# Patient Record
Sex: Female | Born: 1940 | Race: Black or African American | Hispanic: No | Smoking: Former smoker
Health system: Southern US, Community
[De-identification: ages and names within clinical notes are randomized; demographics above are authoritative.]

## PROBLEM LIST (undated history)

## (undated) DIAGNOSIS — N183 Chronic kidney disease, stage 3 unspecified: Secondary | ICD-10-CM

## (undated) DIAGNOSIS — E785 Hyperlipidemia, unspecified: Secondary | ICD-10-CM

## (undated) DIAGNOSIS — R011 Cardiac murmur, unspecified: Secondary | ICD-10-CM

## (undated) DIAGNOSIS — I4891 Unspecified atrial fibrillation: Secondary | ICD-10-CM

## (undated) DIAGNOSIS — Z992 Dependence on renal dialysis: Secondary | ICD-10-CM

## (undated) DIAGNOSIS — J449 Chronic obstructive pulmonary disease, unspecified: Secondary | ICD-10-CM

## (undated) DIAGNOSIS — M1990 Unspecified osteoarthritis, unspecified site: Secondary | ICD-10-CM

## (undated) DIAGNOSIS — N289 Disorder of kidney and ureter, unspecified: Secondary | ICD-10-CM

## (undated) DIAGNOSIS — I10 Essential (primary) hypertension: Secondary | ICD-10-CM

## (undated) DIAGNOSIS — Z87442 Personal history of urinary calculi: Secondary | ICD-10-CM

## (undated) DIAGNOSIS — L409 Psoriasis, unspecified: Secondary | ICD-10-CM

## (undated) DIAGNOSIS — I509 Heart failure, unspecified: Secondary | ICD-10-CM

## (undated) DIAGNOSIS — Z905 Acquired absence of kidney: Secondary | ICD-10-CM

## (undated) DIAGNOSIS — G4730 Sleep apnea, unspecified: Secondary | ICD-10-CM

## (undated) HISTORY — PX: CHOLECYSTECTOMY: SHX55

## (undated) HISTORY — PX: ABDOMINAL HYSTERECTOMY: SHX81

---

## 2000-02-20 ENCOUNTER — Inpatient Hospital Stay (HOSPITAL_COMMUNITY): Admission: EM | Admit: 2000-02-20 | Discharge: 2000-02-21 | Payer: Self-pay | Admitting: Emergency Medicine

## 2000-02-20 ENCOUNTER — Encounter: Payer: Self-pay | Admitting: Emergency Medicine

## 2007-03-27 ENCOUNTER — Encounter (INDEPENDENT_AMBULATORY_CARE_PROVIDER_SITE_OTHER): Payer: Self-pay | Admitting: Internal Medicine

## 2007-03-27 ENCOUNTER — Ambulatory Visit: Payer: Self-pay | Admitting: Family Medicine

## 2007-03-27 LAB — CONVERTED CEMR LAB
ALT: 17 units/L (ref 0–35)
AST: 15 units/L (ref 0–37)
Albumin: 3.7 g/dL (ref 3.5–5.2)
Alkaline Phosphatase: 127 units/L — ABNORMAL HIGH (ref 39–117)
BUN: 16 mg/dL (ref 6–23)
Basophils Absolute: 0 10*3/uL (ref 0.0–0.1)
Basophils Relative: 0 % (ref 0–1)
CO2: 24 meq/L (ref 19–32)
Calcium: 9.2 mg/dL (ref 8.4–10.5)
Chloride: 108 meq/L (ref 96–112)
Cholesterol: 205 mg/dL — ABNORMAL HIGH (ref 0–200)
Creatinine, Ser: 0.85 mg/dL (ref 0.40–1.20)
Eosinophils Absolute: 0.1 10*3/uL (ref 0.0–0.7)
Eosinophils Relative: 1 % (ref 0–5)
Glucose, Bld: 169 mg/dL — ABNORMAL HIGH (ref 70–99)
HCT: 46.9 % — ABNORMAL HIGH (ref 36.0–46.0)
HDL: 79 mg/dL (ref >39)
Hemoglobin: 15 g/dL (ref 12.0–15.0)
LDL Cholesterol: 97 mg/dL (ref 0–99)
Lymphocytes Relative: 21 % (ref 12–46)
Lymphs Abs: 2.1 10*3/uL (ref 0.7–4.0)
MCHC: 32 g/dL (ref 30.0–36.0)
MCV: 92 fL (ref 78.0–100.0)
Monocytes Absolute: 0.5 10*3/uL (ref 0.1–1.0)
Monocytes Relative: 5 % (ref 3–12)
Neutro Abs: 7.1 10*3/uL (ref 1.7–7.7)
Neutrophils Relative %: 72 % (ref 43–77)
Platelets: 302 10*3/uL (ref 150–400)
Potassium: 4.9 meq/L (ref 3.5–5.3)
RBC: 5.1 M/uL (ref 3.87–5.11)
RDW: 15 % (ref 11.5–15.5)
Sodium: 144 meq/L (ref 135–145)
TSH: 0.987 microintl units/mL (ref 0.350–5.50)
Total Bilirubin: 0.6 mg/dL (ref 0.3–1.2)
Total CHOL/HDL Ratio: 2.6
Total Protein: 6.8 g/dL (ref 6.0–8.3)
Triglycerides: 147 mg/dL (ref <150)
VLDL: 29 mg/dL (ref 0–40)
WBC: 9.9 10*3/uL (ref 4.0–10.5)

## 2007-03-28 ENCOUNTER — Ambulatory Visit: Payer: Self-pay | Admitting: *Deleted

## 2007-05-17 ENCOUNTER — Ambulatory Visit: Payer: Self-pay | Admitting: Internal Medicine

## 2007-05-17 LAB — CONVERTED CEMR LAB
ALT: 15 units/L (ref 0–35)
AST: 14 units/L (ref 0–37)
Albumin: 3.6 g/dL (ref 3.5–5.2)
Alkaline Phosphatase: 116 units/L (ref 39–117)
BUN: 12 mg/dL (ref 6–23)
CO2: 25 meq/L (ref 19–32)
Calcium: 9.5 mg/dL (ref 8.4–10.5)
Chloride: 103 meq/L (ref 96–112)
Creatinine, Ser: 0.81 mg/dL (ref 0.40–1.20)
Glucose, Bld: 134 mg/dL — ABNORMAL HIGH (ref 70–99)
Potassium: 4.4 meq/L (ref 3.5–5.3)
Sodium: 143 meq/L (ref 135–145)
Total Bilirubin: 0.6 mg/dL (ref 0.3–1.2)
Total Protein: 7.1 g/dL (ref 6.0–8.3)

## 2007-07-12 ENCOUNTER — Ambulatory Visit: Payer: Self-pay | Admitting: Internal Medicine

## 2007-08-04 ENCOUNTER — Ambulatory Visit: Payer: Self-pay | Admitting: Internal Medicine

## 2007-10-16 ENCOUNTER — Encounter: Payer: Self-pay | Admitting: Family Medicine

## 2007-10-16 ENCOUNTER — Ambulatory Visit: Payer: Self-pay | Admitting: Internal Medicine

## 2007-10-16 LAB — CONVERTED CEMR LAB
ALT: 10 units/L (ref 0–35)
AST: 12 units/L (ref 0–37)
Albumin: 3.3 g/dL — ABNORMAL LOW (ref 3.5–5.2)
Alkaline Phosphatase: 74 units/L (ref 39–117)
BUN: 17 mg/dL (ref 6–23)
CO2: 25 meq/L (ref 19–32)
Calcium: 9.1 mg/dL (ref 8.4–10.5)
Chloride: 106 meq/L (ref 96–112)
Creatinine, Ser: 0.99 mg/dL (ref 0.40–1.20)
Glucose, Bld: 96 mg/dL (ref 70–99)
Potassium: 4.7 meq/L (ref 3.5–5.3)
Sodium: 142 meq/L (ref 135–145)
Total Bilirubin: 0.3 mg/dL (ref 0.3–1.2)
Total Protein: 6.4 g/dL (ref 6.0–8.3)
Uric Acid, Serum: 4.8 mg/dL (ref 2.4–7.0)

## 2007-12-25 ENCOUNTER — Ambulatory Visit: Payer: Self-pay | Admitting: Family Medicine

## 2008-04-09 ENCOUNTER — Ambulatory Visit (HOSPITAL_COMMUNITY): Admission: RE | Admit: 2008-04-09 | Discharge: 2008-04-09 | Payer: Self-pay | Admitting: Cardiology

## 2008-04-09 ENCOUNTER — Encounter (INDEPENDENT_AMBULATORY_CARE_PROVIDER_SITE_OTHER): Payer: Self-pay | Admitting: Cardiology

## 2008-05-10 ENCOUNTER — Encounter: Admission: RE | Admit: 2008-05-10 | Discharge: 2008-05-10 | Payer: Self-pay | Admitting: Cardiology

## 2008-05-16 ENCOUNTER — Inpatient Hospital Stay (HOSPITAL_BASED_OUTPATIENT_CLINIC_OR_DEPARTMENT_OTHER): Admission: RE | Admit: 2008-05-16 | Discharge: 2008-05-16 | Payer: Self-pay | Admitting: Cardiology

## 2009-06-23 ENCOUNTER — Encounter: Admission: RE | Admit: 2009-06-23 | Discharge: 2009-06-23 | Payer: Self-pay | Admitting: Family Medicine

## 2009-09-15 ENCOUNTER — Encounter: Payer: Self-pay | Admitting: Pulmonary Disease

## 2009-09-15 ENCOUNTER — Encounter: Admission: RE | Admit: 2009-09-15 | Discharge: 2009-09-15 | Payer: Self-pay | Admitting: Cardiology

## 2009-10-03 ENCOUNTER — Encounter: Payer: Self-pay | Admitting: Pulmonary Disease

## 2009-10-03 ENCOUNTER — Ambulatory Visit: Payer: Self-pay | Admitting: Internal Medicine

## 2009-10-06 ENCOUNTER — Encounter: Payer: Self-pay | Admitting: Internal Medicine

## 2009-10-23 ENCOUNTER — Telehealth (INDEPENDENT_AMBULATORY_CARE_PROVIDER_SITE_OTHER): Payer: Self-pay | Admitting: *Deleted

## 2009-11-11 DIAGNOSIS — I119 Hypertensive heart disease without heart failure: Secondary | ICD-10-CM | POA: Insufficient documentation

## 2009-11-11 DIAGNOSIS — M109 Gout, unspecified: Secondary | ICD-10-CM | POA: Insufficient documentation

## 2009-11-11 DIAGNOSIS — E669 Obesity, unspecified: Secondary | ICD-10-CM | POA: Insufficient documentation

## 2009-11-11 DIAGNOSIS — Z8719 Personal history of other diseases of the digestive system: Secondary | ICD-10-CM | POA: Insufficient documentation

## 2009-11-12 ENCOUNTER — Ambulatory Visit: Payer: Self-pay | Admitting: Pulmonary Disease

## 2009-11-12 DIAGNOSIS — R0609 Other forms of dyspnea: Secondary | ICD-10-CM | POA: Insufficient documentation

## 2009-11-12 DIAGNOSIS — R0989 Other specified symptoms and signs involving the circulatory and respiratory systems: Secondary | ICD-10-CM | POA: Insufficient documentation

## 2009-11-12 DIAGNOSIS — J449 Chronic obstructive pulmonary disease, unspecified: Secondary | ICD-10-CM | POA: Insufficient documentation

## 2009-12-02 ENCOUNTER — Encounter: Admission: RE | Admit: 2009-12-02 | Discharge: 2009-12-02 | Payer: Self-pay | Admitting: Cardiology

## 2009-12-26 ENCOUNTER — Encounter: Admission: RE | Admit: 2009-12-26 | Discharge: 2009-12-26 | Payer: Self-pay | Admitting: Family Medicine

## 2010-03-15 ENCOUNTER — Encounter: Payer: Self-pay | Admitting: Family Medicine

## 2010-03-24 NOTE — Progress Notes (Signed)
Summary: fax pft's  Phone Note From Other Clinic Call back at [phone]   Caller: Dr. Dineen Kid Call For: young Summary of Call: Fax # [phone] please fax copy of PFT's to [facility] at Dr. Thurman Coyer Office. Initial call taken by: Zigmund Gottron,  October 23, 2009 1:49 PM  Follow-up for Phone Call        Faxed pfts.//Juanita Follow-up by: Netta Neat,  October 24, 2009 8:13 AM

## 2010-03-24 NOTE — Miscellaneous (Signed)
Summary: Orders Update pft charges  Clinical Lists Changes  Orders: Added new Service order of Carbon Monoxide diffusing w/capacity (94720) - Signed Added new Service order of Lung Volumes (94240) - Signed Added new Service order of Spirometry (Pre & Post) (94060) - Signed 

## 2010-03-24 NOTE — Assessment & Plan Note (Signed)
Summary: consult for doe   Visit Type:  Initial Consult Copy to:  Tollie Eth MD Primary Provider/Referring Provider:  Esperanza Richters  CC:  pulmonary consult.Marland Kitchen  History of Present Illness: The pt is a 70y/o female who I have been asked to see for doe.  She has been having increased sob over the past 36mos, and has gotten to the point that she will get winded just walking to her mailbox at home.  She will also get winded bringing her groceries in from the car.  She denies gasping for breath, but rather feels she has a "tiredness in her chest" and "feels heavy".  She denies any significant cough or mucus production, and tells me these symptoms resolved once she stopped smoking.  She has had recent cxr that only shows a linear scar in the lingula.  She has had recent pfts last month that I have reviewed, and reveal a normal FEV1%, but mild obstruction on the basis of airtrapping noted on the lung volumes.  She had mild restriction, and a moderate decrease in DLCO that totally corrects with Av.  The pt has been on advair for years, but has never had pfts in the past.  She has never felt it did much for her.  She has known htn with diastolic dysfunction, and an echo last year showed no pulmonary htn.  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  She denies any LE edema of significance.  She does have significant knee and back issues which limit her mobility quite a bit.  Current Medications (verified): 1)  Advair Diskus 250-50 Mcg/dose Aepb (Fluticasone-Salmeterol) .... 2 Inhalation Two Times A Day 2)  Pravastatin Sodium 80 Mg Tabs (Pravastatin Sodium) .... Once Daily 3)  Lasix 40 Mg Tabs (Furosemide) .Marland Kitchen.. 1 Tab By Mouth Once Daily 4)  Micardis 80 Mg Tabs (Telmisartan) .Marland Kitchen.. 1 Tab By Mouth Once Daily 5)  Spironolactone 25 Mg Tabs (Spironolactone) .Marland Kitchen.. 1 Tab By Mouth Once Daily 6)  Metoprolol Succinate 25 Mg Xr24h-Tab (Metoprolol Succinate) .Marland Kitchen.. 1 Tab By Mouth Once Daily 7)  Amlodipine  Besylate 10 Mg Tabs (Amlodipine Besylate) .... Once Daily 8)  Metformin Hcl 500 Mg Tabs (Metformin Hcl) .... 2 Tab By Mouth Two Times A Day 9)  Meloxicam 15 Mg Tabs (Meloxicam) .... As Needed 10)  Metaxalone 800 Mg Tabs (Metaxalone) .... As Needed 11)  Proair Hfa 108 (90 Base) Mcg/act Aers (Albuterol Sulfate) .... As Needed 12)  Glipizide 2.5 Mg Xr24h-Tab (Glipizide) .... Once Daily  Allergies (verified): 1)  ! 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 DM (ICD-250.00) BEN HTN HEART DISEASE WITHOUT HEART FAIL (ICD-402.10)     Past Surgical History: removal of gallstones  Family History: Reviewed history and no changes required. Theadora Rama cancer--mother  Social History: Reviewed history from 11/11/2009 and no changes required. Patient states former smoker. Quit Jan 2011. 1/2-1 ppd.  Occupation--retired--nurse aide Single lives alone children--2 Currently drinks 1-2 beers a week. use to drink 2-3 beers a day unitl feb 2011.  Review of Systems       The patient complains of shortness of breath with activity, indigestion, loss of appetite, abdominal pain, depression, and hand/feet swelling.  The patient denies shortness of breath at rest, productive cough, non-productive cough, coughing up blood, chest pain, irregular heartbeats, acid heartburn, weight  change, difficulty swallowing, sore throat, tooth/dental problems, headaches, nasal congestion/difficulty breathing through nose, sneezing, itching, ear ache, anxiety, joint stiffness or pain, rash, change in color of mucus, and fever.    Vital Signs:  Patient profile:   70 year old female Height:      62 inches Weight:      189.38 pounds BMI:     34.76 O2 Sat:      99 % on Room air Temp:     98.2 degrees F oral Pulse  rate:   64 / minute BP sitting:   146 / 74  (left arm)  Vitals Entered By: Charma Igo (November 12, 2009 10:02 AM)  O2 Flow:  Room air CC: pulmonary consult. Comments meds and allergies updated Phone number updated Charma Igo  November 12, 2009 10:02 AM    Physical Exam  General:  obese female in nad Eyes:  PERRLA and EOMI.   Nose:  mild septal deviation to left, no purulence or discharge Mouth:  moderate elongation of soft palate and uvula, no exudates or thrush Neck:  no jvd, tmg, LN Lungs:  clear to auscultation, no wheezing or rhonchi Heart:  rrr, no mrg Abdomen:  soft, nontender, bs+ centripetal obesity noted with encroachment on thoracic cage. Extremities:  mild edema on right, no cyanosis  pulses intact distally, but diminished. Neurologic:  alert and oriented, moves all 4.   Impression & Recommendations:  Problem # 1:  COPD, MILD (ICD-496) the pt has very mild copd primarily manifested as airtrapping.  She has been on advair for years, and really hasn't appreciated that it helps.  Since her primary issue on pfts appeared to be airtrapping, would like to use spiriva which targets this pathophysiology well.   If she sees no difference with this, would probably just use as needed albuterol rather than maintenance medications, given the mild nature of her obstruction.  Problem # 2:  DYSPNEA ON EXERTION (ICD-786.09)  the pt has multifactorial dyspnea that is related to her very mild copd, diastolic dysfunction, obesity and conditioning, and finally debility.  She has mild restriction on lung volumes that I think is related to her centripetal obesity given her cxr without evidence for ISLD.  She will need to work on weight loss and conditioning.  It is unclear how much improvement she may have with spiriva, but will try.  Medications Added to Medication List This Visit: 1)  Pravastatin Sodium 80 Mg Tabs (Pravastatin sodium) .... Once daily 2)  Glipizide 2.5 Mg Xr24h-tab  (Glipizide) .... Once daily  Other Orders: Consultation Level IV [id number])  Patient Instructions: 1)  stop advair 2)  start spiriva one inhalation each am 3)  work on weight loss and conditioning. 4)  followup with me in 4 weeks.

## 2010-06-04 LAB — POCT I-STAT GLUCOSE
Glucose, Bld: 105 mg/dL — ABNORMAL HIGH (ref 70–99)
Operator id: 221371

## 2010-07-07 NOTE — Cardiovascular Report (Signed)
NAMEYOSSELYN, STUTE              ACCOUNT NO.:  [id number]   MEDICAL RECORD NO.:  [id number]          PATIENT TYPE:  OIB   LOCATION:  1966                         FACILITY:  [facility]   PHYSICIAN:  Ezzard Standing, M.D.DATE OF BIRTH:  [birth year]   DATE OF PROCEDURE:  DATE OF DISCHARGE:  05/16/2008                            CARDIAC CATHETERIZATION   HISTORY:  A 70 year old female who has a history of diabetes,  hypertensive heart disease, and presented with dyspnea.  Her blood  pressure was poorly controlled.  She had an EKG suggestive of a previous  anterior infarction and in addition had an abnormal Cardiolite with  suggestion of a previous anterior infarction with superior infarction  and ischemia.   PROCEDURE:  Left heart catheterization with coronary angiograms and left  ventriculogram.   DESCRIPTION OF PROCEDURE:  The right femoral artery was entered and the  procedure was done through a 4-French sheath.  Coronary arteries were  visualized with 4-French catheters and a 30 mL ventriculogram was  performed.  She was given labetalol 20 mg IV for control of blood  pressure.  Following the procedure, her sheath was removed in the  holding area.   HEMODYNAMIC DATA:  Aorta postcontrast is 200/85.  LV postcontrast 200/22-  30.   ANGIOGRAPHIC DATA:  Left ventriculogram:  Performed in the 30-degrees  RAO projection.  The aortic valve appears normal.  The mitral valve  appears normal.  The left ventricle has increased trabeculations  consistent with ventricular hypertrophy.  Estimated ejection fraction is  55%.  Coronary arteries arise and distribute normally.  The system is  left dominant with the LAD being small and the right coronary artery  being small and the circumflex being the dominant vessel.  The left main  coronary artery appears short, but appears normal.  The left anterior  descending is a very small vessel and contains 2 small branches and  terminates at the distal  anterior wall and a septal perforator arises  off of it.  There is a small intermediate branch noted.  The circumflex  coronary artery is a dominant vessel.  There is a very large marginal  system which appears to supply the apex.  The vessel contains scattered  irregularities.  The right coronary artery is a small nondominant vessel  and supplies mainly right ventricular branches.   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   RECOMMENDATIONS:  May proceed with surgery from the cardiovascular  viewpoint.  She needs to have very good control of blood pressure.      Ezzard Standing, M.D.  Electronically Signed    WST/MEDQ  D:  05/16/2008  T:  05/17/2008  Job:  [id number]   cc:   Tamika J. Lavonia Drafts, M.D.

## 2010-07-10 NOTE — Discharge Summary (Signed)
[place]  Patient:    Stephanie Woodward, Stephanie Woodward                       MRN: [id number] Adm. Date:  [id number] Disc. Date: [id number] Attending:  Thomes Lolling                           Discharge Summary  DISCHARGE DIAGNOSES: 1. Pancreatitis. 2. Ruled out myocardial infarction. 3. Hypertension. 4. Hypercholesterolemia. 5. Hyperglycemia.  DISCHARGE MEDICATIONS: 1. Prinzide one p.o. q.d. 2. Potassium 20 mEq p.o. q.d.  CONSULTATIONS:  None.  PROCEDURE:  None.  BRIEF HISTORY AND PHYSICAL:  Stephanie Woodward is a 70 year old African-American female with a history of hypertension and diet controlled elevated cholesterol and hyperglycemia who presents with a six hour history of left-sided chest pain and left flank pain.  The patient was at rest while she developed an acute onset of dull left-sided pain under her breasts which radiated to the epigastrium and left flank.  The pain is constant and has lasted for six hours.  It is not associated with nausea, vomiting, or diaphoresis.  She did initially have some shortness of breath but this resolved after several minutes.  However, the pain has persisted.  She has no prior episode of this sort of pain.  She has no known history of coronary disease.  She does report a history of eating fried foods and a lot of rich foods around the holidays, and especially in the last couple of days since she has been visiting her daughter.  There is no family history of coronary disease but she is a smoker of about a half pack a day.  PAST MEDICAL HISTORY: 1. Hypertension x 10 years. 2. Psoriasis. 3. Hypercholesterolemia, diet controlled. 4. Hyperglycemia also diet controlled.  MEDICATIONS:  Prinzide, potassium, ibuprofen p.r.n.  ALLERGIES:  PENICILLIN causes nausea.  FAMILY HISTORY:  Positive for diabetes, negative for hypertension or coronary disease.  Her mother is alive at 1 with colon cancer.  Father died in  32s secondary to complications of diabetes.  SOCIAL HISTORY:  The patient lives in [place], [place] with her husband.  She smokes half a pack a day x 20 years.  Occasional alcohol which would consist of beer or liquor drink on a special occasion.  There is no illicit drug use. She works for the [employer].  REVIEW OF SYSTEMS:  Positive for chills for the last week.  These chills are described as cold spells alternating with sweats that can last for hours. They have been going on for about a week.  There are no weight changes.  She also reports a runny nose for about one day and occasional headache. Otherwise review of systems is noncontributory.  PHYSICAL EXAMINATION:  VITAL SIGNS:  Temperature 98.8, heart rate 66, blood pressure 177/95-on recheck 125/60, respirations 20, 98% on room air.  GENERAL:  She is an overweight African-American female, pleasant, interactive, in no acute distress.  HEENT:  Oropharynx is clear without exudate.  NECK:  No lymphadenopathy.  No thyromegaly.  No carotid bruits.  CARDIOVASCULAR:  Regular rate and rhythm.  No murmurs, rubs, or gallops. There is no JVD.  She has 2+ palpable pulses x 4.  RESPIRATORY:  Clear to auscultation bilaterally except for a slight wheeze at the bilateral bases which is cleared by cough.  GASTROINTESTINAL:  Positive bowel sounds, soft, mildly tender epigastrium without rebound or guarding.  There  is no hepatosplenomegaly, no mass.  EXTREMITIES:  No cyanosis, clubbing, or edema.  She does have psoriatic plaques on her upper and lower extremities.  NEUROLOGICAL:  She is awake and oriented x 3 and nonfocal.  LABORATORIES:  Significant for cardiac enzymes which are negative x 3 except for a mildly elevated CK, the highest of 346 on admission and was 231 on discharge.  However, MB was all within normal range and troponin Is were nondetectable.  Lipase was 62 which was slightly elevated.  Her EKG revealed  normal sinus rhythm with an occasional PAC and an inverted T-wave in V1 as well as left atrial enlargement.  There is no ST elevation or depression.  Chest x-ray reveals cardiac enlargement with some mild vascular congestion.  HOSPITAL COURSE:  Patient was admitted to telemetry, ruled out for MI by enzymes.  There were no EKG changes.  Telemetry revealed normal sinus rhythm. Pain was relieved immediately with Mylanta 30 cc p.o. and we started her on a clear liquid diet.  She tolerated this fine and we advanced her to regular diet.  On day #2 of her admission patient requested to go home to [place], feeling that her episode had resolved.  Since she was tolerating a regular diet without any pain, we felt that her mild case of pancreatitis had resolved and warned her of eating fried or fatty foods and staying away from alcohol. The patient should also avoid any strenuous activity for the next few days. She does have a follow-up appointment already arranged with her primary M.D. which is Dr. Dwain Sarna in [place], [place].  I plan to fax a copy of this discharge summary as well as any pertinent laboratory work which includes her fasting lipid profile which is still pending at the time of this dictation. Patient will follow up with her primary M.D. tomorrow on Monday. DD:  02/21/00 TD:  02/21/00 Job: [id number]

## 2011-01-18 DIAGNOSIS — E119 Type 2 diabetes mellitus without complications: Secondary | ICD-10-CM | POA: Insufficient documentation

## 2011-01-18 DIAGNOSIS — L0201 Cutaneous abscess of face: Secondary | ICD-10-CM | POA: Insufficient documentation

## 2011-01-18 DIAGNOSIS — I10 Essential (primary) hypertension: Secondary | ICD-10-CM | POA: Insufficient documentation

## 2011-01-18 DIAGNOSIS — L03211 Cellulitis of face: Secondary | ICD-10-CM | POA: Insufficient documentation

## 2011-01-19 ENCOUNTER — Emergency Department (HOSPITAL_COMMUNITY)
Admission: EM | Admit: 2011-01-19 | Discharge: 2011-01-19 | Payer: Medicare Other | Attending: Emergency Medicine | Admitting: Emergency Medicine

## 2011-01-19 DIAGNOSIS — L0201 Cutaneous abscess of face: Secondary | ICD-10-CM

## 2011-01-19 HISTORY — DX: Essential (primary) hypertension: I10

## 2011-01-19 HISTORY — DX: Chronic obstructive pulmonary disease, unspecified: J44.9

## 2011-01-19 HISTORY — DX: Disorder of kidney and ureter, unspecified: N28.9

## 2011-01-19 HISTORY — DX: Hyperlipidemia, unspecified: E78.5

## 2011-01-19 MED ORDER — LIDOCAINE HCL 2 % IJ SOLN
INTRAMUSCULAR | Status: AC
  Filled 2011-01-19: qty 1

## 2011-01-19 MED ORDER — CLINDAMYCIN HCL 150 MG PO CAPS: 300.0000 mg | ORAL_CAPSULE | Freq: Four times a day (QID) | ORAL | Status: AC

## 2011-01-19 MED ORDER — LIDOCAINE-EPINEPHRINE 2 %-1:100000 IJ SOLN
20.0000 mL | Freq: Once | INTRAMUSCULAR | Status: AC
  Administered 2011-01-19: 20 mL

## 2011-01-19 MED ORDER — CLINDAMYCIN HCL 300 MG PO CAPS
300.0000 mg | ORAL_CAPSULE | Freq: Once | ORAL | Status: AC
  Administered 2011-01-19: 300 mg via ORAL
  Filled 2011-01-19: qty 1

## 2011-01-19 NOTE — ED Provider Notes (Signed)
History     CSN: [id number] Arrival date & time: 01/19/2011  2:08 AM   First MD Initiated Contact with Patient 01/19/11 [phone]      Chief Complaint  Patient presents with  . Facial Swelling    HPI  History provided by the patient and daughter. Patient presents with acute swelling to left face and lateral eyebrow area over the past 4 days.  Pt states having a chronic small skin "bump" to the area but has never has pain or swelling like she has now.  Area is very TTP.  Pt tried using warm cloth to area but has not had any improvement.  Pt did try to squeeze area without any drainage.  Pt denies vision problems.  She does have hx of cataract surgery in left eye.  Pt denies fever, chills, sweats.     Past Medical History  Diagnosis Date  . Diabetes mellitus   . Hypertension   . Asthma   . COPD (chronic obstructive pulmonary disease)   . Renal disorder   . Gout   . Hyperlipemia     History reviewed. No pertinent past surgical history.  History reviewed. No pertinent family history.  History  Substance Use Topics  . Smoking status: Not on file  . Smokeless tobacco: Not on file  . Alcohol Use: No    OB History    Grav Para Term Preterm Abortions TAB SAB Ect Mult Living                  Review of Systems  Constitutional: Negative for fever and chills.  Neurological: Negative for headaches.  All other systems reviewed and are negative.    Allergies  Penicillins  Home Medications   Current Outpatient Rx  Name Route Sig Dispense Refill  . ALBUTEROL SULFATE HFA 108 (90 BASE) MCG/ACT IN AERS Inhalation Inhale 2 puffs into the lungs every 6 (six) hours as needed.      Marland Kitchen AMLODIPINE BESY-BENAZEPRIL HCL 10-20 MG PO CAPS Oral Take 1 capsule by mouth daily.      . FUROSEMIDE 40 MG PO TABS Oral Take 40 mg by mouth 2 (two) times daily.      Marland Kitchen GLIPIZIDE 5 MG PO TABS Oral Take 5 mg by mouth 2 (two) times daily before a meal.      . METOPROLOL SUCCINATE 25 MG PO TB24 Oral Take  25 mg by mouth daily.      Marland Kitchen PANTOPRAZOLE SODIUM 40 MG PO TBEC Oral Take 40 mg by mouth daily.      Marland Kitchen PRAVASTATIN SODIUM 40 MG PO TABS Oral Take 40 mg by mouth daily.      Marland Kitchen SPIRONOLACTONE 25 MG PO TABS Oral Take 25 mg by mouth daily.      . TELMISARTAN 80 MG PO TABS Oral Take 80 mg by mouth daily.      Marland Kitchen TIOTROPIUM BROMIDE MONOHYDRATE 18 MCG IN CAPS Inhalation Place 18 mcg into inhaler and inhale daily.      Marland Kitchen ZOLPIDEM TARTRATE 10 MG PO TABS Oral Take 10 mg by mouth at bedtime as needed.        BP 138/58  Pulse 56  Temp 98.3 F (36.8 C)  Resp 20  SpO2 98%  Physical Exam  Nursing note and vitals reviewed. Constitutional: She is oriented to person, place, and time. She appears well-developed and well-nourished. No distress.  HENT:  Head: Normocephalic.  Eyes: Conjunctivae and EOM are normal. Pupils are equal, round, and  reactive to light.  Neck: Normal range of motion. Neck supple.  Cardiovascular: Regular rhythm and normal heart sounds.   Pulmonary/Chest: Effort normal and breath sounds normal.  Lymphadenopathy:    She has no cervical adenopathy.  Neurological: She is alert and oriented to person, place, and time. No cranial nerve deficit.  Skin: Skin is warm and dry.       2 cm fluctuant nodule over left face just lateral to eyebrow and temple area.  Nodule is TTP.  There is no drainage or bleeding.  Psychiatric: She has a normal mood and affect. Her behavior is normal.    ED Course  Procedures (including critical care time)  INCISION AND DRAINAGE Performed by: Martie Lee Consent: Verbal consent obtained. Risks and benefits: risks, benefits and alternatives were discussed Type: abscess  Body area: Left face  Anesthesia: local infiltration  Local anesthetic: lidocaine 2 % with epinephrine  Anesthetic total: 1.5 ml  Drainage: purulent  Drainage amount: Moderate   Patient tolerance: Patient tolerated the procedure well with no immediate complications.    1.  Abscess of face      MDM  3:45 AM patient seen and evaluated. Patient no acute distress.  Seen and discussed with attending physician. Will attempt I&D.  Pt given strict return instructions and recheck of infection in 24-48 hours.        Martie Lee, PA 01/19/11 1911

## 2011-01-19 NOTE — ED Notes (Signed)
Pt states that she's had a bump over her eye for several years, this weekend it began to get larger, sore, and has little drainage

## 2011-01-20 NOTE — ED Provider Notes (Signed)
Medical screening examination/treatment/procedure(s) were conducted as a shared visit with non-physician practitioner(s) and myself.  I personally evaluated the patient during the encounter  The patient has developed a small abscess in her left lateral eyebrow.  I was able to express frank pus from this at the bedside.  Incision and drainage by my physician assistant.  Home in good condition.  Hoy Morn, MD 01/20/11 0500

## 2012-11-16 ENCOUNTER — Ambulatory Visit
Admission: RE | Admit: 2012-11-16 | Discharge: 2012-11-16 | Disposition: A | Discharge status: Home / Self Care | Payer: Medicare Other | Source: Ambulatory Visit | Attending: Family Medicine | Admitting: Family Medicine

## 2012-11-16 ENCOUNTER — Other Ambulatory Visit: Payer: Self-pay | Admitting: Family Medicine

## 2012-11-16 DIAGNOSIS — R609 Edema, unspecified: Secondary | ICD-10-CM

## 2013-01-31 ENCOUNTER — Ambulatory Visit
Admission: RE | Admit: 2013-01-31 | Discharge: 2013-01-31 | Disposition: A | Discharge status: Home / Self Care | Payer: Medicare Other | Source: Ambulatory Visit | Attending: Cardiology | Admitting: Cardiology

## 2013-01-31 ENCOUNTER — Other Ambulatory Visit: Payer: Self-pay | Admitting: Cardiology

## 2013-01-31 DIAGNOSIS — R0602 Shortness of breath: Secondary | ICD-10-CM

## 2013-04-02 ENCOUNTER — Other Ambulatory Visit: Payer: Self-pay | Admitting: Family Medicine

## 2013-04-02 DIAGNOSIS — R609 Edema, unspecified: Secondary | ICD-10-CM

## 2013-04-09 ENCOUNTER — Ambulatory Visit
Admission: RE | Admit: 2013-04-09 | Discharge: 2013-04-09 | Disposition: A | Discharge status: Home / Self Care | Payer: Medicare Other | Source: Ambulatory Visit | Attending: Family Medicine | Admitting: Family Medicine

## 2013-04-09 DIAGNOSIS — R609 Edema, unspecified: Secondary | ICD-10-CM

## 2013-07-05 ENCOUNTER — Other Ambulatory Visit (HOSPITAL_COMMUNITY): Payer: Self-pay

## 2013-07-05 DIAGNOSIS — J441 Chronic obstructive pulmonary disease with (acute) exacerbation: Secondary | ICD-10-CM

## 2013-07-12 ENCOUNTER — Ambulatory Visit (HOSPITAL_COMMUNITY)
Admission: RE | Admit: 2013-07-12 | Discharge: 2013-07-12 | Disposition: A | Discharge status: Home / Self Care | Payer: Medicare Other | Source: Ambulatory Visit | Attending: Family Medicine | Admitting: Family Medicine

## 2013-07-12 DIAGNOSIS — J441 Chronic obstructive pulmonary disease with (acute) exacerbation: Secondary | ICD-10-CM | POA: Insufficient documentation

## 2013-07-12 MED ORDER — ALBUTEROL SULFATE (2.5 MG/3ML) 0.083% IN NEBU
2.5000 mg | INHALATION_SOLUTION | Freq: Once | RESPIRATORY_TRACT | Status: AC
  Administered 2013-07-12: 2.5 mg via RESPIRATORY_TRACT

## 2013-07-30 ENCOUNTER — Other Ambulatory Visit: Payer: Self-pay | Admitting: Cardiology

## 2013-07-30 ENCOUNTER — Ambulatory Visit
Admission: RE | Admit: 2013-07-30 | Discharge: 2013-07-30 | Disposition: A | Discharge status: Home / Self Care | Payer: Medicare Other | Source: Ambulatory Visit | Attending: Cardiology | Admitting: Cardiology

## 2013-07-30 DIAGNOSIS — R0602 Shortness of breath: Secondary | ICD-10-CM

## 2013-08-06 LAB — PULMONARY FUNCTION TEST
DL/VA % pred: 88 %
DL/VA: 4.01 ml/min/mmHg/L
DLCO unc % pred: 28 %
DLCO unc: 6.17 ml/min/mmHg
FEF 25-75 Post: 0.89 L/sec
FEF 25-75 Pre: 0.51 L/sec
FEF2575-%Change-Post: 74 %
FEF2575-%Pred-Post: 57 %
FEF2575-%Pred-Pre: 33 %
FEV1-%Change-Post: 12 %
FEV1-%Pred-Post: 57 %
FEV1-%Pred-Pre: 51 %
FEV1-Post: 0.95 L
FEV1-Pre: 0.85 L
FEV1FVC-%Change-Post: 6 %
FEV1FVC-%Pred-Pre: 93 %
FEV6-%Change-Post: 6 %
FEV6-%Pred-Post: 61 %
FEV6-%Pred-Pre: 57 %
FEV6-Post: 1.25 L
FEV6-Pre: 1.18 L
FEV6FVC-%Change-Post: 0 %
FEV6FVC-%Pred-Post: 104 %
FEV6FVC-%Pred-Pre: 103 %
FVC-%Change-Post: 6 %
FVC-%Pred-Post: 58 %
FVC-%Pred-Pre: 55 %
FVC-Post: 1.25 L
FVC-Pre: 1.18 L
Post FEV1/FVC ratio: 76 %
Post FEV6/FVC ratio: 100 %
Pre FEV1/FVC ratio: 72 %
Pre FEV6/FVC Ratio: 100 %
RV % pred: 100 %
RV: 2.12 L
TLC % pred: 72 %
TLC: 3.44 L

## 2013-10-15 ENCOUNTER — Encounter (HOSPITAL_COMMUNITY): Payer: Self-pay | Admitting: Pharmacy Technician

## 2013-10-16 ENCOUNTER — Ambulatory Visit
Admission: RE | Admit: 2013-10-16 | Discharge: 2013-10-16 | Disposition: A | Discharge status: Home / Self Care | Payer: Medicare Other | Source: Ambulatory Visit | Attending: Cardiology | Admitting: Cardiology

## 2013-10-16 ENCOUNTER — Other Ambulatory Visit: Payer: Self-pay | Admitting: Cardiology

## 2013-10-16 DIAGNOSIS — R0602 Shortness of breath: Secondary | ICD-10-CM

## 2013-10-16 NOTE — H&P (Signed)
Stephanie Woodward    Date of visit:  10/16/2013 DOB:  1941-01-31    Age:  73 yrs. Medical record number:  [id number]     Account number:  [id number] Primary Care Provider: Donnie Coffin ____________________________ CURRENT DIAGNOSES  1. Dyspnea  2. Chest pain, other  3. Pre-Op Cardiovascular Exam  4. Congestive Heart Failure Chronic Diastolic  5. Diabetes Mellitus-NIDD  6. Hypertensive Heart Disease-Benign without CHF  7. Obesity(BMI30-40)  8. COPD  9. Chronic Kidney Disease (Stage 3)  10. Hyperlipidemia, unspecified  11. Gout Unspecified ____________________________ ALLERGIES  Penicillins, Intolerance-unknown ____________________________ MEDICATIONS  1. Micardis 80 mg Tablet, 1 p.o. daily  2. spironolactone 25 mg Tablet, 1 p.o. daily  3. metoprolol succinate 25 mg Tablet Sustained Release 24 hr, 1 p.o. daily  4. ProAir HFA 90 mcg/Actuation HFA Aerosol Inhaler, PRN  5. furosemide 40 mg Tablet, 1 p.o. daily  6. amlodipine 10 mg Tablet, 1 p.o. daily  7. zolpidem 10 mg tablet, hs prn  8. pantoprazole 40 mg tablet,delayed release (DR/EC), 1 p.o. daily  9. promethazine 25 mg tablet, PRN  10. glipizide 10 mg tablet extended release 24hr, 1 p.o. daily  11. gabapentin 100 mg capsule, tid  12. Onglyza 5 mg tablet, 1 p.o. daily  13. latanoprost 0.005 % drops, 1 gtt ou qhs  14. ketorolac 0.4 % drops, 1 gtt ou bid  15. atorvastatin 80 mg tablet, 1 p.o. daily  16. Advair Diskus 250 mcg-50 mcg/dose powder for inhalation, BID  17. metformin 500 mg tablet, 1 p.o. daily  18. tramadol 50 mg tablet, PRN  19. montelukast 10 mg tablet, PRN  20. ondansetron HCl 8 mg tablet, PRN  21. Bactrim DS 800 mg-160 mg tablet, BID  22. Levemir FlexTouch 100 unit/mL (3 mL) subcutaneous insulin pen, 66u qd ____________________________ CHIEF COMPLAINTS  Followup of Chest pain, other  Pre cath visit ____________________________ HISTORY OF PRESENT ILLNESS Patient seen for precath evaluation. The patient had a  catheterization 5 years ago that showed a diminutive LAD and right coronary artery and no significant circumflex disease. She is diabetic and hypertensive and more recently has developed progressive dyspnea with exertion as well as midsternal chest discomfort that will stop her activities. She has gained additional weight also. She saw a previous pulmonologist in 2011 and did have recent pulmonary function testings this year. The previous myocardial perfusion scan was abnormal and an echocardiogram done in December showed ejection fraction of around 50%. A recent BNP was low. It was not felt that noninvasive evaluation would be helpful in light of the previous testing and because of her progressive symptoms she is brought in this time for right and left heart catheterization in order to help determine causes of dyspnea and progressive onset of recent chest pain. She does have stage III chronic kidney disease. ____________________________ PAST HISTORY  Past Medical Illnesses:  hypertension, DM-non-insulin dependent, obesity, history of pancreatitis, COPD, gout, history of H. Pylori infection, hyperlipidemia, chronic kidney disease Stage 3;  Cardiovascular Illnesses:  no previous history of cardiac disease;  Surgical Procedures:  cholecystectomy, hysterectomy;  Cardiology Procedures-Invasive:  cardiac cath (left) March 2010;  Cardiology Procedures-Noninvasive:  echocardiogram February 2010, adenosine cardiolite March 2010, lexiscan cardiolite July 2011, echocardiogram August 2011, echocardiogram December 2014;  Cardiac Cath Results:  no significant disease;  LVEF of 50% documented via echocardiogram on 02/07/2013,   ____________________________ CARDIO-PULMONARY TEST DATES EKG Date:  10/12/2013;   Cardiac Cath Date:  05/16/2008;  Nuclear Study Date:  09/18/2009;  Echocardiography Date: 02/07/2013;  Chest Xray Date: 10/16/2013;   ____________________________ FAMILY HISTORY Brother -- Brother dead,  Cancer Father -- Father dead, CVA Mother -- Mother dead, Cancer Sister -- Sister alive and well Sister -- Sister alive and well Sister -- Sister alive and well Sister -- Sister alive and well Sister -- Sister alive and well ____________________________ SOCIAL HISTORY Alcohol Use:  does not use alcohol;  Smoking:  used to smoke but quit, 20 pack year history;  Diet:  regular diet;  Lifestyle:  widowed;  Exercise:  no regular exercise;  Occupation:  retired Surveyor, minerals, later Oncologist;  Residence:  lives alone;   ____________________________ REVIEW OF SYSTEMS General:  malaise and fatigue  Integumentary:no rashes or new skin lesions. Eyes: denies diplopia, history of glaucoma or visual problems. Ears, Nose, Throat, Mouth:  denies any hearing loss, epistaxis, hoarseness or difficulty speaking. Respiratory: severe dyspnea with exertion Cardiovascular:  please review HPI Abdominal: denies dyspepsia, GI bleeding, constipation, or diarrhea Musculoskeletal:  arthritis of the legs, arthritis of her shoulder, chronic back pain Neurological:  denies headaches, stroke, or TIA Psychiatric:  depression  ____________________________ PHYSICAL EXAMINATION VITAL SIGNS  Blood Pressure:  164/70 Sitting, Left arm, regular cuff  , 170/70 Standing, Right arm and regular cuff   Pulse:  80/min. Weight:  201.00 lbs. Height:  63"BMI: 35  Constitutional:  pleasant African American female in no acute distress walks with walker, moderately obese Skin:  warm and dry to touch, no apparent skin lesions, or masses noted. Head:  normocephalic, normal hair pattern, no masses or tenderness Eyes:  EOMS Intact, PERRLA, C and S clear, Funduscopic exam not done. ENT:  ears, nose and throat reveal no gross abnormalities.  Dentition good. Neck:  supple, without massess. No JVD, thyromegaly or carotid bruits. Carotid upstroke normal. Chest:  normal symmetry, clear to auscultation Cardiac:  regular rhythm, normal S1 and S2, No  S3 or S4, no murmurs, gallops or rubs detected. Abdomen:  abdomen soft,non-tender, no masses, no hepatospenomegaly, or aneurysm noted Peripheral Pulses:  the femoral,dorsalis pedis, and posterior tibial pulses are full and equal bilaterally with no bruits auscultated. Extremities & Back:  no deformities, clubbing, cyanosis, erythema or edema observed. Normal muscle strength and tone. Neurological:  no gross motor or sensory deficits noted, affect appropriate, oriented x3. ____________________________ MOST RECENT LIPID PANEL 10/05/13  CHOL TOTL 169 mg/dl, LDL 91 NM, HDL 56 mg/dl, TRIGLYCER 113 mg/dl and CHOL/HDL 3.0 (Calc) ____________________________ IMPRESSIONS/PLAN  1. Progressive dyspnea and chest discomfort in a patient with multiple cardiovascular risk factors 2. Hypertensive heart disease 3. Obesity with weight gain 4. COPD 5. Stage III chronic kidney disease 6. LBBB  Recommendations:  Cardiac catheterization was discussed with the patient including risks of myocardial infarction, death, stroke, bleeding, arrhythmia, dye allergy, or renal insufficiency. She understands and is willing to proceed. We will hold her diuretics and ARB the day of the procedure. Try to minimize contrast to avoid contrast nephropathy. Plan right and left heart catheterization. ____________________________ TODAYS ORDERS  1. Comprehensive Metabolic Panel: Today  2. Complete Blood Count: Today  3. Draw PT/INR: Today  4. PTT: Today  5. CHEST XRAY: Today  6. Chest X-ray PA/Lat: today                       ____________________________ Cardiology Physician:  Kerry Hough MD [facility]

## 2013-10-17 ENCOUNTER — Telehealth: Payer: Self-pay | Admitting: Physician Assistant

## 2013-10-17 NOTE — Telephone Encounter (Signed)
Patient called after-hours with pharmacy question. States she was told she has high potassium and was supposed to pick up a medicine at the pharmacy, but the pharmacy said they did not receive any med requests. Dr. Wynonia Lawman is on a different medical record system so I called him to clarify. He reports it was sent in earlier so it's not clear why the pharmacy did not receive the rx. I clarified with Dr. Wynonia Lawman that the prescription is for kayexalate 30gm po x 1 (zero refills). I spoke with the patient to inform her of the dose instructions and also called this into her pharmacy on behalf of Dr. Wynonia Lawman. Her usual [facility] does not carry it but the pharmacist said they are actively looking to locate the medicine locally for the patient and will help her find it tonight. Stephanie Woodward was informed of this plan and she will be calling the pharmacy back shortly to find out where she can pick it up. Dayna Dunn PA-C

## 2013-10-18 ENCOUNTER — Encounter (HOSPITAL_COMMUNITY): Admission: RE | Payer: Self-pay | Source: Ambulatory Visit

## 2013-10-18 ENCOUNTER — Ambulatory Visit (HOSPITAL_COMMUNITY): Admission: RE | Admit: 2013-10-18 | Payer: Medicare Other | Source: Ambulatory Visit | Admitting: Cardiology

## 2013-10-18 SURGERY — LEFT AND RIGHT HEART CATHETERIZATION WITH CORONARY ANGIOGRAM
Anesthesia: LOCAL

## 2013-11-27 NOTE — H&P (Signed)
Stephanie Woodward    Date of visit:  11/27/2013 DOB:  May 27, 1940    Age:  73 yrs. Medical record number:  [id number]     Account number:  [id number] Primary Care Provider: Donnie Coffin ____________________________ CURRENT DIAGNOSES  1. Shortness of breath  2. Encounter for preprocedural cardiovascular examination  3. Chronic diastolic (congestive) heart failure  4. Type 2 diabetes mellitus without complications  5. Hypertensive heart disease without heart failure  6. Other obesity  7. Chronic obstructive pulmonary disease, unspecified  8. Chronic kidney disease, stage 3 (moderate)  9. Gout, unspecified  10. Hyperlipidemia, unspecified ____________________________ ALLERGIES  Penicillins, Intolerance-unknown ____________________________ MEDICATIONS  1. metoprolol succinate 25 mg Tablet Sustained Release 24 hr, 1 p.o. daily  2. ProAir HFA 90 mcg/Actuation HFA Aerosol Inhaler, PRN  3. furosemide 40 mg Tablet, 1 p.o. daily  4. amlodipine 10 mg Tablet, 1 p.o. daily  5. zolpidem 10 mg tablet, hs prn  6. pantoprazole 40 mg tablet,delayed release (DR/EC), 1 p.o. daily  7. promethazine 25 mg tablet, PRN  8. glipizide 10 mg tablet extended release 24hr, 1 p.o. daily  9. gabapentin 100 mg capsule, tid  10. Onglyza 5 mg tablet, 1 p.o. daily  11. latanoprost 0.005 % drops, 1 gtt ou qhs  12. ketorolac 0.4 % drops, 1 gtt ou bid  13. atorvastatin 80 mg tablet, 1 p.o. daily  14. Advair Diskus 250 mcg-50 mcg/dose powder for inhalation, BID  15. metformin 500 mg tablet, 1 p.o. daily  16. tramadol 50 mg tablet, PRN  17. montelukast 10 mg tablet, PRN  18. ondansetron HCl 8 mg tablet, PRN  19. Levemir FlexTouch 100 unit/mL (3 mL) subcutaneous insulin pen, 66u qd  20. Kayexalate oral powder, 30 GM take now ____________________________ CHIEF COMPLAINTS  pre cath eval ____________________________ HISTORY OF PRESENT ILLNESS Patient seen for precath evaluation. The patient had a catheterization 5  years ago that showed a diminutive LAD and right coronary artery and no significant circumflex disease. She is diabetic and hypertensive and more recently has developed progressive dyspnea with exertion as well as midsternal chest discomfort that will stop her activities. She has gained additional weight also. She saw a previous pulmonologist in 2011 and did have recent pulmonary function testings this year. The previous myocardial perfusion scan was abnormal and an echocardiogram done in December showed ejection fraction of around 50%. A recent BNP was low. It was not felt that noninvasive evaluation would be helpful in light of the previous testing and because of her progressive symptoms she is brought in this time for right and left heart catheterization in order to help determine causes of dyspnea and progressive onset of recent chest pain. She does have stage III chronic kidney disease.  She was originally scheduled to have a catheterization the end of August but was found to have significant elevation of creatinine prior to surgery. Since that time she was taken off of her 80s or ARB as well as spironolactone and has had improvement in her renal function. She was seen by nephrology but has continued to have significant limitations because of dyspnea on exertion. She also had significant chest discomfort. She is brought in at this time for right and left heart catheterization her to assess her severe limiting dyspnea. ____________________________ PAST HISTORY  Past Medical Illnesses:  hypertension, DM-non-insulin dependent, obesity, history of pancreatitis, COPD, gout, history of H. Pylori infection, hyperlipidemia, chronic kidney disease Stage 3;  Cardiovascular Illnesses:  no previous history of cardiac disease;  Surgical  Procedures:  cholecystectomy, hysterectomy;  Cardiology Procedures-Invasive:  cardiac cath (left) March 2010;  Cardiology Procedures-Noninvasive:  echocardiogram February 2010,  adenosine cardiolite March 2010, lexiscan cardiolite July 2011, echocardiogram August 2011, echocardiogram December 2014;  Cardiac Cath Results:  no significant disease;  LVEF of 50% documented via echocardiogram on 02/07/2013,   ____________________________ CARDIO-PULMONARY TEST DATES EKG Date:  10/12/2013;   Cardiac Cath Date:  05/16/2008;  Nuclear Study Date:  09/18/2009;  Echocardiography Date: 02/07/2013;  Chest Xray Date: 10/16/2013;   ____________________________ FAMILY HISTORY Brother -- Brother dead, Cancer Father -- Father dead, CVA Mother -- Mother dead, Cancer Sister -- Sister alive and well Sister -- Sister alive and well Sister -- Sister alive and well Sister -- Sister alive and well Sister -- Sister alive and well ____________________________ SOCIAL HISTORY Alcohol Use:  does not use alcohol;  Smoking:  used to smoke but quit, 20 pack year history;  Diet:  regular diet;  Lifestyle:  widowed;  Exercise:  no regular exercise;  Occupation:  retired Surveyor, minerals, later Oncologist;  Residence:  lives alone;   ____________________________ REVIEW OF SYSTEMS General:  malaise and fatigue  Integumentary:no rashes or new skin lesions. Eyes: denies diplopia, history of glaucoma or visual problems. Ears, Nose, Throat, Mouth:  denies any hearing loss, epistaxis, hoarseness or difficulty speaking. Respiratory: severe dyspnea with exertion Cardiovascular:  please review HPI Abdominal: denies dyspepsia, GI bleeding, constipation, or diarrhea Musculoskeletal:  arthritis of the legs, arthritis of her shoulder, chronic back pain Neurological:  denies headaches, stroke, or TIA Psychiatric:  depression  ____________________________ PHYSICAL EXAMINATION VITAL SIGNS  Blood Pressure:  140/70 Standing, Left arm, regular cuff  , 150/79 Sitting, Left arm and regular cuff   Pulse:  67/min. Weight:  203.00 lbs. Height:  63"BMI: 36  Constitutional:  pleasant African American female in no acute  distress, moderately obese walks with cane Skin:  warm and dry to touch, no apparent skin lesions, or masses noted. Head:  normocephalic, normal hair pattern, no masses or tenderness Eyes:  EOMS Intact, PERRLA, C and S clear, Funduscopic exam not done. ENT:  ears, nose and throat reveal no gross abnormalities.  Dentition good. Neck:  supple, without massess. No JVD, thyromegaly or carotid bruits. Carotid upstroke normal. Chest:  normal symmetry, clear to auscultation Cardiac:  regular rhythm, normal S1 and S2, No S3 or S4, no murmurs, gallops or rubs detected. Abdomen:  abdomen soft,non-tender, no masses, no hepatospenomegaly, or aneurysm noted Peripheral Pulses:  the femoral,dorsalis pedis, and posterior tibial pulses are full and equal bilaterally with no bruits auscultated. Extremities & Back:  no deformities, clubbing, cyanosis, erythema or edema observed. Normal muscle strength and tone. Neurological:  no gross motor or sensory deficits noted, affect appropriate, oriented x3. ____________________________ MOST RECENT LIPID PANEL 10/05/13  CHOL TOTL 169 mg/dl, LDL 91 NM, HDL 56 mg/dl, TRIGLYCER 113 mg/dl and CHOL/HDL 3.0 (Calc) ____________________________ IMPRESSIONS/PLAN  1. Severe exertional dyspnea and progressive chest discomfort 2. Stage III chronic kidney disease 3. Chronic diastolic heart failure 4. Hypertensive heart disease 5. Diabetes mellitus with renal complications 6. Obesity with weight gain  Recommendations:  Cardiac catheterization was discussed with the patient including risks of myocardial infarction, death, stroke, bleeding, arrhythmia, dye allergy, or renal insufficiency. He understands and is willing to proceed. We explained to her because of her renal insufficiency but likely would start with a catheterization and then decide where to go from there in terms of her dyspnea. Obtain lab as noted below. Catheterization on  Thursday.  ____________________________ TODAYS ORDERS  1. Comprehensive Metabolic Panel: Today  2. Complete Blood Count: Today  3. Draw PT/INR: Today  4. PTT: Today  5. Cardiac Cath Right and Left on Thursday                       ____________________________ Cardiology Physician:  Kerry Hough MD [facility]

## 2013-11-29 ENCOUNTER — Encounter (HOSPITAL_COMMUNITY)
Admission: RE | Disposition: A | Discharge status: Home / Self Care | Payer: Self-pay | Source: Ambulatory Visit | Attending: Cardiology

## 2013-11-29 ENCOUNTER — Ambulatory Visit (HOSPITAL_COMMUNITY)
Admission: RE | Admit: 2013-11-29 | Discharge: 2013-11-29 | Disposition: A | Discharge status: Home / Self Care | Payer: Medicare Other | Source: Ambulatory Visit | Attending: Cardiology | Admitting: Cardiology

## 2013-11-29 DIAGNOSIS — Z7951 Long term (current) use of inhaled steroids: Secondary | ICD-10-CM | POA: Diagnosis not present

## 2013-11-29 DIAGNOSIS — I5032 Chronic diastolic (congestive) heart failure: Secondary | ICD-10-CM | POA: Insufficient documentation

## 2013-11-29 DIAGNOSIS — Z794 Long term (current) use of insulin: Secondary | ICD-10-CM | POA: Insufficient documentation

## 2013-11-29 DIAGNOSIS — Z79891 Long term (current) use of opiate analgesic: Secondary | ICD-10-CM | POA: Diagnosis not present

## 2013-11-29 DIAGNOSIS — N183 Chronic kidney disease, stage 3 (moderate): Secondary | ICD-10-CM | POA: Insufficient documentation

## 2013-11-29 DIAGNOSIS — I272 Other secondary pulmonary hypertension: Secondary | ICD-10-CM | POA: Diagnosis not present

## 2013-11-29 DIAGNOSIS — E119 Type 2 diabetes mellitus without complications: Secondary | ICD-10-CM | POA: Diagnosis not present

## 2013-11-29 DIAGNOSIS — E785 Hyperlipidemia, unspecified: Secondary | ICD-10-CM | POA: Insufficient documentation

## 2013-11-29 DIAGNOSIS — E669 Obesity, unspecified: Secondary | ICD-10-CM | POA: Insufficient documentation

## 2013-11-29 DIAGNOSIS — M109 Gout, unspecified: Secondary | ICD-10-CM | POA: Insufficient documentation

## 2013-11-29 DIAGNOSIS — I447 Left bundle-branch block, unspecified: Secondary | ICD-10-CM | POA: Diagnosis not present

## 2013-11-29 DIAGNOSIS — Z6836 Body mass index (BMI) 36.0-36.9, adult: Secondary | ICD-10-CM | POA: Diagnosis not present

## 2013-11-29 DIAGNOSIS — J449 Chronic obstructive pulmonary disease, unspecified: Secondary | ICD-10-CM | POA: Diagnosis not present

## 2013-11-29 DIAGNOSIS — I130 Hypertensive heart and chronic kidney disease with heart failure and stage 1 through stage 4 chronic kidney disease, or unspecified chronic kidney disease: Secondary | ICD-10-CM | POA: Insufficient documentation

## 2013-11-29 DIAGNOSIS — R0600 Dyspnea, unspecified: Secondary | ICD-10-CM | POA: Diagnosis present

## 2013-11-29 HISTORY — PX: LEFT AND RIGHT HEART CATHETERIZATION WITH CORONARY ANGIOGRAM: SHX5449

## 2013-11-29 LAB — GLUCOSE, CAPILLARY
Glucose-Capillary: 84 mg/dL (ref 70–99)
Glucose-Capillary: 74 mg/dL (ref 70–99)

## 2013-11-29 LAB — POCT I-STAT 3, VENOUS BLOOD GAS (G3P V)
Bicarbonate: 25.9 mEq/L — ABNORMAL HIGH (ref 20.0–24.0)
O2 Saturation: 56 %
TCO2: 27 mmol/L (ref 0–100)
pCO2, Ven: 44.5 mmHg — ABNORMAL LOW (ref 45.0–50.0)
pH, Ven: 7.372 — ABNORMAL HIGH (ref 7.250–7.300)
pO2, Ven: 30 mmHg (ref 30.0–45.0)

## 2013-11-29 LAB — POCT I-STAT 3, ART BLOOD GAS (G3+)
Bicarbonate: 24.8 mEq/L — ABNORMAL HIGH (ref 20.0–24.0)
O2 Saturation: 90 %
TCO2: 26 mmol/L (ref 0–100)
pCO2 arterial: 39.1 mmHg (ref 35.0–45.0)
pH, Arterial: 7.41 (ref 7.350–7.450)
pO2, Arterial: 58 mmHg — ABNORMAL LOW (ref 80.0–100.0)

## 2013-11-29 SURGERY — LEFT AND RIGHT HEART CATHETERIZATION WITH CORONARY ANGIOGRAM
Anesthesia: LOCAL

## 2013-11-29 MED ORDER — TRAMADOL HCL 50 MG PO TABS: 50.0000 mg | ORAL_TABLET | Freq: Two times a day (BID) | ORAL | Status: DC

## 2013-11-29 MED ORDER — HEPARIN (PORCINE) IN NACL 2-0.9 UNIT/ML-% IJ SOLN
INTRAMUSCULAR | Status: AC
  Filled 2013-11-29: qty 500

## 2013-11-29 MED ORDER — LINAGLIPTIN 5 MG PO TABS: 5.0000 mg | ORAL_TABLET | Freq: Every day | ORAL | Status: DC

## 2013-11-29 MED ORDER — METOPROLOL SUCCINATE ER 25 MG PO TB24: 25.0000 mg | ORAL_TABLET | Freq: Every day | ORAL | Status: DC

## 2013-11-29 MED ORDER — INSULIN DETEMIR 100 UNIT/ML ~~LOC~~ SOLN: 66.0000 [IU] | Freq: Every day | SUBCUTANEOUS | Status: DC

## 2013-11-29 MED ORDER — LATANOPROST 0.005 % OP SOLN: 1.0000 [drp] | Freq: Every day | OPHTHALMIC | Status: DC

## 2013-11-29 MED ORDER — SODIUM CHLORIDE 0.9 % IV SOLN: 1.0000 mL/kg/h | INTRAVENOUS | Status: DC

## 2013-11-29 MED ORDER — LIDOCAINE HCL (PF) 1 % IJ SOLN
INTRAMUSCULAR | Status: AC
  Filled 2013-11-29: qty 30

## 2013-11-29 MED ORDER — TIOTROPIUM BROMIDE MONOHYDRATE 18 MCG IN CAPS: 18.0000 ug | ORAL_CAPSULE | Freq: Every day | RESPIRATORY_TRACT | Status: DC

## 2013-11-29 MED ORDER — ALBUTEROL SULFATE HFA 108 (90 BASE) MCG/ACT IN AERS: 2.0000 | INHALATION_SPRAY | RESPIRATORY_TRACT | Status: DC | PRN

## 2013-11-29 MED ORDER — SODIUM CHLORIDE 0.9 % IV SOLN: INTRAVENOUS | Status: DC

## 2013-11-29 MED ORDER — SODIUM CHLORIDE 0.9 % IJ SOLN: 3.0000 mL | INTRAMUSCULAR | Status: DC | PRN

## 2013-11-29 MED ORDER — ATORVASTATIN CALCIUM 80 MG PO TABS: 80.0000 mg | ORAL_TABLET | Freq: Every day | ORAL | Status: DC

## 2013-11-29 MED ORDER — SODIUM CHLORIDE 0.9 % IV SOLN: 250.0000 mL | INTRAVENOUS | Status: DC | PRN

## 2013-11-29 MED ORDER — SODIUM CHLORIDE 0.9 % IJ SOLN: 3.0000 mL | Freq: Two times a day (BID) | INTRAMUSCULAR | Status: DC

## 2013-11-29 MED ORDER — PANTOPRAZOLE SODIUM 40 MG PO TBEC: 40.0000 mg | DELAYED_RELEASE_TABLET | Freq: Every day | ORAL | Status: DC

## 2013-11-29 MED ORDER — ACETAMINOPHEN 325 MG PO TABS
650.0000 mg | ORAL_TABLET | ORAL | Status: DC | PRN
  Administered 2013-11-29: 650 mg via ORAL

## 2013-11-29 MED ORDER — ONDANSETRON HCL 4 MG/2ML IJ SOLN: 4.0000 mg | Freq: Four times a day (QID) | INTRAMUSCULAR | Status: DC | PRN

## 2013-11-29 MED ORDER — AMLODIPINE BESYLATE 5 MG PO TABS: 5.0000 mg | ORAL_TABLET | Freq: Every day | ORAL | Status: DC

## 2013-11-29 MED ORDER — ACETAMINOPHEN 325 MG PO TABS
ORAL_TABLET | ORAL | Status: AC
  Filled 2013-11-29: qty 2

## 2013-11-29 MED ORDER — ASPIRIN 81 MG PO CHEW
81.0000 mg | CHEWABLE_TABLET | ORAL | Status: AC
  Administered 2013-11-29: 81 mg via ORAL

## 2013-11-29 MED ORDER — ASPIRIN 81 MG PO CHEW
CHEWABLE_TABLET | ORAL | Status: AC
  Filled 2013-11-29: qty 1

## 2013-11-29 MED ORDER — NITROGLYCERIN 1 MG/10 ML FOR IR/CATH LAB
INTRA_ARTERIAL | Status: AC
  Filled 2013-11-29: qty 10

## 2013-11-29 NOTE — CV Procedure (Signed)
CARDIAC CATHETERIZATION REPORT   Stephanie Woodward  DOB:  Jul 06, 1940  MRN:  [id number] 11/29/2013  PROCEDURE:  Right and left heart catheterization with selective coronary angiography, left ventriculogram.  INDICATIONS: Severe ongoing dyspnea in a patient with left bundle branch block  The risks, benefits, and details of the procedure were explained to the patient.  The patient verbalized understanding and wanted to proceed.  Informed written consent was obtained.  PROCEDURE TECHNIQUE:  Blood vessel access was obtained with difficulty. Dr. Shelva Majestic assisted the procedure was able to obtain access to the right femoral vein and artery. I then performed the remainder of the case. After Xylocaine anesthesia a 61F sheath was placed in the right femoral vein. Right heart pressures were measured with a Swan-Ganz catheter, pulmonary artery saturation was measured, and thermodilution cardiac outputs were done.  A 67F sheath was then  placed in the right femoral artery with a single anterior needle wall stick.   Left coronary angiography was done using a Judkins left 3.5 guide catheter.  Right coronary angiography was done using a Judkins R4 guide catheter.  Left ventriculography was done using a pigtail catheter. The sheath was removed in the holding area.  The patient tolerated the procedure well.   CONTRAST:  Total of 55 cc cc.  COMPLICATIONS:  None.    HEMODYNAMICS:   Right atrium:           A= 15    V= 15  Right Ventricle:       50-60/20 Pulmonary Artery:   50-60/20 mean = 34               Sat= 56% PCWP:                   Mean = 31-significant respiratory variation Aorta                      127/67               Sat= 90%       LV                           127/15-25    There was no gradient between the left ventricle and aorta.    ANGIOGRAPHIC DATA:    CORONARY ARTERIES:   Arise and distribute normally.  Left dominant. No coronary calcification is  noted.  Left main coronary artery: Normal  Left anterior descending: Diminutive LAD with a very LAD the ends on the distal anterior wall. The vessel appears to be diffusely narrowed and either has spasm or is diminutive in origin. A septal perforator arises  Circumflex coronary artery: Very large vessel. The marginal branch is wraparound and supplied the apex. The vessel is dominant.  Right coronary artery: Nondominant vessel supplies mainly right ventricular branches  LEFT VENTRICULOGRAM:  Performed in the 30 RAO projection.  The aortic and mitral valves are normal. The left ventricle appears to have left ventricular hypertrophy and appears to have diffuse hypokinesis with an estimated ejection fraction of around 45%.   IMPRESSIONS:  1. Left dominant system with coronary artery flow supplied predominantly through the circumflex. There is no significant obstructive stenoses noted in the circumflex. Diminutive LAD and nondominant right noted 2. 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 Moderate pulmonary hypertension  RECOMMENDATION:  Evaluate for other causes of dyspnea. Continue to treat with diuresis and good control of blood  pressure.   Kerry Hough MD [facility]

## 2013-11-29 NOTE — Progress Notes (Signed)
Resting quietly in bed , no acute distress. No chest pain no sob alert oriented. C/o ha 6/10 tylenol 650 po given. Venous/aterial sheaths intact. Removed at [id number] without complication.

## 2013-11-29 NOTE — Discharge Instructions (Signed)

## 2013-11-29 NOTE — Interval H&P Note (Signed)
History and Physical Interval Note:  11/29/2013 8:15 AM    Patient seen and examined.  No interval change in history and exam since last note.  Stable for procedure.  Kerry Hough. MD [facility]  11/29/2013

## 2013-11-29 NOTE — Progress Notes (Signed)
Pressure removed from cath sites, no obvious hematoma, no bleeding, pedal pulse present. Hob at 20, advised pt to maintain position and to keep rle immobilized until instructed otherwise. Gauze bandage placed covered with opsite. Report called to short stay. Transported from ha in no acute distress.

## 2013-12-05 ENCOUNTER — Inpatient Hospital Stay (HOSPITAL_COMMUNITY)
Admission: EM | Admit: 2013-12-05 | Discharge: 2013-12-07 | DRG: 293 | Disposition: A | Discharge status: Home / Self Care | Payer: Medicare Other | Attending: Cardiology | Admitting: Cardiology

## 2013-12-05 ENCOUNTER — Encounter (HOSPITAL_COMMUNITY): Payer: Self-pay | Admitting: Emergency Medicine

## 2013-12-05 ENCOUNTER — Emergency Department (HOSPITAL_COMMUNITY): Payer: Medicare Other

## 2013-12-05 DIAGNOSIS — R0602 Shortness of breath: Secondary | ICD-10-CM | POA: Diagnosis present

## 2013-12-05 DIAGNOSIS — I5022 Chronic systolic (congestive) heart failure: Secondary | ICD-10-CM | POA: Diagnosis present

## 2013-12-05 DIAGNOSIS — I129 Hypertensive chronic kidney disease with stage 1 through stage 4 chronic kidney disease, or unspecified chronic kidney disease: Secondary | ICD-10-CM | POA: Diagnosis present

## 2013-12-05 DIAGNOSIS — E1122 Type 2 diabetes mellitus with diabetic chronic kidney disease: Secondary | ICD-10-CM | POA: Diagnosis present

## 2013-12-05 DIAGNOSIS — I5042 Chronic combined systolic (congestive) and diastolic (congestive) heart failure: Secondary | ICD-10-CM | POA: Diagnosis present

## 2013-12-05 DIAGNOSIS — I272 Other secondary pulmonary hypertension: Secondary | ICD-10-CM | POA: Diagnosis present

## 2013-12-05 DIAGNOSIS — E785 Hyperlipidemia, unspecified: Secondary | ICD-10-CM | POA: Diagnosis present

## 2013-12-05 DIAGNOSIS — I2510 Atherosclerotic heart disease of native coronary artery without angina pectoris: Secondary | ICD-10-CM | POA: Diagnosis present

## 2013-12-05 DIAGNOSIS — M1990 Unspecified osteoarthritis, unspecified site: Secondary | ICD-10-CM | POA: Diagnosis present

## 2013-12-05 DIAGNOSIS — M109 Gout, unspecified: Secondary | ICD-10-CM | POA: Diagnosis present

## 2013-12-05 DIAGNOSIS — Z794 Long term (current) use of insulin: Secondary | ICD-10-CM

## 2013-12-05 DIAGNOSIS — Z87891 Personal history of nicotine dependence: Secondary | ICD-10-CM

## 2013-12-05 DIAGNOSIS — I5043 Acute on chronic combined systolic (congestive) and diastolic (congestive) heart failure: Principal | ICD-10-CM | POA: Diagnosis present

## 2013-12-05 DIAGNOSIS — E669 Obesity, unspecified: Secondary | ICD-10-CM

## 2013-12-05 DIAGNOSIS — D72829 Elevated white blood cell count, unspecified: Secondary | ICD-10-CM | POA: Diagnosis present

## 2013-12-05 DIAGNOSIS — E11649 Type 2 diabetes mellitus with hypoglycemia without coma: Secondary | ICD-10-CM | POA: Diagnosis present

## 2013-12-05 DIAGNOSIS — G8929 Other chronic pain: Secondary | ICD-10-CM | POA: Diagnosis present

## 2013-12-05 DIAGNOSIS — N183 Chronic kidney disease, stage 3 (moderate): Secondary | ICD-10-CM | POA: Diagnosis present

## 2013-12-05 DIAGNOSIS — E1129 Type 2 diabetes mellitus with other diabetic kidney complication: Secondary | ICD-10-CM | POA: Diagnosis present

## 2013-12-05 DIAGNOSIS — I5020 Unspecified systolic (congestive) heart failure: Secondary | ICD-10-CM

## 2013-12-05 DIAGNOSIS — Z6837 Body mass index (BMI) 37.0-37.9, adult: Secondary | ICD-10-CM

## 2013-12-05 DIAGNOSIS — R635 Abnormal weight gain: Secondary | ICD-10-CM | POA: Diagnosis present

## 2013-12-05 DIAGNOSIS — J984 Other disorders of lung: Secondary | ICD-10-CM | POA: Diagnosis present

## 2013-12-05 DIAGNOSIS — J45909 Unspecified asthma, uncomplicated: Secondary | ICD-10-CM | POA: Diagnosis present

## 2013-12-05 DIAGNOSIS — J449 Chronic obstructive pulmonary disease, unspecified: Secondary | ICD-10-CM | POA: Diagnosis present

## 2013-12-05 DIAGNOSIS — R0902 Hypoxemia: Secondary | ICD-10-CM | POA: Diagnosis present

## 2013-12-05 DIAGNOSIS — Z7951 Long term (current) use of inhaled steroids: Secondary | ICD-10-CM

## 2013-12-05 DIAGNOSIS — I509 Heart failure, unspecified: Secondary | ICD-10-CM

## 2013-12-05 DIAGNOSIS — I5033 Acute on chronic diastolic (congestive) heart failure: Secondary | ICD-10-CM | POA: Diagnosis present

## 2013-12-05 HISTORY — DX: Acquired absence of kidney: Z90.5

## 2013-12-05 HISTORY — DX: Psoriasis, unspecified: L40.9

## 2013-12-05 LAB — URINALYSIS, ROUTINE W REFLEX MICROSCOPIC
Bilirubin Urine: NEGATIVE
Glucose, UA: NEGATIVE mg/dL
Ketones, ur: NEGATIVE mg/dL
Nitrite: NEGATIVE
Protein, ur: 100 mg/dL — AB
Specific Gravity, Urine: 1.007 (ref 1.005–1.030)
Urobilinogen, UA: 0.2 mg/dL (ref 0.0–1.0)
pH: 7 (ref 5.0–8.0)

## 2013-12-05 LAB — GLUCOSE, CAPILLARY: Glucose-Capillary: 237 mg/dL — ABNORMAL HIGH (ref 70–99)

## 2013-12-05 LAB — BASIC METABOLIC PANEL
Anion gap: 12 (ref 5–15)
BUN: 23 mg/dL (ref 6–23)
CO2: 29 mEq/L (ref 19–32)
Calcium: 9.6 mg/dL (ref 8.4–10.5)
Chloride: 105 mEq/L (ref 96–112)
Creatinine, Ser: 1.63 mg/dL — ABNORMAL HIGH (ref 0.50–1.10)
GFR calc Af Amer: 36 mL/min — ABNORMAL LOW (ref >90)
GFR calc non Af Amer: 31 mL/min — ABNORMAL LOW (ref >90)
Glucose, Bld: 69 mg/dL — ABNORMAL LOW (ref 70–99)
Potassium: 4.4 mEq/L (ref 3.7–5.3)
Sodium: 146 mEq/L (ref 137–147)

## 2013-12-05 LAB — CBC
HCT: 38.2 % (ref 36.0–46.0)
HCT: 36.3 % (ref 36.0–46.0)
Hemoglobin: 11.9 g/dL — ABNORMAL LOW (ref 12.0–15.0)
Hemoglobin: 11.6 g/dL — ABNORMAL LOW (ref 12.0–15.0)
MCH: 25.4 pg — ABNORMAL LOW (ref 26.0–34.0)
MCH: 26 pg (ref 26.0–34.0)
MCHC: 31.2 g/dL (ref 30.0–36.0)
MCHC: 32 g/dL (ref 30.0–36.0)
MCV: 81.6 fL (ref 78.0–100.0)
MCV: 81.2 fL (ref 78.0–100.0)
Platelets: 394 10*3/uL (ref 150–400)
Platelets: 372 10*3/uL (ref 150–400)
RBC: 4.68 MIL/uL (ref 3.87–5.11)
RBC: 4.47 MIL/uL (ref 3.87–5.11)
RDW: 15.3 % (ref 11.5–15.5)
RDW: 15.5 % (ref 11.5–15.5)
WBC: 13.2 10*3/uL — ABNORMAL HIGH (ref 4.0–10.5)
WBC: 13.1 10*3/uL — ABNORMAL HIGH (ref 4.0–10.5)

## 2013-12-05 LAB — URINE MICROSCOPIC-ADD ON

## 2013-12-05 LAB — CREATININE, SERUM
Creatinine, Ser: 1.7 mg/dL — ABNORMAL HIGH (ref 0.50–1.10)
GFR calc Af Amer: 34 mL/min — ABNORMAL LOW (ref >90)
GFR calc non Af Amer: 29 mL/min — ABNORMAL LOW (ref >90)

## 2013-12-05 LAB — TSH: TSH: 2.37 u[IU]/mL (ref 0.350–4.500)

## 2013-12-05 LAB — I-STAT TROPONIN, ED: Troponin i, poc: 0.03 ng/mL (ref 0.00–0.08)

## 2013-12-05 LAB — PRO B NATRIURETIC PEPTIDE: Pro B Natriuretic peptide (BNP): 868.9 pg/mL — ABNORMAL HIGH (ref 0–125)

## 2013-12-05 LAB — CBG MONITORING, ED
Glucose-Capillary: 47 mg/dL — ABNORMAL LOW (ref 70–99)
Glucose-Capillary: 180 mg/dL — ABNORMAL HIGH (ref 70–99)

## 2013-12-05 LAB — TROPONIN I: Troponin I: <0.3 ng/mL (ref <0.30)

## 2013-12-05 MED ORDER — METFORMIN HCL 500 MG PO TABS: 500.0000 mg | ORAL_TABLET | Freq: Every day | ORAL | Status: DC

## 2013-12-05 MED ORDER — FUROSEMIDE 10 MG/ML IJ SOLN
40.0000 mg | Freq: Once | INTRAMUSCULAR | Status: AC
Start: 2013-12-05 — End: 2013-12-05
  Administered 2013-12-05: 40 mg via INTRAVENOUS
  Filled 2013-12-05: qty 4

## 2013-12-05 MED ORDER — ATORVASTATIN CALCIUM 80 MG PO TABS
80.0000 mg | ORAL_TABLET | Freq: Every day | ORAL | Status: DC
  Administered 2013-12-06 – 2013-12-07 (×2): 80 mg via ORAL
  Filled 2013-12-05 (×2): qty 1

## 2013-12-05 MED ORDER — SODIUM CHLORIDE 0.9 % IJ SOLN
3.0000 mL | Freq: Two times a day (BID) | INTRAMUSCULAR | Status: DC
  Administered 2013-12-05 – 2013-12-07 (×4): 3 mL via INTRAVENOUS

## 2013-12-05 MED ORDER — TIOTROPIUM BROMIDE MONOHYDRATE 18 MCG IN CAPS
18.0000 ug | ORAL_CAPSULE | Freq: Every day | RESPIRATORY_TRACT | Status: DC
  Administered 2013-12-06: 18 ug via RESPIRATORY_TRACT
  Filled 2013-12-05: qty 5

## 2013-12-05 MED ORDER — MOMETASONE FURO-FORMOTEROL FUM 100-5 MCG/ACT IN AERO
2.0000 | INHALATION_SPRAY | Freq: Two times a day (BID) | RESPIRATORY_TRACT | Status: DC
  Administered 2013-12-06 – 2013-12-07 (×3): 2 via RESPIRATORY_TRACT
  Filled 2013-12-05: qty 8.8

## 2013-12-05 MED ORDER — TRAMADOL HCL 50 MG PO TABS
50.0000 mg | ORAL_TABLET | Freq: Two times a day (BID) | ORAL | Status: DC
  Administered 2013-12-05 – 2013-12-07 (×4): 50 mg via ORAL
  Filled 2013-12-05 (×4): qty 1

## 2013-12-05 MED ORDER — ACETAMINOPHEN 325 MG PO TABS: 650.0000 mg | ORAL_TABLET | ORAL | Status: DC | PRN

## 2013-12-05 MED ORDER — HEPARIN SODIUM (PORCINE) 5000 UNIT/ML IJ SOLN
5000.0000 [IU] | Freq: Three times a day (TID) | INTRAMUSCULAR | Status: DC
  Administered 2013-12-05 – 2013-12-07 (×6): 5000 [IU] via SUBCUTANEOUS
  Filled 2013-12-05 (×7): qty 1

## 2013-12-05 MED ORDER — SODIUM CHLORIDE 0.9 % IV SOLN: 250.0000 mL | INTRAVENOUS | Status: DC | PRN

## 2013-12-05 MED ORDER — ALBUTEROL SULFATE HFA 108 (90 BASE) MCG/ACT IN AERS: 2.0000 | INHALATION_SPRAY | RESPIRATORY_TRACT | Status: DC | PRN

## 2013-12-05 MED ORDER — PANTOPRAZOLE SODIUM 40 MG PO TBEC
40.0000 mg | DELAYED_RELEASE_TABLET | Freq: Every day | ORAL | Status: DC
  Administered 2013-12-06 – 2013-12-07 (×2): 40 mg via ORAL
  Filled 2013-12-05 (×2): qty 1

## 2013-12-05 MED ORDER — SODIUM CHLORIDE 0.9 % IJ SOLN: 3.0000 mL | INTRAMUSCULAR | Status: DC | PRN

## 2013-12-05 MED ORDER — FUROSEMIDE 10 MG/ML IJ SOLN
40.0000 mg | Freq: Two times a day (BID) | INTRAMUSCULAR | Status: DC
  Administered 2013-12-06 – 2013-12-07 (×3): 40 mg via INTRAVENOUS
  Filled 2013-12-05 (×6): qty 4

## 2013-12-05 MED ORDER — ZOLPIDEM TARTRATE 5 MG PO TABS: 5.0000 mg | ORAL_TABLET | Freq: Every evening | ORAL | Status: DC | PRN

## 2013-12-05 MED ORDER — METOPROLOL SUCCINATE ER 25 MG PO TB24
25.0000 mg | ORAL_TABLET | Freq: Every day | ORAL | Status: DC
  Administered 2013-12-06 – 2013-12-07 (×2): 25 mg via ORAL
  Filled 2013-12-05 (×2): qty 1

## 2013-12-05 MED ORDER — ALBUTEROL SULFATE (2.5 MG/3ML) 0.083% IN NEBU: 2.5000 mg | INHALATION_SOLUTION | RESPIRATORY_TRACT | Status: DC | PRN

## 2013-12-05 MED ORDER — ZOLPIDEM TARTRATE 5 MG PO TABS: 10.0000 mg | ORAL_TABLET | Freq: Every evening | ORAL | Status: DC | PRN

## 2013-12-05 MED ORDER — ONDANSETRON HCL 4 MG/2ML IJ SOLN
4.0000 mg | Freq: Four times a day (QID) | INTRAMUSCULAR | Status: DC | PRN
Start: 2013-12-05 — End: 2013-12-07

## 2013-12-05 MED ORDER — LATANOPROST 0.005 % OP SOLN
1.0000 [drp] | Freq: Every day | OPHTHALMIC | Status: DC
  Administered 2013-12-05 – 2013-12-06 (×2): 1 [drp] via OPHTHALMIC
  Filled 2013-12-05: qty 2.5

## 2013-12-05 MED ORDER — INSULIN DETEMIR 100 UNIT/ML ~~LOC~~ SOLN
66.0000 [IU] | Freq: Every day | SUBCUTANEOUS | Status: DC
Start: 2013-12-06 — End: 2013-12-07
  Administered 2013-12-06 – 2013-12-07 (×2): 66 [IU] via SUBCUTANEOUS
  Filled 2013-12-05 (×2): qty 0.66

## 2013-12-05 MED ORDER — AMLODIPINE BESYLATE 5 MG PO TABS
5.0000 mg | ORAL_TABLET | Freq: Every day | ORAL | Status: DC
Start: 2013-12-06 — End: 2013-12-07
  Administered 2013-12-06 – 2013-12-07 (×2): 5 mg via ORAL
  Filled 2013-12-05 (×2): qty 1

## 2013-12-05 MED ORDER — LINAGLIPTIN 5 MG PO TABS
5.0000 mg | ORAL_TABLET | Freq: Every day | ORAL | Status: DC
  Administered 2013-12-06 – 2013-12-07 (×2): 5 mg via ORAL
  Filled 2013-12-05 (×2): qty 1

## 2013-12-05 NOTE — Care Management Note (Addendum)
    Page 1 of 2   12/07/2013     5:02:02 PM CARE MANAGEMENT NOTE 12/07/2013  Patient:  Stephanie Woodward, Stephanie Woodward   Account Number:  [id number]  Date Initiated:  12/05/2013  Documentation initiated by:  Mariann Laster  Subjective/Objective Assessment:   CHF     Action/Plan:   CM to follow for disposition needs   Anticipated DC Date:  12/08/2013   Anticipated DC Plan:  HOME/SELF CARE         Choice offered to / List presented to:  C-1 Patient   DME arranged  Vassie Moselle      DME agency  [place] arranged  HH-1 RN  [place] [facility]      [facility] agency  [facility]   Status of service:  Completed, signed off Medicare Important Message given?   (If response is "NO", the following Medicare IM given date fields will be blank) Date Medicare IM given:   Medicare IM given by:   Date Additional Medicare IM given:   Additional Medicare IM given by:    Discharge Disposition:  [place]  Per UR Regulation:  Reviewed for med. necessity/level of care/duration of stay  If discussed at [place] of Stay Meetings, dates discussed:    Comments:  Ikran Patman J. Clydene Laming, RN, BSN, [employer] [phone] Spoke with pt at bedside regarding discharge planning for [facility]. Offered pt list of home health agencies to choose from.  Pt chose [facility] to render services. Christa See, RN of [facility] notified.  DME needs identified at this time include rollator.  Pt chose [facility] to provide rollator.  Pilar Plate of [facility] notified to deliver to pt room prior to d/c home.  Crystal Hutchinson RN, BSN, MSHL, CCM  Nurse - Case Manager,  (Unit [place])  [phone]  12/05/2013

## 2013-12-05 NOTE — ED Notes (Signed)
Pt CBG 47. MD, RN notified.

## 2013-12-05 NOTE — Progress Notes (Signed)
UR completed Jaylise Peek K. Cuyler Vandyken, RN, BSN, [place], CCM  12/05/2013 8:50 PM

## 2013-12-05 NOTE — H&P (Signed)
Patient ID: Stephanie Woodward MRN: [id number], DOB/AGE: 08-01-1940   Admit date: 12/05/2013   Primary Physician: Donnie Coffin, MD Primary Cardiologist: Dr. Wynonia Lawman   Pt. Profile:  Stephanie Woodward is a 73 y.o. female with a history of DM, HTN, COPD, HLD, gout, and chronic dyspnea who presents today with worsening SOB.   She was recently admitted for a L/RHC on 11/27/13 for evaluation of progressive dyspnea with exertion and midsternal chest discomfort. This revealed:   HEMODYNAMICS:  Right atrium: A= 15 V= 15  Right Ventricle: 50-60/20  Pulmonary Artery: 50-60/20 mean = 34 Sat= 56%  PCWP: Mean = 31-significant respiratory variation  Aorta 127/67 Sat= 90%  LV 127/15-25   LEFT VENTRICULOGRAM: Performed in the 30 RAO projection. The aortic and mitral valves are normal. The left ventricle appears to have left ventricular hypertrophy and appears to have diffuse hypokinesis with an estimated ejection fraction of around 45%.  IMPRESSIONS:  1. Left dominant system with coronary artery flow supplied predominantly through the circumflex. There is no significant obstructive stenoses noted in the circumflex. Diminutive LAD and nondominant right noted 2. 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 Moderate pulmonary hypertension  RECOMMENDATION: Evaluate for other causes of dyspnea. Continue to treat with diuresis and good control of blood pressure.  She now presents with worsening SOB and cough despite compliance with home Lasix, which have gotten progressively worse since her cardiac catheterization last week. She also notes PND and orthopnea. No LE edema. She denies salt indescretion; although she has not been watching her fluid intake. Her weight appears to be stable from her last admission at 203;bs. Her cough is slightly productive with white sputum. She gets some mild CP when she has a coughing spell. No lightheadedness or dizziness or syncope. No palpations. She has also  noted some fevers this AM up to 100 deg F. Now resolved. She chronically feels fatigued.   Problem List  Past Medical History  Diagnosis Date  . Diabetes mellitus   . Hypertension   . Asthma   . COPD (chronic obstructive pulmonary disease)   . Renal disorder   . Gout   . Hyperlipemia     History reviewed. No pertinent past surgical history.   Allergies  Allergies  Allergen Reactions  . Eggs Or Egg-Derived Products Nausea And Vomiting  . Lisinopril   . Penicillins Nausea And Vomiting     Home Medications  Prior to Admission medications   Medication Sig Start Date End Date Taking? Authorizing Provider  albuterol (PROVENTIL HFA;VENTOLIN HFA) 108 (90 BASE) MCG/ACT inhaler Inhale 2 puffs into the lungs every 4 (four) hours as needed for shortness of breath.    Yes Historical Provider, MD  amLODipine (NORVASC) 10 MG tablet Take 5 mg by mouth daily.   Yes Historical Provider, MD  atorvastatin (LIPITOR) 80 MG tablet Take 80 mg by mouth daily.   Yes Historical Provider, MD  clobetasol cream (TEMOVATE) AB-123456789 % Apply 1 application topically 2 (two) times daily.   Yes Historical Provider, MD  fluticasone (CUTIVATE) 0.05 % cream Apply 1 application topically 2 (two) times daily.    Yes Historical Provider, MD  Fluticasone-Salmeterol (ADVAIR) 250-50 MCG/DOSE AEPB Inhale 1 puff into the lungs 2 (two) times daily.   Yes Historical Provider, MD  furosemide (LASIX) 40 MG tablet Take 40 mg by mouth daily.    Yes Historical Provider, MD  insulin detemir (LEVEMIR) 100 UNIT/ML injection Inject 66 Units into the skin daily.  Yes Historical Provider, MD  latanoprost (XALATAN) 0.005 % ophthalmic solution Place 1 drop into both eyes at bedtime.   Yes Historical Provider, MD  metFORMIN (GLUCOPHAGE) 500 MG tablet Take 500 mg by mouth daily.   Yes Historical Provider, MD  metoprolol succinate (TOPROL-XL) 25 MG 24 hr tablet Take 25 mg by mouth daily.    Yes Historical Provider, MD  pantoprazole (PROTONIX)  40 MG tablet Take 40 mg by mouth daily.    Yes Historical Provider, MD  saxagliptin HCl (ONGLYZA) 5 MG TABS tablet Take 5 mg by mouth daily.   Yes Historical Provider, MD  tiotropium (SPIRIVA) 18 MCG inhalation capsule Place 18 mcg into inhaler and inhale daily.    Yes Historical Provider, MD  traMADol (ULTRAM) 50 MG tablet Take 50 mg by mouth 2 (two) times daily.   Yes Historical Provider, MD  zolpidem (AMBIEN) 10 MG tablet Take 10 mg by mouth daily as needed.    Yes Historical Provider, MD    Family History  History reviewed. No pertinent family history. No family status information on file.     Social History  History   Social History  . Marital Status: Widowed    Spouse Name: N/A    Number of Children: N/A  . Years of Education: N/A   Occupational History  . Not on file.   Social History Main Topics  . Smoking status: Not on file  . Smokeless tobacco: Not on file  . Alcohol Use: No  . Drug Use:   . Sexual Activity:    Other Topics Concern  . Not on file   Social History Narrative  . No narrative on file     All other systems reviewed and are otherwise negative except as noted above.  Physical Exam  Blood pressure 146/61, pulse 84, temperature 98.6 F (37 C), temperature source Oral, resp. rate 21, height 5' 2.5" (1.588 m), weight 203 lb (92.08 kg), SpO2 98.00%.  General: Pleasant, NAD Psych: Normal affect. Neuro: Alert and oriented X 3. Moves all extremities spontaneously. HEENT: Normal  Neck: Supple without bruits or + JVD. Lungs:  Resp regular and unlabored, CTA. Heart: RRR no s3, s4, or murmurs. Abdomen: Soft, non-tender, non-distended, BS + x 4. Abdominal distention Extremities: No clubbing, cyanosis or edema. DP/PT/Radials 2+ and equal bilaterally. Trace edema  Labs  No results found for this basename: CKTOTAL, CKMB, TROPONINI,  in the last 72 hours Lab Results  Component Value Date   WBC 13.2* 12/05/2013   HGB 11.9* 12/05/2013   HCT 38.2  12/05/2013   MCV 81.6 12/05/2013   PLT 394 12/05/2013    Recent Labs Lab 12/05/13 1527  NA 146  K 4.4  CL 105  CO2 29  BUN 23  CREATININE 1.63*  CALCIUM 9.6  GLUCOSE 69*   Lab Results  Component Value Date   CHOL 205* 03/27/2007   HDL 79 03/27/2007   LDLCALC 97 03/27/2007   TRIG 147 03/27/2007      Radiology/Studies  Dg Chest 2 View  12/05/2013   CLINICAL DATA:  Short of breath. Productive cough for 6 months. COPD.  EXAM: CHEST  2 VIEW  COMPARISON:  10/16/2013.  FINDINGS: Cardiomegaly is present. Basilar atelectasis and airspace disease is present with small bilateral pleural effusions. Cholecystectomy clips are present in the right upper quadrant. Thickening of the fissures is present.  IMPRESSION: Mild to moderate CHF.   Electronically Signed   By: Dereck Ligas M.D.   On: 12/05/2013  16:54    ECG  Sinus rhythm with Premature supraventricular complexes Left axis deviation Low voltage QRS Inferior infarct , age undetermined  ASSESSMENT AND PLAN  Arnika Byerley is a 73 y.o. female with a history of DM, HTN, COPD, HLD, gout, and chronic dyspnea who presents today with worsening SOB and cough.   Acute on chronic systolic CHF- EF [id number] by LV gram on cath with high LVEDP -- BNP 868 and CXR with CHF -- Will give IV Lasix 40 mg BID  -- Strict I/Os and daily weights.  -- Will do 2D ECHO to confirm cardiomyopathy and further assess LV and RV -- Continue Toprol XL. No ACE/ARB due to CKD  Cough - mild leukocytosis and reported fevers at home. Afebrile here. -- No PNA on CXR -- Continue to monitor  HTN- continue amlodipine 5mg  and toprol xl 25mg    HLD- cont statin  CKD- creat 1.63 -- Monitor in the setting of diuresis -- Followed by Dr. Veronda Prude. No ACE or ARB  DM- Continue home meds  COPD- continue home meds    Signed, Eileen Stanford, PA-C 12/05/2013, 7:13 PM  Pager [phone]

## 2013-12-05 NOTE — ED Notes (Addendum)
Pt reports having sob for extended amount of time. Has been to cardiologist for same and had negative cardiac cath last week. Reports increase in sob. No acute distress noted at triage, spo2 93% at triage. Having non productive cough, hx of copd.

## 2013-12-05 NOTE — ED Provider Notes (Signed)
I have personally seen and examined the patient.  I have discussed the plan of care with the resident.  I have reviewed the documentation on PMH/FH/Soc. History.  I have reviewed the documentation of the resident and agree.   Sharyon Cable, MD 12/05/13 [phone]

## 2013-12-05 NOTE — ED Provider Notes (Signed)
CSN: [id number]     Arrival date & time 12/05/13  1508 History   First MD Initiated Contact with Patient 12/05/13 1608     Chief Complaint  Patient presents with  . Shortness of Breath     (Consider location/radiation/quality/duration/timing/severity/associated sxs/prior Treatment) Patient is a 73 y.o. female presenting with shortness of breath.  Shortness of Breath Severity:  Moderate Onset quality:  Gradual Duration:  2 months Timing:  Intermittent Progression:  Worsening Chronicity:  New Context: activity   Relieved by:  Nothing Worsened by:  Activity Ineffective treatments:  Inhaler Associated symptoms: cough   Associated symptoms: no abdominal pain, no chest pain, no diaphoresis, no fever, no headaches, no hemoptysis, no rash, no sore throat, no sputum production, no syncope, no vomiting and no wheezing   Risk factors: no hx of PE/DVT     Past Medical History  Diagnosis Date  . Diabetes mellitus   . Hypertension   . Asthma   . COPD (chronic obstructive pulmonary disease)   . Renal disorder   . Gout   . Hyperlipemia    History reviewed. No pertinent past surgical history. History reviewed. No pertinent family history. History  Substance Use Topics  . Smoking status: Not on file  . Smokeless tobacco: Not on file  . Alcohol Use: No   OB History   Grav Para Term Preterm Abortions TAB SAB Ect Mult Living                 Review of Systems  Constitutional: Negative for fever, chills and diaphoresis.  HENT: Negative for congestion and sore throat.   Eyes: Negative for visual disturbance.  Respiratory: Positive for cough and shortness of breath. Negative for hemoptysis, sputum production and wheezing.   Cardiovascular: Positive for leg swelling. Negative for chest pain and syncope.  Gastrointestinal: Negative for nausea, vomiting, abdominal pain, diarrhea and constipation.  Genitourinary: Negative for dysuria, difficulty urinating and vaginal pain.   Musculoskeletal: Negative for arthralgias and myalgias.  Skin: Negative for rash.  Neurological: Negative for syncope and headaches.  Psychiatric/Behavioral: Negative for behavioral problems.  All other systems reviewed and are negative.     Allergies  Eggs or egg-derived products; Lisinopril; and Penicillins  Home Medications   Prior to Admission medications   Medication Sig Start Date End Date Taking? Authorizing Provider  albuterol (PROVENTIL HFA;VENTOLIN HFA) 108 (90 BASE) MCG/ACT inhaler Inhale 2 puffs into the lungs every 4 (four) hours as needed for shortness of breath.    Yes Historical Provider, MD  amLODipine (NORVASC) 10 MG tablet Take 5 mg by mouth daily.   Yes Historical Provider, MD  atorvastatin (LIPITOR) 80 MG tablet Take 80 mg by mouth daily.   Yes Historical Provider, MD  clobetasol cream (TEMOVATE) AB-123456789 % Apply 1 application topically 2 (two) times daily.   Yes Historical Provider, MD  fluticasone (CUTIVATE) 0.05 % cream Apply 1 application topically 2 (two) times daily.    Yes Historical Provider, MD  Fluticasone-Salmeterol (ADVAIR) 250-50 MCG/DOSE AEPB Inhale 1 puff into the lungs 2 (two) times daily.   Yes Historical Provider, MD  furosemide (LASIX) 40 MG tablet Take 40 mg by mouth daily.    Yes Historical Provider, MD  insulin detemir (LEVEMIR) 100 UNIT/ML injection Inject 66 Units into the skin daily.   Yes Historical Provider, MD  latanoprost (XALATAN) 0.005 % ophthalmic solution Place 1 drop into both eyes at bedtime.   Yes Historical Provider, MD  metFORMIN (GLUCOPHAGE) 500 MG tablet Take 500  mg by mouth daily.   Yes Historical Provider, MD  metoprolol succinate (TOPROL-XL) 25 MG 24 hr tablet Take 25 mg by mouth daily.    Yes Historical Provider, MD  pantoprazole (PROTONIX) 40 MG tablet Take 40 mg by mouth daily.    Yes Historical Provider, MD  saxagliptin HCl (ONGLYZA) 5 MG TABS tablet Take 5 mg by mouth daily.   Yes Historical Provider, MD  tiotropium  (SPIRIVA) 18 MCG inhalation capsule Place 18 mcg into inhaler and inhale daily.    Yes Historical Provider, MD  traMADol (ULTRAM) 50 MG tablet Take 50 mg by mouth 2 (two) times daily.   Yes Historical Provider, MD  zolpidem (AMBIEN) 10 MG tablet Take 10 mg by mouth daily as needed.    Yes Historical Provider, MD   BP 127/63  Pulse 87  Temp(Src) 98.6 F (37 C) (Oral)  Resp 26  Ht 5' 2.5" (1.588 m)  Wt 203 lb (92.08 kg)  BMI 36.51 kg/m2  SpO2 91% Physical Exam  Constitutional: She is oriented to person, place, and time. She appears well-developed and well-nourished. No distress.  HENT:  Head: Normocephalic and atraumatic.  Eyes: EOM are normal.  Neck: Normal range of motion.  Cardiovascular: Normal rate, regular rhythm and normal heart sounds.  Exam reveals no gallop.   No murmur heard. Pulmonary/Chest: Effort normal and breath sounds normal. No respiratory distress. She has no wheezes. She has no rales. She exhibits no tenderness.  Abdominal: Soft. Bowel sounds are normal. She exhibits no distension. There is no tenderness.  Musculoskeletal: She exhibits edema.  Neurological: She is alert and oriented to person, place, and time.  Skin: She is not diaphoretic.  Psychiatric: She has a normal mood and affect. Her behavior is normal.    ED Course  Procedures (including critical care time) Labs Review Labs Reviewed  CBC - Abnormal; Notable for the following:    WBC 13.2 (*)    Hemoglobin 11.9 (*)    MCH 25.4 (*)    All other components within normal limits  BASIC METABOLIC PANEL - Abnormal; Notable for the following:    Glucose, Bld 69 (*)    Creatinine, Ser 1.63 (*)    GFR calc non Af Amer 31 (*)    GFR calc Af Amer 36 (*)    All other components within normal limits  PRO B NATRIURETIC PEPTIDE - Abnormal; Notable for the following:    Pro B Natriuretic peptide (BNP) 868.9 (*)    All other components within normal limits  URINALYSIS, ROUTINE W REFLEX MICROSCOPIC - Abnormal;  Notable for the following:    Hgb urine dipstick TRACE (*)    Protein, ur 100 (*)    Leukocytes, UA TRACE (*)    All other components within normal limits  URINE MICROSCOPIC-ADD ON - Abnormal; Notable for the following:    Squamous Epithelial / LPF FEW (*)    All other components within normal limits  CBG MONITORING, ED - Abnormal; Notable for the following:    Glucose-Capillary 47 (*)    All other components within normal limits  CBG MONITORING, ED - Abnormal; Notable for the following:    Glucose-Capillary 180 (*)    All other components within normal limits  I-STAT TROPOININ, ED    Imaging Review Dg Chest 2 View  12/05/2013   CLINICAL DATA:  Short of breath. Productive cough for 6 months. COPD.  EXAM: CHEST  2 VIEW  COMPARISON:  10/16/2013.  FINDINGS: Cardiomegaly is present.  Basilar atelectasis and airspace disease is present with small bilateral pleural effusions. Cholecystectomy clips are present in the right upper quadrant. Thickening of the fissures is present.  IMPRESSION: Mild to moderate CHF.   Electronically Signed   By: Dereck Ligas M.D.   On: 12/05/2013 16:54     EKG Interpretation   Date/Time:  Wednesday December 05 2013 15:20:02 EDT Ventricular Rate:  93 PR Interval:  170 QRS Duration: 118 QT Interval:  400 QTC Calculation: 497 R Axis:   -77 Text Interpretation:  Sinus rhythm with Premature supraventricular  complexes Left axis deviation Low voltage QRS Inferior infarct , age  undetermined Possible Anterolateral infarct , age undetermined Abnormal  ECG Confirmed by Christy Gentles  MD, DONALD ([id number]) on 12/05/2013 4:03:31 PM      MDM   Final diagnoses:  Systolic congestive heart failure, unspecified congestive heart failure chronicity     Patient is a 73 year old female with a history of  Congestive heart failure, single kidney uterine for birth, cath 1 week ago that showed no obstructive disease, chronic kidney disease that presents with with 4 weeks of  shortness of breath.  On exam patient has 3+ edema, normal lung sounds, no signs of JVD. Patient states that she was taken off of her spironolactone by her nephrologist one month ago. Patient noticed symptoms of shortness of breath shortly after that. Given that she has volume overload on exam we will check labs specifically looking for congestive heart failure.  Chest x-ray is significant for bilateral pulmonary effusion and pulmonary edema, BNP is elevated. We will treat the patient with 40 mg of IV Lasix. Patient will be a bit cardiology for further evaluation congestive heart failure exacerbation.    Renne Musca, MD 12/05/13 2000

## 2013-12-05 NOTE — ED Provider Notes (Signed)
Patient seen/examined in the Emergency Department in conjunction with Resident Physician Provider carr Patient reports shortness of breath Exam : awake/alert, no distress Plan: imaging/labs suggestive of CHF Will consult cardiology   Sharyon Cable, MD 12/05/13 1725

## 2013-12-05 NOTE — H&P (Signed)
Pt. Seen and examined. Agree with the NP/PA-C note as written. 73 yo female patient of Dr. Wynonia Lawman with a past medical history significant for diabetes, hypertension, COPD, dyslipidemia, gout and chronic kidney disease (followed by Dr. Moshe Cipro), who presents with progressive shortness of breath, abdominal swelling and orthopnea.  She underwent heart catheterization last week which included a right and left heart catheterization. This demonstrated nonobstructive coronary artery disease, however she has significantly elevated right and left heart pressures. Her RA pressure was 15, RV was 20, PA mean was 34 and wedge pressure was 31. LVEDP was 25, with a transpulmonary gradient of 3, suggesting predominantly heart failure and pulmonary venous hypertension. Prior to this catheterization she was on Lasix 40 mg daily and subsequent to these findings her dose of Lasix was not changed. She has subsequently become increasingly short of breath and is noted to have elevated BNP today with exam findings consistent with heart failure (basilar rales, abdominal fullness, 1+ LE edema, elevated JVP to jaw).  I agree with admission for IV diuresis and medication optimization. I would recommend a repeat echocardiogram. Her last echo apparently was in Dr. Thurman Coyer office in December, however it would be helpful to evaluate her right heart function as well as to confirm her depressed EF of 45% which was reported on heart catheterization. It was also reported that she had a temperature of 100 Fahrenheit at home, however there is no evidence for fever here. There is mild leukocytosis however her chest x-ray does not show evidence of pneumonia, but rather is more consistent with heart failure. We will need to monitor this closely as well.  Pixie Casino, MD, [facility] Attending Cardiologist [place]

## 2013-12-05 NOTE — ED Notes (Signed)
Pt. Given orange juice and graham crackers with peanut butter for low blood sugar.

## 2013-12-05 NOTE — ED Notes (Signed)
Nasal Cannula applied at 2lpm. Pt O2sats 94%ra and stated that she feels SOB. O2sats now 98% 2lpm.

## 2013-12-06 DIAGNOSIS — J45909 Unspecified asthma, uncomplicated: Secondary | ICD-10-CM | POA: Diagnosis present

## 2013-12-06 DIAGNOSIS — I272 Other secondary pulmonary hypertension: Secondary | ICD-10-CM | POA: Diagnosis present

## 2013-12-06 DIAGNOSIS — Z87891 Personal history of nicotine dependence: Secondary | ICD-10-CM | POA: Diagnosis not present

## 2013-12-06 DIAGNOSIS — E11649 Type 2 diabetes mellitus with hypoglycemia without coma: Secondary | ICD-10-CM | POA: Diagnosis present

## 2013-12-06 DIAGNOSIS — E785 Hyperlipidemia, unspecified: Secondary | ICD-10-CM | POA: Diagnosis present

## 2013-12-06 DIAGNOSIS — Z6837 Body mass index (BMI) 37.0-37.9, adult: Secondary | ICD-10-CM | POA: Diagnosis not present

## 2013-12-06 DIAGNOSIS — Z7951 Long term (current) use of inhaled steroids: Secondary | ICD-10-CM | POA: Diagnosis not present

## 2013-12-06 DIAGNOSIS — J984 Other disorders of lung: Secondary | ICD-10-CM | POA: Diagnosis present

## 2013-12-06 DIAGNOSIS — R635 Abnormal weight gain: Secondary | ICD-10-CM | POA: Diagnosis present

## 2013-12-06 DIAGNOSIS — R0602 Shortness of breath: Secondary | ICD-10-CM | POA: Diagnosis present

## 2013-12-06 DIAGNOSIS — I5043 Acute on chronic combined systolic (congestive) and diastolic (congestive) heart failure: Secondary | ICD-10-CM | POA: Diagnosis present

## 2013-12-06 DIAGNOSIS — N183 Chronic kidney disease, stage 3 (moderate): Secondary | ICD-10-CM | POA: Diagnosis present

## 2013-12-06 DIAGNOSIS — D72829 Elevated white blood cell count, unspecified: Secondary | ICD-10-CM | POA: Diagnosis present

## 2013-12-06 DIAGNOSIS — Z794 Long term (current) use of insulin: Secondary | ICD-10-CM | POA: Diagnosis not present

## 2013-12-06 DIAGNOSIS — G8929 Other chronic pain: Secondary | ICD-10-CM | POA: Diagnosis present

## 2013-12-06 DIAGNOSIS — M109 Gout, unspecified: Secondary | ICD-10-CM | POA: Diagnosis present

## 2013-12-06 DIAGNOSIS — J449 Chronic obstructive pulmonary disease, unspecified: Secondary | ICD-10-CM | POA: Diagnosis present

## 2013-12-06 DIAGNOSIS — I2510 Atherosclerotic heart disease of native coronary artery without angina pectoris: Secondary | ICD-10-CM | POA: Diagnosis present

## 2013-12-06 DIAGNOSIS — I129 Hypertensive chronic kidney disease with stage 1 through stage 4 chronic kidney disease, or unspecified chronic kidney disease: Secondary | ICD-10-CM | POA: Diagnosis present

## 2013-12-06 DIAGNOSIS — M1990 Unspecified osteoarthritis, unspecified site: Secondary | ICD-10-CM | POA: Diagnosis present

## 2013-12-06 DIAGNOSIS — I5033 Acute on chronic diastolic (congestive) heart failure: Secondary | ICD-10-CM | POA: Diagnosis present

## 2013-12-06 DIAGNOSIS — E1122 Type 2 diabetes mellitus with diabetic chronic kidney disease: Secondary | ICD-10-CM | POA: Diagnosis present

## 2013-12-06 LAB — BASIC METABOLIC PANEL
Anion gap: 12 (ref 5–15)
BUN: 23 mg/dL (ref 6–23)
CO2: 30 mEq/L (ref 19–32)
Calcium: 9.2 mg/dL (ref 8.4–10.5)
Chloride: 104 mEq/L (ref 96–112)
Creatinine, Ser: 1.64 mg/dL — ABNORMAL HIGH (ref 0.50–1.10)
GFR calc Af Amer: 36 mL/min — ABNORMAL LOW (ref >90)
GFR calc non Af Amer: 31 mL/min — ABNORMAL LOW (ref >90)
Glucose, Bld: 59 mg/dL — ABNORMAL LOW (ref 70–99)
Potassium: 3.8 mEq/L (ref 3.7–5.3)
Sodium: 146 mEq/L (ref 137–147)

## 2013-12-06 LAB — GLUCOSE, CAPILLARY
Glucose-Capillary: 95 mg/dL (ref 70–99)
Glucose-Capillary: 213 mg/dL — ABNORMAL HIGH (ref 70–99)
Glucose-Capillary: 162 mg/dL — ABNORMAL HIGH (ref 70–99)
Glucose-Capillary: 184 mg/dL — ABNORMAL HIGH (ref 70–99)

## 2013-12-06 LAB — TROPONIN I
Troponin I: <0.3 ng/mL (ref <0.30)
Troponin I: <0.3 ng/mL (ref <0.30)

## 2013-12-06 MED ORDER — INSULIN ASPART 100 UNIT/ML ~~LOC~~ SOLN
0.0000 [IU] | Freq: Three times a day (TID) | SUBCUTANEOUS | Status: DC
  Administered 2013-12-07: 8 [IU] via SUBCUTANEOUS
  Administered 2013-12-07: 2 [IU] via SUBCUTANEOUS

## 2013-12-06 NOTE — Progress Notes (Signed)
Inpatient Diabetes Program Recommendations  AACE/ADA: New Consensus Statement on Inpatient Glycemic Control (2013)  Target Ranges:  Prepandial:   less than 140 mg/dL      Peak postprandial:   less than 180 mg/dL (1-2 hours)      Critically ill patients:  140 - 180 mg/dL  Results for SERIYAH, TONGA (MRN [id number]) as of 12/06/2013 11:56  Ref. Range 12/05/2013 17:00 12/05/2013 19:38 12/05/2013 22:35 12/06/2013 06:13 12/06/2013 10:56  Glucose-Capillary Latest Range: 70-99 mg/dL 47 (L) 180 (H) 237 (H) 95 213 (H)    Inpatient Diabetes Program Recommendations Correction (SSI): add Novolog moderate scale TID per Glycemic Control Order-set Diet: Add Carb modified to current diet Thank you  Raoul Pitch BSN, RN,CDE Inpatient Diabetes Coordinator [phone] (team pager)

## 2013-12-06 NOTE — Progress Notes (Signed)
  Echocardiogram 2D Echocardiogram has been performed.  Diamond Nickel 12/06/2013, 9:37 AM

## 2013-12-06 NOTE — Progress Notes (Signed)
Subjective:  Chart reviewed and events of past few days reviewed. Had pul BP at cath but not much CAD.  Wedge was up but BNP mot that high.    Objective:  Vital Signs in the last 24 hours: BP 116/56  Pulse 71  Temp(Src) 97.8 F (36.6 C) (Oral)  Resp 18  Ht 5\' 2"  (1.575 m)  Wt 91 kg (200 lb 9.9 oz)  BMI 36.68 kg/m2  SpO2 97%  Physical Exam: Pleasant obese BF in NAD Lungs:  Clear  Cardiac:  Regular rhythm, normal S1 and S2, no S3 Abdomen:  Soft, nontender, no masses Extremities:  No edema present  Intake/Output from previous day: 10/14 0701 - 10/15 0700 In: 360 [P.O.:360] Out: 1050 [Urine:1050] Weight Filed Weights   12/05/13 1529 12/05/13 2031 12/06/13 0606  Weight: 92.08 kg (203 lb) 91.5 kg (201 lb 11.5 oz) 91 kg (200 lb 9.9 oz)    Lab Results: Basic Metabolic Panel:  Recent Labs  12/05/13 1527 12/05/13 2306 12/06/13 0244  NA 146  --  146  K 4.4  --  3.8  CL 105  --  104  CO2 29  --  30  GLUCOSE 69*  --  59*  BUN 23  --  23  CREATININE 1.63* 1.70* 1.64*    CBC:  Recent Labs  12/05/13 1527 12/05/13 2306  WBC 13.2* 13.1*  HGB 11.9* 11.6*  HCT 38.2 36.3  MCV 81.6 81.2  PLT 394 372    BNP    Component Value Date/Time   PROBNP 868.9* 12/05/2013 1527   Telemetry: sinus  Assessment/Plan: 1. Acute on chronic diastolic CHF 2. Pulmonary hypertension 3. Obesity with weight gain 4. Stage 3-4 CKD stable  Plan:  Will ask pulmonary to also see.  Her LV function is around 45% EF.  Her Wieght may be contributing.  COntinue diuresis tonight.       Kerry Hough  MD [facility]  12/06/2013, 5:33 PM

## 2013-12-06 NOTE — Progress Notes (Signed)
PHARMACIST - PHYSICIAN COMMUNICATION  CONCERNING:  METFORMIN SAFE ADMINISTRATION POLICY  RECOMMENDATION: Metformin has been placed on DISCONTINUE (rejected order) STATUS and should be reordered only after any of the conditions below are ruled out.  Current safety recommendations include avoiding metformin for a minimum of 48 hours after the patient's exposure to intravenous contrast media.  DESCRIPTION:  The Pharmacy Committee has adopted a policy that restricts the use of metformin in hospitalized patients until all the contraindications to administration have been ruled out. Specific contraindications are: []  Serum creatinine ? 1.5 for males [x]  Serum creatinine ? 1.4 for females []  Shock, acute MI, sepsis, hypoxemia, dehydration []  Planned administration of intravenous iodinated contrast media []  Heart Failure patients with low EF []  Acute or chronic metabolic acidosis (including DKA)     Thank you, Hildred Laser, Pharm D 12/06/2013 9:03 AM

## 2013-12-06 NOTE — Progress Notes (Signed)
Admitted pt to rm 3E08 from ED, oriented to room, call bell placed within reach, denied pain at this time, pt SOB with exertion, cardiac monitor placed. Admission assessment done, orders carried out. Daughter at bedside. Will continue to monitor.  12/05/13 2031  Vitals  Temp 97.6 F (36.4 C)  Temp Source Oral  BP ! 157/61 mmHg  BP Location Right arm  BP Method Automatic  Patient Position (if appropriate) Sitting  Pulse Rate 86  Pulse Rate Source Dinamap  Oxygen Therapy  SpO2 92 %  O2 Device None (Room air)  Height and Weight  Height 5\' 2"  (1.575 m)  Weight 91.5 kg (201 lb 11.5 oz)  Type of Scale Used Standing (Scale A)  Type of Weight Actual  BSA (Calculated - sq m) 2 sq meters  BMI (Calculated) 37  Weight in (lb) to have BMI = 25 136.4    Clinton Wahlberg, Blanch Media, RN

## 2013-12-07 ENCOUNTER — Encounter (HOSPITAL_COMMUNITY): Payer: Self-pay | Admitting: Pulmonary Disease

## 2013-12-07 DIAGNOSIS — I272 Pulmonary hypertension, unspecified: Secondary | ICD-10-CM | POA: Diagnosis present

## 2013-12-07 DIAGNOSIS — R0902 Hypoxemia: Secondary | ICD-10-CM

## 2013-12-07 DIAGNOSIS — E1129 Type 2 diabetes mellitus with other diabetic kidney complication: Secondary | ICD-10-CM | POA: Diagnosis present

## 2013-12-07 DIAGNOSIS — N183 Chronic kidney disease, stage 3 unspecified: Secondary | ICD-10-CM | POA: Insufficient documentation

## 2013-12-07 DIAGNOSIS — I5042 Chronic combined systolic (congestive) and diastolic (congestive) heart failure: Secondary | ICD-10-CM | POA: Diagnosis present

## 2013-12-07 DIAGNOSIS — I5020 Unspecified systolic (congestive) heart failure: Secondary | ICD-10-CM

## 2013-12-07 DIAGNOSIS — R0602 Shortness of breath: Secondary | ICD-10-CM

## 2013-12-07 DIAGNOSIS — I5022 Chronic systolic (congestive) heart failure: Secondary | ICD-10-CM | POA: Diagnosis present

## 2013-12-07 LAB — BASIC METABOLIC PANEL
Anion gap: 10 (ref 5–15)
BUN: 26 mg/dL — ABNORMAL HIGH (ref 6–23)
CO2: 31 mEq/L (ref 19–32)
Calcium: 8.8 mg/dL (ref 8.4–10.5)
Chloride: 104 mEq/L (ref 96–112)
Creatinine, Ser: 1.75 mg/dL — ABNORMAL HIGH (ref 0.50–1.10)
GFR calc Af Amer: 33 mL/min — ABNORMAL LOW (ref >90)
GFR calc non Af Amer: 28 mL/min — ABNORMAL LOW (ref >90)
Glucose, Bld: 66 mg/dL — ABNORMAL LOW (ref 70–99)
Potassium: 4.2 mEq/L (ref 3.7–5.3)
Sodium: 145 mEq/L (ref 137–147)

## 2013-12-07 LAB — GLUCOSE, CAPILLARY
Glucose-Capillary: 53 mg/dL — ABNORMAL LOW (ref 70–99)
Glucose-Capillary: 142 mg/dL — ABNORMAL HIGH (ref 70–99)
Glucose-Capillary: 257 mg/dL — ABNORMAL HIGH (ref 70–99)
Glucose-Capillary: 220 mg/dL — ABNORMAL HIGH (ref 70–99)
Glucose-Capillary: 123 mg/dL — ABNORMAL HIGH (ref 70–99)

## 2013-12-07 MED ORDER — FUROSEMIDE 40 MG PO TABS: 40.0000 mg | ORAL_TABLET | Freq: Two times a day (BID) | ORAL | Status: DC

## 2013-12-07 MED ORDER — INSULIN DETEMIR 100 UNIT/ML ~~LOC~~ SOLN: 50.0000 [IU] | Freq: Every day | SUBCUTANEOUS | Status: DC

## 2013-12-07 NOTE — Discharge Summary (Signed)
Physician Discharge Summary  Patient ID: Ralph Briskey MRN: [id number] DOB/AGE: 1940-11-12 73 y.o.  Admit date: 12/05/2013 Discharge date: 12/07/2013  Primary Physician:  Dr. Donnie Coffin  Primary Discharge Diagnosis:  1. Acute combine systolic and diastolic congestive heart failure  Secondary Discharge Diagnosis: 2. Hypoxemia 3. Pulmonary hypertension 4. COPD and restrictive lung disease 5. Diabetes mellitus with renal complications 6. Hypoglycemia 7. Chronic combined systolic and diastolic congestive heart failure 8. Obesity with need to lose weight 9. Chronic kidney disease stage III  Procedures:  Echocardiogram  Consults:  Pulmonary medicine  Hospital Course: This 73 year old female has a history of chronic dyspnea and has known diabetes hypertension and chronic kidney disease she has been thought to have an element of diastolic dysfunction. Last week she had a cardiac catheterization showing moderate pulmonary hypertension and mildly reduced LV function. She was to be seen this  week but developed increasing shortness of breath and presented to the emergency room with some hypoxemia and increasingly short of breath with a mildly elevated BNP of 890. She was admitted for intravenous diuresis.  She received intravenous furosemide 40 mg twice daily with a slight rise in her creatinine. Her creatinine had remained stable following the catheterization and was 1.75 on discharge. Because her BNP was not that elevated I asked pulmonary medicine to see her. It was felt that she had restrictive lung disease as well as COPD and moderate pulmonary hypertension and obesity. He was felt to be volume status is difficult to manage because of chronic kidney disease and deconditioning. Pulmonary medicine recommended home Symbicort Spiriva and when necessary albuterol and an outpatient sleep study. Cardiac and pulmonary rehabilitation was recommended. Nutrition was asked reviewing dietary tips  for weight loss for a diabetic diet. An ambulatory desaturation study was also requested. Diuresis his renal function and blood pressure permitted.  She was significantly improved after receiving intravenous furosemide. Physical therapy ambulated her but she did not qualify for home oxygen with ambulation. I made arrangements for her to restrict her fluids to 2 quarts per day and she'll be discharged on Lasix 40 mg twice daily. She will followup closely in one week. I also asked for a home health nurse to go out and see her. Because of the hypoglycemia demonstrated while she was in the hospital Levemir was reduced to 50 units daily. She is discharged at this time in improved condition. Etiology to her dyspnea is thought to be multifactorial. The importance of weight loss was discussed with the patient. Echocardiogram showed LVH with a systolic ejection fraction of around 45-50%.  Discharge Exam: Blood pressure 121/54, pulse 75, temperature 98 F (36.7 C), temperature source Oral, resp. rate 20, height 5\' 2"  (1.575 m), weight 91.8 kg (202 lb 6.1 oz), SpO2 94.00%. Weight: 91.8 kg (202 lb 6.1 oz) Lungs clear, no peripheral edema. Discharge weight was 202 pounds  Labs: CBC:   Lab Results  Component Value Date   WBC 13.1* 12/05/2013   HGB 11.6* 12/05/2013   HCT 36.3 12/05/2013   MCV 81.2 12/05/2013   PLT 372 12/05/2013    CMP:  Recent Labs Lab 12/07/13 0330  NA 145  K 4.2  CL 104  CO2 31  BUN 26*  CREATININE 1.75*  CALCIUM 8.8  GLUCOSE 66*    Lipid Panel     Component Value Date/Time   CHOL 205* 03/27/2007 2136   TRIG 147 03/27/2007 2136   HDL 79 03/27/2007 2136   CHOLHDL 2.6 Ratio 03/27/2007 2136   VLDL  29 03/27/2007 2136   [place] 97 03/27/2007 2136    Cardiac Enzymes:  Recent Labs  12/05/13 2306 12/06/13 0244 12/06/13 0815  TROPONINI <0.30 <0.30 <0.30    BNP (last 3 results)  Recent Labs  12/05/13 1527  PROBNP 868.9*   Thyroid: Lab Results  Component Value Date    TSH 2.370 12/05/2013    Radiology: Cardiomegaly, small pleural effusion, mild congestive heart failure  EKG: Sinus with left bundle branch block  Discharge Medications:   Medication List    STOP taking these medications       metFORMIN 500 MG tablet  Commonly known as:  GLUCOPHAGE     zolpidem 10 MG tablet  Commonly known as:  AMBIEN      TAKE these medications       albuterol 108 (90 BASE) MCG/ACT inhaler  Commonly known as:  PROVENTIL HFA;VENTOLIN HFA  Inhale 2 puffs into the lungs every 4 (four) hours as needed for shortness of breath.     amLODipine 10 MG tablet  Commonly known as:  NORVASC  Take 5 mg by mouth daily.     atorvastatin 80 MG tablet  Commonly known as:  LIPITOR  Take 80 mg by mouth daily.     clobetasol cream 0.05 %  Commonly known as:  TEMOVATE  Apply 1 application topically 2 (two) times daily.     fluticasone 0.05 % cream  Commonly known as:  CUTIVATE  Apply 1 application topically 2 (two) times daily.     Fluticasone-Salmeterol 250-50 MCG/DOSE Aepb  Commonly known as:  ADVAIR  Inhale 1 puff into the lungs 2 (two) times daily.     furosemide 40 MG tablet  Commonly known as:  LASIX  Take 1 tablet (40 mg total) by mouth 2 (two) times daily.     insulin detemir 100 UNIT/ML injection  Commonly known as:  LEVEMIR  Inject 0.5 mLs (50 Units total) into the skin daily.     latanoprost 0.005 % ophthalmic solution  Commonly known as:  XALATAN  Place 1 drop into both eyes at bedtime.     metoprolol succinate 25 MG 24 hr tablet  Commonly known as:  TOPROL-XL  Take 25 mg by mouth daily.     pantoprazole 40 MG tablet  Commonly known as:  PROTONIX  Take 40 mg by mouth daily.     saxagliptin HCl 5 MG Tabs tablet  Commonly known as:  ONGLYZA  Take 5 mg by mouth daily.     tiotropium 18 MCG inhalation capsule  Commonly known as:  SPIRIVA  Place 18 mcg into inhaler and inhale daily.     traMADol 50 MG tablet  Commonly known as:  ULTRAM   Take 50 mg by mouth 2 (two) times daily.       Followup plans and appointments:  Followup with Dr. Wynonia Lawman in one week. There will be an outpatient sleep evaluation noted. Cardiac and pulmonary rehabilitation also will be encouraged. She will need an outpatient dietary consultation.  Time spent with patient to include physician time:  45 minutes   Signed: W. Doristine Church. MD [facility] 12/07/2013, 4:17 PM

## 2013-12-07 NOTE — Evaluation (Signed)
Physical Therapy Evaluation Patient Details Name: Stephanie Woodward MRN: [id number] DOB: Aug 07, 1940 Today's Date: 12/07/2013   History of Present Illness  Pt is a 73 y/o female admitted with acute combine systolic and diastolic congestive heart failure.  Clinical Impression  Pt admitted with the above. Pt currently with functional limitations due to the deficits listed below (see PT Problem List). At the time of PT eval pt was able to perform transfers and ambulation with no physical assist. Tolerance for functional activity is low, and pt was able to ambulate a very short distance before becoming SOB. During this time, O2 sats at 89% on RA. At rest sats at 91% on RA.  Pt will benefit from skilled PT to increase their independence and safety with mobility to allow discharge to the venue listed below. Recommending HHPT to improve independence and safety at home while improving tolerance for functional activity.       Follow Up Recommendations Home health PT;Supervision - Intermittent    Equipment Recommendations  Other (comment) (Rollator)    Recommendations for Other Services       Precautions / Restrictions Precautions Precautions: Fall Restrictions Weight Bearing Restrictions: No      Mobility  Bed Mobility               General bed mobility comments: Pt sitting EOB when PT arrived.   Transfers Overall transfer level: Needs assistance Equipment used: Rolling walker (2 wheeled);Quad cane Transfers: Sit to/from Stand Sit to Stand: Supervision         General transfer comment: No physical assist  Ambulation/Gait Ambulation/Gait assistance: Supervision Ambulation Distance (Feet): 100 Feet Assistive device: Rolling walker (2 wheeled);Quad cane Gait Pattern/deviations: Step-through pattern;Decreased stride length;Trunk flexed;Trendelenburg Gait velocity: Decreased Gait velocity interpretation: Below normal speed for age/gender General Gait Details: Pt able to  ambulate with the quad cane, with cues for technique and putting all 4 legs of the cane on the ground at the same time. Pt did exceptionally better with the RW for energy conservation.   Stairs            Wheelchair Mobility    Modified Rankin (Stroke Patients Only)       Balance Overall balance assessment: No apparent balance deficits (not formally assessed)                                           Pertinent Vitals/Pain Pain Assessment: No/denies pain    Home Living Family/patient expects to be discharged to:: Private residence Living Arrangements: Children Available Help at Discharge: Family;Available 24 hours/day (For first couple days ) Type of Home: House Home Access: Level entry     Home Layout: One level Home Equipment: Ewa Gentry - 2 wheels;Shower seat      Prior Function Level of Independence: Independent with assistive device(s)         Comments: Still driving, doing own grocery shopping (up until about a couple weeks ago) independently.      Hand Dominance   Dominant Hand: Right    Extremity/Trunk Assessment   Upper Extremity Assessment: Defer to OT evaluation           Lower Extremity Assessment: Generalized weakness      Cervical / Trunk Assessment: Normal  Communication   Communication: No difficulties  Cognition Arousal/Alertness: Awake/alert Behavior During Therapy: WFL for tasks assessed/performed Overall Cognitive Status: Within Functional Limits  for tasks assessed                      General Comments      Exercises        Assessment/Plan    PT Assessment Patient needs continued PT services  PT Diagnosis Difficulty walking;Generalized weakness   PT Problem List Decreased strength;Decreased range of motion;Decreased activity tolerance;Decreased balance;Decreased mobility;Decreased knowledge of use of DME;Decreased safety awareness;Decreased knowledge of precautions;Cardiopulmonary  status limiting activity  PT Treatment Interventions DME instruction;Gait training;Stair training;Functional mobility training;Therapeutic activities;Therapeutic exercise;Neuromuscular re-education;Patient/family education   PT Goals (Current goals can be found in the Care Plan section) Acute Rehab PT Goals Patient Stated Goal: To return to PLOF - independent with grocery shopping, etc PT Goal Formulation: With patient/family Time For Goal Achievement: 12/14/13 Potential to Achieve Goals: Good    Frequency Min 3X/week   Barriers to discharge        Co-evaluation               End of Session Equipment Utilized During Treatment: Gait belt Activity Tolerance: Patient limited by fatigue Patient left: in chair;with call bell/phone within reach;with family/visitor present;Other (comment) (MD present in room) Nurse Communication: Mobility status         Time: 1545-1630 PT Time Calculation (min): 45 min   Charges:   PT Evaluation $Initial PT Evaluation Tier I: 1 Procedure PT Treatments $Gait Training: 23-37 mins $Therapeutic Activity: 8-22 mins   PT G Codes:          Rolinda Roan 12/07/2013, 4:41 PM  Rolinda Roan, PT, DPT Acute Rehabilitation Services Pager: [phone]

## 2013-12-07 NOTE — Progress Notes (Signed)
Nutrition Brief Note   RD consulted for nutrition education regarding diabetes & renal disease needs for weight loss.   Body mass index is 37.01 kg/(m^2). Pt meets criteria for Obesity based on current BMI. Pt reports following a low fat and carb balanced diet PTA. She also reports recently trying to follow a lower sodium/heart healthy diet.   RD reviewed patient's dietary recall; pt eats a fairly healthy and balanced diet. Provided tips for reducing sodium and calories. Provided ideas for healthier snack options.  Encouraged pt to eat a general healthful diet; encouraged My Plate method. RD provided "Weight Loss Tips" handout from the Academy [employer] (AND). Emphasized the importance of serving sizes and provided examples of correct portions of common foods. Provided examples of ways to balance meals/snacks. Pt reports only drinking calorie-free and sugar-free beverages.  Pt had several questions regarding sodium. Reviewed high sodium foods for patient to avoid- provided "Low Sodium Nutrition Therapy" handout from the AND as well as "5-Day 1500-Calorie Sample Menus". Encouraged pt to discuss physical activity options with physician. Teach back method used.  Expect good compliance.  Current diet order is 2 Gram Sodium, patient is consuming approximately 100% of meals at this time. Labs and medications reviewed. No further nutrition interventions warranted at this time. RD contact information provided. If additional nutrition issues arise, please re-consult RD.  Pryor Ochoa RD, LDN Inpatient Clinical Dietitian Pager: [phone] After Hours Pager: [phone]

## 2013-12-07 NOTE — Progress Notes (Signed)
Patient is discharged to home with daughter. DC IV, DC tele. DC instructions explained, patient verbalizes understanding. Rollator delivered by [facility]. Prescriptions explained.

## 2013-12-07 NOTE — Consult Note (Addendum)
Name: Stephanie Woodward MRN: [id number] DOB: November 16, 1940    ADMISSION DATE:  12/05/2013 CONSULTATION DATE:  12/07/13  REFERRING MD :  Dr. Wynonia Lawman   CHIEF COMPLAINT:  Shortness of breath  BRIEF PATIENT DESCRIPTION: 73 y/o morbidly obese female with PMH of HTN, CAD, DM, COPD who recently underwent L/RHC for dyspnea evaluation on 10/8 readmitted on 10/13 for worsening shortness of breath & cough.  PCCM consulted 10/16 for further pulmonary evaluation.    SIGNIFICANT EVENTS  06/2013 PFT's >> restrictive lung disease with reduction in DLCO indicating parenchymal disease or pulm edema, similar picture can be seen with [facility] 11/29/2013 R/L HC >> no significant obstructive stenosis, decreased LV fxn, moderate PAH (50-60/20) 10/13  Admit with worsening SOB, cough.  Stable wt at 203 since last admit.   STUDIES:  10/15 ECHO >> nml LV, mod LVH, EF 45-50%, mild MR, LA moderately dilated   HISTORY OF PRESENT ILLNESS:  73 y/o morbidly obese female, former smoker (1ppd x 42 years), with PMH of HTN, CAD, DM, ? COPD, Arthritis with chronic back pain, solitary kidney & psoriasis admitted 10/13 for worsening shortness of breath and predominantly non-productive cough (occasional white sputum).  She recently underwent L/RHC by Dr. Wynonia Lawman for dyspnea evaluation on 10/8 which was notable for no significant coronary disease but positive for moderate PAH (pressures 50-60/20).  Patient endorses 3-4 month history of worsening shortness of breath.  Prior to that, she was participating in water aerobics and walking daily.  She is currently still able to perform ADL's but has to take breaks with limited activity.  She also has noted a need for an increase in number of pillows as she can not lie flat at night (is up to 4+ pillows).  The patient endorses lower extremity swelling that is now improved since admission.  Denies post nasal drip, sore throat, fevers, chills, n/v/d.  She reports she does not sleep well at night - frequent  waking with long periods of insomnia, not sure if she snores.  She has never had a sleep study.  Of note, her daughter carries a hx of lupus.    PCCM consulted 10/16 for further pulmonary evaluation.    PAST MEDICAL HISTORY :   has a past medical history of Diabetes mellitus; Hypertension; Asthma; COPD (chronic obstructive pulmonary disease); Renal disorder; Gout; and Hyperlipemia.  has no past surgical history on file. Prior to Admission medications   Medication Sig Start Date End Date Taking? Authorizing Provider  albuterol (PROVENTIL HFA;VENTOLIN HFA) 108 (90 BASE) MCG/ACT inhaler Inhale 2 puffs into the lungs every 4 (four) hours as needed for shortness of breath.    Yes Historical Provider, MD  amLODipine (NORVASC) 10 MG tablet Take 5 mg by mouth daily.   Yes Historical Provider, MD  atorvastatin (LIPITOR) 80 MG tablet Take 80 mg by mouth daily.   Yes Historical Provider, MD  clobetasol cream (TEMOVATE) [id number] % Apply 1 application topically 2 (two) times daily.   Yes Historical Provider, MD  fluticasone (CUTIVATE) 0.05 % cream Apply 1 application topically 2 (two) times daily.    Yes Historical Provider, MD  Fluticasone-Salmeterol (ADVAIR) 250-50 MCG/DOSE AEPB Inhale 1 puff into the lungs 2 (two) times daily.   Yes Historical Provider, MD  furosemide (LASIX) 40 MG tablet Take 40 mg by mouth daily.    Yes Historical Provider, MD  insulin detemir (LEVEMIR) 100 UNIT/ML injection Inject 66 Units into the skin daily.   Yes Historical Provider, MD  latanoprost (XALATAN) 0.005 %  ophthalmic solution Place 1 drop into both eyes at bedtime.   Yes Historical Provider, MD  metFORMIN (GLUCOPHAGE) 500 MG tablet Take 500 mg by mouth daily.   Yes Historical Provider, MD  metoprolol succinate (TOPROL-XL) 25 MG 24 hr tablet Take 25 mg by mouth daily.    Yes Historical Provider, MD  pantoprazole (PROTONIX) 40 MG tablet Take 40 mg by mouth daily.    Yes Historical Provider, MD  saxagliptin HCl (ONGLYZA) 5 MG  TABS tablet Take 5 mg by mouth daily.   Yes Historical Provider, MD  tiotropium (SPIRIVA) 18 MCG inhalation capsule Place 18 mcg into inhaler and inhale daily.    Yes Historical Provider, MD  traMADol (ULTRAM) 50 MG tablet Take 50 mg by mouth 2 (two) times daily.   Yes Historical Provider, MD  zolpidem (AMBIEN) 10 MG tablet Take 10 mg by mouth daily as needed.    Yes Historical Provider, MD   Allergies  Allergen Reactions  . Eggs Or Egg-Derived Products Nausea And Vomiting  . Lisinopril   . Penicillins Nausea And Vomiting    FAMILY HISTORY:  family history is not on file. SOCIAL HISTORY:  reports that she has never smoked. She does not have any smokeless tobacco history on file. She reports that she does not drink alcohol or use illicit drugs.  REVIEW OF SYSTEMS:   Constitutional: Negative for fever, chills, weight loss, malaise/fatigue and diaphoresis.  HENT: Negative for hearing loss, ear pain, nosebleeds, congestion, sore throat, neck pain, tinnitus and ear discharge.   Eyes: Negative for blurred vision, double vision, photophobia, pain, discharge and redness.  Respiratory: Negative for hemoptysis, wheezing and stridor.  SEE HPI.  Cardiovascular: Negative for chest pain, palpitations, orthopnea, claudication, leg swelling and PND.  Gastrointestinal: Negative for nausea, vomiting, abdominal pain, diarrhea, constipation, blood in stool and melena. Occasional heartburn Genitourinary: Negative for dysuria, urgency, frequency, hematuria and flank pain.  Musculoskeletal: Negative for myalgias, back pain, joint pain and falls.  Skin: Negative for itching and rash.  Neurological: Negative for dizziness, tingling, tremors, sensory change, speech change, focal weakness, seizures, loss of consciousness, weakness and headaches.  Endo/Heme/Allergies: Negative for environmental allergies and polydipsia. Does not bruise/bleed easily.  SUBJECTIVE: Pt reports improvement in symptoms since admission.  Negative ~ 1L since admit.    VITAL SIGNS: Temp:  [97.8 F (36.6 C)-98.6 F (37 C)] 98 F (36.7 C) (10/16 0700) Pulse Rate:  [71-99] 81 (10/16 1014) Resp:  [18] 18 (10/16 0700) BP: (116-144)/(56-77) 141/58 mmHg (10/16 1014) SpO2:  [95 %-100 %] 98 % (10/16 1155) Weight:  [202 lb 6.1 oz (91.8 kg)] 202 lb 6.1 oz (91.8 kg) (10/16 0606)  PHYSICAL EXAMINATION: General:  Morbidly obese adult female in NAD Neuro:  AAOx4, speech clear, MAE HEENT:  Mm pink/moist, short neck Cardiovascular:  s1s2 rrr, no m/r/g Lungs:  resp's even/non-labored, lungs bilaterally distant but clear  Abdomen:  Round/soft, bsx4 active, central obesity Musculoskeletal:  No acute deformities  Skin:  Warm/dry, trace LE edema   Recent Labs Lab 12/05/13 1527 12/05/13 2306 12/06/13 0244 12/07/13 0330  NA 146  --  146 145  K 4.4  --  3.8 4.2  CL 105  --  104 104  CO2 29  --  30 31  BUN 23  --  23 26*  CREATININE 1.63* 1.70* 1.64* 1.75*  GLUCOSE 69*  --  59* 66*    Recent Labs Lab 12/05/13 1527 12/05/13 2306  HGB 11.9* 11.6*  HCT 38.2 36.3  WBC 13.2* 13.1*  PLT 394 372   Dg Chest 2 View  12/05/2013   CLINICAL DATA:  Short of breath. Productive cough for 6 months. COPD.  EXAM: CHEST  2 VIEW  COMPARISON:  10/16/2013.  FINDINGS: Cardiomegaly is present. Basilar atelectasis and airspace disease is present with small bilateral pleural effusions. Cholecystectomy clips are present in the right upper quadrant. Thickening of the fissures is present.  IMPRESSION: Mild to moderate CHF.   Electronically Signed   By: Dereck Ligas M.D.   On: 12/05/2013 16:54    ASSESSMENT / PLAN:  Dyspnea - likely multifactorial in the setting of former tobacco abuse, restrictive disease (on PFT's), Moderate PAH (as seen on RHC), Morbid Obesity, Component of CHF, difficult to manage volume status with CKD and deconditioning.    Plan: -Continue home symbicort, spiriva & PRN albuterol -Arranged for outpatient sleep evaluation  with Dr. Gwenette Greet -Recommend referral to cardiac / pulmonary rehab -Have asked nutrition to review diabetic diet with patient & tips for weight loss -Diuresis as renal function / BP permit  -Ambulatory desaturation study, if <=90% (given pulmonary HTN) then will need home O2.  Noe Gens, NP-C [facility] Pgr: [phone] or [phone]  Patient seen and examined, agree with above note.  I dictated the care and orders written for this patient under my direction.  Rush Farmer, MD [phone]  12/07/2013, 1:35 PM

## 2013-12-07 NOTE — Progress Notes (Signed)
Pt. Informed RN that she felt dizzy and that her blood glucose level may be low. Pts. Blood glucose level 53 upon assessment.  Orange juice given to pt. Pt. Also requesting a small snack. Blood glucose level 142 upon reassessment. Pt. Alert and stable. RN will continue to monitor pt. Alessia Gonsalez, Katherine Roan

## 2013-12-07 NOTE — Progress Notes (Signed)
Subjective:  She says that she feels considerably better with less dyspnea although objectively weight has not changed and not much output really. No chest pain. One episode of hypoglycemia earlier relieved with orange juice.  Objective:  Vital Signs in the last 24 hours: BP 134/56  Pulse 82  Temp(Src) 98 F (36.7 C) (Oral)  Resp 18  Ht 5\' 2"  (1.575 m)  Wt 91.8 kg (202 lb 6.1 oz)  BMI 37.01 kg/m2  SpO2 100%  Physical Exam: Pleasant obese BF in NAD Lungs: Scattered wheezes appreciated today  Cardiac:  Regular rhythm, normal S1 and S2, no S3 Abdomen:  Soft, nontender, no masses Extremities:  Trace edema present  Intake/Output from previous day: 10/15 0701 - 10/16 0700 In: 960 [P.O.:960] Out: 1500 [Urine:1500] Weight Filed Weights   12/05/13 2031 12/06/13 0606 12/07/13 0606  Weight: 91.5 kg (201 lb 11.5 oz) 91 kg (200 lb 9.9 oz) 91.8 kg (202 lb 6.1 oz)    Lab Results: Basic Metabolic Panel:  Recent Labs  12/06/13 0244 12/07/13 0330  NA 146 145  K 3.8 4.2  CL 104 104  CO2 30 31  GLUCOSE 59* 66*  BUN 23 26*  CREATININE 1.64* 1.75*    CBC:  Recent Labs  12/05/13 1527 12/05/13 2306  WBC 13.2* 13.1*  HGB 11.9* 11.6*  HCT 38.2 36.3  MCV 81.6 81.2  PLT 394 372    BNP    Component Value Date/Time   PROBNP 868.9* 12/05/2013 1527   Telemetry: sinus  Assessment/Plan: 1. Acute on chronic diastolic CHF 2. Pulmonary hypertension 3. Obesity with weight gain 4. Stage 3-4 CKD stable 5 for chronic dyspnea likely multifactorial.  Plan:  Have asked for a pulmonary consultation he will see today. May be able to discharge home later after they have seen her. Her BNP level although elevated is not spectacularly high. She did have pulmonary function tests previously and will await pulmonary recommendations.      Kerry Hough  MD [facility]  12/07/2013, 8:46 AM

## 2013-12-07 NOTE — Progress Notes (Signed)
Inpatient Diabetes Program Recommendations  AACE/ADA: New Consensus Statement on Inpatient Glycemic Control (2013)  Target Ranges:  Prepandial:   less than 140 mg/dL      Peak postprandial:   less than 180 mg/dL (1-2 hours)      Critically ill patients:  140 - 180 mg/dL  Results for CALESHA, CIRAOLO (MRN [id number]) as of 12/07/2013 14:33  Ref. Range 12/07/2013 04:54  Glucose-Capillary Latest Range: 70-99 mg/dL 53 (L)   Recommend decreasing Levemir to 50 units.   Thank you  Raoul Pitch BSN, RN,CDE Inpatient Diabetes Coordinator [phone] (team pager)

## 2014-01-11 ENCOUNTER — Other Ambulatory Visit: Payer: Self-pay | Admitting: Physician Assistant

## 2014-01-11 ENCOUNTER — Ambulatory Visit
Admission: RE | Admit: 2014-01-11 | Discharge: 2014-01-11 | Disposition: A | Discharge status: Home / Self Care | Payer: Medicare Other | Source: Ambulatory Visit | Attending: Physician Assistant | Admitting: Physician Assistant

## 2014-01-11 DIAGNOSIS — R0602 Shortness of breath: Secondary | ICD-10-CM

## 2014-01-16 ENCOUNTER — Institutional Professional Consult (permissible substitution): Payer: Medicare Other | Admitting: Pulmonary Disease

## 2014-01-22 ENCOUNTER — Ambulatory Visit (INDEPENDENT_AMBULATORY_CARE_PROVIDER_SITE_OTHER): Payer: Medicare Other | Admitting: Pulmonary Disease

## 2014-01-22 ENCOUNTER — Encounter: Payer: Self-pay | Admitting: Pulmonary Disease

## 2014-01-22 VITALS — BP 132/78 | HR 80 | Temp 98.0°F | Ht 62.0 in | Wt 203.0 lb

## 2014-01-22 DIAGNOSIS — G4733 Obstructive sleep apnea (adult) (pediatric): Secondary | ICD-10-CM

## 2014-01-22 DIAGNOSIS — R0602 Shortness of breath: Secondary | ICD-10-CM

## 2014-01-22 NOTE — Assessment & Plan Note (Signed)
The patient has significant dyspnea on exertion that is certainly secondary to her underlying chronic congestive heart failure, morbid obesity, and deconditioning. She had pulmonary function studies in 2011 that showed only minimal airflow limitation, and therefore it is very unlikely this is a significant contributor to her breathing issue. She is on basically maximal bronchodilators, and we'll therefore check pulmonary function studies for reevaluation. I will call her when these studies are available.

## 2014-01-22 NOTE — Patient Instructions (Signed)
Will schedule for sleep study as soon as possible.  Will call you with results. Will schedule for breathing studies

## 2014-01-22 NOTE — Progress Notes (Signed)
Subjective:    Patient ID: Stephanie Woodward, female    DOB: January 25, 1941, 73 y.o.   MRN: [id number]  HPI The patient is a 73 year old female who I have been asked to see for possible obstructive sleep apnea. I have seen her in the distant past for dyspnea on exertion, where PFTs showed minimal airflow limitation. She really did not have a big response to inhaled bronchodilators, and the majority of her dyspnea was felt related to her obesity, deconditioning, and diastolic dysfunction. I have not seen her since 2011, and she was recently admitted with decompensated congestive heart failure and respiratory failure. Her chest x-ray showed pulmonary edema and pleural effusions, and she responded quite well to aggressive diuresis. The question was raised whether she may have sleep apnea that is contributing to her underlying heart failure. The patient does have loud snoring, and awakens during the night with apneic events. She has frequent awakenings at night, and does not feel rested in the mornings upon arising. She has definite sleepiness during the day with periods of inactivity, and also in the evening. She denies any sleepiness with driving. The patient states that her weight is up 20 pounds over the last 2 years, and her Epworth score today is 8    Sleep Questionnaire What time do you typically go to bed?( Between what hours) 10p-12a 10p-12a at 1532 on 01/22/14 by Inge Rise, [place] How long does it take you to fall asleep? 2 hrs or more 2 hrs or more at 1532 on 01/22/14 by Inge Rise, CMA How many times during the night do you wake up? 3 3 at 1532 on 01/22/14 by Inge Rise, [place] What time do you get out of bed to start your day? 0700 0700 at 1532 on 01/22/14 by Inge Rise, CMA Do you drive or operate heavy machinery in your occupation? No No at 1532 on 01/22/14 by Inge Rise, CMA How much has your weight changed (up or down) over the past two years? (In pounds) 20 lb (9.072  kg) 20 lb (9.072 kg) at 1532 on 01/22/14 by Inge Rise, CMA Have you ever had a sleep study before? No No at 1532 on 01/22/14 by Inge Rise, CMA Do you currently use CPAP? No No at 1532 on 01/22/14 by Inge Rise, CMA Do you wear oxygen at any time? No No at 1532 on 01/22/14 by Inge Rise, CMA   Review of Systems  Constitutional: Negative for fever and unexpected weight change.  HENT: Negative for congestion, dental problem, ear pain, nosebleeds, postnasal drip, rhinorrhea, sinus pressure, sneezing, sore throat and trouble swallowing.   Eyes: Negative for redness and itching.  Respiratory: Positive for cough and shortness of breath. Negative for chest tightness and wheezing.   Cardiovascular: Positive for palpitations and leg swelling.  Gastrointestinal: Negative for nausea and vomiting.  Genitourinary: Negative for dysuria.  Musculoskeletal: Negative for joint swelling.  Skin: Negative for rash.  Neurological: Negative for headaches.  Hematological: Does not bruise/bleed easily.  Psychiatric/Behavioral: Negative for dysphoric mood. The patient is not nervous/anxious.        Objective:   Physical Exam Constitutional:  Obese female, no acute distress  HENT:  Nares patent without discharge  Oropharynx without exudate, palate and uvula are thick and elongated.  Eyes:  Perrla, eomi, no scleral icterus  Neck:  No JVD, no TMG  Cardiovascular:  Normal rate, ? irregular rhythm, no rubs or gallops.  No murmurs  Intact distal pulses  Pulmonary :  Normal breath sounds, no stridor or respiratory distress   No rales, rhonchi, or wheezing  Abdominal:  Soft, nondistended, bowel sounds present.  No tenderness noted.   Musculoskeletal:  1+ lower extremity edema noted.  Lymph Nodes:  No cervical lymphadenopathy noted  Skin:  No cyanosis noted  Neurologic:  Alert, appropriate, moves all 4 extremities without obvious deficit.         Assessment & Plan:

## 2014-01-22 NOTE — Assessment & Plan Note (Signed)
The patient's history is extremely suspicious for clinically significant sleep disordered breathing. I have had a long discussion with her about sleep apnea, including its impact to her quality of life and cardiovascular health. I have explained that her sleep apnea may be contributing significantly to her chronic congestive heart failure. At this point, I think we need to order a sleep study for evaluation. I have also encouraged her to work aggressively on weight loss.

## 2014-01-24 ENCOUNTER — Telehealth: Payer: Self-pay | Admitting: Pulmonary Disease

## 2014-01-24 NOTE — Telephone Encounter (Signed)
Called pt and LMTCB x1 

## 2014-01-25 NOTE — Telephone Encounter (Signed)
Let pt know that I am not convinced she has any significant lung disease.  I am concerned that her sob is due to other things, such as heart failure??  If she is not getting any better, and is having chest tightness, she needs to go to the ER for evaluation to make sure there is not a cardiac issue here.

## 2014-01-25 NOTE — Telephone Encounter (Signed)
Called pt on all numbers listed, left message to relay recs.

## 2014-01-25 NOTE — Telephone Encounter (Signed)
Last OV 01-22-14. I spoke with the pt and she states she finished pred taper given by her PCP yesterday. She states she is not feeling any better. She is still having increased SOB with minimal activity and chest tightness. She states this is not worse then it was, just no better. She denies any cough, chest congestion, wheezing, fever at this time.  She has PFT and OV set for 02-08-14. Please advise. [place] Bing, CMA  Allergies  Allergen Reactions  . Eggs Or Egg-Derived Products Nausea And Vomiting  . Lisinopril   . Penicillins Nausea And Vomiting

## 2014-01-28 NOTE — Telephone Encounter (Signed)
I spoke with the pt and she states she is feeling a little better today and has an appt with her cardiologists later today. [place] Bing, CMA

## 2014-01-31 ENCOUNTER — Encounter (HOSPITAL_COMMUNITY): Payer: Self-pay | Admitting: Cardiology

## 2014-02-05 ENCOUNTER — Inpatient Hospital Stay (HOSPITAL_COMMUNITY)
Admission: EM | Admit: 2014-02-05 | Discharge: 2014-02-08 | DRG: 602 | Disposition: A | Discharge status: Home Health | Payer: Medicare Other | Attending: Internal Medicine | Admitting: Internal Medicine

## 2014-02-05 ENCOUNTER — Encounter (HOSPITAL_COMMUNITY): Payer: Self-pay | Admitting: Physical Medicine and Rehabilitation

## 2014-02-05 ENCOUNTER — Emergency Department (HOSPITAL_COMMUNITY): Payer: Medicare Other

## 2014-02-05 DIAGNOSIS — R0602 Shortness of breath: Secondary | ICD-10-CM

## 2014-02-05 DIAGNOSIS — I5033 Acute on chronic diastolic (congestive) heart failure: Secondary | ICD-10-CM

## 2014-02-05 DIAGNOSIS — L03115 Cellulitis of right lower limb: Secondary | ICD-10-CM | POA: Diagnosis present

## 2014-02-05 DIAGNOSIS — I509 Heart failure, unspecified: Secondary | ICD-10-CM

## 2014-02-05 DIAGNOSIS — E1165 Type 2 diabetes mellitus with hyperglycemia: Secondary | ICD-10-CM | POA: Diagnosis present

## 2014-02-05 DIAGNOSIS — I10 Essential (primary) hypertension: Secondary | ICD-10-CM | POA: Diagnosis present

## 2014-02-05 DIAGNOSIS — N183 Chronic kidney disease, stage 3 unspecified: Secondary | ICD-10-CM

## 2014-02-05 DIAGNOSIS — G4733 Obstructive sleep apnea (adult) (pediatric): Secondary | ICD-10-CM

## 2014-02-05 DIAGNOSIS — Z8679 Personal history of other diseases of the circulatory system: Secondary | ICD-10-CM

## 2014-02-05 DIAGNOSIS — E669 Obesity, unspecified: Secondary | ICD-10-CM

## 2014-02-05 DIAGNOSIS — Z888 Allergy status to other drugs, medicaments and biological substances status: Secondary | ICD-10-CM

## 2014-02-05 DIAGNOSIS — J449 Chronic obstructive pulmonary disease, unspecified: Secondary | ICD-10-CM | POA: Diagnosis present

## 2014-02-05 DIAGNOSIS — B373 Candidiasis of vulva and vagina: Secondary | ICD-10-CM | POA: Diagnosis not present

## 2014-02-05 DIAGNOSIS — Z880 Allergy status to penicillin: Secondary | ICD-10-CM

## 2014-02-05 DIAGNOSIS — Z7951 Long term (current) use of inhaled steroids: Secondary | ICD-10-CM

## 2014-02-05 DIAGNOSIS — Z91012 Allergy to eggs: Secondary | ICD-10-CM

## 2014-02-05 DIAGNOSIS — I272 Other secondary pulmonary hypertension: Secondary | ICD-10-CM | POA: Diagnosis present

## 2014-02-05 DIAGNOSIS — R0902 Hypoxemia: Secondary | ICD-10-CM

## 2014-02-05 DIAGNOSIS — I5042 Chronic combined systolic (congestive) and diastolic (congestive) heart failure: Secondary | ICD-10-CM

## 2014-02-05 DIAGNOSIS — E785 Hyperlipidemia, unspecified: Secondary | ICD-10-CM | POA: Diagnosis present

## 2014-02-05 DIAGNOSIS — E1129 Type 2 diabetes mellitus with other diabetic kidney complication: Secondary | ICD-10-CM

## 2014-02-05 DIAGNOSIS — E11649 Type 2 diabetes mellitus with hypoglycemia without coma: Secondary | ICD-10-CM

## 2014-02-05 DIAGNOSIS — M7989 Other specified soft tissue disorders: Secondary | ICD-10-CM | POA: Diagnosis not present

## 2014-02-05 DIAGNOSIS — Z794 Long term (current) use of insulin: Secondary | ICD-10-CM

## 2014-02-05 DIAGNOSIS — E119 Type 2 diabetes mellitus without complications: Secondary | ICD-10-CM

## 2014-02-05 DIAGNOSIS — I5023 Acute on chronic systolic (congestive) heart failure: Secondary | ICD-10-CM | POA: Diagnosis present

## 2014-02-05 DIAGNOSIS — I5022 Chronic systolic (congestive) heart failure: Secondary | ICD-10-CM

## 2014-02-05 DIAGNOSIS — Z87891 Personal history of nicotine dependence: Secondary | ICD-10-CM

## 2014-02-05 HISTORY — DX: Unspecified osteoarthritis, unspecified site: M19.90

## 2014-02-05 HISTORY — DX: Heart failure, unspecified: I50.9

## 2014-02-05 LAB — CBC WITH DIFFERENTIAL/PLATELET
Basophils Absolute: 0 10*3/uL (ref 0.0–0.1)
Basophils Relative: 0 % (ref 0–1)
Eosinophils Absolute: 0.3 10*3/uL (ref 0.0–0.7)
Eosinophils Relative: 3 % (ref 0–5)
HCT: 38.3 % (ref 36.0–46.0)
Hemoglobin: 12 g/dL (ref 12.0–15.0)
Lymphocytes Relative: 19 % (ref 12–46)
Lymphs Abs: 1.9 10*3/uL (ref 0.7–4.0)
MCH: 24.7 pg — ABNORMAL LOW (ref 26.0–34.0)
MCHC: 31.3 g/dL (ref 30.0–36.0)
MCV: 79 fL (ref 78.0–100.0)
Monocytes Absolute: 0.5 10*3/uL (ref 0.1–1.0)
Monocytes Relative: 5 % (ref 3–12)
Neutro Abs: 7.7 10*3/uL (ref 1.7–7.7)
Neutrophils Relative %: 73 % (ref 43–77)
Platelets: 266 10*3/uL (ref 150–400)
RBC: 4.85 MIL/uL (ref 3.87–5.11)
RDW: 16.5 % — ABNORMAL HIGH (ref 11.5–15.5)
WBC: 10.5 10*3/uL (ref 4.0–10.5)

## 2014-02-05 LAB — COMPREHENSIVE METABOLIC PANEL
ALT: 23 U/L (ref 0–35)
AST: 21 U/L (ref 0–37)
Albumin: 2.8 g/dL — ABNORMAL LOW (ref 3.5–5.2)
Alkaline Phosphatase: 118 U/L — ABNORMAL HIGH (ref 39–117)
Anion gap: 12 (ref 5–15)
BUN: 21 mg/dL (ref 6–23)
CO2: 29 mEq/L (ref 19–32)
Calcium: 9.7 mg/dL (ref 8.4–10.5)
Chloride: 102 mEq/L (ref 96–112)
Creatinine, Ser: 1.65 mg/dL — ABNORMAL HIGH (ref 0.50–1.10)
GFR calc Af Amer: 35 mL/min — ABNORMAL LOW (ref >90)
GFR calc non Af Amer: 30 mL/min — ABNORMAL LOW (ref >90)
Glucose, Bld: 78 mg/dL (ref 70–99)
Potassium: 4.2 mEq/L (ref 3.7–5.3)
Sodium: 143 mEq/L (ref 137–147)
Total Bilirubin: 0.4 mg/dL (ref 0.3–1.2)
Total Protein: 7.2 g/dL (ref 6.0–8.3)

## 2014-02-05 LAB — PRO B NATRIURETIC PEPTIDE: Pro B Natriuretic peptide (BNP): 1261 pg/mL — ABNORMAL HIGH (ref 0–125)

## 2014-02-05 LAB — TROPONIN I: Troponin I: <0.3 ng/mL (ref <0.30)

## 2014-02-05 MED ORDER — FUROSEMIDE 10 MG/ML IJ SOLN
40.0000 mg | Freq: Once | INTRAMUSCULAR | Status: AC
  Administered 2014-02-05: 40 mg via INTRAVENOUS
  Filled 2014-02-05: qty 4

## 2014-02-05 MED ORDER — HYDROCODONE-ACETAMINOPHEN 5-325 MG PO TABS
2.0000 | ORAL_TABLET | Freq: Once | ORAL | Status: AC
  Administered 2014-02-05: 2 via ORAL
  Filled 2014-02-05: qty 2

## 2014-02-05 NOTE — ED Notes (Signed)
Tori, PA-C, is at the bedside.

## 2014-02-05 NOTE — ED Notes (Signed)
Daughter has note from home health nurse: "increase in cough, blood sugar running high, new order to increase insulin, pain increased in both legs, redness in calf on right leg, started on magnesium 250mg  daily on Sunday.

## 2014-02-05 NOTE — ED Notes (Signed)
PA at BS.  

## 2014-02-05 NOTE — ED Notes (Signed)
MD at bedside. 

## 2014-02-05 NOTE — ED Provider Notes (Addendum)
Patient presented to the ER with swelling of both legs. She also has noticed redness of the right leg which began earlier today.  Face to face Exam: HEENT - PERRLA Lungs - CTAB Heart - RRR, no M/R/G Abd - S/NT/ND Neuro - alert, oriented x3 Musculoskeletal - bilateral lower extremity edema, no significant calf tenderness Skin - hyperpigmentation, lichenification right lower leg, some mild erythema and warmth  Plan: Patient with what appears to be chronic lower extremity edema presents with redness of the right leg. Might be stasis dermatitis, cannot rule out infection. Will be treated with antibiotics. Feel that DVT is less likely, but will need to rule out. Also has signs of CHF on workup, will diurese, admit to medicine.  Orpah Greek, MD 02/05/14 [id number]  Orpah Greek, MD 02/06/14 [phone]

## 2014-02-05 NOTE — ED Notes (Signed)
Pt states bilateral leg pain and swelling. Ongoing x2 days. History of CHF. Respirations unlabored. Denies pain. Pt is alert and oriented x4.

## 2014-02-05 NOTE — ED Provider Notes (Signed)
CSN: [id number]     Arrival date & time 02/05/14  1727 History   First MD Initiated Contact with Patient 02/05/14 2054     Chief Complaint  Patient presents with  . Leg Swelling     (Consider location/radiation/quality/duration/timing/severity/associated sxs/prior Treatment) HPI  Stephanie Woodward is a 73 y.o. female with PMH of DM, hypertension, COPD, hyperlipidemia, CHF presenting with bilateral leg swelling and tenderness for 5 days with redness circumferentially around right lower leg only. Patient states it is painful to walk on right leg. She has taken tramadol for it and it does help relieve the pain. Patient denies any chest pain or shortness of breath at rest. Patient denies fevers, chills, nausea, vomiting. Patient has had increase in cough the patient states shortness of breath is at baseline. Home health nurse noted that her blood sugars have been running high and she has had an increase in her insulin. Patient is seen by her cardiologist Dr. Wynonia Lawman who she saw 2 weeks ago who increased her Lasix dose to 80 mg in the morning and 40 at night. A shunt denies history of DVT, PE, recent surgery, trauma, history of malignancy, immobility recently.   Past Medical History  Diagnosis Date  . Diabetes mellitus   . Hypertension   . Asthma   . COPD (chronic obstructive pulmonary disease)   . Renal disorder   . Gout   . Hyperlipemia   . Single kidney   . Psoriasis   . CHF (congestive heart failure)    Past Surgical History  Procedure Laterality Date  . Gallbladder  1978  . Fibroid tumor    . Left and right heart catheterization with coronary angiogram N/A 11/29/2013    Procedure: LEFT AND RIGHT HEART CATHETERIZATION WITH CORONARY ANGIOGRAM;  Surgeon: Jacolyn Reedy, MD;  Location: [facility] CATH LAB;  Service: Cardiovascular;  Laterality: N/A;   Family History  Problem Relation Age of Onset  . Lupus Daughter   . Cancer Mother    History  Substance Use Topics  . Smoking status: Former  Smoker -- 1.00 packs/day for 20 years    Types: Cigarettes    Quit date: 02/22/2009  . Smokeless tobacco: Not on file  . Alcohol Use: No     Comment: quit 2011   OB History    No data available     Review of Systems  Constitutional: Negative for fever and chills.  HENT: Negative for congestion and rhinorrhea.   Eyes: Negative for visual disturbance.  Respiratory: Negative for cough and shortness of breath.   Cardiovascular: Negative for chest pain and palpitations.  Gastrointestinal: Negative for nausea, vomiting and diarrhea.  Genitourinary: Negative for dysuria and hematuria.  Musculoskeletal: Negative for back pain and gait problem.  Skin: Negative for rash.  Neurological: Negative for weakness and headaches.      Allergies  Eggs or egg-derived products; Lisinopril; and Penicillins  Home Medications   Prior to Admission medications   Medication Sig Start Date End Date Taking? Authorizing Provider  albuterol (PROVENTIL HFA;VENTOLIN HFA) 108 (90 BASE) MCG/ACT inhaler Inhale 2 puffs into the lungs every 4 (four) hours as needed for shortness of breath.    Yes Historical Provider, MD  amLODipine (NORVASC) 10 MG tablet Take 10 mg by mouth daily.    Yes Historical Provider, MD  atorvastatin (LIPITOR) 80 MG tablet Take 80 mg by mouth daily.   Yes Historical Provider, MD  Fluticasone-Salmeterol (ADVAIR) 250-50 MCG/DOSE AEPB Inhale 1 puff into the lungs 2 (two)  times daily.   Yes Historical Provider, MD  furosemide (LASIX) 40 MG tablet Take 1 tablet (40 mg total) by mouth 2 (two) times daily. Patient taking differently: 2 tablets in the morning and 1 in the PM 12/07/13  Yes Jacolyn Reedy, MD  gabapentin (NEURONTIN) 100 MG capsule Take 100 mg by mouth 3 (three) times daily.   Yes Historical Provider, MD  insulin detemir (LEVEMIR) 100 UNIT/ML injection Inject 0.5 mLs (50 Units total) into the skin daily. 12/07/13  Yes Jacolyn Reedy, MD  latanoprost (XALATAN) 0.005 % ophthalmic  solution Place 1 drop into both eyes at bedtime.   Yes Historical Provider, MD  metoprolol succinate (TOPROL-XL) 25 MG 24 hr tablet Take 25 mg by mouth 2 (two) times daily.    Yes Historical Provider, MD  pantoprazole (PROTONIX) 40 MG tablet Take 40 mg by mouth daily.    Yes Historical Provider, MD  promethazine (PHENERGAN) 25 MG tablet Take 25 mg by mouth every 6 (six) hours as needed for nausea or vomiting.   Yes Historical Provider, MD  saxagliptin HCl (ONGLYZA) 5 MG TABS tablet Take 5 mg by mouth daily.   Yes Historical Provider, MD  tiotropium (SPIRIVA) 18 MCG inhalation capsule Place 18 mcg into inhaler and inhale daily.    Yes Historical Provider, MD  traMADol (ULTRAM) 50 MG tablet Take 50 mg by mouth 2 (two) times daily.   Yes Historical Provider, MD  zolpidem (AMBIEN) 10 MG tablet Take 10 mg by mouth at bedtime as needed for sleep.   Yes Historical Provider, MD   BP 124/50 mmHg  Pulse 66  Temp(Src) 99 F (37.2 C) (Oral)  Resp 18  Ht 5\' 5"  (1.651 m)  Wt 200 lb (90.719 kg)  BMI 33.28 kg/m2  SpO2 95% Physical Exam  Constitutional: She appears well-developed and well-nourished. No distress.  HENT:  Head: Normocephalic and atraumatic.  Eyes: Conjunctivae and EOM are normal. Right eye exhibits no discharge. Left eye exhibits no discharge.  Neck: No JVD present.  Cardiovascular: Normal rate, regular rhythm and normal heart sounds.   Bilateral 2-3+ pitting edema to knees. No knee dated increased swelling on right. Patient with circumferential erythema and warmth to right lower extremity that is tender to touch. 1+ pedal pulses equal bilaterally.  Pulmonary/Chest: Effort normal and breath sounds normal. No respiratory distress. She has no wheezes.  Abdominal: Soft. Bowel sounds are normal. She exhibits no distension. There is no tenderness.  Neurological: She is alert. She exhibits normal muscle tone. Coordination normal.  Patient ambulatory with walker  Skin: Skin is warm and dry. She  is not diaphoretic.  Nursing note and vitals reviewed.   ED Course  Procedures (including critical care time) Labs Review Labs Reviewed  CBC WITH DIFFERENTIAL - Abnormal; Notable for the following:    MCH 24.7 (*)    RDW 16.5 (*)    All other components within normal limits  COMPREHENSIVE METABOLIC PANEL - Abnormal; Notable for the following:    Creatinine, Ser 1.65 (*)    Albumin 2.8 (*)    Alkaline Phosphatase 118 (*)    GFR calc non Af Amer 30 (*)    GFR calc Af Amer 35 (*)    All other components within normal limits  PRO B NATRIURETIC PEPTIDE - Abnormal; Notable for the following:    Pro B Natriuretic peptide (BNP) 1261.0 (*)    All other components within normal limits  TROPONIN I    Imaging Review Dg Chest  2 View  02/05/2014   CLINICAL DATA:  Initial valuation 4 shortness of breath.  EXAM: CHEST  2 VIEW  COMPARISON:  Prior radiograph from 01/11/2014  FINDINGS: Cardiomegaly is stable from prior study. Mediastinal silhouette within normal limits.  There is mild diffuse prominence of the interstitial markings, greatest within the lung bases. Findings suggestive of diffuse pulmonary vascular congestion/mild interstitial edema. Linear opacity within the mid left lung is stable from prior, consistent with atelectasis/scar. A small left pleural effusion noted. No pneumothorax.  No acute osseus abnormality.  IMPRESSION: 1. Stable cardiomegaly with diffuse prominence of the interstitial markings, suggesting mild diffuse interstitial edema. 2. Small left pleural effusion.   Electronically Signed   By: Jeannine Boga M.D.   On: 02/05/2014 23:42     EKG Interpretation   Date/Time:  Tuesday February 05 2014 21:58:13 EST Ventricular Rate:  92 PR Interval:  173 QRS Duration: 120 QT Interval:  402 QTC Calculation: 497 R Axis:   -69 Text Interpretation:  Sinus rhythm Atrial premature complexes Nonspecific  IVCD with LAD Left ventricular hypertrophy Inferior infarct, old Anterior   infarct, old No significant change since last tracing Confirmed by [facility]   MD, [place] [id number]) on 02/05/2014 10:06:20 PM      MDM   Final diagnoses:  History of congestive heart failure  Cellulitis of right lower extremity  CHF exacerbation   Patient with history of CHF presenting with increased bilateral pitting edema, increased cough the patient states shortness of breath is at baseline. Patient denies fevers. Patient also with chronic lower extremity edema with redness, warmth, tenderness and appears to be infected. Patient with possible cellulitis. VSS. Lungs clear to auscultation bilaterally, no JVD, 2-3+ pitting edema to bilateral knees. Patient appears mildly fluid overloaded. Chest x-ray with small left pleural effusion and mild interstitial edema. EKG unchanged BNP 1200 which is increased from 850 for last CHF exacerbation. Troponin negative. Other exams noncontributory. Concern for CHF exacerbation as well as possible cellulitis of chronic lower extremity edema on right. Consideration for DVT study in the morning. Patient given 40 IV Lasix as well as IV clindamycin and pain control. Consult to hospitalist. Spoke with Dr.Kakrakandy who agrees to evaluate the patient with the plan for admission.  Discussed all results and patient verbalizes understanding and agrees with plan.  This is a shared patient. This patient was discussed with the physician, Dr. Betsey Holiday who saw and evaluated the patient and agrees with the plan.     Pura Spice, PA-C 02/06/14 0030  Orpah Greek, MD 02/06/14 1534

## 2014-02-06 ENCOUNTER — Encounter (HOSPITAL_COMMUNITY): Payer: Self-pay | Admitting: Internal Medicine

## 2014-02-06 DIAGNOSIS — Z880 Allergy status to penicillin: Secondary | ICD-10-CM | POA: Diagnosis not present

## 2014-02-06 DIAGNOSIS — I10 Essential (primary) hypertension: Secondary | ICD-10-CM

## 2014-02-06 DIAGNOSIS — Z91012 Allergy to eggs: Secondary | ICD-10-CM | POA: Diagnosis not present

## 2014-02-06 DIAGNOSIS — E1165 Type 2 diabetes mellitus with hyperglycemia: Secondary | ICD-10-CM | POA: Diagnosis present

## 2014-02-06 DIAGNOSIS — E785 Hyperlipidemia, unspecified: Secondary | ICD-10-CM | POA: Diagnosis present

## 2014-02-06 DIAGNOSIS — J449 Chronic obstructive pulmonary disease, unspecified: Secondary | ICD-10-CM | POA: Diagnosis present

## 2014-02-06 DIAGNOSIS — E11649 Type 2 diabetes mellitus with hypoglycemia without coma: Secondary | ICD-10-CM

## 2014-02-06 DIAGNOSIS — L03115 Cellulitis of right lower limb: Secondary | ICD-10-CM | POA: Diagnosis present

## 2014-02-06 DIAGNOSIS — Z888 Allergy status to other drugs, medicaments and biological substances status: Secondary | ICD-10-CM | POA: Diagnosis not present

## 2014-02-06 DIAGNOSIS — Z794 Long term (current) use of insulin: Secondary | ICD-10-CM | POA: Diagnosis not present

## 2014-02-06 DIAGNOSIS — I5023 Acute on chronic systolic (congestive) heart failure: Secondary | ICD-10-CM | POA: Diagnosis present

## 2014-02-06 DIAGNOSIS — I509 Heart failure, unspecified: Secondary | ICD-10-CM

## 2014-02-06 DIAGNOSIS — M7989 Other specified soft tissue disorders: Secondary | ICD-10-CM | POA: Diagnosis present

## 2014-02-06 DIAGNOSIS — Z87891 Personal history of nicotine dependence: Secondary | ICD-10-CM | POA: Diagnosis not present

## 2014-02-06 DIAGNOSIS — E119 Type 2 diabetes mellitus without complications: Secondary | ICD-10-CM

## 2014-02-06 DIAGNOSIS — I272 Other secondary pulmonary hypertension: Secondary | ICD-10-CM | POA: Diagnosis present

## 2014-02-06 DIAGNOSIS — Z7951 Long term (current) use of inhaled steroids: Secondary | ICD-10-CM | POA: Diagnosis not present

## 2014-02-06 DIAGNOSIS — L0390 Cellulitis, unspecified: Secondary | ICD-10-CM | POA: Insufficient documentation

## 2014-02-06 DIAGNOSIS — B373 Candidiasis of vulva and vagina: Secondary | ICD-10-CM | POA: Diagnosis not present

## 2014-02-06 DIAGNOSIS — I5022 Chronic systolic (congestive) heart failure: Secondary | ICD-10-CM

## 2014-02-06 LAB — GLUCOSE, CAPILLARY
Glucose-Capillary: 216 mg/dL — ABNORMAL HIGH (ref 70–99)
Glucose-Capillary: 115 mg/dL — ABNORMAL HIGH (ref 70–99)
Glucose-Capillary: 263 mg/dL — ABNORMAL HIGH (ref 70–99)
Glucose-Capillary: 143 mg/dL — ABNORMAL HIGH (ref 70–99)
Glucose-Capillary: 79 mg/dL (ref 70–99)

## 2014-02-06 MED ORDER — LINAGLIPTIN 5 MG PO TABS
5.0000 mg | ORAL_TABLET | Freq: Every day | ORAL | Status: DC
  Administered 2014-02-06 – 2014-02-08 (×3): 5 mg via ORAL
  Filled 2014-02-06 (×3): qty 1

## 2014-02-06 MED ORDER — VANCOMYCIN HCL IN DEXTROSE 1-5 GM/200ML-% IV SOLN
1000.0000 mg | INTRAVENOUS | Status: DC
  Administered 2014-02-07: 1000 mg via INTRAVENOUS
  Filled 2014-02-06: qty 200

## 2014-02-06 MED ORDER — ACETAMINOPHEN 650 MG RE SUPP: 650.0000 mg | Freq: Four times a day (QID) | RECTAL | Status: DC | PRN

## 2014-02-06 MED ORDER — FUROSEMIDE 10 MG/ML IJ SOLN
40.0000 mg | Freq: Two times a day (BID) | INTRAMUSCULAR | Status: DC
  Administered 2014-02-06: 40 mg via INTRAVENOUS
  Filled 2014-02-06 (×2): qty 4

## 2014-02-06 MED ORDER — TIOTROPIUM BROMIDE MONOHYDRATE 18 MCG IN CAPS
18.0000 ug | ORAL_CAPSULE | Freq: Every day | RESPIRATORY_TRACT | Status: DC
  Administered 2014-02-06 – 2014-02-08 (×2): 18 ug via RESPIRATORY_TRACT
  Filled 2014-02-06: qty 5

## 2014-02-06 MED ORDER — METOPROLOL SUCCINATE ER 25 MG PO TB24
25.0000 mg | ORAL_TABLET | Freq: Two times a day (BID) | ORAL | Status: DC
  Administered 2014-02-06 – 2014-02-08 (×6): 25 mg via ORAL
  Filled 2014-02-06 (×7): qty 1

## 2014-02-06 MED ORDER — ONDANSETRON HCL 4 MG/2ML IJ SOLN: 4.0000 mg | Freq: Four times a day (QID) | INTRAMUSCULAR | Status: DC | PRN

## 2014-02-06 MED ORDER — MORPHINE SULFATE 2 MG/ML IJ SOLN: 1.0000 mg | INTRAMUSCULAR | Status: DC | PRN

## 2014-02-06 MED ORDER — GUAIFENESIN-DM 100-10 MG/5ML PO SYRP
5.0000 mL | ORAL_SOLUTION | ORAL | Status: DC | PRN
  Administered 2014-02-06: 5 mL via ORAL
  Filled 2014-02-06: qty 5

## 2014-02-06 MED ORDER — ZOLPIDEM TARTRATE 5 MG PO TABS: 5.0000 mg | ORAL_TABLET | Freq: Every evening | ORAL | Status: DC | PRN

## 2014-02-06 MED ORDER — ACETAMINOPHEN 325 MG PO TABS
650.0000 mg | ORAL_TABLET | Freq: Four times a day (QID) | ORAL | Status: DC | PRN
  Administered 2014-02-06 – 2014-02-07 (×2): 650 mg via ORAL
  Filled 2014-02-06 (×3): qty 2

## 2014-02-06 MED ORDER — PANTOPRAZOLE SODIUM 40 MG PO TBEC
40.0000 mg | DELAYED_RELEASE_TABLET | Freq: Every day | ORAL | Status: DC
  Administered 2014-02-06 – 2014-02-08 (×3): 40 mg via ORAL
  Filled 2014-02-06 (×4): qty 1

## 2014-02-06 MED ORDER — ALBUTEROL SULFATE (2.5 MG/3ML) 0.083% IN NEBU: 3.0000 mL | INHALATION_SOLUTION | RESPIRATORY_TRACT | Status: DC | PRN

## 2014-02-06 MED ORDER — MOMETASONE FURO-FORMOTEROL FUM 100-5 MCG/ACT IN AERO
2.0000 | INHALATION_SPRAY | Freq: Two times a day (BID) | RESPIRATORY_TRACT | Status: DC
  Administered 2014-02-06 – 2014-02-08 (×4): 2 via RESPIRATORY_TRACT
  Filled 2014-02-06: qty 8.8

## 2014-02-06 MED ORDER — PROMETHAZINE HCL 25 MG PO TABS: 25.0000 mg | ORAL_TABLET | Freq: Four times a day (QID) | ORAL | Status: DC | PRN

## 2014-02-06 MED ORDER — GABAPENTIN 100 MG PO CAPS
100.0000 mg | ORAL_CAPSULE | Freq: Three times a day (TID) | ORAL | Status: DC
  Administered 2014-02-06 – 2014-02-08 (×7): 100 mg via ORAL
  Filled 2014-02-06 (×9): qty 1

## 2014-02-06 MED ORDER — AMLODIPINE BESYLATE 10 MG PO TABS
10.0000 mg | ORAL_TABLET | Freq: Every day | ORAL | Status: DC
  Administered 2014-02-06: 10 mg via ORAL
  Filled 2014-02-06 (×2): qty 1

## 2014-02-06 MED ORDER — LATANOPROST 0.005 % OP SOLN
1.0000 [drp] | Freq: Every day | OPHTHALMIC | Status: DC
  Administered 2014-02-06 – 2014-02-07 (×3): 1 [drp] via OPHTHALMIC
  Filled 2014-02-06 (×2): qty 2.5

## 2014-02-06 MED ORDER — VANCOMYCIN HCL 10 G IV SOLR
1500.0000 mg | Freq: Once | INTRAVENOUS | Status: AC
  Administered 2014-02-06: 1500 mg via INTRAVENOUS
  Filled 2014-02-06: qty 1500

## 2014-02-06 MED ORDER — INSULIN DETEMIR 100 UNIT/ML ~~LOC~~ SOLN
50.0000 [IU] | Freq: Every day | SUBCUTANEOUS | Status: DC
  Administered 2014-02-06 – 2014-02-08 (×3): 50 [IU] via SUBCUTANEOUS
  Filled 2014-02-06 (×3): qty 0.5

## 2014-02-06 MED ORDER — INSULIN ASPART 100 UNIT/ML ~~LOC~~ SOLN
0.0000 [IU] | Freq: Three times a day (TID) | SUBCUTANEOUS | Status: DC
  Administered 2014-02-06: 1 [IU] via SUBCUTANEOUS
  Administered 2014-02-06: 5 [IU] via SUBCUTANEOUS
  Administered 2014-02-07: 2 [IU] via SUBCUTANEOUS
  Administered 2014-02-08: 3 [IU] via SUBCUTANEOUS
  Administered 2014-02-08: 1 [IU] via SUBCUTANEOUS

## 2014-02-06 MED ORDER — ENOXAPARIN SODIUM 40 MG/0.4ML ~~LOC~~ SOLN
40.0000 mg | SUBCUTANEOUS | Status: DC
  Administered 2014-02-06 – 2014-02-08 (×3): 40 mg via SUBCUTANEOUS
  Filled 2014-02-06 (×3): qty 0.4

## 2014-02-06 MED ORDER — ONDANSETRON HCL 4 MG PO TABS: 4.0000 mg | ORAL_TABLET | Freq: Four times a day (QID) | ORAL | Status: DC | PRN

## 2014-02-06 MED ORDER — SODIUM CHLORIDE 0.9 % IJ SOLN
3.0000 mL | Freq: Two times a day (BID) | INTRAMUSCULAR | Status: DC
  Administered 2014-02-06 – 2014-02-08 (×6): 3 mL via INTRAVENOUS

## 2014-02-06 MED ORDER — ATORVASTATIN CALCIUM 80 MG PO TABS
80.0000 mg | ORAL_TABLET | Freq: Every day | ORAL | Status: DC
  Administered 2014-02-06 – 2014-02-08 (×3): 80 mg via ORAL
  Filled 2014-02-06 (×3): qty 1

## 2014-02-06 MED ORDER — TRAMADOL HCL 50 MG PO TABS
50.0000 mg | ORAL_TABLET | Freq: Two times a day (BID) | ORAL | Status: DC
  Administered 2014-02-06 – 2014-02-08 (×6): 50 mg via ORAL
  Filled 2014-02-06 (×6): qty 1

## 2014-02-06 MED ORDER — CLINDAMYCIN PHOSPHATE 600 MG/50ML IV SOLN
600.0000 mg | Freq: Once | INTRAVENOUS | Status: AC
  Administered 2014-02-06: 600 mg via INTRAVENOUS
  Filled 2014-02-06: qty 50

## 2014-02-06 NOTE — Care Management Note (Addendum)
    Page 1 of 1   02/08/2014     3:41:12 PM CARE MANAGEMENT NOTE 02/08/2014  Patient:  Stephanie Woodward, Stephanie Woodward   Account Number:  [id number]  Date Initiated:  02/06/2014  Documentation initiated by:  Mariann Laster  Subjective/Objective Assessment:   CHF, Cellulitis     Action/Plan:   CM to follow for disposition needs   Anticipated DC Date:  02/09/2014   Anticipated DC Plan:  [place]  CM consult      [facility] Choice  Resumption Of Svcs/PTA Provider   Choice offered to / List presented to:  NA        HH arranged  HH-1 RN  [place]      [place] agency  [facility]   Status of service:  Completed, signed off Medicare Important Message given?  YES (If response is "NO", the following Medicare IM given date fields will be blank) Date Medicare IM given:  02/08/2014 Medicare IM given by:  Layana Konkel Date Additional Medicare IM given:   Additional Medicare IM given by:    Discharge Disposition:  [place]  Per UR Regulation:  Reviewed for med. necessity/level of care/duration of stay  If discussed at [place] of Stay Meetings, dates discussed:    Comments:  Corney Knighton RN, BSN, MSHL, CCM  Nurse - Case Manager,  (Unit [place])  [phone]  02/06/2014

## 2014-02-06 NOTE — Progress Notes (Signed)
Patient ID: Stephanie Woodward  female  [id number]    DOB: 04-23-1940    DOA: 02/05/2014  PCP: Donnie Coffin, MD  Patient is an 73 year old female with history of chronic systolic CHF, pulmonary hypertension, COPD, diabetes presented to ED with worsening swelling and pain in the right lower extremity. Patient reported pain and swelling over the last 4 days, denied any trauma. In ED patient was found to have erythema extending up to her knee with swelling worse on the right more than left. Patient was given Lasix, and placed on IV antibiotics.  Assessment/Plan: Principal Problem:   Cellulitis of right lower extremity - Patient was placed on IV vancomycin and cefepime. - Per patient, already improving, follow Doppler ultrasound to rule out any DVT  Active Problems:   CHF (congestive heart failure): Chronic systolic - Recent cardiac catheter done in October 2015 showed decreased LV function, for now continue IV Lasix due to significant lower extremity edema    Essential hypertension -Currently stable, continue current medications   Diabetes mellitus type 2, uncontrolled - Continue sliding scale insulin, Levemir, tradjenta  Hyperlipidemia Continue Lipitor   DVT Prophylaxis: Lovenox  Code Status:Full code   Family Communication:  Disposition:  Consultants None  Procedures none  Antibiotics biotics: IV vancomycin  Subjective : Patient seen and examined, states that she is feeling whole lot better with Lasix and antibiotics, much decreased redness on the right lower extremity  Objective : Weight change:   Intake/Output Summary (Last 24 hours) at 02/06/14 1158 Last data filed at 02/06/14 1033  Gross per 24 hour  Intake   1153 ml  Output    850 ml  Net    303 ml   Blood pressure 133/61, pulse 69, temperature 98.4 F (36.9 C), temperature source Oral, resp. rate 20, height 5\' 2"  (1.575 m), weight 89.8 kg (197 lb 15.6 oz), SpO2 97 %.  Physical Exam: General: Alert  and awake, oriented x3, not in any acute distress. CVS: S1-S2 clear, no murmur rubs or gallops Chest: clear to auscultation bilaterally, no wheezing, rales or rhonchi Abdomen: soft nontender, nondistended, normal bowel sounds  Extremities: no cyanosis, clubbing or edema noted bilaterally, Right knee erythema with chronic skin changes Neuro: Cranial nerves II-XII intact, no focal neurological deficits  Lab Results: Basic Metabolic Panel:  Recent Labs Lab 02/05/14 1735  NA 143  K 4.2  CL 102  CO2 29  GLUCOSE 78  BUN 21  CREATININE 1.65*  CALCIUM 9.7   Liver Function Tests:  Recent Labs Lab 02/05/14 1735  AST 21  ALT 23  ALKPHOS 118*  BILITOT 0.4  PROT 7.2  ALBUMIN 2.8*   No results for input(s): LIPASE, AMYLASE in the last 168 hours. No results for input(s): AMMONIA in the last 168 hours. CBC:  Recent Labs Lab 02/05/14 1735  WBC 10.5  NEUTROABS 7.7  HGB 12.0  HCT 38.3  MCV 79.0  PLT 266   Cardiac Enzymes:  Recent Labs Lab 02/05/14 2145  TROPONINI <0.30   BNP: Invalid input(s): POCBNP CBG:  Recent Labs Lab 02/06/14 0151 02/06/14 0628 02/06/14 1106  GLUCAP 216* 115* 263*     Micro Results: No results found for this or any previous visit (from the past 240 hour(s)).  Studies/Results: Dg Chest 2 View  02/05/2014   CLINICAL DATA:  Initial valuation 4 shortness of breath.  EXAM: CHEST  2 VIEW  COMPARISON:  Prior radiograph from 01/11/2014  FINDINGS: Cardiomegaly is stable from prior study. Mediastinal silhouette within normal  limits.  There is mild diffuse prominence of the interstitial markings, greatest within the lung bases. Findings suggestive of diffuse pulmonary vascular congestion/mild interstitial edema. Linear opacity within the mid left lung is stable from prior, consistent with atelectasis/scar. A small left pleural effusion noted. No pneumothorax.  No acute osseus abnormality.  IMPRESSION: 1. Stable cardiomegaly with diffuse prominence of  the interstitial markings, suggesting mild diffuse interstitial edema. 2. Small left pleural effusion.   Electronically Signed   By: Jeannine Boga M.D.   On: 02/05/2014 23:42   Dg Chest 2 View  01/11/2014   CLINICAL DATA:  Acute shortness of breath; 1 month history of severe cough; history of COPD and asthma as well as CHF, diabetes, and hypertension.  EXAM: CHEST  2 VIEW  COMPARISON:  PA and lateral chest of December 05, 2013  FINDINGS: The lungs are adequately inflated. The pulmonary interstitial markings remain increased. The pulmonary vascularity is engorged. The cardiopericardial silhouette is enlarged. There small bilateral pleural effusions not greatly changed from the previous study. The trachea is midline. The bony thorax is unremarkable.  IMPRESSION: Congestive heart failure with pulmonary interstitial edema and small bilateral pleural effusions. There has not been significant interval change since the earlier study.   Electronically Signed   By: David  Martinique   On: 01/11/2014 15:31    Medications: Scheduled Meds: . amLODipine  10 mg Oral Daily  . atorvastatin  80 mg Oral Daily  . enoxaparin (LOVENOX) injection  40 mg Subcutaneous Q24H  . furosemide  40 mg Intravenous Q12H  . gabapentin  100 mg Oral TID  . insulin aspart  0-9 Units Subcutaneous TID WC  . insulin detemir  50 Units Subcutaneous Daily  . latanoprost  1 drop Both Eyes QHS  . linagliptin  5 mg Oral Daily  . metoprolol succinate  25 mg Oral BID  . mometasone-formoterol  2 puff Inhalation BID  . pantoprazole  40 mg Oral Daily  . sodium chloride  3 mL Intravenous Q12H  . tiotropium  18 mcg Inhalation Daily  . traMADol  50 mg Oral BID  . [START ON 02/07/2014] vancomycin  1,000 mg Intravenous Q24H      LOS: 1 day   RAI,RIPUDEEP M.D. [facility] 02/06/2014, 11:58 AM Pager: [phone]  If 7PM-7AM, please contact night-coverage [email or web address] Password [email or web address]

## 2014-02-06 NOTE — H&P (Signed)
[facility] History and Physical  Stephanie Woodward [id number] DOB: November 30, 1940 DOA: 02/05/2014  Referring physician: ER physician. PCP: Donnie Coffin, MD   Chief Complaint: Right lower extremity swelling and pain.  HPI: Stephanie Woodward is a 73 y.o. female with history of CHF, systolic, pulmonary hypertension, COPD, diabetes mellitus2 presents to the ER because of worsening swelling and pain in the right lower extremity. Patient stated that patient has benign pain and swelling over the last 4 days. Denies any trauma. Denies any fever or chills. In the ER patient on exam was found to have erythema extending up to her knee with swelling worse on the right side more than left thigh. Patient denies any shortness of breath and denies having gained any weight from her baseline. Patient has been given Lasix 40 mg IV 1 dose and empiric antibiotics for cellulitis and admitted for further management. Patient denies any chest pain or productive cough nausea vomiting abdominal pain or diarrhea.   Review of Systems: As presented in the history of presenting illness, rest negative.  Past Medical History  Diagnosis Date  . Diabetes mellitus   . Hypertension   . Asthma   . COPD (chronic obstructive pulmonary disease)   . Renal disorder   . Gout   . Hyperlipemia   . Single kidney   . Psoriasis   . CHF (congestive heart failure)    Past Surgical History  Procedure Laterality Date  . Gallbladder  1978  . Fibroid tumor    . Left and right heart catheterization with coronary angiogram N/A 11/29/2013    Procedure: LEFT AND RIGHT HEART CATHETERIZATION WITH CORONARY ANGIOGRAM;  Surgeon: Jacolyn Reedy, MD;  Location: [facility] CATH LAB;  Service: Cardiovascular;  Laterality: N/A;   Social History:  reports that she quit smoking about 4 years ago. Her smoking use included Cigarettes. She has a 20 pack-year smoking history. She does not have any smokeless tobacco history on file. She reports that she does  not drink alcohol or use illicit drugs. Where does patient live home. Can patient participate in ADLs? Yes.  Allergies  Allergen Reactions  . Eggs Or Egg-Derived Products Nausea And Vomiting  . Lisinopril   . Penicillins Nausea And Vomiting    Family History:  Family History  Problem Relation Age of Onset  . Lupus Daughter   . Cancer Mother       Prior to Admission medications   Medication Sig Start Date End Date Taking? Authorizing Provider  albuterol (PROVENTIL HFA;VENTOLIN HFA) 108 (90 BASE) MCG/ACT inhaler Inhale 2 puffs into the lungs every 4 (four) hours as needed for shortness of breath.    Yes Historical Provider, MD  amLODipine (NORVASC) 10 MG tablet Take 10 mg by mouth daily.    Yes Historical Provider, MD  atorvastatin (LIPITOR) 80 MG tablet Take 80 mg by mouth daily.   Yes Historical Provider, MD  Fluticasone-Salmeterol (ADVAIR) 250-50 MCG/DOSE AEPB Inhale 1 puff into the lungs 2 (two) times daily.   Yes Historical Provider, MD  furosemide (LASIX) 40 MG tablet Take 1 tablet (40 mg total) by mouth 2 (two) times daily. Patient taking differently: 2 tablets in the morning and 1 in the PM 12/07/13  Yes Jacolyn Reedy, MD  gabapentin (NEURONTIN) 100 MG capsule Take 100 mg by mouth 3 (three) times daily.   Yes Historical Provider, MD  insulin detemir (LEVEMIR) 100 UNIT/ML injection Inject 0.5 mLs (50 Units total) into the skin daily. 12/07/13  Yes Jacolyn Reedy, MD  latanoprost (XALATAN) 0.005 % ophthalmic solution Place 1 drop into both eyes at bedtime.   Yes Historical Provider, MD  metoprolol succinate (TOPROL-XL) 25 MG 24 hr tablet Take 25 mg by mouth 2 (two) times daily.    Yes Historical Provider, MD  pantoprazole (PROTONIX) 40 MG tablet Take 40 mg by mouth daily.    Yes Historical Provider, MD  promethazine (PHENERGAN) 25 MG tablet Take 25 mg by mouth every 6 (six) hours as needed for nausea or vomiting.   Yes Historical Provider, MD  saxagliptin HCl (ONGLYZA) 5 MG  TABS tablet Take 5 mg by mouth daily.   Yes Historical Provider, MD  tiotropium (SPIRIVA) 18 MCG inhalation capsule Place 18 mcg into inhaler and inhale daily.    Yes Historical Provider, MD  traMADol (ULTRAM) 50 MG tablet Take 50 mg by mouth 2 (two) times daily.   Yes Historical Provider, MD  zolpidem (AMBIEN) 10 MG tablet Take 10 mg by mouth at bedtime as needed for sleep.   Yes Historical Provider, MD    Physical Exam: Filed Vitals:   02/06/14 0015 02/06/14 0030 02/06/14 0045 02/06/14 0117  BP: 130/50 116/60 129/47 142/71  Pulse: 69 62 66 72  Temp:    98.5 F (36.9 C)  TempSrc:    Oral  Resp: 19 17 19 20   Height:    5\' 2"  (1.575 m)  Weight:    89.8 kg (197 lb 15.6 oz)  SpO2: 94% 95% 97% 98%     General:  Well-developed and nourished.  Eyes: Anicteric no pallor.  ENT: No discharge from the ears eyes nose more.  Neck: No mass felt. JVD not appreciated.  Cardiovascular: S1-S2 heard.  Respiratory: No rhonchi or crepitations.  Abdomen: Soft nontender bowel sounds present.  Skin: Erythema extending up to the right knee from the foot. This comes chronic skin changes on the medial aspect of the shin.  Musculoskeletal: Edema both lower extreme on the right side.  Psychiatric: Appears normal.  Neurologic: Alert awake oriented to time place and person. Moves all extremities.  Labs on Admission:  Basic Metabolic Panel:  Recent Labs Lab 02/05/14 1735  NA 143  K 4.2  CL 102  CO2 29  GLUCOSE 78  BUN 21  CREATININE 1.65*  CALCIUM 9.7   Liver Function Tests:  Recent Labs Lab 02/05/14 1735  AST 21  ALT 23  ALKPHOS 118*  BILITOT 0.4  PROT 7.2  ALBUMIN 2.8*   No results for input(s): LIPASE, AMYLASE in the last 168 hours. No results for input(s): AMMONIA in the last 168 hours. CBC:  Recent Labs Lab 02/05/14 1735  WBC 10.5  NEUTROABS 7.7  HGB 12.0  HCT 38.3  MCV 79.0  PLT 266   Cardiac Enzymes:  Recent Labs Lab 02/05/14 2145  TROPONINI <0.30     BNP (last 3 results)  Recent Labs  12/05/13 1527 02/05/14 1735  PROBNP 868.9* 1261.0*   CBG: No results for input(s): GLUCAP in the last 168 hours.  Radiological Exams on Admission: Dg Chest 2 View  02/05/2014   CLINICAL DATA:  Initial valuation 4 shortness of breath.  EXAM: CHEST  2 VIEW  COMPARISON:  Prior radiograph from 01/11/2014  FINDINGS: Cardiomegaly is stable from prior study. Mediastinal silhouette within normal limits.  There is mild diffuse prominence of the interstitial markings, greatest within the lung bases. Findings suggestive of diffuse pulmonary vascular congestion/mild interstitial edema. Linear opacity within the mid left lung is stable from prior, consistent with  atelectasis/scar. A small left pleural effusion noted. No pneumothorax.  No acute osseus abnormality.  IMPRESSION: 1. Stable cardiomegaly with diffuse prominence of the interstitial markings, suggesting mild diffuse interstitial edema. 2. Small left pleural effusion.   Electronically Signed   By: Jeannine Boga M.D.   On: 02/05/2014 23:42    EKG: Independently reviewed. Normal sinus rhythm with atrial premature complexes.  Assessment/Plan Principal Problem:   Cellulitis of right lower extremity Active Problems:   CHF (congestive heart failure)   Essential hypertension   Hyperlipidemia   Diabetes mellitus type 2, controlled   Cellulitis   1. Cellulitis of the right lower extremity - patient has been placed on vancomycin and cefepime for now. Patient does not have any-acute ischemic changes. Closely observe for any worsening and at this time does not have any signs of compartment syndrome. Check Dopplers to rule out DVT. 2. CHF systolic recent cardiac cath done in October 2015 showed decreased LV function - continue Lasix for now will keep on IV Lasix as patient does have significant lower extremity edema. 3. COPD - presently not wheezing. 4. Diabetes mellitus type 2 - continue home  medications. 5. Hypertension - continue home medications. 6. Pulmonary hypertension per recent cardiac cath. 7. Hyperlipidemia - continue home medications.    Code Status: Full code.  Family Communication: None.  Disposition Plan: Admit to inpatient.    Baylie Drakes N. [facility] Pager [phone].  If 7PM-7AM, please contact night-coverage [email or web address] Password [email or web address] 02/06/2014, 1:57 AM

## 2014-02-06 NOTE — Progress Notes (Signed)
UR completed Sequan Auxier K. Elanore Talcott, RN, BSN, [place], CCM  02/06/2014 12:09 PM

## 2014-02-06 NOTE — Progress Notes (Signed)
ANTIBIOTIC CONSULT NOTE - INITIAL  Pharmacy Consult for Vancomycin  Indication: cellulitis  Allergies  Allergen Reactions  . Eggs Or Egg-Derived Products Nausea And Vomiting  . Lisinopril   . Penicillins Nausea And Vomiting    Patient Measurements: Height: 5\' 2"  (157.5 cm) Weight: 197 lb 15.6 oz (89.8 kg) IBW/kg (Calculated) : 50.1 Adjusted Body Weight: 70 kg  Vital Signs: Temp: 98.5 F (36.9 C) (12/16 0117) Temp Source: Oral (12/16 0117) BP: 142/71 mmHg (12/16 0117) Pulse Rate: 72 (12/16 0117) Intake/Output from previous day: 12/15 0701 - 12/16 0700 In: 50 [I.V.:50] Out: 400 [Urine:400] Intake/Output from this shift: Total I/O In: 50 [I.V.:50] Out: 400 [Urine:400]  Labs:  Recent Labs  02/05/14 1735  WBC 10.5  HGB 12.0  PLT 266  CREATININE 1.65*   Estimated Creatinine Clearance: 32.6 mL/min (by C-G formula based on Cr of 1.65). No results for input(s): VANCOTROUGH, VANCOPEAK, VANCORANDOM, GENTTROUGH, GENTPEAK, GENTRANDOM, TOBRATROUGH, TOBRAPEAK, TOBRARND, AMIKACINPEAK, AMIKACINTROU, AMIKACIN in the last 72 hours.   Microbiology: No results found for this or any previous visit (from the past 720 hour(s)).  Medical History: Past Medical History  Diagnosis Date  . Diabetes mellitus   . Hypertension   . Asthma   . COPD (chronic obstructive pulmonary disease)   . Renal disorder   . Gout   . Hyperlipemia   . Single kidney   . Psoriasis   . CHF (congestive heart failure)     Medications:  Prescriptions prior to admission  Medication Sig Dispense Refill Last Dose  . albuterol (PROVENTIL HFA;VENTOLIN HFA) 108 (90 BASE) MCG/ACT inhaler Inhale 2 puffs into the lungs every 4 (four) hours as needed for shortness of breath.    unknown  . amLODipine (NORVASC) 10 MG tablet Take 10 mg by mouth daily.    02/05/2014 at Unknown time  . atorvastatin (LIPITOR) 80 MG tablet Take 80 mg by mouth daily.   02/05/2014 at Unknown time  . Fluticasone-Salmeterol (ADVAIR) 250-50  MCG/DOSE AEPB Inhale 1 puff into the lungs 2 (two) times daily.   02/05/2014 at Unknown time  . furosemide (LASIX) 40 MG tablet Take 1 tablet (40 mg total) by mouth 2 (two) times daily. (Patient taking differently: 2 tablets in the morning and 1 in the PM) 60 tablet 12 02/04/2014 at Unknown time  . gabapentin (NEURONTIN) 100 MG capsule Take 100 mg by mouth 3 (three) times daily.   02/05/2014 at Unknown time  . insulin detemir (LEVEMIR) 100 UNIT/ML injection Inject 0.5 mLs (50 Units total) into the skin daily. 10 mL 11 02/05/2014 at Unknown time  . latanoprost (XALATAN) 0.005 % ophthalmic solution Place 1 drop into both eyes at bedtime.   02/04/2014 at Unknown time  . metoprolol succinate (TOPROL-XL) 25 MG 24 hr tablet Take 25 mg by mouth 2 (two) times daily.    02/05/2014 at 0800  . pantoprazole (PROTONIX) 40 MG tablet Take 40 mg by mouth daily.    02/05/2014 at Unknown time  . promethazine (PHENERGAN) 25 MG tablet Take 25 mg by mouth every 6 (six) hours as needed for nausea or vomiting.   unknown  . saxagliptin HCl (ONGLYZA) 5 MG TABS tablet Take 5 mg by mouth daily.   02/05/2014 at Unknown time  . tiotropium (SPIRIVA) 18 MCG inhalation capsule Place 18 mcg into inhaler and inhale daily.    02/05/2014 at Unknown time  . traMADol (ULTRAM) 50 MG tablet Take 50 mg by mouth 2 (two) times daily.   02/05/2014 at Unknown  time  . zolpidem (AMBIEN) 10 MG tablet Take 10 mg by mouth at bedtime as needed for sleep.   02/04/2014 at Unknown time   Assessment: 73 yo female with possible LE cellulitis for empiric antibiotics  Goal of Therapy:  Vancomycin trough level 10-15 mcg/ml  Plan:  Vancomycin 1500 mg IV now, then vancomycin 1 g IV q24h  Lonzie Simmer, Bronson Curb 02/06/2014,1:58 AM

## 2014-02-06 NOTE — Plan of Care (Signed)
Problem: Phase I Progression Outcomes Goal: EF % per last Echo/documented,Core Reminder form on chart Outcome: Completed/Met Date Met:  02/06/14 EF 45-50% per ECHO performed on 12/06/13.

## 2014-02-07 ENCOUNTER — Encounter (HOSPITAL_COMMUNITY): Payer: Self-pay | Admitting: General Practice

## 2014-02-07 DIAGNOSIS — R609 Edema, unspecified: Secondary | ICD-10-CM

## 2014-02-07 DIAGNOSIS — E785 Hyperlipidemia, unspecified: Secondary | ICD-10-CM

## 2014-02-07 LAB — GLUCOSE, CAPILLARY
Glucose-Capillary: 104 mg/dL — ABNORMAL HIGH (ref 70–99)
Glucose-Capillary: 171 mg/dL — ABNORMAL HIGH (ref 70–99)
Glucose-Capillary: 96 mg/dL (ref 70–99)
Glucose-Capillary: 209 mg/dL — ABNORMAL HIGH (ref 70–99)

## 2014-02-07 LAB — BASIC METABOLIC PANEL
Anion gap: 12 (ref 5–15)
BUN: 31 mg/dL — ABNORMAL HIGH (ref 6–23)
CO2: 29 mEq/L (ref 19–32)
Calcium: 8.9 mg/dL (ref 8.4–10.5)
Chloride: 101 mEq/L (ref 96–112)
Creatinine, Ser: 1.92 mg/dL — ABNORMAL HIGH (ref 0.50–1.10)
GFR calc Af Amer: 29 mL/min — ABNORMAL LOW (ref >90)
GFR calc non Af Amer: 25 mL/min — ABNORMAL LOW (ref >90)
Glucose, Bld: 102 mg/dL — ABNORMAL HIGH (ref 70–99)
Potassium: 4.4 mEq/L (ref 3.7–5.3)
Sodium: 142 mEq/L (ref 137–147)

## 2014-02-07 MED ORDER — FUROSEMIDE 10 MG/ML IJ SOLN
80.0000 mg | Freq: Two times a day (BID) | INTRAMUSCULAR | Status: DC
  Administered 2014-02-07 – 2014-02-08 (×3): 80 mg via INTRAVENOUS
  Filled 2014-02-07 (×3): qty 8

## 2014-02-07 MED ORDER — CEFTRIAXONE SODIUM IN DEXTROSE 20 MG/ML IV SOLN
1.0000 g | INTRAVENOUS | Status: DC
  Administered 2014-02-07 – 2014-02-08 (×2): 1 g via INTRAVENOUS
  Filled 2014-02-07 (×2): qty 50

## 2014-02-07 MED ORDER — DIPHENHYDRAMINE HCL 25 MG PO CAPS
25.0000 mg | ORAL_CAPSULE | Freq: Four times a day (QID) | ORAL | Status: DC | PRN
  Administered 2014-02-07 – 2014-02-08 (×3): 25 mg via ORAL
  Filled 2014-02-07 (×3): qty 1

## 2014-02-07 NOTE — Progress Notes (Signed)
Preliminary results  Bilateral lower extremity venous duplex completed.  Bilateral lower extremities are negative for deep vein thrombosis. There is no evidence of Baker's cyst bilaterally.   02/07/2014 10:22 AM  Kandy Garrison, RVT, RDMS, RT(R)

## 2014-02-07 NOTE — Progress Notes (Signed)
Patient ID: Stephanie Woodward  female  [id number]    DOB: 1940/12/29    DOA: 02/05/2014  PCP: Donnie Coffin, MD  Patient is an 73 year old female with history of chronic systolic CHF, pulmonary hypertension, COPD, diabetes presented to ED with worsening swelling and pain in the right lower extremity. Patient reported pain and swelling over the last 4 days, denied any trauma. In ED patient was found to have erythema extending up to her knee with swelling worse on the right more than left. Patient was given Lasix, and placed on IV antibiotics.  Assessment/Plan: Principal Problem:  Cellulitis of right lower extremity -Changed to IV Rocephin for now, we'll transition to oral antibiotics at discharge - Doppler ultrasound negative for any DVT - Lasix increased to today, legs still tight with peripheral edema  Active Problems:   CHF (congestive heart failure): Chronic systolic - Recent cardiac catheter done in October 2015 showed decreased LV function, for now continue IV Lasix due to significant lower extremity edema - Increase Lasix today, monitor creatinine function    Essential hypertension -Currently stable, continue current medications   Diabetes mellitus type 2, uncontrolled - Continue sliding scale insulin, Levemir, tradjenta  Hyperlipidemia Continue Lipitor   DVT Prophylaxis: Lovenox  Code Status:Full code   Family Communication:  Disposition:  Consultants None  Procedures none  Antibiotics IV vancomycin discontinued 12/17 IV Rocephin  Subjective :   Patient seen and examined, feeling better however still has significant peripheral edema in both legs right more than left, no chest pain, nausea vomiting or shortness of breath   Objective : Weight change: 0 kg (0 lb)  Intake/Output Summary (Last 24 hours) at 02/07/14 1050 Last data filed at 02/07/14 0918  Gross per 24 hour  Intake   1120 ml  Output    750 ml  Net    370 ml   Blood pressure 109/68, pulse  56, temperature 98.4 F (36.9 C), temperature source Oral, resp. rate 18, height 5\' 2"  (1.575 m), weight 90.719 kg (200 lb), SpO2 95 %.  Physical Exam: General: Alert and awake, oriented x3, not in any acute distress. CVS: S1-S2 clear, no murmur rubs or gallops Chest: clear to auscultation bilaterally, no wheezing, rales or rhonchi Abdomen: soft nontender, nondistended, normal bowel sounds  Extremities: no cyanosis, clubbing, 2+ edema noted bilaterally, Right lower extremity erythema with chronic skin changes Neuro: Cranial nerves II-XII intact, no focal neurological deficits  Lab Results: Basic Metabolic Panel:  Recent Labs Lab 02/05/14 1735 02/07/14 0608  NA 143 142  K 4.2 4.4  CL 102 101  CO2 29 29  GLUCOSE 78 102*  BUN 21 31*  CREATININE 1.65* 1.92*  CALCIUM 9.7 8.9   Liver Function Tests:  Recent Labs Lab 02/05/14 1735  AST 21  ALT 23  ALKPHOS 118*  BILITOT 0.4  PROT 7.2  ALBUMIN 2.8*   No results for input(s): LIPASE, AMYLASE in the last 168 hours. No results for input(s): AMMONIA in the last 168 hours. CBC:  Recent Labs Lab 02/05/14 1735  WBC 10.5  NEUTROABS 7.7  HGB 12.0  HCT 38.3  MCV 79.0  PLT 266   Cardiac Enzymes:  Recent Labs Lab 02/05/14 2145  TROPONINI <0.30   BNP: Invalid input(s): POCBNP CBG:  Recent Labs Lab 02/06/14 0628 02/06/14 1106 02/06/14 1614 02/06/14 2143 02/07/14 0558  GLUCAP 115* 263* 143* 79 104*     Micro Results: No results found for this or any previous visit (from the past 240 hour(s)).  Studies/Results: Dg Chest 2 View  02/05/2014   CLINICAL DATA:  Initial valuation 4 shortness of breath.  EXAM: CHEST  2 VIEW  COMPARISON:  Prior radiograph from 01/11/2014  FINDINGS: Cardiomegaly is stable from prior study. Mediastinal silhouette within normal limits.  There is mild diffuse prominence of the interstitial markings, greatest within the lung bases. Findings suggestive of diffuse pulmonary vascular  congestion/mild interstitial edema. Linear opacity within the mid left lung is stable from prior, consistent with atelectasis/scar. A small left pleural effusion noted. No pneumothorax.  No acute osseus abnormality.  IMPRESSION: 1. Stable cardiomegaly with diffuse prominence of the interstitial markings, suggesting mild diffuse interstitial edema. 2. Small left pleural effusion.   Electronically Signed   By: Jeannine Boga M.D.   On: 02/05/2014 23:42   Dg Chest 2 View  01/11/2014   CLINICAL DATA:  Acute shortness of breath; 1 month history of severe cough; history of COPD and asthma as well as CHF, diabetes, and hypertension.  EXAM: CHEST  2 VIEW  COMPARISON:  PA and lateral chest of December 05, 2013  FINDINGS: The lungs are adequately inflated. The pulmonary interstitial markings remain increased. The pulmonary vascularity is engorged. The cardiopericardial silhouette is enlarged. There small bilateral pleural effusions not greatly changed from the previous study. The trachea is midline. The bony thorax is unremarkable.  IMPRESSION: Congestive heart failure with pulmonary interstitial edema and small bilateral pleural effusions. There has not been significant interval change since the earlier study.   Electronically Signed   By: David  Martinique   On: 01/11/2014 15:31    Medications: Scheduled Meds: . atorvastatin  80 mg Oral Daily  . cefTRIAXone (ROCEPHIN)  IV  1 g Intravenous Q24H  . enoxaparin (LOVENOX) injection  40 mg Subcutaneous Q24H  . furosemide  80 mg Intravenous BID  . gabapentin  100 mg Oral TID  . insulin aspart  0-9 Units Subcutaneous TID WC  . insulin detemir  50 Units Subcutaneous Daily  . latanoprost  1 drop Both Eyes QHS  . linagliptin  5 mg Oral Daily  . metoprolol succinate  25 mg Oral BID  . mometasone-formoterol  2 puff Inhalation BID  . pantoprazole  40 mg Oral Daily  . sodium chloride  3 mL Intravenous Q12H  . tiotropium  18 mcg Inhalation Daily  . traMADol  50 mg  Oral BID      LOS: 2 days   Vibha Ferdig M.D. [facility] 02/07/2014, 10:50 AM Pager: [phone]  If 7PM-7AM, please contact night-coverage [email or web address] Password [email or web address]

## 2014-02-08 LAB — BASIC METABOLIC PANEL
Anion gap: 12 (ref 5–15)
BUN: 33 mg/dL — ABNORMAL HIGH (ref 6–23)
CO2: 27 mEq/L (ref 19–32)
Calcium: 8.7 mg/dL (ref 8.4–10.5)
Chloride: 102 mEq/L (ref 96–112)
Creatinine, Ser: 1.95 mg/dL — ABNORMAL HIGH (ref 0.50–1.10)
GFR calc Af Amer: 29 mL/min — ABNORMAL LOW (ref >90)
GFR calc non Af Amer: 25 mL/min — ABNORMAL LOW (ref >90)
Glucose, Bld: 177 mg/dL — ABNORMAL HIGH (ref 70–99)
Potassium: 3.8 mEq/L (ref 3.7–5.3)
Sodium: 141 mEq/L (ref 137–147)

## 2014-02-08 LAB — GLUCOSE, CAPILLARY
Glucose-Capillary: 124 mg/dL — ABNORMAL HIGH (ref 70–99)
Glucose-Capillary: 214 mg/dL — ABNORMAL HIGH (ref 70–99)

## 2014-02-08 MED ORDER — DIPHENHYDRAMINE HCL 25 MG PO CAPS: 25.0000 mg | ORAL_CAPSULE | Freq: Four times a day (QID) | ORAL | Status: DC | PRN

## 2014-02-08 MED ORDER — CEPHALEXIN 500 MG PO CAPS
500.0000 mg | ORAL_CAPSULE | Freq: Two times a day (BID) | ORAL | Status: DC
  Filled 2014-02-08 (×2): qty 1

## 2014-02-08 MED ORDER — DIPHENHYDRAMINE-ZINC ACETATE 1-0.1 % EX CREA
TOPICAL_CREAM | Freq: Every day | CUTANEOUS | Status: DC | PRN
Start: 2014-02-08 — End: 2016-03-11

## 2014-02-08 MED ORDER — CEPHALEXIN 500 MG PO CAPS: 500.0000 mg | ORAL_CAPSULE | Freq: Two times a day (BID) | ORAL | Status: DC

## 2014-02-08 MED ORDER — FLUCONAZOLE 150 MG PO TABS
150.0000 mg | ORAL_TABLET | Freq: Once | ORAL | Status: AC
  Administered 2014-02-08: 150 mg via ORAL
  Filled 2014-02-08: qty 1

## 2014-02-08 MED ORDER — CEPHALEXIN 500 MG PO CAPS
500.0000 mg | ORAL_CAPSULE | Freq: Three times a day (TID) | ORAL | Status: DC
  Filled 2014-02-08 (×4): qty 1

## 2014-02-08 MED ORDER — FLUCONAZOLE 150 MG PO TABS: 150.0000 mg | ORAL_TABLET | ORAL | Status: DC | PRN

## 2014-02-08 NOTE — Discharge Summary (Signed)
Physician Discharge Summary  Patient ID: Stephanie Woodward MRN: [id number] DOB/AGE: Jun 06, 1940 74 y.o.  Admit date: 02/05/2014 Discharge date: 02/08/2014  Primary Care Physician:  Donnie Coffin, MD  Discharge Diagnoses:    . Cellulitis of right lower extremity . Essential hypertension . Hyperlipidemia Mild acute on chronic systolic CHF exacerbation  Consults: None   Recommendations for Outpatient Follow-up:  Patient was recommended to follow up with cardiology in next 10-14 days, adjust Lasix dose accordingly  TESTS THAT NEED FOLLOW-UP BMET at the time of follow-up   DIET: Carb modified diet, low-sodium    Allergies:   Allergies  Allergen Reactions  . Eggs Or Egg-Derived Products Nausea And Vomiting  . Lisinopril   . Penicillins Nausea And Vomiting     Discharge Medications:   Medication List    STOP taking these medications        amLODipine 10 MG tablet  Commonly known as:  NORVASC      TAKE these medications        albuterol 108 (90 BASE) MCG/ACT inhaler  Commonly known as:  PROVENTIL HFA;VENTOLIN HFA  Inhale 2 puffs into the lungs every 4 (four) hours as needed for shortness of breath.     atorvastatin 80 MG tablet  Commonly known as:  LIPITOR  Take 80 mg by mouth daily.     cephALEXin 500 MG capsule  Commonly known as:  KEFLEX  Take 1 capsule (500 mg total) by mouth 2 (two) times daily. X 10days     diphenhydrAMINE 25 mg capsule  Commonly known as:  BENADRYL  Take 1 capsule (25 mg total) by mouth every 6 (six) hours as needed for itching.     diphenhydrAMINE-zinc acetate cream  Commonly known as:  BENADRYL  Apply topically daily as needed for itching. To legs     fluconazole 150 MG tablet  Commonly known as:  DIFLUCAN  Take 1 tablet (150 mg total) by mouth every three (3) days as needed (yeast infection).     Fluticasone-Salmeterol 250-50 MCG/DOSE Aepb  Commonly known as:  ADVAIR  Inhale 1 puff into the lungs 2 (two) times daily.      furosemide 40 MG tablet  Commonly known as:  LASIX  Take 1 tablet (40 mg total) by mouth 2 (two) times daily.     gabapentin 100 MG capsule  Commonly known as:  NEURONTIN  Take 100 mg by mouth 3 (three) times daily.     insulin detemir 100 UNIT/ML injection  Commonly known as:  LEVEMIR  Inject 0.5 mLs (50 Units total) into the skin daily.     latanoprost 0.005 % ophthalmic solution  Commonly known as:  XALATAN  Place 1 drop into both eyes at bedtime.     metoprolol succinate 25 MG 24 hr tablet  Commonly known as:  TOPROL-XL  Take 25 mg by mouth 2 (two) times daily.     pantoprazole 40 MG tablet  Commonly known as:  PROTONIX  Take 40 mg by mouth daily.     promethazine 25 MG tablet  Commonly known as:  PHENERGAN  Take 25 mg by mouth every 6 (six) hours as needed for nausea or vomiting.     saxagliptin HCl 5 MG Tabs tablet  Commonly known as:  ONGLYZA  Take 5 mg by mouth daily.     tiotropium 18 MCG inhalation capsule  Commonly known as:  SPIRIVA  Place 18 mcg into inhaler and inhale daily.     traMADol 50 MG tablet  Commonly known as:  ULTRAM  Take 50 mg by mouth 2 (two) times daily.     zolpidem 10 MG tablet  Commonly known as:  AMBIEN  Take 10 mg by mouth at bedtime as needed for sleep.         Brief H and P: For complete details please refer to admission H and P, but in brief Stephanie Woodward is a 73 y.o. female with history of CHF, systolic, pulmonary hypertension, COPD, diabetes mellitus2 presents to the ER because of worsening swelling and pain in the right lower extremity. Patient stated that patient has benign pain and swelling over the last 4 days. Denies any trauma. Denies any fever or chills. In the ER patient on exam was found to have erythema extending up to her knee with swelling worse on the right side more than left thigh. Patient denied any shortness of breath and denied having gained any weight from her baseline. Patient has been given Lasix 40 mg IV 1  dose and empiric antibiotics for cellulitis and admitted for further management. Patient denied any chest pain or productive cough nausea vomiting abdominal pain or diarrhea.   Hospital Course:   Cellulitis of right lower extremity: Significantly improving Patient was initially placed on IV vancomycin, less concern for MRSA, hence changed to IV Rocephin. Patient tolerated antibiotics well with exception of yeast vaginitis. She was transitioned to oral Keflex at discharge for another 10 days. Doppler ultrasound was negative for any DVT. Patient was also placed on IV diuresis with Lasix to assist with peripheral edema.   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 Lasix was increased to 80 mg IV twice a day, creatinine slightly trended up to 1.9, baseline 1.6- 1.7. Patient will continue home dose of Lasix at home.   Essential hypertension -Currently stable, continue current medications  Diabetes mellitus type 2, uncontrolled Continue Levemir, tradjenta  Hyperlipidemia Continue Lipitor  Yeast vaginitis Patient was given Diflucan 150 mg 1 during hospitalization, prescription for Diflucan  Day of Discharge BP 123/46 mmHg  Pulse 75  Temp(Src) 98.7 F (37.1 C) (Oral)  Resp 18  Ht 5\' 2"  (1.575 m)  Wt 90.2 kg (198 lb 13.7 oz)  BMI 36.36 kg/m2  SpO2 98%  Physical Exam: General: Alert and awake oriented x3 not in any acute distress. CVS: S1-S2 clear no murmur rubs or gallops Chest: clear to auscultation bilaterally, no wheezing rales or rhonchi Abdomen: soft nontender, nondistended, normal bowel sounds Extremities: no cyanosis, clubbing , right-sided erythema and edema improving  Neuro: Cranial nerves II-XII intact, no focal neurological deficits   The results of significant diagnostics from this hospitalization  (including imaging, microbiology, ancillary and laboratory) are listed below for reference.    LAB RESULTS: Basic Metabolic Panel:  Recent Labs Lab 02/07/14 0608 02/08/14 0342  NA 142 141  K 4.4 3.8  CL 101 102  CO2 29 27  GLUCOSE 102* 177*  BUN 31* 33*  CREATININE 1.92* 1.95*  CALCIUM 8.9 8.7   Liver Function Tests:  Recent Labs Lab 02/05/14 1735  AST 21  ALT 23  ALKPHOS 118*  BILITOT 0.4  PROT 7.2  ALBUMIN 2.8*   No results for input(s): LIPASE, AMYLASE in the last 168 hours. No results for input(s): AMMONIA in the last 168 hours. CBC:  Recent Labs Lab 02/05/14 1735  WBC 10.5  NEUTROABS 7.7  HGB 12.0  HCT 38.3  MCV 79.0  PLT 266   Cardiac Enzymes:  Recent Labs Lab 02/05/14 2145  TROPONINI <0.30   BNP: Invalid input(s): POCBNP CBG:  Recent Labs Lab 02/07/14 1656 02/07/14 2056  GLUCAP 96 209*    Significant Diagnostic Studies:  Dg Chest 2 View  02/05/2014   CLINICAL DATA:  Initial valuation 4 shortness of breath.  EXAM: CHEST  2 VIEW  COMPARISON:  Prior radiograph from 01/11/2014  FINDINGS: Cardiomegaly is stable from prior study. Mediastinal silhouette within normal limits.  There is mild diffuse prominence of the interstitial markings, greatest within the lung bases. Findings suggestive of diffuse pulmonary vascular congestion/mild interstitial edema. Linear opacity within the mid left lung is stable from prior, consistent with atelectasis/scar. A small left pleural effusion noted. No pneumothorax.  No acute osseus abnormality.  IMPRESSION: 1. Stable cardiomegaly with diffuse prominence of the interstitial markings, suggesting mild diffuse interstitial edema. 2. Small left pleural effusion.   Electronically Signed   By: Jeannine Boga M.D.   On: 02/05/2014 23:42      Disposition and Follow-up:     Discharge Instructions    (HEART FAILURE PATIENTS) Call MD:  Anytime you have any of the following symptoms: 1) 3 pound weight gain in 24  hours or 5 pounds in 1 week 2) shortness of breath, with or without a dry hacking cough 3) swelling in the hands, feet or stomach 4) if you have to sleep on extra pillows at night in order to breathe.    Complete by:  As directed      Diet Carb Modified    Complete by:  As directed      Increase activity slowly    Complete by:  As directed             DISPOSITION: Home   DISCHARGE FOLLOW-UP Follow-up Information    Follow up with [facility].   Why:  Registered Nurse and Physical Therapy Services to resume with [facility] information:   [place] [phone]       Follow up with Donnie Coffin, MD. Schedule an appointment as soon as possible for a visit in 10 days.   Specialty:  Family Medicine   Why:  for hospital follow-up   Contact information:   [address] [place] [address] [phone]       Follow up with Kerry Hough, MD. Schedule an appointment as soon as possible for a visit in 2 weeks.   Specialty:  Cardiology   Why:  for hospital follow-up   Contact information:   [address] [place] [address] [phone]        Time spent on Discharge: 39 minutes  Signed:   Elize Pinon M.D. [facility] 02/08/2014, 10:40 AM Pager: [phone]

## 2014-02-08 NOTE — Progress Notes (Signed)
DC IV, DC Tele, DC Home. Discharge instructions and home medications discussed with patient. Patient denied any questions or concerns at this time. Patient leaving unit via wheelchair. Patient leaving unit via wheelchair and appear in no acute distress.

## 2014-02-22 ENCOUNTER — Ambulatory Visit (HOSPITAL_BASED_OUTPATIENT_CLINIC_OR_DEPARTMENT_OTHER): Payer: Medicare Other | Attending: Pulmonary Disease | Admitting: *Deleted

## 2014-02-22 VITALS — Ht 62.0 in | Wt 193.0 lb

## 2014-02-22 DIAGNOSIS — R0602 Shortness of breath: Secondary | ICD-10-CM | POA: Insufficient documentation

## 2014-02-22 DIAGNOSIS — G4733 Obstructive sleep apnea (adult) (pediatric): Secondary | ICD-10-CM | POA: Diagnosis not present

## 2014-02-28 DIAGNOSIS — G4733 Obstructive sleep apnea (adult) (pediatric): Secondary | ICD-10-CM

## 2014-02-28 NOTE — Sleep Study (Signed)
   NAME: Stephanie Woodward DATE OF BIRTH:  November 18, 1940 MEDICAL RECORD NUMBER [id number]  LOCATION: [facility]  PHYSICIAN: [place] OF STUDY: 02/22/2014  SLEEP STUDY TYPE: Nocturnal Polysomnogram               REFERRING PHYSICIAN: Clance, Armando Reichert, MD  INDICATION FOR STUDY: Hypersomnia with sleep apnea  EPWORTH SLEEPINESS SCORE:  3 HEIGHT: 5\' 2"  (157.5 cm)  WEIGHT: 193 lb (87.544 kg)    Body mass index is 35.29 kg/(m^2).  NECK SIZE: 16 in.  MEDICATIONS: Reviewed in the sleep record  SLEEP ARCHITECTURE: The patient had a total sleep time of 342 minutes, with very little slow-wave sleep and only 29 minutes of REM. LEEP onset latency was normal at 12 minutes, and REM onset was normal at 37 minutes. Sleep efficiency was mildly reduced.  RESPIRATORY DATA: The patient was found to have 5 apneas and 22 obstructive hypopneas, giving her an AHI of only 5 events per hour. The events occurred primarily in the supine position, and there was mild snoring noted throughout.  OXYGEN DATA: The patient had oxygen desaturation transiently as low as 85% with her obstructive events, however she only spent 3 minutes the entire night less than 88%.  CARDIAC DATA: The patient was noted to have what appears to be a paced rhythm throughout the night  MOVEMENT/PARASOMNIA: No significant periodic limb movements or abnormal behaviors were noted.  IMPRESSION/ RECOMMENDATION:    1) minimal obstructive sleep apnea/hypopnea syndrome, with an AHI of only 5 events per hour and oxygen desaturation transiently as low as 85%. Treatment for this can include a trial of weight loss alone, upper airway surgery, dental appliance, or C Pap. Given the minimal nature of her sleep disordered breathing, this represents very little impact to her cardiovascular health. The decision to treat this degree of sleep apnea should purely be based on its impact to her quality of life.       [place], American Board of Sleep Medicine  ELECTRONICALLY SIGNED ON:  02/28/2014, 1:26 PM [place] PH: [phone]   FX: [phone] [place]

## 2014-02-28 NOTE — Progress Notes (Signed)
appt scheduled to see [facility] 02/28/14

## 2014-02-28 NOTE — Progress Notes (Signed)
lmomtcb x1 

## 2014-02-28 NOTE — Progress Notes (Signed)
Pt needs ov to review sleep test results.

## 2014-03-01 ENCOUNTER — Encounter: Payer: Self-pay | Admitting: Pulmonary Disease

## 2014-03-01 ENCOUNTER — Ambulatory Visit (INDEPENDENT_AMBULATORY_CARE_PROVIDER_SITE_OTHER): Payer: Medicare Other | Admitting: Pulmonary Disease

## 2014-03-01 DIAGNOSIS — G4733 Obstructive sleep apnea (adult) (pediatric): Secondary | ICD-10-CM

## 2014-03-01 NOTE — Patient Instructions (Signed)
Will start you on cpap for the next 8 weeks to see if it can improve your sleep.  Please call if having issues with tolerance Work on weight loss followup with me again in 8 weeks.

## 2014-03-01 NOTE — Progress Notes (Signed)
   Subjective:    Patient ID: Stephanie Woodward, female    DOB: May 09, 1940, 74 y.o.   MRN: [id number]  HPI The patient comes in today for follow-up of her recent sleep study. He was found to have minimal obstructive sleep apnea, with an AHI of 5 events per hour. She had transient desaturation only as low as 85%, and only spent 3 minutes the entire night less than 88%. I have reviewed the study with her in detail, and answered all of her questions.   Review of Systems  Constitutional: Negative for fever and unexpected weight change.  HENT: Negative for congestion, dental problem, ear pain, nosebleeds, postnasal drip, rhinorrhea, sinus pressure, sneezing, sore throat and trouble swallowing.   Eyes: Negative for redness and itching.  Respiratory: Positive for shortness of breath and wheezing. Negative for cough and chest tightness.   Cardiovascular: Positive for leg swelling. Negative for palpitations.  Gastrointestinal: Negative for nausea and vomiting.  Genitourinary: Negative for dysuria.  Musculoskeletal: Negative for joint swelling.  Skin: Negative for rash.  Neurological: Negative for headaches.  Hematological: Does not bruise/bleed easily.  Psychiatric/Behavioral: Negative for dysphoric mood. The patient is not nervous/anxious.        Objective:   Physical Exam Obese female in no acute distress Nose without purulence or discharge noted Neck without lymphadenopathy or thyromegaly Lower extremities with edema noted, no cyanosis Alert and oriented, moves all 4 extremities.       Assessment & Plan:

## 2014-03-01 NOTE — Assessment & Plan Note (Signed)
The patient has minimal obstructive sleep apnea by her recent sleep study, and it is unclear if her symptoms during the night and day had anything to do with her sleep disordered breathing. Given the minimal nature of her sleep apnea, I do not think this is a significant risk factor for her underlying cardiovascular health. The decision to treat this, will depend upon its impact to her quality of life. Long discussion with she and her family member, the patient has decided to try C Pap. I will set the patient up on cpap at a moderate pressure level to allow for desensitization, and will troubleshoot the device over the next 4-6weeks if needed.  The pt is to call me if having issues with tolerance.  Will then optimize the pressure once patient is able to wear cpap on a consistent basis.

## 2014-03-29 ENCOUNTER — Ambulatory Visit (INDEPENDENT_AMBULATORY_CARE_PROVIDER_SITE_OTHER): Payer: Medicare Other | Admitting: Pulmonary Disease

## 2014-03-29 ENCOUNTER — Encounter (INDEPENDENT_AMBULATORY_CARE_PROVIDER_SITE_OTHER): Payer: Self-pay

## 2014-03-29 DIAGNOSIS — R0602 Shortness of breath: Secondary | ICD-10-CM

## 2014-03-29 LAB — PULMONARY FUNCTION TEST
DL/VA % pred: 105 %
DL/VA: 4.81 ml/min/mmHg/L
DLCO unc % pred: 41 %
DLCO unc: 8.85 ml/min/mmHg
FEF 25-75 Post: 0.72 L/sec
FEF 25-75 Pre: 0.47 L/sec
FEF2575-%Change-Post: 53 %
FEF2575-%Pred-Post: 47 %
FEF2575-%Pred-Pre: 31 %
FEV1-%Change-Post: 11 %
FEV1-%Pred-Post: 41 %
FEV1-%Pred-Pre: 37 %
FEV1-Post: 0.68 L
FEV1-Pre: 0.61 L
FEV1FVC-%Change-Post: 14 %
FEV1FVC-%Pred-Pre: 98 %
FEV6-%Change-Post: -1 %
FEV6-%Pred-Post: 38 %
FEV6-%Pred-Pre: 39 %
FEV6-Post: 0.79 L
FEV6-Pre: 0.79 L
FEV6FVC-%Pred-Post: 104 %
FEV6FVC-%Pred-Pre: 104 %
FVC-%Change-Post: -2 %
FVC-%Pred-Post: 37 %
FVC-%Pred-Pre: 38 %
FVC-Post: 0.79 L
FVC-Pre: 0.8 L
Post FEV1/FVC ratio: 86 %
Post FEV6/FVC ratio: 100 %
Pre FEV1/FVC ratio: 76 %
Pre FEV6/FVC Ratio: 100 %

## 2014-03-29 NOTE — Progress Notes (Signed)
PFT done today. 

## 2014-04-01 ENCOUNTER — Ambulatory Visit: Payer: Medicare Other | Admitting: Pulmonary Disease

## 2014-04-10 ENCOUNTER — Encounter: Payer: Self-pay | Admitting: Pulmonary Disease

## 2014-04-10 ENCOUNTER — Ambulatory Visit (INDEPENDENT_AMBULATORY_CARE_PROVIDER_SITE_OTHER): Payer: Medicare Other | Admitting: Pulmonary Disease

## 2014-04-10 VITALS — BP 122/66 | HR 89 | Temp 97.6°F | Ht 62.0 in | Wt 198.8 lb

## 2014-04-10 DIAGNOSIS — R0602 Shortness of breath: Secondary | ICD-10-CM

## 2014-04-10 NOTE — Assessment & Plan Note (Signed)
The patient has very little COPD by her PFTs, and this is primarily manifested as air trapping. Her major issue is her centripetal obesity which is causing restriction on her lung volumes, and her ongoing issues with chronic congestive heart failure. I am okay with her continuing on some type of bronchodilator regimen, but her current med combination is probably overkill. She could either try using Spiriva alone, or Advair alone and do as well. I have asked her to work on aggressive weight loss and some type of conditioning program. As far as her cough, this is primarily dry, and is being caused by a tickle in her throat. Denies any issues with reflux, but does have some postnasal drip. It should be noted that she does not have an ACE inhibitor on her list

## 2014-04-10 NOTE — Progress Notes (Signed)
   Subjective:    Patient ID: Stephanie Woodward, female    DOB: 1941/02/06, 74 y.o.   MRN: [id number]  HPI The patient comes in today for an acute sick visit. She is morbidly obese, extremely deconditioned, and has known chronic congestive heart failure. She has had recent pulmonary function studies that show no obstruction by spirometry, but does have air-trapping on her lung volumes. She has a total lung capacity that is 40% of predicted and most likely secondary to her centripetal obesity. She also has a severe reduction in her DLCO secondary to her obesity and underlying heart disease. She comes in today with persistent dyspnea on exertion, despite being continued on a very aggressive bronchodilator regimen. As a mild cough but is clearly upper airway in origin, with constant throat clearing during our visit today. She denies any reflux symptoms, but does have some postnasal drip.   Review of Systems  Constitutional: Negative for fever and unexpected weight change.  HENT: Negative for congestion, dental problem, ear pain, nosebleeds, postnasal drip, rhinorrhea, sinus pressure, sneezing, sore throat and trouble swallowing.   Eyes: Negative for redness and itching.  Respiratory: Positive for cough, shortness of breath and wheezing. Negative for chest tightness.   Cardiovascular: Negative for palpitations and leg swelling.  Gastrointestinal: Negative for nausea and vomiting.  Genitourinary: Negative for dysuria.  Musculoskeletal: Negative for joint swelling.  Skin: Negative for rash.  Neurological: Negative for headaches.  Hematological: Does not bruise/bleed easily.  Psychiatric/Behavioral: Negative for dysphoric mood. The patient is not nervous/anxious.        Objective:   Physical Exam Morbidly obese female in no acute distress Nose without purulence or discharge noted Neck without lymphadenopathy or thyromegaly Chest totally clear to auscultation, no wheezes or crackles Cardiac exam  with regular rate and rhythm Extremities with edema noted, no cyanosis Alert and oriented, moves all 4 extremities.       Assessment & Plan:

## 2014-04-10 NOTE — Patient Instructions (Signed)
Your breathing studies show minimal copd.  I do not think this is the cause of your shortness of breath.  I think your weight, conditioning, and chronic heart failure are the issues Try taking zyrtec 10mg  one a day at bedtime to see if this will help with the tickle in your throat and cough. Work on weight loss and some type of activity program Will send a note to your primary doctor suggesting that one of your inhalers be discontinued. followup with me as needed.

## 2014-05-09 ENCOUNTER — Ambulatory Visit: Payer: Medicare Other | Admitting: Pulmonary Disease

## 2014-05-30 ENCOUNTER — Ambulatory Visit (INDEPENDENT_AMBULATORY_CARE_PROVIDER_SITE_OTHER): Payer: Medicare Other | Admitting: Pulmonary Disease

## 2014-05-30 ENCOUNTER — Encounter: Payer: Self-pay | Admitting: Pulmonary Disease

## 2014-05-30 VITALS — BP 130/70 | HR 86 | Temp 97.0°F | Ht 62.0 in | Wt 194.8 lb

## 2014-05-30 DIAGNOSIS — G4733 Obstructive sleep apnea (adult) (pediatric): Secondary | ICD-10-CM | POA: Diagnosis not present

## 2014-05-30 NOTE — Progress Notes (Signed)
   Subjective:    Patient ID: Stephanie Woodward, female    DOB: 1940-03-16, 74 y.o.   MRN: [id number]  HPI The patient comes in today for follow-up of her obstructive sleep apnea. Even nose she has mild disease, she has had a significant response to see Pap with improved sleep and daytime alertness. Her download shows excellent control of her AHI and no significant mask leaks when she wears the device. She is having difficulties with compliance because of an odor that has developed within her C Pap circuit. She has tried cleaning aggressively with white vinegar, but it continues to be an issue.   Review of Systems  Constitutional: Negative for fever and unexpected weight change.  HENT: Negative for congestion, dental problem, ear pain, nosebleeds, postnasal drip, rhinorrhea, sinus pressure, sneezing, sore throat and trouble swallowing.   Eyes: Negative for redness and itching.  Respiratory: Positive for shortness of breath and wheezing. Negative for cough and chest tightness.   Cardiovascular: Negative for palpitations and leg swelling.  Gastrointestinal: Negative for nausea and vomiting.  Genitourinary: Negative for dysuria.  Musculoskeletal: Negative for joint swelling.  Skin: Negative for rash.  Neurological: Negative for headaches.  Hematological: Does not bruise/bleed easily.  Psychiatric/Behavioral: Negative for dysphoric mood. The patient is not nervous/anxious.        Objective:   Physical Exam Mildly overweight female in no acute distress Nose without purulence or discharge noted No skin breakdown or pressure necrosis from the C Pap mask Neck without lymphadenopathy or thyromegaly Lower extremities with mild edema, no cyanosis Alert and oriented, moves all 4 extremities.       Assessment & Plan:

## 2014-05-30 NOTE — Assessment & Plan Note (Signed)
The patient has minimal obstructive sleep apnea, but we gave her a C Pap trial because of ongoing symptoms. She has seen a significant difference in her sleep and energy level since being on her device. She is now having issues with compliance because of an odor that has developed in her machine. He has tried cleaning it aggressively, including white failure, but continues to have the odor. Will have her home care company check her device, and I've encouraged her to continue working on weight loss.

## 2014-05-30 NOTE — Patient Instructions (Signed)
Continue on cpap Will get [employer] to check your machine to find out about the odor. Work on weight reduction. followup in 35mos.

## 2015-01-20 ENCOUNTER — Encounter: Payer: Medicare Other | Attending: Family Medicine | Admitting: Dietician

## 2015-01-20 ENCOUNTER — Encounter: Payer: Self-pay | Admitting: Dietician

## 2015-01-20 VITALS — Ht 62.0 in | Wt 200.0 lb

## 2015-01-20 DIAGNOSIS — N183 Chronic kidney disease, stage 3 (moderate): Secondary | ICD-10-CM | POA: Insufficient documentation

## 2015-01-20 DIAGNOSIS — Z794 Long term (current) use of insulin: Secondary | ICD-10-CM | POA: Diagnosis not present

## 2015-01-20 DIAGNOSIS — Z713 Dietary counseling and surveillance: Secondary | ICD-10-CM | POA: Diagnosis not present

## 2015-01-20 DIAGNOSIS — E1122 Type 2 diabetes mellitus with diabetic chronic kidney disease: Secondary | ICD-10-CM | POA: Diagnosis present

## 2015-01-20 NOTE — Patient Instructions (Signed)
Plan:  Aim for 2-3 Carb Choices per meal (30-45 grams) +/- 1 either way  Aim for 0-1 Carbs per snack if hungry  Include protein in moderation with your meals and snacks Consider reading food labels for Total Carbohydrate and Fat Grams of foods Consider  increasing your activity level by light walking or armchair exercise or [employer] silver sneakers for 15 minutes daily as tolerated Consider checking BG at alternate times per day as directed by MD  Consider taking medication as directed by MD

## 2015-01-20 NOTE — Progress Notes (Signed)
Diabetes Self-Management Education  Visit Type: First/Initial  Appt. Start Time: 0915 Appt. End Time: 1030  01/20/2015  Stephanie Woodward, identified by name and date of birth, is a 74 y.o. female with a diagnosis of Diabetes: Type 2.   Patient is here alone.  She lives alone and does her own shopping and cooking.  She follows a low sodium, low calorie, carbohydrate modified diet.  She is allergic to eggs (but can tolerate small amounts in cooking).  She does not eat rice, drink milk or eat .  She eats very little pasta.  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  Medications include Levemir and Onglyza.  She was on Victoza for a couple years but this stopped working per patient.  She reports concern of Stage 3 kidney disease and Metformin was stopped.  ASSESSMENT  Height 5\' 2"  (1.575 m), weight 200 lb (90.719 kg). Body mass index is 36.57 kg/(m^2).      Diabetes Self-Management Education - 01/20/15 0924    Visit Information   Visit Type First/Initial   Initial Visit   Diabetes Type Type 2   Are you currently following a meal plan? Yes   What type of meal plan do you follow? Low sodium, Low calorie   Are you taking your medications as prescribed? Yes   Date Diagnosed 15 years ago   Health Coping   How would you rate your overall health? Very Poor  "only 1 kidney with stage 3 CKD, HTN, fluid removed from around heart, back problems causing severe pain"   Psychosocial Assessment   Patient Belief/Attitude about Diabetes Motivated to manage diabetes   Self-care barriers None   Self-management support Doctor's office   Other persons present Patient   Patient Concerns Glycemic Control;Other (comment)  carbohydrate counting   Special Needs None   Preferred Learning Style No preference indicated   Learning Readiness Ready   How often do you need to have someone help you when you read instructions, pamphlets, or other written materials from your  doctor or pharmacy? 1 - Never   What is the last grade level you completed in school? [id number] grade   Complications   Last HgB A1C per patient/outside source 8 %  3 months ago (highest it has been)   How often do you check your blood sugar? 3-4 times/day   Fasting Blood glucose range (mg/dL) 70-129  highest 153 recenly   Postprandial Blood glucose range (mg/dL) >200   Number of hypoglycemic episodes per month 0   Number of hyperglycemic episodes per week 5   Can you tell when your blood sugar is high? Yes   What do you do if your blood sugar is high? drink water and exercise   Have you had a dilated eye exam in the past 12 months? Yes   Have you had a dental exam in the past 12 months? No  no teeth   Are you checking your feet? Yes   How many days per week are you checking your feet? 5   Dietary Intake   Breakfast unsweetened oatmeal, [place] bacon or sausage, fruit (10-12)   Snack (morning) 100 calorie cookie package (17 g CHO)   Lunch soup (homeade with LS chicken broth), salad or sandwich, fruit   Snack (afternoon) apple   Dinner nonstarchy vegetable, turkey/chicken/fish sticks, 1-2 slices [employer] bread, fruit   Snack (evening) 100 calorie cookie package (17 g CHO) or sugar free jello  or sherbet   Beverage(s) water, unsweetened tea with splenda or lemon, coffee with splenda   Exercise   Exercise Type ADL's;Light (walking / raking leaves);Other (comment)  armchair exercises   How many days per week to you exercise? 4   How many minutes per day do you exercise? 10   Total minutes per week of exercise 40   Patient Education   Previous Diabetes Education No   Disease state  Definition of diabetes, type 1 and 2, and the diagnosis of diabetes;Explored patient's options for treatment of their diabetes   Nutrition management  Role of diet in the treatment of diabetes and the relationship between the three main macronutrients and blood glucose level;Food label reading, portion sizes and measuring  food.;Carbohydrate counting;Meal options for control of blood glucose level and chronic complications.   Physical activity and exercise  Role of exercise on diabetes management, blood pressure control and cardiac health.;Helped patient identify appropriate exercises in relation to his/her diabetes, diabetes complications and other health issue.   Monitoring Yearly dilated eye exam;Daily foot exams;Identified appropriate SMBG and/or A1C goals.   Acute complications Taught treatment of hypoglycemia - the 15 rule.   Chronic complications Relationship between chronic complications and blood glucose control   Individualized Goals (developed by patient)   Nutrition General guidelines for healthy choices and portions discussed;Follow meal plan discussed   Physical Activity Exercise 5-7 days per week;15 minutes per day   Medications take my medication as prescribed   Monitoring  test my blood glucose as discussed   Reducing Risk examine blood glucose patterns   Outcomes   Expected Outcomes Demonstrated interest in learning. Expect positive outcomes   Future DMSE PRN   Program Status Completed      Individualized Plan for Diabetes Self-Management Training:   Learning Objective:  Patient will have a greater understanding of diabetes self-management. Patient education plan is to attend individual and/or group sessions per assessed needs and concerns.   Plan:   Patient Instructions  Plan:  Aim for 2-3 Carb Choices per meal (30-45 grams) +/- 1 either way  Aim for 0-1 Carbs per snack if hungry  Include protein in moderation with your meals and snacks Consider reading food labels for Total Carbohydrate and Fat Grams of foods Consider  increasing your activity level by light walking or armchair exercise or [employer] silver sneakers for 15 minutes daily as tolerated Consider checking BG at alternate times per day as directed by MD  Consider taking medication as directed by MD    Expected Outcomes:   Demonstrated interest in learning. Expect positive outcomes  Education material provided: Living Well with Diabetes, Meal plan card, My Plate and Snack sheet  If problems or questions, patient to contact team via:  Phone  Future DSME appointment: PRN

## 2015-03-18 DIAGNOSIS — G8929 Other chronic pain: Secondary | ICD-10-CM | POA: Diagnosis not present

## 2015-03-18 DIAGNOSIS — E875 Hyperkalemia: Secondary | ICD-10-CM | POA: Diagnosis not present

## 2015-03-18 DIAGNOSIS — I10 Essential (primary) hypertension: Secondary | ICD-10-CM | POA: Diagnosis not present

## 2015-03-18 DIAGNOSIS — R0600 Dyspnea, unspecified: Secondary | ICD-10-CM | POA: Diagnosis not present

## 2015-03-18 DIAGNOSIS — N183 Chronic kidney disease, stage 3 (moderate): Secondary | ICD-10-CM | POA: Diagnosis not present

## 2015-03-18 DIAGNOSIS — N179 Acute kidney failure, unspecified: Secondary | ICD-10-CM | POA: Diagnosis not present

## 2015-04-24 DIAGNOSIS — Z7984 Long term (current) use of oral hypoglycemic drugs: Secondary | ICD-10-CM | POA: Diagnosis not present

## 2015-04-24 DIAGNOSIS — J449 Chronic obstructive pulmonary disease, unspecified: Secondary | ICD-10-CM | POA: Diagnosis not present

## 2015-04-24 DIAGNOSIS — E7800 Pure hypercholesterolemia, unspecified: Secondary | ICD-10-CM | POA: Diagnosis not present

## 2015-04-24 DIAGNOSIS — M545 Low back pain: Secondary | ICD-10-CM | POA: Diagnosis not present

## 2015-04-24 DIAGNOSIS — I10 Essential (primary) hypertension: Secondary | ICD-10-CM | POA: Diagnosis not present

## 2015-04-24 DIAGNOSIS — E1121 Type 2 diabetes mellitus with diabetic nephropathy: Secondary | ICD-10-CM | POA: Diagnosis not present

## 2015-04-24 DIAGNOSIS — K219 Gastro-esophageal reflux disease without esophagitis: Secondary | ICD-10-CM | POA: Diagnosis not present

## 2015-04-24 DIAGNOSIS — M179 Osteoarthritis of knee, unspecified: Secondary | ICD-10-CM | POA: Diagnosis not present

## 2015-04-24 DIAGNOSIS — Z794 Long term (current) use of insulin: Secondary | ICD-10-CM | POA: Diagnosis not present

## 2015-04-28 DIAGNOSIS — N183 Chronic kidney disease, stage 3 (moderate): Secondary | ICD-10-CM | POA: Diagnosis not present

## 2015-04-28 DIAGNOSIS — I5032 Chronic diastolic (congestive) heart failure: Secondary | ICD-10-CM | POA: Diagnosis not present

## 2015-04-28 DIAGNOSIS — E785 Hyperlipidemia, unspecified: Secondary | ICD-10-CM | POA: Diagnosis not present

## 2015-04-28 DIAGNOSIS — J449 Chronic obstructive pulmonary disease, unspecified: Secondary | ICD-10-CM | POA: Diagnosis not present

## 2015-04-28 DIAGNOSIS — E119 Type 2 diabetes mellitus without complications: Secondary | ICD-10-CM | POA: Diagnosis not present

## 2015-04-28 DIAGNOSIS — E1121 Type 2 diabetes mellitus with diabetic nephropathy: Secondary | ICD-10-CM | POA: Diagnosis not present

## 2015-04-28 DIAGNOSIS — R0602 Shortness of breath: Secondary | ICD-10-CM | POA: Diagnosis not present

## 2015-04-28 DIAGNOSIS — E668 Other obesity: Secondary | ICD-10-CM | POA: Diagnosis not present

## 2015-04-28 DIAGNOSIS — I119 Hypertensive heart disease without heart failure: Secondary | ICD-10-CM | POA: Diagnosis not present

## 2015-05-14 DIAGNOSIS — H04123 Dry eye syndrome of bilateral lacrimal glands: Secondary | ICD-10-CM | POA: Diagnosis not present

## 2015-05-14 DIAGNOSIS — Z961 Presence of intraocular lens: Secondary | ICD-10-CM | POA: Diagnosis not present

## 2015-05-14 DIAGNOSIS — H401131 Primary open-angle glaucoma, bilateral, mild stage: Secondary | ICD-10-CM | POA: Diagnosis not present

## 2015-05-14 DIAGNOSIS — E119 Type 2 diabetes mellitus without complications: Secondary | ICD-10-CM | POA: Diagnosis not present

## 2015-10-30 DIAGNOSIS — I10 Essential (primary) hypertension: Secondary | ICD-10-CM | POA: Diagnosis not present

## 2015-10-30 DIAGNOSIS — E875 Hyperkalemia: Secondary | ICD-10-CM | POA: Diagnosis not present

## 2015-10-30 DIAGNOSIS — N179 Acute kidney failure, unspecified: Secondary | ICD-10-CM | POA: Diagnosis not present

## 2015-10-30 DIAGNOSIS — N183 Chronic kidney disease, stage 3 (moderate): Secondary | ICD-10-CM | POA: Diagnosis not present

## 2015-10-30 DIAGNOSIS — R0600 Dyspnea, unspecified: Secondary | ICD-10-CM | POA: Diagnosis not present

## 2015-10-30 DIAGNOSIS — E1129 Type 2 diabetes mellitus with other diabetic kidney complication: Secondary | ICD-10-CM | POA: Diagnosis not present

## 2015-10-30 DIAGNOSIS — R140 Abdominal distension (gaseous): Secondary | ICD-10-CM | POA: Diagnosis not present

## 2015-11-18 DIAGNOSIS — M179 Osteoarthritis of knee, unspecified: Secondary | ICD-10-CM | POA: Diagnosis not present

## 2015-11-18 DIAGNOSIS — E1121 Type 2 diabetes mellitus with diabetic nephropathy: Secondary | ICD-10-CM | POA: Diagnosis not present

## 2015-11-18 DIAGNOSIS — I10 Essential (primary) hypertension: Secondary | ICD-10-CM | POA: Diagnosis not present

## 2015-11-18 DIAGNOSIS — E7800 Pure hypercholesterolemia, unspecified: Secondary | ICD-10-CM | POA: Diagnosis not present

## 2015-11-18 DIAGNOSIS — M545 Low back pain: Secondary | ICD-10-CM | POA: Diagnosis not present

## 2015-11-18 DIAGNOSIS — K219 Gastro-esophageal reflux disease without esophagitis: Secondary | ICD-10-CM | POA: Diagnosis not present

## 2015-11-18 DIAGNOSIS — G4700 Insomnia, unspecified: Secondary | ICD-10-CM | POA: Diagnosis not present

## 2015-11-18 DIAGNOSIS — J449 Chronic obstructive pulmonary disease, unspecified: Secondary | ICD-10-CM | POA: Diagnosis not present

## 2015-11-26 DIAGNOSIS — H401131 Primary open-angle glaucoma, bilateral, mild stage: Secondary | ICD-10-CM | POA: Diagnosis not present

## 2015-11-26 DIAGNOSIS — Z961 Presence of intraocular lens: Secondary | ICD-10-CM | POA: Diagnosis not present

## 2015-12-01 DIAGNOSIS — E785 Hyperlipidemia, unspecified: Secondary | ICD-10-CM | POA: Diagnosis not present

## 2015-12-01 DIAGNOSIS — E119 Type 2 diabetes mellitus without complications: Secondary | ICD-10-CM | POA: Diagnosis not present

## 2015-12-01 DIAGNOSIS — R0602 Shortness of breath: Secondary | ICD-10-CM | POA: Diagnosis not present

## 2015-12-01 DIAGNOSIS — I119 Hypertensive heart disease without heart failure: Secondary | ICD-10-CM | POA: Diagnosis not present

## 2015-12-01 DIAGNOSIS — J449 Chronic obstructive pulmonary disease, unspecified: Secondary | ICD-10-CM | POA: Diagnosis not present

## 2015-12-01 DIAGNOSIS — I5032 Chronic diastolic (congestive) heart failure: Secondary | ICD-10-CM | POA: Diagnosis not present

## 2015-12-01 DIAGNOSIS — E668 Other obesity: Secondary | ICD-10-CM | POA: Diagnosis not present

## 2015-12-01 DIAGNOSIS — E1121 Type 2 diabetes mellitus with diabetic nephropathy: Secondary | ICD-10-CM | POA: Diagnosis not present

## 2015-12-01 DIAGNOSIS — N183 Chronic kidney disease, stage 3 (moderate): Secondary | ICD-10-CM | POA: Diagnosis not present

## 2015-12-26 DIAGNOSIS — M461 Sacroiliitis, not elsewhere classified: Secondary | ICD-10-CM | POA: Diagnosis not present

## 2015-12-26 DIAGNOSIS — M797 Fibromyalgia: Secondary | ICD-10-CM | POA: Diagnosis not present

## 2016-01-09 DIAGNOSIS — N183 Chronic kidney disease, stage 3 (moderate): Secondary | ICD-10-CM | POA: Diagnosis not present

## 2016-01-27 DIAGNOSIS — M791 Myalgia: Secondary | ICD-10-CM | POA: Diagnosis not present

## 2016-01-27 DIAGNOSIS — M5416 Radiculopathy, lumbar region: Secondary | ICD-10-CM | POA: Diagnosis not present

## 2016-01-27 DIAGNOSIS — M533 Sacrococcygeal disorders, not elsewhere classified: Secondary | ICD-10-CM | POA: Diagnosis not present

## 2016-01-27 DIAGNOSIS — M47817 Spondylosis without myelopathy or radiculopathy, lumbosacral region: Secondary | ICD-10-CM | POA: Diagnosis not present

## 2016-02-24 DIAGNOSIS — M791 Myalgia: Secondary | ICD-10-CM | POA: Diagnosis not present

## 2016-02-24 DIAGNOSIS — M47817 Spondylosis without myelopathy or radiculopathy, lumbosacral region: Secondary | ICD-10-CM | POA: Diagnosis not present

## 2016-02-24 DIAGNOSIS — M5416 Radiculopathy, lumbar region: Secondary | ICD-10-CM | POA: Diagnosis not present

## 2016-02-24 DIAGNOSIS — M533 Sacrococcygeal disorders, not elsewhere classified: Secondary | ICD-10-CM | POA: Diagnosis not present

## 2016-03-05 DIAGNOSIS — M79673 Pain in unspecified foot: Secondary | ICD-10-CM | POA: Diagnosis not present

## 2016-03-09 ENCOUNTER — Emergency Department (HOSPITAL_COMMUNITY): Payer: PPO

## 2016-03-09 ENCOUNTER — Observation Stay (HOSPITAL_BASED_OUTPATIENT_CLINIC_OR_DEPARTMENT_OTHER): Payer: PPO

## 2016-03-09 ENCOUNTER — Inpatient Hospital Stay (HOSPITAL_COMMUNITY)
Admission: EM | Admit: 2016-03-09 | Discharge: 2016-03-11 | DRG: 682 | Disposition: A | Discharge status: Home / Self Care | Payer: PPO | Attending: Family Medicine | Admitting: Family Medicine

## 2016-03-09 ENCOUNTER — Encounter (HOSPITAL_COMMUNITY): Payer: Self-pay | Admitting: *Deleted

## 2016-03-09 DIAGNOSIS — G4733 Obstructive sleep apnea (adult) (pediatric): Secondary | ICD-10-CM | POA: Diagnosis present

## 2016-03-09 DIAGNOSIS — M79609 Pain in unspecified limb: Secondary | ICD-10-CM

## 2016-03-09 DIAGNOSIS — Z91012 Allergy to eggs: Secondary | ICD-10-CM | POA: Diagnosis not present

## 2016-03-09 DIAGNOSIS — J449 Chronic obstructive pulmonary disease, unspecified: Secondary | ICD-10-CM | POA: Diagnosis not present

## 2016-03-09 DIAGNOSIS — E1122 Type 2 diabetes mellitus with diabetic chronic kidney disease: Secondary | ICD-10-CM | POA: Diagnosis not present

## 2016-03-09 DIAGNOSIS — M10071 Idiopathic gout, right ankle and foot: Secondary | ICD-10-CM | POA: Diagnosis not present

## 2016-03-09 DIAGNOSIS — I5042 Chronic combined systolic (congestive) and diastolic (congestive) heart failure: Secondary | ICD-10-CM | POA: Diagnosis present

## 2016-03-09 DIAGNOSIS — R600 Localized edema: Secondary | ICD-10-CM | POA: Diagnosis not present

## 2016-03-09 DIAGNOSIS — R0789 Other chest pain: Secondary | ICD-10-CM | POA: Diagnosis not present

## 2016-03-09 DIAGNOSIS — N289 Disorder of kidney and ureter, unspecified: Secondary | ICD-10-CM | POA: Insufficient documentation

## 2016-03-09 DIAGNOSIS — Z888 Allergy status to other drugs, medicaments and biological substances status: Secondary | ICD-10-CM | POA: Diagnosis not present

## 2016-03-09 DIAGNOSIS — Z87891 Personal history of nicotine dependence: Secondary | ICD-10-CM

## 2016-03-09 DIAGNOSIS — R509 Fever, unspecified: Secondary | ICD-10-CM | POA: Diagnosis not present

## 2016-03-09 DIAGNOSIS — Y92009 Unspecified place in unspecified non-institutional (private) residence as the place of occurrence of the external cause: Secondary | ICD-10-CM

## 2016-03-09 DIAGNOSIS — T404X5A Adverse effect of other synthetic narcotics, initial encounter: Secondary | ICD-10-CM | POA: Diagnosis not present

## 2016-03-09 DIAGNOSIS — R202 Paresthesia of skin: Secondary | ICD-10-CM | POA: Diagnosis not present

## 2016-03-09 DIAGNOSIS — N183 Chronic kidney disease, stage 3 unspecified: Secondary | ICD-10-CM | POA: Diagnosis present

## 2016-03-09 DIAGNOSIS — R001 Bradycardia, unspecified: Secondary | ICD-10-CM

## 2016-03-09 DIAGNOSIS — Z794 Long term (current) use of insulin: Secondary | ICD-10-CM

## 2016-03-09 DIAGNOSIS — E118 Type 2 diabetes mellitus with unspecified complications: Secondary | ICD-10-CM

## 2016-03-09 DIAGNOSIS — M79671 Pain in right foot: Secondary | ICD-10-CM | POA: Diagnosis not present

## 2016-03-09 DIAGNOSIS — Z880 Allergy status to penicillin: Secondary | ICD-10-CM | POA: Diagnosis not present

## 2016-03-09 DIAGNOSIS — I130 Hypertensive heart and chronic kidney disease with heart failure and stage 1 through stage 4 chronic kidney disease, or unspecified chronic kidney disease: Secondary | ICD-10-CM | POA: Diagnosis present

## 2016-03-09 DIAGNOSIS — T426X5A Adverse effect of other antiepileptic and sedative-hypnotic drugs, initial encounter: Secondary | ICD-10-CM | POA: Diagnosis not present

## 2016-03-09 DIAGNOSIS — I6789 Other cerebrovascular disease: Secondary | ICD-10-CM | POA: Diagnosis not present

## 2016-03-09 DIAGNOSIS — G92 Toxic encephalopathy: Secondary | ICD-10-CM | POA: Diagnosis present

## 2016-03-09 DIAGNOSIS — M10072 Idiopathic gout, left ankle and foot: Secondary | ICD-10-CM | POA: Diagnosis present

## 2016-03-09 DIAGNOSIS — D72829 Elevated white blood cell count, unspecified: Secondary | ICD-10-CM | POA: Diagnosis present

## 2016-03-09 DIAGNOSIS — M79672 Pain in left foot: Secondary | ICD-10-CM

## 2016-03-09 DIAGNOSIS — L409 Psoriasis, unspecified: Secondary | ICD-10-CM | POA: Diagnosis present

## 2016-03-09 DIAGNOSIS — N179 Acute kidney failure, unspecified: Principal | ICD-10-CM

## 2016-03-09 DIAGNOSIS — N170 Acute kidney failure with tubular necrosis: Secondary | ICD-10-CM | POA: Diagnosis not present

## 2016-03-09 DIAGNOSIS — E785 Hyperlipidemia, unspecified: Secondary | ICD-10-CM | POA: Diagnosis present

## 2016-03-09 DIAGNOSIS — N189 Chronic kidney disease, unspecified: Secondary | ICD-10-CM

## 2016-03-09 HISTORY — DX: Chronic kidney disease, stage 3 (moderate): N18.3

## 2016-03-09 HISTORY — DX: Chronic kidney disease, stage 3 unspecified: N18.30

## 2016-03-09 LAB — BASIC METABOLIC PANEL
Anion gap: 9 (ref 5–15)
Anion gap: 10 (ref 5–15)
BUN: 79 mg/dL — ABNORMAL HIGH (ref 6–20)
BUN: 79 mg/dL — ABNORMAL HIGH (ref 6–20)
CO2: 25 mmol/L (ref 22–32)
CO2: 23 mmol/L (ref 22–32)
Calcium: 8.8 mg/dL — ABNORMAL LOW (ref 8.9–10.3)
Calcium: 8.3 mg/dL — ABNORMAL LOW (ref 8.9–10.3)
Chloride: 102 mmol/L (ref 101–111)
Chloride: 103 mmol/L (ref 101–111)
Creatinine, Ser: 4.42 mg/dL — ABNORMAL HIGH (ref 0.44–1.00)
Creatinine, Ser: 4.39 mg/dL — ABNORMAL HIGH (ref 0.44–1.00)
GFR calc Af Amer: 10 mL/min — ABNORMAL LOW (ref >60)
GFR calc Af Amer: 11 mL/min — ABNORMAL LOW (ref >60)
GFR calc non Af Amer: 9 mL/min — ABNORMAL LOW (ref >60)
GFR calc non Af Amer: 9 mL/min — ABNORMAL LOW (ref >60)
Glucose, Bld: 201 mg/dL — ABNORMAL HIGH (ref 65–99)
Glucose, Bld: 225 mg/dL — ABNORMAL HIGH (ref 65–99)
Potassium: 4.9 mmol/L (ref 3.5–5.1)
Potassium: 4.2 mmol/L (ref 3.5–5.1)
Sodium: 136 mmol/L (ref 135–145)
Sodium: 136 mmol/L (ref 135–145)

## 2016-03-09 LAB — CBC WITH DIFFERENTIAL/PLATELET
Basophils Absolute: 0 10*3/uL (ref 0.0–0.1)
Basophils Relative: 0 %
Eosinophils Absolute: 0.1 10*3/uL (ref 0.0–0.7)
Eosinophils Relative: 1 %
HCT: 35.3 % — ABNORMAL LOW (ref 36.0–46.0)
Hemoglobin: 11.1 g/dL — ABNORMAL LOW (ref 12.0–15.0)
Lymphocytes Relative: 14 %
Lymphs Abs: 2.1 10*3/uL (ref 0.7–4.0)
MCH: 26.2 pg (ref 26.0–34.0)
MCHC: 31.4 g/dL (ref 30.0–36.0)
MCV: 83.3 fL (ref 78.0–100.0)
Monocytes Absolute: 0.8 10*3/uL (ref 0.1–1.0)
Monocytes Relative: 5 %
Neutro Abs: 11.7 10*3/uL — ABNORMAL HIGH (ref 1.7–7.7)
Neutrophils Relative %: 80 %
Platelets: 317 10*3/uL (ref 150–400)
RBC: 4.24 MIL/uL (ref 3.87–5.11)
RDW: 15.4 % (ref 11.5–15.5)
WBC: 14.7 10*3/uL — ABNORMAL HIGH (ref 4.0–10.5)

## 2016-03-09 LAB — URINALYSIS, ROUTINE W REFLEX MICROSCOPIC
Bacteria, UA: NONE SEEN
Bilirubin Urine: NEGATIVE
Glucose, UA: NEGATIVE mg/dL
Hgb urine dipstick: NEGATIVE
Ketones, ur: NEGATIVE mg/dL
Nitrite: NEGATIVE
Protein, ur: 30 mg/dL — AB
Specific Gravity, Urine: 1.009 (ref 1.005–1.030)
pH: 5 (ref 5.0–8.0)

## 2016-03-09 LAB — C-REACTIVE PROTEIN: CRP: 4.7 mg/dL — ABNORMAL HIGH (ref <1.0)

## 2016-03-09 LAB — URIC ACID: Uric Acid, Serum: 10.7 mg/dL — ABNORMAL HIGH (ref 2.3–6.6)

## 2016-03-09 LAB — AMMONIA: Ammonia: 24 umol/L (ref 9–35)

## 2016-03-09 LAB — I-STAT CG4 LACTIC ACID, ED: Lactic Acid, Venous: 1.21 mmol/L (ref 0.5–1.9)

## 2016-03-09 LAB — GLUCOSE, CAPILLARY
Glucose-Capillary: 268 mg/dL — ABNORMAL HIGH (ref 65–99)
Glucose-Capillary: 229 mg/dL — ABNORMAL HIGH (ref 65–99)
Glucose-Capillary: 213 mg/dL — ABNORMAL HIGH (ref 65–99)

## 2016-03-09 LAB — PROCALCITONIN: Procalcitonin: <0.1 ng/mL

## 2016-03-09 LAB — SEDIMENTATION RATE: Sed Rate: 115 mm/hr — ABNORMAL HIGH (ref 0–22)

## 2016-03-09 MED ORDER — INSULIN ASPART 100 UNIT/ML ~~LOC~~ SOLN
0.0000 [IU] | Freq: Three times a day (TID) | SUBCUTANEOUS | Status: DC
  Administered 2016-03-09: 5 [IU] via SUBCUTANEOUS
  Administered 2016-03-09: 3 [IU] via SUBCUTANEOUS
  Administered 2016-03-10: 7 [IU] via SUBCUTANEOUS
  Administered 2016-03-10: 9 [IU] via SUBCUTANEOUS
  Administered 2016-03-10 – 2016-03-11 (×3): 3 [IU] via SUBCUTANEOUS

## 2016-03-09 MED ORDER — PREDNISONE 20 MG PO TABS
40.0000 mg | ORAL_TABLET | Freq: Every day | ORAL | Status: DC
  Administered 2016-03-09 – 2016-03-11 (×3): 40 mg via ORAL
  Filled 2016-03-09 (×3): qty 2

## 2016-03-09 MED ORDER — SODIUM CHLORIDE 0.9 % IV BOLUS (SEPSIS)
1000.0000 mL | Freq: Once | INTRAVENOUS | Status: AC
  Administered 2016-03-09: 1000 mL via INTRAVENOUS

## 2016-03-09 MED ORDER — METOPROLOL SUCCINATE ER 25 MG PO TB24
25.0000 mg | ORAL_TABLET | Freq: Two times a day (BID) | ORAL | Status: DC
  Administered 2016-03-09 – 2016-03-11 (×5): 25 mg via ORAL
  Filled 2016-03-09 (×5): qty 1

## 2016-03-09 MED ORDER — GABAPENTIN 100 MG PO CAPS
100.0000 mg | ORAL_CAPSULE | Freq: Three times a day (TID) | ORAL | Status: DC
  Administered 2016-03-09 – 2016-03-10 (×5): 100 mg via ORAL
  Filled 2016-03-09 (×6): qty 1

## 2016-03-09 MED ORDER — FUROSEMIDE 20 MG PO TABS
80.0000 mg | ORAL_TABLET | Freq: Every day | ORAL | Status: DC
  Administered 2016-03-10: 80 mg via ORAL
  Filled 2016-03-09: qty 4

## 2016-03-09 MED ORDER — IRBESARTAN 300 MG PO TABS
300.0000 mg | ORAL_TABLET | Freq: Every day | ORAL | Status: DC
Start: 2016-03-09 — End: 2016-03-10
  Administered 2016-03-09 – 2016-03-10 (×2): 300 mg via ORAL
  Filled 2016-03-09 (×2): qty 1

## 2016-03-09 MED ORDER — ACETAMINOPHEN 325 MG PO TABS
650.0000 mg | ORAL_TABLET | Freq: Four times a day (QID) | ORAL | Status: DC | PRN
  Administered 2016-03-09 – 2016-03-11 (×4): 650 mg via ORAL
  Filled 2016-03-09 (×4): qty 2

## 2016-03-09 MED ORDER — INSULIN DETEMIR 100 UNIT/ML ~~LOC~~ SOLN
50.0000 [IU] | Freq: Every day | SUBCUTANEOUS | Status: DC
  Administered 2016-03-09 – 2016-03-10 (×2): 50 [IU] via SUBCUTANEOUS
  Filled 2016-03-09 (×2): qty 0.5

## 2016-03-09 MED ORDER — INSULIN ASPART 100 UNIT/ML ~~LOC~~ SOLN
0.0000 [IU] | Freq: Every day | SUBCUTANEOUS | Status: DC
  Administered 2016-03-09: 2 [IU] via SUBCUTANEOUS
  Administered 2016-03-10: 4 [IU] via SUBCUTANEOUS

## 2016-03-09 MED ORDER — ACETAMINOPHEN 325 MG PO TABS
650.0000 mg | ORAL_TABLET | Freq: Once | ORAL | Status: AC
  Administered 2016-03-09: 650 mg via ORAL
  Filled 2016-03-09: qty 2

## 2016-03-09 MED ORDER — ATORVASTATIN CALCIUM 80 MG PO TABS
80.0000 mg | ORAL_TABLET | Freq: Every day | ORAL | Status: DC
  Administered 2016-03-09 – 2016-03-11 (×3): 80 mg via ORAL
  Filled 2016-03-09 (×3): qty 1

## 2016-03-09 MED ORDER — DEXTROSE 5 % IV SOLN: 1.0000 g | Freq: Once | INTRAVENOUS | Status: DC

## 2016-03-09 MED ORDER — ACETAMINOPHEN 650 MG RE SUPP: 650.0000 mg | Freq: Four times a day (QID) | RECTAL | Status: DC | PRN

## 2016-03-09 MED ORDER — MOMETASONE FURO-FORMOTEROL FUM 200-5 MCG/ACT IN AERO
2.0000 | INHALATION_SPRAY | Freq: Two times a day (BID) | RESPIRATORY_TRACT | Status: DC
  Administered 2016-03-09 – 2016-03-11 (×5): 2 via RESPIRATORY_TRACT
  Filled 2016-03-09: qty 8.8

## 2016-03-09 MED ORDER — LATANOPROST 0.005 % OP SOLN
1.0000 [drp] | Freq: Every day | OPHTHALMIC | Status: DC
  Administered 2016-03-09 – 2016-03-10 (×2): 1 [drp] via OPHTHALMIC
  Filled 2016-03-09: qty 2.5

## 2016-03-09 MED ORDER — SODIUM CHLORIDE 0.9 % IV SOLN
INTRAVENOUS | Status: DC
  Administered 2016-03-09 – 2016-03-11 (×4): via INTRAVENOUS

## 2016-03-09 MED ORDER — HEPARIN SODIUM (PORCINE) 5000 UNIT/ML IJ SOLN
5000.0000 [IU] | Freq: Three times a day (TID) | INTRAMUSCULAR | Status: DC
  Administered 2016-03-09 – 2016-03-11 (×6): 5000 [IU] via SUBCUTANEOUS
  Filled 2016-03-09 (×7): qty 1

## 2016-03-09 MED ORDER — PROMETHAZINE HCL 25 MG PO TABS: 12.5000 mg | ORAL_TABLET | Freq: Four times a day (QID) | ORAL | Status: DC | PRN

## 2016-03-09 MED ORDER — TRAMADOL HCL 50 MG PO TABS
50.0000 mg | ORAL_TABLET | Freq: Two times a day (BID) | ORAL | Status: DC | PRN
  Administered 2016-03-10: 50 mg via ORAL
  Filled 2016-03-09: qty 1

## 2016-03-09 NOTE — ED Notes (Signed)
Pt unable to provide urine sample at this time. Is drinking water

## 2016-03-09 NOTE — ED Notes (Signed)
Pt placed on bedpan

## 2016-03-09 NOTE — ED Notes (Signed)
Pt taken to xray 

## 2016-03-09 NOTE — Progress Notes (Signed)
   Multiple labs have returned concerning for gout. ESR 1:15, CRP 4.7, uric acid 10.7. Due to patient's deterioration of renal function will start patient on prednisone 67m daily. Patient is on sliding scale insulin to compensate for likely hyperglycemia in the days to come.  DLinna Darner MD [facility] 03/09/2016, 3:57 PM

## 2016-03-09 NOTE — ED Provider Notes (Signed)
[place] DEPT Provider Note   CSN: [id number] Arrival date & time: 03/09/16  0500     History   Chief Complaint Chief Complaint  Patient presents with  . Foot Pain    HPI Stephanie Woodward is a 76 y.o. female.  The history is provided by the patient and a relative.  Foot Pain  This is a new problem. The problem occurs constantly. The problem has been gradually worsening. Pertinent negatives include no headaches. Exacerbated by: movement and palpation. The symptoms are relieved by rest.   Patient with h/o renal dysfunction, COPD, hypertension presents with worsening right foot pain over past several days No trauma She reports it is painful and numb at times  Family is at bedside and reports recent intermittent episodes of confusion and sleepiness She recently had increase in tramadol and also gabapentin by PCP earlier this month Her mental status seemed more pronounced after the medication change  She also has mentioned some dysuria as well  Past Medical History:  Diagnosis Date  . Arthritis   . Asthma   . CHF (congestive heart failure) ([place])   . COPD (chronic obstructive pulmonary disease) ([place])   . Diabetes mellitus    INSULIN DEPENDENT  . Gout   . Hyperlipemia   . Hypertension   . Psoriasis   . Renal disorder   . Single kidney     Patient Active Problem List   Diagnosis Date Noted  . CHF (congestive heart failure) ([place]) 02/06/2014  . Cellulitis of right lower extremity 02/06/2014  . Essential hypertension 02/06/2014  . Hyperlipidemia 02/06/2014  . Diabetes mellitus type 2, controlled ([place]) 02/06/2014  . Cellulitis 02/06/2014  . OSA (obstructive sleep apnea) 01/22/2014  . Hypoxemia 12/07/2013  . Type 2 diabetes mellitus, controlled, with renal complications ([place]) 62/04/5595  . Kidney disease, chronic, stage III (GFR 30-59 ml/min) 12/07/2013  . Pulmonary hypertension 12/07/2013  . Chronic combined systolic and diastolic CHF (congestive heart failure)  ([place])   . Acute on chronic diastolic CHF (congestive heart failure), NYHA class 1 ([place]) 12/06/2013  . CHF exacerbation ([place]) 12/05/2013  . SOB (shortness of breath) 12/05/2013  . Gout 11/11/2009  . Obesity 11/11/2009  . Hypertensive heart disease     Past Surgical History:  Procedure Laterality Date  . CHOLECYSTECTOMY    . fibroid tumor    . gallbladder  1978  . LEFT AND RIGHT HEART CATHETERIZATION WITH CORONARY ANGIOGRAM N/A 11/29/2013   Procedure: LEFT AND RIGHT HEART CATHETERIZATION WITH CORONARY ANGIOGRAM;  Surgeon: Jacolyn Reedy, MD;  Location: [facility] CATH LAB;  Service: Cardiovascular;  Laterality: N/A;    OB History    No data available       Home Medications    Prior to Admission medications   Medication Sig Start Date End Date Taking? Authorizing Provider  albuterol (PROVENTIL HFA;VENTOLIN HFA) 108 (90 BASE) MCG/ACT inhaler Inhale 2 puffs into the lungs every 4 (four) hours as needed for shortness of breath.    Yes Historical Provider, MD  atorvastatin (LIPITOR) 80 MG tablet Take 80 mg by mouth daily.   Yes Historical Provider, MD  Fluticasone-Salmeterol (ADVAIR) 250-50 MCG/DOSE AEPB Inhale 1 puff into the lungs 2 (two) times daily.   Yes Historical Provider, MD  furosemide (LASIX) 80 MG tablet Take 80 mg by mouth daily.   Yes Historical Provider, MD  gabapentin (NEURONTIN) 100 MG capsule Take 100 mg by mouth 3 (three) times daily.   Yes Historical Provider, MD  insulin aspart (NOVOLOG) 100  UNIT/ML injection Inject 10 Units into the skin 3 (three) times daily before meals. If sugar is over 200   Yes Historical Provider, MD  insulin detemir (LEVEMIR) 100 UNIT/ML injection Inject 0.5 mLs (50 Units total) into the skin daily. Patient taking differently: Inject 55 Units into the skin daily.  12/07/13  Yes Jacolyn Reedy, MD  latanoprost (XALATAN) 0.005 % ophthalmic solution Place 1 drop into both eyes at bedtime.   Yes Historical Provider, MD  linagliptin (TRADJENTA) 5 MG  TABS tablet Take 5 mg by mouth daily.   Yes Historical Provider, MD  metoprolol succinate (TOPROL-XL) 25 MG 24 hr tablet Take 25 mg by mouth 2 (two) times daily.    Yes Historical Provider, MD  sulfamethoxazole-trimethoprim (BACTRIM DS,SEPTRA DS) 800-160 MG tablet Take 1 tablet by mouth 2 (two) times daily.   Yes Historical Provider, MD  telmisartan (MICARDIS) 80 MG tablet Take 80 mg by mouth daily.   Yes Historical Provider, MD  traMADol (ULTRAM) 50 MG tablet Take 50 mg by mouth 2 (two) times daily.   Yes Historical Provider, MD  diphenhydrAMINE (BENADRYL) 25 mg capsule Take 1 capsule (25 mg total) by mouth every 6 (six) hours as needed for itching. Patient not taking: Reported on 03/09/2016 02/08/14   Ripudeep Krystal Eaton, MD  diphenhydrAMINE-zinc acetate (BENADRYL) cream Apply topically daily as needed for itching. To legs Patient not taking: Reported on 03/09/2016 02/08/14   Ripudeep Krystal Eaton, MD  furosemide (LASIX) 40 MG tablet Take 1 tablet (40 mg total) by mouth 2 (two) times daily. Patient not taking: Reported on 03/09/2016 12/07/13   Jacolyn Reedy, MD    Family History Family History  Problem Relation Age of Onset  . Lupus Daughter   . Cancer Mother     Social History Social History  Substance Use Topics  . Smoking status: Former Smoker    Packs/day: 1.00    Years: 20.00    Types: Cigarettes    Quit date: 02/22/2009  . Smokeless tobacco: Never Used  . Alcohol use No     Comment: quit 2011     Allergies   Eggs or egg-derived products; Lisinopril; and Penicillins   Review of Systems Review of Systems  Constitutional: Positive for fatigue.  Respiratory: Negative for cough.   Gastrointestinal: Negative for vomiting.  Genitourinary: Positive for dysuria and frequency.  Musculoskeletal: Positive for arthralgias.  Skin: Positive for rash.       Rash to face over past several days   Neurological: Negative for headaches.  All other systems reviewed and are  negative.    Physical Exam Updated Vital Signs BP (!) 126/54   Pulse 62   Temp 99.7 F (37.6 C) (Oral)   Resp 20   Ht 5\' 3"  (1.6 m)   Wt 90.7 kg   SpO2 94%   BMI 35.43 kg/m   Physical Exam CONSTITUTIONAL: elderly, no acute distress HEAD: Normocephalic/atraumatic EYES: EOMI/PERRL ENMT: Mucous membranes moist, dry skin noted to face.  No angioedema noted NECK: supple no meningeal signs SPINE/BACK:entire spine nontender CV: S1/S2 noted, no murmurs/rubs/gallops noted LUNGS: Lungs are clear to auscultation bilaterally, no apparent distress ABDOMEN: soft, nontender,no suprapubic tenderness GU:no cva tenderness NEURO: Pt is awake/alert/appropriate, moves all extremitiesx4.  No facial droop.   EXTREMITIES: pulses normal/equal, full ROM Distal pulses equal/intact.  No erythema/crepitus to right foot.  No edema to foot.  No significant erythema of right foot.  The right foot is warm to touch.  Chronic skin changes  noted Tenderness to palpation of right foot.  No bruising No wounds between toes.  No puncture wounds noted SKIN: warm, color normal PSYCH: no abnormalities of mood noted, alert and oriented to situation   ED Treatments / Results  Labs (all labs ordered are listed, but only abnormal results are displayed) Labs Reviewed  BASIC METABOLIC PANEL - Abnormal; Notable for the following:       Result Value   Glucose, Bld 201 (*)    BUN 79 (*)    Creatinine, Ser 4.42 (*)    Calcium 8.8 (*)    GFR calc non Af Amer 9 (*)    GFR calc Af Amer 10 (*)    All other components within normal limits  CBC WITH DIFFERENTIAL/PLATELET - Abnormal; Notable for the following:    WBC 14.7 (*)    Hemoglobin 11.1 (*)    HCT 35.3 (*)    Neutro Abs 11.7 (*)    All other components within normal limits  URINALYSIS, ROUTINE W REFLEX MICROSCOPIC - Abnormal; Notable for the following:    Color, Urine STRAW (*)    Protein, ur 30 (*)    Leukocytes, UA TRACE (*)    Squamous Epithelial / LPF 0-5  (*)    All other components within normal limits  URINE CULTURE  I-STAT CG4 LACTIC ACID, ED    EKG  EKG Interpretation None       Radiology Dg Chest 2 View  Result Date: 03/09/2016 CLINICAL DATA:  Fever. EXAM: CHEST  2 VIEW COMPARISON:  02/05/2014 FINDINGS: Stable cardiomegaly and mediastinal contours. Again seen bronchial thickening and interstitial prominence. No focal airspace disease to suggest pneumonia. No evidence of pleural fluid. No pneumothorax. Osseous structures are unchanged. IMPRESSION: Stable cardiomegaly. Peribronchial thickening and interstitial prominence, suspicious for pulmonary edema. No pneumonia. Electronically Signed   By: Jeb Levering M.D.   On: 03/09/2016 06:03   Dg Foot Complete Right  Result Date: 03/09/2016 CLINICAL DATA:  Right foot pain.  Fever. EXAM: RIGHT FOOT COMPLETE - 3+ VIEW COMPARISON:  None. FINDINGS: There is no evidence of fracture or dislocation. Mild scattered osteoarthritis. No bony destructive change. Small plantar calcaneal spur and Achilles tendon enthesophyte. Diffuse soft tissue edema is seen. Vague increased densities noted projecting over Kager's fat pad on the lateral view. No soft tissue air or radiopaque foreign body. IMPRESSION: 1. No acute bony abnormality.  Diffuse soft tissue edema. 2. Scattered osteoarthritis. 3. Vague increased densities projecting over Kager's fat pad on the lateral view, nonspecific. MRI characterization could be considered. Electronically Signed   By: Jeb Levering M.D.   On: 03/09/2016 06:07    Procedures Procedures   Medications Ordered in ED Medications  acetaminophen (TYLENOL) tablet 650 mg (650 mg Oral Given 03/09/16 0531)     Initial Impression / Assessment and Plan / ED Course  I have reviewed the triage vital signs and the nursing notes.  Pertinent labs & imaging results that were available during my care of the patient were reviewed by me and considered in my medical decision making (see  chart for details).  Clinical Course     6:52 AM Pt here for multiple issues:  Right foot pain - unclear etiology, no obvious signs of cellulitis Pulses equal/intact.    Also noted to have fever.  CXR negative for pneumonia Urinalysis pending  She has worsening renal function, unclear baseline (previous labs from 2015 shows creatinine <2) This may contribute to AMS.  Also, recent med changes could  contribute to her AMS 7:08 AM Pt stable Reports pain in foot improved.  No obvious signs of cellulitis or abscess Unclear cause of fever (cxr negative for pneumonia, no obvious UTI) Also - no HA, no meningeal signs.  She is awake/alert and not confused currently She denies active CP/ABD pain at this time  Due to likely worsening renal function, will admit D/w dr danford for admission He has placed bed request   Final Clinical Impressions(s) / ED Diagnoses   Final diagnoses:  AKI (acute kidney injury) ([place])  Acute febrile illness    New Prescriptions New Prescriptions   No medications on file     Ripley Fraise, MD 03/09/16 [phone]

## 2016-03-09 NOTE — H&P (Signed)
History and Physical    Stephanie Woodward BWG:[id number] DOB: June 20, 1940 DOA: 03/09/2016  PCP: Donnie Coffin, MD Patient coming from: Home  Chief Complaint: Foot pain and confusion  HPI: Stephanie Woodward is a 76 y.o. female with medical history significant of CHF, COPD, gout, HLD, HTN, psoriasis, C KD presenting with foot pain and mild confusion. Patient endorses symptoms beginning with left foot pain approximately 4 days ago. Patient went to her PCP for this and was told that she had a skin infection but was not given any medications to treat the problem. Pain is worse with ambulation. Improves with rest. Pain slowly improved in the left foot but then began in the right foot. Patient states that she feels that her foot is swelling as well as a right lower extremity. Patient states she's never had this problem before. Patient is attended to by her daughter who also states that patient has been intermittently confused over the last few days. This began after patient was started on an increased dose of tramadol and Neurontin by her primary care physician. Denies any fevers, general malaise, cough, chest pain, palpitations, shortness of breath, neck stiffness, headaches, abdominal pain, dysuria, frequency, back pain, rash, focal neurological deficits such as facial asymmetry or unilateral extremity weakness.   ED Course:  Objective findings outlined below. Started on ceftriaxone  Review of Systems: As per HPI otherwise 10 point review of systems negative.   Ambulatory Status: no restrictions at baseline  Past Medical History:  Diagnosis Date  . Arthritis   . Asthma   . CHF (congestive heart failure) ([place])   . COPD (chronic obstructive pulmonary disease) ([place])   . Diabetes mellitus    INSULIN DEPENDENT  . Gout   . Hyperlipemia   . Hypertension   . Psoriasis   . Renal disorder    congenital  . Single kidney     Past Surgical History:  Procedure Laterality Date  . CHOLECYSTECTOMY    .  fibroid tumor    . gallbladder  1978  . LEFT AND RIGHT HEART CATHETERIZATION WITH CORONARY ANGIOGRAM N/A 11/29/2013   Procedure: LEFT AND RIGHT HEART CATHETERIZATION WITH CORONARY ANGIOGRAM;  Surgeon: Jacolyn Reedy, MD;  Location: [facility] CATH LAB;  Service: Cardiovascular;  Laterality: N/A;    Social History   Social History  . Marital status: Widowed    Spouse name: N/A  . Number of children: 2  . Years of education: N/A   Occupational History  . retired    Social History Main Topics  . Smoking status: Former Smoker    Packs/day: 1.00    Years: 20.00    Types: Cigarettes    Quit date: 02/22/2009  . Smokeless tobacco: Never Used  . Alcohol use No     Comment: quit 2011  . Drug use: No  . Sexual activity: No   Other Topics Concern  . Not on file   Social History Narrative  . No narrative on file    Allergies  Allergen Reactions  . Eggs Or Egg-Derived Products Nausea And Vomiting  . Lisinopril   . Penicillins Nausea And Vomiting    Family History  Problem Relation Age of Onset  . Lupus Daughter   . Cancer Mother     Prior to Admission medications   Medication Sig Start Date End Date Taking? Authorizing Provider  albuterol (PROVENTIL HFA;VENTOLIN HFA) 108 (90 BASE) MCG/ACT inhaler Inhale 2 puffs into the lungs every 4 (four) hours as needed for shortness of breath.  Yes Historical Provider, MD  atorvastatin (LIPITOR) 80 MG tablet Take 80 mg by mouth daily.   Yes Historical Provider, MD  Fluticasone-Salmeterol (ADVAIR) 250-50 MCG/DOSE AEPB Inhale 1 puff into the lungs 2 (two) times daily.   Yes Historical Provider, MD  furosemide (LASIX) 80 MG tablet Take 80 mg by mouth daily.   Yes Historical Provider, MD  gabapentin (NEURONTIN) 100 MG capsule Take 100 mg by mouth 3 (three) times daily.   Yes Historical Provider, MD  insulin aspart (NOVOLOG) 100 UNIT/ML injection Inject 10 Units into the skin 3 (three) times daily before meals. If sugar is over 200   Yes Historical  Provider, MD  insulin detemir (LEVEMIR) 100 UNIT/ML injection Inject 0.5 mLs (50 Units total) into the skin daily. Patient taking differently: Inject 55 Units into the skin daily.  12/07/13  Yes Jacolyn Reedy, MD  latanoprost (XALATAN) 0.005 % ophthalmic solution Place 1 drop into both eyes at bedtime.   Yes Historical Provider, MD  linagliptin (TRADJENTA) 5 MG TABS tablet Take 5 mg by mouth daily.   Yes Historical Provider, MD  metoprolol succinate (TOPROL-XL) 25 MG 24 hr tablet Take 25 mg by mouth 2 (two) times daily.    Yes Historical Provider, MD  sulfamethoxazole-trimethoprim (BACTRIM DS,SEPTRA DS) 800-160 MG tablet Take 1 tablet by mouth 2 (two) times daily.   Yes Historical Provider, MD  telmisartan (MICARDIS) 80 MG tablet Take 80 mg by mouth daily.   Yes Historical Provider, MD  traMADol (ULTRAM) 50 MG tablet Take 50 mg by mouth 2 (two) times daily.   Yes Historical Provider, MD  diphenhydrAMINE (BENADRYL) 25 mg capsule Take 1 capsule (25 mg total) by mouth every 6 (six) hours as needed for itching. Patient not taking: Reported on 03/09/2016 02/08/14   Ripudeep Krystal Eaton, MD  diphenhydrAMINE-zinc acetate (BENADRYL) cream Apply topically daily as needed for itching. To legs Patient not taking: Reported on 03/09/2016 02/08/14   Ripudeep Krystal Eaton, MD  furosemide (LASIX) 40 MG tablet Take 1 tablet (40 mg total) by mouth 2 (two) times daily. Patient not taking: Reported on 03/09/2016 12/07/13   Jacolyn Reedy, MD    Physical Exam: Vitals:   03/09/16 0715 03/09/16 0730 03/09/16 0800 03/09/16 0849  BP: (!) 102/41 110/63 (!) 118/42 (!) 118/98  Pulse: (!) 50 (!) 56 (!) 49 99  Resp:    18  Temp:    99 F (37.2 C)  TempSrc:    Oral  SpO2: 94% 96% 94% 94%  Weight:    90.7 kg (200 lb)  Height:    '5\' 3"'  (1.6 m)     General:  Appears calm and comfortable Eyes:  PERRL, EOMI, normal lids, iris ENT:  grossly normal hearing, lips & tongue, mmm Neck:  no LAD, masses or thyromegaly Cardiovascular:   RRR, no m/r/g. Trace lower extremity edema on the left with 1+ lower extremity on the right.  Respiratory:  CTA bilaterally, no w/r/r. Normal respiratory effort. Abdomen:  soft, ntnd, NABS Skin: Fleshy Keratotic growths of the left ankle and left medial distal lower extremity. No significant erythema. Bilateral ankles and feet are tender to even light touch. Musculoskeletal:  grossly normal tone BUE/BLE, good ROM, no bony abnormality Psychiatric:  grossly normal mood and affect, speech fluent and appropriate, AOx3 Neurologic:  CN 2-12 grossly intact, moves all extremities in coordinated fashion, sensation intact  Labs on Admission: I have personally reviewed following labs and imaging studies  CBC:  Recent Labs Lab 03/09/16  0528  WBC 14.7*  NEUTROABS 11.7*  HGB 11.1*  HCT 35.3*  MCV 83.3  PLT 309   Basic Metabolic Panel:  Recent Labs Lab 03/09/16 0528  NA 136  K 4.9  CL 102  CO2 25  GLUCOSE 201*  BUN 79*  CREATININE 4.42*  CALCIUM 8.8*   GFR: Estimated Creatinine Clearance: 12.1 mL/min (by C-G formula based on SCr of 4.42 mg/dL (H)). Liver Function Tests: No results for input(s): AST, ALT, ALKPHOS, BILITOT, PROT, ALBUMIN in the last 168 hours. No results for input(s): LIPASE, AMYLASE in the last 168 hours. No results for input(s): AMMONIA in the last 168 hours. Coagulation Profile: No results for input(s): INR, PROTIME in the last 168 hours. Cardiac Enzymes: No results for input(s): CKTOTAL, CKMB, CKMBINDEX, TROPONINI in the last 168 hours. BNP (last 3 results) No results for input(s): PROBNP in the last 8760 hours. HbA1C: No results for input(s): HGBA1C in the last 72 hours. CBG: No results for input(s): GLUCAP in the last 168 hours. Lipid Profile: No results for input(s): CHOL, HDL, LDLCALC, TRIG, CHOLHDL, LDLDIRECT in the last 72 hours. Thyroid Function Tests: No results for input(s): TSH, T4TOTAL, FREET4, T3FREE, THYROIDAB in the last 72 hours. Anemia  Panel: No results for input(s): VITAMINB12, FOLATE, FERRITIN, TIBC, IRON, RETICCTPCT in the last 72 hours. Urine analysis:    Component Value Date/Time   COLORURINE STRAW (A) 03/09/2016 0648   APPEARANCEUR CLEAR 03/09/2016 0648   LABSPEC 1.009 03/09/2016 0648   PHURINE 5.0 03/09/2016 0648   GLUCOSEU NEGATIVE 03/09/2016 0648   HGBUR NEGATIVE 03/09/2016 0648   BILIRUBINUR NEGATIVE 03/09/2016 0648   KETONESUR NEGATIVE 03/09/2016 0648   PROTEINUR 30 (A) 03/09/2016 0648   UROBILINOGEN 0.2 12/05/2013 1657   NITRITE NEGATIVE 03/09/2016 0648   LEUKOCYTESUR TRACE (A) 03/09/2016 0648    Creatinine Clearance: Estimated Creatinine Clearance: 12.1 mL/min (by C-G formula based on SCr of 4.42 mg/dL (H)).  Sepsis Labs: '@LABRCNTIP' (procalcitonin:4,lacticidven:4) )No results found for this or any previous visit (from the past 240 hour(s)).   Radiological Exams on Admission: Dg Chest 2 View  Result Date: 03/09/2016 CLINICAL DATA:  Fever. EXAM: CHEST  2 VIEW COMPARISON:  02/05/2014 FINDINGS: Stable cardiomegaly and mediastinal contours. Again seen bronchial thickening and interstitial prominence. No focal airspace disease to suggest pneumonia. No evidence of pleural fluid. No pneumothorax. Osseous structures are unchanged. IMPRESSION: Stable cardiomegaly. Peribronchial thickening and interstitial prominence, suspicious for pulmonary edema. No pneumonia. Electronically Signed   By: Jeb Levering M.D.   On: 03/09/2016 06:03   Dg Foot Complete Right  Result Date: 03/09/2016 CLINICAL DATA:  Right foot pain.  Fever. EXAM: RIGHT FOOT COMPLETE - 3+ VIEW COMPARISON:  None. FINDINGS: There is no evidence of fracture or dislocation. Mild scattered osteoarthritis. No bony destructive change. Small plantar calcaneal spur and Achilles tendon enthesophyte. Diffuse soft tissue edema is seen. Vague increased densities noted projecting over Kager's fat pad on the lateral view. No soft tissue air or radiopaque foreign  body. IMPRESSION: 1. No acute bony abnormality.  Diffuse soft tissue edema. 2. Scattered osteoarthritis. 3. Vague increased densities projecting over Kager's fat pad on the lateral view, nonspecific. MRI characterization could be considered. Electronically Signed   By: Jeb Levering M.D.   On: 03/09/2016 06:07    EKG: pending   Assessment/Plan Active Problems:   Kidney disease, chronic, stage III (GFR 30-59 ml/min)   Hyperlipidemia   Acute on chronic renal failure (HCC)   Diabetes mellitus with complication ([place])  Foot pain, bilateral   Acute on chronic kidney disease: Congenital non-functioning L kidney since birth. Creatinine 4.4 BUN 79. Baseline Cr 2 and BUN 33. Likely secondary to poor oral intake and recent Bactrim initiation. Followed by [facility] in outpt setting. - IVF - BMET 1700 and in am - renal consult if not improving.  - Hold lasix and avapro until the am  Foot pain: R>L. Etiology unclear. 2+ dorsalis pedis pulses bilat. Fleshy keratotic changes likely unrelated. Xray w/o acute abnormality. Gout vs peripheral neuropathy. Elevated WBC and fever likely unrelated. Patient denies being started on antibiotics by PCP 1 day ago though she does have a prescription for Bactrim which was confirmed by pharmacy. - Uric acid level - consider MRI if not improving per radiology rec - Decrease neurontin and tramadol due to confusion - Venous duplex (LE edema R>L)  AMS: intermittent. Currently w/ complete faculties. Likely from increased tramadol and neurontin. Underlying infection may also be at play. May also have small amount of metabolic etiology given BUN 79. - decrease neurontin and tramadol - Monitor chemistries - Ammonia - Head CT if not improving  Leukocytosis/fever: pts only focal complaint is intermittent foot pain. Reactive vs viral vs bacterial. UA nml. CXR nml. Lactic acid 1.21 - procalcitonin - ESR, CRP.  - UCX sent by EDP  Bradycardia: asymptomatic - EKG,  Tele, trop x1  HTN: - continue metop - Resume avapro until am  COPD: at baseline - continue dulera  HLD: - continue liipitor  Congestive heart failure: Last echo showing an EF of 45%. Well compensated. - Continue Lasix 80 mg daily - Strict I's and O's, daily weights  DM: - continue levemir 50 units - SSI    DVT prophylaxis: Hep  Code Status: FULL  Family Communication: ndaughter  Disposition Plan: pending improvement and workup  Consults called: none  Admission status: obs    Karmine Kauer J MD [facility]  If 7PM-7AM, please contact night-coverage [email or web address] Password [facility]  03/09/2016, 9:17 AM

## 2016-03-09 NOTE — ED Triage Notes (Addendum)
Pt to ED by EMS c/o R foot numbness and pain onset when waking up. Pain increases with movement. Pt seen PCP this week and had a change in the frequency (takes q 5 times daily instead of TID) of tramadol and gabapentin. EMS also reports pt had foul urine odor. Pt denies any urinary symptoms

## 2016-03-09 NOTE — Progress Notes (Addendum)
*  Preliminary Results* Bilateral lower extremity venous duplex completed. Bilateral lower extremities are negative for deep vein thrombosis. There is evidence of left Baker's cyst. No evidence of right Baker's cyst.  03/09/2016 4:32 PM Maudry Mayhew, BS, RVT, RDCS, RDMS

## 2016-03-09 NOTE — ED Notes (Signed)
Gabapentin frequency increased January 2. Per daughter, pt has been experiencing confusion for the past couple of weeks since change in medication. Pt has one kidney and sees a nephrologist who has been monitoring bun/creat. Daughter and pt are unsure of last kidney function tests.

## 2016-03-09 NOTE — Progress Notes (Signed)
New Admission Note:   Arrival Method: Stretcher  Mental Orientation: A&O X4 Telemetry: Not ordered Assessment: Completed Skin: Clean, Dry, Intact Iv: Clean, Dry, Intact Pain: 5/10 Admission: Completed Unit Orientation: Patient has been orientated to the room, unit and staff.  Family: At bedside  Orders have been reviewed and implemented. Will continue to monitor the patient. Call light has been placed within reach and bed alarm has been activated.    Dixie Dials RN, BSN

## 2016-03-09 NOTE — ED Notes (Signed)
Dr.Wickline updating pt's daughter on plan of care

## 2016-03-10 DIAGNOSIS — N179 Acute kidney failure, unspecified: Secondary | ICD-10-CM | POA: Diagnosis not present

## 2016-03-10 DIAGNOSIS — G4733 Obstructive sleep apnea (adult) (pediatric): Secondary | ICD-10-CM | POA: Diagnosis not present

## 2016-03-10 DIAGNOSIS — R001 Bradycardia, unspecified: Secondary | ICD-10-CM | POA: Diagnosis not present

## 2016-03-10 DIAGNOSIS — I5042 Chronic combined systolic (congestive) and diastolic (congestive) heart failure: Secondary | ICD-10-CM | POA: Diagnosis not present

## 2016-03-10 DIAGNOSIS — Z888 Allergy status to other drugs, medicaments and biological substances status: Secondary | ICD-10-CM | POA: Diagnosis not present

## 2016-03-10 DIAGNOSIS — N289 Disorder of kidney and ureter, unspecified: Secondary | ICD-10-CM | POA: Diagnosis present

## 2016-03-10 DIAGNOSIS — Y92009 Unspecified place in unspecified non-institutional (private) residence as the place of occurrence of the external cause: Secondary | ICD-10-CM | POA: Diagnosis not present

## 2016-03-10 DIAGNOSIS — Z880 Allergy status to penicillin: Secondary | ICD-10-CM | POA: Diagnosis not present

## 2016-03-10 DIAGNOSIS — I130 Hypertensive heart and chronic kidney disease with heart failure and stage 1 through stage 4 chronic kidney disease, or unspecified chronic kidney disease: Secondary | ICD-10-CM | POA: Diagnosis not present

## 2016-03-10 DIAGNOSIS — Z91012 Allergy to eggs: Secondary | ICD-10-CM | POA: Diagnosis not present

## 2016-03-10 DIAGNOSIS — T404X5A Adverse effect of other synthetic narcotics, initial encounter: Secondary | ICD-10-CM | POA: Diagnosis present

## 2016-03-10 DIAGNOSIS — M10071 Idiopathic gout, right ankle and foot: Secondary | ICD-10-CM | POA: Diagnosis present

## 2016-03-10 DIAGNOSIS — E1122 Type 2 diabetes mellitus with diabetic chronic kidney disease: Secondary | ICD-10-CM | POA: Diagnosis not present

## 2016-03-10 DIAGNOSIS — Z794 Long term (current) use of insulin: Secondary | ICD-10-CM | POA: Diagnosis not present

## 2016-03-10 DIAGNOSIS — N189 Chronic kidney disease, unspecified: Secondary | ICD-10-CM | POA: Diagnosis not present

## 2016-03-10 DIAGNOSIS — D72829 Elevated white blood cell count, unspecified: Secondary | ICD-10-CM | POA: Diagnosis present

## 2016-03-10 DIAGNOSIS — R0789 Other chest pain: Secondary | ICD-10-CM | POA: Diagnosis not present

## 2016-03-10 DIAGNOSIS — Z87891 Personal history of nicotine dependence: Secondary | ICD-10-CM | POA: Diagnosis not present

## 2016-03-10 DIAGNOSIS — G92 Toxic encephalopathy: Secondary | ICD-10-CM | POA: Diagnosis not present

## 2016-03-10 DIAGNOSIS — N170 Acute kidney failure with tubular necrosis: Secondary | ICD-10-CM | POA: Diagnosis not present

## 2016-03-10 DIAGNOSIS — L409 Psoriasis, unspecified: Secondary | ICD-10-CM | POA: Diagnosis not present

## 2016-03-10 DIAGNOSIS — E785 Hyperlipidemia, unspecified: Secondary | ICD-10-CM | POA: Diagnosis not present

## 2016-03-10 DIAGNOSIS — M10072 Idiopathic gout, left ankle and foot: Secondary | ICD-10-CM | POA: Diagnosis present

## 2016-03-10 DIAGNOSIS — N183 Chronic kidney disease, stage 3 (moderate): Secondary | ICD-10-CM

## 2016-03-10 DIAGNOSIS — J449 Chronic obstructive pulmonary disease, unspecified: Secondary | ICD-10-CM | POA: Diagnosis not present

## 2016-03-10 DIAGNOSIS — T426X5A Adverse effect of other antiepileptic and sedative-hypnotic drugs, initial encounter: Secondary | ICD-10-CM | POA: Diagnosis present

## 2016-03-10 LAB — GLUCOSE, CAPILLARY
Glucose-Capillary: 227 mg/dL — ABNORMAL HIGH (ref 65–99)
Glucose-Capillary: 398 mg/dL — ABNORMAL HIGH (ref 65–99)
Glucose-Capillary: 347 mg/dL — ABNORMAL HIGH (ref 65–99)
Glucose-Capillary: 330 mg/dL — ABNORMAL HIGH (ref 65–99)

## 2016-03-10 LAB — URINE CULTURE: Culture: NO GROWTH

## 2016-03-10 LAB — BASIC METABOLIC PANEL
Anion gap: 9 (ref 5–15)
Anion gap: 11 (ref 5–15)
BUN: 74 mg/dL — ABNORMAL HIGH (ref 6–20)
BUN: 65 mg/dL — ABNORMAL HIGH (ref 6–20)
CO2: 22 mmol/L (ref 22–32)
CO2: 22 mmol/L (ref 22–32)
Calcium: 8.5 mg/dL — ABNORMAL LOW (ref 8.9–10.3)
Calcium: 8.6 mg/dL — ABNORMAL LOW (ref 8.9–10.3)
Chloride: 105 mmol/L (ref 101–111)
Chloride: 102 mmol/L (ref 101–111)
Creatinine, Ser: 3.94 mg/dL — ABNORMAL HIGH (ref 0.44–1.00)
Creatinine, Ser: 2.71 mg/dL — ABNORMAL HIGH (ref 0.44–1.00)
GFR calc Af Amer: 12 mL/min — ABNORMAL LOW (ref >60)
GFR calc Af Amer: 19 mL/min — ABNORMAL LOW (ref >60)
GFR calc non Af Amer: 10 mL/min — ABNORMAL LOW (ref >60)
GFR calc non Af Amer: 16 mL/min — ABNORMAL LOW (ref >60)
Glucose, Bld: 264 mg/dL — ABNORMAL HIGH (ref 65–99)
Glucose, Bld: 347 mg/dL — ABNORMAL HIGH (ref 65–99)
Potassium: 4.8 mmol/L (ref 3.5–5.1)
Potassium: 4.7 mmol/L (ref 3.5–5.1)
Sodium: 136 mmol/L (ref 135–145)
Sodium: 135 mmol/L (ref 135–145)

## 2016-03-10 LAB — CBC
HCT: 32.4 % — ABNORMAL LOW (ref 36.0–46.0)
Hemoglobin: 9.9 g/dL — ABNORMAL LOW (ref 12.0–15.0)
MCH: 25.4 pg — ABNORMAL LOW (ref 26.0–34.0)
MCHC: 30.6 g/dL (ref 30.0–36.0)
MCV: 83.1 fL (ref 78.0–100.0)
Platelets: 262 10*3/uL (ref 150–400)
RBC: 3.9 MIL/uL (ref 3.87–5.11)
RDW: 15.3 % (ref 11.5–15.5)
WBC: 10.8 10*3/uL — ABNORMAL HIGH (ref 4.0–10.5)

## 2016-03-10 LAB — PHOSPHORUS: Phosphorus: 1.7 mg/dL — ABNORMAL LOW (ref 2.5–4.6)

## 2016-03-10 LAB — TROPONIN I: Troponin I: <0.03 ng/mL (ref <0.03)

## 2016-03-10 LAB — MAGNESIUM: Magnesium: 2.1 mg/dL (ref 1.7–2.4)

## 2016-03-10 MED ORDER — GI COCKTAIL ~~LOC~~
30.0000 mL | Freq: Once | ORAL | Status: AC
  Administered 2016-03-10: 30 mL via ORAL
  Filled 2016-03-10: qty 30

## 2016-03-10 MED ORDER — INSULIN DETEMIR 100 UNIT/ML ~~LOC~~ SOLN
62.0000 [IU] | Freq: Every day | SUBCUTANEOUS | Status: DC
  Administered 2016-03-11: 62 [IU] via SUBCUTANEOUS
  Filled 2016-03-10: qty 0.62

## 2016-03-10 MED ORDER — FUROSEMIDE 40 MG PO TABS
40.0000 mg | ORAL_TABLET | Freq: Every day | ORAL | Status: DC
  Administered 2016-03-11: 40 mg via ORAL
  Filled 2016-03-10: qty 1

## 2016-03-10 MED ORDER — IRBESARTAN 75 MG PO TABS
150.0000 mg | ORAL_TABLET | Freq: Every day | ORAL | Status: DC
  Filled 2016-03-10 (×2): qty 2

## 2016-03-10 NOTE — Progress Notes (Signed)
PROGRESS NOTE    Stephanie Woodward  UQJ:[id number] DOB: [birth year] DOA: 03/09/2016 PCP: Donnie Coffin, MD    Brief Narrative:   76 y/o ? Known obstructive sleep apnea Psoriasis Nonfunctioning left kidney since birth Prior right lower extremity cellulitis Known systolic heart failure-cardiac cath 11/2013 decreased ejection fraction 45-50% Diabetes mellitus type 2 on oral meds and Levemir Hyperlipidemia Hypertension  Admitted with left foot pain starting 03/06/2016-rx this as a skin infection and bilateral lower extremity swelling The patient was given tramadol and Neurontin with PCP  Assessment & Plan:   Active Problems:   Kidney disease, chronic, stage III (GFR 30-59 ml/min)   Hyperlipidemia   Acute on chronic renal failure (HCC)   Diabetes mellitus with complication (HCC)   Foot pain, bilateral   AKI (acute kidney injury) ([place])   Bradycardia   Acute on chronic kidney disease: Congenital non-functioning L kidney since birth. Creatinine 4.4 BUN 79. Baseline Cr 2 and BUN 33. Likely secondary to poor oral intake and recent Bactrim initiation 03/05/16. Followed by [facility] in outpt setting. - IVF - renal consult if not improving.  - Hold lasix and avapro until the am--has cut back dose to 40 mg and 150 mg daily chest half the usual dose  Foot pain: R>L. Mar[place] Kitchen 2+ dorsalis pedis pulses bilat. Fleshy keratotic changes likely unrelated. Xray w/o acute abnormality. Gout vs peripheral neuropathy. Elevated WBC and fever likely unrelated.  - Uric acid level was elevated - consider MRI if not improving per radiology rec - Decrease neurontin and tramadol due to confusion - Venous duplex performed on 1/16 was negative  AMS: intermittent. Currently w/ complete faculties. Likely from increased tramadol and neurontin--> at pain management physician who increased the dose.  - decrease neurontin and tramadol - Monitor chemistries - Ammonia -Urine culture 1/16 showed no  growth  Leukocytosis/fever: pts only focal complaint is intermittent foot pain. Reactive vs viral vs bacterial. UA nml. CXR nml. Lactic acid 1.21 - procalcitonin - ESR, CRP.   Bradycardia: asymptomatic - EKG, Tele, trop x1  HTN: - continue metop - Above discussion regarding Lasix in April  COPD: at baseline - continue dulera  HLD: - continue liipitor  Congestive heart failure: Last echo showing an EF of 45%. Well compensated. - Continue Lasix 80 mg daily--> orally daily - Strict I's and O's, daily weights -Swelling subjectively is diminished frompreviously   DM: - continue levemir 50 units--increased to 62 units - SSI coverage needed -Sliding scale 260-390   DVT prophylaxis: Lovenox Code Status: Full Family Communication: Discussed with daughter at the bedside Disposition Plan: Inpatient potentially can discharge as early as a.m. 1/8   Consultants:   None  Procedures:     Antimicrobials:       Subjective:  LIVES WITH DAUGHTER LIVING WITH DAUGHTER PAIN IN THE l FOOT IPORVED 5/10 YESTERDAY WAS 10/10 GABPENETIN AND TRAMADOL WAS TAKING 3 X A DAY--WAS TAKING 4 X A DAY   Objective: Vitals:   03/09/16 2151 03/10/16 0603 03/10/16 0925 03/10/16 1009  BP: (!) 118/55 (!) 130/41 127/63   Pulse: (!) 108 (!) 122 66   Resp: '18 19 18   ' Temp: 99.4 F (37.4 C) 98.5 F (36.9 C) 98.1 F (36.7 C)   TempSrc: Oral Oral Oral   SpO2: 96% 95% 99% 95%  Weight:      Height:        Intake/Output Summary (Last 24 hours) at 03/10/16 1047 Last data filed at 03/10/16 0926  Gross per 24 hour  Intake             2495 ml  Output             3100 ml  Net             -605 ml   Filed Weights   03/09/16 0508 03/09/16 0849  Weight: 90.7 kg (200 lb) 90.7 kg (200 lb)    Examination:  General exam: Appears calm and comfortable -No apparent distress Keratotic changes to face Respiratory system: Clear to auscultation. Respiratory effort normal. No added  sound Cardiovascular system: S1 & S2 heard, RRR. No JVD, murmurs, rubs, gallops or clicks. ACE lower extremity edema with wrinkling of skin Gastrointestinal system: Abdomen is nondistended, soft and nontender. No organomegaly Central nervous system: Alert and oriented. No focal neurological deficits. Extremities: Symmetric 5 x 5 power.  DaRK l FOOT-WARM TO TOUCH Skin: No rashes, lesions or ulcers Psychiatry: Judgement and insight appear normal. Mood & affect appropriate.     Data Reviewed: I have personally reviewed following labs and imaging studies  CBC:  Recent Labs Lab 03/09/16 0528 03/10/16 0626  WBC 14.7* 10.8*  NEUTROABS 11.7*  --   HGB 11.1* 9.9*  HCT 35.3* 32.4*  MCV 83.3 83.1  PLT 317 938   Basic Metabolic Panel:  Recent Labs Lab 03/09/16 0528 03/09/16 1716 03/10/16 0626  NA 136 136 136  K 4.9 4.2 4.8  CL 102 103 105  CO2 '25 23 22  ' GLUCOSE 201* 225* 264*  BUN 79* 79* 74*  CREATININE 4.42* 4.39* 3.94*  CALCIUM 8.8* 8.3* 8.5*   GFR: Estimated Creatinine Clearance: 13.6 mL/min (by C-G formula based on SCr of 3.94 mg/dL (H)). Liver Function Tests: No results for input(s): AST, ALT, ALKPHOS, BILITOT, PROT, ALBUMIN in the last 168 hours. No results for input(s): LIPASE, AMYLASE in the last 168 hours.  Recent Labs Lab 03/09/16 1223  AMMONIA 24   Coagulation Profile: No results for input(s): INR, PROTIME in the last 168 hours. Cardiac Enzymes: No results for input(s): CKTOTAL, CKMB, CKMBINDEX, TROPONINI in the last 168 hours. BNP (last 3 results) No results for input(s): PROBNP in the last 8760 hours. HbA1C: No results for input(s): HGBA1C in the last 72 hours. CBG:  Recent Labs Lab 03/09/16 1202 03/09/16 1701 03/09/16 2010 03/10/16 0750  GLUCAP 268* 229* 213* 227*   Lipid Profile: No results for input(s): CHOL, HDL, LDLCALC, TRIG, CHOLHDL, LDLDIRECT in the last 72 hours. Thyroid Function Tests: No results for input(s): TSH, T4TOTAL, FREET4,  T3FREE, THYROIDAB in the last 72 hours. Anemia Panel: No results for input(s): VITAMINB12, FOLATE, FERRITIN, TIBC, IRON, RETICCTPCT in the last 72 hours. Sepsis Labs:  Recent Labs Lab 03/09/16 0533 03/09/16 0851  PROCALCITON  --  <0.10  LATICACIDVEN 1.21  --     Recent Results (from the past 240 hour(s))  Urine culture     Status: None   Collection Time: 03/09/16  6:48 AM  Result Value Ref Range Status   Specimen Description URINE, RANDOM  Final   Special Requests NONE  Final   Culture NO GROWTH  Final   Report Status 03/10/2016 FINAL  Final         Radiology Studies: Dg Chest 2 View  Result Date: 03/09/2016 CLINICAL DATA:  Fever. EXAM: CHEST  2 VIEW COMPARISON:  02/05/2014 FINDINGS: Stable cardiomegaly and mediastinal contours. Again seen bronchial thickening and interstitial prominence. No focal airspace disease to suggest pneumonia. No evidence of pleural fluid. No pneumothorax. Osseous  structures are unchanged. IMPRESSION: Stable cardiomegaly. Peribronchial thickening and interstitial prominence, suspicious for pulmonary edema. No pneumonia. Electronically Signed   By: Jeb Levering M.D.   On: 03/09/2016 06:03   Dg Foot Complete Right  Result Date: 03/09/2016 CLINICAL DATA:  Right foot pain.  Fever. EXAM: RIGHT FOOT COMPLETE - 3+ VIEW COMPARISON:  None. FINDINGS: There is no evidence of fracture or dislocation. Mild scattered osteoarthritis. No bony destructive change. Small plantar calcaneal spur and Achilles tendon enthesophyte. Diffuse soft tissue edema is seen. Vague increased densities noted projecting over Kager's fat pad on the lateral view. No soft tissue air or radiopaque foreign body. IMPRESSION: 1. No acute bony abnormality.  Diffuse soft tissue edema. 2. Scattered osteoarthritis. 3. Vague increased densities projecting over Kager's fat pad on the lateral view, nonspecific. MRI characterization could be considered. Electronically Signed   By: Jeb Levering M.D.    On: 03/09/2016 06:07        Scheduled Meds: . atorvastatin  80 mg Oral Daily  . furosemide  80 mg Oral Daily  . gabapentin  100 mg Oral TID  . heparin  5,000 Units Subcutaneous Q8H  . insulin aspart  0-5 Units Subcutaneous QHS  . insulin aspart  0-9 Units Subcutaneous TID WC  . insulin detemir  50 Units Subcutaneous Daily  . irbesartan  300 mg Oral Daily  . latanoprost  1 drop Both Eyes QHS  . metoprolol succinate  25 mg Oral BID  . mometasone-formoterol  2 puff Inhalation BID  . predniSONE  40 mg Oral Q breakfast   Continuous Infusions: . sodium chloride 75 mL/hr at 03/10/16 0033     LOS: 0 days    Time spent: [place], [place], MD [facility] Pager [phone]  If 7PM-7AM, please contact night-coverage [email or web address] Password [facility] 03/10/2016, 10:47 AM

## 2016-03-10 NOTE — Care Management Note (Signed)
Case Management Note  Patient Details  Name: Stephanie Woodward MRN: [id number] Date of Birth: [birth year]  Subjective/Objective:   CM following for progression and d/c planning.                  Action/Plan: 03/10/2016 Pt daughter requesting hospital bed for this pt as she is currently trying to sleep in a reclining chair. She is also requesting a 3:1 commode. These items have been discussed with the doctor and ordered from [facility].   Expected Discharge Date:                  Expected Discharge Plan:  [place]  In-House Referral:  NA  Discharge planning Services  CM Consult  Post Acute Care Choice:  Durable Medical Equipment, Home Health Choice offered to:  Adult Children  DME Arranged:  3-N-1, Hospital bed DME Agency:  [place]:    [place] Agency:     Status of Service:  In process, will continue to follow  If discussed at Long Length of Stay Meetings, dates discussed:    Additional Comments:  Adron Bene, RN 03/10/2016, 2:13 PM

## 2016-03-10 NOTE — Progress Notes (Signed)
GI cocktail given as ordered pressure was relieved before cocktail given.

## 2016-03-10 NOTE — Progress Notes (Addendum)
RN paged because pt was c/o chest pressure radiating to epigastic area. Pt rated 3/10 and stated she had been having this pain on and off all day but didn't tell anyone. 12 lead did not reveal any acute changes. GI cocktail ordered but pain resolved without intervention. Tele placed and noted PVCs. BMP, Mg ordered stat.  Troponins being cycled.  KJKG, NP [facility] Update: No further chest pain. 1st troponin neg. Mg and K normal. Will follow. KJKG, NP [facility]

## 2016-03-10 NOTE — Progress Notes (Addendum)
Patient complains chest pressure heaviness right side chest going down to abdomen.Patient states pain been off and on on today never mentioned to nurse's until til now.Patient also states noticed pressure more after walking back and forth to bathroom.Rates 3-4/10 on pain scale .VSS temp.98.4 HR 68 RR 18 B/P 132/60  100% R/A.Text paged Forrest Moron NP.

## 2016-03-10 NOTE — Progress Notes (Signed)
New orders received and carried out.

## 2016-03-11 LAB — GLUCOSE, CAPILLARY
Glucose-Capillary: 227 mg/dL — ABNORMAL HIGH (ref 65–99)
Glucose-Capillary: 243 mg/dL — ABNORMAL HIGH (ref 65–99)

## 2016-03-11 LAB — TROPONIN I
Troponin I: 0.03 ng/mL (ref <0.03)
Troponin I: 0.03 ng/mL (ref <0.03)

## 2016-03-11 LAB — PROCALCITONIN: Procalcitonin: <0.1 ng/mL

## 2016-03-11 MED ORDER — PREDNISONE 20 MG PO TABS: 40.0000 mg | ORAL_TABLET | Freq: Every day | ORAL | 0 refills | Status: DC

## 2016-03-11 MED ORDER — INSULIN ASPART 100 UNIT/ML ~~LOC~~ SOLN: 12.0000 [IU] | Freq: Three times a day (TID) | SUBCUTANEOUS | 11 refills | Status: DC

## 2016-03-11 MED ORDER — FUROSEMIDE 40 MG PO TABS: 20.0000 mg | ORAL_TABLET | Freq: Two times a day (BID) | ORAL | 12 refills | Status: DC

## 2016-03-11 MED ORDER — INSULIN DETEMIR 100 UNIT/ML ~~LOC~~ SOLN: 62.0000 [IU] | Freq: Every day | SUBCUTANEOUS | 11 refills | Status: DC

## 2016-03-11 MED ORDER — TELMISARTAN 40 MG PO TABS: 80.0000 mg | ORAL_TABLET | Freq: Every day | ORAL | 0 refills | Status: DC

## 2016-03-11 NOTE — Progress Notes (Signed)
Jacki Cones to be D/C'd home per MD order.  Discussed prescriptions and follow up appointments with the patient. Prescriptions given to patient, medication list explained in detail. Pt verbalized understanding.  Allergies as of 03/11/2016      Reactions   Eggs Or Egg-derived Products Nausea And Vomiting   Lisinopril    Penicillins Nausea And Vomiting      Medication List    STOP taking these medications   diphenhydrAMINE 25 mg capsule Commonly known as:  BENADRYL   diphenhydrAMINE-zinc acetate cream Commonly known as:  BENADRYL   gabapentin 100 MG capsule Commonly known as:  NEURONTIN   sulfamethoxazole-trimethoprim 800-160 MG tablet Commonly known as:  BACTRIM DS,SEPTRA DS     TAKE these medications   albuterol 108 (90 Base) MCG/ACT inhaler Commonly known as:  PROVENTIL HFA;VENTOLIN HFA Inhale 2 puffs into the lungs every 4 (four) hours as needed for shortness of breath.   atorvastatin 80 MG tablet Commonly known as:  LIPITOR Take 80 mg by mouth daily.   Fluticasone-Salmeterol 250-50 MCG/DOSE Aepb Commonly known as:  ADVAIR Inhale 1 puff into the lungs 2 (two) times daily.   furosemide 40 MG tablet Commonly known as:  LASIX Take 0.5 tablets (20 mg total) by mouth 2 (two) times daily. What changed:  how much to take  Another medication with the same name was removed. Continue taking this medication, and follow the directions you see here.   insulin aspart 100 UNIT/ML injection Commonly known as:  novoLOG Inject 12 Units into the skin 3 (three) times daily before meals. If sugar is over 200 What changed:  how much to take   insulin detemir 100 UNIT/ML injection Commonly known as:  LEVEMIR Inject 0.62 mLs (62 Units total) into the skin daily. What changed:  how much to take   latanoprost 0.005 % ophthalmic solution Commonly known as:  XALATAN Place 1 drop into both eyes at bedtime.   linagliptin 5 MG Tabs tablet Commonly known as:  TRADJENTA Take 5 mg by  mouth daily.   metoprolol succinate 25 MG 24 hr tablet Commonly known as:  TOPROL-XL Take 25 mg by mouth 2 (two) times daily.   predniSONE 20 MG tablet Commonly known as:  DELTASONE Take 2 tablets (40 mg total) by mouth daily with breakfast. Start taking on:  03/12/2016   telmisartan 40 MG tablet Commonly known as:  MICARDIS Take 2 tablets (80 mg total) by mouth daily. What changed:  medication strength   traMADol 50 MG tablet Commonly known as:  ULTRAM Take 50 mg by mouth 2 (two) times daily.            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   The above medical condition requires: Patient requires the ability to reposition frequently   Head  must be elevated greater than: 30 degrees   Bed type Semi-electric   Support Surface: Alternating Pressure Pad and Pump      03/10/16 1407      Vitals:   03/11/16 0451 03/11/16 0952  BP: (!) 121/49 (!) 133/44  Pulse: (!) 49 (!) 48  Resp: 19 18  Temp: 97.7 F (36.5 C) 97.5 F (36.4 C)    Skin clean, dry and intact without evidence of skin break down, no evidence of skin tears noted. IV catheter discontinued intact. Site without signs and symptoms of complications. Dressing and pressure applied. Pt denies pain at this time. No complaints noted.  An After Visit Summary was printed and given to the patient. Patient escorted via [place], and D/C home via private auto.  Dixie Dials RN, BSN

## 2016-03-11 NOTE — Progress Notes (Signed)
Troponin results 0.03 no further complaints of chest pressure or discomfort.Text paged Forrest Moron NP.

## 2016-03-11 NOTE — Progress Notes (Signed)
Patients B/P is 133/44 and HR 48. MD notified. Ordered to hold Avapro and give metoprolol. Re-check in 30 minutes. Will continue to monitor.

## 2016-03-11 NOTE — Discharge Summary (Addendum)
Physician Discharge Summary  Stephanie Woodward MRN:[id number] DOB: 04/05/1940 DOA: 03/09/2016  PCP: Dean Mitchell, MD  Admit date: 03/09/2016 Discharge date: 03/11/2016  Admitted From: Home Disposition:  Home  Recommendations for Outpatient Follow-up:  1. Follow up with PCP in 1-2 weeks 2. Please obtain BMP/CBC in one week 3. Please note changes in the doses of Lasix as well as micardis.  Would recommend that you had labs done prior to going back to regular doses 4. Patient has been counseled regarding getting TED hose and stocking 5. She will need to monitor blood sugars and has some confusion regarding short and long-acting insulin like did some education for her is admission on this 6. If her pain does not resolve she may benefit from an MRI of the right foot but there were no clinical fractures noted and her pain was improving on prednisone 7. The patient should also get home health  8. If her kidney function does not resolve, she should be referred to Dr. Goldsborough for further follow-up   Home Health: YES Equipment/Devices: 3 in 1 as well as rolling walker  Discharge Condition: Stable  CODE STATUS:Full CODE STATUS   Diet recommendation: Heart Healthy / Carb Modified  Brief/Interim Summary: 76 y/o ? Known obstructive sleep apnea Psoriasis Nonfunctioning left kidney since birth Prior right lower extremity cellulitis Known systolic heart failure-cardiac cath 11/2013 decreased ejection fraction 45-50% Diabetes mellitus type 2 on oral meds and Levemir Hyperlipidemia Hypertension  Admitted with left foot pain starting 03/06/2016-rx this as a skin infection and bilateral lower extremity swelling The patient was given tramadol and Neurontin with PCP  Discharge Diagnoses:  Active Problems:   Kidney disease, chronic, stage III (GFR 30-59 ml/min)   Hyperlipidemia   Acute on chronic renal failure (HCC)   Diabetes mellitus with complication (HCC)   Foot pain, bilateral   AKI  (acute kidney injury) (HCC)   Bradycardia  76 y/o ? Known obstructive sleep apnea Psoriasis Nonfunctioning left kidney since birth Prior right lower extremity cellulitis Known systolic heart failure-cardiac cath 11/2013 decreased ejection fraction 45-50% Diabetes mellitus type 2 on oral meds and Levemir Hyperlipidemia Hypertension  Admitted with left foot pain starting 03/06/2016-rx this as a skin infection and bilateral lower extremity swelling The patient was given tramadol and Neurontin with PCP  Acute on chronic kidney disease: Congenital non-functioning L kidney since birth. Creatinine 4.4 BUN 79. Baseline Cr 2and BUN 33. Likely secondary to poor oral intake and recent Bactrim initiation 03/05/16. Followed by [facility] in outpt setting. Initially her meds including were held lasix and avapro until the am--has cut back dose to 40 mg and Avapro150 mg to half the usual dose - patient was felt to stabilized to some extent and she was sent home on lower doses of these medications on discharge with instructions to follow-up closely with primary care physician, had lab work and then increase Lasix and other medications  Foot pain: R>L. . 2+ dorsalis pedis pulses bilat. Fleshy keratotic changes likely unrelated. Xray w/o acute abnormality.  Idiopathic gout  Elevated WBC and fever likely unrelated.  - Uric acid level was elevated - consider MRI if not improving as an outpatient - Patient follows with pain physician Dr. Crisp I discontinued on this admission her neurontin  -Continue tramadol - Venous duplex performed on 1/16 was negative  AMS-toxic metabolic encephalopathy secondary to overmedication: intermittent. Currently w/ complete faculties. Likely from increased tramadol and neurontin--> at pain management physician who increased the dose.  - decrease neurontin   and tramadol - Monitor chemistries - Ammonia -Urine culture 1/16 showed no growth  Leukocytosis/fever: pts  only focal complaint is intermittent foot pain. Reactive vs viral vs bacterial. UA nml. CXR nml. Lactic acid 1.21 - procalcitonin - ESR, CRP.   Bradycardia: asymptomatic - EKG, Tele, trop x1  HTN: - continue metop - Above discussion regarding Lasix   COPD: at baseline - continue dulera  HLD: - continue liipitor  Compensated chronic diastolic Congestive heart failure: Last echo showing an EF of 45%. Well compensated. - Continue Lasix 80 mg daily--> orally daily cut back dose on discharge 40 mg - Strict I's and O's, daily weights -Swelling subjectively is diminished frompreviously   Transient chest pain Patient has some chest pressure overnight 1/17 the self resolved without any issues troponins were negative Outpatient follow-up  DM: - continue levemir 50 units--increased to 62 units - SSI coverage needed -Sliding scale 260-390 -Long explanation as an inpatient regarding short acting insulin and how it worked.  Patient should take higher doses of insulin until she can be followed as an out agent  Discharge Instructions  Discharge Instructions    Diet - low sodium heart healthy    Complete by:  As directed    Discharge instructions    Complete by:  As directed    On this admission we have cut back her dose of micardis as well as Lasix--you will feel like her feet are swollen and you might feel like stomach is swollen  before however increasing to your usual dose of Lasix as well as regular dose of micardis please make sure that you get a Chem-7 I would recommend one week supply of prednisone for your foot pain You can use Tylenol for first choice for pain and then tramadol for worsening pain but I would not use any further gabapentin Please make sure that you take Levemir once daily at a higher dose and I have increased your short acting insulin which is 3 times a day with meals and check her sugars carefully. Left your physician know if her blood sugars go above  400 If there is no resolution tear pain in your foot as discussed to might need an MRI done as an out agent as you're having resolution I think he will be fine I do not think her chest pain was anything to worry about from my perspective   Increase activity slowly    Complete by:  As directed      Allergies as of 03/11/2016      Reactions   Eggs Or Egg-derived Products Nausea And Vomiting   Lisinopril    Penicillins Nausea And Vomiting      Medication List    STOP taking these medications   diphenhydrAMINE 25 mg capsule Commonly known as:  BENADRYL   diphenhydrAMINE-zinc acetate cream Commonly known as:  BENADRYL   gabapentin 100 MG capsule Commonly known as:  NEURONTIN   sulfamethoxazole-trimethoprim 800-160 MG tablet Commonly known as:  BACTRIM DS,SEPTRA DS     TAKE these medications   albuterol 108 (90 Base) MCG/ACT inhaler Commonly known as:  PROVENTIL HFA;VENTOLIN HFA Inhale 2 puffs into the lungs every 4 (four) hours as needed for shortness of breath.   atorvastatin 80 MG tablet Commonly known as:  LIPITOR Take 80 mg by mouth daily.   Fluticasone-Salmeterol 250-50 MCG/DOSE Aepb Commonly known as:  ADVAIR Inhale 1 puff into the lungs 2 (two) times daily.   furosemide 40 MG tablet Commonly known as:  LASIX Take 0.5 tablets (20 mg total) by mouth 2 (two) times daily. What changed:  how much to take  Another medication with the same name was removed. Continue taking this medication, and follow the directions you see here.   insulin aspart 100 UNIT/ML injection Commonly known as:  novoLOG Inject 12 Units into the skin 3 (three) times daily before meals. If sugar is over 200 What changed:  how much to take   insulin detemir 100 UNIT/ML injection Commonly known as:  LEVEMIR Inject 0.62 mLs (62 Units total) into the skin daily. What changed:  how much to take   latanoprost 0.005 % ophthalmic solution Commonly known as:  XALATAN Place 1 drop into both eyes  at bedtime.   linagliptin 5 MG Tabs tablet Commonly known as:  TRADJENTA Take 5 mg by mouth daily.   metoprolol succinate 25 MG 24 hr tablet Commonly known as:  TOPROL-XL Take 25 mg by mouth 2 (two) times daily.   predniSONE 20 MG tablet Commonly known as:  DELTASONE Take 2 tablets (40 mg total) by mouth daily with breakfast. Start taking on:  03/12/2016   telmisartan 40 MG tablet Commonly known as:  MICARDIS Take 2 tablets (80 mg total) by mouth daily. What changed:  medication strength   traMADol 50 MG tablet Commonly known as:  ULTRAM Take 50 mg by mouth 2 (two) times daily.            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   The above medical condition requires: Patient requires the ability to reposition frequently   Head must be elevated greater than: 30 degrees   Bed type Semi-electric   Support Surface: Alternating Pressure Pad and Pump      03/10/16 1407      Allergies  Allergen Reactions  . Eggs Or Egg-Derived Products Nausea And Vomiting  . Lisinopril   . Penicillins Nausea And Vomiting    Consultations: *None  Procedures/Studies: Dg Chest 2 View  Result Date: 03/09/2016 CLINICAL DATA:  Fever. EXAM: CHEST  2 VIEW COMPARISON:  02/05/2014 FINDINGS: Stable cardiomegaly and mediastinal contours. Again seen bronchial thickening and interstitial prominence. No focal airspace disease to suggest pneumonia. No evidence of pleural fluid. No pneumothorax. Osseous  structures are unchanged. IMPRESSION: Stable cardiomegaly. Peribronchial thickening and interstitial prominence, suspicious for pulmonary edema. No pneumonia. Electronically Signed   By: Jeb Levering M.D.   On: 03/09/2016 06:03   Dg Foot Complete Right  Result Date: 03/09/2016 CLINICAL DATA:  Right foot pain.  Fever. EXAM: RIGHT FOOT COMPLETE - 3+ VIEW COMPARISON:  None. FINDINGS: There is no evidence of fracture or dislocation. Mild scattered osteoarthritis. No bony destructive change. Small plantar calcaneal spur and Achilles tendon enthesophyte. Diffuse soft tissue edema is seen. Vague increased densities noted projecting  over Kager's fat pad on the lateral view. No soft tissue air or radiopaque foreign body. IMPRESSION: 1. No acute bony abnormality.  Diffuse soft tissue edema. 2. Scattered osteoarthritis. 3. Vague increased densities projecting over Kager's fat pad on the lateral view, nonspecific. MRI characterization could be considered. Electronically Signed   By: Jeb Levering M.D.   On: 03/09/2016 06:07    (Echo, Carotid, EGD, Colonoscopy, ERCP)    Subjective:   Discharge Exam: Vitals:   03/11/16 0451 03/11/16 0952  BP: (!) 121/49 (!) 133/44  Pulse: (!) 49 (!) 48  Resp: 19 18  Temp: 97.7 F (36.5 C) 97.5 F (36.4 C)   Vitals:   03/10/16 2046 03/11/16 0451 03/11/16 0925 03/11/16 0952  BP: 132/60 (!) 121/49  (!) 133/44  Pulse: 68 (!) 49  (!) 48  Resp: _0 Temp: 98.4 F (36.9 C) 97.7 F (36.5 C)  97.5 F (36.4 C)  TempSrc: Oral Oral  Oral  SpO2: 99% 100% 98% 99%  Weight: 90.3 kg (199 lb)     Height:        General: Pt is alert, awake, not in acute distress--states the pain is improved  Cardiovascular: RRR, S1/S2 +, no rubs, no gallops Respiratory: CTA bilaterally, no wheezing, no rhonchi Abdominal: Soft, NT, ND, bowel sounds + Extremities: no edema, no cyanosis    The results of significant diagnostics from this hospitalization (including imaging,  microbiology, ancillary and laboratory) are listed below for reference.     Microbiology: Recent Results (from the past 240 hour(s))  Urine culture     Status: None   Collection Time: 03/09/16  6:48 AM  Result Value Ref Range Status   Specimen Description URINE, RANDOM  Final   Special Requests NONE  Final   Culture NO GROWTH  Final   Report Status 03/10/2016 FINAL  Final     Labs: BNP (last 3 results) No results for input(s): BNP in the last 8760 hours. Basic Metabolic Panel:  Recent Labs Lab 03/09/16 0528 03/09/16 1716 03/10/16 0626 03/10/16 2137  NA 136 136 136 135  K 4.9 4.2 4.8 4.7  CL 102 103 105 102  CO2 _1 GLUCOSE 201* 225* 264* 347*  BUN 79* 79* 74* 65*  CREATININE 4.42* 4.39* 3.94* 2.71*  CALCIUM 8.8* 8.3* 8.5* 8.6*  MG  --   --   --  2.1  PHOS  --   --   --  1.7*   Liver Function Tests: No results for input(s): AST, ALT, ALKPHOS, BILITOT, PROT, ALBUMIN in the last 168 hours. No results for input(s): LIPASE, AMYLASE in the last 168 hours.  Recent Labs Lab 03/09/16 1223  AMMONIA 24   CBC:  Recent Labs Lab 03/09/16 0528 03/10/16 0626  WBC 14.7* 10.8*  NEUTROABS 11.7*  --   HGB 11.1* 9.9*  HCT 35.3* 32.4*  MCV 83.3 83.1  PLT 317 262   Cardiac Enzymes:  Recent Labs Lab 03/10/16 2137 03/11/16 0253 03/11/16 0857  TROPONINI <0.03 0.03* 0.03*   BNP: Invalid input(s): POCBNP CBG:  Recent Labs Lab 03/10/16 0750 03/10/16 1156 03/10/16 1706 03/10/16 2046 03/11/16 0756  GLUCAP 227* 398* 347* 330* 227*   D-Dimer No results for input(s): DDIMER in the last 72 hours. Hgb A1c No results for input(s): HGBA1C in the last 72 hours. Lipid Profile No results for input(s): CHOL, HDL, LDLCALC, TRIG, CHOLHDL, LDLDIRECT in the last 72 hours. Thyroid function studies No results for input(s):  TSH, T4TOTAL, T3FREE, THYROIDAB in the last 72 hours.  Invalid input(s): FREET3 Anemia work up No results for input(s): VITAMINB12, FOLATE,  FERRITIN, TIBC, IRON, RETICCTPCT in the last 72 hours. Urinalysis    Component Value Date/Time   COLORURINE STRAW (A) 03/09/2016 0648   APPEARANCEUR CLEAR 03/09/2016 0648   LABSPEC 1.009 03/09/2016 0648   PHURINE 5.0 03/09/2016 0648   GLUCOSEU NEGATIVE 03/09/2016 0648   HGBUR NEGATIVE 03/09/2016 0648   BILIRUBINUR NEGATIVE 03/09/2016 0648   KETONESUR NEGATIVE 03/09/2016 0648   PROTEINUR 30 (A) 03/09/2016 0648   UROBILINOGEN 0.2 12/05/2013 1657   NITRITE NEGATIVE 03/09/2016 0648   LEUKOCYTESUR TRACE (A) 03/09/2016 0648   Sepsis Labs Invalid input(s): PROCALCITONIN,  WBC,  LACTICIDVEN Microbiology Recent Results (from the past 240 hour(s))  Urine culture     Status: None   Collection Time: 03/09/16  6:48 AM  Result Value Ref Range Status   Specimen Description URINE, RANDOM  Final   Special Requests NONE  Final   Culture NO GROWTH  Final   Report Status 03/10/2016 FINAL  Final     Time coordinating discharge: Over 30 minutes  SIGNED:   Nita Sells, MD  [facility] 03/11/2016, 10:16 AM Pager   If 7PM-7AM, please contact night-coverage [email or web address] Password [email or web address]

## 2016-03-12 DIAGNOSIS — N189 Chronic kidney disease, unspecified: Secondary | ICD-10-CM | POA: Diagnosis not present

## 2016-03-12 DIAGNOSIS — J449 Chronic obstructive pulmonary disease, unspecified: Secondary | ICD-10-CM | POA: Diagnosis not present

## 2016-03-12 DIAGNOSIS — R001 Bradycardia, unspecified: Secondary | ICD-10-CM | POA: Diagnosis not present

## 2016-03-12 LAB — HEMOGLOBIN A1C
Hgb A1c MFr Bld: 8.8 % — ABNORMAL HIGH (ref 4.8–5.6)
Mean Plasma Glucose: 206 mg/dL

## 2016-03-22 DIAGNOSIS — M5416 Radiculopathy, lumbar region: Secondary | ICD-10-CM | POA: Diagnosis not present

## 2016-03-22 DIAGNOSIS — M533 Sacrococcygeal disorders, not elsewhere classified: Secondary | ICD-10-CM | POA: Diagnosis not present

## 2016-03-22 DIAGNOSIS — M47817 Spondylosis without myelopathy or radiculopathy, lumbosacral region: Secondary | ICD-10-CM | POA: Diagnosis not present

## 2016-03-22 DIAGNOSIS — M791 Myalgia: Secondary | ICD-10-CM | POA: Diagnosis not present

## 2016-03-25 DIAGNOSIS — N183 Chronic kidney disease, stage 3 (moderate): Secondary | ICD-10-CM | POA: Diagnosis not present

## 2016-03-25 DIAGNOSIS — I10 Essential (primary) hypertension: Secondary | ICD-10-CM | POA: Diagnosis not present

## 2016-03-25 DIAGNOSIS — R609 Edema, unspecified: Secondary | ICD-10-CM | POA: Diagnosis not present

## 2016-03-25 DIAGNOSIS — E1121 Type 2 diabetes mellitus with diabetic nephropathy: Secondary | ICD-10-CM | POA: Diagnosis not present

## 2016-03-25 DIAGNOSIS — Z794 Long term (current) use of insulin: Secondary | ICD-10-CM | POA: Diagnosis not present

## 2016-04-03 DIAGNOSIS — I872 Venous insufficiency (chronic) (peripheral): Secondary | ICD-10-CM | POA: Diagnosis not present

## 2016-04-03 DIAGNOSIS — D72829 Elevated white blood cell count, unspecified: Secondary | ICD-10-CM | POA: Diagnosis not present

## 2016-04-03 DIAGNOSIS — R600 Localized edema: Secondary | ICD-10-CM | POA: Diagnosis not present

## 2016-04-04 ENCOUNTER — Emergency Department (HOSPITAL_COMMUNITY): Payer: PPO

## 2016-04-04 ENCOUNTER — Encounter (HOSPITAL_COMMUNITY): Payer: Self-pay | Admitting: Emergency Medicine

## 2016-04-04 ENCOUNTER — Observation Stay (HOSPITAL_COMMUNITY)
Admission: EM | Admit: 2016-04-04 | Discharge: 2016-04-06 | Disposition: A | Discharge status: Home / Self Care | Payer: PPO | Attending: Internal Medicine | Admitting: Internal Medicine

## 2016-04-04 DIAGNOSIS — E785 Hyperlipidemia, unspecified: Secondary | ICD-10-CM | POA: Diagnosis not present

## 2016-04-04 DIAGNOSIS — N183 Chronic kidney disease, stage 3 unspecified: Secondary | ICD-10-CM | POA: Diagnosis present

## 2016-04-04 DIAGNOSIS — Z880 Allergy status to penicillin: Secondary | ICD-10-CM | POA: Insufficient documentation

## 2016-04-04 DIAGNOSIS — E669 Obesity, unspecified: Secondary | ICD-10-CM | POA: Diagnosis not present

## 2016-04-04 DIAGNOSIS — Z7951 Long term (current) use of inhaled steroids: Secondary | ICD-10-CM | POA: Insufficient documentation

## 2016-04-04 DIAGNOSIS — R079 Chest pain, unspecified: Secondary | ICD-10-CM | POA: Diagnosis present

## 2016-04-04 DIAGNOSIS — R0789 Other chest pain: Principal | ICD-10-CM | POA: Insufficient documentation

## 2016-04-04 DIAGNOSIS — G4733 Obstructive sleep apnea (adult) (pediatric): Secondary | ICD-10-CM | POA: Diagnosis not present

## 2016-04-04 DIAGNOSIS — Z87891 Personal history of nicotine dependence: Secondary | ICD-10-CM | POA: Insufficient documentation

## 2016-04-04 DIAGNOSIS — Z794 Long term (current) use of insulin: Secondary | ICD-10-CM | POA: Insufficient documentation

## 2016-04-04 DIAGNOSIS — J449 Chronic obstructive pulmonary disease, unspecified: Secondary | ICD-10-CM | POA: Insufficient documentation

## 2016-04-04 DIAGNOSIS — I10 Essential (primary) hypertension: Secondary | ICD-10-CM | POA: Diagnosis present

## 2016-04-04 DIAGNOSIS — R072 Precordial pain: Secondary | ICD-10-CM | POA: Diagnosis not present

## 2016-04-04 DIAGNOSIS — I129 Hypertensive chronic kidney disease with stage 1 through stage 4 chronic kidney disease, or unspecified chronic kidney disease: Secondary | ICD-10-CM | POA: Diagnosis not present

## 2016-04-04 DIAGNOSIS — E1122 Type 2 diabetes mellitus with diabetic chronic kidney disease: Secondary | ICD-10-CM | POA: Diagnosis not present

## 2016-04-04 DIAGNOSIS — N186 End stage renal disease: Secondary | ICD-10-CM | POA: Insufficient documentation

## 2016-04-04 DIAGNOSIS — I5033 Acute on chronic diastolic (congestive) heart failure: Secondary | ICD-10-CM | POA: Diagnosis not present

## 2016-04-04 DIAGNOSIS — Z6836 Body mass index (BMI) 36.0-36.9, adult: Secondary | ICD-10-CM | POA: Diagnosis not present

## 2016-04-04 DIAGNOSIS — Z79899 Other long term (current) drug therapy: Secondary | ICD-10-CM | POA: Diagnosis not present

## 2016-04-04 DIAGNOSIS — M1990 Unspecified osteoarthritis, unspecified site: Secondary | ICD-10-CM | POA: Insufficient documentation

## 2016-04-04 DIAGNOSIS — E11649 Type 2 diabetes mellitus with hypoglycemia without coma: Secondary | ICD-10-CM

## 2016-04-04 LAB — CBC
HCT: 33.6 % — ABNORMAL LOW (ref 36.0–46.0)
Hemoglobin: 10.4 g/dL — ABNORMAL LOW (ref 12.0–15.0)
MCH: 26.3 pg (ref 26.0–34.0)
MCHC: 31 g/dL (ref 30.0–36.0)
MCV: 84.8 fL (ref 78.0–100.0)
Platelets: 266 10*3/uL (ref 150–400)
RBC: 3.96 MIL/uL (ref 3.87–5.11)
RDW: 15.8 % — ABNORMAL HIGH (ref 11.5–15.5)
WBC: 10.4 10*3/uL (ref 4.0–10.5)

## 2016-04-04 LAB — TROPONIN I
Troponin I: 0.06 ng/mL (ref <0.03)
Troponin I: 0.05 ng/mL (ref <0.03)

## 2016-04-04 LAB — BASIC METABOLIC PANEL
Anion gap: 5 (ref 5–15)
BUN: 22 mg/dL — ABNORMAL HIGH (ref 6–20)
CO2: 28 mmol/L (ref 22–32)
Calcium: 8.7 mg/dL — ABNORMAL LOW (ref 8.9–10.3)
Chloride: 109 mmol/L (ref 101–111)
Creatinine, Ser: 2 mg/dL — ABNORMAL HIGH (ref 0.44–1.00)
GFR calc Af Amer: 27 mL/min — ABNORMAL LOW (ref >60)
GFR calc non Af Amer: 24 mL/min — ABNORMAL LOW (ref >60)
Glucose, Bld: 198 mg/dL — ABNORMAL HIGH (ref 65–99)
Potassium: 4.2 mmol/L (ref 3.5–5.1)
Sodium: 142 mmol/L (ref 135–145)

## 2016-04-04 LAB — GLUCOSE, CAPILLARY
Glucose-Capillary: 217 mg/dL — ABNORMAL HIGH (ref 65–99)
Glucose-Capillary: 222 mg/dL — ABNORMAL HIGH (ref 65–99)

## 2016-04-04 LAB — I-STAT TROPONIN, ED: Troponin i, poc: 0.04 ng/mL (ref 0.00–0.08)

## 2016-04-04 LAB — BRAIN NATRIURETIC PEPTIDE: B Natriuretic Peptide: 407.5 pg/mL — ABNORMAL HIGH (ref 0.0–100.0)

## 2016-04-04 MED ORDER — INSULIN ASPART 100 UNIT/ML ~~LOC~~ SOLN
10.0000 [IU] | Freq: Three times a day (TID) | SUBCUTANEOUS | Status: DC
  Administered 2016-04-05: 10 [IU] via SUBCUTANEOUS

## 2016-04-04 MED ORDER — METOPROLOL TARTRATE 25 MG PO TABS
25.0000 mg | ORAL_TABLET | Freq: Two times a day (BID) | ORAL | Status: DC
  Administered 2016-04-04 – 2016-04-06 (×4): 25 mg via ORAL
  Filled 2016-04-04 (×4): qty 1

## 2016-04-04 MED ORDER — IRBESARTAN 150 MG PO TABS
150.0000 mg | ORAL_TABLET | Freq: Every day | ORAL | Status: DC
  Administered 2016-04-04 – 2016-04-06 (×3): 150 mg via ORAL
  Filled 2016-04-04 (×3): qty 1

## 2016-04-04 MED ORDER — LATANOPROST 0.005 % OP SOLN
1.0000 [drp] | Freq: Every day | OPHTHALMIC | Status: DC
  Administered 2016-04-04 – 2016-04-05 (×2): 1 [drp] via OPHTHALMIC
  Filled 2016-04-04: qty 2.5

## 2016-04-04 MED ORDER — GI COCKTAIL ~~LOC~~: 30.0000 mL | Freq: Four times a day (QID) | ORAL | Status: DC | PRN

## 2016-04-04 MED ORDER — ASPIRIN EC 325 MG PO TBEC
325.0000 mg | DELAYED_RELEASE_TABLET | Freq: Every day | ORAL | Status: DC
  Administered 2016-04-05 – 2016-04-06 (×2): 325 mg via ORAL
  Filled 2016-04-04 (×2): qty 1

## 2016-04-04 MED ORDER — INSULIN DETEMIR 100 UNIT/ML ~~LOC~~ SOLN
30.0000 [IU] | Freq: Every day | SUBCUTANEOUS | Status: DC
  Administered 2016-04-04: 10 [IU] via SUBCUTANEOUS
  Filled 2016-04-04 (×2): qty 0.3

## 2016-04-04 MED ORDER — FUROSEMIDE 10 MG/ML IJ SOLN
80.0000 mg | Freq: Once | INTRAMUSCULAR | Status: AC
  Administered 2016-04-04: 80 mg via INTRAVENOUS
  Filled 2016-04-04: qty 8

## 2016-04-04 MED ORDER — TRIAMCINOLONE ACETONIDE 0.1 % EX CREA
1.0000 "application " | TOPICAL_CREAM | Freq: Two times a day (BID) | CUTANEOUS | Status: DC
  Administered 2016-04-04 – 2016-04-06 (×4): 1 via TOPICAL
  Filled 2016-04-04 (×2): qty 15

## 2016-04-04 MED ORDER — ATORVASTATIN CALCIUM 80 MG PO TABS
80.0000 mg | ORAL_TABLET | Freq: Every day | ORAL | Status: DC
  Administered 2016-04-05: 80 mg via ORAL
  Filled 2016-04-04: qty 1

## 2016-04-04 MED ORDER — ONDANSETRON HCL 4 MG/2ML IJ SOLN: 4.0000 mg | Freq: Four times a day (QID) | INTRAMUSCULAR | Status: DC | PRN

## 2016-04-04 MED ORDER — MORPHINE SULFATE (PF) 4 MG/ML IV SOLN
2.0000 mg | INTRAVENOUS | Status: DC | PRN
Start: 2016-04-04 — End: 2016-04-05

## 2016-04-04 MED ORDER — ALBUTEROL SULFATE (2.5 MG/3ML) 0.083% IN NEBU: 2.5000 mg | INHALATION_SOLUTION | RESPIRATORY_TRACT | Status: DC | PRN

## 2016-04-04 MED ORDER — ACETAMINOPHEN 325 MG PO TABS
650.0000 mg | ORAL_TABLET | ORAL | Status: DC | PRN
  Administered 2016-04-05: 650 mg via ORAL
  Filled 2016-04-04: qty 2

## 2016-04-04 MED ORDER — TRAMADOL HCL 50 MG PO TABS
50.0000 mg | ORAL_TABLET | Freq: Two times a day (BID) | ORAL | Status: DC
  Administered 2016-04-04 – 2016-04-06 (×4): 50 mg via ORAL
  Filled 2016-04-04 (×4): qty 1

## 2016-04-04 MED ORDER — DIPHENHYDRAMINE HCL 25 MG PO CAPS
25.0000 mg | ORAL_CAPSULE | ORAL | Status: DC | PRN
  Administered 2016-04-04 – 2016-04-05 (×3): 25 mg via ORAL
  Filled 2016-04-04 (×3): qty 1

## 2016-04-04 MED ORDER — HEPARIN SODIUM (PORCINE) 5000 UNIT/ML IJ SOLN
5000.0000 [IU] | Freq: Three times a day (TID) | INTRAMUSCULAR | Status: DC
  Administered 2016-04-04 – 2016-04-06 (×5): 5000 [IU] via SUBCUTANEOUS
  Filled 2016-04-04 (×5): qty 1

## 2016-04-04 MED ORDER — INSULIN ASPART 100 UNIT/ML ~~LOC~~ SOLN
0.0000 [IU] | Freq: Three times a day (TID) | SUBCUTANEOUS | Status: DC
  Administered 2016-04-05 (×2): 3 [IU] via SUBCUTANEOUS
  Administered 2016-04-06: 8 [IU] via SUBCUTANEOUS

## 2016-04-04 NOTE — H&P (Signed)
History and Physical    Stephanie Woodward FWY:[id number] DOB: December 26, 1940 DOA: 04/04/2016  PCP: Donnie Coffin, MD Patient coming from: home  Chief Complaint: chest  Pain and SOB HPI: Stephanie Woodward is a 76 y.o. female with medical history significant of CHF, chronic end-stage kidney disease, diabetes, HTN,, COPD, toxic metabolic encephalopathy with hospitalization in January 2018 who presented to the emergency room via EMS with complaints of shortness of breath. Onset of symptoms this morning and the pain awakened him from sleep. She describes the pain as pressure located substernally in the middle of the chest and upper epigastric area. The initial episode of pain lasted for about 35-40 minutes and a few hours later she developed another episode of 10-20 minutes.She called EMS and upon their arrival was pain free, was given She also experienced some shortness of breath and severe orthopnea when attempted to lay flat during transportation to the ED  that improved with supplemental oxygen. While in the ED patient was pain free as well. She also c/o lower extremities swelling R?L and apin in the popliteal and medial aspect of the right leg Patient had coronary angiography in 2015 but reportedly didn't show obstructive coronary disease.  Studies echocardiogram thousand 15 showed EF of 45-50%, moderate LVH and mild MR   ED Course: VS were stable on presentation except mild temp elevation - 99.67F, blood work revealed mild anemia with hemoglobin 10.4 g/dL, hematocrit 33.6%, elevated BUN of 22 and creatinine creatinine 2.0, which is her baseline  Troponin was mildly elevated at 0.04, BNP 407.5 EKG didn't reveal any acute ST-T wave changes. Chest XRay showed CM with venous congestion  Review of Systems: As per HPI otherwise 10 point review of systems negative.   Ambulatory Status: walks indepenently  Past Medical History:  Diagnosis Date  . Arthritis   . Asthma   . CHF (congestive heart failure)  ([place])   . CKD (chronic kidney disease), stage III   . COPD (chronic obstructive pulmonary disease) ([place])   . Diabetes mellitus    INSULIN DEPENDENT  . Gout   . Hyperlipemia   . Hypertension   . Psoriasis   . Renal disorder    congenital  . Single kidney     Past Surgical History:  Procedure Laterality Date  . ABDOMINAL HYSTERECTOMY    . CHOLECYSTECTOMY    . fibroid tumor    . gallbladder  1978  . LEFT AND RIGHT HEART CATHETERIZATION WITH CORONARY ANGIOGRAM N/A 11/29/2013   Procedure: LEFT AND RIGHT HEART CATHETERIZATION WITH CORONARY ANGIOGRAM;  Surgeon: Jacolyn Reedy, MD;  Location: [facility] CATH LAB;  Service: Cardiovascular;  Laterality: N/A;    Social History   Social History  . Marital status: Widowed    Spouse name: N/A  . Number of children: 2  . Years of education: N/A   Occupational History  . retired    Social History Main Topics  . Smoking status: Former Smoker    Packs/day: 1.00    Years: 20.00    Types: Cigarettes    Quit date: 02/22/2009  . Smokeless tobacco: Never Used  . Alcohol use No     Comment: quit 2011  . Drug use: No  . Sexual activity: No   Other Topics Concern  . Not on file   Social History Narrative  . No narrative on file    Allergies  Allergen Reactions  . Eggs Or Egg-Derived Products Nausea And Vomiting  . Lisinopril Nausea And Vomiting  . Penicillins Nausea  And Vomiting    Family History  Problem Relation Age of Onset  . Lupus Daughter   . Cancer Mother     Prior to Admission medications   Medication Sig Start Date End Date Taking? Authorizing Provider  albuterol (PROVENTIL HFA;VENTOLIN HFA) 108 (90 BASE) MCG/ACT inhaler Inhale 2 puffs into the lungs every 4 (four) hours as needed for shortness of breath.    Yes Historical Provider, MD  atorvastatin (LIPITOR) 80 MG tablet Take 80 mg by mouth daily.   Yes Historical Provider, MD  furosemide (LASIX) 40 MG tablet Take 0.5 tablets (20 mg total) by mouth 2 (two) times  daily. Patient taking differently: Take 40 mg by mouth 2 (two) times daily.  03/11/16  Yes Nita Sells, MD  insulin aspart (NOVOLOG) 100 UNIT/ML injection Inject 12 Units into the skin 3 (three) times daily before meals. If sugar is over 200 Patient taking differently: Inject 12 Units into the skin 3 (three) times daily as needed for high blood sugar. If sugar is over 200, takes 12 units as needed 03/11/16  Yes Nita Sells, MD  insulin detemir (LEVEMIR) 100 UNIT/ML injection Inject 0.62 mLs (62 Units total) into the skin daily. Patient taking differently: Inject 10 Units into the skin every morning.  03/11/16  Yes Nita Sells, MD  latanoprost (XALATAN) 0.005 % ophthalmic solution Place 1 drop into both eyes at bedtime.   Yes Historical Provider, MD  metoprolol tartrate (LOPRESSOR) 25 MG tablet Take 25 mg by mouth 2 (two) times daily.   Yes Historical Provider, MD  sitaGLIPtin (JANUVIA) 25 MG tablet Take 12.5 mg by mouth daily. Takes 1/2 tab   Yes Historical Provider, MD  telmisartan (MICARDIS) 80 MG tablet Take 80 mg by mouth daily.   Yes Historical Provider, MD  traMADol (ULTRAM) 50 MG tablet Take 50 mg by mouth 2 (two) times daily.   Yes Historical Provider, MD  triamcinolone cream (KENALOG) 0.1 % Apply 1 application topically 2 (two) times daily.   Yes Historical Provider, MD    Physical Exam: Vitals:   04/04/16 1615 04/04/16 1630 04/04/16 1645 04/04/16 1700  BP: 134/67 147/67 146/62 148/61  Pulse: 79 65 (!) 50 (!) 53  Resp: 21 21 20 21   Temp:      TempSrc:      SpO2: 98% 97% 97% 98%  Weight:      Height:         General: Appears calm and comfortable Eyes: PERRLA, EOMI, normal lids, iris ENT:  grossly normal hearing, lips & tongue, mucous membranes moist and intact Neck: no lymphoadenopathy, masses or thyromegaly Cardiovascular: RRR, no m/r/g. No JVD, carotid bruits. No LE edema.  Respiratory: bilateral no wheezes, rales, rhonchi or cracles. Normal respiratory  effort. No accessory muscle use observed Abdomen: soft, non-tender, non-distended, no organomegaly or masses appreciated. BS present in all quadrants Skin: no rash, crusted nodular ulcerated lesion of the right medial malleolus and erythema and skin thickening of the lower extremities bilaterally R>L Musculoskeletal: grossly normal tone BUE/BLE, good ROM, no bony abnormality or joint deformities observed Psychiatric: grossly normal mood and affect, speech fluent and appropriate, alert and oriented x3 Neurologic: CN II-XII grossly intact, moves all extremities in coordinated fashion, sensation intact  Labs on Admission: I have personally reviewed following labs and imaging studies  CBC, BMP  GFR: Estimated Creatinine Clearance: 27.6 mL/min (by C-G formula based on SCr of 2 mg/dL (H)).   Creatinine Clearance: Estimated Creatinine Clearance: 27.6 mL/min (by C-G formula based  on SCr of 2 mg/dL (H)).   Radiological Exams on Admission: Dg Chest 2 View  Result Date: 04/04/2016 CLINICAL DATA:  Chest tightness.  Chest pressure EXAM: CHEST  2 VIEW COMPARISON:  1/16/8 FINDINGS: Stable enlarged cardiac silhouette. There is central venous pulmonary congestion. No effusion, infiltrate or pneumothorax. No pleural fluid. No pneumothorax. IMPRESSION: Cardiomegaly and venous congestion. Electronically Signed   By: Suzy Bouchard M.D.   On: 04/04/2016 15:32    EKG: Independently reviewed - sinus rhythm with Q waves present in the anterolateral and inferior leads, not new  Assessment/Plan Principal Problem:   Chest pain Active Problems:   Kidney disease, chronic, stage III (GFR 30-59 ml/min)   OSA (obstructive sleep apnea)   Essential hypertension   Hyperlipidemia   Diabetes mellitus type 2, controlled (HCC)    Chest pain with SOB/orthopnea in patient with no previous history of CAD and known chronic systolic CHF She was discharged from the hospital on a half dose of Lasix in January. Patient  was seen by her PCP few days ago and and recommended to increase the dose of Lasix to full 80 mg bid Will place patient in observation, continue cardiac enzymes, monitor telemetry, give IV Lasix and monitor UOP, order VQ scan to r/o the possibility of PE Cardiology was notified by EDP   Lower extremities swelling  - most likely multifactorial and soul be associated with venous stasis dermatitis and possible DVT Will continue leg elevation, venous doppler US to r/o DVT   DM type II with Hgb A1c 8.8% on 03/10/2016 Hold oral antidiabetic meds, add SSI, monitor CBG's, maintain on Carb modified diet  CKD stage III Her creatinine is at baseline right now Continue to monitor and might expect some worsening with IV dose of Lasix  Hypertension - currently stable Continue home medication and adjust the doses if needed depending on the BP readings  Hyperlipidemia  Continue statin therapy and adjust the doses if needed   DVT prophylaxis: heparin Code Status: full Family Communication: at bedside Disposition Plan: telemetry Consults called: cardiology by EDP Admission status: observation   York Grice, PA-C Pager: [phone] [facility]  If 7PM-7AM, please contact night-coverage [email or web address] Password [facility]  04/04/2016, 5:17 PM

## 2016-04-04 NOTE — ED Triage Notes (Signed)
Pt started having CP this morning. EMS arrived, pt CP free and remains CP free at ED. EMS gave 324mg  ASA. No nitro. Denies nausea/vomiting. Some SOB reported, releieved with 2L [place]. sats 96 on room air. BP 144/90

## 2016-04-04 NOTE — ED Notes (Signed)
Pt not in room.

## 2016-04-04 NOTE — Progress Notes (Signed)
CCMD informed that the patient was in afib. She does not have a history of this. The physician was notified and an EKG was done. The EKG did not show AFIB.Will continue to monitor and inform the night RN to monitor as well.

## 2016-04-04 NOTE — Progress Notes (Signed)
Received critical troponin 0.06. Notified on call for [facility]. No new orders at this time. Will continue to monitor

## 2016-04-04 NOTE — ED Notes (Signed)
Report attempted 

## 2016-04-04 NOTE — ED Provider Notes (Signed)
[facility] DEPT Provider Note   CSN: [id number] Arrival date & time: 04/04/16  1413     History   Chief Complaint Chief Complaint  Patient presents with  . Chest Pain    HPI Stephanie Woodward is a 76 y.o. female. She presents with essentially 2 complaints. Chest pain, leg swelling.  HPI:  76 year old female with history of COPD, chronic kidney disease, CHF, diabetes, gout, hypertension, questionable anemia, psoriasis, solitary kidney, asthma, arthritis.   Had a catheterization in 2015 with Dr. Debara Pickett.  Patient awakened this morning with some pain in her chest. Central. Epigastric to midsternal and nonradiating. States felt like a pressure has chronic shortness of breath. His observation additional shortness of breath with the episode today. It lasted about 40 minutes and resolved. 3 hours later she had another episode lasted 15 or 20 minutes and again resolved. This prompted her presentation here.  Family states that she has had progressive swelling in her legs but had a recent admission Ultram edema. Had diuresis and medications adjusted.  Past Medical History:  Diagnosis Date  . Arthritis   . Asthma   . CHF (congestive heart failure) ([place])   . CKD (chronic kidney disease), stage III   . COPD (chronic obstructive pulmonary disease) ([place])   . Diabetes mellitus    INSULIN DEPENDENT  . Gout   . Hyperlipemia   . Hypertension   . Psoriasis   . Renal disorder    congenital  . Single kidney     Patient Active Problem List   Diagnosis Date Noted  . Acute on chronic renal failure ([place]) 03/09/2016  . Diabetes mellitus with complication ([place]) 32/99/2426  . Foot pain, bilateral 03/09/2016  . Renal disorder   . AKI (acute kidney injury) ([place])   . Bradycardia   . CHF (congestive heart failure) ([place]) 02/06/2014  . Cellulitis of right lower extremity 02/06/2014  . Essential hypertension 02/06/2014  . Hyperlipidemia 02/06/2014  . Diabetes mellitus type 2, controlled ([place])  02/06/2014  . Cellulitis 02/06/2014  . OSA (obstructive sleep apnea) 01/22/2014  . Hypoxemia 12/07/2013  . Type 2 diabetes mellitus, controlled, with renal complications ([place]) 83/41/9622  . Kidney disease, chronic, stage III (GFR 30-59 ml/min) 12/07/2013  . Pulmonary hypertension 12/07/2013  . Chronic systolic congestive heart failure ([place])   . Acute on chronic diastolic CHF (congestive heart failure), NYHA class 1 ([place]) 12/06/2013  . CHF exacerbation ([place]) 12/05/2013  . SOB (shortness of breath) 12/05/2013  . Gout 11/11/2009  . Obesity 11/11/2009  . Hypertensive heart disease     Past Surgical History:  Procedure Laterality Date  . ABDOMINAL HYSTERECTOMY    . CHOLECYSTECTOMY    . fibroid tumor    . gallbladder  1978  . LEFT AND RIGHT HEART CATHETERIZATION WITH CORONARY ANGIOGRAM N/A 11/29/2013   Procedure: LEFT AND RIGHT HEART CATHETERIZATION WITH CORONARY ANGIOGRAM;  Surgeon: Jacolyn Reedy, MD;  Location: [facility] CATH LAB;  Service: Cardiovascular;  Laterality: N/A;    OB History    No data available       Home Medications    Prior to Admission medications   Medication Sig Start Date End Date Taking? Authorizing Provider  albuterol (PROVENTIL HFA;VENTOLIN HFA) 108 (90 BASE) MCG/ACT inhaler Inhale 2 puffs into the lungs every 4 (four) hours as needed for shortness of breath.    Yes Historical Provider, MD  atorvastatin (LIPITOR) 80 MG tablet Take 80 mg by mouth daily.   Yes Historical Provider, MD  furosemide (  LASIX) 40 MG tablet Take 0.5 tablets (20 mg total) by mouth 2 (two) times daily. Patient taking differently: Take 40 mg by mouth 2 (two) times daily.  03/11/16  Yes Nita Sells, MD  insulin aspart (NOVOLOG) 100 UNIT/ML injection Inject 12 Units into the skin 3 (three) times daily before meals. If sugar is over 200 Patient taking differently: Inject 12 Units into the skin 3 (three) times daily as needed for high blood sugar. If sugar is over 200, takes 12 units as  needed 03/11/16  Yes Nita Sells, MD  insulin detemir (LEVEMIR) 100 UNIT/ML injection Inject 0.62 mLs (62 Units total) into the skin daily. Patient taking differently: Inject 10 Units into the skin every morning.  03/11/16  Yes Nita Sells, MD  latanoprost (XALATAN) 0.005 % ophthalmic solution Place 1 drop into both eyes at bedtime.   Yes Historical Provider, MD  metoprolol tartrate (LOPRESSOR) 25 MG tablet Take 25 mg by mouth 2 (two) times daily.   Yes Historical Provider, MD  sitaGLIPtin (JANUVIA) 25 MG tablet Take 12.5 mg by mouth daily. Takes 1/2 tab   Yes Historical Provider, MD  telmisartan (MICARDIS) 80 MG tablet Take 80 mg by mouth daily.   Yes Historical Provider, MD  traMADol (ULTRAM) 50 MG tablet Take 50 mg by mouth 2 (two) times daily.   Yes Historical Provider, MD  triamcinolone cream (KENALOG) 0.1 % Apply 1 application topically 2 (two) times daily.   Yes Historical Provider, MD    Family History Family History  Problem Relation Age of Onset  . Lupus Daughter   . Cancer Mother     Social History Social History  Substance Use Topics  . Smoking status: Former Smoker    Packs/day: 1.00    Years: 20.00    Types: Cigarettes    Quit date: 02/22/2009  . Smokeless tobacco: Never Used  . Alcohol use No     Comment: quit 2011     Allergies   Eggs or egg-derived products; Lisinopril; and Penicillins   Review of Systems Review of Systems  Constitutional: Negative for appetite change, chills, diaphoresis, fatigue and fever.  HENT: Negative for mouth sores, sore throat and trouble swallowing.   Eyes: Negative for visual disturbance.  Respiratory: Negative for cough, chest tightness, shortness of breath and wheezing.   Cardiovascular: Positive for chest pain and leg swelling.  Gastrointestinal: Negative for abdominal distention, abdominal pain, diarrhea, nausea and vomiting.  Endocrine: Negative for polydipsia, polyphagia and polyuria.  Genitourinary: Negative  for dysuria, frequency and hematuria.  Musculoskeletal: Negative for gait problem.  Skin: Negative for color change, pallor and rash.  Neurological: Negative for dizziness, syncope, light-headedness and headaches.  Hematological: Does not bruise/bleed easily.  Psychiatric/Behavioral: Negative for behavioral problems and confusion.     Physical Exam Updated Vital Signs BP 141/59   Pulse 82   Temp 99.5 F (37.5 C) (Oral)   Resp 14   Ht 5\' 3"  (1.6 m)   Wt 212 lb (96.2 kg)   SpO2 99%   BMI 37.55 kg/m   Physical Exam  Constitutional: She is oriented to person, place, and time. She appears well-developed and well-nourished. No distress.  HENT:  Head: Normocephalic.  Eyes: Conjunctivae are normal. Pupils are equal, round, and reactive to light. No scleral icterus.  Neck: Normal range of motion. Neck supple. No thyromegaly present.  Cardiovascular: Normal rate and regular rhythm.  Exam reveals no gallop and no friction rub.   No murmur heard. Pulmonary/Chest: Effort normal and  breath sounds normal. No respiratory distress. She has no wheezes. She has no rales.  Abdominal: Soft. Bowel sounds are normal. She exhibits no distension. There is no tenderness. There is no rebound.  Musculoskeletal: Normal range of motion.  Neurological: She is alert and oriented to person, place, and time.  Skin: Skin is warm and dry. No rash noted.  Bilateral LE edema. Marked thickening of the skin consistent with venous stasis disease. She has palpable DP and PT pulses on both sides. Skin is erythematous. These are not painful to her. She states that "better" than he did when she was hospitalized.  Psychiatric: She has a normal mood and affect. Her behavior is normal.     ED Treatments / Results  Labs (all labs ordered are listed, but only abnormal results are displayed) Labs Reviewed  BASIC METABOLIC PANEL - Abnormal; Notable for the following:       Result Value   Glucose, Bld 198 (*)    BUN 22  (*)    Creatinine, Ser 2.00 (*)    Calcium 8.7 (*)    GFR calc non Af Amer 24 (*)    GFR calc Af Amer 27 (*)    All other components within normal limits  CBC - Abnormal; Notable for the following:    Hemoglobin 10.4 (*)    HCT 33.6 (*)    RDW 15.8 (*)    All other components within normal limits  BRAIN NATRIURETIC PEPTIDE - Abnormal; Notable for the following:    B Natriuretic Peptide 407.5 (*)    All other components within normal limits  I-STAT TROPOININ, ED    EKG  EKG Interpretation  Date/Time:  Sunday April 04 2016 14:29:27 EST Ventricular Rate:  91 PR Interval:    QRS Duration: 118 QT Interval:  426 QTC Calculation: 525 R Axis:   -119 Text Interpretation:  Sinus arrhythmia Nonspecific IVCD with LAD Anterolateral infarct, old Baseline wander in lead(s) V1 Since prior ECG, sinus arrhythmia is new Confirmed by [facility] MD, Junie Panning ([id number]) on 04/04/2016 2:36:09 PM Also confirmed by Jeneen Rinks  MD, [place] ([id number])  on 04/04/2016 2:56:45 PM       Radiology Dg Chest 2 View  Result Date: 04/04/2016 CLINICAL DATA:  Chest tightness.  Chest pressure EXAM: CHEST  2 VIEW COMPARISON:  1/16/8 FINDINGS: Stable enlarged cardiac silhouette. There is central venous pulmonary congestion. No effusion, infiltrate or pneumothorax. No pleural fluid. No pneumothorax. IMPRESSION: Cardiomegaly and venous congestion. Electronically Signed   By: Suzy Bouchard M.D.   On: 04/04/2016 15:32    Procedures Procedures (including critical care time)  Medications Ordered in ED Medications - No data to display   Initial Impression / Assessment and Plan / ED Course  I have reviewed the triage vital signs and the nursing notes.  Pertinent labs & imaging results that were available during my care of the patient were reviewed by me and considered in my medical decision making (see chart for details).     CATH, 11/2013:   IMPRESSIONS:  1. Left dominant system with coronary artery flow supplied  predominantly through the circumflex. There is no significant obstructive stenoses noted in the circumflex. Diminutive LAD and nondominant right noted 2. 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 Moderate pulmonary hypertension  Patient states that her Lasix was decreased when she left the hospital. Her renal function has improved from a creatinine of over 4, 2.0 today. She states that her legs when hasn't as well. Not  febrile. Doubt that this is cellulitis, or DVT. Not clinically or radiographically acute congestive heart failure.  Pain her chest at rest did not relieve with sitting upright. Was taken off of an unknown acid blocker in the hospital recently. States she does not know what her reflux feels like she was on the medicine for "a long time". However this worsened immediately was standing and walking across her house. Reassuring 2-1/2 years ago. However, multiple risk factors and heart score of 6. We'll discussed with hospitalist regarding admission for rule out.   Final Clinical Impressions(s) / ED Diagnoses   Final diagnoses:  Chest pain, unspecified type    New Prescriptions New Prescriptions   No medications on file     Tanna Furry, MD 04/04/16 1628

## 2016-04-05 ENCOUNTER — Observation Stay (HOSPITAL_BASED_OUTPATIENT_CLINIC_OR_DEPARTMENT_OTHER): Payer: PPO

## 2016-04-05 ENCOUNTER — Observation Stay (HOSPITAL_COMMUNITY): Payer: PPO

## 2016-04-05 DIAGNOSIS — E1122 Type 2 diabetes mellitus with diabetic chronic kidney disease: Secondary | ICD-10-CM | POA: Diagnosis not present

## 2016-04-05 DIAGNOSIS — I10 Essential (primary) hypertension: Secondary | ICD-10-CM | POA: Diagnosis not present

## 2016-04-05 DIAGNOSIS — Z794 Long term (current) use of insulin: Secondary | ICD-10-CM | POA: Diagnosis not present

## 2016-04-05 DIAGNOSIS — M7989 Other specified soft tissue disorders: Secondary | ICD-10-CM | POA: Diagnosis not present

## 2016-04-05 DIAGNOSIS — M79609 Pain in unspecified limb: Secondary | ICD-10-CM | POA: Diagnosis not present

## 2016-04-05 DIAGNOSIS — G4733 Obstructive sleep apnea (adult) (pediatric): Secondary | ICD-10-CM | POA: Diagnosis not present

## 2016-04-05 DIAGNOSIS — N186 End stage renal disease: Secondary | ICD-10-CM | POA: Diagnosis not present

## 2016-04-05 DIAGNOSIS — E785 Hyperlipidemia, unspecified: Secondary | ICD-10-CM | POA: Diagnosis not present

## 2016-04-05 DIAGNOSIS — N183 Chronic kidney disease, stage 3 (moderate): Secondary | ICD-10-CM

## 2016-04-05 DIAGNOSIS — R079 Chest pain, unspecified: Secondary | ICD-10-CM

## 2016-04-05 DIAGNOSIS — I5033 Acute on chronic diastolic (congestive) heart failure: Secondary | ICD-10-CM | POA: Diagnosis not present

## 2016-04-05 DIAGNOSIS — R0789 Other chest pain: Secondary | ICD-10-CM | POA: Diagnosis not present

## 2016-04-05 DIAGNOSIS — J449 Chronic obstructive pulmonary disease, unspecified: Secondary | ICD-10-CM | POA: Diagnosis not present

## 2016-04-05 DIAGNOSIS — Z880 Allergy status to penicillin: Secondary | ICD-10-CM | POA: Diagnosis not present

## 2016-04-05 DIAGNOSIS — I129 Hypertensive chronic kidney disease with stage 1 through stage 4 chronic kidney disease, or unspecified chronic kidney disease: Secondary | ICD-10-CM | POA: Diagnosis not present

## 2016-04-05 DIAGNOSIS — M1990 Unspecified osteoarthritis, unspecified site: Secondary | ICD-10-CM | POA: Diagnosis not present

## 2016-04-05 LAB — BASIC METABOLIC PANEL
Anion gap: 9 (ref 5–15)
BUN: 25 mg/dL — ABNORMAL HIGH (ref 6–20)
CO2: 27 mmol/L (ref 22–32)
Calcium: 8.7 mg/dL — ABNORMAL LOW (ref 8.9–10.3)
Chloride: 106 mmol/L (ref 101–111)
Creatinine, Ser: 2.07 mg/dL — ABNORMAL HIGH (ref 0.44–1.00)
GFR calc Af Amer: 26 mL/min — ABNORMAL LOW (ref >60)
GFR calc non Af Amer: 23 mL/min — ABNORMAL LOW (ref >60)
Glucose, Bld: 153 mg/dL — ABNORMAL HIGH (ref 65–99)
Potassium: 4.1 mmol/L (ref 3.5–5.1)
Sodium: 142 mmol/L (ref 135–145)

## 2016-04-05 LAB — ECHOCARDIOGRAM COMPLETE
E decel time: 169 msec
E/e' ratio: 14.13
FS: 22 % — AB (ref 28–44)
Height: 63 in
IVS/LV PW RATIO, ED: 1
LA ID, A-P, ES: 46 mm
LA diam end sys: 46 mm
LA diam index: 2.18 cm/m2
LA vol A4C: 68 ml
LA vol index: 32.6 mL/m2
LA vol: 69 mL
LV E/e' medial: 14.13
LV E/e'average: 14.13
LV PW d: 11.7 mm — AB (ref 0.6–1.1)
LV e' LATERAL: 7.57 cm/s
LVOT SV: 56 mL
LVOT VTI: 24.8 cm
LVOT area: 2.27 cm2
LVOT diameter: 17 mm
LVOT peak grad rest: 5 mmHg
LVOT peak vel: 112 cm/s
MV Dec: 169
MV Peak grad: 5 mmHg
MV pk A vel: 102 m/s
MV pk E vel: 107 m/s
TDI e' lateral: 7.57
TDI e' medial: 6.14
Weight: 3392 oz

## 2016-04-05 LAB — GLUCOSE, CAPILLARY
Glucose-Capillary: 82 mg/dL (ref 65–99)
Glucose-Capillary: 169 mg/dL — ABNORMAL HIGH (ref 65–99)
Glucose-Capillary: 190 mg/dL — ABNORMAL HIGH (ref 65–99)
Glucose-Capillary: 81 mg/dL (ref 65–99)

## 2016-04-05 LAB — CBC
HCT: 35.7 % — ABNORMAL LOW (ref 36.0–46.0)
Hemoglobin: 11 g/dL — ABNORMAL LOW (ref 12.0–15.0)
MCH: 26.1 pg (ref 26.0–34.0)
MCHC: 30.8 g/dL (ref 30.0–36.0)
MCV: 84.8 fL (ref 78.0–100.0)
Platelets: 304 10*3/uL (ref 150–400)
RBC: 4.21 MIL/uL (ref 3.87–5.11)
RDW: 15.9 % — ABNORMAL HIGH (ref 11.5–15.5)
WBC: 10.8 10*3/uL — ABNORMAL HIGH (ref 4.0–10.5)

## 2016-04-05 LAB — TROPONIN I: Troponin I: 0.06 ng/mL (ref <0.03)

## 2016-04-05 MED ORDER — TECHNETIUM TO 99M ALBUMIN AGGREGATED
4.2000 | Freq: Once | INTRAVENOUS | Status: AC | PRN
  Administered 2016-04-05: 4.2 via INTRAVENOUS

## 2016-04-05 MED ORDER — FUROSEMIDE 80 MG PO TABS
80.0000 mg | ORAL_TABLET | Freq: Every day | ORAL | Status: DC
  Administered 2016-04-05 – 2016-04-06 (×2): 80 mg via ORAL
  Filled 2016-04-05 (×2): qty 1

## 2016-04-05 MED ORDER — TECHNETIUM TC 99M DIETHYLENETRIAME-PENTAACETIC ACID
31.8000 | Freq: Once | INTRAVENOUS | Status: AC | PRN
  Administered 2016-04-05: 31.8 via RESPIRATORY_TRACT

## 2016-04-05 MED ORDER — DIPHENHYDRAMINE HCL 25 MG PO CAPS
50.0000 mg | ORAL_CAPSULE | Freq: Once | ORAL | Status: AC
  Administered 2016-04-05: 50 mg via ORAL
  Filled 2016-04-05: qty 2

## 2016-04-05 MED ORDER — INSULIN DETEMIR 100 UNIT/ML ~~LOC~~ SOLN
15.0000 [IU] | Freq: Every day | SUBCUTANEOUS | Status: DC
  Filled 2016-04-05: qty 0.15

## 2016-04-05 NOTE — Progress Notes (Signed)
Inpatient Diabetes Program Recommendations  AACE/ADA: New Consensus Statement on Inpatient Glycemic Control (2015)  Target Ranges:  Prepandial:   less than 140 mg/dL      Peak postprandial:   less than 180 mg/dL (1-2 hours)      Critically ill patients:  140 - 180 mg/dL   Lab Results  Component Value Date   GLUCAP 169 (H) 04/05/2016   HGBA1C 8.8 (H) 03/10/2016    Review of Glycemic Control Results for Stephanie Woodward, Stephanie Woodward (MRN [id number]) as of 04/05/2016 12:18  Ref. Range 03/11/2016 11:49 04/04/2016 19:30 04/04/2016 21:42 04/05/2016 06:13 04/05/2016 11:39  Glucose-Capillary Latest Ref Range: 65 - 99 mg/dL 243 (H) 217 (H) 222 (H) 82 169 (H)    Inpatient Diabetes Program Recommendations:   Please consider decrease in Levemir to 10 units q hs. Patient is currently only taking 10 units daily @ home.  Thank you, Nani Gasser. Danzel Marszalek, RN, MSN, CDE Inpatient Glycemic Control Team Team Pager [phone] (8am-5pm) 04/05/2016 12:20 PM

## 2016-04-05 NOTE — Care Management Obs Status (Signed)
[place] NOTIFICATION   Patient Details  Name: Stephanie Woodward MRN: [id number] Date of Birth: 1940/09/12   Medicare Observation Status Notification Given:  Yes    Dawayne Patricia, RN 04/05/2016, 4:39 PM

## 2016-04-05 NOTE — Progress Notes (Signed)
PROGRESS NOTE    Stephanie Woodward  YKD:[id number] DOB: [birth year] DOA: 04/04/2016 PCP: Donnie Coffin, MD   Outpatient Specialists:     Brief Narrative:  Stephanie Woodward is a 76 y.o. female with medical history significant of CHF, chronic end-stage kidney disease, diabetes, HTN,, COPD, toxic metabolic encephalopathy with hospitalization in January 2018 who presented to the emergency room via EMS with complaints of shortness of breath. Onset of symptoms this morning and the pain awakened him from sleep. She describes the pain as pressure located substernally in the middle of the chest and upper epigastric area. The initial episode of pain lasted for about 35-40 minutes and a few hours later she developed another episode of 10-20 minutes.She called EMS and upon their arrival was pain free, was given She also experienced some shortness of breath and severe orthopnea when attempted to lay flat during transportation to the ED  that improved with supplemental oxygen. While in the ED patient was pain free as well. She also c/o lower extremities swelling R?L and apin in the popliteal and medial aspect of the right leg Patient had coronary angiography in 2015 but reportedly didn't show obstructive coronary disease.   echocardiogram 2015 showed EF of 45-50%, moderate LVH and mild MR    Assessment & Plan:   Principal Problem:   Chest pain Active Problems:   Kidney disease, chronic, stage III (GFR 30-59 ml/min)   OSA (obstructive sleep apnea)   Essential hypertension   Hyperlipidemia   Diabetes mellitus type 2, controlled (HCC)   Chest pain -troponin flat -suspect due to volume overload  SOB -suspect due to volume overload -V/Q scan pending to r/o blood clots  Acute on chronic diastolic HF -weight up > 39JQB -lasix reduced during last visit -responded well to IV lasix I/os not being recorded nor is weight-- ordered Echo shows: grade 2 diastolic CHF  CKD- stage III -need to follow up  with Dr. Jonnie Finner  DM -SSI -decrease levemir  DVT prophylaxis:  SQ Heparin  Code Status: Full Code   Family Communication: Daughter on phone  Disposition Plan:     Consultants:       Subjective: Breathing better   Objective: Vitals:   04/04/16 1818 04/04/16 2140 04/05/16 0409 04/05/16 1012  BP: 136/82 (!) 153/53 (!) 154/73   Pulse: 88 60 71 87  Resp: 20 20 20    Temp: 98.3 F (36.8 C) 98.8 F (37.1 C) 98.8 F (37.1 C)   TempSrc: Oral Oral Oral   SpO2: 98% 98% 98%   Weight:      Height:        Intake/Output Summary (Last 24 hours) at 04/05/16 1211 Last data filed at 04/05/16 0730  Gross per 24 hour  Intake              240 ml  Output                0 ml  Net              240 ml   Filed Weights   04/04/16 1430  Weight: 96.2 kg (212 lb)    Examination:  General exam: Appears calm and comfortable  Respiratory system: diminished, no wheezing. Respiratory effort normal. Cardiovascular system: S1 & S2 heard, RRR. No JVD, murmurs, rubs, gallops or clicks. No pedal edema. Gastrointestinal system: Abdomen is nondistended, soft and nontender. No organomegaly or masses felt. Normal bowel sounds heard. Central nervous system: Alert and oriented. No focal neurological deficits.  Data Reviewed: I have personally reviewed following labs and imaging studies  CBC:  Recent Labs Lab 04/04/16 1437 04/05/16 0929  WBC 10.4 10.8*  HGB 10.4* 11.0*  HCT 33.6* 35.7*  MCV 84.8 84.8  PLT 266 154   Basic Metabolic Panel:  Recent Labs Lab 04/04/16 1437 04/05/16 0929  NA 142 142  K 4.2 4.1  CL 109 106  CO2 28 27  GLUCOSE 198* 153*  BUN 22* 25*  CREATININE 2.00* 2.07*  CALCIUM 8.7* 8.7*   GFR: Estimated Creatinine Clearance: 26.7 mL/min (by C-G formula based on SCr of 2.07 mg/dL (H)). Liver Function Tests: No results for input(s): AST, ALT, ALKPHOS, BILITOT, PROT, ALBUMIN in the last 168 hours. No results for input(s): LIPASE, AMYLASE in the  last 168 hours. No results for input(s): AMMONIA in the last 168 hours. Coagulation Profile: No results for input(s): INR, PROTIME in the last 168 hours. Cardiac Enzymes:  Recent Labs Lab 04/04/16 1838 04/04/16 2032 04/05/16 0007  TROPONINI 0.06* 0.05* 0.06*   BNP (last 3 results) No results for input(s): PROBNP in the last 8760 hours. HbA1C: No results for input(s): HGBA1C in the last 72 hours. CBG:  Recent Labs Lab 04/04/16 1930 04/04/16 2142 04/05/16 0613 04/05/16 1139  GLUCAP 217* 222* 82 169*   Lipid Profile: No results for input(s): CHOL, HDL, LDLCALC, TRIG, CHOLHDL, LDLDIRECT in the last 72 hours. Thyroid Function Tests: No results for input(s): TSH, T4TOTAL, FREET4, T3FREE, THYROIDAB in the last 72 hours. Anemia Panel: No results for input(s): VITAMINB12, FOLATE, FERRITIN, TIBC, IRON, RETICCTPCT in the last 72 hours. Urine analysis:    Component Value Date/Time   COLORURINE STRAW (A) 03/09/2016 0648   APPEARANCEUR CLEAR 03/09/2016 0648   LABSPEC 1.009 03/09/2016 0648   PHURINE 5.0 03/09/2016 0648   GLUCOSEU NEGATIVE 03/09/2016 0648   HGBUR NEGATIVE 03/09/2016 [place] 03/09/2016 [place] 03/09/2016 0648   PROTEINUR 30 (A) 03/09/2016 0648   UROBILINOGEN 0.2 12/05/2013 1657   NITRITE NEGATIVE 03/09/2016 0648   LEUKOCYTESUR TRACE (A) 03/09/2016 0648     )No results found for this or any previous visit (from the past 240 hour(s)).    Anti-infectives    None       Radiology Studies: Dg Chest 2 View  Result Date: 04/04/2016 CLINICAL DATA:  Chest tightness.  Chest pressure EXAM: CHEST  2 VIEW COMPARISON:  1/16/8 FINDINGS: Stable enlarged cardiac silhouette. There is central venous pulmonary congestion. No effusion, infiltrate or pneumothorax. No pleural fluid. No pneumothorax. IMPRESSION: Cardiomegaly and venous congestion. Electronically Signed   By: Suzy Bouchard M.D.   On: 04/04/2016 15:32         Scheduled Meds: . aspirin EC  325 mg Oral Daily  . atorvastatin  80 mg Oral Daily  . heparin  5,000 Units Subcutaneous Q8H  . insulin aspart  0-15 Units Subcutaneous TID WC  . insulin aspart  10 Units Subcutaneous TID AC  . insulin detemir  30 Units Subcutaneous Daily  . irbesartan  150 mg Oral Daily  . latanoprost  1 drop Both Eyes QHS  . metoprolol tartrate  25 mg Oral BID  . traMADol  50 mg Oral BID  . triamcinolone cream  1 application Topical BID   Continuous Infusions:   LOS: 0 days    Time spent: 25 min    [place], DO [facility] Pager [phone]  If 7PM-7AM, please contact night-coverage [email or web address] Password [facility] 04/05/2016, 12:11 PM

## 2016-04-05 NOTE — Progress Notes (Signed)
*  PRELIMINARY RESULTS* Vascular Ultrasound Bilateral lower extremity venous duplex has been completed.  Preliminary findings: No evidence of deep vein thrombosis  In the visualized veins of the lower extremities.  Possible baker's cysts on the left.   Everrett Coombe 04/05/2016, 3:29 PM

## 2016-04-05 NOTE — Progress Notes (Signed)
  Echocardiogram 2D Echocardiogram has been performed.  Stephanie Woodward L Androw 04/05/2016, 11:39 AM

## 2016-04-06 DIAGNOSIS — N183 Chronic kidney disease, stage 3 (moderate): Secondary | ICD-10-CM | POA: Diagnosis not present

## 2016-04-06 DIAGNOSIS — Z794 Long term (current) use of insulin: Secondary | ICD-10-CM | POA: Diagnosis not present

## 2016-04-06 DIAGNOSIS — I10 Essential (primary) hypertension: Secondary | ICD-10-CM | POA: Diagnosis not present

## 2016-04-06 DIAGNOSIS — G4733 Obstructive sleep apnea (adult) (pediatric): Secondary | ICD-10-CM | POA: Diagnosis not present

## 2016-04-06 DIAGNOSIS — E1122 Type 2 diabetes mellitus with diabetic chronic kidney disease: Secondary | ICD-10-CM

## 2016-04-06 LAB — HEPATIC FUNCTION PANEL
ALT: 13 U/L — ABNORMAL LOW (ref 14–54)
AST: 13 U/L — ABNORMAL LOW (ref 15–41)
Albumin: 2.3 g/dL — ABNORMAL LOW (ref 3.5–5.0)
Alkaline Phosphatase: 99 U/L (ref 38–126)
Bilirubin, Direct: 0.1 mg/dL (ref 0.1–0.5)
Indirect Bilirubin: 0.4 mg/dL (ref 0.3–0.9)
Total Bilirubin: 0.5 mg/dL (ref 0.3–1.2)
Total Protein: 5.5 g/dL — ABNORMAL LOW (ref 6.5–8.1)

## 2016-04-06 LAB — GLUCOSE, CAPILLARY
Glucose-Capillary: 116 mg/dL — ABNORMAL HIGH (ref 65–99)
Glucose-Capillary: 287 mg/dL — ABNORMAL HIGH (ref 65–99)

## 2016-04-06 LAB — CBC
HCT: 33.6 % — ABNORMAL LOW (ref 36.0–46.0)
Hemoglobin: 10.2 g/dL — ABNORMAL LOW (ref 12.0–15.0)
MCH: 26 pg (ref 26.0–34.0)
MCHC: 30.4 g/dL (ref 30.0–36.0)
MCV: 85.7 fL (ref 78.0–100.0)
Platelets: 272 10*3/uL (ref 150–400)
RBC: 3.92 MIL/uL (ref 3.87–5.11)
RDW: 15.9 % — ABNORMAL HIGH (ref 11.5–15.5)
WBC: 9.1 10*3/uL (ref 4.0–10.5)

## 2016-04-06 LAB — BASIC METABOLIC PANEL
Anion gap: 8 (ref 5–15)
BUN: 24 mg/dL — ABNORMAL HIGH (ref 6–20)
CO2: 26 mmol/L (ref 22–32)
Calcium: 8.2 mg/dL — ABNORMAL LOW (ref 8.9–10.3)
Chloride: 104 mmol/L (ref 101–111)
Creatinine, Ser: 2.04 mg/dL — ABNORMAL HIGH (ref 0.44–1.00)
GFR calc Af Amer: 27 mL/min — ABNORMAL LOW (ref >60)
GFR calc non Af Amer: 23 mL/min — ABNORMAL LOW (ref >60)
Glucose, Bld: 124 mg/dL — ABNORMAL HIGH (ref 65–99)
Potassium: 4.1 mmol/L (ref 3.5–5.1)
Sodium: 138 mmol/L (ref 135–145)

## 2016-04-06 MED ORDER — INSULIN DETEMIR 100 UNIT/ML ~~LOC~~ SOLN: 10.0000 [IU] | SUBCUTANEOUS | Status: DC

## 2016-04-06 MED ORDER — FUROSEMIDE 40 MG PO TABS: 40.0000 mg | ORAL_TABLET | Freq: Two times a day (BID) | ORAL | Status: DC

## 2016-04-06 NOTE — Progress Notes (Signed)
Discussed with the patient and all questioned fully answered. She will call me if any problems arise.  IV removed. Telemetry removed, CCMD notified.   Discussed upcoming appointments, medications, and when to call the MD. Reviewed the 'living better with heart failure' booklet - pt verbalized understanding of daily weights and their importance.  Pt awaiting daughter's arrival.  Fritz Pickerel, RN

## 2016-04-06 NOTE — Discharge Summary (Signed)
Physician Discharge Summary  Stephanie Woodward IDP:[id number] DOB: 13-Feb-1941 DOA: 04/04/2016  PCP: Donnie Coffin, MD  Admit date: 04/04/2016 Discharge date: 04/06/2016   Recommendations for Outpatient Follow-Up:   BMP 1 week re Cr-- needs close follow up with renal Patient to weigh self daily   Discharge Diagnosis:   Principal Problem:   Chest pain Active Problems:   Kidney disease, chronic, stage III (GFR 30-59 ml/min)   OSA (obstructive sleep apnea)   Essential hypertension   Hyperlipidemia   Diabetes mellitus type 2, controlled ([place])   Discharge disposition:  Home.   Discharge Condition: Improved.  Diet recommendation: Low sodium, heart healthy.  Carbohydrate-modified.  Wound care: None.   History of Present Illness:   Stephanie Woodward is a 76 y.o. female with medical history significant of CHF, chronic end-stage kidney disease, diabetes, HTN,, COPD, toxic metabolic encephalopathy with hospitalization in January 2018 who presented to the emergency room via EMS with complaints of shortness of breath. Onset of symptoms this morning and the pain awakened him from sleep. She describes the pain as pressure located substernally in the middle of the chest and upper epigastric area. The initial episode of pain lasted for about 35-40 minutes and a few hours later she developed another episode of 10-20 minutes.She called EMS and upon their arrival was pain free, was given She also experienced some shortness of breath and severe orthopnea when attempted to lay flat during transportation to the ED  that improved with supplemental oxygen. While in the ED patient was pain free as well. She also c/o lower extremities swelling R?L and apin in the popliteal and medial aspect of the right leg Patient had coronary angiography in 2015 but reportedly didn't show obstructive coronary disease.  Studies echocardiogram thousand 15 showed EF of 45-50%, moderate LVH and mild MR     Hospital Course  by Problem:   Chest pain -troponin flat -suspect due to volume overload  SOB -suspect due to volume overload- resolved with IV lasix -V/Q scan negative LE duplex negative  Acute on chronic diastolic HF -weight up > 44RXV -lasix reduced during last visit -responded well to IV lasix -resume previous home meds 80mg  daily Echo shows: grade 2 diastolic CHF  CKD- stage III -need to follow up with Dr. Moshe Cipro BMP 1 week  DM -decrease levemir to home dose     Medical Consultants:    None.   Discharge Exam:   Vitals:   04/05/16 2227 04/06/16 0504  BP: (!) 159/73 (!) 149/73  Pulse: 94 86  Resp:  18  Temp:  98.2 F (36.8 C)   Vitals:   04/05/16 2000 04/05/16 2227 04/06/16 0500 04/06/16 0504  BP: (!) 145/82 (!) 159/73  (!) 149/73  Pulse: 85 94  86  Resp: 18   18  Temp: 98.5 F (36.9 C)   98.2 F (36.8 C)  TempSrc: Oral   Oral  SpO2: 100%   98%  Weight: 93.6 kg (206 lb 6.4 oz)  94.6 kg (208 lb 8 oz)   Height:        Gen:  NAD   The results of significant diagnostics from this hospitalization (including imaging, microbiology, ancillary and laboratory) are listed below for reference.     Procedures and Diagnostic Studies:   Dg Chest 2 View  Result Date: 04/04/2016 CLINICAL DATA:  Chest tightness.  Chest pressure EXAM: CHEST  2 VIEW COMPARISON:  1/16/8 FINDINGS: Stable enlarged cardiac silhouette. There is central venous pulmonary congestion. No effusion,  infiltrate or pneumothorax. No pleural fluid. No pneumothorax. IMPRESSION: Cardiomegaly and venous congestion. Electronically Signed   By: Suzy Bouchard M.D.   On: 04/04/2016 15:32   Nm Pulmonary Perf And Vent  Result Date: 04/05/2016 CLINICAL DATA:  76 year old current history of hypertension, diabetes, COPD, asthma, CHF and stage 3 chronic kidney disease who was admitted yesterday with acute onset of chest pain and shortness of breath. EXAM: NUCLEAR MEDICINE VENTILATION - PERFUSION LUNG SCAN  TECHNIQUE: Ventilation images were obtained in multiple projections using inhaled aerosol Tc-77m DTPA. Perfusion images were obtained in multiple projections after intravenous injection of Tc-75m MAA. RADIOPHARMACEUTICALS:  32.4 mCi Technetium-70m DTPA aerosol inhalation and 4.15 mCi Technetium-26m MAA IV COMPARISON:  No prior nuclear imaging. Two-view chest x-ray yesterday. FINDINGS: Ventilation: Central airway aerosol deposition. No focal ventilation defect. Perfusion: No wedge shaped peripheral perfusion defects to suggest acute pulmonary embolism. Homogeneous perfusion throughout both lungs. IMPRESSION: Normal examination. Electronically Signed   By: Evangeline Dakin M.D.   On: 04/05/2016 12:46     Labs:   Basic Metabolic Panel:  Recent Labs Lab 04/04/16 1437 04/05/16 0929 04/06/16 0309  NA 142 142 138  K 4.2 4.1 4.1  CL 109 106 104  CO2 28 27 26   GLUCOSE 198* 153* 124*  BUN 22* 25* 24*  CREATININE 2.00* 2.07* 2.04*  CALCIUM 8.7* 8.7* 8.2*   GFR Estimated Creatinine Clearance: 26.9 mL/min (by C-G formula based on SCr of 2.04 mg/dL (H)). Liver Function Tests:  Recent Labs Lab 04/06/16 0309  AST 13*  ALT 13*  ALKPHOS 99  BILITOT 0.5  PROT 5.5*  ALBUMIN 2.3*   No results for input(s): LIPASE, AMYLASE in the last 168 hours. No results for input(s): AMMONIA in the last 168 hours. Coagulation profile No results for input(s): INR, PROTIME in the last 168 hours.  CBC:  Recent Labs Lab 04/04/16 1437 04/05/16 0929 04/06/16 0309  WBC 10.4 10.8* 9.1  HGB 10.4* 11.0* 10.2*  HCT 33.6* 35.7* 33.6*  MCV 84.8 84.8 85.7  PLT 266 304 272   Cardiac Enzymes:  Recent Labs Lab 04/04/16 1838 04/04/16 2032 04/05/16 0007  TROPONINI 0.06* 0.05* 0.06*   BNP: Invalid input(s): POCBNP CBG:  Recent Labs Lab 04/05/16 0613 04/05/16 1139 04/05/16 1640 04/05/16 2211 04/06/16 0603  GLUCAP 82 169* 190* 81 116*   D-Dimer No results for input(s): DDIMER in the last 72  hours. Hgb A1c No results for input(s): HGBA1C in the last 72 hours. Lipid Profile No results for input(s): CHOL, HDL, LDLCALC, TRIG, CHOLHDL, LDLDIRECT in the last 72 hours. Thyroid function studies No results for input(s): TSH, T4TOTAL, T3FREE, THYROIDAB in the last 72 hours.  Invalid input(s): FREET3 Anemia work up No results for input(s): VITAMINB12, FOLATE, FERRITIN, TIBC, IRON, RETICCTPCT in the last 72 hours. Microbiology No results found for this or any previous visit (from the past 240 hour(s)).   Discharge Instructions:   Discharge Instructions    (HEART FAILURE PATIENTS) Call MD:  Anytime you have any of the following symptoms: 1) 3 pound weight gain in 24 hours or 5 pounds in 1 week 2) shortness of breath, with or without a dry hacking cough 3) swelling in the hands, feet or stomach 4) if you have to sleep on extra pillows at night in order to breathe.    Complete by:  As directed    Diet - low sodium heart healthy    Complete by:  As directed    Discharge instructions  Complete by:  As directed    BMP 1 week--- need close follow up with renal and cardiology Bring log of blood sugars and weight to PCP visit   Increase activity slowly    Complete by:  As directed      Allergies as of 04/06/2016      Reactions   Eggs Or Egg-derived Products Nausea And Vomiting   Lisinopril Nausea And Vomiting   Penicillins Nausea And Vomiting      Medication List    TAKE these medications   albuterol 108 (90 Base) MCG/ACT inhaler Commonly known as:  PROVENTIL HFA;VENTOLIN HFA Inhale 2 puffs into the lungs every 4 (four) hours as needed for shortness of breath.   atorvastatin 80 MG tablet Commonly known as:  LIPITOR Take 80 mg by mouth daily.   furosemide 40 MG tablet Commonly known as:  LASIX Take 1 tablet (40 mg total) by mouth 2 (two) times daily.   insulin aspart 100 UNIT/ML injection Commonly known as:  novoLOG Inject 12 Units into the skin 3 (three) times daily  before meals. If sugar is over 200 What changed:  when to take this  reasons to take this  additional instructions   insulin detemir 100 UNIT/ML injection Commonly known as:  LEVEMIR Inject 0.1 mLs (10 Units total) into the skin every morning.   latanoprost 0.005 % ophthalmic solution Commonly known as:  XALATAN Place 1 drop into both eyes at bedtime.   metoprolol tartrate 25 MG tablet Commonly known as:  LOPRESSOR Take 25 mg by mouth 2 (two) times daily.   sitaGLIPtin 25 MG tablet Commonly known as:  JANUVIA Take 12.5 mg by mouth daily. Takes 1/2 tab   telmisartan 80 MG tablet Commonly known as:  MICARDIS Take 80 mg by mouth daily.   traMADol 50 MG tablet Commonly known as:  ULTRAM Take 50 mg by mouth 2 (two) times daily.   triamcinolone cream 0.1 % Commonly known as:  KENALOG Apply 1 application topically 2 (two) times daily.      Follow-up Information    Donnie Coffin, MD Follow up in 1 week(s).   Specialty:  Family Medicine Contact information: [address] [place] [address] [phone]        Louis Meckel, MD Follow up in 1 month(s).   Specialty:  Nephrology Why:  for CR follow up Contact information: [place] [address] [phone]        Ezzard Standing, MD Follow up in 1 month(s).   Specialty:  Cardiology Why:  volume overload Contact information: [place] [address] [phone]            Time coordinating discharge: 35 min  Signed:  Albertus Chiarelli Alison Stalling   [facility] 04/06/2016, 8:28 AM

## 2016-04-08 ENCOUNTER — Encounter (HOSPITAL_COMMUNITY): Payer: Self-pay | Admitting: Emergency Medicine

## 2016-04-08 ENCOUNTER — Emergency Department (HOSPITAL_COMMUNITY): Payer: PPO

## 2016-04-08 ENCOUNTER — Observation Stay (HOSPITAL_COMMUNITY)
Admission: EM | Admit: 2016-04-08 | Discharge: 2016-04-10 | Disposition: A | Discharge status: Home / Self Care | Payer: PPO | Attending: Internal Medicine | Admitting: Internal Medicine

## 2016-04-08 DIAGNOSIS — R0789 Other chest pain: Secondary | ICD-10-CM | POA: Diagnosis present

## 2016-04-08 DIAGNOSIS — R079 Chest pain, unspecified: Secondary | ICD-10-CM | POA: Diagnosis not present

## 2016-04-08 DIAGNOSIS — I5033 Acute on chronic diastolic (congestive) heart failure: Secondary | ICD-10-CM | POA: Insufficient documentation

## 2016-04-08 DIAGNOSIS — I480 Paroxysmal atrial fibrillation: Secondary | ICD-10-CM | POA: Diagnosis present

## 2016-04-08 DIAGNOSIS — J449 Chronic obstructive pulmonary disease, unspecified: Secondary | ICD-10-CM | POA: Insufficient documentation

## 2016-04-08 DIAGNOSIS — E1122 Type 2 diabetes mellitus with diabetic chronic kidney disease: Secondary | ICD-10-CM | POA: Insufficient documentation

## 2016-04-08 DIAGNOSIS — N183 Chronic kidney disease, stage 3 unspecified: Secondary | ICD-10-CM | POA: Diagnosis present

## 2016-04-08 DIAGNOSIS — Z955 Presence of coronary angioplasty implant and graft: Secondary | ICD-10-CM | POA: Insufficient documentation

## 2016-04-08 DIAGNOSIS — Z794 Long term (current) use of insulin: Secondary | ICD-10-CM | POA: Diagnosis not present

## 2016-04-08 DIAGNOSIS — I10 Essential (primary) hypertension: Secondary | ICD-10-CM | POA: Diagnosis not present

## 2016-04-08 DIAGNOSIS — I4891 Unspecified atrial fibrillation: Principal | ICD-10-CM | POA: Insufficient documentation

## 2016-04-08 DIAGNOSIS — Z79899 Other long term (current) drug therapy: Secondary | ICD-10-CM | POA: Insufficient documentation

## 2016-04-08 DIAGNOSIS — R071 Chest pain on breathing: Secondary | ICD-10-CM | POA: Diagnosis not present

## 2016-04-08 DIAGNOSIS — I209 Angina pectoris, unspecified: Secondary | ICD-10-CM | POA: Diagnosis not present

## 2016-04-08 DIAGNOSIS — I130 Hypertensive heart and chronic kidney disease with heart failure and stage 1 through stage 4 chronic kidney disease, or unspecified chronic kidney disease: Secondary | ICD-10-CM | POA: Insufficient documentation

## 2016-04-08 DIAGNOSIS — E11649 Type 2 diabetes mellitus with hypoglycemia without coma: Secondary | ICD-10-CM

## 2016-04-08 DIAGNOSIS — Z87891 Personal history of nicotine dependence: Secondary | ICD-10-CM | POA: Insufficient documentation

## 2016-04-08 DIAGNOSIS — E785 Hyperlipidemia, unspecified: Secondary | ICD-10-CM | POA: Diagnosis present

## 2016-04-08 DIAGNOSIS — R000 Tachycardia, unspecified: Secondary | ICD-10-CM | POA: Diagnosis not present

## 2016-04-08 DIAGNOSIS — I5022 Chronic systolic (congestive) heart failure: Secondary | ICD-10-CM | POA: Diagnosis not present

## 2016-04-08 DIAGNOSIS — I509 Heart failure, unspecified: Secondary | ICD-10-CM

## 2016-04-08 LAB — TROPONIN I
Troponin I: 0.04 ng/mL (ref <0.03)
Troponin I: 0.05 ng/mL (ref <0.03)
Troponin I: 0.15 ng/mL (ref <0.03)

## 2016-04-08 LAB — CBC WITH DIFFERENTIAL/PLATELET
Basophils Absolute: 0 10*3/uL (ref 0.0–0.1)
Basophils Relative: 0 %
Eosinophils Absolute: 0.6 10*3/uL (ref 0.0–0.7)
Eosinophils Relative: 6 %
HCT: 35.3 % — ABNORMAL LOW (ref 36.0–46.0)
Hemoglobin: 10.9 g/dL — ABNORMAL LOW (ref 12.0–15.0)
Lymphocytes Relative: 16 %
Lymphs Abs: 1.8 10*3/uL (ref 0.7–4.0)
MCH: 26.3 pg (ref 26.0–34.0)
MCHC: 30.9 g/dL (ref 30.0–36.0)
MCV: 85.3 fL (ref 78.0–100.0)
Monocytes Absolute: 0.8 10*3/uL (ref 0.1–1.0)
Monocytes Relative: 8 %
Neutro Abs: 7.7 10*3/uL (ref 1.7–7.7)
Neutrophils Relative %: 70 %
Platelets: 325 10*3/uL (ref 150–400)
RBC: 4.14 MIL/uL (ref 3.87–5.11)
RDW: 15.6 % — ABNORMAL HIGH (ref 11.5–15.5)
WBC: 10.9 10*3/uL — ABNORMAL HIGH (ref 4.0–10.5)

## 2016-04-08 LAB — BASIC METABOLIC PANEL
Anion gap: 11 (ref 5–15)
BUN: 20 mg/dL (ref 6–20)
CO2: 25 mmol/L (ref 22–32)
Calcium: 8.6 mg/dL — ABNORMAL LOW (ref 8.9–10.3)
Chloride: 107 mmol/L (ref 101–111)
Creatinine, Ser: 1.95 mg/dL — ABNORMAL HIGH (ref 0.44–1.00)
GFR calc Af Amer: 28 mL/min — ABNORMAL LOW (ref >60)
GFR calc non Af Amer: 24 mL/min — ABNORMAL LOW (ref >60)
Glucose, Bld: 152 mg/dL — ABNORMAL HIGH (ref 65–99)
Potassium: 4.1 mmol/L (ref 3.5–5.1)
Sodium: 143 mmol/L (ref 135–145)

## 2016-04-08 LAB — MAGNESIUM: Magnesium: 1.8 mg/dL (ref 1.7–2.4)

## 2016-04-08 LAB — GLUCOSE, CAPILLARY
Glucose-Capillary: 246 mg/dL — ABNORMAL HIGH (ref 65–99)
Glucose-Capillary: 290 mg/dL — ABNORMAL HIGH (ref 65–99)

## 2016-04-08 LAB — TSH: TSH: 1.684 u[IU]/mL (ref 0.350–4.500)

## 2016-04-08 MED ORDER — DILTIAZEM LOAD VIA INFUSION
15.0000 mg | Freq: Once | INTRAVENOUS | Status: AC
  Administered 2016-04-08: 15 mg via INTRAVENOUS
  Filled 2016-04-08: qty 15

## 2016-04-08 MED ORDER — METOPROLOL SUCCINATE ER 25 MG PO TB24
25.0000 mg | ORAL_TABLET | Freq: Two times a day (BID) | ORAL | Status: DC
  Administered 2016-04-08 – 2016-04-09 (×2): 25 mg via ORAL
  Filled 2016-04-08 (×4): qty 1

## 2016-04-08 MED ORDER — INSULIN ASPART 100 UNIT/ML ~~LOC~~ SOLN
12.0000 [IU] | Freq: Three times a day (TID) | SUBCUTANEOUS | Status: DC
  Administered 2016-04-09 – 2016-04-10 (×3): 12 [IU] via SUBCUTANEOUS

## 2016-04-08 MED ORDER — DIPHENHYDRAMINE HCL 12.5 MG/5ML PO ELIX
12.5000 mg | ORAL_SOLUTION | Freq: Once | ORAL | Status: AC
  Administered 2016-04-08: 12.5 mg via ORAL
  Filled 2016-04-08: qty 10

## 2016-04-08 MED ORDER — HEPARIN BOLUS VIA INFUSION
4000.0000 [IU] | Freq: Once | INTRAVENOUS | Status: AC
  Administered 2016-04-08: 4000 [IU] via INTRAVENOUS
  Filled 2016-04-08: qty 4000

## 2016-04-08 MED ORDER — ALBUTEROL SULFATE (2.5 MG/3ML) 0.083% IN NEBU: 2.5000 mg | INHALATION_SOLUTION | RESPIRATORY_TRACT | Status: DC | PRN

## 2016-04-08 MED ORDER — IRBESARTAN 150 MG PO TABS
150.0000 mg | ORAL_TABLET | Freq: Every day | ORAL | Status: DC
Start: 2016-04-08 — End: 2016-04-09
  Administered 2016-04-09: 150 mg via ORAL
  Filled 2016-04-08 (×2): qty 1

## 2016-04-08 MED ORDER — HEPARIN (PORCINE) IN NACL 100-0.45 UNIT/ML-% IJ SOLN
1000.0000 [IU]/h | INTRAMUSCULAR | Status: AC
  Administered 2016-04-08 – 2016-04-09 (×2): 1000 [IU]/h via INTRAVENOUS
  Filled 2016-04-08 (×2): qty 250

## 2016-04-08 MED ORDER — FUROSEMIDE 40 MG PO TABS
40.0000 mg | ORAL_TABLET | Freq: Two times a day (BID) | ORAL | Status: DC
  Administered 2016-04-08 – 2016-04-10 (×4): 40 mg via ORAL
  Filled 2016-04-08 (×4): qty 1

## 2016-04-08 MED ORDER — INSULIN DETEMIR 100 UNIT/ML ~~LOC~~ SOLN
30.0000 [IU] | SUBCUTANEOUS | Status: DC
  Administered 2016-04-09 – 2016-04-10 (×2): 30 [IU] via SUBCUTANEOUS
  Filled 2016-04-08 (×2): qty 0.3

## 2016-04-08 MED ORDER — SODIUM CHLORIDE 0.9 % IV BOLUS (SEPSIS)
500.0000 mL | Freq: Once | INTRAVENOUS | Status: AC
  Administered 2016-04-08: 500 mL via INTRAVENOUS

## 2016-04-08 MED ORDER — DILTIAZEM HCL 100 MG IV SOLR
5.0000 mg/h | INTRAVENOUS | Status: DC
  Administered 2016-04-08: 5 mg/h via INTRAVENOUS
  Filled 2016-04-08: qty 100

## 2016-04-08 MED ORDER — TRAMADOL HCL 50 MG PO TABS
50.0000 mg | ORAL_TABLET | Freq: Two times a day (BID) | ORAL | Status: DC
  Administered 2016-04-08 – 2016-04-10 (×3): 50 mg via ORAL
  Filled 2016-04-08 (×4): qty 1

## 2016-04-08 MED ORDER — LATANOPROST 0.005 % OP SOLN
1.0000 [drp] | Freq: Every day | OPHTHALMIC | Status: DC
  Administered 2016-04-08 – 2016-04-09 (×2): 1 [drp] via OPHTHALMIC
  Filled 2016-04-08 (×2): qty 2.5

## 2016-04-08 MED ORDER — SODIUM CHLORIDE 0.9 % IV SOLN
INTRAVENOUS | Status: DC
  Administered 2016-04-08: 11:00:00 via INTRAVENOUS

## 2016-04-08 MED ORDER — ATORVASTATIN CALCIUM 80 MG PO TABS
80.0000 mg | ORAL_TABLET | Freq: Every day | ORAL | Status: DC
  Administered 2016-04-08 – 2016-04-09 (×2): 80 mg via ORAL
  Filled 2016-04-08 (×3): qty 1

## 2016-04-08 MED ORDER — DIPHENHYDRAMINE HCL 50 MG/ML IJ SOLN
12.5000 mg | Freq: Once | INTRAMUSCULAR | Status: AC
  Administered 2016-04-08: 12.5 mg via INTRAVENOUS
  Filled 2016-04-08: qty 1

## 2016-04-08 NOTE — Care Management Note (Signed)
Case Management Note  Patient Details  Name: Berenis Corter MRN: [id number] Date of Birth: Dec 17, 1940  Subjective/Objective:                  From home with children. /75 y.o. female with medical history significant of CHF, chronic end-stage kidney disease, diabetes, HTN,, COPD, toxic metabolic encephalopathy with hospitalization in January 2018 who presented to the emergency room viaEMS that brought her from the PCP office. She was seen today in follow up after her discharge from the hospital and was found to have AFib with RVR on EKG.  Action/Plan: Admit status OBSERVATION (AFIB WITH RVR); anticipate discharge [facility].   Expected Discharge Date:   (unsure)               Expected Discharge Plan:  [place]  In-House Referral:  NA  Discharge planning Services  CM Consult  Post Acute Care Choice:    Choice offered to:     DME Arranged:    DME Agency:     HH Arranged:    HH Agency:     Status of Service:  In process, will continue to follow  If discussed at Long Length of Stay Meetings, dates discussed:    Additional Comments:  Fuller Mandril, RN 04/08/2016, 2:56 PM

## 2016-04-08 NOTE — ED Triage Notes (Signed)
Pt here from MD office with c/o chest pain  And afib , pain seems to come and go as pt goes in and out of a fib

## 2016-04-08 NOTE — Progress Notes (Signed)
Critical troponin 0.15 reported to NP Baltazar Najjar.

## 2016-04-08 NOTE — ED Notes (Signed)
Pt back into SR , Cardizem gtt stopped , Toprol given po , EKG done

## 2016-04-08 NOTE — Progress Notes (Signed)
[place] for heparin Indication: atrial fibrillation  Allergies  Allergen Reactions  . Eggs Or Egg-Derived Products Nausea And Vomiting  . Lisinopril Nausea And Vomiting  . Penicillins Nausea And Vomiting     Vital Signs: Temp: 98.6 F (37 C) (02/15 1035) Temp Source: Oral (02/15 1035) BP: 111/56 (02/15 1330) Pulse Rate: 87 (02/15 1215)  Labs:  Recent Labs  04/06/16 0309 04/08/16 1102  HGB 10.2* 10.9*  HCT 33.6* 35.3*  PLT 272 325  CREATININE 2.04* 1.95*  TROPONINI  --  0.04*    Estimated Creatinine Clearance: 28.1 mL/min (by C-G formula based on SCr of 1.95 mg/dL (H)).   Medical History: Past Medical History:  Diagnosis Date  . Arthritis   . Asthma   . CHF (congestive heart failure) ([place])   . CKD (chronic kidney disease), stage III   . COPD (chronic obstructive pulmonary disease) ([place])   . Diabetes mellitus    INSULIN DEPENDENT  . Gout   . Hyperlipemia   . Hypertension   . Psoriasis   . Renal disorder    congenital  . Single kidney     Assessment: 76 yo female to start heparin for atrial fibrillation. No known anticoagulation prior to arrival. CBC stable.  Goal of Therapy:  Heparin level 0.3-0.7 units/ml Monitor platelets by anticoagulation protocol: Yes   Plan:  1. Give 4000 units bolus x 1 2. Start heparin infusion at 1000 units/hr 3. Check anti-Xa level in 8 hours and daily while on heparin 4. Continue to monitor H&H and platelets   Vincenza Hews, PharmD, BCPS 04/08/2016, 2:51 PM

## 2016-04-08 NOTE — H&P (Signed)
History and Physical    [name] TUU:[id number] DOB: 1940-02-23 DOA: 04/08/2016  PCP: Donnie Coffin, MD   Patient coming from: PCP office  Chief Complaint: palpitations and chest pain  HPI: Stephanie Woodward is a 76 y.o. female with medical history significant of CHF, chronic end-stage kidney disease, diabetes, HTN,, COPD, toxic metabolic encephalopathy with hospitalization in January 2018 who presented to the emergency room via EMS that brought her from the PCP office. She was seen today in follow up after her discharge from the hospital and was found to have AFib with RVR on EKG.  ED Course: on arrival VS were normal except accelerated heart rate to 166 bpm and patient was placed on IV Cardizem drip Her blood work revealed improved creatinine (2.04-->1.95), stable anemia with improved Hg (10.2--> 1.9) and slightly elevated troponin 0.04 EKG showed rapid AFib Chest XRay without significant changes of stable cardiomegaly and vascular congestion   Review of Systems: As per HPI otherwise 10 point review of systems negative.   Ambulatory Status: independent  Past Medical History:  Diagnosis Date  . Arthritis   . Asthma   . CHF (congestive heart failure) ([place])   . CKD (chronic kidney disease), stage III   . COPD (chronic obstructive pulmonary disease) ([place])   . Diabetes mellitus    INSULIN DEPENDENT  . Gout   . Hyperlipemia   . Hypertension   . Psoriasis   . Renal disorder    congenital  . Single kidney     Past Surgical History:  Procedure Laterality Date  . ABDOMINAL HYSTERECTOMY    . CHOLECYSTECTOMY    . fibroid tumor    . gallbladder  1978  . LEFT AND RIGHT HEART CATHETERIZATION WITH CORONARY ANGIOGRAM N/A 11/29/2013   Procedure: LEFT AND RIGHT HEART CATHETERIZATION WITH CORONARY ANGIOGRAM;  Surgeon: Jacolyn Reedy, MD;  Location: [facility] CATH LAB;  Service: Cardiovascular;  Laterality: N/A;    Social History   Social History  . Marital status: Widowed    Spouse  name: N/A  . Number of children: 2  . Years of education: N/A   Occupational History  . retired    Social History Main Topics  . Smoking status: Former Smoker    Packs/day: 1.00    Years: 20.00    Types: Cigarettes    Quit date: 02/22/2009  . Smokeless tobacco: Never Used  . Alcohol use No     Comment: quit 2011  . Drug use: No  . Sexual activity: No   Other Topics Concern  . Not on file   Social History Narrative  . No narrative on file    Allergies  Allergen Reactions  . Eggs Or Egg-Derived Products Nausea And Vomiting  . Lisinopril Nausea And Vomiting  . Penicillins Nausea And Vomiting    Family History  Problem Relation Age of Onset  . Lupus Daughter   . Cancer Mother     Prior to Admission medications   Medication Sig Start Date End Date Taking? Authorizing Provider  albuterol (PROVENTIL HFA;VENTOLIN HFA) 108 (90 BASE) MCG/ACT inhaler Inhale 2 puffs into the lungs every 4 (four) hours as needed for shortness of breath.     Historical Provider, MD  atorvastatin (LIPITOR) 80 MG tablet Take 80 mg by mouth daily.    Historical Provider, MD  furosemide (LASIX) 40 MG tablet Take 1 tablet (40 mg total) by mouth 2 (two) times daily. 04/06/16   Geradine Girt, DO  insulin aspart (NOVOLOG) 100 UNIT/ML  injection Inject 12 Units into the skin 3 (three) times daily before meals. If sugar is over 200 Patient taking differently: Inject 12 Units into the skin 3 (three) times daily as needed for high blood sugar. If sugar is over 200, takes 12 units as needed 03/11/16   Nita Sells, MD  insulin detemir (LEVEMIR) 100 UNIT/ML injection Inject 0.1 mLs (10 Units total) into the skin every morning. 04/06/16   Geradine Girt, DO  latanoprost (XALATAN) 0.005 % ophthalmic solution Place 1 drop into both eyes at bedtime.    Historical Provider, MD  metoprolol tartrate (LOPRESSOR) 25 MG tablet Take 25 mg by mouth 2 (two) times daily.    Historical Provider, MD  sitaGLIPtin (JANUVIA) 25  MG tablet Take 12.5 mg by mouth daily. Takes 1/2 tab    Historical Provider, MD  telmisartan (MICARDIS) 80 MG tablet Take 80 mg by mouth daily.    Historical Provider, MD  traMADol (ULTRAM) 50 MG tablet Take 50 mg by mouth 2 (two) times daily.    Historical Provider, MD  triamcinolone cream (KENALOG) 0.1 % Apply 1 application topically 2 (two) times daily.    Historical Provider, MD    Physical Exam: Vitals:   04/08/16 1115 04/08/16 1130 04/08/16 1145 04/08/16 1215  BP: 134/64 116/65 121/96 102/61  Pulse: (!) 166 102 (!) 142 87  Resp: 16 23 19 15   Temp:      TempSrc:      SpO2: 96% 97% 95% 96%     General: Appears calm and comfortable Eyes: PERRLA, EOMI, normal lids, iris ENT:  grossly normal hearing, lips & tongue, mucous membranes moist and intact Neck: no lymphoadenopathy, masses or thyromegaly Cardiovascular: accelerated, irregular S1S2, no m/r/g. No JVD, carotid bruits. No LE edema.  Respiratory: bilateral no wheezes, rales, rhonchi or cracles. Normal respiratory effort. No accessory muscle use observed Abdomen: soft, non-tender, non-distended, no organomegaly or masses appreciated. BS present in all quadrants Skin: no rash, ulcers or induration seen on limited exam Musculoskeletal: grossly normal tone BUE/BLE, good ROM, no bony abnormality or joint deformities observed Psychiatric: grossly normal mood and affect, speech fluent and appropriate, alert and oriented x3 Neurologic: CN II-XII grossly intact, moves all extremities in coordinated fashion, sensation intact  Labs on Admission: I have personally reviewed following labs and imaging studies  CBC, BMP  GFR: Estimated Creatinine Clearance: 28.1 mL/min (by C-G formula based on SCr of 1.95 mg/dL (H)).   Creatinine Clearance: Estimated Creatinine Clearance: 28.1 mL/min (by C-G formula based on SCr of 1.95 mg/dL (H)).    Radiological Exams on Admission: Dg Chest Port 1 View  Result Date: 04/08/2016 CLINICAL DATA:   Chest pain today EXAM: PORTABLE CHEST 1 VIEW COMPARISON:  04/04/2016 FINDINGS: The heart remains moderately enlarged. Vascular congestion is stable. Scattered bilateral mid and lower lung zone atelectasis persists. No pneumothorax. No pulmonary edema. IMPRESSION: Stable cardiomegaly and vascular congestion. Electronically Signed   By: Marybelle Killings M.D.   On: 04/08/2016 11:27    EKG: Independently reviewed - Afib with RVR, no acute ischemic changes   Assessment/Plan Principal Problem:   Atrial fibrillation with RVR (HCC) Active Problems:   Kidney disease, chronic, stage III (GFR 30-59 ml/min)   CHF (congestive heart failure) (HCC)   Essential hypertension   Hyperlipidemia   Diabetes mellitus type 2, controlled ([place])    Afib with RVR - continue Cardizem drip Continue cycle cardiac enzymes, monitor on telemetry Echo from 04/05/16 showed EF 98-92%, grade 2 diastolic dysfunction, normal  regional wall motion abnormality Cardiology consult requested by EDP CHADS 2 Vasc score is 3 with 5.6%YBWL of thromboembolic event and will require anticoagulation therapy  DM type II with Hgb A1c 8.8% on 03/10/2016 Hold oral antidiabetic meds, add SSI, monitor CBG's, maintain on Carb modified diet  CKD stage III Her creatinine is at baseline right now Continue to monitor and avoid nephrotoxic drugs  Hypertension - currently stable Continue home medication and adjust the doses if needed depending on the BP readings  Hyperlipidemia  Continue statin therapy and adjust the doses if needed   DVT prophylaxis:Heaprin Code Status: full Family Communication: at bedside Disposition Plan: telemetry Consults called: cardiology by EDP Admission status: observation   York Grice, PA-C Pager: [phone] [facility]  If 7PM-7AM, please contact night-coverage [email or web address] Password [email or web address]  04/08/2016, 1:11 PM

## 2016-04-08 NOTE — ED Provider Notes (Signed)
[facility] DEPT Provider Note   CSN: [id number] Arrival date & time: 04/08/16  1024     History   Chief Complaint Chief Complaint  Patient presents with  . Atrial Fibrillation  . Chest Pain    HPI Stephanie Woodward is a 76 y.o. female.  Patient presents for intermittent chest pressure, which recurred last night and was present, last weekend when she was admitted. She denies recent fever, chills, nausea, vomiting, weakness or dizziness. Went to see her PCP today and the chest discomfort occurred when she walked into the office. Her PCP ordered an EKG which showed rapid atrial fibrillation. EMS was contacted and they found the patient in a normal sinus rhythm. Upon arrival here, her pressure in the chest. Recurred and she was in rapid atrial fibrillation. Patient has never had a prior diagnosis of atrial fibrillation. She has a history of congestive heart failure, but does not  Have hx of a myocardial infarction. There are no other known modifying factors.  HPI  Past Medical History:  Diagnosis Date  . Arthritis   . Asthma   . CHF (congestive heart failure) ([place])   . CKD (chronic kidney disease), stage III   . COPD (chronic obstructive pulmonary disease) ([place])   . Diabetes mellitus    INSULIN DEPENDENT  . Gout   . Hyperlipemia   . Hypertension   . Psoriasis   . Renal disorder    congenital  . Single kidney     Patient Active Problem List   Diagnosis Date Noted  . Chest pain 04/04/2016  . Acute on chronic renal failure ([place]) 03/09/2016  . Diabetes mellitus with complication ([place]) 81/27/5170  . Foot pain, bilateral 03/09/2016  . Renal disorder   . AKI (acute kidney injury) ([place])   . Bradycardia   . CHF (congestive heart failure) ([place]) 02/06/2014  . Cellulitis of right lower extremity 02/06/2014  . Essential hypertension 02/06/2014  . Hyperlipidemia 02/06/2014  . Diabetes mellitus type 2, controlled ([place]) 02/06/2014  . Cellulitis 02/06/2014  . OSA (obstructive  sleep apnea) 01/22/2014  . Hypoxemia 12/07/2013  . Type 2 diabetes mellitus, controlled, with renal complications ([place]) 01/74/9449  . Kidney disease, chronic, stage III (GFR 30-59 ml/min) 12/07/2013  . Pulmonary hypertension 12/07/2013  . Chronic systolic congestive heart failure ([place])   . Acute on chronic diastolic CHF (congestive heart failure), NYHA class 1 ([place]) 12/06/2013  . CHF exacerbation ([place]) 12/05/2013  . SOB (shortness of breath) 12/05/2013  . Gout 11/11/2009  . Obesity 11/11/2009  . Hypertensive heart disease     Past Surgical History:  Procedure Laterality Date  . ABDOMINAL HYSTERECTOMY    . CHOLECYSTECTOMY    . fibroid tumor    . gallbladder  1978  . LEFT AND RIGHT HEART CATHETERIZATION WITH CORONARY ANGIOGRAM N/A 11/29/2013   Procedure: LEFT AND RIGHT HEART CATHETERIZATION WITH CORONARY ANGIOGRAM;  Surgeon: Jacolyn Reedy, MD;  Location: [facility] C[facility] LAB;  Service: Cardiovascular;  Laterality: N/A;    OB History    No data available       Home Medications    Prior to Admission medications   Medication Sig Start Date End Date Taking? Authorizing Provider  albuterol (PROVENTIL HFA;VENTOLIN HFA) 108 (90 BASE) MCG/ACT inhaler Inhale 2 puffs into the lungs every 4 (four) hours as needed for shortness of breath.     Historical Provider, MD  atorvastatin (LIPITOR) 80 MG tablet Take 80 mg by mouth daily.    Historical Provider, MD  furosemide (LASIX)  40 MG tablet Take 1 tablet (40 mg total) by mouth 2 (two) times daily. 04/06/16   Geradine Girt, DO  insulin aspart (NOVOLOG) 100 UNIT/ML injection Inject 12 Units into the skin 3 (three) times daily before meals. If sugar is over 200 Patient taking differently: Inject 12 Units into the skin 3 (three) times daily as needed for high blood sugar. If sugar is over 200, takes 12 units as needed 03/11/16   Nita Sells, MD  insulin detemir (LEVEMIR) 100 UNIT/ML injection Inject 0.1 mLs (10 Units total) into the skin every  morning. 04/06/16   Geradine Girt, DO  latanoprost (XALATAN) 0.005 % ophthalmic solution Place 1 drop into both eyes at bedtime.    Historical Provider, MD  metoprolol tartrate (LOPRESSOR) 25 MG tablet Take 25 mg by mouth 2 (two) times daily.    Historical Provider, MD  sitaGLIPtin (JANUVIA) 25 MG tablet Take 12.5 mg by mouth daily. Takes 1/2 tab    Historical Provider, MD  telmisartan (MICARDIS) 80 MG tablet Take 80 mg by mouth daily.    Historical Provider, MD  traMADol (ULTRAM) 50 MG tablet Take 50 mg by mouth 2 (two) times daily.    Historical Provider, MD  triamcinolone cream (KENALOG) 0.1 % Apply 1 application topically 2 (two) times daily.    Historical Provider, MD    Family History Family History  Problem Relation Age of Onset  . Lupus Daughter   . Cancer Mother     Social History Social History  Substance Use Topics  . Smoking status: Former Smoker    Packs/day: 1.00    Years: 20.00    Types: Cigarettes    Quit date: 02/22/2009  . Smokeless tobacco: Never Used  . Alcohol use No     Comment: quit 2011     Allergies   Eggs or egg-derived products; Lisinopril; and Penicillins   Review of Systems Review of Systems  All other systems reviewed and are negative.    Physical Exam Updated Vital Signs BP 127/62   Pulse (!) 145   Temp 98.6 F (37 C) (Oral)   Resp 17   SpO2 96%   Physical Exam  Constitutional: She is oriented to person, place, and time. She appears well-developed. No distress.  Elderly, obese  HENT:  Head: Normocephalic and atraumatic.  Eyes: Conjunctivae and EOM are normal. Pupils are equal, round, and reactive to light.  Neck: Normal range of motion and phonation normal. Neck supple.  Cardiovascular:  Irregular tachycardia  Pulmonary/Chest: Effort normal and breath sounds normal. She exhibits no tenderness.  Abdominal: Soft. She exhibits no distension. There is no tenderness. There is no guarding.  Musculoskeletal: Normal range of motion. She  exhibits no edema.  Chronic skin changes of lower extremities consistent with vascular insufficiency.  Neurological: She is alert and oriented to person, place, and time. She exhibits normal muscle tone.  Skin: Skin is warm and dry.  Psychiatric: She has a normal mood and affect. Her behavior is normal. Judgment and thought content normal.  Nursing note and vitals reviewed.    ED Treatments / Results  Labs (all labs ordered are listed, but only abnormal results are displayed) Labs Reviewed  BASIC METABOLIC PANEL - Abnormal; Notable for the following:       Result Value   Glucose, Bld 152 (*)    Creatinine, Ser 1.95 (*)    Calcium 8.6 (*)    GFR calc non Af Amer 24 (*)    GFR  calc Af Amer 28 (*)    All other components within normal limits  CBC WITH DIFFERENTIAL/PLATELET - Abnormal; Notable for the following:    WBC 10.9 (*)    Hemoglobin 10.9 (*)    HCT 35.3 (*)    RDW 15.6 (*)    All other components within normal limits  TROPONIN I - Abnormal; Notable for the following:    Troponin I 0.04 (*)    All other components within normal limits    EKG  EKG Interpretation  Date/Time:  Thursday April 08 2016 10:34:45 EST Ventricular Rate:  164 PR Interval:    QRS Duration: 112 QT Interval:  328 QTC Calculation: 542 R Axis:   -106 Text Interpretation:  Atrial fibrillation with rapid V-rate Inferior infarct, old Anterior infarct, old Baseline wander in lead(s) I II aVR Since last tracing Atrial fibrillation is new, and rate faster Confirmed by Eulis Foster  MD, Daleysa Kristiansen [phone]) on 04/08/2016 12:26:28 PM       Radiology Dg Chest Port 1 View  Result Date: 04/08/2016 CLINICAL DATA:  Chest pain today EXAM: PORTABLE CHEST 1 VIEW COMPARISON:  04/04/2016 FINDINGS: The heart remains moderately enlarged. Vascular congestion is stable. Scattered bilateral mid and lower lung zone atelectasis persists. No pneumothorax. No pulmonary edema. IMPRESSION: Stable cardiomegaly and vascular congestion.  Electronically Signed   By: Marybelle Killings M.D.   On: 04/08/2016 11:27    Procedures Procedures (including critical care time)  Medications Ordered in ED Medications  0.9 %  sodium chloride infusion ( Intravenous New Bag/Given 04/08/16 1107)  diltiazem (CARDIZEM) 1 mg/mL load via infusion 15 mg (15 mg Intravenous Bolus from Bag 04/08/16 1154)    And  diltiazem (CARDIZEM) 100 mg in dextrose 5 % 100 mL (1 mg/mL) infusion (5 mg/hr Intravenous New Bag/Given 04/08/16 1155)  sodium chloride 0.9 % bolus 500 mL (0 mLs Intravenous Stopped 04/08/16 1143)  diphenhydrAMINE (BENADRYL) injection 12.5 mg (12.5 mg Intravenous Given 04/08/16 1150)    Medications  0.9 %  sodium chloride infusion ( Intravenous New Bag/Given 04/08/16 1107)  diltiazem (CARDIZEM) 1 mg/mL load via infusion 15 mg (15 mg Intravenous Bolus from Bag 04/08/16 1154)    And  diltiazem (CARDIZEM) 100 mg in dextrose 5 % 100 mL (1 mg/mL) infusion (5 mg/hr Intravenous New Bag/Given 04/08/16 1155)  sodium chloride 0.9 % bolus 500 mL (0 mLs Intravenous Stopped 04/08/16 1143)  diphenhydrAMINE (BENADRYL) injection 12.5 mg (12.5 mg Intravenous Given 04/08/16 1150)    Patient Vitals for the past 24 hrs:  BP Temp Temp src Pulse Resp SpO2  04/08/16 1114 - - - (!) 145 17 96 %  04/08/16 1100 127/62 - - 104 22 95 %  04/08/16 1035 125/96 98.6 F (37 C) Oral (!) 158 17 98 %    This patients CHA2DS2-VASc Score and unadjusted Ischemic Stroke Rate (% per year) is equal to 7.2 % stroke rate/year from a score of 5  Above score calculated as 1 point each if present [CHF, HTN, DM, Vascular=MI/PAD/Aortic Plaque, Age if 65-74, or Female] Above score calculated as 2 points each if present [Age > 75, or Stroke/TIA/TE]    12:27 PM Reevaluation with update and discussion. After initial assessment and treatment, an updated evaluation reveals She remains comfortable. Heart rate improved with reassuring blood pressure. She remains on the diltiazem drip. Patient  family updated on findings and plan. Kelleen Stolze L   12:31 PM-Consult complete with hospitalist. Patient case explained and discussed. She agrees to admit patient for further  evaluation and treatment. Call ended at 21   Consult Cardiology - will see as consultant  CRITICAL CARE Performed by: Richarda Blade Total critical care time: 45 minutes Critical care time was exclusive of separately billable procedures and treating other patients. Critical care was necessary to treat or prevent imminent or life-threatening deterioration. Critical care was time spent personally by me on the following activities: development of treatment plan with patient and/or surrogate as well as nursing, discussions with consultants, evaluation of patient's response to treatment, examination of patient, obtaining history from patient or surrogate, ordering and performing treatments and interventions, ordering and review of laboratory studies, ordering and review of radiographic studies, pulse oximetry and re-evaluation of patient's condition.  Initial Impression / Assessment and Plan / ED Course  I have reviewed the triage vital signs and the nursing notes.  Pertinent labs & imaging results that were available during my care of the patient were reviewed by me and considered in my medical decision making (see chart for details).       Final Clinical Impressions(s) / ED Diagnoses   Final diagnoses:  Atrial fibrillation with rapid ventricular response ([place])   Chest discomfort with new onset atrial fibrillation, rapid ventricular response. Doubt ACS, PE or pneumonia. Recent cardiac echo, earlier this month, did not show significant altered ejection fraction but did show mildly dilated left atrium, and mild diastolic dysfunction. Patient will need to be admitted for stabilization. She will likely benefit from cardiology consultation.  Nursing Notes Reviewed/ Care Coordinated, and agree without  changes. Applicable Imaging Reviewed.  Interpretation of Laboratory Data incorporated into ED treatment  Plan: Admit   New Prescriptions New Prescriptions   No medications on file     Daleen Bo, MD 04/08/16 1342

## 2016-04-09 ENCOUNTER — Encounter (HOSPITAL_COMMUNITY): Payer: Self-pay | Admitting: Cardiology

## 2016-04-09 DIAGNOSIS — I209 Angina pectoris, unspecified: Secondary | ICD-10-CM | POA: Diagnosis not present

## 2016-04-09 DIAGNOSIS — Z794 Long term (current) use of insulin: Secondary | ICD-10-CM

## 2016-04-09 DIAGNOSIS — I480 Paroxysmal atrial fibrillation: Secondary | ICD-10-CM | POA: Diagnosis not present

## 2016-04-09 DIAGNOSIS — I4891 Unspecified atrial fibrillation: Secondary | ICD-10-CM | POA: Diagnosis not present

## 2016-04-09 DIAGNOSIS — I5022 Chronic systolic (congestive) heart failure: Secondary | ICD-10-CM | POA: Diagnosis not present

## 2016-04-09 DIAGNOSIS — I10 Essential (primary) hypertension: Secondary | ICD-10-CM

## 2016-04-09 DIAGNOSIS — E1122 Type 2 diabetes mellitus with diabetic chronic kidney disease: Secondary | ICD-10-CM | POA: Diagnosis not present

## 2016-04-09 DIAGNOSIS — N183 Chronic kidney disease, stage 3 (moderate): Secondary | ICD-10-CM

## 2016-04-09 LAB — CBC
HCT: 31.9 % — ABNORMAL LOW (ref 36.0–46.0)
Hemoglobin: 9.6 g/dL — ABNORMAL LOW (ref 12.0–15.0)
MCH: 25.7 pg — ABNORMAL LOW (ref 26.0–34.0)
MCHC: 30.1 g/dL (ref 30.0–36.0)
MCV: 85.3 fL (ref 78.0–100.0)
Platelets: 320 10*3/uL (ref 150–400)
RBC: 3.74 MIL/uL — ABNORMAL LOW (ref 3.87–5.11)
RDW: 16.1 % — ABNORMAL HIGH (ref 11.5–15.5)
WBC: 9.1 10*3/uL (ref 4.0–10.5)

## 2016-04-09 LAB — GLUCOSE, CAPILLARY
Glucose-Capillary: 184 mg/dL — ABNORMAL HIGH (ref 65–99)
Glucose-Capillary: 202 mg/dL — ABNORMAL HIGH (ref 65–99)
Glucose-Capillary: 130 mg/dL — ABNORMAL HIGH (ref 65–99)
Glucose-Capillary: 73 mg/dL (ref 65–99)

## 2016-04-09 LAB — BASIC METABOLIC PANEL
Anion gap: 9 (ref 5–15)
Anion gap: 8 (ref 5–15)
BUN: 23 mg/dL — ABNORMAL HIGH (ref 6–20)
BUN: 19 mg/dL (ref 6–20)
CO2: 26 mmol/L (ref 22–32)
CO2: 28 mmol/L (ref 22–32)
Calcium: 8.2 mg/dL — ABNORMAL LOW (ref 8.9–10.3)
Calcium: 8.7 mg/dL — ABNORMAL LOW (ref 8.9–10.3)
Chloride: 104 mmol/L (ref 101–111)
Chloride: 106 mmol/L (ref 101–111)
Creatinine, Ser: 2.12 mg/dL — ABNORMAL HIGH (ref 0.44–1.00)
Creatinine, Ser: 1.79 mg/dL — ABNORMAL HIGH (ref 0.44–1.00)
GFR calc Af Amer: 25 mL/min — ABNORMAL LOW (ref >60)
GFR calc Af Amer: 31 mL/min — ABNORMAL LOW (ref >60)
GFR calc non Af Amer: 22 mL/min — ABNORMAL LOW (ref >60)
GFR calc non Af Amer: 27 mL/min — ABNORMAL LOW (ref >60)
Glucose, Bld: 265 mg/dL — ABNORMAL HIGH (ref 65–99)
Glucose, Bld: 134 mg/dL — ABNORMAL HIGH (ref 65–99)
Potassium: 4.3 mmol/L (ref 3.5–5.1)
Potassium: 4 mmol/L (ref 3.5–5.1)
Sodium: 139 mmol/L (ref 135–145)
Sodium: 142 mmol/L (ref 135–145)

## 2016-04-09 LAB — HEPARIN LEVEL (UNFRACTIONATED)
Heparin Unfractionated: 0.42 IU/mL (ref 0.30–0.70)
Heparin Unfractionated: 0.52 IU/mL (ref 0.30–0.70)

## 2016-04-09 LAB — TROPONIN I: Troponin I: 0.2 ng/mL (ref <0.03)

## 2016-04-09 MED ORDER — DIPHENHYDRAMINE HCL 25 MG PO CAPS
25.0000 mg | ORAL_CAPSULE | Freq: Three times a day (TID) | ORAL | Status: DC | PRN
  Administered 2016-04-09 – 2016-04-10 (×2): 25 mg via ORAL
  Filled 2016-04-09 (×2): qty 1

## 2016-04-09 MED ORDER — RIVAROXABAN 15 MG PO TABS
15.0000 mg | ORAL_TABLET | Freq: Every day | ORAL | Status: DC
  Administered 2016-04-09: 15 mg via ORAL
  Filled 2016-04-09: qty 1

## 2016-04-09 MED ORDER — IRBESARTAN 75 MG PO TABS
75.0000 mg | ORAL_TABLET | Freq: Every day | ORAL | Status: DC
  Administered 2016-04-10: 75 mg via ORAL
  Filled 2016-04-09: qty 1

## 2016-04-09 MED ORDER — METOPROLOL SUCCINATE ER 50 MG PO TB24
50.0000 mg | ORAL_TABLET | Freq: Two times a day (BID) | ORAL | Status: DC
  Administered 2016-04-09 – 2016-04-10 (×2): 50 mg via ORAL
  Filled 2016-04-09 (×2): qty 1

## 2016-04-09 MED ORDER — INSULIN ASPART 100 UNIT/ML ~~LOC~~ SOLN
0.0000 [IU] | Freq: Three times a day (TID) | SUBCUTANEOUS | Status: DC
  Administered 2016-04-09: 3 [IU] via SUBCUTANEOUS
  Administered 2016-04-09: 1 [IU] via SUBCUTANEOUS
  Administered 2016-04-09: 2 [IU] via SUBCUTANEOUS
  Administered 2016-04-10: 3 [IU] via SUBCUTANEOUS

## 2016-04-09 MED ORDER — AMIODARONE HCL 200 MG PO TABS
200.0000 mg | ORAL_TABLET | Freq: Two times a day (BID) | ORAL | Status: DC
  Administered 2016-04-09 – 2016-04-10 (×2): 200 mg via ORAL
  Filled 2016-04-09 (×3): qty 1

## 2016-04-09 NOTE — Consult Note (Signed)
Cardiology Consult Note  Admit date: 04/08/2016 Name: Stephanie Woodward 76 y.o.  female DOB:  09/20/1940 MRN:  [id number]  Today's date:  04/09/2016  Referring Physician:    [facility]  Primary Physician:   Dr. Donnie Coffin  Reason for Consultation:    Abnormal troponin and atrial fibrillation  IMPRESSIONS: 1. Paroxysmal atrial fibrillation with CHA2DS2VASC score of 5 increasing her thromboembolic risk 2. Stage III-IV chronic kidney disease 3. Elevation of troponin likely due to demand ischemia in the setting of rapid atrial fibrillation 4. Type 2 diabetes mellitus insulin-dependent 5. Piece of the need to lose weight 6. Hypertensive heart disease 7. History of diastolic and systolic dysfunction 8. Left bundle-branch block 9. Anemia  RECOMMENDATION: I suspect she could be having some paroxysmal atrial fibrillation that could account for some of her symptoms. Recent admission was likely due to renal failure as well as inadequate Lasix dose. At this point it appears that she is back in sinus rhythm. She likely will need something for suppression. She also will need to be anticoagulated. I would recommend anticoagulation systemically but the dose and neck may need to be adjusted for renal function. In addition I believe she would be a candidate for amiodarone as this would not be affected by her renal clearance for her cardiac function. I would suggest going ahead and starting low-dose amiodarone. I would not evaluate the abnormal troponins at this point. Thank you for asking me to see her with you.  HISTORY: Patient is seen in cardiac consultation. She has been followed by me for several years and has a history of systolic and diastolic dysfunction. In 2015 she was evaluated and was not found any significant coronary artery disease. She has a dominant circumflex with a fairly small LAD and right coronary artery system. She has had catheterization in 2010 and 2015 but has basically been  unchanged. She is chronic kidney disease and has a solitary kidney. She was hospitalized in January with acute renal failure when she was placed on Bactrim and this gradually got better but she was discharged on a reduced dose of myocarditis as well as furosemide. She was readmitted earlier this week with some chest discomfort shortness of breath and a weight gain and at that point had an echocardiogram showing improvement in her left ventricular function with an EF of 50-55%. She had grade 2 diastolic dysfunction noted. She was discharged home. She has had some intermittent chest tightness. She developed chest tightness yesterday went to her primary care doctor is found to be in rapid atrial fibrillation. This is the first time she has been diagnosed with atrial fibrillation. Because of the atrial fibrillation she was admitted to the hospital yesterday. She has a known left bundle branch block. She converted to sinus rhythm last evening in her chest tightness is improved. Troponins became mildly elevated today. They are at a somewhat flat pattern.  Past Medical History:  Diagnosis Date  . Arthritis   . Asthma   . CHF (congestive heart failure) ([place])   . CKD (chronic kidney disease), stage III   . COPD (chronic obstructive pulmonary disease) ([place])   . Diabetes mellitus    INSULIN DEPENDENT  . Gout   . Hyperlipemia   . Hypertension   . Psoriasis   . Renal disorder    congenital  . Single kidney    Past Surgical History:  Procedure Laterality Date  . ABDOMINAL HYSTERECTOMY    . CHOLECYSTECTOMY    . LEFT AND RIGHT  HEART CATHETERIZATION WITH CORONARY ANGIOGRAM N/A 11/29/2013   Procedure: LEFT AND RIGHT HEART CATHETERIZATION WITH CORONARY ANGIOGRAM;  Surgeon: Jacolyn Reedy, MD;  Location: [facility] CATH LAB;  Service: Cardiovascular;  Laterality: N/A;     Allergies:  is allergic to eggs or egg-derived products; lisinopril; and penicillins.   Medications: Prior to Admission medications    Medication Sig Start Date End Date Taking? Authorizing Provider  albuterol (PROVENTIL HFA;VENTOLIN HFA) 108 (90 BASE) MCG/ACT inhaler Inhale 2 puffs into the lungs every 4 (four) hours as needed for shortness of breath.    Yes Historical Provider, MD  atorvastatin (LIPITOR) 80 MG tablet Take 80 mg by mouth daily.   Yes Historical Provider, MD  furosemide (LASIX) 40 MG tablet Take 1 tablet (40 mg total) by mouth 2 (two) times daily. 04/06/16  Yes Geradine Girt, DO  insulin aspart (NOVOLOG) 100 UNIT/ML injection Inject 12 Units into the skin 3 (three) times daily before meals. If sugar is over 200 Patient taking differently: Inject 12 Units into the skin 3 (three) times daily as needed for high blood sugar. If sugar is over 200, takes 12 units as needed 03/11/16  Yes Nita Sells, MD  insulin detemir (LEVEMIR) 100 UNIT/ML injection Inject 0.1 mLs (10 Units total) into the skin every morning. Patient taking differently: Inject 60 Units into the skin every morning.  04/06/16  Yes Jessica U Vann, DO  latanoprost (XALATAN) 0.005 % ophthalmic solution Place 1 drop into both eyes at bedtime.   Yes Historical Provider, MD  metoprolol succinate (TOPROL-XL) 25 MG 24 hr tablet Take 25 mg by mouth 2 (two) times daily.   Yes Historical Provider, MD  sitaGLIPtin (JANUVIA) 50 MG tablet Take 25 mg by mouth daily.   Yes Historical Provider, MD  telmisartan (MICARDIS) 80 MG tablet Take 80 mg by mouth daily.   Yes Historical Provider, MD  traMADol (ULTRAM) 50 MG tablet Take 50 mg by mouth 2 (two) times daily.   Yes Historical Provider, MD  triamcinolone cream (KENALOG) 0.1 % Apply 1 application topically 2 (two) times daily.   Yes Historical Provider, MD    Family History: Family Status  Relation Status  . Daughter   . Mother Deceased  . Father Deceased  . Sister Alive  . Brother Deceased   Social History:   reports that she quit smoking about 7 years ago. Her smoking use included Cigarettes. She has a  20.00 pack-year smoking history. She has never used smokeless tobacco. She reports that she does not drink alcohol or use drugs.   Review of Systems: Obese for many years unable to lose weight.  Chronic dyspnea.  Chronic back pain. Peripheral neuropathy.  Chronic venous disease with recent ulcers on right lower legs.   Other than as noted above, the remainder of the ROS is negative  Physical Exam: BP 111/65   Pulse (!) 58   Temp 98.6 F (37 C) (Oral)   Resp 13   Ht 5\' 3"  (1.6 m)   Wt 94.6 kg (208 lb 8 oz)   SpO2 98%   BMI 36.93 kg/m   General appearance: Pleasant BF in NAD Head: Normocephalic, without obvious abnormality, atraumatic Eyes: conjunctivae/corneas clear. PERRL, EOM's intact. Fundi not examined. Neck: no adenopathy, no carotid bruit, supple, symmetrical, trachea midline and JVD difficult to evaluate due to obesity Lungs: clear to auscultation bilaterally Heart: regular rate and rhythm, S1, S2 normal, S3 present and S4 present Abdomen: soft, non-tender; bowel sounds normal; no masses,  no organomegaly Pelvic: deferred Extremities: some bullous lesions on upper posterior right calf, 2+edema, left leg somewhat firm trace edema Pulses: 2+ and symmetric Skin: Skin color, texture, turgor normal. No rashes or lesions Neurologic: Grossly normal Psych: Alert and oriented x 3 Labs: CBC  Recent Labs  04/08/16 1102 04/09/16 0144  WBC 10.9* 9.1  RBC 4.14 3.74*  HGB 10.9* 9.6*  HCT 35.3* 31.9*  PLT 325 320  MCV 85.3 85.3  MCH 26.3 25.7*  MCHC 30.9 30.1  RDW 15.6* 16.1*  LYMPHSABS 1.8  --   MONOABS 0.8  --   EOSABS 0.6  --   BASOSABS 0.0  --    CMP   Recent Labs  04/09/16 0144  NA 139  K 4.3  CL 104  CO2 26  GLUCOSE 265*  BUN 23*  CREATININE 2.12*  CALCIUM 8.2*  GFRNONAA 22*  GFRAA 25*   BNP (last 3 results) BNP    Component Value Date/Time   BNP 407.5 (H) 04/04/2016 1437   Cardiac Panel (last 3 results)  Recent Labs  04/08/16 1411  04/08/16 1859 04/09/16 0144  TROPONINI 0.05* 0.15* 0.20*     Radiology:  Cardiomegaly, vascular congestion, atalectasis  EKG: Initial EKG atrial fib with RVR, LBBB, LAD, subsequent one shows sinus rhythm Independently reviewed by me  Signed:  W. Doristine Church MD [facility]   Cardiology Consultant  04/09/2016, 12:25 PM

## 2016-04-09 NOTE — Care Management Obs Status (Signed)
[place] NOTIFICATION   Patient Details  Name: Stephanie Woodward MRN: [id number] Date of Birth: Apr 23, 1940   Medicare Observation Status Notification Given:  Yes Case manager spoke with patient,  and with her daughter [name] via telephone prior to patient signing Obs Letter.   Ninfa Meeker, RN 04/09/2016, 4:47 PM

## 2016-04-09 NOTE — Progress Notes (Addendum)
PROGRESS NOTE        PATIENT DETAILS Name: Stephanie Woodward Age: 76 y.o. Sex: female Date of Birth: 1940/09/24 Admit Date: 04/08/2016 Admitting Physician Elwin Mocha, MD WIO:XBDZ Alroy Dust, MD  Brief Narrative: Patient is a 76 y.o. female with history of stage III chronic kidney disease, hypertension, type 2 diabetes, chronic diastolic heart failure admitted for evaluation of chest pain and palpitations. Found to have A. fib with RVR and subsequently admitted for further evaluation and treatment. See below for further details  Subjective: Of further chest pain. Heart rate better controlled-RRR and no longer on Cardizem infusion.  Assessment/Plan: Chest pain: Likely atypical, in a setting of A. fib with RVR. Troponin mildly elevated-most recent echocardiogram on/12 showed preserved EF without any wall motion abnormalities. Await cardiology evaluation to see if patient requires any further workup.  Atrial fibrillation with RVR: Rate better control-in fact appears to be in sinus rhythm this morning. Per RN and no longer requiring IV Cardizem, continue beta blocker.CHA2DS2VASC score of atleast 3-continue V heparin-if cardiology not planning on any invasive procedures-we will need to start Coumadin-likely not a candidate for new agents given her renal function.   Addendum Spoke with Pharmacy regarding NOAC-recommendations are for Xarelto.  Type 2 diabetes: CBGs stable with 30 units of Levemir, 12 and its of NovoLog before meals and SSI. Resume oral hypoglycemic agents on discharge.  Hypertension: Controlled, continue Toprol and Avapro.  Dyslipidemia: Continue statin  Chronic diastolic heart failure: Reasonably well compensated-continue Lasix and Toprol.  Chronic kidney disease stage III: Creatinine close to usual baseline, follow electrolytes periodically.  Chronic venous stasis: Stable-at baseline.  Morbid Obesity: Counseled regarding importance of weight  loss  OSA  DVT Prophylaxis: Full dose anticoagulation with Heparin  Code Status: Full code  Family Communication: None at bedside  Disposition Plan: Remain inpatient-home likely 2/17  Antimicrobial agents: Anti-infectives    None      Procedures: None  CONSULTS:  cardiology  Time spent: 25- minutes-Greater than 50% of this time was spent in counseling, explanation of diagnosis, planning of further management, and coordination of care.  MEDICATIONS: Scheduled Meds: . atorvastatin  80 mg Oral q1800  . furosemide  40 mg Oral BID  . insulin aspart  0-9 Units Subcutaneous TID WC  . insulin aspart  12 Units Subcutaneous TID AC  . insulin detemir  30 Units Subcutaneous BH-q7a  . [START ON 04/10/2016] irbesartan  75 mg Oral Daily  . latanoprost  1 drop Both Eyes QHS  . metoprolol succinate  50 mg Oral BID  . traMADol  50 mg Oral BID   Continuous Infusions: . diltiazem (CARDIZEM) infusion Stopped (04/08/16 1731)  . heparin 1,000 Units/hr (04/08/16 1527)   PRN Meds:.albuterol, diphenhydrAMINE   PHYSICAL EXAM: Vital signs: Vitals:   04/08/16 2057 04/09/16 0403 04/09/16 0500 04/09/16 0600  BP: 126/68 111/65    Pulse: (!) 56 (!) 58 62 (!) 58  Resp: 16 (!) 22 13 13   Temp: 98.3 F (36.8 C) 98.6 F (37 C)    TempSrc: Oral Oral    SpO2: 97% 97% 96% 98%  Weight:  94.6 kg (208 lb 8 oz)    Height:       Filed Weights   04/08/16 1740 04/09/16 0403  Weight: 94.6 kg (208 lb 9.8 oz) 94.6 kg (208 lb 8 oz)   Body mass index  is 36.93 kg/m.   General appearance :Awake, alert, not in any distress. Speech Clear. Not toxic Looking Eyes:, pupils equally reactive to light and accomodation,no scleral icterus. HEENT: Atraumatic and Normocephalic Neck: supple, no JVD. No cervical lymphadenopathy.  Resp:Good air entry bilaterally, no added sounds  CVS: S1 S2 regular GI: Bowel sounds present, Non tender and not distended with no gaurding, rigidity or rebound.No  organomegaly Extremities: Right more than left chronic venous stasis dermatitis changes. Neurology:  speech clear,Non focal, sensation is grossly intact. Psychiatric: Normal judgment and insight. Alert and oriented x 3. Normal mood. Musculoskeletal:No digital cyanosis Skin:No Rash, warm and dry Wounds:N/A  I have personally reviewed following labs and imaging studies  LABORATORY DATA: CBC:  Recent Labs Lab 04/04/16 1437 04/05/16 0929 04/06/16 0309 04/08/16 1102 04/09/16 0144  WBC 10.4 10.8* 9.1 10.9* 9.1  NEUTROABS  --   --   --  7.7  --   HGB 10.4* 11.0* 10.2* 10.9* 9.6*  HCT 33.6* 35.7* 33.6* 35.3* 31.9*  MCV 84.8 84.8 85.7 85.3 85.3  PLT 266 304 272 325 938    Basic Metabolic Panel:  Recent Labs Lab 04/04/16 1437 04/05/16 0929 04/06/16 0309 04/08/16 1102 04/08/16 1411 04/09/16 0144  NA 142 142 138 143  --  139  K 4.2 4.1 4.1 4.1  --  4.3  CL 109 106 104 107  --  104  CO2 28 27 26 25   --  26  GLUCOSE 198* 153* 124* 152*  --  265*  BUN 22* 25* 24* 20  --  23*  CREATININE 2.00* 2.07* 2.04* 1.95*  --  2.12*  CALCIUM 8.7* 8.7* 8.2* 8.6*  --  8.2*  MG  --   --   --   --  1.8  --     GFR: Estimated Creatinine Clearance: 25.9 mL/min (by C-G formula based on SCr of 2.12 mg/dL (H)).  Liver Function Tests:  Recent Labs Lab 04/06/16 0309  AST 13*  ALT 13*  ALKPHOS 99  BILITOT 0.5  PROT 5.5*  ALBUMIN 2.3*   No results for input(s): LIPASE, AMYLASE in the last 168 hours. No results for input(s): AMMONIA in the last 168 hours.  Coagulation Profile: No results for input(s): INR, PROTIME in the last 168 hours.  Cardiac Enzymes:  Recent Labs Lab 04/05/16 0007 04/08/16 1102 04/08/16 1411 04/08/16 1859 04/09/16 0144  TROPONINI 0.06* 0.04* 0.05* 0.15* 0.20*    BNP (last 3 results) No results for input(s): PROBNP in the last 8760 hours.  HbA1C: No results for input(s): HGBA1C in the last 72 hours.  CBG:  Recent Labs Lab 04/06/16 0603  04/06/16 1141 04/08/16 1749 04/08/16 2231 04/09/16 0724  GLUCAP 116* 287* 246* 290* 184*    Lipid Profile: No results for input(s): CHOL, HDL, LDLCALC, TRIG, CHOLHDL, LDLDIRECT in the last 72 hours.  Thyroid Function Tests:  Recent Labs  04/08/16 1411  TSH 1.684    Anemia Panel: No results for input(s): VITAMINB12, FOLATE, FERRITIN, TIBC, IRON, RETICCTPCT in the last 72 hours.  Urine analysis:    Component Value Date/Time   COLORURINE STRAW (A) 03/09/2016 0648   APPEARANCEUR CLEAR 03/09/2016 0648   LABSPEC 1.009 03/09/2016 0648   PHURINE 5.0 03/09/2016 [place] 03/09/2016 0648   HGBUR NEGATIVE 03/09/2016 0648   BILIRUBINUR NEGATIVE 03/09/2016 [place] 03/09/2016 0648   PROTEINUR 30 (A) 03/09/2016 0648   UROBILINOGEN 0.2 12/05/2013 1657   NITRITE NEGATIVE 03/09/2016 1829  LEUKOCYTESUR TRACE (A) 03/09/2016 0648    Sepsis Labs: Lactic Acid, Venous    Component Value Date/Time   LATICACIDVEN 1.21 03/09/2016 0533    MICROBIOLOGY: No results found for this or any previous visit (from the past 240 hour(s)).  RADIOLOGY STUDIES/RESULTS: Dg Chest 2 View  Result Date: 04/04/2016 CLINICAL DATA:  Chest tightness.  Chest pressure EXAM: CHEST  2 VIEW COMPARISON:  1/16/8 FINDINGS: Stable enlarged cardiac silhouette. There is central venous pulmonary congestion. No effusion, infiltrate or pneumothorax. No pleural fluid. No pneumothorax. IMPRESSION: Cardiomegaly and venous congestion. Electronically Signed   By: Suzy Bouchard M.D.   On: 04/04/2016 15:32   Nm Pulmonary Perf And Vent  Result Date: 04/05/2016 CLINICAL DATA:  76 year old current history of hypertension, diabetes, COPD, asthma, CHF and stage 3 chronic kidney disease who was admitted yesterday with acute onset of chest pain and shortness of breath. EXAM: NUCLEAR MEDICINE VENTILATION - PERFUSION LUNG SCAN TECHNIQUE: Ventilation images were obtained in multiple projections using  inhaled aerosol Tc-97m DTPA. Perfusion images were obtained in multiple projections after intravenous injection of Tc-3m MAA. RADIOPHARMACEUTICALS:  32.4 mCi Technetium-29m DTPA aerosol inhalation and 4.15 mCi Technetium-30m MAA IV COMPARISON:  No prior nuclear imaging. Two-view chest x-ray yesterday. FINDINGS: Ventilation: Central airway aerosol deposition. No focal ventilation defect. Perfusion: No wedge shaped peripheral perfusion defects to suggest acute pulmonary embolism. Homogeneous perfusion throughout both lungs. IMPRESSION: Normal examination. Electronically Signed   By: Evangeline Dakin M.D.   On: 04/05/2016 12:46   Dg Chest Port 1 View  Result Date: 04/08/2016 CLINICAL DATA:  Chest pain today EXAM: PORTABLE CHEST 1 VIEW COMPARISON:  04/04/2016 FINDINGS: The heart remains moderately enlarged. Vascular congestion is stable. Scattered bilateral mid and lower lung zone atelectasis persists. No pneumothorax. No pulmonary edema. IMPRESSION: Stable cardiomegaly and vascular congestion. Electronically Signed   By: Marybelle Killings M.D.   On: 04/08/2016 11:27     LOS: 0 days   Oren Binet, MD  [facility] Pager:[phone]  If 7PM-7AM, please contact night-coverage [email or web address] Password [facility] 04/09/2016, 10:54 AM

## 2016-04-09 NOTE — Progress Notes (Signed)
Physical Therapy Evaluation Patient Details Name: Stephanie Woodward MRN: [id number] DOB: 1940-02-26 Today's Date: 04/09/2016   History of Present Illness  Patient presents to the hospital with a-fib. PMH includes: Renal disorder HTN, gout, COPD, CKD, arthritis, CHF   Clinical Impression  Patient presents with decreased endurance compared to baseline. She had fatigue ambulating 20'. She would benefit from further skilled acute therapy and home health therapy upon discharge.     Follow Up Recommendations Home health PT    Equipment Recommendations       Recommendations for Other Services       Precautions / Restrictions Precautions Precautions: None Restrictions Weight Bearing Restrictions: No      Mobility  Bed Mobility               General bed mobility comments: Patient found in a chair. Per nursing min a to the edge of the bed.   Transfers Overall transfer level: Needs assistance Equipment used: Rolling walker (2 wheeled) Transfers: Sit to/from Stand Sit to Stand: Min assist         General transfer comment: MIn a fror strength to the edge of the bed.   Ambulation/Gait Ambulation/Gait assistance: Min guard Ambulation Distance (Feet): 20 Feet       Gait velocity interpretation: Below normal speed for age/gender General Gait Details: fatigue after ambualtion   Stairs            Wheelchair Mobility    Modified Rankin (Stroke Patients Only)       Balance Overall balance assessment: Needs assistance Sitting-balance support: No upper extremity supported Sitting balance-Leahy Scale: Good     Standing balance support: Bilateral upper extremity supported Standing balance-Leahy Scale: Poor                               Pertinent Vitals/Pain Pain Assessment: No/denies pain    Home Living Family/patient expects to be discharged to:: Private residence Living Arrangements: Children Available Help at Discharge: Family   Home  Access: Stairs to enter Entrance Stairs-Rails: Can reach both Entrance Stairs-Number of Steps: 3     Additional Comments: Daughter works during the day    Prior Function Level of Independence: Independent with assistive device(s)         Comments: Patient was able to get around her house. She had been living alone but recently moved in with her mother.      Hand Dominance   Dominant Hand: Right    Extremity/Trunk Assessment   Upper Extremity Assessment Upper Extremity Assessment: Overall WFL for tasks assessed    Lower Extremity Assessment Lower Extremity Assessment: Generalized weakness       Communication   Communication: No difficulties  Cognition Arousal/Alertness: Awake/alert Behavior During Therapy: WFL for tasks assessed/performed Overall Cognitive Status: Within Functional Limits for tasks assessed                      General Comments General comments (skin integrity, edema, etc.): SOB after ambualtion. Poor endurance per patient compared to baseline     Exercises     Assessment/Plan    PT Assessment Patient needs continued PT services  PT Problem List Decreased strength;Decreased range of motion;Decreased activity tolerance;Decreased mobility;Decreased knowledge of use of DME;Decreased safety awareness          PT Treatment Interventions DME instruction;Gait training;Stair training;Functional mobility training;Therapeutic activities;Therapeutic exercise;Neuromuscular re-education;Patient/family education    PT Goals (Current goals can  be found in the Care Plan section)  Acute Rehab PT Goals Patient Stated Goal: to go home  PT Goal Formulation: With patient Time For Goal Achievement: 04/23/16 Potential to Achieve Goals: Good    Frequency Min 3X/week   Barriers to discharge Decreased caregiver support daughter not home during the day.     Co-evaluation               End of Session Equipment Utilized During Treatment: Gait  belt Activity Tolerance: Patient limited by fatigue Patient left: in chair;with call bell/phone within reach Nurse Communication: Mobility status    Functional Assessment Tool Used: clinical decision making  Functional Limitation: Mobility: Walking and moving around Mobility: Walking and Moving Around Current Status (S1282): At least 40 percent but less than 60 percent impaired, limited or restricted Mobility: Walking and Moving Around Goal Status [phone]): At least 20 percent but less than 40 percent impaired, limited or restricted    Time: 0220-0243 PT Time Calculation (min) (ACUTE ONLY): 23 min   Charges:   PT Evaluation $PT Eval Low Complexity: 1 Procedure     PT G Codes:   PT G-Codes **NOT FOR INPATIENT CLASS** Functional Assessment Tool Used: clinical decision making  Functional Limitation: Mobility: Walking and moving around Mobility: Walking and Moving Around Current Status (I7195): At least 40 percent but less than 60 percent impaired, limited or restricted Mobility: Walking and Moving Around Goal Status [phone]): At least 20 percent but less than 40 percent impaired, limited or restricted    Carney Living PT DPT  04/09/2016, 3:26 PM

## 2016-04-09 NOTE — Progress Notes (Signed)
   04/09/16 1030  Clinical Encounter Type  Visited With Patient  Visit Type Other (Comment) ([place] consult)  Spiritual Encounters  Spiritual Needs Emotional  Stress Factors  Patient Stress Factors None identified  Introduction to Pt. Pt reports not requesting Advanced Directive and stated no interest.

## 2016-04-09 NOTE — Progress Notes (Addendum)
ADDENDUM Spoke with Dr. Sloan Leiter on the phone about long term anticoagulation for this patient. Xarelto is a better option than Eliquis given SCr of 2.12 and borderline age. In addition, Eliquis trials did not include patients with a CrCl <45mL/min, so safety and efficacy is not known in this population.  Plan: -continue heparin at 1000 units/hr until 1659 tonight -give Xarelto 15mg  PO qsupper starting tonight at 1700 -follow CBC and s/s bleeding -will provide education prior to discharge  Stephanie Woodward, PharmD, BCPS Clinical Pharmacist Pager: [phone] 04/09/2016 1:13 PM    ANTICOAGULATION CONSULT NOTE - [place] for Heparin  Indication: atrial fibrillation  Allergies  Allergen Reactions  . Eggs Or Egg-Derived Products Nausea And Vomiting  . Lisinopril Nausea And Vomiting  . Penicillins Nausea And Vomiting   Patient Measurements: Height: 5\' 3"  (160 cm) Weight: 208 lb 8 oz (94.6 kg) IBW/kg (Calculated) : 52.4  Vital Signs: Temp: 98.6 F (37 C) (02/16 0403) Temp Source: Oral (02/16 0403) BP: 111/65 (02/16 0403) Pulse Rate: 58 (02/16 0600)  Labs:  Recent Labs  04/08/16 1102 04/08/16 1411 04/08/16 1859 04/09/16 0144 04/09/16 0955  HGB 10.9*  --   --  9.6*  --   HCT 35.3*  --   --  31.9*  --   PLT 325  --   --  320  --   HEPARINUNFRC  --   --   --  0.42 0.52  CREATININE 1.95*  --   --  2.12*  --   TROPONINI 0.04* 0.05* 0.15* 0.20*  --     Estimated Creatinine Clearance: 25.9 mL/min (by C-G formula based on SCr of 2.12 mg/dL (H)).  Assessment: 76 y/o F on heparin for new onset afib, confirmatory heparin level remains therapeutic at 0.52 units/mL. HGb 9.6, plts 320- stable, no bleeding noted. Awaiting cardiology evaluation.  Goal of Therapy:  Heparin level 0.3-0.7 units/ml Monitor platelets by anticoagulation protocol: Yes   Plan:  -Cont heparin at 1000 units/hr -Daily heparin level and CBC -Follow for long term  anticoagulation plans  Stephanie Woodward, PharmD, BCPS Clinical Pharmacist Pager: [phone] 04/09/2016 11:24 AM

## 2016-04-09 NOTE — Discharge Instructions (Addendum)
Atrial Fibrillation Introduction Atrial fibrillation is a type of heartbeat that is irregular or fast (rapid). If you have this condition, your heart keeps quivering in a weird (chaotic) way. This condition can make it so your heart cannot pump blood normally. Having this condition gives a person more risk for stroke, heart failure, and other heart problems. There are different types of atrial fibrillation. Talk with your doctor to learn about the type that you have. Follow these instructions at home:  Take over-the-counter and prescription medicines only as told by your doctor.  If your doctor prescribed a blood-thinning medicine, take it exactly as told. Taking too much of it can cause bleeding. If you do not take enough of it, you will not have the protection that you need against stroke and other problems.  Do not use any tobacco products. These include cigarettes, chewing tobacco, and e-cigarettes. If you need help quitting, ask your doctor.  If you have apnea (obstructive sleep apnea), manage it as told by your doctor.  Do not drink alcohol.  Do not drink beverages that have caffeine. These include coffee, soda, and tea.  Maintain a healthy weight. Do not use diet pills unless your doctor says they are safe for you. Diet pills may make heart problems worse.  Follow diet instructions as told by your doctor.  Exercise regularly as told by your doctor.  Keep all follow-up visits as told by your doctor. This is important. Contact a doctor if:  You notice a change in the speed, rhythm, or strength of your heartbeat.  You are taking a blood-thinning medicine and you notice more bruising.  You get tired more easily when you move or exercise. Get help right away if:  You have pain in your chest or your belly (abdomen).  You have sweating or weakness.  You feel sick to your stomach (nauseous).  You notice blood in your throw up (vomit), poop (stool), or pee (urine).  You are  short of breath.  You suddenly have swollen feet and ankles.  You feel dizzy.  Your suddenly get weak or numb in your face, arms, or legs, especially if it happens on one side of your body.  You have trouble talking, trouble understanding, or both.  Your face or your eyelid droops on one side. These symptoms may be an emergency. Do not wait to see if the symptoms will go away. Get medical help right away. Call your local emergency services (911 in the [place] Do not drive yourself to the hospital.  This information is not intended to replace advice given to you by your health care provider. Make sure you discuss any questions you have with your health care provider. Document Released: 11/18/2007 Document Revised: 07/17/2015 Document Reviewed: 06/05/2014  2017 Elsevier   Information on my medicine - XARELTO (Rivaroxaban)  This medication education was reviewed with me or my healthcare representative as part of my discharge preparation.   Why was Xarelto prescribed for you? Xarelto was prescribed for you to reduce the risk of a blood clot forming that can cause a stroke if you have a medical condition called atrial fibrillation (a type of irregular heartbeat).  What do you need to know about xarelto ? Take your Xarelto ONCE DAILY at the same time every day with your evening meal. If you have difficulty swallowing the tablet whole, you may crush it and mix in applesauce just prior to taking your dose.  Take Xarelto exactly as prescribed by your doctor and  DO NOT stop taking Xarelto without talking to the doctor who prescribed the medication.  Stopping without other stroke prevention medication to take the place of Xarelto may increase your risk of developing a clot that causes a stroke.  Refill your prescription before you run out.  After discharge, you should have regular check-up appointments with your healthcare provider that is prescribing your Xarelto.  In the future your  dose may need to be changed if your kidney function or weight changes by a significant amount.  What do you do if you miss a dose? If you are taking Xarelto ONCE DAILY and you miss a dose, take it as soon as you remember on the same day then continue your regularly scheduled once daily regimen the next day. Do not take two doses of Xarelto at the same time or on the same day.   Important Safety Information A possible side effect of Xarelto is bleeding. You should call your healthcare provider right away if you experience any of the following: ? Bleeding from an injury or your nose that does not stop. ? Unusual colored urine (red or dark brown) or unusual colored stools (red or black). ? Unusual bruising for unknown reasons. ? A serious fall or if you hit your head (even if there is no bleeding).  Some medicines may interact with Xarelto and might increase your risk of bleeding while on Xarelto. To help avoid this, consult your healthcare provider or pharmacist prior to using any new prescription or non-prescription medications, including herbals, vitamins, non-steroidal anti-inflammatory drugs (NSAIDs) and supplements.  This website has more information on Xarelto: [email or web address]   Amiodarone tablets What is this medicine? AMIODARONE (a MEE oh da rone) is an antiarrhythmic drug. It helps make your heart beat regularly. Because of the side effects caused by this medicine, it is only used when other medicines have not worked. It is usually used for heartbeat problems that may be life threatening. This medicine may be used for other purposes; ask your health care provider or pharmacist if you have questions. COMMON BRAND NAME(S): Cordarone, Pacerone What should I tell my health care provider before I take this medicine? They need to know if you have any of these conditions: -liver disease -lung disease -other heart problems -thyroid disease -an unusual or allergic reaction to  amiodarone, iodine, other medicines, foods, dyes, or preservatives -pregnant or trying to get pregnant -breast-feeding How should I use this medicine? Take this medicine by mouth with a glass of water. Follow the directions on the prescription label. You can take this medicine with or without food. However, you should always take it the same way each time. Take your doses at regular intervals. Do not take your medicine more often than directed. Do not stop taking except on the advice of your doctor or health care professional. A special MedGuide will be given to you by the pharmacist with each prescription and refill. Be sure to read this information carefully each time. Talk to your pediatrician regarding the use of this medicine in children. Special care may be needed. Overdosage: If you think you have taken too much of this medicine contact a poison control center or emergency room at once. NOTE: This medicine is only for you. Do not share this medicine with others. What if I miss a dose? If you miss a dose, take it as soon as you can. If it is almost time for your next dose, take only that dose. Do not take double  or extra doses. What may interact with this medicine? Do not take this medicine with any of the following medications: -abarelix -apomorphine -arsenic trioxide -certain antibiotics like erythromycin, gemifloxacin, levofloxacin, pentamidine -certain medicines for depression like amoxapine, tricyclic antidepressants -certain medicines for fungal infections like fluconazole, itraconazole, ketoconazole, posaconazole, voriconazole -certain medicines for irregular heart beat like disopyramide, dofetilide, dronedarone, ibutilide, propafenone, sotalol -certain medicines for malaria like chloroquine, halofantrine -cisapride -droperidol -haloperidol -hawthorn -maprotiline -methadone -phenothiazines like chlorpromazine, mesoridazine, thioridazine -pimozide -ranolazine -red yeast  rice -vardenafil -ziprasidone This medicine may also interact with the following medications: -antiviral medicines for HIV or AIDS -certain medicines for blood pressure, heart disease, irregular heart beat -certain medicines for cholesterol like atorvastatin, cerivastatin, lovastatin, simvastatin -certain medicines for hepatitis C like sofosbuvir and ledipasvir; sofosbuvir -certain medicines for seizures like phenytoin -certain medicines for thyroid problems -certain medicines that treat or prevent blood clots like warfarin -cholestyramine -cimetidine -clopidogrel -cyclosporine -dextromethorphan -diuretics -fentanyl -general anesthetics -grapefruit juice -lidocaine -loratadine -methotrexate -other medicines that prolong the QT interval (cause an abnormal heart rhythm) -procainamide -quinidine -rifabutin, rifampin, or rifapentine -St. John's Wort -trazodone This list may not describe all possible interactions. Give your health care provider a list of all the medicines, herbs, non-prescription drugs, or dietary supplements you use. Also tell them if you smoke, drink alcohol, or use illegal drugs. Some items may interact with your medicine. What should I watch for while using this medicine? Your condition will be monitored closely when you first begin therapy. Often, this drug is first started in a hospital or other monitored health care setting. Once you are on maintenance therapy, visit your doctor or health care professional for regular checks on your progress. Because your condition and use of this medicine carry some risk, it is a good idea to carry an identification card, necklace or bracelet with details of your condition, medications, and doctor or health care professional. Dennis Bast may get drowsy or dizzy. Do not drive, use machinery, or do anything that needs mental alertness until you know how this medicine affects you. Do not stand or sit up quickly, especially if you are an older  patient. This reduces the risk of dizzy or fainting spells. This medicine can make you more sensitive to the sun. Keep out of the sun. If you cannot avoid being in the sun, wear protective clothing and use sunscreen. Do not use sun lamps or tanning beds/booths. You should have regular eye exams before and during treatment. Call your doctor if you have blurred vision, see halos, or your eyes become sensitive to light. Your eyes may get dry. It may be helpful to use a lubricating eye solution or artificial tears solution. If you are going to have surgery or a procedure that requires contrast dyes, tell your doctor or health care professional that you are taking this medicine. What side effects may I notice from receiving this medicine? Side effects that you should report to your doctor or health care professional as soon as possible: -allergic reactions like skin rash, itching or hives, swelling of the face, lips, or tongue -blue-gray coloring of the skin -blurred vision, seeing blue green halos, increased sensitivity of the eyes to light -breathing problems -chest pain -dark urine -fast, irregular heartbeat -feeling faint or light-headed -intolerance to heat or cold -nausea or vomiting -pain and swelling of the scrotum -pain, tingling, numbness in feet, hands -redness, blistering, peeling or loosening of the skin, including inside the mouth -spitting up blood -stomach pain -sweating -unusual or uncontrolled  movements of body -unusually weak or tired -weight gain or loss -yellowing of the eyes or skin Side effects that usually do not require medical attention (report to your doctor or health care professional if they continue or are bothersome): -change in sex drive or performance -constipation -dizziness -headache -loss of appetite -trouble sleeping This list may not describe all possible side effects. Call your doctor for medical advice about side effects. You may report side effects  to FDA at 1-800-FDA-1088. Where should I keep my medicine? Keep out of the reach of children. Store at room temperature between 20 and 25 degrees C (68 and 77 degrees F). Protect from light. Keep container tightly closed. Throw away any unused medicine after the expiration date. NOTE: This sheet is a summary. It may not cover all possible information. If you have questions about this medicine, talk to your doctor, pharmacist, or health care provider.  2017 Elsevier/Gold Standard (2013-05-14 19:48:11)   Heart-Healthy Eating Plan Introduction Heart-healthy meal planning includes:  Limiting unhealthy fats.  Increasing healthy fats.  Making other small dietary changes. You may need to talk with your doctor or a diet specialist (dietitian) to create an eating plan that is right for you. What types of fat should I choose?  Choose healthy fats. These include olive oil and canola oil, flaxseeds, walnuts, almonds, and seeds.  Eat more omega-3 fats. These include salmon, mackerel, sardines, tuna, flaxseed oil, and ground flaxseeds. Try to eat fish at least twice each week.  Limit saturated fats.  Saturated fats are often found in animal products, such as meats, butter, and cream.  Plant sources of saturated fats include palm oil, palm kernel oil, and coconut oil.  Avoid foods with partially hydrogenated oils in them. These include stick margarine, some tub margarines, cookies, crackers, and other baked goods. These contain trans fats. What general guidelines do I need to follow?  Check food labels carefully. Identify foods with trans fats or high amounts of saturated fat.  Fill one half of your plate with vegetables and green salads. Eat 4-5 servings of vegetables per day. A serving of vegetables is:  1 cup of raw leafy vegetables.   cup of raw or cooked cut-up vegetables.   cup of vegetable juice.  Fill one fourth of your plate with whole grains. Look for the word "whole" as the  first word in the ingredient list.  Fill one fourth of your plate with lean protein foods.  Eat 4-5 servings of fruit per day. A serving of fruit is:  One medium whole fruit.   cup of dried fruit.   cup of fresh, frozen, or canned fruit.   cup of 100% fruit juice.  Eat more foods that contain soluble fiber. These include apples, broccoli, carrots, beans, peas, and barley. Try to get 20-30 g of fiber per day.  Eat more home-cooked food. Eat less restaurant, buffet, and fast food.  Limit or avoid alcohol.  Limit foods high in starch and sugar.  Avoid fried foods.  Avoid frying your food. Try baking, boiling, grilling, or broiling it instead. You can also reduce fat by:  Removing the skin from poultry.  Removing all visible fats from meats.  Skimming the fat off of stews, soups, and gravies before serving them.  Steaming vegetables in water or broth.  Lose weight if you are overweight.  Eat 4-5 servings of nuts, legumes, and seeds per week:  One serving of dried beans or legumes equals  cup after being cooked.  One serving of nuts equals 1 ounces.  One serving of seeds equals  ounce or one tablespoon.  You may need to keep track of how much salt or sodium you eat. This is especially true if you have high blood pressure. Talk with your doctor or dietitian to get more information. What foods can I eat? Grains  Breads, including [place], white, pita, wheat, raisin, rye, oatmeal, and [place]. Tortillas that are neither fried nor made with lard or trans fat. Low-fat rolls, including hotdog and hamburger buns and English muffins. Biscuits. Muffins. Waffles. Pancakes. Light popcorn. Whole-grain cereals. Flatbread. Melba toast. Pretzels. Breadsticks. Rusks. Low-fat snacks. Low-fat crackers, including oyster, saltine, matzo, graham, animal, and rye. Rice and pasta, including brown rice and pastas that are made with whole wheat. Vegetables  All vegetables. Fruits  All  fruits, but limit coconut. Meats and Other Protein Sources  Lean, well-trimmed beef, veal, pork, and lamb. Chicken and [place] without skin. All fish and shellfish. Wild duck, rabbit, pheasant, and venison. Egg whites or low-cholesterol egg substitutes. Dried beans, peas, lentils, and tofu. Seeds and most nuts. Dairy  Low-fat or nonfat cheeses, including ricotta, string, and mozzarella. Skim or 1% milk that is liquid, powdered, or evaporated. Buttermilk that is made with low-fat milk. Nonfat or low-fat yogurt. Beverages  Mineral water. Diet carbonated beverages. Sweets and Desserts  Sherbets and fruit ices. Honey, jam, marmalade, jelly, and syrups. Meringues and gelatins. Pure sugar candy, such as hard candy, jelly beans, gumdrops, mints, marshmallows, and small amounts of dark chocolate. [employer] Eat all sweets and desserts in moderation. Fats and Oils  Nonhydrogenated (trans-free) margarines. Vegetable oils, including soybean, sesame, sunflower, olive, peanut, safflower, corn, canola, and cottonseed. Salad dressings or mayonnaise made with a vegetable oil. Limit added fats and oils that you use for cooking, baking, salads, and as spreads. Other  Cocoa powder. Coffee and tea. All seasonings and condiments. The items listed above may not be a complete list of recommended foods or beverages. Contact your dietitian for more options.  What foods are not recommended? Grains  Breads that are made with saturated or trans fats, oils, or whole milk. Croissants. Butter rolls. Cheese breads. Sweet rolls. Donuts. Buttered popcorn. Chow mein noodles. High-fat crackers, such as cheese or butter crackers. Meats and Other Protein Sources  Fatty meats, such as hotdogs, short ribs, sausage, spareribs, bacon, rib eye roast or steak, and mutton. High-fat deli meats, such as salami and bologna. Caviar. Domestic duck and goose. Organ meats, such as kidney, liver, sweetbreads, and heart. Dairy  Cream, sour  cream, cream cheese, and creamed cottage cheese. Whole-milk cheeses, including blue (bleu), Monte[place] Jack, [place], [place], [place], [place], Swiss, cheddar, [place], and [place]. Whole or 2% milk that is liquid, evaporated, or condensed. Whole buttermilk. Cream sauce or high-fat cheese sauce. Yogurt that is made from whole milk. Beverages  Regular sodas and juice drinks with added sugar. Sweets and Desserts  Frosting. Pudding. Cookies. Cakes other than angel food cake. Candy that has milk chocolate or white chocolate, hydrogenated fat, butter, coconut, or unknown ingredients. Buttered syrups. Full-fat ice cream or ice cream drinks. Fats and Oils  Gravy that has suet, meat fat, or shortening. Cocoa butter, hydrogenated oils, palm oil, coconut oil, palm kernel oil. These can often be found in baked products, candy, fried foods, nondairy creamers, and whipped toppings. Solid fats and shortenings, including bacon fat, salt pork, lard, and butter. Nondairy cream substitutes, such as coffee creamers and sour cream substitutes. Salad dressings  that are made of unknown oils, cheese, or sour cream. The items listed above may not be a complete list of foods and beverages to avoid. Contact your dietitian for more information.  This information is not intended to replace advice given to you by your health care provider. Make sure you discuss any questions you have with your health care provider. Document Released: 08/10/2011 Document Revised: 07/17/2015 Document Reviewed: 08/02/2013  2017 Elsevier

## 2016-04-09 NOTE — Progress Notes (Signed)
ANTICOAGULATION CONSULT NOTE - Follow Up Consult  Pharmacy Consult for Heparin  Indication: atrial fibrillation  Allergies  Allergen Reactions  . Eggs Or Egg-Derived Products Nausea And Vomiting  . Lisinopril Nausea And Vomiting  . Penicillins Nausea And Vomiting   Patient Measurements: Height: 5\' 3"  (160 cm) Weight: 208 lb 9.8 oz (94.6 kg) IBW/kg (Calculated) : 52.4  Vital Signs: Temp: 98.3 F (36.8 C) (02/15 2057) Temp Source: Oral (02/15 2057) BP: 126/68 (02/15 2057) Pulse Rate: 56 (02/15 2057)  Labs:  Recent Labs  04/06/16 0309  04/08/16 1102 04/08/16 1411 04/08/16 1859 04/09/16 0144  HGB 10.2*  --  10.9*  --   --  9.6*  HCT 33.6*  --  35.3*  --   --  31.9*  PLT 272  --  325  --   --  320  HEPARINUNFRC  --   --   --   --   --  0.42  CREATININE 2.04*  --  1.95*  --   --  2.12*  TROPONINI  --   < > 0.04* 0.05* 0.15* 0.20*  < > = values in this interval not displayed.  Estimated Creatinine Clearance: 25.9 mL/min (by C-G formula based on SCr of 2.12 mg/dL (H)).  Assessment: 76 y/o F on heparin for afib, initial heparin level is therapeutic  Goal of Therapy:  Heparin level 0.3-0.7 units/ml Monitor platelets by anticoagulation protocol: Yes   Plan:  -Cont heparin at 1000 units/hr -1000 HL  Shaela Boer 04/09/2016,2:45 AM

## 2016-04-09 NOTE — Care Management Note (Signed)
Case Management Note  Patient Details  Name: Stephanie [facility] MRN: [id number] Date of Birth: 20-Sep-1940  Subjective/Objective:  76 yr old female in with RVR.              Action/Plan: Case manager spoke with patient, she asked that we contact her daughter so that she could be involved in conversation. Case manager explained that the purpose of conversation was to offer choice for [place]. CM explained that her mom would need physical therapy as recommended by therapist. Patient states she has used [facility] in the past and was satisfied with their care.CM explained that Stephanie [facility] has had a name change and to expect a call from [facility].  Daughter deferred to mom's wishes. Case manager also informed Stephanie [facility] that her mother would be signing her Medicare Observation Letter, and explained that to her as well. Referral for [facility] physical therapy was called to Corliss Blacker, Stephanie at [facility] ([place]).    Expected Discharge Date:  04/10/16           Expected Discharge Plan:  [place]  In-House Referral:  NA  Discharge planning Services  CM Consult  Post Acute Care Choice:  [facility] Choice offered to:  Patient, Adult Children  DME Arranged:    DME Agency:     HH Arranged:  PT, RN, Disease Management Stephanie [facility] Agency:  [facility]phan[facility] (formerly Stephanie [facility])  Status of Service:  In process, will continue to follow  If discussed at Long Length of Stay Meetings, dates discussed:    Additional Comments:  Ninfa Meeker, RN 04/09/2016, 4:51 PM

## 2016-04-10 DIAGNOSIS — I209 Angina pectoris, unspecified: Secondary | ICD-10-CM | POA: Diagnosis not present

## 2016-04-10 DIAGNOSIS — I4891 Unspecified atrial fibrillation: Secondary | ICD-10-CM | POA: Diagnosis not present

## 2016-04-10 DIAGNOSIS — E1122 Type 2 diabetes mellitus with diabetic chronic kidney disease: Secondary | ICD-10-CM | POA: Diagnosis not present

## 2016-04-10 DIAGNOSIS — I5022 Chronic systolic (congestive) heart failure: Secondary | ICD-10-CM | POA: Diagnosis not present

## 2016-04-10 DIAGNOSIS — I480 Paroxysmal atrial fibrillation: Secondary | ICD-10-CM | POA: Diagnosis not present

## 2016-04-10 DIAGNOSIS — I10 Essential (primary) hypertension: Secondary | ICD-10-CM | POA: Diagnosis not present

## 2016-04-10 LAB — CBC
HCT: 33.9 % — ABNORMAL LOW (ref 36.0–46.0)
Hemoglobin: 10.4 g/dL — ABNORMAL LOW (ref 12.0–15.0)
MCH: 26.1 pg (ref 26.0–34.0)
MCHC: 30.7 g/dL (ref 30.0–36.0)
MCV: 85.2 fL (ref 78.0–100.0)
Platelets: 357 10*3/uL (ref 150–400)
RBC: 3.98 MIL/uL (ref 3.87–5.11)
RDW: 15.6 % — ABNORMAL HIGH (ref 11.5–15.5)
WBC: 10.9 10*3/uL — ABNORMAL HIGH (ref 4.0–10.5)

## 2016-04-10 LAB — GLUCOSE, CAPILLARY
Glucose-Capillary: 139 mg/dL — ABNORMAL HIGH (ref 65–99)
Glucose-Capillary: 214 mg/dL — ABNORMAL HIGH (ref 65–99)

## 2016-04-10 MED ORDER — AMIODARONE HCL 200 MG PO TABS: 200.0000 mg | ORAL_TABLET | Freq: Two times a day (BID) | ORAL | 0 refills | Status: DC

## 2016-04-10 MED ORDER — RIVAROXABAN 15 MG PO TABS: 15.0000 mg | ORAL_TABLET | Freq: Every day | ORAL | 0 refills | Status: DC

## 2016-04-10 NOTE — Progress Notes (Signed)
Subjective:  She is sleeping in the chair all night because the bed is so uncomfortable.  No recurrence of atrial fibrillation since yesterday and no shortness of breath.  No chest discomfort.  Objective:  Vital Signs in the last 24 hours: BP (!) 126/39   Pulse 60   Temp 98.3 F (36.8 C) (Oral)   Resp (!) 24   Ht 5\' 3"  (1.6 m)   Wt 91.2 kg (201 lb 1.6 oz)   SpO2 100%   BMI 35.62 kg/m   Physical Exam: Significant obese black female currently in no acute distress Lungs:  Clear Cardiac:  Regular rhythm, normal S1 and S2, no S3, occasional irregular beats noted. Extremities:  No edema present  Intake/Output from previous day: 02/16 0701 - 02/17 0700 In: 720 [P.O.:720] Out: 850 [Urine:850]  Weight Filed Weights   04/08/16 1740 04/09/16 0403 04/10/16 0600  Weight: 94.6 kg (208 lb 9.8 oz) 94.6 kg (208 lb 8 oz) 91.2 kg (201 lb 1.6 oz)    Lab Results: Basic Metabolic Panel:  Recent Labs  04/09/16 0144 04/09/16 1853  NA 139 142  K 4.3 4.0  CL 104 106  CO2 26 28  GLUCOSE 265* 134*  BUN 23* 19  CREATININE 2.12* 1.79*   CBC:  Recent Labs  04/08/16 1102 04/09/16 0144 04/10/16 0449  WBC 10.9* 9.1 10.9*  NEUTROABS 7.7  --   --   HGB 10.9* 9.6* 10.4*  HCT 35.3* 31.9* 33.9*  MCV 85.3 85.3 85.2  PLT 325 320 357   Cardiac Enzymes: Troponin (Point of Care Test) No results for input(s): TROPIPOC in the last 72 hours. Cardiac Panel (last 3 results)  Recent Labs  04/08/16 1411 04/08/16 1859 04/09/16 0144  TROPONINI 0.05* 0.15* 0.20*    Telemetry: Sinus rhythm and sinus bradycardia with PACs, personally reviewed by me  Assessment/Plan:  1.  Paroxysmal atrial fibrillation currently in sinus rhythm having been started on amiodarone 2.  Troponin elevation likely due to demand ischemia 3.  Systolic and diastolic congestive heart failure likely worse with atrial fibrillation 4.  Hypertensive heart disease  Recommendations:  No recurrence of atrial  fibrillation.  She would greatly benefit from weight reduction that would help her diastolic dysfunction and also potentially help her atrial fibrillation.  She was started on Xarelto at a lower dose adjusted for renal function.  I would send her home on amiodarone twice daily for another week and I would like to see her back in the office in one week.  Would benefit from home health care.  The family asked about cardiac rehabilitation but I doubt that she would qualify.     Kerry Hough  MD [facility] Card[facility]  04/10/2016, 7:46 AM

## 2016-04-10 NOTE — Discharge Summary (Signed)
PATIENT DETAILS Name: Stephanie Woodward Age: 76 y.o. Sex: female Date of Birth: 12-03-1940 MRN: [id number]. Admitting Physician: Elwin Mocha, MD NWG:NFAO Alroy Dust, MD  Admit Date: 04/08/2016 Discharge date: 04/10/2016  Recommendations for Outpatient Follow-up:  1. Follow up with PCP in 1-2 weeks 2. Please obtain BMP/CBC in one week 3. New Medication-Xarelto and amiodarone 4. Please ensure follow up with cardiology (Dr. Wynonia Lawman) and nephrology (Dr. Moshe Cipro)  5. Continue to follow renal function closely-may need adjustment or discontinuation of Xarelto.  Admitted From:  Home  Disposition: Home with home health services   Home Health:  Yes  Equipment/Devices: None  Discharge Condition: Stable  CODE STATUS: FULL CODE  Diet recommendation:  Heart Healthy / Carb Modified  Brief Summary: See H&P, Labs, Consult and Test reports for all details in brief,  Patient is a 76 y.o. female with history of stage III chronic kidney disease, hypertension, type 2 diabetes, chronic diastolic heart failure admitted for evaluation of chest pain and palpitations. Found to have A. fib with RVR and subsequently admitted for further evaluation and treatment. See below for further details  Brief Hospital Course: Chest pain: Likely atypical, in a setting of A. fib with RVR. Troponin mildly elevated-most recent echocardiogram on/12 showed preserved EF without any wall motion abnormalities. Cardiology evaluation has been completed, no further workup required. Felt to have troponin elevation due to demand ischemia in a setting of atrial fibrillation.  Atrial fibrillation with RVR:  started on IV Cardizem-subsequently spontaneously converted back to sinus rhythm. Was also maintained on IV heparin. CHA2DS2VASC score of atleast 3-after discussion with pharmacy, patient and daughter (over the phone this morning) we have decided to start her on Xarelto-adjusted dose to her renal function. Patient  aware of bleeding risk with Xarelto  Type 2 diabetes: CBGs stable with 30 units of Levemir, 12 and its of NovoLog before meals and SSI. Resume oral hypoglycemic agents on discharge.  Hypertension: Controlled, continue Toprol and Avapro.  Dyslipidemia: Continue statin  Chronic diastolic heart failure: Reasonably well compensated-continue Lasix and Toprol.  Chronic kidney disease stage III: Creatinine close to usual baseline, follow electrolytes periodically.  Chronic venous stasis: Stable-at baseline.  Morbid Obesity: Counseled regarding importance of weight loss  OSA  Procedures/Studies: None  Discharge Diagnoses:  Principal Problem:   Atrial fibrillation with RVR (HCC) Active Problems:   Kidney disease, chronic, stage III (GFR 30-59 ml/min)   CHF (congestive heart failure) (HCC)   Essential hypertension   Hyperlipidemia   Diabetes mellitus type 2, controlled ([place])   Discharge Instructions:  Activity:  As tolerated with Full fall precautions use walker/cane & assistance as needed   Discharge Instructions    Diet - low sodium heart healthy    Complete by:  As directed    Diet Carb Modified    Complete by:  As directed    Increase activity slowly    Complete by:  As directed      Allergies as of 04/10/2016      Reactions   Eggs Or Egg-derived Products Nausea And Vomiting   Lisinopril Nausea And Vomiting   Penicillins Nausea And Vomiting      Medication List    TAKE these medications   albuterol 108 (90 Base) MCG/ACT inhaler Commonly known as:  PROVENTIL HFA;VENTOLIN HFA Inhale 2 puffs into the lungs every 4 (four) hours as needed for shortness of breath.   amiodarone 200 MG tablet Commonly known as:  PACERONE Take 1 tablet (200 mg total) by mouth  2 (two) times daily.   atorvastatin 80 MG tablet Commonly known as:  LIPITOR Take 80 mg by mouth daily.   furosemide 40 MG tablet Commonly known as:  LASIX Take 1 tablet (40 mg total) by mouth 2  (two) times daily.   insulin aspart 100 UNIT/ML injection Commonly known as:  novoLOG Inject 12 Units into the skin 3 (three) times daily before meals. If sugar is over 200 What changed:  when to take this  reasons to take this  additional instructions   insulin detemir 100 UNIT/ML injection Commonly known as:  LEVEMIR Inject 0.1 mLs (10 Units total) into the skin every morning. What changed:  how much to take   latanoprost 0.005 % ophthalmic solution Commonly known as:  XALATAN Place 1 drop into both eyes at bedtime.   metoprolol succinate 25 MG 24 hr tablet Commonly known as:  TOPROL-XL Take 25 mg by mouth 2 (two) times daily.   Rivaroxaban 15 MG Tabs tablet Commonly known as:  XARELTO Take 1 tablet (15 mg total) by mouth daily with supper.   sitaGLIPtin 50 MG tablet Commonly known as:  JANUVIA Take 25 mg by mouth daily.   telmisartan 80 MG tablet Commonly known as:  MICARDIS Take 80 mg by mouth daily.   traMADol 50 MG tablet Commonly known as:  ULTRAM Take 50 mg by mouth 2 (two) times daily.   triamcinolone cream 0.1 % Commonly known as:  KENALOG Apply 1 application topically 2 (two) times daily.      Follow-up Information    [facility] Follow up.   Specialty:  Home Health Services Why:  Someone from [facility] will contact you to arrange start date and time for RN and physical therapist. Contact information: [place] [id number] [phone]          Allergies  Allergen Reactions  . Eggs Or Egg-Derived Products Nausea And Vomiting  . Lisinopril Nausea And Vomiting  . Penicillins Nausea And Vomiting    Consultations:   cardiology  Other Procedures/Studies: Dg Chest 2 View  Result Date: 04/04/2016 CLINICAL DATA:  Chest tightness.  Chest pressure EXAM: CHEST  2 VIEW COMPARISON:  1/16/8 FINDINGS: Stable enlarged cardiac silhouette. There is central venous pulmonary congestion. No effusion, infiltrate or  pneumothorax. No pleural fluid. No pneumothorax. IMPRESSION: Cardiomegaly and venous congestion. Electronically Signed   By: Suzy Bouchard M.D.   On: 04/04/2016 15:32   Nm Pulmonary Perf And Vent  Result Date: 04/05/2016 CLINICAL DATA:  76 year old current history of hypertension, diabetes, COPD, asthma, CHF and stage 3 chronic kidney disease who was admitted yesterday with acute onset of chest pain and shortness of breath. EXAM: NUCLEAR MEDICINE VENTILATION - PERFUSION LUNG SCAN TECHNIQUE: Ventilation images were obtained in multiple projections using inhaled aerosol Tc-44m DTPA. Perfusion images were obtained in multiple projections after intravenous injection of Tc-3m MAA. RADIOPHARMACEUTICALS:  32.4 mCi Technetium-54m DTPA aerosol inhalation and 4.15 mCi Technetium-4m MAA IV COMPARISON:  No prior nuclear imaging. Two-view chest x-ray yesterday. FINDINGS: Ventilation: Central airway aerosol deposition. No focal ventilation defect. Perfusion: No wedge shaped peripheral perfusion defects to suggest acute pulmonary embolism. Homogeneous perfusion throughout both lungs. IMPRESSION: Normal examination. Electronically Signed   By: Evangeline Dakin M.D.   On: 04/05/2016 12:46   Dg Chest Port 1 View  Result Date: 04/08/2016 CLINICAL DATA:  Chest pain today EXAM: PORTABLE CHEST 1 VIEW COMPARISON:  04/04/2016 FINDINGS: The heart remains moderately enlarged. Vascular congestion  is stable. Scattered bilateral mid and lower lung zone atelectasis persists. No pneumothorax. No pulmonary edema. IMPRESSION: Stable cardiomegaly and vascular congestion. Electronically Signed   By: Marybelle Killings M.D.   On: 04/08/2016 11:27    TODAY-DAY OF DISCHARGE:  Subjective:   Stephanie Woodward today has no headache,no chest abdominal pain,no new weakness tingling or numbness, feels much better wants to go home today.   Objective:   Blood pressure (!) 138/57, pulse 60, temperature 98.3 F (36.8 C), temperature source Oral,  resp. rate (!) 24, height 5\' 3"  (1.6 m), weight 91.2 kg (201 lb 1.6 oz), SpO2 100 %.  Intake/Output Summary (Last 24 hours) at 04/10/16 1117 Last data filed at 04/09/16 1751  Gross per 24 hour  Intake              600 ml  Output              850 ml  Net             -250 ml   Filed Weights   04/08/16 1740 04/09/16 0403 04/10/16 0600  Weight: 94.6 kg (208 lb 9.8 oz) 94.6 kg (208 lb 8 oz) 91.2 kg (201 lb 1.6 oz)    Exam: Awake Alert, Oriented *3, No new F.N deficits, Normal affect [place].AT,PERRAL Supple Neck,No JVD, No cervical lymphadenopathy appriciated.  Symmetrical Chest wall movement, Good air movement bilaterally, CTAB RRR,No Gallops,Rubs or new Murmurs, No Parasternal Heave +ve B.Sounds, Abd Soft, Non tender, No organomegaly appriciated No Cyanosis, Clubbing or edema, No new Rash or bruise   PERTINENT RADIOLOGIC STUDIES: Dg Chest 2 View  Result Date: 04/04/2016 CLINICAL DATA:  Chest tightness.  Chest pressure EXAM: CHEST  2 VIEW COMPARISON:  1/16/8 FINDINGS: Stable enlarged cardiac silhouette. There is central venous pulmonary congestion. No effusion, infiltrate or pneumothorax. No pleural fluid. No pneumothorax. IMPRESSION: Cardiomegaly and venous congestion. Electronically Signed   By: Suzy Bouchard M.D.   On: 04/04/2016 15:32   Nm Pulmonary Perf And Vent  Result Date: 04/05/2016 CLINICAL DATA:  76 year old current history of hypertension, diabetes, COPD, asthma, CHF and stage 3 chronic kidney disease who was admitted yesterday with acute onset of chest pain and shortness of breath. EXAM: NUCLEAR MEDICINE VENTILATION - PERFUSION LUNG SCAN TECHNIQUE: Ventilation images were obtained in multiple projections using inhaled aerosol Tc-32m DTPA. Perfusion images were obtained in multiple projections after intravenous injection of Tc-34m MAA. RADIOPHARMACEUTICALS:  32.4 mCi Technetium-27m DTPA aerosol inhalation and 4.15 mCi Technetium-46m MAA IV COMPARISON:  No prior nuclear imaging.  Two-view chest x-ray yesterday. FINDINGS: Ventilation: Central airway aerosol deposition. No focal ventilation defect. Perfusion: No wedge shaped peripheral perfusion defects to suggest acute pulmonary embolism. Homogeneous perfusion throughout both lungs. IMPRESSION: Normal examination. Electronically Signed   By: Evangeline Dakin M.D.   On: 04/05/2016 12:46   Dg Chest Port 1 View  Result Date: 04/08/2016 CLINICAL DATA:  Chest pain today EXAM: PORTABLE CHEST 1 VIEW COMPARISON:  04/04/2016 FINDINGS: The heart remains moderately enlarged. Vascular congestion is stable. Scattered bilateral mid and lower lung zone atelectasis persists. No pneumothorax. No pulmonary edema. IMPRESSION: Stable cardiomegaly and vascular congestion. Electronically Signed   By: Marybelle Killings M.D.   On: 04/08/2016 11:27     PERTINENT LAB RESULTS: CBC:  Recent Labs  04/09/16 0144 04/10/16 0449  WBC 9.1 10.9*  HGB 9.6* 10.4*  HCT 31.9* 33.9*  PLT 320 357   CMET CMP     Component Value Date/Time   NA 142 04/09/2016  1853   K 4.0 04/09/2016 1853   CL 106 04/09/2016 1853   CO2 28 04/09/2016 1853   GLUCOSE 134 (H) 04/09/2016 1853   BUN 19 04/09/2016 1853   CREATININE 1.79 (H) 04/09/2016 1853   CALCIUM 8.7 (L) 04/09/2016 1853   PROT 5.5 (L) 04/06/2016 0309   ALBUMIN 2.3 (L) 04/06/2016 0309   AST 13 (L) 04/06/2016 0309   ALT 13 (L) 04/06/2016 0309   ALKPHOS 99 04/06/2016 0309   BILITOT 0.5 04/06/2016 0309   GFRNONAA 27 (L) 04/09/2016 1853   GFRAA 31 (L) 04/09/2016 1853    GFR Estimated Creatinine Clearance: 30 mL/min (by C-G formula based on SCr of 1.79 mg/dL (H)). No results for input(s): LIPASE, AMYLASE in the last 72 hours.  Recent Labs  04/08/16 1411 04/08/16 1859 04/09/16 0144  TROPONINI 0.05* 0.15* 0.20*   Invalid input(s): POCBNP No results for input(s): DDIMER in the last 72 hours. No results for input(s): HGBA1C in the last 72 hours. No results for input(s): CHOL, HDL, LDLCALC, TRIG,  CHOLHDL, LDLDIRECT in the last 72 hours.  Recent Labs  04/08/16 1411  TSH 1.684   No results for input(s): VITAMINB12, FOLATE, FERRITIN, TIBC, IRON, RETICCTPCT in the last 72 hours. Coags: No results for input(s): INR in the last 72 hours.  Invalid input(s): PT Microbiology: No results found for this or any previous visit (from the past 240 hour(s)).  FURTHER DISCHARGE INSTRUCTIONS:  Get Medicines reviewed and adjusted: Please take all your medications with you for your next visit with your Primary MD  Laboratory/radiological data: Please request your Primary MD to go over all hospital tests and procedure/radiological results at the follow up, please ask your Primary MD to get all Hospital records sent to his/her office.  In some cases, they will be blood work, cultures and biopsy results pending at the time of your discharge. Please request that your primary care M.D. goes through all the records of your hospital data and follows up on these results.  Also Note the following: If you experience worsening of your admission symptoms, develop shortness of breath, life threatening emergency, suicidal or homicidal thoughts you must seek medical attention immediately by calling 911 or calling your MD immediately  if symptoms less severe.  You must read complete instructions/literature along with all the possible adverse reactions/side effects for all the Medicines you take and that have been prescribed to you. Take any new Medicines after you have completely understood and accpet all the possible adverse reactions/side effects.   Do not drive when taking Pain medications or sleeping medications (Benzodaizepines)  Do not take more than prescribed Pain, Sleep and Anxiety Medications. It is not advisable to combine anxiety,sleep and pain medications without talking with your primary care practitioner  Special Instructions: If you have smoked or chewed Tobacco  in the last 2 yrs please stop  smoking, stop any regular Alcohol  and or any Recreational drug use.  Wear Seat belts while driving.  Please note: You were cared for by a hospitalist during your hospital stay. Once you are discharged, your primary care physician will handle any further medical issues. Please note that NO REFILLS for any discharge medications will be authorized once you are discharged, as it is imperative that you return to your primary care physician (or establish a relationship with a primary care physician if you do not have one) for your post hospital discharge needs so that they can reassess your need for medications and monitor your lab  values.  Total Time spent coordinating discharge including counseling, education and face to face time equals 45 minutes.  SignedOren Binet 04/10/2016 11:17 AM

## 2016-04-12 DIAGNOSIS — J449 Chronic obstructive pulmonary disease, unspecified: Secondary | ICD-10-CM | POA: Diagnosis not present

## 2016-04-12 DIAGNOSIS — R001 Bradycardia, unspecified: Secondary | ICD-10-CM | POA: Diagnosis not present

## 2016-04-12 DIAGNOSIS — N189 Chronic kidney disease, unspecified: Secondary | ICD-10-CM | POA: Diagnosis not present

## 2016-04-12 NOTE — Care Management (Signed)
1125 post d/c note: 04-12-16 CM did speak with patient in regards to disposition needs. [facility] was not able to take the patient. CM did call patient and she states it is ok to utilize [facility] for Services. CM did make referral with [facility] for RN and PT Services. SOC to begin within 24-48 hours post d/c. CM did offer to call the daughter and pt stated not to do so she would relay message. No further needs from CM at this time. Bethena Roys, RN,BSN [phone]

## 2016-04-13 DIAGNOSIS — Z87891 Personal history of nicotine dependence: Secondary | ICD-10-CM | POA: Diagnosis not present

## 2016-04-13 DIAGNOSIS — J449 Chronic obstructive pulmonary disease, unspecified: Secondary | ICD-10-CM | POA: Diagnosis not present

## 2016-04-13 DIAGNOSIS — I130 Hypertensive heart and chronic kidney disease with heart failure and stage 1 through stage 4 chronic kidney disease, or unspecified chronic kidney disease: Secondary | ICD-10-CM | POA: Diagnosis not present

## 2016-04-13 DIAGNOSIS — I4891 Unspecified atrial fibrillation: Secondary | ICD-10-CM | POA: Diagnosis not present

## 2016-04-13 DIAGNOSIS — N183 Chronic kidney disease, stage 3 (moderate): Secondary | ICD-10-CM | POA: Diagnosis not present

## 2016-04-13 DIAGNOSIS — I872 Venous insufficiency (chronic) (peripheral): Secondary | ICD-10-CM | POA: Diagnosis not present

## 2016-04-13 DIAGNOSIS — E785 Hyperlipidemia, unspecified: Secondary | ICD-10-CM | POA: Diagnosis not present

## 2016-04-13 DIAGNOSIS — E1122 Type 2 diabetes mellitus with diabetic chronic kidney disease: Secondary | ICD-10-CM | POA: Diagnosis not present

## 2016-04-13 DIAGNOSIS — Z794 Long term (current) use of insulin: Secondary | ICD-10-CM | POA: Diagnosis not present

## 2016-04-13 DIAGNOSIS — I5032 Chronic diastolic (congestive) heart failure: Secondary | ICD-10-CM | POA: Diagnosis not present

## 2016-04-13 DIAGNOSIS — Z7951 Long term (current) use of inhaled steroids: Secondary | ICD-10-CM | POA: Diagnosis not present

## 2016-04-13 DIAGNOSIS — Z905 Acquired absence of kidney: Secondary | ICD-10-CM | POA: Diagnosis not present

## 2016-04-16 DIAGNOSIS — R0602 Shortness of breath: Secondary | ICD-10-CM | POA: Diagnosis not present

## 2016-04-16 DIAGNOSIS — N183 Chronic kidney disease, stage 3 (moderate): Secondary | ICD-10-CM | POA: Diagnosis not present

## 2016-04-16 DIAGNOSIS — I119 Hypertensive heart disease without heart failure: Secondary | ICD-10-CM | POA: Diagnosis not present

## 2016-04-16 DIAGNOSIS — E119 Type 2 diabetes mellitus without complications: Secondary | ICD-10-CM | POA: Diagnosis not present

## 2016-04-16 DIAGNOSIS — E1121 Type 2 diabetes mellitus with diabetic nephropathy: Secondary | ICD-10-CM | POA: Diagnosis not present

## 2016-04-16 DIAGNOSIS — E785 Hyperlipidemia, unspecified: Secondary | ICD-10-CM | POA: Diagnosis not present

## 2016-04-16 DIAGNOSIS — I5032 Chronic diastolic (congestive) heart failure: Secondary | ICD-10-CM | POA: Diagnosis not present

## 2016-04-16 DIAGNOSIS — J449 Chronic obstructive pulmonary disease, unspecified: Secondary | ICD-10-CM | POA: Diagnosis not present

## 2016-04-16 DIAGNOSIS — I480 Paroxysmal atrial fibrillation: Secondary | ICD-10-CM | POA: Diagnosis not present

## 2016-04-16 DIAGNOSIS — E668 Other obesity: Secondary | ICD-10-CM | POA: Diagnosis not present

## 2016-04-20 DIAGNOSIS — D72829 Elevated white blood cell count, unspecified: Secondary | ICD-10-CM | POA: Diagnosis not present

## 2016-04-20 DIAGNOSIS — T7840XA Allergy, unspecified, initial encounter: Secondary | ICD-10-CM | POA: Diagnosis not present

## 2016-04-20 DIAGNOSIS — I10 Essential (primary) hypertension: Secondary | ICD-10-CM | POA: Diagnosis not present

## 2016-04-20 DIAGNOSIS — E1121 Type 2 diabetes mellitus with diabetic nephropathy: Secondary | ICD-10-CM | POA: Diagnosis not present

## 2016-04-20 DIAGNOSIS — I4891 Unspecified atrial fibrillation: Secondary | ICD-10-CM | POA: Diagnosis not present

## 2016-04-23 DIAGNOSIS — R269 Unspecified abnormalities of gait and mobility: Secondary | ICD-10-CM | POA: Diagnosis not present

## 2016-04-27 DIAGNOSIS — Z905 Acquired absence of kidney: Secondary | ICD-10-CM | POA: Diagnosis not present

## 2016-04-27 DIAGNOSIS — I130 Hypertensive heart and chronic kidney disease with heart failure and stage 1 through stage 4 chronic kidney disease, or unspecified chronic kidney disease: Secondary | ICD-10-CM | POA: Diagnosis not present

## 2016-04-27 DIAGNOSIS — I5032 Chronic diastolic (congestive) heart failure: Secondary | ICD-10-CM | POA: Diagnosis not present

## 2016-04-27 DIAGNOSIS — Z7951 Long term (current) use of inhaled steroids: Secondary | ICD-10-CM | POA: Diagnosis not present

## 2016-04-27 DIAGNOSIS — J449 Chronic obstructive pulmonary disease, unspecified: Secondary | ICD-10-CM | POA: Diagnosis not present

## 2016-04-27 DIAGNOSIS — E785 Hyperlipidemia, unspecified: Secondary | ICD-10-CM | POA: Diagnosis not present

## 2016-04-27 DIAGNOSIS — E1122 Type 2 diabetes mellitus with diabetic chronic kidney disease: Secondary | ICD-10-CM | POA: Diagnosis not present

## 2016-04-27 DIAGNOSIS — Z794 Long term (current) use of insulin: Secondary | ICD-10-CM | POA: Diagnosis not present

## 2016-04-27 DIAGNOSIS — Z87891 Personal history of nicotine dependence: Secondary | ICD-10-CM | POA: Diagnosis not present

## 2016-04-27 DIAGNOSIS — I4891 Unspecified atrial fibrillation: Secondary | ICD-10-CM | POA: Diagnosis not present

## 2016-04-27 DIAGNOSIS — I872 Venous insufficiency (chronic) (peripheral): Secondary | ICD-10-CM | POA: Diagnosis not present

## 2016-04-27 DIAGNOSIS — N183 Chronic kidney disease, stage 3 (moderate): Secondary | ICD-10-CM | POA: Diagnosis not present

## 2016-05-05 DIAGNOSIS — Z87891 Personal history of nicotine dependence: Secondary | ICD-10-CM | POA: Diagnosis not present

## 2016-05-05 DIAGNOSIS — N183 Chronic kidney disease, stage 3 (moderate): Secondary | ICD-10-CM | POA: Diagnosis not present

## 2016-05-05 DIAGNOSIS — Z7951 Long term (current) use of inhaled steroids: Secondary | ICD-10-CM | POA: Diagnosis not present

## 2016-05-05 DIAGNOSIS — E785 Hyperlipidemia, unspecified: Secondary | ICD-10-CM | POA: Diagnosis not present

## 2016-05-05 DIAGNOSIS — Z905 Acquired absence of kidney: Secondary | ICD-10-CM | POA: Diagnosis not present

## 2016-05-05 DIAGNOSIS — I4891 Unspecified atrial fibrillation: Secondary | ICD-10-CM | POA: Diagnosis not present

## 2016-05-05 DIAGNOSIS — I872 Venous insufficiency (chronic) (peripheral): Secondary | ICD-10-CM | POA: Diagnosis not present

## 2016-05-05 DIAGNOSIS — I130 Hypertensive heart and chronic kidney disease with heart failure and stage 1 through stage 4 chronic kidney disease, or unspecified chronic kidney disease: Secondary | ICD-10-CM | POA: Diagnosis not present

## 2016-05-05 DIAGNOSIS — I5032 Chronic diastolic (congestive) heart failure: Secondary | ICD-10-CM | POA: Diagnosis not present

## 2016-05-05 DIAGNOSIS — E1122 Type 2 diabetes mellitus with diabetic chronic kidney disease: Secondary | ICD-10-CM | POA: Diagnosis not present

## 2016-05-05 DIAGNOSIS — Z794 Long term (current) use of insulin: Secondary | ICD-10-CM | POA: Diagnosis not present

## 2016-05-05 DIAGNOSIS — J449 Chronic obstructive pulmonary disease, unspecified: Secondary | ICD-10-CM | POA: Diagnosis not present

## 2016-05-10 DIAGNOSIS — J449 Chronic obstructive pulmonary disease, unspecified: Secondary | ICD-10-CM | POA: Diagnosis not present

## 2016-05-10 DIAGNOSIS — R001 Bradycardia, unspecified: Secondary | ICD-10-CM | POA: Diagnosis not present

## 2016-05-10 DIAGNOSIS — N189 Chronic kidney disease, unspecified: Secondary | ICD-10-CM | POA: Diagnosis not present

## 2016-05-14 DIAGNOSIS — I119 Hypertensive heart disease without heart failure: Secondary | ICD-10-CM | POA: Diagnosis not present

## 2016-05-14 DIAGNOSIS — R0602 Shortness of breath: Secondary | ICD-10-CM | POA: Diagnosis not present

## 2016-05-14 DIAGNOSIS — N183 Chronic kidney disease, stage 3 (moderate): Secondary | ICD-10-CM | POA: Diagnosis not present

## 2016-05-14 DIAGNOSIS — E119 Type 2 diabetes mellitus without complications: Secondary | ICD-10-CM | POA: Diagnosis not present

## 2016-05-14 DIAGNOSIS — E1121 Type 2 diabetes mellitus with diabetic nephropathy: Secondary | ICD-10-CM | POA: Diagnosis not present

## 2016-05-14 DIAGNOSIS — J449 Chronic obstructive pulmonary disease, unspecified: Secondary | ICD-10-CM | POA: Diagnosis not present

## 2016-05-14 DIAGNOSIS — I5032 Chronic diastolic (congestive) heart failure: Secondary | ICD-10-CM | POA: Diagnosis not present

## 2016-05-14 DIAGNOSIS — E668 Other obesity: Secondary | ICD-10-CM | POA: Diagnosis not present

## 2016-05-14 DIAGNOSIS — E785 Hyperlipidemia, unspecified: Secondary | ICD-10-CM | POA: Diagnosis not present

## 2016-05-14 DIAGNOSIS — I480 Paroxysmal atrial fibrillation: Secondary | ICD-10-CM | POA: Diagnosis not present

## 2016-05-20 DIAGNOSIS — N179 Acute kidney failure, unspecified: Secondary | ICD-10-CM | POA: Diagnosis not present

## 2016-05-20 DIAGNOSIS — I10 Essential (primary) hypertension: Secondary | ICD-10-CM | POA: Diagnosis not present

## 2016-05-20 DIAGNOSIS — R0600 Dyspnea, unspecified: Secondary | ICD-10-CM | POA: Diagnosis not present

## 2016-05-20 DIAGNOSIS — N183 Chronic kidney disease, stage 3 (moderate): Secondary | ICD-10-CM | POA: Diagnosis not present

## 2016-05-20 DIAGNOSIS — E875 Hyperkalemia: Secondary | ICD-10-CM | POA: Diagnosis not present

## 2016-05-20 DIAGNOSIS — R140 Abdominal distension (gaseous): Secondary | ICD-10-CM | POA: Diagnosis not present

## 2016-05-20 DIAGNOSIS — E1129 Type 2 diabetes mellitus with other diabetic kidney complication: Secondary | ICD-10-CM | POA: Diagnosis not present

## 2016-05-31 DIAGNOSIS — M533 Sacrococcygeal disorders, not elsewhere classified: Secondary | ICD-10-CM | POA: Diagnosis not present

## 2016-05-31 DIAGNOSIS — M5416 Radiculopathy, lumbar region: Secondary | ICD-10-CM | POA: Diagnosis not present

## 2016-05-31 DIAGNOSIS — M47817 Spondylosis without myelopathy or radiculopathy, lumbosacral region: Secondary | ICD-10-CM | POA: Diagnosis not present

## 2016-05-31 DIAGNOSIS — M791 Myalgia: Secondary | ICD-10-CM | POA: Diagnosis not present

## 2016-06-02 DIAGNOSIS — Z905 Acquired absence of kidney: Secondary | ICD-10-CM | POA: Diagnosis not present

## 2016-06-02 DIAGNOSIS — Z794 Long term (current) use of insulin: Secondary | ICD-10-CM | POA: Diagnosis not present

## 2016-06-02 DIAGNOSIS — I4891 Unspecified atrial fibrillation: Secondary | ICD-10-CM | POA: Diagnosis not present

## 2016-06-02 DIAGNOSIS — I130 Hypertensive heart and chronic kidney disease with heart failure and stage 1 through stage 4 chronic kidney disease, or unspecified chronic kidney disease: Secondary | ICD-10-CM | POA: Diagnosis not present

## 2016-06-02 DIAGNOSIS — Z7951 Long term (current) use of inhaled steroids: Secondary | ICD-10-CM | POA: Diagnosis not present

## 2016-06-02 DIAGNOSIS — Z87891 Personal history of nicotine dependence: Secondary | ICD-10-CM | POA: Diagnosis not present

## 2016-06-02 DIAGNOSIS — N183 Chronic kidney disease, stage 3 (moderate): Secondary | ICD-10-CM | POA: Diagnosis not present

## 2016-06-02 DIAGNOSIS — I872 Venous insufficiency (chronic) (peripheral): Secondary | ICD-10-CM | POA: Diagnosis not present

## 2016-06-02 DIAGNOSIS — E1122 Type 2 diabetes mellitus with diabetic chronic kidney disease: Secondary | ICD-10-CM | POA: Diagnosis not present

## 2016-06-02 DIAGNOSIS — I5032 Chronic diastolic (congestive) heart failure: Secondary | ICD-10-CM | POA: Diagnosis not present

## 2016-06-02 DIAGNOSIS — E785 Hyperlipidemia, unspecified: Secondary | ICD-10-CM | POA: Diagnosis not present

## 2016-06-02 DIAGNOSIS — J449 Chronic obstructive pulmonary disease, unspecified: Secondary | ICD-10-CM | POA: Diagnosis not present

## 2016-06-06 DIAGNOSIS — I2510 Atherosclerotic heart disease of native coronary artery without angina pectoris: Secondary | ICD-10-CM | POA: Diagnosis not present

## 2016-06-06 DIAGNOSIS — L97523 Non-pressure chronic ulcer of other part of left foot with necrosis of muscle: Secondary | ICD-10-CM | POA: Diagnosis not present

## 2016-06-06 DIAGNOSIS — N183 Chronic kidney disease, stage 3 (moderate): Secondary | ICD-10-CM | POA: Diagnosis not present

## 2016-06-06 DIAGNOSIS — I739 Peripheral vascular disease, unspecified: Secondary | ICD-10-CM | POA: Diagnosis not present

## 2016-06-06 DIAGNOSIS — K219 Gastro-esophageal reflux disease without esophagitis: Secondary | ICD-10-CM | POA: Diagnosis not present

## 2016-06-06 DIAGNOSIS — I129 Hypertensive chronic kidney disease with stage 1 through stage 4 chronic kidney disease, or unspecified chronic kidney disease: Secondary | ICD-10-CM | POA: Diagnosis not present

## 2016-06-06 DIAGNOSIS — M6281 Muscle weakness (generalized): Secondary | ICD-10-CM | POA: Diagnosis not present

## 2016-06-06 DIAGNOSIS — Z7982 Long term (current) use of aspirin: Secondary | ICD-10-CM | POA: Diagnosis not present

## 2016-06-06 DIAGNOSIS — E1122 Type 2 diabetes mellitus with diabetic chronic kidney disease: Secondary | ICD-10-CM | POA: Diagnosis not present

## 2016-06-06 DIAGNOSIS — E11621 Type 2 diabetes mellitus with foot ulcer: Secondary | ICD-10-CM | POA: Diagnosis not present

## 2016-06-06 DIAGNOSIS — Z794 Long term (current) use of insulin: Secondary | ICD-10-CM | POA: Diagnosis not present

## 2016-06-08 DIAGNOSIS — N183 Chronic kidney disease, stage 3 (moderate): Secondary | ICD-10-CM | POA: Diagnosis not present

## 2016-06-21 DIAGNOSIS — R0600 Dyspnea, unspecified: Secondary | ICD-10-CM | POA: Diagnosis not present

## 2016-06-21 DIAGNOSIS — I10 Essential (primary) hypertension: Secondary | ICD-10-CM | POA: Diagnosis not present

## 2016-06-21 DIAGNOSIS — E669 Obesity, unspecified: Secondary | ICD-10-CM | POA: Diagnosis not present

## 2016-06-21 DIAGNOSIS — N183 Chronic kidney disease, stage 3 (moderate): Secondary | ICD-10-CM | POA: Diagnosis not present

## 2016-06-21 DIAGNOSIS — E875 Hyperkalemia: Secondary | ICD-10-CM | POA: Diagnosis not present

## 2016-06-21 DIAGNOSIS — E1129 Type 2 diabetes mellitus with other diabetic kidney complication: Secondary | ICD-10-CM | POA: Diagnosis not present

## 2016-06-21 DIAGNOSIS — N179 Acute kidney failure, unspecified: Secondary | ICD-10-CM | POA: Diagnosis not present

## 2016-06-28 ENCOUNTER — Other Ambulatory Visit: Payer: Self-pay | Admitting: Cardiology

## 2016-06-28 ENCOUNTER — Ambulatory Visit
Admission: RE | Admit: 2016-06-28 | Discharge: 2016-06-28 | Disposition: A | Discharge status: Home / Self Care | Payer: PPO | Source: Ambulatory Visit | Attending: Cardiology | Admitting: Cardiology

## 2016-06-28 DIAGNOSIS — I119 Hypertensive heart disease without heart failure: Secondary | ICD-10-CM | POA: Diagnosis not present

## 2016-06-28 DIAGNOSIS — R0602 Shortness of breath: Secondary | ICD-10-CM

## 2016-06-28 DIAGNOSIS — R05 Cough: Secondary | ICD-10-CM | POA: Diagnosis not present

## 2016-06-28 DIAGNOSIS — E785 Hyperlipidemia, unspecified: Secondary | ICD-10-CM | POA: Diagnosis not present

## 2016-06-28 DIAGNOSIS — E668 Other obesity: Secondary | ICD-10-CM | POA: Diagnosis not present

## 2016-06-28 DIAGNOSIS — J449 Chronic obstructive pulmonary disease, unspecified: Secondary | ICD-10-CM | POA: Diagnosis not present

## 2016-06-28 DIAGNOSIS — N183 Chronic kidney disease, stage 3 (moderate): Secondary | ICD-10-CM | POA: Diagnosis not present

## 2016-06-28 DIAGNOSIS — I5032 Chronic diastolic (congestive) heart failure: Secondary | ICD-10-CM | POA: Diagnosis not present

## 2016-06-28 DIAGNOSIS — I480 Paroxysmal atrial fibrillation: Secondary | ICD-10-CM | POA: Diagnosis not present

## 2016-06-28 DIAGNOSIS — E119 Type 2 diabetes mellitus without complications: Secondary | ICD-10-CM | POA: Diagnosis not present

## 2016-06-28 DIAGNOSIS — E1121 Type 2 diabetes mellitus with diabetic nephropathy: Secondary | ICD-10-CM | POA: Diagnosis not present

## 2016-07-02 DIAGNOSIS — M179 Osteoarthritis of knee, unspecified: Secondary | ICD-10-CM | POA: Diagnosis not present

## 2016-07-02 DIAGNOSIS — J449 Chronic obstructive pulmonary disease, unspecified: Secondary | ICD-10-CM | POA: Diagnosis not present

## 2016-07-02 DIAGNOSIS — I8310 Varicose veins of unspecified lower extremity with inflammation: Secondary | ICD-10-CM | POA: Diagnosis not present

## 2016-07-02 DIAGNOSIS — J069 Acute upper respiratory infection, unspecified: Secondary | ICD-10-CM | POA: Diagnosis not present

## 2016-07-02 DIAGNOSIS — E7800 Pure hypercholesterolemia, unspecified: Secondary | ICD-10-CM | POA: Diagnosis not present

## 2016-07-02 DIAGNOSIS — I10 Essential (primary) hypertension: Secondary | ICD-10-CM | POA: Diagnosis not present

## 2016-07-02 DIAGNOSIS — E1121 Type 2 diabetes mellitus with diabetic nephropathy: Secondary | ICD-10-CM | POA: Diagnosis not present

## 2016-07-02 DIAGNOSIS — Z794 Long term (current) use of insulin: Secondary | ICD-10-CM | POA: Diagnosis not present

## 2016-07-09 DIAGNOSIS — M545 Low back pain: Secondary | ICD-10-CM | POA: Diagnosis not present

## 2016-07-09 DIAGNOSIS — M5137 Other intervertebral disc degeneration, lumbosacral region: Secondary | ICD-10-CM | POA: Diagnosis not present

## 2016-07-09 DIAGNOSIS — Z79891 Long term (current) use of opiate analgesic: Secondary | ICD-10-CM | POA: Diagnosis not present

## 2016-07-09 DIAGNOSIS — G894 Chronic pain syndrome: Secondary | ICD-10-CM | POA: Diagnosis not present

## 2016-07-09 DIAGNOSIS — M47817 Spondylosis without myelopathy or radiculopathy, lumbosacral region: Secondary | ICD-10-CM | POA: Diagnosis not present

## 2016-07-09 DIAGNOSIS — M48061 Spinal stenosis, lumbar region without neurogenic claudication: Secondary | ICD-10-CM | POA: Diagnosis not present

## 2016-07-09 DIAGNOSIS — M791 Myalgia: Secondary | ICD-10-CM | POA: Diagnosis not present

## 2016-07-09 DIAGNOSIS — M5416 Radiculopathy, lumbar region: Secondary | ICD-10-CM | POA: Diagnosis not present

## 2016-07-09 DIAGNOSIS — M533 Sacrococcygeal disorders, not elsewhere classified: Secondary | ICD-10-CM | POA: Diagnosis not present

## 2016-07-09 DIAGNOSIS — M47816 Spondylosis without myelopathy or radiculopathy, lumbar region: Secondary | ICD-10-CM | POA: Diagnosis not present

## 2016-07-09 DIAGNOSIS — M5136 Other intervertebral disc degeneration, lumbar region: Secondary | ICD-10-CM | POA: Diagnosis not present

## 2016-07-09 DIAGNOSIS — E0843 Diabetes mellitus due to underlying condition with diabetic autonomic (poly)neuropathy: Secondary | ICD-10-CM | POA: Diagnosis not present

## 2016-07-13 DIAGNOSIS — E785 Hyperlipidemia, unspecified: Secondary | ICD-10-CM | POA: Diagnosis not present

## 2016-07-13 DIAGNOSIS — I5032 Chronic diastolic (congestive) heart failure: Secondary | ICD-10-CM | POA: Diagnosis not present

## 2016-07-13 DIAGNOSIS — E1121 Type 2 diabetes mellitus with diabetic nephropathy: Secondary | ICD-10-CM | POA: Diagnosis not present

## 2016-07-13 DIAGNOSIS — E119 Type 2 diabetes mellitus without complications: Secondary | ICD-10-CM | POA: Diagnosis not present

## 2016-07-13 DIAGNOSIS — N183 Chronic kidney disease, stage 3 (moderate): Secondary | ICD-10-CM | POA: Diagnosis not present

## 2016-07-13 DIAGNOSIS — I480 Paroxysmal atrial fibrillation: Secondary | ICD-10-CM | POA: Diagnosis not present

## 2016-07-13 DIAGNOSIS — R0602 Shortness of breath: Secondary | ICD-10-CM | POA: Diagnosis not present

## 2016-07-13 DIAGNOSIS — E668 Other obesity: Secondary | ICD-10-CM | POA: Diagnosis not present

## 2016-07-13 DIAGNOSIS — J449 Chronic obstructive pulmonary disease, unspecified: Secondary | ICD-10-CM | POA: Diagnosis not present

## 2016-07-13 DIAGNOSIS — I119 Hypertensive heart disease without heart failure: Secondary | ICD-10-CM | POA: Diagnosis not present

## 2016-07-16 DIAGNOSIS — H401131 Primary open-angle glaucoma, bilateral, mild stage: Secondary | ICD-10-CM | POA: Diagnosis not present

## 2016-07-16 DIAGNOSIS — E119 Type 2 diabetes mellitus without complications: Secondary | ICD-10-CM | POA: Diagnosis not present

## 2016-07-16 DIAGNOSIS — Z961 Presence of intraocular lens: Secondary | ICD-10-CM | POA: Diagnosis not present

## 2016-08-16 DIAGNOSIS — M47817 Spondylosis without myelopathy or radiculopathy, lumbosacral region: Secondary | ICD-10-CM | POA: Diagnosis not present

## 2016-08-16 DIAGNOSIS — M533 Sacrococcygeal disorders, not elsewhere classified: Secondary | ICD-10-CM | POA: Diagnosis not present

## 2016-08-16 DIAGNOSIS — M791 Myalgia: Secondary | ICD-10-CM | POA: Diagnosis not present

## 2016-08-16 DIAGNOSIS — M5416 Radiculopathy, lumbar region: Secondary | ICD-10-CM | POA: Diagnosis not present

## 2016-09-14 DIAGNOSIS — R0602 Shortness of breath: Secondary | ICD-10-CM | POA: Diagnosis not present

## 2016-09-15 DIAGNOSIS — E875 Hyperkalemia: Secondary | ICD-10-CM | POA: Diagnosis not present

## 2016-09-15 DIAGNOSIS — E1129 Type 2 diabetes mellitus with other diabetic kidney complication: Secondary | ICD-10-CM | POA: Diagnosis not present

## 2016-09-15 DIAGNOSIS — I10 Essential (primary) hypertension: Secondary | ICD-10-CM | POA: Diagnosis not present

## 2016-09-15 DIAGNOSIS — R0600 Dyspnea, unspecified: Secondary | ICD-10-CM | POA: Diagnosis not present

## 2016-09-15 DIAGNOSIS — N179 Acute kidney failure, unspecified: Secondary | ICD-10-CM | POA: Diagnosis not present

## 2016-09-15 DIAGNOSIS — E669 Obesity, unspecified: Secondary | ICD-10-CM | POA: Diagnosis not present

## 2016-09-15 DIAGNOSIS — N183 Chronic kidney disease, stage 3 (moderate): Secondary | ICD-10-CM | POA: Diagnosis not present

## 2016-10-19 DIAGNOSIS — M791 Myalgia: Secondary | ICD-10-CM | POA: Diagnosis not present

## 2016-10-19 DIAGNOSIS — M5416 Radiculopathy, lumbar region: Secondary | ICD-10-CM | POA: Diagnosis not present

## 2016-11-12 DIAGNOSIS — M25561 Pain in right knee: Secondary | ICD-10-CM | POA: Diagnosis not present

## 2016-11-16 DIAGNOSIS — M5416 Radiculopathy, lumbar region: Secondary | ICD-10-CM | POA: Diagnosis not present

## 2016-11-16 DIAGNOSIS — M791 Myalgia: Secondary | ICD-10-CM | POA: Diagnosis not present

## 2016-12-27 DIAGNOSIS — I480 Paroxysmal atrial fibrillation: Secondary | ICD-10-CM | POA: Diagnosis not present

## 2016-12-27 DIAGNOSIS — R0602 Shortness of breath: Secondary | ICD-10-CM | POA: Diagnosis not present

## 2017-01-03 DIAGNOSIS — R0600 Dyspnea, unspecified: Secondary | ICD-10-CM | POA: Diagnosis not present

## 2017-01-03 DIAGNOSIS — E1122 Type 2 diabetes mellitus with diabetic chronic kidney disease: Secondary | ICD-10-CM | POA: Diagnosis not present

## 2017-01-03 DIAGNOSIS — N183 Chronic kidney disease, stage 3 (moderate): Secondary | ICD-10-CM | POA: Diagnosis not present

## 2017-01-03 DIAGNOSIS — E669 Obesity, unspecified: Secondary | ICD-10-CM | POA: Diagnosis not present

## 2017-01-03 DIAGNOSIS — E875 Hyperkalemia: Secondary | ICD-10-CM | POA: Diagnosis not present

## 2017-01-03 DIAGNOSIS — N179 Acute kidney failure, unspecified: Secondary | ICD-10-CM | POA: Diagnosis not present

## 2017-01-03 DIAGNOSIS — I129 Hypertensive chronic kidney disease with stage 1 through stage 4 chronic kidney disease, or unspecified chronic kidney disease: Secondary | ICD-10-CM | POA: Diagnosis not present

## 2017-01-03 DIAGNOSIS — D631 Anemia in chronic kidney disease: Secondary | ICD-10-CM | POA: Diagnosis not present

## 2017-01-04 DIAGNOSIS — R7301 Impaired fasting glucose: Secondary | ICD-10-CM | POA: Diagnosis not present

## 2017-01-04 DIAGNOSIS — N760 Acute vaginitis: Secondary | ICD-10-CM | POA: Diagnosis not present

## 2017-01-04 DIAGNOSIS — I8310 Varicose veins of unspecified lower extremity with inflammation: Secondary | ICD-10-CM | POA: Diagnosis not present

## 2017-01-04 DIAGNOSIS — J449 Chronic obstructive pulmonary disease, unspecified: Secondary | ICD-10-CM | POA: Diagnosis not present

## 2017-01-04 DIAGNOSIS — I10 Essential (primary) hypertension: Secondary | ICD-10-CM | POA: Diagnosis not present

## 2017-01-04 DIAGNOSIS — Z794 Long term (current) use of insulin: Secondary | ICD-10-CM | POA: Diagnosis not present

## 2017-01-04 DIAGNOSIS — E7800 Pure hypercholesterolemia, unspecified: Secondary | ICD-10-CM | POA: Diagnosis not present

## 2017-01-04 DIAGNOSIS — E1121 Type 2 diabetes mellitus with diabetic nephropathy: Secondary | ICD-10-CM | POA: Diagnosis not present

## 2017-01-04 DIAGNOSIS — M179 Osteoarthritis of knee, unspecified: Secondary | ICD-10-CM | POA: Diagnosis not present

## 2017-01-17 DIAGNOSIS — M5136 Other intervertebral disc degeneration, lumbar region: Secondary | ICD-10-CM | POA: Diagnosis not present

## 2017-01-17 DIAGNOSIS — M259 Joint disorder, unspecified: Secondary | ICD-10-CM | POA: Diagnosis not present

## 2017-01-17 DIAGNOSIS — M545 Low back pain: Secondary | ICD-10-CM | POA: Diagnosis not present

## 2017-01-17 DIAGNOSIS — Z5181 Encounter for therapeutic drug level monitoring: Secondary | ICD-10-CM | POA: Diagnosis not present

## 2017-02-16 DIAGNOSIS — M545 Low back pain: Secondary | ICD-10-CM | POA: Diagnosis not present

## 2017-02-16 DIAGNOSIS — Z5181 Encounter for therapeutic drug level monitoring: Secondary | ICD-10-CM | POA: Diagnosis not present

## 2017-02-16 DIAGNOSIS — M259 Joint disorder, unspecified: Secondary | ICD-10-CM | POA: Diagnosis not present

## 2017-02-16 DIAGNOSIS — M5136 Other intervertebral disc degeneration, lumbar region: Secondary | ICD-10-CM | POA: Diagnosis not present

## 2017-03-14 DIAGNOSIS — M545 Low back pain: Secondary | ICD-10-CM | POA: Diagnosis not present

## 2017-03-14 DIAGNOSIS — Z5181 Encounter for therapeutic drug level monitoring: Secondary | ICD-10-CM | POA: Diagnosis not present

## 2017-03-14 DIAGNOSIS — M5136 Other intervertebral disc degeneration, lumbar region: Secondary | ICD-10-CM | POA: Diagnosis not present

## 2017-03-14 DIAGNOSIS — M259 Joint disorder, unspecified: Secondary | ICD-10-CM | POA: Diagnosis not present

## 2017-04-11 DIAGNOSIS — Z5181 Encounter for therapeutic drug level monitoring: Secondary | ICD-10-CM | POA: Diagnosis not present

## 2017-04-11 DIAGNOSIS — M545 Low back pain: Secondary | ICD-10-CM | POA: Diagnosis not present

## 2017-04-11 DIAGNOSIS — M5136 Other intervertebral disc degeneration, lumbar region: Secondary | ICD-10-CM | POA: Diagnosis not present

## 2017-04-11 DIAGNOSIS — M259 Joint disorder, unspecified: Secondary | ICD-10-CM | POA: Diagnosis not present

## 2017-04-18 DIAGNOSIS — E1121 Type 2 diabetes mellitus with diabetic nephropathy: Secondary | ICD-10-CM | POA: Diagnosis not present

## 2017-04-18 DIAGNOSIS — R5383 Other fatigue: Secondary | ICD-10-CM | POA: Diagnosis not present

## 2017-04-18 DIAGNOSIS — Z794 Long term (current) use of insulin: Secondary | ICD-10-CM | POA: Diagnosis not present

## 2017-04-18 DIAGNOSIS — N760 Acute vaginitis: Secondary | ICD-10-CM | POA: Diagnosis not present

## 2017-05-09 DIAGNOSIS — M5136 Other intervertebral disc degeneration, lumbar region: Secondary | ICD-10-CM | POA: Diagnosis not present

## 2017-05-09 DIAGNOSIS — M545 Low back pain: Secondary | ICD-10-CM | POA: Diagnosis not present

## 2017-05-09 DIAGNOSIS — M259 Joint disorder, unspecified: Secondary | ICD-10-CM | POA: Diagnosis not present

## 2017-05-09 DIAGNOSIS — Z5181 Encounter for therapeutic drug level monitoring: Secondary | ICD-10-CM | POA: Diagnosis not present

## 2017-05-11 DIAGNOSIS — R0600 Dyspnea, unspecified: Secondary | ICD-10-CM | POA: Diagnosis not present

## 2017-05-11 DIAGNOSIS — D631 Anemia in chronic kidney disease: Secondary | ICD-10-CM | POA: Diagnosis not present

## 2017-05-11 DIAGNOSIS — N183 Chronic kidney disease, stage 3 (moderate): Secondary | ICD-10-CM | POA: Diagnosis not present

## 2017-05-11 DIAGNOSIS — N179 Acute kidney failure, unspecified: Secondary | ICD-10-CM | POA: Diagnosis not present

## 2017-05-11 DIAGNOSIS — E669 Obesity, unspecified: Secondary | ICD-10-CM | POA: Diagnosis not present

## 2017-05-11 DIAGNOSIS — E1122 Type 2 diabetes mellitus with diabetic chronic kidney disease: Secondary | ICD-10-CM | POA: Diagnosis not present

## 2017-05-11 DIAGNOSIS — I129 Hypertensive chronic kidney disease with stage 1 through stage 4 chronic kidney disease, or unspecified chronic kidney disease: Secondary | ICD-10-CM | POA: Diagnosis not present

## 2017-05-11 DIAGNOSIS — E875 Hyperkalemia: Secondary | ICD-10-CM | POA: Diagnosis not present

## 2017-05-19 DIAGNOSIS — L218 Other seborrheic dermatitis: Secondary | ICD-10-CM | POA: Diagnosis not present

## 2017-06-14 DIAGNOSIS — M5136 Other intervertebral disc degeneration, lumbar region: Secondary | ICD-10-CM | POA: Diagnosis not present

## 2017-06-14 DIAGNOSIS — M545 Low back pain: Secondary | ICD-10-CM | POA: Diagnosis not present

## 2017-06-14 DIAGNOSIS — Z5181 Encounter for therapeutic drug level monitoring: Secondary | ICD-10-CM | POA: Diagnosis not present

## 2017-06-14 DIAGNOSIS — M259 Joint disorder, unspecified: Secondary | ICD-10-CM | POA: Diagnosis not present

## 2017-06-28 DIAGNOSIS — R0602 Shortness of breath: Secondary | ICD-10-CM | POA: Diagnosis not present

## 2017-06-28 DIAGNOSIS — I480 Paroxysmal atrial fibrillation: Secondary | ICD-10-CM | POA: Diagnosis not present

## 2017-07-05 DIAGNOSIS — M259 Joint disorder, unspecified: Secondary | ICD-10-CM | POA: Diagnosis not present

## 2017-07-05 DIAGNOSIS — M5136 Other intervertebral disc degeneration, lumbar region: Secondary | ICD-10-CM | POA: Diagnosis not present

## 2017-07-05 DIAGNOSIS — M545 Low back pain: Secondary | ICD-10-CM | POA: Diagnosis not present

## 2017-07-05 DIAGNOSIS — Z5181 Encounter for therapeutic drug level monitoring: Secondary | ICD-10-CM | POA: Diagnosis not present

## 2017-07-19 DIAGNOSIS — E1165 Type 2 diabetes mellitus with hyperglycemia: Secondary | ICD-10-CM | POA: Diagnosis not present

## 2017-07-19 DIAGNOSIS — M545 Low back pain: Secondary | ICD-10-CM | POA: Diagnosis not present

## 2017-07-19 DIAGNOSIS — I10 Essential (primary) hypertension: Secondary | ICD-10-CM | POA: Diagnosis not present

## 2017-07-19 DIAGNOSIS — E1121 Type 2 diabetes mellitus with diabetic nephropathy: Secondary | ICD-10-CM | POA: Diagnosis not present

## 2017-07-19 DIAGNOSIS — E7800 Pure hypercholesterolemia, unspecified: Secondary | ICD-10-CM | POA: Diagnosis not present

## 2017-07-19 DIAGNOSIS — Z794 Long term (current) use of insulin: Secondary | ICD-10-CM | POA: Diagnosis not present

## 2017-07-19 DIAGNOSIS — I8310 Varicose veins of unspecified lower extremity with inflammation: Secondary | ICD-10-CM | POA: Diagnosis not present

## 2017-07-19 DIAGNOSIS — M179 Osteoarthritis of knee, unspecified: Secondary | ICD-10-CM | POA: Diagnosis not present

## 2017-07-19 DIAGNOSIS — J449 Chronic obstructive pulmonary disease, unspecified: Secondary | ICD-10-CM | POA: Diagnosis not present

## 2017-07-21 DIAGNOSIS — Z961 Presence of intraocular lens: Secondary | ICD-10-CM | POA: Diagnosis not present

## 2017-07-21 DIAGNOSIS — H401131 Primary open-angle glaucoma, bilateral, mild stage: Secondary | ICD-10-CM | POA: Diagnosis not present

## 2017-07-21 DIAGNOSIS — E119 Type 2 diabetes mellitus without complications: Secondary | ICD-10-CM | POA: Diagnosis not present

## 2017-07-21 DIAGNOSIS — H401211 Low-tension glaucoma, right eye, mild stage: Secondary | ICD-10-CM | POA: Diagnosis not present

## 2017-07-24 DIAGNOSIS — M542 Cervicalgia: Secondary | ICD-10-CM | POA: Diagnosis not present

## 2017-08-01 DIAGNOSIS — M545 Low back pain: Secondary | ICD-10-CM | POA: Diagnosis not present

## 2017-08-01 DIAGNOSIS — M5136 Other intervertebral disc degeneration, lumbar region: Secondary | ICD-10-CM | POA: Diagnosis not present

## 2017-08-01 DIAGNOSIS — Z5181 Encounter for therapeutic drug level monitoring: Secondary | ICD-10-CM | POA: Diagnosis not present

## 2017-08-01 DIAGNOSIS — M259 Joint disorder, unspecified: Secondary | ICD-10-CM | POA: Diagnosis not present

## 2017-08-02 ENCOUNTER — Other Ambulatory Visit: Payer: Self-pay

## 2017-08-02 ENCOUNTER — Ambulatory Visit: Payer: PPO | Attending: Family Medicine

## 2017-08-02 DIAGNOSIS — M62830 Muscle spasm of back: Secondary | ICD-10-CM | POA: Diagnosis present

## 2017-08-02 DIAGNOSIS — R262 Difficulty in walking, not elsewhere classified: Secondary | ICD-10-CM

## 2017-08-02 DIAGNOSIS — M6281 Muscle weakness (generalized): Secondary | ICD-10-CM | POA: Diagnosis present

## 2017-08-02 DIAGNOSIS — M25652 Stiffness of left hip, not elsewhere classified: Secondary | ICD-10-CM | POA: Diagnosis present

## 2017-08-02 DIAGNOSIS — M25651 Stiffness of right hip, not elsewhere classified: Secondary | ICD-10-CM | POA: Insufficient documentation

## 2017-08-02 DIAGNOSIS — M545 Low back pain: Secondary | ICD-10-CM | POA: Diagnosis present

## 2017-08-02 DIAGNOSIS — G8929 Other chronic pain: Secondary | ICD-10-CM | POA: Diagnosis present

## 2017-08-02 NOTE — Therapy (Signed)
[place], [place], [address] Phone: [phone]   Fax:  [phone]  Physical Therapy Evaluation  Patient Details  Name: Stephanie Woodward MRN: [id number] Date of Birth: Apr 27, 1940 Referring Provider: Donnie Coffin, MD   Encounter Date: 08/02/2017  PT End of Session - 08/02/17 0930    Visit Number  1    Number of Visits  12    Date for PT Re-Evaluation  09/09/17    PT Start Time  0845    PT Stop Time  0930    PT Time Calculation (min)  45 min    Activity Tolerance  Patient tolerated treatment well;Patient limited by pain    Behavior During Therapy  [facility] for tasks assessed/performed       Past Medical History:  Diagnosis Date  . Arthritis   . Asthma   . CHF (congestive heart failure) ([place])   . CKD (chronic kidney disease), stage III ([place])   . COPD (chronic obstructive pulmonary disease) ([place])   . Diabetes mellitus    INSULIN DEPENDENT  . Gout   . Hyperlipemia   . Hypertension   . Psoriasis   . Renal disorder    congenital  . Single kidney     Past Surgical History:  Procedure Laterality Date  . ABDOMINAL HYSTERECTOMY    . CHOLECYSTECTOMY    . LEFT AND RIGHT HEART CATHETERIZATION WITH CORONARY ANGIOGRAM N/A 11/29/2013   Procedure: LEFT AND RIGHT HEART CATHETERIZATION WITH CORONARY ANGIOGRAM;  Surgeon: Jacolyn Reedy, MD;  Location: [facility] CATH LAB;  Service: Cardiovascular;  Laterality: N/A;    There were no vitals filed for this visit.   Subjective Assessment - 08/02/17 0853    Subjective  She reports more pain in past year. She has been using walker for 5 years due to back pain.    She has trouble with walking and standing.      She does no exercises in past month. Lye on back and rotated back/hips , SLR.     Patient is accompained by:  Family member    Limitations  Standing;Walking    How long can you stand comfortably?  As needed no problem    How long can you walk comfortably?  10  minutes max with support    Diagnostic tests  150-200 feet    Patient Stated Goals  Decreased pain.     Currently in Pain?  Yes    Pain Score  7  when on feet , no pain sitting    Pain Location  Back    Pain Orientation  Right;Left;Lower more central    Pain Descriptors / Indicators  Sharp;Nagging    Pain Type  Chronic pain    Pain Onset  More than a month ago    Pain Frequency  Constant    Aggravating Factors   stand and walking    Pain Relieving Factors  Sit, meds         [facility] PT Assessment - 08/02/17 0001      Assessment   Medical Diagnosis  LBP    Referring Provider  Donnie Coffin, MD    Onset Date/Surgical Date  -- > 6-7 years ago , worse in last year.     Next MD Visit  As needed    Prior Therapy  PT in past x 2 last 5 years ago helped for 6 months.       Precautions   Precautions  None  Restrictions   Weight Bearing Restrictions  No      Balance Screen   Has the patient fallen in the past 6 months  No      Prior Function   Level of Independence  Requires assistive device for independence;Needs assistance with homemaking;Needs assistance with ADLs      Cognition   Overall Cognitive Status  Within Functional Limits for tasks assessed      Observation/Other Assessments   Focus on Therapeutic Outcomes (FOTO)   63% limited      Posture/Postural Control   Posture Comments  flexed knee and hips, trunk 20 degrees flexed      ROM / Strength   AROM / PROM / Strength  AROM;Strength;PROM      AROM   AROM Assessment Site  Lumbar    Lumbar Flexion  50    Lumbar Extension  0    Lumbar - Right Side Bend  5    Lumbar - Left Side Bend  0      PROM   Overall PROM Comments  Hip rotation IR 15-20 degrees ER 20- bilaterally,      Flexibility   Soft Tissue Assessment /Muscle Length  yes      Ambulation/Gait   Gait Comments  Using rolling walker. ( used for 5 years)   uses walker due to back pain.                   Objective measurements completed on  examination: See above findings.              PT Education - 08/02/17 0929    Education Details  POC , need to stretch    Person(s) Educated  Patient;Child(ren)    Methods  Explanation    Comprehension  Verbalized understanding       PT Short Term Goals - 08/02/17 0858      PT SHORT TERM GOAL #1   Title  she will be independent with initial HEP    Time  3    Period  Weeks    Status  New      PT SHORT TERM GOAL #2   Title  She will report pain decr 25% in lower back when on feet.     Time  3    Period  Weeks    Status  Revised        PT Long Term Goals - 08/02/17 3016      PT LONG TERM GOAL #1   Title  She and her family will be independent with all HEP issued    Time  6    Period  Weeks    Status  New      PT LONG TERM GOAL #2   Title  She will report pain decr 50% or more with standing and walkin in home.     Time  6    Period  Weeks    Status  New      PT LONG TERM GOAL #3   Title  She will be able to walk 300 feet or more to improve community access    Time  6    Period  Weeks    Status  New      PT LONG TERM GOAL #4   Title  She will report 30% greater ease with dressing.     Time  6    Period  Weeks    Status  New  Plan - 08/02/17 0930    Clinical Impression Statement  Ms Garlitz preesents with chronic pain with activity on feet. She has had pain for >6-7 years.  She has significant stiffness in spne and LE with decr shoulder reach overhead.  All ROM painful.   Poor abdominals SPoke with daughter  and expressed need to assist at home with exercise as able/  She should improve with PT but will still have pain and be limitied with mobility    Clinical Presentation  Stable    Clinical Decision Making  Low    Rehab Potential  Good    Clinical Impairments Affecting Rehab Potential  chronicity of pain nad stiffness    PT Frequency  2x / week    PT Duration  6 weeks    PT Treatment/Interventions  Electrical Stimulation;Moist  Heat;Gait training;Therapeutic exercise;Therapeutic activities;Patient/family education;Manual techniques;Dry needling;Taping;Passive range of motion    PT Next Visit Plan  nitiate HEP with stretching and PPT  , MHP if needed   distance walk    Consulted and Agree with Plan of Care  Patient;Family member/caregiver    Family Member Consulted  daughter       Patient will benefit from skilled therapeutic intervention in order to improve the following deficits and impairments:  Pain, Postural dysfunction, Increased muscle spasms, Decreased activity tolerance, Decreased endurance, Decreased range of motion, Decreased strength, Difficulty walking, Impaired UE functional use  Visit Diagnosis: Chronic bilateral low back pain without sciatica - Plan: PT plan of care cert/re-cert  Muscle spasm of back - Plan: PT plan of care cert/re-cert  Stiffness of right hip, not elsewhere classified - Plan: PT plan of care cert/re-cert  Stiffness of left hip, not elsewhere classified - Plan: PT plan of care cert/re-cert  Muscle weakness (generalized) - Plan: PT plan of care cert/re-cert  Difficulty in walking, not elsewhere classified - Plan: PT plan of care cert/re-cert     Problem List Patient Active Problem List   Diagnosis Date Noted  . Atrial fibrillation with RVR ([place]) 04/08/2016  . Chest pain 04/04/2016  . Acute on chronic renal failure ([place]) 03/09/2016  . Diabetes mellitus with complication ([place]) 00/37/0488  . Foot pain, bilateral 03/09/2016  . Renal disorder   . AKI (acute kidney injury) ([place])   . Bradycardia   . CHF (congestive heart failure) ([place]) 02/06/2014  . Cellulitis of right lower extremity 02/06/2014  . Essential hypertension 02/06/2014  . Hyperlipidemia 02/06/2014  . Diabetes mellitus type 2, controlled ([place]) 02/06/2014  . Cellulitis 02/06/2014  . OSA (obstructive sleep apnea) 01/22/2014  . Hypoxemia 12/07/2013  . Type 2 diabetes mellitus, controlled, with renal complications  ([place]) 89/16/9450  . Kidney disease, chronic, stage III (GFR 30-59 ml/min) ([place]) 12/07/2013  . Pulmonary hypertension ([place]) 12/07/2013  . Chronic systolic congestive heart failure ([place])   . Acute on chronic diastolic CHF (congestive heart failure), NYHA class 1 ([place]) 12/06/2013  . CHF exacerbation ([place]) 12/05/2013  . SOB (shortness of breath) 12/05/2013  . Gout 11/11/2009  . Obesity 11/11/2009  . Hypertensive heart disease     Darrel Hoover  PT 08/02/2017, 9:37 AM  [facility] [address] [place], [place], [address] Phone: [phone]   Fax:  [phone]  Name: Stephanie Woodward MRN: [id number] Date of Birth: Oct 09, 1940

## 2017-08-04 ENCOUNTER — Ambulatory Visit: Payer: PPO

## 2017-08-04 DIAGNOSIS — M62830 Muscle spasm of back: Secondary | ICD-10-CM

## 2017-08-04 DIAGNOSIS — M25651 Stiffness of right hip, not elsewhere classified: Secondary | ICD-10-CM

## 2017-08-04 DIAGNOSIS — M545 Low back pain: Secondary | ICD-10-CM | POA: Diagnosis not present

## 2017-08-04 DIAGNOSIS — R262 Difficulty in walking, not elsewhere classified: Secondary | ICD-10-CM

## 2017-08-04 DIAGNOSIS — G8929 Other chronic pain: Secondary | ICD-10-CM

## 2017-08-04 DIAGNOSIS — M25652 Stiffness of left hip, not elsewhere classified: Secondary | ICD-10-CM

## 2017-08-04 DIAGNOSIS — M6281 Muscle weakness (generalized): Secondary | ICD-10-CM

## 2017-08-04 NOTE — Therapy (Signed)
[place], [place], [address] Phone: [phone]   Fax:  [phone]  Physical Therapy Treatment  Patient Details  Name: Stephanie Woodward MRN: [id number] Date of Birth: [birth year] Referring Provider: Donnie Coffin, MD   Encounter Date: 08/04/2017  PT End of Session - 08/04/17 0717    Visit Number  2    Number of Visits  12    Date for PT Re-Evaluation  09/09/17    PT Start Time  0705    PT Stop Time  0750    PT Time Calculation (min)  45 min    Activity Tolerance  Patient tolerated treatment well;Patient limited by pain    Behavior During Therapy  [facility] for tasks assessed/performed       Past Medical History:  Diagnosis Date  . Arthritis   . Asthma   . CHF (congestive heart failure) ([place])   . CKD (chronic kidney disease), stage III ([place])   . COPD (chronic obstructive pulmonary disease) ([place])   . Diabetes mellitus    INSULIN DEPENDENT  . Gout   . Hyperlipemia   . Hypertension   . Psoriasis   . Renal disorder    congenital  . Single kidney     Past Surgical History:  Procedure Laterality Date  . ABDOMINAL HYSTERECTOMY    . CHOLECYSTECTOMY    . LEFT AND RIGHT HEART CATHETERIZATION WITH CORONARY ANGIOGRAM N/A 11/29/2013   Procedure: LEFT AND RIGHT HEART CATHETERIZATION WITH CORONARY ANGIOGRAM;  Surgeon: Jacolyn Reedy, MD;  Location: [facility] CATH LAB;  Service: Cardiovascular;  Laterality: N/A;    There were no vitals filed for this visit.  Subjective Assessment - 08/04/17 0715    Subjective  Knee sore and unstable but doing ok today    Patient is accompained by:  Family member    Currently in Pain?  Yes    Pain Score  6  knee pain with weight 5-6/10    Pain Location  Back    Pain Orientation  Left;Right                       [facility] Adult PT Treatment/Exercise - 08/04/17 0001      Therapeutic Activites    Therapeutic Activities  Other Therapeutic Activities    Other  Therapeutic Activities  WORKED ON GETTING IN AND OUT OF BED WITH GOING TO SIDE.       Exercises   Exercises  Lumbar      Lumbar Exercises: Aerobic   Nustep  LE L3 7 min      Lumbar Exercises: Seated   Long Arc Quad on Chair  Right;Left;10 reps    LAQ on Chair Limitations  10 sec hold       Lumbar Exercises: Supine   Other Supine Lumbar Exercises  sEE hep ALL X 10-12 RPES.        Manual Therapy   Manual Therapy  Manual Traction;Passive ROM;Soft tissue mobilization;Joint mobilization    Soft tissue mobilization  ANTERIOR HIP AND QUADS WITH HANDS AND BALL AND ROLLER, then hip ext stretch and short bridge and standing glut squeezes to end    Manual Traction  each LE x 75 reps GR 3                PT Short Term Goals - 08/02/17 1749      PT SHORT TERM GOAL #1   Title  she will be independent with initial HEP  Time  3    Period  Weeks    Status  New      PT SHORT TERM GOAL #2   Title  She will report pain decr 25% in lower back when on feet.     Time  3    Period  Weeks    Status  Revised        PT Long Term Goals - 08/02/17 7408      PT LONG TERM GOAL #1   Title  She and her family will be independent with all HEP issued    Time  6    Period  Weeks    Status  New      PT LONG TERM GOAL #2   Title  She will report pain decr 50% or more with standing and walkin in home.     Time  6    Period  Weeks    Status  New      PT LONG TERM GOAL #3   Title  She will be able to walk 300 feet or more to improve community access    Time  6    Period  Weeks    Status  New      PT LONG TERM GOAL #4   Title  She will report 30% greater ease with dressing.     Time  6    Period  Weeks    Status  New            Plan - 08/04/17 0751    Clinical Impression Statement  Limiyed by pain Ms Riccobono reported feeling some better post session . Continue exer and manual , modalities if needed    PT Treatment/Interventions  Electrical Stimulation;Moist Heat;Gait  training;Therapeutic exercise;Therapeutic activities;Patient/family education;Manual techniques;Dry needling;Taping;Passive range of motion    PT Next Visit Plan  reviewHEP with stretching and PPT  , MHP if needed   distance walk    PT Home Exercise Plan  LAQ , Bridge, hip abduction hip flexion    Consulted and Agree with Plan of Care  Patient    Family Member Consulted  daughter       Patient will benefit from skilled therapeutic intervention in order to improve the following deficits and impairments:  Pain, Postural dysfunction, Increased muscle spasms, Decreased activity tolerance, Decreased endurance, Decreased range of motion, Decreased strength, Difficulty walking, Impaired UE functional use  Visit Diagnosis: Chronic bilateral low back pain without sciatica  Muscle spasm of back  Stiffness of right hip, not elsewhere classified  Stiffness of left hip, not elsewhere classified  Muscle weakness (generalized)  Difficulty in walking, not elsewhere classified     Problem List Patient Active Problem List   Diagnosis Date Noted  . Atrial fibrillation with RVR ([place]) 04/08/2016  . Chest pain 04/04/2016  . Acute on chronic renal failure ([place]) 03/09/2016  . Diabetes mellitus with complication ([place]) 14/48/1856  . Foot pain, bilateral 03/09/2016  . Renal disorder   . AKI (acute kidney injury) ([place])   . Bradycardia   . CHF (congestive heart failure) ([place]) 02/06/2014  . Cellulitis of right lower extremity 02/06/2014  . Essential hypertension 02/06/2014  . Hyperlipidemia 02/06/2014  . Diabetes mellitus type 2, controlled ([place]) 02/06/2014  . Cellulitis 02/06/2014  . OSA (obstructive sleep apnea) 01/22/2014  . Hypoxemia 12/07/2013  . Type 2 diabetes mellitus, controlled, with renal complications ([place]) 31/49/7026  . Kidney disease, chronic, stage III (GFR 30-59 ml/min) ([place]) 12/07/2013  .  Pulmonary hypertension ([place]) 12/07/2013  . Chronic systolic congestive heart failure ([place])    . Acute on chronic diastolic CHF (congestive heart failure), NYHA class 1 ([place]) 12/06/2013  . CHF exacerbation ([place]) 12/05/2013  . SOB (shortness of breath) 12/05/2013  . Gout 11/11/2009  . Obesity 11/11/2009  . Hypertensive heart disease     Darrel Hoover PT 08/04/2017, 7:53 AM  [facility] [address] [place], [place], [address] Phone: [phone]   Fax:  [phone]  Name: Allayah Raineri MRN: [id number] Date of Birth: December 17, 1940

## 2017-08-04 NOTE — Patient Instructions (Signed)
Hip Flexion - Supine    Lying on back, knees bent, feet on floor, bend hips, bringing knees toward trunk.  DO ONE LEG AT A TIME NOT BOTH LIKE PICTURE Repeat ___ times. Do ___ times per day.  Copyright  VHI. All rights reserved.  HIP: Abduction - Supine    Move leg away from body. Keep knee and toes straight. ___ reps per set, ___ sets per day, ___ days per week Place board under leg to decrease friction. Place skate under leg.  Copyright  VHI. All rights reserved.  Bridge [employer] small of back into mat, maintain pelvic tilt, roll up one vertebrae at a time. Focus on engaging posterior hip muscles. Hold for ____ breaths. Repeat ____ times.  Copyright  VHI. All rights reserved.  KNEE: Extension, Long Arc Quads - Sitting    Raise leg until knee is straight. ___ reps per set, ___ sets per day, ___ days per week HOLD  10 SECONDS  Copyright  VHI. All rights reserved.  ALL EXERCISE AT LEAST 1X/DAY 2 WOULD BE BETTER 10-20 REPS RIGHT AND LEFT LEG

## 2017-08-09 ENCOUNTER — Ambulatory Visit: Payer: PPO

## 2017-08-09 DIAGNOSIS — M545 Low back pain: Secondary | ICD-10-CM | POA: Diagnosis not present

## 2017-08-09 DIAGNOSIS — G8929 Other chronic pain: Secondary | ICD-10-CM

## 2017-08-09 DIAGNOSIS — M25651 Stiffness of right hip, not elsewhere classified: Secondary | ICD-10-CM

## 2017-08-09 DIAGNOSIS — R262 Difficulty in walking, not elsewhere classified: Secondary | ICD-10-CM

## 2017-08-09 DIAGNOSIS — M62830 Muscle spasm of back: Secondary | ICD-10-CM

## 2017-08-09 DIAGNOSIS — M6281 Muscle weakness (generalized): Secondary | ICD-10-CM

## 2017-08-09 DIAGNOSIS — M25652 Stiffness of left hip, not elsewhere classified: Secondary | ICD-10-CM

## 2017-08-09 NOTE — Therapy (Signed)
[place], [place], [address] Phone: [phone]   Fax:  [phone]  Physical Therapy Treatment  Patient Details  Name: Stephanie Woodward MRN: [id number] Date of Birth: 09/13/1940 Referring Provider: Donnie Coffin, MD   Encounter Date: 08/09/2017  PT End of Session - 08/09/17 1019    Visit Number  3    Number of Visits  12    Date for PT Re-Evaluation  09/09/17    PT Start Time  1018    PT Stop Time  1110    PT Time Calculation (min)  52 min    Activity Tolerance  Patient tolerated treatment well;Patient limited by pain    Behavior During Therapy  [facility] for tasks assessed/performed       Past Medical History:  Diagnosis Date  . Arthritis   . Asthma   . CHF (congestive heart failure) ([place])   . CKD (chronic kidney disease), stage III ([place])   . COPD (chronic obstructive pulmonary disease) ([place])   . Diabetes mellitus    INSULIN DEPENDENT  . Gout   . Hyperlipemia   . Hypertension   . Psoriasis   . Renal disorder    congenital  . Single kidney     Past Surgical History:  Procedure Laterality Date  . ABDOMINAL HYSTERECTOMY    . CHOLECYSTECTOMY    . LEFT AND RIGHT HEART CATHETERIZATION WITH CORONARY ANGIOGRAM N/A 11/29/2013   Procedure: LEFT AND RIGHT HEART CATHETERIZATION WITH CORONARY ANGIOGRAM;  Surgeon: Jacolyn Reedy, MD;  Location: [facility] CATH LAB;  Service: Cardiovascular;  Laterality: N/A;    There were no vitals filed for this visit.  Subjective Assessment - 08/09/17 1022    Subjective  Feel a little better . no incr pain    Currently in Pain?  Yes    Pain Score  6     Pain Location  Back    Pain Orientation  Right;Left;Lower    Pain Descriptors / Indicators  Nagging    Pain Type  Chronic pain    Pain Onset  More than a month ago    Pain Frequency  Constant                       [facility] Adult PT Treatment/Exercise - 08/09/17 0001      Therapeutic Activites    Other  Therapeutic Activities  rolling in bed cued for reach with arm and pull top leg over.         Lumbar Exercises: Seated   Long Arc Quad on Chair  Right;Left;15 reps;Weights    LAQ on Chair Weights (lbs)  2    Sit to Stand  5 reps      Lumbar Exercises: Supine   Bent Knee Raise  15 reps    Bridge  15 reps    Bridge with [employer]  10 reps    Bridge with clamshell  10 reps    Other Supine Lumbar Exercises  LTR legs on ball x  x 15 RT/LT  bridge x 10       Knee/Hip Exercises: Stretches   Other Knee/Hip Stretches  clams x 15      Modalities   Modalities  Moist Heat      Moist Heat Therapy   Number Minutes Moist Heat  12 Minutes    Moist Heat Location  Lumbar Spine sitting      Manual Therapy   Manual Therapy  Passive  ROM    Soft tissue mobilization  PA glides to each hip ,     Passive ROM  hip ROM all planes    Manual Traction  each LE x 100 reps GR 3                PT Short Term Goals - 08/09/17 1101      PT SHORT TERM GOAL #1   Title  she will be independent with initial HEP    Status  On-going      PT SHORT TERM GOAL #2   Title  She will report pain decr 25% in lower back when on feet.     Status  On-going        PT Long Term Goals - 08/02/17 7858      PT LONG TERM GOAL #1   Title  She and her family will be independent with all HEP issued    Time  6    Period  Weeks    Status  New      PT LONG TERM GOAL #2   Title  She will report pain decr 50% or more with standing and walkin in home.     Time  6    Period  Weeks    Status  New      PT LONG TERM GOAL #3   Title  She will be able to walk 300 feet or more to improve community access    Time  6    Period  Weeks    Status  New      PT LONG TERM GOAL #4   Title  She will report 30% greater ease with dressing.     Time  6    Period  Weeks    Status  New            Plan - 08/09/17 1019    Clinical Impression Statement  she reports less pain post but pain with stretching She was able  to roll better with cues to reach and pull legs ove. She would benefit from this at home and she and daughter were told this.     PT Treatment/Interventions  Software engineer;Therapeutic exercise;Therapeutic activities;Patient/family education;Manual techniques;Dry needling;Taping;Passive range of motion    PT Next Visit Plan  reviewHEP with stretching and PPT  , MHP if needed   distance walk    PT Home Exercise Plan  LAQ , Bridge, hip abduction hip flexion, Rolling in bed    Consulted and Agree with Plan of Care  Patient    Family Member Consulted  daughter       Patient will benefit from skilled therapeutic intervention in order to improve the following deficits and impairments:  Pain, Postural dysfunction, Increased muscle spasms, Decreased activity tolerance, Decreased endurance, Decreased range of motion, Decreased strength, Difficulty walking, Impaired UE functional use  Visit Diagnosis: Chronic bilateral low back pain without sciatica  Muscle spasm of back  Stiffness of right hip, not elsewhere classified  Stiffness of left hip, not elsewhere classified  Muscle weakness (generalized)  Difficulty in walking, not elsewhere classified     Problem List Patient Active Problem List   Diagnosis Date Noted  . Atrial fibrillation with RVR ([place]) 04/08/2016  . Chest pain 04/04/2016  . Acute on chronic renal failure ([place]) 03/09/2016  . Diabetes mellitus with complication ([place]) 85/03/7739  . Foot pain, bilateral 03/09/2016  . Renal disorder   . AKI (acute kidney injury) (  [facility])   . Bradycardia   . CHF (congestive heart failure) ([place]) 02/06/2014  . Cellulitis of right lower extremity 02/06/2014  . Essential hypertension 02/06/2014  . Hyperlipidemia 02/06/2014  . Diabetes mellitus type 2, controlled ([place]) 02/06/2014  . Cellulitis 02/06/2014  . OSA (obstructive sleep apnea) 01/22/2014  . Hypoxemia 12/07/2013  . Type 2 diabetes mellitus, controlled,  with renal complications ([place]) 03/24/4386  . Kidney disease, chronic, stage III (GFR 30-59 ml/min) ([place]) 12/07/2013  . Pulmonary hypertension ([employer]) 12/07/2013  . Chronic systolic congestive heart failure ([place])   . Acute on chronic diastolic CHF (congestive heart failure), NYHA class 1 ([place]) 12/06/2013  . CHF exacerbation ([place]) 12/05/2013  . SOB (shortness of breath) 12/05/2013  . Gout 11/11/2009  . Obesity 11/11/2009  . Hypertensive heart disease     Darrel Hoover  PT 08/09/2017, 11:02 AM  [facility] [address] [place], [place], [address] Phone: [phone]   Fax:  [phone]  Name: Stephanie Woodward MRN: [id number] Date of Birth: September 02, 1940

## 2017-08-11 ENCOUNTER — Encounter: Payer: Self-pay | Admitting: Physical Therapy

## 2017-08-11 ENCOUNTER — Ambulatory Visit: Payer: PPO | Admitting: Physical Therapy

## 2017-08-11 DIAGNOSIS — R262 Difficulty in walking, not elsewhere classified: Secondary | ICD-10-CM

## 2017-08-11 DIAGNOSIS — M545 Low back pain: Secondary | ICD-10-CM | POA: Diagnosis not present

## 2017-08-11 DIAGNOSIS — M6281 Muscle weakness (generalized): Secondary | ICD-10-CM

## 2017-08-11 DIAGNOSIS — M25652 Stiffness of left hip, not elsewhere classified: Secondary | ICD-10-CM

## 2017-08-11 DIAGNOSIS — G8929 Other chronic pain: Secondary | ICD-10-CM

## 2017-08-11 DIAGNOSIS — M62830 Muscle spasm of back: Secondary | ICD-10-CM

## 2017-08-11 DIAGNOSIS — M25651 Stiffness of right hip, not elsewhere classified: Secondary | ICD-10-CM

## 2017-08-11 NOTE — Therapy (Signed)
Stephanie Woodward, [place], [address] Phone: [phone]   Fax:  [phone]  Physical Therapy Treatment  Patient Details  Name: Stephanie Woodward MRN: [id number] Date of Birth: 1940-03-08 Referring Provider: Donnie Coffin, MD   Encounter Date: 08/11/2017  PT End of Session - 08/11/17 1228    Visit Number  4    Number of Visits  12    Date for PT Re-Evaluation  09/09/17    PT Start Time  1155 moist heat not included in billing    PT Stop Time  1227    PT Time Calculation (min)  32 min    Activity Tolerance  Patient tolerated treatment well    Behavior During Therapy  [facility] for tasks assessed/performed       Past Medical History:  Diagnosis Date  . Arthritis   . Asthma   . CHF (congestive heart failure) ([place])   . CKD (chronic kidney disease), stage III ([place])   . COPD (chronic obstructive pulmonary disease) ([place])   . Diabetes mellitus    INSULIN DEPENDENT  . Gout   . Hyperlipemia   . Hypertension   . Psoriasis   . Renal disorder    congenital  . Single kidney     Past Surgical History:  Procedure Laterality Date  . ABDOMINAL HYSTERECTOMY    . CHOLECYSTECTOMY    . LEFT AND RIGHT HEART CATHETERIZATION WITH CORONARY ANGIOGRAM N/A 11/29/2013   Procedure: LEFT AND RIGHT HEART CATHETERIZATION WITH CORONARY ANGIOGRAM;  Surgeon: Jacolyn Reedy, MD;  Location: [facility] CATH LAB;  Service: Cardiovascular;  Laterality: N/A;    There were no vitals filed for this visit.  Subjective Assessment - 08/11/17 1149    Subjective  My back is better, the stretching helps the most. The heat has also been helping. My HEP is going good, its not too easier.     Patient Stated Goals  Decreased pain.     Currently in Pain?  Yes    Pain Score  5     Pain Location  Back    Pain Orientation  Left;Lower    Pain Descriptors / Indicators  Sharp    Pain Type  Chronic pain    Pain Radiating Towards  none     Pain Onset  More than a  month ago    Pain Frequency  Intermittent    Aggravating Factors   standing up     Pain Relieving Factors  sitting     Effect of Pain on Daily Activities  moderate                        [facility] Adult PT Treatment/Exercise - 08/11/17 0001      Lumbar Exercises: Stretches   Active Hamstring Stretch  Right;Left;2 reps;30 seconds      Lumbar Exercises: Seated   Long Arc Quad on Chair  Right;Left;Weights;10 reps    LAQ on Chair Weights (lbs)  3    Sit to Stand  10 reps    Other Seated Lumbar Exercises  seated QL stretch using stool 3 ways (htree rounds) x10 seconds each     Other Seated Lumbar Exercises  lumbar rotation stretch 5x10 seconds B       Lumbar Exercises: Supine   Clam  10 reps;Other (comment) red TB     Bent Knee Raise  15 reps;Other (comment) red TB     Bridge  15 reps  Straight Leg Raise  Other (comment);5 reps core set, eccentric lower       Modalities   Modalities  Moist Heat      Moist Heat Therapy   Number Minutes Moist Heat  10 Minutes not included in billing     Moist Heat Location  Lumbar Spine in sitting              PT Education - 08/11/17 1228    Education Details  exercise form during session     Person(s) Educated  Patient    Methods  Explanation    Comprehension  Verbalized understanding       PT Short Term Goals - 08/09/17 1101      PT SHORT TERM GOAL #1   Title  she will be independent with initial HEP    Status  On-going      PT SHORT TERM GOAL #2   Title  She will report pain decr 25% in lower back when on feet.     Status  On-going        PT Long Term Goals - 08/02/17 4166      PT LONG TERM GOAL #1   Title  She and her family will be independent with all HEP issued    Time  6    Period  Weeks    Status  New      PT LONG TERM GOAL #2   Title  She will report pain decr 50% or more with standing and walkin in home.     Time  6    Period  Weeks    Status  New      PT LONG TERM GOAL #3   Title  She will  be able to walk 300 feet or more to improve community access    Time  6    Period  Weeks    Status  New      PT LONG TERM GOAL #4   Title  She will report 30% greater ease with dressing.     Time  6    Period  Weeks    Status  New            Plan - 08/11/17 1229    Clinical Impression Statement  Began session with moist heat prior to stretches today (moist heat not included in billing), then proceeded with lumbar stretching and core strengthening as tolerated today. Patient appears to do very well with lumbar stretching routine and reports this is a major factor alleviating her pain. She appears to be limited by shortness of breath as much as by her back pain, however SpO2 on room air remains between 91-94% on room air today.     Rehab Potential  Good    Clinical Impairments Affecting Rehab Potential  chronicity of pain nad stiffness    PT Frequency  2x / week    PT Duration  6 weeks    PT Treatment/Interventions  Electrical Stimulation;Moist Heat;Gait training;Therapeutic exercise;Therapeutic activities;Patient/family education;Manual techniques;Dry needling;Taping;Passive range of motion    PT Next Visit Plan  reviewHEP with stretching and PPT  , MHP if needed   distance walk. Monitor SpO2. Did she get new personal O2 monitor?     PT Home Exercise Plan  LAQ , Bridge, hip abduction hip flexion, Rolling in bed    Consulted and Agree with Plan of Care  Patient    Family Member Consulted  grand daughter  Patient will benefit from skilled therapeutic intervention in order to improve the following deficits and impairments:  Pain, Postural dysfunction, Increased muscle spasms, Decreased activity tolerance, Decreased endurance, Decreased range of motion, Decreased strength, Difficulty walking, Impaired UE functional use  Visit Diagnosis: Chronic bilateral low back pain without sciatica  Muscle spasm of back  Stiffness of right hip, not elsewhere classified  Stiffness of left  hip, not elsewhere classified  Muscle weakness (generalized)  Difficulty in walking, not elsewhere classified     Problem List Patient Active Problem List   Diagnosis Date Noted  . Atrial fibrillation with RVR ([place]) 04/08/2016  . Chest pain 04/04/2016  . Acute on chronic renal failure ([place]) 03/09/2016  . Diabetes mellitus with complication ([place]) 02/58/5277  . Foot pain, bilateral 03/09/2016  . Renal disorder   . AKI (acute kidney injury) ([place])   . Bradycardia   . CHF (congestive heart failure) ([place]) 02/06/2014  . Cellulitis of right lower extremity 02/06/2014  . Essential hypertension 02/06/2014  . Hyperlipidemia 02/06/2014  . Diabetes mellitus type 2, controlled ([place]) 02/06/2014  . Cellulitis 02/06/2014  . OSA (obstructive sleep apnea) 01/22/2014  . Hypoxemia 12/07/2013  . Type 2 diabetes mellitus, controlled, with renal complications ([place]) 82/42/3536  . Kidney disease, chronic, stage III (GFR 30-59 ml/min) ([place]) 12/07/2013  . Pulmonary hypertension ([place]) 12/07/2013  . Chronic systolic congestive heart failure ([place])   . Acute on chronic diastolic CHF (congestive heart failure), NYHA class 1 ([place]) 12/06/2013  . CHF exacerbation ([place]) 12/05/2013  . SOB (shortness of breath) 12/05/2013  . Gout 11/11/2009  . Obesity 11/11/2009  . Hypertensive heart disease     Deniece Ree PT, DPT, CBIS  Supplemental Physical Therapist [place]   Pager [facility] [address] [place], [place], [address] Phone: [phone]   Fax:  [phone]  Name: Blimie Vaness MRN: [id number] Date of Birth: 1940/04/18

## 2017-08-12 ENCOUNTER — Encounter

## 2017-08-15 ENCOUNTER — Ambulatory Visit: Payer: PPO

## 2017-08-15 DIAGNOSIS — G8929 Other chronic pain: Secondary | ICD-10-CM

## 2017-08-15 DIAGNOSIS — M545 Low back pain: Principal | ICD-10-CM

## 2017-08-15 DIAGNOSIS — M25652 Stiffness of left hip, not elsewhere classified: Secondary | ICD-10-CM

## 2017-08-15 DIAGNOSIS — R262 Difficulty in walking, not elsewhere classified: Secondary | ICD-10-CM

## 2017-08-15 DIAGNOSIS — M62830 Muscle spasm of back: Secondary | ICD-10-CM

## 2017-08-15 DIAGNOSIS — M6281 Muscle weakness (generalized): Secondary | ICD-10-CM

## 2017-08-15 DIAGNOSIS — M25651 Stiffness of right hip, not elsewhere classified: Secondary | ICD-10-CM

## 2017-08-15 NOTE — Therapy (Signed)
[place], [place], [address] Phone: [phone]   Fax:  [phone]  Physical Therapy Treatment  Patient Details  Name: Stephanie Woodward MRN: [id number] Date of Birth: 1940/08/30 Referring Provider: Donnie Coffin, MD   Encounter Date: 08/15/2017  PT End of Session - 08/15/17 1215    Visit Number  5    Number of Visits  12    Date for PT Re-Evaluation  09/09/17    PT Start Time  1856 heat final 12 min . She was not able to lye due to breathing so only 30 min treatment    PT Stop Time  1230    PT Time Calculation (min)  45 min    Activity Tolerance  Patient tolerated treatment well    Behavior During Therapy  [facility] for tasks assessed/performed       Past Medical History:  Diagnosis Date  . Arthritis   . Asthma   . CHF (congestive heart failure) ([place])   . CKD (chronic kidney disease), stage III ([place])   . COPD (chronic obstructive pulmonary disease) ([place])   . Diabetes mellitus    INSULIN DEPENDENT  . Gout   . Hyperlipemia   . Hypertension   . Psoriasis   . Renal disorder    congenital  . Single kidney     Past Surgical History:  Procedure Laterality Date  . ABDOMINAL HYSTERECTOMY    . CHOLECYSTECTOMY    . LEFT AND RIGHT HEART CATHETERIZATION WITH CORONARY ANGIOGRAM N/A 11/29/2013   Procedure: LEFT AND RIGHT HEART CATHETERIZATION WITH CORONARY ANGIOGRAM;  Surgeon: Jacolyn Reedy, MD;  Location: [facility] CATH LAB;  Service: Cardiovascular;  Laterality: N/A;    There were no vitals filed for this visit.  Subjective Assessment - 08/15/17 1153    Subjective  She reports trouble breathing.    Not out of ordinary.  Pain less and can walk a little straighter.  Family noticed    Currently in Pain?  Yes    Pain Score  5     Pain Location  Back    Pain Orientation  Left    Pain Descriptors / Indicators  Sharp    Pain Type  Chronic pain    Pain Onset  More than a month ago    Pain Frequency  Intermittent     Aggravating Factors   standing    Pain Relieving Factors  sit                       [facility] Adult PT Treatment/Exercise - 08/15/17 0001      Lumbar Exercises: Seated   Long Arc Quad on Chair  Right;Left;20 reps    LAQ on Chair Weights (lbs)  3    Sit to Stand  10 reps    Other Seated Lumbar Exercises  seated QL stretch  3 ways (htree rounds) x10 seconds each     Other Seated Lumbar Exercises  lumbar rotation stretch 5 x10 seconds B , then ball squeeze hip adduction x 20 , then clam green band x 20 reps, then marching x 20 RT/LT, then hip rotation RT / LT x 20 active.       Moist Heat Therapy   Number Minutes Moist Heat  12 Minutes    Moist Heat Location  Lumbar Spine sitting               PT Short Term Goals - 08/15/17 1217  PT SHORT TERM GOAL #1   Title  she will be independent with initial HEP    Baseline  she reports doinh HEP as able with breathing    Status  Achieved      PT SHORT TERM GOAL #2   Title  She will report pain decr 25% in lower back when on feet.     Baseline  reports 25% decr pain thouhg pain varies    Status  Achieved        PT Long Term Goals - 08/02/17 0927      PT LONG TERM GOAL #1   Title  She and her family will be independent with all HEP issued    Time  6    Period  Weeks    Status  New      PT LONG TERM GOAL #2   Title  She will report pain decr 50% or more with standing and walkin in home.     Time  6    Period  Weeks    Status  New      PT LONG TERM GOAL #3   Title  She will be able to walk 300 feet or more to improve community access    Time  6    Period  Weeks    Status  New      PT LONG TERM GOAL #4   Title  She will report 30% greater ease with dressing.     Time  6    Period  Weeks    Status  New            Plan - 08/15/17 1216    Clinical Impression Statement  Breathing issues limited ability to lye so all session done sitting. No manual today. She felt better with breathing and  repots better mobility and less pain.     PT Treatment/Interventions  Software engineer;Therapeutic exercise;Therapeutic activities;Patient/family education;Manual techniques;Dry needling;Taping;Passive range of motion    PT Next Visit Plan  review HEP with stretching and PPT if able to lye down.  , MHP if needed   distance walk. Monitor SpO2. Did she get new personal O2 monitor?     PT Home Exercise Plan  LAQ , Bridge, hip abduction hip flexion, Rolling in bed    Consulted and Agree with Plan of Care  Patient       Patient will benefit from skilled therapeutic intervention in order to improve the following deficits and impairments:  Pain, Postural dysfunction, Increased muscle spasms, Decreased activity tolerance, Decreased endurance, Decreased range of motion, Decreased strength, Difficulty walking, Impaired UE functional use  Visit Diagnosis: Chronic bilateral low back pain without sciatica  Muscle spasm of back  Stiffness of right hip, not elsewhere classified  Stiffness of left hip, not elsewhere classified  Muscle weakness (generalized)  Difficulty in walking, not elsewhere classified     Problem List Patient Active Problem List   Diagnosis Date Noted  . Atrial fibrillation with RVR ([place]) 04/08/2016  . Chest pain 04/04/2016  . Acute on chronic renal failure ([place]) 03/09/2016  . Diabetes mellitus with complication ([place]) 31/49/7026  . Foot pain, bilateral 03/09/2016  . Renal disorder   . AKI (acute kidney injury) ([place])   . Bradycardia   . CHF (congestive heart failure) ([place]) 02/06/2014  . Cellulitis of right lower extremity 02/06/2014  . Essential hypertension 02/06/2014  . Hyperlipidemia 02/06/2014  . Diabetes mellitus type 2, controlled ([place]) 02/06/2014  . Cellulitis  02/06/2014  . OSA (obstructive sleep apnea) 01/22/2014  . Hypoxemia 12/07/2013  . Type 2 diabetes mellitus, controlled, with renal complications ([place]) 90/38/3338  . Kidney  disease, chronic, stage III (GFR 30-59 ml/min) ([place]) 12/07/2013  . Pulmonary hypertension ([place]) 12/07/2013  . Chronic systolic congestive heart failure ([place])   . Acute on chronic diastolic CHF (congestive heart failure), NYHA class 1 ([place]) 12/06/2013  . CHF exacerbation ([place]) 12/05/2013  . SOB (shortness of breath) 12/05/2013  . Gout 11/11/2009  . Obesity 11/11/2009  . Hypertensive heart disease     Darrel Hoover  PT 08/15/2017, 12:20 PM  [facility] [address] [place], [place], [address] Phone: [phone]   Fax:  [phone]  Name: Deidra Spease MRN: [id number] Date of Birth: 1940/09/09

## 2017-08-17 ENCOUNTER — Ambulatory Visit: Payer: PPO | Admitting: Physical Therapy

## 2017-08-17 ENCOUNTER — Encounter: Payer: Self-pay | Admitting: Physical Therapy

## 2017-08-17 VITALS — BP 153/80 | HR 90

## 2017-08-17 DIAGNOSIS — M545 Low back pain, unspecified: Secondary | ICD-10-CM

## 2017-08-17 DIAGNOSIS — M25651 Stiffness of right hip, not elsewhere classified: Secondary | ICD-10-CM

## 2017-08-17 DIAGNOSIS — G8929 Other chronic pain: Secondary | ICD-10-CM

## 2017-08-17 DIAGNOSIS — M6281 Muscle weakness (generalized): Secondary | ICD-10-CM

## 2017-08-17 DIAGNOSIS — M25652 Stiffness of left hip, not elsewhere classified: Secondary | ICD-10-CM

## 2017-08-17 DIAGNOSIS — M62830 Muscle spasm of back: Secondary | ICD-10-CM

## 2017-08-17 DIAGNOSIS — R262 Difficulty in walking, not elsewhere classified: Secondary | ICD-10-CM

## 2017-08-17 NOTE — Therapy (Signed)
[place], [place], [address] Phone: [phone]   Fax:  [phone]  Physical Therapy Treatment  Patient Details  Name: Stephanie Woodward MRN: [id number] Date of Birth: 1941/02/08 Referring Provider: Donnie Coffin, MD   Encounter Date: 08/17/2017  PT End of Session - 08/17/17 1400    Visit Number  6    Number of Visits  12    Date for PT Re-Evaluation  09/09/17    PT Start Time  0145    PT Stop Time  0245    PT Time Calculation (min)  60 min       Past Medical History:  Diagnosis Date  . Arthritis   . Asthma   . CHF (congestive heart failure) ([place])   . CKD (chronic kidney disease), stage III ([place])   . COPD (chronic obstructive pulmonary disease) ([place])   . Diabetes mellitus    INSULIN DEPENDENT  . Gout   . Hyperlipemia   . Hypertension   . Psoriasis   . Renal disorder    congenital  . Single kidney     Past Surgical History:  Procedure Laterality Date  . ABDOMINAL HYSTERECTOMY    . CHOLECYSTECTOMY    . LEFT AND RIGHT HEART CATHETERIZATION WITH CORONARY ANGIOGRAM N/A 11/29/2013   Procedure: LEFT AND RIGHT HEART CATHETERIZATION WITH CORONARY ANGIOGRAM;  Surgeon: Jacolyn Reedy, MD;  Location: [facility] CATH LAB;  Service: Cardiovascular;  Laterality: N/A;    Vitals:   08/17/17 1350  BP: (!) 153/80  Pulse: 90  SpO2: 93%    Subjective Assessment - 08/17/17 1351    Subjective  More Shortness of breath today but not as bad as last session.     Currently in Pain?  Yes    Pain Score  6     Pain Location  Back    Pain Orientation  Mid;Lower    Pain Descriptors / Indicators  Throbbing                       [facility] Adult PT Treatment/Exercise - 08/17/17 0001      Ambulation/Gait   Ambulation/Gait  Yes    Ambulation/Gait Assistance  6: Modified independent (Device/Increase time)    Ambulation Distance (Feet)  175 Feet    Assistive device  Rollator    Gait Comments  Flexed  posture, requires 2 standing rest breaks to lean forearms on rollator due to LBP increasing up to 8/10      Lumbar Exercises: Stretches   Lower Trunk Rotation  5 reps;10 seconds each way      Lumbar Exercises: Seated   Long Arc Quad on Chair  Right;Left;20 reps    Sit to Stand  10 reps    Other Seated Lumbar Exercises  seated QL stretch  3 ways (htree rounds) x10 seconds each x 3    Other Seated Lumbar Exercises  Seated Side bend stretch x 10 eac way with arm on ball       Lumbar Exercises: Supine   Glut Set  10 reps    Clam  20 reps;Other (comment) red TB , unilateral and bilateral    Bridge  15 reps               PT Short Term Goals - 08/15/17 1217      PT SHORT TERM GOAL #1   Title  she will be independent with initial HEP    Baseline  she reports  doinh HEP as able with breathing    Status  Achieved      PT SHORT TERM GOAL #2   Title  She will report pain decr 25% in lower back when on feet.     Baseline  reports 25% decr pain thouhg pain varies    Status  Achieved        PT Long Term Goals - 08/02/17 0927      PT LONG TERM GOAL #1   Title  She and her family will be independent with all HEP issued    Time  6    Period  Weeks    Status  New      PT LONG TERM GOAL #2   Title  She will report pain decr 50% or more with standing and walkin in home.     Time  6    Period  Weeks    Status  New      PT LONG TERM GOAL #3   Title  She will be able to walk 300 feet or more to improve community access    Time  6    Period  Weeks    Status  New      PT LONG TERM GOAL #4   Title  She will report 30% greater ease with dressing.     Time  6    Period  Weeks    Status  New            Plan - 08/17/17 1357    Clinical Impression Statement  HR 108 SP02 94% with ambulation in clinic, 175 FT. She required 2 standing rest breaks due to LBP where she rests one forearm on her rollator due to 8/10 LBP. Pt reports SOB better today. Able to tolerate supine  exercises on wedge. She reports bridge ROM improved. [place] post session. 5/10 low back pain prior to [place].     PT Next Visit Plan  review HEP with stretching and PPT if able to lye down.  , MHP if needed   distance walk. Monitor SpO2. Did she get new personal O2 monitor?     PT Home Exercise Plan  LAQ , Bridge, hip abduction hip flexion, Rolling in bed    Consulted and Agree with Plan of Care  Patient    Family Member Consulted  grand daughter        Patient will benefit from skilled therapeutic intervention in order to improve the following deficits and impairments:  Pain, Postural dysfunction, Decreased activity tolerance, Decreased endurance, Decreased range of motion, Decreased strength, Difficulty walking, Impaired UE functional use, Increased muscle spasms  Visit Diagnosis: Chronic bilateral low back pain without sciatica  Muscle spasm of back  Stiffness of left hip, not elsewhere classified  Stiffness of right hip, not elsewhere classified  Muscle weakness (generalized)  Difficulty in walking, not elsewhere classified     Problem List Patient Active Problem List   Diagnosis Date Noted  . Atrial fibrillation with RVR ([place]) 04/08/2016  . Chest pain 04/04/2016  . Acute on chronic renal failure ([place]) 03/09/2016  . Diabetes mellitus with complication ([place]) 38/25/0539  . Foot pain, bilateral 03/09/2016  . Renal disorder   . AKI (acute kidney injury) ([place])   . Bradycardia   . CHF (congestive heart failure) ([place]) 02/06/2014  . Cellulitis of right lower extremity 02/06/2014  . Essential hypertension 02/06/2014  . Hyperlipidemia 02/06/2014  . Diabetes mellitus type 2, controlled ([place]) 02/06/2014  .  Cellulitis 02/06/2014  . OSA (obstructive sleep apnea) 01/22/2014  . Hypoxemia 12/07/2013  . Type 2 diabetes mellitus, controlled, with renal complications ([place]) 04/22/3141  . Kidney disease, chronic, stage III (GFR 30-59 ml/min) ([place]) 12/07/2013  . Pulmonary hypertension ([place])  12/07/2013  . Chronic systolic congestive heart failure ([place])   . Acute on chronic diastolic CHF (congestive heart failure), NYHA class 1 ([place]) 12/06/2013  . CHF exacerbation ([place]) 12/05/2013  . SOB (shortness of breath) 12/05/2013  . Gout 11/11/2009  . Obesity 11/11/2009  . Hypertensive heart disease     Dorene Ar, [place] 08/17/2017, 3:23 PM  [facility] [address] [place], [place], [address] Phone: [phone]   Fax:  [phone]  Name: Stephanie Woodward MRN: [id number] Date of Birth: Apr 29, 1940

## 2017-08-22 ENCOUNTER — Ambulatory Visit: Payer: PPO | Attending: Family Medicine

## 2017-08-22 VITALS — BP 141/86 | HR 86

## 2017-08-22 DIAGNOSIS — M545 Low back pain: Secondary | ICD-10-CM | POA: Insufficient documentation

## 2017-08-22 DIAGNOSIS — M25652 Stiffness of left hip, not elsewhere classified: Secondary | ICD-10-CM | POA: Insufficient documentation

## 2017-08-22 DIAGNOSIS — G8929 Other chronic pain: Secondary | ICD-10-CM | POA: Diagnosis present

## 2017-08-22 DIAGNOSIS — M62830 Muscle spasm of back: Secondary | ICD-10-CM | POA: Insufficient documentation

## 2017-08-22 DIAGNOSIS — M6281 Muscle weakness (generalized): Secondary | ICD-10-CM | POA: Diagnosis present

## 2017-08-22 DIAGNOSIS — M25651 Stiffness of right hip, not elsewhere classified: Secondary | ICD-10-CM | POA: Diagnosis present

## 2017-08-22 DIAGNOSIS — R262 Difficulty in walking, not elsewhere classified: Secondary | ICD-10-CM | POA: Diagnosis present

## 2017-08-22 NOTE — Patient Instructions (Signed)
Sitting hip adduction ABDUCTION: Sitting - Resistance Band (Active)    Sit with feet flat. Lift right leg slightly and, against green  resistance band, draw it out to side.  CAN do one or both legs Complete _2-3__ sets of _10__ repetitions. Perform _1__ sessions per day.  Copyright  VHI. All rights reserved.  Adduction: Hip - Knees Together (Sitting)    Sit with towel roll between knees. Push knees together. Hold for _3-5__ seconds. Rest for _3-5__ seconds. Repeat _3x10__ times. Do _1__ times a day.  Copyright  VHI. All rights reserved.

## 2017-08-22 NOTE — Therapy (Addendum)
[place], [place], [address] Phone: [phone]   Fax:  [phone]  Physical Therapy Treatment/Discharge  Patient Details  Name: Stephanie Woodward MRN: [id number] Date of Birth: [birth year] Referring Provider: Donnie Coffin, MD   Encounter Date: 08/22/2017  PT End of Session - 08/22/17 1102    Visit Number  7    Number of Visits  12    Date for PT Re-Evaluation  09/09/17    PT Start Time  1100    PT Stop Time  1155    PT Time Calculation (min)  55 min    Activity Tolerance  Patient tolerated treatment well    Behavior During Therapy  [facility] for tasks assessed/performed       Past Medical History:  Diagnosis Date  . Arthritis   . Asthma   . CHF (congestive heart failure) ([place])   . CKD (chronic kidney disease), stage III ([place])   . COPD (chronic obstructive pulmonary disease) ([place])   . Diabetes mellitus    INSULIN DEPENDENT  . Gout   . Hyperlipemia   . Hypertension   . Psoriasis   . Renal disorder    congenital  . Single kidney     Past Surgical History:  Procedure Laterality Date  . ABDOMINAL HYSTERECTOMY    . CHOLECYSTECTOMY    . LEFT AND RIGHT HEART CATHETERIZATION WITH CORONARY ANGIOGRAM N/A 11/29/2013   Procedure: LEFT AND RIGHT HEART CATHETERIZATION WITH CORONARY ANGIOGRAM;  Surgeon: Jacolyn Reedy, MD;  Location: [facility] CATH LAB;  Service: Cardiovascular;  Laterality: N/A;    Vitals:   08/22/17 1107  BP: (!) 141/86  Pulse: 86  SpO2: 95%    Subjective Assessment - 08/22/17 1111    Currently in Pain?  Yes    Pain Score  8     Pain Location  Back    Pain Orientation  Lower;Right;Left    Pain Descriptors / Indicators  Throbbing    Pain Type  Chronic pain    Pain Onset  More than a month ago    Pain Frequency  Constant    Aggravating Factors   stand, SOB    Pain Relieving Factors  sit ,meds                       [facility] Adult PT Treatment/Exercise - 08/22/17 0001       Lumbar Exercises: Seated   Long Arc Quad on Chair  Right;Left;3 sets;10 reps    LAQ on Chair Weights (lbs)  4    Sit to Stand  15 reps HR 92 , O2 89    Other Seated Lumbar Exercises  seated QL stretch   2 x 5  then trunk flexion stretch x5 5-10 sec6    Other Seated Lumbar Exercises  clamd green band  and ball squeeze both 3/10      Moist Heat Therapy   Number Minutes Moist Heat  10 Minutes plus with exercisesing    Moist Heat Location  Lumbar Spine             PT Education - 08/22/17 1134    Education Details  sit abduciton /adduction with band or pillow    Person(s) Educated  Patient    Methods  Explanation;Tactile cues;Verbal cues;Handout    Comprehension  Returned demonstration;Verbalized understanding       PT Short Term Goals - 08/15/17 1217      PT SHORT TERM GOAL #1  Title  she will be independent with initial HEP    Baseline  she reports doinh HEP as able with breathing    Status  Achieved      PT SHORT TERM GOAL #2   Title  She will report pain decr 25% in lower back when on feet.     Baseline  reports 25% decr pain thouhg pain varies    Status  Achieved        PT Long Term Goals - 08/02/17 0927      PT LONG TERM GOAL #1   Title  She and her family will be independent with all HEP issued    Time  6    Period  Weeks    Status  New      PT LONG TERM GOAL #2   Title  She will report pain decr 50% or more with standing and walkin in home.     Time  6    Period  Weeks    Status  New      PT LONG TERM GOAL #3   Title  She will be able to walk 300 feet or more to improve community access    Time  6    Period  Weeks    Status  New      PT LONG TERM GOAL #4   Title  She will report 30% greater ease with dressing.     Time  6    Period  Weeks    Status  New            Plan - 08/22/17 1103    Clinical Impression Statement  HR max 96 BPM today and O2 sat 89%  lowest during session.  Unable to tolerate anything but sitting today so exercies  limited.  Continue to progress exercises but SOB and tolerance to being spine and side lye due to SOB limts activity    PT Treatment/Interventions  Electrical Stimulation;Moist Heat;Gait training;Therapeutic exercise;Therapeutic activities;Patient/family education;Manual techniques;Dry needling;Taping;Passive range of motion    PT Next Visit Plan  review HEP with stretching and PPT if able to lye down.  , MHP if needed   distance walk. Monitor SpO2. Progress HEP as able     PT Home Exercise Plan  LAQ , Bridge, hip abduction hip flexion, Rolling in bed, clam  and pillow squeeze for home green band issued.     Consulted and Agree with Plan of Care  Patient       Patient will benefit from skilled therapeutic intervention in order to improve the following deficits and impairments:  Pain, Postural dysfunction, Decreased activity tolerance, Decreased endurance, Decreased range of motion, Decreased strength, Difficulty walking, Impaired UE functional use, Increased muscle spasms  Visit Diagnosis: Chronic bilateral low back pain without sciatica  Muscle spasm of back  Stiffness of left hip, not elsewhere classified  Stiffness of right hip, not elsewhere classified  Muscle weakness (generalized)  Difficulty in walking, not elsewhere classified     Problem List Patient Active Problem List   Diagnosis Date Noted  . Atrial fibrillation with RVR ([place]) 04/08/2016  . Chest pain 04/04/2016  . Acute on chronic renal failure ([place]) 03/09/2016  . Diabetes mellitus with complication ([place]) 42/35/3614  . Foot pain, bilateral 03/09/2016  . Renal disorder   . AKI (acute kidney injury) ([place])   . Bradycardia   . CHF (congestive heart failure) ([place]) 02/06/2014  . Cellulitis of right lower extremity 02/06/2014  . Essential  hypertension 02/06/2014  . Hyperlipidemia 02/06/2014  . Diabetes mellitus type 2, controlled ([place]) 02/06/2014  . Cellulitis 02/06/2014  . OSA (obstructive sleep apnea) 01/22/2014  .  Hypoxemia 12/07/2013  . Type 2 diabetes mellitus, controlled, with renal complications ([place]) 93/81/0175  . Kidney disease, chronic, stage III (GFR 30-59 ml/min) ([place]) 12/07/2013  . Pulmonary hypertension ([place]) 12/07/2013  . Chronic systolic congestive heart failure ([place])   . Acute on chronic diastolic CHF (congestive heart failure), NYHA class 1 ([place]) 12/06/2013  . CHF exacerbation ([place]) 12/05/2013  . SOB (shortness of breath) 12/05/2013  . Gout 11/11/2009  . Obesity 11/11/2009  . Hypertensive heart disease     Darrel Hoover  PT 08/22/2017, 11:39 AM  [facility] [address] [place], [place], [address] Phone: [phone]   Fax:  [phone]  Name: Stephanie Woodward MRN: [id number] Date of Birth: [birth year]  PHYSICAL THERAPY DISCHARGE SUMMARY  Visits from Start of Care: 7  Current functional level related to goals / functional outcomes: Unknown as she returned to MD due to medical condition and did not return   Remaining deficits: Unknown   Education / Equipment: HEP Plan:                                                    Patient goals were partially met. Patient is being discharged due to not returning since the last visit.  ?????    Noralee Stain PT  10/27/17

## 2017-08-23 ENCOUNTER — Other Ambulatory Visit: Payer: Self-pay | Admitting: Cardiology

## 2017-08-23 ENCOUNTER — Ambulatory Visit
Admission: RE | Admit: 2017-08-23 | Discharge: 2017-08-23 | Disposition: A | Discharge status: Home / Self Care | Payer: PPO | Source: Ambulatory Visit | Attending: Cardiology | Admitting: Cardiology

## 2017-08-23 DIAGNOSIS — R0600 Dyspnea, unspecified: Secondary | ICD-10-CM

## 2017-08-23 DIAGNOSIS — I509 Heart failure, unspecified: Secondary | ICD-10-CM | POA: Diagnosis not present

## 2017-08-23 DIAGNOSIS — R0602 Shortness of breath: Secondary | ICD-10-CM | POA: Diagnosis not present

## 2017-08-24 ENCOUNTER — Ambulatory Visit: Payer: PPO | Admitting: Physical Therapy

## 2017-08-29 ENCOUNTER — Ambulatory Visit: Payer: PPO

## 2017-08-29 DIAGNOSIS — M259 Joint disorder, unspecified: Secondary | ICD-10-CM | POA: Diagnosis not present

## 2017-08-29 DIAGNOSIS — Z5181 Encounter for therapeutic drug level monitoring: Secondary | ICD-10-CM | POA: Diagnosis not present

## 2017-08-29 DIAGNOSIS — M545 Low back pain: Secondary | ICD-10-CM | POA: Diagnosis not present

## 2017-08-29 DIAGNOSIS — R0609 Other forms of dyspnea: Secondary | ICD-10-CM | POA: Diagnosis not present

## 2017-08-29 DIAGNOSIS — M5136 Other intervertebral disc degeneration, lumbar region: Secondary | ICD-10-CM | POA: Diagnosis not present

## 2017-09-01 ENCOUNTER — Telehealth: Payer: Self-pay | Admitting: Physical Therapy

## 2017-09-01 ENCOUNTER — Ambulatory Visit: Payer: PPO

## 2017-09-01 NOTE — Telephone Encounter (Signed)
Spoke to Ms Cogliano and she was on hold per MD. She will contact us if she has to cancel next week

## 2017-09-05 ENCOUNTER — Ambulatory Visit: Payer: PPO

## 2017-09-09 ENCOUNTER — Ambulatory Visit: Payer: PPO

## 2017-09-13 ENCOUNTER — Encounter: Payer: Self-pay | Admitting: Cardiology

## 2017-09-13 DIAGNOSIS — Z7901 Long term (current) use of anticoagulants: Secondary | ICD-10-CM | POA: Diagnosis not present

## 2017-09-13 DIAGNOSIS — J449 Chronic obstructive pulmonary disease, unspecified: Secondary | ICD-10-CM | POA: Diagnosis not present

## 2017-09-13 DIAGNOSIS — R0602 Shortness of breath: Secondary | ICD-10-CM | POA: Diagnosis not present

## 2017-09-13 DIAGNOSIS — E785 Hyperlipidemia, unspecified: Secondary | ICD-10-CM | POA: Diagnosis not present

## 2017-09-13 DIAGNOSIS — E669 Obesity, unspecified: Secondary | ICD-10-CM | POA: Diagnosis not present

## 2017-09-13 DIAGNOSIS — I119 Hypertensive heart disease without heart failure: Secondary | ICD-10-CM | POA: Diagnosis not present

## 2017-09-13 DIAGNOSIS — R0600 Dyspnea, unspecified: Secondary | ICD-10-CM | POA: Diagnosis not present

## 2017-09-13 DIAGNOSIS — I480 Paroxysmal atrial fibrillation: Secondary | ICD-10-CM | POA: Diagnosis not present

## 2017-09-13 DIAGNOSIS — I5032 Chronic diastolic (congestive) heart failure: Secondary | ICD-10-CM | POA: Diagnosis not present

## 2017-09-13 DIAGNOSIS — E1121 Type 2 diabetes mellitus with diabetic nephropathy: Secondary | ICD-10-CM | POA: Diagnosis not present

## 2017-09-13 DIAGNOSIS — N183 Chronic kidney disease, stage 3 (moderate): Secondary | ICD-10-CM | POA: Diagnosis not present

## 2017-09-13 DIAGNOSIS — R0609 Other forms of dyspnea: Secondary | ICD-10-CM | POA: Diagnosis not present

## 2017-09-13 NOTE — Progress Notes (Signed)
Stephanie Woodward    Date of visit:  09/13/2017 DOB:  04-01-1940    Age:  77 yrs. Medical record number:  [id number]     Account number:  [id number] Primary Care Provider: Donnie Coffin ____________________________ CURRENT DIAGNOSES  1. 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 Dyspnea, unspecified  3. Shortness of breath  4. Paroxysmal atrial fibrillation  5. Hypertensive heart disease without heart failure  6. Hyperlipidemia  7. Type 2 diabetes mellitus with diabetic nephropathy  8. Long term (current) use of anticoagulants  9. Obesity  10. Chronic obstructive pulmonary disease, unspecified  11. Chronic kidney disease, stage 3 (moderate) ____________________________ ALLERGIES  Penicillins, Intolerance-unknown ____________________________ MEDICATIONS  1. atorvastatin 80 mg tablet, 1 p.o. daily  2. diclofenac 1 % topical gel, Take as directed  3. fluticasone propionate 0.005 % topical ointment, Take as directed  4. furosemide 80 mg tablet, 2 tablets in the AM and 1 tablet in the evening  5. hydralazine 50 mg tablet, one tablet twice a day  6. latanoprost 0.005 % drops, 1 gtt ou qhs  7. metoprolol succinate ER 25 mg tablet,extended release 24 hr, BID  8. Novolog Flexpen 100 unit/mL subcutaneous, Take as directed  9. Nucynta 50 mg tablet, PRN  10. ProAir HFA 90 mcg/Actuation HFA Aerosol Inhaler, PRN  11. Tyler Aas FlexTouch U-100 insulin 100 unit/mL (3 mL) subcutaneous pen, Take as directed  12. triamcinolone acetonide 0.1 % topical cream, PRN  13. Xarelto 15 mg tablet, 1 p.o. daily ____________________________ CHIEF COMPLAINTS  Followup of Chronic diastolic (congestive) heart failure ____________________________ HISTORY OF PRESENT ILLNESS Patient seen for cardiac followup. Since she was previously here she had an echocardiogram showing an EF of around 45% with moderate LVH. She had some dyssynchrony due to left bundle-branch block. She continues to be severely  dyspneic with walking up stairs or other activity. Her BNP was only mildly elevated and she had only a mild amount of edema. She had moderate pulmonary hypertension previously but did not have significant coronary artery disease several years ago. She has not had any chest pain. She thinks that she feels somewhat better than she did before but is severely limited with back pain. Despite her best efforts we have not been able to get her to lose weight. Blood pressure has been borderline. ____________________________ PAST HISTORY  Past Medical Illnesses:  hypertension, DM-non-insulin dependent, obesity, history of pancreatitis, COPD, gout, history of H. Pylori infection, hyperlipidemia, chronic kidney disease Stage 3;  Cardiovascular Illnesses:  diastolic CHF, conduction disorder-LBBB, atrial fibrillation-paroxysmal;  Surgical Procedures:  cholecystectomy, hysterectomy;  NYHA Classification:  III;  Canadian Angina Classification:  Class 3: Angina with mild exertion;  Cardiology Procedures-Invasive:  cardiac cath (left) October 2015;  Cardiology Procedures-Noninvasive:  lexiscan cardiolite July 2011, echocardiogram October 2015;  Cardiac Cath Results:  no significant disease;  LVEF of 45% documented via echocardiogram on 08/29/2016,   CHA2DS2-VASC Score:  5 ____________________________ CARDIO-PULMONARY TEST DATES EKG Date:  08/23/2017;   Cardiac Cath Date:  11/29/2013;  Nuclear Study Date:  09/18/2009;  Echocardiography Date: 08/29/2017;  Chest Xray Date: 08/23/2017;   ____________________________ FAMILY HISTORY Brother -- Brother dead, Cancer Father -- Father dead, CVA Mother -- Mother dead, Cancer Sister -- Sister alive and well Sister -- Sister alive and well Sister -- Sister alive and well Sister -- Sister alive and well Sister -- Sister alive and well ____________________________ SOCIAL HISTORY Alcohol Use:  does not use alcohol;  Smoking:  used to smoke but quit, 20  pack year history;  Diet:   regular diet;  Lifestyle:  widowed;  Exercise:  exercise is limited due to physical disability;  Occupation:  retired Surveyor, minerals, later Oncologist;  Residence:  lives with daughter;   ____________________________ REVIEW OF SYSTEMS General:  malaise and fatigue Eyes: denies diplopia, history of glaucoma or visual problems. Respiratory: severe dyspnea with exertion Cardiovascular:  please review HPI Abdominal: denies dyspepsia, GI bleeding, constipation, or diarrhea Musculoskeletal:  arthritis of the legs, arthritis of her shoulder, chronic back pain Neurological:  peripheral neuropathy Psychiatric:  depression  ____________________________ PHYSICAL EXAMINATION VITAL SIGNS  Blood Pressure:  140/70 Sitting, Left arm, large cuff  , 140/80 Standing, Left arm and large cuff   Pulse:  84/min. Weight:  212.00 lbs. Height:  63.00"BMI: 37  Constitutional:  pleasant African American female in no acute distress, moderately obese walks with walker Skin:  warm and dry to touch, no apparent skin lesions, or masses noted. Head:  normocephalic, normal hair pattern, no masses or tenderness Neck:  supple, without massess. No JVD, thyromegaly or carotid bruits. Carotid upstroke normal. Chest:  normal symmetry, clear to auscultation Cardiac:  regular rhythm, normal S1 and S2, No S3 or S4, no murmurs, gallops or rubs detected. Peripheral Pulses:  the femoral,dorsalis pedis, and posterior tibial pulses are full and equal bilaterally with no bruits auscultated. Extremities & Back:  stasis pigmentation present, RLE venous insufficiency changes present, trace edema Neurological:  no gross motor or sensory deficits noted, affect appropriate, oriented x3. ____________________________ MOST RECENT LIPID PANEL 01/04/17  CHOL TOTL 159 mg/dl, LDL 76 NM, HDL 60 mg/dl, TRIGLYCER 115 mg/dl and CHOL/HDL 2.6 (Calc) ____________________________ IMPRESSIONS/PLAN  1. Chronic systolic and diastolic dysfunction 2. Severe  dyspnea probably multifactorial 3. History of pulmonary hypertension at least moderate by catheter a few years ago 4. Severe hypertensive heart disease 5. Significant obesity  Recommendations:  She continues to struggle with severe dyspnea. I suspect her volume status is okay. Her recent echo did not show a TR jet some unsure what her pulmonary hypertension is doing these days. I also wonder about the contribution of left bundle to her symptoms. I would like for her to have a consultation with the advanced heart failure clinic to see if there is anything else that can be done to help her dyspnea. ____________________________ TODAYS ORDERS  1. Comprehensive Metabolic Panel: Today  2. Return Visit: 2 months                       ____________________________ Cardiology Physician:  Kerry Hough MD [facility]

## 2017-09-26 DIAGNOSIS — Z5181 Encounter for therapeutic drug level monitoring: Secondary | ICD-10-CM | POA: Diagnosis not present

## 2017-09-26 DIAGNOSIS — M259 Joint disorder, unspecified: Secondary | ICD-10-CM | POA: Diagnosis not present

## 2017-09-26 DIAGNOSIS — M545 Low back pain: Secondary | ICD-10-CM | POA: Diagnosis not present

## 2017-09-26 DIAGNOSIS — M5136 Other intervertebral disc degeneration, lumbar region: Secondary | ICD-10-CM | POA: Diagnosis not present

## 2017-10-06 DIAGNOSIS — R0600 Dyspnea, unspecified: Secondary | ICD-10-CM | POA: Diagnosis not present

## 2017-10-06 DIAGNOSIS — I129 Hypertensive chronic kidney disease with stage 1 through stage 4 chronic kidney disease, or unspecified chronic kidney disease: Secondary | ICD-10-CM | POA: Diagnosis not present

## 2017-10-06 DIAGNOSIS — D631 Anemia in chronic kidney disease: Secondary | ICD-10-CM | POA: Diagnosis not present

## 2017-10-06 DIAGNOSIS — E875 Hyperkalemia: Secondary | ICD-10-CM | POA: Diagnosis not present

## 2017-10-06 DIAGNOSIS — N179 Acute kidney failure, unspecified: Secondary | ICD-10-CM | POA: Diagnosis not present

## 2017-10-06 DIAGNOSIS — E669 Obesity, unspecified: Secondary | ICD-10-CM | POA: Diagnosis not present

## 2017-10-06 DIAGNOSIS — N183 Chronic kidney disease, stage 3 (moderate): Secondary | ICD-10-CM | POA: Diagnosis not present

## 2017-10-06 DIAGNOSIS — E1122 Type 2 diabetes mellitus with diabetic chronic kidney disease: Secondary | ICD-10-CM | POA: Diagnosis not present

## 2017-10-20 ENCOUNTER — Ambulatory Visit
Admission: RE | Admit: 2017-10-20 | Discharge: 2017-10-20 | Disposition: A | Discharge status: Home / Self Care | Payer: PPO | Source: Ambulatory Visit | Attending: Family Medicine | Admitting: Family Medicine

## 2017-10-20 ENCOUNTER — Other Ambulatory Visit: Payer: Self-pay | Admitting: Family Medicine

## 2017-10-20 DIAGNOSIS — E1121 Type 2 diabetes mellitus with diabetic nephropathy: Secondary | ICD-10-CM | POA: Diagnosis not present

## 2017-10-20 DIAGNOSIS — R05 Cough: Secondary | ICD-10-CM

## 2017-10-20 DIAGNOSIS — J449 Chronic obstructive pulmonary disease, unspecified: Secondary | ICD-10-CM | POA: Diagnosis not present

## 2017-10-20 DIAGNOSIS — R5383 Other fatigue: Secondary | ICD-10-CM | POA: Diagnosis not present

## 2017-10-20 DIAGNOSIS — R059 Cough, unspecified: Secondary | ICD-10-CM

## 2017-10-25 DIAGNOSIS — M545 Low back pain: Secondary | ICD-10-CM | POA: Diagnosis not present

## 2017-10-25 DIAGNOSIS — Z5181 Encounter for therapeutic drug level monitoring: Secondary | ICD-10-CM | POA: Diagnosis not present

## 2017-10-25 DIAGNOSIS — M5136 Other intervertebral disc degeneration, lumbar region: Secondary | ICD-10-CM | POA: Diagnosis not present

## 2017-10-25 DIAGNOSIS — M259 Joint disorder, unspecified: Secondary | ICD-10-CM | POA: Diagnosis not present

## 2017-11-17 ENCOUNTER — Ambulatory Visit (HOSPITAL_COMMUNITY)
Admission: RE | Admit: 2017-11-17 | Discharge: 2017-11-17 | Disposition: A | Discharge status: Home / Self Care | Payer: PPO | Source: Ambulatory Visit | Attending: Cardiology | Admitting: Cardiology

## 2017-11-17 ENCOUNTER — Encounter (HOSPITAL_COMMUNITY): Payer: Self-pay | Admitting: Cardiology

## 2017-11-17 ENCOUNTER — Encounter (HOSPITAL_COMMUNITY): Payer: Self-pay | Admitting: *Deleted

## 2017-11-17 ENCOUNTER — Other Ambulatory Visit (HOSPITAL_COMMUNITY): Payer: Self-pay | Admitting: *Deleted

## 2017-11-17 VITALS — BP 158/68 | HR 103 | Ht 62.0 in | Wt 210.6 lb

## 2017-11-17 DIAGNOSIS — Z87891 Personal history of nicotine dependence: Secondary | ICD-10-CM | POA: Insufficient documentation

## 2017-11-17 DIAGNOSIS — I5022 Chronic systolic (congestive) heart failure: Secondary | ICD-10-CM

## 2017-11-17 DIAGNOSIS — I130 Hypertensive heart and chronic kidney disease with heart failure and stage 1 through stage 4 chronic kidney disease, or unspecified chronic kidney disease: Secondary | ICD-10-CM | POA: Diagnosis not present

## 2017-11-17 DIAGNOSIS — E785 Hyperlipidemia, unspecified: Secondary | ICD-10-CM | POA: Diagnosis not present

## 2017-11-17 DIAGNOSIS — I480 Paroxysmal atrial fibrillation: Secondary | ICD-10-CM | POA: Diagnosis not present

## 2017-11-17 DIAGNOSIS — Z8269 Family history of other diseases of the musculoskeletal system and connective tissue: Secondary | ICD-10-CM | POA: Insufficient documentation

## 2017-11-17 DIAGNOSIS — Z794 Long term (current) use of insulin: Secondary | ICD-10-CM | POA: Insufficient documentation

## 2017-11-17 DIAGNOSIS — Z79899 Other long term (current) drug therapy: Secondary | ICD-10-CM | POA: Diagnosis not present

## 2017-11-17 DIAGNOSIS — Z7901 Long term (current) use of anticoagulants: Secondary | ICD-10-CM | POA: Diagnosis not present

## 2017-11-17 DIAGNOSIS — Z823 Family history of stroke: Secondary | ICD-10-CM | POA: Diagnosis not present

## 2017-11-17 DIAGNOSIS — N183 Chronic kidney disease, stage 3 unspecified: Secondary | ICD-10-CM

## 2017-11-17 DIAGNOSIS — I5032 Chronic diastolic (congestive) heart failure: Secondary | ICD-10-CM | POA: Insufficient documentation

## 2017-11-17 DIAGNOSIS — E1122 Type 2 diabetes mellitus with diabetic chronic kidney disease: Secondary | ICD-10-CM | POA: Insufficient documentation

## 2017-11-17 DIAGNOSIS — E8589 Other amyloidosis: Secondary | ICD-10-CM

## 2017-11-17 DIAGNOSIS — J449 Chronic obstructive pulmonary disease, unspecified: Secondary | ICD-10-CM | POA: Insufficient documentation

## 2017-11-17 DIAGNOSIS — I2510 Atherosclerotic heart disease of native coronary artery without angina pectoris: Secondary | ICD-10-CM | POA: Insufficient documentation

## 2017-11-17 LAB — BASIC METABOLIC PANEL
Anion gap: 9 (ref 5–15)
BUN: 45 mg/dL — ABNORMAL HIGH (ref 8–23)
CO2: 28 mmol/L (ref 22–32)
Calcium: 8.7 mg/dL — ABNORMAL LOW (ref 8.9–10.3)
Chloride: 108 mmol/L (ref 98–111)
Creatinine, Ser: 2.45 mg/dL — ABNORMAL HIGH (ref 0.44–1.00)
GFR calc Af Amer: 21 mL/min — ABNORMAL LOW (ref >60)
GFR calc non Af Amer: 18 mL/min — ABNORMAL LOW (ref >60)
Glucose, Bld: 152 mg/dL — ABNORMAL HIGH (ref 70–99)
Potassium: 4.4 mmol/L (ref 3.5–5.1)
Sodium: 145 mmol/L (ref 135–145)

## 2017-11-17 LAB — BRAIN NATRIURETIC PEPTIDE: B Natriuretic Peptide: 251.5 pg/mL — ABNORMAL HIGH (ref 0.0–100.0)

## 2017-11-17 MED ORDER — TORSEMIDE 20 MG PO TABS: ORAL_TABLET | ORAL | 3 refills | Status: DC

## 2017-11-17 MED ORDER — HYDRALAZINE HCL 50 MG PO TABS: 75.0000 mg | ORAL_TABLET | Freq: Two times a day (BID) | ORAL | 3 refills | Status: DC

## 2017-11-17 MED ORDER — ISOSORBIDE MONONITRATE ER 30 MG PO TB24: 30.0000 mg | ORAL_TABLET | Freq: Every day | ORAL | 3 refills | Status: DC

## 2017-11-17 NOTE — Patient Instructions (Signed)
Labs today (will call for abnormal results, otherwise no news is good news)  STOP taking Lasix  START taking Torsemide 80 mg (4 TAbs) in the AM and 40 mg (2 Tabs) in the PM  INCREASE Hydralazine to 75 mg (1.5 Tablets) twice daily  START taking Imdur 30 mg Once daily  PYP scan has been ordered for you, [facility] office will contact you to schedule test.  You have been scheduled for a Right Heart Catheterization, please see attached instruction sheet.  Echocardiogram and Follow up in 3 weeks.

## 2017-11-17 NOTE — Progress Notes (Signed)
PCP: Alroy Dust, L.Marlou Sa, MD Cardiology: Dr. Wynonia Lawman HF Cardiology: Dr. Aundra Dubin  77 y.o.with history of CKD stage 3, paroxysmal atrial fibrillation, and chronic diastolic CHF was referred by Dr. Wynonia Lawman for evaluation of CHF.  RHC/LHC was done in 10/15.  This showed markedly elevated filling pressures and pulmonary venous hypertension.  There was no significant CAD.  Last echo in 2/18 showed EF 50-55%, mild LVH, grade 2 diastolic dysfunction. She is followed by Dr. Moshe Cipro for nephrology.   She has been struggling recently.  Generally fatigued with poor energy.  Chronic cough x 3 months.  No fever.  She no longer smokes.  She has trouble sleeping, uses 2 pillows.  She is short of breath walking about 50 feet.  This is getting worse.  She is very short of breath with stairs.  No chest pain. No lightheadedness.    ECG (personally reviewed): NSR, left axis deviation, IVCD 122 msec, inferior Qs, old anterior MI  Labs (2/18): K 4, creatinine 1.79  PMH: 1. Atrial fibrillation: Paroxysmal.  2. HTN 3. 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  5. CKD stage 3 6. H/o IVCD 7. COPD 8. Gout 9. Chronic primarily diastolic CHF: Echo (6/75) with EF 50-55%, mild LVH, grade 2 diastolic dysfunction.  - LHC/RHC (10/15): small distal LAD but no discrete stenosis.  Mean RA 15, PA 55/20 mean 34, mean PCWP 31 => pulmonary venous hypertension.  10. Sleep study 2016: Negative per patient.   Social History   Socioeconomic History  . Marital status: Widowed    Spouse name: Not on file  . Number of children: 2  . Years of education: Not on file  . Highest education level: Not on file  Occupational History  . Occupation: retired  Scientific laboratory technician  . Financial resource strain: Not on file  . Food insecurity:    Worry: Not on file    Inability: Not on file  . Transportation needs:    Medical: Not on file    Non-medical: Not on file  Tobacco Use  . Smoking status: Former Smoker    Packs/day:  1.00    Years: 20.00    Pack years: 20.00    Types: Cigarettes    Last attempt to quit: 02/22/2009    Years since quitting: 8.7  . Smokeless tobacco: Never Used  Substance and Sexual Activity  . Alcohol use: No    Alcohol/week: 0.0 standard drinks    Comment: quit 2011  . Drug use: No  . Sexual activity: Never  Lifestyle  . Physical activity:    Days per week: Not on file    Minutes per session: Not on file  . Stress: Not on file  Relationships  . Social connections:    Talks on phone: Not on file    Gets together: Not on file    Attends religious service: Not on file    Active member of club or organization: Not on file    Attends meetings of clubs or organizations: Not on file    Relationship status: Not on file  . Intimate partner violence:    Fear of current or ex partner: Not on file    Emotionally abused: Not on file    Physically abused: Not on file    Forced sexual activity: Not on file  Other Topics Concern  . Not on file  Social History Narrative  . Not on file   Family History  Problem Relation Age of Onset  . Lupus Daughter   .  Cancer Mother 81  . CVA Father 16  . Cancer Brother 45   ROS: All systems reviewed and negative except as per HPI.   Current Outpatient Medications  Medication Sig Dispense Refill  . albuterol (PROVENTIL HFA;VENTOLIN HFA) 108 (90 BASE) MCG/ACT inhaler Inhale 2 puffs into the lungs every 4 (four) hours as needed for shortness of breath.     Marland Kitchen atorvastatin (LIPITOR) 80 MG tablet Take 80 mg by mouth daily.    . calcitRIOL (ROCALTROL) 0.25 MCG capsule Take 0.25 mcg by mouth daily.    . Fluticasone-Emollient 0.05 % KIT Apply topically.    . hydrALAZINE (APRESOLINE) 50 MG tablet Take 1.5 tablets (75 mg total) by mouth 2 (two) times daily. 90 tablet 3  . insulin aspart (NOVOLOG) 100 UNIT/ML injection Inject 10 Units into the skin 2 (two) times daily.    . insulin degludec (TRESIBA FLEXTOUCH) 100 UNIT/ML SOPN FlexTouch Pen Inject 480  Units into the skin daily.    Marland Kitchen latanoprost (XALATAN) 0.005 % ophthalmic solution Place 1 drop into both eyes at bedtime.    . metoprolol succinate (TOPROL-XL) 25 MG 24 hr tablet Take 25 mg by mouth 2 (two) times daily.    . repaglinide (PRANDIN) 1 MG tablet Take 5 mg by mouth 3 (three) times daily before meals.    . Rivaroxaban (XARELTO) 15 MG TABS tablet Take 1 tablet (15 mg total) by mouth daily with supper. 60 tablet 0  . tapentadol (NUCYNTA) 50 MG tablet Take 50 mg by mouth 2 (two) times daily.    . traMADol (ULTRAM) 50 MG tablet Take 50 mg by mouth 2 (two) times daily.    Marland Kitchen triamcinolone cream (KENALOG) 0.1 % Apply 1 application topically 2 (two) times daily.    . isosorbide mononitrate (IMDUR) 30 MG 24 hr tablet Take 1 tablet (30 mg total) by mouth daily. 30 tablet 3  . torsemide (DEMADEX) 20 MG tablet Take 80 mg (4 Tablets) in the AM and 40 mg (2 Tablets) in the PM 180 tablet 3   No current facility-administered medications for this encounter.    General: NAD Neck: JVP 12 cm, no thyromegaly or thyroid nodule.  Lungs: Clear to auscultation bilaterally with normal respiratory effort. CV: Nondisplaced PMI.  Heart regular S1/S2, no S3/S4, no murmur.  1+ edema 1/2 to knees bilaterally.  No carotid bruit.  Normal pedal pulses.  Abdomen: Soft, nontender, no hepatosplenomegaly, no distention.  Skin: Intact without lesions or rashes.  Neurologic: Alert and oriented x 3.  Psych: Normal affect. Extremities: No clubbing or cyanosis.  HEENT: Normal.   Assessment/Plan: 1. Chronic diastolic CHF: Last echo in 2/18 with EF 50-55%, mild LVH, grade 2 diastolic dysfunction.  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  NYHA class IIIb symptoms, gradually worsening and complicated by CKD stage 3.  On exam, she is volume overloaded.   - Repeat echo.  - I will arrange for RHC to assess filling pressures, pulmonary pressure, and cardiac output.   We discussed risks/benefits and she agrees to procedure.  She will hold Xarelto the day before and the day of procedure.  - Cardiac amyloidosis is a consideration here.  LVH and diastolic CHF could be related to HTN, but need to rule out amyloidosis.  I will send a myeloma panel and will arrange a PYP scan.  She cannot get contrast with MRI due to elevated creatinine.  - Stop Lasix.  Start torsemide 80 qam/40 qpm.  BMET today and again in 10 days.  2. COPD: She no longer smokes.  I will get PFTs when better diuresed to assess for contribution of lung disease to symptoms.   3. HTN: Increase hydralazine to 75 mg bid and will add Imdur 30 mg daily.  4. CKD: Stage 3.  Follow closely with diuresis.  5. Atrial fibrillation: Paroxysmal.  She is in NSR today.  - Continue Xarelto 15 mg daily.   Loralie Champagne 11/17/2017

## 2017-11-18 ENCOUNTER — Telehealth (HOSPITAL_COMMUNITY): Payer: Self-pay

## 2017-11-18 LAB — MULTIPLE MYELOMA PANEL, SERUM
Albumin SerPl Elph-Mcnc: 3.2 g/dL (ref 2.9–4.4)
Albumin/Glob SerPl: 1.1 (ref 0.7–1.7)
Alpha 1: 0.3 g/dL (ref 0.0–0.4)
Alpha2 Glob SerPl Elph-Mcnc: 1.1 g/dL — ABNORMAL HIGH (ref 0.4–1.0)
B-Globulin SerPl Elph-Mcnc: 0.9 g/dL (ref 0.7–1.3)
Gamma Glob SerPl Elph-Mcnc: 0.7 g/dL (ref 0.4–1.8)
Globulin, Total: 3 g/dL (ref 2.2–3.9)
IgA: 343 mg/dL (ref 64–422)
IgG (Immunoglobin G), Serum: 767 mg/dL (ref 700–1600)
IgM (Immunoglobulin M), Srm: 65 mg/dL (ref 26–217)
Total Protein ELP: 6.2 g/dL (ref 6.0–8.5)

## 2017-11-18 NOTE — Telephone Encounter (Signed)
Pt called with no answer, left message for her to call back

## 2017-11-21 DIAGNOSIS — M259 Joint disorder, unspecified: Secondary | ICD-10-CM | POA: Diagnosis not present

## 2017-11-21 DIAGNOSIS — Z5181 Encounter for therapeutic drug level monitoring: Secondary | ICD-10-CM | POA: Diagnosis not present

## 2017-11-21 DIAGNOSIS — M5136 Other intervertebral disc degeneration, lumbar region: Secondary | ICD-10-CM | POA: Diagnosis not present

## 2017-11-21 DIAGNOSIS — M545 Low back pain: Secondary | ICD-10-CM | POA: Diagnosis not present

## 2017-11-23 ENCOUNTER — Other Ambulatory Visit: Payer: Self-pay

## 2017-11-23 ENCOUNTER — Inpatient Hospital Stay (HOSPITAL_COMMUNITY): Payer: PPO

## 2017-11-23 ENCOUNTER — Encounter (HOSPITAL_COMMUNITY): Payer: Self-pay | Admitting: Cardiology

## 2017-11-23 ENCOUNTER — Inpatient Hospital Stay (HOSPITAL_COMMUNITY)
Admission: RE | Admit: 2017-11-23 | Discharge: 2017-11-27 | DRG: 286 | Disposition: A | Discharge status: Home Health | Payer: PPO | Source: Ambulatory Visit | Attending: Cardiology | Admitting: Cardiology

## 2017-11-23 ENCOUNTER — Inpatient Hospital Stay (HOSPITAL_COMMUNITY)
Admission: RE | Disposition: A | Discharge status: Home Health | Payer: Self-pay | Source: Ambulatory Visit | Attending: Cardiology

## 2017-11-23 DIAGNOSIS — I130 Hypertensive heart and chronic kidney disease with heart failure and stage 1 through stage 4 chronic kidney disease, or unspecified chronic kidney disease: Secondary | ICD-10-CM | POA: Diagnosis present

## 2017-11-23 DIAGNOSIS — Z7951 Long term (current) use of inhaled steroids: Secondary | ICD-10-CM

## 2017-11-23 DIAGNOSIS — I5033 Acute on chronic diastolic (congestive) heart failure: Secondary | ICD-10-CM

## 2017-11-23 DIAGNOSIS — E1122 Type 2 diabetes mellitus with diabetic chronic kidney disease: Secondary | ICD-10-CM | POA: Diagnosis not present

## 2017-11-23 DIAGNOSIS — I5022 Chronic systolic (congestive) heart failure: Secondary | ICD-10-CM

## 2017-11-23 DIAGNOSIS — Z79899 Other long term (current) drug therapy: Secondary | ICD-10-CM | POA: Diagnosis not present

## 2017-11-23 DIAGNOSIS — E785 Hyperlipidemia, unspecified: Secondary | ICD-10-CM | POA: Diagnosis present

## 2017-11-23 DIAGNOSIS — J449 Chronic obstructive pulmonary disease, unspecified: Secondary | ICD-10-CM | POA: Diagnosis present

## 2017-11-23 DIAGNOSIS — R918 Other nonspecific abnormal finding of lung field: Secondary | ICD-10-CM | POA: Diagnosis not present

## 2017-11-23 DIAGNOSIS — I5043 Acute on chronic combined systolic (congestive) and diastolic (congestive) heart failure: Secondary | ICD-10-CM | POA: Diagnosis present

## 2017-11-23 DIAGNOSIS — I272 Pulmonary hypertension, unspecified: Secondary | ICD-10-CM | POA: Diagnosis present

## 2017-11-23 DIAGNOSIS — N183 Chronic kidney disease, stage 3 unspecified: Secondary | ICD-10-CM | POA: Diagnosis present

## 2017-11-23 DIAGNOSIS — I119 Hypertensive heart disease without heart failure: Secondary | ICD-10-CM | POA: Diagnosis present

## 2017-11-23 DIAGNOSIS — I480 Paroxysmal atrial fibrillation: Secondary | ICD-10-CM | POA: Diagnosis present

## 2017-11-23 DIAGNOSIS — Z91012 Allergy to eggs: Secondary | ICD-10-CM | POA: Diagnosis not present

## 2017-11-23 DIAGNOSIS — I5021 Acute systolic (congestive) heart failure: Secondary | ICD-10-CM

## 2017-11-23 DIAGNOSIS — Z7901 Long term (current) use of anticoagulants: Secondary | ICD-10-CM | POA: Diagnosis not present

## 2017-11-23 DIAGNOSIS — I5030 Unspecified diastolic (congestive) heart failure: Secondary | ICD-10-CM

## 2017-11-23 DIAGNOSIS — N1330 Unspecified hydronephrosis: Secondary | ICD-10-CM | POA: Diagnosis present

## 2017-11-23 DIAGNOSIS — Z888 Allergy status to other drugs, medicaments and biological substances status: Secondary | ICD-10-CM

## 2017-11-23 DIAGNOSIS — Z794 Long term (current) use of insulin: Secondary | ICD-10-CM | POA: Diagnosis not present

## 2017-11-23 DIAGNOSIS — Z87891 Personal history of nicotine dependence: Secondary | ICD-10-CM

## 2017-11-23 DIAGNOSIS — I2510 Atherosclerotic heart disease of native coronary artery without angina pectoris: Secondary | ICD-10-CM | POA: Diagnosis present

## 2017-11-23 DIAGNOSIS — E118 Type 2 diabetes mellitus with unspecified complications: Secondary | ICD-10-CM

## 2017-11-23 DIAGNOSIS — Z880 Allergy status to penicillin: Secondary | ICD-10-CM

## 2017-11-23 DIAGNOSIS — I509 Heart failure, unspecified: Secondary | ICD-10-CM | POA: Diagnosis not present

## 2017-11-23 HISTORY — PX: RIGHT HEART CATH: CATH118263

## 2017-11-23 LAB — BASIC METABOLIC PANEL
Anion gap: 12 (ref 5–15)
BUN: 39 mg/dL — ABNORMAL HIGH (ref 8–23)
CO2: 28 mmol/L (ref 22–32)
Calcium: 8.3 mg/dL — ABNORMAL LOW (ref 8.9–10.3)
Chloride: 101 mmol/L (ref 98–111)
Creatinine, Ser: 2.29 mg/dL — ABNORMAL HIGH (ref 0.44–1.00)
GFR calc Af Amer: 23 mL/min — ABNORMAL LOW (ref >60)
GFR calc non Af Amer: 20 mL/min — ABNORMAL LOW (ref >60)
Glucose, Bld: 96 mg/dL (ref 70–99)
Potassium: 3.8 mmol/L (ref 3.5–5.1)
Sodium: 141 mmol/L (ref 135–145)

## 2017-11-23 LAB — ECHOCARDIOGRAM COMPLETE
Height: 62 in
Weight: 3360 oz

## 2017-11-23 LAB — CBC
HCT: 36.8 % (ref 36.0–46.0)
Hemoglobin: 10.8 g/dL — ABNORMAL LOW (ref 12.0–15.0)
MCH: 24.5 pg — ABNORMAL LOW (ref 26.0–34.0)
MCHC: 29.3 g/dL — ABNORMAL LOW (ref 30.0–36.0)
MCV: 83.6 fL (ref 78.0–100.0)
Platelets: 344 10*3/uL (ref 150–400)
RBC: 4.4 MIL/uL (ref 3.87–5.11)
RDW: 15.8 % — ABNORMAL HIGH (ref 11.5–15.5)
WBC: 12.5 10*3/uL — ABNORMAL HIGH (ref 4.0–10.5)

## 2017-11-23 LAB — GLUCOSE, CAPILLARY
Glucose-Capillary: 96 mg/dL (ref 70–99)
Glucose-Capillary: 170 mg/dL — ABNORMAL HIGH (ref 70–99)
Glucose-Capillary: 256 mg/dL — ABNORMAL HIGH (ref 70–99)
Glucose-Capillary: 101 mg/dL — ABNORMAL HIGH (ref 70–99)

## 2017-11-23 LAB — BRAIN NATRIURETIC PEPTIDE: B Natriuretic Peptide: 271.8 pg/mL — ABNORMAL HIGH (ref 0.0–100.0)

## 2017-11-23 SURGERY — RIGHT HEART CATH
Anesthesia: LOCAL

## 2017-11-23 MED ORDER — LATANOPROST 0.005 % OP SOLN
1.0000 [drp] | Freq: Every day | OPHTHALMIC | Status: DC
  Administered 2017-11-23 – 2017-11-26 (×4): 1 [drp] via OPHTHALMIC
  Filled 2017-11-23: qty 2.5

## 2017-11-23 MED ORDER — SODIUM CHLORIDE 0.9% FLUSH: 3.0000 mL | Freq: Two times a day (BID) | INTRAVENOUS | Status: DC

## 2017-11-23 MED ORDER — ASPIRIN 81 MG PO CHEW
81.0000 mg | CHEWABLE_TABLET | ORAL | Status: AC
  Administered 2017-11-23: 81 mg via ORAL

## 2017-11-23 MED ORDER — ATORVASTATIN CALCIUM 80 MG PO TABS
80.0000 mg | ORAL_TABLET | Freq: Every day | ORAL | Status: DC
  Administered 2017-11-23 – 2017-11-26 (×4): 80 mg via ORAL
  Filled 2017-11-23 (×5): qty 1

## 2017-11-23 MED ORDER — FLUTICASONE PROPIONATE 0.005 % EX OINT: 1.0000 "application " | TOPICAL_OINTMENT | Freq: Two times a day (BID) | CUTANEOUS | Status: DC

## 2017-11-23 MED ORDER — SODIUM CHLORIDE 0.9% FLUSH
3.0000 mL | Freq: Two times a day (BID) | INTRAVENOUS | Status: DC
  Administered 2017-11-24 – 2017-11-27 (×7): 3 mL via INTRAVENOUS

## 2017-11-23 MED ORDER — ONDANSETRON HCL 4 MG/2ML IJ SOLN: 4.0000 mg | Freq: Four times a day (QID) | INTRAMUSCULAR | Status: DC | PRN

## 2017-11-23 MED ORDER — HYDRALAZINE HCL 50 MG PO TABS
75.0000 mg | ORAL_TABLET | Freq: Two times a day (BID) | ORAL | Status: DC
  Administered 2017-11-23 – 2017-11-27 (×8): 75 mg via ORAL
  Filled 2017-11-23 (×9): qty 1

## 2017-11-23 MED ORDER — TRAMADOL HCL 50 MG PO TABS
50.0000 mg | ORAL_TABLET | Freq: Four times a day (QID) | ORAL | Status: DC | PRN
  Administered 2017-11-24: 50 mg via ORAL
  Filled 2017-11-23: qty 1

## 2017-11-23 MED ORDER — GUAIFENESIN ER 600 MG PO TB12: 600.0000 mg | ORAL_TABLET | Freq: Every day | ORAL | Status: DC | PRN

## 2017-11-23 MED ORDER — LIDOCAINE HCL (PF) 1 % IJ SOLN
INTRAMUSCULAR | Status: DC | PRN
  Administered 2017-11-23: 2 mL

## 2017-11-23 MED ORDER — SODIUM CHLORIDE 0.9 % IV SOLN: 250.0000 mL | INTRAVENOUS | Status: DC | PRN

## 2017-11-23 MED ORDER — TRIAMCINOLONE ACETONIDE 0.1 % EX CREA
1.0000 "application " | TOPICAL_CREAM | Freq: Two times a day (BID) | CUTANEOUS | Status: DC
  Administered 2017-11-23 – 2017-11-27 (×8): 1 via TOPICAL
  Filled 2017-11-23: qty 15

## 2017-11-23 MED ORDER — ALBUTEROL SULFATE (2.5 MG/3ML) 0.083% IN NEBU: 3.0000 mL | INHALATION_SOLUTION | RESPIRATORY_TRACT | Status: DC | PRN

## 2017-11-23 MED ORDER — METOPROLOL SUCCINATE ER 25 MG PO TB24
25.0000 mg | ORAL_TABLET | Freq: Two times a day (BID) | ORAL | Status: DC
  Administered 2017-11-23 – 2017-11-27 (×8): 25 mg via ORAL
  Filled 2017-11-23 (×8): qty 1

## 2017-11-23 MED ORDER — INSULIN GLARGINE 100 UNIT/ML ~~LOC~~ SOLN
40.0000 [IU] | Freq: Every day | SUBCUTANEOUS | Status: DC
  Administered 2017-11-24 – 2017-11-27 (×4): 40 [IU] via SUBCUTANEOUS
  Filled 2017-11-23 (×5): qty 0.4

## 2017-11-23 MED ORDER — POTASSIUM CHLORIDE CRYS ER 20 MEQ PO TBCR
40.0000 meq | EXTENDED_RELEASE_TABLET | Freq: Two times a day (BID) | ORAL | Status: DC
  Administered 2017-11-23 – 2017-11-27 (×9): 40 meq via ORAL
  Filled 2017-11-23 (×9): qty 2

## 2017-11-23 MED ORDER — SODIUM CHLORIDE 0.9% FLUSH: 3.0000 mL | INTRAVENOUS | Status: DC | PRN

## 2017-11-23 MED ORDER — SODIUM CHLORIDE 0.9 % IV SOLN
INTRAVENOUS | Status: DC
  Administered 2017-11-23: 09:00:00 via INTRAVENOUS

## 2017-11-23 MED ORDER — ISOSORBIDE MONONITRATE ER 30 MG PO TB24
30.0000 mg | ORAL_TABLET | Freq: Every day | ORAL | Status: DC
  Administered 2017-11-23 – 2017-11-27 (×5): 30 mg via ORAL
  Filled 2017-11-23 (×5): qty 1

## 2017-11-23 MED ORDER — INSULIN DEGLUDEC 100 UNIT/ML ~~LOC~~ SOPN: 40.0000 [IU] | PEN_INJECTOR | Freq: Every morning | SUBCUTANEOUS | Status: DC

## 2017-11-23 MED ORDER — INSULIN ASPART 100 UNIT/ML ~~LOC~~ SOLN
0.0000 [IU] | Freq: Three times a day (TID) | SUBCUTANEOUS | Status: DC
  Administered 2017-11-23: 8 [IU] via SUBCUTANEOUS
  Administered 2017-11-24: 2 [IU] via SUBCUTANEOUS
  Administered 2017-11-25 (×2): 5 [IU] via SUBCUTANEOUS
  Administered 2017-11-26: 3 [IU] via SUBCUTANEOUS

## 2017-11-23 MED ORDER — CALCITRIOL 0.25 MCG PO CAPS
0.2500 ug | ORAL_CAPSULE | Freq: Every day | ORAL | Status: DC
  Administered 2017-11-24 – 2017-11-27 (×4): 0.25 ug via ORAL
  Filled 2017-11-23 (×4): qty 1

## 2017-11-23 MED ORDER — HEPARIN (PORCINE) IN NACL 1000-0.9 UT/500ML-% IV SOLN
INTRAVENOUS | Status: AC
  Filled 2017-11-23: qty 500

## 2017-11-23 MED ORDER — ACETAMINOPHEN 325 MG PO TABS: 650.0000 mg | ORAL_TABLET | ORAL | Status: DC | PRN

## 2017-11-23 MED ORDER — ASPIRIN 81 MG PO CHEW
CHEWABLE_TABLET | ORAL | Status: AC
  Administered 2017-11-23: 81 mg via ORAL
  Filled 2017-11-23: qty 1

## 2017-11-23 MED ORDER — INSULIN ASPART 100 UNIT/ML ~~LOC~~ SOLN
4.0000 [IU] | Freq: Three times a day (TID) | SUBCUTANEOUS | Status: DC
  Administered 2017-11-23 – 2017-11-26 (×8): 4 [IU] via SUBCUTANEOUS

## 2017-11-23 MED ORDER — FUROSEMIDE 10 MG/ML IJ SOLN
80.0000 mg | Freq: Three times a day (TID) | INTRAMUSCULAR | Status: DC
  Administered 2017-11-23 – 2017-11-26 (×9): 80 mg via INTRAVENOUS
  Filled 2017-11-23 (×9): qty 8

## 2017-11-23 MED ORDER — LIDOCAINE HCL (PF) 1 % IJ SOLN
INTRAMUSCULAR | Status: AC
  Filled 2017-11-23: qty 30

## 2017-11-23 MED ORDER — TAPENTADOL HCL 50 MG PO TABS
50.0000 mg | ORAL_TABLET | Freq: Two times a day (BID) | ORAL | Status: DC
  Administered 2017-11-23 – 2017-11-27 (×8): 50 mg via ORAL
  Filled 2017-11-23 (×8): qty 1

## 2017-11-23 MED ORDER — REPAGLINIDE 0.5 MG PO TABS
0.5000 mg | ORAL_TABLET | Freq: Three times a day (TID) | ORAL | Status: DC
  Administered 2017-11-23 – 2017-11-27 (×10): 0.5 mg via ORAL
  Filled 2017-11-23 (×9): qty 1
  Filled 2017-11-23: qty 0.5
  Filled 2017-11-23 (×2): qty 1
  Filled 2017-11-23: qty 0.5
  Filled 2017-11-23: qty 1

## 2017-11-23 MED ORDER — INSULIN ASPART 100 UNIT/ML ~~LOC~~ SOLN: 0.0000 [IU] | Freq: Every day | SUBCUTANEOUS | Status: DC

## 2017-11-23 MED ORDER — RIVAROXABAN 15 MG PO TABS
15.0000 mg | ORAL_TABLET | Freq: Every day | ORAL | Status: DC
  Administered 2017-11-23 – 2017-11-26 (×4): 15 mg via ORAL
  Filled 2017-11-23 (×4): qty 1

## 2017-11-23 SURGICAL SUPPLY — 8 items
CATH BALLN WEDGE 5F 110CM (CATHETERS) ×1 IMPLANT
PACK CARDIAC CATHETERIZATION (CUSTOM PROCEDURE TRAY) ×2 IMPLANT
PROTECTION STATION PRESSURIZED (MISCELLANEOUS) ×2
SHEATH GLIDE SLENDER 4/5FR (SHEATH) ×1 IMPLANT
STATION PROTECTION PRESSURIZED (MISCELLANEOUS) IMPLANT
TRANSDUCER W/STOPCOCK (MISCELLANEOUS) ×2 IMPLANT
TUBING ART PRESS 72  MALE/FEM (TUBING) ×1
TUBING ART PRESS 72 MALE/FEM (TUBING) IMPLANT

## 2017-11-23 NOTE — Progress Notes (Signed)
  Echocardiogram 2D Echocardiogram has been performed.  Jennette Dubin 11/23/2017, 2:15 PM

## 2017-11-23 NOTE — Progress Notes (Signed)
Pt gone to Echo, then likely to radiology. Will catch up medications upon return. Medications initially delayed due to conflicting labeling of medications from pharmacy and pt availability out of restroom.

## 2017-11-23 NOTE — H&P (Signed)
Advanced Heart Failure Team History and Physical Note   PCP:  Alroy Dust, L.Marlou Sa, MD  PCP-Cardiology: No primary care provider on file.     Reason for Admission: CHF exacerbation   HPI:    77 y.o.with history of CKD stage 3, paroxysmal atrial fibrillation, and chronic diastolic CHF was initially referred by Dr. Wynonia Lawman for evaluation of CHF.  RHC/LHC was done in 10/15.  This showed markedly elevated filling pressures and pulmonary venous hypertension.  There was no significant CAD.  Last echo in 2/18 showed EF 50-55%, mild LVH, grade 2 diastolic dysfunction. She is followed by Dr. Moshe Cipro for nephrology.   She has been struggling recently.  Generally fatigued with poor energy.  Chronic cough x 3 months.  No fever.  She no longer smokes.  She has trouble sleeping, uses 2 pillows.  Per her daughter, she has gotten to the point where she is short of breath just walking across the room at home.  Takes her a long time to do ADLs due to marked dyspnea. Symptoms have been slowly progressive, no sudden onset.  No chest pain. No lightheadedness.    RHC was done today as below. This showed significantly elevated PCWP and RAP as well as primarily pulmonary venous hypertension. I discussed the findings with the patient and her daughter.  She seems to be diuresing better after making the switch to torsemide but still significantly volume overloaded based on RHC. Given the severity of her symptoms, I think it will be safest and most efficacious to admit her for IV diuresis in the hospital.   RHC Procedural Findings: Hemodynamics (mmHg) RA mean 13 RV 68/14 PA 74/27, mean 50 PCWP mean 28 Oxygen saturations: PA 68% AO 97% Cardiac Output (Fick) 6.08  Cardiac Index (Fick) 3.12 PVR 3.6 WU  Review of Systems:  All systems reviewed and negative except as per HPI.   Home Medications Prior to Admission medications   Medication Sig Start Date End Date Taking? Authorizing Provider  albuterol  (PROVENTIL HFA;VENTOLIN HFA) 108 (90 BASE) MCG/ACT inhaler Inhale 2 puffs into the lungs every 4 (four) hours as needed for shortness of breath.    Yes [provider]  atorvastatin (LIPITOR) 80 MG tablet Take 80 mg by mouth daily.   Yes [provider]  calcitRIOL (ROCALTROL) 0.25 MCG capsule Take 0.25 mcg by mouth daily.   Yes [provider]  fluticasone (CUTIVATE) 0.005 % ointment Apply 1 application topically 2 (two) times daily. 10/29/17  Yes [provider]  guaiFENesin (MUCINEX) 600 MG 12 hr tablet Take 600 mg by mouth daily as needed for cough.   Yes [provider]  hydrALAZINE (APRESOLINE) 50 MG tablet Take 1.5 tablets (75 mg total) by mouth 2 (two) times daily. 11/17/17  Yes Larey Dresser, MD  insulin aspart (NOVOLOG) 100 UNIT/ML injection Inject 10 Units into the skin 2 (two) times daily before lunch and supper.    Yes [provider]  insulin degludec (TRESIBA FLEXTOUCH) 100 UNIT/ML SOPN FlexTouch Pen Inject 40 Units into the skin every morning.    Yes [provider]  isosorbide mononitrate (IMDUR) 30 MG 24 hr tablet Take 1 tablet (30 mg total) by mouth daily. 11/17/17  Yes Larey Dresser, MD  latanoprost (XALATAN) 0.005 % ophthalmic solution Place 1 drop into both eyes at bedtime.   Yes [provider]  metoprolol succinate (TOPROL-XL) 25 MG 24 hr tablet Take 25 mg by mouth 2 (two) times daily.   Yes  [provider]  NUCYNTA ER 50 MG 12 hr tablet Take 50 mg by mouth every 12 (twelve) hours. 11/07/17  Yes [provider]  repaglinide (PRANDIN) 0.5 MG tablet Take 0.5 mg by mouth 3 (three) times daily before meals.    Yes [provider]  Rivaroxaban (XARELTO) 15 MG TABS tablet Take 1 tablet (15 mg total) by mouth daily with supper. 04/10/16  Yes Ghimire, Henreitta Leber, MD  torsemide (DEMADEX) 20 MG tablet Take 80 mg (4 Tablets) in the AM and 40 mg (2 Tablets) in the PM Patient taking  differently: Take 40-80 mg by mouth See admin instructions. Take 80 mg (4 Tablets) in the AM and 40 mg (2 Tablets) in the PM 11/17/17  Yes Larey Dresser, MD  traMADol (ULTRAM) 50 MG tablet Take 50 mg by mouth every 6 (six) hours as needed for severe pain.    Yes [provider]  triamcinolone cream (KENALOG) 0.1 % Apply 1 application topically 2 (two) times daily.   Yes [provider]    Past Medical History: 1. Atrial fibrillation: Paroxysmal.  2. HTN 3. 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  5. CKD stage 3 6. H/o IVCD 7. COPD 8. Gout 9. Chronic primarily diastolic CHF: Echo (1/69) with EF 50-55%, mild LVH, grade 2 diastolic dysfunction.  - LHC/RHC (10/15): small distal LAD but no discrete stenosis.  Mean RA 15, PA 55/20 mean 34, mean PCWP 31 => pulmonary venous hypertension.  10. Sleep study 2016: Negative per patient.   Past Surgical History: Past Surgical History:  Procedure Laterality Date  . ABDOMINAL HYSTERECTOMY    . CHOLECYSTECTOMY    . LEFT AND RIGHT HEART CATHETERIZATION WITH CORONARY ANGIOGRAM N/A 11/29/2013   Procedure: LEFT AND RIGHT HEART CATHETERIZATION WITH CORONARY ANGIOGRAM;  Surgeon: Jacolyn Reedy, MD;  [facility] CATH LAB;  Service: Cardiovascular;  Laterality: N/A;    Family History:  Family History  Problem Relation Age of Onset  . Lupus Daughter   . Cancer Mother 51  . CVA Father 83  . Cancer Brother 49    Social History: Social History   Socioeconomic History  . Marital status: Widowed    Spouse name: Not on file  . Number of children: 2  . Years of education: Not on file  . Highest education level: Not on file  Occupational History  . Occupation: retired  Scientific laboratory technician  . Financial resource strain: Not on file  . Food insecurity:    Worry: Not on file    Inability: Not on file  . Transportation needs:    Medical: Not on file    Non-medical: Not on file  Tobacco Use  . Smoking status: Former  Smoker    Packs/day: 1.00    Years: 20.00    Pack years: 20.00    Types: Cigarettes    Last attempt to quit: 02/22/2009    Years since quitting: 8.7  . Smokeless tobacco: Never Used  Substance and Sexual Activity  . Alcohol use: No    Alcohol/week: 0.0 standard drinks    Comment: quit 2011  . Drug use: No  . Sexual activity: Never  Lifestyle  . Physical activity:    Days per week: Not on file    Minutes per session: Not on file  . Stress: Not on file  Relationships  . Social connections:    Talks on phone: Not on file    Gets together: Not on file  Attends religious service: Not on file    Active member of club or organization: Not on file    Attends meetings of clubs or organizations: Not on file    Relationship status: Not on file  Other Topics Concern  . Not on file  Social History Narrative  . Not on file    Allergies:  Allergies  Allergen Reactions  . Eggs Or Egg-Derived Products Nausea And Vomiting  . Lisinopril Nausea And Vomiting  . Penicillins Nausea And Vomiting    Has patient had a PCN reaction causing immediate rash, facial/tongue/throat swelling, SOB or lightheadedness with hypotension: No Has patient had a PCN reaction causing severe rash involving mucus membranes or skin necrosis: No Has patient had a PCN reaction that required hospitalization: No Has patient had a PCN reaction occurring within the last 10 years: No If all of the above answers are "NO", then may proceed with Cephalosporin use.     Objective:    Vital Signs:   Temp:  [97.9 F (36.6 C)] 97.9 F (36.6 C) (10/02 0812) Pulse Rate:  [76-101] 92 (10/02 1040) Resp:  [10-20] 16 (10/02 1040) BP: (94-175)/(76-90) 166/76 (10/02 1040) SpO2:  [95 %-100 %] 95 % (10/02 1040) Weight:  [95.3 kg] 95.3 kg (10/02 0812)   Filed Weights   11/23/17 0812  Weight: 95.3 kg     Physical Exam     General:  Well appearing. No respiratory difficulty HEENT: Normal Neck: Thick, JVP 14 cm. Carotids  2+ bilat; no bruits. No lymphadenopathy or thyromegaly appreciated. Cor: PMI nondisplaced. Regular rate & rhythm. No rubs, gallops or murmurs. Lungs: Clear Abdomen: Soft, nontender, nondistended. No hepatosplenomegaly. No bruits or masses. Good bowel sounds. Extremities: No cyanosis, clubbing, rash. 1+ edema to knees bilaterally.  Neuro: Alert & oriented x 3, cranial nerves grossly intact. moves all 4 extremities w/o difficulty. Affect pleasant.   Telemetry   NSR 80s (personally reviewed)  EKG   NSR, IVCD (personally reviewed)  Labs     Basic Metabolic Panel: Recent Labs  Lab 11/17/17 1248 11/23/17 0821  NA 145 141  K 4.4 3.8  CL 108 101  CO2 28 28  GLUCOSE 152* 96  BUN 45* 39*  CREATININE 2.45* 2.29*  CALCIUM 8.7* 8.3*    Liver Function Tests: No results for input(s): AST, ALT, ALKPHOS, BILITOT, PROT, ALBUMIN in the last 168 hours. No results for input(s): LIPASE, AMYLASE in the last 168 hours. No results for input(s): AMMONIA in the last 168 hours.  CBC: Recent Labs  Lab 11/23/17 0821  WBC 12.5*  HGB 10.8*  HCT 36.8  MCV 83.6  PLT 344    Cardiac Enzymes: No results for input(s): CKTOTAL, CKMB, CKMBINDEX, TROPONINI in the last 168 hours.  BNP: BNP (last 3 results) Recent Labs    11/17/17 1247  BNP 251.5*    ProBNP (last 3 results) No results for input(s): PROBNP in the last 8760 hours.   CBG: Recent Labs  Lab 11/23/17 0811  GLUCAP 96    Coagulation Studies: No results for input(s): LABPROT, INR in the last 72 hours.  Imaging:  No results found.    Assessment/Plan   1. Acute on chronic diastolic CHF: Last echo in 2/18 with EF 50-55%, mild LVH, grade 2 diastolic dysfunction.  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  [place] 11/23/17 showed significantly elevated right and left heart filling pressures and severe primarily pulmonary venous hypertension.  Cardiac output  preserved.  NYHA class IIIb symptoms, gradually worsening and complicated by CKD stage 3.  On exam, she remains volume overloaded.  Creatinine 2.29 today which appears to be around her baseline.  - Admit for IV diuresis.  Will start with Lasix 80 mg IV every 8 hrs.  - Repeat echo.  - Cardiac amyloidosis is a consideration here.  LVH and diastolic CHF could be related to HTN, but need to rule out amyloidosis.  Myeloma panel has been done and was negative.  She cannot get contrast with MRI due to elevated creatinine. Will arrange for PYP scan while inpatient.  2. COPD: She no longer smokes.  I will get PFTs when better diuresed to assess for contribution of lung disease to symptoms.   3. HTN: Continue home dose of hydralazine/Imdur and Toprol XL.  4. CKD: Stage 3.  Follow closely with diuresis. Creatinine at baseline today.  5. Atrial fibrillation: Paroxysmal.  She is in NSR today.  - She can restart on Xarelto 15 mg daily this evening.  6. Pulmonary hypertension: Severe pulmonary hypertension on RHC.  PVR 3.6 WU.  This appears to be primarily pulmonary venous hypertension though would be reasonable to repeat RHC after better diuresis.    Loralie Champagne, MD 11/23/2017, 11:03 AM  Advanced Heart Failure Team Pager [phone] (M-F; 7a - 4p)  Please contact [facility] for night-coverage after hours (4p -7a ) and weekends on [email or web address]

## 2017-11-24 ENCOUNTER — Inpatient Hospital Stay (HOSPITAL_COMMUNITY): Payer: PPO

## 2017-11-24 LAB — CBC
HCT: 35.5 % — ABNORMAL LOW (ref 36.0–46.0)
Hemoglobin: 10.5 g/dL — ABNORMAL LOW (ref 12.0–15.0)
MCH: 24.6 pg — ABNORMAL LOW (ref 26.0–34.0)
MCHC: 29.6 g/dL — ABNORMAL LOW (ref 30.0–36.0)
MCV: 83.3 fL (ref 78.0–100.0)
Platelets: 355 10*3/uL (ref 150–400)
RBC: 4.26 MIL/uL (ref 3.87–5.11)
RDW: 15.9 % — ABNORMAL HIGH (ref 11.5–15.5)
WBC: 11.8 10*3/uL — ABNORMAL HIGH (ref 4.0–10.5)

## 2017-11-24 LAB — POCT I-STAT 3, VENOUS BLOOD GAS (G3P V)
Acid-Base Excess: 2 mmol/L (ref 0.0–2.0)
Acid-Base Excess: 3 mmol/L — ABNORMAL HIGH (ref 0.0–2.0)
Bicarbonate: 28.1 mmol/L — ABNORMAL HIGH (ref 20.0–28.0)
Bicarbonate: 29.3 mmol/L — ABNORMAL HIGH (ref 20.0–28.0)
O2 Saturation: 67 %
O2 Saturation: 68 %
TCO2: 30 mmol/L (ref 22–32)
TCO2: 31 mmol/L (ref 22–32)
pCO2, Ven: 48.6 mmHg (ref 44.0–60.0)
pCO2, Ven: 51.7 mmHg (ref 44.0–60.0)
pH, Ven: 7.362 (ref 7.250–7.430)
pH, Ven: 7.37 (ref 7.250–7.430)
pO2, Ven: 37 mmHg (ref 32.0–45.0)
pO2, Ven: 37 mmHg (ref 32.0–45.0)

## 2017-11-24 LAB — GLUCOSE, CAPILLARY
Glucose-Capillary: 116 mg/dL — ABNORMAL HIGH (ref 70–99)
Glucose-Capillary: 150 mg/dL — ABNORMAL HIGH (ref 70–99)
Glucose-Capillary: 99 mg/dL (ref 70–99)
Glucose-Capillary: 161 mg/dL — ABNORMAL HIGH (ref 70–99)

## 2017-11-24 LAB — BASIC METABOLIC PANEL
Anion gap: 11 (ref 5–15)
BUN: 39 mg/dL — ABNORMAL HIGH (ref 8–23)
CO2: 25 mmol/L (ref 22–32)
Calcium: 8.4 mg/dL — ABNORMAL LOW (ref 8.9–10.3)
Chloride: 107 mmol/L (ref 98–111)
Creatinine, Ser: 2.19 mg/dL — ABNORMAL HIGH (ref 0.44–1.00)
GFR calc Af Amer: 24 mL/min — ABNORMAL LOW (ref >60)
GFR calc non Af Amer: 21 mL/min — ABNORMAL LOW (ref >60)
Glucose, Bld: 126 mg/dL — ABNORMAL HIGH (ref 70–99)
Potassium: 4.3 mmol/L (ref 3.5–5.1)
Sodium: 143 mmol/L (ref 135–145)

## 2017-11-24 LAB — TSH: TSH: 1.263 u[IU]/mL (ref 0.350–4.500)

## 2017-11-24 MED ORDER — TECHNETIUM TC 99M PYROPHOSPHATE
21.4000 | Freq: Once | INTRAVENOUS | Status: AC
  Administered 2017-11-24: 21.4 via INTRAVENOUS

## 2017-11-24 MED FILL — Heparin Sod (Porcine)-NaCl IV Soln 1000 Unit/500ML-0.9%: INTRAVENOUS | Qty: 500 | Status: AC

## 2017-11-24 NOTE — Progress Notes (Signed)
Patient ID: Stephanie Woodward, female   DOB: 03-Mar-1940, 77 y.o.   MRN: [id number]     Advanced Heart Failure Rounding Note  PCP-Cardiologist: No primary care provider on file.   Subjective:    Patient says she diuresed well overnight, not sure all UOP recorded.  Weight down 2 lbs.  Breathing better. Creatinine stable at 2.19.    CXR with CHF.   Echo: EF 45-50%, septal-lateral dyssynchrony, normal RV.   RHC Procedural Findings: Hemodynamics (mmHg) RA mean 13 RV 68/14 PA 74/27, mean 50 PCWP mean 28 Oxygen saturations: PA 68% AO 97% Cardiac Output (Fick) 6.08  Cardiac Index (Fick) 3.12 PVR 3.6 WU   Objective:   Weight Range: 94.5 kg Body mass index is 38.1 kg/m.   Vital Signs:   Temp:  [97.8 F (36.6 C)-99 F (37.2 C)] 97.8 F (36.6 C) (10/03 0300) Pulse Rate:  [56-101] 56 (10/03 0300) Resp:  [10-22] 20 (10/03 0300) BP: (94-175)/(50-90) 120/58 (10/03 0300) SpO2:  [95 %-100 %] 99 % (10/03 0300) Weight:  [94.5 kg-95.3 kg] 94.5 kg (10/03 0300)    Weight change: Filed Weights   11/23/17 0812 11/24/17 0300  Weight: 95.3 kg 94.5 kg    Intake/Output:   Intake/Output Summary (Last 24 hours) at 11/24/2017 0800 Last data filed at 11/24/2017 0600 Gross per 24 hour  Intake 960 ml  Output 1050 ml  Net -90 ml      Physical Exam    General:  Well appearing. No resp difficulty HEENT: Normal Neck: Supple. JVP 12-13. Carotids 2+ bilat; no bruits. No lymphadenopathy or thyromegaly appreciated. Cor: PMI nondisplaced. Regular rate & rhythm. No rubs, gallops or murmurs. Lungs: Clear Abdomen: Soft, nontender, nondistended. No hepatosplenomegaly. No bruits or masses. Good bowel sounds. Extremities: No cyanosis, clubbing, rash, edema Neuro: Alert & orientedx3, cranial nerves grossly intact. moves all 4 extremities w/o difficulty. Affect pleasant   Telemetry   NSR with PVCs (personally reviewed).  Labs    CBC Recent Labs    11/23/17 0821 11/24/17 0605  WBC 12.5*  11.8*  HGB 10.8* 10.5*  HCT 36.8 35.5*  MCV 83.6 83.3  PLT 344 725   Basic Metabolic Panel Recent Labs    11/23/17 0821 11/24/17 0605  NA 141 143  K 3.8 4.3  CL 101 107  CO2 28 25  GLUCOSE 96 126*  BUN 39* 39*  CREATININE 2.29* 2.19*  CALCIUM 8.3* 8.4*   Liver Function Tests No results for input(s): AST, ALT, ALKPHOS, BILITOT, PROT, ALBUMIN in the last 72 hours. No results for input(s): LIPASE, AMYLASE in the last 72 hours. Cardiac Enzymes No results for input(s): CKTOTAL, CKMB, CKMBINDEX, TROPONINI in the last 72 hours.  BNP: BNP (last 3 results) Recent Labs    11/17/17 1247 11/23/17 0821  BNP 251.5* 271.8*    ProBNP (last 3 results) No results for input(s): PROBNP in the last 8760 hours.   D-Dimer No results for input(s): DDIMER in the last 72 hours. Hemoglobin A1C No results for input(s): HGBA1C in the last 72 hours. Fasting Lipid Panel No results for input(s): CHOL, HDL, LDLCALC, TRIG, CHOLHDL, LDLDIRECT in the last 72 hours. Thyroid Function Tests No results for input(s): TSH, T4TOTAL, T3FREE, THYROIDAB in the last 72 hours.  Invalid input(s): FREET3  Other results:   Imaging    Dg Chest 2 View  Result Date: 11/23/2017 CLINICAL DATA:  Congestive heart failure. Weakness. Shortness of breath. EXAM: CHEST - 2 VIEW COMPARISON:  10/20/2017 FINDINGS: Chronic cardiomegaly. Chronic pulmonary venous  hypertension. Mild interstitial edema. No measurable effusion. No focal infiltrate or collapse. No acute bone finding. IMPRESSION: Consistent with congestive heart failure. Chronic cardiomegaly, venous hypertension and mild interstitial edema. Electronically Signed   By: Nelson Chimes M.D.   On: 11/23/2017 14:57      Medications:     Scheduled Medications: . atorvastatin  80 mg Oral Daily  . calcitRIOL  0.25 mcg Oral Daily  . furosemide  80 mg Intravenous Q8H  . hydrALAZINE  75 mg Oral BID  . insulin aspart  0-15 Units Subcutaneous TID WC  . insulin  aspart  0-5 Units Subcutaneous QHS  . insulin aspart  4 Units Subcutaneous TID WC  . insulin glargine  40 Units Subcutaneous Daily  . isosorbide mononitrate  30 mg Oral Daily  . latanoprost  1 drop Both Eyes QHS  . metoprolol succinate  25 mg Oral BID  . potassium chloride  40 mEq Oral BID  . repaglinide  0.5 mg Oral TID AC  . Rivaroxaban  15 mg Oral Q supper  . sodium chloride flush  3 mL Intravenous Q12H  . tapentadol  50 mg Oral Q12H  . triamcinolone cream  1 application Topical BID     Infusions: . sodium chloride       PRN Medications:  sodium chloride, acetaminophen, albuterol, guaiFENesin, ondansetron (ZOFRAN) IV, sodium chloride flush, traMADol   Assessment/Plan   1. Acute on chronic diastolic CHF: Echo in 4/66 with EF 50-55%, mild LVH, grade 2 diastolic dysfunction. 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 [place] 11/23/17 showed significantly elevated right and left heart filling pressures and severe primarily pulmonary venous hypertension.  Cardiac output preserved. Echo (10/19) with EF 45-50%, septal-lateral dyssynchrony, RV looks ok.  NYHA class IIIb symptoms at home, gradually worsening and complicated by CKD stage 3. She was admitted for diuresis, weight down 2 lbs overnight.  Creatinine stable at 2.19.  Breathing improving.  - Continue with Lasix 80 mg IV every 8 hrs.  - Cardiac amyloidosis is a consideration here. LVH and diastolic CHF could be related to HTN, but need to rule out amyloidosis. Myeloma panel has been done and was negative. She cannot get contrast with MRI due to elevated creatinine. Will arrange for PYP scan this admission.  2. COPD: She no longer smokes. I will get PFTs when better diuresed to assess for contribution of lung disease to symptoms.  3. HTN: Continue home dose of hydralazine/Imdur and Toprol XL.  4. CKD: Stage 3. Follow closely with diuresis. Creatinine at baseline today,  2.19.  5. Atrial fibrillation: Paroxysmal. She is in NSR today.  - Continue Xarelto.  6. Pulmonary hypertension: Severe pulmonary hypertension on RHC.  PVR 3.6 WU.  This appears to be primarily pulmonary venous hypertension though would be reasonable to repeat RHC after better diuresis.   Length of Stay: 1  Loralie Champagne, MD  11/24/2017, 8:00 AM  Advanced Heart Failure Team Pager [phone] (M-F; 7a - 4p)  Please contact [facility] for night-coverage after hours (4p -7a ) and weekends on [email or web address]

## 2017-11-24 NOTE — Progress Notes (Signed)
CARDIAC REHAB PHASE I   PRE:  Rate/Rhythm: 62 Afib  BP:  Sitting: 122/46      SaO2: 97 RA  MODE:  Ambulation: 150 ft   POST:  Rate/Rhythm: 64 Afib  BP:  Sitting: 140/80    SaO2: 96 RA   Pt ambulated 122ft in hallway, standby assist with front wheel walker, one sitting rest break. Pt c/o some DOE, pt guided through purse lipped breathing. Pt and daughter given HF booklet and low sodium diets. Reviewed importance of daily weights and monitoring sodium intake. Questions and concerns by pt and daughter addressed. Will continue to follow.  [id number] Rufina Falco, RN BSN 11/24/2017 3:03 PM

## 2017-11-24 NOTE — Plan of Care (Signed)
  Problem: Education: Goal: Knowledge of General Education information will improve Description: Including pain rating scale, medication(s)/side effects and non-pharmacologic comfort measures Outcome: Progressing   Problem: Health Behavior/Discharge Planning: Goal: Ability to manage health-related needs will improve Outcome: Progressing   Problem: Clinical Measurements: Goal: Ability to maintain clinical measurements within normal limits will improve Outcome: Progressing   Problem: Clinical Measurements: Goal: Diagnostic test results will improve Outcome: Progressing   Problem: Clinical Measurements: Goal: Respiratory complications will improve Outcome: Progressing   Problem: Nutrition: Goal: Adequate nutrition will be maintained Outcome: Progressing   

## 2017-11-24 NOTE — Progress Notes (Signed)
Patient off the floor for procedure.  Baylen Buckner, RN

## 2017-11-24 NOTE — Plan of Care (Signed)
  Problem: Clinical Measurements: Goal: Diagnostic test results will improve Outcome: Progressing   Problem: Clinical Measurements: Goal: Cardiovascular complication will be avoided Outcome: Progressing   Problem: Pain Managment: Goal: General experience of comfort will improve Outcome: Progressing

## 2017-11-25 ENCOUNTER — Inpatient Hospital Stay (HOSPITAL_COMMUNITY): Payer: PPO

## 2017-11-25 LAB — PULMONARY FUNCTION TEST
DL/VA % pred: 90 %
DL/VA: 4.12 ml/min/mmHg/L
DLCO cor % pred: 42 %
DLCO cor: 9.13 ml/min/mmHg
DLCO unc % pred: 37 %
DLCO unc: 8.05 ml/min/mmHg
FEF 25-75 Post: 1.04 L/sec
FEF 25-75 Pre: 1.13 L/sec
FEF2575-%Change-Post: -7 %
FEF2575-%Pred-Post: 75 %
FEF2575-%Pred-Pre: 82 %
FEV1-%Change-Post: -3 %
FEV1-%Pred-Post: 53 %
FEV1-%Pred-Pre: 54 %
FEV1-Post: 0.81 L
FEV1-Pre: 0.84 L
FEV1FVC-%Change-Post: 1 %
FEV1FVC-%Pred-Pre: 113 %
FEV6-%Change-Post: -5 %
FEV6-%Pred-Post: 48 %
FEV6-%Pred-Pre: 51 %
FEV6-Post: 0.92 L
FEV6-Pre: 0.97 L
FEV6FVC-%Pred-Post: 105 %
FEV6FVC-%Pred-Pre: 105 %
FVC-%Change-Post: -4 %
FVC-%Pred-Post: 46 %
FVC-%Pred-Pre: 48 %
FVC-Post: 0.93 L
FVC-Pre: 0.97 L
Post FEV1/FVC ratio: 87 %
Post FEV6/FVC ratio: 100 %
Pre FEV1/FVC ratio: 86 %
Pre FEV6/FVC Ratio: 100 %
RV % pred: 100 %
RV: 2.21 L
TLC % pred: 71 %
TLC: 3.37 L

## 2017-11-25 LAB — BASIC METABOLIC PANEL
Anion gap: 7 (ref 5–15)
BUN: 38 mg/dL — ABNORMAL HIGH (ref 8–23)
CO2: 27 mmol/L (ref 22–32)
Calcium: 8.6 mg/dL — ABNORMAL LOW (ref 8.9–10.3)
Chloride: 109 mmol/L (ref 98–111)
Creatinine, Ser: 2.23 mg/dL — ABNORMAL HIGH (ref 0.44–1.00)
GFR calc Af Amer: 24 mL/min — ABNORMAL LOW (ref >60)
GFR calc non Af Amer: 21 mL/min — ABNORMAL LOW (ref >60)
Glucose, Bld: 150 mg/dL — ABNORMAL HIGH (ref 70–99)
Potassium: 4.7 mmol/L (ref 3.5–5.1)
Sodium: 143 mmol/L (ref 135–145)

## 2017-11-25 LAB — GLUCOSE, CAPILLARY
Glucose-Capillary: 102 mg/dL — ABNORMAL HIGH (ref 70–99)
Glucose-Capillary: 129 mg/dL — ABNORMAL HIGH (ref 70–99)
Glucose-Capillary: 141 mg/dL — ABNORMAL HIGH (ref 70–99)
Glucose-Capillary: 205 mg/dL — ABNORMAL HIGH (ref 70–99)
Glucose-Capillary: 250 mg/dL — ABNORMAL HIGH (ref 70–99)

## 2017-11-25 LAB — CBC
HCT: 34.6 % — ABNORMAL LOW (ref 36.0–46.0)
Hemoglobin: 10.1 g/dL — ABNORMAL LOW (ref 12.0–15.0)
MCH: 24.3 pg — ABNORMAL LOW (ref 26.0–34.0)
MCHC: 29.2 g/dL — ABNORMAL LOW (ref 30.0–36.0)
MCV: 83.4 fL (ref 78.0–100.0)
Platelets: 363 10*3/uL (ref 150–400)
RBC: 4.15 MIL/uL (ref 3.87–5.11)
RDW: 16.1 % — ABNORMAL HIGH (ref 11.5–15.5)
WBC: 12.5 10*3/uL — ABNORMAL HIGH (ref 4.0–10.5)

## 2017-11-25 MED ORDER — ALBUTEROL SULFATE (2.5 MG/3ML) 0.083% IN NEBU
2.5000 mg | INHALATION_SOLUTION | Freq: Once | RESPIRATORY_TRACT | Status: AC
  Administered 2017-11-25: 2.5 mg via RESPIRATORY_TRACT

## 2017-11-25 NOTE — Progress Notes (Signed)
Brief Nutrition Follow-Up Note  Received page from [facility], who reports pt desire outpatient RD follow-up. This RD will enter referral for [facility]. Per RNCM, she will also provide number to outpatient center for pt daughter to follow-up.   Winford Hehn A. Jimmye Norman, RD, LDN, CDE Pager: [phone] After hours Pager: [phone]

## 2017-11-25 NOTE — Care Management Note (Signed)
Case Management Note  Patient Details  Name: Geniyah Eischeid MRN: [id number] Date of Birth: 10-19-1940  Subjective/Objective:    CHF, COPD, afib                Action/Plan: NCM spoke to pt and dtr at bedside. Offered choice for HH/list provided. Pt states she used [facility] in the past. Contacted [facility] with new referral. Pt has RW and 3n1 at home. Contacted Nutritionist and she will send outpt referral to follow up with [facility]. Provided pt's dtr with contact info for [facility].   Expected Discharge Date:                Expected Discharge Plan:  [place]  In-House Referral:  NA  Discharge planning Services  CM Consult  Post Acute Care Choice:  Home Health Choice offered to:  Patient  DME Arranged:  N/A DME Agency:  NA  HH Arranged:  RN, PT [place] Agency:  [facility]  Status of Service:  Completed, signed off  If discussed at [place] of Stay Meetings, dates discussed:    Additional Comments:  Erenest Rasher, RN 11/25/2017, 4:58 PM

## 2017-11-25 NOTE — Progress Notes (Addendum)
Patient ID: Stephanie Woodward, female   DOB: [birth year], 77 y.o.   MRN: [id number]     Advanced Heart Failure Rounding Note  PCP-Cardiologist: No primary care provider on file.   Subjective:    Yesterday diuresed with IV lasix. Negative >2 liters. Creatinine stable 2.2>2.2. Weight trending down from 210>207.6.   SOB walking to bathroom.   PYP scan negative for TTR amyloidosis.   Echo: EF 45-50%, septal-lateral dyssynchrony, normal RV.   RHC Procedural Findings: Hemodynamics (mmHg) RA mean 13 RV 68/14 PA 74/27, mean 50 PCWP mean 28 Oxygen saturations: PA 68% AO 97% Cardiac Output (Fick) 6.08  Cardiac Index (Fick) 3.12 PVR 3.6 WU   Objective:   Weight Range: 94.2 kg Body mass index is 37.97 kg/m.   Vital Signs:   Temp:  [97.9 F (36.6 C)-98.7 F (37.1 C)] 98.3 F (36.8 C) (10/04 0550) Pulse Rate:  [59-94] 59 (10/04 0550) Resp:  [18-20] 18 (10/04 0550) BP: (118-144)/(54-69) 144/67 (10/04 0550) SpO2:  [94 %-100 %] 95 % (10/04 0550) Weight:  [94.2 kg] 94.2 kg (10/04 0030) Last BM Date: 11/24/17  Weight change: Filed Weights   11/23/17 0812 11/24/17 0300 11/25/17 0030  Weight: 95.3 kg 94.5 kg 94.2 kg    Intake/Output:   Intake/Output Summary (Last 24 hours) at 11/25/2017 0809 Last data filed at 11/25/2017 0050 Gross per 24 hour  Intake 846 ml  Output 2650 ml  Net -1804 ml      Physical Exam    General:  Well appearing. No resp difficulty. Sitting in the chair  HEENT: normal Neck: supple. JVP ~12  Carotids 2+ bilat; no bruits. No lymphadenopathy or thryomegaly appreciated. Cor: PMI nondisplaced. Irregular rate & rhythm. No rubs, gallops or murmurs. Lungs: clear Abdomen: obse. soft, nontender, nondistended. No hepatosplenomegaly. No bruits or masses. Good bowel sounds. Extremities: no cyanosis, clubbing, rash, edema Neuro: alert & orientedx3, cranial nerves grossly intact. moves all 4 extremities w/o difficulty. Affect pleasant    Telemetry   ?NSR  with PACs (personally reviewed)  Labs    CBC Recent Labs    11/24/17 0605 11/25/17 0538  WBC 11.8* 12.5*  HGB 10.5* 10.1*  HCT 35.5* 34.6*  MCV 83.3 83.4  PLT 355 841   Basic Metabolic Panel Recent Labs    11/24/17 0605 11/25/17 0538  NA 143 143  K 4.3 4.7  CL 107 109  CO2 25 27  GLUCOSE 126* 150*  BUN 39* 38*  CREATININE 2.19* 2.23*  CALCIUM 8.4* 8.6*   Liver Function Tests No results for input(s): AST, ALT, ALKPHOS, BILITOT, PROT, ALBUMIN in the last 72 hours. No results for input(s): LIPASE, AMYLASE in the last 72 hours. Cardiac Enzymes No results for input(s): CKTOTAL, CKMB, CKMBINDEX, TROPONINI in the last 72 hours.  BNP: BNP (last 3 results) Recent Labs    11/17/17 1247 11/23/17 0821  BNP 251.5* 271.8*    ProBNP (last 3 results) No results for input(s): PROBNP in the last 8760 hours.   D-Dimer No results for input(s): DDIMER in the last 72 hours. Hemoglobin A1C No results for input(s): HGBA1C in the last 72 hours. Fasting Lipid Panel No results for input(s): CHOL, HDL, LDLCALC, TRIG, CHOLHDL, LDLDIRECT in the last 72 hours. Thyroid Function Tests Recent Labs    11/24/17 0605  TSH 1.263    Other results:   Imaging    Nm Tumor Localization W Spect  Result Date: 11/24/2017 CLINICAL DATA:  HEART FAILURE. CONCERN FOR CARDIAC AMYLOIDOSIS. 77 year old female. EXAM: NUCLEAR MEDICINE  TUMOR LOCALIZATION. PYP CARDIAC AMYLOIDOSIS SCAN WITH SPECT TECHNIQUE: Following intravenous administration of radiopharmaceutical, anterior planar images of the chest were obtained. Regions of interest were placed on the heart and contralateral chest wall for quantitative assessment. Additional SPECT imaging of the chest was obtained. RADIOPHARMACEUTICALS:  21.4 mCi TECHNETIUM 99 PYROPHOSPHATE FINDINGS: Planar Visual assessment: Anterior planar imaging demonstrates radiotracer uptake within the heart less than or equal to uptake within the adjacent ribs (Grade 1).  Quantitative assessment : Quantitative assessment of the cardiac uptake compared to the contralateral chest wall is equal to 1.2 (H/CL = 1.2). SPECT assessment: SPECT imaging of the chest demonstrates minimal radiotracer accumulation within the LEFT ventricle. IMPRESSION: Visual and quantitative assessment (grade 1, H/CLL equal 1.2) are NOT suggestive of transthyretin amyloidosis. Electronically Signed   By: Suzy Bouchard M.D.   On: 11/24/2017 14:32     Medications:     Scheduled Medications: . atorvastatin  80 mg Oral Daily  . calcitRIOL  0.25 mcg Oral Daily  . furosemide  80 mg Intravenous Q8H  . hydrALAZINE  75 mg Oral BID  . insulin aspart  0-15 Units Subcutaneous TID WC  . insulin aspart  0-5 Units Subcutaneous QHS  . insulin aspart  4 Units Subcutaneous TID WC  . insulin glargine  40 Units Subcutaneous Daily  . isosorbide mononitrate  30 mg Oral Daily  . latanoprost  1 drop Both Eyes QHS  . metoprolol succinate  25 mg Oral BID  . potassium chloride  40 mEq Oral BID  . repaglinide  0.5 mg Oral TID AC  . Rivaroxaban  15 mg Oral Q supper  . sodium chloride flush  3 mL Intravenous Q12H  . tapentadol  50 mg Oral Q12H  . triamcinolone cream  1 application Topical BID    Infusions: . sodium chloride      PRN Medications: sodium chloride, acetaminophen, albuterol, guaiFENesin, ondansetron (ZOFRAN) IV, sodium chloride flush, traMADol   Assessment/Plan   1. Acute on chronic diastolic CHF: Echo in 3/78 with EF 50-55%, mild LVH, grade 2 diastolic dysfunction. 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 [place] 11/23/17 showed significantly elevated right and left heart filling pressures and severe primarily pulmonary venous hypertension.  Cardiac output preserved. Echo (10/19) with EF 45-50%, septal-lateral dyssynchrony, RV looks ok.  NYHA class IIIb symptoms at home, gradually worsening and complicated by CKD stage 3. She  was admitted for diuresis. Weight down another pound.  Creatinine 2.2>2.2  - Continue with Lasix 80 mg IV every 8 hrs today. Anticipate resuming torsemide 80 mg /40 mg once diuresed.  - Cardiac amyloidosis is a consideration here. LVH and diastolic CHF could be related to HTN, but need to rule out amyloidosis. Myeloma panel has been done and was negative. She cannot get contrast with MRI due to elevated creatinine.  PYP scan negative for TTR amyloidosis.   2. COPD: She no longer smokes.  Set up PFTs now that she has been diuresed.  3. HTN: Continue home dose of hydralazine/Imdur and Toprol XL.  4. CKD: Stage 3. Follow closely with diuresis. Creatinine at baseline today, 2.2 .  5. Atrial fibrillation: Obtain EKG. Rate controlled. Suspect NSR with PACs (like yesterday).   - Continue Xarelto.  6. Pulmonary hypertension: Severe pulmonary hypertension on RHC.  PVR 3.6 WU.  This appears to be primarily pulmonary venous hypertension though would be reasonable to repeat RHC after better diuresis.   Set HHPT/HHTN.  Continue cardiac rehab.  Consult dietitian.   Length of Stay: 2  Darrick Grinder, NP  11/25/2017, 8:09 AM  Advanced Heart Failure Team Pager [phone] (M-F; 7a - 4p)  Please contact [facility] for night-coverage after hours (4p -7a ) and weekends on [email or web address]  Patient seen with NP, agree with the above note.  She continues to diurese well with IV Lasix.  Weight down.  Still volume overloaded on exam.  - Continue current IV lasix.   Suspect NSR with sinus arrhythmia but will get ECG to confirm not atrial fibrillation.   Will arrange for PFTs today now that she has been diuresed.   PYP scan not suggestive of TTR amyloidosis.   Possibly home tomorrow, will reassess in am.   Loralie Champagne 11/25/2017 8:37 AM

## 2017-11-25 NOTE — Progress Notes (Signed)
EKG completed, placed on the patient chart.  Kaitlinn Iversen, RN

## 2017-11-25 NOTE — Plan of Care (Signed)
Nutrition Education Note  RD consulted for nutrition education regarding CHF and diabetes.  Lab Results  Component Value Date   HGBA1C 8.8 (H) 03/10/2016   PTA DM medications are 40 units insulin degludec daily and 10 units insulin aspart BID.   Labs reviewed: CBGS: 99-161 (inpatient orders for glycemic control are 40 units insulin glargine daily, 4 units insulin aspart TID with meals, 0-14 units insulin aspart TID with meals, and 0-5 units insulin aspart q HS).    Spoke with pt, who was in good spirits and sitting in recliner chair, snacking on graham crackers at time of visit. She reports good appetite presently and PTA. She generally consumes 3 meals per day (Breakfast: [place] bacon, applesauce, fruit, and toast, Lunch: and Dinner: grilled chicken, green beans, vegetables, and fruit). Commonly consumed beverages are water, low calorie drink mixes, and coffee with splenda.   Pt reports that she currently lives on the second floor in her daughters house, but often in unable to access the kitchen on the first floor, due to fatigue from using stairs. She has access to a refrigerator and microwave upstairs. She uses online grocery delivery service for her groceries and is very savvy about her food selections. She reports that she will likely be moving to her own apartment in November and is excited to have better access to cooking facilities so she can be better compliant with her diet.   Pt shares she she still experiences episodes of hypoglycemia- she has access to glucose tablet and consumes an HS snack at night. Her insulin was readjusted by her PCP but still experiences some lows, but this has improved. She also reports that she was recently prescribed "a little white pill that starts with an R" for DM, but she is unsure what it is called. RD unsure of what this medication is. Reviewed signs and symptoms of hypoglycemia as well as prevention.   Also spent time with pt discussing commonly found  hidden sources of sodium and how to decrease sodium in her diet. Pt comfortable with food label reading. She was very appreciative of RD visit. RD spent approximately 25 minutes with this pt obtaining history and providing education.   Case discussed with RN.  RD provided "Heart Healthy, Consistent Carbohydrate Nutrition Therapy" handout from the [employer]. Reviewed patient's dietary recall. Provided examples on ways to decrease sodium intake in diet. Discouraged intake of processed foods and use of salt shaker. Encouraged fresh fruits and vegetables as well as whole grain sources of carbohydrates to maximize fiber intake.   RD discussed why it is important for patient to adhere to diet recommendations, and emphasized the role of fluids, foods to avoid, and importance of weighing self daily.   Discussed different food groups and their effects on blood sugar, emphasizing carbohydrate-containing foods. Provided list of carbohydrates and recommended serving sizes of common foods.  Discussed importance of controlled and consistent carbohydrate intake throughout the day. Provided examples of ways to balance meals/snacks and encouraged intake of high-fiber, whole grain complex carbohydrates. Teach back method used.  Expect faire to good compliance.  Body mass index is 37.97 kg/m. Pt meets criteria for obesity, class II based on current BMI.  Current diet order is 2 gram sodium with 1800 ml fluid restriction, patient is consuming approximately 50-100% of meals at this time. Labs and medications reviewed. No further nutrition interventions warranted at this time. RD contact information provided. If additional nutrition issues arise, please re-consult RD.  Stephanie Woodward, RD, LDN, CDE Pager: [phone] After hours Pager: [phone]

## 2017-11-25 NOTE — Progress Notes (Signed)
CARDIAC REHAB PHASE I   Went to check on pt, pt out of room. Left Pulmonary Rehab brochure and note for pt. Will have someone follow-up with her tomorrow as time allows.  Rufina Falco, RN BSN 11/25/2017 1:57 PM

## 2017-11-26 ENCOUNTER — Inpatient Hospital Stay (HOSPITAL_COMMUNITY): Payer: PPO

## 2017-11-26 LAB — GLUCOSE, CAPILLARY
Glucose-Capillary: 106 mg/dL — ABNORMAL HIGH (ref 70–99)
Glucose-Capillary: 56 mg/dL — ABNORMAL LOW (ref 70–99)
Glucose-Capillary: 227 mg/dL — ABNORMAL HIGH (ref 70–99)
Glucose-Capillary: 262 mg/dL — ABNORMAL HIGH (ref 70–99)
Glucose-Capillary: 168 mg/dL — ABNORMAL HIGH (ref 70–99)
Glucose-Capillary: 126 mg/dL — ABNORMAL HIGH (ref 70–99)

## 2017-11-26 LAB — BASIC METABOLIC PANEL
Anion gap: 11 (ref 5–15)
BUN: 38 mg/dL — ABNORMAL HIGH (ref 8–23)
CO2: 25 mmol/L (ref 22–32)
Calcium: 9 mg/dL (ref 8.9–10.3)
Chloride: 107 mmol/L (ref 98–111)
Creatinine, Ser: 2.4 mg/dL — ABNORMAL HIGH (ref 0.44–1.00)
GFR calc Af Amer: 22 mL/min — ABNORMAL LOW (ref >60)
GFR calc non Af Amer: 19 mL/min — ABNORMAL LOW (ref >60)
Glucose, Bld: 109 mg/dL — ABNORMAL HIGH (ref 70–99)
Potassium: 4.8 mmol/L (ref 3.5–5.1)
Sodium: 143 mmol/L (ref 135–145)

## 2017-11-26 LAB — CBC
HCT: 38.3 % (ref 36.0–46.0)
Hemoglobin: 10.9 g/dL — ABNORMAL LOW (ref 12.0–15.0)
MCH: 24.2 pg — ABNORMAL LOW (ref 26.0–34.0)
MCHC: 28.5 g/dL — ABNORMAL LOW (ref 30.0–36.0)
MCV: 85.1 fL (ref 78.0–100.0)
Platelets: 383 10*3/uL (ref 150–400)
RBC: 4.5 MIL/uL (ref 3.87–5.11)
RDW: 16.1 % — ABNORMAL HIGH (ref 11.5–15.5)
WBC: 12.4 10*3/uL — ABNORMAL HIGH (ref 4.0–10.5)

## 2017-11-26 MED ORDER — TORSEMIDE 20 MG PO TABS
80.0000 mg | ORAL_TABLET | Freq: Every day | ORAL | Status: DC
  Administered 2017-11-27: 80 mg via ORAL
  Filled 2017-11-26: qty 4

## 2017-11-26 MED ORDER — TORSEMIDE 20 MG PO TABS: 40.0000 mg | ORAL_TABLET | Freq: Every evening | ORAL | Status: DC

## 2017-11-26 MED ORDER — FUROSEMIDE 10 MG/ML IJ SOLN
80.0000 mg | Freq: Three times a day (TID) | INTRAMUSCULAR | Status: AC
  Administered 2017-11-26 (×2): 80 mg via INTRAVENOUS
  Filled 2017-11-26 (×2): qty 8

## 2017-11-26 NOTE — Progress Notes (Signed)
Patient ID: Stephanie Woodward, female   DOB: 04/06/1940, 77 y.o.   MRN: [id number]     Advanced Heart Failure Rounding Note  PCP-Cardiologist: No primary care provider on file.   Subjective:    Good diuresis with IV Lasix again, she was not weighed standing today.  Breathing gradually getting better but still short of breath walking down hall.  Creatinine up a bit to 2.4.   PYP scan negative for TTR amyloidosis.   Echo: EF 45-50%, septal-lateral dyssynchrony, normal RV.   RHC Procedural Findings: Hemodynamics (mmHg) RA mean 13 RV 68/14 PA 74/27, mean 50 PCWP mean 28 Oxygen saturations: PA 68% AO 97% Cardiac Output (Fick) 6.08  Cardiac Index (Fick) 3.12 PVR 3.6 WU  PFTs: Severe restriction, concern for interstitial process.    Objective:   Weight Range: 95.1 kg Body mass index is 38.35 kg/m.   Vital Signs:   Temp:  [97.8 F (36.6 C)-98.2 F (36.8 C)] 97.8 F (36.6 C) (10/05 0529) Pulse Rate:  [58-68] 64 (10/05 0529) Resp:  [16-18] 16 (10/05 0529) BP: (122-130)/(48-62) 130/55 (10/05 0529) SpO2:  [95 %-98 %] 98 % (10/05 0529) Weight:  [95.1 kg] 95.1 kg (10/05 0529) Last BM Date: 11/25/17  Weight change: Filed Weights   11/24/17 0300 11/25/17 0030 11/26/17 0529  Weight: 94.5 kg 94.2 kg 95.1 kg    Intake/Output:   Intake/Output Summary (Last 24 hours) at 11/26/2017 1146 Last data filed at 11/26/2017 0900 Gross per 24 hour  Intake 803 ml  Output 2450 ml  Net -1647 ml      Physical Exam    General: NAD Neck: Thick, JVP 9-10 cm, no thyromegaly or thyroid nodule.  Lungs: Decreased BS throughout.  CV: Nondisplaced PMI.  Heart regular S1/S2, no S3/S4, no murmur. 1+ ankle edema.   Abdomen: Soft, nontender, no hepatosplenomegaly, no distention.  Skin: Intact without lesions or rashes.  Neurologic: Alert and oriented x 3.  Psych: Normal affect. Extremities: No clubbing or cyanosis.  HEENT: Normal.    Telemetry   NSR in 70s (personally reviewed)  Labs    CBC Recent Labs    11/25/17 0538 11/26/17 0619  WBC 12.5* 12.4*  HGB 10.1* 10.9*  HCT 34.6* 38.3  MCV 83.4 85.1  PLT 363 425   Basic Metabolic Panel Recent Labs    11/25/17 0538 11/26/17 0619  NA 143 143  K 4.7 4.8  CL 109 107  CO2 27 25  GLUCOSE 150* 109*  BUN 38* 38*  CREATININE 2.23* 2.40*  CALCIUM 8.6* 9.0   Liver Function Tests No results for input(s): AST, ALT, ALKPHOS, BILITOT, PROT, ALBUMIN in the last 72 hours. No results for input(s): LIPASE, AMYLASE in the last 72 hours. Cardiac Enzymes No results for input(s): CKTOTAL, CKMB, CKMBINDEX, TROPONINI in the last 72 hours.  BNP: BNP (last 3 results) Recent Labs    11/17/17 1247 11/23/17 0821  BNP 251.5* 271.8*    ProBNP (last 3 results) No results for input(s): PROBNP in the last 8760 hours.   D-Dimer No results for input(s): DDIMER in the last 72 hours. Hemoglobin A1C No results for input(s): HGBA1C in the last 72 hours. Fasting Lipid Panel No results for input(s): CHOL, HDL, LDLCALC, TRIG, CHOLHDL, LDLDIRECT in the last 72 hours. Thyroid Function Tests Recent Labs    11/24/17 0605  TSH 1.263    Other results:   Imaging    No results found.   Medications:     Scheduled Medications: . atorvastatin  80 mg  Oral Daily  . calcitRIOL  0.25 mcg Oral Daily  . furosemide  80 mg Intravenous Q8H  . hydrALAZINE  75 mg Oral BID  . insulin aspart  0-15 Units Subcutaneous TID WC  . insulin aspart  0-5 Units Subcutaneous QHS  . insulin aspart  4 Units Subcutaneous TID WC  . insulin glargine  40 Units Subcutaneous Daily  . isosorbide mononitrate  30 mg Oral Daily  . latanoprost  1 drop Both Eyes QHS  . metoprolol succinate  25 mg Oral BID  . potassium chloride  40 mEq Oral BID  . repaglinide  0.5 mg Oral TID AC  . Rivaroxaban  15 mg Oral Q supper  . sodium chloride flush  3 mL Intravenous Q12H  . tapentadol  50 mg Oral Q12H  . [START ON 11/27/2017] torsemide  40 mg Oral QPM  . [START ON  11/27/2017] torsemide  80 mg Oral QPC breakfast  . triamcinolone cream  1 application Topical BID    Infusions: . sodium chloride      PRN Medications: sodium chloride, acetaminophen, albuterol, guaiFENesin, ondansetron (ZOFRAN) IV, sodium chloride flush, traMADol   Assessment/Plan   1. Acute on chronic diastolic CHF: Echo in 4/31 with EF 50-55%, mild LVH, grade 2 diastolic dysfunction. 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 [place] 11/23/17 showed significantly elevated right and left heart filling pressures and severe primarily pulmonary venous hypertension.  Cardiac output preserved. Echo (10/19) with EF 45-50%, septal-lateral dyssynchrony, RV looks ok.  NYHA class IIIb symptoms at home, gradually worsening and complicated by CKD stage 3. She was admitted for diuresis, diuresing well with IV Lasix.  Still volume overloaded on exam but improved, symptoms getting better. Creatinine 2.2>2.2>2.4.  - One more day IV Lasix today, resume torsemide 80 mg /40 mg tomorrow.  - Cardiac amyloidosis was a consideration. LVH and diastolic CHF could be related to HTN, but need to rule out amyloidosis. Myeloma panel has been done and was negative. She cannot get contrast with MRI due to elevated creatinine.  PYP scan negative for TTR amyloidosis.  Doubt cardiac amyloidosis.  2. COPD: She no longer smokes.  PFTs actually were markedly restrictive suggestive of an interstitial process.   3. HTN: Continue home dose of hydralazine/Imdur and Toprol XL.  4. CKD: Stage 3. Follow closely with diuresis. Creatinine mildly higher today at 2.4.   5. Atrial fibrillation: Paroxysmal.  She is in NSR.   - Continue Xarelto.  6. Pulmonary hypertension: Severe pulmonary hypertension on RHC.  PVR 3.6 WU.  This appears to be primarily pulmonary venous hypertension though would be reasonable to repeat RHC after better diuresis.  7. Restrictive PFTs: May be due  in part to pulmonary edema but has now been diuresed.  Will get a high resolution CT chest to assess for pulmonary fibrosis.   Anticipate home tomorrow, followup with me in CHF clinic.   Length of Stay: 3  Loralie Champagne, MD  11/26/2017, 11:46 AM  Advanced Heart Failure Team Pager [phone] (M-F; 7a - 4p)  Please contact [facility] for night-coverage after hours (4p -7a ) and weekends on [email or web address]

## 2017-11-26 NOTE — Progress Notes (Signed)
CARDIAC REHAB PHASE I   PRE:  Rate/Rhythm: 70 SR   BP:  Sitting: 120/60      SaO2: 95% RA   MODE:  Ambulation: 130 ft Peak HR: 88   POST:  Rate/Rhythm: 62 SR   BP:  Sitting: 140/80      SaO2: 96% RA  Pt in recliner. Pt asked about Pulmonary Rehab. Spoke with pt and daughter (on the phone) about pulmonary rehab. Pt states she is very interested. Will follow up about pt participating in pulmonary rehab and send referral if possible. Pt ambulated 130 ft with walker. Pt had very slow gait. Pt had 5/10 back pain at beginning of walk. Pt took seated rest break at halfway of walk. Back pain ended at 7/10. Pt returned to recliner. Pt reported mild SOB +1 once walk was complete. Call bell within reach.   Carma Lair MS, ACSM CEP  12:28 PM 11/26/2017

## 2017-11-26 NOTE — Discharge Instructions (Signed)

## 2017-11-26 NOTE — Progress Notes (Signed)
Patient's  CBG 56, patient asymptomatic. Alert and oriented, eating lunch at this time, verbalized "I feel fine". Will monitor.

## 2017-11-27 ENCOUNTER — Other Ambulatory Visit: Payer: Self-pay | Admitting: Student

## 2017-11-27 ENCOUNTER — Inpatient Hospital Stay (HOSPITAL_COMMUNITY): Payer: PPO

## 2017-11-27 DIAGNOSIS — I5021 Acute systolic (congestive) heart failure: Secondary | ICD-10-CM

## 2017-11-27 DIAGNOSIS — N1330 Unspecified hydronephrosis: Secondary | ICD-10-CM

## 2017-11-27 LAB — BASIC METABOLIC PANEL
Anion gap: 8 (ref 5–15)
BUN: 42 mg/dL — ABNORMAL HIGH (ref 8–23)
CO2: 25 mmol/L (ref 22–32)
Calcium: 8.8 mg/dL — ABNORMAL LOW (ref 8.9–10.3)
Chloride: 107 mmol/L (ref 98–111)
Creatinine, Ser: 2.52 mg/dL — ABNORMAL HIGH (ref 0.44–1.00)
GFR calc Af Amer: 21 mL/min — ABNORMAL LOW (ref >60)
GFR calc non Af Amer: 18 mL/min — ABNORMAL LOW (ref >60)
Glucose, Bld: 104 mg/dL — ABNORMAL HIGH (ref 70–99)
Potassium: 5.1 mmol/L (ref 3.5–5.1)
Sodium: 140 mmol/L (ref 135–145)

## 2017-11-27 LAB — CBC
HCT: 36.3 % (ref 36.0–46.0)
Hemoglobin: 10.5 g/dL — ABNORMAL LOW (ref 12.0–15.0)
MCH: 24.3 pg — ABNORMAL LOW (ref 26.0–34.0)
MCHC: 28.9 g/dL — ABNORMAL LOW (ref 30.0–36.0)
MCV: 84 fL (ref 78.0–100.0)
Platelets: 380 10*3/uL (ref 150–400)
RBC: 4.32 MIL/uL (ref 3.87–5.11)
RDW: 15.8 % — ABNORMAL HIGH (ref 11.5–15.5)
WBC: 12.3 10*3/uL — ABNORMAL HIGH (ref 4.0–10.5)

## 2017-11-27 LAB — GLUCOSE, CAPILLARY
Glucose-Capillary: 177 mg/dL — ABNORMAL HIGH (ref 70–99)
Glucose-Capillary: 107 mg/dL — ABNORMAL HIGH (ref 70–99)
Glucose-Capillary: 157 mg/dL — ABNORMAL HIGH (ref 70–99)

## 2017-11-27 MED ORDER — HYDRALAZINE HCL 50 MG PO TABS: 75.0000 mg | ORAL_TABLET | Freq: Two times a day (BID) | ORAL | 3 refills | Status: DC

## 2017-11-27 MED ORDER — POTASSIUM CHLORIDE CRYS ER 20 MEQ PO TBCR: 40.0000 meq | EXTENDED_RELEASE_TABLET | Freq: Every day | ORAL | 5 refills | Status: DC

## 2017-11-27 MED ORDER — TORSEMIDE 20 MG PO TABS: 40.0000 mg | ORAL_TABLET | ORAL | Status: DC

## 2017-11-27 NOTE — Discharge Summary (Addendum)
Discharge Summary    Patient ID: Stephanie Woodward,  MRN: [id number], DOB/AGE: [birth year] 77 y.o.  Admit date: 11/23/2017 Discharge date: 11/27/2017  Primary Care Provider: Alroy Dust, L.Spink Primary Cardiologist: Dr. Wynonia Lawman CHF: Dr. Aundra Dubin   Discharge Diagnoses    Principal Problem:   Acute on chronic combined systolic and diastolic CHF (congestive heart failure) ([place]) Active Problems:   Hypertensive heart disease   Kidney disease, chronic, stage III (GFR 30-59 ml/min) ([place])   Pulmonary hypertension ([place])   History of Present Illness     Stephanie Woodward is a 77 y.o. female with past medical history of paroxysmal atrial fibrillation, chronic combined systolic and diastolic CHF, pulmonary HTN, HTN, COPD, and Stage 3 CKD who was directly admitted from [facility] to [facility] for IV diuresis.   The patient reported worsening dyspnea and fatigue over the past several weeks. Denied any chest pain or dyspnea. RHC was performed and showed elevated PCWP and RAP and it was recommended the patient be admitted for IV diuresis. Was therefore started on IV Lasix 80mg  TID at the time of admission.   Hospital Course     Consultants: None  An echocardiogram was performed on admission and showed EF was slightly reduced to 45-50% with septal-lateral dyssynchrony with mild diffuse hypokinesis. RV size was normal. A PCP scan was performed and negative for TTR amyloidosis.   PFT's were performed and demonstrated a restrictive physiology, therefore Chest CT was performed on 11/26/2017. This showed no convincing findings of interstitial lung disease. Was noted to have mild patchy subpleural reticulation without significant bronchiectasis or honeycombing. Was continued on IV Lasix throughout admission and on 11/27/2017, she was felt to be close to her baseline weight. Creatinine had trended upwards from 2.2 on admission to 2.52 at the time of discharge with weight 208 lbs at that time.   Was last examined by Dr.  Haroldine Laws and deemed stable for discharge. A renal US has been ordered to assess for hydronephrosis but can be obtained as an outpatient if not performed prior to discharge. She will be discharged on Torsemide 80mg  in AM/40mg  in PM with close follow-up scheduled in the [facility] on 12/07/2017. Will need a repeat BMET at the time of her visit. Was discharged home in stable condition.   _____________  Discharge Vitals Blood pressure (!) 111/43, pulse 63, temperature (!) 97.3 F (36.3 C), temperature source Oral, resp. rate 18, height 5\' 2"  (1.575 m), weight 94.4 kg, SpO2 98 %.  Filed Weights   11/25/17 0030 11/26/17 0529 11/27/17 0523  Weight: 94.2 kg 95.1 kg 94.4 kg    Labs & Radiologic Studies     CBC Recent Labs    11/26/17 0619 11/27/17 0647  WBC 12.4* 12.3*  HGB 10.9* 10.5*  HCT 38.3 36.3  MCV 85.1 84.0  PLT 383 409   Basic Metabolic Panel Recent Labs    11/26/17 0619 11/27/17 0647  NA 143 140  K 4.8 5.1  CL 107 107  CO2 25 25  GLUCOSE 109* 104*  BUN 38* 42*  CREATININE 2.40* 2.52*  CALCIUM 9.0 8.8*   Liver Function Tests No results for input(s): AST, ALT, ALKPHOS, BILITOT, PROT, ALBUMIN in the last 72 hours. No results for input(s): LIPASE, AMYLASE in the last 72 hours. Cardiac Enzymes No results for input(s): CKTOTAL, CKMB, CKMBINDEX, TROPONINI in the last 72 hours. BNP Invalid input(s): POCBNP D-Dimer No results for input(s): DDIMER in the last 72 hours. Hemoglobin A1C No results for  input(s): HGBA1C in the last 72 hours. Fasting Lipid Panel No results for input(s): CHOL, HDL, LDLCALC, TRIG, CHOLHDL, LDLDIRECT in the last 72 hours. Thyroid Function Tests No results for input(s): TSH, T4TOTAL, T3FREE, THYROIDAB in the last 72 hours.  Invalid input(s): FREET3  Dg Chest 2 View  Result Date: 11/23/2017 CLINICAL DATA:  Congestive heart failure. Weakness. Shortness of breath. EXAM: CHEST - 2 VIEW COMPARISON:  10/20/2017 FINDINGS:  Chronic cardiomegaly. Chronic pulmonary venous hypertension. Mild interstitial edema. No measurable effusion. No focal infiltrate or collapse. No acute bone finding. IMPRESSION: Consistent with congestive heart failure. Chronic cardiomegaly, venous hypertension and mild interstitial edema. Electronically Signed   By: Nelson Chimes M.D.   On: 11/23/2017 14:57   Nm Tumor Localization W Spect  Result Date: 11/24/2017 CLINICAL DATA:  HEART FAILURE. CONCERN FOR CARDIAC AMYLOIDOSIS. 77 year old female. EXAM: NUCLEAR MEDICINE TUMOR LOCALIZATION. PYP CARDIAC AMYLOIDOSIS SCAN WITH SPECT TECHNIQUE: Following intravenous administration of radiopharmaceutical, anterior planar images of the chest were obtained. Regions of interest were placed on the heart and contralateral chest wall for quantitative assessment. Additional SPECT imaging of the chest was obtained. RADIOPHARMACEUTICALS:  21.4 mCi TECHNETIUM 99 PYROPHOSPHATE FINDINGS: Planar Visual assessment: Anterior planar imaging demonstrates radiotracer uptake within the heart less than or equal to uptake within the adjacent ribs (Grade 1). Quantitative assessment : Quantitative assessment of the cardiac uptake compared to the contralateral chest wall is equal to 1.2 (H/CL = 1.2). SPECT assessment: SPECT imaging of the chest demonstrates minimal radiotracer accumulation within the LEFT ventricle. IMPRESSION: Visual and quantitative assessment (grade 1, H/CLL equal 1.2) are NOT suggestive of transthyretin amyloidosis. Electronically Signed   By: Suzy Bouchard M.D.   On: 11/24/2017 14:32   Ct Chest High Resolution  Result Date: 11/27/2017 CLINICAL DATA:  Inpatient.  Restrictive PFTs.  History of CHF. EXAM: CT CHEST WITHOUT CONTRAST TECHNIQUE: Multidetector CT imaging of the chest was performed following the standard protocol without intravenous contrast. High resolution imaging of the lungs, as well as inspiratory and expiratory imaging, was performed. COMPARISON:   11/23/2017 chest radiograph. FINDINGS: Cardiovascular: Mild cardiomegaly. No significant pericardial effusion/thickening. Left circumflex coronary atherosclerosis. Atherosclerotic nonaneurysmal thoracic aorta. Top-normal main pulmonary artery (3.1 cm diameter). Mediastinum/Nodes: Enlarged heterogeneous thyroid gland without discrete thyroid nodules. Unremarkable esophagus. No axillary adenopathy. Enlarged 1.4 cm right paratracheal node (series 5/image 52). Enlarged 1.4 cm right subcarinal node (series 5/image 66). No discrete hilar adenopathy on this noncontrast scan Lungs/Pleura: No pneumothorax. No pleural effusion. No acute consolidative airspace disease or lung masses. A few scattered small solid pulmonary nodules in the upper lobes bilaterally, largest 4 mm in the apical left upper lobe (series 6/image 18). There is a mosaic attenuation throughout both lungs, which appears to represent mild to moderate patchy air trapping on the expiration sequence. There is mild patchy subpleural reticulation in both lungs. No significant regions of ground-glass attenuation, traction bronchiectasis, parenchymal banding, architectural distortion or frank honeycombing. Upper abdomen: Cholecystectomy. Partial visualization of right hydronephrosis, at least moderate. Left adrenal 1.9 cm nodule with density 25 HU. Musculoskeletal: No aggressive appearing focal osseous lesions. Mild thoracic spondylosis. IMPRESSION: 1. No convincing findings of interstitial lung disease at this time. Mild patchy subpleural reticulation without significant bronchiectasis or honeycombing. The possibility of a mild interstitial lung disease such as nonspecific interstitial pneumonia (NSIP) is not excluded. A follow-up high-resolution chest CT study in 12 months may be obtained to assess temporal pattern stability, as clinically warranted. 2. Mild-to-moderate patchy air trapping in both lungs. 3. Mild  cardiomegaly.  One vessel coronary atherosclerosis.  4. Nonspecific mild mediastinal lymphadenopathy. 5. Partial visualization of right hydronephrosis, at least moderate in severity, of uncertain etiology. Recommend correlation with serum creatinine. CT abdomen/pelvis with IV contrast may be considered for further evaluation. 6. Indeterminate left adrenal nodule, statistically most likely an adenoma. Follow-up adrenal without and with protocol IV contrast may be CT abdomen considered in 12 months. This recommendation follows ACR consensus guidelines: Management of Incidental Adrenal Masses: A White Paper of the ACR Incidental Findings Committee. J Am Coll Radiol 2017;14:1038-1044. Aortic Atherosclerosis (ICD10-I70.0). Electronically Signed   By: Ilona Sorrel M.D.   On: 11/27/2017 07:15     Diagnostic Studies/Procedures     RHC: 11/23/2017  1. Elevated right and left heart filling pressures.  2. Pulmonary hypertension, PVR 3.6 WU so appears to be primarily pulmonary venous hypertension.  3. Preserved cardiac output   Echocardiogram: 11/23/2017 Study Conclusions  - Left ventricle: The cavity size was normal. Wall thickness was   increased in a pattern of mild LVH. Systolic function was mildly   reduced. The estimated ejection fraction was in the range of 45%   to 50%. Septal-lateral dyssynchrony with mild diffuse   hypokinesis. Features are consistent with a pseudonormal left   ventricular filling pattern, with concomitant abnormal relaxation   and increased filling pressure (grade 2 diastolic dysfunction). - Aortic valve: There was no stenosis. - Mitral valve: Mildly calcified annulus. There was trivial   regurgitation. - Left atrium: The atrium was mildly dilated. - Right ventricle: The cavity size was normal. Systolic function   was normal. - Pulmonary arteries: No complete TR doppler jet so unable to   estimate PA systolic pressure. - Systemic veins: IVC poorly visualized.  Impressions:  - Normal LV size with mild LV hypertrophy.  EF 45-50%, mild diffuse   hypokinesis with septal-lateral dyssynchrony. Normal RV size and   systolic function. No significant valvular abnormalities.   Disposition   Pt is being discharged home today in good condition.  Follow-up Plans & Appointments    Follow-up Information    [facility] Follow up.   Specialty:  [place] Why:  Home Health Physical Therapy and RN -agency will call to arrange initial visit Contact information: [place] [address] [phone]        Larey Dresser, MD Follow up on 12/07/2017.   Specialty:  Cardiology Why:  11:00AM Contact information: [place] [address] [phone]          Discharge Instructions    Amb Referral to Nutrition and Diabetic E   Complete by:  As directed       Discharge Medications     Medication List    TAKE these medications   albuterol 108 (90 Base) MCG/ACT inhaler Commonly known as:  PROVENTIL HFA;VENTOLIN HFA Inhale 2 puffs into the lungs every 4 (four) hours as needed for shortness of breath.   atorvastatin 80 MG tablet Commonly known as:  LIPITOR Take 80 mg by mouth daily.   calcitRIOL 0.25 MCG capsule Commonly known as:  ROCALTROL Take 0.25 mcg by mouth daily.   fluticasone 0.005 % ointment Commonly known as:  CUTIVATE Apply 1 application topically 2 (two) times daily.   guaiFENesin 600 MG 12 hr tablet Commonly known as:  MUCINEX Take 600 mg by mouth daily as needed for cough.   hydrALAZINE 50 MG tablet Commonly known as:  APRESOLINE Take 1.5 tablets (75 mg  total) by mouth 2 (two) times daily.   insulin aspart 100 UNIT/ML injection Commonly known as:  novoLOG Inject 10 Units into the skin 2 (two) times daily before lunch and supper.   isosorbide mononitrate 30 MG 24 hr tablet Commonly known as:  IMDUR Take 1 tablet (30 mg total) by mouth daily.   latanoprost 0.005 % ophthalmic solution Commonly known as:   XALATAN Place 1 drop into both eyes at bedtime.   metoprolol succinate 25 MG 24 hr tablet Commonly known as:  TOPROL-XL Take 25 mg by mouth 2 (two) times daily.   NUCYNTA ER 50 MG 12 hr tablet Generic drug:  tapentadol Take 50 mg by mouth every 12 (twelve) hours.   potassium chloride SA 20 MEQ tablet Commonly known as:  K-DUR,KLOR-CON Take 2 tablets (40 mEq total) by mouth daily.   repaglinide 0.5 MG tablet Commonly known as:  PRANDIN Take 0.5 mg by mouth 3 (three) times daily before meals.   Rivaroxaban 15 MG Tabs tablet Commonly known as:  XARELTO Take 1 tablet (15 mg total) by mouth daily with supper.   torsemide 20 MG tablet Commonly known as:  DEMADEX Take 2-4 tablets (40-80 mg total) by mouth See admin instructions. Take 80 mg (4 Tablets) in the AM and 40 mg (2 Tablets) in the PM   traMADol 50 MG tablet Commonly known as:  ULTRAM Take 50 mg by mouth every 6 (six) hours as needed for severe pain.   TRESIBA FLEXTOUCH 100 UNIT/ML Sopn FlexTouch Pen Generic drug:  insulin degludec Inject 40 Units into the skin every morning.   triamcinolone cream 0.1 % Commonly known as:  KENALOG Apply 1 application topically 2 (two) times daily.         Allergies Allergies  Allergen Reactions  . Eggs Or Egg-Derived Products Nausea And Vomiting  . Lisinopril Nausea And Vomiting  . Penicillins Nausea And Vomiting    Has patient had a PCN reaction causing immediate rash, facial/tongue/throat swelling, SOB or lightheadedness with hypotension: No Has patient had a PCN reaction causing severe rash involving mucus membranes or skin necrosis: No Has patient had a PCN reaction that required hospitalization: No Has patient had a PCN reaction occurring within the last 10 years: No If all of the above answers are "NO", then may proceed with Cephalosporin use.     Acute coronary syndrome (MI, NSTEMI, STEMI, etc) this admission?: No.   Outstanding Labs/Studies   BMET at  follow-up  Duration of Discharge Encounter   Greater than 30 minutes including physician time.  Signed, Erma Heritage, PA-C 11/27/2017, 2:36 PM   Patient seen and examined with the above-signed Advanced Practice Provider and/or Housestaff. I personally reviewed laboratory data, imaging studies and relevant notes. I independently examined the patient and formulated the important aspects of the plan. I have edited the note to reflect any of my changes or salient points. I have personally discussed the plan with the patient and/or family.  She is stable for d/c. See my rounding note for further details.  Renal u/s shows single kidney with mild to moderate hydronephrosis on right. No change from 2011. Will refer to Urology as outpatient.   Glori Bickers, MD  3:40 PM

## 2017-11-27 NOTE — Progress Notes (Addendum)
Patient ID: Stephanie Woodward, female   DOB: 03/30/1940, 77 y.o.   MRN: [id number]     Advanced Heart Failure Rounding Note  PCP-Cardiologist: No primary care provider on file.   Subjective:    Remains on IV lasix. Weight down another 1.5 pounds but looks like only 2 pounds total from admit.   PYP scan negative for TTR amyloidosis.   Echo: EF 45-50%, septal-lateral dyssynchrony, normal RV.   RHC Procedural Findings: Hemodynamics (mmHg) RA mean 13 RV 68/14 PA 74/27, mean 50 PCWP mean 28 Oxygen saturations: PA 68% AO 97% Cardiac Output (Fick) 6.08  Cardiac Index (Fick) 3.12 PVR 3.6 WU  PFTs: Severe restriction, concern for interstitial process.    Objective:   Weight Range: 94.4 kg Body mass index is 38.08 kg/m.   Vital Signs:   Temp:  [97.3 F (36.3 C)-98.8 F (37.1 C)] 97.3 F (36.3 C) (10/06 1211) Pulse Rate:  [55-82] 63 (10/06 1211) Resp:  [18-20] 18 (10/06 1211) BP: (100-137)/(24-61) 111/43 (10/06 1211) SpO2:  [97 %-99 %] 98 % (10/06 1211) Weight:  [94.4 kg] 94.4 kg (10/06 0523) Last BM Date: 11/25/17  Weight change: Filed Weights   11/25/17 0030 11/26/17 0529 11/27/17 0523  Weight: 94.2 kg 95.1 kg 94.4 kg    Intake/Output:   Intake/Output Summary (Last 24 hours) at 11/27/2017 1233 Last data filed at 11/27/2017 0925 Gross per 24 hour  Intake 960 ml  Output 2200 ml  Net -1240 ml      Physical Exam    General:  Well appearing. Sitting in chair No resp difficulty HEENT: normal Neck: supple. Thick heard to assess JVP - appears 7-8. Carotids 2+ bilat; no bruits. No lymphadenopathy or thryomegaly appreciated. Cor: PMI nondisplaced. Regular rate & rhythm. No rubs, gallops or murmurs. Lungs: clear Abdomen: obese soft, nontender, nondistended. No hepatosplenomegaly. No bruits or masses. Good bowel sounds. Extremities: no cyanosis, clubbing, rash, edema Neuro: alert & orientedx3, cranial nerves grossly intact. moves all 4 extremities w/o difficulty.  Affect pleasant   Telemetry   NSR in 70s (personally reviewed)  Labs    CBC Recent Labs    11/26/17 0619 11/27/17 0647  WBC 12.4* 12.3*  HGB 10.9* 10.5*  HCT 38.3 36.3  MCV 85.1 84.0  PLT 383 585   Basic Metabolic Panel Recent Labs    11/26/17 0619 11/27/17 0647  NA 143 140  K 4.8 5.1  CL 107 107  CO2 25 25  GLUCOSE 109* 104*  BUN 38* 42*  CREATININE 2.40* 2.52*  CALCIUM 9.0 8.8*   Liver Function Tests No results for input(s): AST, ALT, ALKPHOS, BILITOT, PROT, ALBUMIN in the last 72 hours. No results for input(s): LIPASE, AMYLASE in the last 72 hours. Cardiac Enzymes No results for input(s): CKTOTAL, CKMB, CKMBINDEX, TROPONINI in the last 72 hours.  BNP: BNP (last 3 results) Recent Labs    11/17/17 1247 11/23/17 0821  BNP 251.5* 271.8*    ProBNP (last 3 results) No results for input(s): PROBNP in the last 8760 hours.   D-Dimer No results for input(s): DDIMER in the last 72 hours. Hemoglobin A1C No results for input(s): HGBA1C in the last 72 hours. Fasting Lipid Panel No results for input(s): CHOL, HDL, LDLCALC, TRIG, CHOLHDL, LDLDIRECT in the last 72 hours. Thyroid Function Tests No results for input(s): TSH, T4TOTAL, T3FREE, THYROIDAB in the last 72 hours.  Invalid input(s): FREET3  Other results:   Imaging    Ct Chest High Resolution  Result Date: 11/27/2017 CLINICAL DATA:  Inpatient.  Restrictive PFTs.  History of CHF. EXAM: CT CHEST WITHOUT CONTRAST TECHNIQUE: Multidetector CT imaging of the chest was performed following the standard protocol without intravenous contrast. High resolution imaging of the lungs, as well as inspiratory and expiratory imaging, was performed. COMPARISON:  11/23/2017 chest radiograph. FINDINGS: Cardiovascular: Mild cardiomegaly. No significant pericardial effusion/thickening. Left circumflex coronary atherosclerosis. Atherosclerotic nonaneurysmal thoracic aorta. Top-normal main pulmonary artery (3.1 cm diameter).  Mediastinum/Nodes: Enlarged heterogeneous thyroid gland without discrete thyroid nodules. Unremarkable esophagus. No axillary adenopathy. Enlarged 1.4 cm right paratracheal node (series 5/image 52). Enlarged 1.4 cm right subcarinal node (series 5/image 66). No discrete hilar adenopathy on this noncontrast scan Lungs/Pleura: No pneumothorax. No pleural effusion. No acute consolidative airspace disease or lung masses. A few scattered small solid pulmonary nodules in the upper lobes bilaterally, largest 4 mm in the apical left upper lobe (series 6/image 18). There is a mosaic attenuation throughout both lungs, which appears to represent mild to moderate patchy air trapping on the expiration sequence. There is mild patchy subpleural reticulation in both lungs. No significant regions of ground-glass attenuation, traction bronchiectasis, parenchymal banding, architectural distortion or frank honeycombing. Upper abdomen: Cholecystectomy. Partial visualization of right hydronephrosis, at least moderate. Left adrenal 1.9 cm nodule with density 25 HU. Musculoskeletal: No aggressive appearing focal osseous lesions. Mild thoracic spondylosis. IMPRESSION: 1. No convincing findings of interstitial lung disease at this time. Mild patchy subpleural reticulation without significant bronchiectasis or honeycombing. The possibility of a mild interstitial lung disease such as nonspecific interstitial pneumonia (NSIP) is not excluded. A follow-up high-resolution chest CT study in 12 months may be obtained to assess temporal pattern stability, as clinically warranted. 2. Mild-to-moderate patchy air trapping in both lungs. 3. Mild cardiomegaly.  One vessel coronary atherosclerosis. 4. Nonspecific mild mediastinal lymphadenopathy. 5. Partial visualization of right hydronephrosis, at least moderate in severity, of uncertain etiology. Recommend correlation with serum creatinine. CT abdomen/pelvis with IV contrast may be considered for further  evaluation. 6. Indeterminate left adrenal nodule, statistically most likely an adenoma. Follow-up adrenal without and with protocol IV contrast may be CT abdomen considered in 12 months. This recommendation follows ACR consensus guidelines: Management of Incidental Adrenal Masses: A White Paper of the ACR Incidental Findings Committee. J Am Coll Radiol 2017;14:1038-1044. Aortic Atherosclerosis (ICD10-I70.0). Electronically Signed   By: Ilona Sorrel M.D.   On: 11/27/2017 07:15     Medications:     Scheduled Medications: . atorvastatin  80 mg Oral Daily  . calcitRIOL  0.25 mcg Oral Daily  . hydrALAZINE  75 mg Oral BID  . insulin aspart  0-15 Units Subcutaneous TID WC  . insulin aspart  0-5 Units Subcutaneous QHS  . insulin aspart  4 Units Subcutaneous TID WC  . insulin glargine  40 Units Subcutaneous Daily  . isosorbide mononitrate  30 mg Oral Daily  . latanoprost  1 drop Both Eyes QHS  . metoprolol succinate  25 mg Oral BID  . potassium chloride  40 mEq Oral BID  . repaglinide  0.5 mg Oral TID AC  . Rivaroxaban  15 mg Oral Q supper  . sodium chloride flush  3 mL Intravenous Q12H  . tapentadol  50 mg Oral Q12H  . torsemide  40 mg Oral QPM  . torsemide  80 mg Oral QPC breakfast  . triamcinolone cream  1 application Topical BID    Infusions: . sodium chloride      PRN Medications: sodium chloride, acetaminophen, albuterol, guaiFENesin, ondansetron (ZOFRAN) IV, sodium chloride flush,  traMADol   Assessment/Plan   1. Acute on chronic diastolic CHF: Echo in 2/57 with EF 50-55%, mild LVH, grade 2 diastolic dysfunction. 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 [place] 11/23/17 showed significantly elevated right and left heart filling pressures and severe primarily pulmonary venous hypertension.  Cardiac output preserved. Echo (10/19) with EF 45-50%, septal-lateral dyssynchrony, RV looks ok.  NYHA class IIIb symptoms at home,  gradually worsening and complicated by CKD stage 3. She was admitted for diuresis, decent urine output with IV Lasix but weight not down too  Much. Symptoms improved. Creatinine 2.2>2.2>2.4> 2.5.  - Ok for d/c today on torsemide 80 mg /40 mg - LVH and diastolic CHF could be related to HTN, but amyloidosis also considered. Myeloma panel has been done and was negative. She cannot get contrast with MRI due to elevated creatinine. PYP scan negative for TTR amyloidosis.  Doubt cardiac amyloidosis.  2. COPD: She no longer smokes.  PFTs actually were markedly restrictive suggestive of an interstitial process.   3. HTN: Continue home dose of hydralazine/Imdur and Toprol XL.  4. CKD: Stage 3. Follow closely with diuresis. Creatinine mildly higher today at 2.4.   5. Atrial fibrillation: Paroxysmal.  She is in NSR.   - Continue Xarelto.  6. Pulmonary hypertension: Moderate to severe pulmonary hypertension on RHC.  PVR 3.6 WU.  This appears to be primarily pulmonary venous hypertension though would be reasonable to repeat RHC after better diuresis.  7. Restrictive PFTs: May be due in part to pulmonary edema but has now been diuresed.  High-res CT with no ILD but ? Right hydronephrosis 8. 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 Can defer u/s to outpatient if cannot be done today.   Can go home today on torsemide 80/40 f/u in [facility] Clinic 1-2 weeks with BMET   Length of Stay: [place], MD  11/27/2017, 12:33 PM  Advanced Heart Failure Team Pager [phone] (M-F; [place])  Please contact [facility] for night-coverage after hours (4p -7a ) and weekends on [email or web address]

## 2017-11-28 ENCOUNTER — Telehealth (HOSPITAL_COMMUNITY): Payer: Self-pay

## 2017-11-28 NOTE — Telephone Encounter (Signed)
No order placed for Cardiac Rehab. Under Medical review for Pulmonary Rehab.

## 2017-12-01 ENCOUNTER — Encounter: Payer: Self-pay | Admitting: Cardiology

## 2017-12-01 DIAGNOSIS — Z87891 Personal history of nicotine dependence: Secondary | ICD-10-CM | POA: Diagnosis not present

## 2017-12-01 DIAGNOSIS — Z7984 Long term (current) use of oral hypoglycemic drugs: Secondary | ICD-10-CM | POA: Diagnosis not present

## 2017-12-01 DIAGNOSIS — N183 Chronic kidney disease, stage 3 (moderate): Secondary | ICD-10-CM | POA: Diagnosis not present

## 2017-12-01 DIAGNOSIS — J449 Chronic obstructive pulmonary disease, unspecified: Secondary | ICD-10-CM | POA: Diagnosis not present

## 2017-12-01 DIAGNOSIS — I2510 Atherosclerotic heart disease of native coronary artery without angina pectoris: Secondary | ICD-10-CM | POA: Diagnosis not present

## 2017-12-01 DIAGNOSIS — E785 Hyperlipidemia, unspecified: Secondary | ICD-10-CM | POA: Diagnosis not present

## 2017-12-01 DIAGNOSIS — Z452 Encounter for adjustment and management of vascular access device: Secondary | ICD-10-CM | POA: Diagnosis not present

## 2017-12-01 DIAGNOSIS — Z95810 Presence of automatic (implantable) cardiac defibrillator: Secondary | ICD-10-CM | POA: Diagnosis not present

## 2017-12-01 DIAGNOSIS — I5033 Acute on chronic diastolic (congestive) heart failure: Secondary | ICD-10-CM | POA: Diagnosis not present

## 2017-12-01 DIAGNOSIS — E669 Obesity, unspecified: Secondary | ICD-10-CM | POA: Diagnosis not present

## 2017-12-01 DIAGNOSIS — I272 Pulmonary hypertension, unspecified: Secondary | ICD-10-CM | POA: Diagnosis not present

## 2017-12-01 DIAGNOSIS — I480 Paroxysmal atrial fibrillation: Secondary | ICD-10-CM | POA: Diagnosis not present

## 2017-12-01 DIAGNOSIS — Z79899 Other long term (current) drug therapy: Secondary | ICD-10-CM | POA: Diagnosis not present

## 2017-12-01 DIAGNOSIS — Z6837 Body mass index (BMI) 37.0-37.9, adult: Secondary | ICD-10-CM | POA: Diagnosis not present

## 2017-12-01 DIAGNOSIS — M109 Gout, unspecified: Secondary | ICD-10-CM | POA: Diagnosis not present

## 2017-12-01 DIAGNOSIS — E1122 Type 2 diabetes mellitus with diabetic chronic kidney disease: Secondary | ICD-10-CM | POA: Diagnosis not present

## 2017-12-01 DIAGNOSIS — Z7901 Long term (current) use of anticoagulants: Secondary | ICD-10-CM | POA: Diagnosis not present

## 2017-12-01 DIAGNOSIS — I130 Hypertensive heart and chronic kidney disease with heart failure and stage 1 through stage 4 chronic kidney disease, or unspecified chronic kidney disease: Secondary | ICD-10-CM | POA: Diagnosis not present

## 2017-12-01 DIAGNOSIS — Z5181 Encounter for therapeutic drug level monitoring: Secondary | ICD-10-CM | POA: Diagnosis not present

## 2017-12-01 DIAGNOSIS — Z79891 Long term (current) use of opiate analgesic: Secondary | ICD-10-CM | POA: Diagnosis not present

## 2017-12-06 NOTE — Progress Notes (Signed)
Advanced Heart Failure Clinic Note  PCP: Alroy Dust, L.Marlou Sa, MD Cardiology: Dr. Wynonia Lawman HF Cardiology: Dr. Aundra Dubin  77 y.o.with history of CKD stage 3, paroxysmal atrial fibrillation, and chronic diastolic CHF was referred by Dr. Wynonia Lawman for evaluation of CHF.  RHC/LHC was done in 10/15.  This showed markedly elevated filling pressures and pulmonary venous hypertension.  There was no significant CAD.  Last echo in 2/18 showed EF 50-55%, mild LVH, grade 2 diastolic dysfunction. She is followed by Dr. Moshe Cipro for nephrology.   Admitted 10/2-10/6/19 with SOB. RHC showed elevated filling pressures and preserved CO. She was diuresed with IV lasix, then transitioned to torsemide 80 mg am, 40 mg pm. Repeat echo showed EF 45-50% with normal RV. PYP scan was negative for TTR amyloid. PFTs showed restrictive disease. CT chest showed no ILD but did show right hydronephrosis. Renal US showed single kidney with mild to moderate hydronephrosis on right (unchanged since 2011).  DC weight 208 lbs.   She returns today for post hospital follow up with her daughter. Overall doing okay. She is SOB with walking short distances and with most ADLs. She is about at her baseline with SOB. She has noticed new abdominal bloating since she was discharged. No peripheral edema, orthopnea, or PND. No dizziness, CP, or palpitations. Energy level poor. She has a productive cough with white sputum. No fever or chills. Urinates a lot with torsemide. Limits salt intake. Drinks about 2 L fluid/day. Weights stable at home 208-209 lbs. Up 4 lbs on our scale today. Taking all medications.   PYP scan negative for TTR amyloidosis.   Echo: EF 45-50%, septal-lateral dyssynchrony, normal RV.   RHC Procedural Findings: Hemodynamics (mmHg) RA mean 13 RV 68/14 PA 74/27, mean 50 PCWP mean 28 Oxygen saturations: PA 68% AO 97% Cardiac Output (Fick) 6.08  Cardiac Index (Fick) 3.12 PVR 3.6 WU  PFTs: Severe restriction, concern for  interstitial process.   ECG (personally reviewed): NSR, left axis deviation, IVCD 122 msec, inferior Qs, old anterior MI  Labs (2/18): K 4, creatinine 1.79  PMH: 1. Atrial fibrillation: Paroxysmal.  2. HTN 3. 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  5. CKD stage 3 6. H/o IVCD 7. COPD 8. Gout 9. Chronic primarily diastolic CHF: Echo (3/78) with EF 50-55%, mild LVH, grade 2 diastolic dysfunction.  - LHC/RHC (10/15): small distal LAD but no discrete stenosis.  Mean RA 15, PA 55/20 mean 34, mean PCWP 31 => pulmonary venous hypertension.  10. Sleep study 2016: Negative per patient.   Review of systems complete and found to be negative unless listed in HPI.   Social History   Socioeconomic History  . Marital status: Widowed    Spouse name: Not on file  . Number of children: 2  . Years of education: Not on file  . Highest education level: Not on file  Occupational History  . Occupation: retired  Scientific laboratory technician  . Financial resource strain: Not on file  . Food insecurity:    Worry: Not on file    Inability: Not on file  . Transportation needs:    Medical: Not on file    Non-medical: Not on file  Tobacco Use  . Smoking status: Former Smoker    Packs/day: 1.00    Years: 20.00    Pack years: 20.00    Types: Cigarettes    Last attempt to quit: 02/22/2009    Years since quitting: 8.7  . Smokeless tobacco: Never Used  Substance and Sexual Activity  .  Alcohol use: No    Alcohol/week: 0.0 standard drinks    Comment: quit 2011  . Drug use: No  . Sexual activity: Never  Lifestyle  . Physical activity:    Days per week: Not on file    Minutes per session: Not on file  . Stress: Not on file  Relationships  . Social connections:    Talks on phone: Not on file    Gets together: Not on file    Attends religious service: Not on file    Active member of club or organization: Not on file    Attends meetings of clubs or organizations: Not on file    Relationship  status: Not on file  . Intimate partner violence:    Fear of current or ex partner: Not on file    Emotionally abused: Not on file    Physically abused: Not on file    Forced sexual activity: Not on file  Other Topics Concern  . Not on file  Social History Narrative  . Not on file   Family History  Problem Relation Age of Onset  . Lupus Daughter   . Cancer Mother 10  . CVA Father 55  . Cancer Brother 45   Review of systems complete and found to be negative unless listed in HPI.   Current Outpatient Medications  Medication Sig Dispense Refill  . albuterol (PROVENTIL HFA;VENTOLIN HFA) 108 (90 BASE) MCG/ACT inhaler Inhale 2 puffs into the lungs every 4 (four) hours as needed for shortness of breath.     Marland Kitchen atorvastatin (LIPITOR) 80 MG tablet Take 80 mg by mouth daily.    . calcitRIOL (ROCALTROL) 0.25 MCG capsule Take 0.25 mcg by mouth daily.    . fluticasone (CUTIVATE) 0.005 % ointment Apply 1 application topically 2 (two) times daily.  2  . guaiFENesin (MUCINEX) 600 MG 12 hr tablet Take 600 mg by mouth daily as needed for cough.    . hydrALAZINE (APRESOLINE) 50 MG tablet Take 1.5 tablets (75 mg total) by mouth 2 (two) times daily. 90 tablet 3  . insulin aspart (NOVOLOG) 100 UNIT/ML injection Inject 10 Units into the skin 2 (two) times daily before lunch and supper.     . insulin degludec (TRESIBA FLEXTOUCH) 100 UNIT/ML SOPN FlexTouch Pen Inject 40 Units into the skin every morning.     . isosorbide mononitrate (IMDUR) 30 MG 24 hr tablet Take 1 tablet (30 mg total) by mouth daily. 30 tablet 3  . latanoprost (XALATAN) 0.005 % ophthalmic solution Place 1 drop into both eyes at bedtime.    . metoprolol succinate (TOPROL-XL) 25 MG 24 hr tablet Take 25 mg by mouth 2 (two) times daily.    Gean Birchwood ER 50 MG 12 hr tablet Take 50 mg by mouth every 12 (twelve) hours.  0  . potassium chloride SA (K-DUR,KLOR-CON) 20 MEQ tablet Take 2 tablets (40 mEq total) by mouth daily. 60 tablet 5  .  repaglinide (PRANDIN) 0.5 MG tablet Take 0.5 mg by mouth 3 (three) times daily before meals.     . Rivaroxaban (XARELTO) 15 MG TABS tablet Take 1 tablet (15 mg total) by mouth daily with supper. 60 tablet 0  . torsemide (DEMADEX) 20 MG tablet Take 2-4 tablets (40-80 mg total) by mouth See admin instructions. Take 80 mg (4 Tablets) in the AM and 40 mg (2 Tablets) in the PM    . traMADol (ULTRAM) 50 MG tablet Take 50 mg by mouth every 6 (six)  hours as needed for severe pain.     Marland Kitchen triamcinolone cream (KENALOG) 0.1 % Apply 1 application topically 2 (two) times daily.     No current facility-administered medications for this encounter.    Today's Vitals   12/07/17 1051  BP: (!) 154/82  Pulse: 83  SpO2: 94%  Weight: 96.3 kg (212 lb 3.2 oz)   Body mass index is 38.81 kg/m. Wt Readings from Last 3 Encounters:  12/07/17 96.3 kg (212 lb 3.2 oz)  11/27/17 94.4 kg (208 lb 3.2 oz)  11/17/17 95.5 kg (210 lb 9.6 oz)   General: Well appearing. No resp difficulty. HEENT: Normal Neck: Supple. JVP ~8. Carotids 2+ bilat; no bruits. No thyromegaly or nodule noted. Cor: PMI nondisplaced. RRR, No M/G/R noted Lungs: CTAB, normal effort. Abdomen: Soft, non-tender, non-distended, no HSM. No bruits or masses. +BS  Extremities: No cyanosis, clubbing, or rash. R and LLE trace edema.  Neuro: Alert & orientedx3, cranial nerves grossly intact. moves all 4 extremities w/o difficulty. Affect pleasant  EKG: NSR with PVCs, 83 bpm, incomplete LBBB. Personally reviewed.   Assessment/Plan: 1. Chronic diastolic CHF: Echo in 9/75 with EF 50-55%, mild LVH, grade 2 diastolic dysfunction. LHC in 2015 showed nonobstructive CAD and elevated filling pressures with pulmonary venous hypertension.[place] 11/23/17 showed significantly elevated right and left heart filling pressures and severe primarily pulmonary venous hypertension. Cardiac output preserved. Echo (10/19) with EF 45-50%, septal-lateral dyssynchrony, RV looks ok.  NYHA class IIIb symptoms at home, gradually worsening and complicated by CKD stage 3.  - Volume status mildly elevated on exam. - Continue torsemide 80 mg /40 mg. Take extra 40 mg torsemide today. Discussed s/s to look for with excess fluid. Instructed her to call us for weight gain, bloating, or worsening SOB. BMET today - No spiro with CKD - LVH and diastolic CHF could be related to HTN, but amyloidosis also considered.Myeloma panel has been done and was negative.She cannot get contrast with MRI due to elevated creatinine.PYP scan negative for TTR amyloidosis. Doubt cardiac amyloidosis.  2. COPD: She no longer smokes. Restrictive PFTs. High rest CT negative for ILD.  3. HTN: Elevated today with mild volume overload. She does not check at home. Continue hydralazine 75 mg bid and Imdur 30 mg daily.  4. CKD: Stage 3.  BMET today. 5. Atrial fibrillation: Paroxysmal.   - Continue Xarelto 15 mg daily.  - Continue Toprol XL 25 mg BID - EKG today shows NSR.  6. Pulmonary HTN: Moderate to severe pulmonary hypertension on RHC. PVR 3.6 WU. This appears to be primarily pulmonary venous hypertension though would be reasonable to repeat RHC after better diuresis.  7. Restrictive PFTs: May be due in part to pulmonary edema but has now been diuresed.  High-res CT with no ILD but ? Right hydronephrosis 8. Right hydronephrosis - Renal US showed single kidney with mild to moderate hydronephrosis on right (unchanged since 2011). - Refer to urology   Take additional 40 mg torsemide today, then back to 80 mg am, 40 mg pm.  BMET today  Refer to urology for hydronephrosis  Follow up 4-6 weeks Schedule follow up with Dr Aundra Dubin in 3 months  Georgiana Shore 12/07/2017   Greater than 50% of the 25 minute visit was spent in counseling/coordination of care regarding disease state education, salt/fluid restriction, sliding scale diuretics, and medication compliance.

## 2017-12-07 ENCOUNTER — Ambulatory Visit (HOSPITAL_COMMUNITY)
Admission: RE | Admit: 2017-12-07 | Discharge: 2017-12-07 | Disposition: A | Discharge status: Home / Self Care | Payer: PPO | Source: Ambulatory Visit | Attending: Family Medicine | Admitting: Family Medicine

## 2017-12-07 ENCOUNTER — Ambulatory Visit (HOSPITAL_BASED_OUTPATIENT_CLINIC_OR_DEPARTMENT_OTHER)
Admission: RE | Admit: 2017-12-07 | Discharge: 2017-12-07 | Disposition: A | Discharge status: Home / Self Care | Payer: PPO | Source: Ambulatory Visit | Attending: Internal Medicine | Admitting: Internal Medicine

## 2017-12-07 ENCOUNTER — Encounter (HOSPITAL_COMMUNITY): Payer: Self-pay

## 2017-12-07 ENCOUNTER — Other Ambulatory Visit (HOSPITAL_COMMUNITY): Payer: PPO

## 2017-12-07 VITALS — BP 154/82 | HR 83 | Wt 212.2 lb

## 2017-12-07 DIAGNOSIS — I272 Pulmonary hypertension, unspecified: Secondary | ICD-10-CM | POA: Diagnosis not present

## 2017-12-07 DIAGNOSIS — I5032 Chronic diastolic (congestive) heart failure: Secondary | ICD-10-CM | POA: Insufficient documentation

## 2017-12-07 DIAGNOSIS — N183 Chronic kidney disease, stage 3 unspecified: Secondary | ICD-10-CM

## 2017-12-07 DIAGNOSIS — N1330 Unspecified hydronephrosis: Secondary | ICD-10-CM | POA: Diagnosis not present

## 2017-12-07 DIAGNOSIS — E785 Hyperlipidemia, unspecified: Secondary | ICD-10-CM | POA: Diagnosis not present

## 2017-12-07 DIAGNOSIS — I2510 Atherosclerotic heart disease of native coronary artery without angina pectoris: Secondary | ICD-10-CM | POA: Insufficient documentation

## 2017-12-07 DIAGNOSIS — Z79899 Other long term (current) drug therapy: Secondary | ICD-10-CM | POA: Insufficient documentation

## 2017-12-07 DIAGNOSIS — I480 Paroxysmal atrial fibrillation: Secondary | ICD-10-CM | POA: Diagnosis not present

## 2017-12-07 DIAGNOSIS — J449 Chronic obstructive pulmonary disease, unspecified: Secondary | ICD-10-CM | POA: Diagnosis not present

## 2017-12-07 DIAGNOSIS — I130 Hypertensive heart and chronic kidney disease with heart failure and stage 1 through stage 4 chronic kidney disease, or unspecified chronic kidney disease: Secondary | ICD-10-CM | POA: Insufficient documentation

## 2017-12-07 DIAGNOSIS — R9431 Abnormal electrocardiogram [ECG] [EKG]: Secondary | ICD-10-CM | POA: Insufficient documentation

## 2017-12-07 DIAGNOSIS — I5022 Chronic systolic (congestive) heart failure: Secondary | ICD-10-CM

## 2017-12-07 DIAGNOSIS — Z7901 Long term (current) use of anticoagulants: Secondary | ICD-10-CM | POA: Insufficient documentation

## 2017-12-07 DIAGNOSIS — Z87891 Personal history of nicotine dependence: Secondary | ICD-10-CM | POA: Insufficient documentation

## 2017-12-07 DIAGNOSIS — I491 Atrial premature depolarization: Secondary | ICD-10-CM | POA: Insufficient documentation

## 2017-12-07 DIAGNOSIS — M109 Gout, unspecified: Secondary | ICD-10-CM | POA: Insufficient documentation

## 2017-12-07 DIAGNOSIS — Z794 Long term (current) use of insulin: Secondary | ICD-10-CM | POA: Insufficient documentation

## 2017-12-07 DIAGNOSIS — E1122 Type 2 diabetes mellitus with diabetic chronic kidney disease: Secondary | ICD-10-CM | POA: Insufficient documentation

## 2017-12-07 LAB — BASIC METABOLIC PANEL
Anion gap: 9 (ref 5–15)
BUN: 45 mg/dL — ABNORMAL HIGH (ref 8–23)
CO2: 29 mmol/L (ref 22–32)
Calcium: 8.8 mg/dL — ABNORMAL LOW (ref 8.9–10.3)
Chloride: 103 mmol/L (ref 98–111)
Creatinine, Ser: 2.4 mg/dL — ABNORMAL HIGH (ref 0.44–1.00)
GFR calc Af Amer: 22 mL/min — ABNORMAL LOW (ref >60)
GFR calc non Af Amer: 19 mL/min — ABNORMAL LOW (ref >60)
Glucose, Bld: 120 mg/dL — ABNORMAL HIGH (ref 70–99)
Potassium: 4.5 mmol/L (ref 3.5–5.1)
Sodium: 141 mmol/L (ref 135–145)

## 2017-12-07 NOTE — Patient Instructions (Addendum)
Today you have been seen at the Heart failure clinic at [facility]   Medication changes:  increase torsemide 80mg  twice a day  for today then back to 80/40  Lab work done today: BMET  We will call you if your lab work is abnormal.. No news is good news!!   Follow up with  NP/PA in 4 week and Dr. Aundra Dubin in 3 months   You have been referred to: Urology  Do the following things EVERYDAY: 1) Weigh yourself in the morning before breakfast. Write it down and keep it in a log. 2) Take your medicines as prescribed 3) Eat low salt foods-Limit salt (sodium) to 2000 mg per day.  4) Stay as active as you can everyday 5) Limit all fluids for the day to less than 2 liters

## 2017-12-09 ENCOUNTER — Telehealth: Payer: Self-pay

## 2017-12-09 NOTE — Telephone Encounter (Signed)
Thanks, pt actually had this test done while she was in the hospital on 11/24/17, thanks

## 2017-12-09 NOTE — Telephone Encounter (Signed)
New message     Just an FYI. We have made several attempts to contact this patient including sending a letter to schedule or reschedule their [place]. We will be removing the patient from the WQ.   Thank you

## 2017-12-12 ENCOUNTER — Other Ambulatory Visit (HOSPITAL_COMMUNITY): Payer: Self-pay | Admitting: *Deleted

## 2017-12-12 DIAGNOSIS — I5022 Chronic systolic (congestive) heart failure: Secondary | ICD-10-CM

## 2017-12-12 NOTE — Progress Notes (Signed)
Order for pulm rehab placed per Dr Aundra Dubin

## 2017-12-16 ENCOUNTER — Encounter: Payer: PPO | Attending: Family Medicine | Admitting: Registered"

## 2017-12-16 ENCOUNTER — Encounter: Payer: Self-pay | Admitting: Registered"

## 2017-12-16 DIAGNOSIS — E11649 Type 2 diabetes mellitus with hypoglycemia without coma: Secondary | ICD-10-CM

## 2017-12-16 DIAGNOSIS — Z713 Dietary counseling and surveillance: Secondary | ICD-10-CM | POA: Diagnosis not present

## 2017-12-16 DIAGNOSIS — E119 Type 2 diabetes mellitus without complications: Secondary | ICD-10-CM | POA: Insufficient documentation

## 2017-12-16 NOTE — Progress Notes (Signed)
Diabetes Self-Management Education  Visit Type: First/Initial  Appt. Start Time: 0935 Appt. End Time: 1035  12/16/2017  [name], identified by name and date of birth, is a 77 y.o. female with a diagnosis of Diabetes: Type 2.   ASSESSMENT Pt ambulates with walker with seat and prefers to sit in it. Patient was clearly in pain when moving into a standing position. Pt states she can't walk much due to back pain, but still aims to stay active doing chair exercises. Pt states she does not cook due to inability to stand very long. Lately Patient reports having meals from [employer], loves vegetables and will have her daughter bring her a variety. Pt states she is living with her daughter now but soon will be moving into senior independent living housing. Pt states they do not offer community dining and she will be on her own for meals.  Pt was referred to [facility] from the hospital after being admitted for fluid around her heart. Pt states she received education in the hospital about watching sudden weight gain and limited sodium intake. Patient stated some knowledge of how to reduce sodium and pt states she also reads nutrition facts labels, but wasn't sure how much she should have. RD reviewed some on sodium, but when learning about her hypoglycemic events focus on blood sugar control.   Pt states she had prior education and has been using primarily cheese and crackers for a snack. Pt also states she got burned out following the dietary advice of salad with grilled chicken. Pt appears to be proactive in doing research on her own and had some good understanding of T2DM prior to visit.   Pt states her most recent A1c is 8.5% and per Epic labs that is the same as last year. Pt states she is using insulin, Tresiba am, Novolog lunch & dinner. Pt states sometimes she will check BG around 7 pm after dinner and when it is on the lower end of target she will check again later to make sure it hasn't dropped too  low and recently it was 49 mg/dL. Pt states she does not have symptoms with low blood sugar. Pt reports FBG this morning was 67 mg/dL. RD believes she would benefit from getting in with her PCP to have her medication management checked (per patient PCP is L[facility]Donnie Coffin at [facility]). RD educated patient on the value of CGM due to her not having symptoms with hypoglycemia. Pt appears to understand and treating low blood sugar appropriately.  Pt states she feels she only gets 2 hours of sleep at night. Pt states she has tried medication and is did not work. Pt was provided handout on sleep tips, but may benefit from further evaluation with sleep medicine.  Diabetes Self-Management Education - 12/16/17 0950      Visit Information   Visit Type  First/Initial      Initial Visit   Diabetes Type  Type 2    Are you taking your medications as prescribed?  Yes    Date Diagnosed  15 yrs ago      Health Coping   How would you rate your overall health?  Very Poor      Psychosocial Assessment   Patient Belief/Attitude about Diabetes  Afraid    How often do you need to have someone help you when you read instructions, pamphlets, or other written materials from your doctor or pharmacy?  1 - Never    What is the last  grade level you completed in school?  12      Complications   Last HgB A1C per patient/outside source  8.5 %   per patient 3 months ag   How often do you check your blood sugar?  3-4 times/day    Fasting Blood glucose range (mg/dL)  70-129;130-179   103-150   Postprandial Blood glucose range (mg/dL)  --   130-160 after lunch 130-210 after dinner   Number of hypoglycemic episodes per month  3    Can you tell when your blood sugar is low?  No    Number of hyperglycemic episodes per week  3    Can you tell when your blood sugar is high?  No    Have you had a dilated eye exam in the past 12 months?  Yes    Have you had a dental exam in the past 12 months?  No    Are you checking  your feet?  Yes    How many days per week are you checking your feet?  5      Dietary Intake   Breakfast  [place] low sodium bacon, fruit, and applesauce or oatmeal or grits    Snack (morning)  none OR cheese & crackers    Lunch  [place] sandwich OR soup sometimes fries OR fish sticks OR grilled cheese sand & tomato soup Or salad with baked chicken    Snack (afternoon)  cheese & crackers OR fruit cups OR starkist snacks    Dinner  baked potato, string beans, salad    Snack (evening)  cheese & crackers    Beverage(s)  coffee, water, juice occassionally, hot tea splenda, lemon      Exercise   Exercise Type  Light (walking / raking leaves)    How many days per week to you exercise?  3    How many minutes per day do you exercise?  30    Total minutes per week of exercise  90      Patient Education   Previous Diabetes Education  Yes (please comment)   2 yrs ago   Nutrition management   Role of diet in the treatment of diabetes and the relationship between the three main macronutrients and blood glucose level;Food label reading, portion sizes and measuring food.    Physical activity and exercise   Role of exercise on diabetes management, blood pressure control and cardiac health.    Psychosocial adjustment  Role of stress on diabetes   sleep     Individualized Goals (developed by patient)   Nutrition  General guidelines for healthy choices and portions discussed    Physical Activity  Exercise 3-5 times per week    Monitoring   test my blood glucose as discussed      Outcomes   Expected Outcomes  Demonstrated interest in learning. Expect positive outcomes    Future DMSE  PRN    Program Status  Completed      Individualized Plan for Diabetes Self-Management Training:   Learning Objective:  Patient will have a greater understanding of diabetes self-management. Patient education plan is to attend individual and/or group sessions per assessed needs and concerns.  Patient Instructions   Aim to eat balanced meals and snacks, including protein whenever you have fruit, peanut butter is a good option. Consider eat more fish, 2-3x per week Continue to eat vegetables daily Look into the continuous glucose monitor [facility] brochure is one brand) Consider trying some the of the  sleep tips for better quality and quantity of sleep. Continue doing your exercises, and stay active daily. Consider making an appointment soon with your PCP to talk about your hypoglycemic events.   Expected Outcomes:  Demonstrated interest in learning. Expect positive outcomes  Education material provided: A1C conversion sheet and Snack sheet, sleep hygiene, how to treat low blood sugar (rule of 15)  If problems or questions, patient to contact team via:  Phone and MyChart  Future DSME appointment: PRN

## 2017-12-16 NOTE — Patient Instructions (Signed)
Aim to eat balanced meals and snacks, including protein whenever you have fruit, peanut butter is a good option. Consider eat more fish, 2-3x per week Continue to eat vegetables daily Look into the continuous glucose monitor [facility] brochure is one brand) Consider trying some the of the sleep tips for better quality and quantity of sleep. Continue doing your exercises, and stay active daily. Consider making an appointment soon with your PCP to talk about your hypoglycemic events.

## 2017-12-19 DIAGNOSIS — Z5181 Encounter for therapeutic drug level monitoring: Secondary | ICD-10-CM | POA: Diagnosis not present

## 2017-12-19 DIAGNOSIS — M259 Joint disorder, unspecified: Secondary | ICD-10-CM | POA: Diagnosis not present

## 2017-12-19 DIAGNOSIS — M545 Low back pain: Secondary | ICD-10-CM | POA: Diagnosis not present

## 2017-12-19 DIAGNOSIS — M5136 Other intervertebral disc degeneration, lumbar region: Secondary | ICD-10-CM | POA: Diagnosis not present

## 2017-12-22 ENCOUNTER — Inpatient Hospital Stay (HOSPITAL_COMMUNITY): Admission: RE | Admit: 2017-12-22 | Payer: PPO | Source: Ambulatory Visit

## 2017-12-27 ENCOUNTER — Encounter (HOSPITAL_COMMUNITY): Payer: PPO | Admitting: Cardiology

## 2018-01-05 ENCOUNTER — Telehealth (HOSPITAL_COMMUNITY): Payer: Self-pay | Admitting: Surgery

## 2018-01-05 ENCOUNTER — Encounter (HOSPITAL_COMMUNITY): Payer: Self-pay | Admitting: Surgery

## 2018-01-05 DIAGNOSIS — I5032 Chronic diastolic (congestive) heart failure: Secondary | ICD-10-CM

## 2018-01-05 NOTE — Telephone Encounter (Signed)
Order entered for Outpatient cardiac Rehab.

## 2018-01-09 ENCOUNTER — Ambulatory Visit (HOSPITAL_COMMUNITY)
Admission: RE | Admit: 2018-01-09 | Discharge: 2018-01-09 | Disposition: A | Discharge status: Home / Self Care | Payer: PPO | Source: Ambulatory Visit | Attending: Cardiology | Admitting: Cardiology

## 2018-01-09 ENCOUNTER — Other Ambulatory Visit: Payer: Self-pay

## 2018-01-09 ENCOUNTER — Encounter (HOSPITAL_COMMUNITY): Payer: Self-pay

## 2018-01-09 VITALS — BP 152/80 | HR 96 | Wt 207.2 lb

## 2018-01-09 DIAGNOSIS — I272 Pulmonary hypertension, unspecified: Secondary | ICD-10-CM | POA: Diagnosis not present

## 2018-01-09 DIAGNOSIS — I5022 Chronic systolic (congestive) heart failure: Secondary | ICD-10-CM | POA: Diagnosis not present

## 2018-01-09 DIAGNOSIS — E785 Hyperlipidemia, unspecified: Secondary | ICD-10-CM | POA: Diagnosis not present

## 2018-01-09 DIAGNOSIS — I2510 Atherosclerotic heart disease of native coronary artery without angina pectoris: Secondary | ICD-10-CM | POA: Insufficient documentation

## 2018-01-09 DIAGNOSIS — N183 Chronic kidney disease, stage 3 unspecified: Secondary | ICD-10-CM

## 2018-01-09 DIAGNOSIS — I130 Hypertensive heart and chronic kidney disease with heart failure and stage 1 through stage 4 chronic kidney disease, or unspecified chronic kidney disease: Secondary | ICD-10-CM | POA: Insufficient documentation

## 2018-01-09 DIAGNOSIS — J449 Chronic obstructive pulmonary disease, unspecified: Secondary | ICD-10-CM | POA: Diagnosis not present

## 2018-01-09 DIAGNOSIS — Z7901 Long term (current) use of anticoagulants: Secondary | ICD-10-CM | POA: Insufficient documentation

## 2018-01-09 DIAGNOSIS — I5032 Chronic diastolic (congestive) heart failure: Secondary | ICD-10-CM | POA: Insufficient documentation

## 2018-01-09 DIAGNOSIS — Z794 Long term (current) use of insulin: Secondary | ICD-10-CM | POA: Diagnosis not present

## 2018-01-09 DIAGNOSIS — E1122 Type 2 diabetes mellitus with diabetic chronic kidney disease: Secondary | ICD-10-CM | POA: Insufficient documentation

## 2018-01-09 DIAGNOSIS — N1330 Unspecified hydronephrosis: Secondary | ICD-10-CM | POA: Diagnosis not present

## 2018-01-09 DIAGNOSIS — N289 Disorder of kidney and ureter, unspecified: Secondary | ICD-10-CM

## 2018-01-09 DIAGNOSIS — I480 Paroxysmal atrial fibrillation: Secondary | ICD-10-CM | POA: Diagnosis not present

## 2018-01-09 DIAGNOSIS — Z79899 Other long term (current) drug therapy: Secondary | ICD-10-CM | POA: Diagnosis not present

## 2018-01-09 DIAGNOSIS — Z87891 Personal history of nicotine dependence: Secondary | ICD-10-CM | POA: Diagnosis not present

## 2018-01-09 DIAGNOSIS — I10 Essential (primary) hypertension: Secondary | ICD-10-CM

## 2018-01-09 MED ORDER — HYDRALAZINE HCL 100 MG PO TABS: 100.0000 mg | ORAL_TABLET | Freq: Two times a day (BID) | ORAL | 3 refills | Status: DC

## 2018-01-09 MED ORDER — TORSEMIDE 20 MG PO TABS: ORAL_TABLET | ORAL | 3 refills | Status: DC

## 2018-01-09 NOTE — Progress Notes (Signed)
Advanced Heart Failure Clinic Note  PCP: Alroy Dust, L.Marlou Sa, MD Cardiology: Dr. Wynonia Lawman HF Cardiology: Dr. Aundra Dubin  77 y.o.with history of CKD stage 3, paroxysmal atrial fibrillation, and chronic diastolic CHF was referred by Dr. Wynonia Lawman for evaluation of CHF.  RHC/LHC was done in 10/15.  This showed markedly elevated filling pressures and pulmonary venous hypertension.  There was no significant CAD.  Last echo in 2/18 showed EF 50-55%, mild LVH, grade 2 diastolic dysfunction. She is followed by Dr. Moshe Cipro for nephrology.   Admitted 10/2-10/6/19 with SOB. RHC showed elevated filling pressures and preserved CO. She was diuresed with IV lasix, then transitioned to torsemide 80 mg am, 40 mg pm. Repeat echo showed EF 45-50% with normal RV. PYP scan was negative for TTR amyloid. PFTs showed restrictive disease. CT chest showed no ILD but did show right hydronephrosis. Renal US showed single kidney with mild to moderate hydronephrosis on right (unchanged since 2011).  DC weight 208 lbs.   She presents today for regular follow up. Doing well overall. Weight down 5 lbs by our scale. Last visit instructed to take extra 40 mg torsemide. Daughter present. She remains SOB walking short distances, but can do most of ADLs. Abdominal bloating is slightly improved. She remains slightly orthopneic with chronic productive cough with clear/white sputum. No fevers or chills. Has significant UOP with am torsemide. Watching salt and fluid intake.   PYP scan negative for TTR amyloidosis.   Echo: EF 45-50%, septal-lateral dyssynchrony, normal RV.   RHC Procedural Findings: Hemodynamics (mmHg) RA mean 13 RV 68/14 PA 74/27, mean 50 PCWP mean 28 Oxygen saturations: PA 68% AO 97% Cardiac Output (Fick) 6.08  Cardiac Index (Fick) 3.12 PVR 3.6 WU  PFTs: Severe restriction, concern for interstitial process.   ECG (personally reviewed): NSR, left axis deviation, IVCD 122 msec, inferior Qs, old anterior  MI  Labs (2/18): K 4, creatinine 1.79  PMH: 1. Atrial fibrillation: Paroxysmal.  2. HTN 3. 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  5. CKD stage 3 6. H/o IVCD 7. COPD 8. Gout 9. Chronic primarily diastolic CHF: Echo (6/29) with EF 50-55%, mild LVH, grade 2 diastolic dysfunction.  - LHC/RHC (10/15): small distal LAD but no discrete stenosis.  Mean RA 15, PA 55/20 mean 34, mean PCWP 31 => pulmonary venous hypertension.  10. Sleep study 2016: Negative per patient.   Review of systems complete and found to be negative unless listed in HPI.    Social History   Socioeconomic History  . Marital status: Widowed    Spouse name: Not on file  . Number of children: 2  . Years of education: Not on file  . Highest education level: Not on file  Occupational History  . Occupation: retired  Scientific laboratory technician  . Financial resource strain: Not on file  . Food insecurity:    Worry: Not on file    Inability: Not on file  . Transportation needs:    Medical: Not on file    Non-medical: Not on file  Tobacco Use  . Smoking status: Former Smoker    Packs/day: 1.00    Years: 20.00    Pack years: 20.00    Types: Cigarettes    Last attempt to quit: 02/22/2009    Years since quitting: 8.8  . Smokeless tobacco: Never Used  Substance and Sexual Activity  . Alcohol use: No    Alcohol/week: 0.0 standard drinks    Comment: quit 2011  . Drug use: No  . Sexual  activity: Never  Lifestyle  . Physical activity:    Days per week: Not on file    Minutes per session: Not on file  . Stress: Not on file  Relationships  . Social connections:    Talks on phone: Not on file    Gets together: Not on file    Attends religious service: Not on file    Active member of club or organization: Not on file    Attends meetings of clubs or organizations: Not on file    Relationship status: Not on file  . Intimate partner violence:    Fear of current or ex partner: Not on file    Emotionally  abused: Not on file    Physically abused: Not on file    Forced sexual activity: Not on file  Other Topics Concern  . Not on file  Social History Narrative  . Not on file   Family History  Problem Relation Age of Onset  . Lupus Daughter   . Cancer Mother 74  . CVA Father 101  . Cancer Brother 45   Review of systems complete and found to be negative unless listed in HPI.    Current Outpatient Medications  Medication Sig Dispense Refill  . atorvastatin (LIPITOR) 80 MG tablet Take 80 mg by mouth daily.    . hydrALAZINE (APRESOLINE) 50 MG tablet Take 1.5 tablets (75 mg total) by mouth 2 (two) times daily. 90 tablet 3  . isosorbide mononitrate (IMDUR) 30 MG 24 hr tablet Take 1 tablet (30 mg total) by mouth daily. 30 tablet 3  . metoprolol succinate (TOPROL-XL) 25 MG 24 hr tablet Take 25 mg by mouth 2 (two) times daily.    . potassium chloride SA (K-DUR,KLOR-CON) 20 MEQ tablet Take 2 tablets (40 mEq total) by mouth daily. 60 tablet 5  . Rivaroxaban (XARELTO) 15 MG TABS tablet Take 1 tablet (15 mg total) by mouth daily with supper. 60 tablet 0  . torsemide (DEMADEX) 20 MG tablet Take 2-4 tablets (40-80 mg total) by mouth See admin instructions. Take 80 mg (4 Tablets) in the AM and 40 mg (2 Tablets) in the PM    . albuterol (PROVENTIL HFA;VENTOLIN HFA) 108 (90 BASE) MCG/ACT inhaler Inhale 2 puffs into the lungs every 4 (four) hours as needed for shortness of breath.     . calcitRIOL (ROCALTROL) 0.25 MCG capsule Take 0.25 mcg by mouth daily.    . fluticasone (CUTIVATE) 0.005 % ointment Apply 1 application topically 2 (two) times daily.  2  . guaiFENesin (MUCINEX) 600 MG 12 hr tablet Take 600 mg by mouth daily as needed for cough.    . insulin aspart (NOVOLOG) 100 UNIT/ML injection Inject 10 Units into the skin 2 (two) times daily before lunch and supper.     . insulin degludec (TRESIBA FLEXTOUCH) 100 UNIT/ML SOPN FlexTouch Pen Inject 40 Units into the skin every morning.     . latanoprost  (XALATAN) 0.005 % ophthalmic solution Place 1 drop into both eyes at bedtime.    Gean Birchwood ER 50 MG 12 hr tablet Take 50 mg by mouth every 12 (twelve) hours.  0  . repaglinide (PRANDIN) 0.5 MG tablet Take 0.5 mg by mouth 3 (three) times daily before meals.     . traMADol (ULTRAM) 50 MG tablet Take 50 mg by mouth every 6 (six) hours as needed for severe pain.     Marland Kitchen triamcinolone cream (KENALOG) 0.1 % Apply 1 application topically 2 (two) times  daily.     No current facility-administered medications for this encounter.    Today's Vitals   01/09/18 1501  BP: (!) 152/80  Pulse: 96  SpO2: 96%  Weight: 94 kg (207 lb 3.2 oz)   Body mass index is 37.9 kg/m. Wt Readings from Last 3 Encounters:  01/09/18 94 kg (207 lb 3.2 oz)  12/07/17 96.3 kg (212 lb 3.2 oz)  11/27/17 94.4 kg (208 lb 3.2 oz)   General: Elderly appearing. No resp difficulty. HEENT: Normal Neck: Supple. JVP 9-10 cm. Carotids 2+ bilat; no bruits. No thyromegaly or nodule noted. Cor: PMI nondisplaced. RRR, No M/G/R noted Lungs: CTAB, normal effort. Abdomen: Soft, non-tender, non-distended, no HSM. No bruits or masses. +BS  Extremities: No cyanosis, clubbing, or rash. Trace ankle edema.  Neuro: Alert & orientedx3, cranial nerves grossly intact. moves all 4 extremities w/o difficulty. Affect pleasant   Assessment/Plan: 1. Chronic diastolic CHF: Echo in 6/25 with EF 50-55%, mild LVH, grade 2 diastolic dysfunction. 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 Cardiac output preserved. Echo (10/19) with EF 45-50%, septal-lateral dyssynchrony, RV looks ok. NYHA class III-IIIb symptoms chronically.  - Volume status mildly elevated.  - Continue torsemide 80 mg /40 mg. Can take extra 40 mg torsemide as needed. Encouraged to do so today.  -  No spiro with CKD - LVH and diastolic CHF could be related to HTN, but amyloidosis also considered.Myeloma panel has been done and was negative.She cannot get contrast with MRI due to elevated creatinine.PYP scan negative for TTR amyloidosis. Doubt cardiac amyloidosis.  2. COPD: She no longer smokes. Restrictive PFTs. High rest CT negative for ILD. No change.  3. HTN:  - Increase hydralazine to 100 mg BID - Continue imdur 30 mg daily.  4. CKD: Stage 3.  BMET today. 5. Atrial fibrillation: Paroxysmal.   - Continue Xarelto 15 mg daily.  - Continue Toprol XL 25 mg BID - Regular on exam.  6. Pulmonary HTN: Moderate to severe pulmonary hypertension on RHC. PVR 3.6 WU. This appears to be primarily pulmonary venous hypertension though would be reasonable to repeat RHC after better diuresis. No change.  7. Restrictive PFTs: May be due in part to pulmonary edema but has now been diuresed.  High-res CT with no ILD but ? Right hydronephrosis. No change.  8. Right hydronephrosis - Renal US showed single kidney with mild to moderate hydronephrosis on right (unchanged since 2011). - Referred to urology   Keep appt for 2 months with Dr. Leo Rod, PA-C  01/09/2018   Greater than 50% of the 25 minute visit was spent in counseling/coordination of care regarding disease state education, salt/fluid restriction, sliding scale diuretics, and medication compliance.

## 2018-01-09 NOTE — Patient Instructions (Signed)
INCREASE Hydralazine to 100mg  (1 tab) Twice daily   MAY take additional 2 tabs of torsemide as needed. Your provider advises that you this additional 2 tabs today.  KEEP future appointment with Dr. Aundra Dubin

## 2018-01-11 ENCOUNTER — Telehealth (HOSPITAL_COMMUNITY): Payer: Self-pay

## 2018-01-11 NOTE — Telephone Encounter (Signed)
Faxed over Pulmonary Physician order to [place]. Once received patient can be contacted for scheduling.

## 2018-01-24 DIAGNOSIS — Z5181 Encounter for therapeutic drug level monitoring: Secondary | ICD-10-CM | POA: Diagnosis not present

## 2018-01-24 DIAGNOSIS — I10 Essential (primary) hypertension: Secondary | ICD-10-CM | POA: Diagnosis not present

## 2018-01-24 DIAGNOSIS — M545 Low back pain: Secondary | ICD-10-CM | POA: Diagnosis not present

## 2018-01-24 DIAGNOSIS — E1121 Type 2 diabetes mellitus with diabetic nephropathy: Secondary | ICD-10-CM | POA: Diagnosis not present

## 2018-01-24 DIAGNOSIS — J449 Chronic obstructive pulmonary disease, unspecified: Secondary | ICD-10-CM | POA: Diagnosis not present

## 2018-01-24 DIAGNOSIS — E7800 Pure hypercholesterolemia, unspecified: Secondary | ICD-10-CM | POA: Diagnosis not present

## 2018-01-24 DIAGNOSIS — M259 Joint disorder, unspecified: Secondary | ICD-10-CM | POA: Diagnosis not present

## 2018-01-24 DIAGNOSIS — R5383 Other fatigue: Secondary | ICD-10-CM | POA: Diagnosis not present

## 2018-01-24 DIAGNOSIS — M5136 Other intervertebral disc degeneration, lumbar region: Secondary | ICD-10-CM | POA: Diagnosis not present

## 2018-01-25 ENCOUNTER — Telehealth (HOSPITAL_COMMUNITY): Payer: Self-pay | Admitting: *Deleted

## 2018-01-25 NOTE — Telephone Encounter (Signed)
Received requested pulmonary rehab order from Dr. Aundra Dubin for Diastolic CHF.  Pt is appropriate for support staff to verify insurance, contact pt for scheduling. Cherre Huger, BSN Cardiac and Training and development officer

## 2018-01-31 ENCOUNTER — Telehealth (HOSPITAL_COMMUNITY): Payer: Self-pay

## 2018-01-31 NOTE — Telephone Encounter (Signed)
Pt insurance is active and benefits verified through [employer]. Co-pay $15.00, DED $0.00/$0.00 met, out of pocket $3,400.00/$1,675.00 met, co-insurance 0%. No pre-authorization required. [place]/[employer], 01/31/18 @ 2:21PM, REF# [id number]

## 2018-01-31 NOTE — Telephone Encounter (Signed)
Attempted to call patient in regards to Pulmonary Rehab - LM on VM °

## 2018-02-09 ENCOUNTER — Encounter (HOSPITAL_COMMUNITY): Payer: Self-pay

## 2018-02-09 NOTE — Telephone Encounter (Signed)
Attempted to call pt a 2nd time- LM ON VM ° °Mailed letter out °

## 2018-02-13 DIAGNOSIS — Z5181 Encounter for therapeutic drug level monitoring: Secondary | ICD-10-CM | POA: Diagnosis not present

## 2018-02-13 DIAGNOSIS — M545 Low back pain: Secondary | ICD-10-CM | POA: Diagnosis not present

## 2018-02-13 DIAGNOSIS — M259 Joint disorder, unspecified: Secondary | ICD-10-CM | POA: Diagnosis not present

## 2018-02-13 DIAGNOSIS — M5136 Other intervertebral disc degeneration, lumbar region: Secondary | ICD-10-CM | POA: Diagnosis not present

## 2018-03-08 ENCOUNTER — Telehealth (HOSPITAL_COMMUNITY): Payer: Self-pay

## 2018-03-08 NOTE — Telephone Encounter (Signed)
Attempted to contact pt a 3rd time. LM ON VM

## 2018-03-14 ENCOUNTER — Encounter (HOSPITAL_COMMUNITY): Payer: Self-pay | Admitting: Cardiology

## 2018-03-14 ENCOUNTER — Ambulatory Visit (HOSPITAL_COMMUNITY)
Admission: RE | Admit: 2018-03-14 | Discharge: 2018-03-14 | Disposition: A | Discharge status: Home / Self Care | Payer: PPO | Source: Ambulatory Visit | Attending: Cardiology | Admitting: Cardiology

## 2018-03-14 VITALS — BP 138/58 | HR 88 | Wt 209.2 lb

## 2018-03-14 DIAGNOSIS — I130 Hypertensive heart and chronic kidney disease with heart failure and stage 1 through stage 4 chronic kidney disease, or unspecified chronic kidney disease: Secondary | ICD-10-CM | POA: Insufficient documentation

## 2018-03-14 DIAGNOSIS — E1122 Type 2 diabetes mellitus with diabetic chronic kidney disease: Secondary | ICD-10-CM | POA: Diagnosis not present

## 2018-03-14 DIAGNOSIS — I5022 Chronic systolic (congestive) heart failure: Secondary | ICD-10-CM | POA: Diagnosis not present

## 2018-03-14 DIAGNOSIS — N183 Chronic kidney disease, stage 3 unspecified: Secondary | ICD-10-CM

## 2018-03-14 DIAGNOSIS — I480 Paroxysmal atrial fibrillation: Secondary | ICD-10-CM | POA: Diagnosis not present

## 2018-03-14 DIAGNOSIS — Z87891 Personal history of nicotine dependence: Secondary | ICD-10-CM | POA: Diagnosis not present

## 2018-03-14 DIAGNOSIS — J449 Chronic obstructive pulmonary disease, unspecified: Secondary | ICD-10-CM | POA: Diagnosis not present

## 2018-03-14 DIAGNOSIS — Z794 Long term (current) use of insulin: Secondary | ICD-10-CM | POA: Insufficient documentation

## 2018-03-14 DIAGNOSIS — I272 Pulmonary hypertension, unspecified: Secondary | ICD-10-CM | POA: Insufficient documentation

## 2018-03-14 DIAGNOSIS — I2510 Atherosclerotic heart disease of native coronary artery without angina pectoris: Secondary | ICD-10-CM | POA: Insufficient documentation

## 2018-03-14 DIAGNOSIS — I5032 Chronic diastolic (congestive) heart failure: Secondary | ICD-10-CM | POA: Insufficient documentation

## 2018-03-14 DIAGNOSIS — N1330 Unspecified hydronephrosis: Secondary | ICD-10-CM | POA: Diagnosis not present

## 2018-03-14 DIAGNOSIS — E785 Hyperlipidemia, unspecified: Secondary | ICD-10-CM | POA: Insufficient documentation

## 2018-03-14 DIAGNOSIS — Z7901 Long term (current) use of anticoagulants: Secondary | ICD-10-CM | POA: Insufficient documentation

## 2018-03-14 DIAGNOSIS — R9431 Abnormal electrocardiogram [ECG] [EKG]: Secondary | ICD-10-CM | POA: Diagnosis not present

## 2018-03-14 DIAGNOSIS — Z79899 Other long term (current) drug therapy: Secondary | ICD-10-CM | POA: Diagnosis not present

## 2018-03-14 LAB — BASIC METABOLIC PANEL
Anion gap: 10 (ref 5–15)
BUN: 57 mg/dL — ABNORMAL HIGH (ref 8–23)
CO2: 24 mmol/L (ref 22–32)
Calcium: 8.5 mg/dL — ABNORMAL LOW (ref 8.9–10.3)
Chloride: 107 mmol/L (ref 98–111)
Creatinine, Ser: 2.56 mg/dL — ABNORMAL HIGH (ref 0.44–1.00)
GFR calc Af Amer: 20 mL/min — ABNORMAL LOW (ref >60)
GFR calc non Af Amer: 18 mL/min — ABNORMAL LOW (ref >60)
Glucose, Bld: 172 mg/dL — ABNORMAL HIGH (ref 70–99)
Potassium: 4.5 mmol/L (ref 3.5–5.1)
Sodium: 141 mmol/L (ref 135–145)

## 2018-03-14 MED ORDER — METOLAZONE 2.5 MG PO TABS: ORAL_TABLET | ORAL | 0 refills | Status: DC

## 2018-03-14 MED ORDER — TORSEMIDE 20 MG PO TABS: 80.0000 mg | ORAL_TABLET | Freq: Two times a day (BID) | ORAL | 3 refills | Status: DC

## 2018-03-14 NOTE — Patient Instructions (Signed)
Take Metolazone 2.5mg  (1 tablet) today. Please take an extra 32meq (1 tablet) of Potassium when you take Metolazone.  INCREASE Torsemide to 80mg  (4 tablets) twice daily.  Routine lab work today. Will notify you of abnormal results  Follow up and repeat labs in 1 week.

## 2018-03-14 NOTE — Progress Notes (Signed)
Advanced Heart Failure Clinic Note  PCP: Alroy Dust, L.Marlou Sa, MD HF Cardiology: Dr. Aundra Dubin  78 y.o.with history of CKD stage 3, paroxysmal atrial fibrillation, and chronic diastolic CHF was referred by Dr. Wynonia Lawman for evaluation of CHF.  RHC/LHC was done in 10/15.  This showed markedly elevated filling pressures and pulmonary venous hypertension.  There was no significant CAD.  Last echo in 2/18 showed EF 50-55%, mild LVH, grade 2 diastolic dysfunction. She is followed by Dr. Moshe Cipro for nephrology.   Admitted 10/2-10/6/19 with SOB. RHC showed elevated filling pressures and preserved CO. She was diuresed with IV lasix, then transitioned to torsemide 80 mg am, 40 mg pm. Repeat echo showed EF 45-50% with normal RV. PYP scan was negative for TTR amyloid. PFTs showed restrictive disease. CT chest showed no ILD but did show right hydronephrosis. Renal US showed single kidney with mild to moderate hydronephrosis on right (unchanged since 2011).  DC weight 208 lbs.   She presents today for followup of CHF.  Her breathing has been worse for about 1 week.  Weight is up 2 lbs.  She is short of breath walking only short distances.  Her chest feels heavy when she gets short of breath.  She sleeps on 1 pillow but it is large. Has orthopnea, cannot lie back on a regular-sized pillow.  No lightheadedness or palpitations.   ECG (personally reviewed): NSR, IVCD  Labs (2/18): K 4, creatinine 1.79 Labs (10/19): K 4.5, creatinine 2.4 Labs (12/19): LDL 62  PMH: 1. Atrial fibrillation: Paroxysmal.  2. HTN 3. 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  5. CKD stage 3 6. H/o IVCD 7. COPD: PFTs (10/19) actually showed severe restriction, concern for interstitial process.  CT chest (10/19) showed mild-moderate patchy air trapping but no evidence for interstitial lung disease.  8. Gout 9. Chronic primarily diastolic CHF: Echo (1/61) with EF 50-55%, mild LVH, grade 2 diastolic dysfunction.  -  LHC/RHC (10/15): small distal LAD but no discrete stenosis.  Mean RA 15, PA 55/20 mean 34, mean PCWP 31 => pulmonary venous hypertension.  - PYP scan (10/19): No evidence for TTR amyloidosis.  - Echo (10/19): EF 45-50%, mild LVH, normal RV size and systolic function.  - RHC (10/19): mean RA 13, PA 74/27 mean 50, mean PCWP 28, CI 3.12, PVR 3.6 WU. Pulmonary venous hypertension.  10. Sleep study 2016: Negative per patient.   Review of systems complete and found to be negative unless listed in HPI.    Social History   Socioeconomic History  . Marital status: Widowed    Spouse name: Not on file  . Number of children: 2  . Years of education: Not on file  . Highest education level: Not on file  Occupational History  . Occupation: retired  Scientific laboratory technician  . Financial resource strain: Not on file  . Food insecurity:    Worry: Not on file    Inability: Not on file  . Transportation needs:    Medical: Not on file    Non-medical: Not on file  Tobacco Use  . Smoking status: Former Smoker    Packs/day: 1.00    Years: 20.00    Pack years: 20.00    Types: Cigarettes    Last attempt to quit: 02/22/2009    Years since quitting: 9.0  . Smokeless tobacco: Never Used  Substance and Sexual Activity  . Alcohol use: No    Alcohol/week: 0.0 standard drinks    Comment: quit 2011  . Drug  use: No  . Sexual activity: Never  Lifestyle  . Physical activity:    Days per week: Not on file    Minutes per session: Not on file  . Stress: Not on file  Relationships  . Social connections:    Talks on phone: Not on file    Gets together: Not on file    Attends religious service: Not on file    Active member of club or organization: Not on file    Attends meetings of clubs or organizations: Not on file    Relationship status: Not on file  . Intimate partner violence:    Fear of current or ex partner: Not on file    Emotionally abused: Not on file    Physically abused: Not on file    Forced sexual  activity: Not on file  Other Topics Concern  . Not on file  Social History Narrative  . Not on file   Family History  Problem Relation Age of Onset  . Lupus Daughter   . Cancer Mother 89  . CVA Father 29  . Cancer Brother 45   Review of systems complete and found to be negative unless listed in HPI.    Current Outpatient Medications  Medication Sig Dispense Refill  . albuterol (PROVENTIL HFA;VENTOLIN HFA) 108 (90 BASE) MCG/ACT inhaler Inhale 2 puffs into the lungs every 4 (four) hours as needed for shortness of breath.     Marland Kitchen atorvastatin (LIPITOR) 80 MG tablet Take 80 mg by mouth daily.    . calcitRIOL (ROCALTROL) 0.25 MCG capsule Take 0.25 mcg by mouth daily.    . fluticasone (CUTIVATE) 0.005 % ointment Apply 1 application topically 2 (two) times daily.  2  . guaiFENesin (MUCINEX) 600 MG 12 hr tablet Take 600 mg by mouth daily as needed for cough.    . hydrALAZINE (APRESOLINE) 100 MG tablet Take 1 tablet (100 mg total) by mouth 2 (two) times daily. 60 tablet 3  . insulin aspart (NOVOLOG) 100 UNIT/ML injection Inject 10 Units into the skin 2 (two) times daily before lunch and supper.     . insulin degludec (TRESIBA FLEXTOUCH) 100 UNIT/ML SOPN FlexTouch Pen Inject 40 Units into the skin every morning.     . isosorbide mononitrate (IMDUR) 30 MG 24 hr tablet Take 1 tablet (30 mg total) by mouth daily. 30 tablet 3  . latanoprost (XALATAN) 0.005 % ophthalmic solution Place 1 drop into both eyes at bedtime.    . metoprolol succinate (TOPROL-XL) 25 MG 24 hr tablet Take 25 mg by mouth 2 (two) times daily.    Gean Birchwood ER 50 MG 12 hr tablet Take 50 mg by mouth every 12 (twelve) hours.  0  . potassium chloride SA (K-DUR,KLOR-CON) 20 MEQ tablet Take 2 tablets (40 mEq total) by mouth daily. 60 tablet 5  . repaglinide (PRANDIN) 0.5 MG tablet Take 0.5 mg by mouth 3 (three) times daily before meals.     . Rivaroxaban (XARELTO) 15 MG TABS tablet Take 1 tablet (15 mg total) by mouth daily with supper.  60 tablet 0  . torsemide (DEMADEX) 20 MG tablet Take 4 tablets (80 mg total) by mouth 2 (two) times daily. Take an additional 40mg  (2 tabs) as needed 240 tablet 3  . traMADol (ULTRAM) 50 MG tablet Take 50 mg by mouth every 6 (six) hours as needed for severe pain.     Marland Kitchen triamcinolone cream (KENALOG) 0.1 % Apply 1 application topically 2 (two) times daily.    Marland Kitchen  metolazone (ZAROXOLYN) 2.5 MG tablet Take ONLY as directed by CHF clinic. 5 tablet 0   No current facility-administered medications for this encounter.    Today's Vitals   03/14/18 1011  BP: (!) 138/58  Pulse: 88  SpO2: 97%  Weight: 94.9 kg (209 lb 3.2 oz)   Body mass index is 38.26 kg/m. Wt Readings from Last 3 Encounters:  03/14/18 94.9 kg (209 lb 3.2 oz)  01/09/18 94 kg (207 lb 3.2 oz)  12/07/17 96.3 kg (212 lb 3.2 oz)   General: NAD Neck: Thick, JVP 8-9 cm with HJR, no thyromegaly or thyroid nodule.  Lungs: Clear to auscultation bilaterally with normal respiratory effort. CV: Nondisplaced PMI.  Heart regular S1/S2, no S3/S4, no murmur.  1+ ankle edema.  No carotid bruit.  Normal pedal pulses.  Abdomen: Soft, nontender, no hepatosplenomegaly, no distention.  Skin: Intact without lesions or rashes.  Neurologic: Alert and oriented x 3.  Psych: Normal affect. Extremities: No clubbing or cyanosis.  HEENT: Normal.   Assessment/Plan: 1. Chronic diastolic CHF: Echo in 6/54 with EF 50-55%, mild LVH, grade 2 diastolic dysfunction. 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 Cardiac output preserved. Echo (10/19) with EF 45-50%, septal-lateral dyssynchrony, RV looks ok.  PYP scan was not suggestive of transthyretin amyloidosis.  NYHA class IIIb symptoms.  She is volume overloaded on exam.  This is complicated by CKD stage 3.  - Increase  torsemide to 80 mg bid.  BMET today and again in 1 week.  - I will have her take metolazone 2.5 mg x 1 with next dose of torsemide.  She will take an extra 20 mEq KCl with metolazone.  - No spironolactone with CKD.  - I think she would be a good candidate for Cardiomems, will try to get insurance approval.  2. COPD: She no longer smokes. Restrictive PFTs. High rest CT negative for ILD.  3. HTN: BP controlled.  4. CKD: Stage 3.  BMET today. 5. Atrial fibrillation: Paroxysmal.  She is in NSR today.  - Continue Xarelto 15 mg daily.  - Continue Toprol XL 25 mg BID 6. Pulmonary HTN: Moderate to severe pulmonary hypertension on 10/19 RHC. PVR 3.6 WU. This appears to be primarily pulmonary venous hypertension.  7. Right hydronephrosis: Renal US showed single kidney with mild to moderate hydronephrosis on right (unchanged since 2011). 8. CKD: Stage 3.  Follow creatinine closely with med changes.    Followup next with with NP/PA to reassess CHF.   Loralie Champagne, MD  03/14/2018

## 2018-03-17 NOTE — Progress Notes (Signed)
Advanced Heart Failure Clinic Note  PCP: Alroy Dust, L.Marlou Sa, MD HF Cardiology: Dr. Aundra Dubin  78 y.o.with history of CKD stage 3, paroxysmal atrial fibrillation, and chronic diastolic CHF was referred by Dr. Wynonia Lawman for evaluation of CHF.  RHC/LHC was done in 10/15.  This showed markedly elevated filling pressures and pulmonary venous hypertension.  There was no significant CAD.  Last echo in 2/18 showed EF 50-55%, mild LVH, grade 2 diastolic dysfunction. She is followed by Dr. Moshe Cipro for nephrology.   Admitted 10/2-10/6/19 with SOB. RHC showed elevated filling pressures and preserved CO. She was diuresed with IV lasix, then transitioned to torsemide 80 mg am, 40 mg pm. Repeat echo showed EF 45-50% with normal RV. PYP scan was negative for TTR amyloid. PFTs showed restrictive disease. CT chest showed no ILD but did show right hydronephrosis. Renal US showed single kidney with mild to moderate hydronephrosis on right (unchanged since 2011).  DC weight 208 lbs.   She returns for HF follow up. Last visit torsemide was increased to 80 mg twice a day. Earlier today she saw Dr Moshe Cipro   Overall feeling fine. SOB with exertion. Denies PND/Orthopnea. Uses rolling walker. Appetite poor. No fever or chills. Weight at home 206 pounds. Taking all medications and just took morning medications right before this visit.    Labs (2/18): K 4, creatinine 1.79 Labs (10/19): K 4.5, creatinine 2.4 Labs (12/19): LDL 62  PMH: 1. Atrial fibrillation: Paroxysmal.  2. HTN 3. 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  5. CKD stage 3 6. H/o IVCD 7. COPD: PFTs (10/19) actually showed severe restriction, concern for interstitial process.  CT chest (10/19) showed mild-moderate patchy air trapping but no evidence for interstitial lung disease.  8. Gout 9. Chronic primarily diastolic CHF: Echo (5/39) with EF 50-55%, mild LVH, grade 2 diastolic dysfunction.  - LHC/RHC (10/15): small distal LAD but no  discrete stenosis.  Mean RA 15, PA 55/20 mean 34, mean PCWP 31 => pulmonary venous hypertension.  - PYP scan (10/19): No evidence for TTR amyloidosis.  - Echo (10/19): EF 45-50%, mild LVH, normal RV size and systolic function.  - RHC (10/19): mean RA 13, PA 74/27 mean 50, mean PCWP 28, CI 3.12, PVR 3.6 WU. Pulmonary venous hypertension.  10. Sleep study 2016: Negative per patient.   Review of systems complete and found to be negative unless listed in HPI.    Social History   Socioeconomic History  . Marital status: Widowed    Spouse name: Not on file  . Number of children: 2  . Years of education: Not on file  . Highest education level: Not on file  Occupational History  . Occupation: retired  Scientific laboratory technician  . Financial resource strain: Not on file  . Food insecurity:    Worry: Not on file    Inability: Not on file  . Transportation needs:    Medical: Not on file    Non-medical: Not on file  Tobacco Use  . Smoking status: Former Smoker    Packs/day: 1.00    Years: 20.00    Pack years: 20.00    Types: Cigarettes    Last attempt to quit: 02/22/2009    Years since quitting: 9.0  . Smokeless tobacco: Never Used  Substance and Sexual Activity  . Alcohol use: No    Alcohol/week: 0.0 standard drinks    Comment: quit 2011  . Drug use: No  . Sexual activity: Never  Lifestyle  . Physical activity:  Days per week: Not on file    Minutes per session: Not on file  . Stress: Not on file  Relationships  . Social connections:    Talks on phone: Not on file    Gets together: Not on file    Attends religious service: Not on file    Active member of club or organization: Not on file    Attends meetings of clubs or organizations: Not on file    Relationship status: Not on file  . Intimate partner violence:    Fear of current or ex partner: Not on file    Emotionally abused: Not on file    Physically abused: Not on file    Forced sexual activity: Not on file  Other Topics Concern   . Not on file  Social History Narrative  . Not on file   Family History  Problem Relation Age of Onset  . Lupus Daughter   . Cancer Mother 10  . CVA Father 66  . Cancer Brother 45   Review of systems complete and found to be negative unless listed in HPI.    Current Outpatient Medications  Medication Sig Dispense Refill  . albuterol (PROVENTIL HFA;VENTOLIN HFA) 108 (90 BASE) MCG/ACT inhaler Inhale 2 puffs into the lungs every 4 (four) hours as needed for shortness of breath.     Marland Kitchen atorvastatin (LIPITOR) 80 MG tablet Take 80 mg by mouth daily.    . calcitRIOL (ROCALTROL) 0.25 MCG capsule Take 0.25 mcg by mouth daily.    . fluticasone (CUTIVATE) 0.005 % ointment Apply 1 application topically 2 (two) times daily.  2  . guaiFENesin (MUCINEX) 600 MG 12 hr tablet Take 600 mg by mouth daily as needed for cough.    . hydrALAZINE (APRESOLINE) 100 MG tablet Take 1 tablet (100 mg total) by mouth 2 (two) times daily. 60 tablet 3  . insulin aspart (NOVOLOG) 100 UNIT/ML injection Inject 10 Units into the skin 2 (two) times daily before lunch and supper.     . insulin degludec (TRESIBA FLEXTOUCH) 100 UNIT/ML SOPN FlexTouch Pen Inject 40 Units into the skin every morning.     . isosorbide mononitrate (IMDUR) 30 MG 24 hr tablet Take 1 tablet (30 mg total) by mouth daily. 30 tablet 3  . latanoprost (XALATAN) 0.005 % ophthalmic solution Place 1 drop into both eyes at bedtime.    . metoprolol succinate (TOPROL-XL) 25 MG 24 hr tablet Take 25 mg by mouth 2 (two) times daily.    Gean Birchwood ER 50 MG 12 hr tablet Take 50 mg by mouth every 12 (twelve) hours.  0  . potassium chloride SA (K-DUR,KLOR-CON) 20 MEQ tablet Take 2 tablets (40 mEq total) by mouth daily. 60 tablet 5  . repaglinide (PRANDIN) 0.5 MG tablet Take 0.5 mg by mouth 3 (three) times daily before meals.     . Rivaroxaban (XARELTO) 15 MG TABS tablet Take 1 tablet (15 mg total) by mouth daily with supper. 60 tablet 0  . traMADol (ULTRAM) 50 MG  tablet Take 50 mg by mouth every 6 (six) hours as needed for severe pain.     . metolazone (ZAROXOLYN) 2.5 MG tablet Take ONLY as directed by CHF clinic. (Patient not taking: Reported on 03/21/2018) 5 tablet 0  . torsemide (DEMADEX) 20 MG tablet Take 4 tablets (80 mg total) by mouth 2 (two) times daily. Take an additional 40mg  (2 tabs) as needed 240 tablet 3  . triamcinolone cream (KENALOG) 0.1 % Apply  1 application topically 2 (two) times daily.     No current facility-administered medications for this encounter.    Today's Vitals   03/21/18 1119  BP: (!) 162/68  Pulse: 85  SpO2: 95%  Weight: 93.4 kg (206 lb)   Body mass index is 37.68 kg/m. Wt Readings from Last 3 Encounters:  03/21/18 93.4 kg (206 lb)  03/14/18 94.9 kg (209 lb 3.2 oz)  01/09/18 94 kg (207 lb 3.2 oz)   General:   No resp difficulty HEENT: normal Neck: supple. JVP 8-9 Carotids 2+ bilat; no bruits. No lymphadenopathy or thryomegaly appreciated. Cor: PMI nondisplaced. Regular rate & rhythm. No rubs, gallops or murmurs. Lungs: clear Abdomen: soft, nontender, nondistended. No hepatosplenomegaly. No bruits or masses. Good bowel sounds. Extremities: no cyanosis, clubbing, rash, edema Neuro: alert & orientedx3, cranial nerves grossly intact. moves all 4 extremities w/o difficulty. Affect pleasant  Assessment/Plan: 1. Chronic diastolic CHF: Echo in 1/61 with EF 50-55%, mild LVH, grade 2 diastolic dysfunction. 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 Cardiac output preserved. Echo (10/19) with EF 45-50%, septal-lateral dyssynchrony, RV looks ok.  PYP scan was not suggestive of transthyretin amyloidosis.   - NYHA III. Volume status stable. Continue torsemide to 80 mg bid.   -- No spironolactone with CKD.  - Discussed cardiomems.   She signed consent and was provided booklet on Cardiomems.  2. COPD: She no longer smokes. Restrictive PFTs. High rest CT negative for ILD.  3. HTN: Elevated but she just took her medications. No change.  4. CKD: Stage 3.   BMET today. Forward results to Dr Moshe Cipro. insturcted to avoid NSAIDs.  May need dialysis soon.  5. Atrial fibrillation: Paroxysmal.   - Continue Xarelto 15 mg daily.  - Continue Toprol XL 25 mg BID 6. Pulmonary HTN: Moderate to severe pulmonary hypertension on 10/19 RHC. PVR 3.6 WU. This appears to be primarily pulmonary venous hypertension.  7. Right hydronephrosis: Renal US showed single kidney with mild to moderate hydronephrosis on right (unchanged since 2011).    Follow up with Dr Aundra Dubin in 6 weeks. If cardiomems approved we will consider implanting. She is already on xarelto so she would not need plavix or aspirin.  Greater than 50% of the (total minutes 25) visit spent in counseling/coordination of care regarding.    Darrick Grinder, NP  03/21/2018

## 2018-03-21 ENCOUNTER — Other Ambulatory Visit: Payer: Self-pay

## 2018-03-21 ENCOUNTER — Encounter (HOSPITAL_COMMUNITY): Payer: Self-pay

## 2018-03-21 ENCOUNTER — Ambulatory Visit (HOSPITAL_COMMUNITY)
Admission: RE | Admit: 2018-03-21 | Discharge: 2018-03-21 | Disposition: A | Discharge status: Home / Self Care | Payer: PPO | Source: Ambulatory Visit | Attending: Internal Medicine | Admitting: Internal Medicine

## 2018-03-21 VITALS — BP 162/68 | HR 85 | Wt 206.0 lb

## 2018-03-21 DIAGNOSIS — I509 Heart failure, unspecified: Secondary | ICD-10-CM

## 2018-03-21 DIAGNOSIS — R0600 Dyspnea, unspecified: Secondary | ICD-10-CM | POA: Diagnosis not present

## 2018-03-21 DIAGNOSIS — E785 Hyperlipidemia, unspecified: Secondary | ICD-10-CM | POA: Diagnosis not present

## 2018-03-21 DIAGNOSIS — Z809 Family history of malignant neoplasm, unspecified: Secondary | ICD-10-CM | POA: Insufficient documentation

## 2018-03-21 DIAGNOSIS — E1122 Type 2 diabetes mellitus with diabetic chronic kidney disease: Secondary | ICD-10-CM | POA: Diagnosis not present

## 2018-03-21 DIAGNOSIS — I129 Hypertensive chronic kidney disease with stage 1 through stage 4 chronic kidney disease, or unspecified chronic kidney disease: Secondary | ICD-10-CM | POA: Diagnosis not present

## 2018-03-21 DIAGNOSIS — D631 Anemia in chronic kidney disease: Secondary | ICD-10-CM | POA: Diagnosis not present

## 2018-03-21 DIAGNOSIS — I130 Hypertensive heart and chronic kidney disease with heart failure and stage 1 through stage 4 chronic kidney disease, or unspecified chronic kidney disease: Secondary | ICD-10-CM | POA: Diagnosis present

## 2018-03-21 DIAGNOSIS — N183 Chronic kidney disease, stage 3 unspecified: Secondary | ICD-10-CM

## 2018-03-21 DIAGNOSIS — Z7901 Long term (current) use of anticoagulants: Secondary | ICD-10-CM | POA: Diagnosis not present

## 2018-03-21 DIAGNOSIS — I272 Pulmonary hypertension, unspecified: Secondary | ICD-10-CM | POA: Diagnosis not present

## 2018-03-21 DIAGNOSIS — Z794 Long term (current) use of insulin: Secondary | ICD-10-CM | POA: Diagnosis not present

## 2018-03-21 DIAGNOSIS — I5022 Chronic systolic (congestive) heart failure: Secondary | ICD-10-CM | POA: Diagnosis not present

## 2018-03-21 DIAGNOSIS — I480 Paroxysmal atrial fibrillation: Secondary | ICD-10-CM | POA: Diagnosis not present

## 2018-03-21 DIAGNOSIS — Z87891 Personal history of nicotine dependence: Secondary | ICD-10-CM | POA: Insufficient documentation

## 2018-03-21 DIAGNOSIS — Z79899 Other long term (current) drug therapy: Secondary | ICD-10-CM | POA: Insufficient documentation

## 2018-03-21 DIAGNOSIS — I5032 Chronic diastolic (congestive) heart failure: Secondary | ICD-10-CM | POA: Diagnosis present

## 2018-03-21 DIAGNOSIS — J449 Chronic obstructive pulmonary disease, unspecified: Secondary | ICD-10-CM | POA: Insufficient documentation

## 2018-03-21 DIAGNOSIS — I2510 Atherosclerotic heart disease of native coronary artery without angina pectoris: Secondary | ICD-10-CM | POA: Insufficient documentation

## 2018-03-21 DIAGNOSIS — E669 Obesity, unspecified: Secondary | ICD-10-CM | POA: Diagnosis not present

## 2018-03-21 DIAGNOSIS — N179 Acute kidney failure, unspecified: Secondary | ICD-10-CM | POA: Diagnosis not present

## 2018-03-21 DIAGNOSIS — N1330 Unspecified hydronephrosis: Secondary | ICD-10-CM | POA: Insufficient documentation

## 2018-03-21 DIAGNOSIS — E875 Hyperkalemia: Secondary | ICD-10-CM | POA: Diagnosis not present

## 2018-03-21 LAB — BASIC METABOLIC PANEL
Anion gap: 13 (ref 5–15)
BUN: 73 mg/dL — ABNORMAL HIGH (ref 8–23)
CO2: 27 mmol/L (ref 22–32)
Calcium: 8.2 mg/dL — ABNORMAL LOW (ref 8.9–10.3)
Chloride: 97 mmol/L — ABNORMAL LOW (ref 98–111)
Creatinine, Ser: 2.72 mg/dL — ABNORMAL HIGH (ref 0.44–1.00)
GFR calc Af Amer: 19 mL/min — ABNORMAL LOW (ref >60)
GFR calc non Af Amer: 16 mL/min — ABNORMAL LOW (ref >60)
Glucose, Bld: 186 mg/dL — ABNORMAL HIGH (ref 70–99)
Potassium: 3.9 mmol/L (ref 3.5–5.1)
Sodium: 137 mmol/L (ref 135–145)

## 2018-03-21 NOTE — Patient Instructions (Signed)
Labs done today. We will notify you of any abnormal labs.  Follow up with Dr. Aundra Dubin in 6 weeks.

## 2018-03-23 NOTE — Telephone Encounter (Signed)
No response from pt.  Closed referral  

## 2018-03-28 ENCOUNTER — Other Ambulatory Visit (HOSPITAL_COMMUNITY): Payer: Self-pay | Admitting: Cardiology

## 2018-05-03 ENCOUNTER — Ambulatory Visit (HOSPITAL_COMMUNITY)
Admission: RE | Admit: 2018-05-03 | Discharge: 2018-05-03 | Disposition: A | Discharge status: Home / Self Care | Payer: PPO | Source: Ambulatory Visit | Attending: Cardiology | Admitting: Cardiology

## 2018-05-03 ENCOUNTER — Other Ambulatory Visit: Payer: Self-pay

## 2018-05-03 ENCOUNTER — Encounter (HOSPITAL_COMMUNITY): Payer: Self-pay | Admitting: Cardiology

## 2018-05-03 VITALS — BP 148/60 | HR 69 | Wt 208.2 lb

## 2018-05-03 DIAGNOSIS — N183 Chronic kidney disease, stage 3 unspecified: Secondary | ICD-10-CM

## 2018-05-03 DIAGNOSIS — Z794 Long term (current) use of insulin: Secondary | ICD-10-CM | POA: Diagnosis not present

## 2018-05-03 DIAGNOSIS — I2510 Atherosclerotic heart disease of native coronary artery without angina pectoris: Secondary | ICD-10-CM | POA: Diagnosis not present

## 2018-05-03 DIAGNOSIS — I5032 Chronic diastolic (congestive) heart failure: Secondary | ICD-10-CM | POA: Diagnosis present

## 2018-05-03 DIAGNOSIS — Z87891 Personal history of nicotine dependence: Secondary | ICD-10-CM | POA: Diagnosis not present

## 2018-05-03 DIAGNOSIS — I5022 Chronic systolic (congestive) heart failure: Secondary | ICD-10-CM

## 2018-05-03 DIAGNOSIS — N1330 Unspecified hydronephrosis: Secondary | ICD-10-CM | POA: Diagnosis not present

## 2018-05-03 DIAGNOSIS — I272 Pulmonary hypertension, unspecified: Secondary | ICD-10-CM | POA: Diagnosis not present

## 2018-05-03 DIAGNOSIS — J449 Chronic obstructive pulmonary disease, unspecified: Secondary | ICD-10-CM | POA: Insufficient documentation

## 2018-05-03 DIAGNOSIS — Z79899 Other long term (current) drug therapy: Secondary | ICD-10-CM | POA: Diagnosis not present

## 2018-05-03 DIAGNOSIS — E1122 Type 2 diabetes mellitus with diabetic chronic kidney disease: Secondary | ICD-10-CM | POA: Diagnosis not present

## 2018-05-03 DIAGNOSIS — M109 Gout, unspecified: Secondary | ICD-10-CM | POA: Diagnosis not present

## 2018-05-03 DIAGNOSIS — Z7901 Long term (current) use of anticoagulants: Secondary | ICD-10-CM | POA: Insufficient documentation

## 2018-05-03 DIAGNOSIS — I130 Hypertensive heart and chronic kidney disease with heart failure and stage 1 through stage 4 chronic kidney disease, or unspecified chronic kidney disease: Secondary | ICD-10-CM | POA: Insufficient documentation

## 2018-05-03 DIAGNOSIS — E785 Hyperlipidemia, unspecified: Secondary | ICD-10-CM | POA: Diagnosis not present

## 2018-05-03 DIAGNOSIS — I480 Paroxysmal atrial fibrillation: Secondary | ICD-10-CM

## 2018-05-03 LAB — CBC
HCT: 36.5 % (ref 36.0–46.0)
Hemoglobin: 10.4 g/dL — ABNORMAL LOW (ref 12.0–15.0)
MCH: 24.3 pg — ABNORMAL LOW (ref 26.0–34.0)
MCHC: 28.5 g/dL — ABNORMAL LOW (ref 30.0–36.0)
MCV: 85.3 fL (ref 80.0–100.0)
Platelets: 328 10*3/uL (ref 150–400)
RBC: 4.28 MIL/uL (ref 3.87–5.11)
RDW: 16 % — ABNORMAL HIGH (ref 11.5–15.5)
WBC: 12.4 10*3/uL — ABNORMAL HIGH (ref 4.0–10.5)
nRBC: 0 % (ref 0.0–0.2)

## 2018-05-03 LAB — BASIC METABOLIC PANEL
Anion gap: 10 (ref 5–15)
BUN: 65 mg/dL — ABNORMAL HIGH (ref 8–23)
CO2: 22 mmol/L (ref 22–32)
Calcium: 8.2 mg/dL — ABNORMAL LOW (ref 8.9–10.3)
Chloride: 107 mmol/L (ref 98–111)
Creatinine, Ser: 2.86 mg/dL — ABNORMAL HIGH (ref 0.44–1.00)
GFR calc Af Amer: 18 mL/min — ABNORMAL LOW (ref >60)
GFR calc non Af Amer: 15 mL/min — ABNORMAL LOW (ref >60)
Glucose, Bld: 127 mg/dL — ABNORMAL HIGH (ref 70–99)
Potassium: 4.1 mmol/L (ref 3.5–5.1)
Sodium: 139 mmol/L (ref 135–145)

## 2018-05-03 NOTE — Patient Instructions (Signed)
Labs were done today. We will call you with any ABNORMAL results. No news is good news!  Your physician recommends that you schedule a follow-up appointment in: 2 MONTHS

## 2018-05-04 NOTE — Progress Notes (Signed)
Advanced Heart Failure Clinic Note  PCP: Alroy Dust, L.Marlou Sa, MD HF Cardiology: Dr. Aundra Dubin  78 y.o.with history of CKD stage 3, paroxysmal atrial fibrillation, and chronic diastolic CHF was referred by Dr. Wynonia Lawman for evaluation of CHF.  RHC/LHC was done in 10/15.  This showed markedly elevated filling pressures and pulmonary venous hypertension.  There was no significant CAD.  Last echo in 2/18 showed EF 50-55%, mild LVH, grade 2 diastolic dysfunction. She is followed by Dr. Moshe Cipro for nephrology.   Admitted 10/2-10/6/19 with SOB. RHC showed elevated filling pressures and preserved CO. She was diuresed with IV lasix, then transitioned to torsemide 80 mg am, 40 mg pm. Repeat echo showed EF 45-50% with normal RV. PYP scan was negative for TTR amyloid. PFTs showed restrictive disease. CT chest showed no ILD but did show right hydronephrosis. Renal US showed single kidney with mild to moderate hydronephrosis on right (unchanged since 2011).  DC weight 208 lbs.   She presents today for followup of CHF.  She seems to be doing better overall.  Weight down 1 lb.  Walks with walker, able to get about 50 yards before she is short of breath.  No orthopnea/PND.  No chest pain.  Cardiomems was denied.   Labs (2/18): K 4, creatinine 1.79 Labs (10/19): K 4.5, creatinine 2.4 Labs (12/19): LDL 62 Labs (1/20): K 3.9, creatinine 2.72  PMH: 1. Atrial fibrillation: Paroxysmal.  2. HTN 3. 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  5. CKD stage 3 6. H/o IVCD 7. COPD: PFTs (10/19) actually showed severe restriction, concern for interstitial process.  CT chest (10/19) showed mild-moderate patchy air trapping but no evidence for interstitial lung disease.  8. Gout 9. Chronic primarily diastolic CHF: Echo (8/26) with EF 50-55%, mild LVH, grade 2 diastolic dysfunction.  - LHC/RHC (10/15): small distal LAD but no discrete stenosis.  Mean RA 15, PA 55/20 mean 34, mean PCWP 31 => pulmonary venous  hypertension.  - PYP scan (10/19): No evidence for TTR amyloidosis.  - Echo (10/19): EF 45-50%, mild LVH, normal RV size and systolic function.  - RHC (10/19): mean RA 13, PA 74/27 mean 50, mean PCWP 28, CI 3.12, PVR 3.6 WU. Pulmonary venous hypertension.  10. Sleep study 2016: Negative per patient.   Review of systems complete and found to be negative unless listed in HPI.    Social History   Socioeconomic History  . Marital status: Widowed    Spouse name: Not on file  . Number of children: 2  . Years of education: Not on file  . Highest education level: Not on file  Occupational History  . Occupation: retired  Scientific laboratory technician  . Financial resource strain: Not on file  . Food insecurity:    Worry: Not on file    Inability: Not on file  . Transportation needs:    Medical: Not on file    Non-medical: Not on file  Tobacco Use  . Smoking status: Former Smoker    Packs/day: 1.00    Years: 20.00    Pack years: 20.00    Types: Cigarettes    Last attempt to quit: 02/22/2009    Years since quitting: 9.2  . Smokeless tobacco: Never Used  Substance and Sexual Activity  . Alcohol use: No    Alcohol/week: 0.0 standard drinks    Comment: quit 2011  . Drug use: No  . Sexual activity: Never  Lifestyle  . Physical activity:    Days per week: Not on  file    Minutes per session: Not on file  . Stress: Not on file  Relationships  . Social connections:    Talks on phone: Not on file    Gets together: Not on file    Attends religious service: Not on file    Active member of club or organization: Not on file    Attends meetings of clubs or organizations: Not on file    Relationship status: Not on file  . Intimate partner violence:    Fear of current or ex partner: Not on file    Emotionally abused: Not on file    Physically abused: Not on file    Forced sexual activity: Not on file  Other Topics Concern  . Not on file  Social History Narrative  . Not on file   Family History   Problem Relation Age of Onset  . Lupus Daughter   . Cancer Mother 42  . CVA Father 26  . Cancer Brother 45   Review of systems complete and found to be negative unless listed in HPI.    Current Outpatient Medications  Medication Sig Dispense Refill  . albuterol (PROVENTIL HFA;VENTOLIN HFA) 108 (90 BASE) MCG/ACT inhaler Inhale 2 puffs into the lungs every 4 (four) hours as needed for shortness of breath.     Marland Kitchen atorvastatin (LIPITOR) 80 MG tablet Take 80 mg by mouth daily.    . calcitRIOL (ROCALTROL) 0.25 MCG capsule Take 0.25 mcg by mouth daily.    . fluticasone (CUTIVATE) 0.005 % ointment Apply 1 application topically 2 (two) times daily.  2  . guaiFENesin (MUCINEX) 600 MG 12 hr tablet Take 600 mg by mouth daily as needed for cough.    . hydrALAZINE (APRESOLINE) 100 MG tablet Take 1 tablet (100 mg total) by mouth 2 (two) times daily. 60 tablet 3  . insulin aspart (NOVOLOG) 100 UNIT/ML injection Inject 10 Units into the skin 2 (two) times daily before lunch and supper.     . insulin degludec (TRESIBA FLEXTOUCH) 100 UNIT/ML SOPN FlexTouch Pen Inject 40 Units into the skin every morning.     . isosorbide mononitrate (IMDUR) 30 MG 24 hr tablet TAKE 1 TABLET BY MOUTH ONCE DAILY 30 tablet 0  . latanoprost (XALATAN) 0.005 % ophthalmic solution Place 1 drop into both eyes at bedtime.    . metolazone (ZAROXOLYN) 2.5 MG tablet Take ONLY as directed by CHF clinic. 5 tablet 0  . metoprolol succinate (TOPROL-XL) 25 MG 24 hr tablet Take 25 mg by mouth 2 (two) times daily.    Gean Birchwood ER 50 MG 12 hr tablet Take 50 mg by mouth every 12 (twelve) hours.  0  . potassium chloride SA (K-DUR,KLOR-CON) 20 MEQ tablet Take 2 tablets (40 mEq total) by mouth daily. 60 tablet 5  . repaglinide (PRANDIN) 0.5 MG tablet Take 0.5 mg by mouth 3 (three) times daily before meals.     . Rivaroxaban (XARELTO) 15 MG TABS tablet Take 1 tablet (15 mg total) by mouth daily with supper. 60 tablet 0  . torsemide (DEMADEX) 20 MG  tablet Take 4 tablets (80 mg total) by mouth 2 (two) times daily. Take an additional 40mg  (2 tabs) as needed 240 tablet 3  . traMADol (ULTRAM) 50 MG tablet Take 50 mg by mouth every 6 (six) hours as needed for severe pain.     Marland Kitchen triamcinolone cream (KENALOG) 0.1 % Apply 1 application topically 2 (two) times daily.     No current  facility-administered medications for this encounter.    Today's Vitals   05/03/18 1023  BP: (!) 148/60  Pulse: 69  SpO2: 95%  Weight: 94.4 kg (208 lb 3.2 oz)   Body mass index is 38.08 kg/m. Wt Readings from Last 3 Encounters:  05/03/18 94.4 kg (208 lb 3.2 oz)  03/21/18 93.4 kg (206 lb)  03/14/18 94.9 kg (209 lb 3.2 oz)   General: NAD Neck: No JVD, no thyromegaly or thyroid nodule.  Lungs: Clear to auscultation bilaterally with normal respiratory effort. CV: Nondisplaced PMI.  Heart regular S1/S2, no S3/S4, 1/6 SEM RUSB.  No peripheral edema.  No carotid bruit.  Normal pedal pulses.  Abdomen: Soft, nontender, no hepatosplenomegaly, no distention.  Skin: Intact without lesions or rashes.  Neurologic: Alert and oriented x 3.  Psych: Normal affect. Extremities: No clubbing or cyanosis.  HEENT: Normal.   Assessment/Plan: 1. Chronic diastolic CHF: Echo in 6/81 with EF 50-55%, mild LVH, grade 2 diastolic dysfunction. 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 Cardiac output preserved. Echo (10/19) with EF 45-50%, septal-lateral dyssynchrony, RV looks ok.  PYP scan was not suggestive of transthyretin amyloidosis.  NYHA class III symptoms, improved.  She does not look volume overloaded on exam.  - Continue torsemide 80 mg bid, BMET today.  - No spironolactone with CKD.  - Cardiomems denied by insurance, we will appeal.  2. COPD: She no longer smokes. Restrictive PFTs.  High rest CT negative for ILD.  3. HTN: BP controlled.  4. CKD: Stage 3.  BMET today. 5. Atrial fibrillation: Paroxysmal.  She is in NSR today.  - Continue Xarelto 15 mg daily.  - Continue Toprol XL 25 mg BID 6. Pulmonary HTN: Moderate to severe pulmonary hypertension on 10/19 RHC. PVR 3.6 WU. This appears to be primarily pulmonary venous hypertension.  - No treatment with selective pulmonary vasodilators for now.  7. Right hydronephrosis: Renal US showed single kidney with mild to moderate hydronephrosis on right (unchanged since 2011).   Followup in 2 months.   Loralie Champagne, MD  05/04/2018

## 2018-05-08 ENCOUNTER — Other Ambulatory Visit (HOSPITAL_COMMUNITY): Payer: Self-pay | Admitting: Cardiology

## 2018-05-15 ENCOUNTER — Other Ambulatory Visit (HOSPITAL_COMMUNITY): Payer: Self-pay | Admitting: Student

## 2018-05-24 DIAGNOSIS — D631 Anemia in chronic kidney disease: Secondary | ICD-10-CM | POA: Diagnosis not present

## 2018-05-24 DIAGNOSIS — E669 Obesity, unspecified: Secondary | ICD-10-CM | POA: Diagnosis not present

## 2018-05-24 DIAGNOSIS — N179 Acute kidney failure, unspecified: Secondary | ICD-10-CM | POA: Diagnosis not present

## 2018-05-24 DIAGNOSIS — I129 Hypertensive chronic kidney disease with stage 1 through stage 4 chronic kidney disease, or unspecified chronic kidney disease: Secondary | ICD-10-CM | POA: Diagnosis not present

## 2018-05-24 DIAGNOSIS — N183 Chronic kidney disease, stage 3 (moderate): Secondary | ICD-10-CM | POA: Diagnosis not present

## 2018-05-24 DIAGNOSIS — E1122 Type 2 diabetes mellitus with diabetic chronic kidney disease: Secondary | ICD-10-CM | POA: Diagnosis not present

## 2018-05-24 DIAGNOSIS — E875 Hyperkalemia: Secondary | ICD-10-CM | POA: Diagnosis not present

## 2018-05-24 DIAGNOSIS — R0600 Dyspnea, unspecified: Secondary | ICD-10-CM | POA: Diagnosis not present

## 2018-06-09 ENCOUNTER — Other Ambulatory Visit: Payer: Self-pay | Admitting: Student

## 2018-06-09 NOTE — Telephone Encounter (Signed)
This is a CHF pt 

## 2018-06-27 ENCOUNTER — Other Ambulatory Visit (HOSPITAL_COMMUNITY): Payer: Self-pay

## 2018-06-27 DIAGNOSIS — D631 Anemia in chronic kidney disease: Secondary | ICD-10-CM | POA: Diagnosis not present

## 2018-06-27 DIAGNOSIS — E875 Hyperkalemia: Secondary | ICD-10-CM | POA: Diagnosis not present

## 2018-06-27 DIAGNOSIS — R0600 Dyspnea, unspecified: Secondary | ICD-10-CM | POA: Diagnosis not present

## 2018-06-27 DIAGNOSIS — N183 Chronic kidney disease, stage 3 (moderate): Secondary | ICD-10-CM | POA: Diagnosis not present

## 2018-06-27 DIAGNOSIS — I129 Hypertensive chronic kidney disease with stage 1 through stage 4 chronic kidney disease, or unspecified chronic kidney disease: Secondary | ICD-10-CM | POA: Diagnosis not present

## 2018-06-27 DIAGNOSIS — N179 Acute kidney failure, unspecified: Secondary | ICD-10-CM | POA: Diagnosis not present

## 2018-06-27 DIAGNOSIS — E669 Obesity, unspecified: Secondary | ICD-10-CM | POA: Diagnosis not present

## 2018-06-27 DIAGNOSIS — E1122 Type 2 diabetes mellitus with diabetic chronic kidney disease: Secondary | ICD-10-CM | POA: Diagnosis not present

## 2018-06-27 MED ORDER — RIVAROXABAN 15 MG PO TABS: 15.0000 mg | ORAL_TABLET | Freq: Every day | ORAL | 3 refills | Status: DC

## 2018-06-27 NOTE — Telephone Encounter (Signed)
Received fax for refill for Xarelto 15mg  daily, escribed

## 2018-07-05 ENCOUNTER — Other Ambulatory Visit (HOSPITAL_COMMUNITY): Payer: Self-pay | Admitting: *Deleted

## 2018-07-05 ENCOUNTER — Other Ambulatory Visit: Payer: Self-pay

## 2018-07-06 ENCOUNTER — Other Ambulatory Visit: Payer: Self-pay | Admitting: Cardiology

## 2018-07-06 ENCOUNTER — Ambulatory Visit (HOSPITAL_COMMUNITY)
Admission: RE | Admit: 2018-07-06 | Discharge: 2018-07-06 | Disposition: A | Discharge status: Home / Self Care | Payer: PPO | Source: Ambulatory Visit | Attending: Nephrology | Admitting: Nephrology

## 2018-07-06 ENCOUNTER — Other Ambulatory Visit: Payer: Self-pay

## 2018-07-06 DIAGNOSIS — D631 Anemia in chronic kidney disease: Secondary | ICD-10-CM | POA: Diagnosis not present

## 2018-07-06 DIAGNOSIS — N189 Chronic kidney disease, unspecified: Secondary | ICD-10-CM | POA: Insufficient documentation

## 2018-07-06 MED ORDER — SODIUM CHLORIDE 0.9 % IV SOLN
510.0000 mg | INTRAVENOUS | Status: DC
  Administered 2018-07-06: 510 mg via INTRAVENOUS
  Filled 2018-07-06: qty 17

## 2018-07-06 NOTE — Discharge Instructions (Signed)

## 2018-07-07 ENCOUNTER — Encounter (HOSPITAL_COMMUNITY): Payer: PPO | Admitting: Cardiology

## 2018-07-11 ENCOUNTER — Other Ambulatory Visit: Payer: Self-pay

## 2018-07-11 ENCOUNTER — Encounter (HOSPITAL_COMMUNITY): Payer: Self-pay

## 2018-07-11 ENCOUNTER — Ambulatory Visit (HOSPITAL_COMMUNITY)
Admission: RE | Admit: 2018-07-11 | Discharge: 2018-07-11 | Disposition: A | Discharge status: Home / Self Care | Payer: PPO | Source: Ambulatory Visit | Attending: Cardiology | Admitting: Cardiology

## 2018-07-11 VITALS — Wt 203.0 lb

## 2018-07-11 DIAGNOSIS — I5022 Chronic systolic (congestive) heart failure: Secondary | ICD-10-CM | POA: Diagnosis not present

## 2018-07-11 DIAGNOSIS — I480 Paroxysmal atrial fibrillation: Secondary | ICD-10-CM

## 2018-07-11 DIAGNOSIS — I10 Essential (primary) hypertension: Secondary | ICD-10-CM

## 2018-07-11 DIAGNOSIS — N183 Chronic kidney disease, stage 3 unspecified: Secondary | ICD-10-CM

## 2018-07-11 NOTE — Progress Notes (Signed)
Spoke with patient, discussed AVS.  All questions answered. appt made for next week for nurse/lab visit.   Pt verbalized understanding of all information.  AVS mailed.

## 2018-07-11 NOTE — Addendum Note (Signed)
Encounter addended by: Valeda Malm, RN on: 07/11/2018 1:02 PM  Actions taken: Clinical Note Signed, Diagnosis association updated, Order list changed

## 2018-07-11 NOTE — Patient Instructions (Addendum)
Blood pressure check and labs in HF clinic: 07/18/2018 at 10am  Increase torsemide to 100 mg twice a day x 2 days, then back to 80 mg twice a day.   Follow up in 2-3 months with Dr Aundra Dubin.

## 2018-07-11 NOTE — Progress Notes (Signed)
Heart Failure TeleHealth Note  Due to national recommendations of social distancing due to [place] 19, telehealth visit is felt to be most appropriate for this patient at this time.  I discussed the limitations, risks, security and privacy concerns of performing an evaluation and management service by telephone and the availability of in person appointments. I also discussed with the patient that there may be a patient responsible charge related to this service. The patient expressed understanding and agreed to proceed.   ID:  Stephanie Woodward, DOB 1940/04/27, MRN [id number]  Location: Home  Provider location: [address], [place] Type of Visit: Established patient   PCP:  Alroy Dust, L.Marlou Sa, MD  Cardiologist:  Ezzard Standing, MD Primary HF: Dr Aundra Dubin  Chief Complaint: HF follow up   History of Present Illness: Stephanie Woodward is a 78 y.o. female with a history of CKD stage 3, paroxysmal atrial fibrillation, and chronic diastolic CHF was referred by Dr. Wynonia Lawman for evaluation of CHF.  RHC/LHC was done in 10/15.  This showed markedly elevated filling pressures and pulmonary venous hypertension.  There was no significant CAD.  Last echo in 2/18 showed EF 50-55%, mild LVH, grade 2 diastolic dysfunction. She is followed by Dr. Moshe Cipro for nephrology.   Admitted 10/2-10/6/19 with SOB. RHC showed elevated filling pressures and preserved CO. She was diuresed with IV lasix, then transitioned to torsemide 80 mg am, 40 mg pm. Repeat echo showed EF 45-50% with normal RV. PYP scan was negative for TTR amyloid. PFTs showed restrictive disease. CT chest showed no ILD but did show right hydronephrosis. Renal US showed single kidney with mild to moderate hydronephrosis on right (unchanged since 2011).  DC weight 208 lbs.   Patient presents via Engineer, civil (consulting) for a telehealth visit today. She had a visit with Dr Aundra Dubin 05/03/18 and was doing well. Overall doing fine. Over the last 3  weeks, she has been more SOB on exertion, can only walk about 100 feet before she feels SOB. +bloating. No peripheral edema. She currently has a cough with clear/white sputum. No fever/chills. Chronic orthopnea. Occasional PND. No CP, dizziness, or palpitations. Weight has been stable, 203 lbs. Good UOP with torsemide, has taken additional 40 mg a month ago, but none recent. Taking all medications. Limits fluid and salt intake. BP cuff is broken. No food insecurity.  Pt denies symptoms of cough, fevers, chills, or new SOB worrisome for COVID 19.    Past Medical History:  Diagnosis Date  . Arthritis   . Asthma   . CHF (congestive heart failure) ([place])   . CKD (chronic kidney disease), stage III ([place])   . COPD (chronic obstructive pulmonary disease) ([place])   . Diabetes mellitus    INSULIN DEPENDENT  . Gout   . Hyperlipemia   . Hypertension   . Psoriasis   . Renal disorder    congenital  . Single kidney    Past Surgical History:  Procedure Laterality Date  . ABDOMINAL HYSTERECTOMY    . CHOLECYSTECTOMY    . LEFT AND RIGHT HEART CATHETERIZATION WITH CORONARY ANGIOGRAM N/A 11/29/2013   Procedure: LEFT AND RIGHT HEART CATHETERIZATION WITH CORONARY ANGIOGRAM;  Surgeon: Jacolyn Reedy, MD;  Location: [facility] CATH LAB;  Service: Cardiovascular;  Laterality: N/A;  . RIGHT HEART CATH N/A 11/23/2017   Procedure: RIGHT HEART CATH;  Surgeon: Larey Dresser, MD;  Location: [facility] CV LAB;  Service: Cardiovascular;  Laterality: N/A;     Current Outpatient Medications  Medication Sig Dispense Refill  . albuterol (PROVENTIL HFA;VENTOLIN HFA) 108 (90 BASE) MCG/ACT inhaler Inhale 2 puffs into the lungs every 4 (four) hours as needed for shortness of breath.     Marland Kitchen atorvastatin (LIPITOR) 80 MG tablet Take 80 mg by mouth daily.    . calcitRIOL (ROCALTROL) 0.25 MCG capsule Take 0.25 mcg by mouth daily.    . fluticasone (CUTIVATE) 0.005 % ointment Apply 1 application topically 2 (two) times daily.  2  .  guaiFENesin (MUCINEX) 600 MG 12 hr tablet Take 600 mg by mouth daily as needed for cough.    . hydrALAZINE (APRESOLINE) 100 MG tablet Take 1 tablet by mouth twice daily 180 tablet 0  . insulin aspart (NOVOLOG) 100 UNIT/ML injection Inject 10 Units into the skin 2 (two) times daily before lunch and supper.     . insulin degludec (TRESIBA FLEXTOUCH) 100 UNIT/ML SOPN FlexTouch Pen Inject 40 Units into the skin every morning.     . isosorbide mononitrate (IMDUR) 30 MG 24 hr tablet Take 1 tablet by mouth once daily 30 tablet 2  . latanoprost (XALATAN) 0.005 % ophthalmic solution Place 1 drop into both eyes at bedtime.    . metolazone (ZAROXOLYN) 2.5 MG tablet Take ONLY as directed by CHF clinic. 5 tablet 0  . metoprolol succinate (TOPROL-XL) 25 MG 24 hr tablet Take 25 mg by mouth 2 (two) times daily.    Gean [place]ch[place] ER 50 MG 12 hr tablet Take 50 mg by mouth every 12 (twelve) hours.  0  . potassium chloride SA (K-DUR) 20 MEQ tablet Take 2 tablets by mouth once daily 60 tablet 0  . repaglinide (PRANDIN) 0.5 MG tablet Take 0.5 mg by mouth 3 (three) times daily before meals.     . Rivaroxaban (XARELTO) 15 MG TABS tablet Take 1 tablet (15 mg total) by mouth daily with supper. 90 tablet 3  . torsemide (DEMADEX) 20 MG tablet Take 4 tablets (80 mg total) by mouth 2 (two) times daily. Take an additional 40mg  (2 tabs) as needed 240 tablet 3  . traMADol (ULTRAM) 50 MG tablet Take 50 mg by mouth every 6 (six) hours as needed for severe pain.     Marland Kitchen triamcinolone cream (KENALOG) 0.1 % Apply 1 application topically 2 (two) times daily.     No current facility-administered medications for this encounter.     Allergies:   Eggs or egg-derived products; Lisinopril; and Penicillins   Social History:  The patient  reports that she quit smoking about 9 years ago. Her smoking use included cigarettes. She has a 20.00 pack-year smoking history. She has never used smokeless tobacco. She reports that she does not drink alcohol or  use drugs.   Family History:  The patient's family history includes CVA (age of onset: 51) in her father; Cancer (age of onset: 52) in her brother; Cancer (age of onset: 90) in her mother; Lupus in her daughter.   ROS:  Please see the history of present illness.   All other systems are personally reviewed and negative.    Exam:  (Video/Tele Health Call; Exam is subjective and or/visual.) General:  Speaks in full sentences. No resp difficulty. Wearing mask.  Lungs: Normal respiratory effort with conversation.  Abdomen: + distension per patient report Extremities: Pt denies edema. Neuro: Alert & oriented x 3.   Recent Labs: 11/23/2017: B Natriuretic Peptide 271.8 11/24/2017: TSH 1.263 05/03/2018: BUN 65; Creatinine, Ser 2.86; Hemoglobin 10.4; Platelets 328; Potassium 4.1; Sodium 139  Personally reviewed   Wt Readings from Last 3 Encounters:  07/06/18 91.2 kg (201 lb)  05/03/18 94.4 kg (208 lb 3.2 oz)  03/21/18 93.4 kg (206 lb)      ASSESSMENT AND PLAN:  1. Chronic diastolic CHF: Echo in 8/52 with EF 50-55%, mild LVH, grade 2 diastolic dysfunction. LHC in 2015 showed nonobstructive CAD and elevated filling pressures with pulmonary venous hypertension.[place] 11/23/17 showed significantly elevated right and left heart filling pressures and severe primarily pulmonary venous hypertension. Cardiac output preserved. Echo (10/19) with EF 45-50%, septal-lateral dyssynchrony, RV looks ok.  PYP scan was not suggestive of transthyretin amyloidosis.  NYHA class III symptoms, improved.  Volume sounds elevated based on symptoms. Weight is stable. - Increase torsemide to 100 mg BID x 2 days, then back to torsemide 80 mg bid. Check BMET and BNP in 1-2 weeks. - No spironolactone with CKD.  - Cardiomems denied by insurance. We have appealed. 2. COPD: Restrictive PFTs. High rest CT negative for ILD. Remains off cigarettes. 3. HTN: Unable to assess. Will check BP when she comes in for labs.  4. CKD: Stage 4.   Creatinine 2.86, K 4.1 on 05/03/18. Recheck in 1-2 weeks. 5. Atrial fibrillation: Paroxysmal.  Denies palpitations.  - Continue Xarelto 15 mg daily.  - Continue Toprol XL 25 mg BID 6. Pulmonary HTN: Moderate to severe pulmonary hypertension on 10/19 RHC. PVR 3.6 WU. This appears to be primarily pulmonary venous hypertension.  - No treatment with selective pulmonary vasodilators for now. No change.  7. Right hydronephrosis: Renal US showed single kidney with mild to moderate hydronephrosis on right (unchanged since 2011). No change.    COVID screen The patient does not have any symptoms that suggest any further testing/ screening at this time.  Social distancing reinforced today.  Patient Risk: After full review of this patients clinical status, I feel that they are at moderate risk for cardiac decompensation at this time.  Orders/Follow up: Follow up in 2-3 months with Dr Aundra Dubin. BMET and BNP with BP check in 1-2 weeks.   Today, I have spent 15 minutes with the patient with telehealth technology discussing CHF.    Signed, Georgiana Shore, NP  07/11/2018 12:30 PM   [facility] [address] [facility] and [place] [address] [phone] (office) [phone] (fax)

## 2018-07-12 ENCOUNTER — Other Ambulatory Visit: Payer: Self-pay

## 2018-07-13 ENCOUNTER — Ambulatory Visit (HOSPITAL_COMMUNITY)
Admission: RE | Admit: 2018-07-13 | Discharge: 2018-07-13 | Disposition: A | Discharge status: Home / Self Care | Payer: PPO | Source: Ambulatory Visit | Attending: Nephrology | Admitting: Nephrology

## 2018-07-13 DIAGNOSIS — D631 Anemia in chronic kidney disease: Secondary | ICD-10-CM | POA: Insufficient documentation

## 2018-07-13 MED ORDER — SODIUM CHLORIDE 0.9 % IV SOLN
510.0000 mg | INTRAVENOUS | Status: DC
  Administered 2018-07-13: 510 mg via INTRAVENOUS
  Filled 2018-07-13: qty 510

## 2018-07-18 ENCOUNTER — Other Ambulatory Visit: Payer: Self-pay

## 2018-07-18 ENCOUNTER — Ambulatory Visit (HOSPITAL_COMMUNITY)
Admission: RE | Admit: 2018-07-18 | Discharge: 2018-07-18 | Disposition: A | Discharge status: Home / Self Care | Payer: PPO | Source: Ambulatory Visit | Attending: Cardiology | Admitting: Cardiology

## 2018-07-18 VITALS — BP 140/76 | HR 86 | Wt 203.2 lb

## 2018-07-18 DIAGNOSIS — I5022 Chronic systolic (congestive) heart failure: Secondary | ICD-10-CM | POA: Insufficient documentation

## 2018-07-18 LAB — BASIC METABOLIC PANEL
Anion gap: 12 (ref 5–15)
BUN: 64 mg/dL — ABNORMAL HIGH (ref 8–23)
CO2: 24 mmol/L (ref 22–32)
Calcium: 9.1 mg/dL (ref 8.9–10.3)
Chloride: 104 mmol/L (ref 98–111)
Creatinine, Ser: 3.06 mg/dL — ABNORMAL HIGH (ref 0.44–1.00)
GFR calc Af Amer: 17 mL/min — ABNORMAL LOW (ref >60)
GFR calc non Af Amer: 14 mL/min — ABNORMAL LOW (ref >60)
Glucose, Bld: 183 mg/dL — ABNORMAL HIGH (ref 70–99)
Potassium: 4.5 mmol/L (ref 3.5–5.1)
Sodium: 140 mmol/L (ref 135–145)

## 2018-07-18 LAB — BRAIN NATRIURETIC PEPTIDE: B Natriuretic Peptide: 168.5 pg/mL — ABNORMAL HIGH (ref 0.0–100.0)

## 2018-07-18 NOTE — Addendum Note (Signed)
Encounter addended by: Scarlette Calico, RN on: 07/18/2018 12:54 PM  Actions taken: Child order released for a procedure order

## 2018-07-18 NOTE — Addendum Note (Signed)
Encounter addended by: Harvie Junior, CMA on: 07/18/2018 11:13 AM  Actions taken: Charge Capture section accepted

## 2018-07-18 NOTE — Progress Notes (Signed)
Patient seen in office for a nurse visit (bp check and lab work). No complaints at this time. Blood pressure at 9:54am 140/76 patient stated she took her medications around 8:30am.

## 2018-07-19 ENCOUNTER — Encounter (HOSPITAL_COMMUNITY): Payer: Self-pay

## 2018-07-19 ENCOUNTER — Telehealth (HOSPITAL_COMMUNITY): Payer: Self-pay

## 2018-07-19 DIAGNOSIS — I5032 Chronic diastolic (congestive) heart failure: Secondary | ICD-10-CM

## 2018-07-19 MED ORDER — TORSEMIDE 20 MG PO TABS: 60.0000 mg | ORAL_TABLET | Freq: Two times a day (BID) | ORAL | 3 refills | Status: DC

## 2018-07-19 NOTE — Telephone Encounter (Signed)
-----   Message from Conrad Algood, NP sent at 07/19/2018  2:25 PM EDT ----- Please call. Renal function elevated. Hold torsemide for  1 day then cut back torsemide to 60 mg twice a day. Stop Potassium. Repeat BMET in 7 days.

## 2018-07-19 NOTE — Telephone Encounter (Signed)
Spoke with patient, she is aware of lab results and recommendations to STOP potassium, hold torsemide for 1 day and resume at 3 tabs BID.  She verbalized understanding and asked if information to be sent on mychart. Instructions sent on mychart. Pt appreciative. Will RTC in 1 week. appt made.

## 2018-07-26 ENCOUNTER — Ambulatory Visit (HOSPITAL_COMMUNITY)
Admission: RE | Admit: 2018-07-26 | Discharge: 2018-07-26 | Disposition: A | Discharge status: Home / Self Care | Payer: PPO | Source: Ambulatory Visit | Attending: Internal Medicine | Admitting: Internal Medicine

## 2018-07-26 ENCOUNTER — Other Ambulatory Visit: Payer: Self-pay

## 2018-07-26 DIAGNOSIS — I5032 Chronic diastolic (congestive) heart failure: Secondary | ICD-10-CM

## 2018-07-26 LAB — BASIC METABOLIC PANEL
Anion gap: 13 (ref 5–15)
BUN: 53 mg/dL — ABNORMAL HIGH (ref 8–23)
CO2: 24 mmol/L (ref 22–32)
Calcium: 8.9 mg/dL (ref 8.9–10.3)
Chloride: 106 mmol/L (ref 98–111)
Creatinine, Ser: 2.8 mg/dL — ABNORMAL HIGH (ref 0.44–1.00)
GFR calc Af Amer: 18 mL/min — ABNORMAL LOW (ref >60)
GFR calc non Af Amer: 16 mL/min — ABNORMAL LOW (ref >60)
Glucose, Bld: 112 mg/dL — ABNORMAL HIGH (ref 70–99)
Potassium: 4.4 mmol/L (ref 3.5–5.1)
Sodium: 143 mmol/L (ref 135–145)

## 2018-08-07 ENCOUNTER — Other Ambulatory Visit (HOSPITAL_COMMUNITY): Payer: Self-pay | Admitting: Cardiology

## 2018-08-11 DIAGNOSIS — I129 Hypertensive chronic kidney disease with stage 1 through stage 4 chronic kidney disease, or unspecified chronic kidney disease: Secondary | ICD-10-CM | POA: Diagnosis not present

## 2018-08-11 DIAGNOSIS — N183 Chronic kidney disease, stage 3 (moderate): Secondary | ICD-10-CM | POA: Diagnosis not present

## 2018-08-11 DIAGNOSIS — D631 Anemia in chronic kidney disease: Secondary | ICD-10-CM | POA: Diagnosis not present

## 2018-08-11 DIAGNOSIS — E875 Hyperkalemia: Secondary | ICD-10-CM | POA: Diagnosis not present

## 2018-08-11 DIAGNOSIS — N184 Chronic kidney disease, stage 4 (severe): Secondary | ICD-10-CM | POA: Diagnosis not present

## 2018-08-11 DIAGNOSIS — E1122 Type 2 diabetes mellitus with diabetic chronic kidney disease: Secondary | ICD-10-CM | POA: Diagnosis not present

## 2018-08-11 DIAGNOSIS — R0600 Dyspnea, unspecified: Secondary | ICD-10-CM | POA: Diagnosis not present

## 2018-08-11 DIAGNOSIS — R110 Nausea: Secondary | ICD-10-CM | POA: Diagnosis not present

## 2018-08-11 DIAGNOSIS — N2581 Secondary hyperparathyroidism of renal origin: Secondary | ICD-10-CM | POA: Diagnosis not present

## 2018-08-11 DIAGNOSIS — N179 Acute kidney failure, unspecified: Secondary | ICD-10-CM | POA: Diagnosis not present

## 2018-08-18 ENCOUNTER — Telehealth: Payer: Self-pay | Admitting: Cardiology

## 2018-08-18 ENCOUNTER — Other Ambulatory Visit (HOSPITAL_COMMUNITY): Payer: Self-pay | Admitting: Student

## 2018-08-18 DIAGNOSIS — Z961 Presence of intraocular lens: Secondary | ICD-10-CM | POA: Diagnosis not present

## 2018-08-18 DIAGNOSIS — H401131 Primary open-angle glaucoma, bilateral, mild stage: Secondary | ICD-10-CM | POA: Diagnosis not present

## 2018-08-18 DIAGNOSIS — H10503 Unspecified blepharoconjunctivitis, bilateral: Secondary | ICD-10-CM | POA: Diagnosis not present

## 2018-08-18 NOTE — Telephone Encounter (Signed)
°*  STAT* If patient is at the pharmacy, call can be transferred to refill team.   1. Which medications need to be refilled? (please list name of each medication and dose if known) Metoprolol ER 25mg  tablet one tablet twice daily  2. Which pharmacy/location (including street and city if local pharmacy) is medication to be sent to? [id number]  3. Do they need a 30 day or 90 day supply? [place]

## 2018-09-11 DIAGNOSIS — M5136 Other intervertebral disc degeneration, lumbar region: Secondary | ICD-10-CM | POA: Diagnosis not present

## 2018-09-11 DIAGNOSIS — M545 Low back pain: Secondary | ICD-10-CM | POA: Diagnosis not present

## 2018-09-11 DIAGNOSIS — M259 Joint disorder, unspecified: Secondary | ICD-10-CM | POA: Diagnosis not present

## 2018-09-11 DIAGNOSIS — Z5181 Encounter for therapeutic drug level monitoring: Secondary | ICD-10-CM | POA: Diagnosis not present

## 2018-09-12 ENCOUNTER — Other Ambulatory Visit (HOSPITAL_COMMUNITY): Payer: Self-pay | Admitting: Cardiology

## 2018-09-18 ENCOUNTER — Ambulatory Visit (HOSPITAL_COMMUNITY)
Admission: RE | Admit: 2018-09-18 | Discharge: 2018-09-18 | Disposition: A | Discharge status: Home / Self Care | Payer: PPO | Source: Ambulatory Visit | Attending: Cardiology | Admitting: Cardiology

## 2018-09-18 ENCOUNTER — Encounter (HOSPITAL_COMMUNITY): Payer: Self-pay | Admitting: Cardiology

## 2018-09-18 ENCOUNTER — Other Ambulatory Visit: Payer: Self-pay

## 2018-09-18 VITALS — BP 152/80 | HR 77 | Wt 206.0 lb

## 2018-09-18 DIAGNOSIS — N183 Chronic kidney disease, stage 3 unspecified: Secondary | ICD-10-CM

## 2018-09-18 DIAGNOSIS — M109 Gout, unspecified: Secondary | ICD-10-CM | POA: Diagnosis not present

## 2018-09-18 DIAGNOSIS — I480 Paroxysmal atrial fibrillation: Secondary | ICD-10-CM | POA: Diagnosis not present

## 2018-09-18 DIAGNOSIS — E1122 Type 2 diabetes mellitus with diabetic chronic kidney disease: Secondary | ICD-10-CM | POA: Insufficient documentation

## 2018-09-18 DIAGNOSIS — I130 Hypertensive heart and chronic kidney disease with heart failure and stage 1 through stage 4 chronic kidney disease, or unspecified chronic kidney disease: Secondary | ICD-10-CM | POA: Insufficient documentation

## 2018-09-18 DIAGNOSIS — I5022 Chronic systolic (congestive) heart failure: Secondary | ICD-10-CM | POA: Diagnosis not present

## 2018-09-18 DIAGNOSIS — J449 Chronic obstructive pulmonary disease, unspecified: Secondary | ICD-10-CM | POA: Diagnosis not present

## 2018-09-18 DIAGNOSIS — I5032 Chronic diastolic (congestive) heart failure: Secondary | ICD-10-CM | POA: Diagnosis not present

## 2018-09-18 DIAGNOSIS — I272 Pulmonary hypertension, unspecified: Secondary | ICD-10-CM | POA: Insufficient documentation

## 2018-09-18 DIAGNOSIS — N1330 Unspecified hydronephrosis: Secondary | ICD-10-CM | POA: Diagnosis not present

## 2018-09-18 DIAGNOSIS — Z79899 Other long term (current) drug therapy: Secondary | ICD-10-CM | POA: Diagnosis not present

## 2018-09-18 DIAGNOSIS — E785 Hyperlipidemia, unspecified: Secondary | ICD-10-CM | POA: Insufficient documentation

## 2018-09-18 DIAGNOSIS — Z823 Family history of stroke: Secondary | ICD-10-CM | POA: Diagnosis not present

## 2018-09-18 DIAGNOSIS — Z7901 Long term (current) use of anticoagulants: Secondary | ICD-10-CM | POA: Insufficient documentation

## 2018-09-18 DIAGNOSIS — Z87891 Personal history of nicotine dependence: Secondary | ICD-10-CM | POA: Diagnosis not present

## 2018-09-18 DIAGNOSIS — Z794 Long term (current) use of insulin: Secondary | ICD-10-CM | POA: Diagnosis not present

## 2018-09-18 DIAGNOSIS — I2510 Atherosclerotic heart disease of native coronary artery without angina pectoris: Secondary | ICD-10-CM | POA: Diagnosis not present

## 2018-09-18 LAB — CBC
HCT: 40.4 % (ref 36.0–46.0)
Hemoglobin: 12.3 g/dL (ref 12.0–15.0)
MCH: 27.3 pg (ref 26.0–34.0)
MCHC: 30.4 g/dL (ref 30.0–36.0)
MCV: 89.6 fL (ref 80.0–100.0)
Platelets: 328 10*3/uL (ref 150–400)
RBC: 4.51 MIL/uL (ref 3.87–5.11)
RDW: 17.5 % — ABNORMAL HIGH (ref 11.5–15.5)
WBC: 18.9 10*3/uL — ABNORMAL HIGH (ref 4.0–10.5)
nRBC: 0 % (ref 0.0–0.2)

## 2018-09-18 LAB — BASIC METABOLIC PANEL
Anion gap: 11 (ref 5–15)
BUN: 47 mg/dL — ABNORMAL HIGH (ref 8–23)
CO2: 26 mmol/L (ref 22–32)
Calcium: 8.8 mg/dL — ABNORMAL LOW (ref 8.9–10.3)
Chloride: 105 mmol/L (ref 98–111)
Creatinine, Ser: 2.53 mg/dL — ABNORMAL HIGH (ref 0.44–1.00)
GFR calc Af Amer: 21 mL/min — ABNORMAL LOW (ref >60)
GFR calc non Af Amer: 18 mL/min — ABNORMAL LOW (ref >60)
Glucose, Bld: 103 mg/dL — ABNORMAL HIGH (ref 70–99)
Potassium: 3.9 mmol/L (ref 3.5–5.1)
Sodium: 142 mmol/L (ref 135–145)

## 2018-09-18 MED ORDER — TORSEMIDE 20 MG PO TABS: 80.0000 mg | ORAL_TABLET | Freq: Two times a day (BID) | ORAL | 3 refills | Status: DC

## 2018-09-18 MED ORDER — METOPROLOL SUCCINATE ER 25 MG PO TB24: ORAL_TABLET | ORAL | 6 refills | Status: DC

## 2018-09-18 NOTE — Progress Notes (Signed)
Advanced Heart Failure Clinic Note  PCP: Alroy Dust, L.Marlou Sa, MD HF Cardiology: Dr. Aundra Dubin  78 y.o.with history of CKD stage 3, paroxysmal atrial fibrillation, and chronic diastolic CHF was referred by Dr. Wynonia Lawman for evaluation of CHF.  RHC/LHC was done in 10/15.  This showed markedly elevated filling pressures and pulmonary venous hypertension.  There was no significant CAD.  Last echo in 2/18 showed EF 50-55%, mild LVH, grade 2 diastolic dysfunction. She is followed by Dr. Moshe Cipro for nephrology.   Admitted 10/2-10/6/19 with SOB. RHC showed elevated filling pressures and preserved CO. She was diuresed with IV lasix, then transitioned to torsemide 80 mg am, 40 mg pm. Repeat echo showed EF 45-50% with normal RV. PYP scan was negative for TTR amyloid. PFTs showed restrictive disease. CT chest showed no ILD but did show right hydronephrosis. Renal US showed single kidney with mild to moderate hydronephrosis on right (unchanged since 2011).  DC weight 208 lbs.   She presents today for followup of CHF.  BP is elevated but she has not taken her morning meds yet.  For about a week, she has had increased cough and dyspnea.  No fever.  She has had orthopnea.  Uses a walker at baseline because of back pain.  Now short of breath walking 20-30 feet.  Weight is up about 3 lbs at home.  Seemed to start picking up fluid excess after decreasing torsemide to 60 mg bid.  Has been taking torsemide 80 mg bid again for the last couple of days.   Labs (2/18): K 4, creatinine 1.79 Labs (10/19): K 4.5, creatinine 2.4 Labs (12/19): LDL 62 Labs (1/20): K 3.9, creatinine 2.72  PMH: 1. Atrial fibrillation: Paroxysmal.  2. HTN 3. 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  5. CKD stage 3 6. H/o IVCD 7. COPD: PFTs (10/19) actually showed severe restriction, concern for interstitial process.  CT chest (10/19) showed mild-moderate patchy air trapping but no evidence for interstitial lung disease.  8.  Gout 9. Chronic primarily diastolic CHF: Echo (4/27) with EF 50-55%, mild LVH, grade 2 diastolic dysfunction.  - LHC/RHC (10/15): small distal LAD but no discrete stenosis.  Mean RA 15, PA 55/20 mean 34, mean PCWP 31 => pulmonary venous hypertension.  - PYP scan (10/19): No evidence for TTR amyloidosis.  - Echo (10/19): EF 45-50%, mild LVH, normal RV size and systolic function.  - RHC (10/19): mean RA 13, PA 74/27 mean 50, mean PCWP 28, CI 3.12, PVR 3.6 WU. Pulmonary venous hypertension.  10. Sleep study 2016: Negative per patient.   Review of systems complete and found to be negative unless listed in HPI.    Social History   Socioeconomic History  . Marital status: Widowed    Spouse name: Not on file  . Number of children: 2  . Years of education: Not on file  . Highest education level: Not on file  Occupational History  . Occupation: retired  Scientific laboratory technician  . Financial resource strain: Not on file  . Food insecurity    Worry: Not on file    Inability: Not on file  . Transportation needs    Medical: Not on file    Non-medical: Not on file  Tobacco Use  . Smoking status: Former Smoker    Packs/day: 1.00    Years: 20.00    Pack years: 20.00    Types: Cigarettes    Quit date: 02/22/2009    Years since quitting: 9.5  . Smokeless tobacco: Never  Used  Substance and Sexual Activity  . Alcohol use: No    Alcohol/week: 0.0 standard drinks    Comment: quit 2011  . Drug use: No  . Sexual activity: Never  Lifestyle  . Physical activity    Days per week: Not on file    Minutes per session: Not on file  . Stress: Not on file  Relationships  . Social Herbalist on phone: Not on file    Gets together: Not on file    Attends religious service: Not on file    Active member of club or organization: Not on file    Attends meetings of clubs or organizations: Not on file    Relationship status: Not on file  . Intimate partner violence    Fear of current or ex partner: Not  on file    Emotionally abused: Not on file    Physically abused: Not on file    Forced sexual activity: Not on file  Other Topics Concern  . Not on file  Social History Narrative  . Not on file   Family History  Problem Relation Age of Onset  . Lupus Daughter   . Cancer Mother 89  . CVA Father 58  . Cancer Brother 45   Review of systems complete and found to be negative unless listed in HPI.    Current Outpatient Medications  Medication Sig Dispense Refill  . albuterol (PROVENTIL HFA;VENTOLIN HFA) 108 (90 BASE) MCG/ACT inhaler Inhale 2 puffs into the lungs every 4 (four) hours as needed for shortness of breath.     Marland Kitchen atorvastatin (LIPITOR) 80 MG tablet Take 80 mg by mouth daily.    . calcitRIOL (ROCALTROL) 0.25 MCG capsule Take 0.25 mcg by mouth daily.    . fluticasone (CUTIVATE) 0.005 % ointment Apply 1 application topically 2 (two) times daily.  2  . guaiFENesin (MUCINEX) 600 MG 12 hr tablet Take 600 mg by mouth daily as needed for cough.    . hydrALAZINE (APRESOLINE) 100 MG tablet Take 1 tablet by mouth twice daily 180 tablet 0  . insulin aspart (NOVOLOG) 100 UNIT/ML injection Inject 10 Units into the skin 2 (two) times daily before lunch and supper.     . insulin degludec (TRESIBA FLEXTOUCH) 100 UNIT/ML SOPN FlexTouch Pen Inject 40 Units into the skin every morning.     . isosorbide mononitrate (IMDUR) 30 MG 24 hr tablet Take 1 tablet by mouth once daily 30 tablet 0  . latanoprost (XALATAN) 0.005 % ophthalmic solution Place 1 drop into both eyes at bedtime.    . metolazone (ZAROXOLYN) 2.5 MG tablet Take ONLY as directed by CHF clinic. 5 tablet 0  . metoprolol succinate (TOPROL-XL) 25 MG 24 hr tablet Take 2 tablets (50 mg total) by mouth every morning AND 1 tablet (25 mg total) every evening. 90 tablet 6  . NUCYNTA ER 50 MG 12 hr tablet Take 50 mg by mouth every 12 (twelve) hours.  0  . repaglinide (PRANDIN) 0.5 MG tablet Take 0.5 mg by mouth 3 (three) times daily before meals.      . Rivaroxaban (XARELTO) 15 MG TABS tablet Take 1 tablet (15 mg total) by mouth daily with supper. 90 tablet 3  . torsemide (DEMADEX) 20 MG tablet Take 4 tablets (80 mg total) by mouth 2 (two) times daily. Take an additional 40mg  (2 tabs) as needed 240 tablet 3  . traMADol (ULTRAM) 50 MG tablet Take 50 mg by mouth every  6 (six) hours as needed for severe pain.     Marland Kitchen triamcinolone cream (KENALOG) 0.1 % Apply 1 application topically 2 (two) times daily.     No current facility-administered medications for this encounter.    Today's Vitals   09/18/18 1022  BP: (!) 152/80  Pulse: 77  SpO2: 93%  Weight: 93.4 kg (206 lb)   Body mass index is 37.68 kg/m. Wt Readings from Last 3 Encounters:  09/18/18 93.4 kg (206 lb)  07/18/18 92.2 kg (203 lb 4 oz)  07/13/18 92.1 kg (203 lb)   General: NAD Neck: JVP 8-9 cm, no thyromegaly or thyroid nodule.  Lungs: Clear to auscultation bilaterally with normal respiratory effort. CV: Nondisplaced PMI.  Heart regular S1/S2, no S3/S4, no murmur.  1+ edema 1/2 to knees bilaterally.  No carotid bruit.  Normal pedal pulses.  Abdomen: Soft, nontender, no hepatosplenomegaly, no distention.  Skin: Intact without lesions or rashes.  Neurologic: Alert and oriented x 3.  Psych: Normal affect. Extremities: No clubbing or cyanosis.  HEENT: Normal.   Assessment/Plan: 1. Chronic diastolic CHF: Echo in 1/61 with EF 50-55%, mild LVH, grade 2 diastolic dysfunction. 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 Cardiac output preserved. Echo (10/19) with EF 45-50%, septal-lateral dyssynchrony, RV looked ok.  PYP scan was not suggestive of transthyretin amyloidosis.  NYHA class IIIb symptoms, worse over the last week or so.  She looks volume overloaded on exam.  No fever/chills.  -  Increase torsemide to 80 mg bid and will have her take a 1 time dose of metolazone 2.5 mg with tomorrow morning's torsemide.  She will also take a dose of KCl 20 x 1 with metolazone tomorrow.  BMET today and again in 10 days.  - No spironolactone with CKD.  - Cardiomems denied by insurance, we have appealed.  2. COPD: She no longer smokes. Restrictive PFTs. High rest CT negative for ILD.  3. HTN: BP high today but has not taken her am meds.  - Increase Toprol XL to 50 qam/25 qpm.  4. CKD: Stage 3.  BMET today. 5. Atrial fibrillation: Paroxysmal.  She is in NSR today.  - Continue Xarelto 15 mg daily.  6. Pulmonary HTN: Moderate to severe pulmonary hypertension on 10/19 RHC. PVR 3.6 WU. This appeared to be primarily pulmonary venous hypertension.  - No treatment with selective pulmonary vasodilators for now.  7. Right hydronephrosis: Renal US showed single kidney with mild to moderate hydronephrosis on right (unchanged since 2011).   Followup in 3-4 weeks with APP.   Loralie Champagne, MD  09/18/2018

## 2018-09-18 NOTE — Patient Instructions (Addendum)
INCREASE Toprol to 50mg  (2 tabs) in the morning AND 25mg  (1 tab) in the evening  INCREASE Torsemide to 80mg  (4 tabs) twice a day  TAKE Metolazone 2.5mg  (1 tab) tomorrow   TAKE Potassium 41meq (1 tab) tomorrow  Labs today AND REPEAT in 10 days We will only contact you if something comes back abnormal or we need to make some changes. Otherwise no news is good news!  Your physician recommends that you schedule a follow-up appointment in: 3-4 weeks with Nurse Practitioner   At the [facility], you and your health needs are our priority. As part of our continuing mission to provide you with exceptional heart care, we have created designated Provider Care Teams. These Care Teams include your primary Cardiologist (physician) and Advanced Practice Providers (APPs- Physician Assistants and Nurse Practitioners) who all work together to provide you with the care you need, when you need it.   You may see any of the following providers on your designated Care Team at your next follow up: Marland Kitchen Dr Glori Bickers . Dr Loralie Champagne . Darrick Grinder, NP

## 2018-09-19 ENCOUNTER — Telehealth (HOSPITAL_COMMUNITY): Payer: Self-pay

## 2018-09-19 DIAGNOSIS — D72828 Other elevated white blood cell count: Secondary | ICD-10-CM

## 2018-09-19 NOTE — Telephone Encounter (Signed)
Pt aware of results. Per radiology, patient does not need an appointment.  She can present as walk-in.  Pt advised of same and verbalized understanding. She will go tomorrow for test.

## 2018-09-19 NOTE — Telephone Encounter (Signed)
-----   Message from Larey Dresser, MD sent at 09/18/2018  1:47 PM EDT ----- Stable BMET.  WBCs elevated.  She reported no fever today and had symptoms of CHF, but probably should get CXR to make sure no component of PNA.

## 2018-09-20 ENCOUNTER — Other Ambulatory Visit: Payer: Self-pay

## 2018-09-20 ENCOUNTER — Ambulatory Visit (HOSPITAL_COMMUNITY)
Admission: RE | Admit: 2018-09-20 | Discharge: 2018-09-20 | Disposition: A | Discharge status: Home / Self Care | Payer: PPO | Source: Ambulatory Visit | Attending: Cardiology | Admitting: Cardiology

## 2018-09-20 DIAGNOSIS — D72828 Other elevated white blood cell count: Secondary | ICD-10-CM | POA: Diagnosis not present

## 2018-09-20 DIAGNOSIS — R05 Cough: Secondary | ICD-10-CM | POA: Diagnosis not present

## 2018-09-20 DIAGNOSIS — R918 Other nonspecific abnormal finding of lung field: Secondary | ICD-10-CM | POA: Diagnosis not present

## 2018-09-28 ENCOUNTER — Ambulatory Visit (HOSPITAL_COMMUNITY)
Admission: RE | Admit: 2018-09-28 | Discharge: 2018-09-28 | Disposition: A | Discharge status: Home / Self Care | Payer: PPO | Source: Ambulatory Visit | Attending: Internal Medicine | Admitting: Internal Medicine

## 2018-09-28 ENCOUNTER — Other Ambulatory Visit: Payer: Self-pay

## 2018-09-28 DIAGNOSIS — I5022 Chronic systolic (congestive) heart failure: Secondary | ICD-10-CM | POA: Diagnosis not present

## 2018-09-28 LAB — BASIC METABOLIC PANEL
Anion gap: 12 (ref 5–15)
BUN: 43 mg/dL — ABNORMAL HIGH (ref 8–23)
CO2: 26 mmol/L (ref 22–32)
Calcium: 8.5 mg/dL — ABNORMAL LOW (ref 8.9–10.3)
Chloride: 104 mmol/L (ref 98–111)
Creatinine, Ser: 2.42 mg/dL — ABNORMAL HIGH (ref 0.44–1.00)
GFR calc Af Amer: 22 mL/min — ABNORMAL LOW (ref >60)
GFR calc non Af Amer: 19 mL/min — ABNORMAL LOW (ref >60)
Glucose, Bld: 98 mg/dL (ref 70–99)
Potassium: 3.6 mmol/L (ref 3.5–5.1)
Sodium: 142 mmol/L (ref 135–145)

## 2018-10-17 ENCOUNTER — Other Ambulatory Visit (HOSPITAL_COMMUNITY): Payer: Self-pay | Admitting: Cardiology

## 2018-10-18 ENCOUNTER — Encounter (HOSPITAL_COMMUNITY): Payer: PPO

## 2018-10-23 ENCOUNTER — Other Ambulatory Visit: Payer: Self-pay

## 2018-10-23 DIAGNOSIS — N183 Chronic kidney disease, stage 3 unspecified: Secondary | ICD-10-CM

## 2018-10-24 ENCOUNTER — Encounter (HOSPITAL_COMMUNITY): Payer: Self-pay

## 2018-10-24 ENCOUNTER — Other Ambulatory Visit: Payer: Self-pay

## 2018-10-24 ENCOUNTER — Ambulatory Visit (HOSPITAL_COMMUNITY)
Admission: RE | Admit: 2018-10-24 | Discharge: 2018-10-24 | Disposition: A | Discharge status: Home / Self Care | Payer: PPO | Source: Ambulatory Visit | Attending: Cardiology | Admitting: Cardiology

## 2018-10-24 VITALS — BP 160/74 | HR 86 | Wt 206.0 lb

## 2018-10-24 DIAGNOSIS — I480 Paroxysmal atrial fibrillation: Secondary | ICD-10-CM

## 2018-10-24 DIAGNOSIS — I10 Essential (primary) hypertension: Secondary | ICD-10-CM | POA: Diagnosis not present

## 2018-10-24 DIAGNOSIS — J449 Chronic obstructive pulmonary disease, unspecified: Secondary | ICD-10-CM | POA: Diagnosis not present

## 2018-10-24 DIAGNOSIS — Z79899 Other long term (current) drug therapy: Secondary | ICD-10-CM | POA: Insufficient documentation

## 2018-10-24 DIAGNOSIS — Z87891 Personal history of nicotine dependence: Secondary | ICD-10-CM | POA: Diagnosis not present

## 2018-10-24 DIAGNOSIS — I272 Pulmonary hypertension, unspecified: Secondary | ICD-10-CM | POA: Insufficient documentation

## 2018-10-24 DIAGNOSIS — Z9049 Acquired absence of other specified parts of digestive tract: Secondary | ICD-10-CM | POA: Diagnosis not present

## 2018-10-24 DIAGNOSIS — R9431 Abnormal electrocardiogram [ECG] [EKG]: Secondary | ICD-10-CM | POA: Insufficient documentation

## 2018-10-24 DIAGNOSIS — Z888 Allergy status to other drugs, medicaments and biological substances status: Secondary | ICD-10-CM | POA: Diagnosis not present

## 2018-10-24 DIAGNOSIS — M1990 Unspecified osteoarthritis, unspecified site: Secondary | ICD-10-CM | POA: Diagnosis not present

## 2018-10-24 DIAGNOSIS — M109 Gout, unspecified: Secondary | ICD-10-CM | POA: Diagnosis not present

## 2018-10-24 DIAGNOSIS — Z794 Long term (current) use of insulin: Secondary | ICD-10-CM | POA: Insufficient documentation

## 2018-10-24 DIAGNOSIS — E1122 Type 2 diabetes mellitus with diabetic chronic kidney disease: Secondary | ICD-10-CM | POA: Diagnosis not present

## 2018-10-24 DIAGNOSIS — Z809 Family history of malignant neoplasm, unspecified: Secondary | ICD-10-CM | POA: Diagnosis not present

## 2018-10-24 DIAGNOSIS — Z880 Allergy status to penicillin: Secondary | ICD-10-CM | POA: Diagnosis not present

## 2018-10-24 DIAGNOSIS — J45909 Unspecified asthma, uncomplicated: Secondary | ICD-10-CM | POA: Insufficient documentation

## 2018-10-24 DIAGNOSIS — E785 Hyperlipidemia, unspecified: Secondary | ICD-10-CM | POA: Insufficient documentation

## 2018-10-24 DIAGNOSIS — N184 Chronic kidney disease, stage 4 (severe): Secondary | ICD-10-CM | POA: Diagnosis not present

## 2018-10-24 DIAGNOSIS — I130 Hypertensive heart and chronic kidney disease with heart failure and stage 1 through stage 4 chronic kidney disease, or unspecified chronic kidney disease: Secondary | ICD-10-CM | POA: Diagnosis not present

## 2018-10-24 DIAGNOSIS — Z91012 Allergy to eggs: Secondary | ICD-10-CM | POA: Insufficient documentation

## 2018-10-24 DIAGNOSIS — I5042 Chronic combined systolic (congestive) and diastolic (congestive) heart failure: Secondary | ICD-10-CM | POA: Diagnosis not present

## 2018-10-24 DIAGNOSIS — Z7901 Long term (current) use of anticoagulants: Secondary | ICD-10-CM | POA: Diagnosis not present

## 2018-10-24 LAB — BASIC METABOLIC PANEL
Anion gap: 11 (ref 5–15)
BUN: 49 mg/dL — ABNORMAL HIGH (ref 8–23)
CO2: 26 mmol/L (ref 22–32)
Calcium: 8.6 mg/dL — ABNORMAL LOW (ref 8.9–10.3)
Chloride: 102 mmol/L (ref 98–111)
Creatinine, Ser: 2.32 mg/dL — ABNORMAL HIGH (ref 0.44–1.00)
GFR calc Af Amer: 23 mL/min — ABNORMAL LOW (ref >60)
GFR calc non Af Amer: 20 mL/min — ABNORMAL LOW (ref >60)
Glucose, Bld: 221 mg/dL — ABNORMAL HIGH (ref 70–99)
Potassium: 3.8 mmol/L (ref 3.5–5.1)
Sodium: 139 mmol/L (ref 135–145)

## 2018-10-24 MED ORDER — HYDRALAZINE HCL 100 MG PO TABS: 100.0000 mg | ORAL_TABLET | Freq: Three times a day (TID) | ORAL | 3 refills | Status: DC

## 2018-10-24 NOTE — Progress Notes (Signed)
[facility] Progress Note   10/24/2018 Stephanie Woodward   Sep 21, 1940  [id number]  Primary Physician Stephanie Woodward, L.Stephanie Sa, MD Primary Cardiologist: Stephanie Standing, MD  Heart Failure: Dr. Aundra Woodward  Electrophysiologist: None   Reason for Visit/CC: 1 month f/u for chronic systolic/ diastolic CHF and HTN   HPI:  Stephanie Woodward is a 78 y.o. female who is being seen today for 1 month f/u for combined systolic and diastolic HF as well as for HTN after recent medication adjustments made at last clinic visit 7/27.  To summarize her history, she has CKD stage 3, paroxysmal atrial fibrillation, chronic anticoagulation w/ Xarelto and chronic combined systolic and diastolic CHF was referred by Dr. Wynonia Woodward for evaluation of CHF.  RHC/LHC was done in 10/15.  This showed markedly elevated filling pressures and pulmonary venous hypertension.  There was no significant CAD.  Last echo in 2/18 showed EF 50-55%, mild LVH, grade 2 diastolic dysfunction. She is followed by Dr. Moshe Woodward for nephrology.   Admitted 10/2-10/6/19 with SOB. RHC showed elevated filling pressures and preserved CO. She was diuresed with IV lasix, then transitioned to torsemide 80 mg am, 40 mg pm. Repeat echo showed EF 45-50% with normal RV. PYP scan was negative for TTR amyloid. PFTs showed restrictive disease. CT chest showed no ILD but did show right hydronephrosis. Renal US showed single kidney with mild to moderate hydronephrosis on right (unchanged since 2011).  DC weight 208 lbs.   Last [facility] f/u was 09/18/18. Seen by Dr. Aundra Woodward. Endorsed increased cough and dyspnea. No fever or orthopnea. SOB walking 20-30 ft. Home weight up 3 lb from baseline. Pt noticed fluid buildup after torsemide dose was decreased to 60 BID so she increased back to 80 bid. Physical exam also c/w volume overload. BP was elevated at 152/80 but this was prior to her taking her AM meds. Dr. Aundra Woodward increased torsemide back to 80 mg BID and added  metolazone 2.5 mg x 1 with extra dose of Kdur. He also increased Toprol XL to 50 qam/25 qam. She presented back 1 week after diuretic increase for f/u BMP for monitoring of renal function and K and her SCr had improved, down from 2.53>>2.42. K was normal at 3.6.   She is here with her daughter. States she feels just a tad bit better but still experiencing exertional dyspnea and productive cough with frothy sputum. Checks temp at home and has been afebrile. Feels tired. Notes decreased appetite. Not eating much. Reports compliance w/ meds but BP remains high. 160/74. HR 86 bpm. SBPs also running high at home in the 150s-160s. Avoids salt. Weight is the same compared to last office visit at 206 lb. Her normal weight is ~202 lb.  1 missed dose of Xarelto in the last 30 days. No abnormal bleeding. Denies falls.   Cardiac Studies  2D Echo 11/23/2017 Study Conclusions  - Left ventricle: The cavity size was normal. Wall thickness was   increased in a pattern of mild LVH. Systolic function was mildly   reduced. The estimated ejection fraction was in the range of 45%   to 50%. Septal-lateral dyssynchrony with mild diffuse   hypokinesis. Features are consistent with a pseudonormal left   ventricular filling pattern, with concomitant abnormal relaxation   and increased filling pressure (grade 2 diastolic dysfunction). - Aortic valve: There was no stenosis. - Mitral valve: Mildly calcified annulus. There was trivial   regurgitation. - Left atrium: The atrium was mildly dilated. - Right ventricle: The  cavity size was normal. Systolic function   was normal. - Pulmonary arteries: No complete TR doppler jet so unable to   estimate PA systolic pressure. - Systemic veins: IVC poorly visualized.  Impressions:  - Normal LV size with mild LV hypertrophy. EF 45-50%, mild diffuse   hypokinesis with septal-lateral dyssynchrony. Normal RV size and   systolic function. No significant valvular abnormalities.   RHC 11/23/2017 Right Heart  Right Heart Pressures RHC Procedural Findings: Hemodynamics (mmHg) RA mean 13 RV 68/14 PA 74/27, mean 50 PCWP mean 28  Oxygen saturations: PA 68% AO 97%  Cardiac Output (Fick) 6.08  Cardiac Index (Fick) 3.12 PVR 3.6 WU      Current Meds  Medication Sig  . albuterol (PROVENTIL HFA;VENTOLIN HFA) 108 (90 BASE) MCG/ACT inhaler Inhale 2 puffs into the lungs every 4 (four) hours as needed for shortness of breath.   Marland Kitchen atorvastatin (LIPITOR) 80 MG tablet Take 80 mg by mouth daily.  . calcitRIOL (ROCALTROL) 0.25 MCG capsule Take 0.25 mcg by mouth daily.  . fluticasone (CUTIVATE) 0.005 % ointment Apply 1 application topically 2 (two) times daily.  Marland Kitchen guaiFENesin (MUCINEX) 600 MG 12 hr tablet Take 600 mg by mouth daily as needed for cough.  . hydrALAZINE (APRESOLINE) 100 MG tablet Take 1 tablet by mouth twice daily  . insulin aspart (NOVOLOG) 100 UNIT/ML injection Inject 10 Units into the skin 2 (two) times daily before lunch and supper.   . insulin degludec (TRESIBA FLEXTOUCH) 100 UNIT/ML SOPN FlexTouch Pen Inject 40 Units into the skin every morning.   . isosorbide mononitrate (IMDUR) 30 MG 24 hr tablet Take 1 tablet by mouth once daily  . latanoprost (XALATAN) 0.005 % ophthalmic solution Place 1 drop into both eyes at bedtime.  . metolazone (ZAROXOLYN) 2.5 MG tablet Take ONLY as directed by CHF clinic.  . metoprolol succinate (TOPROL-XL) 25 MG 24 hr tablet Take 2 tablets (50 mg total) by mouth every morning AND 1 tablet (25 mg total) every evening.  Gean Birchwood ER 50 MG 12 hr tablet Take 50 mg by mouth every 12 (twelve) hours.  . repaglinide (PRANDIN) 0.5 MG tablet Take 0.5 mg by mouth 3 (three) times daily before meals.   . Rivaroxaban (XARELTO) 15 MG TABS tablet Take 1 tablet (15 mg total) by mouth daily with supper.  . torsemide (DEMADEX) 20 MG tablet Take 4 tablets (80 mg total) by mouth 2 (two) times daily. Take an additional 40mg  (2 tabs) as needed  .  traMADol (ULTRAM) 50 MG tablet Take 50 mg by mouth every 6 (six) hours as needed for severe pain.   Marland Kitchen triamcinolone cream (KENALOG) 0.1 % Apply 1 application topically 2 (two) times daily.   Allergies  Allergen Reactions  . Eggs Or Egg-Derived Products Nausea And Vomiting  . Lisinopril Nausea And Vomiting  . Penicillins Nausea And Vomiting    Has patient had a PCN reaction causing immediate rash, facial/tongue/throat swelling, SOB or lightheadedness with hypotension: No Has patient had a PCN reaction causing severe rash involving mucus membranes or skin necrosis: No Has patient had a PCN reaction that required hospitalization: No Has patient had a PCN reaction occurring within the last 10 years: No If all of the above answers are "NO", then may proceed with Cephalosporin use.    Past Medical History:  Diagnosis Date  . Arthritis   . Asthma   . CHF (congestive heart failure) ([place])   . CKD (chronic kidney disease), stage III ([place])   .  COPD (chronic obstructive pulmonary disease) ([place])   . Diabetes mellitus    INSULIN DEPENDENT  . Gout   . Hyperlipemia   . Hypertension   . Psoriasis   . Renal disorder    congenital  . Single kidney    Family History  Problem Relation Age of Onset  . Lupus Daughter   . Cancer Mother 60  . CVA Father 67  . Cancer Brother 76   Past Surgical History:  Procedure Laterality Date  . ABDOMINAL HYSTERECTOMY    . CHOLECYSTECTOMY    . LEFT AND RIGHT HEART CATHETERIZATION WITH CORONARY ANGIOGRAM N/A 11/29/2013   Procedure: LEFT AND RIGHT HEART CATHETERIZATION WITH CORONARY ANGIOGRAM;  Surgeon: Jacolyn Reedy, MD;  Location: [facility] CATH LAB;  Service: Cardiovascular;  Laterality: N/A;  . RIGHT HEART CATH N/A 11/23/2017   Procedure: RIGHT HEART CATH;  Surgeon: Larey Dresser, MD;  Location: [facility] CV LAB;  Service: Cardiovascular;  Laterality: N/A;   Social History   Socioeconomic History  . Marital status: Widowed    Spouse name: Not on file   . Number of children: 2  . Years of education: Not on file  . Highest education level: Not on file  Occupational History  . Occupation: retired  Scientific laboratory technician  . Financial resource strain: Not on file  . Food insecurity    Worry: Not on file    Inability: Not on file  . Transportation needs    Medical: Not on file    Non-medical: Not on file  Tobacco Use  . Smoking status: Former Smoker    Packs/day: 1.00    Years: 20.00    Pack years: 20.00    Types: Cigarettes    Quit date: 02/22/2009    Years since quitting: 9.6  . Smokeless tobacco: Never Used  Substance and Sexual Activity  . Alcohol use: No    Alcohol/week: 0.0 standard drinks    Comment: quit 2011  . Drug use: No  . Sexual activity: Never  Lifestyle  . Physical activity    Days per week: Not on file    Minutes per session: Not on file  . Stress: Not on file  Relationships  . Social Herbalist on phone: Not on file    Gets together: Not on file    Attends religious service: Not on file    Active member of club or organization: Not on file    Attends meetings of clubs or organizations: Not on file    Relationship status: Not on file  . Intimate partner violence    Fear of current or ex partner: Not on file    Emotionally abused: Not on file    Physically abused: Not on file    Forced sexual activity: Not on file  Other Topics Concern  . Not on file  Social History Narrative  . Not on file     Lipid Panel     Component Value Date/Time   CHOL 205 (H) 03/27/2007 2136   TRIG 147 03/27/2007 2136   HDL 79 03/27/2007 2136   CHOLHDL 2.6 Ratio 03/27/2007 2136   VLDL 29 03/27/2007 2136   LDLCALC 97 03/27/2007 2136    Review of Systems: General: negative for chills, fever, night sweats or weight changes.  Cardiovascular: negative for chest pain, dyspnea on exertion, edema, orthopnea, palpitations, paroxysmal nocturnal dyspnea or shortness of breath Dermatological: negative for rash Respiratory:  negative for cough or wheezing Urologic: negative for  hematuria Abdominal: negative for nausea, vomiting, diarrhea, bright red blood per rectum, melena, or hematemesis Neurologic: negative for visual changes, syncope, or dizziness All other systems reviewed and are otherwise negative except as noted above.   Physical Exam:  Blood pressure (!) 160/74, pulse 86, weight 93.4 kg (206 lb), SpO2 95 %.  General appearance: alert, cooperative and no distress Neck: no carotid bruit and no JVD Lungs: clear to auscultation bilaterally Heart: regular rate and rhythm and 2/6 murmu LUSB Abdomen: soft, non-tender; bowel sounds normal; no masses,  no organomegaly Extremities: extremities normal, atraumatic, no cyanosis or edema Pulses: 2+ and symmetric Skin: Skin color, texture, turgor normal. No rashes or lesions Neurologic: Grossly normal  EKG SR w/ sinus arrhthymia 77 bpm -- personally reviewed   ASSESSMENT AND PLAN:   1. Combined Systolic and Diastolic CHF: wt the same as last OV at 206 lb, despite recent diuretic adjustment. No LEE on exam but notes decreasd appetite and mild abdominal fullness. Lungs are CTAB. No JVD. Still SOB w/ exertion. Attempted to get volume measurement w/ RedsVest but unable due to technical difficulties w/ device. Unable to get reading after multiple attempts. Will check BNP to help assess volume status. Will f/u on result. May need further adjustment of diuretic and possibly additional metolazone. Check BMP today as well. Continue daily weights.   Continue low salt diet   2. HTN: on hydralazine 100 BID, Imdur 30, Torsemide 80 BID and Toprol XL with increase at last OV to 50 qam/25 qhs. Intolerant to lisinopril (n/v). Has CKD.  -BP elevated today at 160/74. HR 70s -reports full med compliance and low salt diet -Will increase hydralazine to 100 mg TID - f/u in 2 weeks   3. CKD, Stage IV: followed by Dr. Moshe Woodward. SCr as high at 3.06 in 5/20 but has been trending  downward. BNP 7 days after recent torsemide increase down from 2.53>>2.42. repeat BMP again today.   4. PAF: EKG today shows SR w/ sinus arrhythmia. HR controlled at 77.  - Continue  blocker therapy w/ Coreg - Continue Xarleto for a/c   5. Chronic Anticoagulation: On Xarelto.  On reduced dose 15 mg daily due to CKD, GFR 22.  Denies abnormal bleeding. No falls.   6. COPD: no wheezing on exam.   7. Pulmonary Hypertension: Moderate to severe pulmonary hypertension on 10/19 RHC. PVR 3.6 WU. This appeared to be primarily pulmonary venous hypertension. Recently seen by Dr. Aundra Woodward. No treatment with selective pulmonary vasodilators for now.    Follow-Up in 2 weeks w/ APP   Stephanie Woodward Ladoris Gene, MHS [facility] 10/24/2018 2:20 PM

## 2018-10-24 NOTE — Patient Instructions (Signed)
INCREASE Hydralazine 100 mg, one tab three times daily  Labs today We will only contact you if something comes back abnormal or we need to make some changes. Otherwise no news is good news!  Your physician recommends that you schedule a follow-up appointment in: 2 weeks Do the following things EVERYDAY: 1) Weigh yourself in the morning before breakfast. Write it down and keep it in a log. 2) Take your medicines as prescribed 3) Eat low salt foods-Limit salt (sodium) to 2000 mg per day.  4) Stay as active as you can everyday 5) Limit all fluids for the day to less than 2 liters   At the [facility], you and your health needs are our priority. As part of our continuing mission to provide you with exceptional heart care, we have created designated Provider Care Teams. These Care Teams include your primary Cardiologist (physician) and Advanced Practice Providers (APPs- Physician Assistants and Nurse Practitioners) who all work together to provide you with the care you need, when you need it.   You may see any of the following providers on your designated Care Team at your next follow up: Marland Kitchen Dr Glori Bickers . Dr Loralie Champagne . Darrick Grinder, NP   Please be sure to bring in all your medications bottles to every appointment.

## 2018-10-26 ENCOUNTER — Other Ambulatory Visit (HOSPITAL_COMMUNITY): Payer: Self-pay | Admitting: Cardiology

## 2018-10-26 DIAGNOSIS — N179 Acute kidney failure, unspecified: Secondary | ICD-10-CM | POA: Diagnosis not present

## 2018-10-26 DIAGNOSIS — N184 Chronic kidney disease, stage 4 (severe): Secondary | ICD-10-CM | POA: Diagnosis not present

## 2018-10-26 DIAGNOSIS — D631 Anemia in chronic kidney disease: Secondary | ICD-10-CM | POA: Diagnosis not present

## 2018-10-26 DIAGNOSIS — R0600 Dyspnea, unspecified: Secondary | ICD-10-CM | POA: Diagnosis not present

## 2018-10-26 DIAGNOSIS — E875 Hyperkalemia: Secondary | ICD-10-CM | POA: Diagnosis not present

## 2018-10-26 DIAGNOSIS — N2581 Secondary hyperparathyroidism of renal origin: Secondary | ICD-10-CM | POA: Diagnosis not present

## 2018-10-26 DIAGNOSIS — I5022 Chronic systolic (congestive) heart failure: Secondary | ICD-10-CM

## 2018-10-26 DIAGNOSIS — E1122 Type 2 diabetes mellitus with diabetic chronic kidney disease: Secondary | ICD-10-CM | POA: Diagnosis not present

## 2018-10-26 DIAGNOSIS — I129 Hypertensive chronic kidney disease with stage 1 through stage 4 chronic kidney disease, or unspecified chronic kidney disease: Secondary | ICD-10-CM | POA: Diagnosis not present

## 2018-10-26 DIAGNOSIS — R110 Nausea: Secondary | ICD-10-CM | POA: Diagnosis not present

## 2018-10-31 ENCOUNTER — Telehealth: Payer: Self-pay | Admitting: *Deleted

## 2018-10-31 ENCOUNTER — Ambulatory Visit (HOSPITAL_COMMUNITY)
Admission: RE | Admit: 2018-10-31 | Discharge: 2018-10-31 | Disposition: A | Discharge status: Home / Self Care | Payer: PPO | Source: Ambulatory Visit | Attending: Vascular Surgery | Admitting: Vascular Surgery

## 2018-10-31 ENCOUNTER — Encounter: Payer: Self-pay | Admitting: *Deleted

## 2018-10-31 ENCOUNTER — Ambulatory Visit (INDEPENDENT_AMBULATORY_CARE_PROVIDER_SITE_OTHER): Payer: PPO | Admitting: Vascular Surgery

## 2018-10-31 ENCOUNTER — Other Ambulatory Visit: Payer: Self-pay | Admitting: *Deleted

## 2018-10-31 ENCOUNTER — Ambulatory Visit (INDEPENDENT_AMBULATORY_CARE_PROVIDER_SITE_OTHER)
Admission: RE | Admit: 2018-10-31 | Discharge: 2018-10-31 | Disposition: A | Discharge status: Home / Self Care | Payer: PPO | Source: Ambulatory Visit | Attending: Vascular Surgery | Admitting: Vascular Surgery

## 2018-10-31 ENCOUNTER — Other Ambulatory Visit: Payer: Self-pay

## 2018-10-31 ENCOUNTER — Encounter: Payer: Self-pay | Admitting: Vascular Surgery

## 2018-10-31 DIAGNOSIS — N183 Chronic kidney disease, stage 3 unspecified: Secondary | ICD-10-CM

## 2018-10-31 DIAGNOSIS — N184 Chronic kidney disease, stage 4 (severe): Secondary | ICD-10-CM | POA: Diagnosis not present

## 2018-10-31 NOTE — Telephone Encounter (Signed)
   Primary Cardiologist: Ezzard Standing, MD, Dr. Aundra Dubin  Chart reviewed as part of pre-operative protocol coverage. Patient was contacted 10/31/2018 in reference to pre-operative risk assessment for pending surgery as outlined below.  Stephanie Woodward was last seen on 10/24/18 by Lyda Jester PAC. Unfortunately, a BNP was not collected at that visit. She does report significant orthopnea that is concerning if she needs to remain supine during the procedure. She is unable to complete 4.0 METS due to back pain. She is walker dependent, but denies anginal symptoms. Heart catheterization in 2015 with nonobstructive disease. She has an upcoming appt with the Advanced Heart Failure clinic on 11/07/18. I will defer cardiac clearance to the provider that evaluates her. I am concerned that she may need titration of her diuretic therapy prior to the procedure.   Per our clinical pharmacy staff: Patient with diagnosis of afib on Xarelto for anticoagulation.    Procedure: LEFT ARM ARTERIOVENOUS FISTULA CREATION Date of procedure: 11/17/2018  CHADS2-VASc score of  6 (CHF, HTN, AGE, DM2, AGE, female)  CrCl 107ml/min  Per office protocol, patient can hold Xarelto for 3-4 days prior to procedure.    I will route this recommendation to the requesting party via Epic fax function and remove from pre-op pool.  Please call with questions.  Tami Lin Jidenna Figgs, PA 10/31/2018, 4:29 PM

## 2018-10-31 NOTE — Telephone Encounter (Signed)
Please comment on xarelto. 

## 2018-10-31 NOTE — Telephone Encounter (Signed)
1) What type of surgery is being performed?    LEFT ARM ARTERIOVENOUS FISTULA CREATION   2) When is this surgery scheduled?  September 25,2020   3) What type of clearance is required (Medical, Pharmacy or Both)?   PHARMACY, MEDICAL   4) Are there any medications that need to be held prior to surgery and how long? XARELTO 15MG    5) Practice name and name of physician performing surgery? [facility], DR. Monica Martinez  6) What is your office phone number?  Leota Jacobsen, BSN, RN Surgical / Triage Nurse [facility]   [phone]

## 2018-10-31 NOTE — Telephone Encounter (Signed)
Patient with diagnosis of afib on Xarelto for anticoagulation.    Procedure:  LEFT ARM ARTERIOVENOUS FISTULA CREATION Date of procedure: 11/17/2018  CHADS2-VASc score of  6 (CHF, HTN, AGE, DM2, AGE, female)  CrCl 26ml/min  Per office protocol, patient can hold Xarelto for 3-4 days prior to procedure.

## 2018-10-31 NOTE — Progress Notes (Signed)
Patient name: Stephanie Woodward MRN: [id number] DOB: 1940-08-16 Sex: female  REASON FOR CONSULT: New AVF access evaluation  HPI: Stephanie Woodward is a 78 y.o. female, with history of hypertension, hyperlipidemia, diabetes, COPD, stage IV chronic kidney disease as well as heart failure on Xarelto that presents for new AV fistula access evaluation.  Patient states she is right-handed.  She has never had any previous access in the past.  She has no chest wall implants or defibrillators.  She lives at home independently and does not work and is retired as a Programmer, applications.  She has no numbness or tingling in either hand at this time.  Has not started dialysis yet.  Followed by Dr. Moshe Cipro with nephrology.  She was having some nausea recently that has since improved with medication.  Past Medical History:  Diagnosis Date  . Arthritis   . Asthma   . CHF (congestive heart failure) ([place])   . CKD (chronic kidney disease), stage III ([place])   . COPD (chronic obstructive pulmonary disease) ([place])   . Diabetes mellitus    INSULIN DEPENDENT  . Gout   . Hyperlipemia   . Hypertension   . Psoriasis   . Renal disorder    congenital  . Single kidney     Past Surgical History:  Procedure Laterality Date  . ABDOMINAL HYSTERECTOMY    . CHOLECYSTECTOMY    . LEFT AND RIGHT HEART CATHETERIZATION WITH CORONARY ANGIOGRAM N/A 11/29/2013   Procedure: LEFT AND RIGHT HEART CATHETERIZATION WITH CORONARY ANGIOGRAM;  Surgeon: Jacolyn Reedy, MD;  Location: [facility] CATH LAB;  Service: Cardiovascular;  Laterality: N/A;  . RIGHT HEART CATH N/A 11/23/2017   Procedure: RIGHT HEART CATH;  Surgeon: Larey Dresser, MD;  Location: [facility] CV LAB;  Service: Cardiovascular;  Laterality: N/A;    Family History  Problem Relation Age of Onset  . Lupus Daughter   . Cancer Mother 37  . CVA Father 53  . Cancer Brother 26    SOCIAL HISTORY: Social History   Socioeconomic History  . Marital status: Widowed   Spouse name: Not on file  . Number of children: 2  . Years of education: Not on file  . Highest education level: Not on file  Occupational History  . Occupation: retired  Scientific laboratory technician  . Financial resource strain: Not on file  . Food insecurity    Worry: Not on file    Inability: Not on file  . Transportation needs    Medical: Not on file    Non-medical: Not on file  Tobacco Use  . Smoking status: Former Smoker    Packs/day: 1.00    Years: 20.00    Pack years: 20.00    Types: Cigarettes    Quit date: 02/22/2009    Years since quitting: 9.6  . Smokeless tobacco: Never Used  Substance and Sexual Activity  . Alcohol use: No    Alcohol/week: 0.0 standard drinks    Comment: quit 2011  . Drug use: No  . Sexual activity: Never  Lifestyle  . Physical activity    Days per week: Not on file    Minutes per session: Not on file  . Stress: Not on file  Relationships  . Social Herbalist on phone: Not on file    Gets together: Not on file    Attends religious service: Not on file    Active member of club or organization: Not on file  Attends meetings of clubs or organizations: Not on file    Relationship status: Not on file  . Intimate partner violence    Fear of current or ex partner: Not on file    Emotionally abused: Not on file    Physically abused: Not on file    Forced sexual activity: Not on file  Other Topics Concern  . Not on file  Social History Narrative  . Not on file    Allergies  Allergen Reactions  . Eggs Or Egg-Derived Products Nausea And Vomiting  . Lisinopril Nausea And Vomiting  . Penicillins Nausea And Vomiting    Has patient had a PCN reaction causing immediate rash, facial/tongue/throat swelling, SOB or lightheadedness with hypotension: No Has patient had a PCN reaction causing severe rash involving mucus membranes or skin necrosis: No Has patient had a PCN reaction that required hospitalization: No Has patient had a PCN reaction  occurring within the last 10 years: No If all of the above answers are "NO", then may proceed with Cephalosporin use.     Current Outpatient Medications  Medication Sig Dispense Refill  . albuterol (PROVENTIL HFA;VENTOLIN HFA) 108 (90 BASE) MCG/ACT inhaler Inhale 2 puffs into the lungs every 4 (four) hours as needed for shortness of breath.     Marland Kitchen atorvastatin (LIPITOR) 80 MG tablet Take 80 mg by mouth daily.    . calcitRIOL (ROCALTROL) 0.25 MCG capsule Take 0.25 mcg by mouth daily.    . fluticasone (CUTIVATE) 0.005 % ointment Apply 1 application topically 2 (two) times daily.  2  . guaiFENesin (MUCINEX) 600 MG 12 hr tablet Take 600 mg by mouth daily as needed for cough.    . hydrALAZINE (APRESOLINE) 100 MG tablet Take 1 tablet (100 mg total) by mouth 3 (three) times daily. 270 tablet 3  . insulin aspart (NOVOLOG) 100 UNIT/ML injection Inject 10 Units into the skin 2 (two) times daily before lunch and supper.     . insulin degludec (TRESIBA FLEXTOUCH) 100 UNIT/ML SOPN FlexTouch Pen Inject 40 Units into the skin every morning.     . isosorbide mononitrate (IMDUR) 30 MG 24 hr tablet Take 1 tablet by mouth once daily 30 tablet 0  . latanoprost (XALATAN) 0.005 % ophthalmic solution Place 1 drop into both eyes at bedtime.    . metolazone (ZAROXOLYN) 2.5 MG tablet Take ONLY as directed by CHF clinic. 5 tablet 0  . metoprolol succinate (TOPROL-XL) 25 MG 24 hr tablet Take 2 tablets (50 mg total) by mouth every morning AND 1 tablet (25 mg total) every evening. 90 tablet 6  . NUCYNTA ER 50 MG 12 hr tablet Take 50 mg by mouth every 12 (twelve) hours.  0  . ondansetron (ZOFRAN) 4 MG tablet Take 4 mg by mouth 2 (two) times daily as needed.    . pantoprazole (PROTONIX) 40 MG tablet     . repaglinide (PRANDIN) 0.5 MG tablet Take 0.5 mg by mouth 3 (three) times daily before meals.     . Rivaroxaban (XARELTO) 15 MG TABS tablet Take 1 tablet (15 mg total) by mouth daily with supper. 90 tablet 3  . torsemide  (DEMADEX) 20 MG tablet Take 4 tablets (80 mg total) by mouth 2 (two) times daily. Take an additional 40mg  (2 tabs) as needed 240 tablet 3  . traMADol (ULTRAM) 50 MG tablet Take 50 mg by mouth every 6 (six) hours as needed for severe pain.     Marland Kitchen triamcinolone cream (KENALOG) 0.1 % Apply  1 application topically 2 (two) times daily.     No current facility-administered medications for this visit.     REVIEW OF SYSTEMS:  [X]  denotes positive finding, [ ]  denotes negative finding Cardiac  Comments:  Chest pain or chest pressure:    Shortness of breath upon exertion: x   Short of breath when lying flat: x   Irregular heart rhythm:        Vascular    Pain in calf, thigh, or hip brought on by ambulation:    Pain in feet at night that wakes you up from your sleep:     Blood clot in your veins:    Leg swelling:         Pulmonary    Oxygen at home:    Productive cough:  x   Wheezing:  x       Neurologic    Sudden weakness in arms or legs:     Sudden numbness in arms or legs:     Sudden onset of difficulty speaking or slurred speech:    Temporary loss of vision in one eye:     Problems with dizziness:         Gastrointestinal    Blood in stool:     Vomited blood:         Genitourinary    Burning when urinating:     Blood in urine:        Psychiatric    Major depression:         Hematologic    Bleeding problems:    Problems with blood clotting too easily:        Skin    Rashes or ulcers:        Constitutional    Fever or chills:      PHYSICAL EXAM: Vitals:   10/31/18 1105  BP: (!) 145/72  Pulse: 87  Resp: 16  Temp: (!) 96.7 F (35.9 C)  TempSrc: Temporal  SpO2: 95%  Weight: 202 lb (91.6 kg)  Height: 5\' 3"  (1.6 m)    GENERAL: The patient is a well-nourished female, in no acute distress. The vital signs are documented above. CARDIAC: There is a regular rate and rhythm.  VASCULAR:  2+ palpable radial pulse bilaterally 2+ palpable brachial pulse bilaterally No  tissue loss upper extremities PULMONARY: There is good air exchange bilaterally without wheezing or rales. ABDOMEN: Soft and non-tender with normal pitched bowel sounds.  MUSCULOSKELETAL: There are no major deformities or cyanosis. NEUROLOGIC: No focal weakness or paresthesias are detected.  DATA:   I indpendently reviewed her noninvasive imaging she has triphasic waveforms in both upper extremities.  On the left which is her nondominant side she has an excellent cephalic vein from the wrist all the way up to the axilla.  Assessment/Plan:  78 year old female with stage IV chronic kidney disease presents for new AV fistula access evaluation.  She is right-hand dominant and in review of vein mapping appears to have a very nice cephalic vein in the left arm.  Discussed that I will evaluate this with ultrasound the OR potential could do radiocephalic versus brachiocephalic.  We will schedule next available date.  We will asked that she hold her Xarelto for 48 hours and will confirm with cardiology that is okay.   Marty Heck, MD [facility] Office: [phone] Pager: [phone]

## 2018-11-07 ENCOUNTER — Other Ambulatory Visit: Payer: Self-pay

## 2018-11-07 ENCOUNTER — Ambulatory Visit (HOSPITAL_COMMUNITY)
Admission: RE | Admit: 2018-11-07 | Discharge: 2018-11-07 | Disposition: A | Discharge status: Home / Self Care | Payer: PPO | Source: Ambulatory Visit | Attending: Adult Health | Admitting: Adult Health

## 2018-11-07 ENCOUNTER — Encounter (HOSPITAL_COMMUNITY): Payer: Self-pay

## 2018-11-07 VITALS — BP 156/70 | HR 91 | Wt 203.4 lb

## 2018-11-07 DIAGNOSIS — Z79899 Other long term (current) drug therapy: Secondary | ICD-10-CM | POA: Insufficient documentation

## 2018-11-07 DIAGNOSIS — M545 Low back pain: Secondary | ICD-10-CM | POA: Diagnosis not present

## 2018-11-07 DIAGNOSIS — M109 Gout, unspecified: Secondary | ICD-10-CM | POA: Diagnosis not present

## 2018-11-07 DIAGNOSIS — J449 Chronic obstructive pulmonary disease, unspecified: Secondary | ICD-10-CM | POA: Insufficient documentation

## 2018-11-07 DIAGNOSIS — Z87891 Personal history of nicotine dependence: Secondary | ICD-10-CM | POA: Diagnosis not present

## 2018-11-07 DIAGNOSIS — I272 Pulmonary hypertension, unspecified: Secondary | ICD-10-CM | POA: Insufficient documentation

## 2018-11-07 DIAGNOSIS — I130 Hypertensive heart and chronic kidney disease with heart failure and stage 1 through stage 4 chronic kidney disease, or unspecified chronic kidney disease: Secondary | ICD-10-CM | POA: Insufficient documentation

## 2018-11-07 DIAGNOSIS — I5032 Chronic diastolic (congestive) heart failure: Secondary | ICD-10-CM | POA: Insufficient documentation

## 2018-11-07 DIAGNOSIS — I2510 Atherosclerotic heart disease of native coronary artery without angina pectoris: Secondary | ICD-10-CM | POA: Diagnosis not present

## 2018-11-07 DIAGNOSIS — I10 Essential (primary) hypertension: Secondary | ICD-10-CM | POA: Diagnosis not present

## 2018-11-07 DIAGNOSIS — M5136 Other intervertebral disc degeneration, lumbar region: Secondary | ICD-10-CM | POA: Diagnosis not present

## 2018-11-07 DIAGNOSIS — E785 Hyperlipidemia, unspecified: Secondary | ICD-10-CM | POA: Insufficient documentation

## 2018-11-07 DIAGNOSIS — Z7951 Long term (current) use of inhaled steroids: Secondary | ICD-10-CM | POA: Diagnosis not present

## 2018-11-07 DIAGNOSIS — Z794 Long term (current) use of insulin: Secondary | ICD-10-CM | POA: Diagnosis not present

## 2018-11-07 DIAGNOSIS — I480 Paroxysmal atrial fibrillation: Secondary | ICD-10-CM | POA: Diagnosis not present

## 2018-11-07 DIAGNOSIS — Z7901 Long term (current) use of anticoagulants: Secondary | ICD-10-CM | POA: Diagnosis not present

## 2018-11-07 DIAGNOSIS — N1330 Unspecified hydronephrosis: Secondary | ICD-10-CM | POA: Diagnosis not present

## 2018-11-07 DIAGNOSIS — N184 Chronic kidney disease, stage 4 (severe): Secondary | ICD-10-CM

## 2018-11-07 DIAGNOSIS — Z5181 Encounter for therapeutic drug level monitoring: Secondary | ICD-10-CM | POA: Diagnosis not present

## 2018-11-07 DIAGNOSIS — E1122 Type 2 diabetes mellitus with diabetic chronic kidney disease: Secondary | ICD-10-CM | POA: Insufficient documentation

## 2018-11-07 DIAGNOSIS — I5042 Chronic combined systolic (congestive) and diastolic (congestive) heart failure: Secondary | ICD-10-CM

## 2018-11-07 DIAGNOSIS — M259 Joint disorder, unspecified: Secondary | ICD-10-CM | POA: Diagnosis not present

## 2018-11-07 NOTE — Patient Instructions (Signed)
Please follow up with the [facility] in 3 months.  At the [facility], you and your health needs are our priority. As part of our continuing mission to provide you with exceptional heart care, we have created designated Provider Care Teams. These Care Teams include your primary Cardiologist (physician) and Advanced Practice Providers (APPs- Physician Assistants and Nurse Practitioners) who all work together to provide you with the care you need, when you need it.   You may see any of the following providers on your designated Care Team at your next follow up: Marland Kitchen Dr Glori Bickers . Dr Loralie Champagne . Darrick Grinder, NP   Please be sure to bring in all your medications bottles to every appointment.

## 2018-11-07 NOTE — Progress Notes (Signed)
Advanced Heart Failure Clinic Note  PCP: Alroy Dust, L.Marlou Sa, MD HF Cardiology: Dr. Aundra Dubin  78 y.o.with history of CKD stage 3, paroxysmal atrial fibrillation, and chronic diastolic CHF was referred by Dr. Wynonia Lawman for evaluation of CHF.  RHC/LHC was done in 10/15.  This showed markedly elevated filling pressures and pulmonary venous hypertension.  There was no significant CAD.  Last echo in 2/18 showed EF 50-55%, mild LVH, grade 2 diastolic dysfunction. She is followed by Dr. Moshe Cipro for nephrology.   Admitted 10/2-10/6/19 with SOB. RHC showed elevated filling pressures and preserved CO. She was diuresed with IV lasix, then transitioned to torsemide 80 mg am, 40 mg pm. Repeat echo showed EF 45-50% with normal RV. PYP scan was negative for TTR amyloid. PFTs showed restrictive disease. CT chest showed no ILD but did show right hydronephrosis. Renal US showed single kidney with mild to moderate hydronephrosis on right (unchanged since 2011).  DC weight 208 lbs.   Today she returns for HF follow up.Overall feeling ok. She was having nausea but that has resolved. Recently saw nephrology and plans for fistula the end of the month.  SOB with exertion. Denies PND. Sleeps on a wedge chronically. Appetite ok. No fever or chills. Weight at home 201-203  pounds. Taking all medications but did not take any medications prior to the visit.   Labs (2/18): K 4, creatinine 1.79 Labs (10/19): K 4.5, creatinine 2.4 Labs (12/19): LDL 62 Labs (1/20): K 3.9, creatinine 2.72 Labs 10/24/18: K 3.8 Creatinine 2.3  PMH: 1. Atrial fibrillation: Paroxysmal.  2. HTN 3. 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  5. CKD stage 3 6. H/o IVCD 7. COPD: PFTs (10/19) actually showed severe restriction, concern for interstitial process.  CT chest (10/19) showed mild-moderate patchy air trapping but no evidence for interstitial lung disease.  8. Gout 9. Chronic primarily diastolic CHF: Echo (9/47) with EF 50-55%,  mild LVH, grade 2 diastolic dysfunction.  - LHC/RHC (10/15): small distal LAD but no discrete stenosis.  Mean RA 15, PA 55/20 mean 34, mean PCWP 31 => pulmonary venous hypertension.  - PYP scan (10/19): No evidence for TTR amyloidosis.  - Echo (10/19): EF 45-50%, mild LVH, normal RV size and systolic function.  - RHC (10/19): mean RA 13, PA 74/27 mean 50, mean PCWP 28, CI 3.12, PVR 3.6 WU. Pulmonary venous hypertension.  10. Sleep study 2016: Negative per patient.   Review of systems complete and found to be negative unless listed in HPI.    Social History   Socioeconomic History  . Marital status: Widowed    Spouse name: Not on file  . Number of children: 2  . Years of education: Not on file  . Highest education level: Not on file  Occupational History  . Occupation: retired  Scientific laboratory technician  . Financial resource strain: Not on file  . Food insecurity    Worry: Not on file    Inability: Not on file  . Transportation needs    Medical: Not on file    Non-medical: Not on file  Tobacco Use  . Smoking status: Former Smoker    Packs/day: 1.00    Years: 20.00    Pack years: 20.00    Types: Cigarettes    Quit date: 02/22/2009    Years since quitting: 9.7  . Smokeless tobacco: Never Used  Substance and Sexual Activity  . Alcohol use: No    Alcohol/week: 0.0 standard drinks    Comment: quit 2011  .  Drug use: No  . Sexual activity: Never  Lifestyle  . Physical activity    Days per week: Not on file    Minutes per session: Not on file  . Stress: Not on file  Relationships  . Social Herbalist on phone: Not on file    Gets together: Not on file    Attends religious service: Not on file    Active member of club or organization: Not on file    Attends meetings of clubs or organizations: Not on file    Relationship status: Not on file  . Intimate partner violence    Fear of current or ex partner: Not on file    Emotionally abused: Not on file    Physically abused: Not  on file    Forced sexual activity: Not on file  Other Topics Concern  . Not on file  Social History Narrative  . Not on file   Family History  Problem Relation Age of Onset  . Lupus Daughter   . Cancer Mother 71  . CVA Father 53  . Cancer Brother 45   Review of systems complete and found to be negative unless listed in HPI.    Current Outpatient Medications  Medication Sig Dispense Refill  . albuterol (PROVENTIL HFA;VENTOLIN HFA) 108 (90 BASE) MCG/ACT inhaler Inhale 2 puffs into the lungs every 4 (four) hours as needed for shortness of breath.     Marland Kitchen atorvastatin (LIPITOR) 80 MG tablet Take 80 mg by mouth daily.    . calcitRIOL (ROCALTROL) 0.25 MCG capsule Take 0.25 mcg by mouth daily.    . fluticasone (CUTIVATE) 0.005 % ointment Apply 1 application topically 2 (two) times daily.  2  . guaiFENesin (MUCINEX) 600 MG 12 hr tablet Take 600 mg by mouth daily as needed for cough.    . hydrALAZINE (APRESOLINE) 100 MG tablet Take 1 tablet (100 mg total) by mouth 3 (three) times daily. 270 tablet 3  . insulin aspart (NOVOLOG) 100 UNIT/ML injection Inject 10 Units into the skin 2 (two) times daily before lunch and supper.     . insulin degludec (TRESIBA FLEXTOUCH) 100 UNIT/ML SOPN FlexTouch Pen Inject 40 Units into the skin every morning.     . isosorbide mononitrate (IMDUR) 30 MG 24 hr tablet Take 1 tablet by mouth once daily 30 tablet 0  . latanoprost (XALATAN) 0.005 % ophthalmic solution Place 1 drop into both eyes at bedtime.    . metolazone (ZAROXOLYN) 2.5 MG tablet Take ONLY as directed by CHF clinic. 5 tablet 0  . metoprolol succinate (TOPROL-XL) 25 MG 24 hr tablet Take 2 tablets (50 mg total) by mouth every morning AND 1 tablet (25 mg total) every evening. 90 tablet 6  . ondansetron (ZOFRAN) 4 MG tablet Take 4 mg by mouth 2 (two) times daily as needed.    . pantoprazole (PROTONIX) 40 MG tablet     . repaglinide (PRANDIN) 0.5 MG tablet Take 0.5 mg by mouth 3 (three) times daily before  meals.     . Rivaroxaban (XARELTO) 15 MG TABS tablet Take 1 tablet (15 mg total) by mouth daily with supper. 90 tablet 3  . torsemide (DEMADEX) 20 MG tablet Take 4 tablets (80 mg total) by mouth 2 (two) times daily. Take an additional 40mg  (2 tabs) as needed 240 tablet 3  . traMADol (ULTRAM) 50 MG tablet Take 50 mg by mouth every 6 (six) hours as needed for severe pain.     Marland Kitchen  triamcinolone cream (KENALOG) 0.1 % Apply 1 application topically 2 (two) times daily.    Gean Birchwood ER 50 MG 12 hr tablet Take 50 mg by mouth every 12 (twelve) hours.  0   No current facility-administered medications for this encounter.    Today's Vitals   11/07/18 1157  BP: (!) 156/70  Pulse: 91  SpO2: 95%  Weight: 92.3 kg (203 lb 6.4 oz)   Body mass index is 36.03 kg/m. Wt Readings from Last 3 Encounters:  11/07/18 92.3 kg (203 lb 6.4 oz)  10/31/18 91.6 kg (202 lb)  10/24/18 93.4 kg (206 lb)   General:  Well appearing. No resp difficulty. Walked in the clinic with a rolling walker.  HEENT: normal Neck: supple. no JVD. Carotids 2+ bilat; no bruits. No lymphadenopathy or thryomegaly appreciated. Cor: PMI nondisplaced. Regular rate & rhythm. No rubs, gallops or murmurs. Lungs: clear Abdomen: soft, nontender, nondistended. No hepatosplenomegaly. No bruits or masses. Good bowel sounds. Extremities: no cyanosis, clubbing, rash, edema Neuro: alert & orientedx3, cranial nerves grossly intact. moves all 4 extremities w/o difficulty. Affect pleasant   Assessment/Plan: 1. Chronic diastolic CHF: Echo in 6/75 with EF 50-55%, mild LVH, grade 2 diastolic dysfunction. 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 Cardiac output preserved. Echo (10/19) with EF 45-50%, septal-lateral dyssynchrony, RV looked ok.  PYP scan was not  suggestive of transthyretin amyloidosis. - NYHA  III. Volume status stable. Continue current diuretic regimen.  - No spironolactone with CKD.  - Cardiomems denied by insurance, we have appealed.  2. COPD: She no longer smokes. Restrictive PFTs. High rest CT negative for ILD.  3. HTN: - Elevated but she has not had medications today. -Continue current regimen.   Toprol XL to 50 qam/25 qpm.  4. CKD: Stage 4.   I reviewed BMET from 10/24/18, stable. Planning for fistula the end of this month per Dr Moshe Cipro.  5. Atrial fibrillation: Paroxysmal. - regular pulse on exam.   - Continue Xarelto 15 mg daily.  6. Pulmonary HTN: Moderate to severe pulmonary hypertension on 10/19 RHC. PVR 3.6 WU. This appeared to be primarily pulmonary venous hypertension.  - No treatment with selective pulmonary vasodilators for now.  7. Right hydronephrosis: Renal US showed single kidney with mild to moderate hydronephrosis on right (unchanged since 2011).    Follow up in 3 months.  Darrick Grinder, NP  11/07/2018

## 2018-11-14 ENCOUNTER — Other Ambulatory Visit (HOSPITAL_COMMUNITY)
Admission: RE | Admit: 2018-11-14 | Discharge: 2018-11-14 | Disposition: A | Discharge status: Home / Self Care | Payer: PPO | Source: Ambulatory Visit | Attending: Vascular Surgery | Admitting: Vascular Surgery

## 2018-11-14 DIAGNOSIS — Z01812 Encounter for preprocedural laboratory examination: Secondary | ICD-10-CM | POA: Insufficient documentation

## 2018-11-14 DIAGNOSIS — Z20828 Contact with and (suspected) exposure to other viral communicable diseases: Secondary | ICD-10-CM | POA: Diagnosis not present

## 2018-11-15 LAB — NOVEL CORONAVIRUS, NAA (HOSP ORDER, SEND-OUT TO REF LAB; TAT 18-24 HRS): SARS-CoV-2, NAA: NOT DETECTED

## 2018-11-16 ENCOUNTER — Encounter (HOSPITAL_COMMUNITY): Payer: Self-pay | Admitting: *Deleted

## 2018-11-16 ENCOUNTER — Other Ambulatory Visit: Payer: Self-pay

## 2018-11-16 NOTE — Progress Notes (Signed)
Spoke with pt for pre-op call. Pt has hx of CHF and A-fib. Dr. Aundra Dubin is her cardiologist. Pt is on Xarelto and was instructed to hold it 2 days prior to surgery. Last dose was 11/14/18. Pt is a type 2 diabetic. Last A1C was 8.something per pt a year ago. Her PCP, Dr. Donnie Coffin follows her diabetes. Pt states her blood sugar has been dropping during the night, gets as low as 45. Pt concerned that since she is not able to eat or drink after midnight tonight that she will not know what to do. I instructed her to make sure she ate a snack before she goes to bed tonight and if her blood sugar drops after midnight, she may drink 1/2 cup of clear juice or gingerale (she has ginger ale, but will try to get some apple juice) and to recheck her blood sugar 15 minutes after drinking the juice. If it doesn't come back up to 58 or above to call the [facility] department and ask for a nurse. I told her a nurse would be here by 5:30 AM. Pt voiced understanding and was very appreciative that she had a plan in case her blood sugar drops tonight. Pt did state that Dr. Virgilio Belling office had instructed her earlier this week to stop taking her Prandin. She states she has stopped that, has not had it since 11/14/18.  Pt had Covid test done and it's negative. She states she has been in quarantine since having the test done. Pt informed of new visitation policy and voiced understanding.

## 2018-11-16 NOTE — Anesthesia Preprocedure Evaluation (Addendum)
Anesthesia Evaluation  Patient identified by MRN, date of birth, ID band Patient awake    Reviewed: Allergy & Precautions, NPO status , Patient's Chart, lab work & pertinent test results, reviewed documented beta blocker date and time   Airway Mallampati: II  TM Distance: >3 FB Neck ROM: Full    Dental  (+) Dental Advisory Given, Edentulous Upper, Edentulous Lower   Pulmonary asthma , sleep apnea , COPD, former smoker,    Pulmonary exam normal breath sounds clear to auscultation       Cardiovascular hypertension, Pt. on medications and Pt. on home beta blockers +CHF  Normal cardiovascular exam+ dysrhythmias Atrial Fibrillation  Rhythm:Regular Rate:Normal     Neuro/Psych negative neurological ROS     GI/Hepatic Neg liver ROS, GERD  Medicated,  Endo/Other  diabetes, Type 2, Insulin DependentObesity   Renal/GU Renal disease     Musculoskeletal negative musculoskeletal ROS (+)   Abdominal   Peds  Hematology  (+) Blood dyscrasia (Xarelto), ,   Anesthesia Other Findings Day of surgery medications reviewed with the patient.  Reproductive/Obstetrics                           Anesthesia Physical Anesthesia Plan  ASA: IV  Anesthesia Plan: MAC   Post-op Pain Management:    Induction: Intravenous  PONV Risk Score and Plan: 2 and Propofol infusion and Treatment may vary due to age or medical condition  Airway Management Planned: Nasal Cannula and Natural Airway  Additional Equipment:   Intra-op Plan:   Post-operative Plan:   Informed Consent: I have reviewed the patients History and Physical, chart, labs and discussed the procedure including the risks, benefits and alternatives for the proposed anesthesia with the patient or authorized representative who has indicated his/her understanding and acceptance.     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  [place] 11/23/17 showed significantly elevated right and left heart filling pressures and severe primarily pulmonary venous hypertension. Cardiac output preserved. Echo (10/19) with EF 45-50%, septal-lateral dyssynchrony, RV looked ok.   Last seen by Darrick Grinder, NP in HF clinic 11/07/18, noted pt had upcoming fistula surgery, stated pt's volume was stable and to continue current diuretic regimen.   CKD IV followed by Dr. Moshe Cipro.   TTE 11/23/17: - Normal LV size with mild LV hypertrophy. EF 45-50%, mild diffuse   hypokinesis with septal-lateral dyssynchrony. Normal RV size and   systolic function. No significant valvular abnormalities.)       Anesthesia Quick Evaluation

## 2018-11-17 ENCOUNTER — Ambulatory Visit (HOSPITAL_COMMUNITY): Payer: PPO | Admitting: Physician Assistant

## 2018-11-17 ENCOUNTER — Ambulatory Visit (HOSPITAL_COMMUNITY)
Admission: RE | Admit: 2018-11-17 | Discharge: 2018-11-17 | Disposition: A | Discharge status: Home / Self Care | Payer: PPO | Attending: Vascular Surgery | Admitting: Vascular Surgery

## 2018-11-17 ENCOUNTER — Other Ambulatory Visit: Payer: Self-pay

## 2018-11-17 ENCOUNTER — Encounter (HOSPITAL_COMMUNITY)
Admission: RE | Disposition: A | Discharge status: Home / Self Care | Payer: Self-pay | Source: Home / Self Care | Attending: Vascular Surgery

## 2018-11-17 ENCOUNTER — Encounter (HOSPITAL_COMMUNITY): Payer: Self-pay | Admitting: *Deleted

## 2018-11-17 DIAGNOSIS — Z79899 Other long term (current) drug therapy: Secondary | ICD-10-CM | POA: Insufficient documentation

## 2018-11-17 DIAGNOSIS — G4730 Sleep apnea, unspecified: Secondary | ICD-10-CM | POA: Diagnosis not present

## 2018-11-17 DIAGNOSIS — E1122 Type 2 diabetes mellitus with diabetic chronic kidney disease: Secondary | ICD-10-CM | POA: Insufficient documentation

## 2018-11-17 DIAGNOSIS — E669 Obesity, unspecified: Secondary | ICD-10-CM | POA: Insufficient documentation

## 2018-11-17 DIAGNOSIS — I509 Heart failure, unspecified: Secondary | ICD-10-CM | POA: Diagnosis not present

## 2018-11-17 DIAGNOSIS — K219 Gastro-esophageal reflux disease without esophagitis: Secondary | ICD-10-CM | POA: Insufficient documentation

## 2018-11-17 DIAGNOSIS — Z7901 Long term (current) use of anticoagulants: Secondary | ICD-10-CM | POA: Insufficient documentation

## 2018-11-17 DIAGNOSIS — I4891 Unspecified atrial fibrillation: Secondary | ICD-10-CM | POA: Insufficient documentation

## 2018-11-17 DIAGNOSIS — Z794 Long term (current) use of insulin: Secondary | ICD-10-CM | POA: Diagnosis not present

## 2018-11-17 DIAGNOSIS — E785 Hyperlipidemia, unspecified: Secondary | ICD-10-CM | POA: Insufficient documentation

## 2018-11-17 DIAGNOSIS — I5022 Chronic systolic (congestive) heart failure: Secondary | ICD-10-CM | POA: Diagnosis not present

## 2018-11-17 DIAGNOSIS — Q600 Renal agenesis, unilateral: Secondary | ICD-10-CM | POA: Diagnosis not present

## 2018-11-17 DIAGNOSIS — J449 Chronic obstructive pulmonary disease, unspecified: Secondary | ICD-10-CM | POA: Diagnosis not present

## 2018-11-17 DIAGNOSIS — Z87891 Personal history of nicotine dependence: Secondary | ICD-10-CM | POA: Insufficient documentation

## 2018-11-17 DIAGNOSIS — I130 Hypertensive heart and chronic kidney disease with heart failure and stage 1 through stage 4 chronic kidney disease, or unspecified chronic kidney disease: Secondary | ICD-10-CM | POA: Insufficient documentation

## 2018-11-17 DIAGNOSIS — M1990 Unspecified osteoarthritis, unspecified site: Secondary | ICD-10-CM | POA: Diagnosis not present

## 2018-11-17 DIAGNOSIS — Z6836 Body mass index (BMI) 36.0-36.9, adult: Secondary | ICD-10-CM | POA: Diagnosis not present

## 2018-11-17 DIAGNOSIS — N184 Chronic kidney disease, stage 4 (severe): Secondary | ICD-10-CM | POA: Diagnosis not present

## 2018-11-17 HISTORY — DX: Sleep apnea, unspecified: G47.30

## 2018-11-17 HISTORY — DX: Unspecified atrial fibrillation: I48.91

## 2018-11-17 HISTORY — DX: Cardiac murmur, unspecified: R01.1

## 2018-11-17 HISTORY — PX: AV FISTULA PLACEMENT: SHX1204

## 2018-11-17 HISTORY — DX: Personal history of urinary calculi: Z87.442

## 2018-11-17 LAB — POCT I-STAT, CHEM 8
BUN: 45 mg/dL — ABNORMAL HIGH (ref 8–23)
Calcium, Ion: 1.16 mmol/L (ref 1.15–1.40)
Chloride: 104 mmol/L (ref 98–111)
Creatinine, Ser: 2.6 mg/dL — ABNORMAL HIGH (ref 0.44–1.00)
Glucose, Bld: 68 mg/dL — ABNORMAL LOW (ref 70–99)
HCT: 37 % (ref 36.0–46.0)
Hemoglobin: 12.6 g/dL (ref 12.0–15.0)
Potassium: 3.6 mmol/L (ref 3.5–5.1)
Sodium: 143 mmol/L (ref 135–145)
TCO2: 27 mmol/L (ref 22–32)

## 2018-11-17 LAB — GLUCOSE, CAPILLARY
Glucose-Capillary: 71 mg/dL (ref 70–99)
Glucose-Capillary: 97 mg/dL (ref 70–99)
Glucose-Capillary: 110 mg/dL — ABNORMAL HIGH (ref 70–99)

## 2018-11-17 LAB — PROTIME-INR
INR: 1.1 (ref 0.8–1.2)
Prothrombin Time: 13.9 seconds (ref 11.4–15.2)

## 2018-11-17 SURGERY — ARTERIOVENOUS (AV) FISTULA CREATION
Anesthesia: Monitor Anesthesia Care | Site: Arm Upper | Laterality: Left

## 2018-11-17 MED ORDER — DEXTROSE 50 % IV SOLN
12.5000 g | INTRAVENOUS | Status: AC
  Administered 2018-11-17: 09:00:00 25 mL via INTRAVENOUS

## 2018-11-17 MED ORDER — DEXAMETHASONE SODIUM PHOSPHATE 10 MG/ML IJ SOLN
INTRAMUSCULAR | Status: DC | PRN
  Administered 2018-11-17: 5 mg via INTRAVENOUS

## 2018-11-17 MED ORDER — SODIUM CHLORIDE 0.9 % IV SOLN
INTRAVENOUS | Status: DC | PRN
  Administered 2018-11-17: 30 ug/min via INTRAVENOUS

## 2018-11-17 MED ORDER — PROPOFOL 10 MG/ML IV BOLUS
INTRAVENOUS | Status: AC
  Filled 2018-11-17: qty 20

## 2018-11-17 MED ORDER — SODIUM CHLORIDE 0.9 % IV SOLN
INTRAVENOUS | Status: AC
  Filled 2018-11-17: qty 1.2

## 2018-11-17 MED ORDER — ACETAMINOPHEN 500 MG PO TABS
ORAL_TABLET | ORAL | Status: AC
  Filled 2018-11-17: qty 2

## 2018-11-17 MED ORDER — SODIUM CHLORIDE 0.9 % IV SOLN
INTRAVENOUS | Status: DC
  Administered 2018-11-17: 09:00:00 via INTRAVENOUS

## 2018-11-17 MED ORDER — 0.9 % SODIUM CHLORIDE (POUR BTL) OPTIME
TOPICAL | Status: DC | PRN
  Administered 2018-11-17: 11:00:00 1000 mL

## 2018-11-17 MED ORDER — MIDAZOLAM HCL 2 MG/2ML IJ SOLN
INTRAMUSCULAR | Status: AC
  Filled 2018-11-17: qty 2

## 2018-11-17 MED ORDER — ONDANSETRON HCL 4 MG/2ML IJ SOLN
INTRAMUSCULAR | Status: AC
  Filled 2018-11-17: qty 2

## 2018-11-17 MED ORDER — FENTANYL CITRATE (PF) 250 MCG/5ML IJ SOLN
INTRAMUSCULAR | Status: AC
  Filled 2018-11-17: qty 5

## 2018-11-17 MED ORDER — PHENYLEPHRINE 40 MCG/ML (10ML) SYRINGE FOR IV PUSH (FOR BLOOD PRESSURE SUPPORT)
PREFILLED_SYRINGE | INTRAVENOUS | Status: DC | PRN
  Administered 2018-11-17 (×2): 80 ug via INTRAVENOUS

## 2018-11-17 MED ORDER — PAPAVERINE HCL 30 MG/ML IJ SOLN
INTRAMUSCULAR | Status: AC
  Filled 2018-11-17: qty 2

## 2018-11-17 MED ORDER — LIDOCAINE 2% (20 MG/ML) 5 ML SYRINGE
INTRAMUSCULAR | Status: DC | PRN
  Administered 2018-11-17: 80 mg via INTRAVENOUS

## 2018-11-17 MED ORDER — DEXTROSE 50 % IV SOLN
INTRAVENOUS | Status: AC
  Administered 2018-11-17: 25 mL via INTRAVENOUS
  Filled 2018-11-17: qty 50

## 2018-11-17 MED ORDER — LIDOCAINE 2% (20 MG/ML) 5 ML SYRINGE
INTRAMUSCULAR | Status: AC
  Filled 2018-11-17: qty 5

## 2018-11-17 MED ORDER — LIDOCAINE HCL (PF) 1 % IJ SOLN
INTRAMUSCULAR | Status: AC
  Filled 2018-11-17: qty 30

## 2018-11-17 MED ORDER — DEXAMETHASONE SODIUM PHOSPHATE 10 MG/ML IJ SOLN
INTRAMUSCULAR | Status: AC
  Filled 2018-11-17: qty 1

## 2018-11-17 MED ORDER — LIDOCAINE HCL 1 % IJ SOLN
INTRAMUSCULAR | Status: DC | PRN
  Administered 2018-11-17: 30 mL

## 2018-11-17 MED ORDER — PROPOFOL 1000 MG/100ML IV EMUL
INTRAVENOUS | Status: AC
  Filled 2018-11-17: qty 100

## 2018-11-17 MED ORDER — ACETAMINOPHEN 500 MG PO TABS
1000.0000 mg | ORAL_TABLET | Freq: Once | ORAL | Status: AC
  Administered 2018-11-17: 1000 mg via ORAL

## 2018-11-17 MED ORDER — FENTANYL CITRATE (PF) 250 MCG/5ML IJ SOLN
INTRAMUSCULAR | Status: DC | PRN
  Administered 2018-11-17: 25 ug via INTRAVENOUS

## 2018-11-17 MED ORDER — VANCOMYCIN HCL IN DEXTROSE 1-5 GM/200ML-% IV SOLN
1000.0000 mg | INTRAVENOUS | Status: AC
  Administered 2018-11-17: 1000 mg via INTRAVENOUS
  Filled 2018-11-17: qty 200

## 2018-11-17 MED ORDER — PROPOFOL 500 MG/50ML IV EMUL
INTRAVENOUS | Status: DC | PRN
  Administered 2018-11-17: 75 ug/kg/min via INTRAVENOUS

## 2018-11-17 MED ORDER — PAPAVERINE HCL 30 MG/ML IJ SOLN
INTRAMUSCULAR | Status: DC | PRN
  Administered 2018-11-17: 60 mg via INTRAMUSCULAR

## 2018-11-17 MED ORDER — HEPARIN SODIUM (PORCINE) 1000 UNIT/ML IJ SOLN
INTRAMUSCULAR | Status: DC | PRN
  Administered 2018-11-17: 3000 [IU] via INTRAVENOUS

## 2018-11-17 MED ORDER — SODIUM CHLORIDE 0.9 % IV SOLN
INTRAVENOUS | Status: DC | PRN
  Administered 2018-11-17: 500 mL

## 2018-11-17 MED ORDER — PROTAMINE SULFATE 10 MG/ML IV SOLN
INTRAVENOUS | Status: AC
  Filled 2018-11-17: qty 5

## 2018-11-17 MED ORDER — MIDAZOLAM HCL 5 MG/5ML IJ SOLN
INTRAMUSCULAR | Status: DC | PRN
  Administered 2018-11-17: 2 mg via INTRAVENOUS

## 2018-11-17 SURGICAL SUPPLY — 41 items
ADH SKN CLS APL DERMABOND .7 (GAUZE/BANDAGES/DRESSINGS) ×1
AGENT HMST SPONGE THK3/8 (HEMOSTASIS)
ARMBAND PINK RESTRICT EXTREMIT (MISCELLANEOUS) ×4 IMPLANT
CANISTER SUCT 3000ML PPV (MISCELLANEOUS) ×3 IMPLANT
CLIP VESOCCLUDE MED 6/CT (CLIP) ×3 IMPLANT
CLIP VESOCCLUDE SM WIDE 6/CT (CLIP) ×3 IMPLANT
COVER PROBE W GEL 5X96 (DRAPES) ×3 IMPLANT
COVER WAND RF STERILE (DRAPES) ×1 IMPLANT
DECANTER SPIKE VIAL GLASS SM (MISCELLANEOUS) ×3 IMPLANT
DERMABOND ADVANCED (GAUZE/BANDAGES/DRESSINGS) ×2
DERMABOND ADVANCED .7 DNX12 (GAUZE/BANDAGES/DRESSINGS) ×1 IMPLANT
ELECT REM PT RETURN 9FT ADLT (ELECTROSURGICAL) ×3
ELECTRODE REM PT RTRN 9FT ADLT (ELECTROSURGICAL) ×1 IMPLANT
GLOVE BIO SURGEON STRL SZ7.5 (GLOVE) ×3 IMPLANT
GLOVE BIOGEL PI IND STRL 8 (GLOVE) ×1 IMPLANT
GLOVE BIOGEL PI INDICATOR 8 (GLOVE) ×2
GLOVE SS BIOGEL STRL SZ 6.5 (GLOVE) IMPLANT
GLOVE SUPERSENSE BIOGEL SZ 6.5 (GLOVE) ×2
GOWN STRL REUS W/ TWL LRG LVL3 (GOWN DISPOSABLE) ×2 IMPLANT
GOWN STRL REUS W/ TWL XL LVL3 (GOWN DISPOSABLE) ×2 IMPLANT
GOWN STRL REUS W/TWL LRG LVL3 (GOWN DISPOSABLE) ×6
GOWN STRL REUS W/TWL XL LVL3 (GOWN DISPOSABLE) ×3
HEMOSTAT SPONGE AVITENE ULTRA (HEMOSTASIS) IMPLANT
KIT BASIN OR (CUSTOM PROCEDURE TRAY) ×3 IMPLANT
KIT TURNOVER KIT B (KITS) ×3 IMPLANT
LOOP VESSEL MINI RED (MISCELLANEOUS) ×2 IMPLANT
NDL 18GX1X1/2 (RX/OR ONLY) (NEEDLE) IMPLANT
NEEDLE 18GX1X1/2 (RX/OR ONLY) (NEEDLE) ×3 IMPLANT
NS IRRIG 1000ML POUR BTL (IV SOLUTION) ×3 IMPLANT
PACK CV ACCESS (CUSTOM PROCEDURE TRAY) ×3 IMPLANT
PAD ARMBOARD 7.5X6 YLW CONV (MISCELLANEOUS) ×6 IMPLANT
SUT MNCRL AB 4-0 PS2 18 (SUTURE) ×3 IMPLANT
SUT PROLENE 6 0 BV (SUTURE) ×5 IMPLANT
SUT PROLENE 7 0 BV 1 (SUTURE) IMPLANT
SUT VIC AB 3-0 SH 27 (SUTURE) ×3
SUT VIC AB 3-0 SH 27X BRD (SUTURE) ×1 IMPLANT
SYR 20ML LL LF (SYRINGE) ×2 IMPLANT
SYR 3ML LL SCALE MARK (SYRINGE) ×2 IMPLANT
TOWEL GREEN STERILE (TOWEL DISPOSABLE) ×3 IMPLANT
UNDERPAD 30X30 (UNDERPADS AND DIAPERS) ×3 IMPLANT
WATER STERILE IRR 1000ML POUR (IV SOLUTION) ×3 IMPLANT

## 2018-11-17 NOTE — H&P (Signed)
History and Physical Interval Note:  11/17/2018 9:46 AM  Stephanie Woodward  has presented today for surgery, with the diagnosis of CHRONIC KIDNEY DISEASE.  The various methods of treatment have been discussed with the patient and family. After consideration of risks, benefits and other options for treatment, the patient has consented to  Procedure(s): ARTERIOVENOUS (AV) FISTULA CREATION LEFT ARM (Left) as a surgical intervention.  The patient's history has been reviewed, patient examined, no change in status, stable for surgery.  I have reviewed the patient's chart and labs.  Questions were answered to the patient's satisfaction.    Left arm AVF  Marty Heck  Patient name: Stephanie Woodward            MRN: [id number]        DOB: [birth year]        Sex: female  REASON FOR CONSULT: New AVF access evaluation  HPI: Stephanie Woodward is a 78 y.o. female, with history of hypertension, hyperlipidemia, diabetes, COPD, stage IV chronic kidney disease as well as heart failure on Xarelto that presents for new AV fistula access evaluation.  Patient states she is right-handed.  She has never had any previous access in the past.  She has no chest wall implants or defibrillators.  She lives at home independently and does not work and is retired as a Programmer, applications.  She has no numbness or tingling in either hand at this time.  Has not started dialysis yet.  Followed by Dr. Moshe Cipro with nephrology.  She was having some nausea recently that has since improved with medication.      Past Medical History:  Diagnosis Date  . Arthritis   . Asthma   . CHF (congestive heart failure) ([place])   . CKD (chronic kidney disease), stage III ([place])   . COPD (chronic obstructive pulmonary disease) ([place])   . Diabetes mellitus    INSULIN DEPENDENT  . Gout   . Hyperlipemia   . Hypertension   . Psoriasis   . Renal disorder    congenital  . Single kidney          Past Surgical History:  Procedure  Laterality Date  . ABDOMINAL HYSTERECTOMY    . CHOLECYSTECTOMY    . LEFT AND RIGHT HEART CATHETERIZATION WITH CORONARY ANGIOGRAM N/A 11/29/2013   Procedure: LEFT AND RIGHT HEART CATHETERIZATION WITH CORONARY ANGIOGRAM;  Surgeon: Jacolyn Reedy, MD;  Location: [facility] CATH LAB;  Service: Cardiovascular;  Laterality: N/A;  . RIGHT HEART CATH N/A 11/23/2017   Procedure: RIGHT HEART CATH;  Surgeon: Larey Dresser, MD;  Location: [facility] CV LAB;  Service: Cardiovascular;  Laterality: N/A;         Family History  Problem Relation Age of Onset  . Lupus Daughter   . Cancer Mother 73  . CVA Father 90  . Cancer Brother 87    SOCIAL HISTORY: Social History        Socioeconomic History  . Marital status: Widowed    Spouse name: Not on file  . Number of children: 2  . Years of education: Not on file  . Highest education level: Not on file  Occupational History  . Occupation: retired  Scientific laboratory technician  . Financial resource strain: Not on file  . Food insecurity    Worry: Not on file    Inability: Not on file  . Transportation needs    Medical: Not on file    Non-medical: Not on file  Tobacco Use  .  Smoking status: Former Smoker    Packs/day: 1.00    Years: 20.00    Pack years: 20.00    Types: Cigarettes    Quit date: 02/22/2009    Years since quitting: 9.6  . Smokeless tobacco: Never Used  Substance and Sexual Activity  . Alcohol use: No    Alcohol/week: 0.0 standard drinks    Comment: quit 2011  . Drug use: No  . Sexual activity: Never  Lifestyle  . Physical activity    Days per week: Not on file    Minutes per session: Not on file  . Stress: Not on file  Relationships  . Social Herbalist on phone: Not on file    Gets together: Not on file    Attends religious service: Not on file    Active member of club or organization: Not on file    Attends meetings of clubs or organizations: Not on file    Relationship  status: Not on file  . Intimate partner violence    Fear of current or ex partner: Not on file    Emotionally abused: Not on file    Physically abused: Not on file    Forced sexual activity: Not on file  Other Topics Concern  . Not on file  Social History Narrative  . Not on file         Allergies  Allergen Reactions  . Eggs Or Egg-Derived Products Nausea And Vomiting  . Lisinopril Nausea And Vomiting  . Penicillins Nausea And Vomiting    Has patient had a PCN reaction causing immediate rash, facial/tongue/throat swelling, SOB or lightheadedness with hypotension: No Has patient had a PCN reaction causing severe rash involving mucus membranes or skin necrosis: No Has patient had a PCN reaction that required hospitalization: No Has patient had a PCN reaction occurring within the last 10 years: No If all of the above answers are "NO", then may proceed with Cephalosporin use.           Current Outpatient Medications  Medication Sig Dispense Refill  . albuterol (PROVENTIL HFA;VENTOLIN HFA) 108 (90 BASE) MCG/ACT inhaler Inhale 2 puffs into the lungs every 4 (four) hours as needed for shortness of breath.     Marland Kitchen atorvastatin (LIPITOR) 80 MG tablet Take 80 mg by mouth daily.    . calcitRIOL (ROCALTROL) 0.25 MCG capsule Take 0.25 mcg by mouth daily.    . fluticasone (CUTIVATE) 0.005 % ointment Apply 1 application topically 2 (two) times daily.  2  . guaiFENesin (MUCINEX) 600 MG 12 hr tablet Take 600 mg by mouth daily as needed for cough.    . hydrALAZINE (APRESOLINE) 100 MG tablet Take 1 tablet (100 mg total) by mouth 3 (three) times daily. 270 tablet 3  . insulin aspart (NOVOLOG) 100 UNIT/ML injection Inject 10 Units into the skin 2 (two) times daily before lunch and supper.     . insulin degludec (TRESIBA FLEXTOUCH) 100 UNIT/ML SOPN FlexTouch Pen Inject 40 Units into the skin every morning.     . isosorbide mononitrate (IMDUR) 30 MG 24 hr tablet Take 1 tablet by  mouth once daily 30 tablet 0  . latanoprost (XALATAN) 0.005 % ophthalmic solution Place 1 drop into both eyes at bedtime.    . metolazone (ZAROXOLYN) 2.5 MG tablet Take ONLY as directed by CHF clinic. 5 tablet 0  . metoprolol succinate (TOPROL-XL) 25 MG 24 hr tablet Take 2 tablets (50 mg total) by mouth every morning AND  1 tablet (25 mg total) every evening. 90 tablet 6  . NUCYNTA ER 50 MG 12 hr tablet Take 50 mg by mouth every 12 (twelve) hours.  0  . ondansetron (ZOFRAN) 4 MG tablet Take 4 mg by mouth 2 (two) times daily as needed.    . pantoprazole (PROTONIX) 40 MG tablet     . repaglinide (PRANDIN) 0.5 MG tablet Take 0.5 mg by mouth 3 (three) times daily before meals.     . Rivaroxaban (XARELTO) 15 MG TABS tablet Take 1 tablet (15 mg total) by mouth daily with supper. 90 tablet 3  . torsemide (DEMADEX) 20 MG tablet Take 4 tablets (80 mg total) by mouth 2 (two) times daily. Take an additional 40mg  (2 tabs) as needed 240 tablet 3  . traMADol (ULTRAM) 50 MG tablet Take 50 mg by mouth every 6 (six) hours as needed for severe pain.     Marland Kitchen triamcinolone cream (KENALOG) 0.1 % Apply 1 application topically 2 (two) times daily.     No current facility-administered medications for this visit.     REVIEW OF SYSTEMS:  [X]  denotes positive finding, [ ]  denotes negative finding Cardiac  Comments:  Chest pain or chest pressure:    Shortness of breath upon exertion: x   Short of breath when lying flat: x   Irregular heart rhythm:        Vascular    Pain in calf, thigh, or hip brought on by ambulation:    Pain in feet at night that wakes you up from your sleep:     Blood clot in your veins:    Leg swelling:         Pulmonary    Oxygen at home:    Productive cough:  x   Wheezing:  x       Neurologic    Sudden weakness in arms or legs:     Sudden numbness in arms or legs:     Sudden onset of difficulty speaking or slurred speech:     Temporary loss of vision in one eye:     Problems with dizziness:         Gastrointestinal    Blood in stool:     Vomited blood:         Genitourinary    Burning when urinating:     Blood in urine:        Psychiatric    Major depression:         Hematologic    Bleeding problems:    Problems with blood clotting too easily:        Skin    Rashes or ulcers:        Constitutional    Fever or chills:      PHYSICAL EXAM:    Vitals:   10/31/18 1105  BP: (!) 145/72  Pulse: 87  Resp: 16  Temp: (!) 96.7 F (35.9 C)  TempSrc: Temporal  SpO2: 95%  Weight: 202 lb (91.6 kg)  Height: 5\' 3"  (1.6 m)    GENERAL: The patient is a well-nourished female, in no acute distress. The vital signs are documented above. CARDIAC: There is a regular rate and rhythm.  VASCULAR:  2+ palpable radial pulse bilaterally 2+ palpable brachial pulse bilaterally No tissue loss upper extremities PULMONARY: There is good air exchange bilaterally without wheezing or rales. ABDOMEN: Soft and non-tender with normal pitched bowel sounds.  MUSCULOSKELETAL: There are no major deformities or cyanosis. NEUROLOGIC: No focal  weakness or paresthesias are detected.  DATA:   I indpendently reviewed her noninvasive imaging she has triphasic waveforms in both upper extremities.  On the left which is her nondominant side she has an excellent cephalic vein from the wrist all the way up to the axilla.  Assessment/Plan:  78 year old female with stage IV chronic kidney disease presents for new AV fistula access evaluation.  She is right-hand dominant and in review of vein mapping appears to have a very nice cephalic vein in the left arm.  Discussed that I will evaluate this with ultrasound the OR potential could do radiocephalic versus brachiocephalic.  We will schedule next available date.  We will asked that she hold her Xarelto for 48 hours and will  confirm with cardiology that is okay.   Marty Heck, MD [facility] Office: [phone] Pager: [phone]

## 2018-11-17 NOTE — Anesthesia Postprocedure Evaluation (Signed)
Anesthesia Post Note  Patient: Damiya Sandefur  Procedure(s) Performed: BRACHIO-CEPHALIC ARTERIOVENOUS (AV) FISTULA CREATION IN LEFT ARM (Left Arm Upper)     Patient location during evaluation: PACU Anesthesia Type: MAC Level of consciousness: awake and alert Pain management: pain level controlled Vital Signs Assessment: post-procedure vital signs reviewed and stable Respiratory status: spontaneous breathing, nonlabored ventilation and respiratory function stable Cardiovascular status: stable and blood pressure returned to baseline Postop Assessment: no apparent nausea or vomiting Anesthetic complications: no    Last Vitals:  Vitals:   11/17/18 1202 11/17/18 1215  BP: (!) 102/54 (!) 117/51  Pulse: 74 63  Resp: 18 19  Temp:    SpO2: 100% 100%    Last Pain:  Vitals:   11/17/18 1215  TempSrc:   PainSc: 4                  Catalina Gravel

## 2018-11-17 NOTE — Transfer of Care (Signed)
Immediate Anesthesia Transfer of Care Note  Patient: Stephanie Woodward  Procedure(s) Performed: BRACHIO-CEPHALIC ARTERIOVENOUS (AV) FISTULA CREATION IN LEFT ARM (Left Arm Upper)  Patient Location: PACU  Anesthesia Type:MAC  Level of Consciousness: awake, alert , oriented and patient cooperative  Airway & Oxygen Therapy: Patient Spontanous Breathing and Patient connected to nasal cannula oxygen  Post-op Assessment: Report given to RN, Post -op Vital signs reviewed and stable and Patient moving all extremities  Post vital signs: Reviewed and stable  Last Vitals:  Vitals Value Taken Time  BP 77/52 11/17/18 1157  Temp    Pulse 70 11/17/18 1201  Resp 19 11/17/18 1201  SpO2 95 % 11/17/18 1201  Vitals shown include unvalidated device data.  Last Pain:  Vitals:   11/17/18 1145  TempSrc:   PainSc: (P) 4       Patients Stated Pain Goal: 5 (90/90/30 1499)  Complications: No apparent anesthesia complications

## 2018-11-17 NOTE — Discharge Instructions (Signed)
° °  [facility]  Discharge Instructions  AV Fistula or Graft Surgery for Dialysis Access  Please refer to the following instructions for your post-procedure care. Your surgeon or physician assistant will discuss any changes with you.  Activity  You may drive the day following your surgery, if you are comfortable and no longer taking prescription pain medication. Resume full activity as the soreness in your incision resolves.  Bathing/Showering  You may shower after you go home. Keep your incision dry for 48 hours. Do not soak in a bathtub, hot tub, or swim until the incision heals completely. You may not shower if you have a hemodialysis catheter.  Incision Care  Clean your incision with mild soap and water after 48 hours. Pat the area dry with a clean towel. You do not need a bandage unless otherwise instructed. Do not apply any ointments or creams to your incision. You may have skin glue on your incision. Do not peel it off. It will come off on its own in about one week. Your arm may swell a bit after surgery. To reduce swelling use pillows to elevate your arm so it is above your heart. Your doctor will tell you if you need to lightly wrap your arm with an ACE bandage.  Diet  Resume your normal diet. There are not special food restrictions following this procedure. In order to heal from your surgery, it is CRITICAL to get adequate nutrition. Your body requires vitamins, minerals, and protein. Vegetables are the best source of vitamins and minerals. Vegetables also provide the perfect balance of protein. Processed food has little nutritional value, so try to avoid this.  Medications  Resume taking all of your medications. If your incision is causing pain, you may take over-the counter pain relievers such as acetaminophen (Tylenol). If you were prescribed a stronger pain medication, please be aware these medications can cause nausea and constipation. Prevent  nausea by taking the medication with a snack or meal. Avoid constipation by drinking plenty of fluids and eating foods with high amount of fiber, such as fruits, vegetables, and grains.  Do not take Tylenol if you are taking prescription pain medications.  Follow up Your surgeon may want to see you in the office following your access surgery. If so, this will be arranged at the time of your surgery.  Please call us immediately for any of the following conditions:  Increased pain, redness, drainage (pus) from your incision site Fever of 101 degrees or higher Severe or worsening pain at your incision site Hand pain or numbness.  Reduce your risk of vascular disease:  Stop smoking. If you would like help, call [facility] at 1-800-QUIT-NOW 732-101-8453) or [place] at [place] your cholesterol Maintain a desired weight Control your diabetes Keep your blood pressure down  Dialysis  It will take several weeks to several months for your new dialysis access to be ready for use. Your surgeon will determine when it is okay to use it. Your nephrologist will continue to direct your dialysis. You can continue to use your Permcath until your new access is ready for use.   11/17/2018 Stephanie Woodward [id number] 1940/05/07  Surgeon(s): Marty Heck, MD  Procedure(s): BRACHIO-CEPHALIC ARTERIOVENOUS (AV) FISTULA CREATION IN LEFT ARM  x Do not stick fistula for 12 weeks    If you have any questions, please call the office at [phone].

## 2018-11-17 NOTE — Op Note (Signed)
OPERATIVE NOTE   PROCEDURE: left brachiocephalic arteriovenous fistula placement  PRE-OPERATIVE DIAGNOSIS: chronic kidney disease stage IV  POST-OPERATIVE DIAGNOSIS: same as above   SURGEON: Marty Heck, MD  ASSISTANT(S): Leontine Locket, PA  ANESTHESIA: MAC  ESTIMATED BLOOD LOSS: Minimal  FINDING(S): 1.  Cephalic vein: 4 mm, acceptable 2.  Brachial artery: 3 mm, small, some atheroscleotic disease 3.  Venous outflow: faintly palpable thrill  4.  Radial flow: dopplerable radial signal  SPECIMEN(S):  none  INDICATIONS:   Stephanie Woodward is a 78 y.o. female who presents with stage IV CKD and needs permanent hemodialysis access.  The patient is scheduled for left brachiocephalic arteriovenous fistula placement.  The patient is aware the risks include but are not limited to: bleeding, infection, steal syndrome, nerve damage, ischemic monomelic neuropathy, failure to mature, and need for additional procedures.  The patient is aware of the risks of the procedure and elects to proceed forward.   DESCRIPTION: After full informed written consent was obtained from the patient, the patient was brought back to the operating room and placed supine upon the operating table.  Prior to induction, the patient received IV antibiotics.   After obtaining adequate anesthesia, the patient was then prepped and draped in the standard fashion for a left arm access procedure.  I turned my attention first to identifying the patient's cephalic vein and brachial artery.  Using SonoSite guidance, the location of these vessels were marked out on the skin.     At this point, I injected local anesthetic to obtain a field block of the antecubitum.  In total, I injected about 10 mL of 1% lidocaine without epinephrine.  I made a transverse incision below the level of the antecubitum and dissected through the subcutaneous tissue and fascia to gain exposure of the brachial artery.  This was noted to be 3 mm in  diameter externally.  We were also at the branch point of the radial and ulnar arteries and each of these was controlled with vessel loops.  This was dissected out proximally and controlled with vessel loops .  I then dissected out the cephalic vein.  This was noted to be 4 mm in diameter externally.  The distal segment of the vein was ligated with a  2-0 silk, and the vein was transected.  The proximal segment was interrogated with serial dilators.  The vein accepted up to a 4.5 mm dilator without any difficulty.  I then instilled the heparinized saline into the vein and clamped it.  At this point, I reset my exposure of the brachial artery and placed the artery under tension proximally and distally.  I made an arteriotomy with a #11 blade, and then I extended the arteriotomy with a Potts scissor.  I injected heparinized saline proximal and distal to this arteriotomy.  The vein was then sewn to the artery in an end-to-side configuration with a running stitch of 6-0 Prolene.  Prior to completing this anastomosis, I allowed the vein and artery to backbleed.  There was no evidence of clot from any vessels.  I completed the anastomosis in the usual fashion and then released all vessel loops and clamps.    There was a faintly palpable thrill in the venous outflow, and there was a dopplerable radial signal.  At this point, I irrigated out the surgical wound.  There was no further active bleeding.  The subcutaneous tissue was reapproximated with a running stitch of 3-0 Vicryl.  The skin was then  reapproximated with a running subcuticular stitch of 4-0 Monocryl.  The skin was then cleaned, dried, and reinforced with Dermabond.  The patient tolerated this procedure well.   COMPLICATIONS: None  CONDITION: Stable   Marty Heck, MD [facility] Office: [phone] Pager: [phone]  11/17/2018, 11:46 AM

## 2018-11-18 ENCOUNTER — Encounter (HOSPITAL_COMMUNITY): Payer: Self-pay | Admitting: Vascular Surgery

## 2018-11-22 ENCOUNTER — Other Ambulatory Visit (HOSPITAL_COMMUNITY): Payer: Self-pay | Admitting: Cardiology

## 2018-11-27 ENCOUNTER — Other Ambulatory Visit (HOSPITAL_COMMUNITY): Payer: Self-pay

## 2018-11-27 MED ORDER — TORSEMIDE 20 MG PO TABS: 80.0000 mg | ORAL_TABLET | Freq: Three times a day (TID) | ORAL | 3 refills | Status: DC

## 2018-12-20 ENCOUNTER — Other Ambulatory Visit: Payer: Self-pay

## 2018-12-20 DIAGNOSIS — N184 Chronic kidney disease, stage 4 (severe): Secondary | ICD-10-CM

## 2018-12-25 ENCOUNTER — Telehealth (HOSPITAL_COMMUNITY): Payer: Self-pay

## 2018-12-25 ENCOUNTER — Other Ambulatory Visit (HOSPITAL_COMMUNITY): Payer: Self-pay | Admitting: Cardiology

## 2018-12-25 NOTE — Telephone Encounter (Signed)

## 2018-12-26 ENCOUNTER — Ambulatory Visit (INDEPENDENT_AMBULATORY_CARE_PROVIDER_SITE_OTHER): Payer: Self-pay | Admitting: Vascular Surgery

## 2018-12-26 ENCOUNTER — Telehealth (HOSPITAL_COMMUNITY): Payer: Self-pay | Admitting: *Deleted

## 2018-12-26 ENCOUNTER — Other Ambulatory Visit: Payer: Self-pay

## 2018-12-26 ENCOUNTER — Encounter: Payer: Self-pay | Admitting: Vascular Surgery

## 2018-12-26 ENCOUNTER — Ambulatory Visit (HOSPITAL_COMMUNITY)
Admission: RE | Admit: 2018-12-26 | Discharge: 2018-12-26 | Disposition: A | Discharge status: Home / Self Care | Payer: PPO | Source: Ambulatory Visit | Attending: Vascular Surgery | Admitting: Vascular Surgery

## 2018-12-26 VITALS — BP 139/66 | HR 82 | Temp 96.7°F | Resp 16 | Ht 63.0 in | Wt 210.0 lb

## 2018-12-26 DIAGNOSIS — N184 Chronic kidney disease, stage 4 (severe): Secondary | ICD-10-CM | POA: Insufficient documentation

## 2018-12-26 NOTE — Telephone Encounter (Signed)
Received VM that patient was sob and requesting an appt. Called back to get more information. No answer. Will try patient again tomorrow.

## 2018-12-26 NOTE — Progress Notes (Signed)
Patient name: Stephanie Woodward MRN: [id number] DOB: 05-Nov-1940 Sex: female  REASON FOR VISIT: Follow-up after left brachiocephalic AV fistula  HPI: [name] is a 78 y.o. female presents for 1 month follow-up after left brachiocephalic AV fistula 2/87/8676 in the setting of stage IV CKD.  She reports no issues with the fistula itself.  Her incision has healed without issue.  Her hand feels good with no numbness or tingling.  She has not started dialysis.  Followed by Dr. Moshe Cipro reports she may need dialysis by December based on her last visit.  Past Medical History:  Diagnosis Date  . Arthritis   . Asthma   . Atrial fibrillation ([place])   . CHF (congestive heart failure) ([place])   . CKD (chronic kidney disease), stage III   . COPD (chronic obstructive pulmonary disease) ([place])   . Diabetes mellitus    INSULIN DEPENDENT  . Gout   . Heart murmur    no issues per pt  . History of kidney stones   . Hyperlipemia   . Hypertension   . Psoriasis   . Renal disorder    congenital  . Single kidney   . Sleep apnea    doesn't use the Cpap    Past Surgical History:  Procedure Laterality Date  . ABDOMINAL HYSTERECTOMY    . AV FISTULA PLACEMENT Left 11/17/2018   Procedure: BRACHIO-CEPHALIC ARTERIOVENOUS (AV) FISTULA CREATION IN LEFT ARM;  Surgeon: Marty Heck, MD;  Location: [facility];  Service: Vascular;  Laterality: Left;  . CHOLECYSTECTOMY    . LEFT AND RIGHT HEART CATHETERIZATION WITH CORONARY ANGIOGRAM N/A 11/29/2013   Procedure: LEFT AND RIGHT HEART CATHETERIZATION WITH CORONARY ANGIOGRAM;  Surgeon: Jacolyn Reedy, MD;  Location: [facility] CATH LAB;  Service: Cardiovascular;  Laterality: N/A;  . RIGHT HEART CATH N/A 11/23/2017   Procedure: RIGHT HEART CATH;  Surgeon: Larey Dresser, MD;  Location: [place] CV LAB;  Service: Cardiovascular;  Laterality: N/A;    Family History  Problem Relation Age of Onset  . Lupus Daughter   . Cancer Mother 26  . CVA Father 47  . Cancer  Brother 3    SOCIAL HISTORY: Social History   Tobacco Use  . Smoking status: Former Smoker    Packs/day: 1.00    Years: 20.00    Pack years: 20.00    Types: Cigarettes    Quit date: 02/22/2009    Years since quitting: 9.8  . Smokeless tobacco: Never Used  Substance Use Topics  . Alcohol use: Yes    Alcohol/week: 0.0 standard drinks    Comment: occasional beer    Allergies  Allergen Reactions  . Eggs Or Egg-Derived Products Nausea And Vomiting  . Lisinopril Nausea And Vomiting  . Penicillins Nausea And Vomiting    Has patient had a PCN reaction causing immediate rash, facial/tongue/throat swelling, SOB or lightheadedness with hypotension: No Has patient had a PCN reaction causing severe rash involving mucus membranes or skin necrosis: No Has patient had a PCN reaction that required hospitalization: No Has patient had a PCN reaction occurring within the last 10 years: No If all of the above answers are "NO", then may proceed with Cephalosporin use.     Current Outpatient Medications  Medication Sig Dispense Refill  . albuterol (PROVENTIL HFA;VENTOLIN HFA) 108 (90 BASE) MCG/ACT inhaler Inhale 2 puffs into the lungs every 4 (four) hours as needed for shortness of breath.     Marland Kitchen atorvastatin (LIPITOR) 80 MG tablet Take 80  mg by mouth every evening.     . calcitRIOL (ROCALTROL) 0.25 MCG capsule Take 0.25 mcg by mouth daily.    . fluticasone (CUTIVATE) 0.005 % ointment Apply 1 application topically 2 (two) times daily as needed (skin irritation/rash).   2  . guaiFENesin (MUCINEX) 600 MG 12 hr tablet Take 600 mg by mouth 2 (two) times daily as needed for cough.     . hydrALAZINE (APRESOLINE) 100 MG tablet Take 1 tablet (100 mg total) by mouth 3 (three) times daily. 270 tablet 3  . insulin aspart (NOVOLOG) 100 UNIT/ML injection Inject 10 Units into the skin 2 (two) times daily before lunch and supper.     . insulin degludec (TRESIBA FLEXTOUCH) 100 UNIT/ML SOPN FlexTouch Pen Inject 40  Units into the skin every morning.     . isosorbide mononitrate (IMDUR) 30 MG 24 hr tablet Take 1 tablet by mouth once daily 30 tablet 0  . latanoprost (XALATAN) 0.005 % ophthalmic solution Place 1 drop into both eyes at bedtime.    . metoprolol succinate (TOPROL-XL) 25 MG 24 hr tablet Take 2 tablets (50 mg total) by mouth every morning AND 1 tablet (25 mg total) every evening. 90 tablet 6  . NUCYNTA ER 50 MG 12 hr tablet Take 50 mg by mouth every 12 (twelve) hours.  0  . ondansetron (ZOFRAN) 4 MG tablet Take 4 mg by mouth 2 (two) times daily as needed for nausea.     . pantoprazole (PROTONIX) 40 MG tablet Take 40 mg by mouth daily.     . Rivaroxaban (XARELTO) 15 MG TABS tablet Take 1 tablet (15 mg total) by mouth daily with supper. 90 tablet 3  . torsemide (DEMADEX) 20 MG tablet Take 4 tablets (80 mg total) by mouth 3 (three) times daily. Take an additional 40mg  (2 tabs) as needed 240 tablet 3  . traMADol (ULTRAM) 50 MG tablet Take 50 mg by mouth every 6 (six) hours as needed for severe pain (for pain.).     Marland Kitchen triamcinolone cream (KENALOG) 0.1 % Apply 1 application topically 2 (two) times daily as needed (skin rash/irritation.).     Marland Kitchen repaglinide (PRANDIN) 1 MG tablet Take 2 mg by mouth 3 (three) times daily before meals.     No current facility-administered medications for this visit.     REVIEW OF SYSTEMS:  [X]  denotes positive finding, [ ]  denotes negative finding Cardiac  Comments:  Chest pain or chest pressure:    Shortness of breath upon exertion:    Short of breath when lying flat:    Irregular heart rhythm:        Vascular    Pain in calf, thigh, or hip brought on by ambulation:    Pain in feet at night that wakes you up from your sleep:     Blood clot in your veins:    Leg swelling:         Pulmonary    Oxygen at home:    Productive cough:     Wheezing:         Neurologic    Sudden weakness in arms or legs:     Sudden numbness in arms or legs:     Sudden onset of  difficulty speaking or slurred speech:    Temporary loss of vision in one eye:     Problems with dizziness:         Gastrointestinal    Blood in stool:     Vomited blood:  Genitourinary    Burning when urinating:     Blood in urine:        Psychiatric    Major depression:         Hematologic    Bleeding problems:    Problems with blood clotting too easily:        Skin    Rashes or ulcers:        Constitutional    Fever or chills:      PHYSICAL EXAM: Vitals:   12/26/18 1518  BP: 139/66  Pulse: 82  Resp: 16  Temp: (!) 96.7 F (35.9 C)  TempSrc: Temporal  SpO2: 97%  Weight: 210 lb (95.3 kg)  Height: 5\' 3"  (1.6 m)    GENERAL: The patient is a well-nourished female, in no acute distress. The vital signs are documented above. CARDIAC: There is a regular rate and rhythm.  VASCULAR:  Palpable left radial pulse at the wrist. Left brachiocephalic AV fistula with good thrill. Left arm incision well healed.  DATA:   Dialysis duplex reviewed and adequate flow volumes and vein appears greater than 6 mm in diameter and appropriate depth  Assessment/Plan:  78 year old female status post left brachiocephalic AV fistula 0/24/0973 for stage IV CKD.  She seems to be doing very well today and the fistula has a nice thrill.  This is maturing nicely based on her fistula duplex today.  Discussed that I would like to get her to the end of November which will be 8 weeks postop before using the fistula and preferably 3 months if possible to allow full maturation.  Patient to follow-up as needed.   Marty Heck, MD [facility] Office: [phone] Pager: [phone]

## 2018-12-27 NOTE — Telephone Encounter (Signed)
Tried patient again no answer.

## 2019-01-02 DIAGNOSIS — M545 Low back pain: Secondary | ICD-10-CM | POA: Diagnosis not present

## 2019-01-02 DIAGNOSIS — Z5181 Encounter for therapeutic drug level monitoring: Secondary | ICD-10-CM | POA: Diagnosis not present

## 2019-01-02 DIAGNOSIS — N184 Chronic kidney disease, stage 4 (severe): Secondary | ICD-10-CM | POA: Diagnosis not present

## 2019-01-02 DIAGNOSIS — D631 Anemia in chronic kidney disease: Secondary | ICD-10-CM | POA: Diagnosis not present

## 2019-01-02 DIAGNOSIS — I129 Hypertensive chronic kidney disease with stage 1 through stage 4 chronic kidney disease, or unspecified chronic kidney disease: Secondary | ICD-10-CM | POA: Diagnosis not present

## 2019-01-02 DIAGNOSIS — M5136 Other intervertebral disc degeneration, lumbar region: Secondary | ICD-10-CM | POA: Diagnosis not present

## 2019-01-02 DIAGNOSIS — E1122 Type 2 diabetes mellitus with diabetic chronic kidney disease: Secondary | ICD-10-CM | POA: Diagnosis not present

## 2019-01-02 DIAGNOSIS — M259 Joint disorder, unspecified: Secondary | ICD-10-CM | POA: Diagnosis not present

## 2019-01-23 ENCOUNTER — Encounter (HOSPITAL_COMMUNITY): Payer: Self-pay

## 2019-01-23 ENCOUNTER — Emergency Department (HOSPITAL_COMMUNITY): Payer: HMO

## 2019-01-23 ENCOUNTER — Inpatient Hospital Stay (HOSPITAL_COMMUNITY)
Admission: EM | Admit: 2019-01-23 | Discharge: 2019-01-31 | DRG: 674 | Disposition: A | Discharge status: Home / Self Care | Payer: HMO | Attending: Internal Medicine | Admitting: Internal Medicine

## 2019-01-23 ENCOUNTER — Other Ambulatory Visit: Payer: Self-pay

## 2019-01-23 ENCOUNTER — Ambulatory Visit (INDEPENDENT_AMBULATORY_CARE_PROVIDER_SITE_OTHER)
Admission: EM | Admit: 2019-01-23 | Discharge: 2019-01-23 | Disposition: A | Discharge status: Home / Self Care | Payer: HMO | Source: Home / Self Care

## 2019-01-23 DIAGNOSIS — N186 End stage renal disease: Secondary | ICD-10-CM | POA: Diagnosis not present

## 2019-01-23 DIAGNOSIS — E860 Dehydration: Secondary | ICD-10-CM | POA: Diagnosis present

## 2019-01-23 DIAGNOSIS — M109 Gout, unspecified: Secondary | ICD-10-CM | POA: Diagnosis present

## 2019-01-23 DIAGNOSIS — N184 Chronic kidney disease, stage 4 (severe): Secondary | ICD-10-CM | POA: Diagnosis not present

## 2019-01-23 DIAGNOSIS — Z20828 Contact with and (suspected) exposure to other viral communicable diseases: Secondary | ICD-10-CM | POA: Diagnosis present

## 2019-01-23 DIAGNOSIS — I2510 Atherosclerotic heart disease of native coronary artery without angina pectoris: Secondary | ICD-10-CM | POA: Diagnosis present

## 2019-01-23 DIAGNOSIS — D72829 Elevated white blood cell count, unspecified: Secondary | ICD-10-CM | POA: Diagnosis not present

## 2019-01-23 DIAGNOSIS — M79672 Pain in left foot: Secondary | ICD-10-CM | POA: Diagnosis not present

## 2019-01-23 DIAGNOSIS — L409 Psoriasis, unspecified: Secondary | ICD-10-CM | POA: Diagnosis present

## 2019-01-23 DIAGNOSIS — Z7901 Long term (current) use of anticoagulants: Secondary | ICD-10-CM | POA: Diagnosis not present

## 2019-01-23 DIAGNOSIS — R0602 Shortness of breath: Secondary | ICD-10-CM | POA: Diagnosis not present

## 2019-01-23 DIAGNOSIS — I5022 Chronic systolic (congestive) heart failure: Secondary | ICD-10-CM | POA: Diagnosis not present

## 2019-01-23 DIAGNOSIS — R112 Nausea with vomiting, unspecified: Secondary | ICD-10-CM

## 2019-01-23 DIAGNOSIS — E118 Type 2 diabetes mellitus with unspecified complications: Secondary | ICD-10-CM | POA: Diagnosis not present

## 2019-01-23 DIAGNOSIS — I4891 Unspecified atrial fibrillation: Secondary | ICD-10-CM | POA: Insufficient documentation

## 2019-01-23 DIAGNOSIS — E861 Hypovolemia: Secondary | ICD-10-CM | POA: Diagnosis present

## 2019-01-23 DIAGNOSIS — R739 Hyperglycemia, unspecified: Secondary | ICD-10-CM | POA: Diagnosis not present

## 2019-01-23 DIAGNOSIS — E876 Hypokalemia: Secondary | ICD-10-CM | POA: Diagnosis present

## 2019-01-23 DIAGNOSIS — Z452 Encounter for adjustment and management of vascular access device: Secondary | ICD-10-CM | POA: Diagnosis not present

## 2019-01-23 DIAGNOSIS — Z9049 Acquired absence of other specified parts of digestive tract: Secondary | ICD-10-CM | POA: Insufficient documentation

## 2019-01-23 DIAGNOSIS — Z794 Long term (current) use of insulin: Secondary | ICD-10-CM | POA: Diagnosis not present

## 2019-01-23 DIAGNOSIS — Z888 Allergy status to other drugs, medicaments and biological substances status: Secondary | ICD-10-CM

## 2019-01-23 DIAGNOSIS — Z87442 Personal history of urinary calculi: Secondary | ICD-10-CM

## 2019-01-23 DIAGNOSIS — E785 Hyperlipidemia, unspecified: Secondary | ICD-10-CM | POA: Diagnosis present

## 2019-01-23 DIAGNOSIS — D631 Anemia in chronic kidney disease: Secondary | ICD-10-CM | POA: Diagnosis present

## 2019-01-23 DIAGNOSIS — Z880 Allergy status to penicillin: Secondary | ICD-10-CM

## 2019-01-23 DIAGNOSIS — R079 Chest pain, unspecified: Secondary | ICD-10-CM | POA: Diagnosis not present

## 2019-01-23 DIAGNOSIS — G4730 Sleep apnea, unspecified: Secondary | ICD-10-CM | POA: Diagnosis present

## 2019-01-23 DIAGNOSIS — Z91012 Allergy to eggs: Secondary | ICD-10-CM

## 2019-01-23 DIAGNOSIS — Z90710 Acquired absence of both cervix and uterus: Secondary | ICD-10-CM | POA: Insufficient documentation

## 2019-01-23 DIAGNOSIS — I129 Hypertensive chronic kidney disease with stage 1 through stage 4 chronic kidney disease, or unspecified chronic kidney disease: Secondary | ICD-10-CM | POA: Diagnosis not present

## 2019-01-23 DIAGNOSIS — R5383 Other fatigue: Secondary | ICD-10-CM | POA: Insufficient documentation

## 2019-01-23 DIAGNOSIS — I4819 Other persistent atrial fibrillation: Secondary | ICD-10-CM | POA: Diagnosis present

## 2019-01-23 DIAGNOSIS — E1122 Type 2 diabetes mellitus with diabetic chronic kidney disease: Secondary | ICD-10-CM | POA: Diagnosis not present

## 2019-01-23 DIAGNOSIS — Z79899 Other long term (current) drug therapy: Secondary | ICD-10-CM | POA: Insufficient documentation

## 2019-01-23 DIAGNOSIS — Z809 Family history of malignant neoplasm, unspecified: Secondary | ICD-10-CM | POA: Insufficient documentation

## 2019-01-23 DIAGNOSIS — I132 Hypertensive heart and chronic kidney disease with heart failure and with stage 5 chronic kidney disease, or end stage renal disease: Secondary | ICD-10-CM | POA: Diagnosis present

## 2019-01-23 DIAGNOSIS — E119 Type 2 diabetes mellitus without complications: Secondary | ICD-10-CM

## 2019-01-23 DIAGNOSIS — I5042 Chronic combined systolic (congestive) and diastolic (congestive) heart failure: Secondary | ICD-10-CM | POA: Diagnosis not present

## 2019-01-23 DIAGNOSIS — I509 Heart failure, unspecified: Secondary | ICD-10-CM | POA: Insufficient documentation

## 2019-01-23 DIAGNOSIS — R197 Diarrhea, unspecified: Secondary | ICD-10-CM | POA: Diagnosis present

## 2019-01-23 DIAGNOSIS — E869 Volume depletion, unspecified: Secondary | ICD-10-CM | POA: Diagnosis present

## 2019-01-23 DIAGNOSIS — N2581 Secondary hyperparathyroidism of renal origin: Secondary | ICD-10-CM | POA: Diagnosis not present

## 2019-01-23 DIAGNOSIS — I482 Chronic atrial fibrillation, unspecified: Secondary | ICD-10-CM | POA: Diagnosis present

## 2019-01-23 DIAGNOSIS — Z87891 Personal history of nicotine dependence: Secondary | ICD-10-CM

## 2019-01-23 DIAGNOSIS — E1165 Type 2 diabetes mellitus with hyperglycemia: Secondary | ICD-10-CM | POA: Diagnosis present

## 2019-01-23 DIAGNOSIS — I272 Pulmonary hypertension, unspecified: Secondary | ICD-10-CM | POA: Diagnosis present

## 2019-01-23 DIAGNOSIS — N179 Acute kidney failure, unspecified: Secondary | ICD-10-CM | POA: Diagnosis not present

## 2019-01-23 DIAGNOSIS — M7989 Other specified soft tissue disorders: Secondary | ICD-10-CM | POA: Diagnosis not present

## 2019-01-23 DIAGNOSIS — I5040 Unspecified combined systolic (congestive) and diastolic (congestive) heart failure: Secondary | ICD-10-CM | POA: Diagnosis not present

## 2019-01-23 DIAGNOSIS — J449 Chronic obstructive pulmonary disease, unspecified: Secondary | ICD-10-CM | POA: Diagnosis not present

## 2019-01-23 DIAGNOSIS — I10 Essential (primary) hypertension: Secondary | ICD-10-CM | POA: Diagnosis not present

## 2019-01-23 DIAGNOSIS — Z992 Dependence on renal dialysis: Secondary | ICD-10-CM

## 2019-01-23 DIAGNOSIS — I130 Hypertensive heart and chronic kidney disease with heart failure and stage 1 through stage 4 chronic kidney disease, or unspecified chronic kidney disease: Secondary | ICD-10-CM | POA: Diagnosis not present

## 2019-01-23 DIAGNOSIS — M1990 Unspecified osteoarthritis, unspecified site: Secondary | ICD-10-CM | POA: Diagnosis present

## 2019-01-23 DIAGNOSIS — K219 Gastro-esophageal reflux disease without esophagitis: Secondary | ICD-10-CM | POA: Diagnosis present

## 2019-01-23 DIAGNOSIS — N185 Chronic kidney disease, stage 5: Secondary | ICD-10-CM | POA: Diagnosis not present

## 2019-01-23 DIAGNOSIS — Z823 Family history of stroke: Secondary | ICD-10-CM | POA: Insufficient documentation

## 2019-01-23 DIAGNOSIS — N189 Chronic kidney disease, unspecified: Secondary | ICD-10-CM | POA: Diagnosis present

## 2019-01-23 LAB — BASIC METABOLIC PANEL
Anion gap: 17 — ABNORMAL HIGH (ref 5–15)
Anion gap: 16 — ABNORMAL HIGH (ref 5–15)
BUN: 127 mg/dL — ABNORMAL HIGH (ref 8–23)
BUN: 130 mg/dL — ABNORMAL HIGH (ref 8–23)
CO2: 33 mmol/L — ABNORMAL HIGH (ref 22–32)
CO2: 33 mmol/L — ABNORMAL HIGH (ref 22–32)
Calcium: 8.2 mg/dL — ABNORMAL LOW (ref 8.9–10.3)
Calcium: 8 mg/dL — ABNORMAL LOW (ref 8.9–10.3)
Chloride: 87 mmol/L — ABNORMAL LOW (ref 98–111)
Chloride: 84 mmol/L — ABNORMAL LOW (ref 98–111)
Creatinine, Ser: 4.76 mg/dL — ABNORMAL HIGH (ref 0.44–1.00)
Creatinine, Ser: 4.82 mg/dL — ABNORMAL HIGH (ref 0.44–1.00)
GFR calc Af Amer: 10 mL/min — ABNORMAL LOW (ref >60)
GFR calc Af Amer: 9 mL/min — ABNORMAL LOW (ref >60)
GFR calc non Af Amer: 8 mL/min — ABNORMAL LOW (ref >60)
GFR calc non Af Amer: 8 mL/min — ABNORMAL LOW (ref >60)
Glucose, Bld: 192 mg/dL — ABNORMAL HIGH (ref 70–99)
Glucose, Bld: 486 mg/dL — ABNORMAL HIGH (ref 70–99)
Potassium: 2.9 mmol/L — ABNORMAL LOW (ref 3.5–5.1)
Potassium: 3 mmol/L — ABNORMAL LOW (ref 3.5–5.1)
Sodium: 137 mmol/L (ref 135–145)
Sodium: 133 mmol/L — ABNORMAL LOW (ref 135–145)

## 2019-01-23 LAB — POCT URINALYSIS DIP (DEVICE)
Bilirubin Urine: NEGATIVE
Glucose, UA: NEGATIVE mg/dL
Hgb urine dipstick: NEGATIVE
Ketones, ur: NEGATIVE mg/dL
Leukocytes,Ua: NEGATIVE
Nitrite: NEGATIVE
Protein, ur: 100 mg/dL — AB
Specific Gravity, Urine: 1.02 (ref 1.005–1.030)
Urobilinogen, UA: 0.2 mg/dL (ref 0.0–1.0)
pH: 7 (ref 5.0–8.0)

## 2019-01-23 LAB — GLUCOSE, CAPILLARY: Glucose-Capillary: 188 mg/dL — ABNORMAL HIGH (ref 70–99)

## 2019-01-23 LAB — CBC
HCT: 39.7 % (ref 36.0–46.0)
Hemoglobin: 12.7 g/dL (ref 12.0–15.0)
MCH: 27.6 pg (ref 26.0–34.0)
MCHC: 32 g/dL (ref 30.0–36.0)
MCV: 86.3 fL (ref 80.0–100.0)
Platelets: 321 10*3/uL (ref 150–400)
RBC: 4.6 MIL/uL (ref 3.87–5.11)
RDW: 14.5 % (ref 11.5–15.5)
WBC: 14.9 10*3/uL — ABNORMAL HIGH (ref 4.0–10.5)
nRBC: 0 % (ref 0.0–0.2)

## 2019-01-23 LAB — TROPONIN I (HIGH SENSITIVITY): Troponin I (High Sensitivity): 38 ng/L — ABNORMAL HIGH (ref <18)

## 2019-01-23 LAB — CBG MONITORING, ED: Glucose-Capillary: 188 mg/dL — ABNORMAL HIGH (ref 70–99)

## 2019-01-23 MED ORDER — ONDANSETRON 8 MG PO TBDP: 8.0000 mg | ORAL_TABLET | Freq: Three times a day (TID) | ORAL | 0 refills | Status: DC | PRN

## 2019-01-23 MED ORDER — SODIUM CHLORIDE 0.9% FLUSH: 3.0000 mL | Freq: Once | INTRAVENOUS | Status: DC

## 2019-01-23 NOTE — ED Provider Notes (Addendum)
[place]   MRN: [id number] DOB: [birth year]  Subjective:   Stephanie Woodward is a 78 y.o. female presenting for 4-day history of persistent and worsening malaise, fatigue.  Symptoms started with nausea and vomiting, started having diarrhea today.  She has felt fatigue as a result.  She contacted her PCP, nurse practitioner that advised she come here to our clinic to get a basic metabolic panel, Covid testing.  Patient's mother also has a nephrologist, is going to be seen by them on February 01, 2019.  Has CKD 4, just had a fistula done.  No current facility-administered medications for this encounter.   Current Outpatient Medications:  .  albuterol (PROVENTIL HFA;VENTOLIN HFA) 108 (90 BASE) MCG/ACT inhaler, Inhale 2 puffs into the lungs every 4 (four) hours as needed for shortness of breath. , Disp: , Rfl:  .  atorvastatin (LIPITOR) 80 MG tablet, Take 80 mg by mouth every evening. , Disp: , Rfl:  .  calcitRIOL (ROCALTROL) 0.25 MCG capsule, Take 0.25 mcg by mouth daily., Disp: , Rfl:  .  fluticasone (CUTIVATE) 0.005 % ointment, Apply 1 application topically 2 (two) times daily as needed (skin irritation/rash). , Disp: , Rfl: 2 .  guaiFENesin (MUCINEX) 600 MG 12 hr tablet, Take 600 mg by mouth 2 (two) times daily as needed for cough. , Disp: , Rfl:  .  hydrALAZINE (APRESOLINE) 100 MG tablet, Take 1 tablet (100 mg total) by mouth 3 (three) times daily., Disp: 270 tablet, Rfl: 3 .  insulin aspart (NOVOLOG) 100 UNIT/ML injection, Inject 10 Units into the skin 2 (two) times daily before lunch and supper. , Disp: , Rfl:  .  insulin degludec (TRESIBA FLEXTOUCH) 100 UNIT/ML SOPN FlexTouch Pen, Inject 40 Units into the skin every morning. , Disp: , Rfl:  .  isosorbide mononitrate (IMDUR) 30 MG 24 hr tablet, Take 1 tablet by mouth once daily, Disp: 30 tablet, Rfl: 0 .  latanoprost (XALATAN) 0.005 % ophthalmic solution, Place 1 drop into both eyes at bedtime., Disp: , Rfl:  .  metoprolol  succinate (TOPROL-XL) 25 MG 24 hr tablet, Take 2 tablets (50 mg total) by mouth every morning AND 1 tablet (25 mg total) every evening., Disp: 90 tablet, Rfl: 6 .  NUCYNTA ER 50 MG 12 hr tablet, Take 50 mg by mouth every 12 (twelve) hours., Disp: , Rfl: 0 .  ondansetron (ZOFRAN) 4 MG tablet, Take 4 mg by mouth 2 (two) times daily as needed for nausea. , Disp: , Rfl:  .  pantoprazole (PROTONIX) 40 MG tablet, Take 40 mg by mouth daily. , Disp: , Rfl:  .  repaglinide (PRANDIN) 1 MG tablet, Take 2 mg by mouth 3 (three) times daily before meals., Disp: , Rfl:  .  Rivaroxaban (XARELTO) 15 MG TABS tablet, Take 1 tablet (15 mg total) by mouth daily with supper., Disp: 90 tablet, Rfl: 3 .  torsemide (DEMADEX) 20 MG tablet, Take 4 tablets (80 mg total) by mouth 3 (three) times daily. Take an additional 40mg  (2 tabs) as needed, Disp: 240 tablet, Rfl: 3 .  traMADol (ULTRAM) 50 MG tablet, Take 50 mg by mouth every 6 (six) hours as needed for severe pain (for pain.). , Disp: , Rfl:  .  triamcinolone cream (KENALOG) 0.1 %, Apply 1 application topically 2 (two) times daily as needed (skin rash/irritation.). , Disp: , Rfl:    Allergies  Allergen Reactions  . Eggs Or Egg-Derived Products Nausea And Vomiting  . Lisinopril Nausea And Vomiting  .  Penicillins Nausea And Vomiting    Has patient had a PCN reaction causing immediate rash, facial/tongue/throat swelling, SOB or lightheadedness with hypotension: No Has patient had a PCN reaction causing severe rash involving mucus membranes or skin necrosis: No Has patient had a PCN reaction that required hospitalization: No Has patient had a PCN reaction occurring within the last 10 years: No If all of the above answers are "NO", then may proceed with Cephalosporin use.     Past Medical History:  Diagnosis Date  . Arthritis   . Asthma   . Atrial fibrillation ([place])   . CHF (congestive heart failure) ([place])   . CKD (chronic kidney disease), stage III   . COPD  (chronic obstructive pulmonary disease) ([place])   . Diabetes mellitus    INSULIN DEPENDENT  . Gout   . Heart murmur    no issues per pt  . History of kidney stones   . Hyperlipemia   . Hypertension   . Psoriasis   . Renal disorder    congenital  . Single kidney   . Sleep apnea    doesn't use the Cpap     Past Surgical History:  Procedure Laterality Date  . ABDOMINAL HYSTERECTOMY    . AV FISTULA PLACEMENT Left 11/17/2018   Procedure: BRACHIO-CEPHALIC ARTERIOVENOUS (AV) FISTULA CREATION IN LEFT ARM;  Surgeon: Marty Heck, MD;  Location: [place];  Service: Vascular;  Laterality: Left;  . CHOLECYSTECTOMY    . LEFT AND RIGHT HEART CATHETERIZATION WITH CORONARY ANGIOGRAM N/A 11/29/2013   Procedure: LEFT AND RIGHT HEART CATHETERIZATION WITH CORONARY ANGIOGRAM;  Surgeon: Jacolyn Reedy, MD;  Location: [facility] CATH LAB;  Service: Cardiovascular;  Laterality: N/A;  . RIGHT HEART CATH N/A 11/23/2017   Procedure: RIGHT HEART CATH;  Surgeon: Larey Dresser, MD;  Location: [place] CV LAB;  Service: Cardiovascular;  Laterality: N/A;    Family History  Problem Relation Age of Onset  . Lupus Daughter   . Cancer Mother 78  . CVA Father 64  . Cancer Brother 63    Social History   Tobacco Use  . Smoking status: Former Smoker    Packs/day: 1.00    Years: 20.00    Pack years: 20.00    Types: Cigarettes    Quit date: 02/22/2009    Years since quitting: 9.9  . Smokeless tobacco: Never Used  Substance Use Topics  . Alcohol use: Yes    Alcohol/week: 0.0 standard drinks    Comment: occasional beer  . Drug use: Not Currently    Comment: marijuana in the past    Review of Systems  Constitutional: Negative for fever and malaise/fatigue.  HENT: Negative for congestion, ear pain, sinus pain and sore throat.   Eyes: Negative for discharge and redness.  Respiratory: Negative for cough, hemoptysis, shortness of breath and wheezing.   Cardiovascular: Negative for chest pain.   Gastrointestinal: Positive for diarrhea, nausea and vomiting. Negative for abdominal pain and blood in stool.  Genitourinary: Negative for dysuria, flank pain and hematuria.  Musculoskeletal: Negative for myalgias.  Skin: Negative for rash.  Neurological: Positive for weakness (Chronic). Negative for dizziness and headaches.  Psychiatric/Behavioral: Negative for depression and substance abuse.     Objective:   Vitals: BP (!) 142/73 (BP Location: Right Arm)   Pulse 81   Temp 98.9 F (37.2 C) (Oral)   Resp 18   Wt 210 lb (95.3 kg)   SpO2 100%   BMI 37.20 kg/m   Physical  Exam Constitutional:      General: She is not in acute distress.    Appearance: Normal appearance. She is well-developed. She is obese. She is ill-appearing (Chronic). She is not toxic-appearing or diaphoretic.  HENT:     Head: Normocephalic and atraumatic.     Right Ear: Tympanic membrane and ear canal normal. No drainage or tenderness. No middle ear effusion. Tympanic membrane is not erythematous.     Left Ear: Tympanic membrane and ear canal normal. No drainage or tenderness.  No middle ear effusion. Tympanic membrane is not erythematous.     Nose: Nose normal. No congestion or rhinorrhea.     Mouth/Throat:     Mouth: Mucous membranes are moist. No oral lesions.     Pharynx: Oropharynx is clear. No pharyngeal swelling, oropharyngeal exudate, posterior oropharyngeal erythema or uvula swelling.     Tonsils: No tonsillar exudate or tonsillar abscesses.  Eyes:     Extraocular Movements: Extraocular movements intact.     Right eye: Normal extraocular motion.     Left eye: Normal extraocular motion.     Conjunctiva/sclera: Conjunctivae normal.     Pupils: Pupils are equal, round, and reactive to light.  Neck:     Musculoskeletal: Normal range of motion and neck supple.  Cardiovascular:     Rate and Rhythm: Normal rate and regular rhythm.     Pulses: Normal pulses.     Heart sounds: Normal heart sounds. No  murmur. No friction rub. No gallop.   Pulmonary:     Effort: Pulmonary effort is normal. No respiratory distress.     Breath sounds: Normal breath sounds. No stridor. No wheezing, rhonchi or rales.  Lymphadenopathy:     Cervical: No cervical adenopathy.  Skin:    General: Skin is warm and dry.     Findings: No rash.  Neurological:     General: No focal deficit present.     Mental Status: She is alert and oriented to person, place, and time.     Gait: Gait abnormal (Bound to a rolling walker).  Psychiatric:        Mood and Affect: Mood normal.        Behavior: Behavior normal.        Thought Content: Thought content normal.        Judgment: Judgment normal.     Results for orders placed or performed during the hospital encounter of 01/23/19 (from the past 24 hour(s))  POC CBG monitoring     Status: Abnormal   Collection Time: 01/23/19  5:12 PM  Result Value Ref Range   Glucose-Capillary 188 (H) 70 - 99 mg/dL  Glucose, capillary     Status: Abnormal   Collection Time: 01/23/19  5:12 PM  Result Value Ref Range   Glucose-Capillary 188 (H) 70 - 99 mg/dL  POCT urinalysis dip (device)     Status: Abnormal   Collection Time: 01/23/19  5:18 PM  Result Value Ref Range   Glucose, UA NEGATIVE NEGATIVE mg/dL   Bilirubin Urine NEGATIVE NEGATIVE   Ketones, ur NEGATIVE NEGATIVE mg/dL   Specific Gravity, Urine 1.020 1.005 - 1.030   Hgb urine dipstick NEGATIVE NEGATIVE   pH 7.0 5.0 - 8.0   Protein, ur 100 (A) NEGATIVE mg/dL   Urobilinogen, UA 0.2 0.0 - 1.0 mg/dL   Nitrite NEGATIVE NEGATIVE   Leukocytes,Ua NEGATIVE NEGATIVE    Assessment and Plan :   1. Nausea vomiting and diarrhea   2. Well controlled diabetes mellitus ([place])  3. CKD (chronic kidney disease), stage IV (HCC)   4. Other fatigue   5. Hyperglycemia     Discussed with patient the need to follow-up as soon as possible with her PCP.  Counseled that she may need to revisit her insulin dosing with her PCP given her  fluctuations in her blood sugar.  Will send prescription for Zofran to her pharmacy electronically for symptomatic relief.  COVID-19 testing pending.  Maintain follow-up with nephrologist.  Basic metabolic panel is pending, will redirect to the ED ER for critical values. Counseled patient on potential for adverse effects with medications prescribed/recommended today, ER and return-to-clinic precautions discussed, patient verbalized understanding.    Jaynee Eagles, PA-C 01/23/19 1741   Patient has significant electrolyte disturbances and a twofold increase in her creatinine level compared to 2 months ago.  Patient has had significant nausea and vomiting, diarrhea which may have led to a dehydration and possible AKI.  Patient already has CKD stage IV and needs further evaluation in emergency room setting.  Discussed these results with her daughter, [name] at [phone].  She verbalized understanding, contracted for safety and will help Mrs. Lundynn get to the ER.   Jaynee Eagles, PA-C 01/23/19 2019

## 2019-01-23 NOTE — ED Triage Notes (Signed)
Pt states she has had diarrhea and nausea and vomiting 4 days off and on. Pt states she had diarrhea around 8 am this morning. Pt states she has not had it since than. Pt states she feels so fatigue.

## 2019-01-23 NOTE — ED Triage Notes (Signed)
Pt has been having n/v/d and CP went to UC and had labs done, told to come here due to abnormal labs, AKI,

## 2019-01-24 DIAGNOSIS — I5042 Chronic combined systolic (congestive) and diastolic (congestive) heart failure: Secondary | ICD-10-CM

## 2019-01-24 DIAGNOSIS — J449 Chronic obstructive pulmonary disease, unspecified: Secondary | ICD-10-CM | POA: Diagnosis present

## 2019-01-24 DIAGNOSIS — N189 Chronic kidney disease, unspecified: Secondary | ICD-10-CM | POA: Diagnosis present

## 2019-01-24 DIAGNOSIS — N184 Chronic kidney disease, stage 4 (severe): Secondary | ICD-10-CM

## 2019-01-24 DIAGNOSIS — N185 Chronic kidney disease, stage 5: Secondary | ICD-10-CM | POA: Diagnosis not present

## 2019-01-24 DIAGNOSIS — M109 Gout, unspecified: Secondary | ICD-10-CM | POA: Diagnosis present

## 2019-01-24 DIAGNOSIS — I482 Chronic atrial fibrillation, unspecified: Secondary | ICD-10-CM

## 2019-01-24 DIAGNOSIS — E861 Hypovolemia: Secondary | ICD-10-CM | POA: Diagnosis present

## 2019-01-24 DIAGNOSIS — I10 Essential (primary) hypertension: Secondary | ICD-10-CM

## 2019-01-24 DIAGNOSIS — N2581 Secondary hyperparathyroidism of renal origin: Secondary | ICD-10-CM | POA: Diagnosis present

## 2019-01-24 DIAGNOSIS — I132 Hypertensive heart and chronic kidney disease with heart failure and with stage 5 chronic kidney disease, or end stage renal disease: Secondary | ICD-10-CM | POA: Diagnosis present

## 2019-01-24 DIAGNOSIS — Z7901 Long term (current) use of anticoagulants: Secondary | ICD-10-CM

## 2019-01-24 DIAGNOSIS — R112 Nausea with vomiting, unspecified: Secondary | ICD-10-CM | POA: Diagnosis present

## 2019-01-24 DIAGNOSIS — G4730 Sleep apnea, unspecified: Secondary | ICD-10-CM | POA: Diagnosis present

## 2019-01-24 DIAGNOSIS — E118 Type 2 diabetes mellitus with unspecified complications: Secondary | ICD-10-CM

## 2019-01-24 DIAGNOSIS — R197 Diarrhea, unspecified: Secondary | ICD-10-CM | POA: Diagnosis present

## 2019-01-24 DIAGNOSIS — I4819 Other persistent atrial fibrillation: Secondary | ICD-10-CM | POA: Diagnosis present

## 2019-01-24 DIAGNOSIS — L409 Psoriasis, unspecified: Secondary | ICD-10-CM | POA: Diagnosis present

## 2019-01-24 DIAGNOSIS — D631 Anemia in chronic kidney disease: Secondary | ICD-10-CM | POA: Diagnosis present

## 2019-01-24 DIAGNOSIS — E876 Hypokalemia: Secondary | ICD-10-CM

## 2019-01-24 DIAGNOSIS — E860 Dehydration: Secondary | ICD-10-CM | POA: Diagnosis present

## 2019-01-24 DIAGNOSIS — N179 Acute kidney failure, unspecified: Secondary | ICD-10-CM | POA: Diagnosis present

## 2019-01-24 DIAGNOSIS — K219 Gastro-esophageal reflux disease without esophagitis: Secondary | ICD-10-CM | POA: Diagnosis present

## 2019-01-24 DIAGNOSIS — Z20828 Contact with and (suspected) exposure to other viral communicable diseases: Secondary | ICD-10-CM | POA: Diagnosis present

## 2019-01-24 DIAGNOSIS — I272 Pulmonary hypertension, unspecified: Secondary | ICD-10-CM | POA: Diagnosis present

## 2019-01-24 DIAGNOSIS — D72829 Elevated white blood cell count, unspecified: Secondary | ICD-10-CM

## 2019-01-24 DIAGNOSIS — E1122 Type 2 diabetes mellitus with diabetic chronic kidney disease: Secondary | ICD-10-CM | POA: Diagnosis present

## 2019-01-24 DIAGNOSIS — N186 End stage renal disease: Secondary | ICD-10-CM | POA: Diagnosis present

## 2019-01-24 DIAGNOSIS — I2510 Atherosclerotic heart disease of native coronary artery without angina pectoris: Secondary | ICD-10-CM | POA: Diagnosis present

## 2019-01-24 DIAGNOSIS — E869 Volume depletion, unspecified: Secondary | ICD-10-CM | POA: Diagnosis present

## 2019-01-24 DIAGNOSIS — E1165 Type 2 diabetes mellitus with hyperglycemia: Secondary | ICD-10-CM | POA: Diagnosis present

## 2019-01-24 DIAGNOSIS — E785 Hyperlipidemia, unspecified: Secondary | ICD-10-CM | POA: Diagnosis present

## 2019-01-24 LAB — URINALYSIS, ROUTINE W REFLEX MICROSCOPIC
Bilirubin Urine: NEGATIVE
Glucose, UA: NEGATIVE mg/dL
Hgb urine dipstick: NEGATIVE
Ketones, ur: NEGATIVE mg/dL
Leukocytes,Ua: NEGATIVE
Nitrite: NEGATIVE
Protein, ur: 100 mg/dL — AB
Specific Gravity, Urine: 1.011 (ref 1.005–1.030)
pH: 6 (ref 5.0–8.0)

## 2019-01-24 LAB — CBC WITH DIFFERENTIAL/PLATELET
Abs Immature Granulocytes: 0.06 10*3/uL (ref 0.00–0.07)
Basophils Absolute: 0.1 10*3/uL (ref 0.0–0.1)
Basophils Relative: 0 %
Eosinophils Absolute: 0.1 10*3/uL (ref 0.0–0.5)
Eosinophils Relative: 0 %
HCT: 43.3 % (ref 36.0–46.0)
Hemoglobin: 13.7 g/dL (ref 12.0–15.0)
Immature Granulocytes: 0 %
Lymphocytes Relative: 12 %
Lymphs Abs: 1.7 10*3/uL (ref 0.7–4.0)
MCH: 27.1 pg (ref 26.0–34.0)
MCHC: 31.6 g/dL (ref 30.0–36.0)
MCV: 85.7 fL (ref 80.0–100.0)
Monocytes Absolute: 1 10*3/uL (ref 0.1–1.0)
Monocytes Relative: 7 %
Neutro Abs: 11.3 10*3/uL — ABNORMAL HIGH (ref 1.7–7.7)
Neutrophils Relative %: 81 %
Platelets: 351 10*3/uL (ref 150–400)
RBC: 5.05 MIL/uL (ref 3.87–5.11)
RDW: 14.5 % (ref 11.5–15.5)
WBC: 14.2 10*3/uL — ABNORMAL HIGH (ref 4.0–10.5)
nRBC: 0 % (ref 0.0–0.2)

## 2019-01-24 LAB — HEMOGLOBIN A1C
Hgb A1c MFr Bld: 8.9 % — ABNORMAL HIGH (ref 4.8–5.6)
Mean Plasma Glucose: 208.73 mg/dL

## 2019-01-24 LAB — COMPREHENSIVE METABOLIC PANEL
ALT: 11 U/L (ref 0–44)
AST: 26 U/L (ref 15–41)
Albumin: 3.1 g/dL — ABNORMAL LOW (ref 3.5–5.0)
Alkaline Phosphatase: 101 U/L (ref 38–126)
Anion gap: 17 — ABNORMAL HIGH (ref 5–15)
BUN: 129 mg/dL — ABNORMAL HIGH (ref 8–23)
CO2: 33 mmol/L — ABNORMAL HIGH (ref 22–32)
Calcium: 8.4 mg/dL — ABNORMAL LOW (ref 8.9–10.3)
Chloride: 86 mmol/L — ABNORMAL LOW (ref 98–111)
Creatinine, Ser: 4.69 mg/dL — ABNORMAL HIGH (ref 0.44–1.00)
GFR calc Af Amer: 10 mL/min — ABNORMAL LOW (ref >60)
GFR calc non Af Amer: 8 mL/min — ABNORMAL LOW (ref >60)
Glucose, Bld: 174 mg/dL — ABNORMAL HIGH (ref 70–99)
Potassium: 4.2 mmol/L (ref 3.5–5.1)
Sodium: 136 mmol/L (ref 135–145)
Total Bilirubin: 1.1 mg/dL (ref 0.3–1.2)
Total Protein: 7.2 g/dL (ref 6.5–8.1)

## 2019-01-24 LAB — HEPARIN LEVEL (UNFRACTIONATED): Heparin Unfractionated: 0.39 [IU]/mL (ref 0.30–0.70)

## 2019-01-24 LAB — CBG MONITORING, ED
Glucose-Capillary: 166 mg/dL — ABNORMAL HIGH (ref 70–99)
Glucose-Capillary: 153 mg/dL — ABNORMAL HIGH (ref 70–99)
Glucose-Capillary: 201 mg/dL — ABNORMAL HIGH (ref 70–99)
Glucose-Capillary: 185 mg/dL — ABNORMAL HIGH (ref 70–99)
Glucose-Capillary: 316 mg/dL — ABNORMAL HIGH (ref 70–99)

## 2019-01-24 LAB — TROPONIN I (HIGH SENSITIVITY)
Troponin I (High Sensitivity): 48 ng/L — ABNORMAL HIGH (ref <18)
Troponin I (High Sensitivity): 51 ng/L — ABNORMAL HIGH (ref <18)
Troponin I (High Sensitivity): 59 ng/L — ABNORMAL HIGH (ref <18)

## 2019-01-24 LAB — CREATININE, URINE, RANDOM: Creatinine, Urine: 106.31 mg/dL

## 2019-01-24 LAB — GLUCOSE, CAPILLARY: Glucose-Capillary: 342 mg/dL — ABNORMAL HIGH (ref 70–99)

## 2019-01-24 LAB — APTT: aPTT: 58 seconds — ABNORMAL HIGH (ref 24–36)

## 2019-01-24 LAB — MAGNESIUM: Magnesium: 1.9 mg/dL (ref 1.7–2.4)

## 2019-01-24 LAB — SARS CORONAVIRUS 2 (TAT 6-24 HRS): SARS Coronavirus 2: NEGATIVE

## 2019-01-24 LAB — BRAIN NATRIURETIC PEPTIDE: B Natriuretic Peptide: 141 pg/mL — ABNORMAL HIGH (ref 0.0–100.0)

## 2019-01-24 LAB — CK: Total CK: 100 U/L (ref 38–234)

## 2019-01-24 LAB — SODIUM, URINE, RANDOM: Sodium, Ur: 43 mmol/L

## 2019-01-24 LAB — POC SARS CORONAVIRUS 2 AG -  ED: SARS Coronavirus 2 Ag: NEGATIVE

## 2019-01-24 MED ORDER — ISOSORBIDE MONONITRATE ER 30 MG PO TB24
30.0000 mg | ORAL_TABLET | Freq: Every day | ORAL | Status: DC
  Administered 2019-01-25 – 2019-01-31 (×5): 30 mg via ORAL
  Filled 2019-01-24 (×5): qty 1

## 2019-01-24 MED ORDER — SODIUM CHLORIDE 0.9 % IV BOLUS
500.0000 mL | Freq: Once | INTRAVENOUS | Status: AC
  Administered 2019-01-24: 500 mL via INTRAVENOUS

## 2019-01-24 MED ORDER — GUAIFENESIN ER 600 MG PO TB12: 600.0000 mg | ORAL_TABLET | Freq: Two times a day (BID) | ORAL | Status: DC | PRN

## 2019-01-24 MED ORDER — INSULIN GLARGINE 100 UNIT/ML ~~LOC~~ SOLN
40.0000 [IU] | Freq: Once | SUBCUTANEOUS | Status: DC
  Filled 2019-01-24: qty 0.4

## 2019-01-24 MED ORDER — SODIUM CHLORIDE 0.9% FLUSH
3.0000 mL | Freq: Two times a day (BID) | INTRAVENOUS | Status: DC
  Administered 2019-01-31: 3 mL via INTRAVENOUS

## 2019-01-24 MED ORDER — HEPARIN (PORCINE) 25000 UT/250ML-% IV SOLN
1200.0000 [IU]/h | INTRAVENOUS | Status: AC
  Administered 2019-01-24: 1000 [IU]/h via INTRAVENOUS
  Administered 2019-01-25: 1100 [IU]/h via INTRAVENOUS
  Administered 2019-01-26 – 2019-01-27 (×2): 900 [IU]/h via INTRAVENOUS
  Administered 2019-01-28 – 2019-01-29 (×2): 1000 [IU]/h via INTRAVENOUS
  Administered 2019-01-30: 1100 [IU]/h via INTRAVENOUS
  Administered 2019-01-31: 1200 [IU]/h via INTRAVENOUS
  Filled 2019-01-24 (×7): qty 250

## 2019-01-24 MED ORDER — METOPROLOL SUCCINATE ER 25 MG PO TB24
25.0000 mg | ORAL_TABLET | Freq: Every day | ORAL | Status: DC
  Administered 2019-01-24 – 2019-01-30 (×7): 25 mg via ORAL
  Filled 2019-01-24 (×8): qty 1

## 2019-01-24 MED ORDER — ATORVASTATIN CALCIUM 80 MG PO TABS
80.0000 mg | ORAL_TABLET | Freq: Every evening | ORAL | Status: DC
  Administered 2019-01-24 – 2019-01-31 (×8): 80 mg via ORAL
  Filled 2019-01-24 (×8): qty 1

## 2019-01-24 MED ORDER — HYDRALAZINE HCL 25 MG PO TABS
100.0000 mg | ORAL_TABLET | Freq: Three times a day (TID) | ORAL | Status: DC
  Administered 2019-01-24 – 2019-01-31 (×12): 100 mg via ORAL
  Filled 2019-01-24 (×13): qty 4

## 2019-01-24 MED ORDER — HYDRALAZINE HCL 25 MG PO TABS
100.0000 mg | ORAL_TABLET | Freq: Once | ORAL | Status: AC
  Administered 2019-01-24: 100 mg via ORAL
  Filled 2019-01-24: qty 4

## 2019-01-24 MED ORDER — INSULIN ASPART 100 UNIT/ML ~~LOC~~ SOLN
0.0000 [IU] | Freq: Three times a day (TID) | SUBCUTANEOUS | Status: DC
  Administered 2019-01-24: 4 [IU] via SUBCUTANEOUS
  Administered 2019-01-25 – 2019-01-26 (×3): 1 [IU] via SUBCUTANEOUS
  Administered 2019-01-27: 2 [IU] via SUBCUTANEOUS
  Administered 2019-01-27: 4 [IU] via SUBCUTANEOUS
  Administered 2019-01-28: 1 [IU] via SUBCUTANEOUS
  Administered 2019-01-28: 4 [IU] via SUBCUTANEOUS
  Administered 2019-01-28: 3 [IU] via SUBCUTANEOUS
  Administered 2019-01-29: 2 [IU] via SUBCUTANEOUS
  Administered 2019-01-30 – 2019-01-31 (×4): 3 [IU] via SUBCUTANEOUS
  Administered 2019-01-31: 2 [IU] via SUBCUTANEOUS

## 2019-01-24 MED ORDER — LATANOPROST 0.005 % OP SOLN
1.0000 [drp] | Freq: Every day | OPHTHALMIC | Status: DC
  Administered 2019-01-25 – 2019-01-30 (×6): 1 [drp] via OPHTHALMIC
  Filled 2019-01-24 (×2): qty 2.5

## 2019-01-24 MED ORDER — METOPROLOL TARTRATE 25 MG PO TABS
50.0000 mg | ORAL_TABLET | Freq: Once | ORAL | Status: AC
  Administered 2019-01-24: 50 mg via ORAL
  Filled 2019-01-24: qty 2

## 2019-01-24 MED ORDER — METOPROLOL SUCCINATE ER 50 MG PO TB24
50.0000 mg | ORAL_TABLET | Freq: Every day | ORAL | Status: DC
  Administered 2019-01-25 – 2019-01-31 (×4): 50 mg via ORAL
  Filled 2019-01-24 (×5): qty 1

## 2019-01-24 MED ORDER — ALBUTEROL SULFATE (2.5 MG/3ML) 0.083% IN NEBU: 2.5000 mg | INHALATION_SOLUTION | Freq: Four times a day (QID) | RESPIRATORY_TRACT | Status: DC | PRN

## 2019-01-24 MED ORDER — TRAMADOL HCL 50 MG PO TABS
50.0000 mg | ORAL_TABLET | Freq: Four times a day (QID) | ORAL | Status: DC | PRN
  Administered 2019-01-24 – 2019-01-31 (×11): 50 mg via ORAL
  Filled 2019-01-24 (×11): qty 1

## 2019-01-24 MED ORDER — ACETAMINOPHEN 650 MG RE SUPP: 650.0000 mg | Freq: Four times a day (QID) | RECTAL | Status: DC | PRN

## 2019-01-24 MED ORDER — SODIUM CHLORIDE 0.9 % IV SOLN
INTRAVENOUS | Status: DC
  Administered 2019-01-24 – 2019-01-27 (×3): via INTRAVENOUS

## 2019-01-24 MED ORDER — ACETAMINOPHEN 325 MG PO TABS
650.0000 mg | ORAL_TABLET | Freq: Four times a day (QID) | ORAL | Status: DC | PRN
  Administered 2019-01-25 – 2019-01-26 (×2): 650 mg via ORAL
  Filled 2019-01-24 (×2): qty 2

## 2019-01-24 MED ORDER — INSULIN ASPART 100 UNIT/ML ~~LOC~~ SOLN
10.0000 [IU] | Freq: Every day | SUBCUTANEOUS | Status: DC
  Administered 2019-01-24: 10 [IU] via SUBCUTANEOUS

## 2019-01-24 MED ORDER — INSULIN GLARGINE 100 UNIT/ML ~~LOC~~ SOLN
25.0000 [IU] | Freq: Every day | SUBCUTANEOUS | Status: DC
  Administered 2019-01-25 – 2019-01-28 (×4): 25 [IU] via SUBCUTANEOUS
  Filled 2019-01-24 (×5): qty 0.25

## 2019-01-24 MED ORDER — INSULIN DEGLUDEC 100 UNIT/ML ~~LOC~~ SOPN: 25.0000 [IU] | PEN_INJECTOR | Freq: Every day | SUBCUTANEOUS | Status: DC

## 2019-01-24 MED ORDER — INSULIN GLARGINE 100 UNIT/ML ~~LOC~~ SOLN
25.0000 [IU] | Freq: Once | SUBCUTANEOUS | Status: AC
  Administered 2019-01-24: 25 [IU] via SUBCUTANEOUS
  Filled 2019-01-24: qty 0.25

## 2019-01-24 MED ORDER — TORSEMIDE 20 MG PO TABS
80.0000 mg | ORAL_TABLET | Freq: Once | ORAL | Status: AC
  Administered 2019-01-24: 80 mg via ORAL
  Filled 2019-01-24: qty 4

## 2019-01-24 MED ORDER — CALCITRIOL 0.25 MCG PO CAPS
0.2500 ug | ORAL_CAPSULE | Freq: Every day | ORAL | Status: DC
  Administered 2019-01-25 – 2019-01-31 (×7): 0.25 ug via ORAL
  Filled 2019-01-24 (×8): qty 1

## 2019-01-24 NOTE — H&P (Signed)
History and Physical    Stephanie Woodward EXN:[id number] DOB: May 16, 1940 DOA: 01/23/2019  Referring MD/NP/PA: Sallyanne Havers, MD PCP: Alroy Dust, Carlean Jews.Marlou Sa, MD  Patient coming from: Home  Chief Complaint: Nausea and vomiting  I have personally briefly reviewed patient's old medical records in [place]   HPI: Pernella Woodward is a 78 y.o. female with medical history significant of HTN, HLD, CHF, IDDM, solitary kidney, chronic kidney disease stage IV followed by Dr. Clover Mealy, chronic atrial fibrillation on Xarelto.  She presents with complaints of nausea and vomiting.  Patient reports being nauseated for quite some time, but started having intermittent vomiting 4 days ago.  Emesis is noted to be nonbloody and nonbilious and appears.  Associated symptoms include lower abdominal cramping and worsening memory.  Daughter notes that the patient diabetes has been wildly uncontrolled with blood sugars anywhere from 42->400.  She was finally able to get set up with the endocrinologist and has an appointment sometime next week.  She was seen by her nephrologist 3-4 weeks ago and started on metolazone.  Patient had a fistula placed at the end of September by Dr. Carlis Abbott vascular surgery, but it has fully matured.  Patient denies having any significant fevers shortness of breath, leg swelling, or diarrhea symptoms.   ED Course: Labs significant for WBC 14.9-> 14.2, sodium 133, potassium 3, CO2 33, BUN 130,  creatinine 4.82, and glucose 486, BNP 141, troponin 51->59, and anion gap 16.  Chest x-ray showed borderline heart size with vascular congestion.  Urinalysis was positive for protein, but negative for any significant signs of infection.  Patient was restarted on home regimen of blood pressure medications and given 500 mL fluid bolus.  Nephrology was formally consulted.  TRH called to admit.  Review of systems: A complete 10 point review of systems was performed and negative except for as noted above in HPI  Past Medical History:  Diagnosis Date  . Arthritis   . Asthma   . Atrial fibrillation ([place])   . CHF (congestive heart failure) ([place])   . CKD (chronic kidney disease), stage III   . COPD (chronic obstructive pulmonary disease) ([place])   . Diabetes mellitus    INSULIN DEPENDENT  . Gout   . Heart murmur    no issues per pt  . History of kidney stones   . Hyperlipemia   . Hypertension   . Psoriasis   . Renal disorder    congenital  . Single kidney   . Sleep apnea    doesn't use the Cpap    Past Surgical History:  Procedure Laterality Date  . ABDOMINAL HYSTERECTOMY    . AV FISTULA PLACEMENT Left 11/17/2018   Procedure: BRACHIO-CEPHALIC ARTERIOVENOUS (AV) FISTULA CREATION IN LEFT ARM;  Surgeon: Marty Heck, MD;  Location: [place];  Service: Vascular;  Laterality: Left;  . CHOLECYSTECTOMY    . LEFT AND RIGHT HEART CATHETERIZATION WITH CORONARY ANGIOGRAM N/A 11/29/2013   Procedure: LEFT AND RIGHT HEART CATHETERIZATION WITH CORONARY ANGIOGRAM;  Surgeon: Jacolyn Reedy, MD;  Location[facility] CATH LAB;  Service: Cardiovascular;  Laterality: N/A;  . RIGHT HEART CATH N/A 11/23/2017   Procedure: RIGHT HEART CATH;  Surgeon: Larey Dresser, MD;  Location: [place] CV LAB;  Service: Cardiovascular;  Laterality: N/A;     reports that she quit smoking about 9 years ago. Her smoking use included cigarettes. She has a 20.00 pack-year smoking history. She has never used smokeless tobacco. She reports current alcohol use. She reports previous  drug use.  Allergies  Allergen Reactions  . Eggs Or Egg-Derived Products Nausea And Vomiting  . Lisinopril Nausea And Vomiting  . Penicillins Nausea And Vomiting    Has patient had a PCN reaction causing immediate rash, facial/tongue/throat swelling, SOB or lightheadedness with hypotension: No Has patient had a PCN reaction causing severe rash involving mucus membranes or skin necrosis: No Has patient had a PCN reaction that required hospitalization:  No Has patient had a PCN reaction occurring within the last 10 years: No If all of the above answers are "NO", then may proceed with Cephalosporin use.     Family History  Problem Relation Age of Onset  . Lupus Daughter   . Cancer Mother 44  . CVA Father 42  . Cancer Brother 61    Prior to Admission medications   Medication Sig Start Date End Date Taking? Authorizing Provider  albuterol (PROVENTIL HFA;VENTOLIN HFA) 108 (90 BASE) MCG/ACT inhaler Inhale 2 puffs into the lungs every 4 (four) hours as needed for shortness of breath.    Yes [provider]  atorvastatin (LIPITOR) 80 MG tablet Take 80 mg by mouth every evening.    Yes [provider]  calcitRIOL (ROCALTROL) 0.25 MCG capsule Take 0.25 mcg by mouth daily.   Yes [provider]  fluticasone (CUTIVATE) 0.005 % ointment Apply 1 application topically 2 (two) times daily as needed (skin irritation/rash).  10/29/17  Yes [provider]  guaiFENesin (MUCINEX) 600 MG 12 hr tablet Take 600 mg by mouth 2 (two) times daily as needed for cough.    Yes [provider]  hydrALAZINE (APRESOLINE) 100 MG tablet Take 1 tablet (100 mg total) by mouth 3 (three) times daily. 10/24/18  Yes Rosita Fire, Brittainy M, PA-C  insulin aspart (NOVOLOG) 100 UNIT/ML injection Inject 10 Units into the skin daily with lunch.    Yes [provider]  insulin degludec (TRESIBA FLEXTOUCH) 100 UNIT/ML SOPN FlexTouch Pen Inject 25 Units into the skin daily.    Yes [provider]  isosorbide mononitrate (IMDUR) 30 MG 24 hr tablet Take 1 tablet by mouth once daily Patient taking differently: Take 30 mg by mouth daily.  12/25/18  Yes Larey Dresser, MD  latanoprost (XALATAN) 0.005 % ophthalmic solution Place 1 drop into both eyes at bedtime.   Yes [provider]  metolazone (ZAROXOLYN) 2.5 MG tablet Take 2.5 mg by mouth daily. 01/02/19  Yes [provider]  metoprolol succinate (TOPROL-XL) 25 MG  24 hr tablet Take 2 tablets (50 mg total) by mouth every morning AND 1 tablet (25 mg total) every evening. 09/18/18  Yes Larey Dresser, MD  [facility] 4 MG/0.1ML LIQD nasal spray kit Place 1 spray into the nose See admin instructions. Call EMS at first sign of opioid overdose. Place on spray in nose.  If no response, then place one spray ing the opposite side of the nose. 01/02/19  Yes [provider]  ondansetron (ZOFRAN) 4 MG tablet Take 4 mg by mouth 2 (two) times daily as needed for nausea.  10/26/18  Yes [provider]  pantoprazole (PROTONIX) 40 MG tablet Take 40 mg by mouth daily.  10/26/18  Yes [provider]  Rivaroxaban (XARELTO) 15 MG TABS tablet Take 1 tablet (15 mg total) by mouth daily with supper. 06/27/18  Yes Larey Dresser, MD  torsemide (DEMADEX) 20 MG tablet Take 4 tablets (80 mg total) by mouth 3 (three) times daily. Take an additional 28m (2 tabs)  as needed Patient taking differently: Take 40-80 mg by mouth See admin instructions. Take 19m by mouth three times daily, may take an additional 410m(2 tabs) as needed for fluid or swelling. 11/27/18  Yes Clegg, Amy D, NP  traMADol (ULTRAM) 50 MG tablet Take 50 mg by mouth every 6 (six) hours as needed for severe pain (for pain.).    Yes [provider]  triamcinolone cream (KENALOG) 0.1 % Apply 1 application topically 2 (two) times daily as needed (skin rash/irritation.).    Yes [provider]    Physical Exam:  Constitutional: Obese elderly female who appears to be in no acute distress at this time Vitals:   01/24/19 0609 01/24/19 0900 01/24/19 0915 01/24/19 0930  BP: 133/73 131/63 (!) 131/47 (!) 105/43  Pulse: 68 74 (!) 48 60  Resp: 18 17 (!) 21 17  Temp: 98.2 F (36.8 C)     TempSrc: Oral     SpO2: 98% 93% 94% 95%   Eyes: PERRL, lids and conjunctivae normal ENMT: Mucous membranes are moist. Posterior pharynx clear of any exudate or lesions.  Neck: normal, supple, no masses, no  thyromegaly Respiratory: clear to auscultation bilaterally, no wheezing, no crackles. Normal respiratory effort. No accessory muscle use.  Cardiovascular: Regular rate and rhythm, no murmurs / rubs / gallops. No extremity edema. 2+ pedal pulses. No carotid bruits.  Left upper extremity fistula present. Abdomen: no tenderness, no masses palpated. No hepatosplenomegaly. Bowel sounds positive.  Musculoskeletal: no clubbing / cyanosis. No joint deformity upper and lower extremities. Good ROM, no contractures. Normal muscle tone.  Skin: no rashes, lesions, ulcers. No induration Neurologic: CN 2-12 grossly intact. Sensation intact, DTR normal. Strength 5/5 in all 4.  Psychiatric: Normal judgment and insight. Alert and oriented x 3. Normal mood.     Labs on Admission: I have personally reviewed following labs and imaging studies  CBC: Recent Labs  Lab 01/23/19 2157 01/24/19 0922  WBC 14.9* 14.2*  NEUTROABS  --  11.3*  HGB 12.7 13.7  HCT 39.7 43.3  MCV 86.3 85.7  PLT 321 [id number] Basic Metabolic Panel: Recent Labs  Lab 01/23/19 1822 01/23/19 2157  NA 137 133*  K 2.9* 3.0*  CL 87* 84*  CO2 33* 33*  GLUCOSE 192* 486*  BUN 127* 130*  CREATININE 4.76* 4.82*  CALCIUM 8.2* 8.0*   GFR: Estimated Creatinine Clearance: 10.9 mL/min (A) (by C-G formula based on SCr of 4.82 mg/dL (H)). Liver Function Tests: No results for input(s): AST, ALT, ALKPHOS, BILITOT, PROT, ALBUMIN in the last 168 hours. No results for input(s): LIPASE, AMYLASE in the last 168 hours. No results for input(s): AMMONIA in the last 168 hours. Coagulation Profile: No results for input(s): INR, PROTIME in the last 168 hours. Cardiac Enzymes: No results for input(s): CKTOTAL, CKMB, CKMBINDEX, TROPONINI in the last 168 hours. BNP (last 3 results) No results for input(s): PROBNP in the last 8760 hours. HbA1C: No results for input(s): HGBA1C in the last 72 hours. CBG: Recent Labs  Lab 01/23/19 1712 01/24/19 0931   GLUCAP 188*  188* 166*   Lipid Profile: No results for input(s): CHOL, HDL, LDLCALC, TRIG, CHOLHDL, LDLDIRECT in the last 72 hours. Thyroid Function Tests: No results for input(s): TSH, T4TOTAL, FREET4, T3FREE, THYROIDAB in the last 72 hours. Anemia Panel: No results for input(s): VITAMINB12, FOLATE, FERRITIN, TIBC, IRON, RETICCTPCT in the last 72 hours. Urine analysis:    Component Value Date/Time   COLORURINE STRAW (A) 03/09/2016 066629  APPEARANCEUR CLEAR 03/09/2016 0648   LABSPEC 1.020 01/23/2019 1718   PHURINE 7.0 01/23/2019 1718   GLUCOSEU NEGATIVE 01/23/2019 1718   HGBUR NEGATIVE 01/23/2019 1718   BILIRUBINUR NEGATIVE 01/23/2019 1718   KETONESUR NEGATIVE 01/23/2019 1718   PROTEINUR 100 (A) 01/23/2019 1718   UROBILINOGEN 0.2 01/23/2019 1718   NITRITE NEGATIVE 01/23/2019 1718   LEUKOCYTESUR NEGATIVE 01/23/2019 1718   Sepsis Labs: No results found for this or any previous visit (from the past 240 hour(s)).   Radiological Exams on Admission: Dg Chest 2 View  Result Date: 01/23/2019 CLINICAL DATA:  Chest pain EXAM: CHEST - 2 VIEW COMPARISON:  09/20/2018 FINDINGS: Heart is borderline in size. Linear subsegmental atelectasis or scarring in the lingula. Mild vascular congestion. No overt edema or confluent opacities. No effusions or acute bony abnormality. IMPRESSION: Borderline heart size, mild vascular congestion. Lingular atelectasis or scarring. Electronically Signed   By: Rolm Baptise M.D.   On: 01/23/2019 22:22    EKG: Independently reviewed.  Sinus rhythm at 73 bpm with premature atrial complexes saying QT C prolonged at 510  Assessment/Plan Acute renal failure superimposed on chronic kidney disease stage IV: At baseline patient's creatinine previously had been around 2.  Patient presents with creatinine elevated up to 4.82 with a BUN 130.  Suspect prerenal cause of as patient had recently started diuretics, but there is concern for patient becoming uremic.  Patient had  been given 500 mL of normal saline IV fluids and started on furosemide.  Nephrology was consulted. -Admit to a medical telemetry bed -Check urine creatinine, urine urea, urine sodium to check FeUr -Hold further diuretics at this time -Normal saline at 50 mL/h overnight -Recheck renal function panel in a.m. -Appreciate nephrology consultative services, will follow-up for further recommendation  Nausea and vomiting: Acute.  Appears likely multifactorial given worsening kidney function with BUN elevated at 130 and diabetes being uncontrolled. -Gentle IV fluids -Holding antiemetics due to long QTC  Leukocytosis: Acute.  WBC elevated at 14.2. -Recheck WBC in a.m.  Prolonged QT interval: Acute.  Initial QT noted to be 510. -Correct electrolyte abnormalities -Avoid QT prolonging medication -Recheck EKG in a.m.  Persistent atrial fibrillation on chronic anticoagulation: Appears currently rate controlled. -Held Xarelto due to kidney function -Heparin drip per pharmacy for now -Alternative agent such as Xarelto will need to be utilized  Combined systolic and diastolic CHF: Chronic.  Last EF noted to be 45 to 50% with grade 2 diastolic dysfunction in 58/8502.  Patient does not clinically appear fluid overloaded at this time. -Strict intake and output -Daily weight  Essential hypertension: Home medication regimen includes metoprolol 50 mg twice daily, hydralazine 100 mg 3 times daily -Continue home regimen  Diabetes mellitus type 2 with hyperglycemia: Patient presents for blood sugar elevated up to 186 on admission without elevated anion gap.  Current regimen includes Tresiba 25 units daily and NovoLog 10 units with lunch.  Hemoglobin A1c last noted to be 8.8 in 02/2016. -Hypoglycemic protocols -Check hemoglobin A1c -Continue Tresiba per current regimen -CBGs before every meal with sensitive SSI -Adjust regimen as needed  Hyperlipidemia -Continue atorvastatin  Hypokalemia: Resolved.   Initial potassium noted to be 2.9-> 3-> 4.2.  Patient did not appear to receive supplementation. -Continue to monitor and replace as needed  GERD -Hold Protonix due to prolonged QT  COVID-19 screening was negative  DVT prophylaxis: Heparin Code Status: Full  Family Communication:  Discussed plan of care over the phone with the patient's daughter Disposition Plan: Likely  discharge home once patient medically stable Consults called: Nephrology Admission status: inpatient  Norval Morton MD [facility] Pager [phone]   If 7PM-7AM, please contact night-coverage [email or web address] Password [email or web address]  01/24/2019, 10:18 AM

## 2019-01-24 NOTE — Consult Note (Signed)
Reason for Consult:AKI/CKD stage 4-5 Referring Physician:  Tamala Julian, MD  Stephanie Woodward is an 78 y.o. female.  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 She was initially seen by Dr. Moshe Woodward in 2011 when creatinine bumped to 1.4. Shortly afterwards, it had come back down to 1.2. She had been on Mobic and that was likely the etiology for the rise. Her serum creatinine has continued to rise over the past 7 years from 1.26 inSeptember 2013. In May 2015 creatinine was 1.6. She then had an episode of A on CRF around that time when she was on micardis, aldactone and bactrim. Eventually all of that was stopped and in September of 2015 creatinine was 1.6. Then underwent a cardiac cath- no CAD but did have pulm HTN. Creatinine in October of 15 was 1.75. micardis was restarted. Creatinine was highly variable after that and blood pressure was good perhaps low so I had her decrease her micardis and eventually stopped it- creatinine April 2018 was 1.96 indicating a GFR of close to 30, since then, have continued to see progression in the setting of aggressive diuresis appropriate for CHF- in late January 2020 crt was in the 2.5's- GFR 20.  She has had close follow up with Dr. Moshe Woodward and underwent LUE AVF creation in November 17, 2018 by Dr. Carlis Woodward in preparation for HD.  Her Cr was 3.1 on 01/02/19.    She presented to [facility] ED with a 4 day history of N/V/D, cough, anorexia, fatigue, and malaise.  In the ED she was noted to have AKI/CKD with Cr of 4.82.  We were consulted to further evaluate and manage her AKI/CKD. The trend in Scr is seen below.   Trend in Creatinine: Creatinine, Ser  Date/Time Value Ref Range Status  01/24/2019 08:20 AM 4.69 (H) 0.44 - 1.00 mg/dL Final   01/23/2019 09:57 PM 4.82 (H) 0.44 - 1.00 mg/dL Final  01/23/2019 06:22 PM 4.76 (H) 0.44 - 1.00 mg/dL Final  11/17/2018 09:10 AM 2.60 (H) 0.44 - 1.00 mg/dL Final  10/24/2018 02:36 PM 2.32 (H) 0.44 - 1.00 mg/dL Final  09/28/2018 10:00 AM 2.42 (H) 0.44 - 1.00 mg/dL Final  09/18/2018 10:52 AM 2.53 (H) 0.44 - 1.00 mg/dL Final  07/26/2018 01:57 PM 2.80 (H) 0.44 - 1.00 mg/dL Final  07/18/2018 12:54 PM 3.06 (H) 0.44 - 1.00 mg/dL Final  05/03/2018 11:12 AM 2.86 (H) 0.44 - 1.00 mg/dL Final  03/21/2018 11:44 AM 2.72 (H) 0.44 - 1.00 mg/dL Final  03/14/2018 10:33 AM 2.56 (H) 0.44 - 1.00 mg/dL Final  12/07/2017 12:33 PM 2.40 (H) 0.44 - 1.00 mg/dL Final  11/27/2017 06:47 AM 2.52 (H) 0.44 - 1.00 mg/dL Final  11/26/2017 06:19 AM 2.40 (H) 0.44 - 1.00 mg/dL Final  11/25/2017 05:38 AM 2.23 (H) 0.44 - 1.00 mg/dL Final  11/24/2017 06:05 AM 2.19 (H) 0.44 - 1.00 mg/dL Final  11/23/2017 08:21 AM 2.29 (H) 0.44 - 1.00 mg/dL Final  11/17/2017 12:48 PM 2.45 (H) 0.44 - 1.00 mg/dL Final  04/09/2016 06:53 PM 1.79 (H) 0.44 - 1.00 mg/dL Final  04/09/2016 01:44 AM 2.12 (H) 0.44 - 1.00 mg/dL Final  04/08/2016 11:02 AM 1.95 (H) 0.44 - 1.00 mg/dL Final  04/06/2016 03:09 AM 2.04 (H) 0.44 - 1.00 mg/dL Final  04/05/2016 09:29 AM 2.07 (H) 0.44 - 1.00  mg/dL Final  04/04/2016 02:37 PM 2.00 (H) 0.44 - 1.00 mg/dL Final  03/10/2016 09:37 PM 2.71 (H) 0.44 - 1.00 mg/dL Final  03/10/2016 06:26 AM 3.94 (H) 0.44 - 1.00 mg/dL Final  03/09/2016 05:16 PM 4.39 (H) 0.44 - 1.00 mg/dL Final  03/09/2016 05:28 AM 4.42 (H) 0.44 - 1.00 mg/dL Final  02/08/2014 03:42 AM 1.95 (H) 0.50 - 1.10 mg/dL Final  02/07/2014 06:08 AM 1.92 (H) 0.50 - 1.10 mg/dL Final  02/05/2014 05:35 PM 1.65 (H) 0.50 - 1.10 mg/dL Final  12/07/2013 03:30 AM 1.75 (H) 0.50 - 1.10 mg/dL Final  12/06/2013 02:44 AM 1.64 (H) 0.50 - 1.10 mg/dL Final  12/05/2013 11:06 PM 1.70 (H) 0.50 - 1.10 mg/dL Final  12/05/2013 03:27 PM 1.63 (H) 0.50 - 1.10 mg/dL Final  10/16/2007 08:52 PM  0.99 0.40 - 1.20 mg/dL Final  05/17/2007 08:26 PM 0.81 0.40 - 1.20 mg/dL Final  03/27/2007 09:36 PM 0.85 0.40 - 1.20 mg/dL Final    PMH:   Past Medical History:  Diagnosis Date  . Arthritis   . Asthma   . Atrial fibrillation ([place])   . CHF (congestive heart failure) ([place])   . CKD (chronic kidney disease), stage III   . COPD (chronic obstructive pulmonary disease) ([place])   . Diabetes mellitus    INSULIN DEPENDENT  . Gout   . Heart murmur    no issues per pt  . History of kidney stones   . Hyperlipemia   . Hypertension   . Psoriasis   . Renal disorder    congenital  . Single kidney   . Sleep apnea    doesn't use the Cpap    PSH:   Past Surgical History:  Procedure Laterality Date  . ABDOMINAL HYSTERECTOMY    . AV FISTULA PLACEMENT Left 11/17/2018   Procedure: BRACHIO-CEPHALIC ARTERIOVENOUS (AV) FISTULA CREATION IN LEFT ARM;  Surgeon: Stephanie Heck, MD;  Location: [place];  Service: Vascular;  Laterality: Left;  . CHOLECYSTECTOMY    . LEFT AND RIGHT HEART CATHETERIZATION WITH CORONARY ANGIOGRAM N/A 11/29/2013   Procedure: LEFT AND RIGHT HEART CATHETERIZATION WITH CORONARY ANGIOGRAM;  Surgeon: Stephanie Reedy, MD;  Location: [facility] CATH LAB;  Service: Cardiovascular;  Laterality: N/A;  . RIGHT HEART CATH N/A 11/23/2017   Procedure: RIGHT HEART CATH;  Surgeon: Stephanie Dresser, MD;  Location: [place] CV LAB;  Service: Cardiovascular;  Laterality: N/A;    Allergies:  Allergies  Allergen Reactions  . Eggs Or Egg-Derived Products Nausea And Vomiting  . Lisinopril Nausea And Vomiting  . Penicillins Nausea And Vomiting    Has patient had a PCN reaction causing immediate rash, facial/tongue/throat swelling, SOB or lightheadedness with hypotension: No Has patient had a PCN reaction causing severe rash involving mucus membranes or skin necrosis: No Has patient had a PCN reaction that required hospitalization: No Has patient had a PCN reaction occurring within the last 10  years: No If all of the above answers are "NO", then may proceed with Cephalosporin use.     Medications:   Prior to Admission medications   Medication Sig Start Date End Date Taking? Authorizing Provider  albuterol (PROVENTIL HFA;VENTOLIN HFA) 108 (90 BASE) MCG/ACT inhaler Inhale 2 puffs into the lungs every 4 (four) hours as needed for shortness of breath.    Yes [provider]  atorvastatin (LIPITOR) 80 MG tablet Take 80 mg by mouth every evening.    Yes [provider]  calcitRIOL (ROCALTROL) 0.25 MCG capsule Take 0.25  mcg by mouth daily.   Yes [provider]  fluticasone (CUTIVATE) 0.005 % ointment Apply 1 application topically 2 (two) times daily as needed (skin irritation/rash).  10/29/17  Yes [provider]  guaiFENesin (MUCINEX) 600 MG 12 hr tablet Take 600 mg by mouth 2 (two) times daily as needed for cough.    Yes [provider]  hydrALAZINE (APRESOLINE) 100 MG tablet Take 1 tablet (100 mg total) by mouth 3 (three) times daily. 10/24/18  Yes Rosita Fire, Brittainy M, PA-C  insulin aspart (NOVOLOG) 100 UNIT/ML injection Inject 10 Units into the skin daily with lunch.    Yes [provider]  insulin degludec (TRESIBA FLEXTOUCH) 100 UNIT/ML SOPN FlexTouch Pen Inject 25 Units into the skin daily.    Yes [provider]  isosorbide mononitrate (IMDUR) 30 MG 24 hr tablet Take 1 tablet by mouth once daily Patient taking differently: Take 30 mg by mouth daily.  12/25/18  Yes Stephanie Dresser, MD  latanoprost (XALATAN) 0.005 % ophthalmic solution Place 1 drop into both eyes at bedtime.   Yes [provider]  metolazone (ZAROXOLYN) 2.5 MG tablet Take 2.5 mg by mouth daily. 01/02/19  Yes [provider]  metoprolol succinate (TOPROL-XL) 25 MG 24 hr tablet Take 2 tablets (50 mg total) by mouth every morning AND 1 tablet (25 mg total) every evening. 09/18/18  Yes Stephanie Dresser, MD  [facility] 4 MG/0.1ML LIQD nasal spray kit  Place 1 spray into the nose See admin instructions. Call EMS at first sign of opioid overdose. Place on spray in nose.  If no response, then place one spray ing the opposite side of the nose. 01/02/19  Yes [provider]  ondansetron (ZOFRAN) 4 MG tablet Take 4 mg by mouth 2 (two) times daily as needed for nausea.  10/26/18  Yes [provider]  pantoprazole (PROTONIX) 40 MG tablet Take 40 mg by mouth daily.  10/26/18  Yes [provider]  Rivaroxaban (XARELTO) 15 MG TABS tablet Take 1 tablet (15 mg total) by mouth daily with supper. 06/27/18  Yes Stephanie Dresser, MD  torsemide (DEMADEX) 20 MG tablet Take 4 tablets (80 mg total) by mouth 3 (three) times daily. Take an additional 30m (2 tabs) as needed Patient taking differently: Take 40-80 mg by mouth See admin instructions. Take 84mby mouth three times daily, may take an additional 4039m2 tabs) as needed for fluid or swelling. 11/27/18  Yes Clegg, Amy D, NP  traMADol (ULTRAM) 50 MG tablet Take 50 mg by mouth every 6 (six) hours as needed for severe pain (for pain.).    Yes [provider]  triamcinolone cream (KENALOG) 0.1 % Apply 1 application topically 2 (two) times daily as needed (skin rash/irritation.).    Yes [provider]    Inpatient medications: . atorvastatin  80 mg Oral QPM  . calcitRIOL  0.25 mcg Oral Daily  . hydrALAZINE  100 mg Oral TID  . insulin aspart  0-6 Units Subcutaneous TID WC  . [START ON 01/25/2019] insulin degludec  25 Units Subcutaneous Daily  . [START ON 01/25/2019] isosorbide mononitrate  30 mg Oral Daily  . latanoprost  1 drop Both Eyes QHS  . metoprolol succinate  25 mg Oral QHS  . [START ON 01/25/2019] metoprolol succinate  50 mg Oral Daily  . sodium chloride flush  3 mL Intravenous Once  . sodium chloride flush  3 mL Intravenous Q12H    Discontinued Meds:  Medications Discontinued During This Encounter  Medication Reason  . insulin glargine (LANTUS) injection 40  Units   . NUCYNTA ER 50 MG 12 hr tablet Discontinued by provider  . ondansetron (ZOFRAN-ODT) 8 MG disintegrating tablet Change in therapy  . repaglinide (PRANDIN) 1 MG tablet Discontinued by provider  . insulin aspart (novoLOG) injection 10 Units     Social History:  reports that she quit smoking about 9 years ago. Her smoking use included cigarettes. She has a 20.00 pack-year smoking history. She has never used smokeless tobacco. She reports current alcohol use. She reports previous drug use.  Family History:   Family History  Problem Relation Age of Onset  . Lupus Daughter   . Cancer Mother 80  . CVA Father 43  . Cancer Brother 45    Pertinent items are noted in HPI. Weight change:  No intake or output data in the 24 hours ending 01/24/19 1451 BP (!) 115/48   Pulse (!) 49   Temp 98.2 F (36.8 C) (Oral)   Resp 20   Ht '5\' 3"'  (1.6 m)   Wt 95.3 kg   SpO2 95%   BMI 37.22 kg/m  Vitals:   01/24/19 1330 01/24/19 1345 01/24/19 1400 01/24/19 1415  BP: 130/73 (!) 140/52 (!) 150/53 (!) 115/48  Pulse:  60 (!) 58 (!) 49  Resp:  '17 18 20  ' Temp:      TempSrc:      SpO2:  96% 95% 95%  Weight:    95.3 kg  Height:    '5\' 3"'  (1.6 m)     General appearance: fatigued and no distress Head: Normocephalic, without obvious abnormality, atraumatic Eyes: negative findings: lids and lashes normal, conjunctivae and sclerae normal and corneas clear Resp: clear to auscultation bilaterally Cardio: regular rate and rhythm and no rub GI: soft, non-tender; bowel sounds normal; no masses,  no organomegaly Extremities: extremities normal, atraumatic, no cyanosis or edema and LUE AVF +T/B Neuro:  slowed mentation, + asterixis  Labs: Basic Metabolic Panel: Recent Labs  Lab 01/23/19 1822 01/23/19 2157 01/24/19 0820  NA 137 133* 136  K 2.9* 3.0* 4.2  CL 87* 84* 86*  CO2 33* 33* 33*  GLUCOSE 192* 486* 174*  BUN 127* 130* 129*  CREATININE 4.76* 4.82* 4.69*  ALBUMIN  --   --  3.1*  CALCIUM 8.2*  8.0* 8.4*   Liver Function Tests: Recent Labs  Lab 01/24/19 0820  AST 26  ALT 11  ALKPHOS 101  BILITOT 1.1  PROT 7.2  ALBUMIN 3.1*   No results for input(s): LIPASE, AMYLASE in the last 168 hours. No results for input(s): AMMONIA in the last 168 hours. CBC: Recent Labs  Lab 01/23/19 2157 01/24/19 0922  WBC 14.9* 14.2*  NEUTROABS  --  11.3*  HGB 12.7 13.7  HCT 39.7 43.3  MCV 86.3 85.7  PLT 321 351   PT/INR: '@LABRCNTIP' (inr:5) Cardiac Enzymes: )No results for input(s): CKTOTAL, CKMB, CKMBINDEX, TROPONINI in the last 168 hours. CBG: Recent Labs  Lab 01/23/19 1712 01/24/19 0931 01/24/19 1211  GLUCAP 188*  188* 166* 153*    Iron Studies: No results for input(s): IRON, TIBC, TRANSFERRIN, FERRITIN in the last 168 hours.  Xrays/Other Studies: Dg Chest 2 View  Result Date: 01/23/2019 CLINICAL DATA:  Chest pain EXAM: CHEST - 2 VIEW COMPARISON:  09/20/2018 FINDINGS: Heart is borderline in size. Linear subsegmental atelectasis or scarring in the lingula. Mild vascular congestion. No overt edema or confluent opacities. No effusions or acute  bony abnormality. IMPRESSION: Borderline heart size, mild vascular congestion. Lingular atelectasis or scarring. Electronically Signed   By: Rolm Baptise M.D.   On: 01/23/2019 22:22     Assessment/Plan: 1.  AKI/CKD stage 4- presumably due to volume depletion in setting of N/V/D and anorexia.  She has had some nausea prior to admission but did not have vomiting.  Her daughter reports that she has not been feeling well for the past several weeks.  We discussed the chronicity and severity of her CKD and is willing to proceed with HD if her renal function does not improve with gentle hydration.  She does have uremic symptoms with n/v/anorexia and asterixis, however she was on xarelto and discussed with Dr. Tamala Woodward to stop xarelto and transition to IV heparin for now as she will likely require [facility] placement if she doesn't improve.  Her AVF is not yet  mature enough to be used. 1. Gentle IVF's overnight 2. Hold diuretics for now as well as BP meds due to low BP 3. Check CK level and urine studies. 4. No urgent indication for HD but if she doesn't improve with gentle ivf's will likely need HD in the next 24-48 hours 2. N/V/D- negative covid test.  Normal LFT's.  Unclear if this is infectious.  Recommend further workup per primary 3. HTN- low bp, hold meds for now as well as diuretics 4. Anemia of CKd- stable 5. Moderate to severe pulmonary HTN- followed by CHF clinic 6. Combined systolic and diastolic CHF- hypovolemic on exam today.  Hold diuretics and gentle hydration overnight. 7. P. Atrial fibrillation- need to stop xarelto due to advanced CKD and eventually switch to Eliquis but agree with transition to iv heparin as she may require placement of TDC soon.  8. DM- per primary svc 9. Vascular access- LUE AVF placed 11/17/18 but not quite ready for cannulation. May need TDC. 10. SHPTH- on calcitriol.  Follow labs.   Governor [place] Jalesha Plotz 01/24/2019, 2:51 PM

## 2019-01-24 NOTE — ED Provider Notes (Signed)
[facility] EMERGENCY DEPARTMENT Provider Note   CSN: [id number] Arrival date & time: 01/23/19  2113     History   Chief Complaint Chief Complaint  Patient presents with  . Abnormal Lab    HPI Stephanie Woodward is a 78 y.o. female with a history of stage IV kidney disease, with a left AV fistula placement several months ago, but not on dialysis, with a history of A. fib, hypertension, hyperlipidemia, single kidney (congenital), DM2 on insulin, CHF (EF 45-50% in Oct 2019 echo) on toresemide 80 mg TID, also on xarelto (15 mg at supper), presenting to the ED with fatigue, nausea, vomiting, coughing.  She reports approximately 4 days of these types of symptoms.  It began with fatigue and nausea.   Yesterday the patient went to urgent care center where she had blood work done.  There was concern for significantly worsening kidney function on her labwork there, and so the patient was referred to the ER.  Unfortunately she spent approximately 9 hours in the [facility] ED waiting room overnight as there were no beds available in the ED.  Upon my morning assessment of the patient, she was bedded in a room.  She reported she continues to feel fatigued and have low energy.  She does have a wet cough, and does feel some mild shortness of breath, but denies chest pain or lightheadedness.  She denies any recent leg swelling or any known weight gain.  She says she has been eating very little.  She is on a fluid restricted diet and only drinks 64 ounces of fluid daily and has been compliant with this.  I also spoke to the patient's daughter Stephanie Woodward on the phone.  She confirmed the patient's medication regimen, including Tresiba 25 units in the AM (not 40 units as listed), as well as tramadol for back pain, metoprolol 50 mg AM and 25 mg PM, and hydralazine 100 mg TID.  Patient also taking torsemide 80 mg TID (recently increased) for CHF.  Neither the patient nor her daughter were familiar with the  Imdur medication - it is unclear if she has been taking this.  The patient has a nephrologist who is not associated with [facility].  She also sees a cardiologist Dr. Marigene Ehlers.    The patient reports she does make urine and is urinating frequently.  She denies dysuria.  She reports her blood sugars have been "all over the place" lately, and states they were abnormally high (over 400) the past few days.  She thinks this may be contributing to some of her symptoms.  She missed her evening meds yesterday and xarelto dose yesterday evening.    HPI  Past Medical History:  Diagnosis Date  . Arthritis   . Asthma   . Atrial fibrillation ([place])   . CHF (congestive heart failure) ([place])   . CKD (chronic kidney disease), stage III   . COPD (chronic obstructive pulmonary disease) ([place])   . Diabetes mellitus    INSULIN DEPENDENT  . Gout   . Heart murmur    no issues per pt  . History of kidney stones   . Hyperlipemia   . Hypertension   . Psoriasis   . Renal disorder    congenital  . Single kidney   . Sleep apnea    doesn't use the Cpap    Patient Active Problem List   Diagnosis Date Noted  . Chronic kidney disease (CKD), stage IV (severe) ([place]) 10/31/2018  . Atrial  fibrillation with RVR ([place]) 04/08/2016  . Chest pain 04/04/2016  . Diabetes mellitus with complication ([place]) 16/96/7893  . Foot pain, bilateral 03/09/2016  . Renal disorder   . Bradycardia   . CHF (congestive heart failure) ([place]) 02/06/2014  . Cellulitis of right lower extremity 02/06/2014  . Essential hypertension 02/06/2014  . Hyperlipidemia 02/06/2014  . Uncontrolled type 2 diabetes mellitus with hypoglycemia ([place]) 02/06/2014  . Cellulitis 02/06/2014  . OSA (obstructive sleep apnea) 01/22/2014  . Hypoxemia 12/07/2013  . Type 2 diabetes mellitus, controlled, with renal complications ([place]) 81/02/7508  . Kidney disease, chronic, stage III (GFR 30-59 ml/min) ([place]) 12/07/2013  . Pulmonary hypertension ([place]) 12/07/2013  .  Chronic systolic congestive heart failure ([place])   . CHF exacerbation ([place]) 12/05/2013  . SOB (shortness of breath) 12/05/2013  . Gout 11/11/2009  . Obesity 11/11/2009  . Hypertensive heart disease     Past Surgical History:  Procedure Laterality Date  . ABDOMINAL HYSTERECTOMY    . AV FISTULA PLACEMENT Left 11/17/2018   Procedure: BRACHIO-CEPHALIC ARTERIOVENOUS (AV) FISTULA CREATION IN LEFT ARM;  Surgeon: Marty Heck, MD;  Location: [facility];  Service: Vascular;  Laterality: Left;  . CHOLECYSTECTOMY    . LEFT AND RIGHT HEART CATHETERIZATION WITH CORONARY ANGIOGRAM N/A 11/29/2013   Procedure: LEFT AND RIGHT HEART CATHETERIZATION WITH CORONARY ANGIOGRAM;  Surgeon: Jacolyn Reedy, MD;  Location: [facility] CATH LAB;  Service: Cardiovascular;  Laterality: N/A;  . RIGHT HEART CATH N/A 11/23/2017   Procedure: RIGHT HEART CATH;  Surgeon: Larey Dresser, MD;  Location: [facility] CV LAB;  Service: Cardiovascular;  Laterality: N/A;     OB History   No obstetric history on file.      Home Medications    Prior to Admission medications   Medication Sig Start Date End Date Taking? Authorizing Provider  albuterol (PROVENTIL HFA;VENTOLIN HFA) 108 (90 BASE) MCG/ACT inhaler Inhale 2 puffs into the lungs every 4 (four) hours as needed for shortness of breath.    Yes [provider]  atorvastatin (LIPITOR) 80 MG tablet Take 80 mg by mouth every evening.    Yes [provider]  calcitRIOL (ROCALTROL) 0.25 MCG capsule Take 0.25 mcg by mouth daily.   Yes [provider]  fluticasone (CUTIVATE) 0.005 % ointment Apply 1 application topically 2 (two) times daily as needed (skin irritation/rash).  10/29/17  Yes [provider]  guaiFENesin (MUCINEX) 600 MG 12 hr tablet Take 600 mg by mouth 2 (two) times daily as needed for cough.    Yes [provider]  hydrALAZINE (APRESOLINE) 100 MG tablet Take 1 tablet (100 mg total) by mouth 3 (three) times daily. 10/24/18  Yes  Rosita Fire, Brittainy M, PA-C  insulin aspart (NOVOLOG) 100 UNIT/ML injection Inject 10 Units into the skin daily with lunch.    Yes [provider]  insulin degludec (TRESIBA FLEXTOUCH) 100 UNIT/ML SOPN FlexTouch Pen Inject 25 Units into the skin daily.    Yes [provider]  isosorbide mononitrate (IMDUR) 30 MG 24 hr tablet Take 1 tablet by mouth once daily Patient taking differently: Take 30 mg by mouth daily.  12/25/18  Yes Larey Dresser, MD  latanoprost (XALATAN) 0.005 % ophthalmic solution Place 1 drop into both eyes at bedtime.   Yes [provider]  metolazone (ZAROXOLYN) 2.5 MG tablet Take 2.5 mg by mouth daily. 01/02/19  Yes [provider]  metoprolol succinate (TOPROL-XL) 25 MG 24 hr tablet Take 2 tablets (50 mg total) by  mouth every morning AND 1 tablet (25 mg total) every evening. 09/18/18  Yes Larey Dresser, MD  [facility] 4 MG/0.1ML LIQD nasal spray kit Place 1 spray into the nose See admin instructions. Call EMS at first sign of opioid overdose. Place on spray in nose.  If no response, then place one spray ing the opposite side of the nose. 01/02/19  Yes [provider]  ondansetron (ZOFRAN) 4 MG tablet Take 4 mg by mouth 2 (two) times daily as needed for nausea.  10/26/18  Yes [provider]  pantoprazole (PROTONIX) 40 MG tablet Take 40 mg by mouth daily.  10/26/18  Yes [provider]  Rivaroxaban (XARELTO) 15 MG TABS tablet Take 1 tablet (15 mg total) by mouth daily with supper. 06/27/18  Yes Larey Dresser, MD  torsemide (DEMADEX) 20 MG tablet Take 4 tablets (80 mg total) by mouth 3 (three) times daily. Take an additional 71m (2 tabs) as needed Patient taking differently: Take 40-80 mg by mouth See admin instructions. Take 87mby mouth three times daily, may take an additional 4050m2 tabs) as needed for fluid or swelling. 11/27/18  Yes Clegg, Amy D, NP  traMADol (ULTRAM) 50 MG tablet Take 50 mg by mouth every 6 (six) hours  as needed for severe pain (for pain.).    Yes [provider]  triamcinolone cream (KENALOG) 0.1 % Apply 1 application topically 2 (two) times daily as needed (skin rash/irritation.).    Yes [provider]    Family History Family History  Problem Relation Age of Onset  . Lupus Daughter   . Cancer Mother 85 10 CVA Father 87 64 Cancer Brother 45 24 Social History Social History   Tobacco Use  . Smoking status: Former Smoker    Packs/day: 1.00    Years: 20.00    Pack years: 20.00    Types: Cigarettes    Quit date: 02/22/2009    Years since quitting: 9.9  . Smokeless tobacco: Never Used  Substance Use Topics  . Alcohol use: Yes    Alcohol/week: 0.0 standard drinks    Comment: occasional beer  . Drug use: Not Currently    Comment: marijuana in the past     Allergies   Eggs or egg-derived products, Lisinopril, and Penicillins   Review of Systems Review of Systems  Constitutional: Positive for appetite change and fatigue. Negative for chills and fever.  Eyes: Negative for photophobia and visual disturbance.  Respiratory: Positive for cough and shortness of breath.   Cardiovascular: Positive for chest pain and leg swelling.  Gastrointestinal: Positive for nausea. Negative for abdominal pain and vomiting.  Musculoskeletal: Positive for back pain.  Skin: Negative for pallor and rash.  Neurological: Negative for syncope and light-headedness.  Psychiatric/Behavioral: Negative for agitation and confusion.  All other systems reviewed and are negative.    Physical Exam Updated Vital Signs BP (!) 105/43   Pulse 60   Temp 98.2 F (36.8 C) (Oral)   Resp 17   SpO2 95%   Physical Exam Vitals signs and nursing note reviewed.  Constitutional:      General: She is not in acute distress.    Appearance: She is well-developed. She is obese.     Comments: Coarse cough  HENT:     Head: Normocephalic and atraumatic.  Eyes:     Conjunctiva/sclera:  Conjunctivae normal.  Neck:     Musculoskeletal: Neck supple.  Cardiovascular:     Rate  and Rhythm: Normal rate and regular rhythm.     Pulses: Normal pulses.  Pulmonary:     Effort: Pulmonary effort is normal. No respiratory distress.     Breath sounds: Normal breath sounds. No wheezing.  Abdominal:     General: There is no distension.     Palpations: Abdomen is soft.  Musculoskeletal:        General: No swelling or tenderness.     Comments: Left arm fistula   Skin:    General: Skin is warm and dry.  Neurological:     Mental Status: She is alert.  Psychiatric:        Mood and Affect: Mood normal.        Behavior: Behavior normal.      ED Treatments / Results  Labs (all labs ordered are listed, but only abnormal results are displayed) Labs Reviewed  BASIC METABOLIC PANEL - Abnormal; Notable for the following components:      Result Value   Sodium 133 (*)    Potassium 3.0 (*)    Chloride 84 (*)    CO2 33 (*)    Glucose, Bld 486 (*)    BUN 130 (*)    Creatinine, Ser 4.82 (*)    Calcium 8.0 (*)    GFR calc non Af Amer 8 (*)    GFR calc Af Amer 9 (*)    Anion gap 16 (*)    All other components within normal limits  CBC - Abnormal; Notable for the following components:   WBC 14.9 (*)    All other components within normal limits  CBC WITH DIFFERENTIAL/PLATELET - Abnormal; Notable for the following components:   WBC 14.2 (*)    Neutro Abs 11.3 (*)    All other components within normal limits  CBG MONITORING, ED - Abnormal; Notable for the following components:   Glucose-Capillary 166 (*)    All other components within normal limits  TROPONIN I (HIGH SENSITIVITY) - Abnormal; Notable for the following components:   Troponin I (High Sensitivity) 38 (*)    All other components within normal limits  TROPONIN I (HIGH SENSITIVITY) - Abnormal; Notable for the following components:   Troponin I (High Sensitivity) 48 (*)    All other components within normal limits  SARS  CORONAVIRUS 2 (TAT 6-24 HRS)  MAGNESIUM  URINALYSIS, ROUTINE W REFLEX MICROSCOPIC  SODIUM, URINE, RANDOM  CREATININE, URINE, RANDOM  BRAIN NATRIURETIC PEPTIDE  COMPREHENSIVE METABOLIC PANEL  POC SARS CORONAVIRUS 2 AG -  ED  TROPONIN I (HIGH SENSITIVITY)  TROPONIN I (HIGH SENSITIVITY)    EKG EKG Interpretation  Date/Time:  Tuesday January 23 2019 22:18:11 EST Ventricular Rate:  74 PR Interval:  176 QRS Duration: 134 QT Interval:  460 QTC Calculation: 510 R Axis:   -89 Text Interpretation: Sinus rhythm with Blocked Premature atrial complexes Left axis deviation Non-specific intra-ventricular conduction block Minimal voltage criteria for LVH, may be normal variant ( Cornell product ) Possible Lateral infarct , age undetermined Abnormal ECG When compared with ECG of 10/24/2018, No significant change was found Confirmed by Delora Fuel ([id number]) on 01/23/2019 11:59:10 PM   Radiology Dg Chest 2 View  Result Date: 01/23/2019 CLINICAL DATA:  Chest pain EXAM: CHEST - 2 VIEW COMPARISON:  09/20/2018 FINDINGS: Heart is borderline in size. Linear subsegmental atelectasis or scarring in the lingula. Mild vascular congestion. No overt edema or confluent opacities. No effusions or acute bony abnormality. IMPRESSION: Borderline heart size, mild vascular congestion. Lingular atelectasis or  scarring. Electronically Signed   By: Rolm Baptise M.D.   On: 01/23/2019 22:22    Procedures Procedures (including critical care time)  Medications Ordered in ED Medications  sodium chloride flush (NS) 0.9 % injection 3 mL (has no administration in time range)  traMADol (ULTRAM) tablet 50 mg (50 mg Oral Given 01/24/19 0857)  torsemide (DEMADEX) tablet 80 mg (80 mg Oral Given 01/24/19 0843)  metoprolol tartrate (LOPRESSOR) tablet 50 mg (50 mg Oral Given 01/24/19 0843)  hydrALAZINE (APRESOLINE) tablet 100 mg (100 mg Oral Given 01/24/19 0843)  insulin glargine (LANTUS) injection 25 Units (25 Units Subcutaneous Given  01/24/19 0844)  sodium chloride 0.9 % bolus 500 mL (500 mLs Intravenous New Bag/Given 01/24/19 0850)     Initial Impression / Assessment and Plan / ED Course  I have reviewed the triage vital signs and the nursing notes.  Pertinent labs & imaging results that were available during my care of the patient were reviewed by me and considered in my medical decision making (see chart for details).  This is a 78 year old female with complicated medical history as noted above, presenting to the ED with nausea and fatigue for several days, noted to have worsening kidney failure on her labs.  Patient has a fistula placed recently, but has not yet started dialysis.  According to her daughter, there is constant concern for volume overload, therefore the patient is in a fluid restricted diet which has been compliant with.  She is also on high doses of diuretic daily which has been compliant with.  In terms of her presentation, it is unclear if there is infectious etiology to this.  Her blood sugars are high and there may be a component of DKA.  We will check a rapid Covid test, as well as UA for signs of infection.  Her chest x-ray at intake show vascular congestion but no focal consolidations.  She does have a leukocytosis of 14.9.  I will have the morning team repeat her BMP to reassess her electrolytes.  Also check a BNP as well as her other electrolytes.  I have ordered her morning insulin and medications. Her lungs are clear on exam she does not have significant pitting edema to suggest that that she is overtly volume overloaded.  Given her hyperglycemia, I believe it is reasonable to give her half liter of fluids at this time.  Clinical Course as of Jan 23 1013  Wed Jan 24, 2019  1006 Sign out given to Dr. Tamala Julian hospitalist   [MT]  1013 Spoke to Dr Michelene Heady of nephrology and placed consult   [MT]    Clinical Course User Index [MT] Laquana Villari, Carola Rhine, MD     Final Clinical Impressions(s) / ED  Diagnoses   Final diagnoses:  Acute renal failure, unspecified acute renal failure type [facility])    ED Discharge Orders    None       Wyvonnia Dusky, MD 01/24/19 1014

## 2019-01-24 NOTE — ED Notes (Signed)
Patient stated she felt very nauseated and has not eaten since yesterday, CBG checked, WNL

## 2019-01-24 NOTE — ED Notes (Signed)
Admitting mds at bedside.

## 2019-01-24 NOTE — ED Notes (Signed)
Pt given crackers and sprite.

## 2019-01-24 NOTE — ED Notes (Signed)
Ordered diet tray 

## 2019-01-24 NOTE — Progress Notes (Signed)
ANTICOAGULATION CONSULT NOTE - Initial Consult  Pharmacy Consult for heparin Indication: atrial fibrillation  Allergies  Allergen Reactions  . Eggs Or Egg-Derived Products Nausea And Vomiting  . Lisinopril Nausea And Vomiting  . Penicillins Nausea And Vomiting    Has patient had a PCN reaction causing immediate rash, facial/tongue/throat swelling, SOB or lightheadedness with hypotension: No Has patient had a PCN reaction causing severe rash involving mucus membranes or skin necrosis: No Has patient had a PCN reaction that required hospitalization: No Has patient had a PCN reaction occurring within the last 10 years: No If all of the above answers are "NO", then may proceed with Cephalosporin use.     Patient Measurements: Height: 5\' 3"  (160 cm) Weight: 210 lb 1.6 oz (95.3 kg) IBW/kg (Calculated) : 52.4 Heparin Dosing Weight: 74.4kg  Vital Signs: Temp: 98.2 F (36.8 C) (12/02 0609) Temp Source: Oral (12/02 0609) BP: 115/48 (12/02 1415) Pulse Rate: 49 (12/02 1415)  Labs: Recent Labs    01/23/19 1822 01/23/19 2157 01/24/19 0111 01/24/19 0820 01/24/19 0922  HGB  --  12.7  --   --  13.7  HCT  --  39.7  --   --  43.3  PLT  --  321  --   --  351  CREATININE 4.76* 4.82*  --  4.69*  --   TROPONINIHS  --  38* 48*  --  59*  51*    Estimated Creatinine Clearance: 11.2 mL/min (A) (by C-G formula based on SCr of 4.69 mg/dL (H)).   Medical History: Past Medical History:  Diagnosis Date  . Arthritis   . Asthma   . Atrial fibrillation ([place])   . CHF (congestive heart failure) ([place])   . CKD (chronic kidney disease), stage III   . COPD (chronic obstructive pulmonary disease) ([place])   . Diabetes mellitus    INSULIN DEPENDENT  . Gout   . Heart murmur    no issues per pt  . History of kidney stones   . Hyperlipemia   . Hypertension   . Psoriasis   . Renal disorder    congenital  . Single kidney   . Sleep apnea    doesn't use the Cpap    Medications:  Infusions:  .  sodium chloride    . heparin      Assessment: 77 yof presented to the ED with fatigue, N/V and worsening renal function. She is on xarelto PTA for history of afib. To transition to IV heparin for now due to renal function. Baseline CBC is WNL. Last dose of xarelto was on 11/30.   Goal of Therapy:  Heparin level 0.3-0.7 units/ml aPTT 66-102 seconds Monitor platelets by anticoagulation protocol: Yes   Plan:  Heparin gtt 1000 units/hr Check an 8 hr heparin level and aPTT Daily aPTT, heparin level and CBC  Zacaria Pousson, Rande Lawman 01/24/2019,2:38 PM

## 2019-01-24 NOTE — ED Notes (Signed)
Patient reports coughing up lots of phlegm, given wet cloth to clean face with. Pt denies vomiting.

## 2019-01-25 LAB — CBC
HCT: 38.8 % (ref 36.0–46.0)
Hemoglobin: 12.5 g/dL (ref 12.0–15.0)
MCH: 27.1 pg (ref 26.0–34.0)
MCHC: 32.2 g/dL (ref 30.0–36.0)
MCV: 84.2 fL (ref 80.0–100.0)
Platelets: 309 10*3/uL (ref 150–400)
RBC: 4.61 MIL/uL (ref 3.87–5.11)
RDW: 14.5 % (ref 11.5–15.5)
WBC: 14.4 10*3/uL — ABNORMAL HIGH (ref 4.0–10.5)
nRBC: 0 % (ref 0.0–0.2)

## 2019-01-25 LAB — GLUCOSE, CAPILLARY
Glucose-Capillary: 140 mg/dL — ABNORMAL HIGH (ref 70–99)
Glucose-Capillary: 151 mg/dL — ABNORMAL HIGH (ref 70–99)
Glucose-Capillary: 125 mg/dL — ABNORMAL HIGH (ref 70–99)
Glucose-Capillary: 163 mg/dL — ABNORMAL HIGH (ref 70–99)

## 2019-01-25 LAB — MAGNESIUM: Magnesium: 1.7 mg/dL (ref 1.7–2.4)

## 2019-01-25 LAB — RENAL FUNCTION PANEL
Albumin: 2.6 g/dL — ABNORMAL LOW (ref 3.5–5.0)
Anion gap: 15 (ref 5–15)
BUN: 127 mg/dL — ABNORMAL HIGH (ref 8–23)
CO2: 33 mmol/L — ABNORMAL HIGH (ref 22–32)
Calcium: 8 mg/dL — ABNORMAL LOW (ref 8.9–10.3)
Chloride: 90 mmol/L — ABNORMAL LOW (ref 98–111)
Creatinine, Ser: 4.55 mg/dL — ABNORMAL HIGH (ref 0.44–1.00)
GFR calc Af Amer: 10 mL/min — ABNORMAL LOW (ref >60)
GFR calc non Af Amer: 9 mL/min — ABNORMAL LOW (ref >60)
Glucose, Bld: 181 mg/dL — ABNORMAL HIGH (ref 70–99)
Phosphorus: 4.3 mg/dL (ref 2.5–4.6)
Potassium: 2.7 mmol/L — CL (ref 3.5–5.1)
Sodium: 138 mmol/L (ref 135–145)

## 2019-01-25 LAB — HEPARIN LEVEL (UNFRACTIONATED)
Heparin Unfractionated: 0.9 IU/mL — ABNORMAL HIGH (ref 0.30–0.70)
Heparin Unfractionated: 1.09 IU/mL — ABNORMAL HIGH (ref 0.30–0.70)

## 2019-01-25 LAB — NOVEL CORONAVIRUS, NAA (HOSP ORDER, SEND-OUT TO REF LAB; TAT 18-24 HRS): SARS-CoV-2, NAA: NOT DETECTED

## 2019-01-25 LAB — UREA NITROGEN, URINE: Urea Nitrogen, Ur: 443 mg/dL

## 2019-01-25 LAB — SURGICAL PCR SCREEN
MRSA, PCR: NEGATIVE
Staphylococcus aureus: NEGATIVE

## 2019-01-25 LAB — APTT
aPTT: 107 seconds — ABNORMAL HIGH (ref 24–36)
aPTT: 122 seconds — ABNORMAL HIGH (ref 24–36)

## 2019-01-25 MED ORDER — POTASSIUM CHLORIDE CRYS ER 20 MEQ PO TBCR
40.0000 meq | EXTENDED_RELEASE_TABLET | Freq: Once | ORAL | Status: AC
  Administered 2019-01-25: 40 meq via ORAL
  Filled 2019-01-25: qty 2

## 2019-01-25 MED ORDER — MUPIROCIN 2 % EX OINT
1.0000 "application " | TOPICAL_OINTMENT | Freq: Two times a day (BID) | CUTANEOUS | Status: DC
  Filled 2019-01-25: qty 22

## 2019-01-25 NOTE — Consult Note (Addendum)
Hospital Consult    Reason for Consult:  Evaluation of L brachiocephalic fistula Requesting Physician:  Dr. Marval Regal MRN #:  [id number]  History of Present Illness: This is a 78 y.o. 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  Surgical history significant for left arm brachiocephalic fistula creation by Dr. Carlis Abbott on 11/17/2018.  Duplex last month demonstrated fistula with a diameter of greater than 6 mm and less than 6 mm from the skin.  We are asked to reevaluate AV fistula as patient will be started on hemodialysis tomorrow.  She is anticoagulated with Xarelto for atrial fibrillation which will be switched to Eliquis at discharge however is currently maintained with IV heparin.    Past Medical History:  Diagnosis Date  . Arthritis   . Asthma   . Atrial fibrillation ([place])   . CHF (congestive heart failure) ([place])   . CKD (chronic kidney disease), stage III   . COPD (chronic obstructive pulmonary disease) ([place])   . Diabetes mellitus    INSULIN DEPENDENT  . Gout   . Heart murmur    no issues per pt  . History of kidney stones   . Hyperlipemia   . Hypertension   . Psoriasis   . Renal disorder    congenital  . Single kidney   . Sleep apnea    doesn't use the Cpap    Past Surgical History:  Procedure Laterality Date  . ABDOMINAL HYSTERECTOMY    . AV FISTULA PLACEMENT Left 11/17/2018   Procedure: BRACHIO-CEPHALIC ARTERIOVENOUS (AV) FISTULA CREATION IN LEFT ARM;  Surgeon: Marty Heck, MD;  Location: [facility];  Service: Vascular;  Laterality: Left;  . CHOLECYSTECTOMY    . LEFT AND RIGHT HEART CATHETERIZATION WITH CORONARY ANGIOGRAM N/A 11/29/2013   Procedure: LEFT AND RIGHT HEART CATHETERIZATION WITH CORONARY ANGIOGRAM;  Surgeon: Jacolyn Reedy, MD;  Location: [facility] CATH LAB;  Service: Cardiovascular;  Laterality: N/A;  . RIGHT HEART CATH N/A 11/23/2017   Procedure: RIGHT HEART CATH;  Surgeon: Larey Dresser, MD;  Location: [facility] CV LAB;  Service: Cardiovascular;  Laterality: N/A;    Allergies  Allergen Reactions  . Eggs Or Egg-Derived Products Nausea And Vomiting  . Lisinopril Nausea And Vomiting  . Penicillins Nausea And Vomiting    Has patient had a PCN reaction causing immediate rash, facial/tongue/throat swelling, SOB or lightheadedness with hypotension: No Has patient had a PCN reaction causing severe rash involving mucus membranes or skin necrosis: No Has patient had a PCN reaction that required hospitalization: No Has patient had a PCN reaction occurring within the last 10 years: No If all of the above answers are "NO", then may proceed with Cephalosporin use.     Prior to Admission medications   Medication Sig Start Date End Date Taking? Authorizing Provider  albuterol (PROVENTIL HFA;VENTOLIN HFA) 108 (90 BASE) MCG/ACT inhaler Inhale 2 puffs into the lungs every 4 (four) hours as needed for shortness of breath.    Yes [provider]  atorvastatin (LIPITOR) 80 MG tablet Take 80 mg by mouth every evening.    Yes [provider]  calcitRIOL (ROCALTROL) 0.25 MCG capsule Take 0.25 mcg by mouth daily.   Yes [provider]  fluticasone (CUTIVATE) 0.005 % ointment Apply 1 application topically 2 (two) times daily as needed (skin irritation/rash).  10/29/17  Yes [provider]  guaiFENesin (MUCINEX) 600 MG 12 hr tablet Take 600 mg by  mouth 2 (two) times daily as needed for cough.    Yes [provider]  hydrALAZINE (APRESOLINE) 100 MG tablet Take 1 tablet (100 mg total) by mouth 3 (three) times daily. 10/24/18  Yes Rosita Fire, Brittainy M, PA-C  insulin aspart (NOVOLOG) 100 UNIT/ML injection Inject 10 Units into the skin daily with lunch.    Yes [provider]  insulin degludec (TRESIBA FLEXTOUCH) 100 UNIT/ML SOPN FlexTouch Pen Inject 25 Units into the skin daily.    Yes [provider]  isosorbide mononitrate (IMDUR) 30 MG 24 hr tablet Take 1 tablet by mouth once daily Patient taking differently: Take 30 mg by mouth daily.  12/25/18  Yes Larey Dresser, MD  latanoprost (XALATAN) 0.005 % ophthalmic solution Place 1 drop into both eyes at bedtime.   Yes [provider]  metolazone (ZAROXOLYN) 2.5 MG tablet Take 2.5 mg by mouth daily. 01/02/19  Yes [provider]  metoprolol succinate (TOPROL-XL) 25 MG 24 hr tablet Take 2 tablets (50 mg total) by mouth every morning AND 1 tablet (25 mg total) every evening. 09/18/18  Yes Larey Dresser, MD  [facility] 4 MG/0.1ML LIQD nasal spray kit Place 1 spray into the nose See admin instructions. Call EMS at first sign of opioid overdose. Place on spray in nose.  If no response, then place one spray ing the opposite side of the nose. 01/02/19  Yes [provider]  ondansetron (ZOFRAN) 4 MG tablet Take 4 mg by mouth 2 (two) times daily as needed for nausea.  10/26/18  Yes [provider]  pantoprazole (PROTONIX) 40 MG tablet Take 40 mg by mouth daily.  10/26/18  Yes [provider]  Rivaroxaban (XARELTO) 15 MG TABS tablet Take 1 tablet (15 mg total) by mouth daily with supper. 06/27/18  Yes Larey Dresser, MD  torsemide (DEMADEX) 20 MG tablet Take 4 tablets (80 mg total) by mouth 3 (three) times daily. Take an additional 40m (2 tabs) as needed Patient taking differently: Take 40-80 mg by mouth See admin instructions. Take 826mby mouth three times daily, may take an additional 4011m2 tabs) as needed for fluid or swelling. 11/27/18  Yes Clegg, Amy D, NP  traMADol (ULTRAM) 50 MG tablet Take 50 mg by mouth every 6 (six) hours as needed for severe pain (for pain.).    Yes [provider]  triamcinolone cream (KENALOG) 0.1 % Apply 1 application topically 2 (two) times daily as needed (skin rash/irritation.).    Yes [provider]    Social History   Socioeconomic History  .  Marital status: Widowed    Spouse name: Not on file  . Number of children: 2  . Years of education: Not on file  . Highest education level: Not on file  Occupational History  . Occupation: retired  SocScientific laboratory technician Financial resource strain: Not on file  . Food insecurity    Worry: Not on file    Inability: Not on file  . Transportation needs    Medical: Not on file    Non-medical: Not on file  Tobacco Use  . Smoking status: Former Smoker    Packs/day: 1.00    Years: 20.00    Pack years: 20.00    Types: Cigarettes    Quit date: 02/22/2009    Years since quitting: 9.9  . Smokeless tobacco: Never Used  Substance and Sexual Activity  . Alcohol use: Yes    Alcohol/week: 0.0 standard drinks  Comment: occasional beer  . Drug use: Not Currently    Comment: marijuana in the past  . Sexual activity: Never  Lifestyle  . Physical activity    Days per week: Not on file    Minutes per session: Not on file  . Stress: Not on file  Relationships  . Social Herbalist on phone: Not on file    Gets together: Not on file    Attends religious service: Not on file    Active member of club or organization: Not on file    Attends meetings of clubs or organizations: Not on file    Relationship status: Not on file  . Intimate partner violence    Fear of current or ex partner: Not on file    Emotionally abused: Not on file    Physically abused: Not on file    Forced sexual activity: Not on file  Other Topics Concern  . Not on file  Social History Narrative  . Not on file     Family History  Problem Relation Age of Onset  . Lupus Daughter   . Cancer Mother 65  . CVA Father 61  . Cancer Brother 45    ROS: Otherwise negative unless mentioned in HPI  Physical Examination  Vitals:   01/25/19 0438 01/25/19 0906  BP: 133/71 (!) 132/58  Pulse: 80 70  Resp: 16 18  Temp:  98.1 F (36.7 C)  SpO2: 98% 94%   Body mass index is 37.33 kg/m.  General:  WDWN in NAD Gait:  Not observed HENT: WNL, normocephalic Pulmonary: normal non-labored breathing Cardiac: irregular Abdomen:  soft, NT/ND, no masses Skin: without rashes Vascular Exam/Pulses: Symmetrical radial pulses Extremities: Easily palpable thrill at fistula anastomosis and again easily palpable thrill towards axilla; mid upper arm segment somewhat tortuous and has a deeper path Musculoskeletal: no muscle wasting or atrophy  Neurologic: A&O X 3;  No focal weakness or paresthesias are detected; speech is fluent/normal Psychiatric:  The pt has Normal affect. Lymph:  Unremarkable  CBC    Component Value Date/Time   WBC 14.4 (H) 01/25/2019 0323   RBC 4.61 01/25/2019 0323   HGB 12.5 01/25/2019 0323   HCT 38.8 01/25/2019 0323   PLT 309 01/25/2019 0323   MCV 84.2 01/25/2019 0323   MCH 27.1 01/25/2019 0323   MCHC 32.2 01/25/2019 0323   RDW 14.5 01/25/2019 0323   LYMPHSABS 1.7 01/24/2019 0922   MONOABS 1.0 01/24/2019 0922   EOSABS 0.1 01/24/2019 0922   BASOSABS 0.1 01/24/2019 0922    BMET    Component Value Date/Time   NA 138 01/25/2019 0323   K 2.7 (LL) 01/25/2019 0323   CL 90 (L) 01/25/2019 0323   CO2 33 (H) 01/25/2019 0323   GLUCOSE 181 (H) 01/25/2019 0323   BUN 127 (H) 01/25/2019 0323   CREATININE 4.55 (H) 01/25/2019 0323   CALCIUM 8.0 (L) 01/25/2019 0323   GFRNONAA 9 (L) 01/25/2019 0323   GFRAA 10 (L) 01/25/2019 0323    COAGS: Lab Results  Component Value Date   INR 1.1 11/17/2018     ASSESSMENT/PLAN: This is a 78 y.o. female with progressing CKD; plan is to start hemodialysis tomorrow per nephrology  Patent left brachiocephalic fistula with palpable thrill throughout upper arm In the mid segment of the upper arm fistula does take a tortuous path as well as a deeper path Plan will be to place a tunneled dialysis catheter tomorrow in the operating room; we  will also plan for a fistulogram after patient has been initiated on hemodialysis N.p.o. after midnight Consent     Dagoberto Ligas PA-C [facility] [phone]   I have interviewed and examined patient with PA and agree with assessment and plan above.  I discussed with patient's nephrologist and will plan for [facility] placement tomorrow.  Will possibly need fistulogram now that she is on dialysis which can be performed in the future.  Johaan Ryser C. Donzetta Matters, MD [facility] [place] Office: [phone] Pager: [phone]

## 2019-01-25 NOTE — Progress Notes (Signed)
CRITICAL VALUE ALERT  Critical Value: Potassium 2.63mmol/L  Date & Time Notied:  01/25/19 0445  Provider Notified:textpaged MD Mutaz,Elmahi  Orders Received/Actions taken: Yes

## 2019-01-25 NOTE — Progress Notes (Signed)
Patient ID: Stephanie Woodward, female   DOB: Oct 15, 1940, 78 y.o.   MRN: [id number] S: Still with nausea this am O:BP (!) 132/58 (BP Location: Right Arm)   Pulse 70   Temp 98.1 F (36.7 C) (Oral)   Resp 18   Ht 5\' 3"  (1.6 m)   Wt 95.6 kg   SpO2 94%   BMI 37.33 kg/m   Intake/Output Summary (Last 24 hours) at 01/25/2019 1332 Last data filed at 01/25/2019 0900 Gross per 24 hour  Intake 1354.33 ml  Output 300 ml  Net 1054.33 ml   Intake/Output: I/O last 3 completed shifts: In: 1234.3 [P.O.:240; I.V.:494.3; IV Piggyback:500] Out: 300 [Urine:300]  Intake/Output this shift:  Total I/O In: 120 [P.O.:120] Out: -  Weight change:  Gen: NAD CVS: no rub Resp: cta Abd: +BS, soft, NT/ND Ext: no edema Neuro: mild asterixis  Recent Labs  Lab 01/23/19 1822 01/23/19 2157 01/24/19 0820 01/25/19 0323  NA 137 133* 136 138  K 2.9* 3.0* 4.2 2.7*  CL 87* 84* 86* 90*  CO2 33* 33* 33* 33*  GLUCOSE 192* 486* 174* 181*  BUN 127* 130* 129* 127*  CREATININE 4.76* 4.82* 4.69* 4.55*  ALBUMIN  --   --  3.1* 2.6*  CALCIUM 8.2* 8.0* 8.4* 8.0*  PHOS  --   --   --  4.3  AST  --   --  26  --   ALT  --   --  11  --    Liver Function Tests: Recent Labs  Lab 01/24/19 0820 01/25/19 0323  AST 26  --   ALT 11  --   ALKPHOS 101  --   BILITOT 1.1  --   PROT 7.2  --   ALBUMIN 3.1* 2.6*   No results for input(s): LIPASE, AMYLASE in the last 168 hours. No results for input(s): AMMONIA in the last 168 hours. CBC: Recent Labs  Lab 01/23/19 2157 01/24/19 0922 01/25/19 0323  WBC 14.9* 14.2* 14.4*  NEUTROABS  --  11.3*  --   HGB 12.7 13.7 12.5  HCT 39.7 43.3 38.8  MCV 86.3 85.7 84.2  PLT 321 351 309   Cardiac Enzymes: Recent Labs  Lab 01/24/19 1729  CKTOTAL 100   CBG: Recent Labs  Lab 01/24/19 1908 01/24/19 2203 01/24/19 2313 01/25/19 0814 01/25/19 1142  GLUCAP 185* 316* 342* 140* 151*    Iron Studies: No results for input(s): IRON, TIBC, TRANSFERRIN, FERRITIN in the last 72  hours. Studies/Results: Dg Chest 2 View  Result Date: 01/23/2019 CLINICAL DATA:  Chest pain EXAM: CHEST - 2 VIEW COMPARISON:  09/20/2018 FINDINGS: Heart is borderline in size. Linear subsegmental atelectasis or scarring in the lingula. Mild vascular congestion. No overt edema or confluent opacities. No effusions or acute bony abnormality. IMPRESSION: Borderline heart size, mild vascular congestion. Lingular atelectasis or scarring. Electronically Signed   By: Rolm Baptise M.D.   On: 01/23/2019 22:22   . atorvastatin  80 mg Oral QPM  . calcitRIOL  0.25 mcg Oral Daily  . hydrALAZINE  100 mg Oral TID  . insulin aspart  0-6 Units Subcutaneous TID WC  . insulin glargine  25 Units Subcutaneous Daily  . isosorbide mononitrate  30 mg Oral Daily  . latanoprost  1 drop Both Eyes QHS  . metoprolol succinate  25 mg Oral QHS  . metoprolol succinate  50 mg Oral Daily  . sodium chloride flush  3 mL Intravenous Once  . sodium chloride flush  3 mL Intravenous  Q12H    BMET    Component Value Date/Time   NA 138 01/25/2019 0323   K 2.7 (LL) 01/25/2019 0323   CL 90 (L) 01/25/2019 0323   CO2 33 (H) 01/25/2019 0323   GLUCOSE 181 (H) 01/25/2019 0323   BUN 127 (H) 01/25/2019 0323   CREATININE 4.55 (H) 01/25/2019 0323   CALCIUM 8.0 (L) 01/25/2019 0323   GFRNONAA 9 (L) 01/25/2019 0323   GFRAA 10 (L) 01/25/2019 0323   CBC    Component Value Date/Time   WBC 14.4 (H) 01/25/2019 0323   RBC 4.61 01/25/2019 0323   HGB 12.5 01/25/2019 0323   HCT 38.8 01/25/2019 0323   PLT 309 01/25/2019 0323   MCV 84.2 01/25/2019 0323   MCH 27.1 01/25/2019 0323   MCHC 32.2 01/25/2019 0323   RDW 14.5 01/25/2019 0323   LYMPHSABS 1.7 01/24/2019 0922   MONOABS 1.0 01/24/2019 0922   EOSABS 0.1 01/24/2019 0922   BASOSABS 0.1 01/24/2019 0922     Assessment/Plan: 1.  AKI/CKD stage 4- presumably due to volume depletion in setting of N/V/D and anorexia.  She has had some nausea prior to admission but did not have vomiting.   Her daughter reports that she has not been feeling well for the past several weeks.  We discussed the chronicity and severity of her CKD and is willing to proceed with HD if her renal function does not improve with gentle hydration.  She does have uremic symptoms with n/v/anorexia and asterixis, however she was on xarelto and discussed with Dr. Tamala Julian to stop xarelto and transition to IV heparin for now as she will likely require [facility] placement if she doesn't improve.  Her AVF is not yet mature enough to be used. 1. Gentle IVF's overnight with minimal improvement of BUN/Cr 2. Hold diuretics for now as well as BP meds due to low BP 3. No urgent indication for HD but will likely proceed tomorrow if no significant improvement. 4. Have consulted [facility] to evaluate AVF as it does not appear mature for use and may require intervention as well as a TDC placement for HD tomorrow. 2. N/V/D- negative covid test.  Normal LFT's.  Unclear if this is infectious.  Recommend further workup per primary 3. Hypokalemia- replete and follow 4. HTN- low bp, hold meds for now as well as diuretics 5. Anemia of CKd- stable 6. 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 Combined systolic and diastolic CHF- hypovolemic on exam today.  Hold diuretics and gentle hydration overnight. 8. P. Atrial fibrillation- need to stop xarelto due to advanced CKD and eventually switch to Eliquis but agree with transition to iv heparin as she may require placement of TDC soon.  9. DM- per primary svc 10. Vascular access- LUE AVF placed 11/17/18 but not quite ready for cannulation. May need TDC.  VVS consulted today. 11. SHPTH- on calcitriol.  Follow labs.  Donetta Potts, MD [employer] [phone]

## 2019-01-25 NOTE — Progress Notes (Signed)
[place] for Heparin Indication: atrial fibrillation  Allergies  Allergen Reactions  . Eggs Or Egg-Derived Products Nausea And Vomiting  . Lisinopril Nausea And Vomiting  . Penicillins Nausea And Vomiting    Has patient had a PCN reaction causing immediate rash, facial/tongue/throat swelling, SOB or lightheadedness with hypotension: No Has patient had a PCN reaction causing severe rash involving mucus membranes or skin necrosis: No Has patient had a PCN reaction that required hospitalization: No Has patient had a PCN reaction occurring within the last 10 years: No If all of the above answers are "NO", then may proceed with Cephalosporin use.     Patient Measurements: Height: 5\' 3"  (160 cm) Weight: 210 lb 12.2 oz (95.6 kg) IBW/kg (Calculated) : 52.4 Heparin Dosing Weight: 74.4kg  Vital Signs: Temp: 98.1 F (36.7 C) (12/03 0906) Temp Source: Oral (12/03 0906) BP: 132/58 (12/03 0906) Pulse Rate: 70 (12/03 0906)  Labs: Recent Labs    01/23/19 2157 01/24/19 0111 01/24/19 0820 01/24/19 0922 01/24/19 1729 01/24/19 2318 01/25/19 0323 01/25/19 0808  HGB 12.7  --   --  13.7  --   --  12.5  --   HCT 39.7  --   --  43.3  --   --  38.8  --   PLT 321  --   --  351  --   --  309  --   APTT  --   --   --   --   --  58*  --  107*  HEPARINUNFRC  --   --   --   --   --  0.39  --  0.90*  CREATININE 4.82*  --  4.69*  --   --   --  4.55*  --   CKTOTAL  --   --   --   --  100  --   --   --   TROPONINIHS 38* 48*  --  59*  51*  --   --   --   --     Estimated Creatinine Clearance: 11.6 mL/min (A) (by C-G formula based on SCr of 4.55 mg/dL (H)).   Medical History: Past Medical History:  Diagnosis Date  . Arthritis   . Asthma   . Atrial fibrillation ([place])   . CHF (congestive heart failure) ([place])   . CKD (chronic kidney disease), stage III   . COPD (chronic obstructive pulmonary disease) ([place])   . Diabetes mellitus    INSULIN DEPENDENT  .  Gout   . Heart murmur    no issues per pt  . History of kidney stones   . Hyperlipemia   . Hypertension   . Psoriasis   . Renal disorder    congenital  . Single kidney   . Sleep apnea    doesn't use the Cpap    Medications:  Infusions:  . sodium chloride 50 mL/hr at 01/24/19 2144  . heparin 1,150 Units/hr (01/25/19 0020)    Assessment: 48 yof presented to the ED with fatigue, N/V and worsening renal function. She is on xarelto PTA for history of afib. To transition to IV heparin for now due to renal function. Baseline CBC is WNL. Last dose of xarelto was on 11/30.   12/3 AM update:  APTT above goal at 107 sec, heparin level 0.9. Using aPTT due to Xarelto influence on heparin levels, no bleeding noted. No issues per RN  Goal of Therapy:  Heparin level 0.3-0.7  units/ml aPTT 66-102 seconds Monitor platelets by anticoagulation protocol: Yes   Plan:  Decrease heparin to 1100 units/hr Re-check heparin level and aPTT in 8 hours  Nicle Connole A. Levada Dy, PharmD, BCPS, FNKF Clinical Pharmacist [place] Please utilize Amion for appropriate phone number to reach the unit pharmacist ([place])

## 2019-01-25 NOTE — Progress Notes (Signed)
[facility] for Heparin Indication: atrial fibrillation  Allergies  Allergen Reactions  . Eggs Or Egg-Derived Products Nausea And Vomiting  . Lisinopril Nausea And Vomiting  . Penicillins Nausea And Vomiting    Has patient had a PCN reaction causing immediate rash, facial/tongue/throat swelling, SOB or lightheadedness with hypotension: No Has patient had a PCN reaction causing severe rash involving mucus membranes or skin necrosis: No Has patient had a PCN reaction that required hospitalization: No Has patient had a PCN reaction occurring within the last 10 years: No If all of the above answers are "NO", then may proceed with Cephalosporin use.     Patient Measurements: Height: 5\' 3"  (160 cm) Weight: 210 lb 12.2 oz (95.6 kg) IBW/kg (Calculated) : 52.4 Heparin Dosing Weight: 74.4kg  Vital Signs: Temp: 97.8 F (36.6 C) (12/03 1631) Temp Source: Oral (12/03 1631) BP: 130/52 (12/03 1631) Pulse Rate: 67 (12/03 1631)  Labs: Recent Labs    01/23/19 2157 01/24/19 0111 01/24/19 0820 01/24/19 0922 01/24/19 1729 01/24/19 2318 01/25/19 0323 01/25/19 0808 01/25/19 1715  HGB 12.7  --   --  13.7  --   --  12.5  --   --   HCT 39.7  --   --  43.3  --   --  38.8  --   --   PLT 321  --   --  351  --   --  309  --   --   APTT  --   --   --   --   --  58*  --  107* 122*  HEPARINUNFRC  --   --   --   --   --  0.39  --  0.90* 1.09*  CREATININE 4.82*  --  4.69*  --   --   --  4.55*  --   --   CKTOTAL  --   --   --   --  100  --   --   --   --   TROPONINIHS 38* 48*  --  59*  51*  --   --   --   --   --     Estimated Creatinine Clearance: 11.6 mL/min (A) (by C-G formula based on SCr of 4.55 mg/dL (H)).   Medical History: Past Medical History:  Diagnosis Date  . Arthritis   . Asthma   . Atrial fibrillation ([place])   . CHF (congestive heart failure) ([place])   . CKD (chronic kidney disease), stage III   . COPD (chronic obstructive pulmonary disease)  ([place])   . Diabetes mellitus    INSULIN DEPENDENT  . Gout   . Heart murmur    no issues per pt  . History of kidney stones   . Hyperlipemia   . Hypertension   . Psoriasis   . Renal disorder    congenital  . Single kidney   . Sleep apnea    doesn't use the Cpap    Medications:  Infusions:  . sodium chloride 50 mL/hr at 01/24/19 2144  . heparin 1,100 Units/hr (01/25/19 1653)    Assessment: 37 yof presented to the ED with fatigue, N/V and worsening renal function. She is on xarelto PTA for history of afib. To transition to IV heparin for now due to renal function. Baseline CBC is WNL. Last dose of xarelto was on 11/30.   12/3 PM update:  APTT above goal at 122 sec, heparin level 1.09. Using aPTT  due to Xarelto influence on heparin levels, no bleeding noted. No issues per RN  Goal of Therapy:  Heparin level 0.3-0.7 units/ml aPTT 66-102 seconds Monitor platelets by anticoagulation protocol: Yes   Plan:  Decrease heparin to 900 units/hr Re-check heparin level and aPTT in 8 hours  Alanda Slim, PharmD, [facility] Clinical Pharmacist Please see AMION for all Pharmacists' Contact Phone Numbers 01/25/2019, 5:52 PM

## 2019-01-25 NOTE — Progress Notes (Signed)
New Admission Note:   Arrival Method: from ED via wheelchair Mental Orientation: A&Ox4 Telemetry: Box 07, CCMD notified Assessment: to be completed Skin: Intact, refer to flowsheet IV: RAC, infusing Pain: 6/10 Tubes: None Safety Measures: Safety Fall Prevention Plan has been discussed  Admission: to be completed 5 [place] Orientation: Patient has been orientated to the room, unit and staff.   Family: none at bedside  Orders to be reviewed and implemented. Will continue to monitor the patient. Call light has been placed within reach and bed alarm has been activated.

## 2019-01-25 NOTE — Progress Notes (Signed)
PROGRESS NOTE    Stephanie Woodward  PXT:[id number] DOB: Apr 15, 1940 DOA: 01/23/2019 PCP: Alroy Dust, L.Marlou Sa, MD     Brief Narrative:  Stephanie Woodward is a 78 y.o. female with medical history significant of HTN, HLD, CHF, IDDM, solitary kidney, chronic kidney disease stage IV followed by Dr. Clover Mealy, chronic atrial fibrillation on Xarelto.  She presents with complaints of nausea and vomiting.  Patient reports being nauseated for quite some time, but started having intermittent vomiting 4 days ago.  Emesis is noted to be nonbloody and nonbilious and appears.  Associated symptoms include lower abdominal cramping and worsening memory.  Daughter notes that the patient diabetes has been wildly uncontrolled with blood sugars anywhere from 42->400.  She was finally able to get set up with the endocrinologist and has an appointment sometime next week.  She was seen by her nephrologist 3-4 weeks ago and started on metolazone.  Patient had a fistula placed at the end of September 2020 by Dr. Carlis Abbott vascular surgery, but it hasn't fully matured. Nephrology was consulted.  New events last 24 hours / Subjective: Having nausea, small amount of vomit this morning. Had some apple sauce but did not want much of her breakfast. Denies any abdominal pain. Had 2 episodes of diarrhea yesterday but none today.   Assessment & Plan:   Principal Problem:   Acute renal failure superimposed on chronic kidney disease ([place]) Active Problems:   CHF (congestive heart failure) (HCC)   Essential hypertension   Diabetes mellitus with complication (HCC)   Leukocytosis   Hypokalemia   Atrial fibrillation, chronic (HCC)   Chronic anticoagulation   AKI on CKD stage IV -At baseline patient's creatinine previously had been around 2.  Patient presents with creatinine elevated up to 4.82 with a BUN 130.  Suspect prerenal cause of as patient had recently started diuretics and has also had N/V, but there is concern for patient becoming  uremic -Nephrology following; patient may need TDC and HD during hospitalization  -Holding diuretics. Giving IVF   Nausea and vomiting -Appears likely multifactorial given worsening kidney function with BUN elevated  -Does not seem to be infectious in etiology -Supportive care   Prolonged QT interval -QTc on admission noted to be 510 -Correct electrolyte abnormalities -Avoid QT prolonging medication -Recheck EKG   Persistent atrial fibrillation on chronic anticoagulation -Held Xarelto due to kidney function -Heparin drip per pharmacy for now  Chronic combined systolic and diastolic CHF -Last EF noted to be 45 to 50% with grade 2 diastolic dysfunction in 62/7035  Patient does not clinically appear fluid overloaded at this time  Essential hypertension -Continue metoprolol, hydralazine  CAD -Continue imdur   Diabetes mellitus type 2 with hyperglycemia -Hemoglobin A1c -Lantus, novolog SSI -Diabetic coordinator consult  -Blood sugar this morning 140, much better since admission  -Avoid hypoglycemia   Hyperlipidemia -Continue atorvastatin  GERD -Hold Protonix due to prolonged QT  Hypokalemia -Replace, trend  -Check Mg    DVT prophylaxis: IV heparin (on Xarelto PTA) Code Status: Full Family Communication: Spoke with daughter on speaker phone during my exam of patient  Disposition Plan: Pending Nephrology eval and plan, improvement in N/V   Consultants:   Nephrology  Procedures:   None   Antimicrobials:  Anti-infectives (From admission, onward)   None        Objective: Vitals:   01/24/19 2232 01/24/19 2311 01/25/19 0438 01/25/19 0906  BP: 118/64 140/75 133/71 (!) 132/58  Pulse: 79 87 80 70  Resp: 17 18 16  18  Temp:  98.3 F (36.8 C)  98.1 F (36.7 C)  TempSrc:  Oral  Oral  SpO2: 96% 95% 98% 94%  Weight:  95.6 kg    Height:        Intake/Output Summary (Last 24 hours) at 01/25/2019 1054 Last data filed at 01/25/2019 0900 Gross per 24  hour  Intake 1354.33 ml  Output 300 ml  Net 1054.33 ml   Filed Weights   01/24/19 1415 01/24/19 2311  Weight: 95.3 kg 95.6 kg    Examination:  General exam: Appears calm and comfortable  Respiratory system: Clear to auscultation. Respiratory effort normal. No respiratory distress. No conversational dyspnea.  Cardiovascular system: S1 & S2 heard. No murmurs. No pedal edema. Gastrointestinal system: Abdomen is nondistended, soft and nontender. Normal bowel sounds heard. Central nervous system: Alert and oriented. No focal neurological deficits. Speech clear.  Extremities: Symmetric in appearance  Skin: No rashes, lesions or ulcers on exposed skin  Psychiatry: Judgement and insight appear normal. Mood & affect appropriate.   Data Reviewed: I have personally reviewed following labs and imaging studies  CBC: Recent Labs  Lab 01/23/19 2157 01/24/19 0922 01/25/19 0323  WBC 14.9* 14.2* 14.4*  NEUTROABS  --  11.3*  --   HGB 12.7 13.7 12.5  HCT 39.7 43.3 38.8  MCV 86.3 85.7 84.2  PLT 321 351 026   Basic Metabolic Panel: Recent Labs  Lab 01/23/19 1822 01/23/19 2157 01/24/19 0820 01/24/19 0922 01/25/19 0323  NA 137 133* 136  --  138  K 2.9* 3.0* 4.2  --  2.7*  CL 87* 84* 86*  --  90*  CO2 33* 33* 33*  --  33*  GLUCOSE 192* 486* 174*  --  181*  BUN 127* 130* 129*  --  127*  CREATININE 4.76* 4.82* 4.69*  --  4.55*  CALCIUM 8.2* 8.0* 8.4*  --  8.0*  MG  --   --   --  1.9  --   PHOS  --   --   --   --  4.3   GFR: Estimated Creatinine Clearance: 11.6 mL/min (A) (by C-G formula based on SCr of 4.55 mg/dL (H)). Liver Function Tests: Recent Labs  Lab 01/24/19 0820 01/25/19 0323  AST 26  --   ALT 11  --   ALKPHOS 101  --   BILITOT 1.1  --   PROT 7.2  --   ALBUMIN 3.1* 2.6*   No results for input(s): LIPASE, AMYLASE in the last 168 hours. No results for input(s): AMMONIA in the last 168 hours. Coagulation Profile: No results for input(s): INR, PROTIME in the last 168  hours. Cardiac Enzymes: Recent Labs  Lab 01/24/19 1729  CKTOTAL 100   BNP (last 3 results) No results for input(s): PROBNP in the last 8760 hours. HbA1C: Recent Labs    01/24/19 1729  HGBA1C 8.9*   CBG: Recent Labs  Lab 01/24/19 1745 01/24/19 1908 01/24/19 2203 01/24/19 2313 01/25/19 0814  GLUCAP 201* 185* 316* 342* 140*   Lipid Profile: No results for input(s): CHOL, HDL, LDLCALC, TRIG, CHOLHDL, LDLDIRECT in the last 72 hours. Thyroid Function Tests: No results for input(s): TSH, T4TOTAL, FREET4, T3FREE, THYROIDAB in the last 72 hours. Anemia Panel: No results for input(s): VITAMINB12, FOLATE, FERRITIN, TIBC, IRON, RETICCTPCT in the last 72 hours. Sepsis Labs: No results for input(s): PROCALCITON, LATICACIDVEN in the last 168 hours.  Recent Results (from the past 240 hour(s))  SARS CORONAVIRUS 2 (TAT 6-24 HRS) Nasopharyngeal Nasopharyngeal  Swab     Status: None   Collection Time: 01/24/19 12:19 PM   Specimen: Nasopharyngeal Swab  Result Value Ref Range Status   SARS Coronavirus 2 NEGATIVE NEGATIVE Final    Comment: (NOTE) SARS-CoV-2 target nucleic acids are NOT DETECTED. The SARS-CoV-2 RNA is generally detectable in upper and lower respiratory specimens during the acute phase of infection. Negative results do not preclude SARS-CoV-2 infection, do not rule out co-infections with other pathogens, and should not be used as the sole basis for treatment or other patient management decisions. Negative results must be combined with clinical observations, patient history, and epidemiological information. The expected result is Negative. Fact Sheet for Patients: SugarRoll.be Fact Sheet for Healthcare Providers: https://www.woods-mathews.com/ This test is not yet approved or cleared by the [place] FDA and  has been authorized for detection and/or diagnosis of SARS-CoV-2 by FDA under an Emergency Use Authorization (EUA). This  EUA will remain  in effect (meaning this test can be used) for the duration of the COVID-19 declaration under Section 56 4(b)(1) of the Act, 21 U.S.C. section 360bbb-3(b)(1), unless the authorization is terminated or revoked sooner. Performed at [facility], [place] [address] [place], [place] [address]       Radiology Studies: Dg Chest 2 View  Result Date: 01/23/2019 CLINICAL DATA:  Chest pain EXAM: CHEST - 2 VIEW COMPARISON:  09/20/2018 FINDINGS: Heart is borderline in size. Linear subsegmental atelectasis or scarring in the lingula. Mild vascular congestion. No overt edema or confluent opacities. No effusions or acute bony abnormality. IMPRESSION: Borderline heart size, mild vascular congestion. Lingular atelectasis or scarring. Electronically Signed   By: Rolm Baptise M.D.   On: 01/23/2019 22:22      Scheduled Meds:  atorvastatin  80 mg Oral QPM   calcitRIOL  0.25 mcg Oral Daily   hydrALAZINE  100 mg Oral TID   insulin aspart  0-6 Units Subcutaneous TID WC   insulin glargine  25 Units Subcutaneous Daily   isosorbide mononitrate  30 mg Oral Daily   latanoprost  1 drop Both Eyes QHS   metoprolol succinate  25 mg Oral QHS   metoprolol succinate  50 mg Oral Daily   sodium chloride flush  3 mL Intravenous Once   sodium chloride flush  3 mL Intravenous Q12H   Continuous Infusions:  sodium chloride 50 mL/hr at 01/24/19 2144   heparin 1,100 Units/hr (01/25/19 1000)     LOS: 1 day      Time spent: 40 minutes   Dessa Phi, DO [facility] 01/25/2019, 10:54 AM   Available via Epic secure chat 7am-7pm After these hours, please refer to coverage provider listed on [email or web address]

## 2019-01-25 NOTE — Progress Notes (Signed)
[place] for Heparin Indication: atrial fibrillation  Allergies  Allergen Reactions  . Eggs Or Egg-Derived Products Nausea And Vomiting  . Lisinopril Nausea And Vomiting  . Penicillins Nausea And Vomiting    Has patient had a PCN reaction causing immediate rash, facial/tongue/throat swelling, SOB or lightheadedness with hypotension: No Has patient had a PCN reaction causing severe rash involving mucus membranes or skin necrosis: No Has patient had a PCN reaction that required hospitalization: No Has patient had a PCN reaction occurring within the last 10 years: No If all of the above answers are "NO", then may proceed with Cephalosporin use.     Patient Measurements: Height: 5\' 3"  (160 cm) Weight: 210 lb 1.6 oz (95.3 kg) IBW/kg (Calculated) : 52.4 Heparin Dosing Weight: 74.4kg  Vital Signs: Temp: 98.3 F (36.8 C) (12/02 2311) Temp Source: Oral (12/02 2311) BP: 140/75 (12/02 2311) Pulse Rate: 87 (12/02 2311)  Labs: Recent Labs    01/23/19 1822 01/23/19 2157 01/24/19 0111 01/24/19 0820 01/24/19 0922 01/24/19 1729 01/24/19 2318  HGB  --  12.7  --   --  13.7  --   --   HCT  --  39.7  --   --  43.3  --   --   PLT  --  321  --   --  351  --   --   APTT  --   --   --   --   --   --  58*  HEPARINUNFRC  --   --   --   --   --   --  0.39  CREATININE 4.76* 4.82*  --  4.69*  --   --   --   CKTOTAL  --   --   --   --   --  100  --   TROPONINIHS  --  38* 48*  --  59*  51*  --   --     Estimated Creatinine Clearance: 11.2 mL/min (A) (by C-G formula based on SCr of 4.69 mg/dL (H)).   Medical History: Past Medical History:  Diagnosis Date  . Arthritis   . Asthma   . Atrial fibrillation ([place])   . CHF (congestive heart failure) ([place])   . CKD (chronic kidney disease), stage III   . COPD (chronic obstructive pulmonary disease) ([place])   . Diabetes mellitus    INSULIN DEPENDENT  . Gout   . Heart murmur    no issues per pt  . History  of kidney stones   . Hyperlipemia   . Hypertension   . Psoriasis   . Renal disorder    congenital  . Single kidney   . Sleep apnea    doesn't use the Cpap    Medications:  Infusions:  . sodium chloride 50 mL/hr at 01/24/19 2144  . heparin 1,000 Units/hr (01/24/19 1649)    Assessment: 37 yof presented to the ED with fatigue, N/V and worsening renal function. She is on xarelto PTA for history of afib. To transition to IV heparin for now due to renal function. Baseline CBC is WNL. Last dose of xarelto was on 11/30.   12/3 AM update:  APTT below goal, using aPTT due to Xarelto influence on heparin levels, no issues per RN.   Goal of Therapy:  Heparin level 0.3-0.7 units/ml aPTT 66-102 seconds Monitor platelets by anticoagulation protocol: Yes   Plan:  Inc heparin to 1150 units/hr Re-check heparin level and aPTT  in 8 hours  Narda Bonds, PharmD, [facility] Clinical Pharmacist Phone: [phone]

## 2019-01-26 ENCOUNTER — Other Ambulatory Visit: Payer: Self-pay

## 2019-01-26 ENCOUNTER — Inpatient Hospital Stay (HOSPITAL_COMMUNITY): Payer: HMO

## 2019-01-26 ENCOUNTER — Inpatient Hospital Stay (HOSPITAL_COMMUNITY): Payer: HMO | Admitting: Certified Registered Nurse Anesthetist

## 2019-01-26 ENCOUNTER — Encounter (HOSPITAL_COMMUNITY): Payer: Self-pay

## 2019-01-26 ENCOUNTER — Encounter (HOSPITAL_COMMUNITY)
Admission: EM | Disposition: A | Discharge status: Home / Self Care | Payer: Self-pay | Source: Home / Self Care | Attending: Internal Medicine

## 2019-01-26 DIAGNOSIS — N185 Chronic kidney disease, stage 5: Secondary | ICD-10-CM

## 2019-01-26 HISTORY — PX: INSERTION OF DIALYSIS CATHETER: SHX1324

## 2019-01-26 LAB — CBC
HCT: 37.2 % (ref 36.0–46.0)
Hemoglobin: 11.4 g/dL — ABNORMAL LOW (ref 12.0–15.0)
MCH: 26.8 pg (ref 26.0–34.0)
MCHC: 30.6 g/dL (ref 30.0–36.0)
MCV: 87.3 fL (ref 80.0–100.0)
Platelets: 283 10*3/uL (ref 150–400)
RBC: 4.26 MIL/uL (ref 3.87–5.11)
RDW: 14.6 % (ref 11.5–15.5)
WBC: 12.3 10*3/uL — ABNORMAL HIGH (ref 4.0–10.5)
nRBC: 0 % (ref 0.0–0.2)

## 2019-01-26 LAB — HEPATITIS B SURFACE ANTIGEN: Hepatitis B Surface Ag: NONREACTIVE

## 2019-01-26 LAB — GLUCOSE, CAPILLARY
Glucose-Capillary: 166 mg/dL — ABNORMAL HIGH (ref 70–99)
Glucose-Capillary: 97 mg/dL (ref 70–99)
Glucose-Capillary: 104 mg/dL — ABNORMAL HIGH (ref 70–99)
Glucose-Capillary: 121 mg/dL — ABNORMAL HIGH (ref 70–99)
Glucose-Capillary: 175 mg/dL — ABNORMAL HIGH (ref 70–99)
Glucose-Capillary: 263 mg/dL — ABNORMAL HIGH (ref 70–99)

## 2019-01-26 LAB — BASIC METABOLIC PANEL
Anion gap: 14 (ref 5–15)
BUN: 116 mg/dL — ABNORMAL HIGH (ref 8–23)
CO2: 29 mmol/L (ref 22–32)
Calcium: 8.1 mg/dL — ABNORMAL LOW (ref 8.9–10.3)
Chloride: 92 mmol/L — ABNORMAL LOW (ref 98–111)
Creatinine, Ser: 4.12 mg/dL — ABNORMAL HIGH (ref 0.44–1.00)
GFR calc Af Amer: 11 mL/min — ABNORMAL LOW (ref >60)
GFR calc non Af Amer: 10 mL/min — ABNORMAL LOW (ref >60)
Glucose, Bld: 304 mg/dL — ABNORMAL HIGH (ref 70–99)
Potassium: 3.3 mmol/L — ABNORMAL LOW (ref 3.5–5.1)
Sodium: 135 mmol/L (ref 135–145)

## 2019-01-26 LAB — HEPATITIS B SURFACE ANTIBODY,QUALITATIVE: Hep B S Ab: NONREACTIVE

## 2019-01-26 LAB — HEPARIN LEVEL (UNFRACTIONATED)
Heparin Unfractionated: 0.58 IU/mL (ref 0.30–0.70)
Heparin Unfractionated: 0.49 IU/mL (ref 0.30–0.70)

## 2019-01-26 LAB — MAGNESIUM: Magnesium: 1.8 mg/dL (ref 1.7–2.4)

## 2019-01-26 LAB — APTT: aPTT: 85 seconds — ABNORMAL HIGH (ref 24–36)

## 2019-01-26 SURGERY — INSERTION OF DIALYSIS CATHETER
Anesthesia: Monitor Anesthesia Care | Site: Neck | Laterality: Right

## 2019-01-26 MED ORDER — ONDANSETRON HCL 4 MG/2ML IJ SOLN
INTRAMUSCULAR | Status: AC
  Filled 2019-01-26: qty 2

## 2019-01-26 MED ORDER — SODIUM CHLORIDE 0.9 % IV SOLN
INTRAVENOUS | Status: DC | PRN
  Administered 2019-01-26: 500 mL

## 2019-01-26 MED ORDER — PROPOFOL 500 MG/50ML IV EMUL
INTRAVENOUS | Status: DC | PRN
  Administered 2019-01-26: 50 ug/kg/min via INTRAVENOUS

## 2019-01-26 MED ORDER — LIDOCAINE 2% (20 MG/ML) 5 ML SYRINGE
INTRAMUSCULAR | Status: AC
  Filled 2019-01-26: qty 5

## 2019-01-26 MED ORDER — LIDOCAINE-EPINEPHRINE 1 %-1:100000 IJ SOLN
INTRAMUSCULAR | Status: DC | PRN
  Administered 2019-01-26: 19 mL

## 2019-01-26 MED ORDER — HEPARIN SODIUM (PORCINE) 1000 UNIT/ML IJ SOLN
INTRAMUSCULAR | Status: DC | PRN
  Administered 2019-01-26: 3400 [IU]

## 2019-01-26 MED ORDER — SODIUM CHLORIDE 0.9 % IV SOLN
INTRAVENOUS | Status: DC
  Administered 2019-01-26: 12:00:00 via INTRAVENOUS

## 2019-01-26 MED ORDER — EPHEDRINE SULFATE-NACL 50-0.9 MG/10ML-% IV SOSY
PREFILLED_SYRINGE | INTRAVENOUS | Status: DC | PRN
  Administered 2019-01-26: 5 mg via INTRAVENOUS

## 2019-01-26 MED ORDER — FENTANYL CITRATE (PF) 100 MCG/2ML IJ SOLN: 25.0000 ug | INTRAMUSCULAR | Status: DC | PRN

## 2019-01-26 MED ORDER — CHLORHEXIDINE GLUCONATE CLOTH 2 % EX PADS
6.0000 | MEDICATED_PAD | Freq: Every day | CUTANEOUS | Status: DC
  Administered 2019-01-27 – 2019-01-31 (×5): 6 via TOPICAL

## 2019-01-26 MED ORDER — CEFAZOLIN SODIUM-DEXTROSE 2-3 GM-%(50ML) IV SOLR
INTRAVENOUS | Status: DC | PRN
  Administered 2019-01-26: 2 g via INTRAVENOUS

## 2019-01-26 MED ORDER — LIDOCAINE-EPINEPHRINE 1 %-1:100000 IJ SOLN
INTRAMUSCULAR | Status: AC
  Filled 2019-01-26: qty 1

## 2019-01-26 MED ORDER — PROMETHAZINE HCL 25 MG/ML IJ SOLN: 6.2500 mg | INTRAMUSCULAR | Status: DC | PRN

## 2019-01-26 MED ORDER — PHENYLEPHRINE 40 MCG/ML (10ML) SYRINGE FOR IV PUSH (FOR BLOOD PRESSURE SUPPORT)
PREFILLED_SYRINGE | INTRAVENOUS | Status: DC | PRN
  Administered 2019-01-26: 100 ug via INTRAVENOUS
  Administered 2019-01-26 (×2): 80 ug via INTRAVENOUS
  Administered 2019-01-26: 100 ug via INTRAVENOUS

## 2019-01-26 MED ORDER — FENTANYL CITRATE (PF) 250 MCG/5ML IJ SOLN
INTRAMUSCULAR | Status: AC
  Filled 2019-01-26: qty 5

## 2019-01-26 MED ORDER — ONDANSETRON HCL 4 MG/2ML IJ SOLN: 4.0000 mg | Freq: Once | INTRAMUSCULAR | Status: DC | PRN

## 2019-01-26 MED ORDER — PROPOFOL 10 MG/ML IV BOLUS
INTRAVENOUS | Status: DC | PRN
  Administered 2019-01-26: 20 mg via INTRAVENOUS

## 2019-01-26 MED ORDER — PROPOFOL 10 MG/ML IV BOLUS
INTRAVENOUS | Status: AC
  Filled 2019-01-26: qty 20

## 2019-01-26 MED ORDER — FENTANYL CITRATE (PF) 100 MCG/2ML IJ SOLN
INTRAMUSCULAR | Status: DC | PRN
  Administered 2019-01-26: 50 ug via INTRAVENOUS

## 2019-01-26 MED ORDER — ONDANSETRON HCL 4 MG/2ML IJ SOLN
INTRAMUSCULAR | Status: DC | PRN
  Administered 2019-01-26: 4 mg via INTRAVENOUS

## 2019-01-26 SURGICAL SUPPLY — 45 items
ADH SKN CLS APL DERMABOND .7 (GAUZE/BANDAGES/DRESSINGS) ×1
BAG DECANTER FOR FLEXI CONT (MISCELLANEOUS) ×2 IMPLANT
BIOPATCH RED 1 DISK 7.0 (GAUZE/BANDAGES/DRESSINGS) ×2 IMPLANT
CATH PALINDROME RT-P 15FX19CM (CATHETERS) IMPLANT
CATH PALINDROME RT-P 15FX23CM (CATHETERS) ×1 IMPLANT
CATH PALINDROME RT-P 15FX28CM (CATHETERS) IMPLANT
CATH PALINDROME RT-P 15FX55CM (CATHETERS) IMPLANT
CATH STRAIGHT 5FR 65CM (CATHETERS) IMPLANT
COVER PROBE W GEL 5X96 (DRAPES) ×2 IMPLANT
COVER SURGICAL LIGHT HANDLE (MISCELLANEOUS) ×2 IMPLANT
COVER WAND RF STERILE (DRAPES) ×2 IMPLANT
DECANTER SPIKE VIAL GLASS SM (MISCELLANEOUS) ×2 IMPLANT
DERMABOND ADVANCED (GAUZE/BANDAGES/DRESSINGS) ×1
DERMABOND ADVANCED .7 DNX12 (GAUZE/BANDAGES/DRESSINGS) ×1 IMPLANT
DRAPE C-ARM 42X72 X-RAY (DRAPES) ×2 IMPLANT
DRAPE CHEST BREAST 15X10 FENES (DRAPES) ×2 IMPLANT
GAUZE 4X4 16PLY RFD (DISPOSABLE) ×2 IMPLANT
GLOVE BIO SURGEON STRL SZ7.5 (GLOVE) ×2 IMPLANT
GLOVE BIOGEL PI IND STRL 8 (GLOVE) ×1 IMPLANT
GLOVE BIOGEL PI INDICATOR 8 (GLOVE) ×1
GOWN STRL REUS W/ TWL LRG LVL3 (GOWN DISPOSABLE) ×2 IMPLANT
GOWN STRL REUS W/ TWL XL LVL3 (GOWN DISPOSABLE) ×2 IMPLANT
GOWN STRL REUS W/TWL LRG LVL3 (GOWN DISPOSABLE) ×4
GOWN STRL REUS W/TWL XL LVL3 (GOWN DISPOSABLE) ×4
KIT BASIN OR (CUSTOM PROCEDURE TRAY) ×2 IMPLANT
KIT TURNOVER KIT B (KITS) ×2 IMPLANT
NDL 18GX1X1/2 (RX/OR ONLY) (NEEDLE) ×1 IMPLANT
NDL HYPO 25GX1X1/2 BEV (NEEDLE) ×1 IMPLANT
NEEDLE 18GX1X1/2 (RX/OR ONLY) (NEEDLE) ×2 IMPLANT
NEEDLE HYPO 25GX1X1/2 BEV (NEEDLE) ×2 IMPLANT
NS IRRIG 1000ML POUR BTL (IV SOLUTION) ×2 IMPLANT
PACK SURGICAL SETUP 50X90 (CUSTOM PROCEDURE TRAY) ×2 IMPLANT
PAD ARMBOARD 7.5X6 YLW CONV (MISCELLANEOUS) ×4 IMPLANT
SET MICROPUNCTURE 5F STIFF (MISCELLANEOUS) IMPLANT
SOAP 2 % CHG 4 OZ (WOUND CARE) ×2 IMPLANT
SUT ETHILON 3 0 PS 1 (SUTURE) ×2 IMPLANT
SUT MNCRL AB 4-0 PS2 18 (SUTURE) ×2 IMPLANT
SYR 10ML LL (SYRINGE) ×2 IMPLANT
SYR 20ML LL LF (SYRINGE) ×4 IMPLANT
SYR 5ML LL (SYRINGE) ×2 IMPLANT
SYR CONTROL 10ML LL (SYRINGE) ×2 IMPLANT
TOWEL GREEN STERILE (TOWEL DISPOSABLE) ×2 IMPLANT
TOWEL GREEN STERILE FF (TOWEL DISPOSABLE) ×2 IMPLANT
WATER STERILE IRR 1000ML POUR (IV SOLUTION) ×2 IMPLANT
WIRE AMPLATZ SS-J .035X180CM (WIRE) IMPLANT

## 2019-01-26 NOTE — Progress Notes (Signed)
[facility]  Subjective  - no complaints.   Objective 140/76 69 98.6 F (37 C) (Oral) 16 96%  Intake/Output Summary (Last 24 hours) at 01/26/2019 1152 Last data filed at 01/26/2019 0840 Gross per 24 hour  Intake 985.85 ml  Output 940 ml  Net 45.85 ml    Left brachiocephalic AVF with excellent thrill No chest wall implants  Laboratory Lab Results: Recent Labs    01/25/19 0323 01/26/19 0216  WBC 14.4* 12.3*  HGB 12.5 11.4*  HCT 38.8 37.2  PLT 309 283   BMET Recent Labs    01/25/19 0323 01/26/19 0216  NA 138 135  K 2.7* 3.3*  CL 90* 92*  CO2 33* 29  GLUCOSE 181* 304*  BUN 127* 116*  CREATININE 4.55* 4.12*  CALCIUM 8.0* 8.1*    COAG Lab Results  Component Value Date   INR 1.1 11/17/2018   No results found for: PTT  Assessment/Planning:  Plan for [facility] placement, likely IJ approach.  Discussed risks and benefits with patient.  Left brachiocephalic AVF with great thrill.   Marty Heck 01/26/2019 11:52 AM --

## 2019-01-26 NOTE — Progress Notes (Signed)
[place] for Heparin Indication: atrial fibrillation  Allergies  Allergen Reactions  . Eggs Or Egg-Derived Products Nausea And Vomiting  . Lisinopril Nausea And Vomiting  . Penicillins Nausea And Vomiting    Has patient had a PCN reaction causing immediate rash, facial/tongue/throat swelling, SOB or lightheadedness with hypotension: No Has patient had a PCN reaction causing severe rash involving mucus membranes or skin necrosis: No Has patient had a PCN reaction that required hospitalization: No Has patient had a PCN reaction occurring within the last 10 years: No If all of the above answers are "NO", then may proceed with Cephalosporin use.     Patient Measurements: Height: 5\' 3"  (160 cm) Weight: 210 lb 12.2 oz (95.6 kg) IBW/kg (Calculated) : 52.4 Heparin Dosing Weight: 74.4kg  Vital Signs: Temp: 99.1 F (37.3 C) (12/03 2101) Temp Source: Oral (12/03 2101) BP: 105/58 (12/03 2101) Pulse Rate: 73 (12/03 2101)  Labs: Recent Labs    01/23/19 2157 01/24/19 0111 01/24/19 0820 01/24/19 0922 01/24/19 1729  01/25/19 0323 01/25/19 0808 01/25/19 1715 01/26/19 0216  HGB 12.7  --   --  13.7  --   --  12.5  --   --  11.4*  HCT 39.7  --   --  43.3  --   --  38.8  --   --  37.2  PLT 321  --   --  351  --   --  309  --   --  283  APTT  --   --   --   --   --    < >  --  107* 122* 85*  HEPARINUNFRC  --   --   --   --   --    < >  --  0.90* 1.09* 0.58  CREATININE 4.82*  --  4.69*  --   --   --  4.55*  --   --  4.12*  CKTOTAL  --   --   --   --  100  --   --   --   --   --   TROPONINIHS 38* 48*  --  59*  51*  --   --   --   --   --   --    < > = values in this interval not displayed.    Estimated Creatinine Clearance: 12.8 mL/min (A) (by C-G formula based on SCr of 4.12 mg/dL (H)).  Assessment: 78 y.o. female with h/o Afib, Xarelto on hold, for heparin  Goal of Therapy:  Heparin level 0.3-0.7 units/ml aPTT 66-102 seconds Monitor  platelets by anticoagulation protocol: Yes   Plan:  Continue Heparin at current rate   Phillis Knack, PharmD, BCPS  01/26/2019, 3:50 AM

## 2019-01-26 NOTE — Progress Notes (Signed)
Patient ID: Stephanie Woodward, female   DOB: 1940-10-13, 78 y.o.   MRN: [id number] [facility] Progress Note   Assessment/ Plan:   1. 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  She has an immature left brachiocephalic fistula that appears to be developing well and today underwent right IJ tunneled hemodialysis catheter placement in anticipation to start dialysis.  We will try and time this for later today or tomorrow.  We will also begin the process for outpatient dialysis unit placement is available. 2.  Hypokalemia: Secondary to GI losses and poor intake, monitor with hemodialysis. 3.  Hypertension: Blood pressures currently under good control, will continue to follow with hemodialysis. 4.  Moderate to severe pulmonary hypertension: With history of combined systolic and diastolic congestive heart failure-we will continue to follow on hemodialysis with ultrafiltration/volume management. 5.  Paroxysmal atrial fibrillation  Subjective:   Reports to be feeling fair, TDC placed earlier today.   Objective:   BP (!) 116/44 (BP Location: Right Arm)   Pulse (!) 59   Temp 98.3 F (36.8 C) (Oral)   Resp 18   Ht 5\' 3"  (1.6 m)   Wt 95.6 kg   SpO2 93%   BMI 37.33 kg/m   Intake/Output Summary (Last 24 hours) at 01/26/2019 1711 Last data filed at 01/26/2019 1400 Gross per 24 hour  Intake 1055.85 ml  Output 941 ml  Net 114.85 ml   Weight change:   Physical Exam: Gen: Comfortably sitting up on the edge of her bed, daughter at her side CVS: Pulse regular bradycardia, S1 and S2 normal Resp: Clear to auscultation, no rales/rhonchi Abd: Soft, flat, nontender Ext: Nonpitting lower extremity edema, left upper arm BCF with palpable thrill  Imaging: Dg Chest Port 1 View  Result Date: 01/26/2019 CLINICAL DATA:  Dialysis catheter insertion. EXAM: PORTABLE CHEST 1 VIEW  COMPARISON:  January 23, 2019. FINDINGS: Stable cardiomegaly. Interval placement of right internal jugular catheter with distal tip in expected position of SVC. No pneumothorax or pleural effusion is noted. Stable minimal lingular subsegmental atelectasis or scarring. Bony thorax is unremarkable. IMPRESSION: Interval placement of right internal jugular dialysis catheter. No pneumothorax is noted. Stable minimal lingular subsegmental atelectasis or scarring. Electronically Signed   By: Marijo Conception M.D.   On: 01/26/2019 13:43   Dg Foot Complete Left  Result Date: 01/26/2019 CLINICAL DATA:  Acute left foot pain and swelling. EXAM: LEFT FOOT - COMPLETE 3+ VIEW COMPARISON:  None. FINDINGS: There is no evidence of fracture or dislocation. There is no evidence of arthropathy or other focal bone abnormality. Soft tissues are unremarkable. IMPRESSION: Negative. Electronically Signed   By: Marijo Conception M.D.   On: 01/26/2019 10:47   Hybrid Or Imaging (mc Only)  Result Date: 01/26/2019 There is no interpretation for this exam.  This order is for images obtained during a surgical procedure.  Please See "Surgeries" Tab for more information regarding the procedure.    Labs: BMET Recent Labs  Lab 01/23/19 1822 01/23/19 2157 01/24/19 0820 01/25/19 0323 01/26/19 0216  NA 137 133* 136 138 135  K 2.9* 3.0* 4.2 2.7* 3.3*  CL 87* 84* 86* 90* 92*  CO2 33* 33* 33* 33* 29  GLUCOSE 192* 486* 174* 181* 304*  BUN 127* 130* 129* 127* 116*  CREATININE 4.76* 4.82* 4.69* 4.55* 4.12*  CALCIUM 8.2* 8.0* 8.4* 8.0* 8.1*  PHOS  --   --   --  4.3  --    CBC Recent Labs  Lab 01/23/19 2157 01/24/19 0922 01/25/19 0323 01/26/19 0216  WBC 14.9* 14.2* 14.4* 12.3*  NEUTROABS  --  11.3*  --   --   HGB 12.7 13.7 12.5 11.4*  HCT 39.7 43.3 38.8 37.2  MCV 86.3 85.7 84.2 87.3  PLT 321 351 309 283    Medications:    . atorvastatin  80 mg Oral QPM  . calcitRIOL  0.25 mcg Oral Daily  . Chlorhexidine Gluconate  Cloth  6 each Topical Daily  . hydrALAZINE  100 mg Oral TID  . insulin aspart  0-6 Units Subcutaneous TID WC  . insulin glargine  25 Units Subcutaneous Daily  . isosorbide mononitrate  30 mg Oral Daily  . latanoprost  1 drop Both Eyes QHS  . metoprolol succinate  25 mg Oral QHS  . metoprolol succinate  50 mg Oral Daily  . sodium chloride flush  3 mL Intravenous Once  . sodium chloride flush  3 mL Intravenous Q12H   Elmarie Shiley, MD 01/26/2019, 5:11 PM

## 2019-01-26 NOTE — Anesthesia Preprocedure Evaluation (Addendum)
Anesthesia Evaluation  Patient identified by MRN, date of birth, ID band Patient awake    Reviewed: Allergy & Precautions, NPO status , Patient's Chart, lab work & pertinent test results, reviewed documented beta blocker date and time   History of Anesthesia Complications Negative for: history of anesthetic complications  Airway Mallampati: II  TM Distance: >3 FB Neck ROM: Full    Dental  (+) Dental Advisory Given, Edentulous Upper, Edentulous Lower   Pulmonary asthma , sleep apnea , COPD, former smoker,    Pulmonary exam normal breath sounds clear to auscultation       Cardiovascular hypertension, Pt. on medications and Pt. on home beta blockers +CHF  Normal cardiovascular exam+ dysrhythmias Atrial Fibrillation  Rhythm:Regular Rate:Normal     Neuro/Psych negative neurological ROS     GI/Hepatic Neg liver ROS, GERD  Medicated,  Endo/Other  diabetes, Type 2, Insulin DependentObesity   Renal/GU Renal disease     Musculoskeletal negative musculoskeletal ROS (+)   Abdominal   Peds  Hematology  (+) Blood dyscrasia (Xarelto), ,   Anesthesia Other Findings Day of surgery medications reviewed with the patient.  Reproductive/Obstetrics                            Anesthesia Physical  Anesthesia Plan  ASA: IV  Anesthesia Plan: MAC   Post-op Pain Management:    Induction: Intravenous  PONV Risk Score and Plan: 2 and Propofol infusion and Treatment may vary due to age or medical condition  Airway Management Planned: Nasal Cannula and Natural Airway  Additional Equipment:   Intra-op Plan:   Post-operative Plan:   Informed Consent: I have reviewed the patients History and Physical, chart, labs and discussed the procedure including the risks, benefits and alternatives for the proposed anesthesia with the patient or authorized representative who has indicated his/her understanding and  acceptance.     Dental advisory given  Plan Discussed with: CRNA and Anesthesiologist  Anesthesia Plan Comments: (History of paroxysmal afib, pulm htn, HFpEF: LHC in 2015 showed nonobstructive CAD and elevated filling pressures with pulmonary venous hypertension.  [place] 11/23/17 showed significantly elevated right and left heart filling pressures and severe primarily pulmonary venous hypertension. Cardiac output preserved. Echo (10/19) with EF 45-50%, septal-lateral dyssynchrony, RV looked ok.   Last seen by Darrick Grinder, NP in HF clinic 11/07/18, noted pt had upcoming fistula surgery, stated pt's volume was stable and to continue current diuretic regimen.   CKD IV followed by Dr. Moshe Cipro.   TTE 11/23/17: - Normal LV size with mild LV hypertrophy. EF 45-50%, mild diffuse   hypokinesis with septal-lateral dyssynchrony. Normal RV size and   systolic function. No significant valvular abnormalities.)       Anesthesia Quick Evaluation

## 2019-01-26 NOTE — Anesthesia Postprocedure Evaluation (Signed)
Anesthesia Post Note  Patient: Stephanie Woodward  Procedure(s) Performed: INSERTION OF DIALYSIS CATHETER, right internal jugular (Right Neck)     Patient location during evaluation: PACU Anesthesia Type: MAC Level of consciousness: awake and alert Pain management: pain level controlled Vital Signs Assessment: post-procedure vital signs reviewed and stable Respiratory status: spontaneous breathing and respiratory function stable Cardiovascular status: stable Postop Assessment: no apparent nausea or vomiting Anesthetic complications: no    Last Vitals:  Vitals:   01/26/19 1408 01/26/19 1409  BP: (!) 157/68   Pulse: (!) 59 75  Resp: (!) 25 (!) 21  Temp: 36.9 C   SpO2: 95% 95%    Last Pain:  Vitals:   01/26/19 1405  TempSrc:   PainSc: 0-No pain                 Richar Dunklee DANIEL

## 2019-01-26 NOTE — Patient Outreach (Signed)
  [facility]) Care Management Chronic Special Needs Program    01/26/2019  Name: Stephanie Woodward, DOB: 01/04/1941  MRN: [id number]   [name] is enrolled in a chronic special needs plan for Diabetes.Client new to CSNP program less than 90 days. No Health risk assessment completed. Client is not currently engaged with RNCM. Client admitted to [facility] on 01/23/19 for Acute renal failure on chronic kidney disease stage IV.  RNCM completed individualized care plan based on available data. Client is actively engaged with [facility].   PLAN; RNCM will send individualized care plan to hospital utilization management team. RNCM will care coordinate with hospital liaison and [facility] as indicated.  RNCM will follow up with patient post discharge.   Quinn Plowman RN,BSN,CCM [facility] [phone]

## 2019-01-26 NOTE — Op Note (Signed)
01/26/2019  PREOP DIAGNOSIS: Chronic kidney disease  POSTOP DIAGNOSIS: Chronic kidney disease  PROCEDURE: Ultrasound guided placement of right  IJ tunneled dialysis catheter  (23 cm)  SURGEON: Judeth Cornfield. Scot Dock, MD, FACS  ASSIST: none  ANESTHESIA: local with sedation   EBL: minimal  FINDINGS: patent right IJ  INDICATIONS: for HD  TECHNIQUE: The patient was taken to the operating room and sedated by anesthesia. The neck and upper chest were prepped and draped in the usual sterile fashion. After the skin was anesthetized with 1% lidocaine, and under ultrasound guidance, the right IJ was cannulated and a guidewire introduced into the superior vena cava under fluoroscopic control. The tract over the wire was dilated and then the dilator and peel-away sheath were passed over the wire and the wire and dilator removed. The catheter was passed through the peel-away sheath and positioned in the right atrium. The exit site for the catheter was selected and the skin anesthetized between the 2 areas. The catheter was then brought through the tunnel, cut to the appropriate length, and the distal ports were attached. Both ports withdrew easily, were then flushed with heparinized saline and filled with concentrated heparin. The catheter was secured at its exit site with a 3-0 nylon suture. The IJ cannulation site was closed with a 4-0 subcuticular stitch. A sterile dressing was applied. The patient tolerated the procedure well and was transferred to the recovery room in stable condition. All needle and sponge counts were correct.  [name], MD, FACS [facility]: 01/26/2019 DATE OF DICTATION: 01/26/2019

## 2019-01-26 NOTE — Anesthesia Procedure Notes (Signed)
Date/Time: 01/26/2019 12:50 PM Performed by: Trinna Post., CRNA Pre-anesthesia Checklist: Patient identified, Emergency Drugs available, Suction available, Patient being monitored and Timeout performed Patient Re-evaluated:Patient Re-evaluated prior to induction Oxygen Delivery Method: Simple face mask Preoxygenation: Pre-oxygenation with 100% oxygen Induction Type: IV induction Ventilation: Oral airway inserted - appropriate to patient size Placement Confirmation: positive ETCO2

## 2019-01-26 NOTE — Progress Notes (Signed)
[place] for Heparin Indication: atrial fibrillation  Allergies  Allergen Reactions  . Eggs Or Egg-Derived Products Nausea And Vomiting  . Lisinopril Nausea And Vomiting  . Penicillins Nausea And Vomiting    Has patient had a PCN reaction causing immediate rash, facial/tongue/throat swelling, SOB or lightheadedness with hypotension: No Has patient had a PCN reaction causing severe rash involving mucus membranes or skin necrosis: No Has patient had a PCN reaction that required hospitalization: No Has patient had a PCN reaction occurring within the last 10 years: No If all of the above answers are "NO", then may proceed with Cephalosporin use.     Patient Measurements: Height: 5\' 3"  (160 cm) Weight: 210 lb 12.2 oz (95.6 kg) IBW/kg (Calculated) : 52.4 Heparin Dosing Weight: 74.4kg  Vital Signs: Temp: 98.6 F (37 C) (12/04 0915) Temp Source: Oral (12/04 0915) BP: 140/76 (12/04 1138) Pulse Rate: 69 (12/04 1138)  Labs: Recent Labs    01/23/19 2157 01/24/19 0111 01/24/19 0820 01/24/19 0922 01/24/19 1729  01/25/19 0323 01/25/19 0808 01/25/19 1715 01/26/19 0216 01/26/19 1012  HGB 12.7  --   --  13.7  --   --  12.5  --   --  11.4*  --   HCT 39.7  --   --  43.3  --   --  38.8  --   --  37.2  --   PLT 321  --   --  351  --   --  309  --   --  283  --   APTT  --   --   --   --   --    < >  --  107* 122* 85*  --   HEPARINUNFRC  --   --   --   --   --    < >  --  0.90* 1.09* 0.58 0.49  CREATININE 4.82*  --  4.69*  --   --   --  4.55*  --   --  4.12*  --   CKTOTAL  --   --   --   --  100  --   --   --   --   --   --   TROPONINIHS 38* 48*  --  59*  51*  --   --   --   --   --   --   --    < > = values in this interval not displayed.    Estimated Creatinine Clearance: 12.8 mL/min (A) (by C-G formula based on SCr of 4.12 mg/dL (H)).  Assessment: 78 y.o. female with h/o Afib, Xarelto on hold, for heparin  Confirmatory heparin level 0.49.  Heparin now on hold for OR. Will need to f/u with surgery to determine plan for heparin post-op.   Goal of Therapy:  Heparin level 0.3-0.7 units/ml aPTT 66-102 seconds Monitor platelets by anticoagulation protocol: Yes   Plan:  Continue Heparin at current rate of 900 units/hr once OK to restart with surgery service.   Natalyah Cummiskey A. Levada Dy, PharmD, BCPS, FNKF Clinical Pharmacist [place] Please utilize Amion for appropriate phone number to reach the unit pharmacist ([place])   01/26/2019, 11:52 AM

## 2019-01-26 NOTE — Progress Notes (Signed)
Inpatient Diabetes Program Recommendations  AACE/ADA: New Consensus Statement on Inpatient Glycemic Control (2015)  Target Ranges:  Prepandial:   less than 140 mg/dL      Peak postprandial:   less than 180 mg/dL (1-2 hours)      Critically ill patients:  140 - 180 mg/dL   Lab Results  Component Value Date   GLUCAP 97 01/26/2019   HGBA1C 8.9 (H) 01/24/2019    Review of Glycemic Control Results for RIDA, LOUDIN (MRN [id number]) as of 01/26/2019 12:45  Ref. Range 01/25/2019 11:42 01/25/2019 16:32 01/25/2019 20:59 01/26/2019 06:53 01/26/2019 11:04  Glucose-Capillary Latest Ref Range: 70 - 99 mg/dL 151 (H) 125 (H) 163 (H) 166 (H) 97   Diabetes history: DM 2 Outpatient Diabetes medications:  Tresiba 25 units daily, Novolog 10 units daily with lunch Current orders for Inpatient glycemic control:  Lantus 25 units daily, Novolog 0-6 units tid with meals  Inpatient Diabetes Program Recommendations:   Blood sugars controlled.  No recommendations at this time.   Thanks  Adah Perl, RN, BC-ADM Inpatient Diabetes Coordinator Pager [phone] (8a-5p)

## 2019-01-26 NOTE — Progress Notes (Signed)
PROGRESS NOTE    Stephanie Woodward  FBP:[id number] DOB: 06/02/1940 DOA: 01/23/2019 PCP: Alroy Dust, L.Marlou Sa, MD     Brief Narrative:  Stephanie Woodward is a 78 y.o. female with medical history significant of HTN, HLD, CHF, IDDM, solitary kidney, chronic kidney disease stage IV followed by Dr. Clover Mealy, chronic atrial fibrillation on Xarelto.  She presents with complaints of nausea and vomiting.  Patient reports being nauseated for quite some time, but started having intermittent vomiting 4 days ago.  Emesis is noted to be nonbloody and nonbilious and appears.  Associated symptoms include lower abdominal cramping and worsening memory.  Daughter notes that the patient diabetes has been wildly uncontrolled with blood sugars anywhere from 42->400.  She was finally able to get set up with the endocrinologist and has an appointment sometime next week.  She was seen by her nephrologist 3-4 weeks ago and started on metolazone.  Patient had a fistula placed at the end of September 2020 by Dr. Carlis Abbott vascular surgery, but it hasn't fully matured. Nephrology was consulted.   New events last 24 hours / Subjective: Feeling better, denies any nausea this morning.  Complains of some intermittent left dorsal foot pain that started about a week ago.  Having trouble bearing any weight on that foot.  Assessment & Plan:   Principal Problem:   Acute renal failure superimposed on chronic kidney disease ([place]) Active Problems:   CHF (congestive heart failure) (HCC)   Essential hypertension   Diabetes mellitus with complication (HCC)   Leukocytosis   Hypokalemia   Atrial fibrillation, chronic (HCC)   Chronic anticoagulation   AKI on CKD stage IV -At baseline patient's creatinine previously had been around 2.  Patient presents with creatinine elevated up to 4.82 with a BUN 130.  Suspect prerenal cause of as patient had recently started diuretics and has also had N/V, but there is concern for patient becoming uremic  -Nephrology following -Plan for tunneled dialysis catheter placement today  Nausea and vomiting -Appears likely multifactorial given worsening kidney function with BUN elevated  -Does not seem to be infectious in etiology -Supportive care  -Improved  Prolonged QT interval -QTc on admission noted to be 510 -Correct electrolyte abnormalities -Avoid QT prolonging medication -Recheck EKG   Persistent atrial fibrillation on chronic anticoagulation -Held Xarelto due to kidney function -Heparin drip per pharmacy for now  Chronic combined systolic and diastolic CHF -Last EF noted to be 45 to 50% with grade 2 diastolic dysfunction in 82/4235  Patient does not clinically appear fluid overloaded at this time  Essential hypertension -Continue metoprolol, hydralazine  CAD -Continue imdur   Diabetes mellitus type 2 with hyperglycemia -Hemoglobin A1c -Lantus, novolog SSI -Diabetic coordinator consult  -Avoid hypoglycemia   Hyperlipidemia -Continue atorvastatin  GERD -Hold Protonix due to prolonged QT  Left foot pain -Negative for fracture or dislocation -Suspect tendinitis, supportive care   DVT prophylaxis: IV heparin (on Xarelto PTA) Code Status: Full Family Communication: None at bedside Disposition Plan: Tunneled dialysis catheter placement planned for today   Consultants:   Nephrology  Vascular surgery  Procedures:   None   Antimicrobials:  Anti-infectives (From admission, onward)   None       Objective: Vitals:   01/25/19 2101 01/26/19 0455 01/26/19 0915 01/26/19 1138  BP: (!) 105/58 (!) 116/44 (!) 119/46 140/76  Pulse: 73 63 (!) 58 69  Resp: 18 18 18 16   Temp: 99.1 F (37.3 C) 98.8 F (37.1 C) 98.6 F (37 C)   TempSrc: Oral  Oral Oral   SpO2: 95% 94% 95% 96%  Weight:      Height:        Intake/Output Summary (Last 24 hours) at 01/26/2019 1310 Last data filed at 01/26/2019 1308 Gross per 24 hour  Intake 865.85 ml  Output 941 ml   Net -75.15 ml   Filed Weights   01/24/19 1415 01/24/19 2311  Weight: 95.3 kg 95.6 kg    Examination: General exam: Appears calm and comfortable  Respiratory system: Clear to auscultation. Respiratory effort normal. Cardiovascular system: S1 & S2 heard, RRR. No pedal edema. Gastrointestinal system: Abdomen is nondistended, soft and nontender. Normal bowel sounds heard. Central nervous system: Alert and oriented. Non focal exam. Speech clear  Extremities: Symmetric in appearance bilaterally, point tenderness to palpation left dorsal foot over first metatarsal Skin: No rashes, lesions or ulcers on exposed skin  Psychiatry: Judgement and insight appear stable. Mood & affect appropriate.    Data Reviewed: I have personally reviewed following labs and imaging studies  CBC: Recent Labs  Lab 01/23/19 2157 01/24/19 0922 01/25/19 0323 01/26/19 0216  WBC 14.9* 14.2* 14.4* 12.3*  NEUTROABS  --  11.3*  --   --   HGB 12.7 13.7 12.5 11.4*  HCT 39.7 43.3 38.8 37.2  MCV 86.3 85.7 84.2 87.3  PLT 321 351 309 509   Basic Metabolic Panel: Recent Labs  Lab 01/23/19 1822 01/23/19 2157 01/24/19 0820 01/24/19 0922 01/25/19 0323 01/25/19 1201 01/26/19 0216  NA 137 133* 136  --  138  --  135  K 2.9* 3.0* 4.2  --  2.7*  --  3.3*  CL 87* 84* 86*  --  90*  --  92*  CO2 33* 33* 33*  --  33*  --  29  GLUCOSE 192* 486* 174*  --  181*  --  304*  BUN 127* 130* 129*  --  127*  --  116*  CREATININE 4.76* 4.82* 4.69*  --  4.55*  --  4.12*  CALCIUM 8.2* 8.0* 8.4*  --  8.0*  --  8.1*  MG  --   --   --  1.9  --  1.7 1.8  PHOS  --   --   --   --  4.3  --   --    GFR: Estimated Creatinine Clearance: 12.8 mL/min (A) (by C-G formula based on SCr of 4.12 mg/dL (H)). Liver Function Tests: Recent Labs  Lab 01/24/19 0820 01/25/19 0323  AST 26  --   ALT 11  --   ALKPHOS 101  --   BILITOT 1.1  --   PROT 7.2  --   ALBUMIN 3.1* 2.6*   No results for input(s): LIPASE, AMYLASE in the last 168 hours.  No results for input(s): AMMONIA in the last 168 hours. Coagulation Profile: No results for input(s): INR, PROTIME in the last 168 hours. Cardiac Enzymes: Recent Labs  Lab 01/24/19 1729  CKTOTAL 100   BNP (last 3 results) No results for input(s): PROBNP in the last 8760 hours. HbA1C: Recent Labs    01/24/19 1729  HGBA1C 8.9*   CBG: Recent Labs  Lab 01/25/19 1142 01/25/19 1632 01/25/19 2059 01/26/19 0653 01/26/19 1104  GLUCAP 151* 125* 163* 166* 97   Lipid Profile: No results for input(s): CHOL, HDL, LDLCALC, TRIG, CHOLHDL, LDLDIRECT in the last 72 hours. Thyroid Function Tests: No results for input(s): TSH, T4TOTAL, FREET4, T3FREE, THYROIDAB in the last 72 hours. Anemia Panel: No results for input(s): VITAMINB12,  FOLATE, FERRITIN, TIBC, IRON, RETICCTPCT in the last 72 hours. Sepsis Labs: No results for input(s): PROCALCITON, LATICACIDVEN in the last 168 hours.  Recent Results (from the past 240 hour(s))  Novel Coronavirus, NAA (Hosp order, Send-out to Ref Lab; TAT 18-24 hrs     Status: None   Collection Time: 01/23/19  5:38 PM   Specimen: Nasopharyngeal Swab; Respiratory  Result Value Ref Range Status   SARS-CoV-2, NAA NOT DETECTED NOT DETECTED Final    Comment: (NOTE) This nucleic acid amplification test was developed and its performance characteristics determined by [employer]. Nucleic acid amplification tests include PCR and TMA. This test has not been FDA cleared or approved. This test has been authorized by FDA under an Emergency Use Authorization (EUA). This test is only authorized for the duration of time the declaration that circumstances exist justifying the authorization of the emergency use of in vitro diagnostic tests for detection of SARS-CoV-2 virus and/or diagnosis of COVID-19 infection under section 564(b)(1) of the Act, 21 U.S.C. 619JKD-3(O) (1), unless the authorization is terminated or revoked sooner. When diagnostic testing is  negative, the possibility of a false negative result should be considered in the context of a patient's recent exposures and the presence of clinical signs and symptoms consistent with COVID-19. An individual without symptoms of COVID- 19 and who is not shedding SARS-CoV-2 vi rus would expect to have a negative (not detected) result in this assay. Performed At: [facility] [address] [place], [place] [id number] Rush Farmer MD XI:[id number]    [place]  Final    Comment: Performed at [facility], [place] [address] [place], [place] [address]  SARS CORONAVIRUS 2 (TAT 6-24 HRS) Nasopharyngeal Nasopharyngeal Swab     Status: None   Collection Time: 01/24/19 12:19 PM   Specimen: Nasopharyngeal Swab  Result Value Ref Range Status   SARS Coronavirus 2 NEGATIVE NEGATIVE Final    Comment: (NOTE) SARS-CoV-2 target nucleic acids are NOT DETECTED. The SARS-CoV-2 RNA is generally detectable in upper and lower respiratory specimens during the acute phase of infection. Negative results do not preclude SARS-CoV-2 infection, do not rule out co-infections with other pathogens, and should not be used as the sole basis for treatment or other patient management decisions. Negative results must be combined with clinical observations, patient history, and epidemiological information. The expected result is Negative. Fact Sheet for Patients: SugarRoll.be Fact Sheet for Healthcare Providers: https://www.woods-mathews.com/ This test is not yet approved or cleared by the [place] FDA and  has been authorized for detection and/or diagnosis of SARS-CoV-2 by FDA under an Emergency Use Authorization (EUA). This EUA will remain  in effect (meaning this test can be used) for the duration of the COVID-19 declaration under Section 56 4(b)(1) of the Act, 21 U.S.C. section 360bbb-3(b)(1), unless the authorization is terminated  or revoked sooner. Performed at [facility], [place] [address] [place],  [address]   Surgical PCR screen     Status: None   Collection Time: 01/25/19  5:03 PM   Specimen: Nasal Mucosa; Nasal Swab  Result Value Ref Range Status   MRSA, PCR NEGATIVE NEGATIVE Final   Staphylococcus aureus NEGATIVE NEGATIVE Final    Comment: (NOTE) The Xpert SA Assay (FDA approved for NASAL specimens in patients 64 years of age and older), is one component of a comprehensive surveillance program. It is not intended to diagnose infection nor to guide or monitor treatment. Performed at [facility], [place]  [address] [place], [place] [address]       Radiology Studies: Dg Foot Complete Left  Result Date: 01/26/2019 CLINICAL DATA:  Acute left foot pain and swelling. EXAM: LEFT FOOT - COMPLETE 3+ VIEW COMPARISON:  None. FINDINGS: There is no evidence of fracture or dislocation. There is no evidence of arthropathy or other focal bone abnormality. Soft tissues are unremarkable. IMPRESSION: Negative. Electronically Signed   By: Marijo Conception M.D.   On: 01/26/2019 10:47   Hybrid Or Imaging (mc Only)  Result Date: 01/26/2019 There is no interpretation for this exam.  This order is for images obtained during a surgical procedure.  Please See "Surgeries" Tab for more information regarding the procedure.      Scheduled Meds: . [MAR Hold] atorvastatin  80 mg Oral QPM  . [MAR Hold] calcitRIOL  0.25 mcg Oral Daily  . [MAR Hold] hydrALAZINE  100 mg Oral TID  . [MAR Hold] insulin aspart  0-6 Units Subcutaneous TID WC  . [MAR Hold] insulin glargine  25 Units Subcutaneous Daily  . [MAR Hold] isosorbide mononitrate  30 mg Oral Daily  . [MAR Hold] latanoprost  1 drop Both Eyes QHS  . [MAR Hold] metoprolol succinate  25 mg Oral QHS  . [MAR Hold] metoprolol succinate  50 mg Oral Daily  . [MAR Hold] sodium chloride flush  3 mL Intravenous Once  . [MAR Hold] sodium chloride flush  3 mL Intravenous  Q12H   Continuous Infusions: . sodium chloride Stopped (01/26/19 1110)  . sodium chloride 10 mL/hr at 01/26/19 1138  . heparin Stopped (01/26/19 1114)     LOS: 2 days      Time spent: 25 minutes   Dessa Phi, DO [facility] 01/26/2019, 1:10 PM   Available via Epic secure chat 7am-7pm After these hours, please refer to coverage provider listed on [email or web address]

## 2019-01-26 NOTE — Consult Note (Signed)
   [facility] CM Inpatient Consult   01/26/2019  Stephanie Woodward April 28, 1940 [id number]   HTA- CSNP (Chronic special needs patient):  Patient is currently made active with [facility] for chronic disease management services. Patient is currently assigned to a [facility] coordinator for chronic disease management (diabetes/HF).  Our community based plan of care has focused on disease managementand community resource support.   Per chart review and MD brief narrative dated 01/25/19 reveal as follows: Stephanie Woodward is a 78 y.o.femalewith medical history significant ofHTN, HLD,CHF, IDDM, solitary kidney, chronic kidney disease stage IV followed byDr.[place], chronic atrial fibrillation on Xarelto, presents with complaints of nausea and vomiting.  Patient's diabetes has been wildly uncontrolled with blood sugars anywhere from 42->400. She was finally able to get set up with the endocrinologist and has an appointment sometime next week.[AKI on CKD stage IV, Diabetes mellitus type 2with hyperglycemia,Persistent atrial fibrillation]  Patient will receive a post hospital call and will be evaluated for assessments and disease process education, if transitioning to home.  Plan:Will follow-up disposition/ needs withInpatient TOC team member and to make aware that [facility] following.   Will notify [facility] RN Chronic care management coordinator of patient's admission and disposition.  Of note, [facility] Care Management services does not replace or interfere with any services that are needed or arranged by inpatient [facility] care management team.  Outreached with TOC CM and current plan is to discharge home with husband. Patient starting HD this admit.   For additional information and questions, please contact:  Abdulaziz Toman A. Delorean Knutzen, BSN, RN-BC [facility] Liaison Cell: [phone]

## 2019-01-26 NOTE — Transfer of Care (Signed)
Immediate Anesthesia Transfer of Care Note  Patient: Jacki Cones  Procedure(s) Performed: INSERTION OF DIALYSIS CATHETER, left internal jugular (Left Neck)  Patient Location: PACU  Anesthesia Type:MAC  Level of Consciousness: alert   Airway & Oxygen Therapy: Patient Spontanous Breathing and Patient connected to face mask oxygen  Post-op Assessment: Report given to RN and Post -op Vital signs reviewed and stable  Post vital signs: Reviewed and stable  Last Vitals:  Vitals Value Taken Time  BP 158/106 01/26/19 1327  Temp 36.8 C 01/26/19 1327  Pulse 79 01/26/19 1329  Resp 18 01/26/19 1329  SpO2 99 % 01/26/19 1329  Vitals shown include unvalidated device data.  Last Pain:  Vitals:   01/26/19 1138  TempSrc:   PainSc: 0-No pain      Patients Stated Pain Goal: 0 (43/15/40 0867)  Complications: No apparent anesthesia complications

## 2019-01-27 ENCOUNTER — Encounter (HOSPITAL_COMMUNITY): Payer: Self-pay | Admitting: Vascular Surgery

## 2019-01-27 LAB — BASIC METABOLIC PANEL
Anion gap: 14 (ref 5–15)
BUN: 107 mg/dL — ABNORMAL HIGH (ref 8–23)
CO2: 30 mmol/L (ref 22–32)
Calcium: 8.3 mg/dL — ABNORMAL LOW (ref 8.9–10.3)
Chloride: 96 mmol/L — ABNORMAL LOW (ref 98–111)
Creatinine, Ser: 3.77 mg/dL — ABNORMAL HIGH (ref 0.44–1.00)
GFR calc Af Amer: 13 mL/min — ABNORMAL LOW (ref >60)
GFR calc non Af Amer: 11 mL/min — ABNORMAL LOW (ref >60)
Glucose, Bld: 296 mg/dL — ABNORMAL HIGH (ref 70–99)
Potassium: 3.7 mmol/L (ref 3.5–5.1)
Sodium: 140 mmol/L (ref 135–145)

## 2019-01-27 LAB — GLUCOSE, CAPILLARY
Glucose-Capillary: 226 mg/dL — ABNORMAL HIGH (ref 70–99)
Glucose-Capillary: 301 mg/dL — ABNORMAL HIGH (ref 70–99)
Glucose-Capillary: 110 mg/dL — ABNORMAL HIGH (ref 70–99)
Glucose-Capillary: 308 mg/dL — ABNORMAL HIGH (ref 70–99)

## 2019-01-27 LAB — HEPARIN LEVEL (UNFRACTIONATED): Heparin Unfractionated: 0.29 IU/mL — ABNORMAL LOW (ref 0.30–0.70)

## 2019-01-27 LAB — CBC
HCT: 35.9 % — ABNORMAL LOW (ref 36.0–46.0)
Hemoglobin: 11.1 g/dL — ABNORMAL LOW (ref 12.0–15.0)
MCH: 27.2 pg (ref 26.0–34.0)
MCHC: 30.9 g/dL (ref 30.0–36.0)
MCV: 88 fL (ref 80.0–100.0)
Platelets: 253 10*3/uL (ref 150–400)
RBC: 4.08 MIL/uL (ref 3.87–5.11)
RDW: 14.6 % (ref 11.5–15.5)
WBC: 12.2 10*3/uL — ABNORMAL HIGH (ref 4.0–10.5)
nRBC: 0 % (ref 0.0–0.2)

## 2019-01-27 LAB — APTT: aPTT: 70 seconds — ABNORMAL HIGH (ref 24–36)

## 2019-01-27 MED ORDER — ALTEPLASE 2 MG IJ SOLR: 2.0000 mg | Freq: Once | INTRAMUSCULAR | Status: DC | PRN

## 2019-01-27 MED ORDER — LIDOCAINE-PRILOCAINE 2.5-2.5 % EX CREA: 1.0000 "application " | TOPICAL_CREAM | CUTANEOUS | Status: DC | PRN

## 2019-01-27 MED ORDER — PENTAFLUOROPROP-TETRAFLUOROETH EX AERO: 1.0000 "application " | INHALATION_SPRAY | CUTANEOUS | Status: DC | PRN

## 2019-01-27 MED ORDER — ONDANSETRON 4 MG PO TBDP
4.0000 mg | ORAL_TABLET | Freq: Once | ORAL | Status: AC
  Administered 2019-01-27: 4 mg via ORAL
  Filled 2019-01-27: qty 1

## 2019-01-27 MED ORDER — HEPARIN SODIUM (PORCINE) 1000 UNIT/ML IJ SOLN
INTRAMUSCULAR | Status: AC
  Administered 2019-01-27: 3400 [IU] via INTRAVENOUS_CENTRAL
  Filled 2019-01-27: qty 4

## 2019-01-27 MED ORDER — SODIUM CHLORIDE 0.9 % IV SOLN: 100.0000 mL | INTRAVENOUS | Status: DC | PRN

## 2019-01-27 MED ORDER — HEPARIN SODIUM (PORCINE) 1000 UNIT/ML DIALYSIS
1000.0000 [IU] | INTRAMUSCULAR | Status: DC | PRN
  Administered 2019-01-27: 16:00:00 3400 [IU] via INTRAVENOUS_CENTRAL
  Filled 2019-01-27: qty 1

## 2019-01-27 MED ORDER — LIDOCAINE HCL (PF) 1 % IJ SOLN: 5.0000 mL | INTRAMUSCULAR | Status: DC | PRN

## 2019-01-27 NOTE — Plan of Care (Signed)
  Problem: Education: Goal: Knowledge of General Education information will improve Description Including pain rating scale, medication(s)/side effects and non-pharmacologic comfort measures Outcome: Progressing   

## 2019-01-27 NOTE — Progress Notes (Signed)
Patient ID: Stephanie Woodward, female   DOB: 02/15/1941, 78 y.o.   MRN: [id number] [facility] Progress Note   Assessment/ Plan:   1. 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  With immature left BCF and now status post right IJ [facility] yesterday by Dr. Scot Dock.  Hemodialysis (first treatment) ordered for today. 2.  Hypokalemia: Secondary to GI losses and poor intake, monitor with hemodialysis. 3.  Hypertension: Blood pressures currently under good control, will continue to follow with hemodialysis. 4.  Moderate to severe pulmonary hypertension: With combined systolic/diastolic CHF and currently appears to be compensated-we will continue to follow with UF on HD 5.  Paroxysmal atrial fibrillation: Appears to be rate controlled at this time  Subjective:   Reports to be feeling fair, TDC placed earlier today.   Objective:   BP (!) 134/49 (BP Location: Right Arm)   Pulse 73   Temp 98.3 F (36.8 C) (Oral)   Resp 18   Ht 5\' 3"  (1.6 m)   Wt 95.6 kg   SpO2 93%   BMI 37.33 kg/m   Intake/Output Summary (Last 24 hours) at 01/27/2019 0955 Last data filed at 01/27/2019 0700 Gross per 24 hour  Intake 1817.95 ml  Output 301 ml  Net 1516.95 ml   Weight change:   Physical Exam: Gen: Sitting up comfortably on the side of her bed CVS: Pulse regular rhythm, normal rate, S1 and S2 normal Resp: Lung fields clear to auscultation without rales or rhonchi.  Right IJ TDC Abd: Soft, flat, nontender Ext: Nonpitting lower extremity edema, left upper arm BCF with palpable thrill  Imaging: Dg Chest Port 1 View  Result Date: 01/26/2019 CLINICAL DATA:  Dialysis catheter insertion. EXAM: PORTABLE CHEST 1 VIEW COMPARISON:  January 23, 2019. FINDINGS: Stable cardiomegaly. Interval placement of right internal jugular catheter with distal tip in expected position of SVC. No pneumothorax or  pleural effusion is noted. Stable minimal lingular subsegmental atelectasis or scarring. Bony thorax is unremarkable. IMPRESSION: Interval placement of right internal jugular dialysis catheter. No pneumothorax is noted. Stable minimal lingular subsegmental atelectasis or scarring. Electronically Signed   By: Marijo Conception M.D.   On: 01/26/2019 13:43   Dg Foot Complete Left  Result Date: 01/26/2019 CLINICAL DATA:  Acute left foot pain and swelling. EXAM: LEFT FOOT - COMPLETE 3+ VIEW COMPARISON:  None. FINDINGS: There is no evidence of fracture or dislocation. There is no evidence of arthropathy or other focal bone abnormality. Soft tissues are unremarkable. IMPRESSION: Negative. Electronically Signed   By: Marijo Conception M.D.   On: 01/26/2019 10:47   Hybrid Or Imaging (mc Only)  Result Date: 01/26/2019 There is no interpretation for this exam.  This order is for images obtained during a surgical procedure.  Please See "Surgeries" Tab for more information regarding the procedure.    Labs: BMET Recent Labs  Lab 01/23/19 1822 01/23/19 2157 01/24/19 0820 01/25/19 0323 01/26/19 0216 01/27/19 0447  NA 137 133* 136 138 135 140  K 2.9* 3.0* 4.2 2.7* 3.3* 3.7  CL 87* 84* 86* 90* 92* 96*  CO2 33* 33* 33* 33* 29 30  GLUCOSE 192* 486* 174* 181* 304* 296*  BUN 127* 130* 129* 127* 116* 107*  CREATININE 4.76* 4.82* 4.69* 4.55* 4.12* 3.77*  CALCIUM 8.2* 8.0* 8.4* 8.0* 8.1* 8.3*  PHOS  --   --   --  4.3  --   --  CBC Recent Labs  Lab 01/24/19 0922 01/25/19 0323 01/26/19 0216 01/27/19 0447  WBC 14.2* 14.4* 12.3* 12.2*  NEUTROABS 11.3*  --   --   --   HGB 13.7 12.5 11.4* 11.1*  HCT 43.3 38.8 37.2 35.9*  MCV 85.7 84.2 87.3 88.0  PLT 351 309 283 253    Medications:    . atorvastatin  80 mg Oral QPM  . calcitRIOL  0.25 mcg Oral Daily  . Chlorhexidine Gluconate Cloth  6 each Topical Daily  . hydrALAZINE  100 mg Oral TID  . insulin aspart  0-6 Units Subcutaneous TID WC  . insulin  glargine  25 Units Subcutaneous Daily  . isosorbide mononitrate  30 mg Oral Daily  . latanoprost  1 drop Both Eyes QHS  . metoprolol succinate  25 mg Oral QHS  . metoprolol succinate  50 mg Oral Daily  . sodium chloride flush  3 mL Intravenous Once  . sodium chloride flush  3 mL Intravenous Q12H   Elmarie Shiley, MD 01/27/2019, 9:55 AM

## 2019-01-27 NOTE — Progress Notes (Signed)
[facility] for Heparin Indication: atrial fibrillation  Allergies  Allergen Reactions  . Eggs Or Egg-Derived Products Nausea And Vomiting  . Lisinopril Nausea And Vomiting  . Penicillins Nausea And Vomiting    Has patient had a PCN reaction causing immediate rash, facial/tongue/throat swelling, SOB or lightheadedness with hypotension: No Has patient had a PCN reaction causing severe rash involving mucus membranes or skin necrosis: No Has patient had a PCN reaction that required hospitalization: No Has patient had a PCN reaction occurring within the last 10 years: No If all of the above answers are "NO", then may proceed with Cephalosporin use.     Patient Measurements: Height: 5\' 3"  (160 cm) Weight: 210 lb 12.2 oz (95.6 kg) IBW/kg (Calculated) : 52.4 Heparin Dosing Weight: 74.4kg  Vital Signs: Temp: 98.3 F (36.8 C) (12/05 0905) Temp Source: Oral (12/05 0905) BP: 134/49 (12/05 0905) Pulse Rate: 73 (12/05 0905)  Labs: Recent Labs    01/24/19 1729  01/25/19 0323  01/25/19 1715 01/26/19 0216 01/26/19 1012 01/27/19 0447  HGB  --    < > 12.5  --   --  11.4*  --  11.1*  HCT  --   --  38.8  --   --  37.2  --  35.9*  PLT  --   --  309  --   --  283  --  253  APTT  --    < >  --    < > 122* 85*  --  70*  HEPARINUNFRC  --    < >  --    < > 1.09* 0.58 0.49 0.29*  CREATININE  --   --  4.55*  --   --  4.12*  --  3.77*  CKTOTAL 100  --   --   --   --   --   --   --    < > = values in this interval not displayed.    Estimated Creatinine Clearance: 14 mL/min (A) (by C-G formula based on SCr of 3.77 mg/dL (H)).  Assessment: 78 y.o. female with h/o Afib, PTA Xarelto on hold, for heparin in the interim.  Pt is s/p VVS placement of TDC for hemodialysis on 12/4.  Heparin level 0.29 on 900 units/hr.  Slightly below goal.  No bleeding noted.  Goal of Therapy:  Heparin level 0.3-0.7 units/ml aPTT 66-102 seconds Monitor platelets by  anticoagulation protocol: Yes   Plan:  Increase heparin to 1000 units/hr Heparin level and CBC daily Follow-up plans to resume oral anticoagulation  [employer], Pharm.D., BCPS Clinical Pharmacist Clinical phone for 01/27/2019 from 8:30-4:00 is [phone].  **Pharmacist phone directory can now be found on [email or web address] ([email or web address]).  Listed under [place].  01/27/2019 9:50 AM

## 2019-01-27 NOTE — Progress Notes (Signed)
PROGRESS NOTE    Stephanie Woodward  TKZ:[id number] DOB: 10/08/1940 DOA: 01/23/2019 PCP: Alroy Dust, L.Marlou Sa, MD     Brief Narrative:  Stephanie Woodward is a 78 y.o. female with medical history significant of HTN, HLD, CHF, IDDM, solitary kidney, chronic kidney disease stage IV followed by Dr. Clover Mealy, chronic atrial fibrillation on Xarelto.  She presents with complaints of nausea and vomiting.  Patient reports being nauseated for quite some time, but started having intermittent vomiting 4 days ago.  Emesis is noted to be nonbloody and nonbilious and appears.  Associated symptoms include lower abdominal cramping and worsening memory.  Daughter notes that the patient diabetes has been wildly uncontrolled with blood sugars anywhere from 42->400.  She was finally able to get set up with the endocrinologist and has an appointment sometime next week.  She was seen by her nephrologist 3-4 weeks ago and started on metolazone.  Patient had a fistula placed at the end of September 2020 by Dr. Carlis Abbott vascular surgery, but it hasn't fully matured. Nephrology was consulted.  Patient underwent tunneled dialysis catheter placement by vascular surgery on 12/4.  New events last 24 hours / Subjective: Feeling well, no nausea or vomiting.  Does not like the hospital food.  Left foot pain has improved.  Assessment & Plan:   Principal Problem:   Acute renal failure superimposed on chronic kidney disease ([place]) Active Problems:   CHF (congestive heart failure) (HCC)   Essential hypertension   Diabetes mellitus with complication (HCC)   Leukocytosis   Hypokalemia   Atrial fibrillation, chronic (HCC)   Chronic anticoagulation   AKI on CKD stage IV -At baseline patient's creatinine previously had been around 2.  Patient presents with creatinine elevated up to 4.82 with a BUN 130.  Suspect prerenal cause of as patient had recently started diuretics and has also had N/V, but there is concern for patient becoming uremic  -Nephrology following -Status post tunneled dialysis catheter placement 12/4 -Planning for first dialysis session today  Nausea and vomiting -Appears likely multifactorial given worsening kidney function with BUN elevated  -Does not seem to be infectious in etiology -Supportive care  -Improved  Prolonged QT interval -QTc on admission noted to be 510 -Correct electrolyte abnormalities -Avoid QT prolonging medication -Recheck QTc 484  Persistent atrial fibrillation on chronic anticoagulation -Held Xarelto due to kidney function -Heparin drip per pharmacy for now, transition to ?Eliquis  Chronic combined systolic and diastolic CHF -Last EF noted to be 45 to 50% with grade 2 diastolic dysfunction in 57/3220  Patient does not clinically appear fluid overloaded at this time  Essential hypertension -Continue metoprolol, hydralazine  CAD -Continue imdur   Diabetes mellitus type 2 with hyperglycemia -Hemoglobin A1c -Lantus, novolog SSI -Diabetic coordinator consult  -Avoid hypoglycemia   Hyperlipidemia -Continue atorvastatin  GERD -Hold Protonix due to prolonged QT  Left foot pain -X-ray negative for fracture or dislocation -Suspect tendinitis, supportive care   DVT prophylaxis: IV heparin (on Xarelto PTA) Code Status: Full Family Communication: None at bedside Disposition Plan: Dialysis session planned for today   Consultants:   Nephrology  Vascular surgery  Procedures:   Tunneled dialysis catheter placed 12/4  Antimicrobials:  Anti-infectives (From admission, onward)   None       Objective: Vitals:   01/26/19 1646 01/26/19 2138 01/27/19 0500 01/27/19 0905  BP: (!) 116/44 (!) 105/51 (!) 102/55 (!) 134/49  Pulse: (!) 59 68 72 73  Resp: 18 16 18 18   Temp: 98.3 F (36.8 C) 98.4  F (36.9 C) 98.3 F (36.8 C) 98.3 F (36.8 C)  TempSrc: Oral Oral Oral Oral  SpO2: 93% 92% 95% 93%  Weight:  95.6 kg    Height:        Intake/Output Summary  (Last 24 hours) at 01/27/2019 1058 Last data filed at 01/27/2019 0700 Gross per 24 hour  Intake 1817.95 ml  Output 301 ml  Net 1516.95 ml   Filed Weights   01/24/19 1415 01/24/19 2311 01/26/19 2138  Weight: 95.3 kg 95.6 kg 95.6 kg    Examination: General exam: Appears calm and comfortable  Respiratory system: Clear to auscultation. Respiratory effort normal. Cardiovascular system: S1 & S2 heard, RRR. No pedal edema. Gastrointestinal system: Abdomen is nondistended, soft and nontender. Normal bowel sounds heard. Central nervous system: Alert and oriented. Non focal exam. Speech clear  Extremities: Symmetric in appearance bilaterally  Skin: No rashes, lesions or ulcers on exposed skin  Psychiatry: Judgement and insight appear stable. Mood & affect appropriate.   Data Reviewed: I have personally reviewed following labs and imaging studies  CBC: Recent Labs  Lab 01/23/19 2157 01/24/19 0922 01/25/19 0323 01/26/19 0216 01/27/19 0447  WBC 14.9* 14.2* 14.4* 12.3* 12.2*  NEUTROABS  --  11.3*  --   --   --   HGB 12.7 13.7 12.5 11.4* 11.1*  HCT 39.7 43.3 38.8 37.2 35.9*  MCV 86.3 85.7 84.2 87.3 88.0  PLT 321 351 309 283 528   Basic Metabolic Panel: Recent Labs  Lab 01/23/19 2157 01/24/19 0820 01/24/19 0922 01/25/19 0323 01/25/19 1201 01/26/19 0216 01/27/19 0447  NA 133* 136  --  138  --  135 140  K 3.0* 4.2  --  2.7*  --  3.3* 3.7  CL 84* 86*  --  90*  --  92* 96*  CO2 33* 33*  --  33*  --  29 30  GLUCOSE 486* 174*  --  181*  --  304* 296*  BUN 130* 129*  --  127*  --  116* 107*  CREATININE 4.82* 4.69*  --  4.55*  --  4.12* 3.77*  CALCIUM 8.0* 8.4*  --  8.0*  --  8.1* 8.3*  MG  --   --  1.9  --  1.7 1.8  --   PHOS  --   --   --  4.3  --   --   --    GFR: Estimated Creatinine Clearance: 14 mL/min (A) (by C-G formula based on SCr of 3.77 mg/dL (H)). Liver Function Tests: Recent Labs  Lab 01/24/19 0820 01/25/19 0323  AST 26  --   ALT 11  --   ALKPHOS 101  --    BILITOT 1.1  --   PROT 7.2  --   ALBUMIN 3.1* 2.6*   No results for input(s): LIPASE, AMYLASE in the last 168 hours. No results for input(s): AMMONIA in the last 168 hours. Coagulation Profile: No results for input(s): INR, PROTIME in the last 168 hours. Cardiac Enzymes: Recent Labs  Lab 01/24/19 1729  CKTOTAL 100   BNP (last 3 results) No results for input(s): PROBNP in the last 8760 hours. HbA1C: Recent Labs    01/24/19 1729  HGBA1C 8.9*   CBG: Recent Labs  Lab 01/26/19 1354 01/26/19 1430 01/26/19 1648 01/26/19 2136 01/27/19 0641  GLUCAP 104* 121* 175* 263* 226*   Lipid Profile: No results for input(s): CHOL, HDL, LDLCALC, TRIG, CHOLHDL, LDLDIRECT in the last 72 hours. Thyroid Function Tests: No results  for input(s): TSH, T4TOTAL, FREET4, T3FREE, THYROIDAB in the last 72 hours. Anemia Panel: No results for input(s): VITAMINB12, FOLATE, FERRITIN, TIBC, IRON, RETICCTPCT in the last 72 hours. Sepsis Labs: No results for input(s): PROCALCITON, LATICACIDVEN in the last 168 hours.  Recent Results (from the past 240 hour(s))  Novel Coronavirus, NAA (Hosp order, Send-out to Ref Lab; TAT 18-24 hrs     Status: None   Collection Time: 01/23/19  5:38 PM   Specimen: Nasopharyngeal Swab; Respiratory  Result Value Ref Range Status   SARS-CoV-2, NAA NOT DETECTED NOT DETECTED Final    Comment: (NOTE) This nucleic acid amplification test was developed and its performance characteristics determined by [employer]. Nucleic acid amplification tests include PCR and TMA. This test has not been FDA cleared or approved. This test has been authorized by FDA under an Emergency Use Authorization (EUA). This test is only authorized for the duration of time the declaration that circumstances exist justifying the authorization of the emergency use of in vitro diagnostic tests for detection of SARS-CoV-2 virus and/or diagnosis of COVID-19 infection under section 564(b)(1) of the  Act, 21 U.S.C. 742VZD-6(L) (1), unless the authorization is terminated or revoked sooner. When diagnostic testing is negative, the possibility of a false negative result should be considered in the context of a patient's recent exposures and the presence of clinical signs and symptoms consistent with COVID-19. An individual without symptoms of COVID- 19 and who is not shedding SARS-CoV-2 vi rus would expect to have a negative (not detected) result in this assay. Performed At: [facility] [address] [place], [place] [id number] Rush Farmer MD JJ:[id number]    [place]  Final    Comment: Performed at [facility], [place] [address], [place], [place] [address]  SARS CORONAVIRUS 2 (TAT 6-24 HRS) Nasopharyngeal Nasopharyngeal Swab     Status: None   Collection Time: 01/24/19 12:19 PM   Specimen: Nasopharyngeal Swab  Result Value Ref Range Status   SARS Coronavirus 2 NEGATIVE NEGATIVE Final    Comment: (NOTE) SARS-CoV-2 target nucleic acids are NOT DETECTED. The SARS-CoV-2 RNA is generally detectable in upper and lower respiratory specimens during the acute phase of infection. Negative results do not preclude SARS-CoV-2 infection, do not rule out co-infections with other pathogens, and should not be used as the sole basis for treatment or other patient management decisions. Negative results must be combined with clinical observations, patient history, and epidemiological information. The expected result is Negative. Fact Sheet for Patients: SugarRoll.be Fact Sheet for Healthcare Providers: https://www.woods-mathews.com/ This test is not yet approved or cleared by the [place] FDA and  has been authorized for detection and/or diagnosis of SARS-CoV-2 by FDA under an Emergency Use Authorization (EUA). This EUA will remain  in effect (meaning this test can be used) for the duration of the COVID-19  declaration under Section 56 4(b)(1) of the Act, 21 U.S.C. section 360bbb-3(b)(1), unless the authorization is terminated or revoked sooner. Performed at [facility], [place] [address] [place], [place] [address]   Surgical PCR screen     Status: None   Collection Time: 01/25/19  5:03 PM   Specimen: Nasal Mucosa; Nasal Swab  Result Value Ref Range Status   MRSA, PCR NEGATIVE NEGATIVE Final   Staphylococcus aureus NEGATIVE NEGATIVE Final    Comment: (NOTE) The Xpert SA Assay (FDA approved for NASAL specimens in patients 44 years of age and older), is one component of a comprehensive surveillance program. It is  not intended to diagnose infection nor to guide or monitor treatment. Performed at [facility], [place] [address] [place], [place] [address]       Radiology Studies: Dg Chest Port 1 View  Result Date: 01/26/2019 CLINICAL DATA:  Dialysis catheter insertion. EXAM: PORTABLE CHEST 1 VIEW COMPARISON:  January 23, 2019. FINDINGS: Stable cardiomegaly. Interval placement of right internal jugular catheter with distal tip in expected position of SVC. No pneumothorax or pleural effusion is noted. Stable minimal lingular subsegmental atelectasis or scarring. Bony thorax is unremarkable. IMPRESSION: Interval placement of right internal jugular dialysis catheter. No pneumothorax is noted. Stable minimal lingular subsegmental atelectasis or scarring. Electronically Signed   By: Marijo Conception M.D.   On: 01/26/2019 13:43   Dg Foot Complete Left  Result Date: 01/26/2019 CLINICAL DATA:  Acute left foot pain and swelling. EXAM: LEFT FOOT - COMPLETE 3+ VIEW COMPARISON:  None. FINDINGS: There is no evidence of fracture or dislocation. There is no evidence of arthropathy or other focal bone abnormality. Soft tissues are unremarkable. IMPRESSION: Negative. Electronically Signed   By: Marijo Conception M.D.   On: 01/26/2019 10:47   Hybrid Or Imaging (mc Only)  Result Date: 01/26/2019 There  is no interpretation for this exam.  This order is for images obtained during a surgical procedure.  Please See "Surgeries" Tab for more information regarding the procedure.      Scheduled Meds: . atorvastatin  80 mg Oral QPM  . calcitRIOL  0.25 mcg Oral Daily  . Chlorhexidine Gluconate Cloth  6 each Topical Daily  . hydrALAZINE  100 mg Oral TID  . insulin aspart  0-6 Units Subcutaneous TID WC  . insulin glargine  25 Units Subcutaneous Daily  . isosorbide mononitrate  30 mg Oral Daily  . latanoprost  1 drop Both Eyes QHS  . metoprolol succinate  25 mg Oral QHS  . metoprolol succinate  50 mg Oral Daily  . sodium chloride flush  3 mL Intravenous Once  . sodium chloride flush  3 mL Intravenous Q12H   Continuous Infusions: . heparin 1,000 Units/hr (01/27/19 0953)     LOS: 3 days      Time spent: 25 minutes   Dessa Phi, DO [facility] 01/27/2019, 10:58 AM   Available via Epic secure chat 7am-7pm After these hours, please refer to coverage provider listed on [email or web address]

## 2019-01-28 LAB — CBC
HCT: 36 % (ref 36.0–46.0)
Hemoglobin: 11 g/dL — ABNORMAL LOW (ref 12.0–15.0)
MCH: 27 pg (ref 26.0–34.0)
MCHC: 30.6 g/dL (ref 30.0–36.0)
MCV: 88.5 fL (ref 80.0–100.0)
Platelets: 229 10*3/uL (ref 150–400)
RBC: 4.07 MIL/uL (ref 3.87–5.11)
RDW: 14.6 % (ref 11.5–15.5)
WBC: 13.2 10*3/uL — ABNORMAL HIGH (ref 4.0–10.5)
nRBC: 0 % (ref 0.0–0.2)

## 2019-01-28 LAB — HEPARIN LEVEL (UNFRACTIONATED): Heparin Unfractionated: 0.54 IU/mL (ref 0.30–0.70)

## 2019-01-28 LAB — BASIC METABOLIC PANEL
Anion gap: 11 (ref 5–15)
BUN: 62 mg/dL — ABNORMAL HIGH (ref 8–23)
CO2: 26 mmol/L (ref 22–32)
Calcium: 8.3 mg/dL — ABNORMAL LOW (ref 8.9–10.3)
Chloride: 101 mmol/L (ref 98–111)
Creatinine, Ser: 3.17 mg/dL — ABNORMAL HIGH (ref 0.44–1.00)
GFR calc Af Amer: 16 mL/min — ABNORMAL LOW (ref >60)
GFR calc non Af Amer: 14 mL/min — ABNORMAL LOW (ref >60)
Glucose, Bld: 248 mg/dL — ABNORMAL HIGH (ref 70–99)
Potassium: 3.9 mmol/L (ref 3.5–5.1)
Sodium: 138 mmol/L (ref 135–145)

## 2019-01-28 LAB — GLUCOSE, CAPILLARY
Glucose-Capillary: 268 mg/dL — ABNORMAL HIGH (ref 70–99)
Glucose-Capillary: 305 mg/dL — ABNORMAL HIGH (ref 70–99)
Glucose-Capillary: 164 mg/dL — ABNORMAL HIGH (ref 70–99)
Glucose-Capillary: 187 mg/dL — ABNORMAL HIGH (ref 70–99)

## 2019-01-28 MED ORDER — INSULIN ASPART 100 UNIT/ML ~~LOC~~ SOLN
2.0000 [IU] | Freq: Three times a day (TID) | SUBCUTANEOUS | Status: DC
  Administered 2019-01-28 – 2019-01-31 (×9): 2 [IU] via SUBCUTANEOUS

## 2019-01-28 NOTE — Progress Notes (Signed)
[facility] for [facility]arin Indication: atrial fibrillation  Allergies  Allergen Reactions  . Eggs Or Egg-Derived Products Nausea And Vomiting  . Lisinopril Nausea And Vomiting  . Penicillins Nausea And Vomiting    Has patient had a PCN reaction causing immediate rash, facial/tongue/throat swelling, SOB or lightheadedness with hypotension: No Has patient had a PCN reaction causing severe rash involving mucus membranes or skin necrosis: No Has patient had a PCN reaction that required hospitalization: No Has patient had a PCN reaction occurring within the last 10 years: No If all of the above answers are "NO", then may proceed with Cephalosporin use.     Patient Measurements: Height: 5\' 3"  (160 cm) Weight: 196 lb 13.9 oz (89.3 kg) IBW/kg (Calculated) : 52.4 Heparin Dosing Weight: 74.4kg  Vital Signs: Temp: 98.3 F (36.8 C) (12/06 0844) Temp Source: Oral (12/06 0844) BP: 120/45 (12/06 0844) Pulse Rate: 57 (12/06 0844)  Labs: Recent Labs    01/25/19 1715  01/26/19 0216 01/26/19 1012 01/27/19 0447 01/28/19 0550  HGB  --    < > 11.4*  --  11.1* 11.0*  HCT  --   --  37.2  --  35.9* 36.0  PLT  --   --  283  --  253 229  APTT 122*  --  85*  --  70*  --   HEPARINUNFRC 1.09*  --  0.58 0.49 0.29* 0.54  CREATININE  --   --  4.12*  --  3.77* 3.17*   < > = values in this interval not displayed.    Estimated Creatinine Clearance: 16 mL/min (A) (by C-G formula based on SCr of 3.17 mg/dL (H)).  Assessment: 78 y.o. female with h/o Afib, PTA Xarelto on hold, for heparin in the interim.  Pt is s/p VVS placement of TDC for hemodialysis on 12/4.  Heparin level 0.54 on 1000 units/hr.  No bleeding noted.  Goal of Therapy:  Heparin level 0.3-0.7 units/ml aPTT 66-102 seconds Monitor platelets by anticoagulation protocol: Yes   Plan:  Continue heparin at 1000 units/hr Heparin level and CBC daily Follow-up plans to resume oral  anticoagulation  [employer], Pharm.D., BCPS Clinical Pharmacist Clinical phone for 01/28/2019 from 8:30-4:00 is [phone].  **Pharmacist phone directory can now be found on [email or web address] ([email or web address]).  Listed under [place].  01/28/2019 9:10 AM

## 2019-01-28 NOTE — Progress Notes (Signed)
PROGRESS NOTE    Stephanie Woodward  KVQ:[id number] DOB: 10/29/1940 DOA: 01/23/2019 PCP: Alroy Dust, L.Marlou Sa, MD     Brief Narrative:  Stephanie Woodward is a 78 y.o. female with medical history significant of HTN, HLD, CHF, IDDM, solitary kidney, chronic kidney disease stage IV followed by Dr. Clover Mealy, chronic atrial fibrillation on Xarelto.  She presents with complaints of nausea and vomiting.  Patient reports being nauseated for quite some time, but started having intermittent vomiting 4 days ago.  Emesis is noted to be nonbloody and nonbilious and appears.  Associated symptoms include lower abdominal cramping and worsening memory.  Daughter notes that the patient diabetes has been wildly uncontrolled with blood sugars anywhere from 42->400.  She was finally able to get set up with the endocrinologist and has an appointment sometime next week.  She was seen by her nephrologist 3-4 weeks ago and started on metolazone.  Patient had a fistula placed at the end of September 2020 by Dr. Carlis Abbott vascular surgery, but it hasn't fully matured. Nephrology was consulted.  Patient underwent tunneled dialysis catheter placement by vascular surgery on 12/4.  Started first dialysis session 12/5.  New events last 24 hours / Subjective: States that dialysis yesterday was rough.  Ate breakfast this morning and doing well today.  Assessment & Plan:   Principal Problem:   Acute renal failure superimposed on chronic kidney disease ([place]) Active Problems:   CHF (congestive heart failure) (HCC)   Essential hypertension   Diabetes mellitus with complication (HCC)   Leukocytosis   Hypokalemia   Atrial fibrillation, chronic (HCC)   Chronic anticoagulation   AKI on CKD stage IV -At baseline patient's creatinine previously had been around 2.  Patient presents with creatinine elevated up to 4.82 with a BUN 130.  Suspect prerenal cause of as patient had recently started diuretics and has also had N/V, but there is concern  for patient becoming uremic -Status post tunneled dialysis catheter placement 12/4 -Started inpatient dialysis 12/5.  Nephrology following for another dialysis session tomorrow as well as outpatient dialysis establishment  Nausea and vomiting -Appears likely multifactorial given worsening kidney function with BUN elevated  -Resolved, tolerating diet today  Prolonged QT interval -QTc on admission noted to be 510 -Correct electrolyte abnormalities -Avoid QT prolonging medication -Recheck QTc 484  Persistent atrial fibrillation on chronic anticoagulation -Held Xarelto due to kidney function -Heparin drip per pharmacy for now, transition to ?Eliquis  Chronic combined systolic and diastolic CHF -Last EF noted to be 45 to 50% with grade 2 diastolic dysfunction in 64/3329  Patient does not clinically appear fluid overloaded at this time  Essential hypertension -Continue metoprolol, hydralazine  CAD -Continue imdur   Diabetes mellitus type 2 with hyperglycemia -Hemoglobin A1c -Lantus, novolog SSI -Diabetic coordinator consult  -Avoid hypoglycemia   Hyperlipidemia -Continue atorvastatin  GERD -Hold Protonix due to prolonged QT  Left foot pain -X-ray negative for fracture or dislocation -Suspect tendinitis, supportive care -Improved   DVT prophylaxis: IV heparin (on Xarelto PTA) Code Status: Full Family Communication: None at bedside Disposition Plan: Dialysis planned for tomorrow, outpatient dialysis establishment in process   Consultants:   Nephrology  Vascular surgery  Procedures:   Tunneled dialysis catheter placed 12/4  Antimicrobials:  Anti-infectives (From admission, onward)   None       Objective: Vitals:   01/27/19 2125 01/28/19 0300 01/28/19 0405 01/28/19 0844  BP: 128/88  (!) 134/50 (!) 120/45  Pulse: 79  70 (!) 57  Resp: 12  12 18  Temp: 98.7 F (37.1 C)  98.9 F (37.2 C) 98.3 F (36.8 C)  TempSrc: Oral  Oral Oral  SpO2: 96%   93% 93%  Weight:  89.3 kg    Height:        Intake/Output Summary (Last 24 hours) at 01/28/2019 0952 Last data filed at 01/28/2019 0601 Gross per 24 hour  Intake 936.25 ml  Output 1003 ml  Net -66.75 ml   Filed Weights   01/27/19 1250 01/27/19 1533 01/28/19 0300  Weight: 91.6 kg 90.5 kg 89.3 kg    Examination: General exam: Appears calm and comfortable  Respiratory system: Clear to auscultation. Respiratory effort normal. Cardiovascular system: S1 & S2 heard, No pedal edema. Gastrointestinal system: Abdomen is nondistended, soft and nontender. Normal bowel sounds heard. Central nervous system: Alert and oriented. Non focal exam. Speech clear  Extremities: Symmetric in appearance bilaterally  Skin: No rashes, lesions or ulcers on exposed skin  Psychiatry: Judgement and insight appear stable. Mood & affect appropriate.    Data Reviewed: I have personally reviewed following labs and imaging studies  CBC: Recent Labs  Lab 01/24/19 0922 01/25/19 0323 01/26/19 0216 01/27/19 0447 01/28/19 0550  WBC 14.2* 14.4* 12.3* 12.2* 13.2*  NEUTROABS 11.3*  --   --   --   --   HGB 13.7 12.5 11.4* 11.1* 11.0*  HCT 43.3 38.8 37.2 35.9* 36.0  MCV 85.7 84.2 87.3 88.0 88.5  PLT 351 309 283 253 735   Basic Metabolic Panel: Recent Labs  Lab 01/24/19 0820 01/24/19 0922 01/25/19 0323 01/25/19 1201 01/26/19 0216 01/27/19 0447 01/28/19 0550  NA 136  --  138  --  135 140 138  K 4.2  --  2.7*  --  3.3* 3.7 3.9  CL 86*  --  90*  --  92* 96* 101  CO2 33*  --  33*  --  29 30 26   GLUCOSE 174*  --  181*  --  304* 296* 248*  BUN 129*  --  127*  --  116* 107* 62*  CREATININE 4.69*  --  4.55*  --  4.12* 3.77* 3.17*  CALCIUM 8.4*  --  8.0*  --  8.1* 8.3* 8.3*  MG  --  1.9  --  1.7 1.8  --   --   PHOS  --   --  4.3  --   --   --   --    GFR: Estimated Creatinine Clearance: 16 mL/min (A) (by C-G formula based on SCr of 3.17 mg/dL (H)). Liver Function Tests: Recent Labs  Lab 01/24/19 0820  01/25/19 0323  AST 26  --   ALT 11  --   ALKPHOS 101  --   BILITOT 1.1  --   PROT 7.2  --   ALBUMIN 3.1* 2.6*   No results for input(s): LIPASE, AMYLASE in the last 168 hours. No results for input(s): AMMONIA in the last 168 hours. Coagulation Profile: No results for input(s): INR, PROTIME in the last 168 hours. Cardiac Enzymes: Recent Labs  Lab 01/24/19 1729  CKTOTAL 100   BNP (last 3 results) No results for input(s): PROBNP in the last 8760 hours. HbA1C: No results for input(s): HGBA1C in the last 72 hours. CBG: Recent Labs  Lab 01/27/19 0641 01/27/19 1103 01/27/19 1631 01/27/19 2122 01/28/19 0700  GLUCAP 226* 301* 110* 308* 268*   Lipid Profile: No results for input(s): CHOL, HDL, LDLCALC, TRIG, CHOLHDL, LDLDIRECT in the last 72 hours. Thyroid Function Tests: No  results for input(s): TSH, T4TOTAL, FREET4, T3FREE, THYROIDAB in the last 72 hours. Anemia Panel: No results for input(s): VITAMINB12, FOLATE, FERRITIN, TIBC, IRON, RETICCTPCT in the last 72 hours. Sepsis Labs: No results for input(s): PROCALCITON, LATICACIDVEN in the last 168 hours.  Recent Results (from the past 240 hour(s))  Novel Coronavirus, NAA (Hosp order, Send-out to Ref Lab; TAT 18-24 hrs     Status: None   Collection Time: 01/23/19  5:38 PM   Specimen: Nasopharyngeal Swab; Respiratory  Result Value Ref Range Status   SARS-CoV-2, NAA NOT DETECTED NOT DETECTED Final    Comment: (NOTE) This nucleic acid amplification test was developed and its performance characteristics determined by [employer]. Nucleic acid amplification tests include PCR and TMA. This test has not been FDA cleared or approved. This test has been authorized by FDA under an Emergency Use Authorization (EUA). This test is only authorized for the duration of time the declaration that circumstances exist justifying the authorization of the emergency use of in vitro diagnostic tests for detection of SARS-CoV-2 virus  and/or diagnosis of COVID-19 infection under section 564(b)(1) of the Act, 21 U.S.C. 295MWU-1(L) (1), unless the authorization is terminated or revoked sooner. When diagnostic testing is negative, the possibility of a false negative result should be considered in the context of a patient's recent exposures and the presence of clinical signs and symptoms consistent with COVID-19. An individual without symptoms of COVID- 19 and who is not shedding SARS-CoV-2 vi rus would expect to have a negative (not detected) result in this assay. Performed At: [facility] [address] [place], [place] [id number] Rush Farmer MD ZD:[id number]    Adin  Final    Comment: Performed at [facility], [place] [address] [place], [place] [address]  SARS CORONAVIRUS 2 (TAT 6-24 HRS) Nasopharyngeal Nasopharyngeal Swab     Status: None   Collection Time: 01/24/19 12:19 PM   Specimen: Nasopharyngeal Swab  Result Value Ref Range Status   SARS Coronavirus 2 NEGATIVE NEGATIVE Final    Comment: (NOTE) SARS-CoV-2 target nucleic acids are NOT DETECTED. The SARS-CoV-2 RNA is generally detectable in upper and lower respiratory specimens during the acute phase of infection. Negative results do not preclude SARS-CoV-2 infection, do not rule out co-infections with other pathogens, and should not be used as the sole basis for treatment or other patient management decisions. Negative results must be combined with clinical observations, patient history, and epidemiological information. The expected result is Negative. Fact Sheet for Patients: SugarRoll.be Fact Sheet for Healthcare Providers: https://www.woods-mathews.com/ This test is not yet approved or cleared by the [place] FDA and  has been authorized for detection and/or diagnosis of SARS-CoV-2 by FDA under an Emergency Use Authorization (EUA). This EUA will remain  in  effect (meaning this test can be used) for the duration of the COVID-19 declaration under Section 56 4(b)(1) of the Act, 21 U.S.C. section 360bbb-3(b)(1), unless the authorization is terminated or revoked sooner. Performed at [facility], [place] [address] [place], [place] [address]   Surgical PCR screen     Status: None   Collection Time: 01/25/19  5:03 PM   Specimen: Nasal Mucosa; Nasal Swab  Result Value Ref Range Status   MRSA, PCR NEGATIVE NEGATIVE Final   Staphylococcus aureus NEGATIVE NEGATIVE Final    Comment: (NOTE) The Xpert SA Assay (FDA approved for NASAL specimens in patients 35 years of age and older), is one component of a comprehensive surveillance program. It  is not intended to diagnose infection nor to guide or monitor treatment. Performed at [facility], [place] [address] [place], [place] [address]       Radiology Studies: Dg Chest Port 1 View  Result Date: 01/26/2019 CLINICAL DATA:  Dialysis catheter insertion. EXAM: PORTABLE CHEST 1 VIEW COMPARISON:  January 23, 2019. FINDINGS: Stable cardiomegaly. Interval placement of right internal jugular catheter with distal tip in expected position of SVC. No pneumothorax or pleural effusion is noted. Stable minimal lingular subsegmental atelectasis or scarring. Bony thorax is unremarkable. IMPRESSION: Interval placement of right internal jugular dialysis catheter. No pneumothorax is noted. Stable minimal lingular subsegmental atelectasis or scarring. Electronically Signed   By: Marijo Conception M.D.   On: 01/26/2019 13:43   Dg Foot Complete Left  Result Date: 01/26/2019 CLINICAL DATA:  Acute left foot pain and swelling. EXAM: LEFT FOOT - COMPLETE 3+ VIEW COMPARISON:  None. FINDINGS: There is no evidence of fracture or dislocation. There is no evidence of arthropathy or other focal bone abnormality. Soft tissues are unremarkable. IMPRESSION: Negative. Electronically Signed   By: Marijo Conception M.D.   On: 01/26/2019  10:47   Hybrid Or Imaging (mc Only)  Result Date: 01/26/2019 There is no interpretation for this exam.  This order is for images obtained during a surgical procedure.  Please See "Surgeries" Tab for more information regarding the procedure.      Scheduled Meds: . atorvastatin  80 mg Oral QPM  . calcitRIOL  0.25 mcg Oral Daily  . Chlorhexidine Gluconate Cloth  6 each Topical Daily  . hydrALAZINE  100 mg Oral TID  . insulin aspart  0-6 Units Subcutaneous TID WC  . insulin glargine  25 Units Subcutaneous Daily  . isosorbide mononitrate  30 mg Oral Daily  . latanoprost  1 drop Both Eyes QHS  . metoprolol succinate  25 mg Oral QHS  . metoprolol succinate  50 mg Oral Daily  . sodium chloride flush  3 mL Intravenous Once  . sodium chloride flush  3 mL Intravenous Q12H   Continuous Infusions: . heparin 1,000 Units/hr (01/28/19 0450)     LOS: 4 days      Time spent: 25 minutes   Dessa Phi, DO [facility] 01/28/2019, 9:52 AM   Available via Epic secure chat 7am-7pm After these hours, please refer to coverage provider listed on [email or web address]

## 2019-01-28 NOTE — Plan of Care (Signed)
  Problem: Clinical Measurements: Goal: Ability to maintain clinical measurements within normal limits will improve Outcome: Progressing   

## 2019-01-28 NOTE — Progress Notes (Addendum)
Patient ID: Stephanie Woodward, female   DOB: 1940-12-29, 78 y.o.   MRN: [id number] [facility] Progress Note   Assessment/ Plan:   1.  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  With immature left BCF and now status post right IJ TDC.  She underwent her first hemodialysis treatment yesterday and I will order for her second dialysis treatment again tomorrow as well as initiate outpatient dialysis unit placement process. 2.  Hypokalemia: Secondary to GI losses and poor intake, corrected with oral supplementation and monitor with hemodialysis. 3.  Hypertension: Blood pressures currently under good control, will continue to follow with hemodialysis. 4.  Moderate to severe pulmonary hypertension: With combined systolic/diastolic CHF and currently appears to be compensated-we will continue to follow with UF on HD 5.  Paroxysmal atrial fibrillation: Appears to be rate controlled at this time.  On heparin drip and may potentially be able to begin anticoagulation with Eliquis at lower renally adjusted dose.  Subjective:   Had some nausea earlier during dialysis yesterday but completed treatment without problem.  Slept better in recliner.   Objective:   BP (!) 120/45 (BP Location: Right Arm)   Pulse (!) 57   Temp 98.3 F (36.8 C) (Oral)   Resp 18   Ht 5\' 3"  (1.6 m)   Wt 89.3 kg   SpO2 93%   BMI 34.87 kg/m   Intake/Output Summary (Last 24 hours) at 01/28/2019 0945 Last data filed at 01/28/2019 0601 Gross per 24 hour  Intake 936.25 ml  Output 1003 ml  Net -66.75 ml   Weight change: -4 kg  Physical Exam: Gen: Comfortably resting in recliner CVS: Pulse regular rhythm, normal rate, S1 and S2 normal Resp: Clear to auscultation without rales or rhonchi.  Right IJ TDC in situ Abd: Soft, flat, nontender Ext: Nonpitting lower extremity edema, left upper arm BCF with  palpable thrill  Imaging: Dg Chest Port 1 View  Result Date: 01/26/2019 CLINICAL DATA:  Dialysis catheter insertion. EXAM: PORTABLE CHEST 1 VIEW COMPARISON:  January 23, 2019. FINDINGS: Stable cardiomegaly. Interval placement of right internal jugular catheter with distal tip in expected position of SVC. No pneumothorax or pleural effusion is noted. Stable minimal lingular subsegmental atelectasis or scarring. Bony thorax is unremarkable. IMPRESSION: Interval placement of right internal jugular dialysis catheter. No pneumothorax is noted. Stable minimal lingular subsegmental atelectasis or scarring. Electronically Signed   By: Marijo Conception M.D.   On: 01/26/2019 13:43   Dg Foot Complete Left  Result Date: 01/26/2019 CLINICAL DATA:  Acute left foot pain and swelling. EXAM: LEFT FOOT - COMPLETE 3+ VIEW COMPARISON:  None. FINDINGS: There is no evidence of fracture or dislocation. There is no evidence of arthropathy or other focal bone abnormality. Soft tissues are unremarkable. IMPRESSION: Negative. Electronically Signed   By: Marijo Conception M.D.   On: 01/26/2019 10:47   Hybrid Or Imaging (mc Only)  Result Date: 01/26/2019 There is no interpretation for this exam.  This order is for images obtained during a surgical procedure.  Please See "Surgeries" Tab for more information regarding the procedure.    Labs: BMET Recent Labs  Lab 01/23/19 1822 01/23/19 2157 01/24/19 0820 01/25/19 0323 01/26/19 0216 01/27/19 0447 01/28/19 0550  NA 137 133* 136 138 135 140 138  K 2.9* 3.0* 4.2 2.7* 3.3* 3.7 3.9  CL 87* 84* 86* 90* 92* 96* 101  CO2 33*  33* 33* 33* 29 30 26   GLUCOSE 192* 486* 174* 181* 304* 296* 248*  BUN 127* 130* 129* 127* 116* 107* 62*  CREATININE 4.76* 4.82* 4.69* 4.55* 4.12* 3.77* 3.17*  CALCIUM 8.2* 8.0* 8.4* 8.0* 8.1* 8.3* 8.3*  PHOS  --   --   --  4.3  --   --   --    CBC Recent Labs  Lab 01/24/19 0922 01/25/19 0323 01/26/19 0216 01/27/19 0447 01/28/19 0550  WBC 14.2*  14.4* 12.3* 12.2* 13.2*  NEUTROABS 11.3*  --   --   --   --   HGB 13.7 12.5 11.4* 11.1* 11.0*  HCT 43.3 38.8 37.2 35.9* 36.0  MCV 85.7 84.2 87.3 88.0 88.5  PLT 351 309 283 253 229    Medications:    . atorvastatin  80 mg Oral QPM  . calcitRIOL  0.25 mcg Oral Daily  . Chlorhexidine Gluconate Cloth  6 each Topical Daily  . hydrALAZINE  100 mg Oral TID  . insulin aspart  0-6 Units Subcutaneous TID WC  . insulin glargine  25 Units Subcutaneous Daily  . isosorbide mononitrate  30 mg Oral Daily  . latanoprost  1 drop Both Eyes QHS  . metoprolol succinate  25 mg Oral QHS  . metoprolol succinate  50 mg Oral Daily  . sodium chloride flush  3 mL Intravenous Once  . sodium chloride flush  3 mL Intravenous Q12H   Elmarie Shiley, MD 01/28/2019, 9:45 AM

## 2019-01-29 LAB — BASIC METABOLIC PANEL
Anion gap: 12 (ref 5–15)
BUN: 70 mg/dL — ABNORMAL HIGH (ref 8–23)
CO2: 26 mmol/L (ref 22–32)
Calcium: 8.8 mg/dL — ABNORMAL LOW (ref 8.9–10.3)
Chloride: 100 mmol/L (ref 98–111)
Creatinine, Ser: 3.65 mg/dL — ABNORMAL HIGH (ref 0.44–1.00)
GFR calc Af Amer: 13 mL/min — ABNORMAL LOW (ref >60)
GFR calc non Af Amer: 11 mL/min — ABNORMAL LOW (ref >60)
Glucose, Bld: 249 mg/dL — ABNORMAL HIGH (ref 70–99)
Potassium: 4 mmol/L (ref 3.5–5.1)
Sodium: 138 mmol/L (ref 135–145)

## 2019-01-29 LAB — HEPARIN LEVEL (UNFRACTIONATED)
Heparin Unfractionated: 0.26 IU/mL — ABNORMAL LOW (ref 0.30–0.70)
Heparin Unfractionated: 0.42 IU/mL (ref 0.30–0.70)

## 2019-01-29 LAB — CBC
HCT: 36.1 % (ref 36.0–46.0)
Hemoglobin: 11 g/dL — ABNORMAL LOW (ref 12.0–15.0)
MCH: 27.4 pg (ref 26.0–34.0)
MCHC: 30.5 g/dL (ref 30.0–36.0)
MCV: 89.8 fL (ref 80.0–100.0)
Platelets: 251 10*3/uL (ref 150–400)
RBC: 4.02 MIL/uL (ref 3.87–5.11)
RDW: 14.5 % (ref 11.5–15.5)
WBC: 14 10*3/uL — ABNORMAL HIGH (ref 4.0–10.5)
nRBC: 0 % (ref 0.0–0.2)

## 2019-01-29 LAB — GLUCOSE, CAPILLARY
Glucose-Capillary: 231 mg/dL — ABNORMAL HIGH (ref 70–99)
Glucose-Capillary: 136 mg/dL — ABNORMAL HIGH (ref 70–99)
Glucose-Capillary: 112 mg/dL — ABNORMAL HIGH (ref 70–99)
Glucose-Capillary: 267 mg/dL — ABNORMAL HIGH (ref 70–99)

## 2019-01-29 MED ORDER — HEPARIN SODIUM (PORCINE) 1000 UNIT/ML IJ SOLN
INTRAMUSCULAR | Status: AC
  Administered 2019-01-29: 3400 [IU] via INTRAVENOUS_CENTRAL
  Filled 2019-01-29: qty 4

## 2019-01-29 MED ORDER — INSULIN GLARGINE 100 UNIT/ML ~~LOC~~ SOLN
30.0000 [IU] | Freq: Every day | SUBCUTANEOUS | Status: DC
  Administered 2019-01-29 – 2019-01-31 (×3): 30 [IU] via SUBCUTANEOUS
  Filled 2019-01-29 (×3): qty 0.3

## 2019-01-29 MED ORDER — POLYETHYLENE GLYCOL 3350 17 G PO PACK
17.0000 g | PACK | Freq: Every day | ORAL | Status: DC | PRN
  Administered 2019-01-29: 17 g via ORAL
  Filled 2019-01-29: qty 1

## 2019-01-29 MED ORDER — HEPARIN SODIUM (PORCINE) 1000 UNIT/ML DIALYSIS
40.0000 [IU]/kg | INTRAMUSCULAR | Status: DC | PRN
  Administered 2019-01-29: 18:00:00 3400 [IU] via INTRAVENOUS_CENTRAL
  Filled 2019-01-29: qty 3.7

## 2019-01-29 NOTE — Procedures (Signed)
Patient seen on Hemodialysis. BP (!) 122/43   Pulse 75   Temp 99.3 F (37.4 C) (Oral)   Resp 17   Ht 5\' 3"  (1.6 m)   Wt 92.5 kg Comment: standing wt  SpO2 93%   BMI 36.12 kg/m   QB 300, UF goal 1.5L Tolerating treatment without complaints at this time.   Elmarie Shiley MD [facility]. Office # [phone] Pager # [phone] 3:52 PM

## 2019-01-29 NOTE — Progress Notes (Signed)
[place] for Heparin Indication: atrial fibrillation  Allergies  Allergen Reactions  . Eggs Or Egg-Derived Products Nausea And Vomiting  . Lisinopril Nausea And Vomiting  . Penicillins Nausea And Vomiting    Has patient had a PCN reaction causing immediate rash, facial/tongue/throat swelling, SOB or lightheadedness with hypotension: No Has patient had a PCN reaction causing severe rash involving mucus membranes or skin necrosis: No Has patient had a PCN reaction that required hospitalization: No Has patient had a PCN reaction occurring within the last 10 years: No If all of the above answers are "NO", then may proceed with Cephalosporin use.     Patient Measurements: Height: 5\' 3"  (160 cm) Weight: 203 lb 7.8 oz (92.3 kg) IBW/kg (Calculated) : 52.4 Heparin Dosing Weight: 74.4kg  Vital Signs: Temp: 98.8 F (37.1 C) (12/07 0423) Temp Source: Oral (12/07 0423) BP: 142/56 (12/07 0423) Pulse Rate: 85 (12/07 0423)  Labs: Recent Labs    01/27/19 0447 01/28/19 0550 01/29/19 0515  HGB 11.1* 11.0* 11.0*  HCT 35.9* 36.0 36.1  PLT 253 229 251  APTT 70*  --   --   HEPARINUNFRC 0.29* 0.54 0.26*  CREATININE 3.77* 3.17* 3.65*    Estimated Creatinine Clearance: 14.2 mL/min (A) (by C-G formula based on SCr of 3.65 mg/dL (H)).  Assessment: 78 y.o. female with h/o Afib, PTA Xarelto on hold, for heparin in the interim.  Pt is s/p VVS placement of TDC for hemodialysis on 12/4.  Heparin level 0.26 on 1000 units/hr.  No bleeding noted.  Goal of Therapy:  Heparin level 0.3-0.7 units/ml aPTT 66-102 seconds Monitor platelets by anticoagulation protocol: Yes   Plan:  Increase heparin to 1100 units/hr Heparin level in 8 hours Heparin level and CBC daily Follow-up plans to resume oral anticoagulation  Wake Conlee A. Levada Dy, PharmD, BCPS, FNKF Clinical Pharmacist [place] Please utilize Amion for appropriate phone number to reach the unit  pharmacist ([place])    01/29/2019 8:00 AM

## 2019-01-29 NOTE — Progress Notes (Signed)
Patient ID: Stephanie Woodward, female   DOB: 05-16-1940, 78 y.o.   MRN: [id number] [facility] Progress Note   Assessment/ Plan:   1.  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  With immature left BCF and now status post right IJ TDC.  Underwent first hemodialysis on 12/5, second treatment today and process initiated for outpatient dialysis unit placement. 2.  Hypertension: Blood pressures currently under good control, will continue to follow with hemodialysis. 3.  Moderate to severe pulmonary hypertension: With combined systolic/diastolic CHF and currently appears to be compensated-we will continue to follow with UF on HD 4.  Paroxysmal atrial fibrillation: Appears to be rate controlled at this time.  On heparin drip and may potentially be able to begin anticoagulation with Eliquis at lower renally adjusted dose.  5.  Secondary hyperparathyroidism: Calcium and phosphorus level within acceptable range, will follow up on recent PTH level. 6.  Anemia of chronic kidney disease: Hemoglobin/hematocrit currently at goal without overt loss postoperatively.  Subjective:   Denies any acute events overnight   Objective:   BP (!) 142/56 (BP Location: Right Arm)   Pulse 85   Temp 98.8 F (37.1 C) (Oral)   Resp 16   Ht 5\' 3"  (1.6 m)   Wt 92.3 kg   SpO2 94%   BMI 36.05 kg/m   Intake/Output Summary (Last 24 hours) at 01/29/2019 0856 Last data filed at 01/29/2019 8657 Gross per 24 hour  Intake 1950.87 ml  Output 600 ml  Net 1350.87 ml   Weight change: 0.7 kg  Physical Exam: Gen: Comfortably sitting in her recliner CVS: Pulse regular rhythm, normal rate, S1 and S2 normal Resp: Clear to auscultation without rales or rhonchi.  Right IJ TDC in situ Abd: Soft, flat, nontender Ext: Nonpitting lower extremity edema, left upper arm BCF with palpable thrill  Imaging: No  results found.  Labs: BMET Recent Labs  Lab 01/23/19 2157 01/24/19 0820 01/25/19 0323 01/26/19 0216 01/27/19 0447 01/28/19 0550 01/29/19 0515  NA 133* 136 138 135 140 138 138  K 3.0* 4.2 2.7* 3.3* 3.7 3.9 4.0  CL 84* 86* 90* 92* 96* 101 100  CO2 33* 33* 33* 29 30 26 26   GLUCOSE 486* 174* 181* 304* 296* 248* 249*  BUN 130* 129* 127* 116* 107* 62* 70*  CREATININE 4.82* 4.69* 4.55* 4.12* 3.77* 3.17* 3.65*  CALCIUM 8.0* 8.4* 8.0* 8.1* 8.3* 8.3* 8.8*  PHOS  --   --  4.3  --   --   --   --    CBC Recent Labs  Lab 01/24/19 0922  01/26/19 0216 01/27/19 0447 01/28/19 0550 01/29/19 0515  WBC 14.2*   < > 12.3* 12.2* 13.2* 14.0*  NEUTROABS 11.3*  --   --   --   --   --   HGB 13.7   < > 11.4* 11.1* 11.0* 11.0*  HCT 43.3   < > 37.2 35.9* 36.0 36.1  MCV 85.7   < > 87.3 88.0 88.5 89.8  PLT 351   < > 283 253 229 251   < > = values in this interval not displayed.    Medications:    . atorvastatin  80 mg Oral QPM  . calcitRIOL  0.25 mcg Oral Daily  . Chlorhexidine Gluconate Cloth  6 each Topical Daily  . hydrALAZINE  100 mg Oral TID  . insulin aspart  0-6 Units  Subcutaneous TID WC  . insulin aspart  2 Units Subcutaneous TID WC  . insulin glargine  30 Units Subcutaneous Daily  . isosorbide mononitrate  30 mg Oral Daily  . latanoprost  1 drop Both Eyes QHS  . metoprolol succinate  25 mg Oral QHS  . metoprolol succinate  50 mg Oral Daily  . sodium chloride flush  3 mL Intravenous Once  . sodium chloride flush  3 mL Intravenous Q12H   Elmarie Shiley, MD 01/29/2019, 8:56 AM

## 2019-01-29 NOTE — Progress Notes (Signed)
ANTICOAGULATION CONSULT NOTE   Pharmacy Consult for Heparin Indication: atrial fibrillation  Patient Measurements: Height: 5\' 3"  (160 cm) Weight: 203 lb 14.8 oz (92.5 kg)(standing wt) IBW/kg (Calculated) : 52.4 Heparin Dosing Weight: 74.4kg  Vital Signs: Temp: 99.3 F (37.4 C) (12/07 1455) Temp Source: Oral (12/07 1455) BP: 115/44 (12/07 1630) Pulse Rate: 60 (12/07 1630)  Labs: Recent Labs    01/27/19 0447 01/28/19 0550 01/29/19 0515 01/29/19 1633  HGB 11.1* 11.0* 11.0*  --   HCT 35.9* 36.0 36.1  --   PLT 253 229 251  --   APTT 70*  --   --   --   HEPARINUNFRC 0.29* 0.54 0.26* 0.42  CREATININE 3.77* 3.17* 3.65*  --     Estimated Creatinine Clearance: 14.2 mL/min (A) (by C-G formula based on SCr of 3.65 mg/dL (H)).  Assessment: 78 y.o. female with h/o Afib, PTA Xarelto on hold, for heparin in the interim.  Pt is s/p VVS placement of TDC for hemodialysis on 12/4.  Heparin level this evening is therapeutic after a rate increase earlier today (HL 0.42 << 0.26, goal of 0.3-0.7). No bleeding noted.   Goal of Therapy:  Heparin level 0.3-0.7 units/ml aPTT 66-102 seconds Monitor platelets by anticoagulation protocol: Yes   Plan:  - Continue Heparin at 1100 units/hr (11 ml/hr) - Will continue to monitor for any signs/symptoms of bleeding and will follow up with heparin level in the a.m to confirm therapeutic  Thank you for allowing pharmacy to be a part of this patient's care.  Alycia Rossetti, PharmD, BCPS Clinical Pharmacist Clinical phone for 01/29/2019: [phone] 01/29/2019 5:05 PM   **Pharmacist phone directory can now be found on [email or web address] ([email or web address]).  Listed under [place].

## 2019-01-29 NOTE — Care Management Important Message (Signed)
Important Message  Patient Details  Name: Stephanie Woodward MRN: [id number] Date of Birth: 12-19-1940   Medicare Important Message Given:  Yes     Orbie Pyo 01/29/2019, 4:14 PM

## 2019-01-29 NOTE — Progress Notes (Addendum)
PROGRESS NOTE    Stephanie Woodward  FFM:[id number] DOB: [birth year] DOA: 01/23/2019 PCP: Alroy Dust, L.Marlou Sa, MD     Brief Narrative:  Stephanie Woodward is a 78 y.o. female with medical history significant of HTN, HLD, CHF, IDDM, solitary kidney, chronic kidney disease stage IV followed by Dr. Clover Mealy, chronic atrial fibrillation on Xarelto.  She presents with complaints of nausea and vomiting.  Patient reports being nauseated for quite some time, but started having intermittent vomiting 4 days ago.  Emesis is noted to be nonbloody and nonbilious and appears.  Associated symptoms include lower abdominal cramping and worsening memory.  Daughter notes that the patient diabetes has been wildly uncontrolled with blood sugars anywhere from 42->400.  She was finally able to get set up with the endocrinologist and has an appointment sometime next week.  She was seen by her nephrologist 3-4 weeks ago and started on metolazone.  Patient had a fistula placed at the end of September 2020 by Dr. Carlis Abbott vascular surgery, but it hasn't fully matured. Nephrology was consulted.  Patient underwent tunneled dialysis catheter placement by vascular surgery on 12/4.  Started first dialysis session 12/5.  New events last 24 hours / Subjective: In good spirits, doing well.  Second dialysis session planned for later today  Assessment & Plan:   Principal Problem:   Acute renal failure superimposed on chronic kidney disease ([place]) Active Problems:   CHF (congestive heart failure) (HCC)   Essential hypertension   Diabetes mellitus with complication (HCC)   Leukocytosis   Hypokalemia   Atrial fibrillation, chronic (HCC)   Chronic anticoagulation   AKI on CKD stage IV -At baseline patient's creatinine previously had been around 2.  Patient presents with creatinine elevated up to 4.82 with a BUN 130.  Suspect prerenal cause of as patient had recently started diuretics and has also had N/V, but there is concern for patient  becoming uremic -Status post tunneled dialysis catheter placement 12/4 -Started inpatient dialysis 12/5.  Outpatient dialysis unit placement pending  Nausea and vomiting -Appears likely multifactorial given worsening kidney function with BUN elevated  -Resolved, now tolerating diet without any issues  Prolonged QT interval -QTc on admission noted to be 510 -Correct electrolyte abnormalities -Avoid QT prolonging medication -Recheck QTc 484  Persistent atrial fibrillation on chronic anticoagulation -Held Xarelto due to kidney function -Heparin drip per pharmacy for now, transition to ?Eliquis  Chronic combined systolic and diastolic CHF -Last EF noted to be 45 to 50% with grade 2 diastolic dysfunction in 57/0177  Patient does not clinically appear fluid overloaded at this time  Essential hypertension -Continue metoprolol, hydralazine  CAD -Continue imdur   Diabetes mellitus type 2 with hyperglycemia -Hemoglobin A1c 8.9  -Lantus, novolog SSI.  Increase Lantus today.  Remains on mealtime NovoLog as well -Diabetic coordinator consult  -Avoid hypoglycemia   Hyperlipidemia -Continue atorvastatin  GERD -Hold Protonix due to prolonged QT  Left foot pain -X-ray negative for fracture or dislocation -Suspect tendinitis, supportive care -Improved   DVT prophylaxis: IV heparin (on Xarelto PTA) Code Status: Full Family Communication: None at bedside Disposition Plan: Continue inpatient dialysis, process initiated for outpatient dialysis unit placement   Consultants:   Nephrology  Vascular surgery  Procedures:   Tunneled dialysis catheter placed 12/4  Antimicrobials:  Anti-infectives (From admission, onward)   None       Objective: Vitals:   01/28/19 1951 01/29/19 0300 01/29/19 0423 01/29/19 0947  BP: (!) 125/103  (!) 142/56 (!) 128/42  Pulse: 69  85  67  Resp: 20  16 20   Temp: 98.7 F (37.1 C)  98.8 F (37.1 C) 98.7 F (37.1 C)  TempSrc: Oral   Oral Oral  SpO2: 93%  94% 95%  Weight:  92.3 kg    Height:        Intake/Output Summary (Last 24 hours) at 01/29/2019 1015 Last data filed at 01/29/2019 7622 Gross per 24 hour  Intake 1650.87 ml  Output 600 ml  Net 1050.87 ml   Filed Weights   01/27/19 1533 01/28/19 0300 01/29/19 0300  Weight: 90.5 kg 89.3 kg 92.3 kg    Examination: General exam: Appears calm and comfortable  Respiratory system: Clear to auscultation. Respiratory effort normal. Cardiovascular system: S1 & S2 heard, RRR. No pedal edema. Gastrointestinal system: Abdomen is nondistended, soft and nontender. Normal bowel sounds heard. Central nervous system: Alert and oriented. Non focal exam. Speech clear  Extremities: Symmetric in appearance bilaterally  Skin: No rashes, lesions or ulcers on exposed skin  Psychiatry: Judgement and insight appear stable. Mood & affect appropriate.    Data Reviewed: I have personally reviewed following labs and imaging studies  CBC: Recent Labs  Lab 01/24/19 0922 01/25/19 0323 01/26/19 0216 01/27/19 0447 01/28/19 0550 01/29/19 0515  WBC 14.2* 14.4* 12.3* 12.2* 13.2* 14.0*  NEUTROABS 11.3*  --   --   --   --   --   HGB 13.7 12.5 11.4* 11.1* 11.0* 11.0*  HCT 43.3 38.8 37.2 35.9* 36.0 36.1  MCV 85.7 84.2 87.3 88.0 88.5 89.8  PLT 351 309 283 253 229 633   Basic Metabolic Panel: Recent Labs  Lab 01/24/19 0922 01/25/19 0323 01/25/19 1201 01/26/19 0216 01/27/19 0447 01/28/19 0550 01/29/19 0515  NA  --  138  --  135 140 138 138  K  --  2.7*  --  3.3* 3.7 3.9 4.0  CL  --  90*  --  92* 96* 101 100  CO2  --  33*  --  29 30 26 26   GLUCOSE  --  181*  --  304* 296* 248* 249*  BUN  --  127*  --  116* 107* 62* 70*  CREATININE  --  4.55*  --  4.12* 3.77* 3.17* 3.65*  CALCIUM  --  8.0*  --  8.1* 8.3* 8.3* 8.8*  MG 1.9  --  1.7 1.8  --   --   --   PHOS  --  4.3  --   --   --   --   --    GFR: Estimated Creatinine Clearance: 14.2 mL/min (A) (by C-G formula based on SCr of  3.65 mg/dL (H)). Liver Function Tests: Recent Labs  Lab 01/24/19 0820 01/25/19 0323  AST 26  --   ALT 11  --   ALKPHOS 101  --   BILITOT 1.1  --   PROT 7.2  --   ALBUMIN 3.1* 2.6*   No results for input(s): LIPASE, AMYLASE in the last 168 hours. No results for input(s): AMMONIA in the last 168 hours. Coagulation Profile: No results for input(s): INR, PROTIME in the last 168 hours. Cardiac Enzymes: Recent Labs  Lab 01/24/19 1729  CKTOTAL 100   BNP (last 3 results) No results for input(s): PROBNP in the last 8760 hours. HbA1C: No results for input(s): HGBA1C in the last 72 hours. CBG: Recent Labs  Lab 01/28/19 0700 01/28/19 1109 01/28/19 1525 01/28/19 2032 01/29/19 0702  GLUCAP 268* 305* 164* 187* 231*   Lipid Profile:  No results for input(s): CHOL, HDL, LDLCALC, TRIG, CHOLHDL, LDLDIRECT in the last 72 hours. Thyroid Function Tests: No results for input(s): TSH, T4TOTAL, FREET4, T3FREE, THYROIDAB in the last 72 hours. Anemia Panel: No results for input(s): VITAMINB12, FOLATE, FERRITIN, TIBC, IRON, RETICCTPCT in the last 72 hours. Sepsis Labs: No results for input(s): PROCALCITON, LATICACIDVEN in the last 168 hours.  Recent Results (from the past 240 hour(s))  Novel Coronavirus, NAA (Hosp order, Send-out to Ref Lab; TAT 18-24 hrs     Status: None   Collection Time: 01/23/19  5:38 PM   Specimen: Nasopharyngeal Swab; Respiratory  Result Value Ref Range Status   SARS-CoV-2, NAA NOT DETECTED NOT DETECTED Final    Comment: (NOTE) This nucleic acid amplification test was developed and its performance characteristics determined by [employer]. Nucleic acid amplification tests include PCR and TMA. This test has not been FDA cleared or approved. This test has been authorized by FDA under an Emergency Use Authorization (EUA). This test is only authorized for the duration of time the declaration that circumstances exist justifying the authorization of the  emergency use of in vitro diagnostic tests for detection of SARS-CoV-2 virus and/or diagnosis of COVID-19 infection under section 564(b)(1) of the Act, 21 U.S.C. 834HDQ-2(I) (1), unless the authorization is terminated or revoked sooner. When diagnostic testing is negative, the possibility of a false negative result should be considered in the context of a patient's recent exposures and the presence of clinical signs and symptoms consistent with COVID-19. An individual without symptoms of COVID- 19 and who is not shedding SARS-CoV-2 vi rus would expect to have a negative (not detected) result in this assay. Performed At: [facility] [address] [place], [place] [id number] Rush Farmer MD HE:[id number]    [place]  Final    Comment: Performed at [facility], [place] [address] [place], [place] [address]  SARS CORONAVIRUS 2 (TAT 6-24 HRS) Nasopharyngeal Nasopharyngeal Swab     Status: None   Collection Time: 01/24/19 12:19 PM   Specimen: Nasopharyngeal Swab  Result Value Ref Range Status   SARS Coronavirus 2 NEGATIVE NEGATIVE Final    Comment: (NOTE) SARS-CoV-2 target nucleic acids are NOT DETECTED. The SARS-CoV-2 RNA is generally detectable in upper and lower respiratory specimens during the acute phase of infection. Negative results do not preclude SARS-CoV-2 infection, do not rule out co-infections with other pathogens, and should not be used as the sole basis for treatment or other patient management decisions. Negative results must be combined with clinical observations, patient history, and epidemiological information. The expected result is Negative. Fact Sheet for Patients: SugarRoll.be Fact Sheet for Healthcare Providers: https://www.woods-mathews.com/ This test is not yet approved or cleared by the [place] FDA and  has been authorized for detection and/or diagnosis of SARS-CoV-2  by FDA under an Emergency Use Authorization (EUA). This EUA will remain  in effect (meaning this test can be used) for the duration of the COVID-19 declaration under Section 56 4(b)(1) of the Act, 21 U.S.C. section 360bbb-3(b)(1), unless the authorization is terminated or revoked sooner. Performed at [facility], [place] [address], [place],  [address]   Surgical PCR screen     Status: None   Collection Time: 01/25/19  5:03 PM   Specimen: Nasal Mucosa; Nasal Swab  Result Value Ref Range Status   MRSA, PCR NEGATIVE NEGATIVE Final   Staphylococcus aureus NEGATIVE NEGATIVE Final    Comment: (NOTE) The Xpert SA Assay (FDA approved for  NASAL specimens in patients 87 years of age and older), is one component of a comprehensive surveillance program. It is not intended to diagnose infection nor to guide or monitor treatment. Performed at [facility], [place] [address], [place], [place] [address]       Radiology Studies: No results found.    Scheduled Meds:  atorvastatin  80 mg Oral QPM   calcitRIOL  0.25 mcg Oral Daily   Chlorhexidine Gluconate Cloth  6 each Topical Daily   hydrALAZINE  100 mg Oral TID   insulin aspart  0-6 Units Subcutaneous TID WC   insulin aspart  2 Units Subcutaneous TID WC   insulin glargine  30 Units Subcutaneous Daily   isosorbide mononitrate  30 mg Oral Daily   latanoprost  1 drop Both Eyes QHS   metoprolol succinate  25 mg Oral QHS   metoprolol succinate  50 mg Oral Daily   sodium chloride flush  3 mL Intravenous Once   sodium chloride flush  3 mL Intravenous Q12H   Continuous Infusions:  heparin 1,100 Units/hr (01/29/19 0803)     LOS: 5 days      Time spent: 20 minutes   Dessa Phi, DO [facility] 01/29/2019, 10:15 AM   Available via Epic secure chat 7am-7pm After these hours, please refer to coverage provider listed on [email or web address]

## 2019-01-30 LAB — CBC
HCT: 37.9 % (ref 36.0–46.0)
Hemoglobin: 11.7 g/dL — ABNORMAL LOW (ref 12.0–15.0)
MCH: 27.4 pg (ref 26.0–34.0)
MCHC: 30.9 g/dL (ref 30.0–36.0)
MCV: 88.8 fL (ref 80.0–100.0)
Platelets: 245 10*3/uL (ref 150–400)
RBC: 4.27 MIL/uL (ref 3.87–5.11)
RDW: 14.7 % (ref 11.5–15.5)
WBC: 18.2 10*3/uL — ABNORMAL HIGH (ref 4.0–10.5)
nRBC: 0 % (ref 0.0–0.2)

## 2019-01-30 LAB — GLUCOSE, CAPILLARY
Glucose-Capillary: 291 mg/dL — ABNORMAL HIGH (ref 70–99)
Glucose-Capillary: 266 mg/dL — ABNORMAL HIGH (ref 70–99)
Glucose-Capillary: 68 mg/dL — ABNORMAL LOW (ref 70–99)
Glucose-Capillary: 147 mg/dL — ABNORMAL HIGH (ref 70–99)
Glucose-Capillary: 191 mg/dL — ABNORMAL HIGH (ref 70–99)
Glucose-Capillary: 161 mg/dL — ABNORMAL HIGH (ref 70–99)

## 2019-01-30 LAB — BASIC METABOLIC PANEL
Anion gap: 11 (ref 5–15)
BUN: 38 mg/dL — ABNORMAL HIGH (ref 8–23)
CO2: 26 mmol/L (ref 22–32)
Calcium: 8.9 mg/dL (ref 8.9–10.3)
Chloride: 99 mmol/L (ref 98–111)
Creatinine, Ser: 2.74 mg/dL — ABNORMAL HIGH (ref 0.44–1.00)
GFR calc Af Amer: 19 mL/min — ABNORMAL LOW (ref >60)
GFR calc non Af Amer: 16 mL/min — ABNORMAL LOW (ref >60)
Glucose, Bld: 270 mg/dL — ABNORMAL HIGH (ref 70–99)
Potassium: 4.5 mmol/L (ref 3.5–5.1)
Sodium: 136 mmol/L (ref 135–145)

## 2019-01-30 LAB — HEPARIN LEVEL (UNFRACTIONATED): Heparin Unfractionated: 0.29 IU/mL — ABNORMAL LOW (ref 0.30–0.70)

## 2019-01-30 LAB — PARATHYROID HORMONE, INTACT (NO CA): PTH: 137 pg/mL — ABNORMAL HIGH (ref 15–65)

## 2019-01-30 MED ORDER — HEPARIN SODIUM (PORCINE) 1000 UNIT/ML IJ SOLN
INTRAMUSCULAR | Status: AC
  Administered 2019-01-30: 3400 [IU]
  Filled 2019-01-30: qty 4

## 2019-01-30 MED ORDER — CALCITRIOL 0.25 MCG PO CAPS
ORAL_CAPSULE | ORAL | Status: AC
  Administered 2019-01-30: 0.25 ug via ORAL
  Filled 2019-01-30: qty 1

## 2019-01-30 NOTE — Progress Notes (Signed)
PROGRESS NOTE    Stephanie Woodward  LPF:[id number] DOB: 1940-08-20 DOA: 01/23/2019 PCP: Alroy Dust, L.Marlou Sa, MD     Brief Narrative:  Stephanie Woodward is a 78 y.o. female with medical history significant of HTN, HLD, CHF, IDDM, solitary kidney, chronic kidney disease stage IV followed by Dr. Clover Mealy, chronic atrial fibrillation on Xarelto.  She presents with complaints of nausea and vomiting.  Patient reports being nauseated for quite some time, but started having intermittent vomiting 4 days ago.  Emesis is noted to be nonbloody and nonbilious and appears.  Associated symptoms include lower abdominal cramping and worsening memory.  Daughter notes that the patient diabetes has been wildly uncontrolled with blood sugars anywhere from 42->400.  She was finally able to get set up with the endocrinologist and has an appointment sometime next week.  She was seen by her nephrologist 3-4 weeks ago and started on metolazone.  Patient had a fistula placed at the end of September 2020 by Dr. Carlis Abbott vascular surgery, but it hasn't fully matured. Nephrology was consulted.  Patient underwent tunneled dialysis catheter placement by vascular surgery on 12/4.  Started first dialysis session 12/5.  New events last 24 hours / Subjective: No new complaints this morning, reports that she is feeling well overall  Assessment & Plan:   Principal Problem:   Acute renal failure superimposed on chronic kidney disease ([place]) Active Problems:   CHF (congestive heart failure) (HCC)   Essential hypertension   Diabetes mellitus with complication (HCC)   Leukocytosis   Hypokalemia   Atrial fibrillation, chronic (HCC)   Chronic anticoagulation   AKI on CKD stage IV -At baseline patient's creatinine previously had been around 2.  Patient presents with creatinine elevated up to 4.82 with a BUN 130.  Suspect prerenal cause of as patient had recently started diuretics and has also had N/V, but there is concern for patient becoming  uremic -Status post tunneled dialysis catheter placement 12/4 -Started inpatient dialysis 12/5.  Outpatient dialysis unit placement pending  Nausea and vomiting -Appears likely multifactorial given worsening kidney function with BUN elevated  -Resolved, now tolerating diet without any issues  Prolonged QT interval -QTc on admission noted to be 510 -Correct electrolyte abnormalities -Avoid QT prolonging medication -Recheck QTc 484  Persistent atrial fibrillation on chronic anticoagulation -Held Xarelto due to kidney function -Heparin drip per pharmacy for now, transition to ?Eliquis  Chronic combined systolic and diastolic CHF -Last EF noted to be 45 to 50% with grade 2 diastolic dysfunction in 53/2992  Patient does not clinically appear fluid overloaded at this time  Essential hypertension -Continue metoprolol, hydralazine  CAD -Continue imdur   Diabetes mellitus type 2 with hyperglycemia -Hemoglobin A1c 8.9  -Lantus, novolog SSI.  Increase Lantus today.  Remains on mealtime NovoLog as well -Diabetic coordinator consult  -Avoid hypoglycemia   Hyperlipidemia -Continue atorvastatin  GERD -Hold Protonix due to prolonged QT  Left foot pain -X-ray negative for fracture or dislocation -Suspect tendinitis, supportive care -Improved   DVT prophylaxis: IV heparin (on Xarelto PTA).  Transition to renally dosed Eliquis when okay with nephrology Code Status: Full Family Communication: None at bedside Disposition Plan: Continue inpatient dialysis, process initiated for outpatient dialysis unit placement   Consultants:   Nephrology  Vascular surgery  Procedures:   Tunneled dialysis catheter placed 12/4  Antimicrobials:  Anti-infectives (From admission, onward)   None       Objective: Vitals:   01/29/19 2131 01/30/19 0500 01/30/19 0927 01/30/19 0927  BP: (!) 143/54 138/62 Marland Kitchen)  122/49 (!) 122/49  Pulse: 82 88 (!) 59 (!) 59  Resp: 16 16 16 18   Temp: 99  F (37.2 C) 98.7 F (37.1 C) 98.7 F (37.1 C) 98.7 F (37.1 C)  TempSrc: Oral Oral Oral Oral  SpO2: 93% 95% 98% 98%  Weight:      Height:        Intake/Output Summary (Last 24 hours) at 01/30/2019 1104 Last data filed at 01/30/2019 0601 Gross per 24 hour  Intake 411.09 ml  Output 1800 ml  Net -1388.91 ml   Filed Weights   01/29/19 0300 01/29/19 1455 01/29/19 1802  Weight: 92.3 kg 92.5 kg 90.7 kg    Examination: General exam: Appears calm and comfortable  Respiratory system: Clear to auscultation. Respiratory effort normal. Cardiovascular system: S1 & S2 heard. No pedal edema. Gastrointestinal system: Abdomen is nondistended, soft and nontender. Normal bowel sounds heard. Central nervous system: Alert and oriented. Non focal exam. Speech clear  Extremities: Symmetric in appearance bilaterally  Skin: No rashes, lesions or ulcers on exposed skin  Psychiatry: Judgement and insight appear stable. Mood & affect appropriate.    Data Reviewed: I have personally reviewed following labs and imaging studies  CBC: Recent Labs  Lab 01/24/19 0922  01/26/19 0216 01/27/19 0447 01/28/19 0550 01/29/19 0515 01/30/19 0627  WBC 14.2*   < > 12.3* 12.2* 13.2* 14.0* 18.2*  NEUTROABS 11.3*  --   --   --   --   --   --   HGB 13.7   < > 11.4* 11.1* 11.0* 11.0* 11.7*  HCT 43.3   < > 37.2 35.9* 36.0 36.1 37.9  MCV 85.7   < > 87.3 88.0 88.5 89.8 88.8  PLT 351   < > 283 253 229 251 245   < > = values in this interval not displayed.   Basic Metabolic Panel: Recent Labs  Lab 01/24/19 0922 01/25/19 0323 01/25/19 1201 01/26/19 0216 01/27/19 0447 01/28/19 0550 01/29/19 0515  NA  --  138  --  135 140 138 138  K  --  2.7*  --  3.3* 3.7 3.9 4.0  CL  --  90*  --  92* 96* 101 100  CO2  --  33*  --  29 30 26 26   GLUCOSE  --  181*  --  304* 296* 248* 249*  BUN  --  127*  --  116* 107* 62* 70*  CREATININE  --  4.55*  --  4.12* 3.77* 3.17* 3.65*  CALCIUM  --  8.0*  --  8.1* 8.3* 8.3* 8.8*  MG  1.9  --  1.7 1.8  --   --   --   PHOS  --  4.3  --   --   --   --   --    GFR: Estimated Creatinine Clearance: 14 mL/min (A) (by C-G formula based on SCr of 3.65 mg/dL (H)). Liver Function Tests: Recent Labs  Lab 01/24/19 0820 01/25/19 0323  AST 26  --   ALT 11  --   ALKPHOS 101  --   BILITOT 1.1  --   PROT 7.2  --   ALBUMIN 3.1* 2.6*   No results for input(s): LIPASE, AMYLASE in the last 168 hours. No results for input(s): AMMONIA in the last 168 hours. Coagulation Profile: No results for input(s): INR, PROTIME in the last 168 hours. Cardiac Enzymes: Recent Labs  Lab 01/24/19 1729  CKTOTAL 100   BNP (last 3 results) No  results for input(s): PROBNP in the last 8760 hours. HbA1C: No results for input(s): HGBA1C in the last 72 hours. CBG: Recent Labs  Lab 01/29/19 0702 01/29/19 1125 01/29/19 1846 01/29/19 2133 01/30/19 0651  GLUCAP 231* 136* 112* 267* 291*   Lipid Profile: No results for input(s): CHOL, HDL, LDLCALC, TRIG, CHOLHDL, LDLDIRECT in the last 72 hours. Thyroid Function Tests: No results for input(s): TSH, T4TOTAL, FREET4, T3FREE, THYROIDAB in the last 72 hours. Anemia Panel: No results for input(s): VITAMINB12, FOLATE, FERRITIN, TIBC, IRON, RETICCTPCT in the last 72 hours. Sepsis Labs: No results for input(s): PROCALCITON, LATICACIDVEN in the last 168 hours.  Recent Results (from the past 240 hour(s))  Novel Coronavirus, NAA (Hosp order, Send-out to Ref Lab; TAT 18-24 hrs     Status: None   Collection Time: 01/23/19  5:38 PM   Specimen: Nasopharyngeal Swab; Respiratory  Result Value Ref Range Status   SARS-CoV-2, NAA NOT DETECTED NOT DETECTED Final    Comment: (NOTE) This nucleic acid amplification test was developed and its performance characteristics determined by [employer]. Nucleic acid amplification tests include PCR and TMA. This test has not been FDA cleared or approved. This test has been authorized by FDA under an Emergency Use  Authorization (EUA). This test is only authorized for the duration of time the declaration that circumstances exist justifying the authorization of the emergency use of in vitro diagnostic tests for detection of SARS-CoV-2 virus and/or diagnosis of COVID-19 infection under section 564(b)(1) of the Act, 21 U.S.C. 151VOH-6(W) (1), unless the authorization is terminated or revoked sooner. When diagnostic testing is negative, the possibility of a false negative result should be considered in the context of a patient's recent exposures and the presence of clinical signs and symptoms consistent with COVID-19. An individual without symptoms of COVID- 19 and who is not shedding SARS-CoV-2 vi rus would expect to have a negative (not detected) result in this assay. Performed At: [facility] [address] [place], [place] [id number] Rush Farmer MD SW:[id number]    [place]  Final    Comment: Performed at [facility], [place] [address] [place], [place] [address]  SARS CORONAVIRUS 2 (TAT 6-24 HRS) Nasopharyngeal Nasopharyngeal Swab     Status: None   Collection Time: 01/24/19 12:19 PM   Specimen: Nasopharyngeal Swab  Result Value Ref Range Status   SARS Coronavirus 2 NEGATIVE NEGATIVE Final    Comment: (NOTE) SARS-CoV-2 target nucleic acids are NOT DETECTED. The SARS-CoV-2 RNA is generally detectable in upper and lower respiratory specimens during the acute phase of infection. Negative results do not preclude SARS-CoV-2 infection, do not rule out co-infections with other pathogens, and should not be used as the sole basis for treatment or other patient management decisions. Negative results must be combined with clinical observations, patient history, and epidemiological information. The expected result is Negative. Fact Sheet for Patients: SugarRoll.be Fact Sheet for Healthcare Providers:  https://www.woods-mathews.com/ This test is not yet approved or cleared by the [place] FDA and  has been authorized for detection and/or diagnosis of SARS-CoV-2 by FDA under an Emergency Use Authorization (EUA). This EUA will remain  in effect (meaning this test can be used) for the duration of the COVID-19 declaration under Section 56 4(b)(1) of the Act, 21 U.S.C. section 360bbb-3(b)(1), unless the authorization is terminated or revoked sooner. Performed at [facility], [place] [address] [place], [place] [address]   Surgical PCR screen     Status: None  Collection Time: 01/25/19  5:03 PM   Specimen: Nasal Mucosa; Nasal Swab  Result Value Ref Range Status   MRSA, PCR NEGATIVE NEGATIVE Final   Staphylococcus aureus NEGATIVE NEGATIVE Final    Comment: (NOTE) The Xpert SA Assay (FDA approved for NASAL specimens in patients 30 years of age and older), is one component of a comprehensive surveillance program. It is not intended to diagnose infection nor to guide or monitor treatment. Performed at [facility], [place] [address] [place], [place] [address]       Radiology Studies: No results found.    Scheduled Meds: . atorvastatin  80 mg Oral QPM  . calcitRIOL  0.25 mcg Oral Daily  . Chlorhexidine Gluconate Cloth  6 each Topical Daily  . hydrALAZINE  100 mg Oral TID  . insulin aspart  0-6 Units Subcutaneous TID WC  . insulin aspart  2 Units Subcutaneous TID WC  . insulin glargine  30 Units Subcutaneous Daily  . isosorbide mononitrate  30 mg Oral Daily  . latanoprost  1 drop Both Eyes QHS  . metoprolol succinate  25 mg Oral QHS  . metoprolol succinate  50 mg Oral Daily  . sodium chloride flush  3 mL Intravenous Once  . sodium chloride flush  3 mL Intravenous Q12H   Continuous Infusions: . heparin 1,200 Units/hr (01/30/19 0745)     LOS: 6 days      Time spent: 20 minutes   Dessa Phi, DO [facility] 01/30/2019, 11:04 AM    Available via Epic secure chat 7am-7pm After these hours, please refer to coverage provider listed on [email or web address]

## 2019-01-30 NOTE — Progress Notes (Signed)
CRITICAL VALUE ALERT  Critical Value: CBG:68  Date & Time Notied: 01/30/19 1826  Orders Received/Actions taken: Carb snack given. No symptoms reported. CBG now WNL. Will continue to monitor.

## 2019-01-30 NOTE — Progress Notes (Signed)
Renal Navigator met with patient at HD bedside to introduce self and discuss OP HD referral. She requested to call her daughter for this conversation and put her on speaker phone. Navigator agreed and also offered to have this discussion tomorrow in patient's room. She would like to talk now. Navigator explained OP HD schedule and nearest clinic to patient's home. Navigator submitted referral to [facility] to see if they have an opening to accommodate at this time (referral will be received by Admissions Coordinator tomorrow by this point). She prefers second shift, any day if possible. Patient will need assistance with SCAT application for transportation. Renal Navigator will follow up tomorrow with application. Renal Navigator notified [place] of referral coming in. Renal Navigator will follow closely.  Alphonzo Cruise, [place] Renal Navigator [phone]

## 2019-01-30 NOTE — Procedures (Signed)
Patient seen on Hemodialysis. BP (!) 103/40   Pulse 69   Temp 98.2 F (36.8 C) (Oral)   Resp 18   Ht 5\' 3"  (1.6 m)   Wt 91.6 kg   SpO2 96%   BMI 35.77 kg/m   QB 300, UF goal 2L Tolerating treatment without complaints at this time.   Elmarie Shiley MD [facility]. Office # [phone] Pager # [phone] 4:29 PM

## 2019-01-30 NOTE — Progress Notes (Signed)
ANTICOAGULATION CONSULT NOTE   Pharmacy Consult for Heparin Indication: atrial fibrillation  Patient Measurements: Height: 5\' 3"  (160 cm) Weight: 199 lb 15.3 oz (Stephanie.7 kg)(standing wt) IBW/kg (Calculated) : 52.4 Heparin Dosing Weight: 74.4kg  Vital Signs: Temp: 98.7 F (37.1 C) (12/08 0500) Temp Source: Oral (12/08 0500) BP: 138/62 (12/08 0500) Pulse Rate: 88 (12/08 0500)  Labs: Recent Labs    01/28/19 0550 01/29/19 0515 01/29/19 1633 01/30/19 0627  HGB 11.0* 11.0*  --  11.7*  HCT 36.0 36.1  --  37.9  PLT 229 251  --  245  HEPARINUNFRC 0.54 0.26* 0.42 0.29*  CREATININE 3.17* 3.65*  --   --     Estimated Creatinine Clearance: 14 mL/min (A) (by C-G formula based on SCr of 3.65 mg/dL (H)).  Assessment: 78 y.o. Woodward with h/o Afib, PTA Xarelto on hold, for heparin in the interim.  Pt is s/p VVS placement of TDC for hemodialysis on 12/4.  Heparin level this morning is SUBtherapeutic at 0.29 (goal of 0.3-0.7). No bleeding noted.   Goal of Therapy:  Heparin level 0.3-0.7 units/ml aPTT 66-102 seconds Monitor platelets by anticoagulation protocol: Yes   Plan:  - Increase Heparin to 1200 units/hr (12 ml/hr) - Will continue to monitor for any signs/symptoms of bleeding  Samanvi Cuccia A. Levada Dy, PharmD, BCPS, FNKF Clinical Pharmacist [place] Please utilize Amion for appropriate phone number to reach the unit pharmacist ([place])

## 2019-01-30 NOTE — Progress Notes (Signed)
Patient ID: Stephanie Woodward, female   DOB: Nov 11, 1940, 78 y.o.   MRN: [id number] [facility] Progress Note   Assessment/ Plan:   1.  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  With immature left BCF and now status post right IJ TDC.  Underwent first hemodialysis on 12/5, she will undergo her third dialysis treatment today as we await outpatient dialysis unit placement. 2.  Hypertension: Blood pressures currently under good control, will continue to follow with hemodialysis. 3.  Moderate to severe pulmonary hypertension: With combined systolic/diastolic CHF and currently appears to be compensated-we will continue to follow with UF on HD 4.  Paroxysmal atrial fibrillation: Appears to be rate controlled at this time.  On heparin drip and may potentially be able to begin anticoagulation with Eliquis at lower renally adjusted dose.  5.  Secondary hyperparathyroidism: Calcium and phosphorus level within acceptable range, will follow up on recent PTH level. 6.  Anemia of chronic kidney disease: Hemoglobin/hematocrit currently at goal without overt loss postoperatively.  Subjective:   Slept poorly overnight, trying to catch up at this time.  Underwent hemodialysis without problem.   Objective:   BP 138/62 (BP Location: Right Arm)   Pulse 88   Temp 98.7 F (37.1 C) (Oral)   Resp 16   Ht 5\' 3"  (1.6 m)   Wt 90.7 kg Comment: standing wt  SpO2 95%   BMI 35.42 kg/m   Intake/Output Summary (Last 24 hours) at 01/30/2019 0918 Last data filed at 01/30/2019 0601 Gross per 24 hour  Intake 461.09 ml  Output 1800 ml  Net -1338.91 ml   Weight change: 0.2 kg  Physical Exam: Gen: Comfortably sleeping hunched over in her recliner CVS: Pulse regular rhythm, normal rate, S1 and S2 normal Resp: Clear to auscultation without rales or rhonchi.  Right IJ TDC in situ Abd: Soft,  flat, nontender Ext: Nonpitting lower extremity edema, left upper arm BCF with palpable thrill  Imaging: No results found.  Labs: BMET Recent Labs  Lab 01/23/19 2157 01/24/19 0820 01/25/19 0323 01/26/19 0216 01/27/19 0447 01/28/19 0550 01/29/19 0515  NA 133* 136 138 135 140 138 138  K 3.0* 4.2 2.7* 3.3* 3.7 3.9 4.0  CL 84* 86* 90* 92* 96* 101 100  CO2 33* 33* 33* 29 30 26 26   GLUCOSE 486* 174* 181* 304* 296* 248* 249*  BUN 130* 129* 127* 116* 107* 62* 70*  CREATININE 4.82* 4.69* 4.55* 4.12* 3.77* 3.17* 3.65*  CALCIUM 8.0* 8.4* 8.0* 8.1* 8.3* 8.3* 8.8*  PHOS  --   --  4.3  --   --   --   --    CBC Recent Labs  Lab 01/24/19 0922  01/27/19 0447 01/28/19 0550 01/29/19 0515 01/30/19 0627  WBC 14.2*   < > 12.2* 13.2* 14.0* 18.2*  NEUTROABS 11.3*  --   --   --   --   --   HGB 13.7   < > 11.1* 11.0* 11.0* 11.7*  HCT 43.3   < > 35.9* 36.0 36.1 37.9  MCV 85.7   < > 88.0 88.5 89.8 88.8  PLT 351   < > 253 229 251 245   < > = values in this interval not displayed.    Medications:    . atorvastatin  80 mg Oral QPM  . calcitRIOL  0.25 mcg Oral Daily  . Chlorhexidine Gluconate Cloth  6 each  Topical Daily  . hydrALAZINE  100 mg Oral TID  . insulin aspart  0-6 Units Subcutaneous TID WC  . insulin aspart  2 Units Subcutaneous TID WC  . insulin glargine  30 Units Subcutaneous Daily  . isosorbide mononitrate  30 mg Oral Daily  . latanoprost  1 drop Both Eyes QHS  . metoprolol succinate  25 mg Oral QHS  . metoprolol succinate  50 mg Oral Daily  . sodium chloride flush  3 mL Intravenous Once  . sodium chloride flush  3 mL Intravenous Q12H   Elmarie Shiley, MD 01/30/2019, 9:18 AM

## 2019-01-30 NOTE — Progress Notes (Signed)
Renal Navigator received message from [facility] Admissions Coordinator that patient has been financially cleared for acceptance at OP HD clinic, however, a copy of her insurance card is needed for her file. Patient is in HD unit at this time, but Renal Navigator will ask patient to obtain copy tomorrow when she is not on HD. Navigator awaits seat schedule.  Alphonzo Cruise, [place] Renal Navigator [phone]

## 2019-01-31 ENCOUNTER — Other Ambulatory Visit: Payer: Self-pay

## 2019-01-31 DIAGNOSIS — M79672 Pain in left foot: Secondary | ICD-10-CM

## 2019-01-31 LAB — CBC
HCT: 37.3 % (ref 36.0–46.0)
Hemoglobin: 11.4 g/dL — ABNORMAL LOW (ref 12.0–15.0)
MCH: 27.1 pg (ref 26.0–34.0)
MCHC: 30.6 g/dL (ref 30.0–36.0)
MCV: 88.8 fL (ref 80.0–100.0)
Platelets: 230 10*3/uL (ref 150–400)
RBC: 4.2 MIL/uL (ref 3.87–5.11)
RDW: 14.7 % (ref 11.5–15.5)
WBC: 18 10*3/uL — ABNORMAL HIGH (ref 4.0–10.5)
nRBC: 0 % (ref 0.0–0.2)

## 2019-01-31 LAB — BASIC METABOLIC PANEL
Anion gap: 9 (ref 5–15)
BUN: 21 mg/dL (ref 8–23)
CO2: 27 mmol/L (ref 22–32)
Calcium: 8.8 mg/dL — ABNORMAL LOW (ref 8.9–10.3)
Chloride: 98 mmol/L (ref 98–111)
Creatinine, Ser: 2.58 mg/dL — ABNORMAL HIGH (ref 0.44–1.00)
GFR calc Af Amer: 20 mL/min — ABNORMAL LOW (ref >60)
GFR calc non Af Amer: 17 mL/min — ABNORMAL LOW (ref >60)
Glucose, Bld: 239 mg/dL — ABNORMAL HIGH (ref 70–99)
Potassium: 4 mmol/L (ref 3.5–5.1)
Sodium: 134 mmol/L — ABNORMAL LOW (ref 135–145)

## 2019-01-31 LAB — GLUCOSE, CAPILLARY
Glucose-Capillary: 290 mg/dL — ABNORMAL HIGH (ref 70–99)
Glucose-Capillary: 261 mg/dL — ABNORMAL HIGH (ref 70–99)
Glucose-Capillary: 208 mg/dL — ABNORMAL HIGH (ref 70–99)

## 2019-01-31 LAB — HEPARIN LEVEL (UNFRACTIONATED): Heparin Unfractionated: 0.38 IU/mL (ref 0.30–0.70)

## 2019-01-31 MED ORDER — APIXABAN 5 MG PO TABS: 5.0000 mg | ORAL_TABLET | Freq: Two times a day (BID) | ORAL | 0 refills | Status: DC

## 2019-01-31 MED ORDER — APIXABAN 5 MG PO TABS
5.0000 mg | ORAL_TABLET | Freq: Two times a day (BID) | ORAL | Status: DC
  Administered 2019-01-31: 5 mg via ORAL
  Filled 2019-01-31: qty 1

## 2019-01-31 NOTE — Progress Notes (Addendum)
Nutrition Education Note  RD consulted for Renal Education. Provided Renal Pyramid handout to patient/family. Reviewed food groups and provided written recommended serving sizes specifically determined for patient's current nutritional status.   Explained why diet restrictions are needed and provided lists of foods to limit/avoid that are high potassium, sodium, and phosphorus. Provided specific recommendations on safer alternatives of these foods. Strongly encouraged compliance of this diet.   Discussed importance of protein intake at each meal and snack. Provided examples of how to maximize protein intake throughout the day. Discussed need for fluid restriction with dialysis, importance of minimizing weight gain between HD treatments, and renal-friendly beverage options.  Encouraged pt to discuss specific diet questions/concerns with RD at HD outpatient facility. Teach back method used.  Expect fair compliance.  Body mass index is 35.54 kg/m. Pt meets criteria for obese based on current BMI.  Current diet order is renal/carb modified , patient is consuming approximately 75-100% of meals at this time. Labs and medications reviewed. No further nutrition interventions warranted at this time. RD contact information provided. If additional nutrition issues arise, please re-consult RD.  Mariana Single RD, LDN Clinical Nutrition Pager # [phone]

## 2019-01-31 NOTE — Discharge Instructions (Signed)

## 2019-01-31 NOTE — Discharge Summary (Signed)
Stephanie Woodward FUX:[id number] DOB: 1940/10/21 DOA: 01/23/2019  PCP: Alroy Dust, L.Marlou Sa, MD  Admit date: 01/23/2019 Discharge date: 01/31/2019  Admitted From: Home  Disposition: Home  Recommendations for Outpatient Follow-up:  1. Follow up with PCP in 1-2 weeks 2. Please obtain BMP/CBC in one week 3. New medications: Eliquis 5 mg BID 4. Please follow up on the following pending results:  Home Health:NO  Equipment/Devices:None  Discharge Condition: Stable CODE STATUS: Full  Diet recommendation: Renal  Brief/Interim Summary: History of present illness:  Stephanie Woodward is a 78 y.o. year old female with medical history significant for HTN, HLD,CHF, IDDM, solitary kidney, chronic kidney disease stage IV followed byDr.[place], chronic atrial fibrillation on Xarelto who presented on 01/23/2019 with several days of intermittent nonbilious, nonbloody nausea and vomiting with abdominal pain and confusion as well as poorly controlled glucose levels and was found to have AKI on CKD with creatinine of 4.82 (previous baseline creatinine 3.1, on 01/02/2019) and started on dialysis due to development of uremic type symptoms.  Renal was consulted, initially treated with IV fluids for presumed volume depletion related to nausea/vomiting/diarrhea and anorexia, but did not improve so patient underwent first HD session on 12/5.  Tolerated treatments in hospital without any complications.  Outpatient dialysis unit placement was arranged prior to discharge.  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  Dialysis started on 12/5 hospitalization and tolerated well.  Has left upper extremity aVF placed on 11/17/2018 but not ready for cannulation.  Dialysis catheter was placed here on 12/4 -Outpatient HD placement arranged prior to discharge  Hypertension, at  goal. -Managed with HD -Continue home metoprolol and hydralazine  Anemia of CKD,  hgb stable throughout hospital stay  Moderate- severe pulmonary hypertension, stable. -Followed by CHF clinic, resume home diuretic regimen -Volume management per HD  Combined systolic and diastolic CHF.  Held home diuretics initially on admission due to presenting symptoms.  Euvolemic on discharge  Type 2 diabetes, with hyperglycemia.  A1c 8.9.  Was continued on Lantus 30 units daily during hospital stay with sliding scale and 3 times daily 2 units NovoLog for mealtime coverage. -On discharge was continued on home Tresiba regimen -Has close follow-up with endocrinologist already arranged  Persistent atrial fibrillation Xarelto discontinued due to kidney function -Discharged on Eliquis  CAD, stable -Continue home Imdur  Chronic leukocytosis.  Ranges 12-18.  Remained afebrile throughout hospital stay.  No localizing signs symptoms of infection -Repeat CBC on hospital follow-up with PCP  Left foot pain, suspect related to musculoskeletal pain, improved with as needed pain control.  X-ray negative for fracture dislocation -Encourage supportive care.     Consultations:  Nephrology  Procedures/Studies: Feels well, anxious to go home.  Denies any nausea, vomiting, no abdominal pain, no chest pain Subjective:  Discharge Exam: Vitals:   01/31/19 0907 01/31/19 1631  BP: (!) 117/97 (!) 115/53  Pulse: 73 72  Resp: 18 18  Temp: 98.6 F (37 C) 97.9 F (36.6 C)  SpO2: 95% 97%   Vitals:   01/31/19 0500 01/31/19 0509 01/31/19 0907 01/31/19 1631  BP:  (!) 106/39 (!) 117/97 (!) 115/53  Pulse:  (!) 53 73 72  Resp:  '16 18 18  ' Temp:  98.2 F (36.8 C) 98.6 F (37 C) 97.9 F (36.6 C)  TempSrc:  Oral Oral Oral  SpO2:  97% 95% 97%  Weight:  91 kg     Height:        General: Sitting in bedside chair no apparent distress Eyes: EOMI, anicteric ENT: Oral Mucosa clear and moist Cardiovascular:, no  murmurs, no edema, Respiratory: Normal respiratory effort on room air, lungs clear to auscultation bilaterally Abdomen: soft, non-distended, non-tender, normal bowel sounds Skin: No Rash Neurologic: Grossly no focal neuro deficit.Mental status AAOx3, speech normal, Psychiatric:Appropriate affect, and mood  Discharge Diagnoses:  Principal Problem:   Acute renal failure superimposed on chronic kidney disease ([place]) Active Problems:   CHF (congestive heart failure) (HCC)   Essential hypertension   Diabetes mellitus with complication (HCC)   Leukocytosis   Hypokalemia   Atrial fibrillation, chronic (HCC)   Chronic anticoagulation    Discharge Instructions  Discharge Instructions    Diet - low sodium heart healthy   Complete by: As directed    Increase activity slowly   Complete by: As directed      Allergies as of 01/31/2019      Reactions   Eggs Or Egg-derived Products Nausea And Vomiting   Lisinopril Nausea And Vomiting   Penicillins Nausea And Vomiting   Has patient had a PCN reaction causing immediate rash, facial/tongue/throat swelling, SOB or lightheadedness with hypotension: No Has patient had a PCN reaction causing severe rash involving mucus membranes or skin necrosis: No Has patient had a PCN reaction that required hospitalization: No Has patient had a PCN reaction occurring within the last 10 years: No If all of the above answers are "NO", then may proceed with Cephalosporin use.      Medication List    STOP taking these medications   Rivaroxaban 15 MG Tabs tablet Commonly known as: XARELTO     TAKE these medications   albuterol 108 (90 Base) MCG/ACT inhaler Commonly known as: VENTOLIN HFA Inhale 2 puffs into the lungs every 4 (four) hours as needed for shortness of breath.   apixaban 5 MG Tabs tablet Commonly known as: ELIQUIS Take 1 tablet (5 mg total) by mouth 2 (two) times daily.   atorvastatin 80 MG tablet Commonly known as: LIPITOR Take 80 mg by  mouth every evening.   calcitRIOL 0.25 MCG capsule Commonly known as: ROCALTROL Take 0.25 mcg by mouth daily.   fluticasone 0.005 % ointment Commonly known as: CUTIVATE Apply 1 application topically 2 (two) times daily as needed (skin irritation/rash).   guaiFENesin 600 MG 12 hr tablet Commonly known as: MUCINEX Take 600 mg by mouth 2 (two) times daily as needed for cough.   hydrALAZINE 100 MG tablet Commonly known as: APRESOLINE Take 1 tablet (100 mg total) by mouth 3 (three) times daily.   insulin aspart 100 UNIT/ML injection Commonly known as: novoLOG Inject 10 Units into the skin daily with lunch.   isosorbide mononitrate 30 MG 24 hr tablet Commonly known as: IMDUR Take 1 tablet by mouth once daily   latanoprost 0.005 % ophthalmic solution Commonly known as: XALATAN Place 1 drop into both eyes at bedtime.   metolazone 2.5 MG tablet Commonly known as: ZAROXOLYN Take 2.5 mg by mouth daily.   metoprolol succinate 25 MG 24 hr tablet Commonly known as: TOPROL-XL Take 2 tablets (50 mg total) by mouth every morning AND 1 tablet (25 mg total) every evening.   Narcan 4 MG/0.1ML Liqd nasal spray kit Generic drug: naloxone Place 1 spray into the nose See admin instructions. Call EMS at first sign of opioid overdose. Place on spray in nose.  If no response,  then place one spray ing the opposite side of the nose.   ondansetron 4 MG tablet Commonly known as: ZOFRAN Take 4 mg by mouth 2 (two) times daily as needed for nausea.   pantoprazole 40 MG tablet Commonly known as: PROTONIX Take 40 mg by mouth daily.   torsemide 20 MG tablet Commonly known as: DEMADEX Take 4 tablets (80 mg total) by mouth 3 (three) times daily. Take an additional 21m (2 tabs) as needed What changed:   how much to take  when to take this  additional instructions   traMADol 50 MG tablet Commonly known as: ULTRAM Take 50 mg by mouth every 6 (six) hours as needed for severe pain (for pain.).    TTyler AasFlexTouch 100 UNIT/ML Sopn FlexTouch Pen Generic drug: insulin degludec Inject 25 Units into the skin daily.   triamcinolone cream 0.1 % Commonly known as: KENALOG Apply 1 application topically 2 (two) times daily as needed (skin rash/irritation.).       Allergies  Allergen Reactions  . Eggs Or Egg-Derived Products Nausea And Vomiting  . Lisinopril Nausea And Vomiting  . Penicillins Nausea And Vomiting    Has patient had a PCN reaction causing immediate rash, facial/tongue/throat swelling, SOB or lightheadedness with hypotension: No Has patient had a PCN reaction causing severe rash involving mucus membranes or skin necrosis: No Has patient had a PCN reaction that required hospitalization: No Has patient had a PCN reaction occurring within the last 10 years: No If all of the above answers are "NO", then may proceed with Cephalosporin use.         The results of significant diagnostics from this hospitalization (including imaging, microbiology, ancillary and laboratory) are listed below for reference.     Microbiology: Recent Results (from the past 240 hour(s))  Novel Coronavirus, NAA (Hosp order, Send-out to Ref Lab; TAT 18-24 hrs     Status: None   Collection Time: 01/23/19  5:38 PM   Specimen: Nasopharyngeal Swab; Respiratory  Result Value Ref Range Status   SARS-CoV-2, NAA NOT DETECTED NOT DETECTED Final    Comment: (NOTE) This nucleic acid amplification test was developed and its performance characteristics determined by [employer] Nucleic acid amplification tests include PCR and TMA. This test has not been FDA cleared or approved. This test has been authorized by FDA under an Emergency Use Authorization (EUA). This test is only authorized for the duration of time the declaration that circumstances exist justifying the authorization of the emergency use of in vitro diagnostic tests for detection of SARS-CoV-2 virus and/or diagnosis of COVID-19  infection under section 564(b)(1) of the Act, 21 U.S.C. 3546EVO-3(J (1), unless the authorization is terminated or revoked sooner. When diagnostic testing is negative, the possibility of a false negative result should be considered in the context of a patient's recent exposures and the presence of clinical signs and symptoms consistent with COVID-19. An individual without symptoms of COVID- 19 and who is not shedding SARS-CoV-2 vi rus would expect to have a negative (not detected) result in this assay. Performed At: [facility]BW[address] [id number] FarmerMD PHB:[phone]   [place] Final    Comment: Performed at [facility] [address] [place] SARS CORONAVIRUS 2 (TAT 6-24 HRS) Nasopharyngeal Nasopharyngeal Swab     Status: None   Collection Time: 01/24/19 12:19 PM   Specimen: Nasopharyngeal Swab  Result Value Ref Range Status   SARS  Coronavirus 2 NEGATIVE NEGATIVE Final    Comment: (NOTE) SARS-CoV-2 target nucleic acids are NOT DETECTED. The SARS-CoV-2 RNA is generally detectable in upper and lower respiratory specimens during the acute phase of infection. Negative results do not preclude SARS-CoV-2 infection, do not rule out co-infections with other pathogens, and should not be used as the sole basis for treatment or other patient management decisions. Negative results must be combined with clinical observations, patient history, and epidemiological information. The expected result is Negative. Fact Sheet for Patients: SugarRoll.be Fact Sheet for Healthcare Providers: https://www.woods-mathews.com/ This test is not yet approved or cleared by the [place] FDA and  has been authorized for detection and/or diagnosis of SARS-CoV-2 by FDA under an Emergency Use Authorization (EUA). This EUA will remain  in effect (meaning this test can be  used) for the duration of the COVID-19 declaration under Section 56 4(b)(1) of the Act, 21 U.S.C. section 360bbb-3(b)(1), unless the authorization is terminated or revoked sooner. Performed at [facility], [place] [address] [place], [place] [address]   Surgical PCR screen     Status: None   Collection Time: 01/25/19  5:03 PM   Specimen: Nasal Mucosa; Nasal Swab  Result Value Ref Range Status   MRSA, PCR NEGATIVE NEGATIVE Final   Staphylococcus aureus NEGATIVE NEGATIVE Final    Comment: (NOTE) The Xpert SA Assay (FDA approved for NASAL specimens in patients 63 years of age and older), is one component of a comprehensive surveillance program. It is not intended to diagnose infection nor to guide or monitor treatment. Performed at [facility], [place] [address], [place], [place] [address]      Labs: BNP (last 3 results) Recent Labs    07/18/18 1254 01/24/19 0816  BNP 168.5* 858.8*   Basic Metabolic Panel: Recent Labs  Lab 01/25/19 0323 01/25/19 1201 01/26/19 0216 01/27/19 0447 01/28/19 0550 01/29/19 0515 01/30/19 0627 01/31/19 0222  NA 138  --  135 140 138 138 136 134*  K 2.7*  --  3.3* 3.7 3.9 4.0 4.5 4.0  CL 90*  --  92* 96* 101 100 99 98  CO2 33*  --  '29 30 26 26 26 27  ' GLUCOSE 181*  --  304* 296* 248* 249* 270* 239*  BUN 127*  --  116* 107* 62* 70* 38* 21  CREATININE 4.55*  --  4.12* 3.77* 3.17* 3.65* 2.74* 2.58*  CALCIUM 8.0*  --  8.1* 8.3* 8.3* 8.8* 8.9 8.8*  MG  --  1.7 1.8  --   --   --   --   --   PHOS 4.3  --   --   --   --   --   --   --    Liver Function Tests: Recent Labs  Lab 01/25/19 0323  ALBUMIN 2.6*   No results for input(s): LIPASE, AMYLASE in the last 168 hours. No results for input(s): AMMONIA in the last 168 hours. CBC: Recent Labs  Lab 01/27/19 0447 01/28/19 0550 01/29/19 0515 01/30/19 0627 01/31/19 0222  WBC 12.2* 13.2* 14.0* 18.2* 18.0*  HGB 11.1* 11.0* 11.0* 11.7* 11.4*  HCT 35.9* 36.0 36.1 37.9 37.3  MCV 88.0  88.5 89.8 88.8 88.8  PLT 253 229 251 245 230   Cardiac Enzymes: Recent Labs  Lab 01/24/19 1729  CKTOTAL 100   BNP: Invalid input(s): POCBNP CBG: Recent Labs  Lab 01/30/19 2043 01/30/19 2228 01/31/19 0654 01/31/19 1113 01/31/19 1632  GLUCAP 191* 161* 290* 261*  208*   D-Dimer No results for input(s): DDIMER in the last 72 hours. Hgb A1c No results for input(s): HGBA1C in the last 72 hours. Lipid Profile No results for input(s): CHOL, HDL, LDLCALC, TRIG, CHOLHDL, LDLDIRECT in the last 72 hours. Thyroid function studies No results for input(s): TSH, T4TOTAL, T3FREE, THYROIDAB in the last 72 hours.  Invalid input(s): FREET3 Anemia work up No results for input(s): VITAMINB12, FOLATE, FERRITIN, TIBC, IRON, RETICCTPCT in the last 72 hours. Urinalysis    Component Value Date/Time   COLORURINE YELLOW 01/24/2019 1652   APPEARANCEUR HAZY (A) 01/24/2019 1652   LABSPEC 1.011 01/24/2019 1652   PHURINE 6.0 01/24/2019 1652   GLUCOSEU NEGATIVE 01/24/2019 1652   HGBUR NEGATIVE 01/24/2019 1652   BILIRUBINUR NEGATIVE 01/24/2019 1652   KETONESUR NEGATIVE 01/24/2019 1652   PROTEINUR 100 (A) 01/24/2019 1652   UROBILINOGEN 0.2 01/23/2019 1718   NITRITE NEGATIVE 01/24/2019 1652   LEUKOCYTESUR NEGATIVE 01/24/2019 1652   Sepsis Labs Invalid input(s): PROCALCITONIN,  WBC,  LACTICIDVEN Microbiology Recent Results (from the past 240 hour(s))  Novel Coronavirus, NAA (Hosp order, Send-out to Ref Lab; TAT 18-24 hrs     Status: None   Collection Time: 01/23/19  5:38 PM   Specimen: Nasopharyngeal Swab; Respiratory  Result Value Ref Range Status   SARS-CoV-2, NAA NOT DETECTED NOT DETECTED Final    Comment: (NOTE) This nucleic acid amplification test was developed and its performance characteristics determined by [employer]. Nucleic acid amplification tests include PCR and TMA. This test has not been FDA cleared or approved. This test has been authorized by FDA under an Emergency  Use Authorization (EUA). This test is only authorized for the duration of time the declaration that circumstances exist justifying the authorization of the emergency use of in vitro diagnostic tests for detection of SARS-CoV-2 virus and/or diagnosis of COVID-19 infection under section 564(b)(1) of the Act, 21 U.S.C. 194RDE-0(C) (1), unless the authorization is terminated or revoked sooner. When diagnostic testing is negative, the possibility of a false negative result should be considered in the context of a patient's recent exposures and the presence of clinical signs and symptoms consistent with COVID-19. An individual without symptoms of COVID- 19 and who is not shedding SARS-CoV-2 vi rus would expect to have a negative (not detected) result in this assay. Performed At: [facility] [address] [place], [place] [id number] Rush Farmer MD JS:[phone]    [place]  Final    Comment: Performed at [facility], [place] [address] [place], [place] [address]  SARS CORONAVIRUS 2 (TAT 6-24 HRS) Nasopharyngeal Nasopharyngeal Swab     Status: None   Collection Time: 01/24/19 12:19 PM   Specimen: Nasopharyngeal Swab  Result Value Ref Range Status   SARS Coronavirus 2 NEGATIVE NEGATIVE Final    Comment: (NOTE) SARS-CoV-2 target nucleic acids are NOT DETECTED. The SARS-CoV-2 RNA is generally detectable in upper and lower respiratory specimens during the acute phase of infection. Negative results do not preclude SARS-CoV-2 infection, do not rule out co-infections with other pathogens, and should not be used as the sole basis for treatment or other patient management decisions. Negative results must be combined with clinical observations, patient history, and epidemiological information. The expected result is Negative. Fact Sheet for Patients: SugarRoll.be Fact Sheet for Healthcare  Providers: https://www.woods-mathews.com/ This test is not yet approved or cleared by the [place] FDA and  has been authorized for detection and/or diagnosis of SARS-CoV-2 by FDA under an Emergency Use Authorization (  EUA). This EUA will remain  in effect (meaning this test can be used) for the duration of the COVID-19 declaration under Section 56 4(b)(1) of the Act, 21 U.S.C. section 360bbb-3(b)(1), unless the authorization is terminated or revoked sooner. Performed at [facility], [place] [address] [place], [place] [address]   Surgical PCR screen     Status: None   Collection Time: 01/25/19  5:03 PM   Specimen: Nasal Mucosa; Nasal Swab  Result Value Ref Range Status   MRSA, PCR NEGATIVE NEGATIVE Final   Staphylococcus aureus NEGATIVE NEGATIVE Final    Comment: (NOTE) The Xpert SA Assay (FDA approved for NASAL specimens in patients 67 years of age and older), is one component of a comprehensive surveillance program. It is not intended to diagnose infection nor to guide or monitor treatment. Performed at [facility], [place] [address], [place], [place] [address]      Time coordinating discharge: Over 30 minutes  SIGNED:   Desiree Hane, MD  [facility] 01/31/2019, 5:15 PM Pager   If 7PM-7AM, please contact night-coverage [email or web address] Password [email or web address]

## 2019-01-31 NOTE — Care Management (Addendum)
Entered benefit check for apixaban 5mg  PO BID    Patient can use 30 free card for first 30 day supply.   Magdalen Spatz RN

## 2019-01-31 NOTE — Care Management (Signed)
Per Carroll Kinds W/[employer]  PH# [phone]. Co-pay amount for Eliquis 5 mg. bid for a 30 day supply  Zero co-pay.   PA not required Deductible not met Tier 3 medication Pharmacy [facility] or any pharmacy .  Ref# [id number]

## 2019-01-31 NOTE — Progress Notes (Signed)
Patient has been accepted at [facility] on a TTS schedule with a seat time of 12:10pm. She is now cleared for discharge and needs to arrive to her first appointment tomorrow at 11:15am to complete intake paperwork. Patient and daughter/[name] aware. [name] will provide transportation.  Renal Navigator completed and submitted SCAT application, which has now been approved. Patient understands that she is pre-certified and will receive something in the mail regarding full certification. She can start using [employer] transportation for her appointment on Saturday and has the phone number to call to schedule. OP HD clinic aware that patient will start tomorrow. Nephrologist and Primary aware of plan.   Alphonzo Cruise, [place] Renal Navigator [phone]

## 2019-01-31 NOTE — Progress Notes (Signed)
Inpatient Diabetes Program Recommendations  AACE/ADA: New Consensus Statement on Inpatient Glycemic Control (2015)  Target Ranges:  Prepandial:   less than 140 mg/dL      Peak postprandial:   less than 180 mg/dL (1-2 hours)      Critically ill patients:  140 - 180 mg/dL   Lab Results  Component Value Date   GLUCAP 290 (H) 01/31/2019   HGBA1C 8.9 (H) 01/24/2019    Review of Glycemic Control Results for Stephanie Woodward, Stephanie Woodward (MRN [id number]) as of 01/31/2019 10:19  Ref. Range 01/30/2019 06:51 01/30/2019 11:41 01/30/2019 18:24 01/30/2019 19:24 01/30/2019 20:43 01/30/2019 22:28 01/31/2019 06:54  Glucose-Capillary Latest Ref Range: 70 - 99 mg/dL 291 (H)  Novolog 5 units (given 3 hours after glucose check)  Lantus 30 units given 266 (H)  Novolog 5 units given 68 (L) 147 (H) 191 (H) 161 (H) 290 (H)  Novolog 5 units  Lantus 30 units   Diabetes history: DM 2 Outpatient Diabetes medications: Tresiba 25 units Daily, Novolog 10 units daily at lunch Current orders for Inpatient glycemic control:  Lantus 30 units Daily Novolog 0-6 units Very Sensitive scale Novolog 2 units tid meal coverage  Inpatient Diabetes Program Recommendations:    Mild hypoglycemia due to Novolog insulin stacking. Insulin timing possible contributor as Novolog given 3 hours after glucose check after breakfast. Fasting glucose 290's past 2 mornings.   Overall reducing fasting glucose to prevent a large dose of Novolog will prevent hypoglycemia.  Consider Lantus 36 units Daily. Consider giving additional 6 units today.  Thanks,  Tama Headings RN, MSN, BC-ADM Inpatient Diabetes Coordinator Team Pager [phone] (8a-5p)

## 2019-01-31 NOTE — Progress Notes (Signed)
DISCHARGE NOTE HOME Stephanie Woodward to be discharged Home per MD order. Discussed prescriptions and follow up appointments with the patient. Prescriptions given to patient; medication list explained in detail. Patient verbalized understanding.  Skin clean, dry and intact without evidence of skin break down, no evidence of skin tears noted. IV catheter discontinued intact. Site without signs and symptoms of complications. Dressing and pressure applied. Pt denies pain at the site currently. No complaints noted.  Patient free of lines, drains, and wounds.   An After Visit Summary (AVS) was printed and given to the patient. Patient escorted via wheelchair, and discharged home via private auto.  Aneta Mins BSN, RN3

## 2019-01-31 NOTE — Progress Notes (Signed)
Patient ID: Brittny Spangle, female   DOB: 1940-09-11, 78 y.o.   MRN: [id number] [facility] Progress Note   Assessment/ Plan:   1.  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  With immature left BCF and now status post right IJ TDC.  Underwent first hemodialysis on 12/5, possibly has an OP HD slot at [facility] beginning tomorrow and may be able to DC home today if t/port set up. 2.  Hypertension: Blood pressures currently under good control, will continue to follow with hemodialysis. 3.  Moderate to severe pulmonary hypertension: With combined systolic/diastolic CHF and currently appears to be compensated-we will continue to follow with UF on HD 4.  Paroxysmal atrial fibrillation: Appears to be rate controlled at this time.  On heparin drip and may potentially be able to begin anticoagulation with Eliquis at lower renally adjusted dose.  5.  Secondary hyperparathyroidism: Calcium and phosphorus level within acceptable range, will follow up on recent PTH level. 6.  Anemia of chronic kidney disease: Hemoglobin/hematocrit currently at goal without overt loss postoperatively.  Subjective:   Feels well and without complaints-- no problems at HD yesterday.   Objective:   BP (!) 117/97 (BP Location: Right Arm)   Pulse 73   Temp 98.6 F (37 C) (Oral)   Resp 18   Ht 5\' 3"  (1.6 m)   Wt 91 kg   SpO2 95%   BMI 35.54 kg/m   Intake/Output Summary (Last 24 hours) at 01/31/2019 1124 Last data filed at 01/31/2019 0934 Gross per 24 hour  Intake 675.2 ml  Output 1132 ml  Net -456.8 ml   Weight change: -0.9 kg  Physical Exam: Gen: Comfortably sitting in bed doing puzzles CVS: Pulse regular rhythm, normal rate, S1 and S2 normal Resp: Clear to auscultation without rales or rhonchi.  Right IJ TDC in situ Abd: Soft, flat, nontender Ext: Nonpitting lower extremity edema, left  upper arm BCF with palpable thrill  Imaging: No results found.  Labs: BMET Recent Labs  Lab 01/25/19 0323 01/26/19 0216 01/27/19 0447 01/28/19 0550 01/29/19 0515 01/30/19 0627 01/31/19 0222  NA 138 135 140 138 138 136 134*  K 2.7* 3.3* 3.7 3.9 4.0 4.5 4.0  CL 90* 92* 96* 101 100 99 98  CO2 33* 29 30 26 26 26 27   GLUCOSE 181* 304* 296* 248* 249* 270* 239*  BUN 127* 116* 107* 62* 70* 38* 21  CREATININE 4.55* 4.12* 3.77* 3.17* 3.65* 2.74* 2.58*  CALCIUM 8.0* 8.1* 8.3* 8.3* 8.8* 8.9 8.8*  PHOS 4.3  --   --   --   --   --   --    CBC Recent Labs  Lab 01/28/19 0550 01/29/19 0515 01/30/19 0627 01/31/19 0222  WBC 13.2* 14.0* 18.2* 18.0*  HGB 11.0* 11.0* 11.7* 11.4*  HCT 36.0 36.1 37.9 37.3  MCV 88.5 89.8 88.8 88.8  PLT 229 251 245 230    Medications:    . apixaban  5 mg Oral BID  . atorvastatin  80 mg Oral QPM  . calcitRIOL  0.25 mcg Oral Daily  . Chlorhexidine Gluconate Cloth  6 each Topical Daily  . hydrALAZINE  100 mg Oral TID  . insulin aspart  0-6 Units Subcutaneous TID WC  . insulin aspart  2 Units Subcutaneous TID WC  . insulin glargine  30 Units Subcutaneous Daily  . isosorbide mononitrate  30 mg Oral Daily  .  latanoprost  1 drop Both Eyes QHS  . metoprolol succinate  25 mg Oral QHS  . metoprolol succinate  50 mg Oral Daily  . sodium chloride flush  3 mL Intravenous Once  . sodium chloride flush  3 mL Intravenous Q12H   Elmarie Shiley, MD 01/31/2019, 11:24 AM

## 2019-01-31 NOTE — Progress Notes (Addendum)
ANTICOAGULATION CONSULT NOTE   Pharmacy Consult for Heparin Indication: atrial fibrillation  Patient Measurements: Height: 5\' 3"  (160 cm) Weight: 200 lb 9.9 oz (91 kg) IBW/kg (Calculated) : 52.4 Heparin Dosing Weight: 74.4kg  Vital Signs: Temp: 98.2 F (36.8 C) (12/09 0509) Temp Source: Oral (12/09 0509) BP: 106/39 (12/09 0509) Pulse Rate: 53 (12/09 0509)  Labs: Recent Labs    01/29/19 0515 01/29/19 1633 01/30/19 0627 01/31/19 0222  HGB 11.0*  --  11.7* 11.4*  HCT 36.1  --  37.9 37.3  PLT 251  --  245 230  HEPARINUNFRC 0.26* 0.42 0.29* 0.38  CREATININE 3.65*  --  2.74* 2.58*    Estimated Creatinine Clearance: 19.9 mL/min (A) (by C-G formula based on SCr of 2.58 mg/dL (H)).  Assessment: 78 y.o. female with h/o Afib, PTA Xarelto on hold, for heparin in the interim.  Pt is s/p VVS placement of TDC for hemodialysis on 12/4.  Heparin level this morning is therapeutic at 0.38 (goal of 0.3-0.7). No bleeding noted.   Goal of Therapy:  Heparin level 0.3-0.7 units/ml aPTT 66-102 seconds Monitor platelets by anticoagulation protocol: Yes   Plan:  - Continue Heparin at 1200 units/hr (12 ml/hr) - f/u plan to change to oral agent, Apixaban 5mg  BID if no renal recovery.  - Will continue to monitor for any signs/symptoms of bleeding  Licia Harl A. Levada Dy, PharmD, BCPS, FNKF Clinical Pharmacist [place] Please utilize Amion for appropriate phone number to reach the unit pharmacist ([place])   Addendum:   D/W Primary MD, Will start apixaban 5mg  PO BID now and d/c heparin drip at the time of first apixaban dose. Will likely need CM consult for apixaban coverage determination.   Nautika Cressey A. Levada Dy, PharmD, BCPS, FNKF Clinical Pharmacist [place] Please utilize Amion for appropriate phone number to reach the unit pharmacist ([place])

## 2019-01-31 NOTE — Patient Outreach (Addendum)
  [facility]) Care Management Chronic Special Needs Program   01/31/2019  Name: Stephanie Woodward, DOB: [birth year]  MRN: [id number]  The client was discussed in today's interdisciplinary care team meeting.  The following issues were discussed:  Client's needs, Changes in health status, Care Plan, Coordination of care, Care transitions and Issues/barriers to care  Client remains inpatient. Client is actively involved with [facility].   Participants present:  Thea Silversmith, MSN, RN, CCM   Melissa Sandlin RN,BSN,CCM, CDE  Quinn Plowman RN, BSN, CCM Kelli Churn, RN, CCM, CDE  Marco Collie, MD Maryella Shivers, MD  Ignacia Palma, RN, BSN, MS, CCM [place] [facility] CBCS/CMAA [facility] RN Teresa Pelton RN, BSN   Plan:  Continue care coordination and collaboration with [facility]. Team. RNCM will continue to follow as clients CSNP care management care coordinator.   Quinn Plowman RN,BSN,CCM [place] [facility] [phone]

## 2019-02-01 ENCOUNTER — Other Ambulatory Visit: Payer: Self-pay

## 2019-02-01 DIAGNOSIS — D631 Anemia in chronic kidney disease: Secondary | ICD-10-CM | POA: Diagnosis not present

## 2019-02-01 DIAGNOSIS — N186 End stage renal disease: Secondary | ICD-10-CM | POA: Diagnosis not present

## 2019-02-01 DIAGNOSIS — Z23 Encounter for immunization: Secondary | ICD-10-CM | POA: Diagnosis not present

## 2019-02-01 DIAGNOSIS — D689 Coagulation defect, unspecified: Secondary | ICD-10-CM | POA: Diagnosis not present

## 2019-02-01 DIAGNOSIS — D509 Iron deficiency anemia, unspecified: Secondary | ICD-10-CM | POA: Diagnosis not present

## 2019-02-01 DIAGNOSIS — T8249XD Other complication of vascular dialysis catheter, subsequent encounter: Secondary | ICD-10-CM | POA: Diagnosis not present

## 2019-02-01 DIAGNOSIS — Z992 Dependence on renal dialysis: Secondary | ICD-10-CM | POA: Diagnosis not present

## 2019-02-01 DIAGNOSIS — R392 Extrarenal uremia: Secondary | ICD-10-CM | POA: Diagnosis not present

## 2019-02-01 DIAGNOSIS — E1122 Type 2 diabetes mellitus with diabetic chronic kidney disease: Secondary | ICD-10-CM | POA: Diagnosis not present

## 2019-02-01 DIAGNOSIS — Z111 Encounter for screening for respiratory tuberculosis: Secondary | ICD-10-CM | POA: Diagnosis not present

## 2019-02-01 DIAGNOSIS — N2581 Secondary hyperparathyroidism of renal origin: Secondary | ICD-10-CM | POA: Diagnosis not present

## 2019-02-01 NOTE — Patient Outreach (Addendum)
  [facility]) Care Management Chronic Special Needs Program  02/01/2019  Name: Ashlynne Shetterly DOB: 1940/12/09  MRN: [id number]  Ms. Coley Littles is enrolled in a chronic special needs plan for Heart Failure. Reviewed and updated care plan. Client discharged on 01/31/19 to home.  Transition of care call to be completed by [facility]  Per [facility], Dannielle Huh clients SCAT application has been approved.      Goals Addressed            This Visit's Progress   . General - Client will not be readmitted within 30 days (C-SNP) Discharge date 01/31/19       Please follow discharge instructions and call provider if you have any questions. Please attend all follow up appointments as scheduled. Please take your medications as prescribed. Please call [facility] as needed.  [phone]         Plan: RNCM will review [facility]'s plan of care and collaborate with [facility] as indicated.    Quinn Plowman RN,BSN,CCM Chronic Care Management Coordinator [place] [facility] [phone]     .

## 2019-02-02 ENCOUNTER — Encounter: Payer: Self-pay | Admitting: Internal Medicine

## 2019-02-02 ENCOUNTER — Ambulatory Visit (INDEPENDENT_AMBULATORY_CARE_PROVIDER_SITE_OTHER): Payer: HMO | Admitting: Internal Medicine

## 2019-02-02 VITALS — BP 110/58 | HR 76 | Temp 98.5°F | Ht 63.0 in | Wt 208.2 lb

## 2019-02-02 DIAGNOSIS — N184 Chronic kidney disease, stage 4 (severe): Secondary | ICD-10-CM

## 2019-02-02 DIAGNOSIS — E1122 Type 2 diabetes mellitus with diabetic chronic kidney disease: Secondary | ICD-10-CM

## 2019-02-02 DIAGNOSIS — E11649 Type 2 diabetes mellitus with hypoglycemia without coma: Secondary | ICD-10-CM | POA: Insufficient documentation

## 2019-02-02 DIAGNOSIS — E1165 Type 2 diabetes mellitus with hyperglycemia: Secondary | ICD-10-CM

## 2019-02-02 DIAGNOSIS — Z794 Long term (current) use of insulin: Secondary | ICD-10-CM | POA: Diagnosis not present

## 2019-02-02 NOTE — Patient Instructions (Addendum)
-   Decrease Tresiba to 20 units daily  - Novolog 8 units with each meal ( Breakfast, Lunch and Supper)   - Check sugar before each meal and at bedtime    HOW TO TREAT LOW BLOOD SUGARS (Blood sugar LESS THAN 70 MG/DL)  Please follow the RULE OF 15 for the treatment of hypoglycemia treatment (when your (blood sugars are less than 70 mg/dL)    STEP 1: Take 15 grams of carbohydrates when your blood sugar is low, which includes:   3-4 GLUCOSE TABS  OR  3-4 OZ OF JUICE OR REGULAR SODA OR  ONE TUBE OF GLUCOSE GEL     STEP 2: RECHECK blood sugar in 15 MINUTES STEP 3: If your blood sugar is still low at the 15 minute recheck --> then, go back to STEP 1 and treat AGAIN with another 15 grams of carbohydrates.

## 2019-02-02 NOTE — Progress Notes (Signed)
Name: Stephanie Woodward  MRN/ DOB: [id number], February 16, 1941   Age/ Sex: 78 y.o., female    PCP: Alroy Dust, L.Marlou Sa, MD   Reason for Endocrinology Evaluation: Type 2 Diabetes Mellitus     Date of Initial Endocrinology Visit: 02/02/2019     PATIENT IDENTIFIER: [name] is a 78 y.o. female with a past medical history of T2DM, HTN, ESRD on HD and CHF. The patient presented for initial endocrinology clinic visit on 02/02/2019 for consultative assistance with her diabetes management.    HPI: [name] with  daughter Stephanie Woodward    Diagnosed with DM 2005 Prior Medications tried/Intolerance: Metformin, does not recall  Currently checking blood sugars multiple times a day through CGM Hypoglycemia episodes : yes               Symptoms: no symptoms                Frequency: 2/ week  Hemoglobin A1c has ranged from 8.8% in 2018, peaking at 8.9% in 2020. Patient required assistance for hypoglycemia: no Patient has required hospitalization within the last 1 year from hyper or hypoglycemia: no   In terms of diet, the patient eats 3 meals a day, snacks between each meal. Avoids sugar-sweetened beverages    HOME DIABETES REGIMEN: Tresiba 25 units  Novolog 10 units once daily after lunch    Statin: Yes ACE-I/ARB: No    CONTINUOUS GLUCOSE MONITORING RECORD INTERPRETATION    Dates of Recording: 11/25-12/09/2018  Sensor description:Fresstyle Libre    Results statistics:   CGM use % of time 36  Average and SD 249/ 36.5  Time in range    19    %  % Time Above 180 31  % Time above 250 48  % Time Below target 1    Glycemic patterns summary: Hyperglycemia noted during the day with low BG's in the mornings with the lowest BG 48 mg/dL   Hyperglycemic episodes:  Postprandial   Hypoglycemic episodes occurred early morning  Overnight periods: Decrease with hypoglycemia     DIABETIC COMPLICATIONS: Microvascular complications:   ESRD on HD   Denies: neuropathy ,  retinopathy   Last eye exam: Completed 2019  Macrovascular complications:   CHF  Denies: CAD, PVD, CVA   PAST HISTORY: Past Medical History:  Past Medical History:  Diagnosis Date  . Arthritis   . Asthma   . Atrial fibrillation ([place])   . CHF (congestive heart failure) ([place])   . CKD (chronic kidney disease), stage III   . COPD (chronic obstructive pulmonary disease) ([place])   . Diabetes mellitus    INSULIN DEPENDENT  . Gout   . Heart murmur    no issues per pt  . History of kidney stones   . Hyperlipemia   . Hypertension   . Psoriasis   . Renal disorder    congenital  . Single kidney   . Sleep apnea    doesn't use the Cpap   Past Surgical History:  Past Surgical History:  Procedure Laterality Date  . ABDOMINAL HYSTERECTOMY    . AV FISTULA PLACEMENT Left 11/17/2018   Procedure: BRACHIO-CEPHALIC ARTERIOVENOUS (AV) FISTULA CREATION IN LEFT ARM;  Surgeon: Marty Heck, MD;  Location: [place];  Service: Vascular;  Laterality: Left;  . CHOLECYSTECTOMY    . INSERTION OF DIALYSIS CATHETER Right 01/26/2019   Procedure: INSERTION OF DIALYSIS CATHETER, right internal jugular;  Surgeon: Angelia Mould, MD;  Location: [place];  Service: Vascular;  Laterality:  Right;  Marland Kitchen LEFT AND RIGHT HEART CATHETERIZATION WITH CORONARY ANGIOGRAM N/A 11/29/2013   Procedure: LEFT AND RIGHT HEART CATHETERIZATION WITH CORONARY ANGIOGRAM;  Surgeon: Jacolyn Reedy, MD;  Location: [facility] CATH LAB;  Service: Cardiovascular;  Laterality: N/A;  . RIGHT HEART CATH N/A 11/23/2017   Procedure: RIGHT HEART CATH;  Surgeon: Larey Dresser, MD;  Location: [place] CV LAB;  Service: Cardiovascular;  Laterality: N/A;      Social History:  reports that she quit smoking about 9 years ago. Her smoking use included cigarettes. She has a 20.00 pack-year smoking history. She has never used smokeless tobacco. She reports current alcohol use. She reports previous drug use. Family History:  Family History   Problem Relation Age of Onset  . Lupus Daughter   . Cancer Mother 30  . CVA Father 5  . Cancer Brother 50     HOME MEDICATIONS: Allergies as of 02/02/2019      Reactions   Eggs Or Egg-derived Products Nausea And Vomiting   Lisinopril Nausea And Vomiting   Penicillins Nausea And Vomiting   Has patient had a PCN reaction causing immediate rash, facial/tongue/throat swelling, SOB or lightheadedness with hypotension: No Has patient had a PCN reaction causing severe rash involving mucus membranes or skin necrosis: No Has patient had a PCN reaction that required hospitalization: No Has patient had a PCN reaction occurring within the last 10 years: No If all of the above answers are "NO", then may proceed with Cephalosporin use.      Medication List       Accurate as of February 02, 2019  3:42 PM. If you have any questions, ask your nurse or doctor.        albuterol 108 (90 Base) MCG/ACT inhaler Commonly known as: VENTOLIN HFA Inhale 2 puffs into the lungs every 4 (four) hours as needed for shortness of breath.   apixaban 5 MG Tabs tablet Commonly known as: ELIQUIS Take 1 tablet (5 mg total) by mouth 2 (two) times daily.   atorvastatin 80 MG tablet Commonly known as: LIPITOR Take 80 mg by mouth every evening.   calcitRIOL 0.25 MCG capsule Commonly known as: ROCALTROL Take 0.25 mcg by mouth daily.   fluticasone 0.005 % ointment Commonly known as: CUTIVATE Apply 1 application topically 2 (two) times daily as needed (skin irritation/rash).   FreeStyle Libre 14 Day Sensor Misc by Does not apply route.   FreeStyle [employer] by Does not apply route.   guaiFENesin 600 MG 12 hr tablet Commonly known as: MUCINEX Take 600 mg by mouth 2 (two) times daily as needed for cough.   hydrALAZINE 100 MG tablet Commonly known as: APRESOLINE Take 1 tablet (100 mg total) by mouth 3 (three) times daily.   insulin aspart 100 UNIT/ML injection Commonly known as: novoLOG  Inject 10 Units into the skin daily with lunch.   isosorbide mononitrate 30 MG 24 hr tablet Commonly known as: IMDUR Take 1 tablet by mouth once daily   latanoprost 0.005 % ophthalmic solution Commonly known as: XALATAN Place 1 drop into both eyes at bedtime.   metolazone 2.5 MG tablet Commonly known as: ZAROXOLYN Take 2.5 mg by mouth daily.   metoprolol succinate 25 MG 24 hr tablet Commonly known as: TOPROL-XL Take 2 tablets (50 mg total) by mouth every morning AND 1 tablet (25 mg total) every evening.   Narcan 4 MG/0.1ML Liqd nasal spray kit Generic drug: naloxone Place 1 spray into the nose See admin  instructions. Call EMS at first sign of opioid overdose. Place on spray in nose.  If no response, then place one spray ing the opposite side of the nose.   ondansetron 4 MG tablet Commonly known as: ZOFRAN Take 4 mg by mouth 2 (two) times daily as needed for nausea.   pantoprazole 40 MG tablet Commonly known as: PROTONIX Take 40 mg by mouth daily.   torsemide 20 MG tablet Commonly known as: DEMADEX Take 4 tablets (80 mg total) by mouth 3 (three) times daily. Take an additional 77m (2 tabs) as needed What changed:   how much to take  when to take this  additional instructions   traMADol 50 MG tablet Commonly known as: ULTRAM Take 50 mg by mouth every 6 (six) hours as needed for severe pain (for pain.).   TTyler AasFlexTouch 100 UNIT/ML Sopn FlexTouch Pen Generic drug: insulin degludec Inject 25 Units into the skin daily.   triamcinolone cream 0.1 % Commonly known as: KENALOG Apply 1 application topically 2 (two) times daily as needed (skin rash/irritation.).        ALLERGIES: Allergies  Allergen Reactions  . Eggs Or Egg-Derived Products Nausea And Vomiting  . Lisinopril Nausea And Vomiting  . Penicillins Nausea And Vomiting    Has patient had a PCN reaction causing immediate rash, facial/tongue/throat swelling, SOB or lightheadedness with hypotension: No  Has patient had a PCN reaction causing severe rash involving mucus membranes or skin necrosis: No Has patient had a PCN reaction that required hospitalization: No Has patient had a PCN reaction occurring within the last 10 years: No If all of the above answers are "NO", then may proceed with Cephalosporin use.      REVIEW OF SYSTEMS: A comprehensive ROS was conducted with the patient and is negative except as per HPI and below:  Review of Systems  Constitutional: Negative for chills and fever.  HENT: Negative for congestion and sore throat.   Respiratory: Positive for shortness of breath. Negative for cough.   Cardiovascular: Negative for chest pain and palpitations.  Gastrointestinal: Positive for diarrhea and nausea.  Genitourinary: Negative for frequency.  Neurological: Negative for tingling and tremors.  Endo/Heme/Allergies: Negative for polydipsia.  Psychiatric/Behavioral: Positive for depression. The patient is not nervous/anxious.       OBJECTIVE:   VITAL SIGNS: BP (!) 110/58 (BP Location: Right Arm, Patient Position: Sitting, Cuff Size: Normal)   Pulse 76   Temp 98.5 F (36.9 C)   Ht '5\' 3"'  (1.6 m)   Wt 208 lb 3.2 oz (94.4 kg)   SpO2 95%   BMI 36.88 kg/m    PHYSICAL EXAM:  General: Pt appears well and is in NAD  HEENT: Head: Unremarkable.  Eyes: External eye exam normal without stare, lid lag or exophthalmos.  EOM intact.   Neck: General: Supple without adenopathy or carotid bruits. Thyroid: Thyroid size normal.  No goiter or nodules appreciated. No thyroid bruit.  Lungs: Clear with good BS bilat with no rales, rhonchi, or wheezes  Heart: RRR with normal S1 and S2 and no gallops; no murmurs; no rub  Abdomen: Normoactive bowel sounds, soft, nontender, without masses or organomegaly palpable  Extremities:  Lower extremities - Trace  pretibial edema.   Skin: Normal texture and temperature to palpation.  Neuro: MS is good with appropriate affect, pt is alert and Ox3     DM foot exam: Deferred   DATA REVIEWED:  Lab Results  Component Value Date   HGBA1C 8.9 (H) 01/24/2019  HGBA1C 8.8 (H) 03/10/2016   Lab Results  Component Value Date   LDLCALC 97 03/27/2007   CREATININE 2.58 (H) 01/31/2019    Lab Results  Component Value Date   CHOL 205 (H) 03/27/2007   HDL 79 03/27/2007   LDLCALC 97 03/27/2007   TRIG 147 03/27/2007   CHOLHDL 2.6 Ratio 03/27/2007        ASSESSMENT / PLAN / RECOMMENDATIONS:   1) Type 2 Diabetes Mellitus, Poorly controlled, With ESRD  And hypoglycemia unawareness  - Most recent A1c of 8.9 %. Goal A1c < 7.5 %.    Plan: GENERAL:  Pt with multiple co morbidities and its appropriate to have an A1c goal of 7.5%   Unfortunately pt with hypoglycemia unawareness , this is most likely due to recurrent hypoglycemia  Our primary goal at this time is to eliminate hypoglycemia, will reduce basal insulin as below Discussed pharmacokinetics of basal/bolus insulin and the importance of taking prandial insulin with meals.      MEDICATIONS: Decrease Tresiba to 20 units daily  Novolog 8 units with each meal ( Breakfast, Lunch and Supper)   EDUCATION / INSTRUCTIONS:  BG monitoring instructions: Patient is instructed to check her blood sugars 4 times a day, before meals and bedtime.  Call [facility] if: BG persistently < 70 or > 300. . I reviewed the Rule of 15 for the treatment of hypoglycemia in detail with the patient. Literature supplied.   2) Diabetic complications:   Eye: Does not have known diabetic retinopathy.   Neuro/ Feet: Does not have known diabetic peripheral neuropathy.  Renal: Patient does have known ESRD on HD.     F/U in 2 months    Signed electronically by: Mack Guise, MD  [facility] Endocrinology  [address] [place], [place] [address] Phone: [phone] FAX: [phone]   CC: Aurea Graff.Marlou Sa, [place] [address]  [place] [address] Phone: [phone]  Fax: [phone]    Return to Endocrinology clinic as below: Future Appointments  Date Time Provider [place]  02/06/2019 11:00 AM Larey Dresser, MD [facility] None  02/12/2019 10:00 AM Dannielle Karvonen, RN [facility] None  04/04/2019 10:10 AM Gwendolyn Mclees, Melanie Crazier, MD [facility] None

## 2019-02-05 ENCOUNTER — Other Ambulatory Visit (HOSPITAL_COMMUNITY): Payer: Self-pay | Admitting: Cardiology

## 2019-02-06 ENCOUNTER — Ambulatory Visit (HOSPITAL_COMMUNITY)
Admission: RE | Admit: 2019-02-06 | Discharge: 2019-02-06 | Disposition: A | Discharge status: Home / Self Care | Payer: HMO | Source: Ambulatory Visit | Attending: Cardiology | Admitting: Cardiology

## 2019-02-06 ENCOUNTER — Other Ambulatory Visit: Payer: Self-pay

## 2019-02-06 ENCOUNTER — Encounter (HOSPITAL_COMMUNITY): Payer: Self-pay | Admitting: Cardiology

## 2019-02-06 VITALS — BP 114/35 | HR 51 | Wt 203.8 lb

## 2019-02-06 DIAGNOSIS — Z7901 Long term (current) use of anticoagulants: Secondary | ICD-10-CM | POA: Diagnosis not present

## 2019-02-06 DIAGNOSIS — Z79899 Other long term (current) drug therapy: Secondary | ICD-10-CM | POA: Insufficient documentation

## 2019-02-06 DIAGNOSIS — E785 Hyperlipidemia, unspecified: Secondary | ICD-10-CM | POA: Insufficient documentation

## 2019-02-06 DIAGNOSIS — I272 Pulmonary hypertension, unspecified: Secondary | ICD-10-CM | POA: Diagnosis not present

## 2019-02-06 DIAGNOSIS — E1122 Type 2 diabetes mellitus with diabetic chronic kidney disease: Secondary | ICD-10-CM | POA: Insufficient documentation

## 2019-02-06 DIAGNOSIS — Z87891 Personal history of nicotine dependence: Secondary | ICD-10-CM | POA: Insufficient documentation

## 2019-02-06 DIAGNOSIS — Z794 Long term (current) use of insulin: Secondary | ICD-10-CM | POA: Diagnosis not present

## 2019-02-06 DIAGNOSIS — I5032 Chronic diastolic (congestive) heart failure: Secondary | ICD-10-CM | POA: Insufficient documentation

## 2019-02-06 DIAGNOSIS — Z992 Dependence on renal dialysis: Secondary | ICD-10-CM | POA: Insufficient documentation

## 2019-02-06 DIAGNOSIS — I5022 Chronic systolic (congestive) heart failure: Secondary | ICD-10-CM

## 2019-02-06 DIAGNOSIS — I491 Atrial premature depolarization: Secondary | ICD-10-CM | POA: Diagnosis not present

## 2019-02-06 DIAGNOSIS — N186 End stage renal disease: Secondary | ICD-10-CM | POA: Insufficient documentation

## 2019-02-06 DIAGNOSIS — N1330 Unspecified hydronephrosis: Secondary | ICD-10-CM | POA: Diagnosis not present

## 2019-02-06 DIAGNOSIS — I447 Left bundle-branch block, unspecified: Secondary | ICD-10-CM | POA: Insufficient documentation

## 2019-02-06 DIAGNOSIS — J449 Chronic obstructive pulmonary disease, unspecified: Secondary | ICD-10-CM | POA: Diagnosis not present

## 2019-02-06 DIAGNOSIS — I480 Paroxysmal atrial fibrillation: Secondary | ICD-10-CM | POA: Insufficient documentation

## 2019-02-06 DIAGNOSIS — I2510 Atherosclerotic heart disease of native coronary artery without angina pectoris: Secondary | ICD-10-CM | POA: Insufficient documentation

## 2019-02-06 DIAGNOSIS — I132 Hypertensive heart and chronic kidney disease with heart failure and with stage 5 chronic kidney disease, or end stage renal disease: Secondary | ICD-10-CM | POA: Diagnosis not present

## 2019-02-06 NOTE — Patient Instructions (Addendum)
Ask Kidney doctor about stopping Metolazone and Torsemide  Your physician has requested that you have an echocardiogram. Echocardiography is a painless test that uses sound waves to create images of your heart. It provides your doctor with information about the size and shape of your heart and how well your heart's chambers and valves are working. This procedure takes approximately one hour. There are no restrictions for this procedure.  You will get a call to schedule this appointment.   Your physician recommends that you schedule a follow-up appointment in: 6 months with Dr Aundra Dubin.  You will get a call to schedule an appointment.   At the [facility], you and your health needs are our priority. As part of our continuing mission to provide you with exceptional heart care, we have created designated Provider Care Teams. These Care Teams include your primary Cardiologist (physician) and Advanced Practice Providers (APPs- Physician Assistants and Nurse Practitioners) who all work together to provide you with the care you need, when you need it.   You may see any of the following providers on your designated Care Team at your next follow up: Marland Kitchen Dr Glori Bickers . Dr Loralie Champagne . Darrick Grinder, NP . Lyda Jester, PA . Audry Riles, PharmD   Please be sure to bring in all your medications bottles to every appointment.

## 2019-02-07 NOTE — Progress Notes (Signed)
Advanced Heart Failure Clinic Note  PCP: Alroy Dust, L.Marlou Sa, MD HF Cardiology: Dr. Aundra Dubin  78 y.o.with history of ESRD, paroxysmal atrial fibrillation, and chronic diastolic CHF was referred by Dr. Wynonia Lawman for evaluation of CHF.  RHC/LHC was done in 10/15.  This showed markedly elevated filling pressures and pulmonary venous hypertension.  There was no significant CAD.  Last echo in 2/18 showed EF 50-55%, mild LVH, grade 2 diastolic dysfunction. She is followed by Dr. Moshe Cipro for nephrology.   Admitted 10/2-10/6/19 with SOB. RHC showed elevated filling pressures and preserved CO. She was diuresed with IV lasix, then transitioned to torsemide 80 mg am, 40 mg pm. Repeat echo showed EF 45-50% with normal RV. PYP scan was negative for TTR amyloid. PFTs showed restrictive disease. CT chest showed no ILD but did show right hydronephrosis. Renal US showed single kidney with mild to moderate hydronephrosis on right (unchanged since 2011).  DC weight 208 lbs.    In 12/20, she progressed to ESRD and was started on HD.    She presents today for followup of CHF.  She is still taking torsemide 80 mg bid and metolazone 2.5 mg daily though she urinates little.  She has tolerated HD so far.  She is short of breath chronically after walking 30-40 feet.  No chest pain.  She uses a walker, gets short of breath walking to her car.  She has burning/tingling sensation in her feet.   Labs (2/18): K 4, creatinine 1.79 Labs (10/19): K 4.5, creatinine 2.4 Labs (12/19): LDL 62 Labs (1/20): K 3.9, creatinine 2.72 Labs (12/20): hgb 11.9  PMH: 1. Atrial fibrillation: Paroxysmal.  2. HTN 3. 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  5. ESRD 6. H/o IVCD 7. COPD: PFTs (10/19) actually showed severe restriction, concern for interstitial process.  CT chest (10/19) showed mild-moderate patchy air trapping but no evidence for interstitial lung disease.  8. Gout 9. Chronic primarily diastolic CHF: Echo  (8/27) with EF 50-55%, mild LVH, grade 2 diastolic dysfunction.  - LHC/RHC (10/15): small distal LAD but no discrete stenosis.  Mean RA 15, PA 55/20 mean 34, mean PCWP 31 => pulmonary venous hypertension.  - PYP scan (10/19): No evidence for TTR amyloidosis.  - Echo (10/19): EF 45-50%, mild LVH, normal RV size and systolic function.  - RHC (10/19): mean RA 13, PA 74/27 mean 50, mean PCWP 28, CI 3.12, PVR 3.6 WU. Pulmonary venous hypertension.  10. Sleep study 2016: Negative per patient.   Review of systems complete and found to be negative unless listed in HPI.    Social History   Socioeconomic History  . Marital status: Widowed    Spouse name: Not on file  . Number of children: 2  . Years of education: Not on file  . Highest education level: Not on file  Occupational History  . Occupation: retired  Tobacco Use  . Smoking status: Former Smoker    Packs/day: 1.00    Years: 20.00    Pack years: 20.00    Types: Cigarettes    Quit date: 02/22/2009    Years since quitting: 9.9  . Smokeless tobacco: Never Used  Substance and Sexual Activity  . Alcohol use: Yes    Alcohol/week: 0.0 standard drinks    Comment: occasional beer  . Drug use: Not Currently    Comment: marijuana in the past  . Sexual activity: Never  Other Topics Concern  . Not on file  Social History Narrative  . Not on file  Social Determinants of Health   Financial Resource Strain:   . Difficulty of Paying Living Expenses: Not on file  Food Insecurity:   . Worried About Charity fundraiser in the Last Year: Not on file  . Ran Out of Food in the Last Year: Not on file  Transportation Needs:   . Lack of Transportation (Medical): Not on file  . Lack of Transportation (Non-Medical): Not on file  Physical Activity:   . Days of Exercise per Week: Not on file  . Minutes of Exercise per Session: Not on file  Stress:   . Feeling of Stress : Not on file  Social Connections:   . Frequency of Communication with  Friends and Family: Not on file  . Frequency of Social Gatherings with Friends and Family: Not on file  . Attends Religious Services: Not on file  . Active Member of Clubs or Organizations: Not on file  . Attends Archivist Meetings: Not on file  . Marital Status: Not on file  Intimate Partner Violence:   . Fear of Current or Ex-Partner: Not on file  . Emotionally Abused: Not on file  . Physically Abused: Not on file  . Sexually Abused: Not on file   Family History  Problem Relation Age of Onset  . Lupus Daughter   . Cancer Mother 45  . CVA Father 86  . Cancer Brother 45   Review of systems complete and found to be negative unless listed in HPI.    Current Outpatient Medications  Medication Sig Dispense Refill  . albuterol (PROVENTIL HFA;VENTOLIN HFA) 108 (90 BASE) MCG/ACT inhaler Inhale 2 puffs into the lungs every 4 (four) hours as needed for shortness of breath.     Marland Kitchen apixaban (ELIQUIS) 5 MG TABS tablet Take 1 tablet (5 mg total) by mouth 2 (two) times daily. 60 tablet 0  . atorvastatin (LIPITOR) 80 MG tablet Take 80 mg by mouth every evening.     . calcitRIOL (ROCALTROL) 0.25 MCG capsule Take 0.25 mcg by mouth daily.    . Continuous Blood Gluc Receiver (FREESTYLE LIBRE READER) DEVI by Does not apply route.    . Continuous Blood Gluc Sensor (FREESTYLE LIBRE 14 DAY SENSOR) MISC by Does not apply route.    . fluticasone (CUTIVATE) 0.005 % ointment Apply 1 application topically 2 (two) times daily as needed (skin irritation/rash).   2  . guaiFENesin (MUCINEX) 600 MG 12 hr tablet Take 600 mg by mouth 2 (two) times daily as needed for cough.     . hydrALAZINE (APRESOLINE) 100 MG tablet Take 1 tablet (100 mg total) by mouth 3 (three) times daily. 270 tablet 3  . insulin aspart (NOVOLOG) 100 UNIT/ML injection Inject 10 Units into the skin daily with lunch.     . insulin degludec (TRESIBA FLEXTOUCH) 100 UNIT/ML SOPN FlexTouch Pen Inject 25 Units into the skin daily.     .  isosorbide mononitrate (IMDUR) 30 MG 24 hr tablet Take 1 tablet by mouth once daily 30 tablet 0  . latanoprost (XALATAN) 0.005 % ophthalmic solution Place 1 drop into both eyes at bedtime.    . metolazone (ZAROXOLYN) 2.5 MG tablet Take 2.5 mg by mouth daily.    . metoprolol succinate (TOPROL-XL) 25 MG 24 hr tablet Take 2 tablets (50 mg total) by mouth every morning AND 1 tablet (25 mg total) every evening. 90 tablet 6  . NARCAN 4 MG/0.1ML LIQD nasal spray kit Place 1 spray into the nose See  admin instructions. Call EMS at first sign of opioid overdose. Place on spray in nose.  If no response, then place one spray ing the opposite side of the nose.    . ondansetron (ZOFRAN) 4 MG tablet Take 4 mg by mouth 2 (two) times daily as needed for nausea.     . pantoprazole (PROTONIX) 40 MG tablet Take 40 mg by mouth daily.     Marland Kitchen torsemide (DEMADEX) 20 MG tablet Take 56m by mouth three times daily, may take an additional 466m(2 tabs) as needed for fluid or swelling.    . traMADol (ULTRAM) 50 MG tablet Take 50 mg by mouth every 6 (six) hours as needed for severe pain (for pain.).     . Marland Kitchenriamcinolone cream (KENALOG) 0.1 % Apply 1 application topically 2 (two) times daily as needed (skin rash/irritation.).      No current facility-administered medications for this encounter.   Today's Vitals   02/06/19 1115  BP: (!) 114/35  Pulse: (!) 51  SpO2: 97%  Weight: 92.4 kg (203 lb 12.8 oz)   Body mass index is 36.1 kg/m. Wt Readings from Last 3 Encounters:  02/06/19 92.4 kg (203 lb 12.8 oz)  02/02/19 94.4 kg (208 lb 3.2 oz)  01/31/19 91 kg (200 lb 9.9 oz)   General: NAD Neck: JVP 8-9 cm, no thyromegaly or thyroid nodule.  Lungs: Clear to auscultation bilaterally with normal respiratory effort. CV: Nondisplaced PMI.  Heart regular S1/S2, no S3/S4, no murmur.  Trace ankle edema.  No carotid bruit.  Normal pedal pulses.  Abdomen: Soft, nontender, no hepatosplenomegaly, no distention.  Skin: Intact without  lesions or rashes.  Neurologic: Alert and oriented x 3.  Psych: Normal affect. Extremities: No clubbing or cyanosis.  HEENT: Normal.   Assessment/Plan: 1. Chronic primarily diastolic CHF: Echo in 2/7/86ith EF 50-55%, mild LVH, grade 2 diastolic dysfunction. 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 Cardiac output preserved. Echo (10/19) with EF 45-50%, septal-lateral dyssynchrony, RV looked ok.  PYP scan was not suggestive of transthyretin amyloidosis.  NYHA class III symptoms, volume now controlled by HD, she still takes diuretics but has minimal UOP. - Given HD-dependence and only small amount of urine produced, I think that she could stop torsemide and metolazone.  However, I asked that she check in with her nephrologist about this.  - I am going to arrange for repeat echo.  2. COPD: She no longer smokes. Restrictive PFTs. High rest CT negative for ILD.  3. HTN: BP controlled.   4. CKD: Stage 3.  BMET today. 5. Atrial fibrillation: Paroxysmal.  She is in NSR today.  - Continue apixaban 5 mg bid  6. Pulmonary HTN: Moderate to severe pulmonary hypertension on 10/19 RHC. PVR 3.6 WU. This appeared to be primarily pulmonary venous hypertension.  - No treatment with selective pulmonary vasodilators for now.  7. Right hydronephrosis: Renal USKoreahowed single kidney with mild to moderate hydronephrosis on right (unchanged since 2011).   Followup 6 months.   DaLoralie ChampagneMD  02/07/2019

## 2019-02-12 ENCOUNTER — Ambulatory Visit: Payer: Self-pay

## 2019-02-12 DIAGNOSIS — K5900 Constipation, unspecified: Secondary | ICD-10-CM | POA: Diagnosis not present

## 2019-02-12 DIAGNOSIS — N189 Chronic kidney disease, unspecified: Secondary | ICD-10-CM | POA: Diagnosis not present

## 2019-02-12 DIAGNOSIS — R1084 Generalized abdominal pain: Secondary | ICD-10-CM | POA: Diagnosis not present

## 2019-02-19 DIAGNOSIS — M79672 Pain in left foot: Secondary | ICD-10-CM | POA: Diagnosis not present

## 2019-02-19 DIAGNOSIS — N184 Chronic kidney disease, stage 4 (severe): Secondary | ICD-10-CM | POA: Diagnosis not present

## 2019-02-19 DIAGNOSIS — I10 Essential (primary) hypertension: Secondary | ICD-10-CM | POA: Diagnosis not present

## 2019-02-19 DIAGNOSIS — E1121 Type 2 diabetes mellitus with diabetic nephropathy: Secondary | ICD-10-CM | POA: Diagnosis not present

## 2019-02-22 ENCOUNTER — Ambulatory Visit: Payer: Self-pay

## 2019-02-22 DIAGNOSIS — Z992 Dependence on renal dialysis: Secondary | ICD-10-CM | POA: Diagnosis not present

## 2019-02-22 DIAGNOSIS — I129 Hypertensive chronic kidney disease with stage 1 through stage 4 chronic kidney disease, or unspecified chronic kidney disease: Secondary | ICD-10-CM | POA: Diagnosis not present

## 2019-02-22 DIAGNOSIS — N186 End stage renal disease: Secondary | ICD-10-CM | POA: Diagnosis not present

## 2019-02-25 DIAGNOSIS — T8249XD Other complication of vascular dialysis catheter, subsequent encounter: Secondary | ICD-10-CM | POA: Diagnosis not present

## 2019-02-25 DIAGNOSIS — N186 End stage renal disease: Secondary | ICD-10-CM | POA: Diagnosis not present

## 2019-02-25 DIAGNOSIS — Z992 Dependence on renal dialysis: Secondary | ICD-10-CM | POA: Diagnosis not present

## 2019-02-25 DIAGNOSIS — N2581 Secondary hyperparathyroidism of renal origin: Secondary | ICD-10-CM | POA: Diagnosis not present

## 2019-02-25 DIAGNOSIS — Z23 Encounter for immunization: Secondary | ICD-10-CM | POA: Diagnosis not present

## 2019-02-25 DIAGNOSIS — Z283 Underimmunization status: Secondary | ICD-10-CM | POA: Diagnosis not present

## 2019-02-25 DIAGNOSIS — D631 Anemia in chronic kidney disease: Secondary | ICD-10-CM | POA: Diagnosis not present

## 2019-02-25 DIAGNOSIS — D509 Iron deficiency anemia, unspecified: Secondary | ICD-10-CM | POA: Diagnosis not present

## 2019-02-25 DIAGNOSIS — D689 Coagulation defect, unspecified: Secondary | ICD-10-CM | POA: Diagnosis not present

## 2019-02-25 DIAGNOSIS — E1122 Type 2 diabetes mellitus with diabetic chronic kidney disease: Secondary | ICD-10-CM | POA: Diagnosis not present

## 2019-03-02 ENCOUNTER — Other Ambulatory Visit: Payer: Self-pay

## 2019-03-02 ENCOUNTER — Other Ambulatory Visit (HOSPITAL_COMMUNITY): Payer: Self-pay

## 2019-03-02 DIAGNOSIS — I5022 Chronic systolic (congestive) heart failure: Secondary | ICD-10-CM

## 2019-03-02 NOTE — Patient Outreach (Signed)
  [place] [facility]) Care Management Chronic Special Needs Program    03/02/2019  Name: Stephanie Woodward, DOB: 08/11/1940  MRN: [id number]   Stephanie Woodward is enrolled in a chronic special needs plan for Heart Failure. Telephone call to client for initial assessment/ HRA review. Unable to reach. HIPAA compliant voice message left with call back phone number and return call request.   PLAN; RNCM will attempt 2nd telephone call to client within 1 week.  Quinn Plowman RN,BSN,CCM [place] [facility] [phone]

## 2019-03-05 ENCOUNTER — Ambulatory Visit (HOSPITAL_COMMUNITY)
Admission: RE | Admit: 2019-03-05 | Discharge: 2019-03-05 | Disposition: A | Discharge status: Home / Self Care | Payer: HMO | Source: Ambulatory Visit | Attending: Family Medicine | Admitting: Family Medicine

## 2019-03-05 ENCOUNTER — Other Ambulatory Visit: Payer: Self-pay

## 2019-03-05 DIAGNOSIS — I313 Pericardial effusion (noninflammatory): Secondary | ICD-10-CM | POA: Insufficient documentation

## 2019-03-05 DIAGNOSIS — E669 Obesity, unspecified: Secondary | ICD-10-CM | POA: Diagnosis not present

## 2019-03-05 DIAGNOSIS — I5022 Chronic systolic (congestive) heart failure: Secondary | ICD-10-CM | POA: Insufficient documentation

## 2019-03-05 DIAGNOSIS — N189 Chronic kidney disease, unspecified: Secondary | ICD-10-CM | POA: Diagnosis not present

## 2019-03-05 DIAGNOSIS — R0602 Shortness of breath: Secondary | ICD-10-CM | POA: Insufficient documentation

## 2019-03-05 DIAGNOSIS — R079 Chest pain, unspecified: Secondary | ICD-10-CM | POA: Insufficient documentation

## 2019-03-05 DIAGNOSIS — I4891 Unspecified atrial fibrillation: Secondary | ICD-10-CM | POA: Insufficient documentation

## 2019-03-05 DIAGNOSIS — I272 Pulmonary hypertension, unspecified: Secondary | ICD-10-CM | POA: Insufficient documentation

## 2019-03-05 DIAGNOSIS — I130 Hypertensive heart and chronic kidney disease with heart failure and stage 1 through stage 4 chronic kidney disease, or unspecified chronic kidney disease: Secondary | ICD-10-CM | POA: Insufficient documentation

## 2019-03-05 DIAGNOSIS — E118 Type 2 diabetes mellitus with unspecified complications: Secondary | ICD-10-CM | POA: Diagnosis not present

## 2019-03-05 NOTE — Progress Notes (Signed)
  Echocardiogram 2D Echocardiogram has been performed.  Anay Rathe A Josetta Wigal 03/05/2019, 10:47 AM

## 2019-03-07 ENCOUNTER — Telehealth (HOSPITAL_COMMUNITY): Payer: Self-pay

## 2019-03-07 ENCOUNTER — Other Ambulatory Visit (HOSPITAL_COMMUNITY): Payer: Self-pay | Admitting: Cardiology

## 2019-03-07 ENCOUNTER — Ambulatory Visit: Payer: Self-pay

## 2019-03-07 MED ORDER — APIXABAN 5 MG PO TABS: 5.0000 mg | ORAL_TABLET | Freq: Two times a day (BID) | ORAL | 3 refills | Status: DC

## 2019-03-07 NOTE — Telephone Encounter (Signed)
Pt called needing a refill on eliquis. Refills sent to preferred pharmacy. No further questions/ needs at this time.

## 2019-03-09 ENCOUNTER — Ambulatory Visit: Payer: Self-pay

## 2019-03-09 ENCOUNTER — Other Ambulatory Visit: Payer: Self-pay

## 2019-03-09 NOTE — Patient Outreach (Signed)
  [place] [facility]) Care Management Chronic Special Needs Program    03/09/2019  Name: Stephanie Woodward, DOB: 1940/12/15  MRN: [id number]   [name] is enrolled in a chronic special needs plan for Diabetes. Telephone call to client for initial assessment/ HRA review. Unable to reach. HIPAA compliant voice message left with call back phone number and return call request.   PLAN; RNCM will attempt 3rd telephone call to client within 1 week.   Quinn Plowman RN,BSN,CCM [place] [facility] [phone]

## 2019-03-09 NOTE — Patient Outreach (Signed)
[place] [facility]) Care Management Chronic Special Needs Program  03/09/2019  Name: Stephanie Woodward DOB: [birth year]  MRN: [id number]  [name] is enrolled in a chronic special needs plan for Diabetes. Chronic Care Management Coordinator telephoned client to review health risk assessment and to develop individualized care plan.  Introduced the chronic care management program, importance of client participation, and taking their care plan to all provider appointments and inpatient facilities.    Subjective: Client reports she is doing well. Client states she is on dialysis Tuesday, Thursday and Saturday. Client reports being on a fluid restriction of 32 oz. She states she is working with the registered dietician, Stephanie Woodward at the dialysis clinic who currently has her on a meal plan.   She states she has felt much better since beginning dialysis in December 2020. Client states she started seeing an endocrinologist in December 2020 for her diabetes. Client reports her most recent A1c is 8.9. She reports checking her blood sugars at least 4 times per day. She states she enjoys using the [employer].  Client reports she was frequently having low readings daily prior to her primary care provider adjusting the dosage of her diabetes medications. Client states she has not had any further low blood reading since the end of December. 2020. Client states she also has CHF and COPD. RNCM discussed signs/symptoms of heart failure / COPD. Advised client contact her doctor for mild/ moderate symptoms and call 911 for severe symptoms. Client verbalized understanding.  Client states she will be scheduling an appointment to see a podiatrist. Client reports checking her blood pressure on alternate days from dialysis. Client reports her blood pressure is doing very well with a most recent reading of 138/80.  RNCM advised client to notify MD of any changes in condition prior to scheduled  appointment. Client advised to contact RNCM as needed and contact their HTA concierge for benefit questions.  RNCM provided client 24 hour HTA nurse advise line number [phone]    Goals Addressed            This Visit's Progress   .  Acknowledge receipt of Programme researcher, broadcasting/film/video      . Client understands the importance of follow-up with providers by attending scheduled visits       Primary MD visit 02/19/19 Endocrinologist visit 02/02/19    . Client verbalize knowledge of Heart Failure disease self management skills in 3 months       RN case manager reviewed signs/ symptoms of heart failure. Client advised to contact her doctor regarding heart failure symptoms and/ or contact 911 for severe symptoms.  RNCM will send EMMI education article to client regarding heart failure: Heart failure: Working with your doctor.  Advised to continue to adhere to low salt diet.     . Client will verbalize knowledge of chronic lung disease as evidenced by no ED visits or Inpatient stays related to chronic lung disease        RN Case manager discussed signs/ symptoms of COPD. Advised client to contact her doctor for symptoms and / or contact 911 for severe symptoms.  RN case manager will send client EMMI education article: COPD: What you can do    . Client will verbalize knowledge of self management of Hypertension as evidences by BP reading of 140/90 or less; or as defined by provider       RN case manager assessed hypertension self management skills. Ensured client has home blood pressure  monitor.  Discussed/ Reviewed blood pressure targets.   Mailed client EMMI education article: High Blood pressure: What you can do.    . Maintain timely refills of diabetic medication as prescribed within the year .       Client reports good medication taking behavior.   Review of clients medical record medication report indicates client maintains timely refills of diabetic medications.        Assessment: Client is meeting diabetes self-management goal of hemoglobin A1C of <7.0% with most recent reading of 8.9% on 12/2 /20 without recent reports of hypoglycemia . RNCM will send client Advanced directive packet. Advised client to contact RNCM if assistance with Advanced directive is needed.  Client has good understanding of:  COVID-19 cause, symptoms, precautions (social distancing, stay at home order, hand washing, wear face covering when unable to maintain or ensure 6 foot social distancing), and symptoms requiring provider notification.  Plan:  Send successful outreach letter with a copy of their individualized care plan, Send individual care plan to provider and Send educational material:  COPD: What you can do, High blood pressure: What you can do, and Heart failure: Working with your doctor.   Chronic care management coordination will outreach in:  3 Months     Quinn Plowman RN,BSN,CCM [place] [facility] [phone]

## 2019-03-13 DIAGNOSIS — M5136 Other intervertebral disc degeneration, lumbar region: Secondary | ICD-10-CM | POA: Diagnosis not present

## 2019-03-13 DIAGNOSIS — Z5181 Encounter for therapeutic drug level monitoring: Secondary | ICD-10-CM | POA: Diagnosis not present

## 2019-03-13 DIAGNOSIS — M259 Joint disorder, unspecified: Secondary | ICD-10-CM | POA: Diagnosis not present

## 2019-03-13 DIAGNOSIS — G894 Chronic pain syndrome: Secondary | ICD-10-CM | POA: Diagnosis not present

## 2019-03-13 DIAGNOSIS — M545 Low back pain: Secondary | ICD-10-CM | POA: Diagnosis not present

## 2019-03-14 DIAGNOSIS — Z452 Encounter for adjustment and management of vascular access device: Secondary | ICD-10-CM | POA: Diagnosis not present

## 2019-03-20 ENCOUNTER — Other Ambulatory Visit: Payer: Self-pay

## 2019-03-20 NOTE — Patient Outreach (Addendum)
  [place] [facility]) Care Management Chronic Special Needs Program    03/20/2019  Name: Stephanie Woodward, DOB: 1940/03/20  MRN: [id number]   [name] is enrolled in a chronic special needs plan for Heart Failure.  Return telephone call to client. Unable to reach. HIPAA compliant voice message left with call back phone number.   PLAN: RNCM will attempt 2nd telephone call to patient within 1 week.   Quinn Plowman RN,BSN,CCM [facility] Telephonic  [phone]

## 2019-03-23 ENCOUNTER — Ambulatory Visit: Payer: Self-pay

## 2019-03-25 DIAGNOSIS — I129 Hypertensive chronic kidney disease with stage 1 through stage 4 chronic kidney disease, or unspecified chronic kidney disease: Secondary | ICD-10-CM | POA: Diagnosis not present

## 2019-03-25 DIAGNOSIS — N186 End stage renal disease: Secondary | ICD-10-CM | POA: Diagnosis not present

## 2019-03-25 DIAGNOSIS — Z992 Dependence on renal dialysis: Secondary | ICD-10-CM | POA: Diagnosis not present

## 2019-03-26 ENCOUNTER — Other Ambulatory Visit: Payer: Self-pay

## 2019-03-26 NOTE — Patient Outreach (Signed)
  [place] [facility]) Care Management Chronic Special Needs Program    03/26/2019  Name: Jamileth Putzier, DOB: [birth year]  MRN: [id number]   Ms. Emmagene Ortner is enrolled in a chronic special needs plan for Heart Failure. Return call to [employer]. HIPAA verified. Client states she was able to get her question answered.  States she does not have any further questions or concerns. Client advised to contact RNCM if further assistance needed.   PLAN: RNCM will follow up with client at next scheduled tier level visit.   Quinn Plowman RN,BSN,CCM [place] [facility] [phone]

## 2019-03-27 DIAGNOSIS — Z23 Encounter for immunization: Secondary | ICD-10-CM | POA: Diagnosis not present

## 2019-03-27 DIAGNOSIS — Z992 Dependence on renal dialysis: Secondary | ICD-10-CM | POA: Diagnosis not present

## 2019-03-27 DIAGNOSIS — D631 Anemia in chronic kidney disease: Secondary | ICD-10-CM | POA: Diagnosis not present

## 2019-03-27 DIAGNOSIS — N2581 Secondary hyperparathyroidism of renal origin: Secondary | ICD-10-CM | POA: Diagnosis not present

## 2019-03-27 DIAGNOSIS — Z283 Underimmunization status: Secondary | ICD-10-CM | POA: Diagnosis not present

## 2019-03-27 DIAGNOSIS — R079 Chest pain, unspecified: Secondary | ICD-10-CM | POA: Diagnosis not present

## 2019-03-27 DIAGNOSIS — D689 Coagulation defect, unspecified: Secondary | ICD-10-CM | POA: Diagnosis not present

## 2019-03-27 DIAGNOSIS — N186 End stage renal disease: Secondary | ICD-10-CM | POA: Diagnosis not present

## 2019-03-27 DIAGNOSIS — D509 Iron deficiency anemia, unspecified: Secondary | ICD-10-CM | POA: Diagnosis not present

## 2019-04-02 ENCOUNTER — Other Ambulatory Visit: Payer: Self-pay

## 2019-04-04 ENCOUNTER — Ambulatory Visit (INDEPENDENT_AMBULATORY_CARE_PROVIDER_SITE_OTHER): Payer: HMO | Admitting: Internal Medicine

## 2019-04-04 ENCOUNTER — Other Ambulatory Visit: Payer: Self-pay

## 2019-04-04 ENCOUNTER — Encounter: Payer: Self-pay | Admitting: Internal Medicine

## 2019-04-04 VITALS — BP 118/78 | HR 88 | Temp 98.6°F | Ht 61.0 in | Wt 196.8 lb

## 2019-04-04 DIAGNOSIS — Z794 Long term (current) use of insulin: Secondary | ICD-10-CM

## 2019-04-04 DIAGNOSIS — E1165 Type 2 diabetes mellitus with hyperglycemia: Secondary | ICD-10-CM

## 2019-04-04 LAB — POCT GLYCOSYLATED HEMOGLOBIN (HGB A1C): Hemoglobin A1C: 7.1 % — AB (ref 4.0–5.6)

## 2019-04-04 MED ORDER — TRESIBA FLEXTOUCH 100 UNIT/ML ~~LOC~~ SOPN: 20.0000 [IU] | PEN_INJECTOR | Freq: Every day | SUBCUTANEOUS | 6 refills | Status: DC

## 2019-04-04 MED ORDER — NOVOLOG FLEXPEN 100 UNIT/ML ~~LOC~~ SOPN: PEN_INJECTOR | SUBCUTANEOUS | 6 refills | Status: DC

## 2019-04-04 NOTE — Patient Instructions (Addendum)
-   Continue  Tresiba at 20 units daily   - Novolog 8 units with Breakfast and lunch but increase to 10 units with Supper  - Novolog correctional insulin: You can use this scale if your sugar is too high but its not time for you to eat yet.   Blood sugar before meal Number of units to inject  Less than 175 0 unit  176 -  210 1 units  211 -  245 2 units  246 -  280 3 units  281 -  315 4 units  316 -  350 5 units      HOW TO TREAT LOW BLOOD SUGARS (Blood sugar LESS THAN 70 MG/DL)  Please follow the RULE OF 15 for the treatment of hypoglycemia treatment (when your (blood sugars are less than 70 mg/dL)    STEP 1: Take 15 grams of carbohydrates when your blood sugar is low, which includes:   3-4 GLUCOSE TABS  OR  3-4 OZ OF JUICE OR REGULAR SODA OR  ONE TUBE OF GLUCOSE GEL     STEP 2: RECHECK blood sugar in 15 MINUTES STEP 3: If your blood sugar is still low at the 15 minute recheck --> then, go back to STEP 1 and treat AGAIN with another 15 grams of carbohydrates.

## 2019-04-04 NOTE — Progress Notes (Signed)
Name: Stephanie Woodward  Age/ Sex: 79 y.o., female   MRN/ DOB: [id number], [birth year]     PCP: Alroy Dust, L.Marlou Sa, MD   Reason for Endocrinology Evaluation: Type 2 Diabetes Mellitus  Initial Endocrine Consultative Visit: 02/02/2019    PATIENT IDENTIFIER: [name] is a 78 y.o. female with a past medical history ofT2DM, HTN, ESRD on HD and CHF . The patient has followed with Endocrinology clinic since 02/02/2019 for consultative assistance with management of her diabetes.  DIABETIC HISTORY:  [name] was diagnosed with T2DM in 2005. She has been on oral glycemic agents in the past, recalls metformin but does not recall the rest. Her hemoglobin A1c has ranged from 8.8% in 2018, peaking at 8.9% in 2020.  On her initial visit to our clinic she had an A1c of 8.9%, she was on tresiba and Novolog. We continued the regimen but adjusted her doses.    SUBJECTIVE:   During the last visit (02/02/2019): A1c 8.9%. Decreased Tresiba and Novolog.   Today (04/04/2019): [name] is here for a 2 month follow up on diabetes.  She checks her blood sugars multiple times daily, the the CGM. The patient has  had hypoglycemic episodes since the last clinic visit. Otherwise, the patient has not required any recent emergency interventions for hypoglycemia and has not had recent hospitalizations secondary to hyper or hypoglycemic episodes.    ROS: As per HPI and as detailed below: Review of Systems  Respiratory: Positive for shortness of breath.        Improving       HOME DIABETES REGIMEN:  Tresiba 20 units  Novolog 8 units TIODQAC    Statin: Yes ACE-I/ARB: No    CONTINUOUS GLUCOSE MONITORING RECORD INTERPRETATION    Dates of Recording: 1/28-2/11/2019  Sensor description:Freestyle   Results statistics:   CGM use % of time 59  Average and SD 169/28.8  Time in range       58 %  % Time Above 180 36  % Time above 250 5  % Time Below target 1     Glycemic patterns summary:  hyperglycemia with breakfast and supper  Hyperglycemic episodes  Post prandial   Hypoglycemic episodes occurred during the day after a bolus   Overnight periods: trends down without hypoglycemia    DIABETIC COMPLICATIONS: Microvascular complications:   ESRD on HD   Denies: neuropathy , retinopathy   Last eye exam: Completed 2019  Macrovascular complications:   CHF  Denies: CAD, PVD, CVA  HISTORY:  Past Medical History:  Past Medical History:  Diagnosis Date  . Arthritis   . Asthma   . Atrial fibrillation ([place])   . CHF (congestive heart failure) ([place])   . CKD (chronic kidney disease), stage III   . COPD (chronic obstructive pulmonary disease) ([place])   . Diabetes mellitus    INSULIN DEPENDENT  . Gout   . Heart murmur    no issues per pt  . History of kidney stones   . Hyperlipemia   . Hypertension   . Psoriasis   . Renal disorder    congenital  . Single kidney   . Sleep apnea    doesn't use the Cpap   Past Surgical History:  Past Surgical History:  Procedure Laterality Date  . ABDOMINAL HYSTERECTOMY    . AV FISTULA PLACEMENT Left 11/17/2018   Procedure: BRACHIO-CEPHALIC ARTERIOVENOUS (AV) FISTULA CREATION IN LEFT ARM;  Surgeon: Marty Heck, MD;  Location: [place];  Service: Vascular;  Laterality: Left;  . CHOLECYSTECTOMY    . INSERTION OF DIALYSIS CATHETER Right 01/26/2019   Procedure: INSERTION OF DIALYSIS CATHETER, right internal jugular;  Surgeon: Angelia Mould, MD;  Location: [place];  Service: Vascular;  Laterality: Right;  . LEFT AND RIGHT HEART CATHETERIZATION WITH CORONARY ANGIOGRAM N/A 11/29/2013   Procedure: LEFT AND RIGHT HEART CATHETERIZATION WITH CORONARY ANGIOGRAM;  Surgeon: Jacolyn Reedy, MD;  Location: [facility] CATH LAB;  Service: Cardiovascular;  Laterality: N/A;  . RIGHT HEART CATH N/A 11/23/2017   Procedure: RIGHT HEART CATH;  Surgeon: Larey Dresser, MD;  Location: [place] CV LAB;  Service: Cardiovascular;  Laterality: N/A;     Social History:  reports that she quit smoking about 10 years ago. Her smoking use included cigarettes. She has a 20.00 pack-year smoking history. She has never used smokeless tobacco. She reports current alcohol use. She reports previous drug use. Family History:  Family History  Problem Relation Age of Onset  . Lupus Daughter   . Cancer Mother 32  . CVA Father 65  . Cancer Brother 58     HOME MEDICATIONS: Allergies as of 04/04/2019      Reactions   Eggs Or Egg-derived Products Nausea And Vomiting   Lisinopril Nausea And Vomiting   Penicillins Nausea And Vomiting   Has patient had a PCN reaction causing immediate rash, facial/tongue/throat swelling, SOB or lightheadedness with hypotension: No Has patient had a PCN reaction causing severe rash involving mucus membranes or skin necrosis: No Has patient had a PCN reaction that required hospitalization: No Has patient had a PCN reaction occurring within the last 10 years: No If all of the above answers are "NO", then may proceed with Cephalosporin use.      Medication List       Accurate as of April 04, 2019  2:02 PM. If you have any questions, ask your nurse or doctor.        albuterol 108 (90 Base) MCG/ACT inhaler Commonly known as: VENTOLIN HFA Inhale 2 puffs into the lungs every 4 (four) hours as needed for shortness of breath.   apixaban 5 MG Tabs tablet Commonly known as: ELIQUIS Take 1 tablet (5 mg total) by mouth 2 (two) times daily.   atorvastatin 80 MG tablet Commonly known as: LIPITOR Take 80 mg by mouth every evening.   calcitRIOL 0.25 MCG capsule Commonly known as: ROCALTROL Take 0.25 mcg by mouth daily.   fluticasone 0.005 % ointment Commonly known as: CUTIVATE Apply 1 application topically 2 (two) times daily as needed (skin irritation/rash).   FreeStyle Libre 14 Day Sensor Misc by Does not apply route.   FreeStyle [employer] by Does not apply route.   guaiFENesin 600 MG 12 hr tablet  Commonly known as: MUCINEX Take 600 mg by mouth 2 (two) times daily as needed for cough.   hydrALAZINE 100 MG tablet Commonly known as: APRESOLINE Take 1 tablet (100 mg total) by mouth 3 (three) times daily.   insulin aspart 100 UNIT/ML injection Commonly known as: novoLOG Inject 10 Units into the skin daily with lunch.   isosorbide mononitrate 30 MG 24 hr tablet Commonly known as: IMDUR Take 1 tablet by mouth once daily   latanoprost 0.005 % ophthalmic solution Commonly known as: XALATAN Place 1 drop into both eyes at bedtime.   metolazone 2.5 MG tablet Commonly known as: ZAROXOLYN Take 2.5 mg by mouth daily.   metoprolol succinate 25 MG 24 hr tablet Commonly known as: TOPROL-XL  Take 2 tablets (50 mg total) by mouth every morning AND 1 tablet (25 mg total) every evening.   Narcan 4 MG/0.1ML Liqd nasal spray kit Generic drug: naloxone Place 1 spray into the nose See admin instructions. Call EMS at first sign of opioid overdose. Place on spray in nose.  If no response, then place one spray ing the opposite side of the nose.   ondansetron 4 MG tablet Commonly known as: ZOFRAN Take 4 mg by mouth 2 (two) times daily as needed for nausea.   pantoprazole 40 MG tablet Commonly known as: PROTONIX Take 40 mg by mouth daily.   torsemide 20 MG tablet Commonly known as: DEMADEX Take 22m by mouth three times daily, may take an additional 458m(2 tabs) as needed for fluid or swelling.   traMADol 50 MG tablet Commonly known as: ULTRAM Take 50 mg by mouth every 6 (six) hours as needed for severe pain (for pain.).   TrTyler AaslexTouch 100 UNIT/ML Sopn FlexTouch Pen Generic drug: insulin degludec Inject 25 Units into the skin daily.   triamcinolone cream 0.1 % Commonly known as: KENALOG Apply 1 application topically 2 (two) times daily as needed (skin rash/irritation.).        OBJECTIVE:   Vital Signs: BP 118/78 (BP Location: Right Arm, Patient Position: Sitting, Cuff  Size: Large)   Pulse 88   Temp 98.6 F (37 C)   Ht '5\' 1"'  (1.549 m)   Wt 196 lb 12.8 oz (89.3 kg)   SpO2 98%   BMI 37.19 kg/m   Wt Readings from Last 3 Encounters:  04/04/19 196 lb 12.8 oz (89.3 kg)  02/06/19 203 lb 12.8 oz (92.4 kg)  02/02/19 208 lb 3.2 oz (94.4 kg)     Exam: General: Pt appears well and is in NAD  Lungs: Clear with good BS bilat with no rales, rhonchi, or wheezes  Heart: RRR with normal S1 and S2 and no gallops; no murmurs; no rub  Abdomen: Normoactive bowel sounds, soft, nontender, without masses or organomegaly palpable  Extremities: No pretibial edema.  Neuro: MS is good with appropriate affect, pt is alert and Ox3    DM foot exam:04/04/2019      The skin of the feet is intact without sores or ulcerations. Pt has a chronic rash on the right medial aspect of leg  The pedal pulses are 1+ on right and 1+ on left. The sensation is intact to a screening 5.07, 10 gram monofilament bilaterally        DATA REVIEWED:  Lab Results  Component Value Date   HGBA1C 7.1 (A) 04/04/2019   HGBA1C 8.9 (H) 01/24/2019   HGBA1C 8.8 (H) 03/10/2016   Lab Results  Component Value Date   LDLCALC 97 03/27/2007   CREATININE 2.58 (H) 01/31/2019   No results found for: [facility] Lab Results  Component Value Date   CHOL 205 (H) 03/27/2007   HDL 79 03/27/2007   LDLCALC 97 03/27/2007   TRIG 147 03/27/2007   CHOLHDL 2.6 Ratio 03/27/2007         ASSESSMENT / PLAN / RECOMMENDATIONS:   1) Type 2 Diabetes Mellitus, Optimally controlled, With ESRD  on HD- Most recent A1c of 7.1  %. Goal A1c < 7.5 %.    - Dramatic improvement in her glycemic control  - Reduction in the risk of hypoglycemia, in the past 2 weeks  She has been noted with one episode during the day following a BG of 301 mg/dL.  I suspect she took the prandial dose after the meal which results in hypoglycemia.  - We again emphasized the importance of taking novolog with meals.  -  Sometimes she doesn;t  take it with lunch if she is at dialysis, so I will also provide her with a correction scale when needed - Pt has not been taking novolog with breakfast, she has been taking [place], discussed the difference between the two types of insulin.    MEDICATIONS: - Continue  Tresiba at 20 units daily  - Novolog 8 units with Breakfast and lunch but increase to 10 units with Supper - CF : Novolog (BG-140/35)   EDUCATION / INSTRUCTIONS:  BG monitoring instructions: Patient is instructed to check her blood sugars 4 times a day, before meals and bedtime .  Call [place] [facility] if: BG persistently < 70 or > 300. . I reviewed the Rule of 15 for the treatment of hypoglycemia in detail with the patient. Literature supplied.    F/U in 3 months    Signed electronically by: Mack Guise, MD  [facility] Endocrinology  [facility] [address] [place], [place] [address] Phone: [phone] FAX: [phone]   CC: [address]a [address]Marlou Sa, [place] [address] [place] [address] Phone: [phone]  Fax: [phone]  Return to Endocrinology clinic as below: Future Appointments  Date Time Provider [place]  05/21/2019 10:30 AM Marzetta Board, DPM [facility]  06/04/2019  9:30 AM Dannielle Karvonen, RN [facility] None  07/02/2019 10:10 AM Shamleffer, Melanie Crazier, MD [facility] None

## 2019-04-06 ENCOUNTER — Other Ambulatory Visit (HOSPITAL_COMMUNITY): Payer: Self-pay | Admitting: Cardiology

## 2019-04-10 DIAGNOSIS — G894 Chronic pain syndrome: Secondary | ICD-10-CM | POA: Diagnosis not present

## 2019-04-18 ENCOUNTER — Other Ambulatory Visit: Payer: Self-pay

## 2019-04-18 MED ORDER — FREESTYLE LIBRE 14 DAY SENSOR MISC: 0 refills | Status: DC

## 2019-04-19 ENCOUNTER — Ambulatory Visit: Payer: HMO

## 2019-04-20 ENCOUNTER — Ambulatory Visit: Payer: HMO | Attending: Internal Medicine

## 2019-04-20 DIAGNOSIS — Z23 Encounter for immunization: Secondary | ICD-10-CM | POA: Insufficient documentation

## 2019-04-20 NOTE — Progress Notes (Signed)
   Covid-19 Vaccination Clinic  Name:  Stephanie Woodward    MRN: [id number] DOB: Mar 16, 1940  04/20/2019  [name] was observed post Covid-19 immunization for 15 minutes without incidence. She was provided with Vaccine Information Sheet and instruction to access the V-Safe system.   [name] was instructed to call 911 with any severe reactions post vaccine: Marland Kitchen Difficulty breathing  . Swelling of your face and throat  . A fast heartbeat  . A bad rash all over your body  . Dizziness and weakness    Immunizations Administered    Name Date Dose VIS Date Route   Pfizer COVID-19 Vaccine 04/20/2019  9:05 AM 0.3 mL 02/02/2019 Intramuscular   Manufacturer: [place]   Lot: [id number]   [place]: [id number]

## 2019-04-22 DIAGNOSIS — Z992 Dependence on renal dialysis: Secondary | ICD-10-CM | POA: Diagnosis not present

## 2019-04-22 DIAGNOSIS — I129 Hypertensive chronic kidney disease with stage 1 through stage 4 chronic kidney disease, or unspecified chronic kidney disease: Secondary | ICD-10-CM | POA: Diagnosis not present

## 2019-04-22 DIAGNOSIS — N186 End stage renal disease: Secondary | ICD-10-CM | POA: Diagnosis not present

## 2019-04-24 DIAGNOSIS — N2581 Secondary hyperparathyroidism of renal origin: Secondary | ICD-10-CM | POA: Diagnosis not present

## 2019-04-24 DIAGNOSIS — D631 Anemia in chronic kidney disease: Secondary | ICD-10-CM | POA: Diagnosis not present

## 2019-04-24 DIAGNOSIS — D689 Coagulation defect, unspecified: Secondary | ICD-10-CM | POA: Diagnosis not present

## 2019-04-24 DIAGNOSIS — Z23 Encounter for immunization: Secondary | ICD-10-CM | POA: Diagnosis not present

## 2019-04-24 DIAGNOSIS — D509 Iron deficiency anemia, unspecified: Secondary | ICD-10-CM | POA: Diagnosis not present

## 2019-04-24 DIAGNOSIS — Z283 Underimmunization status: Secondary | ICD-10-CM | POA: Diagnosis not present

## 2019-04-24 DIAGNOSIS — N186 End stage renal disease: Secondary | ICD-10-CM | POA: Diagnosis not present

## 2019-04-24 DIAGNOSIS — Z992 Dependence on renal dialysis: Secondary | ICD-10-CM | POA: Diagnosis not present

## 2019-04-25 ENCOUNTER — Telehealth: Payer: Self-pay | Admitting: Internal Medicine

## 2019-04-25 ENCOUNTER — Other Ambulatory Visit: Payer: Self-pay

## 2019-04-25 MED ORDER — FREESTYLE LIBRE 14 DAY SENSOR MISC: 0 refills | Status: DC

## 2019-04-25 NOTE — Telephone Encounter (Signed)
Pt can transfer medications if it is from one [facility] with a call to pharmacy. But I have resent this to requested pharmacy.

## 2019-04-25 NOTE — Telephone Encounter (Signed)
Patient called asking if we could please [facility] [address] [facility] [address]   [place], [address] Phone:  [phone]  Fax:  [phone]

## 2019-05-02 ENCOUNTER — Telehealth (HOSPITAL_COMMUNITY): Payer: Self-pay

## 2019-05-02 NOTE — Telephone Encounter (Signed)
Attempted to contact PT va phone; no answer.  Left voicemail asking PT to contact [facility] to schedule a follow-up appt for week of 3/15.

## 2019-05-02 NOTE — Telephone Encounter (Signed)
-----   Message from Scarlette Calico, RN sent at 05/01/2019 12:24 PM EST ----- Pt needs appt w/app next week please, ok use a 3:30 slot thanks

## 2019-05-03 NOTE — Telephone Encounter (Signed)
Patient left message on triage line-reports office called her to schedule an appt  Please return call for appt

## 2019-05-04 ENCOUNTER — Other Ambulatory Visit: Payer: Self-pay

## 2019-05-04 MED ORDER — FREESTYLE LIBRE READER DEVI: 1.0000 | Freq: Every day | 0 refills | Status: DC

## 2019-05-07 ENCOUNTER — Other Ambulatory Visit (HOSPITAL_COMMUNITY): Payer: Self-pay | Admitting: Cardiology

## 2019-05-16 ENCOUNTER — Ambulatory Visit: Payer: HMO | Attending: Internal Medicine

## 2019-05-16 DIAGNOSIS — Z23 Encounter for immunization: Secondary | ICD-10-CM

## 2019-05-16 NOTE — Progress Notes (Signed)
   Covid-19 Vaccination Clinic  Name:  Stephanie Woodward    MRN: [id number] DOB: 1940-12-25  05/16/2019  [name] was observed post Covid-19 immunization for 15 minutes without incident. She was provided with Vaccine Information Sheet and instruction to access the V-Safe system.   [name] was instructed to call 911 with any severe reactions post vaccine: Marland Kitchen Difficulty breathing  . Swelling of face and throat  . A fast heartbeat  . A bad rash all over body  . Dizziness and weakness   Immunizations Administered    Name Date Dose VIS Date Route   Pfizer COVID-19 Vaccine 05/16/2019  9:11 AM 0.3 mL 02/02/2019 Intramuscular   Manufacturer: [place]   Lot: [id number]   [place]: [id number]

## 2019-05-21 ENCOUNTER — Encounter: Payer: Self-pay | Admitting: Podiatry

## 2019-05-21 ENCOUNTER — Other Ambulatory Visit: Payer: Self-pay

## 2019-05-21 ENCOUNTER — Ambulatory Visit: Payer: HMO | Admitting: Podiatry

## 2019-05-21 VITALS — Temp 97.3°F

## 2019-05-21 DIAGNOSIS — M2012 Hallux valgus (acquired), left foot: Secondary | ICD-10-CM

## 2019-05-21 DIAGNOSIS — Z992 Dependence on renal dialysis: Secondary | ICD-10-CM | POA: Diagnosis not present

## 2019-05-21 DIAGNOSIS — M2011 Hallux valgus (acquired), right foot: Secondary | ICD-10-CM | POA: Diagnosis not present

## 2019-05-21 DIAGNOSIS — M79674 Pain in right toe(s): Secondary | ICD-10-CM | POA: Diagnosis not present

## 2019-05-21 DIAGNOSIS — M79675 Pain in left toe(s): Secondary | ICD-10-CM | POA: Diagnosis not present

## 2019-05-21 DIAGNOSIS — E0822 Diabetes mellitus due to underlying condition with diabetic chronic kidney disease: Secondary | ICD-10-CM

## 2019-05-21 DIAGNOSIS — Z794 Long term (current) use of insulin: Secondary | ICD-10-CM | POA: Diagnosis not present

## 2019-05-21 DIAGNOSIS — B351 Tinea unguium: Secondary | ICD-10-CM | POA: Diagnosis not present

## 2019-05-21 DIAGNOSIS — L84 Corns and callosities: Secondary | ICD-10-CM | POA: Diagnosis not present

## 2019-05-21 DIAGNOSIS — N186 End stage renal disease: Secondary | ICD-10-CM

## 2019-05-21 NOTE — Patient Instructions (Addendum)
Diabetes Mellitus and Foot Care Foot care is an important part of your health, especially when you have diabetes. Diabetes may cause you to have problems because of poor blood flow (circulation) to your feet and legs, which can cause your skin to:  Become thinner and drier.  Break more easily.  Heal more slowly.  Peel and crack. You may also have nerve damage (neuropathy) in your legs and feet, causing decreased feeling in them. This means that you may not notice minor injuries to your feet that could lead to more serious problems. Noticing and addressing any potential problems early is the best way to prevent future foot problems. How to care for your feet Foot hygiene  Wash your feet daily with warm water and mild soap. Do not use hot water. Then, pat your feet and the areas between your toes until they are completely dry. Do not soak your feet as this can dry your skin.  Trim your toenails straight across. Do not dig under them or around the cuticle. File the edges of your nails with an emery board or nail file.  Apply a moisturizing lotion or petroleum jelly to the skin on your feet and to dry, brittle toenails. Use lotion that does not contain alcohol and is unscented. Do not apply lotion between your toes. Shoes and socks  Wear clean socks or stockings every day. Make sure they are not too tight. Do not wear knee-high stockings since they may decrease blood flow to your legs.  Wear shoes that fit properly and have enough cushioning. Always look in your shoes before you put them on to be sure there are no objects inside.  To break in new shoes, wear them for just a few hours a day. This prevents injuries on your feet. Wounds, scrapes, corns, and calluses  Check your feet daily for blisters, cuts, bruises, sores, and redness. If you cannot see the bottom of your feet, use a mirror or ask someone for help.  Do not cut corns or calluses or try to remove them with medicine.  If you  find a minor scrape, cut, or break in the skin on your feet, keep it and the skin around it clean and dry. You may clean these areas with mild soap and water. Do not clean the area with peroxide, alcohol, or iodine.  If you have a wound, scrape, corn, or callus on your foot, look at it several times a day to make sure it is healing and not infected. Check for: ? Redness, swelling, or pain. ? Fluid or blood. ? Warmth. ? Pus or a bad smell. General instructions  Do not cross your legs. This may decrease blood flow to your feet.  Do not use heating pads or hot water bottles on your feet. They may burn your skin. If you have lost feeling in your feet or legs, you may not know this is happening until it is too late.  Protect your feet from hot and cold by wearing shoes, such as at the beach or on hot pavement.  Schedule a complete foot exam at least once a year (annually) or more often if you have foot problems. If you have foot problems, report any cuts, sores, or bruises to your health care provider immediately. Contact a health care provider if:  You have a medical condition that increases your risk of infection and you have any cuts, sores, or bruises on your feet.  You have an injury that is not   healing.  You have redness on your legs or feet.  You feel burning or tingling in your legs or feet.  You have pain or cramps in your legs and feet.  Your legs or feet are numb.  Your feet always feel cold.  You have pain around a toenail. Get help right away if:  You have a wound, scrape, corn, or callus on your foot and: ? You have pain, swelling, or redness that gets worse. ? You have fluid or blood coming from the wound, scrape, corn, or callus. ? Your wound, scrape, corn, or callus feels warm to the touch. ? You have pus or a bad smell coming from the wound, scrape, corn, or callus. ? You have a fever. ? You have a red line going up your leg. Summary  Check your feet every day  for cuts, sores, red spots, swelling, and blisters.  Moisturize feet and legs daily.  Wear shoes that fit properly and have enough cushioning.  If you have foot problems, report any cuts, sores, or bruises to your health care provider immediately.  Schedule a complete foot exam at least once a year (annually) or more often if you have foot problems. This information is not intended to replace advice given to you by your health care provider. Make sure you discuss any questions you have with your health care provider. Document Revised: 11/01/2018 Document Reviewed: 03/12/2016 Elsevier Patient Education  2020 Elsevier Inc.  Corns and Calluses Corns are small areas of thickened skin that occur on the top, sides, or tip of a toe. They contain a cone-shaped core with a point that can press on a nerve below. This causes pain.  Calluses are areas of thickened skin that can occur anywhere on the body, including the hands, fingers, palms, soles of the feet, and heels. Calluses are usually larger than corns. What are the causes? Corns and calluses are caused by rubbing (friction) or pressure, such as from shoes that are too tight or do not fit properly. What increases the risk? Corns are more likely to develop in people who have misshapen toes (toe deformities), such as hammer toes. Calluses can occur with friction to any area of the skin. They are more likely to develop in people who:  Work with their hands.  Wear shoes that fit poorly, are too tight, or are high-heeled.  Have toe deformities. What are the signs or symptoms? Symptoms of a corn or callus include:  A hard growth on the skin.  Pain or tenderness under the skin.  Redness and swelling.  Increased discomfort while wearing tight-fitting shoes, if your feet are affected. If a corn or callus becomes infected, symptoms may include:  Redness and swelling that gets worse.  Pain.  Fluid, blood, or pus draining from the corn or  callus. How is this diagnosed? Corns and calluses may be diagnosed based on your symptoms, your medical history, and a physical exam. How is this treated? Treatment for corns and calluses may include:  Removing the cause of the friction or pressure. This may involve: ? Changing your shoes. ? Wearing shoe inserts (orthotics) or other protective layers in your shoes, such as a corn pad. ? Wearing gloves.  Applying medicine to the skin (topical medicine) to help soften skin in the hardened, thickened areas.  Removing layers of dead skin with a file to reduce the size of the corn or callus.  Removing the corn or callus with a scalpel or laser.  Taking   antibiotic medicines, if your corn or callus is infected.  Having surgery, if a toe deformity is the cause. Follow these instructions at home:   Take over-the-counter and prescription medicines only as told by your health care provider.  If you were prescribed an antibiotic, take it as told by your health care provider. Do not stop taking it even if your condition starts to improve.  Wear shoes that fit well. Avoid wearing high-heeled shoes and shoes that are too tight or too loose.  Wear any padding, protective layers, gloves, or orthotics as told by your health care provider.  Soak your hands or feet and then use a file or pumice stone to soften your corn or callus. Do this as told by your health care provider.  Check your corn or callus every day for symptoms of infection. Contact a health care provider if you:  Notice that your symptoms do not improve with treatment.  Have redness or swelling that gets worse.  Notice that your corn or callus becomes painful.  Have fluid, blood, or pus coming from your corn or callus.  Have new symptoms. Summary  Corns are small areas of thickened skin that occur on the top, sides, or tip of a toe.  Calluses are areas of thickened skin that can occur anywhere on the body, including the  hands, fingers, palms, and soles of the feet. Calluses are usually larger than corns.  Corns and calluses are caused by rubbing (friction) or pressure, such as from shoes that are too tight or do not fit properly.  Treatment may include wearing any padding, protective layers, gloves, or orthotics as told by your health care provider. This information is not intended to replace advice given to you by your health care provider. Make sure you discuss any questions you have with your health care provider. Document Revised: 05/31/2018 Document Reviewed: 12/22/2016 Elsevier Patient Education  2020 Elsevier Inc.  

## 2019-05-22 NOTE — Progress Notes (Signed)
Subjective: Stephanie Woodward presents today referred by Alroy Dust, L.Marlou Sa, MD for diabetic foot evaluation.  Patient relates greater than 15 year history of diabetes. She also has ESRD and has been on dialysis since December 2020.   Patient denies any history of foot wounds.  Patient denies any history of numbness, tingling, burning, pins/needles sensations.  Today, patient c/o of painful, discolored, thick toenails which interfere with daily activities.  Pain is aggravated when wearing enclosed shoe gear.   Past Medical History:  Diagnosis Date  . Arthritis   . Asthma   . Atrial fibrillation ([place])   . CHF (congestive heart failure) ([place])   . CKD (chronic kidney disease), stage III   . COPD (chronic obstructive pulmonary disease) ([place])   . Diabetes mellitus    INSULIN DEPENDENT  . Gout   . Heart murmur    no issues per pt  . History of kidney stones   . Hyperlipemia   . Hypertension   . Psoriasis   . Renal disorder    congenital  . Single kidney   . Sleep apnea    doesn't use the Cpap    Patient Active Problem List   Diagnosis Date Noted  . Type 2 diabetes mellitus with hyperglycemia, with long-term current use of insulin ([place]) 02/02/2019  . 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 Type 2 diabetes mellitus with stage 4 chronic kidney disease, with long-term current use of insulin ([place]) 02/02/2019  . Acute renal failure superimposed on chronic kidney disease ([place]) 01/24/2019  . Leukocytosis 01/24/2019  . Hypokalemia 01/24/2019  . Atrial fibrillation, chronic ([place]) 01/24/2019  . Chronic anticoagulation 01/24/2019  . Chronic kidney disease (CKD), stage IV (severe) ([place]) 10/31/2018  . Atrial fibrillation with RVR ([place]) 04/08/2016  . Chest pain 04/04/2016  . Acute on chronic renal failure ([place]) 03/09/2016  . Diabetes mellitus with complication ([place]) 82/95/6213  . Foot pain, bilateral 03/09/2016  . Renal disorder   . Bradycardia    . CHF (congestive heart failure) ([place]) 02/06/2014  . Cellulitis of right lower extremity 02/06/2014  . Essential hypertension 02/06/2014  . Hyperlipidemia 02/06/2014  . Uncontrolled type 2 diabetes mellitus with hypoglycemia ([place]) 02/06/2014  . Cellulitis 02/06/2014  . OSA (obstructive sleep apnea) 01/22/2014  . Hypoxemia 12/07/2013  . Type 2 diabetes mellitus, controlled, with renal complications ([place]) 08/65/7846  . Kidney disease, chronic, stage III (GFR 30-59 ml/min) ([place]) 12/07/2013  . Pulmonary hypertension ([place]) 12/07/2013  . Chronic systolic congestive heart failure ([place])   . CHF exacerbation ([place]) 12/05/2013  . SOB (shortness of breath) 12/05/2013  . Gout 11/11/2009  . Obesity 11/11/2009  . Hypertensive heart disease     Past Surgical History:  Procedure Laterality Date  . ABDOMINAL HYSTERECTOMY    . AV FISTULA PLACEMENT Left 11/17/2018   Procedure: BRACHIO-CEPHALIC ARTERIOVENOUS (AV) FISTULA CREATION IN LEFT ARM;  Surgeon: Marty Heck, MD;  Location: [facility];  Service: Vascular;  Laterality: Left;  . CHOLECYSTECTOMY    . INSERTION OF DIALYSIS CATHETER Right 01/26/2019   Procedure: INSERTION OF DIALYSIS CATHETER, right internal jugular;  Surgeon: Angelia Mould, MD;  Location: [facility];  Service: Vascular;  Laterality: Right;  . LEFT AND RIGHT HEART CATHETERIZATION WITH CORONARY ANGIOGRAM N/A 11/29/2013   Procedure: LEFT AND RIGHT HEART CATHETERIZATION WITH CORONARY ANGIOGRAM;  Surgeon: Jacolyn Reedy, MD;  Location: [facility] CATH LAB;  Service: Cardiovascular;  Laterality: N/A;  . RIGHT HEART CATH N/A 11/23/2017   Procedure: RIGHT  HEART CATH;  Surgeon: Larey Dresser, MD;  Location: [facility] CV LAB;  Service: Cardiovascular;  Laterality: N/A;    Current Outpatient Medications on File Prior to Visit  Medication Sig Dispense Refill  . albuterol (PROVENTIL HFA;VENTOLIN HFA) 108 (90 BASE) MCG/ACT inhaler Inhale 2 puffs into the lungs every 4 (four) hours as needed  for shortness of breath.     Marland Kitchen apixaban (ELIQUIS) 5 MG TABS tablet Take 1 tablet (5 mg total) by mouth 2 (two) times daily. 60 tablet 3  . atorvastatin (LIPITOR) 80 MG tablet Take 80 mg by mouth every evening.     . B Complex-C-Zn-Folic Acid (DIALYVITE 017-BLTJ 15) 0.8 MG TABS Take by mouth.    . Continuous Blood Gluc Receiver (FREESTYLE LIBRE READER) DEVI 1 kit by Does not apply route daily. Use as instructed E11.65 1 each 0  . Continuous Blood Gluc Sensor (FREESTYLE LIBRE 14 DAY SENSOR) MISC 1 every 14 days E11.65 6 each 0  . ethyl chloride spray SMARTSIG:1 Spray(s) Topical 3 Times a Week    . fluticasone (CUTIVATE) 0.005 % ointment Apply 1 application topically 2 (two) times daily as needed (skin irritation/rash).   2  . guaiFENesin (MUCINEX) 600 MG 12 hr tablet Take 600 mg by mouth 2 (two) times daily as needed for cough.     . hydrALAZINE (APRESOLINE) 100 MG tablet Take 1 tablet (100 mg total) by mouth 3 (three) times daily. 270 tablet 3  . insulin aspart (NOVOLOG FLEXPEN) 100 UNIT/ML FlexPen Inject 8 Units into the skin 2 (two) times daily with breakfast and lunch AND 10 Units daily with supper. 15 mL 6  . insulin degludec (TRESIBA FLEXTOUCH) 100 UNIT/ML SOPN FlexTouch Pen Inject 0.2 mLs (20 Units total) into the skin daily. 15 mL 6  . isosorbide mononitrate (IMDUR) 30 MG 24 hr tablet Take 1 tablet by mouth once daily 90 tablet 3  . latanoprost (XALATAN) 0.005 % ophthalmic solution Place 1 drop into both eyes at bedtime.    . metoprolol succinate (TOPROL-XL) 25 MG 24 hr tablet Take 2 tablets (50 mg total) by mouth every morning AND 1 tablet (25 mg total) every evening. 90 tablet 6  . NARCAN 4 MG/0.1ML LIQD nasal spray kit Place 1 spray into the nose See admin instructions. Call EMS at first sign of opioid overdose. Place on spray in nose.  If no response, then place one spray ing the opposite side of the nose.    . ondansetron (ZOFRAN) 4 MG tablet Take 4 mg by mouth 2 (two) times daily as  needed for nausea.     . pantoprazole (PROTONIX) 40 MG tablet Take 40 mg by mouth daily.     Marland Kitchen torsemide (DEMADEX) 20 MG tablet Take 66m by mouth three times daily, may take an additional 477m(2 tabs) as needed for fluid or swelling.    . traMADol (ULTRAM) 50 MG tablet Take 50 mg by mouth every 6 (six) hours as needed for severe pain (for pain.).     . Marland Kitchenriamcinolone cream (KENALOG) 0.1 % Apply 1 application topically 2 (two) times daily as needed (skin rash/irritation.).      No current facility-administered medications on file prior to visit.     Allergies  Allergen Reactions  . Eggs Or Egg-Derived Products Nausea And Vomiting  . Lisinopril Nausea And Vomiting  . Penicillins Nausea And Vomiting    Has patient had a PCN reaction causing immediate rash, facial/tongue/throat swelling, SOB or lightheadedness with hypotension:  No Has patient had a PCN reaction causing severe rash involving mucus membranes or skin necrosis: No Has patient had a PCN reaction that required hospitalization: No Has patient had a PCN reaction occurring within the last 10 years: No If all of the above answers are "NO", then may proceed with Cephalosporin use.     Social History   Occupational History  . Occupation: retired  Tobacco Use  . Smoking status: Former Smoker    Packs/day: 1.00    Years: 20.00    Pack years: 20.00    Types: Cigarettes    Quit date: 02/22/2009    Years since quitting: 10.2  . Smokeless tobacco: Never Used  Substance and Sexual Activity  . Alcohol use: Yes    Alcohol/week: 0.0 standard drinks    Comment: occasional beer  . Drug use: Not Currently    Comment: marijuana in the past  . Sexual activity: Never    Family History  Problem Relation Age of Onset  . Lupus Daughter   . Cancer Mother 69  . CVA Father 31  . Cancer Brother 65    Immunization History  Administered Date(s) Administered  . PFIZER SARS-COV-2 Vaccination 04/20/2019, 05/16/2019    Review of systems:  Positive Findings in bold print.  Constitutional:  chills, fatigue, fever, sweats, weight change Communication: Optometrist, sign Ecologist, hand writing, iPad/Android device Head: headaches, head injury Eyes: changes in vision, eye pain, glaucoma, cataracts, macular degeneration, diplopia, glare,  light sensitivity, eyeglasses or contacts, blindness Ears nose mouth throat: hearing impaired, hearing aids,  ringing in ears, deaf, sign language,  vertigo, nosebleeds,  rhinitis,  cold sores, snoring, swollen glands Cardiovascular: HTN, edema, arrhythmia, pacemaker in place, defibrillator in place, chest pain/tightness, chronic anticoagulation, blood clot, heart failure, MI Peripheral Vascular: leg cramps, varicose veins, blood clots, lymphedema, varicosities Respiratory:  difficulty breathing, denies congestion, SOB, wheezing, cough, emphysema Gastrointestinal: change in appetite or weight, abdominal pain, constipation, diarrhea, nausea, vomiting, vomiting blood, change in bowel habits, abdominal pain, jaundice, rectal bleeding, hemorrhoids, GERD Genitourinary:  nocturia,  pain on urination, polyuria,  blood in urine, Foley catheter, urinary urgency, ESRD on hemodialysis Musculoskeletal: amputation, cramping, stiff joints, painful joints, decreased joint motion, fractures, OA, gout, hemiplegia, paraplegia, uses cane, wheelchair bound, uses walker, uses rollator Skin: +changes in toenails, color change, dryness, itching, mole changes,  rash, wound(s) Neurological: headaches, numbness in feet, paresthesias in feet, burning in feet, fainting,  seizures, change in speech,  headaches, memory problems/poor historian, cerebral palsy, weakness, paralysis, CVA, TIA Endocrine: diabetes, hypothyroidism, hyperthyroidism,  goiter, dry mouth, flushing, heat intolerance,  cold intolerance,  excessive thirst, denies polyuria,  nocturia Hematological:  easy bleeding, excessive bleeding, easy bruising, enlarged  lymph nodes, on long term blood thinner, history of past transusions Allergy/immunological:  hives, eczema, frequent infections, multiple drug allergies, seasonal allergies, transplant recipient, multiple food allergies Psychiatric:  anxiety, depression, mood disorder, suicidal ideations, hallucinations, insomnia  Objective: Vitals:   05/21/19 1057  Temp: (!) 97.3 F (36.3 C)    79 y.o. AA female WD, WN IN NAD. AAO X 3.  Vascular Examination: Capillary refill time to digits immediate b/l. Palpable DP pulses b/l. Palpable PT pulses b/l. Pedal hair absent b/l Skin temperature gradient within normal limits b/l.  Dermatological Examination: Pedal skin with normal turgor, texture and tone bilaterally. No open wounds bilaterally. No interdigital macerations bilaterally. Toenails 1-5 b/l elongated, dystrophic, thickened, crumbly with subungual debris and tenderness to dorsal palpation. Hyperkeratotic lesion(s) submet head 1 right foot.  No erythema, no edema, no drainage, no flocculence.  Musculoskeletal Examination: Normal muscle strength 5/5 to all lower extremity muscle groups bilaterally, no pain crepitus or joint limitation noted with ROM b/l and bunion deformity noted b/l  Neurological Examination: Protective sensation intact 5/5 intact bilaterally with 10g monofilament b/l Vibratory sensation intact b/l Proprioception intact bilaterally  Assessment: 1. Pain due to onychomycosis of toenails of both feet   2. Callus   3. Hallux valgus, acquired, bilateral   4. Diabetes mellitus due to underlying condition with chronic kidney disease on chronic dialysis, with long-term current use of insulin ([place])     Plan: -Diabetic foot examination performed on today's visit. -Discussed diabetic foot care principles. Literature dispensed on today. -Toenails 1-5 b/l were debrided in length and girth with sterile nail nippers and dremel without iatrogenic bleeding.  -Callus(es) submet head 1 right  foot were debrided without complication or incident. Total number debrided =1. -Patient to continue soft, supportive shoe gear daily. -Patient to report any pedal injuries to medical professional immediately. -Patient/POA to call should there be question/concern in the interim.  Return for diabetic nail and callus trim.

## 2019-05-23 DIAGNOSIS — I129 Hypertensive chronic kidney disease with stage 1 through stage 4 chronic kidney disease, or unspecified chronic kidney disease: Secondary | ICD-10-CM | POA: Diagnosis not present

## 2019-05-23 DIAGNOSIS — N186 End stage renal disease: Secondary | ICD-10-CM | POA: Diagnosis not present

## 2019-05-23 DIAGNOSIS — Z992 Dependence on renal dialysis: Secondary | ICD-10-CM | POA: Diagnosis not present

## 2019-05-24 DIAGNOSIS — N2581 Secondary hyperparathyroidism of renal origin: Secondary | ICD-10-CM | POA: Diagnosis not present

## 2019-05-24 DIAGNOSIS — Z992 Dependence on renal dialysis: Secondary | ICD-10-CM | POA: Diagnosis not present

## 2019-05-24 DIAGNOSIS — N186 End stage renal disease: Secondary | ICD-10-CM | POA: Diagnosis not present

## 2019-05-24 DIAGNOSIS — D509 Iron deficiency anemia, unspecified: Secondary | ICD-10-CM | POA: Diagnosis not present

## 2019-05-24 DIAGNOSIS — D631 Anemia in chronic kidney disease: Secondary | ICD-10-CM | POA: Diagnosis not present

## 2019-05-24 DIAGNOSIS — E1122 Type 2 diabetes mellitus with diabetic chronic kidney disease: Secondary | ICD-10-CM | POA: Diagnosis not present

## 2019-05-24 DIAGNOSIS — D689 Coagulation defect, unspecified: Secondary | ICD-10-CM | POA: Diagnosis not present

## 2019-06-04 ENCOUNTER — Other Ambulatory Visit: Payer: Self-pay

## 2019-06-04 ENCOUNTER — Ambulatory Visit (HOSPITAL_COMMUNITY)
Admission: RE | Admit: 2019-06-04 | Discharge: 2019-06-04 | Disposition: A | Discharge status: Home / Self Care | Payer: HMO | Source: Ambulatory Visit | Attending: Cardiology | Admitting: Cardiology

## 2019-06-04 ENCOUNTER — Encounter (HOSPITAL_COMMUNITY): Payer: Self-pay

## 2019-06-04 VITALS — BP 120/64 | HR 80 | Wt 193.4 lb

## 2019-06-04 DIAGNOSIS — Z7901 Long term (current) use of anticoagulants: Secondary | ICD-10-CM | POA: Diagnosis not present

## 2019-06-04 DIAGNOSIS — R0600 Dyspnea, unspecified: Secondary | ICD-10-CM

## 2019-06-04 DIAGNOSIS — R0609 Other forms of dyspnea: Secondary | ICD-10-CM

## 2019-06-04 DIAGNOSIS — I5032 Chronic diastolic (congestive) heart failure: Secondary | ICD-10-CM | POA: Insufficient documentation

## 2019-06-04 DIAGNOSIS — Z794 Long term (current) use of insulin: Secondary | ICD-10-CM | POA: Diagnosis not present

## 2019-06-04 DIAGNOSIS — Z79899 Other long term (current) drug therapy: Secondary | ICD-10-CM | POA: Insufficient documentation

## 2019-06-04 DIAGNOSIS — N1330 Unspecified hydronephrosis: Secondary | ICD-10-CM | POA: Insufficient documentation

## 2019-06-04 DIAGNOSIS — Z961 Presence of intraocular lens: Secondary | ICD-10-CM | POA: Diagnosis not present

## 2019-06-04 DIAGNOSIS — I272 Pulmonary hypertension, unspecified: Secondary | ICD-10-CM | POA: Insufficient documentation

## 2019-06-04 DIAGNOSIS — H401131 Primary open-angle glaucoma, bilateral, mild stage: Secondary | ICD-10-CM | POA: Diagnosis not present

## 2019-06-04 DIAGNOSIS — Z87891 Personal history of nicotine dependence: Secondary | ICD-10-CM | POA: Insufficient documentation

## 2019-06-04 DIAGNOSIS — N186 End stage renal disease: Secondary | ICD-10-CM | POA: Insufficient documentation

## 2019-06-04 DIAGNOSIS — E119 Type 2 diabetes mellitus without complications: Secondary | ICD-10-CM | POA: Diagnosis not present

## 2019-06-04 DIAGNOSIS — E1122 Type 2 diabetes mellitus with diabetic chronic kidney disease: Secondary | ICD-10-CM | POA: Insufficient documentation

## 2019-06-04 DIAGNOSIS — E785 Hyperlipidemia, unspecified: Secondary | ICD-10-CM | POA: Diagnosis not present

## 2019-06-04 DIAGNOSIS — Z992 Dependence on renal dialysis: Secondary | ICD-10-CM | POA: Insufficient documentation

## 2019-06-04 DIAGNOSIS — I480 Paroxysmal atrial fibrillation: Secondary | ICD-10-CM | POA: Insufficient documentation

## 2019-06-04 DIAGNOSIS — J449 Chronic obstructive pulmonary disease, unspecified: Secondary | ICD-10-CM | POA: Diagnosis not present

## 2019-06-04 DIAGNOSIS — I2510 Atherosclerotic heart disease of native coronary artery without angina pectoris: Secondary | ICD-10-CM | POA: Diagnosis not present

## 2019-06-04 DIAGNOSIS — I132 Hypertensive heart and chronic kidney disease with heart failure and with stage 5 chronic kidney disease, or end stage renal disease: Secondary | ICD-10-CM | POA: Insufficient documentation

## 2019-06-04 LAB — BASIC METABOLIC PANEL
Anion gap: 13 (ref 5–15)
BUN: 47 mg/dL — ABNORMAL HIGH (ref 8–23)
CO2: 30 mmol/L (ref 22–32)
Calcium: 9 mg/dL (ref 8.9–10.3)
Chloride: 97 mmol/L — ABNORMAL LOW (ref 98–111)
Creatinine, Ser: 5.53 mg/dL — ABNORMAL HIGH (ref 0.44–1.00)
GFR calc Af Amer: 8 mL/min — ABNORMAL LOW (ref >60)
GFR calc non Af Amer: 7 mL/min — ABNORMAL LOW (ref >60)
Glucose, Bld: 124 mg/dL — ABNORMAL HIGH (ref 70–99)
Potassium: 3.8 mmol/L (ref 3.5–5.1)
Sodium: 140 mmol/L (ref 135–145)

## 2019-06-04 LAB — CBC
HCT: 38.8 % (ref 36.0–46.0)
Hemoglobin: 12.3 g/dL (ref 12.0–15.0)
MCH: 30.5 pg (ref 26.0–34.0)
MCHC: 31.7 g/dL (ref 30.0–36.0)
MCV: 96.3 fL (ref 80.0–100.0)
Platelets: 296 10*3/uL (ref 150–400)
RBC: 4.03 MIL/uL (ref 3.87–5.11)
RDW: 14.1 % (ref 11.5–15.5)
WBC: 12.3 10*3/uL — ABNORMAL HIGH (ref 4.0–10.5)
nRBC: 0 % (ref 0.0–0.2)

## 2019-06-04 NOTE — H&P (View-Only) (Signed)
Advanced Heart Failure Clinic Note  PCP: Alroy Dust, L.Marlou Sa, MD HF Cardiology: Dr. Aundra Dubin  79 y.o.with history of ESRD, paroxysmal atrial fibrillation, and chronic diastolic CHF was referred by Dr. Wynonia Lawman for evaluation of CHF.  RHC/LHC was done in 10/15.  This showed markedly elevated filling pressures and pulmonary venous hypertension.  There was no significant CAD.  Last echo in 2/18 showed EF 50-55%, mild LVH, grade 2 diastolic dysfunction. She is followed by Dr. Moshe Cipro for nephrology.   Admitted 10/2-10/6/19 with SOB. RHC showed elevated filling pressures and preserved CO. She was diuresed with IV lasix, then transitioned to torsemide 80 mg am, 40 mg pm. Repeat echo showed EF 45-50% with normal RV. PYP scan was negative for TTR amyloid. PFTs showed restrictive disease. CT chest showed no ILD but did show right hydronephrosis. Renal US showed single kidney with mild to moderate hydronephrosis on right (unchanged since 2011).  DC weight 208 lbs.    In 12/20, she progressed to ESRD and was started on HD.  On Tuesday Thursday Saturday schedule.  Continues to take low dose torsemide as recommended by nephrology. Still making urine but "very little". No longer on metolazone.   She presents to clinic today for routine follow-up.  She is here with her daughter.  She complains of increased exertional dyspnea that improves with rest.  She denies any associated chest pain. No palpitations. Has been attending all HD sessions.  They have been going well so far.  She denies any limitations with hypotension during her dialysis sessions.  She does have a history of COPD but has not seen a pulmonologist in recent years.  She denies any associated wheezing with her dyspnea.  She is euvolemic on physical exam and by ReDs Clip, 26%. She is in NSR by EKG.   Labs (2/18): K 4, creatinine 1.79 Labs (10/19): K 4.5, creatinine 2.4 Labs (12/19): LDL 62 Labs (1/20): K 3.9, creatinine 2.72 Labs (12/20): hgb 11.9,  SCr 2.58, K 4.0   PMH: 1. Atrial fibrillation: Paroxysmal.  2. HTN 3. 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  5. ESRD 6. H/o IVCD 7. COPD: PFTs (10/19) actually showed severe restriction, concern for interstitial process.  CT chest (10/19) showed mild-moderate patchy air trapping but no evidence for interstitial lung disease.  8. Gout 9. Chronic primarily diastolic CHF: Echo (4/28) with EF 50-55%, mild LVH, grade 2 diastolic dysfunction.  - LHC/RHC (10/15): small distal LAD but no discrete stenosis.  Mean RA 15, PA 55/20 mean 34, mean PCWP 31 => pulmonary venous hypertension.  - PYP scan (10/19): No evidence for TTR amyloidosis.  - Echo (10/19): EF 45-50%, mild LVH, normal RV size and systolic function.  - RHC (10/19): mean RA 13, PA 74/27 mean 50, mean PCWP 28, CI 3.12, PVR 3.6 WU. Pulmonary venous hypertension.  10. Sleep study 2016: Negative per patient.   Review of systems complete and found to be negative unless listed in HPI.    Social History   Socioeconomic History  . Marital status: Widowed    Spouse name: Not on file  . Number of children: 2  . Years of education: Not on file  . Highest education level: Not on file  Occupational History  . Occupation: retired  Tobacco Use  . Smoking status: Former Smoker    Packs/day: 1.00    Years: 20.00    Pack years: 20.00    Types: Cigarettes    Quit date: 02/22/2009    Years since quitting:  10.2  . Smokeless tobacco: Never Used  Substance and Sexual Activity  . Alcohol use: Yes    Alcohol/week: 0.0 standard drinks    Comment: occasional beer  . Drug use: Not Currently    Comment: marijuana in the past  . Sexual activity: Never  Other Topics Concern  . Not on file  Social History Narrative  . Not on file   Social Determinants of Health   Financial Resource Strain:   . Difficulty of Paying Living Expenses:   Food Insecurity:   . Worried About Charity fundraiser in the Last Year:   . Academic librarian in the Last Year:   Transportation Needs:   . Film/video editor (Medical):   Marland Kitchen Lack of Transportation (Non-Medical):   Physical Activity:   . Days of Exercise per Week:   . Minutes of Exercise per Session:   Stress:   . Feeling of Stress :   Social Connections:   . Frequency of Communication with Friends and Family:   . Frequency of Social Gatherings with Friends and Family:   . Attends Religious Services:   . Active Member of Clubs or Organizations:   . Attends Archivist Meetings:   Marland Kitchen Marital Status:   Intimate Partner Violence:   . Fear of Current or Ex-Partner:   . Emotionally Abused:   Marland Kitchen Physically Abused:   . Sexually Abused:    Family History  Problem Relation Age of Onset  . Lupus Daughter   . Cancer Mother 71  . CVA Father 4  . Cancer Brother 45   Review of systems complete and found to be negative unless listed in HPI.    Current Outpatient Medications  Medication Sig Dispense Refill  . albuterol (PROVENTIL HFA;VENTOLIN HFA) 108 (90 BASE) MCG/ACT inhaler Inhale 2 puffs into the lungs every 4 (four) hours as needed for shortness of breath.     Marland Kitchen apixaban (ELIQUIS) 5 MG TABS tablet Take 1 tablet (5 mg total) by mouth 2 (two) times daily. 60 tablet 3  . atorvastatin (LIPITOR) 80 MG tablet Take 80 mg by mouth every evening.     . B Complex-C-Zn-Folic Acid (DIALYVITE 960-AVWU 15) 0.8 MG TABS Take by mouth.    . Continuous Blood Gluc Receiver (FREESTYLE LIBRE READER) DEVI 1 kit by Does not apply route daily. Use as instructed E11.65 1 each 0  . Continuous Blood Gluc Sensor (FREESTYLE LIBRE 14 DAY SENSOR) MISC 1 every 14 days E11.65 6 each 0  . ethyl chloride spray SMARTSIG:1 Spray(s) Topical 3 Times a Week    . fluticasone (CUTIVATE) 0.005 % ointment Apply 1 application topically 2 (two) times daily as needed (skin irritation/rash).   2  . guaiFENesin (MUCINEX) 600 MG 12 hr tablet Take 600 mg by mouth 2 (two) times daily as needed for cough.     .  hydrALAZINE (APRESOLINE) 100 MG tablet Take 50 mg by mouth 3 (three) times daily.    . insulin aspart (NOVOLOG FLEXPEN) 100 UNIT/ML FlexPen Inject 8 Units into the skin 2 (two) times daily with breakfast and lunch AND 10 Units daily with supper. 15 mL 6  . insulin degludec (TRESIBA FLEXTOUCH) 100 UNIT/ML SOPN FlexTouch Pen Inject 0.2 mLs (20 Units total) into the skin daily. 15 mL 6  . isosorbide mononitrate (IMDUR) 30 MG 24 hr tablet Take 1 tablet by mouth once daily 90 tablet 3  . latanoprost (XALATAN) 0.005 % ophthalmic solution Place 1 drop into  both eyes at bedtime.    . metoprolol succinate (TOPROL-XL) 25 MG 24 hr tablet Take 2 tablets (50 mg total) by mouth every morning AND 1 tablet (25 mg total) every evening. 90 tablet 6  . NARCAN 4 MG/0.1ML LIQD nasal spray kit Place 1 spray into the nose See admin instructions. Call EMS at first sign of opioid overdose. Place on spray in nose.  If no response, then place one spray ing the opposite side of the nose.    . ondansetron (ZOFRAN) 4 MG tablet Take 4 mg by mouth 2 (two) times daily as needed for nausea.     . pantoprazole (PROTONIX) 40 MG tablet Take 40 mg by mouth daily.     Marland Kitchen torsemide (DEMADEX) 20 MG tablet Take 40 mg by mouth daily.    . traMADol (ULTRAM) 50 MG tablet Take 50 mg by mouth every 6 (six) hours as needed for severe pain (for pain.).     Marland Kitchen triamcinolone cream (KENALOG) 0.1 % Apply 1 application topically 2 (two) times daily as needed (skin rash/irritation.).      No current facility-administered medications for this encounter.   Today's Vitals   06/04/19 1106  BP: 120/64  Pulse: 80  SpO2: 98%  Weight: 87.7 kg (193 lb 6.4 oz)   Body mass index is 36.54 kg/m. Wt Readings from Last 3 Encounters:  06/04/19 87.7 kg (193 lb 6.4 oz)  04/04/19 89.3 kg (196 lb 12.8 oz)  02/06/19 92.4 kg (203 lb 12.8 oz)   PHYSICAL EXAM: ReDs Clip 26% General:  Well appearing. No respiratory difficulty HEENT: normal Neck: supple. no JVD.  Carotids 2+ bilat; no bruits. No lymphadenopathy or thyromegaly appreciated. Cor: PMI nondisplaced. Regular rate & rhythm. No rubs, gallops or murmurs. Lungs: clear Abdomen: soft, nontender, nondistended. No hepatosplenomegaly. No bruits or masses. Good bowel sounds. Extremities: no cyanosis, clubbing, rash, edema Neuro: alert & oriented x 3, cranial nerves grossly intact. moves all 4 extremities w/o difficulty. Affect pleasant.   Assessment/Plan: 1. Chronic primarily diastolic CHF: Echo in 5/27 with EF 50-55%, mild LVH, grade 2 diastolic dysfunction. 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 Cardiac output preserved. Echo (10/19) with EF 45-50%, septal-lateral dyssynchrony, RV looked ok.  PYP scan was not suggestive of transthyretin amyloidosis. Echo 1/21, LVEF 55-60%. RV normal. No valvular abnormalities.  - NYHA class III symptoms. Pt feels her exertional dyspnea has worsened but no resting symptoms.  Volume now controlled by HD.  Remains on low dose torsemide, 20 mg daily (dose recently reduced), as advised to nephrology. Still making some urine. Euvolemic on exam and by ReDs clip measurement, 26% - Given worsening dyspnea, will review w/ Dr. Aundra Dubin to see if he would recommend repeat RHC to reassess her pulmonary HTN 2. COPD: She no longer smokes. Restrictive PFTs. High rest CT negative for ILD.  - given worsening dyspnea, I recommended that she f/u with pulmonology for further assessment  3. HTN: BP controlled on current regimen   4. ESRD: on HD T,TH,SAT  5. Atrial fibrillation: Paroxysmal.  She is in NSR today by EKG - Continue apixaban 5 mg bid. Check CBC today to r/o anemia given exertional dyspnea 6. Pulmonary HTN: Moderate to severe pulmonary hypertension on 10/19 RHC. PVR 3.6 WU. This  appeared to be primarily pulmonary venous hypertension.  - previously felt to not need treatment with  selective pulmonary vasodilators - given worsening dyspnea, will consider repeat RHC. Will discuss w/ MD.  7. Right hydronephrosis: Renal US showed single kidney with mild to moderate hydronephrosis on right (unchanged since 2011).  8. Exertional Dyspnea: pt endorses worsening symptoms. Volume status is ok and well controlled through HD. ReDs clip 26%. In NSR on ekg - Check CBC today to r/o anemia given chronic eliquis  - advised that she f/u w/ pulmonology - may need repeat RHC + LHC to reassess her pulmonary hypertension and to r/o CAD. Will defer to Dr. Aundra Dubin. In the event that he may recommend [facility], I have reviewed the risks, indications, and alternatives to cardiac catheterization and possible angioplasty/stenting with the patient. Risks include but are not limited to bleeding, infection, vascular injury, stroke, myocardial infection, arrhythmia, kidney injury, radiation-related injury in the case of prolonged fluoroscopy use, emergency cardiac surgery, and death. The patient understands the risks of serious complication is low (<8%). She would wish to proceed if recommended by MD.    Will f/u w/ MD recommendations regarding scheduling potential R/LHC. Otherwise, F/u w/ Aundra Dubin in 4-6 months.    Lyda Jester, PA-C  06/04/2019

## 2019-06-04 NOTE — Patient Instructions (Signed)
It was great to see you today! No medication changes are needed at this time.  Labs today We will only contact you if something comes back abnormal or we need to make some changes. Otherwise no news is good news!   We will be in contact with you to arrange a cardiac catheterization. Cardiac catheterization is used to diagnose and/or treat various heart conditions. Doctors may recommend this procedure for a number of different reasons. The most common reason is to evaluate chest pain. Chest pain can be a symptom of coronary artery disease (CAD), and cardiac catheterization can show whether plaque is narrowing or blocking your heart's arteries. This procedure is also used to evaluate the valves, as well as measure the blood flow and oxygen levels in different parts of your heart. For further information please visit [email or web address]. Please follow instruction sheet, as given.    Your physician recommends that you schedule a follow-up appointment in: 3-4 months with Dr Aundra Dubin  Do the following things EVERYDAY: 1) Weigh yourself in the morning before breakfast. Write it down and keep it in a log. 2) Take your medicines as prescribed 3) Eat low salt foods--Limit salt (sodium) to 2000 mg per day.  4) Stay as active as you can everyday 5) Limit all fluids for the day to less than 2 liters  At the [facility], you and your health needs are our priority. As part of our continuing mission to provide you with exceptional heart care, we have created designated Provider Care Teams. These Care Teams include your primary Cardiologist (physician) and Advanced Practice Providers (APPs- Physician Assistants and Nurse Practitioners) who all work together to provide you with the care you need, when you need it.   You may see any of the following providers on your designated Care Team at your next follow up: Marland Kitchen Dr Glori Bickers . Dr Loralie Champagne . Darrick Grinder, NP . Lyda Jester,  PA . Audry Riles, PharmD   Please be sure to bring in all your medications bottles to every appointment.

## 2019-06-04 NOTE — Patient Outreach (Signed)
  [facility]) Care Management Chronic Special Needs Program    06/04/2019  Name: Yenty Bloch, DOB: [birth year]  MRN: [id number]   Ms. Kandie Keiper is enrolled in a chronic special needs plan for diabetes. Telephone call to client for assessment follow up. Unable to reach. HIPAA compliant voice message left with call back phone number.   PLAN; RNCM will attempt 2nd telephone call to client within 1 week.    Quinn Plowman RN,BSN,CCM [facility] Management [phone]

## 2019-06-04 NOTE — Progress Notes (Signed)
Advanced Heart Failure Clinic Note  PCP: Alroy Dust, L.Marlou Sa, MD HF Cardiology: Dr. Aundra Dubin  79 y.o.with history of ESRD, paroxysmal atrial fibrillation, and chronic diastolic CHF was referred by Dr. Wynonia Lawman for evaluation of CHF.  RHC/LHC was done in 10/15.  This showed markedly elevated filling pressures and pulmonary venous hypertension.  There was no significant CAD.  Last echo in 2/18 showed EF 50-55%, mild LVH, grade 2 diastolic dysfunction. She is followed by Dr. Moshe Cipro for nephrology.   Admitted 10/2-10/6/19 with SOB. RHC showed elevated filling pressures and preserved CO. She was diuresed with IV lasix, then transitioned to torsemide 80 mg am, 40 mg pm. Repeat echo showed EF 45-50% with normal RV. PYP scan was negative for TTR amyloid. PFTs showed restrictive disease. CT chest showed no ILD but did show right hydronephrosis. Renal US showed single kidney with mild to moderate hydronephrosis on right (unchanged since 2011).  DC weight 208 lbs.    In 12/20, she progressed to ESRD and was started on HD.  On Tuesday Thursday Saturday schedule.  Continues to take low dose torsemide as recommended by nephrology. Still making urine but "very little". No longer on metolazone.   She presents to clinic today for routine follow-up.  She is here with her daughter.  She complains of increased exertional dyspnea that improves with rest.  She denies any associated chest pain. No palpitations. Has been attending all HD sessions.  They have been going well so far.  She denies any limitations with hypotension during her dialysis sessions.  She does have a history of COPD but has not seen a pulmonologist in recent years.  She denies any associated wheezing with her dyspnea.  She is euvolemic on physical exam and by ReDs Clip, 26%. She is in NSR by EKG.   Labs (2/18): K 4, creatinine 1.79 Labs (10/19): K 4.5, creatinine 2.4 Labs (12/19): LDL 62 Labs (1/20): K 3.9, creatinine 2.72 Labs (12/20): hgb 11.9,  SCr 2.58, K 4.0   PMH: 1. Atrial fibrillation: Paroxysmal.  2. HTN 3. 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  5. ESRD 6. H/o IVCD 7. COPD: PFTs (10/19) actually showed severe restriction, concern for interstitial process.  CT chest (10/19) showed mild-moderate patchy air trapping but no evidence for interstitial lung disease.  8. Gout 9. Chronic primarily diastolic CHF: Echo (3/26) with EF 50-55%, mild LVH, grade 2 diastolic dysfunction.  - LHC/RHC (10/15): small distal LAD but no discrete stenosis.  Mean RA 15, PA 55/20 mean 34, mean PCWP 31 => pulmonary venous hypertension.  - PYP scan (10/19): No evidence for TTR amyloidosis.  - Echo (10/19): EF 45-50%, mild LVH, normal RV size and systolic function.  - RHC (10/19): mean RA 13, PA 74/27 mean 50, mean PCWP 28, CI 3.12, PVR 3.6 WU. Pulmonary venous hypertension.  10. Sleep study 2016: Negative per patient.   Review of systems complete and found to be negative unless listed in HPI.    Social History   Socioeconomic History  . Marital status: Widowed    Spouse name: Not on file  . Number of children: 2  . Years of education: Not on file  . Highest education level: Not on file  Occupational History  . Occupation: retired  Tobacco Use  . Smoking status: Former Smoker    Packs/day: 1.00    Years: 20.00    Pack years: 20.00    Types: Cigarettes    Quit date: 02/22/2009    Years since quitting:  10.2  . Smokeless tobacco: Never Used  Substance and Sexual Activity  . Alcohol use: Yes    Alcohol/week: 0.0 standard drinks    Comment: occasional beer  . Drug use: Not Currently    Comment: marijuana in the past  . Sexual activity: Never  Other Topics Concern  . Not on file  Social History Narrative  . Not on file   Social Determinants of Health   Financial Resource Strain:   . Difficulty of Paying Living Expenses:   Food Insecurity:   . Worried About Charity fundraiser in the Last Year:   . Academic librarian in the Last Year:   Transportation Needs:   . Film/video editor (Medical):   Marland Kitchen Lack of Transportation (Non-Medical):   Physical Activity:   . Days of Exercise per Week:   . Minutes of Exercise per Session:   Stress:   . Feeling of Stress :   Social Connections:   . Frequency of Communication with Friends and Family:   . Frequency of Social Gatherings with Friends and Family:   . Attends Religious Services:   . Active Member of Clubs or Organizations:   . Attends Archivist Meetings:   Marland Kitchen Marital Status:   Intimate Partner Violence:   . Fear of Current or Ex-Partner:   . Emotionally Abused:   Marland Kitchen Physically Abused:   . Sexually Abused:    Family History  Problem Relation Age of Onset  . Lupus Daughter   . Cancer Mother 21  . CVA Father 26  . Cancer Brother 45   Review of systems complete and found to be negative unless listed in HPI.    Current Outpatient Medications  Medication Sig Dispense Refill  . albuterol (PROVENTIL HFA;VENTOLIN HFA) 108 (90 BASE) MCG/ACT inhaler Inhale 2 puffs into the lungs every 4 (four) hours as needed for shortness of breath.     Marland Kitchen apixaban (ELIQUIS) 5 MG TABS tablet Take 1 tablet (5 mg total) by mouth 2 (two) times daily. 60 tablet 3  . atorvastatin (LIPITOR) 80 MG tablet Take 80 mg by mouth every evening.     . B Complex-C-Zn-Folic Acid (DIALYVITE 431-VQMG 15) 0.8 MG TABS Take by mouth.    . Continuous Blood Gluc Receiver (FREESTYLE LIBRE READER) DEVI 1 kit by Does not apply route daily. Use as instructed E11.65 1 each 0  . Continuous Blood Gluc Sensor (FREESTYLE LIBRE 14 DAY SENSOR) MISC 1 every 14 days E11.65 6 each 0  . ethyl chloride spray SMARTSIG:1 Spray(s) Topical 3 Times a Week    . fluticasone (CUTIVATE) 0.005 % ointment Apply 1 application topically 2 (two) times daily as needed (skin irritation/rash).   2  . guaiFENesin (MUCINEX) 600 MG 12 hr tablet Take 600 mg by mouth 2 (two) times daily as needed for cough.     .  hydrALAZINE (APRESOLINE) 100 MG tablet Take 50 mg by mouth 3 (three) times daily.    . insulin aspart (NOVOLOG FLEXPEN) 100 UNIT/ML FlexPen Inject 8 Units into the skin 2 (two) times daily with breakfast and lunch AND 10 Units daily with supper. 15 mL 6  . insulin degludec (TRESIBA FLEXTOUCH) 100 UNIT/ML SOPN FlexTouch Pen Inject 0.2 mLs (20 Units total) into the skin daily. 15 mL 6  . isosorbide mononitrate (IMDUR) 30 MG 24 hr tablet Take 1 tablet by mouth once daily 90 tablet 3  . latanoprost (XALATAN) 0.005 % ophthalmic solution Place 1 drop into  both eyes at bedtime.    . metoprolol succinate (TOPROL-XL) 25 MG 24 hr tablet Take 2 tablets (50 mg total) by mouth every morning AND 1 tablet (25 mg total) every evening. 90 tablet 6  . NARCAN 4 MG/0.1ML LIQD nasal spray kit Place 1 spray into the nose See admin instructions. Call EMS at first sign of opioid overdose. Place on spray in nose.  If no response, then place one spray ing the opposite side of the nose.    . ondansetron (ZOFRAN) 4 MG tablet Take 4 mg by mouth 2 (two) times daily as needed for nausea.     . pantoprazole (PROTONIX) 40 MG tablet Take 40 mg by mouth daily.     Marland Kitchen torsemide (DEMADEX) 20 MG tablet Take 40 mg by mouth daily.    . traMADol (ULTRAM) 50 MG tablet Take 50 mg by mouth every 6 (six) hours as needed for severe pain (for pain.).     Marland Kitchen triamcinolone cream (KENALOG) 0.1 % Apply 1 application topically 2 (two) times daily as needed (skin rash/irritation.).      No current facility-administered medications for this encounter.   Today's Vitals   06/04/19 1106  BP: 120/64  Pulse: 80  SpO2: 98%  Weight: 87.7 kg (193 lb 6.4 oz)   Body mass index is 36.54 kg/m. Wt Readings from Last 3 Encounters:  06/04/19 87.7 kg (193 lb 6.4 oz)  04/04/19 89.3 kg (196 lb 12.8 oz)  02/06/19 92.4 kg (203 lb 12.8 oz)   PHYSICAL EXAM: ReDs Clip 26% General:  Well appearing. No respiratory difficulty HEENT: normal Neck: supple. no JVD.  Carotids 2+ bilat; no bruits. No lymphadenopathy or thyromegaly appreciated. Cor: PMI nondisplaced. Regular rate & rhythm. No rubs, gallops or murmurs. Lungs: clear Abdomen: soft, nontender, nondistended. No hepatosplenomegaly. No bruits or masses. Good bowel sounds. Extremities: no cyanosis, clubbing, rash, edema Neuro: alert & oriented x 3, cranial nerves grossly intact. moves all 4 extremities w/o difficulty. Affect pleasant.   Assessment/Plan: 1. Chronic primarily diastolic CHF: Echo in 3/01 with EF 50-55%, mild LVH, grade 2 diastolic dysfunction. 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 Cardiac output preserved. Echo (10/19) with EF 45-50%, septal-lateral dyssynchrony, RV looked ok.  PYP scan was not suggestive of transthyretin amyloidosis. Echo 1/21, LVEF 55-60%. RV normal. No valvular abnormalities.  - NYHA class III symptoms. Pt feels her exertional dyspnea has worsened but no resting symptoms.  Volume now controlled by HD.  Remains on low dose torsemide, 20 mg daily (dose recently reduced), as advised to nephrology. Still making some urine. Euvolemic on exam and by ReDs clip measurement, 26% - Given worsening dyspnea, will review w/ Dr. Aundra Dubin to see if he would recommend repeat RHC to reassess her pulmonary HTN 2. COPD: She no longer smokes. Restrictive PFTs. High rest CT negative for ILD.  - given worsening dyspnea, I recommended that she f/u with pulmonology for further assessment  3. HTN: BP controlled on current regimen   4. ESRD: on HD T,TH,SAT  5. Atrial fibrillation: Paroxysmal.  She is in NSR today by EKG - Continue apixaban 5 mg bid. Check CBC today to r/o anemia given exertional dyspnea 6. Pulmonary HTN: Moderate to severe pulmonary hypertension on 10/19 RHC. PVR 3.6 WU. This  appeared to be primarily pulmonary venous hypertension.  - previously felt to not need treatment with  selective pulmonary vasodilators - given worsening dyspnea, will consider repeat RHC. Will discuss w/ MD.  7. Right hydronephrosis: Renal US showed single kidney with mild to moderate hydronephrosis on right (unchanged since 2011).  8. Exertional Dyspnea: pt endorses worsening symptoms. Volume status is ok and well controlled through HD. ReDs clip 26%. In NSR on ekg - Check CBC today to r/o anemia given chronic eliquis  - advised that she f/u w/ pulmonology - may need repeat RHC + LHC to reassess her pulmonary hypertension and to r/o CAD. Will defer to Dr. Aundra Dubin. In the event that he may recommend [facility], I have reviewed the risks, indications, and alternatives to cardiac catheterization and possible angioplasty/stenting with the patient. Risks include but are not limited to bleeding, infection, vascular injury, stroke, myocardial infection, arrhythmia, kidney injury, radiation-related injury in the case of prolonged fluoroscopy use, emergency cardiac surgery, and death. The patient understands the risks of serious complication is low (<8%). She would wish to proceed if recommended by MD.    Will f/u w/ MD recommendations regarding scheduling potential R/LHC. Otherwise, F/u w/ Aundra Dubin in 4-6 months.    Lyda Jester, PA-C  06/04/2019

## 2019-06-04 NOTE — Progress Notes (Signed)
ReDS Vest / Clip - 06/04/19 1100      ReDS Vest / Clip   Station Marker  C    Ruler Value  36    ReDS Value Range  Low volume    ReDS Actual Value  34    Anatomical Comments  --   sitting

## 2019-06-07 ENCOUNTER — Telehealth (HOSPITAL_COMMUNITY): Payer: Self-pay | Admitting: Cardiology

## 2019-06-07 ENCOUNTER — Other Ambulatory Visit: Payer: Self-pay

## 2019-06-07 NOTE — Telephone Encounter (Signed)
-----   Message from Consuelo Pandy, [place] sent at 06/05/2019  4:28 PM EDT ----- Regarding: Schedule RHC Please call pt to notify her of Dr. Claris Gladden recommendations. Please arrange for RHC. Thanks!  ----- Message ----- From: Larey Dresser, MD Sent: 06/04/2019  10:48 PM EDT To: Brittainy Erie Noe, PA-C  Repeat RHC would be reasonable.  In absence of chest pain and with elevated creatinine, probably no angiography.  ----- Message ----- From: Consuelo Pandy, PA-C Sent: 06/04/2019   9:00 PM EDT To: Larey Dresser, MD, Valeda Malm, RN  I saw this pt in clinic today and she endorsed worsening dyspnea. Volume status looked ok but h/o pulmonary HTN. Would you recommend repeat RHC at this point w/ LHC as well? Please advise. Thanks!

## 2019-06-07 NOTE — Patient Outreach (Signed)
  [facility]) Care Management Chronic Special Needs Program    06/07/2019  Name: Stephanie Woodward, DOB: [birth year]  MRN: [id number]   Stephanie Woodward is enrolled in a chronic special needs plan for Diabetes.Telephone call to client for assessment follow up. HIPAA verified. Client states she would not be able to talk at this time because she is on her way to dialysis. Client confirmed her doctor adjusted her Tyler Aas and Novolog medications due to recent hypoglycemic episodes. Medication dosage verified with client.   PLAN: RNCM will follow up with client within 1 week.   Quinn Plowman RN,BSN,CCM [facility] [phone]

## 2019-06-07 NOTE — Telephone Encounter (Signed)
    [facility] [place] [id number] [place] [address] Dept: [phone] Loc: [phone]  Destin Kittler  06/07/2019  You are scheduled for a Cardiac Catheterization on Thursday, April 22 with Dr. Loralie Champagne.  1. Please arrive at the [facility] (Main Entrance A) at [facility]: [address] [place], [place] [address] at 9:00 AM (This time is two hours before your procedure to ensure your preparation). Free valet parking service is available.   Special note: Every effort is made to have your procedure done on time. Please understand that emergencies sometimes delay scheduled procedures.  2. Diet: Do not eat solid foods after midnight.  The patient may have clear liquids until 5am upon the day of the procedure.  3. Labs: pre procedure labs done 06/04/2019  You will need a pre procedure COVID test    WHEN: 06/11/2019  anytime between 9am-3pm WHERE: [facility]  [place] [id number]  This is a drive thru testing site, you will remain in your car. Be sure to get in the line FOR PROCEDURES Once you have been swabbed you will need to remain home in quarantine until you return for your procedure.   4. Medication instructions in preparation for your procedure:   Contrast Allergy: No    Stop taking Eliquis (Apixiban) on Tuesday, April 19.  Stop taking, Torsemide (Demadex) Thursday, April 22,  Take only 10 units of insulin the night before your procedure. Do not take any insulin on the day of the procedure.    On the morning of your procedure, take your Aspirin and any morning medicines NOT listed above.  You may use sips of water.  5. Plan for one night stay--bring personal belongings. 6. Bring a current list of your medications and current insurance cards. 7. You MUST have a responsible person to drive you home. 8. Someone MUST be with you the  first 24 hours after you arrive home or your discharge will be delayed. 9. Please wear clothes that are easy to get on and off and wear slip-on shoes.  Thank you for allowing [place] to care for you!   -- [facility]

## 2019-06-08 ENCOUNTER — Encounter (HOSPITAL_COMMUNITY): Payer: Self-pay

## 2019-06-08 NOTE — Telephone Encounter (Signed)
Patient returned call and she is unable to have procedure on Tues/Thurs/Sat as she has dialysis Rescheduled to accommodate dialysis. Instructions below given to patient and voiced understanding       Stephanie Woodward  06/08/2019  You are scheduled for a Cardiac Catheterization on Friday, April 23 with Dr. Loralie Champagne.  1. Please arrive at the [facility] (Main Entrance A) at [facility]: [address] [place], [place] [address] at 7:00 AM (This time is two hours before your procedure to ensure your preparation). Free valet parking service is available.   Special note: Every effort is made to have your procedure done on time. Please understand that emergencies sometimes delay scheduled procedures.  2. Diet: Do not eat solid foods after midnight.  The patient may have clear liquids until 5am upon the day of the procedure.  3. Labs: pre procedure labs done 06/06/2019  4. Medication instructions in preparation for your procedure:   Contrast Allergy: No   Stop taking Eliquis (Apixiban) on Wednesday, April 21.  Stop taking, Torsemide (Demadex) Friday, April 23,  Take only 5 units of insulin the night before your procedure. Do not take any insulin on the day of the procedure.    On the morning of your procedure, take your Aspirin and any morning medicines NOT listed above.  You may use sips of water.  5. Plan for one night stay--bring personal belongings. 6. Bring a current list of your medications and current insurance cards. 7. You MUST have a responsible person to drive you home. 8. Someone MUST be with you the first 24 hours after you arrive home or your discharge will be delayed. 9. Please wear clothes that are easy to get on and off and wear slip-on shoes.  Thank you for allowing [place] to care for you!   -- [place] [facility]

## 2019-06-09 ENCOUNTER — Other Ambulatory Visit (HOSPITAL_COMMUNITY): Payer: Self-pay | Admitting: Cardiology

## 2019-06-11 ENCOUNTER — Other Ambulatory Visit: Payer: Self-pay

## 2019-06-11 ENCOUNTER — Telehealth: Payer: Self-pay | Admitting: Internal Medicine

## 2019-06-11 MED ORDER — GLUCOSE BLOOD VI STRP: ORAL_STRIP | 12 refills | Status: DC

## 2019-06-11 MED ORDER — APIXABAN 5 MG PO TABS: 5.0000 mg | ORAL_TABLET | Freq: Two times a day (BID) | ORAL | 3 refills | Status: DC

## 2019-06-11 NOTE — Addendum Note (Signed)
Addended by: Shela Nevin R on: 2/90/9030 09:48 AM   Modules accepted: Orders

## 2019-06-11 NOTE — Telephone Encounter (Signed)
Spoke to pt, she has been having what she called blood clots in her freestyle libre sensors. Pt stated that she has started using her glucometer today because she placed the sensor in the wrong place on her arm.( just read instructions on placement today). Readings as follows: 371@3 :38 am 330@3 :53 am 198@7 :42 am 169@10 :30 am 168@ 10:42 am pt is on novolog 8 units for breakfast and lunch and 10 units with supper. Pt has not taken either dose for today because she did not eat until 11 am and she stated that she was scared to take her insulin because of the blood clots in the sensor. Pt also supposed to on tresiba 20 units. Please advise.

## 2019-06-11 NOTE — Telephone Encounter (Signed)
Attempted to call pt and get recent blood sugar readings, phone rang 4 times and then nothing, no vm or anything.

## 2019-06-11 NOTE — Telephone Encounter (Signed)
Patient calling requesting nurse to give her a call back regarding high blood sugar. Please advise ph# [phone]

## 2019-06-11 NOTE — Telephone Encounter (Signed)
Pt informed and stated that she understood.

## 2019-06-12 ENCOUNTER — Other Ambulatory Visit (HOSPITAL_COMMUNITY)
Admission: RE | Admit: 2019-06-12 | Discharge: 2019-06-12 | Disposition: A | Discharge status: Home / Self Care | Payer: HMO | Source: Ambulatory Visit | Attending: Cardiology | Admitting: Cardiology

## 2019-06-12 DIAGNOSIS — Z01812 Encounter for preprocedural laboratory examination: Secondary | ICD-10-CM | POA: Insufficient documentation

## 2019-06-12 DIAGNOSIS — Z20822 Contact with and (suspected) exposure to covid-19: Secondary | ICD-10-CM | POA: Insufficient documentation

## 2019-06-12 LAB — SARS CORONAVIRUS 2 (TAT 6-24 HRS): SARS Coronavirus 2: NEGATIVE

## 2019-06-13 ENCOUNTER — Ambulatory Visit: Payer: Self-pay

## 2019-06-15 ENCOUNTER — Encounter (HOSPITAL_COMMUNITY)
Admission: RE | Disposition: A | Discharge status: Home / Self Care | Payer: Self-pay | Source: Home / Self Care | Attending: Cardiology

## 2019-06-15 ENCOUNTER — Other Ambulatory Visit: Payer: Self-pay

## 2019-06-15 ENCOUNTER — Ambulatory Visit (HOSPITAL_COMMUNITY)
Admission: RE | Admit: 2019-06-15 | Discharge: 2019-06-15 | Disposition: A | Discharge status: Home / Self Care | Payer: HMO | Attending: Cardiology | Admitting: Cardiology

## 2019-06-15 DIAGNOSIS — I480 Paroxysmal atrial fibrillation: Secondary | ICD-10-CM | POA: Diagnosis not present

## 2019-06-15 DIAGNOSIS — Z7901 Long term (current) use of anticoagulants: Secondary | ICD-10-CM | POA: Diagnosis not present

## 2019-06-15 DIAGNOSIS — I5032 Chronic diastolic (congestive) heart failure: Secondary | ICD-10-CM | POA: Insufficient documentation

## 2019-06-15 DIAGNOSIS — J449 Chronic obstructive pulmonary disease, unspecified: Secondary | ICD-10-CM | POA: Insufficient documentation

## 2019-06-15 DIAGNOSIS — N186 End stage renal disease: Secondary | ICD-10-CM | POA: Insufficient documentation

## 2019-06-15 DIAGNOSIS — E785 Hyperlipidemia, unspecified: Secondary | ICD-10-CM | POA: Insufficient documentation

## 2019-06-15 DIAGNOSIS — I272 Pulmonary hypertension, unspecified: Secondary | ICD-10-CM | POA: Diagnosis not present

## 2019-06-15 DIAGNOSIS — E1122 Type 2 diabetes mellitus with diabetic chronic kidney disease: Secondary | ICD-10-CM | POA: Diagnosis not present

## 2019-06-15 DIAGNOSIS — Z992 Dependence on renal dialysis: Secondary | ICD-10-CM | POA: Insufficient documentation

## 2019-06-15 DIAGNOSIS — Z87891 Personal history of nicotine dependence: Secondary | ICD-10-CM | POA: Diagnosis not present

## 2019-06-15 DIAGNOSIS — I2510 Atherosclerotic heart disease of native coronary artery without angina pectoris: Secondary | ICD-10-CM | POA: Diagnosis not present

## 2019-06-15 DIAGNOSIS — N1330 Unspecified hydronephrosis: Secondary | ICD-10-CM | POA: Insufficient documentation

## 2019-06-15 DIAGNOSIS — M109 Gout, unspecified: Secondary | ICD-10-CM | POA: Insufficient documentation

## 2019-06-15 DIAGNOSIS — Z79899 Other long term (current) drug therapy: Secondary | ICD-10-CM | POA: Insufficient documentation

## 2019-06-15 DIAGNOSIS — I132 Hypertensive heart and chronic kidney disease with heart failure and with stage 5 chronic kidney disease, or end stage renal disease: Secondary | ICD-10-CM | POA: Diagnosis not present

## 2019-06-15 DIAGNOSIS — R0600 Dyspnea, unspecified: Secondary | ICD-10-CM | POA: Diagnosis not present

## 2019-06-15 DIAGNOSIS — Z794 Long term (current) use of insulin: Secondary | ICD-10-CM | POA: Diagnosis not present

## 2019-06-15 HISTORY — PX: RIGHT/LEFT HEART CATH AND CORONARY ANGIOGRAPHY: CATH118266

## 2019-06-15 LAB — BASIC METABOLIC PANEL
Anion gap: 12 (ref 5–15)
BUN: 24 mg/dL — ABNORMAL HIGH (ref 8–23)
CO2: 32 mmol/L (ref 22–32)
Calcium: 9 mg/dL (ref 8.9–10.3)
Chloride: 93 mmol/L — ABNORMAL LOW (ref 98–111)
Creatinine, Ser: 4.2 mg/dL — ABNORMAL HIGH (ref 0.44–1.00)
GFR calc Af Amer: 11 mL/min — ABNORMAL LOW (ref >60)
GFR calc non Af Amer: 10 mL/min — ABNORMAL LOW (ref >60)
Glucose, Bld: 164 mg/dL — ABNORMAL HIGH (ref 70–99)
Potassium: 3.7 mmol/L (ref 3.5–5.1)
Sodium: 137 mmol/L (ref 135–145)

## 2019-06-15 LAB — POCT I-STAT EG7
Acid-Base Excess: 10 mmol/L — ABNORMAL HIGH (ref 0.0–2.0)
Acid-Base Excess: 9 mmol/L — ABNORMAL HIGH (ref 0.0–2.0)
Bicarbonate: 36.4 mmol/L — ABNORMAL HIGH (ref 20.0–28.0)
Bicarbonate: 36.6 mmol/L — ABNORMAL HIGH (ref 20.0–28.0)
Calcium, Ion: 1.14 mmol/L — ABNORMAL LOW (ref 1.15–1.40)
Calcium, Ion: 1.16 mmol/L (ref 1.15–1.40)
HCT: 35 % — ABNORMAL LOW (ref 36.0–46.0)
HCT: 36 % (ref 36.0–46.0)
Hemoglobin: 11.9 g/dL — ABNORMAL LOW (ref 12.0–15.0)
Hemoglobin: 12.2 g/dL (ref 12.0–15.0)
O2 Saturation: 77 %
O2 Saturation: 78 %
Potassium: 3.5 mmol/L (ref 3.5–5.1)
Potassium: 3.6 mmol/L (ref 3.5–5.1)
Sodium: 137 mmol/L (ref 135–145)
Sodium: 137 mmol/L (ref 135–145)
TCO2: 38 mmol/L — ABNORMAL HIGH (ref 22–32)
TCO2: 38 mmol/L — ABNORMAL HIGH (ref 22–32)
pCO2, Ven: 60.1 mmHg — ABNORMAL HIGH (ref 44.0–60.0)
pCO2, Ven: 60.8 mmHg — ABNORMAL HIGH (ref 44.0–60.0)
pH, Ven: 7.387 (ref 7.250–7.430)
pH, Ven: 7.391 (ref 7.250–7.430)
pO2, Ven: 43 mmHg (ref 32.0–45.0)
pO2, Ven: 45 mmHg (ref 32.0–45.0)

## 2019-06-15 LAB — CBC
HCT: 38.6 % (ref 36.0–46.0)
Hemoglobin: 12.2 g/dL (ref 12.0–15.0)
MCH: 29.8 pg (ref 26.0–34.0)
MCHC: 31.6 g/dL (ref 30.0–36.0)
MCV: 94.1 fL (ref 80.0–100.0)
Platelets: 268 10*3/uL (ref 150–400)
RBC: 4.1 MIL/uL (ref 3.87–5.11)
RDW: 13.7 % (ref 11.5–15.5)
WBC: 12 10*3/uL — ABNORMAL HIGH (ref 4.0–10.5)
nRBC: 0 % (ref 0.0–0.2)

## 2019-06-15 LAB — GLUCOSE, CAPILLARY
Glucose-Capillary: 152 mg/dL — ABNORMAL HIGH (ref 70–99)
Glucose-Capillary: 148 mg/dL — ABNORMAL HIGH (ref 70–99)

## 2019-06-15 SURGERY — RIGHT/LEFT HEART CATH AND CORONARY ANGIOGRAPHY
Anesthesia: LOCAL

## 2019-06-15 MED ORDER — LIDOCAINE HCL (PF) 1 % IJ SOLN
INTRAMUSCULAR | Status: DC | PRN
  Administered 2019-06-15: 15 mL

## 2019-06-15 MED ORDER — SODIUM CHLORIDE 0.9% FLUSH: 3.0000 mL | Freq: Two times a day (BID) | INTRAVENOUS | Status: DC

## 2019-06-15 MED ORDER — HEPARIN (PORCINE) IN NACL 1000-0.9 UT/500ML-% IV SOLN
INTRAVENOUS | Status: AC
  Filled 2019-06-15: qty 1000

## 2019-06-15 MED ORDER — ASPIRIN 81 MG PO CHEW
81.0000 mg | CHEWABLE_TABLET | ORAL | Status: AC
  Administered 2019-06-15: 81 mg via ORAL
  Filled 2019-06-15: qty 1

## 2019-06-15 MED ORDER — MIDAZOLAM HCL 2 MG/2ML IJ SOLN
INTRAMUSCULAR | Status: AC
  Filled 2019-06-15: qty 2

## 2019-06-15 MED ORDER — SODIUM CHLORIDE 0.9% FLUSH: 3.0000 mL | INTRAVENOUS | Status: DC | PRN

## 2019-06-15 MED ORDER — ACETAMINOPHEN 325 MG PO TABS
650.0000 mg | ORAL_TABLET | ORAL | Status: DC | PRN
  Administered 2019-06-15: 11:00:00 650 mg via ORAL
  Filled 2019-06-15: qty 2

## 2019-06-15 MED ORDER — SODIUM CHLORIDE 0.9 % IV SOLN: 250.0000 mL | INTRAVENOUS | Status: DC | PRN

## 2019-06-15 MED ORDER — IOHEXOL 350 MG/ML SOLN
INTRAVENOUS | Status: DC | PRN
  Administered 2019-06-15: 60 mL

## 2019-06-15 MED ORDER — MIDAZOLAM HCL 2 MG/2ML IJ SOLN
INTRAMUSCULAR | Status: DC | PRN
  Administered 2019-06-15 (×2): 1 mg via INTRAVENOUS

## 2019-06-15 MED ORDER — ONDANSETRON HCL 4 MG/2ML IJ SOLN: 4.0000 mg | Freq: Four times a day (QID) | INTRAMUSCULAR | Status: DC | PRN

## 2019-06-15 MED ORDER — FENTANYL CITRATE (PF) 100 MCG/2ML IJ SOLN
INTRAMUSCULAR | Status: AC
  Filled 2019-06-15: qty 2

## 2019-06-15 MED ORDER — LIDOCAINE HCL (PF) 1 % IJ SOLN
INTRAMUSCULAR | Status: AC
  Filled 2019-06-15: qty 30

## 2019-06-15 MED ORDER — HYDRALAZINE HCL 20 MG/ML IJ SOLN: 10.0000 mg | INTRAMUSCULAR | Status: DC | PRN

## 2019-06-15 MED ORDER — SODIUM CHLORIDE 0.9 % IV SOLN: INTRAVENOUS | Status: DC

## 2019-06-15 MED ORDER — FENTANYL CITRATE (PF) 100 MCG/2ML IJ SOLN
INTRAMUSCULAR | Status: DC | PRN
  Administered 2019-06-15 (×3): 25 ug via INTRAVENOUS

## 2019-06-15 MED ORDER — LABETALOL HCL 5 MG/ML IV SOLN: 10.0000 mg | INTRAVENOUS | Status: DC | PRN

## 2019-06-15 MED ORDER — HEPARIN (PORCINE) IN NACL 1000-0.9 UT/500ML-% IV SOLN
INTRAVENOUS | Status: DC | PRN
  Administered 2019-06-15 (×2): 500 mL

## 2019-06-15 SURGICAL SUPPLY — 12 items
CATH INFINITI 5FR MULTPACK ANG (CATHETERS) ×1 IMPLANT
CATH SWAN GANZ 7F STRAIGHT (CATHETERS) ×1 IMPLANT
CLOSURE MYNX CONTROL 5F (Vascular Products) ×1 IMPLANT
CLOSURE MYNX CONTROL 6F/7F (Vascular Products) ×1 IMPLANT
GUIDEWIRE ANGLED .035X150CM (WIRE) ×1 IMPLANT
KIT HEART LEFT (KITS) ×2 IMPLANT
PACK CARDIAC CATHETERIZATION (CUSTOM PROCEDURE TRAY) ×2 IMPLANT
SHEATH PINNACLE 5F 10CM (SHEATH) ×1 IMPLANT
SHEATH PINNACLE 7F 10CM (SHEATH) ×1 IMPLANT
SHEATH PROBE COVER 6X72 (BAG) ×1 IMPLANT
TRANSDUCER W/STOPCOCK (MISCELLANEOUS) ×2 IMPLANT
WIRE EMERALD 3MM-J .035X150CM (WIRE) ×2 IMPLANT

## 2019-06-15 NOTE — Interval H&P Note (Signed)
History and Physical Interval Note:  06/15/2019 9:18 AM  Stephanie Woodward  has presented today for surgery, with the diagnosis of Congestive Heart Failure.  The various methods of treatment have been discussed with the patient and family. After consideration of risks, benefits and other options for treatment, the patient has consented to  Procedure(s): RIGHT/LEFT HEART CATH AND CORONARY ANGIOGRAPHY (N/A) as a surgical intervention.  The patient's history has been reviewed, patient examined, no change in status, stable for surgery.  I have reviewed the patient's chart and labs.  Questions were answered to the patient's satisfaction.     Eligio Angert [employer]

## 2019-06-15 NOTE — Progress Notes (Addendum)
Patient may resume eliquis tomorrow , Saturday 4/24- Per Dr. Aundra Dubin.

## 2019-06-15 NOTE — Discharge Instructions (Signed)
Femoral Site Care This sheet gives you information about how to care for yourself after your procedure. Your health care provider may also give you more specific instructions. If you have problems or questions, contact your health care provider. What can I expect after the procedure? After the procedure, it is common to have:  Bruising that usually fades within 1-2 weeks.  Tenderness at the site. Follow these instructions at home: Wound care  Follow instructions from your health care provider about how to take care of your insertion site. Make sure you: ? Wash your hands with soap and water before you change your bandage (dressing). If soap and water are not available, use hand sanitizer. ? Change your dressing as told by your health care provider. ? Leave stitches (sutures), skin glue, or adhesive strips in place. These skin closures may need to stay in place for 2 weeks or longer. If adhesive strip edges start to loosen and curl up, you may trim the loose edges. Do not remove adhesive strips completely unless your health care provider tells you to do that.  Do not take baths, swim, or use a hot tub until your health care provider approves.  You may shower 24-48 hours after the procedure or as told by your health care provider. ? Gently wash the site with plain soap and water. ? Pat the area dry with a clean towel. ? Do not rub the site. This may cause bleeding.  Do not apply powder or lotion to the site. Keep the site clean and dry.  Check your femoral site every day for signs of infection. Check for: ? Redness, swelling, or pain. ? Fluid or blood. ? Warmth. ? Pus or a bad smell. Activity  For the first 2-3 days after your procedure, or as long as directed: ? Avoid climbing stairs as much as possible. ? Do not squat.  Do not lift anything that is heavier than 10 lb (4.5 kg), or the limit that you are told, until your health care provider says that it is safe.  Rest as  directed. ? Avoid sitting for a long time without moving. Get up to take short walks every 1-2 hours.  Do not drive for 24 hours if you were given a medicine to help you relax (sedative). General instructions  Take over-the-counter and prescription medicines only as told by your health care provider.  Keep all follow-up visits as told by your health care provider. This is important. Contact a health care provider if you have:  A fever or chills.  You have redness, swelling, or pain around your insertion site. Get help right away if:  The catheter insertion area swells very fast.  You pass out.  You suddenly start to sweat or your skin gets clammy.  The catheter insertion area is bleeding, and the bleeding does not stop when you hold steady pressure on the area.  The area near or just beyond the catheter insertion site becomes pale, cool, tingly, or numb. These symptoms may represent a serious problem that is an emergency. Do not wait to see if the symptoms will go away. Get medical help right away. Call your local emergency services (911 in the [place] Do not drive yourself to the hospital. Summary  After the procedure, it is common to have bruising that usually fades within 1-2 weeks.  Check your femoral site every day for signs of infection.  Do not lift anything that is heavier than 10 lb (4.5 kg), or the   limit that you are told, until your health care provider says that it is safe. This information is not intended to replace advice given to you by your health care provider. Make sure you discuss any questions you have with your health care provider. Document Revised: 02/21/2017 Document Reviewed: 02/21/2017 Elsevier Patient Education  2020 Elsevier Inc.  

## 2019-06-15 NOTE — Patient Outreach (Signed)
  [facility]) Care Management Chronic Special Needs Program    06/15/2019  Name: Stephanie Woodward, DOB: 1940-10-11  MRN: [id number]   Stephanie Woodward is enrolled in a chronic special needs plan for Diabetes. Telephone call to client for assessment follow up. Unable to reach. HIPAA compliant voice message left with call back phone number.   Per chart review client scheduled to have right/ left heart cath and coronary angiography today.   PLAN:  RNCM will attempt 2nd telephone outreach to client within 1 week.   Quinn Plowman RN,BSN,CCM Chronic Care Management Coordinator [facility] [phone] .

## 2019-06-18 ENCOUNTER — Other Ambulatory Visit: Payer: Self-pay

## 2019-06-18 NOTE — Patient Outreach (Signed)
[facility]) Care Management Chronic Special Needs Program  06/18/2019  Name: Estera Ozier DOB: [birth year]  MRN: [id number]  Ms. Shayle Donahoo is enrolled in a chronic special needs plan for Diabetes Telephone call to client. Unable to reach. HIPAA compliant message left with call back phone number and return call request.  Reviewed and updated care plan based on available data.  Per chart review client had right/ left heart cath and coronary angiography on 06/15/19.  Client continues to be followed by [facility]. Recent telephone visit completed with [facility] on 06/06/19. Next [facility] follow up visit scheduled for 07/13/19.    Goals Addressed            This Visit's Progress   .  Acknowledge receipt of Advanced Directive package   On track    Contact your assigned. RN case manager [phone] you have not received the Advanced directive packet.  Contact your assigned RN case manager if assistance is needed with completing Advanced directive.  Goal renewed 2021    . Client understands the importance of follow-up with providers by attending scheduled visits   On track    Most recent primary care provider visit 02/19/19 Most recent endocrinology visit 04/04/19 Most recent cardiology visit 03/05/19 Continue to maintain and keep follow up visits with your providers Goal renewed 2021    . Client verbalize knowledge of Heart Failure disease self management skills in 3 months   On track    Please refer to Health team advantage calendar for heart failure action plan.  Continue to take your medications as recommended Contact your doctor if you have questions or concerns.  Continue to keep your follow up visits with your doctor.  Goal renewed 2021     . Client will verbalize knowledge of chronic lung disease as evidenced by no ED visits or Inpatient stays related to chronic lung disease    On track    No ED visits noted per chart review.  Please refer to your health  team advantage calendar for review of COPD education Continue to take your medications as advised.  Contact your doctor if you have questions/ concerns,  Keep your follow up appointments with your doctor.  Goal renewed 2021    . Client will verbalize knowledge of self management of Hypertension as evidences by BP reading of 140/90 or less; or as defined by provider   On track    Please refer to your Health team advantage calendar for high blood pressure information Take your medications as prescribed Follow up with your provider as recommended.  Goal renewed 2021    . COMPLETED: General - Client will not be readmitted within 30 days (C-SNP) Discharge date 01/31/19       No hospital admission noted.      Marland Kitchen HEMOGLOBIN A1C < 7.0       Diabetes self management actions:  Glucose monitoring per provider recommendation  Visit provider every 3-6 months as directed  Hbg A1C level every 3-6 months.  Carbohydrate controlled meal planning  Taking diabetes medication as prescribed by provider  Physical activity  Please refer to your quarterly EMMI education articles sent by your Health team advantage plan.  Goal renewed 2021    . Maintain timely refills of diabetic medication as prescribed within the year .   On track    Per chart review of medical information client maintains timely refills of her diabetic medications.  Continue to take your medications as prescribed.  Contact your assigned  RN case manager [phone]) if your are unable to obtain your medications.  Goal renewed 2021     . Obtain annual  Lipid Profile, LDL-C   On track    Lipid profile completed 01/24/18 The goal for LDL is less than 70 mg/ dl as you are at high risk for complications try to avoid saturated fats, trans-fats, and eat more fiber.  RN case manager will send client education article: Heart health diet.  Goal renewed 2021    . Obtain Annual Eye (retinal)  Exam    On track    Eye exam 07/21/17 Plan to  schedule your eye exams yearly. Goal renewed 2021    . Obtain Annual Foot Exam       Diabetic foot exam 04/04/19 Diabetes foot care - Check feet daily at home (look for skin color changes, cuts, sores or cracks in the skin, swelling of feet or ankles, ingrown or fungal toenails, corn or calluses). Report these findings to your doctor - Wash feet with soap and water, dry feet well especially between toes - Moisturize your feet but not between the toes - Always wear shoes that protect your whole feet.  Goal renewed 2021    . Obtain annual screen for micro albuminuria (urine) , nephropathy (kidney problems)   On track    Micro albuminuria results not seen upon chart review. It is important to have your urine checked yearly for protein. Discuss this with your doctor.  Continue to obtain yearly physicals and lab checks as recommended by your provider.  Goal renewed 2021    . Obtain Hemoglobin A1C at least 2 times per year   On track    Hgb A1c completed 04/04/19 and 01/24/19 Continue to keep your follow up appointments with your provider and have lab work completed as recommended.  Goal renewed 2021    . Visit Primary Care Provider or Endocrinologist at least 2 times per year    On track    Most recent primary care provider visit 02/19/19 Most recent endocrinology visit 04/04/19      Assessment:  Assessment: Client is not meeting diabetes self-management goal of hemoglobin A1C of <7.0% with most recent reading of 7.1% on 04/04/19/21. Client's A1c has improved from previous reading of 8.9 on 01/24/19.    Plan:  Send successful outreach letter with a copy of their individualized care plan, Send individual care plan to provider and Send educational material  Chronic care management coordinator will outreach in:  6 Months   Quinn Plowman RN,BSN,CCM [facility] [phone]    .

## 2019-06-22 DIAGNOSIS — Z992 Dependence on renal dialysis: Secondary | ICD-10-CM | POA: Diagnosis not present

## 2019-06-22 DIAGNOSIS — N186 End stage renal disease: Secondary | ICD-10-CM | POA: Diagnosis not present

## 2019-06-22 DIAGNOSIS — I129 Hypertensive chronic kidney disease with stage 1 through stage 4 chronic kidney disease, or unspecified chronic kidney disease: Secondary | ICD-10-CM | POA: Diagnosis not present

## 2019-06-23 DIAGNOSIS — D689 Coagulation defect, unspecified: Secondary | ICD-10-CM | POA: Diagnosis not present

## 2019-06-23 DIAGNOSIS — Z992 Dependence on renal dialysis: Secondary | ICD-10-CM | POA: Diagnosis not present

## 2019-06-23 DIAGNOSIS — N2581 Secondary hyperparathyroidism of renal origin: Secondary | ICD-10-CM | POA: Diagnosis not present

## 2019-06-23 DIAGNOSIS — N186 End stage renal disease: Secondary | ICD-10-CM | POA: Diagnosis not present

## 2019-06-28 ENCOUNTER — Other Ambulatory Visit: Payer: Self-pay

## 2019-07-02 ENCOUNTER — Encounter: Payer: Self-pay | Admitting: Internal Medicine

## 2019-07-02 ENCOUNTER — Other Ambulatory Visit: Payer: Self-pay

## 2019-07-02 ENCOUNTER — Ambulatory Visit (INDEPENDENT_AMBULATORY_CARE_PROVIDER_SITE_OTHER): Payer: HMO | Admitting: Internal Medicine

## 2019-07-02 VITALS — BP 148/64 | HR 74 | Temp 98.8°F | Ht 61.0 in | Wt 194.6 lb

## 2019-07-02 DIAGNOSIS — N184 Chronic kidney disease, stage 4 (severe): Secondary | ICD-10-CM | POA: Diagnosis not present

## 2019-07-02 DIAGNOSIS — E1122 Type 2 diabetes mellitus with diabetic chronic kidney disease: Secondary | ICD-10-CM

## 2019-07-02 DIAGNOSIS — E1165 Type 2 diabetes mellitus with hyperglycemia: Secondary | ICD-10-CM

## 2019-07-02 DIAGNOSIS — Z794 Long term (current) use of insulin: Secondary | ICD-10-CM | POA: Diagnosis not present

## 2019-07-02 LAB — POCT GLYCOSYLATED HEMOGLOBIN (HGB A1C): Hemoglobin A1C: 7.5 % — AB (ref 4.0–5.6)

## 2019-07-02 NOTE — Progress Notes (Signed)
Name: Beda Dula  Age/ Sex: 79 y.o., female   MRN/ DOB: [id number], 1940-04-22     PCP: Alroy Dust, L.Marlou Sa, MD   Reason for Endocrinology Evaluation: Type 2 Diabetes Mellitus  Initial Endocrine Consultative Visit: 02/02/2019    PATIENT IDENTIFIER: Stephanie Woodward is a 79 y.o. female with a past medical history ofT2DM, HTN, ESRD on HD and CHF . The patient has followed with Endocrinology clinic since 02/02/2019 for consultative assistance with management of her diabetes.  DIABETIC HISTORY:  Stephanie Woodward was diagnosed with T2DM in 2005. She has been on oral glycemic agents in the past, recalls metformin but does not recall the rest. Her hemoglobin A1c has ranged from 8.8% in 2018, peaking at 8.9% in 2020.  On her initial visit to our clinic she had an A1c of 8.9%, she was on tresiba and Novolog. We continued the regimen but adjusted her doses.    SUBJECTIVE:   During the last visit (04/04/2019): A1c 7.1 %. We continued MDI regimen   Today (07/02/2019): Stephanie Woodward is here for a 3 month follow up on diabetes.  She checks her blood sugars occasionally, had stopped using the  . The patient has  had hypoglycemic episodes since the last clinic visit. Otherwise, the patient has not required any recent emergency interventions for hypoglycemia and has not had recent hospitalizations secondary to hyper or hypoglycemic episodes.        HOME DIABETES REGIMEN:  Tresiba 20 units  Novolog 10  units breakfast and ,12 units with supper     Statin: Yes ACE-I/ARB: No    GLUCOSE LOG: 125 - 300 mg/dL   DIABETIC COMPLICATIONS: Microvascular complications:   ESRD on HD   Denies: neuropathy , retinopathy   Last eye exam: Completed 2019  Macrovascular complications:   CHF  Denies: CAD, PVD, CVA  HISTORY:  Past Medical History:  Past Medical History:  Diagnosis Date  . Arthritis   . Asthma   . Atrial fibrillation ([place])   . CHF (congestive heart failure) ([place])   . CKD  (chronic kidney disease), stage III   . COPD (chronic obstructive pulmonary disease) ([place])   . Diabetes mellitus    INSULIN DEPENDENT  . Gout   . Heart murmur    no issues per pt  . History of kidney stones   . Hyperlipemia   . Hypertension   . Psoriasis   . Renal disorder    congenital  . Single kidney   . Sleep apnea    doesn't use the Cpap   Past Surgical History:  Past Surgical History:  Procedure Laterality Date  . ABDOMINAL HYSTERECTOMY    . AV FISTULA PLACEMENT Left 11/17/2018   Procedure: BRACHIO-CEPHALIC ARTERIOVENOUS (AV) FISTULA CREATION IN LEFT ARM;  Surgeon: Marty Heck, MD;  Location: [facility];  Service: Vascular;  Laterality: Left;  . CHOLECYSTECTOMY    . INSERTION OF DIALYSIS CATHETER Right 01/26/2019   Procedure: INSERTION OF DIALYSIS CATHETER, right internal jugular;  Surgeon: Angelia Mould, MD;  Location: [facility];  Service: Vascular;  Laterality: Right;  . LEFT AND RIGHT HEART CATHETERIZATION WITH CORONARY ANGIOGRAM N/A 11/29/2013   Procedure: LEFT AND RIGHT HEART CATHETERIZATION WITH CORONARY ANGIOGRAM;  Surgeon: Jacolyn Reedy, MD;  Location: [facility] CATH LAB;  Service: Cardiovascular;  Laterality: N/A;  . RIGHT HEART CATH N/A 11/23/2017   Procedure: RIGHT HEART CATH;  Surgeon: Larey Dresser, MD;  Location: [facility] CV LAB;  Service: Cardiovascular;  Laterality: N/A;  .  RIGHT/LEFT HEART CATH AND CORONARY ANGIOGRAPHY N/A 06/15/2019   Procedure: RIGHT/LEFT HEART CATH AND CORONARY ANGIOGRAPHY;  Surgeon: Larey Dresser, MD;  Location: [facility] CV LAB;  Service: Cardiovascular;  Laterality: N/A;    Social History:  reports that she quit smoking about 10 years ago. Her smoking use included cigarettes. She has a 20.00 pack-year smoking history. She has never used smokeless tobacco. She reports current alcohol use. She reports previous drug use. Family History:  Family History  Problem Relation Age of Onset  . Lupus Daughter   . Cancer Mother 6  .  CVA Father 50  . Cancer Brother 45     HOME MEDICATIONS: Allergies as of 07/02/2019      Reactions   Eggs Or Egg-derived Products Nausea And Vomiting   Lisinopril Nausea And Vomiting   Penicillins Nausea And Vomiting   Has patient had a PCN reaction causing immediate rash, facial/tongue/throat swelling, SOB or lightheadedness with hypotension: No Has patient had a PCN reaction causing severe rash involving mucus membranes or skin necrosis: No Has patient had a PCN reaction that required hospitalization: No Has patient had a PCN reaction occurring within the last 10 years: No If all of the above answers are "NO", then may proceed with Cephalosporin use.      Medication List       Accurate as of Jul 02, 2019 10:38 AM. If you have any questions, ask your nurse or doctor.        albuterol 108 (90 Base) MCG/ACT inhaler Commonly known as: VENTOLIN HFA Inhale 2 puffs into the lungs every 4 (four) hours as needed for shortness of breath.   apixaban 5 MG Tabs tablet Commonly known as: Eliquis Take 1 tablet (5 mg total) by mouth 2 (two) times daily.   atorvastatin 80 MG tablet Commonly known as: LIPITOR Take 80 mg by mouth every evening.   Dialyvite 800-Zinc 15 0.8 MG Tabs Take 1 tablet by mouth every evening.   ethyl chloride spray Apply 1 application topically Every Tuesday,Thursday,and Saturday with dialysis.   fluticasone 0.005 % ointment Commonly known as: CUTIVATE Apply 1 application topically 2 (two) times daily as needed (skin irritation/rash).   FreeStyle Libre 14 Day Sensor Misc 1 every 14 days E11.65   FreeStyle [employer] 1 kit by Does not apply route daily. Use as instructed E11.65   glucose blood test strip Use to check blood sugar 4 times a day. E11.65   guaiFENesin 600 MG 12 hr tablet Commonly known as: MUCINEX Take 600 mg by mouth 2 (two) times daily as needed for cough.   hydrALAZINE 100 MG tablet Commonly known as: APRESOLINE Take 50 mg by  mouth 3 (three) times daily.   isosorbide mononitrate 30 MG 24 hr tablet Commonly known as: IMDUR Take 1 tablet by mouth once daily   latanoprost 0.005 % ophthalmic solution Commonly known as: XALATAN Place 1 drop into both eyes at bedtime.   metoprolol succinate 25 MG 24 hr tablet Commonly known as: TOPROL-XL Take 2 tablets (50 mg total) by mouth every morning AND 1 tablet (25 mg total) every evening.   Narcan 4 MG/0.1ML Liqd nasal spray kit Generic drug: naloxone Place 1 spray into the nose See admin instructions. Call EMS at first sign of opioid overdose. Place on spray in nose.  If no response, then place one spray ing the opposite side of the nose.   NovoLOG FlexPen 100 UNIT/ML FlexPen Generic drug: insulin aspart Inject 8 Units  into the skin 2 (two) times daily with breakfast and lunch AND 10 Units daily with supper. What changed: See the new instructions.   ondansetron 4 MG tablet Commonly known as: ZOFRAN Take 4 mg by mouth 2 (two) times daily as needed for nausea.   pantoprazole 40 MG tablet Commonly known as: PROTONIX Take 40 mg by mouth every evening.   torsemide 20 MG tablet Commonly known as: DEMADEX Take 40 mg by mouth daily.   traMADol 50 MG tablet Commonly known as: ULTRAM Take 50 mg by mouth every 6 (six) hours as needed for severe pain (for pain.).   Tyler Aas FlexTouch 100 UNIT/ML FlexTouch Pen Generic drug: insulin degludec Inject 0.2 mLs (20 Units total) into the skin daily.   triamcinolone cream 0.1 % Commonly known as: KENALOG Apply 1 application topically 2 (two) times daily as needed (skin rash/irritation.).        OBJECTIVE:   Vital Signs: BP (!) 148/64 (BP Location: Right Arm, Patient Position: Sitting, Cuff Size: Normal)   Pulse 74   Temp 98.8 F (37.1 C)   Ht 5' 1" (1.549 m)   Wt 194 lb 9.6 oz (88.3 kg)   SpO2 94%   BMI 36.77 kg/m   Wt Readings from Last 3 Encounters:  07/02/19 194 lb 9.6 oz (88.3 kg)  06/15/19 193 lb (87.5  kg)  06/04/19 193 lb 6.4 oz (87.7 kg)     Exam: General: Pt appears well and is in NAD  Lungs: Clear with good BS bilat with no rales, rhonchi, or wheezes  Heart: RRR with normal S1 and S2 and no gallops; no murmurs; no rub  Extremities: No pretibial edema.  Neuro: MS is good with appropriate affect, pt is alert and Ox3    DM foot exam:04/04/2019      The skin of the feet is intact without sores or ulcerations. Pt has a chronic rash on the right medial aspect of leg  The pedal pulses are 1+ on right and 1+ on left. The sensation is intact to a screening 5.07, 10 gram monofilament bilaterally        DATA REVIEWED:  Lab Results  Component Value Date   HGBA1C 7.5 (A) 07/02/2019   HGBA1C 7.1 (A) 04/04/2019   HGBA1C 8.9 (H) 01/24/2019   Lab Results  Component Value Date   LDLCALC 97 03/27/2007   CREATININE 4.20 (H) 06/15/2019     Lab Results  Component Value Date   CHOL 205 (H) 03/27/2007   HDL 79 03/27/2007   LDLCALC 97 03/27/2007   TRIG 147 03/27/2007   CHOLHDL 2.6 Ratio 03/27/2007         ASSESSMENT / PLAN / RECOMMENDATIONS:   1) Type 2 Diabetes Mellitus, Optimally controlled, With ESRD  on HD- Most recent A1c of 7.5 %. Goal A1c < 7.5 %.    - A1c remains at goal - Unable to use the CGM due to sensor malfunction, has contacted the company - Overall her BG's have remained in the 150-170 range but did have the rare BG's in 300's , which the pt attributed to dietary indiscretion but has been using the correction scale without any issues.   MEDICATIONS: - Continue  Tresiba at 20 units daily  - Continue Novolog 10 units with Breakfast and lunch and 12 units with Supper - CF : Novolog (BG-135/35)   EDUCATION / INSTRUCTIONS:  BG monitoring instructions: Patient is instructed to check her blood sugars 4 times a day, before meals and bedtime .  Call [facility] if: BG persistently < 70 or > 300. . I reviewed the Rule of 15 for the treatment of  hypoglycemia in detail with the patient. Literature supplied.    F/U in 4 months    Signed electronically by: Mack Guise, MD  [facility] Endocrinology  [facility] [address] [place], [place] [address] Phone: [phone] FAX: [phone]   CC: Aurea Graff.Marlou Sa, [place] [address] [employer] [address] [place] [address] Phone: [phone]  Fax: [phone]  Return to Endocrinology clinic as below: Future Appointments  Date Time Provider [place]  07/20/2019  4:00 PM Collene Gobble, MD [facility] None  08/22/2019 11:00 AM Marzetta Board, DPM [facility]  10/24/2019 10:20 AM Larey Dresser, MD [facility] None  12/03/2019 10:00 AM Dannielle Karvonen, RN [facility] None

## 2019-07-02 NOTE — Patient Instructions (Signed)
-   Continue  Tresiba at 20 units daily   - Novolog 10 units with Breakfast and lunch and 12 units with Supper  - Novolog correctional insulin: You can use this scale if your sugar is too high but its not time for you to eat yet.   Blood sugar before meal Number of units to inject  Less than 170 0 unit  171 -  205 1 units  206 -  240 2 units  241-  275 3 units  276 - 310 4 units  311- 345 5 units  346- 380 6 units  381- 415 7 units  416- 450 8 units      HOW TO TREAT LOW BLOOD SUGARS (Blood sugar LESS THAN 70 MG/DL)  Please follow the RULE OF 15 for the treatment of hypoglycemia treatment (when your (blood sugars are less than 70 mg/dL)    STEP 1: Take 15 grams of carbohydrates when your blood sugar is low, which includes:   3-4 GLUCOSE TABS  OR  3-4 OZ OF JUICE OR REGULAR SODA OR  ONE TUBE OF GLUCOSE GEL     STEP 2: RECHECK blood sugar in 15 MINUTES STEP 3: If your blood sugar is still low at the 15 minute recheck --> then, go back to STEP 1 and treat AGAIN with another 15 grams of carbohydrates.

## 2019-07-20 ENCOUNTER — Ambulatory Visit: Payer: HMO | Admitting: Emergency Medicine

## 2019-07-20 ENCOUNTER — Encounter: Payer: Self-pay | Admitting: Emergency Medicine

## 2019-07-20 ENCOUNTER — Ambulatory Visit (INDEPENDENT_AMBULATORY_CARE_PROVIDER_SITE_OTHER): Payer: HMO

## 2019-07-20 ENCOUNTER — Other Ambulatory Visit: Payer: Self-pay

## 2019-07-20 DIAGNOSIS — G4733 Obstructive sleep apnea (adult) (pediatric): Secondary | ICD-10-CM

## 2019-07-20 DIAGNOSIS — R0602 Shortness of breath: Secondary | ICD-10-CM

## 2019-07-20 MED ORDER — SPIRIVA RESPIMAT 2.5 MCG/ACT IN AERS
2.0000 | INHALATION_SPRAY | Freq: Every day | RESPIRATORY_TRACT | 5 refills | Status: DC
Start: 2019-07-20 — End: 2020-01-11

## 2019-07-20 NOTE — Progress Notes (Signed)
Subjective:    Patient ID: Stephanie Woodward, female    DOB: [birth year], 79 y.o.   MRN: [id number]  HPI 79 year old woman, former smoker (20 pack years) with ESRD on HD since 2019, atrial fibrillation, hypertension, psoriasis, OSA not on CPAP.  Suspected mixed restriction and obstruction based on prior spirometry's, curve of her flow volume loop.  She is referred today for evaluation of progressive dyspnea. She describes progressive dyspnea over several years time. She is ok with sitting still, but will have dyspnea with even short walks. Sometimes assoc with chest heaviness. Weight has been stable and she is able to reach dry weight. Sometimes hears wheeze - rare w exertion. She has some cough, better over the last several months.   She has albuterol which she uses with exertion, not every day.  She is not on any scheduled bronchodilators.  She underwent right and left heart catheterization on 06/15/2019 which I have reviewed, shows no significant CAD, intact cardiac output, normal filling pressures and mild PAH.  Pulmonary function testing reviewed from 2016 and from 11/25/2017.  Most recent show a restricted pattern on spirometry with possible superimposed obstruction, normal TLC but decreased RV.  She was unable to do diffusion capacity.   Review of Systems As per HPI  Past Medical History:  Diagnosis Date  . Arthritis   . Asthma   . Atrial fibrillation ([place])   . CHF (congestive heart failure) ([place])   . CKD (chronic kidney disease), stage III   . COPD (chronic obstructive pulmonary disease) ([place])   . Diabetes mellitus    INSULIN DEPENDENT  . Gout   . Heart murmur    no issues per pt  . History of kidney stones   . Hyperlipemia   . Hypertension   . Psoriasis   . Renal disorder    congenital  . Single kidney   . Sleep apnea    doesn't use the Cpap     Family History  Problem Relation Age of Onset  . Lupus Daughter   . Cancer Mother 60  . CVA Father 14  . Cancer Brother  92     Social History   Socioeconomic History  . Marital status: Widowed    Spouse name: Not on file  . Number of children: 2  . Years of education: Not on file  . Highest education level: Not on file  Occupational History  . Occupation: retired  Tobacco Use  . Smoking status: Former Smoker    Packs/day: 1.00    Years: 20.00    Pack years: 20.00    Types: Cigarettes    Quit date: 02/22/2009    Years since quitting: 10.4  . Smokeless tobacco: Never Used  Substance and Sexual Activity  . Alcohol use: Yes    Alcohol/week: 0.0 standard drinks    Comment: occasional beer  . Drug use: Not Currently    Comment: marijuana in the past  . Sexual activity: Never  Other Topics Concern  . Not on file  Social History Narrative  . Not on file   Social Determinants of Health   Financial Resource Strain:   . Difficulty of Paying Living Expenses:   Food Insecurity:   . Worried About Charity fundraiser in the Last Year:   . Arboriculturist in the Last Year:   Transportation Needs:   . Film/video editor (Medical):   Marland Kitchen Lack of Transportation (Non-Medical):   Physical Activity:   . Days of  Exercise per Week:   . Minutes of Exercise per Session:   Stress:   . Feeling of Stress :   Social Connections:   . Frequency of Communication with Friends and Family:   . Frequency of Social Gatherings with Friends and Family:   . Attends Religious Services:   . Active Member of Clubs or Organizations:   . Attends Archivist Meetings:   Marland Kitchen Marital Status:   Intimate Partner Violence:   . Fear of Current or Ex-Partner:   . Emotionally Abused:   Marland Kitchen Physically Abused:   . 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  No animals.   Allergies  Allergen Reactions  . Eggs Or Egg-Derived Products Nausea And Vomiting  . Lisinopril Nausea And Vomiting  . Penicillins Nausea And Vomiting    Has patient had  a PCN reaction causing immediate rash, facial/tongue/throat swelling, SOB or lightheadedness with hypotension: No Has patient had a PCN reaction causing severe rash involving mucus membranes or skin necrosis: No Has patient had a PCN reaction that required hospitalization: No Has patient had a PCN reaction occurring within the last 10 years: No If all of the above answers are "NO", then may proceed with Cephalosporin use.      Outpatient Medications Prior to Visit  Medication Sig Dispense Refill  . albuterol (PROVENTIL HFA;VENTOLIN HFA) 108 (90 BASE) MCG/ACT inhaler Inhale 2 puffs into the lungs every 4 (four) hours as needed for shortness of breath.     Marland Kitchen apixaban (ELIQUIS) 5 MG TABS tablet Take 1 tablet (5 mg total) by mouth 2 (two) times daily. 180 tablet 3  . atorvastatin (LIPITOR) 80 MG tablet Take 80 mg by mouth every evening.     . B Complex-C-Zn-Folic Acid (DIALYVITE 332-RJJO 15) 0.8 MG TABS Take 1 tablet by mouth every evening.     . Continuous Blood Gluc Receiver (FREESTYLE LIBRE READER) DEVI 1 kit by Does not apply route daily. Use as instructed E11.65 1 each 0  . Continuous Blood Gluc Sensor (FREESTYLE LIBRE 14 DAY SENSOR) MISC 1 every 14 days E11.65 6 each 0  . ethyl chloride spray Apply 1 application topically Every Tuesday,Thursday,and Saturday with dialysis.     . fluticasone (CUTIVATE) 0.005 % ointment Apply 1 application topically 2 (two) times daily as needed (skin irritation/rash).   2  . glucose blood test strip Use to check blood sugar 4 times a day. E11.65 400 each 12  . guaiFENesin (MUCINEX) 600 MG 12 hr tablet Take 600 mg by mouth 2 (two) times daily as needed for cough.     . hydrALAZINE (APRESOLINE) 100 MG tablet Take 50 mg by mouth 3 (three) times daily.    . insulin aspart (NOVOLOG FLEXPEN) 100 UNIT/ML FlexPen Inject 8 Units into the skin 2 (two) times daily with breakfast and lunch AND 10 Units daily with supper. (Patient taking differently: Inject 10 Units into the  skin 2 (two) times daily with breakfast and lunch AND 12 Units daily with supper.) 15 mL 6  . insulin degludec (TRESIBA FLEXTOUCH) 100 UNIT/ML SOPN FlexTouch Pen Inject 0.2 mLs (20 Units total) into the skin daily. 15 mL 6  . isosorbide mononitrate (IMDUR) 30 MG 24 hr tablet Take 1 tablet by mouth once daily (Patient taking differently: Take 30 mg by mouth daily. ) 90 tablet 3  . latanoprost (XALATAN) 0.005 % ophthalmic solution Place 1 drop  into both eyes at bedtime.    . metoprolol succinate (TOPROL-XL) 25 MG 24 hr tablet Take 2 tablets (50 mg total) by mouth every morning AND 1 tablet (25 mg total) every evening. 90 tablet 6  . NARCAN 4 MG/0.1ML LIQD nasal spray kit Place 1 spray into the nose See admin instructions. Call EMS at first sign of opioid overdose. Place on spray in nose.  If no response, then place one spray ing the opposite side of the nose.    . ondansetron (ZOFRAN) 4 MG tablet Take 4 mg by mouth 2 (two) times daily as needed for nausea.     . pantoprazole (PROTONIX) 40 MG tablet Take 40 mg by mouth every evening.     . torsemide (DEMADEX) 20 MG tablet Take 40 mg by mouth daily.    . traMADol (ULTRAM) 50 MG tablet Take 50 mg by mouth every 6 (six) hours as needed for severe pain (for pain.).     Marland Kitchen triamcinolone cream (KENALOG) 0.1 % Apply 1 application topically 2 (two) times daily as needed (skin rash/irritation.).      No facility-administered medications prior to visit.         Objective:   Physical Exam  Vitals:   07/20/19 1556  BP: 114/62  Pulse: 92  Temp: (!) 97.2 F (36.2 C)  TempSrc: Oral  SpO2: 95%  Weight: 192 lb 12.8 oz (87.5 kg)  Height: '5\' 2"'  (1.575 m)   Gen: Pleasant, obese woman, in no distress,  normal affect  ENT: No lesions,  mouth clear,  oropharynx clear w M3-4 airway, no postnasal drip  Neck: No JVD, no stridor  Lungs: No use of accessory muscles, no crackles or wheezing on normal respiration, no wheeze on forced  expiration  Cardiovascular: RRR, heart sounds normal, no murmur or gallops, no peripheral edema  Musculoskeletal: No deformities, no cyanosis or clubbing  Neuro: alert, awake, non focal  Skin: Warm, no lesions or rash      Assessment & Plan:  OSA (obstructive sleep apnea) Mild disease, never tolerated CPAP.  I think that there would be benefit to repeating her split-night sleep study, reassessing for ability to wear CPAP.  She is willing to do this and we will arrange  SOB (shortness of breath) Certainly several factors that could be contributing including restriction from obesity, volume status, diastolic heart function.  That being said she does not appear to be fluid overloaded, always reaches her target dry weight with HD.  Also she had a recent catheterization 06/15/2019 that showed adequate filling pressures and good cardiac output.  Her cardiac and renal status appear to be in good balance.  Her pulmonary function testing from 2016 and 2019 shows significant restriction on spirometry, probable mixed disease with superimposed obstruction.  She tells me that she benefited in the past from [place], still benefits from albuterol with exertion.  I think it is reasonable to try her again on scheduled bronchodilator therapy.  We will start with Spiriva Respimat and see how she responds.  She will also keep albuterol available to use if needed.  Plan to repeat her pulmonary function testing at some point going forward.  We will check a chest x-ray today.  She needs a walking oximetry to ensure that she does not have occult desaturation.  CXR today.  Walking oximetry today on room air.  We will refer you back to the sleep lab to assess your sleep apnea.  Please start Spiriva Respimat 2 puff once  a day.  Keep albuterol available to use 2 puffs up to every 4 hours if needed for shortness of breath, chest tightness, wheezing.  Follow with Dr. Lamonte Sakai in 2 months or sooner if you have any  problems.  Baltazar Apo, MD, PhD 07/20/2019, 4:38 PM [facility] [phone] or if no answer [phone]

## 2019-07-20 NOTE — Addendum Note (Signed)
Addended by: Gavin Potters R on: 07/20/2019 05:13 PM   Modules accepted: Orders

## 2019-07-20 NOTE — Addendum Note (Signed)
Addended by: Gavin Potters R on: 07/20/2019 05:21 PM   Modules accepted: Orders

## 2019-07-20 NOTE — Assessment & Plan Note (Signed)
Certainly several factors that could be contributing including restriction from obesity, volume status, diastolic heart function.  That being said she does not appear to be fluid overloaded, always reaches her target dry weight with HD.  Also she had a recent catheterization 06/15/2019 that showed adequate filling pressures and good cardiac output.  Her cardiac and renal status appear to be in good balance.  Her pulmonary function testing from 2016 and 2019 shows significant restriction on spirometry, probable mixed disease with superimposed obstruction.  She tells me that she benefited in the past from [place], still benefits from albuterol with exertion.  I think it is reasonable to try her again on scheduled bronchodilator therapy.  We will start with Spiriva Respimat and see how she responds.  She will also keep albuterol available to use if needed.  Plan to repeat her pulmonary function testing at some point going forward.  We will check a chest x-ray today.  She needs a walking oximetry to ensure that she does not have occult desaturation.  CXR today.  Walking oximetry today on room air.  We will refer you back to the sleep lab to assess your sleep apnea.  Please start Spiriva Respimat 2 puff once a day.  Keep albuterol available to use 2 puffs up to every 4 hours if needed for shortness of breath, chest tightness, wheezing.  Follow with Dr. Lamonte Sakai in 2 months or sooner if you have any problems.

## 2019-07-20 NOTE — Patient Instructions (Addendum)
CXR today.  Walking oximetry today on room air.  We will refer you back to the sleep lab to assess your sleep apnea.  Please start Spiriva Respimat 2 puff once a day.  Keep albuterol available to use 2 puffs up to every 4 hours if needed for shortness of breath, chest tightness, wheezing.  Follow with Dr. Lamonte Sakai in 2 months or sooner if you have any problems.

## 2019-07-20 NOTE — Assessment & Plan Note (Signed)
Mild disease, never tolerated CPAP.  I think that there would be benefit to repeating her split-night sleep study, reassessing for ability to wear CPAP.  She is willing to do this and we will arrange

## 2019-07-23 DIAGNOSIS — N186 End stage renal disease: Secondary | ICD-10-CM | POA: Diagnosis not present

## 2019-07-23 DIAGNOSIS — I129 Hypertensive chronic kidney disease with stage 1 through stage 4 chronic kidney disease, or unspecified chronic kidney disease: Secondary | ICD-10-CM | POA: Diagnosis not present

## 2019-07-23 DIAGNOSIS — Z992 Dependence on renal dialysis: Secondary | ICD-10-CM | POA: Diagnosis not present

## 2019-07-24 DIAGNOSIS — Z992 Dependence on renal dialysis: Secondary | ICD-10-CM | POA: Diagnosis not present

## 2019-07-24 DIAGNOSIS — N2581 Secondary hyperparathyroidism of renal origin: Secondary | ICD-10-CM | POA: Diagnosis not present

## 2019-07-24 DIAGNOSIS — D689 Coagulation defect, unspecified: Secondary | ICD-10-CM | POA: Diagnosis not present

## 2019-07-24 DIAGNOSIS — N186 End stage renal disease: Secondary | ICD-10-CM | POA: Diagnosis not present

## 2019-07-25 ENCOUNTER — Other Ambulatory Visit (HOSPITAL_COMMUNITY): Payer: Self-pay | Admitting: Cardiology

## 2019-08-03 ENCOUNTER — Other Ambulatory Visit (HOSPITAL_COMMUNITY)
Admission: RE | Admit: 2019-08-03 | Discharge: 2019-08-03 | Disposition: A | Discharge status: Home / Self Care | Payer: HMO | Source: Ambulatory Visit | Attending: Pulmonary Disease | Admitting: Pulmonary Disease

## 2019-08-03 DIAGNOSIS — Z20822 Contact with and (suspected) exposure to covid-19: Secondary | ICD-10-CM | POA: Insufficient documentation

## 2019-08-03 DIAGNOSIS — Z01812 Encounter for preprocedural laboratory examination: Secondary | ICD-10-CM | POA: Insufficient documentation

## 2019-08-03 LAB — SARS CORONAVIRUS 2 (TAT 6-24 HRS): SARS Coronavirus 2: NEGATIVE

## 2019-08-05 ENCOUNTER — Other Ambulatory Visit: Payer: Self-pay

## 2019-08-05 ENCOUNTER — Ambulatory Visit (HOSPITAL_BASED_OUTPATIENT_CLINIC_OR_DEPARTMENT_OTHER): Payer: HMO | Attending: Emergency Medicine | Admitting: Pulmonary Disease

## 2019-08-05 VITALS — Ht 62.0 in | Wt 192.0 lb

## 2019-08-05 DIAGNOSIS — G4733 Obstructive sleep apnea (adult) (pediatric): Secondary | ICD-10-CM | POA: Diagnosis not present

## 2019-08-05 DIAGNOSIS — R0683 Snoring: Secondary | ICD-10-CM

## 2019-08-06 DIAGNOSIS — G4733 Obstructive sleep apnea (adult) (pediatric): Secondary | ICD-10-CM | POA: Diagnosis not present

## 2019-08-06 NOTE — Procedures (Signed)
    Patient Name: Stephanie Woodward, Stephanie Woodward Date: 08/05/2019 Gender: Female D.O.B: 17-Sep-1940 Age (years): 78 Referring Provider: Baltazar Apo Height (inches): 4 Interpreting Physician: Chesley Mires MD, ABSM Weight (lbs): 192 RPSGT: Baxter Flattery BMI: 35 MRN: [id number] Neck Size: 16.00  CLINICAL INFORMATION Sleep Study Type: NPSG  Indication for sleep study: Snoring, Witnesses Apnea / Gasping During Sleep  Epworth Sleepiness Score: 3  SLEEP STUDY TECHNIQUE As per the AASM Manual for the Scoring of Sleep and Associated Events v2.3 (April 2016) with a hypopnea requiring 4% desaturations.  The channels recorded and monitored were frontal, central and occipital EEG, electrooculogram (EOG), submentalis EMG (chin), nasal and oral airflow, thoracic and abdominal wall motion, anterior tibialis EMG, snore microphone, electrocardiogram, and pulse oximetry.  MEDICATIONS Medications self-administered by patient taken the night of the study : N/A  SLEEP ARCHITECTURE The study was initiated at 10:17:59 PM and ended at 4:56:37 AM.  Sleep onset time was 18.3 minutes and the sleep efficiency was 75.2%%. The total sleep time was 299.8 minutes.  Stage REM latency was 85.5 minutes.  The patient spent 3.7%% of the night in stage N1 sleep, 92.8%% in stage N2 sleep, 0.0%% in stage N3 and 3.5% in REM.  Alpha intrusion was absent.  Supine sleep was 46.36%.  RESPIRATORY PARAMETERS The overall apnea/hypopnea index (AHI) was 3.0 per hour. There were 10 total apneas, including 9 obstructive, 1 central and 0 mixed apneas. There were 5 hypopneas and 0 RERAs.  The AHI during Stage REM sleep was 17.1 per hour.  AHI while supine was 0.9 per hour.  The mean oxygen saturation was 93.4%. The minimum SpO2 during sleep was 89.0%.  moderate snoring was noted during this study.  CARDIAC DATA The 2 lead EKG demonstrated sinus rhythm. The mean heart rate was 0.3 beats per minute. Other EKG findings  include: PVCs.  LEG MOVEMENT DATA The total PLMS were 0 with a resulting PLMS index of 0.0. Associated arousal with leg movement index was 3.0 .  IMPRESSIONS - While she had several obstructive respiratory events, these were not frequent enough to qualify for diagnosis of obstructive sleep apnea.  Her overall AHI was 3 with an SpO2 low of 89%. - The patient snored with moderate snoring volume. - EKG findings include PVCs. - Clinically significant periodic limb movements did not occur during sleep. No significant associated arousals.  DIAGNOSIS - Snoring.  RECOMMENDATIONS - Avoid alcohol, sedatives and other CNS depressants that may worsen sleep apnea and disrupt normal sleep architecture. - Sleep hygiene should be reviewed to assess factors that may improve sleep quality. - Weight management and regular exercise should be initiated or continued if appropriate.  [Electronically signed] 08/06/2019 12:02 PM  Chesley Mires MD, [employer], American Board of Sleep Medicine   NPI: [id number]

## 2019-08-22 ENCOUNTER — Ambulatory Visit: Payer: HMO | Admitting: Podiatry

## 2019-08-22 DIAGNOSIS — T82858A Stenosis of vascular prosthetic devices, implants and grafts, initial encounter: Secondary | ICD-10-CM | POA: Diagnosis not present

## 2019-08-22 DIAGNOSIS — I871 Compression of vein: Secondary | ICD-10-CM | POA: Diagnosis not present

## 2019-08-22 DIAGNOSIS — I129 Hypertensive chronic kidney disease with stage 1 through stage 4 chronic kidney disease, or unspecified chronic kidney disease: Secondary | ICD-10-CM | POA: Diagnosis not present

## 2019-08-22 DIAGNOSIS — N186 End stage renal disease: Secondary | ICD-10-CM | POA: Diagnosis not present

## 2019-08-22 DIAGNOSIS — Z992 Dependence on renal dialysis: Secondary | ICD-10-CM | POA: Diagnosis not present

## 2019-08-23 DIAGNOSIS — N2581 Secondary hyperparathyroidism of renal origin: Secondary | ICD-10-CM | POA: Diagnosis not present

## 2019-08-23 DIAGNOSIS — Z23 Encounter for immunization: Secondary | ICD-10-CM | POA: Diagnosis not present

## 2019-08-23 DIAGNOSIS — E1122 Type 2 diabetes mellitus with diabetic chronic kidney disease: Secondary | ICD-10-CM | POA: Diagnosis not present

## 2019-08-23 DIAGNOSIS — Z283 Underimmunization status: Secondary | ICD-10-CM | POA: Diagnosis not present

## 2019-08-23 DIAGNOSIS — R197 Diarrhea, unspecified: Secondary | ICD-10-CM | POA: Diagnosis not present

## 2019-08-23 DIAGNOSIS — D689 Coagulation defect, unspecified: Secondary | ICD-10-CM | POA: Diagnosis not present

## 2019-08-23 DIAGNOSIS — L299 Pruritus, unspecified: Secondary | ICD-10-CM | POA: Diagnosis not present

## 2019-08-23 DIAGNOSIS — Z992 Dependence on renal dialysis: Secondary | ICD-10-CM | POA: Diagnosis not present

## 2019-08-23 DIAGNOSIS — N186 End stage renal disease: Secondary | ICD-10-CM | POA: Diagnosis not present

## 2019-09-12 ENCOUNTER — Other Ambulatory Visit: Payer: Self-pay

## 2019-09-12 MED ORDER — FREESTYLE LIBRE READER DEVI: 1.0000 | Freq: Every day | 0 refills | Status: DC

## 2019-09-12 MED ORDER — FREESTYLE LIBRE 14 DAY SENSOR MISC: 0 refills | Status: DC

## 2019-09-19 ENCOUNTER — Ambulatory Visit: Payer: HMO | Admitting: Emergency Medicine

## 2019-09-22 DIAGNOSIS — Z992 Dependence on renal dialysis: Secondary | ICD-10-CM | POA: Diagnosis not present

## 2019-09-22 DIAGNOSIS — I129 Hypertensive chronic kidney disease with stage 1 through stage 4 chronic kidney disease, or unspecified chronic kidney disease: Secondary | ICD-10-CM | POA: Diagnosis not present

## 2019-09-22 DIAGNOSIS — N186 End stage renal disease: Secondary | ICD-10-CM | POA: Diagnosis not present

## 2019-09-25 DIAGNOSIS — N2581 Secondary hyperparathyroidism of renal origin: Secondary | ICD-10-CM | POA: Diagnosis not present

## 2019-09-25 DIAGNOSIS — Z992 Dependence on renal dialysis: Secondary | ICD-10-CM | POA: Diagnosis not present

## 2019-09-25 DIAGNOSIS — D689 Coagulation defect, unspecified: Secondary | ICD-10-CM | POA: Diagnosis not present

## 2019-09-25 DIAGNOSIS — N186 End stage renal disease: Secondary | ICD-10-CM | POA: Diagnosis not present

## 2019-10-01 ENCOUNTER — Ambulatory Visit: Payer: HMO | Admitting: Pulmonary Disease

## 2019-10-01 ENCOUNTER — Other Ambulatory Visit: Payer: Self-pay

## 2019-10-01 ENCOUNTER — Encounter: Payer: Self-pay | Admitting: Pulmonary Disease

## 2019-10-01 VITALS — BP 120/60 | HR 72 | Temp 97.4°F | Ht 63.0 in | Wt 198.0 lb

## 2019-10-01 DIAGNOSIS — R0602 Shortness of breath: Secondary | ICD-10-CM | POA: Diagnosis not present

## 2019-10-01 DIAGNOSIS — N184 Chronic kidney disease, stage 4 (severe): Secondary | ICD-10-CM | POA: Diagnosis not present

## 2019-10-01 MED ORDER — STIOLTO RESPIMAT 2.5-2.5 MCG/ACT IN AERS
2.0000 | INHALATION_SPRAY | Freq: Every day | RESPIRATORY_TRACT | 0 refills | Status: DC
Start: 2019-10-01 — End: 2020-01-11

## 2019-10-01 NOTE — Patient Instructions (Addendum)
You were seen today by Stephanie Rinne, NP  for:   1. SOB (shortness of breath)  - Pulmonary function test; Future  As discussed in office today we will repeat a breathing test to compare to your 2019 breathing test  Trial of Stiolto Respimat inhaler >>>2 puffs daily >>>Take this no matter what >>>This is not a rescue inhaler  Please stop your Spiriva Respimat  Please let Stephanie Woodward know when you finish the first sample of the Stiolto Respimat that we have provided for you.  Let Stephanie Woodward know how you are tolerating this.  If you feel it is helping and you are tolerating the medication we can send in a long-term prescription  2. Chronic kidney disease (CKD), stage IV (severe) (HCC)  Keep follow-up with hemodialysis on Tuesday, Thursday, Saturday  Keep follow-up with nephrology   We recommend today:  Orders Placed This Encounter  Procedures  . Pulmonary function test    Standing Status:   Future    Standing Expiration Date:   09/30/2020    Order Specific Question:   Where should this test be performed?    Answer:   [facility]    Order Specific Question:   Full PFT: includes the following: basic spirometry, spirometry pre & post bronchodilator, diffusion capacity (DLCO), lung volumes    Answer:   Full PFT   Orders Placed This Encounter  Procedures  . Pulmonary function test   Meds ordered this encounter  Medications  . Tiotropium Bromide-Olodaterol (STIOLTO RESPIMAT) 2.5-2.5 MCG/ACT AERS    Sig: Inhale 2 puffs into the lungs daily.    Dispense:  4 g    Refill:  0    Follow Up:    Return in about 3 months (around 01/01/2020), or if symptoms worsen or fail to improve, for Follow up with Dr. Lamonte Sakai, Follow up for FULL PFT - 60 min.   Please do your part to reduce the spread of COVID-19:      Reduce your risk of any infection  and COVID19 by using the similar precautions used for avoiding the common cold or flu:  Marland Kitchen Wash your hands often with soap and warm water for at least 20  seconds.  If soap and water are not readily available, use an alcohol-based hand sanitizer with at least 60% alcohol.  . If coughing or sneezing, cover your mouth and nose by coughing or sneezing into the elbow areas of your shirt or coat, into a tissue or into your sleeve (not your hands). Langley Gauss A MASK when in public  . Avoid shaking hands with others and consider head nods or verbal greetings only. . Avoid touching your eyes, nose, or mouth with unwashed hands.  . Avoid close contact with people who are sick. . Avoid places or events with large numbers of people in one location, like concerts or sporting events. . If you have some symptoms but not all symptoms, continue to monitor at home and seek medical attention if your symptoms worsen. . If you are having a medical emergency, call 911.   [place] / e-Visit: [email or web address]         [facility]: [phone]  [facility]: [phone]                   [facility]: [phone]     It is flu season:   >>> Best ways to protect herself from the  flu: Receive the yearly flu vaccine, practice good hand hygiene washing with soap and also using hand sanitizer when available, eat a nutritious meals, get adequate rest, hydrate appropriately   Please contact the office if your symptoms worsen or you have concerns that you are not improving.   Thank you for choosing [facility] for your healthcare, and for allowing Stephanie Woodward to partner with you on your healthcare journey. I am thankful to be able to provide care to you today.   Wyn Quaker FNP-C

## 2019-10-01 NOTE — Progress Notes (Signed)
_0  ID: Stephanie Woodward, female    DOB: May 12, 1940, 79 y.o.   MRN: [id number]  Chief Complaint  Patient presents with  . Follow-up    SOB    Referring provider: [facility], [facility]*  HPI:  79 year old former smoker followed in out office for restrictive lung disease   PMH: Obesity, hemodialysis-Tuesday, Thursday, Saturday, hypertension, pulmonary hypertension, diastolic heart failure, history of A. fib, type 2 diabetes Smoker/ Smoking History: Former smoker.  Maintenance: Spiriva Respimat Pt of: Dr. Lamonte Sakai  10/01/2019  - Visit   79 year old female former smoker previously followed in our office by Dr. Gwenette Greet.  Patient now followed by Dr. Lamonte Sakai.  Patient last had follow-up visit with him back in May/2021.  Plan at that office visit was to repeat a split-night sleep study.  Patient completed this on 08/05/2019 those results are listed below:  08/05/2019-split-night sleep study-[facility] 3  Patient still maintained on hemodialysis Tuesday/Thursday/Saturday.  She reports that they have been keeping her even.  She feels that she is at her dry weight.  She is unsure exactly what her dry weight is.  She is followed by Dr. Hollie Salk with nephrology.  Patient does not feel that the Spiriva Respimat has helped her with her shortness of breath.  She has previously been managed on Advair 250.  She found relief with this.  We will discuss and evaluate this today.  Patient with restrictive lung disease and likely mixed disease on pulmonary function testing.  Questionaires / Pulmonary Flowsheets:   ACT:  No flowsheet data found.  MMRC: mMRC Dyspnea Scale mMRC Score  10/01/2019 3    Epworth:  No flowsheet data found.  Tests:   FENO:  No results found for: NITRICOXIDE  PFT: PFT Results Latest Ref Rng & Units 11/25/2017 03/29/2014 07/05/2013  FVC-Pre L 0.97 0.80 1.18  FVC-Predicted Pre % 48 38 55  FVC-Post L 0.93 0.79 1.25  FVC-Predicted Post % 46 37 58  Pre FEV1/FVC % % 86 76 72  Post  FEV1/FCV % % 87 86 76  FEV1-Pre L 0.84 0.61 0.85  FEV1-Predicted Pre % 54 37 51  FEV1-Post L 0.81 0.68 0.95  DLCO uncorrected ml/min/mmHg 8.05 8.85 6.17  DLCO UNC% % 37 41 28  DLCO corrected ml/min/mmHg 9.13 - -  DLCO COR %Predicted % 42 - -  DLVA Predicted % 90 105 88  TLC L 3.37 - 3.44  TLC % Predicted % 71 - 72  RV % Predicted % 100 - 100    WALK:  SIX MIN WALK 07/20/2019  Supplimental Oxygen during Test? (L/min) No  Tech Comments: Patient was only able to complete 1 lap because of back pain. Patient had no complaints of SOB.    Imaging: No results found.  Lab Results:  CBC    Component Value Date/Time   WBC 12.0 (H) 06/15/2019 0815   RBC 4.10 06/15/2019 0815   HGB 11.9 (L) 06/15/2019 0939   HGB 12.2 06/15/2019 0939   HCT 35.0 (L) 06/15/2019 0939   HCT 36.0 06/15/2019 0939   PLT 268 06/15/2019 0815   MCV 94.1 06/15/2019 0815   MCH 29.8 06/15/2019 0815   MCHC 31.6 06/15/2019 0815   RDW 13.7 06/15/2019 0815   LYMPHSABS 1.7 01/24/2019 0922   MONOABS 1.0 01/24/2019 0922   EOSABS 0.1 01/24/2019 0922   BASOSABS 0.1 01/24/2019 0922    BMET    Component Value Date/Time   NA 137 06/15/2019 0939   NA 137 06/15/2019 0939  K 3.5 06/15/2019 0939   K 3.6 06/15/2019 0939   CL 93 (L) 06/15/2019 0815   CO2 32 06/15/2019 0815   GLUCOSE 164 (H) 06/15/2019 0815   BUN 24 (H) 06/15/2019 0815   CREATININE 4.20 (H) 06/15/2019 0815   CALCIUM 9.0 06/15/2019 0815   GFRNONAA 10 (L) 06/15/2019 0815   GFRAA 11 (L) 06/15/2019 0815    BNP    Component Value Date/Time   BNP 141.0 (H) 01/24/2019 0816    ProBNP    Component Value Date/Time   PROBNP 1,261.0 (H) 02/05/2014 1735    Specialty Problems      Pulmonary Problems   SOB (shortness of breath)    PFT's 03/2014:  FEV1 0.61 (37%), but ratio is normal.  +airtrapping, TLC 40% pred, DLCO 41%         Allergies  Allergen Reactions  . Eggs Or Egg-Derived Products Nausea And Vomiting  . Lisinopril Nausea And Vomiting   . Penicillins Nausea And Vomiting    Has patient had a PCN reaction causing immediate rash, facial/tongue/throat swelling, SOB or lightheadedness with hypotension: No Has patient had a PCN reaction causing severe rash involving mucus membranes or skin necrosis: No Has patient had a PCN reaction that required hospitalization: No Has patient had a PCN reaction occurring within the last 10 years: No If all of the above answers are "NO", then may proceed with Cephalosporin use.     Immunization History  Administered Date(s) Administered  . Hepatitis B, adult 03/01/2019, 03/29/2019, 04/26/2019  . Hepb-cpg 08/30/2019  . PFIZER SARS-COV-2 Vaccination 04/20/2019, 05/16/2019  . Pneumococcal Conjugate-13 02/22/2019  . Pneumococcal Polysaccharide-23 05/01/2019    Past Medical History:  Diagnosis Date  . Arthritis   . Asthma   . Atrial fibrillation ([place])   . CHF (congestive heart failure) ([place])   . CKD (chronic kidney disease), stage III   . COPD (chronic obstructive pulmonary disease) ([place])   . Diabetes mellitus    INSULIN DEPENDENT  . Gout   . Heart murmur    no issues per pt  . History of kidney stones   . Hyperlipemia   . Hypertension   . Psoriasis   . Renal disorder    congenital  . Single kidney   . Sleep apnea    doesn't use the Cpap    Tobacco History: Social History   Tobacco Use  Smoking Status Former Smoker  . Packs/day: 1.00  . Years: 20.00  . Pack years: 20.00  . Types: Cigarettes  . Quit date: 02/22/2009  . Years since quitting: 10.6  Smokeless Tobacco Never Used   Counseling given: Not Answered   Continue to not smoke  Outpatient Encounter Medications as of 10/01/2019  Medication Sig  . albuterol (PROVENTIL HFA;VENTOLIN HFA) 108 (90 BASE) MCG/ACT inhaler Inhale 2 puffs into the lungs every 4 (four) hours as needed for shortness of breath.   Marland Kitchen apixaban (ELIQUIS) 5 MG TABS tablet Take 1 tablet (5 mg total) by mouth 2 (two) times daily.  Marland Kitchen atorvastatin  (LIPITOR) 80 MG tablet Take 80 mg by mouth every evening.   . B Complex-C-Zn-Folic Acid (DIALYVITE 277-OEUM 15) 0.8 MG TABS Take 1 tablet by mouth every evening.   . B-D ULTRAFINE III SHORT PEN 31G X 8 MM MISC Inject into the skin 2 (two) times daily.  . Continuous Blood Gluc Receiver (FREESTYLE LIBRE READER) DEVI 1 kit by Does not apply route daily. Use as instructed E11.65  . Continuous Blood Gluc Sensor (  FREESTYLE LIBRE 14 DAY SENSOR) MISC 1 every 14 days E11.65  . ethyl chloride spray Apply 1 application topically Every Tuesday,Thursday,and Saturday with dialysis.   . fluticasone (CUTIVATE) 0.005 % ointment Apply 1 application topically 2 (two) times daily as needed (skin irritation/rash).   Marland Kitchen glucose blood test strip Use to check blood sugar 4 times a day. E11.65  . guaiFENesin (MUCINEX) 600 MG 12 hr tablet Take 600 mg by mouth 2 (two) times daily as needed for cough.   . hydrALAZINE (APRESOLINE) 100 MG tablet Take 50 mg by mouth 3 (three) times daily.  . insulin aspart (NOVOLOG FLEXPEN) 100 UNIT/ML FlexPen Inject 8 Units into the skin 2 (two) times daily with breakfast and lunch AND 10 Units daily with supper. (Patient taking differently: Inject 10 Units into the skin 2 (two) times daily with breakfast and lunch AND 12 Units daily with supper.)  . insulin degludec (TRESIBA FLEXTOUCH) 100 UNIT/ML SOPN FlexTouch Pen Inject 0.2 mLs (20 Units total) into the skin daily.  . isosorbide mononitrate (IMDUR) 30 MG 24 hr tablet Take 1 tablet by mouth once daily (Patient taking differently: Take 30 mg by mouth daily. )  . latanoprost (XALATAN) 0.005 % ophthalmic solution Place 1 drop into both eyes at bedtime.  . metoprolol succinate (TOPROL-XL) 25 MG 24 hr tablet Take 2 tablets (50 mg total) by mouth every morning AND 1 tablet (25 mg total) every evening.  Marland Kitchen NARCAN 4 MG/0.1ML LIQD nasal spray kit Place 1 spray into the nose See admin instructions. Call EMS at first sign of opioid overdose. Place on spray  in nose.  If no response, then place one spray ing the opposite side of the nose.  . ondansetron (ZOFRAN) 4 MG tablet Take 4 mg by mouth 2 (two) times daily as needed for nausea.   . pantoprazole (PROTONIX) 40 MG tablet Take 40 mg by mouth every evening.   . torsemide (DEMADEX) 20 MG tablet Take 40 mg by mouth daily.  . traMADol (ULTRAM) 50 MG tablet Take 50 mg by mouth every 6 (six) hours as needed for severe pain (for pain.).   Marland Kitchen triamcinolone cream (KENALOG) 0.1 % Apply 1 application topically 2 (two) times daily as needed (skin rash/irritation.).   Marland Kitchen Tiotropium Bromide Monohydrate (SPIRIVA RESPIMAT) 2.5 MCG/ACT AERS Inhale 2 puffs into the lungs daily. (Patient not taking: Reported on 10/01/2019)  . Tiotropium Bromide-Olodaterol (STIOLTO RESPIMAT) 2.5-2.5 MCG/ACT AERS Inhale 2 puffs into the lungs daily.   No facility-administered encounter medications on file as of 10/01/2019.     Review of Systems  Review of Systems  Constitutional: Positive for fatigue. Negative for activity change and fever.  HENT: Negative for sinus pressure, sinus pain and sore throat.   Respiratory: Positive for shortness of breath. Negative for cough and wheezing.   Cardiovascular: Negative for chest pain, palpitations and leg swelling.  Gastrointestinal: Negative for diarrhea, nausea and vomiting.  Musculoskeletal: Negative for arthralgias.  Neurological: Negative for dizziness.  Psychiatric/Behavioral: Negative for sleep disturbance. The patient is not nervous/anxious.      Physical Exam  BP 120/60 (BP Location: Left Arm, Cuff Size: Normal)   Pulse 72   Temp (!) 97.4 F (36.3 C) (Oral)   Ht 5' 3" (1.6 m)   Wt 198 lb (89.8 kg)   SpO2 98%   BMI 35.07 kg/m   Wt Readings from Last 5 Encounters:  10/01/19 198 lb (89.8 kg)  08/05/19 192 lb (87.1 kg)  07/20/19 192 lb 12.8 oz (  87.5 kg)  07/02/19 194 lb 9.6 oz (88.3 kg)  06/15/19 193 lb (87.5 kg)    BMI Readings from Last 5 Encounters:  10/01/19 35.07  kg/m  08/05/19 35.12 kg/m  07/20/19 35.26 kg/m  07/02/19 36.77 kg/m  06/15/19 35.30 kg/m     Physical Exam Vitals and nursing note reviewed.  Constitutional:      General: She is not in acute distress.    Appearance: Normal appearance. She is obese.  HENT:     Head: Normocephalic and atraumatic.     Right Ear: External ear normal.     Left Ear: External ear normal.     Nose: Nose normal. No congestion.     Mouth/Throat:     Mouth: Mucous membranes are moist.     Pharynx: Oropharynx is clear.  Eyes:     Pupils: Pupils are equal, round, and reactive to light.  Cardiovascular:     Rate and Rhythm: Normal rate and regular rhythm.     Pulses: Normal pulses.     Heart sounds: Normal heart sounds. No murmur heard.   Pulmonary:     Breath sounds: Normal breath sounds. No decreased air movement. No decreased breath sounds, wheezing or rales.  Musculoskeletal:     Cervical back: Normal range of motion.     Right lower leg: No edema.     Left lower leg: No edema.  Skin:    General: Skin is warm and dry.     Capillary Refill: Capillary refill takes less than 2 seconds.  Neurological:     General: No focal deficit present.     Mental Status: She is alert and oriented to person, place, and time. Mental status is at baseline.     Gait: Gait normal.  Psychiatric:        Mood and Affect: Mood normal.        Behavior: Behavior normal.        Thought Content: Thought content normal.        Judgment: Judgment normal.       Assessment & Plan:   Chronic kidney disease (CKD), stage IV (severe) ([place]) Plan: Continue Tuesday, Thursday, Saturday dialysis Continue follow-up with nephrology  SOB (shortness of breath) Plan: Stop Spiriva Respimat Start Stiolto Respimat We will reorder pulmonary function testing    Return in about 3 months (around 01/01/2020), or if symptoms worsen or fail to improve, for Follow up with Dr. Lamonte Sakai, Follow up for FULL PFT - 60 min.   Lauraine Rinne, NP 10/01/2019   This appointment required 26 minutes of patient care (this includes precharting, chart review, review of results, face-to-face care, etc.).

## 2019-10-01 NOTE — Assessment & Plan Note (Signed)
Plan: Stop Spiriva Respimat Start Stiolto Respimat We will reorder pulmonary function testing

## 2019-10-01 NOTE — Assessment & Plan Note (Signed)
Plan: Continue Tuesday, Thursday, Saturday dialysis Continue follow-up with nephrology

## 2019-10-05 DIAGNOSIS — L218 Other seborrheic dermatitis: Secondary | ICD-10-CM | POA: Diagnosis not present

## 2019-10-05 DIAGNOSIS — L280 Lichen simplex chronicus: Secondary | ICD-10-CM | POA: Diagnosis not present

## 2019-10-05 DIAGNOSIS — D2371 Other benign neoplasm of skin of right lower limb, including hip: Secondary | ICD-10-CM | POA: Diagnosis not present

## 2019-10-10 ENCOUNTER — Other Ambulatory Visit (HOSPITAL_COMMUNITY): Payer: Self-pay | Admitting: Adult Health

## 2019-10-10 DIAGNOSIS — G894 Chronic pain syndrome: Secondary | ICD-10-CM | POA: Diagnosis not present

## 2019-10-23 DIAGNOSIS — N186 End stage renal disease: Secondary | ICD-10-CM | POA: Diagnosis not present

## 2019-10-23 DIAGNOSIS — Z992 Dependence on renal dialysis: Secondary | ICD-10-CM | POA: Diagnosis not present

## 2019-10-23 DIAGNOSIS — I129 Hypertensive chronic kidney disease with stage 1 through stage 4 chronic kidney disease, or unspecified chronic kidney disease: Secondary | ICD-10-CM | POA: Diagnosis not present

## 2019-10-24 ENCOUNTER — Other Ambulatory Visit: Payer: Self-pay

## 2019-10-24 ENCOUNTER — Encounter (HOSPITAL_COMMUNITY): Payer: Self-pay

## 2019-10-24 ENCOUNTER — Encounter (HOSPITAL_COMMUNITY): Payer: Self-pay | Admitting: Cardiology

## 2019-10-24 ENCOUNTER — Ambulatory Visit (HOSPITAL_BASED_OUTPATIENT_CLINIC_OR_DEPARTMENT_OTHER)
Admission: RE | Admit: 2019-10-24 | Discharge: 2019-10-24 | Disposition: A | Discharge status: Home / Self Care | Payer: HMO | Source: Ambulatory Visit | Attending: Cardiology | Admitting: Cardiology

## 2019-10-24 ENCOUNTER — Emergency Department (HOSPITAL_COMMUNITY)
Admission: EM | Admit: 2019-10-24 | Discharge: 2019-10-24 | Disposition: A | Discharge status: Home / Self Care | Payer: HMO | Attending: Emergency Medicine | Admitting: Emergency Medicine

## 2019-10-24 ENCOUNTER — Emergency Department (HOSPITAL_COMMUNITY): Payer: HMO

## 2019-10-24 VITALS — BP 142/74 | HR 92 | Ht 62.0 in | Wt 198.0 lb

## 2019-10-24 DIAGNOSIS — Z992 Dependence on renal dialysis: Secondary | ICD-10-CM | POA: Insufficient documentation

## 2019-10-24 DIAGNOSIS — I132 Hypertensive heart and chronic kidney disease with heart failure and with stage 5 chronic kidney disease, or end stage renal disease: Secondary | ICD-10-CM | POA: Insufficient documentation

## 2019-10-24 DIAGNOSIS — R002 Palpitations: Secondary | ICD-10-CM | POA: Diagnosis present

## 2019-10-24 DIAGNOSIS — Z794 Long term (current) use of insulin: Secondary | ICD-10-CM | POA: Diagnosis not present

## 2019-10-24 DIAGNOSIS — N184 Chronic kidney disease, stage 4 (severe): Secondary | ICD-10-CM | POA: Insufficient documentation

## 2019-10-24 DIAGNOSIS — I5032 Chronic diastolic (congestive) heart failure: Secondary | ICD-10-CM

## 2019-10-24 DIAGNOSIS — N186 End stage renal disease: Secondary | ICD-10-CM | POA: Insufficient documentation

## 2019-10-24 DIAGNOSIS — J45909 Unspecified asthma, uncomplicated: Secondary | ICD-10-CM | POA: Diagnosis not present

## 2019-10-24 DIAGNOSIS — R079 Chest pain, unspecified: Secondary | ICD-10-CM | POA: Diagnosis not present

## 2019-10-24 DIAGNOSIS — I517 Cardiomegaly: Secondary | ICD-10-CM | POA: Diagnosis not present

## 2019-10-24 DIAGNOSIS — Z87891 Personal history of nicotine dependence: Secondary | ICD-10-CM | POA: Diagnosis not present

## 2019-10-24 DIAGNOSIS — E1165 Type 2 diabetes mellitus with hyperglycemia: Secondary | ICD-10-CM | POA: Insufficient documentation

## 2019-10-24 DIAGNOSIS — R0609 Other forms of dyspnea: Secondary | ICD-10-CM

## 2019-10-24 DIAGNOSIS — I482 Chronic atrial fibrillation, unspecified: Secondary | ICD-10-CM

## 2019-10-24 DIAGNOSIS — E785 Hyperlipidemia, unspecified: Secondary | ICD-10-CM | POA: Diagnosis not present

## 2019-10-24 DIAGNOSIS — N1330 Unspecified hydronephrosis: Secondary | ICD-10-CM | POA: Insufficient documentation

## 2019-10-24 DIAGNOSIS — J449 Chronic obstructive pulmonary disease, unspecified: Secondary | ICD-10-CM | POA: Insufficient documentation

## 2019-10-24 DIAGNOSIS — I272 Pulmonary hypertension, unspecified: Secondary | ICD-10-CM | POA: Insufficient documentation

## 2019-10-24 DIAGNOSIS — E1122 Type 2 diabetes mellitus with diabetic chronic kidney disease: Secondary | ICD-10-CM | POA: Diagnosis not present

## 2019-10-24 DIAGNOSIS — Z7901 Long term (current) use of anticoagulants: Secondary | ICD-10-CM | POA: Diagnosis not present

## 2019-10-24 DIAGNOSIS — I480 Paroxysmal atrial fibrillation: Secondary | ICD-10-CM

## 2019-10-24 DIAGNOSIS — I5022 Chronic systolic (congestive) heart failure: Secondary | ICD-10-CM | POA: Diagnosis not present

## 2019-10-24 DIAGNOSIS — E11649 Type 2 diabetes mellitus with hypoglycemia without coma: Secondary | ICD-10-CM | POA: Diagnosis not present

## 2019-10-24 DIAGNOSIS — Z79899 Other long term (current) drug therapy: Secondary | ICD-10-CM | POA: Diagnosis not present

## 2019-10-24 DIAGNOSIS — I4891 Unspecified atrial fibrillation: Secondary | ICD-10-CM | POA: Insufficient documentation

## 2019-10-24 DIAGNOSIS — R0600 Dyspnea, unspecified: Secondary | ICD-10-CM | POA: Diagnosis not present

## 2019-10-24 DIAGNOSIS — I130 Hypertensive heart and chronic kidney disease with heart failure and stage 1 through stage 4 chronic kidney disease, or unspecified chronic kidney disease: Secondary | ICD-10-CM | POA: Insufficient documentation

## 2019-10-24 DIAGNOSIS — N185 Chronic kidney disease, stage 5: Secondary | ICD-10-CM | POA: Diagnosis not present

## 2019-10-24 DIAGNOSIS — Z7951 Long term (current) use of inhaled steroids: Secondary | ICD-10-CM | POA: Diagnosis not present

## 2019-10-24 LAB — TROPONIN I (HIGH SENSITIVITY)
Troponin I (High Sensitivity): 41 ng/L — ABNORMAL HIGH (ref <18)
Troponin I (High Sensitivity): 43 ng/L — ABNORMAL HIGH (ref <18)

## 2019-10-24 LAB — CBC
HCT: 34.9 % — ABNORMAL LOW (ref 36.0–46.0)
HCT: 35.2 % — ABNORMAL LOW (ref 36.0–46.0)
Hemoglobin: 11.2 g/dL — ABNORMAL LOW (ref 12.0–15.0)
Hemoglobin: 11.2 g/dL — ABNORMAL LOW (ref 12.0–15.0)
MCH: 31.3 pg (ref 26.0–34.0)
MCH: 31.4 pg (ref 26.0–34.0)
MCHC: 32.1 g/dL (ref 30.0–36.0)
MCHC: 31.8 g/dL (ref 30.0–36.0)
MCV: 97.5 fL (ref 80.0–100.0)
MCV: 98.6 fL (ref 80.0–100.0)
Platelets: 286 10*3/uL (ref 150–400)
Platelets: 276 10*3/uL (ref 150–400)
RBC: 3.58 MIL/uL — ABNORMAL LOW (ref 3.87–5.11)
RBC: 3.57 MIL/uL — ABNORMAL LOW (ref 3.87–5.11)
RDW: 13.4 % (ref 11.5–15.5)
RDW: 13.4 % (ref 11.5–15.5)
WBC: 11.7 10*3/uL — ABNORMAL HIGH (ref 4.0–10.5)
WBC: 12.3 10*3/uL — ABNORMAL HIGH (ref 4.0–10.5)
nRBC: 0 % (ref 0.0–0.2)
nRBC: 0 % (ref 0.0–0.2)

## 2019-10-24 LAB — BASIC METABOLIC PANEL
Anion gap: 14 (ref 5–15)
BUN: 31 mg/dL — ABNORMAL HIGH (ref 8–23)
CO2: 28 mmol/L (ref 22–32)
Calcium: 9.1 mg/dL (ref 8.9–10.3)
Chloride: 97 mmol/L — ABNORMAL LOW (ref 98–111)
Creatinine, Ser: 5.26 mg/dL — ABNORMAL HIGH (ref 0.44–1.00)
GFR calc Af Amer: 9 mL/min — ABNORMAL LOW (ref >60)
GFR calc non Af Amer: 7 mL/min — ABNORMAL LOW (ref >60)
Glucose, Bld: 329 mg/dL — ABNORMAL HIGH (ref 70–99)
Potassium: 4 mmol/L (ref 3.5–5.1)
Sodium: 139 mmol/L (ref 135–145)

## 2019-10-24 LAB — LIPID PANEL
Cholesterol: 134 mg/dL (ref 0–200)
HDL: 53 mg/dL (ref >40)
LDL Cholesterol: 64 mg/dL (ref 0–99)
Total CHOL/HDL Ratio: 2.5 RATIO
Triglycerides: 85 mg/dL (ref <150)
VLDL: 17 mg/dL (ref 0–40)

## 2019-10-24 LAB — MAGNESIUM: Magnesium: 2.1 mg/dL (ref 1.7–2.4)

## 2019-10-24 LAB — CBG MONITORING, ED: Glucose-Capillary: 247 mg/dL — ABNORMAL HIGH (ref 70–99)

## 2019-10-24 LAB — BRAIN NATRIURETIC PEPTIDE: B Natriuretic Peptide: 257.9 pg/mL — ABNORMAL HIGH (ref 0.0–100.0)

## 2019-10-24 MED ORDER — ETOMIDATE 2 MG/ML IV SOLN
0.1000 mg/kg | Freq: Once | INTRAVENOUS | Status: AC
  Administered 2019-10-24: 9 mg via INTRAVENOUS
  Filled 2019-10-24: qty 10

## 2019-10-24 MED ORDER — ETOMIDATE 2 MG/ML IV SOLN
INTRAVENOUS | Status: AC | PRN
  Administered 2019-10-24: 9 mg via INTRAVENOUS

## 2019-10-24 NOTE — ED Provider Notes (Signed)
[facility] EMERGENCY DEPARTMENT Provider Note   CSN: [id number] Arrival date & time: 10/24/19  1542     History Chief Complaint  Patient presents with  . Atrial Fibrillation    Stephanie Woodward is a 79 y.o. 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  Patient called EMS earlier today after experiencing onset of chest pain and heart fluttering.  She reports that she had a cardiology appointment this morning and was asymptomatic at that time, had an EKG and told that everything was normal.  She returned home and within a few minutes of arriving home, began having a sensation of pressure and shortness of breath.  The pressure sensation was in her mid sternum.  She never experienced it before.  She thought it would go away after a few minutes but when it did not she called 911.    EMS reports the patient was found to be in A. fib with a heart rate ranging 140s - 180 bpm.  EMS gave the patient 2 doses of diltiazem (10 mg at 1518 and 10 mg at 1524) with subsequent rate improvement to about 120 bpm.  Upon arrival in the emergency department she feels asymptomatic now and back to normal.  She reports he does take metoprolol daily has not missed any doses.  She denies any alcohol or drug use.  She has not missed any of her scheduled dialysis.  Cardiologist is Dr Loralie Champagne  Last echo on 03/05/19:  IMPRESSIONS   1. Left ventricular ejection fraction, by visual estimation, is 55 to  60%. The left ventricle has normal function. There is no left ventricular  hypertrophy.  2. The left ventricle has no regional wall motion abnormalities.  3. Abnormal septal motion consistent with left bundle branch block.  4. Left ventricular diastolic parameters are consistent with Grade II  diastolic dysfunction (pseudonormalization).    5. Elevated left ventricular end-diastolic pressure.  6. Global right ventricle has normal systolic function.The right  ventricular size is normal. No increase in right ventricular wall  thickness.  7. Left atrial size was mildly dilated.  8. Right atrial size was mildly dilated.  9. Small pericardial effusion.  10. The pericardial effusion is circumferential.  11. Mild mitral annular calcification.  12. The mitral valve is normal in structure. Trivial mitral valve  regurgitation.  13. The tricuspid valve is normal in structure.  14. The aortic valve is tricuspid. Aortic valve regurgitation is not  visualized.  15. The pulmonic valve was grossly normal. Pulmonic valve regurgitation is trivial.  16. The inferior vena cava is normal in size with greater than 50%  respiratory variability, suggesting right atrial pressure of 3 mmHg.  17. Cannot exclude ASD/PFO.   Last LHC on April 2021, Impression:  1. Normal filling pressures.  2. Mild pulmonary hypertension.  3. Preserved cardiac output.  4. Unusual coronary tree but no obstructive disease noted.  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    HPI     Past Medical History:  Diagnosis Date  . Arthritis   . Asthma   . Atrial fibrillation ([place])   . CHF (congestive heart failure) ([place])   . CKD (chronic kidney disease), stage III   . COPD (chronic obstructive pulmonary disease) ([place])   . Diabetes mellitus  INSULIN DEPENDENT  . Gout   . Heart murmur    no issues per pt  . History of kidney stones   . Hyperlipemia   . Hypertension   . Psoriasis   . Renal disorder    congenital  . Single kidney   . Sleep apnea    doesn't use the Cpap    Patient Active Problem List   Diagnosis Date Noted  . Type 2 diabetes mellitus with hyperglycemia, with long-term current use of insulin ([place]) 02/02/2019  . 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 Type 2 diabetes mellitus with stage 4 chronic kidney disease, with long-term current use of insulin ([place]) 02/02/2019  . Acute renal failure superimposed on chronic kidney disease ([place]) 01/24/2019  . Leukocytosis 01/24/2019  . Hypokalemia 01/24/2019  . Atrial fibrillation, chronic ([place]) 01/24/2019  . Chronic anticoagulation 01/24/2019  . Chronic kidney disease (CKD), stage IV (severe) ([place]) 10/31/2018  . Atrial fibrillation with RVR ([place]) 04/08/2016  . Chest pain 04/04/2016  . Acute on chronic renal failure ([place]) 03/09/2016  . Diabetes mellitus with complication ([place]) 33/82/5053  . Foot pain, bilateral 03/09/2016  . Renal disorder   . Bradycardia   . CHF (congestive heart failure) ([place]) 02/06/2014  . Cellulitis of right lower extremity 02/06/2014  . Essential hypertension 02/06/2014  . Hyperlipidemia 02/06/2014  . Uncontrolled type 2 diabetes mellitus with hypoglycemia ([place]) 02/06/2014  . Cellulitis 02/06/2014  . Hypoxemia 12/07/2013  . Type 2 diabetes mellitus, controlled, with renal complications ([place]) 97/67/3419  . Kidney disease, chronic, stage III (GFR 30-59 ml/min) ([place]) 12/07/2013  . Pulmonary hypertension ([place]) 12/07/2013  . Chronic systolic congestive heart failure ([place])   . SOB (shortness of breath) 12/05/2013  . Gout 11/11/2009  . Obesity 11/11/2009  . Hypertensive heart disease     Past Surgical History:  Procedure Laterality Date  . ABDOMINAL HYSTERECTOMY    . AV FISTULA PLACEMENT Left 11/17/2018   Procedure: BRACHIO-CEPHALIC ARTERIOVENOUS (AV) FISTULA CREATION IN LEFT ARM;  Surgeon: Marty Heck, MD;  Location: [facility];  Service: Vascular;  Laterality: Left;  . CHOLECYSTECTOMY    . INSERTION OF DIALYSIS CATHETER Right 01/26/2019   Procedure: INSERTION OF DIALYSIS CATHETER, right internal jugular;  Surgeon: Angelia Mould, MD;  Location: [facility];  Service: Vascular;  Laterality: Right;  . LEFT AND RIGHT HEART CATHETERIZATION WITH CORONARY  ANGIOGRAM N/A 11/29/2013   Procedure: LEFT AND RIGHT HEART CATHETERIZATION WITH CORONARY ANGIOGRAM;  Surgeon: Jacolyn Reedy, MD;  Location: [facility] CATH LAB;  Service: Cardiovascular;  Laterality: N/A;  . RIGHT HEART CATH N/A 11/23/2017   Procedure: RIGHT HEART CATH;  Surgeon: Larey Dresser, MD;  Location: [facility] CV LAB;  Service: Cardiovascular;  Laterality: N/A;  . RIGHT/LEFT HEART CATH AND CORONARY ANGIOGRAPHY N/A 06/15/2019   Procedure: RIGHT/LEFT HEART CATH AND CORONARY ANGIOGRAPHY;  Surgeon: Larey Dresser, MD;  Location: [facility] CV LAB;  Service: Cardiovascular;  Laterality: N/A;     OB History   No obstetric history on file.     Family History  Problem Relation Age of Onset  . Lupus Daughter   . Cancer Mother 56  . CVA Father 50  . Cancer Brother 105    Social History   Tobacco Use  . Smoking status: Former Smoker    Packs/day: 1.00    Years: 20.00    Pack years: 20.00    Types: Cigarettes    Quit date: 02/22/2009  Years since quitting: 10.6  . Smokeless tobacco: Never Used  Vaping Use  . Vaping Use: Never used  Substance Use Topics  . Alcohol use: Yes    Alcohol/week: 0.0 standard drinks    Comment: occasional beer  . Drug use: Not Currently    Comment: marijuana in the past    Home Medications Prior to Admission medications   Medication Sig Start Date End Date Taking? Authorizing Provider  albuterol (PROVENTIL HFA;VENTOLIN HFA) 108 (90 BASE) MCG/ACT inhaler Inhale 2 puffs into the lungs every 4 (four) hours as needed for shortness of breath.     [provider]  apixaban (ELIQUIS) 5 MG TABS tablet Take 1 tablet (5 mg total) by mouth 2 (two) times daily. 06/11/19   Larey Dresser, MD  atorvastatin (LIPITOR) 80 MG tablet Take 80 mg by mouth every evening.     [provider]  B Complex-C-Zn-Folic Acid (DIALYVITE 034-JZPH 15) 0.8 MG TABS Take 1 tablet by mouth every evening.  03/07/19   [provider]  B-D ULTRAFINE III SHORT  PEN 31G X 8 MM MISC Inject into the skin 2 (two) times daily. 09/21/19   [provider]  betamethasone dipropionate 0.05 % cream Apply topically. 10/05/19   [provider]  Continuous Blood Gluc Receiver (FREESTYLE LIBRE READER) DEVI 1 kit by Does not apply route daily. Use as instructed E11.65 09/12/19   Shamleffer, Melanie Crazier, MD  Continuous Blood Gluc Sensor (FREESTYLE LIBRE 14 DAY SENSOR) MISC 1 every 14 days E11.65 09/12/19   Shamleffer, Melanie Crazier, MD  ethyl chloride spray Apply 1 application topically Every Tuesday,Thursday,and Saturday with dialysis.  04/04/19   [provider]  fluticasone (CUTIVATE) 0.005 % ointment Apply 1 application topically 2 (two) times daily as needed (skin irritation/rash).  10/29/17   [provider]  glucose blood test strip Use to check blood sugar 4 times a day. E11.65 06/11/19   Shamleffer, Melanie Crazier, MD  hydrALAZINE (APRESOLINE) 100 MG tablet Take 50 mg by mouth 3 (three) times daily.    [provider]  insulin aspart (NOVOLOG FLEXPEN) 100 UNIT/ML FlexPen Inject 8 Units into the skin 2 (two) times daily with breakfast and lunch AND 10 Units daily with supper. Patient taking differently: Inject 10 Units into the skin 2 (two) times daily with breakfast and lunch AND 12 Units daily with supper. 04/04/19   Shamleffer, Melanie Crazier, MD  insulin degludec (TRESIBA FLEXTOUCH) 100 UNIT/ML SOPN FlexTouch Pen Inject 0.2 mLs (20 Units total) into the skin daily. 04/04/19   Shamleffer, Melanie Crazier, MD  isosorbide mononitrate (IMDUR) 30 MG 24 hr tablet Take 1 tablet by mouth once daily Patient taking differently: Take 30 mg by mouth daily.  05/07/19   Larey Dresser, MD  ketoconazole (NIZORAL) 2 % cream Apply topically. 10/05/19   [provider]  latanoprost (XALATAN) 0.005 % ophthalmic solution Place 1 drop into both eyes at bedtime.    [provider]  metoprolol succinate (TOPROL-XL) 25 MG 24  hr tablet Take 2 tablets (50 mg total) by mouth every morning AND 1 tablet (25 mg total) every evening. 07/25/19   Larey Dresser, MD  NARCAN 4 MG/0.1ML LIQD nasal spray kit Place 1 spray into the nose See admin instructions. Call EMS at first sign of opioid overdose. Place on spray in nose.  If no response, then place one spray ing the opposite side of the nose. 01/02/19   [provider]  ondansetron (ZOFRAN) 4  MG tablet Take 4 mg by mouth 2 (two) times daily as needed for nausea.  10/26/18   [provider]  pantoprazole (PROTONIX) 40 MG tablet Take 40 mg by mouth every evening.  10/26/18   [provider]  Tiotropium Bromide Monohydrate (SPIRIVA RESPIMAT) 2.5 MCG/ACT AERS Inhale 2 puffs into the lungs daily. 07/20/19   Collene Gobble, MD  Tiotropium Bromide-Olodaterol (STIOLTO RESPIMAT) 2.5-2.5 MCG/ACT AERS Inhale 2 puffs into the lungs daily. 10/01/19   Lauraine Rinne, NP  torsemide (DEMADEX) 20 MG tablet TAKE 4 TABLETS BY MOUTH THREE TIMES DAILY **TAKE  AN  ADDITIONAL  2  TABLETS  AS  NEEDED** 10/11/19   Larey Dresser, MD  traMADol (ULTRAM) 50 MG tablet Take 50 mg by mouth every 6 (six) hours as needed for severe pain (for pain.).     [provider]  triamcinolone cream (KENALOG) 0.1 % Apply 1 application topically 2 (two) times daily as needed (skin rash/irritation.).     [provider]    Allergies    Eggs or egg-derived products, Lisinopril, and Penicillins  Review of Systems   Review of Systems  Constitutional: Negative for chills and fever.  HENT: Negative for ear pain and sore throat.   Eyes: Negative for pain and visual disturbance.  Respiratory: Positive for shortness of breath. Negative for cough.   Cardiovascular: Positive for chest pain and palpitations. Negative for leg swelling.  Gastrointestinal: Negative for abdominal pain, nausea and vomiting.  Genitourinary: Negative for dysuria and hematuria.  Musculoskeletal: Negative for  arthralgias and back pain.  Skin: Negative for color change and rash.  Neurological: Negative for syncope, light-headedness and headaches.  Psychiatric/Behavioral: Negative for agitation and confusion.  All other systems reviewed and are negative.   Physical Exam Updated Vital Signs BP 120/60   Pulse (!) 58   Temp 99.1 F (37.3 C) (Oral)   Resp 17   Ht '5\' 2"'  (1.575 m)   Wt 90 kg   SpO2 100%   BMI 36.29 kg/m   Physical Exam Vitals and nursing note reviewed.  Constitutional:      General: She is not in acute distress.    Appearance: She is well-developed. She is obese.  HENT:     Head: Normocephalic and atraumatic.  Eyes:     Conjunctiva/sclera: Conjunctivae normal.  Cardiovascular:     Rate and Rhythm: Tachycardia present. Rhythm irregular.     Pulses: Normal pulses.  Pulmonary:     Effort: Pulmonary effort is normal. No respiratory distress.     Breath sounds: Normal breath sounds.  Abdominal:     General: There is no distension.     Palpations: Abdomen is soft.     Tenderness: There is no abdominal tenderness.  Musculoskeletal:     Cervical back: Neck supple.  Skin:    General: Skin is warm and dry.  Neurological:     General: No focal deficit present.     Mental Status: She is alert and oriented to person, place, and time.  Psychiatric:        Mood and Affect: Mood normal.        Behavior: Behavior normal.     ED Results / Procedures / Treatments   Labs (all labs ordered are listed, but only abnormal results are displayed) Labs Reviewed  BASIC METABOLIC PANEL - Abnormal; Notable for the following components:      Result Value   Chloride 97 (*)    Glucose, Bld 329 (*)  BUN 31 (*)    Creatinine, Ser 5.26 (*)    GFR calc non Af Amer 7 (*)    GFR calc Af Amer 9 (*)    All other components within normal limits  CBC - Abnormal; Notable for the following components:   WBC 12.3 (*)    RBC 3.57 (*)    Hemoglobin 11.2 (*)    HCT 35.2 (*)    All other  components within normal limits  BRAIN NATRIURETIC PEPTIDE - Abnormal; Notable for the following components:   B Natriuretic Peptide 257.9 (*)    All other components within normal limits  CBG MONITORING, ED - Abnormal; Notable for the following components:   Glucose-Capillary 247 (*)    All other components within normal limits  TROPONIN I (HIGH SENSITIVITY) - Abnormal; Notable for the following components:   Troponin I (High Sensitivity) 41 (*)    All other components within normal limits  TROPONIN I (HIGH SENSITIVITY) - Abnormal; Notable for the following components:   Troponin I (High Sensitivity) 43 (*)    All other components within normal limits  MAGNESIUM    EKG EKG Interpretation  Date/Time:  Wednesday October 24 2019 15:53:01 EDT Ventricular Rate:  137 PR Interval:    QRS Duration: 106 QT Interval:  340 QTC Calculation: 513 R Axis:   -74 Text Interpretation: Atrial fibrillation with rapid ventricular response Left axis deviation Low voltage QRS Possible Anterolateral infarct , age undetermined Abnormal ECG LBBB pattern seen on Sept 2021 ECG, No STEMI Confirmed by Octaviano Glow [phone]) on 10/24/2019 5:01:25 PM   Radiology DG Chest 2 View  Result Date: 10/24/2019 CLINICAL DATA:  Atrial fibrillation and chest pain EXAM: CHEST - 2 VIEW COMPARISON:  Radiograph 07/20/2019 FINDINGS: There are increased interstitial markings with central vascular congestion, vascular cephalization, and septal thickening. Stable area of bandlike scarring in the left mid lung. No pneumothorax or effusion. Stable cardiomegaly with a calcified aorta. No acute osseous or soft tissue abnormality. Degenerative changes are present in the imaged spine and shoulders. IMPRESSION: Features suggesting CHF with interstitial edema and cardiomegaly. Stable scarring left mid lung. Electronically Signed   By: Lovena Le M.D.   On: 10/24/2019 16:31    Procedures .Cardioversion  Date/Time: 10/25/2019 12:41  AM Performed by: Wyvonnia Dusky, MD Authorized by: Wyvonnia Dusky, MD   Consent:    Consent obtained:  Verbal   Consent given by:  Patient   Risks discussed:  Cutaneous burn, induced arrhythmia and death   Alternatives discussed:  Rate-control medication Pre-procedure details:    Cardioversion basis:  Elective   Rhythm:  Atrial fibrillation   Electrode placement:  Anterior-posterior Patient sedated: Yes. Refer to sedation procedure documentation for details of sedation.  Attempt one:    Cardioversion mode:  Synchronous   Waveform:  Biphasic   Shock (Joules):  150   Shock outcome:  Conversion to normal sinus rhythm Post-procedure details:    Patient status:  Awake   Patient tolerance of procedure:  Tolerated well, no immediate complications .Sedation  Date/Time: 10/25/2019 12:41 AM Performed by: Wyvonnia Dusky, MD Authorized by: Wyvonnia Dusky, MD   Consent:    Consent obtained:  Verbal   Consent given by:  Patient   Risks discussed:  Allergic reaction, dysrhythmia, inadequate sedation, vomiting, nausea, respiratory compromise necessitating ventilatory assistance and intubation, prolonged sedation necessitating reversal and prolonged hypoxia resulting in organ damage Universal protocol:    Procedure explained and questions answered to patient or  proxy's satisfaction: yes     Relevant documents present and verified: yes     Test results available and properly labeled: yes     Imaging studies available: yes     Required blood products, implants, devices, and special equipment available: yes     Site/side marked: yes     Immediately prior to procedure a time out was called: yes     Patient identity confirmation method:  Arm band and verbally with patient Indications:    Procedure performed:  Cardioversion Pre-sedation assessment:    Time since last food or drink:  8 hours   ASA classification: class 3 - patient with severe systemic disease     Neck mobility:  normal     Mouth opening:  2 finger widths   Mallampati score:  III - soft palate, base of uvula visible   Pre-sedation assessments completed and reviewed: airway patency, cardiovascular function, hydration status, mental status, nausea/vomiting, pain level, respiratory function and temperature   Immediate pre-procedure details:    Reassessment: Patient reassessed immediately prior to procedure     Reviewed: vital signs   Procedure details (see MAR for exact dosages):    Preoxygenation:  Nasal cannula   Sedation:  Etomidate   Intended level of sedation: deep   Analgesia:  None   Intra-procedure monitoring:  Blood pressure monitoring, cardiac monitor, continuous capnometry, continuous pulse oximetry, frequent LOC assessments and frequent vital sign checks   Intra-procedure events: none     Intra-procedure management:  Supplemental oxygen   Total Provider sedation time (minutes):  40 Post-procedure details:    Attendance: Constant attendance by certified staff until patient recovered     Recovery: Patient returned to pre-procedure baseline     Post-sedation assessments completed and reviewed: airway patency, cardiovascular function, hydration status, mental status, nausea/vomiting, pain level, respiratory function and temperature     Patient is stable for discharge or admission: yes     Patient tolerance:  Tolerated well, no immediate complications   (including critical care time)  Medications Ordered in ED Medications  etomidate (AMIDATE) injection 9 mg (9 mg Intravenous Given 10/24/19 1944)  etomidate (AMIDATE) injection (9 mg Intravenous Given 10/24/19 1944)    ED Course  I have reviewed the triage vital signs and the nursing notes.  Pertinent labs & imaging results that were available during my care of the patient were reviewed by me and considered in my medical decision making (see chart for details).  79 yo female presenting to ED with A Fib with RVR, now rate improved after  diltiazem x 2 given by EMS  (20 mg total) en route.    Onset of symptoms was this afternoon.  She has known paroxysmal A Fib and has been compliant with all her medications including her metoprolol and eliquis.  She remains in A Fib with HR 100-130 bpm in the ED.  She has had a recent cardiac workup in the past year including a LHC 5 months ago in April which did not show any significant CAD, and an echo in January which showed preserved EF.  Doubtful of ACS at this time.  We'll check her electrolyte levels, reassess her HR, and I'll discuss cardioversion vs rate control with cardiology.  *  I personally reviewed her labs, CXR, ECG, and prior medical records as noted.  Clinical Course as of Oct 24 41  Wed Oct 24, 2019  1717 DG chest per my interpretation shows some evidence of vascular congestion but overall similar  in appearance to Jul 20 2019 xray.  No focal consolidations, large pleural effusions, or evidence of PTX seen.   [MT]  1805 HR back to 100-130 bpm and irregular.  BP stable.  Patient remains asymptomatic.   [MT]  4445 Wrong heart group initially paged, I've paged [facility] now   [MT]  1855 GFR, Est Non African American(!): 7 [MT]  1920 I spoke with Dr Angelena Form from cardiology who agrees that cardioversion is reasonable given that the patient has been compliant with eliquis.  I offered the patient (and her daughter) the options of cardioversion with conscious sedation in the ED vs rate control and monitoring in the hospital or at home.  I discussed the risks and benefits of each procedure, including the risks of anesthesia, the risk of recurrent rapid A. fib, the risk of need for repeat procedures.  I was able to answer all of their questions to their satisfaction.  The patient has elected for cardioversion.  Her last food and drink was approximately 8 hours ago.  She has no prior history of complications with anesthesia.  She does have egg based allergies, and therefore we will avoid  propofol and use etomidate.  I will dose this at 0.1 mg/kg.  I confirmed with our ED pharmacist that this dosing is still appropriate with her renal function.   [MT]  1934 Repeat trop unchanged.   [MT]  1956 Successful cardioversion into NSR, pt awake and verbalizing, monitoring patient for complete recovery from anesthesia.   [MT]  2045 Mentation back to baseline, remains in NSR with HR 60   [MT]  2113 Pt back to baseline mental state, daughter planning to take her home and stay with her tonight.  Okay for discharge.   [MT]    Clinical Course User Index [MT] Sophia Cubero, Carola Rhine, MD    Final Clinical Impression(s) / ED Diagnoses Final diagnoses:  Atrial fibrillation with RVR [facility])    Rx / DC Orders ED Discharge Orders    None       Wyvonnia Dusky, MD 10/25/19 [id number]

## 2019-10-24 NOTE — Discharge Instructions (Addendum)
You can go to dialysis as scheduled tomorrow.  Please restart taking your normal medicines when you get home tonight.

## 2019-10-24 NOTE — ED Triage Notes (Signed)
Pt BIB [facility] for eval of afib/chest pain. Pt called out EMS for chest pain, found to be in Afib w/ rate of 140-180. Rec'd 20mg  of diltiazem by EMS (10mg  @1518 , additional 10mg  @ 1524). Rate improves to 120-140 on arrival. Pt denies nausea, palpitations.

## 2019-10-24 NOTE — Progress Notes (Signed)
Advanced Heart Failure Clinic Note  PCP: Alroy Dust, L.Marlou Sa, MD HF Cardiology: Dr. Aundra Dubin  79 y.o.with history of ESRD, paroxysmal atrial fibrillation, and chronic diastolic CHF was referred by Dr. Wynonia Lawman for evaluation of CHF.  RHC/LHC was done in 10/15.  This showed markedly elevated filling pressures and pulmonary venous hypertension.  There was no significant CAD.  Last echo in 2/18 showed EF 50-55%, mild LVH, grade 2 diastolic dysfunction. She is followed by Dr. Moshe Cipro for nephrology.   Admitted 10/2-10/6/19 with SOB. RHC showed elevated filling pressures and preserved CO. She was diuresed with IV lasix, then transitioned to torsemide 80 mg am, 40 mg pm. Repeat echo showed EF 45-50% with normal RV. PYP scan was negative for TTR amyloid. PFTs showed restrictive disease. CT chest showed no ILD but did show right hydronephrosis. Renal US showed single kidney with mild to moderate hydronephrosis on right (unchanged since 2011).  DC weight 208 lbs.    In 12/20, she progressed to ESRD and was started on HD.    Echo in 1/21 showed EF 55-60% with normal.  RHC/LHC in 4/21 showed no obstructive CAD, normal filling pressures, mild pulmonary hypertension.   She returns for followup of diastolic CHF and atrial fibrillation.  She is still on torsemide but makes very little urine.  She is tolerating HD without problems.  She is still short of breath after walking about 50 feet.  No chest pain.  No orthopnea/PND.  No lightheadedness.    ECG (personally reviewed): NSR, IVCD 120 msec  Labs (2/18): K 4, creatinine 1.79 Labs (10/19): K 4.5, creatinine 2.4 Labs (12/19): LDL 62 Labs (1/20): K 3.9, creatinine 2.72 Labs (12/20): hgb 11.9  PMH: 1. Atrial fibrillation: Paroxysmal.  2. HTN 3. 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  5. ESRD 6. H/o IVCD 7. COPD: PFTs (10/19) actually showed severe restriction, concern for interstitial process.  CT chest (10/19) showed mild-moderate  patchy air trapping but no evidence for interstitial lung disease.  8. Gout 9. Chronic primarily diastolic CHF: Echo (8/26) with EF 50-55%, mild LVH, grade 2 diastolic dysfunction.  - LHC/RHC (10/15): small distal LAD but no discrete stenosis.  Mean RA 15, PA 55/20 mean 34, mean PCWP 31 => pulmonary venous hypertension.  - PYP scan (10/19): No evidence for TTR amyloidosis.  - Echo (10/19): EF 45-50%, mild LVH, normal RV size and systolic function.  - RHC (10/19): mean RA 13, PA 74/27 mean 50, mean PCWP 28, CI 3.12, PVR 3.6 WU. Pulmonary venous hypertension.  - Echo (1/21): EF 55-60%, normal RV.  - LHC/RHC (4/21): super-dominant LCx with small LAD and RCA, no CAD; mean RA 5, PA 47/15, mean PCWP 10, CI 3.61, PVR 2.5 WU.  10. Negative sleep study in 6/21.   Review of systems complete and found to be negative unless listed in HPI.    Social History   Socioeconomic History  . Marital status: Widowed    Spouse name: Not on file  . Number of children: 2  . Years of education: Not on file  . Highest education level: Not on file  Occupational History  . Occupation: retired  Tobacco Use  . Smoking status: Former Smoker    Packs/day: 1.00    Years: 20.00    Pack years: 20.00    Types: Cigarettes    Quit date: 02/22/2009    Years since quitting: 10.6  . Smokeless tobacco: Never Used  Vaping Use  . Vaping Use: Never used  Substance and  Sexual Activity  . Alcohol use: Yes    Alcohol/week: 0.0 standard drinks    Comment: occasional beer  . Drug use: Not Currently    Comment: marijuana in the past  . Sexual activity: Never  Other Topics Concern  . Not on file  Social History Narrative  . Not on file   Social Determinants of Health   Financial Resource Strain:   . Difficulty of Paying Living Expenses: Not on file  Food Insecurity:   . Worried About Charity fundraiser in the Last Year: Not on file  . Ran Out of Food in the Last Year: Not on file  Transportation Needs:   . Lack of  Transportation (Medical): Not on file  . Lack of Transportation (Non-Medical): Not on file  Physical Activity:   . Days of Exercise per Week: Not on file  . Minutes of Exercise per Session: Not on file  Stress:   . Feeling of Stress : Not on file  Social Connections:   . Frequency of Communication with Friends and Family: Not on file  . Frequency of Social Gatherings with Friends and Family: Not on file  . Attends Religious Services: Not on file  . Active Member of Clubs or Organizations: Not on file  . Attends Archivist Meetings: Not on file  . Marital Status: Not on file  Intimate Partner Violence:   . Fear of Current or Ex-Partner: Not on file  . Emotionally Abused: Not on file  . Physically Abused: Not on file  . Sexually Abused: Not on file   Family History  Problem Relation Age of Onset  . Lupus Daughter   . Cancer Mother 29  . CVA Father 24  . Cancer Brother 45   Review of systems complete and found to be negative unless listed in HPI.    Current Outpatient Medications  Medication Sig Dispense Refill  . albuterol (PROVENTIL HFA;VENTOLIN HFA) 108 (90 BASE) MCG/ACT inhaler Inhale 2 puffs into the lungs every 4 (four) hours as needed for shortness of breath.     Marland Kitchen apixaban (ELIQUIS) 5 MG TABS tablet Take 1 tablet (5 mg total) by mouth 2 (two) times daily. 180 tablet 3  . atorvastatin (LIPITOR) 80 MG tablet Take 80 mg by mouth every evening.     . B Complex-C-Zn-Folic Acid (DIALYVITE 810-FBPZ 15) 0.8 MG TABS Take 1 tablet by mouth every evening.     . B-D ULTRAFINE III SHORT PEN 31G X 8 MM MISC Inject into the skin 2 (two) times daily.    . betamethasone dipropionate 0.05 % cream Apply topically.    . Continuous Blood Gluc Receiver (FREESTYLE LIBRE READER) DEVI 1 kit by Does not apply route daily. Use as instructed E11.65 1 each 0  . Continuous Blood Gluc Sensor (FREESTYLE LIBRE 14 DAY SENSOR) MISC 1 every 14 days E11.65 6 each 0  . ethyl chloride spray Apply 1  application topically Every Tuesday,Thursday,and Saturday with dialysis.     . fluticasone (CUTIVATE) 0.005 % ointment Apply 1 application topically 2 (two) times daily as needed (skin irritation/rash).   2  . glucose blood test strip Use to check blood sugar 4 times a day. E11.65 400 each 12  . hydrALAZINE (APRESOLINE) 100 MG tablet Take 50 mg by mouth 3 (three) times daily.    . insulin aspart (NOVOLOG FLEXPEN) 100 UNIT/ML FlexPen Inject 8 Units into the skin 2 (two) times daily with breakfast and lunch AND 10 Units daily  with supper. (Patient taking differently: Inject 10 Units into the skin 2 (two) times daily with breakfast and lunch AND 12 Units daily with supper.) 15 mL 6  . insulin degludec (TRESIBA FLEXTOUCH) 100 UNIT/ML SOPN FlexTouch Pen Inject 0.2 mLs (20 Units total) into the skin daily. 15 mL 6  . isosorbide mononitrate (IMDUR) 30 MG 24 hr tablet Take 1 tablet by mouth once daily (Patient taking differently: Take 30 mg by mouth daily. ) 90 tablet 3  . ketoconazole (NIZORAL) 2 % cream Apply topically.    . latanoprost (XALATAN) 0.005 % ophthalmic solution Place 1 drop into both eyes at bedtime.    . metoprolol succinate (TOPROL-XL) 25 MG 24 hr tablet Take 2 tablets (50 mg total) by mouth every morning AND 1 tablet (25 mg total) every evening. 270 tablet 3  . NARCAN 4 MG/0.1ML LIQD nasal spray kit Place 1 spray into the nose See admin instructions. Call EMS at first sign of opioid overdose. Place on spray in nose.  If no response, then place one spray ing the opposite side of the nose.    . ondansetron (ZOFRAN) 4 MG tablet Take 4 mg by mouth 2 (two) times daily as needed for nausea.     . pantoprazole (PROTONIX) 40 MG tablet Take 40 mg by mouth every evening.     . Tiotropium Bromide Monohydrate (SPIRIVA RESPIMAT) 2.5 MCG/ACT AERS Inhale 2 puffs into the lungs daily. 4 g 5  . Tiotropium Bromide-Olodaterol (STIOLTO RESPIMAT) 2.5-2.5 MCG/ACT AERS Inhale 2 puffs into the lungs daily. 4 g 0  .  torsemide (DEMADEX) 20 MG tablet TAKE 4 TABLETS BY MOUTH THREE TIMES DAILY **TAKE  AN  ADDITIONAL  2  TABLETS  AS  NEEDED** 240 tablet 0  . traMADol (ULTRAM) 50 MG tablet Take 50 mg by mouth every 6 (six) hours as needed for severe pain (for pain.).     Marland Kitchen triamcinolone cream (KENALOG) 0.1 % Apply 1 application topically 2 (two) times daily as needed (skin rash/irritation.).      No current facility-administered medications for this encounter.   Today's Vitals   10/24/19 1032  BP: (!) 142/74  Pulse: 92  SpO2: 98%  Weight: 89.8 kg (198 lb)  Height: _0  (1.575 m)   Body mass index is 36.21 kg/m. Wt Readings from Last 3 Encounters:  10/24/19 90 kg (198 lb 6.6 oz)  10/24/19 89.8 kg (198 lb)  10/01/19 89.8 kg (198 lb)   General: NAD Neck: No JVD, no thyromegaly or thyroid nodule.  Lungs: Clear to auscultation bilaterally with normal respiratory effort. CV: Nondisplaced PMI.  Heart regular S1/S2, no S3/S4, 2/6 early SEM RUSB.  No peripheral edema.  No carotid bruit.  Normal pedal pulses.  Abdomen: Soft, nontender, no hepatosplenomegaly, no distention.  Skin: Intact without lesions or rashes.  Neurologic: Alert and oriented x 3.  Psych: Normal affect. Extremities: No clubbing or cyanosis.  HEENT: Normal.   Assessment/Plan: 1. Chronic primarily diastolic CHF: Echo in 9/37 with EF 50-55%, mild LVH, grade 2 diastolic dysfunction. LHC in 2015 showed nonobstructive CAD and elevated filling pressures with pulmonary venous hypertension.[place] 11/23/17 showed significantly elevated right and left heart filling pressures and severe primarily pulmonary venous hypertension. Cardiac output preserved. Echo (10/19) with EF 45-50%, septal-lateral dyssynchrony, RV looked ok.  PYP scan was not suggestive of transthyretin amyloidosis.  Echo in 1/21 with EF 55-60%, normal RV.  RHC/LHC in 4/21 with no significant CAD, normal filling pressures and cardiac output.  NYHA  class III symptoms chronically.  She is  not volume overloaded on exam, volume controlled by HD.  I suspect that her dyspnea is related primarily to lung disease, has COPD as well as restrictive lung disease likely due to body habitus.  - Given HD-dependence and only small amount of urine produced, I think that she could stop torsemide.  I asked that she check in with her nephrologist about this.  2. COPD: She no longer smokes. Restrictive PFTs. High rest CT negative for ILD. Suspect restrictive PFTs are related to body habitus.  3. HTN: BP controlled.   4. CKD: Stage 3.  BMET today. 5. Atrial fibrillation: Paroxysmal.  She is in NSR today.  - Continue apixaban 5 mg bid CBC today.  6. Pulmonary HTN: Mild PH on 4/21 RHC.  7. Hyperlipidemia: Check lipids today.   Followup 6 months with NP/PA.   Loralie Champagne, MD  10/24/2019

## 2019-10-24 NOTE — Patient Instructions (Signed)
Labs done today, your results will be available in MyChart, we will contact you for abnormal readings.  Please call our office in January to schedule your follow up appointment  If you have any questions or concerns before your next appointment please send [place] a message through [place] or call our office at [phone].    TO LEAVE A MESSAGE FOR THE NURSE SELECT OPTION 2, PLEASE LEAVE A MESSAGE INCLUDING: . YOUR NAME . DATE OF BIRTH . CALL BACK NUMBER . REASON FOR CALL**this is important as we prioritize the call backs  [place] AS LONG AS YOU CALL BEFORE 4:00 PM  At the [facility], you and your health needs are our priority. As part of our continuing mission to provide you with exceptional heart care, we have created designated Provider Care Teams. These Care Teams include your primary Cardiologist (physician) and Advanced Practice Providers (APPs- Physician Assistants and Nurse Practitioners) who all work together to provide you with the care you need, when you need it.   You may see any of the following providers on your designated Care Team at your next follow up: Marland Kitchen Dr Glori Bickers . Dr Loralie Champagne . Darrick Grinder, NP . Lyda Jester, PA . Audry Riles, PharmD   Please be sure to bring in all your medications bottles to every appointment.

## 2019-10-25 ENCOUNTER — Telehealth (HOSPITAL_COMMUNITY): Payer: Self-pay

## 2019-10-25 DIAGNOSIS — D689 Coagulation defect, unspecified: Secondary | ICD-10-CM | POA: Diagnosis not present

## 2019-10-25 DIAGNOSIS — N2581 Secondary hyperparathyroidism of renal origin: Secondary | ICD-10-CM | POA: Diagnosis not present

## 2019-10-25 DIAGNOSIS — Z23 Encounter for immunization: Secondary | ICD-10-CM | POA: Diagnosis not present

## 2019-10-25 DIAGNOSIS — N186 End stage renal disease: Secondary | ICD-10-CM | POA: Diagnosis not present

## 2019-10-25 DIAGNOSIS — D509 Iron deficiency anemia, unspecified: Secondary | ICD-10-CM | POA: Diagnosis not present

## 2019-10-25 DIAGNOSIS — Z992 Dependence on renal dialysis: Secondary | ICD-10-CM | POA: Diagnosis not present

## 2019-10-25 NOTE — Telephone Encounter (Signed)
Contacted patient regarding follow up appointment from the ED to be seen at the A-fib clinic. She is scheduled on September 8th @ 9:30am. Patient was told the parking code for the garage and she was given the phone number to our clinic. Patient verbalized understanding.

## 2019-10-31 ENCOUNTER — Encounter (HOSPITAL_COMMUNITY): Payer: Self-pay | Admitting: Physician Assistant

## 2019-10-31 ENCOUNTER — Ambulatory Visit (HOSPITAL_COMMUNITY)
Admission: RE | Admit: 2019-10-31 | Discharge: 2019-10-31 | Disposition: A | Discharge status: Home / Self Care | Payer: HMO | Source: Ambulatory Visit | Attending: Physician Assistant | Admitting: Physician Assistant

## 2019-10-31 ENCOUNTER — Other Ambulatory Visit: Payer: Self-pay

## 2019-10-31 VITALS — BP 132/60 | HR 71 | Ht 62.0 in | Wt 196.6 lb

## 2019-10-31 DIAGNOSIS — I4891 Unspecified atrial fibrillation: Secondary | ICD-10-CM | POA: Insufficient documentation

## 2019-10-31 DIAGNOSIS — J449 Chronic obstructive pulmonary disease, unspecified: Secondary | ICD-10-CM | POA: Diagnosis not present

## 2019-10-31 DIAGNOSIS — Z7901 Long term (current) use of anticoagulants: Secondary | ICD-10-CM | POA: Insufficient documentation

## 2019-10-31 DIAGNOSIS — E785 Hyperlipidemia, unspecified: Secondary | ICD-10-CM | POA: Insufficient documentation

## 2019-10-31 DIAGNOSIS — I130 Hypertensive heart and chronic kidney disease with heart failure and stage 1 through stage 4 chronic kidney disease, or unspecified chronic kidney disease: Secondary | ICD-10-CM | POA: Diagnosis not present

## 2019-10-31 DIAGNOSIS — Z79899 Other long term (current) drug therapy: Secondary | ICD-10-CM | POA: Insufficient documentation

## 2019-10-31 DIAGNOSIS — E669 Obesity, unspecified: Secondary | ICD-10-CM | POA: Insufficient documentation

## 2019-10-31 DIAGNOSIS — N183 Chronic kidney disease, stage 3 unspecified: Secondary | ICD-10-CM | POA: Insufficient documentation

## 2019-10-31 DIAGNOSIS — I5032 Chronic diastolic (congestive) heart failure: Secondary | ICD-10-CM | POA: Diagnosis not present

## 2019-10-31 DIAGNOSIS — Z87891 Personal history of nicotine dependence: Secondary | ICD-10-CM | POA: Diagnosis not present

## 2019-10-31 DIAGNOSIS — Z6835 Body mass index (BMI) 35.0-35.9, adult: Secondary | ICD-10-CM | POA: Diagnosis not present

## 2019-10-31 DIAGNOSIS — Z794 Long term (current) use of insulin: Secondary | ICD-10-CM | POA: Insufficient documentation

## 2019-10-31 DIAGNOSIS — I480 Paroxysmal atrial fibrillation: Secondary | ICD-10-CM | POA: Insufficient documentation

## 2019-10-31 DIAGNOSIS — D6869 Other thrombophilia: Secondary | ICD-10-CM | POA: Insufficient documentation

## 2019-10-31 DIAGNOSIS — E1122 Type 2 diabetes mellitus with diabetic chronic kidney disease: Secondary | ICD-10-CM | POA: Insufficient documentation

## 2019-10-31 NOTE — Progress Notes (Signed)
Primary Care Physician: Alroy Dust, L.Marlou Sa, MD Primary Cardiologist: Dr Aundra Dubin Primary Electrophysiologist: none Referring Physician: [facility]   Stephanie Woodward is a 79 y.o. female with a history of ESRD on HD Tu/Th/Sa, chronic diastolic CHF, COPD, DM, HLD, HTN, pulm HTN, and paroxysmal atrial fibrillation who presents for consultation in the [facility].  The patient was initially diagnosed with atrial fibrillation remotely. Patient is on Eliquis for a CHADS2VASC score of 6. She presented to the ED 10/24/19 with palpitations and chest pain. ECG showed afib with RVR and she was given two doses of diltiazem by EMS which slowed her rate. She underwent successful DCCV at that time. She has not had any further heart racing. There were no specific triggers that should could identify.   Today, she denies symptoms of palpitations, chest pain, shortness of breath, orthopnea, PND, lower extremity edema, dizziness, presyncope, syncope, snoring, daytime somnolence, bleeding, or neurologic sequela. The patient is tolerating medications without difficulties and is otherwise without complaint today.    Atrial Fibrillation Risk Factors:  she does not have symptoms or diagnosis of sleep apnea. Negative sleep study 07/2019 she does not have a history of rheumatic fever. She does have a family history of afib. Daughter has afib/PPM.   she has a BMI of Body mass index is 35.96 kg/m.Marland Kitchen Filed Weights   10/31/19 0925  Weight: 89.2 kg    Family History  Problem Relation Age of Onset  . Lupus Daughter   . Cancer Mother 2  . CVA Father 13  . Cancer Brother 34     Atrial Fibrillation Management history:  Previous antiarrhythmic drugs: none Previous cardioversions: 10/24/19 Previous ablations: none CHADS2VASC score: 6 Anticoagulation history: Eliquis   Past Medical History:  Diagnosis Date  . Arthritis   . Asthma   . Atrial fibrillation ([place])   . CHF (congestive  heart failure) ([place])   . CKD (chronic kidney disease), stage III   . COPD (chronic obstructive pulmonary disease) ([place])   . Diabetes mellitus    INSULIN DEPENDENT  . Gout   . Heart murmur    no issues per pt  . History of kidney stones   . Hyperlipemia   . Hypertension   . Psoriasis   . Renal disorder    congenital  . Single kidney   . Sleep apnea    doesn't use the Cpap   Past Surgical History:  Procedure Laterality Date  . ABDOMINAL HYSTERECTOMY    . AV FISTULA PLACEMENT Left 11/17/2018   Procedure: BRACHIO-CEPHALIC ARTERIOVENOUS (AV) FISTULA CREATION IN LEFT ARM;  Surgeon: Marty Heck, MD;  Location: [facility];  Service: Vascular;  Laterality: Left;  . CHOLECYSTECTOMY    . INSERTION OF DIALYSIS CATHETER Right 01/26/2019   Procedure: INSERTION OF DIALYSIS CATHETER, right internal jugular;  Surgeon: Angelia Mould, MD;  Location: [facility];  Service: Vascular;  Laterality: Right;  . LEFT AND RIGHT HEART CATHETERIZATION WITH CORONARY ANGIOGRAM N/A 11/29/2013   Procedure: LEFT AND RIGHT HEART CATHETERIZATION WITH CORONARY ANGIOGRAM;  Surgeon: Jacolyn Reedy, MD;  Location: [facility] CATH LAB;  Service: Cardiovascular;  Laterality: N/A;  . RIGHT HEART CATH N/A 11/23/2017   Procedure: RIGHT HEART CATH;  Surgeon: Larey Dresser, MD;  Location: [place] [facility] CV LAB;  Service: Cardiovascular;  Laterality: N/A;  . RIGHT/LEFT HEART CATH AND CORONARY ANGIOGRAPHY N/A 06/15/2019   Procedure: RIGHT/LEFT HEART CATH AND CORONARY ANGIOGRAPHY;  Surgeon: Larey Dresser, MD;  Location: [facility]  INVASIVE CV LAB;  Service: Cardiovascular;  Laterality: N/A;    Current Outpatient Medications  Medication Sig Dispense Refill  . albuterol (PROVENTIL HFA;VENTOLIN HFA) 108 (90 BASE) MCG/ACT inhaler Inhale 2 puffs into the lungs every 4 (four) hours as needed for shortness of breath.     Marland Kitchen apixaban (ELIQUIS) 5 MG TABS tablet Take 1 tablet (5 mg total) by mouth 2 (two) times daily. 180 tablet 3  . atorvastatin  (LIPITOR) 80 MG tablet Take 80 mg by mouth every evening.     . B Complex-C-Zn-Folic Acid (DIALYVITE 370-WUGQ 15) 0.8 MG TABS Take 1 tablet by mouth every evening.     . B-D ULTRAFINE III SHORT PEN 31G X 8 MM MISC Inject into the skin 2 (two) times daily.    . betamethasone dipropionate 0.05 % cream Apply topically.    . Continuous Blood Gluc Receiver (FREESTYLE LIBRE READER) DEVI 1 kit by Does not apply route daily. Use as instructed E11.65 1 each 0  . Continuous Blood Gluc Sensor (FREESTYLE LIBRE 14 DAY SENSOR) MISC 1 every 14 days E11.65 6 each 0  . ethyl chloride spray Apply 1 application topically Every Tuesday,Thursday,and Saturday with dialysis.     . fluticasone (CUTIVATE) 0.005 % ointment Apply 1 application topically 2 (two) times daily as needed (skin irritation/rash).   2  . glucose blood test strip Use to check blood sugar 4 times a day. E11.65 400 each 12  . hydrALAZINE (APRESOLINE) 100 MG tablet Take 50 mg by mouth 3 (three) times daily.    . insulin aspart (NOVOLOG FLEXPEN) 100 UNIT/ML FlexPen Inject 8 Units into the skin 2 (two) times daily with breakfast and lunch AND 10 Units daily with supper. (Patient taking differently: Inject 10 Units into the skin 2 (two) times daily with breakfast and lunch AND 12 Units daily with supper.) 15 mL 6  . insulin degludec (TRESIBA FLEXTOUCH) 100 UNIT/ML SOPN FlexTouch Pen Inject 0.2 mLs (20 Units total) into the skin daily. 15 mL 6  . isosorbide mononitrate (IMDUR) 30 MG 24 hr tablet Take 1 tablet by mouth once daily 90 tablet 3  . ketoconazole (NIZORAL) 2 % cream Apply topically.    . latanoprost (XALATAN) 0.005 % ophthalmic solution Place 1 drop into both eyes at bedtime.    . metoprolol succinate (TOPROL-XL) 25 MG 24 hr tablet Take 2 tablets (50 mg total) by mouth every morning AND 1 tablet (25 mg total) every evening. 270 tablet 3  . NARCAN 4 MG/0.1ML LIQD nasal spray kit Place 1 spray into the nose See admin instructions. Call EMS at first  sign of opioid overdose. Place on spray in nose.  If no response, then place one spray ing the opposite side of the nose.    . ondansetron (ZOFRAN) 4 MG tablet Take 4 mg by mouth 2 (two) times daily as needed for nausea.     . pantoprazole (PROTONIX) 40 MG tablet Take 40 mg by mouth every evening.     . Tiotropium Bromide Monohydrate (SPIRIVA RESPIMAT) 2.5 MCG/ACT AERS Inhale 2 puffs into the lungs daily. 4 g 5  . Tiotropium Bromide-Olodaterol (STIOLTO RESPIMAT) 2.5-2.5 MCG/ACT AERS Inhale 2 puffs into the lungs daily. 4 g 0  . torsemide (DEMADEX) 20 MG tablet TAKE 4 TABLETS BY MOUTH THREE TIMES DAILY **TAKE  AN  ADDITIONAL  2  TABLETS  AS  NEEDED** 240 tablet 0  . traMADol (ULTRAM) 50 MG tablet Take 50 mg by mouth every 6 (six) hours  as needed for severe pain (for pain.).     Marland Kitchen triamcinolone cream (KENALOG) 0.1 % Apply 1 application topically 2 (two) times daily as needed (skin rash/irritation.).      No current facility-administered medications for this encounter.    Allergies  Allergen Reactions  . Eggs Or Egg-Derived Products Nausea And Vomiting  . Lisinopril Nausea And Vomiting  . Penicillins Nausea And Vomiting    Has patient had a PCN reaction causing immediate rash, facial/tongue/throat swelling, SOB or lightheadedness with hypotension: No Has patient had a PCN reaction causing severe rash involving mucus membranes or skin necrosis: No Has patient had a PCN reaction that required hospitalization: No Has patient had a PCN reaction occurring within the last 10 years: No If all of the above answers are "NO", then may proceed with Cephalosporin use.     Social History   Socioeconomic History  . Marital status: Widowed    Spouse name: Not on file  . Number of children: 2  . Years of education: Not on file  . Highest education level: Not on file  Occupational History  . Occupation: retired  Tobacco Use  . Smoking status: Former Smoker    Packs/day: 1.00    Years: 20.00     Pack years: 20.00    Types: Cigarettes    Quit date: 02/22/2009    Years since quitting: 10.6  . Smokeless tobacco: Never Used  Vaping Use  . Vaping Use: Never used  Substance and Sexual Activity  . Alcohol use: Yes    Alcohol/week: 0.0 standard drinks    Comment: occasional beer  . Drug use: Not Currently    Comment: marijuana in the past  . Sexual activity: Never  Other Topics Concern  . Not on file  Social History Narrative  . Not on file   Social Determinants of Health   Financial Resource Strain:   . Difficulty of Paying Living Expenses: Not on file  Food Insecurity:   . Worried About Charity fundraiser in the Last Year: Not on file  . Ran Out of Food in the Last Year: Not on file  Transportation Needs:   . Lack of Transportation (Medical): Not on file  . Lack of Transportation (Non-Medical): Not on file  Physical Activity:   . Days of Exercise per Week: Not on file  . Minutes of Exercise per Session: Not on file  Stress:   . Feeling of Stress : Not on file  Social Connections:   . Frequency of Communication with Friends and Family: Not on file  . Frequency of Social Gatherings with Friends and Family: Not on file  . Attends Religious Services: Not on file  . Active Member of Clubs or Organizations: Not on file  . Attends Archivist Meetings: Not on file  . Marital Status: Not on file  Intimate Partner Violence:   . Fear of Current or Ex-Partner: Not on file  . Emotionally Abused: Not on file  . Physically Abused: Not on file  . Sexually Abused: Not on file     ROS- All systems are reviewed and negative except as per the HPI above.  Physical Exam: Vitals:   10/31/19 0925  BP: 132/60  Pulse: 71  Weight: 89.2 kg  Height: '5\' 2"'  (1.575 m)    GEN- The patient is well appearing obese elderly female, alert and oriented x 3 today.   Head- normocephalic, atraumatic Eyes-  Sclera clear, conjunctiva pink Ears- hearing intact Oropharynx-  clear Neck-  supple  Lungs- Clear to ausculation bilaterally, normal work of breathing Heart- Regular rate and rhythm, no rubs or gallops. 2/6 systolic murmur.  GI- soft, NT, ND, + BS Extremities- no clubbing, cyanosis, or edema MS- no significant deformity or atrophy Skin- no rash or lesion Psych- euthymic mood, full affect Neuro- strength and sensation are intact  Wt Readings from Last 3 Encounters:  10/31/19 89.2 kg  10/24/19 90 kg  10/24/19 89.8 kg    EKG today demonstrates SR HR 71, IVCD, PR 184, QRS 124, QTc 462  Echo 03/05/19 demonstrated  1. Left ventricular ejection fraction, by visual estimation, is 55 to  60%. The left ventricle has normal function. There is no left ventricular  hypertrophy.  2. The left ventricle has no regional wall motion abnormalities.  3. Abnormal septal motion consistent with left bundle branch block.  4. Left ventricular diastolic parameters are consistent with Grade II  diastolic dysfunction (pseudonormalization).  5. Elevated left ventricular end-diastolic pressure.  6. Global right ventricle has normal systolic function.The right  ventricular size is normal. No increase in right ventricular wall  thickness.  7. Left atrial size was mildly dilated.  8. Right atrial size was mildly dilated.  9. Small pericardial effusion.  10. The pericardial effusion is circumferential.  11. Mild mitral annular calcification.  12. The mitral valve is normal in structure. Trivial mitral valve  regurgitation.  13. The tricuspid valve is normal in structure.  14. The aortic valve is tricuspid. Aortic valve regurgitation is not  visualized.  15. The pulmonic valve was grossly normal. Pulmonic valve regurgitation is  trivial.  16. The inferior vena cava is normal in size with greater than 50%  respiratory variability, suggesting right atrial pressure of 3 mmHg.  17. Cannot exclude ASD/PFO.   Epic records are reviewed at length today  CHA2DS2-VASc Score = 6    The patient's score is based upon: CHF History: 1 HTN History: 1 Age : 2 Diabetes History: 1 Stroke History: 0 Vascular Disease History: 0 Gender: 1      ASSESSMENT AND PLAN: 1. Paroxysmal Atrial Fibrillation (ICD10:  I48.0) The patient's CHA2DS2-VASc score is 6, indicating a 9.7% annual risk of stroke.   S/p DCCV 10/24/19 General education about afib provided and questions answered. We also discussed her stroke risk and the risks and benefits of anticoagulation. Patient appears to be maintaining SR. Given that this is her first episode in years, will not make changes today. If her afib becomes more persistent, would consider AAD. Continue Eliquis 5 mg BID Continue Toprol 50 mg AM and 25 mg PM  2. Secondary Hypercoagulable State (ICD10:  D68.69) The patient is at significant risk for stroke/thromboembolism based upon her CHA2DS2-VASc Score of 6.  Continue Apixaban (Eliquis).   3. Obesity Body mass index is 35.96 kg/m. Lifestyle modification was discussed at length including regular exercise and weight reduction.  4. Chronic diastolic CHF No signs or symptoms of fluid overload today. Volume managed by HD.  5. HTN Stable, no changes today.   Follow up with Dr Aundra Dubin per recall.    [facility] [address] [place], [place] [address] [phone] 10/31/2019 9:36 AM

## 2019-11-02 ENCOUNTER — Ambulatory Visit: Payer: HMO | Admitting: Internal Medicine

## 2019-11-05 NOTE — ED Provider Notes (Signed)
.  Critical Care Performed by: Wyvonnia Dusky, MD Authorized by: Wyvonnia Dusky, MD   Critical care provider statement:    Critical care time (minutes):  35   Critical care was necessary to treat or prevent imminent or life-threatening deterioration of the following conditions:  Circulatory failure   Critical care was time spent personally by me on the following activities:  Discussions with consultants, evaluation of patient's response to treatment, examination of patient, ordering and performing treatments and interventions, ordering and review of laboratory studies, ordering and review of radiographic studies, pulse oximetry, re-evaluation of patient's condition, obtaining history from patient or surrogate and review of old charts Comments:     Cardioversion, repeat ecg interpretation      Wyvonnia Dusky, MD 11/05/19 1240

## 2019-11-15 ENCOUNTER — Ambulatory Visit: Payer: HMO | Admitting: Emergency Medicine

## 2019-11-22 DIAGNOSIS — Z992 Dependence on renal dialysis: Secondary | ICD-10-CM | POA: Diagnosis not present

## 2019-11-22 DIAGNOSIS — N186 End stage renal disease: Secondary | ICD-10-CM | POA: Diagnosis not present

## 2019-11-22 DIAGNOSIS — I129 Hypertensive chronic kidney disease with stage 1 through stage 4 chronic kidney disease, or unspecified chronic kidney disease: Secondary | ICD-10-CM | POA: Diagnosis not present

## 2019-11-24 DIAGNOSIS — R52 Pain, unspecified: Secondary | ICD-10-CM | POA: Diagnosis not present

## 2019-11-24 DIAGNOSIS — D689 Coagulation defect, unspecified: Secondary | ICD-10-CM | POA: Diagnosis not present

## 2019-11-24 DIAGNOSIS — N2581 Secondary hyperparathyroidism of renal origin: Secondary | ICD-10-CM | POA: Diagnosis not present

## 2019-11-24 DIAGNOSIS — N186 End stage renal disease: Secondary | ICD-10-CM | POA: Diagnosis not present

## 2019-11-24 DIAGNOSIS — Z992 Dependence on renal dialysis: Secondary | ICD-10-CM | POA: Diagnosis not present

## 2019-11-24 DIAGNOSIS — E1122 Type 2 diabetes mellitus with diabetic chronic kidney disease: Secondary | ICD-10-CM | POA: Diagnosis not present

## 2019-11-24 DIAGNOSIS — D631 Anemia in chronic kidney disease: Secondary | ICD-10-CM | POA: Diagnosis not present

## 2019-11-24 DIAGNOSIS — D509 Iron deficiency anemia, unspecified: Secondary | ICD-10-CM | POA: Diagnosis not present

## 2019-12-03 ENCOUNTER — Other Ambulatory Visit: Payer: Self-pay

## 2019-12-03 NOTE — Patient Outreach (Signed)
[facility]) Care Management Chronic Special Needs Program  12/03/2019  Name: Stephanie Woodward DOB: July 26, 1940  MRN: [id number]  [name] is enrolled in a chronic special needs plan for Diabetes. Telephone call to client for CSNP assessment follow up. HIPAA verified. Client states she is doing well. She reports consistent follow up with her primary care providers and reports having transportation to appointments. Client states she is taking her medications as prescribed.  Client reports fasting blood sugar this am was 110.  Reports only rare occasion for hypoglycemic episodes. Client states she goes to dialysis on Tuesday's, Thursdays, and Saturdays and continues to have services with [facility].    Goals Addressed            This Visit's Progress   . COMPLETED:  Acknowledge receipt of Advanced Directive package   On track    Client states she received the Advanced directive packet.     . Client understands the importance of follow-up with providers by attending scheduled visits   On track    Most recent primary care provider visit 02/19/19 Most recent endocrinology visit 04/04/19 Most recent cardiology visit 03/05/19 Client reports having services with [facility] Tuesdays, Thursdays, and Saturday's Continue to maintain and keep follow up visits with your providers     . Client verbalize knowledge of Heart Failure disease self management skills in 3 months   On track    Continue to weigh daily Continue to take your medications as prescribed.  RN case manager reviewed symptoms of heart failure with client.  Report mild symptoms to your doctor. Call 911 for severe heart failure symptoms.  Continue to follow a low salt diet.        . Client will verbalize knowledge of chronic lung disease as evidenced by no ED visits or Inpatient stays related to chronic lung disease    On track    Client denies any emergency room and hospital admissions within the  past 5 months.  Refer to your health team advantage calendar for review of COPD education.  Discuss with your RN case manager if you have questions.  Continue to take your medications as advised.  Contact your doctor if you have questions/ concerns,  Keep your follow up appointments with your doctor.      . Client will verbalize knowledge of self management of Hypertension as evidences by BP reading of 140/90 or less; or as defined by provider   On track    Client reports her blood pressures are monitored at dialysis center on Tuesdays, Thursdays and Saturday's. Continue to refer to your Health team advantage calendar for high blood pressure information Take your medications as prescribed Continue to follow a low salt diet.  Refer to your [employer] Exercise book sent to you in the mail.      Marland Kitchen HEMOGLOBIN A1C < 7       Your last Hgb A!c was 7.5%.  Have your Hgb A1c checked every 6 months if you are at goal or every 3 months if you are not at goal.  Continue to follow diabetes self management actions:  Glucose monitoring per provider recommendation  Visit provider every 3-6 months as directed  Hbg A1C level every 3-6 months.  Carbohydrate controlled meal planning  Taking diabetes medication as prescribed by provider  Physical activity  Reviewed signs and symptoms of Hyperglycemia( high blood sugar) and Hypoglycemia (Low blood sugar) and actions to take   Please follow the RULE  OF 15 for the treatment of hypoglycemia treatment (When your blood sugars are less than 70 mg/ dl) STEP  1:  Take 15 grams of carbohydrates when your blood sugar is low, which includes:   3-4 glucose tabs or  3-4 oz of juice or regular soda or  One tube of glucose gel STEP 2:  Recheck blood sugar in 15 minutes STEP 3:  If your blood sugar is still low at the 15 minute recheck ---then, go back to STEP 1 and treat again with another 15 grams of carbohydrates     . Maintain timely refills of  diabetic medication as prescribed within the year .   On track    Client reports maintaining timely refills of her diabetic medications.  Contact your assigned RN case manager [phone]) if your are unable to obtain your medications.       . COMPLETED: Obtain annual  Lipid Profile, LDL-C   On track    Lipid profile completed 11/03/19     . COMPLETED: Obtain Annual Eye (retinal)  Exam    On track    Eye exam 06/05/19 Client next eye exam is scheduled for 06/04/20     . COMPLETED: Obtain Annual Foot Exam   On track    Diabetic foot exam 04/04/19     . COMPLETED: Obtain annual screen for micro albuminuria (urine) , nephropathy (kidney problems)       Client is on dialysis, Tuesdays, Thursdays and Saturdays.      . COMPLETED: Obtain Hemoglobin A1C at least 2 times per year   On track    Hgb A1c completed 04/04/19 and 07/02/19     . Visit Primary Care Provider or Endocrinologist at least 2 times per year    On track    Most recent primary care provider visit 02/19/19 Most recent endocrinology visit 04/04/19 Continue to follow up with your doctor as recommended.        Plan:  Send successful outreach letter with a copy of their individualized care plan and Send individual care plan to provider  Chronic care management coordinator will outreach in:  6 months.    Quinn Plowman RN,BSN,CCM Chronic Care Management Coordinator [place] [facility] [phone]    .

## 2019-12-22 ENCOUNTER — Other Ambulatory Visit (HOSPITAL_COMMUNITY): Payer: Self-pay | Admitting: Cardiology

## 2019-12-23 DIAGNOSIS — I129 Hypertensive chronic kidney disease with stage 1 through stage 4 chronic kidney disease, or unspecified chronic kidney disease: Secondary | ICD-10-CM | POA: Diagnosis not present

## 2019-12-23 DIAGNOSIS — N186 End stage renal disease: Secondary | ICD-10-CM | POA: Diagnosis not present

## 2019-12-23 DIAGNOSIS — Z992 Dependence on renal dialysis: Secondary | ICD-10-CM | POA: Diagnosis not present

## 2019-12-25 DIAGNOSIS — N186 End stage renal disease: Secondary | ICD-10-CM | POA: Diagnosis not present

## 2019-12-25 DIAGNOSIS — D509 Iron deficiency anemia, unspecified: Secondary | ICD-10-CM | POA: Diagnosis not present

## 2019-12-25 DIAGNOSIS — N2581 Secondary hyperparathyroidism of renal origin: Secondary | ICD-10-CM | POA: Diagnosis not present

## 2019-12-25 DIAGNOSIS — E1122 Type 2 diabetes mellitus with diabetic chronic kidney disease: Secondary | ICD-10-CM | POA: Diagnosis not present

## 2019-12-25 DIAGNOSIS — D689 Coagulation defect, unspecified: Secondary | ICD-10-CM | POA: Diagnosis not present

## 2019-12-25 DIAGNOSIS — R52 Pain, unspecified: Secondary | ICD-10-CM | POA: Diagnosis not present

## 2019-12-25 DIAGNOSIS — Z992 Dependence on renal dialysis: Secondary | ICD-10-CM | POA: Diagnosis not present

## 2019-12-25 DIAGNOSIS — D631 Anemia in chronic kidney disease: Secondary | ICD-10-CM | POA: Diagnosis not present

## 2019-12-27 ENCOUNTER — Other Ambulatory Visit: Payer: Self-pay

## 2019-12-27 NOTE — Patient Outreach (Signed)
  [facility]) Care Management Chronic Special Needs Program    12/27/2019  Name: Stephanie Woodward, DOB: [birth year]  MRN: [id number]  [employer] care management team has assumed care and services for this member. Case closed by [facility] care management  Quinn Plowman RN,BSN,CCM [place] [facility] [phone]

## 2019-12-31 ENCOUNTER — Ambulatory Visit: Payer: HMO | Admitting: Internal Medicine

## 2020-01-02 DIAGNOSIS — M259 Joint disorder, unspecified: Secondary | ICD-10-CM | POA: Diagnosis not present

## 2020-01-02 DIAGNOSIS — M5136 Other intervertebral disc degeneration, lumbar region: Secondary | ICD-10-CM | POA: Diagnosis not present

## 2020-01-02 DIAGNOSIS — G894 Chronic pain syndrome: Secondary | ICD-10-CM | POA: Diagnosis not present

## 2020-01-02 DIAGNOSIS — M5416 Radiculopathy, lumbar region: Secondary | ICD-10-CM | POA: Diagnosis not present

## 2020-01-09 ENCOUNTER — Telehealth: Payer: Self-pay | Admitting: Internal Medicine

## 2020-01-09 NOTE — Telephone Encounter (Signed)
Patient called and requested Free Style Libre sensor be called into her pharmacy [facility].  She had several that did not stick properly and she has had to use a couple extra.  Call back number [phone]

## 2020-01-10 ENCOUNTER — Other Ambulatory Visit: Payer: Self-pay | Admitting: Internal Medicine

## 2020-01-10 MED ORDER — FREESTYLE LIBRE 14 DAY SENSOR MISC: 1 refills | Status: DC

## 2020-01-10 NOTE — Telephone Encounter (Signed)
Refill sent as requested. 

## 2020-01-10 NOTE — Telephone Encounter (Signed)
Patient called again re: Patient is out of Continuous Blood Gluc Sensor (FREESTYLE LIBRE 14 DAY SENSOR) MISC and requests that a new RX for the above be sent asap to  [facility], [place] Phone:  [phone]  Fax:  [phone]

## 2020-01-11 ENCOUNTER — Encounter (HOSPITAL_COMMUNITY): Payer: Self-pay | Admitting: Cardiology

## 2020-01-11 ENCOUNTER — Ambulatory Visit (HOSPITAL_COMMUNITY)
Admission: RE | Admit: 2020-01-11 | Discharge: 2020-01-11 | Disposition: A | Discharge status: Home / Self Care | Payer: HMO | Source: Ambulatory Visit | Attending: Cardiology | Admitting: Cardiology

## 2020-01-11 ENCOUNTER — Other Ambulatory Visit: Payer: Self-pay

## 2020-01-11 VITALS — BP 178/70 | HR 109 | Wt 202.0 lb

## 2020-01-11 DIAGNOSIS — Z7901 Long term (current) use of anticoagulants: Secondary | ICD-10-CM | POA: Diagnosis not present

## 2020-01-11 DIAGNOSIS — I132 Hypertensive heart and chronic kidney disease with heart failure and with stage 5 chronic kidney disease, or end stage renal disease: Secondary | ICD-10-CM | POA: Insufficient documentation

## 2020-01-11 DIAGNOSIS — I272 Pulmonary hypertension, unspecified: Secondary | ICD-10-CM | POA: Diagnosis not present

## 2020-01-11 DIAGNOSIS — J449 Chronic obstructive pulmonary disease, unspecified: Secondary | ICD-10-CM | POA: Diagnosis not present

## 2020-01-11 DIAGNOSIS — I5022 Chronic systolic (congestive) heart failure: Secondary | ICD-10-CM | POA: Diagnosis not present

## 2020-01-11 DIAGNOSIS — N186 End stage renal disease: Secondary | ICD-10-CM | POA: Diagnosis not present

## 2020-01-11 DIAGNOSIS — I480 Paroxysmal atrial fibrillation: Secondary | ICD-10-CM

## 2020-01-11 DIAGNOSIS — E785 Hyperlipidemia, unspecified: Secondary | ICD-10-CM | POA: Insufficient documentation

## 2020-01-11 DIAGNOSIS — I2510 Atherosclerotic heart disease of native coronary artery without angina pectoris: Secondary | ICD-10-CM | POA: Insufficient documentation

## 2020-01-11 DIAGNOSIS — Z79899 Other long term (current) drug therapy: Secondary | ICD-10-CM | POA: Insufficient documentation

## 2020-01-11 DIAGNOSIS — I5042 Chronic combined systolic (congestive) and diastolic (congestive) heart failure: Secondary | ICD-10-CM | POA: Diagnosis not present

## 2020-01-11 DIAGNOSIS — E1122 Type 2 diabetes mellitus with diabetic chronic kidney disease: Secondary | ICD-10-CM | POA: Insufficient documentation

## 2020-01-11 DIAGNOSIS — Z794 Long term (current) use of insulin: Secondary | ICD-10-CM | POA: Insufficient documentation

## 2020-01-11 DIAGNOSIS — Z87891 Personal history of nicotine dependence: Secondary | ICD-10-CM | POA: Diagnosis not present

## 2020-01-11 LAB — CBC
HCT: 34.2 % — ABNORMAL LOW (ref 36.0–46.0)
Hemoglobin: 10.9 g/dL — ABNORMAL LOW (ref 12.0–15.0)
MCH: 31.1 pg (ref 26.0–34.0)
MCHC: 31.9 g/dL (ref 30.0–36.0)
MCV: 97.4 fL (ref 80.0–100.0)
Platelets: 275 10*3/uL (ref 150–400)
RBC: 3.51 MIL/uL — ABNORMAL LOW (ref 3.87–5.11)
RDW: 12.8 % (ref 11.5–15.5)
WBC: 11.6 10*3/uL — ABNORMAL HIGH (ref 4.0–10.5)
nRBC: 0 % (ref 0.0–0.2)

## 2020-01-11 LAB — TSH: TSH: 1.492 u[IU]/mL (ref 0.350–4.500)

## 2020-01-11 NOTE — Patient Instructions (Signed)
Labs done today, your results will be available in MyChart, we will contact you for abnormal readings.   Please call our office in April 2022 to schedule an appointment for follow up    If you have any questions or concerns before your next appointment please send [place] a message through [facility] or call our office at [phone].    TO LEAVE A MESSAGE FOR THE NURSE SELECT OPTION 2, PLEASE LEAVE A MESSAGE INCLUDING: . YOUR NAME . DATE OF BIRTH . CALL BACK NUMBER . REASON FOR CALL**this is important as we prioritize the call backs  [place] AS LONG AS YOU CALL BEFORE 4:00 PM   At the [facility], you and your health needs are our priority. As part of our continuing mission to provide you with exceptional heart care, we have created designated Provider Care Teams. These Care Teams include your primary Cardiologist (physician) and Advanced Practice Providers (APPs- Physician Assistants and Nurse Practitioners) who all work together to provide you with the care you need, when you need it.   You may see any of the following providers on your designated Care Team at your next follow up: Marland Kitchen Dr Glori Bickers . Dr Loralie Champagne . Darrick Grinder, NP . Lyda Jester, PA . Audry Riles, PharmD   Please be sure to bring in all your medications bottles to every appointment.

## 2020-01-13 NOTE — Progress Notes (Signed)
Advanced Heart Failure Clinic Note  PCP: Alroy Dust, L.Marlou Sa, MD HF Cardiology: Dr. Aundra Dubin  79 y.o.with history of ESRD, paroxysmal atrial fibrillation, and chronic diastolic CHF was referred by Dr. Wynonia Lawman for evaluation of CHF.  RHC/LHC was done in 10/15.  This showed markedly elevated filling pressures and pulmonary venous hypertension.  There was no significant CAD.  Last echo in 2/18 showed EF 50-55%, mild LVH, grade 2 diastolic dysfunction. She is followed by Dr. Moshe Cipro for nephrology.   Admitted 10/2-10/6/19 with SOB. RHC showed elevated filling pressures and preserved CO. She was diuresed with IV lasix, then transitioned to torsemide 80 mg am, 40 mg pm. Repeat echo showed EF 45-50% with normal RV. PYP scan was negative for TTR amyloid. PFTs showed restrictive disease. CT chest showed no ILD but did show right hydronephrosis. Renal US showed single kidney with mild to moderate hydronephrosis on right (unchanged since 2011).  DC weight 208 lbs.    In 12/20, she progressed to ESRD and was started on HD.    Echo in 1/21 showed EF 55-60% with normal.  RHC/LHC in 4/21 showed no obstructive CAD, normal filling pressures, mild pulmonary hypertension.  She had atrial fibrillation in 9/21 and underwent DCCV to NSR.   She returns for followup of diastolic CHF and atrial fibrillation.   She is in NSR today.  No palpitations.  BP high today but generally runs around 140/80 on non-HD days.  BP runs low at times with HD.  She fatigues easily.  Using walker x years for stability.  She was short of breath walking into the office today; chronic dyspnea after walking 100-200 feet.   No chest pain .  ECG (personally reviewed): NSR, PACs, IVCD 120 msec  Labs (2/18): K 4, creatinine 1.79 Labs (10/19): K 4.5, creatinine 2.4 Labs (12/19): LDL 62 Labs (1/20): K 3.9, creatinine 2.72 Labs (12/20): hgb 11.9 Labs (9/21): LDL 64, HDL 53  PMH: 1. Atrial fibrillation: Paroxysmal.  2. HTN 3.  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  5. ESRD 6. H/o IVCD 7. COPD: PFTs (10/19) actually showed severe restriction, concern for interstitial process.  CT chest (10/19) showed mild-moderate patchy air trapping but no evidence for interstitial lung disease.  8. Gout 9. Chronic primarily diastolic CHF: Echo (5/03) with EF 50-55%, mild LVH, grade 2 diastolic dysfunction.  - LHC/RHC (10/15): small distal LAD but no discrete stenosis.  Mean RA 15, PA 55/20 mean 34, mean PCWP 31 => pulmonary venous hypertension.  - PYP scan (10/19): No evidence for TTR amyloidosis.  - Echo (10/19): EF 45-50%, mild LVH, normal RV size and systolic function.  - RHC (10/19): mean RA 13, PA 74/27 mean 50, mean PCWP 28, CI 3.12, PVR 3.6 WU. Pulmonary venous hypertension.  - Echo (1/21): EF 55-60%, normal RV.  - LHC/RHC (4/21): super-dominant LCx with small LAD and RCA, no CAD; mean RA 5, PA 47/15, mean PCWP 10, CI 3.61, PVR 2.5 WU.  10. Negative sleep study in 6/21.   Review of systems complete and found to be negative unless listed in HPI.    Social History   Socioeconomic History  . Marital status: Widowed    Spouse name: Not on file  . Number of children: 2  . Years of education: Not on file  . Highest education level: Not on file  Occupational History  . Occupation: retired  Tobacco Use  . Smoking status: Former Smoker    Packs/day: 1.00    Years: 20.00  Pack years: 20.00    Types: Cigarettes    Quit date: 02/22/2009    Years since quitting: 10.8  . Smokeless tobacco: Never Used  Vaping Use  . Vaping Use: Never used  Substance and Sexual Activity  . Alcohol use: Yes    Alcohol/week: 0.0 standard drinks    Comment: occasional beer  . Drug use: Not Currently    Comment: marijuana in the past  . Sexual activity: Never  Other Topics Concern  . Not on file  Social History Narrative  . Not on file   Social Determinants of Health   Financial Resource Strain:   . Difficulty  of Paying Living Expenses: Not on file  Food Insecurity: No Food Insecurity  . Worried About Charity fundraiser in the Last Year: Never true  . Ran Out of Food in the Last Year: Never true  Transportation Needs: No Transportation Needs  . Lack of Transportation (Medical): No  . Lack of Transportation (Non-Medical): No  Physical Activity:   . Days of Exercise per Week: Not on file  . Minutes of Exercise per Session: Not on file  Stress:   . Feeling of Stress : Not on file  Social Connections:   . Frequency of Communication with Friends and Family: Not on file  . Frequency of Social Gatherings with Friends and Family: Not on file  . Attends Religious Services: Not on file  . Active Member of Clubs or Organizations: Not on file  . Attends Archivist Meetings: Not on file  . Marital Status: Not on file  Intimate Partner Violence:   . Fear of Current or Ex-Partner: Not on file  . Emotionally Abused: Not on file  . Physically Abused: Not on file  . Sexually Abused: Not on file   Family History  Problem Relation Age of Onset  . Lupus Daughter   . Cancer Mother 10  . CVA Father 19  . Cancer Brother 45   Review of systems complete and found to be negative unless listed in HPI.    Current Outpatient Medications  Medication Sig Dispense Refill  . albuterol (PROVENTIL HFA;VENTOLIN HFA) 108 (90 BASE) MCG/ACT inhaler Inhale 2 puffs into the lungs every 4 (four) hours as needed for shortness of breath.     Marland Kitchen apixaban (ELIQUIS) 5 MG TABS tablet Take 1 tablet (5 mg total) by mouth 2 (two) times daily. 180 tablet 3  . atorvastatin (LIPITOR) 80 MG tablet Take 80 mg by mouth every evening.     . B Complex-C-Zn-Folic Acid (DIALYVITE 664-QIHK 15) 0.8 MG TABS Take 1 tablet by mouth every evening.     . B-D ULTRAFINE III SHORT PEN 31G X 8 MM MISC Inject into the skin 2 (two) times daily.    . betamethasone dipropionate 0.05 % cream Apply topically.    . Continuous Blood Gluc Receiver  (FREESTYLE LIBRE READER) DEVI 1 kit by Does not apply route daily. Use as instructed E11.65 1 each 0  . Continuous Blood Gluc Sensor (FREESTYLE LIBRE 14 DAY SENSOR) MISC 1 every 14 days E11.65 6 each 1  . fluticasone (CUTIVATE) 0.005 % ointment Apply 1 application topically 2 (two) times daily as needed (skin irritation/rash).   2  . glucose blood test strip Use to check blood sugar 4 times a day. E11.65 400 each 12  . hydrALAZINE (APRESOLINE) 100 MG tablet TAKE 1 TABLET BY MOUTH THREE TIMES DAILY 270 tablet 1  . insulin aspart (NOVOLOG) 100 UNIT/ML  injection Inject 10 Units into the skin 2 (two) times daily. 12 units w dinner    . insulin degludec (TRESIBA FLEXTOUCH) 100 UNIT/ML SOPN FlexTouch Pen Inject 0.2 mLs (20 Units total) into the skin daily. 15 mL 6  . isosorbide mononitrate (IMDUR) 30 MG 24 hr tablet Take 1 tablet by mouth once daily 90 tablet 3  . ketoconazole (NIZORAL) 2 % cream Apply topically.    . latanoprost (XALATAN) 0.005 % ophthalmic solution Place 1 drop into both eyes at bedtime.    . metoprolol succinate (TOPROL-XL) 25 MG 24 hr tablet Take 2 tablets (50 mg total) by mouth every morning AND 1 tablet (25 mg total) every evening. 270 tablet 3  . NARCAN 4 MG/0.1ML LIQD nasal spray kit Place 1 spray into the nose See admin instructions. Call EMS at first sign of opioid overdose. Place on spray in nose.  If no response, then place one spray ing the opposite side of the nose.    . ondansetron (ZOFRAN) 4 MG tablet Take 4 mg by mouth 2 (two) times daily as needed for nausea.     . pantoprazole (PROTONIX) 40 MG tablet Take 40 mg by mouth every evening.     . torsemide (DEMADEX) 20 MG tablet Take 20 mg by mouth daily. 2 tabs    . traMADol (ULTRAM) 50 MG tablet Take 50 mg by mouth every 6 (six) hours as needed for severe pain (for pain.).     Marland Kitchen triamcinolone cream (KENALOG) 0.1 % Apply 1 application topically 2 (two) times daily as needed (skin rash/irritation.).      No current  facility-administered medications for this encounter.   Today's Vitals   01/11/20 1539  BP: (!) 178/70  Pulse: (!) 109  Weight: 91.6 kg (202 lb)   Body mass index is 36.95 kg/m. Wt Readings from Last 3 Encounters:  01/11/20 91.6 kg (202 lb)  10/31/19 89.2 kg (196 lb 9.6 oz)  10/24/19 90 kg (198 lb 6.6 oz)   General: NAD Neck: JVP 8-9 cm, no thyromegaly or thyroid nodule.  Lungs: Clear to auscultation bilaterally with normal respiratory effort. CV: Nondisplaced PMI.  Heart regular S1/S2, no S3/S4, 1/6 SEM RUSB.  1+ edema 1/2 to knees bilaterally.  No carotid bruit.  Normal pedal pulses.  Abdomen: Soft, nontender, no hepatosplenomegaly, mild distention.  Skin: Intact without lesions or rashes.  Neurologic: Alert and oriented x 3.  Psych: Normal affect. Extremities: No clubbing or cyanosis.  HEENT: Normal.   Assessment/Plan: 1. Chronic primarily diastolic CHF: Echo in 4/19 with EF 50-55%, mild LVH, grade 2 diastolic dysfunction. LHC in 2015 showed nonobstructive CAD and elevated filling pressures with pulmonary venous hypertension.[place] 11/23/17 showed significantly elevated right and left heart filling pressures and severe primarily pulmonary venous hypertension. Cardiac output preserved. Echo (10/19) with EF 45-50%, septal-lateral dyssynchrony, RV looked ok.  PYP scan was not suggestive of transthyretin amyloidosis.  Echo in 1/21 with EF 55-60%, normal RV.  RHC/LHC in 4/21 with no significant CAD, normal filling pressures and cardiac output.  NYHA class III symptoms chronically.  She is mildly volume overloaded on exam.   - Think she could benefit from more fluid off at HD.  2. COPD: She no longer smokes. Restrictive PFTs. High rest CT negative for ILD. Suspect restrictive PFTs are related to body habitus.  3. HTN: BP mildly elevated at times but drops low with HD.  Would not add antihypertensives.  4. ESRD: HD per renal.  5. Atrial fibrillation: Paroxysmal.  She is in NSR today.  -  Continue apixaban 5 mg bid, CBC today.  6. Pulmonary HTN: Mild PH on 4/21 RHC.  7. Hyperlipidemia: Good lipids in 9/21.   Followup 6 months with NP/PA.   Loralie Champagne, MD  01/13/2020

## 2020-01-22 ENCOUNTER — Other Ambulatory Visit (HOSPITAL_COMMUNITY): Payer: Self-pay | Admitting: *Deleted

## 2020-01-22 DIAGNOSIS — N186 End stage renal disease: Secondary | ICD-10-CM | POA: Diagnosis not present

## 2020-01-22 DIAGNOSIS — Z992 Dependence on renal dialysis: Secondary | ICD-10-CM | POA: Diagnosis not present

## 2020-01-22 DIAGNOSIS — I129 Hypertensive chronic kidney disease with stage 1 through stage 4 chronic kidney disease, or unspecified chronic kidney disease: Secondary | ICD-10-CM | POA: Diagnosis not present

## 2020-01-24 DIAGNOSIS — Z992 Dependence on renal dialysis: Secondary | ICD-10-CM | POA: Diagnosis not present

## 2020-01-24 DIAGNOSIS — D689 Coagulation defect, unspecified: Secondary | ICD-10-CM | POA: Diagnosis not present

## 2020-01-24 DIAGNOSIS — N186 End stage renal disease: Secondary | ICD-10-CM | POA: Diagnosis not present

## 2020-01-24 DIAGNOSIS — D519 Vitamin B12 deficiency anemia, unspecified: Secondary | ICD-10-CM | POA: Diagnosis not present

## 2020-01-24 DIAGNOSIS — N2581 Secondary hyperparathyroidism of renal origin: Secondary | ICD-10-CM | POA: Diagnosis not present

## 2020-01-24 DIAGNOSIS — D509 Iron deficiency anemia, unspecified: Secondary | ICD-10-CM | POA: Diagnosis not present

## 2020-01-24 DIAGNOSIS — D631 Anemia in chronic kidney disease: Secondary | ICD-10-CM | POA: Diagnosis not present

## 2020-01-30 DIAGNOSIS — M5136 Other intervertebral disc degeneration, lumbar region: Secondary | ICD-10-CM | POA: Diagnosis not present

## 2020-01-30 DIAGNOSIS — M48062 Spinal stenosis, lumbar region with neurogenic claudication: Secondary | ICD-10-CM | POA: Diagnosis not present

## 2020-01-30 DIAGNOSIS — G894 Chronic pain syndrome: Secondary | ICD-10-CM | POA: Diagnosis not present

## 2020-01-30 DIAGNOSIS — M259 Joint disorder, unspecified: Secondary | ICD-10-CM | POA: Diagnosis not present

## 2020-02-06 ENCOUNTER — Other Ambulatory Visit: Payer: Self-pay

## 2020-02-06 ENCOUNTER — Ambulatory Visit: Payer: HMO | Admitting: Internal Medicine

## 2020-02-06 ENCOUNTER — Encounter: Payer: Self-pay | Admitting: Internal Medicine

## 2020-02-06 VITALS — BP 138/72 | HR 104 | Ht 62.0 in | Wt 196.5 lb

## 2020-02-06 DIAGNOSIS — Z794 Long term (current) use of insulin: Secondary | ICD-10-CM

## 2020-02-06 DIAGNOSIS — E1165 Type 2 diabetes mellitus with hyperglycemia: Secondary | ICD-10-CM | POA: Diagnosis not present

## 2020-02-06 LAB — POCT GLYCOSYLATED HEMOGLOBIN (HGB A1C): Hemoglobin A1C: 8.2 % — AB (ref 4.0–5.6)

## 2020-02-06 LAB — GLUCOSE, POCT (MANUAL RESULT ENTRY): POC Glucose: 226 mg/dl — AB (ref 70–99)

## 2020-02-06 MED ORDER — TRESIBA FLEXTOUCH 100 UNIT/ML ~~LOC~~ SOPN
18.0000 [IU] | PEN_INJECTOR | Freq: Every day | SUBCUTANEOUS | 6 refills | Status: DC
Start: 2020-02-06 — End: 2021-06-18

## 2020-02-06 MED ORDER — INSULIN PEN NEEDLE 32G X 4 MM MISC: 1.0000 | Freq: Four times a day (QID) | 3 refills | Status: DC

## 2020-02-06 MED ORDER — NOVOLOG FLEXPEN 100 UNIT/ML ~~LOC~~ SOPN: PEN_INJECTOR | SUBCUTANEOUS | 11 refills | Status: DC

## 2020-02-06 NOTE — Patient Instructions (Addendum)
-   Decrease Tresiba to  18 units daily  - Novolog 10 units with Breakfast and lunch and 12 units with Supper  - Novolog correctional insulin: ADD extra units on insulin to your meal-time Novolog dose if your blood sugars are higher than 170. Use the scale below to help guide you:  Blood sugar before meal Number of units to inject  Less than 170 0 unit  171 -  205 1 units  206 -  240 2 units  241-  275 3 units  276 - 310 4 units  311- 345 5 units  346- 380 6 units  381- 415 7 units  416- 450 8 units       HOW TO TREAT LOW BLOOD SUGARS (Blood sugar LESS THAN 70 MG/DL)  Please follow the RULE OF 15 for the treatment of hypoglycemia treatment (when your (blood sugars are less than 70 mg/dL)    STEP 1: Take 15 grams of carbohydrates when your blood sugar is low, which includes:   3-4 GLUCOSE TABS  OR  3-4 OZ OF JUICE OR REGULAR SODA OR  ONE TUBE OF GLUCOSE GEL     STEP 2: RECHECK blood sugar in 15 MINUTES STEP 3: If your blood sugar is still low at the 15 minute recheck --> then, go back to STEP 1 and treat AGAIN with another 15 grams of carbohydrates.

## 2020-02-06 NOTE — Progress Notes (Signed)
Name: Stephanie Woodward  Age/ Sex: 79 y.o., female   MRN/ DOB: [id number], 1940-03-18     PCP: Alroy Dust, L.Marlou Sa, MD   Reason for Endocrinology Evaluation: Type 2 Diabetes Mellitus  Initial Endocrine Consultative Visit: 02/02/2019    PATIENT IDENTIFIER: Stephanie Woodward is a 79 y.o. female with a past medical history ofT2DM, HTN, ESRD on HD and CHF . The patient has followed with Endocrinology clinic since 02/02/2019 for consultative assistance with management of her diabetes.  DIABETIC HISTORY:  Stephanie Woodward was diagnosed with T2DM in 2005. She has been on oral glycemic agents in the past, recalls metformin but does not recall the rest. Her hemoglobin A1c has ranged from 8.8% in 2018, peaking at 8.9% in 2020.  On her initial visit to our clinic she had an A1c of 8.9%, she was on tresiba and Novolog. We continued the regimen but adjusted her doses.    SUBJECTIVE:   During the last visit (07/02/2019): A1c 7.5 %. We continued MDI regimen   Today (02/06/2020): Stephanie Woodward is here for a  follow up on diabetes. She is accompanied by her daughter [name]  today . She checks her blood sugars frequently through CGM.   The patient has  had hypoglycemic episodes since the last clinic visit.    She doesn't always gets to take prandial insulin with lunch on  Dialysis days as she eats within 2 hours.  ( Tuesday, Thursday and Saturday )  HOME DIABETES REGIMEN:  Tresiba 20 units  Novolog 10  units breakfast and ,12 units with supper  CF : Novolog ( BG-135/35)      Statin: Yes ACE-I/ARB: No     CONTINUOUS GLUCOSE MONITORING RECORD INTERPRETATION    Dates of Recording: 12/2-12/15/2021  Sensor description:freestyle  Results statistics:   CGM use % of time 49  Average and SD 175/42  Time in range      59  %  % Time Above 180 25  % Time above 250 13  % Time Below target 1     Glycemic patterns summary: Hyperglycemia after supper and remains over night at times  Hyperglycemic  episodes  Post supper   Hypoglycemic episodes occurred overnight and sometimes in the afternoon  Overnight periods:      DIABETIC COMPLICATIONS: Microvascular complications:   ESRD on HD   Denies: neuropathy , retinopathy   Last eye exam: Completed 2019  Macrovascular complications:   CHF  Denies: CAD, PVD, CVA  HISTORY:  Past Medical History:  Past Medical History:  Diagnosis Date  . Arthritis   . Asthma   . Atrial fibrillation ([place])   . CHF (congestive heart failure) ([place])   . CKD (chronic kidney disease), stage III ([place])   . COPD (chronic obstructive pulmonary disease) ([place])   . Diabetes mellitus    INSULIN DEPENDENT  . Gout   . Heart murmur    no issues per pt  . History of kidney stones   . Hyperlipemia   . Hypertension   . Psoriasis   . Renal disorder    congenital  . Single kidney   . Sleep apnea    doesn't use the Cpap   Past Surgical History:  Past Surgical History:  Procedure Laterality Date  . ABDOMINAL HYSTERECTOMY    . AV FISTULA PLACEMENT Left 11/17/2018   Procedure: BRACHIO-CEPHALIC ARTERIOVENOUS (AV) FISTULA CREATION IN LEFT ARM;  Surgeon: Marty Heck, MD;  Location: [facility];  Service: Vascular;  Laterality: Left;  .  CHOLECYSTECTOMY    . INSERTION OF DIALYSIS CATHETER Right 01/26/2019   Procedure: INSERTION OF DIALYSIS CATHETER, right internal jugular;  Surgeon: Angelia Mould, MD;  Location: [facility];  Service: Vascular;  Laterality: Right;  . LEFT AND RIGHT HEART CATHETERIZATION WITH CORONARY ANGIOGRAM N/A 11/29/2013   Procedure: LEFT AND RIGHT HEART CATHETERIZATION WITH CORONARY ANGIOGRAM;  Surgeon: Jacolyn Reedy, MD;  Location: [facility] CATH LAB;  Service: Cardiovascular;  Laterality: N/A;  . RIGHT HEART CATH N/A 11/23/2017   Procedure: RIGHT HEART CATH;  Surgeon: Larey Dresser, MD;  Location: [facility] CV [facility];  Service: Cardiovascular;  Laterality: N/A;  . RIGHT/LEFT HEART CATH AND CORONARY ANGIOGRAPHY N/A 06/15/2019    Procedure: RIGHT/LEFT HEART CATH AND CORONARY ANGIOGRAPHY;  Surgeon: Larey Dresser, MD;  Location: [facility];  Service: Cardiovascular;  Laterality: N/A;    Social History:  reports that she quit smoking about 10 years ago. Her smoking use included cigarettes. She has a 20.00 pack-year smoking history. She has never used smokeless tobacco. She reports current alcohol use. She reports previous drug use. Family History:  Family History  Problem Relation Age of Onset  . Lupus Daughter   . Cancer Mother 66  . CVA Father 107  . Cancer Brother 51     HOME MEDICATIONS: Allergies as of 02/06/2020      Reactions   Eggs Or Egg-derived Products Nausea And Vomiting   Lisinopril Nausea And Vomiting   Penicillins Nausea And Vomiting   Has patient had a PCN reaction causing immediate rash, facial/tongue/throat swelling, SOB or lightheadedness with hypotension: No Has patient had a PCN reaction causing severe rash involving mucus membranes or skin necrosis: No Has patient had a PCN reaction that required hospitalization: No Has patient had a PCN reaction occurring within the last 10 years: No If all of the above answers are "NO", then may proceed with Cephalosporin use.   Other Other (See Comments)      Medication List       Accurate as of February 06, 2020 11:06 AM. If you have any questions, ask your nurse or doctor.        albuterol 108 (90 Base) MCG/ACT inhaler Commonly known as: VENTOLIN HFA Inhale 2 puffs into the lungs every 4 (four) hours as needed for shortness of breath.   apixaban 5 MG Tabs tablet Commonly known as: Eliquis Take 1 tablet (5 mg total) by mouth 2 (two) times daily.   atorvastatin 80 MG tablet Commonly known as: LIPITOR Take 80 mg by mouth every evening.   B-D ULTRAFINE III SHORT PEN 31G X 8 MM Misc Generic drug: Insulin Pen Needle Inject into the skin 2 (two) times daily.   betamethasone dipropionate 0.05 % cream Apply topically.   Dialyvite  800-Zinc 15 0.8 MG Tabs Take 1 tablet by mouth every evening.   fluticasone 0.005 % ointment Commonly known as: CUTIVATE Apply 1 application topically 2 (two) times daily as needed (skin irritation/rash).   FreeStyle Libre 14 Day Sensor Misc 1 every 14 days E11.65   FreeStyle [employer] 1 kit by Does not apply route daily. Use as instructed E11.65   glucose blood test strip Use to check blood sugar 4 times a day. E11.65   hydrALAZINE 100 MG tablet Commonly known as: APRESOLINE TAKE 1 TABLET BY MOUTH THREE TIMES DAILY   insulin aspart 100 UNIT/ML injection Commonly known as: novoLOG Inject 10 Units into the skin 2 (two) times daily. 12 units  w dinner   isosorbide mononitrate 30 MG 24 hr tablet Commonly known as: IMDUR Take 1 tablet by mouth once daily   ketoconazole 2 % cream Commonly known as: NIZORAL Apply topically.   latanoprost 0.005 % ophthalmic solution Commonly known as: XALATAN Place 1 drop into both eyes at bedtime.   metoprolol succinate 25 MG 24 hr tablet Commonly known as: TOPROL-XL Take 2 tablets (50 mg total) by mouth every morning AND 1 tablet (25 mg total) every evening.   Narcan 4 MG/0.1ML Liqd nasal spray kit Generic drug: naloxone Place 1 spray into the nose See admin instructions. Call EMS at first sign of opioid overdose. Place on spray in nose.  If no response, then place one spray ing the opposite side of the nose.   ondansetron 4 MG tablet Commonly known as: ZOFRAN Take 4 mg by mouth 2 (two) times daily as needed for nausea.   pantoprazole 40 MG tablet Commonly known as: PROTONIX Take 40 mg by mouth every evening.   torsemide 20 MG tablet Commonly known as: DEMADEX Take 20 mg by mouth daily. 2 tabs   traMADol 50 MG tablet Commonly known as: ULTRAM Take 50 mg by mouth every 6 (six) hours as needed for severe pain (for pain.).   Tyler Aas FlexTouch 100 UNIT/ML FlexTouch Pen Generic drug: insulin degludec Inject 0.2 mLs (20 Units  total) into the skin daily.   triamcinolone 0.1 % Commonly known as: KENALOG Apply 1 application topically 2 (two) times daily as needed (skin rash/irritation.).        OBJECTIVE:   Vital Signs: BP 138/72   Pulse (!) 104   Ht _0  (1.575 m)   Wt 196 lb 8 oz (89.1 kg)   SpO2 93%   BMI 35.94 kg/m   Wt Readings from Last 3 Encounters:  02/06/20 196 lb 8 oz (89.1 kg)  01/11/20 202 lb (91.6 kg)  10/31/19 196 lb 9.6 oz (89.2 kg)     Exam: General: Pt appears well and is in NAD  Lungs: Clear with good BS bilat with no rales, rhonchi, or wheezes  Heart: RRR with normal S1 and S2 and no gallops; no murmurs; no rub  Extremities: No pretibial edema.  Neuro: MS is good with appropriate affect, pt is alert and Ox3    DM foot exam:04/04/2019      The skin of the feet is intact without sores or ulcerations. Pt has a chronic rash on the right medial aspect of leg  The pedal pulses are 1+ on right and 1+ on left. The sensation is intact to a screening 5.07, 10 gram monofilament bilaterally        DATA REVIEWED:  Lab Results  Component Value Date   HGBA1C 7.5 (A) 07/02/2019   HGBA1C 7.1 (A) 04/04/2019   HGBA1C 8.9 (H) 01/24/2019   Lab Results  Component Value Date   LDLCALC 64 10/24/2019   CREATININE 5.26 (H) 10/24/2019     Lab Results  Component Value Date   CHOL 134 10/24/2019   HDL 53 10/24/2019   LDLCALC 64 10/24/2019   TRIG 85 10/24/2019   CHOLHDL 2.5 10/24/2019       CGM 217  FS 226   ASSESSMENT / PLAN / RECOMMENDATIONS:   1) Type 2 Diabetes Mellitus, Sub-optimally controlled, With ESRD  on HD- Most recent A1c of 8.2 %. Goal A1c < 7.5 %.    - A1c up from 7.5 % -  In review of the CGM download,  her BG's have been showing severe excursions from 40's to 400's but for the most part they are within range - She has been noted with hyperglycemia at night following dialysis, there may be some forgetfulness in taking prandial or extra CHO intake . Daughter  will put reminders on the phone .  - I am going to reduce her Tresiba dose, as she has bee noted with hypoglycemia at night and at times tight BG's   MEDICATIONS: - Decrease  Tresiba 18 units daily  - Continue Novolog 10 units with Breakfast and lunch and 12 units with Supper - CF : Novolog (BG-135/35)   EDUCATION / INSTRUCTIONS:  BG monitoring instructions: Patient is instructed to check her blood sugars 4 times a day, before meals and bedtime .  Call [place] [facility] clinic if: BG persistently < 70  . I reviewed the Rule of 15 for the treatment of hypoglycemia in detail with the patient. Literature supplied.    F/U in 4 months    Signed electronically by: Mack Guise, MD  [facility] End[facility]  [facility] [address] [place], [place] [address] Phone: [phone] FAX: [phone]   CC: Aurea [address]Marlou Sa, [address] [place] [address] Phone: [phone]  Fax: [phone]  Return to Endocrinology clinic as below: Future Appointments  Date Time Provider [place]  02/06/2020 11:10 AM Ajia Chadderdon, Melanie Crazier, MD [facility] None  02/08/2020  4:45 PM [facility]  05/28/2020 10:00 AM Dannielle Karvonen, RN [facility] None

## 2020-02-08 ENCOUNTER — Ambulatory Visit: Payer: HMO | Attending: Internal Medicine

## 2020-02-08 DIAGNOSIS — Z23 Encounter for immunization: Secondary | ICD-10-CM

## 2020-02-08 NOTE — Progress Notes (Signed)
   Covid-19 Vaccination Clinic  Name:  Stephanie Woodward    MRN: [id number] DOB: [birth year]  02/08/2020  [name] was observed post Covid-19 immunization for 15 minutes without incident. She was provided with Vaccine Information Sheet and instruction to access the V-Safe system.   [name] was instructed to call 911 with any severe reactions post vaccine: Marland Kitchen Difficulty breathing  . Swelling of face and throat  . A fast heartbeat  . A bad rash all over body  . Dizziness and weakness   Immunizations Administered    Name Date Dose VIS Date Route   Pfizer COVID-19 Vaccine 02/08/2020  4:47 PM 0.3 mL 12/12/2019 Intramuscular   Manufacturer: [place]   Lot: [id number]   [place]: [id number]

## 2020-02-22 DIAGNOSIS — Z992 Dependence on renal dialysis: Secondary | ICD-10-CM | POA: Diagnosis not present

## 2020-02-22 DIAGNOSIS — N186 End stage renal disease: Secondary | ICD-10-CM | POA: Diagnosis not present

## 2020-02-22 DIAGNOSIS — I129 Hypertensive chronic kidney disease with stage 1 through stage 4 chronic kidney disease, or unspecified chronic kidney disease: Secondary | ICD-10-CM | POA: Diagnosis not present

## 2020-02-24 DIAGNOSIS — N2581 Secondary hyperparathyroidism of renal origin: Secondary | ICD-10-CM | POA: Diagnosis not present

## 2020-02-24 DIAGNOSIS — N186 End stage renal disease: Secondary | ICD-10-CM | POA: Diagnosis not present

## 2020-02-24 DIAGNOSIS — D631 Anemia in chronic kidney disease: Secondary | ICD-10-CM | POA: Diagnosis not present

## 2020-02-24 DIAGNOSIS — E1122 Type 2 diabetes mellitus with diabetic chronic kidney disease: Secondary | ICD-10-CM | POA: Diagnosis not present

## 2020-02-24 DIAGNOSIS — D509 Iron deficiency anemia, unspecified: Secondary | ICD-10-CM | POA: Diagnosis not present

## 2020-02-24 DIAGNOSIS — Z992 Dependence on renal dialysis: Secondary | ICD-10-CM | POA: Diagnosis not present

## 2020-02-24 DIAGNOSIS — R52 Pain, unspecified: Secondary | ICD-10-CM | POA: Diagnosis not present

## 2020-02-25 DIAGNOSIS — M48062 Spinal stenosis, lumbar region with neurogenic claudication: Secondary | ICD-10-CM | POA: Diagnosis not present

## 2020-02-25 DIAGNOSIS — M5416 Radiculopathy, lumbar region: Secondary | ICD-10-CM | POA: Diagnosis not present

## 2020-02-25 DIAGNOSIS — M5136 Other intervertebral disc degeneration, lumbar region: Secondary | ICD-10-CM | POA: Diagnosis not present

## 2020-02-25 DIAGNOSIS — M259 Joint disorder, unspecified: Secondary | ICD-10-CM | POA: Diagnosis not present

## 2020-03-11 ENCOUNTER — Other Ambulatory Visit (HOSPITAL_COMMUNITY): Payer: HMO

## 2020-03-21 ENCOUNTER — Ambulatory Visit: Payer: HMO | Admitting: Emergency Medicine

## 2020-03-24 DIAGNOSIS — Z992 Dependence on renal dialysis: Secondary | ICD-10-CM | POA: Diagnosis not present

## 2020-03-24 DIAGNOSIS — N186 End stage renal disease: Secondary | ICD-10-CM | POA: Diagnosis not present

## 2020-03-24 DIAGNOSIS — I129 Hypertensive chronic kidney disease with stage 1 through stage 4 chronic kidney disease, or unspecified chronic kidney disease: Secondary | ICD-10-CM | POA: Diagnosis not present

## 2020-03-25 ENCOUNTER — Other Ambulatory Visit (HOSPITAL_COMMUNITY)
Admission: RE | Admit: 2020-03-25 | Discharge: 2020-03-25 | Disposition: A | Discharge status: Home / Self Care | Payer: HMO | Source: Ambulatory Visit | Attending: Pulmonary Disease | Admitting: Pulmonary Disease

## 2020-03-25 DIAGNOSIS — Z992 Dependence on renal dialysis: Secondary | ICD-10-CM | POA: Diagnosis not present

## 2020-03-25 DIAGNOSIS — U071 COVID-19: Secondary | ICD-10-CM | POA: Diagnosis not present

## 2020-03-25 DIAGNOSIS — D631 Anemia in chronic kidney disease: Secondary | ICD-10-CM | POA: Diagnosis not present

## 2020-03-25 DIAGNOSIS — N186 End stage renal disease: Secondary | ICD-10-CM | POA: Diagnosis not present

## 2020-03-25 DIAGNOSIS — N2581 Secondary hyperparathyroidism of renal origin: Secondary | ICD-10-CM | POA: Diagnosis not present

## 2020-03-25 DIAGNOSIS — R52 Pain, unspecified: Secondary | ICD-10-CM | POA: Diagnosis not present

## 2020-03-25 DIAGNOSIS — Z01812 Encounter for preprocedural laboratory examination: Secondary | ICD-10-CM | POA: Diagnosis present

## 2020-03-25 DIAGNOSIS — D509 Iron deficiency anemia, unspecified: Secondary | ICD-10-CM | POA: Diagnosis not present

## 2020-03-25 LAB — SARS CORONAVIRUS 2 (TAT 6-24 HRS): SARS Coronavirus 2: POSITIVE — AB

## 2020-03-26 ENCOUNTER — Telehealth: Payer: Self-pay | Admitting: Physician Assistant

## 2020-03-26 NOTE — Telephone Encounter (Signed)
Called to discuss with patient about COVID-19 symptoms and the use of one of the available treatments for those with mild to moderate Covid symptoms and at a high risk of hospitalization.  Pt appears to qualify for outpatient treatment due to co-morbid conditions and/or a member of an at-risk group in accordance with the FDA Emergency Use Authorization.    Symptom onset: about 2 weeks ago Vaccinated: yes Booster? yes Immunocompromised? No Qualifiers: ESRD on HD, DM, HTN, CHF, BMI > 25  Pt is not qualified for the monoclonal antibody infusion or antiviral treatment as symptoms started >7 days ago. Symptoms tier reviewed as well as criteria for ending isolation. Preventative practices reviewed. Patient verbalized understanding.  [place], [place]  03/26/2020 8:22 AM

## 2020-03-28 DIAGNOSIS — U071 COVID-19: Secondary | ICD-10-CM | POA: Diagnosis not present

## 2020-03-31 DIAGNOSIS — U071 COVID-19: Secondary | ICD-10-CM | POA: Diagnosis not present

## 2020-04-02 DIAGNOSIS — N186 End stage renal disease: Secondary | ICD-10-CM | POA: Diagnosis not present

## 2020-04-09 ENCOUNTER — Other Ambulatory Visit: Payer: Self-pay

## 2020-04-09 ENCOUNTER — Encounter: Payer: Self-pay | Admitting: Emergency Medicine

## 2020-04-09 ENCOUNTER — Ambulatory Visit: Payer: HMO | Admitting: Emergency Medicine

## 2020-04-09 DIAGNOSIS — R0602 Shortness of breath: Secondary | ICD-10-CM

## 2020-04-09 MED ORDER — BUDESONIDE-FORMOTEROL FUMARATE 160-4.5 MCG/ACT IN AERO: 2.0000 | INHALATION_SPRAY | Freq: Two times a day (BID) | RESPIRATORY_TRACT | 0 refills | Status: DC

## 2020-04-09 NOTE — Patient Instructions (Signed)
We will try starting Symbicort 2 puffs twice a day.  Rinse and gargle after you use this medication.  Keep track of whether it helps your breathing.  If it does then we will send a prescription to your pharmacy so that we can continue it going forward. Keep albuterol available to use 2 puffs when you needed for shortness of breath, chest tightness, wheezing. Your sleep study showed only very mild evidence for sleep apnea.  We do not need to start CPAP at this time. Continue to follow with Dr. Aundra Dubin as planned. Optimal fluid management with your dialysis. Follow with Dr Lamonte Sakai in 3 months or sooner if you have any problems.

## 2020-04-09 NOTE — Assessment & Plan Note (Signed)
A contributor to her deconditioning.  Her sleep study showed only mild sleep apnea, AHI 3.0.  No clear indication for CPAP.

## 2020-04-09 NOTE — Assessment & Plan Note (Signed)
Her pulmonary function testing have been mostly consistent with restriction.  I suspect that her dyspnea is multifactorial and related to volume status, deconditioning, her chronic underlying cardiac disease.  Her volume status and CHF appear to be well compensated.  I wanted to try her empirically on bronchodilators to see if she would get benefit but she was unable to tolerate either Spiriva or Stiolto.  I will try Symbicort since she has gotten benefit and tolerated Advair in the past.  She will keep albuterol available to use if needed.  I think she would benefit from cardiopulmonary rehab.  The timing of this will be difficult because she has to go to dialysis 3 times a week.

## 2020-04-09 NOTE — Addendum Note (Signed)
Addended by: Gavin Potters R on: 04/09/2020 04:11 PM   Modules accepted: Orders

## 2020-04-09 NOTE — Progress Notes (Signed)
Subjective:    Patient ID: Stephanie Woodward, female    DOB: 12/29/1940, 80 y.o.   MRN: [id number]  HPI 80 year old woman, former smoker (20 pack years) with ESRD on HD since 2019, atrial fibrillation, hypertension, psoriasis, OSA not on CPAP.  Suspected mixed restriction and obstruction based on prior spirometry's, curve of her flow volume loop.  She is referred today for evaluation of progressive dyspnea. She describes progressive dyspnea over several years time. She is ok with sitting still, but will have dyspnea with even short walks. Sometimes assoc with chest heaviness. Weight has been stable and she is able to reach dry weight. Sometimes hears wheeze - rare w exertion. She has some cough, better over the last several months.   She has albuterol which she uses with exertion, not every day.  She is not on any scheduled bronchodilators.  She underwent right and left heart catheterization on 06/15/2019 which I have reviewed, shows no significant CAD, intact cardiac output, normal filling pressures and mild PAH.  Pulmonary function testing reviewed from 2016 and from 11/25/2017.  Most recent show a restricted pattern on spirometry with possible superimposed obstruction, normal TLC but decreased RV.  She was unable to do diffusion capacity.  ROV 04/09/20 --follow-up visit for 80 year old former smoker with end-stage renal disease on HD, A. fib, hypertension, psoriasis, OSA not on CPAP.  I saw her for exertional dyspnea in May.  She has reassuring right heart catheterization 06/15/2019 but has been in the ED with atrial fibrillation.  She has spirometry most consistent with restriction, no evidence of ILD on CT chest done 11/27/2017.  I started empiric Spiriva in May 2021, this was changed to [facility] in August. Unfortunately she was unable to take either one due to nausea. She states that she may have benefited from Advair remotely.   She reports today that she continues to have significant exertional  SOB, unable to walk any significant distance. Occasional cough, may sometimes hear wheeze at rest.  She believes that her fluid status is a bit better balanced, HD has increased her volume removal  We repeated her sleep study on 08/05/2019, showed an AHI of 3.0/h with moderate snoring and no significant desaturation   Review of Systems As per HPI      Objective:   Physical Exam  Vitals:   04/09/20 1523  BP: 130/68  Pulse: 70  Temp: (!) 97.4 F (36.3 C)  SpO2: 98%  Weight: 196 lb 3.2 oz (89 kg)  Height: 5\' 2"  (1.575 m)   Gen: Pleasant, obese woman, in no distress,  normal affect  ENT: No lesions,  mouth clear,  oropharynx clear w M3-4 airway, no postnasal drip  Neck: No JVD, no stridor  Lungs: No use of accessory muscles, no crackles or wheezing on normal respiration, no wheeze on forced expiration  Cardiovascular: RRR, heart sounds normal, no murmur or gallops, no peripheral edema  Musculoskeletal: No deformities, no cyanosis or clubbing  Neuro: alert, awake, non focal  Skin: Warm, no lesions or rash      Assessment & Plan:  SOB (shortness of breath) Her pulmonary function testing have been mostly consistent with restriction.  I suspect that her dyspnea is multifactorial and related to volume status, deconditioning, her chronic underlying cardiac disease.  Her volume status and CHF appear to be well compensated.  I wanted to try her empirically on bronchodilators to see if she would get benefit but she was unable to tolerate either Spiriva or Stiolto.  I  will try Symbicort since she has gotten benefit and tolerated Advair in the past.  She will keep albuterol available to use if needed.  I think she would benefit from cardiopulmonary rehab.  The timing of this will be difficult because she has to go to dialysis 3 times a week.  Obesity A contributor to her deconditioning.  Her sleep study showed only mild sleep apnea, AHI 3.0.  No clear indication for CPAP.  Baltazar Apo, MD, PhD 04/09/2020, 3:54 PM  Pulmonary and Critical Care [phone] or if no answer [phone]

## 2020-04-21 DIAGNOSIS — I129 Hypertensive chronic kidney disease with stage 1 through stage 4 chronic kidney disease, or unspecified chronic kidney disease: Secondary | ICD-10-CM | POA: Diagnosis not present

## 2020-04-21 DIAGNOSIS — Z992 Dependence on renal dialysis: Secondary | ICD-10-CM | POA: Diagnosis not present

## 2020-04-21 DIAGNOSIS — N186 End stage renal disease: Secondary | ICD-10-CM | POA: Diagnosis not present

## 2020-04-22 DIAGNOSIS — D631 Anemia in chronic kidney disease: Secondary | ICD-10-CM | POA: Diagnosis not present

## 2020-04-22 DIAGNOSIS — D509 Iron deficiency anemia, unspecified: Secondary | ICD-10-CM | POA: Diagnosis not present

## 2020-04-22 DIAGNOSIS — N186 End stage renal disease: Secondary | ICD-10-CM | POA: Diagnosis not present

## 2020-04-22 DIAGNOSIS — R52 Pain, unspecified: Secondary | ICD-10-CM | POA: Diagnosis not present

## 2020-04-22 DIAGNOSIS — N2581 Secondary hyperparathyroidism of renal origin: Secondary | ICD-10-CM | POA: Diagnosis not present

## 2020-04-22 DIAGNOSIS — Z992 Dependence on renal dialysis: Secondary | ICD-10-CM | POA: Diagnosis not present

## 2020-04-29 DIAGNOSIS — N186 End stage renal disease: Secondary | ICD-10-CM | POA: Diagnosis not present

## 2020-04-29 DIAGNOSIS — Z992 Dependence on renal dialysis: Secondary | ICD-10-CM | POA: Diagnosis not present

## 2020-04-29 DIAGNOSIS — D509 Iron deficiency anemia, unspecified: Secondary | ICD-10-CM | POA: Diagnosis not present

## 2020-04-29 DIAGNOSIS — N2581 Secondary hyperparathyroidism of renal origin: Secondary | ICD-10-CM | POA: Diagnosis not present

## 2020-04-29 DIAGNOSIS — R52 Pain, unspecified: Secondary | ICD-10-CM | POA: Diagnosis not present

## 2020-04-29 DIAGNOSIS — D631 Anemia in chronic kidney disease: Secondary | ICD-10-CM | POA: Diagnosis not present

## 2020-04-30 ENCOUNTER — Encounter: Payer: Self-pay | Admitting: Gastroenterology

## 2020-04-30 DIAGNOSIS — G894 Chronic pain syndrome: Secondary | ICD-10-CM | POA: Diagnosis not present

## 2020-05-06 DIAGNOSIS — N2581 Secondary hyperparathyroidism of renal origin: Secondary | ICD-10-CM | POA: Diagnosis not present

## 2020-05-06 DIAGNOSIS — D509 Iron deficiency anemia, unspecified: Secondary | ICD-10-CM | POA: Diagnosis not present

## 2020-05-06 DIAGNOSIS — D631 Anemia in chronic kidney disease: Secondary | ICD-10-CM | POA: Diagnosis not present

## 2020-05-06 DIAGNOSIS — Z992 Dependence on renal dialysis: Secondary | ICD-10-CM | POA: Diagnosis not present

## 2020-05-06 DIAGNOSIS — N186 End stage renal disease: Secondary | ICD-10-CM | POA: Diagnosis not present

## 2020-05-12 ENCOUNTER — Other Ambulatory Visit: Payer: Self-pay

## 2020-05-13 DIAGNOSIS — N186 End stage renal disease: Secondary | ICD-10-CM | POA: Diagnosis not present

## 2020-05-13 DIAGNOSIS — Z992 Dependence on renal dialysis: Secondary | ICD-10-CM | POA: Diagnosis not present

## 2020-05-13 DIAGNOSIS — R52 Pain, unspecified: Secondary | ICD-10-CM | POA: Diagnosis not present

## 2020-05-13 DIAGNOSIS — N2581 Secondary hyperparathyroidism of renal origin: Secondary | ICD-10-CM | POA: Diagnosis not present

## 2020-05-13 DIAGNOSIS — D631 Anemia in chronic kidney disease: Secondary | ICD-10-CM | POA: Diagnosis not present

## 2020-05-13 DIAGNOSIS — D509 Iron deficiency anemia, unspecified: Secondary | ICD-10-CM | POA: Diagnosis not present

## 2020-05-15 ENCOUNTER — Ambulatory Visit: Payer: HMO | Admitting: Gastroenterology

## 2020-05-16 ENCOUNTER — Other Ambulatory Visit: Payer: Self-pay

## 2020-05-16 ENCOUNTER — Ambulatory Visit: Payer: HMO | Admitting: Gastroenterology

## 2020-05-16 ENCOUNTER — Encounter: Payer: Self-pay | Admitting: Gastroenterology

## 2020-05-16 VITALS — BP 110/70 | HR 84 | Ht 62.0 in | Wt 192.0 lb

## 2020-05-16 DIAGNOSIS — R110 Nausea: Secondary | ICD-10-CM | POA: Diagnosis not present

## 2020-05-16 NOTE — Patient Instructions (Signed)
If you are age 78 or older, your body mass index should be between 23-30. Your Body mass index is 35.12 kg/m. If this is out of the aforementioned range listed, please consider follow up with your Primary Care Provider.  If you are age 4 or younger, your body mass index should be between 19-25. Your Body mass index is 35.12 kg/m. If this is out of the aformentioned range listed, please consider follow up with your Primary Care Provider.   You will be contacted by [facility] in the next 2 days to arrange a Upper GI series.  The number on your caller ID will be [phone], please answer when they call.  If you have not heard from them in 2 days please call [phone] to schedule.    Take Zofran before bed and in the morning.  Eat Small frequent meals , avoid eating late at night.  Thank you for choosing me and [facility].  Alonza Bogus, PA-C

## 2020-05-16 NOTE — Progress Notes (Addendum)
05/16/2020 Stephanie Woodward [id number] 03-12-1940   HISTORY OF PRESENT ILLNESS: This is a 80 year old female with a past medical history of longstanding insulin-dependent diabetes mellitus, COPD, end-stage renal disease on dialysis Tuesday Thursday and Saturday, history of atrial fibrillation on Eliquis, arthritis, hypertension, hyperlipidemia, gout, sleep apnea.  She presents here today at the request of Dr. Hollie Salk for evaluation regarding nausea and history of H. pylori.  She does me that she has never had EGD or colonoscopy in the past.  She says that she was diagnosed with H. pylori probably 11 years ago and remembers taking treatment for that, but does not recall any follow-up evaluation to confirm eradication.  Her only complaint is nausea.  She says that she has been very nauseated on and off since January.  She says that it typically happens during the night and when she wakes up in the morning.  She says that she does not feel nauseous after eating.  Her appetite is still good.  She is on pantoprazole 40 mg daily and denies any overt heartburn or reflux symptoms.  She denies abdominal pain.  She says that she has vomited only one time and it was a yellow liquid material.  She says that overall she moves her bowels well.  She denies any rectal bleeding.  Her daughter was present for the visit via phone call on speaker phone.  She denies any weight loss.  She was given Zofran to use as needed and that does help some, but does not completely relieve the nausea.  She recalls nausea being one of her symptoms when she had H. pylori in the past.  Past Medical History:  Diagnosis Date  . Arthritis   . Asthma   . Atrial fibrillation ([place])   . CHF (congestive heart failure) ([place])   . CKD (chronic kidney disease), stage III ([place])   . COPD (chronic obstructive pulmonary disease) ([place])   . Diabetes mellitus    INSULIN DEPENDENT  . Gout   . Heart murmur    no issues per pt  . History of kidney  stones   . Hyperlipemia   . Hypertension   . Psoriasis   . Renal disorder    congenital  . Single kidney   . Sleep apnea    doesn't use the Cpap   Past Surgical History:  Procedure Laterality Date  . ABDOMINAL HYSTERECTOMY    . AV FISTULA PLACEMENT Left 11/17/2018   Procedure: BRACHIO-CEPHALIC ARTERIOVENOUS (AV) FISTULA CREATION IN LEFT ARM;  Surgeon: Marty Heck, MD;  Location: [facility];  Service: Vascular;  Laterality: Left;  . CHOLECYSTECTOMY    . INSERTION OF DIALYSIS CATHETER Right 01/26/2019   Procedure: INSERTION OF DIALYSIS CATHETER, right internal jugular;  Surgeon: Angelia Mould, MD;  Location: [facility];  Service: Vascular;  Laterality: Right;  . LEFT AND RIGHT HEART CATHETERIZATION WITH CORONARY ANGIOGRAM N/A 11/29/2013   Procedure: LEFT AND RIGHT HEART CATHETERIZATION WITH CORONARY ANGIOGRAM;  Surgeon: Jacolyn Reedy, MD;  Location: [facility] CATH LAB;  Service: Cardiovascular;  Laterality: N/A;  . RIGHT HEART CATH N/A 11/23/2017   Procedure: RIGHT HEART CATH;  Surgeon: Larey Dresser, MD;  Location: [facility] CV LAB;  Service: Cardiovascular;  Laterality: N/A;  . RIGHT/LEFT HEART CATH AND CORONARY ANGIOGRAPHY N/A 06/15/2019   Procedure: RIGHT/LEFT HEART CATH AND CORONARY ANGIOGRAPHY;  Surgeon: Larey Dresser, MD;  Location: [facility] CV LAB;  Service: Cardiovascular;  Laterality: N/A;    reports that  she quit smoking about 11 years ago. Her smoking use included cigarettes. She has a 20.00 pack-year smoking history. She has never used smokeless tobacco. She reports current alcohol use. She reports previous drug use. family history includes CVA (age of onset: 53) in her father; Cancer (age of onset: 60) in her brother; Cancer (age of onset: 34) in her mother; Lupus in her daughter. Allergies  Allergen Reactions  . Eggs Or Egg-Derived Products Nausea And Vomiting  . Lisinopril Nausea And Vomiting  . Penicillins Nausea And Vomiting    Has patient had a PCN reaction  causing immediate rash, facial/tongue/throat swelling, SOB or lightheadedness with hypotension: No Has patient had a PCN reaction causing severe rash involving mucus membranes or skin necrosis: No Has patient had a PCN reaction that required hospitalization: No Has patient had a PCN reaction occurring within the last 10 years: No If all of the above answers are "NO", then may proceed with Cephalosporin use.   . Other Other (See Comments)      Outpatient Encounter Medications as of 05/16/2020  Medication Sig  . albuterol (PROVENTIL HFA;VENTOLIN HFA) 108 (90 BASE) MCG/ACT inhaler Inhale 2 puffs into the lungs every 4 (four) hours as needed for shortness of breath.  Marland Kitchen apixaban (ELIQUIS) 5 MG TABS tablet Take 1 tablet (5 mg total) by mouth 2 (two) times daily.  Marland Kitchen atorvastatin (LIPITOR) 80 MG tablet Take 80 mg by mouth every evening.   . B Complex-C-Zn-Folic Acid (DIALYVITE 580-DXIP 15) 0.8 MG TABS Take 1 tablet by mouth every evening.   . betamethasone dipropionate 0.05 % cream Apply topically.  . budesonide-formoterol (SYMBICORT) 160-4.5 MCG/ACT inhaler Inhale 2 puffs into the lungs 2 (two) times daily.  . budesonide-formoterol (SYMBICORT) 160-4.5 MCG/ACT inhaler Inhale 2 puffs into the lungs 2 (two) times daily.  . Continuous Blood Gluc Receiver (FREESTYLE LIBRE READER) DEVI 1 kit by Does not apply route daily. Use as instructed E11.65  . Continuous Blood Gluc Sensor (FREESTYLE LIBRE 14 DAY SENSOR) MISC 1 every 14 days E11.65  . fluticasone (CUTIVATE) 0.005 % ointment Apply 1 application topically 2 (two) times daily as needed (skin irritation/rash).   Marland Kitchen glucose blood test strip Use to check blood sugar 4 times a day. E11.65  . hydrALAZINE (APRESOLINE) 100 MG tablet TAKE 1 TABLET BY MOUTH THREE TIMES DAILY  . insulin aspart (NOVOLOG FLEXPEN) 100 UNIT/ML FlexPen Inject 10 Units into the skin 2 (two) times daily with breakfast and lunch AND 12 Units daily with supper.  . insulin degludec (TRESIBA  FLEXTOUCH) 100 UNIT/ML FlexTouch Pen Inject 18 Units into the skin daily.  . Insulin Pen Needle 32G X 4 MM MISC 1 Device by Does not apply route in the morning, at noon, in the evening, and at bedtime.  . isosorbide mononitrate (IMDUR) 30 MG 24 hr tablet Take 1 tablet by mouth once daily  . ketoconazole (NIZORAL) 2 % cream Apply topically.  . latanoprost (XALATAN) 0.005 % ophthalmic solution Place 1 drop into both eyes at bedtime.  . lidocaine (LINDAMANTLE) 3 % CREA cream APPLY SMALL AMOUNT TO ACCESS SITE (AVF) 1 HOUR BEFORE DIALYSIS. COVER WITH OCCLUSIVE DRESSING (SARAN WRAP)  . metoprolol succinate (TOPROL-XL) 25 MG 24 hr tablet Take 2 tablets (50 mg total) by mouth every morning AND 1 tablet (25 mg total) every evening.  Marland Kitchen NARCAN 4 MG/0.1ML LIQD nasal spray kit Place 1 spray into the nose See admin instructions. Call EMS at first sign of opioid overdose. Place on spray in  nose.  If no response, then place one spray ing the opposite side of the nose.  . ondansetron (ZOFRAN) 4 MG tablet Take 4 mg by mouth 2 (two) times daily as needed for nausea.   . pantoprazole (PROTONIX) 40 MG tablet Take 40 mg by mouth every evening.   . torsemide (DEMADEX) 20 MG tablet Take 20 mg by mouth daily. 2 tabs  . traMADol (ULTRAM) 50 MG tablet Take 50 mg by mouth every 6 (six) hours as needed for severe pain (for pain.).   Marland Kitchen triamcinolone cream (KENALOG) 0.1 % Apply 1 application topically 2 (two) times daily as needed (skin rash/irritation.).    No facility-administered encounter medications on file as of 05/16/2020.     REVIEW OF SYSTEMS  : All other systems reviewed and negative except where noted in the History of Present Illness.   PHYSICAL EXAM: BP 110/70   Pulse 84   Ht _0  (1.575 m)   Wt 192 lb (87.1 kg)   BMI 35.12 kg/m  General: Well developed AA female in no acute distress Head: Normocephalic and atraumatic Eyes:  Sclerae anicteric, conjunctiva pink. Ears: Normal auditory acuity Lungs: Clear  throughout to auscultation; no W/R/R. Heart: Regular rate and rhythm; no M/R/G. Abdomen: Soft, non-distended.  BS present.  Non-tender. Musculoskeletal: Symmetrical with no gross deformities  Skin: No lesions on visible extremities Extremities: No edema  Neurological: Alert oriented x 4, grossly non-focal Psychological:  Alert and cooperative. Normal mood and affect  ASSESSMENT AND PLAN: *80 year old female with isolated complaint of nausea.  She has multiple medical problems and I believe is high risk for any endoscopic procedures although she has never had any in the past.  She denies any other associated symptoms.  She is longstanding diabetic.  Nausea often occurs at nighttime and first thing in the morning.  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  We will plan for upper GI series to evaluate for any structural issues.  I advised her to begin taking her Zofran before bed and first thing in the morning rather than only using it when she is already nauseated.  She also reports history of H. pylori many years ago that was treated, but she does not ever remember follow-up testing to confirm eradication.  Will check H. pylori breath test.  If upper GI series looks good then we may proceed with gastric emptying scan.  She is on daily PPI and denies any heartburn/reflux symptoms. *Multiple medical problems   CC:  Mitchell, L.Marlou Sa, MD  CC:  Dr. Hollie Salk

## 2020-05-20 DIAGNOSIS — D509 Iron deficiency anemia, unspecified: Secondary | ICD-10-CM | POA: Diagnosis not present

## 2020-05-20 DIAGNOSIS — N2581 Secondary hyperparathyroidism of renal origin: Secondary | ICD-10-CM | POA: Diagnosis not present

## 2020-05-20 DIAGNOSIS — R52 Pain, unspecified: Secondary | ICD-10-CM | POA: Diagnosis not present

## 2020-05-20 DIAGNOSIS — Z992 Dependence on renal dialysis: Secondary | ICD-10-CM | POA: Diagnosis not present

## 2020-05-20 DIAGNOSIS — D631 Anemia in chronic kidney disease: Secondary | ICD-10-CM | POA: Diagnosis not present

## 2020-05-20 DIAGNOSIS — N186 End stage renal disease: Secondary | ICD-10-CM | POA: Diagnosis not present

## 2020-05-20 NOTE — Progress Notes (Signed)
Attending Physician's Attestation   I have reviewed the chart.   I agree with the Advanced Practitioner's note, impression, and recommendations with any updates as below.    Thad Osoria Mansouraty, MD [facility] Advanced Endoscopy Office # [phone]  

## 2020-05-21 ENCOUNTER — Telehealth: Payer: Self-pay | Admitting: Internal Medicine

## 2020-05-21 NOTE — Telephone Encounter (Signed)
Veronica with [facility] calling asking if we could call in the freestyle glucose meter (not freestyle libre, just freestyle) along with test strips to pharmacy:  [place], [place] Phone:  [phone]  Fax:  [phone]

## 2020-05-22 DIAGNOSIS — N186 End stage renal disease: Secondary | ICD-10-CM | POA: Diagnosis not present

## 2020-05-22 DIAGNOSIS — Z992 Dependence on renal dialysis: Secondary | ICD-10-CM | POA: Diagnosis not present

## 2020-05-22 DIAGNOSIS — I129 Hypertensive chronic kidney disease with stage 1 through stage 4 chronic kidney disease, or unspecified chronic kidney disease: Secondary | ICD-10-CM | POA: Diagnosis not present

## 2020-05-22 MED ORDER — FREESTYLE LITE TEST VI STRP: ORAL_STRIP | 5 refills | Status: DC

## 2020-05-22 MED ORDER — FREESTYLE LITE DEVI: 0 refills | Status: DC

## 2020-05-22 NOTE — Telephone Encounter (Signed)
Sent [employer]. However, it shows that this is not cover under patient's insurance.   As spoken to the nurse and patient earlier yesterday, patient is recommend to call her insurance.

## 2020-05-23 ENCOUNTER — Other Ambulatory Visit (HOSPITAL_COMMUNITY): Payer: Self-pay | Admitting: Cardiology

## 2020-05-24 DIAGNOSIS — N2581 Secondary hyperparathyroidism of renal origin: Secondary | ICD-10-CM | POA: Diagnosis not present

## 2020-05-24 DIAGNOSIS — N186 End stage renal disease: Secondary | ICD-10-CM | POA: Diagnosis not present

## 2020-05-24 DIAGNOSIS — Z992 Dependence on renal dialysis: Secondary | ICD-10-CM | POA: Diagnosis not present

## 2020-05-26 ENCOUNTER — Other Ambulatory Visit: Payer: Self-pay | Admitting: Nephrology

## 2020-05-26 DIAGNOSIS — M79662 Pain in left lower leg: Secondary | ICD-10-CM

## 2020-05-26 DIAGNOSIS — I824Y2 Acute embolism and thrombosis of unspecified deep veins of left proximal lower extremity: Secondary | ICD-10-CM

## 2020-05-27 DIAGNOSIS — D509 Iron deficiency anemia, unspecified: Secondary | ICD-10-CM | POA: Diagnosis not present

## 2020-05-27 DIAGNOSIS — N2581 Secondary hyperparathyroidism of renal origin: Secondary | ICD-10-CM | POA: Diagnosis not present

## 2020-05-27 DIAGNOSIS — N186 End stage renal disease: Secondary | ICD-10-CM | POA: Diagnosis not present

## 2020-05-27 DIAGNOSIS — D631 Anemia in chronic kidney disease: Secondary | ICD-10-CM | POA: Diagnosis not present

## 2020-05-27 DIAGNOSIS — E1122 Type 2 diabetes mellitus with diabetic chronic kidney disease: Secondary | ICD-10-CM | POA: Diagnosis not present

## 2020-05-27 DIAGNOSIS — Z992 Dependence on renal dialysis: Secondary | ICD-10-CM | POA: Diagnosis not present

## 2020-05-28 ENCOUNTER — Ambulatory Visit: Payer: HMO

## 2020-06-03 DIAGNOSIS — E1122 Type 2 diabetes mellitus with diabetic chronic kidney disease: Secondary | ICD-10-CM | POA: Diagnosis not present

## 2020-06-03 DIAGNOSIS — N2581 Secondary hyperparathyroidism of renal origin: Secondary | ICD-10-CM | POA: Diagnosis not present

## 2020-06-03 DIAGNOSIS — D631 Anemia in chronic kidney disease: Secondary | ICD-10-CM | POA: Diagnosis not present

## 2020-06-03 DIAGNOSIS — N186 End stage renal disease: Secondary | ICD-10-CM | POA: Diagnosis not present

## 2020-06-03 DIAGNOSIS — Z992 Dependence on renal dialysis: Secondary | ICD-10-CM | POA: Diagnosis not present

## 2020-06-08 ENCOUNTER — Other Ambulatory Visit: Payer: Self-pay

## 2020-06-08 ENCOUNTER — Emergency Department (HOSPITAL_COMMUNITY): Payer: HMO

## 2020-06-08 ENCOUNTER — Encounter (HOSPITAL_COMMUNITY): Payer: Self-pay

## 2020-06-08 ENCOUNTER — Emergency Department (HOSPITAL_COMMUNITY)
Admission: EM | Admit: 2020-06-08 | Discharge: 2020-06-08 | Disposition: A | Discharge status: Home / Self Care | Payer: HMO | Attending: Emergency Medicine | Admitting: Emergency Medicine

## 2020-06-08 DIAGNOSIS — Z87891 Personal history of nicotine dependence: Secondary | ICD-10-CM | POA: Diagnosis not present

## 2020-06-08 DIAGNOSIS — I130 Hypertensive heart and chronic kidney disease with heart failure and stage 1 through stage 4 chronic kidney disease, or unspecified chronic kidney disease: Secondary | ICD-10-CM | POA: Diagnosis not present

## 2020-06-08 DIAGNOSIS — Z7901 Long term (current) use of anticoagulants: Secondary | ICD-10-CM | POA: Insufficient documentation

## 2020-06-08 DIAGNOSIS — Z794 Long term (current) use of insulin: Secondary | ICD-10-CM | POA: Insufficient documentation

## 2020-06-08 DIAGNOSIS — I5022 Chronic systolic (congestive) heart failure: Secondary | ICD-10-CM | POA: Diagnosis not present

## 2020-06-08 DIAGNOSIS — I4891 Unspecified atrial fibrillation: Secondary | ICD-10-CM | POA: Insufficient documentation

## 2020-06-08 DIAGNOSIS — Z79899 Other long term (current) drug therapy: Secondary | ICD-10-CM | POA: Diagnosis not present

## 2020-06-08 DIAGNOSIS — J449 Chronic obstructive pulmonary disease, unspecified: Secondary | ICD-10-CM | POA: Diagnosis not present

## 2020-06-08 DIAGNOSIS — N184 Chronic kidney disease, stage 4 (severe): Secondary | ICD-10-CM | POA: Diagnosis not present

## 2020-06-08 DIAGNOSIS — I517 Cardiomegaly: Secondary | ICD-10-CM | POA: Diagnosis not present

## 2020-06-08 DIAGNOSIS — J45909 Unspecified asthma, uncomplicated: Secondary | ICD-10-CM | POA: Insufficient documentation

## 2020-06-08 DIAGNOSIS — Z7951 Long term (current) use of inhaled steroids: Secondary | ICD-10-CM | POA: Insufficient documentation

## 2020-06-08 DIAGNOSIS — E1122 Type 2 diabetes mellitus with diabetic chronic kidney disease: Secondary | ICD-10-CM | POA: Insufficient documentation

## 2020-06-08 DIAGNOSIS — R0602 Shortness of breath: Secondary | ICD-10-CM | POA: Diagnosis not present

## 2020-06-08 DIAGNOSIS — R079 Chest pain, unspecified: Secondary | ICD-10-CM | POA: Diagnosis not present

## 2020-06-08 LAB — CBC
HCT: 41.2 % (ref 36.0–46.0)
Hemoglobin: 13.3 g/dL (ref 12.0–15.0)
MCH: 31 pg (ref 26.0–34.0)
MCHC: 32.3 g/dL (ref 30.0–36.0)
MCV: 96 fL (ref 80.0–100.0)
Platelets: 226 10*3/uL (ref 150–400)
RBC: 4.29 MIL/uL (ref 3.87–5.11)
RDW: 13.4 % (ref 11.5–15.5)
WBC: 10.7 10*3/uL — ABNORMAL HIGH (ref 4.0–10.5)
nRBC: 0 % (ref 0.0–0.2)

## 2020-06-08 LAB — TROPONIN I (HIGH SENSITIVITY)
Troponin I (High Sensitivity): 40 ng/L — ABNORMAL HIGH (ref <18)
Troponin I (High Sensitivity): 55 ng/L — ABNORMAL HIGH (ref <18)

## 2020-06-08 LAB — MAGNESIUM: Magnesium: 2 mg/dL (ref 1.7–2.4)

## 2020-06-08 LAB — BASIC METABOLIC PANEL
Anion gap: 10 (ref 5–15)
BUN: 21 mg/dL (ref 8–23)
CO2: 32 mmol/L (ref 22–32)
Calcium: 8.9 mg/dL (ref 8.9–10.3)
Chloride: 96 mmol/L — ABNORMAL LOW (ref 98–111)
Creatinine, Ser: 4.19 mg/dL — ABNORMAL HIGH (ref 0.44–1.00)
GFR, Estimated: 10 mL/min — ABNORMAL LOW (ref >60)
Glucose, Bld: 241 mg/dL — ABNORMAL HIGH (ref 70–99)
Potassium: 3.8 mmol/L (ref 3.5–5.1)
Sodium: 138 mmol/L (ref 135–145)

## 2020-06-08 LAB — BRAIN NATRIURETIC PEPTIDE: B Natriuretic Peptide: 402 pg/mL — ABNORMAL HIGH (ref 0.0–100.0)

## 2020-06-08 MED ORDER — ETOMIDATE 2 MG/ML IV SOLN
10.0000 mg | Freq: Once | INTRAVENOUS | Status: DC
  Filled 2020-06-08: qty 10

## 2020-06-08 MED ORDER — APIXABAN 5 MG PO TABS
5.0000 mg | ORAL_TABLET | Freq: Once | ORAL | Status: AC
  Administered 2020-06-08: 5 mg via ORAL
  Filled 2020-06-08: qty 1

## 2020-06-08 NOTE — ED Notes (Signed)
Notified PA that pt is at controlled HR at this time. Waiting for recommendations from [place] MD

## 2020-06-08 NOTE — ED Provider Notes (Signed)
[facility] EMERGENCY DEPARTMENT Provider Note   CSN: [id number] Arrival date & time: 06/08/20  0938     History Chief Complaint  Patient presents with  . Chest Pain  . Shortness of Breath    Stephanie Woodward is a 80 y.o. 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  History is obtained by EMS reports that her heart rate was initially in the 230s and so 500 cc NS and two rounds of 10 mg IV Cardizem.  On my examination, patient reports that she is feeling improved after fluids and Cardizem administered per EMS.  She states that she felt peripherally fine yesterday and first thing this morning after she woke up, but around 9 AM developed shortness of breath and central chest discomfort.  She presumed this was her typical A. fib with RVR.  She reports that she was here in the ED 10/2019 and required hospital cardioversion.  She is followed by Dr. Aundra Dubin, cardiology.  She is still in atrial fibrillation with RVR with rates between 110-140.  Blood pressure is stable.    HPI     Past Medical History:  Diagnosis Date  . Arthritis   . Asthma   . Atrial fibrillation ([place])   . CHF (congestive heart failure) ([place])   . CKD (chronic kidney disease), stage III ([place])   . COPD (chronic obstructive pulmonary disease) ([place])   . Diabetes mellitus    INSULIN DEPENDENT  . Gout   . Heart murmur    no issues per pt  . History of kidney stones   . Hyperlipemia   . Hypertension   . Psoriasis   . Renal disorder    congenital  . Single kidney   . Sleep apnea    doesn't use the Cpap    Patient Active Problem List   Diagnosis Date Noted  . Nausea without vomiting 05/16/2020  . Paroxysmal atrial fibrillation ([place]) 10/31/2019  . Secondary hypercoagulable state ([place]) 10/31/2019  . Type 2 diabetes  mellitus with hyperglycemia, with long-term current use of insulin ([place]) 02/02/2019  . 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 Type 2 diabetes mellitus with stage 4 chronic kidney disease, with long-term current use of insulin ([place]) 02/02/2019  . Acute renal failure superimposed on chronic kidney disease ([place]) 01/24/2019  . Leukocytosis 01/24/2019  . Hypokalemia 01/24/2019  . Atrial fibrillation, chronic ([place]) 01/24/2019  . Chronic anticoagulation 01/24/2019  . Chronic kidney disease (CKD), stage IV (severe) ([place]) 10/31/2018  . Atrial fibrillation with RVR ([place]) 04/08/2016  . Chest pain 04/04/2016  . Acute on chronic renal failure ([place]) 03/09/2016  . Diabetes mellitus with complication ([place]) 18/29/9371  . Foot pain, bilateral 03/09/2016  . Renal disorder   . Bradycardia   . CHF (congestive heart failure) ([place]) 02/06/2014  . Cellulitis of right lower extremity 02/06/2014  . Essential hypertension 02/06/2014  . Hyperlipidemia 02/06/2014  . Uncontrolled type 2 diabetes mellitus with hypoglycemia ([place]) 02/06/2014  . Cellulitis 02/06/2014  . Hypoxemia 12/07/2013  . Type 2 diabetes mellitus, controlled, with renal complications ([place]) 69/67/8938  . Kidney disease, chronic, stage III (GFR 30-59 ml/min) ([place]) 12/07/2013  . Pulmonary hypertension ([name]) 12/07/2013  . Chronic systolic congestive heart failure ([place])   . SOB (shortness of breath) 12/05/2013  . Gout 11/11/2009  .  Obesity 11/11/2009  . Hypertensive heart disease     Past Surgical History:  Procedure Laterality Date  . ABDOMINAL HYSTERECTOMY    . AV FISTULA PLACEMENT Left 11/17/2018   Procedure: BRACHIO-CEPHALIC ARTERIOVENOUS (AV) FISTULA CREATION IN LEFT ARM;  Surgeon: Marty Heck, MD;  Location: [facility];  Service: Vascular;  Laterality: Left;  . CHOLECYSTECTOMY    . INSERTION OF DIALYSIS CATHETER Right 01/26/2019   Procedure: INSERTION OF DIALYSIS CATHETER, right internal  jugular;  Surgeon: Angelia Mould, MD;  Location: [facility];  Service: Vascular;  Laterality: Right;  . LEFT AND RIGHT HEART CATHETERIZATION WITH CORONARY ANGIOGRAM N/A 11/29/2013   Procedure: LEFT AND RIGHT HEART CATHETERIZATION WITH CORONARY ANGIOGRAM;  Surgeon: Jacolyn Reedy, MD;  Location: [facility] CATH LAB;  Service: Cardiovascular;  Laterality: N/A;  . RIGHT HEART CATH N/A 11/23/2017   Procedure: RIGHT HEART CATH;  Surgeon: Larey Dresser, MD;  Location: [facility] CV LAB;  Service: Cardiovascular;  Laterality: N/A;  . RIGHT/LEFT HEART CATH AND CORONARY ANGIOGRAPHY N/A 06/15/2019   Procedure: RIGHT/LEFT HEART CATH AND CORONARY ANGIOGRAPHY;  Surgeon: Larey Dresser, MD;  Location: [facility] CV LAB;  Service: Cardiovascular;  Laterality: N/A;     OB History   No obstetric history on file.     Family History  Problem Relation Age of Onset  . Lupus Daughter   . Cancer Mother 76       type unknown  . CVA Father 65  . Cancer Brother 53       type unknown    Social History   Tobacco Use  . Smoking status: Former Smoker    Packs/day: 1.00    Years: 20.00    Pack years: 20.00    Types: Cigarettes    Quit date: 02/22/2009    Years since quitting: 11.2  . Smokeless tobacco: Never Used  Vaping Use  . Vaping Use: Never used  Substance Use Topics  . Alcohol use: Yes    Alcohol/week: 0.0 standard drinks    Comment: occasional beer  . Drug use: Not Currently    Comment: marijuana in the past    Home Medications Prior to Admission medications   Medication Sig Start Date End Date Taking? Authorizing Provider  albuterol (PROVENTIL HFA;VENTOLIN HFA) 108 (90 BASE) MCG/ACT inhaler Inhale 2 puffs into the lungs every 4 (four) hours as needed for shortness of breath.   Yes [provider]  apixaban (ELIQUIS) 5 MG TABS tablet Take 1 tablet (5 mg total) by mouth 2 (two) times daily. 06/11/19  Yes Larey Dresser, MD  atorvastatin (LIPITOR) 80 MG tablet Take 80 mg by mouth  every evening.    Yes [provider]  B Complex-C-Zn-Folic Acid (DIALYVITE 361-WERX 15) 0.8 MG TABS Take 1 tablet by mouth every evening.  03/07/19  Yes [provider]  betamethasone dipropionate 0.05 % cream Apply topically. 10/05/19  Yes [provider]  budesonide-formoterol (SYMBICORT) 160-4.5 MCG/ACT inhaler Inhale 2 puffs into the lungs 2 (two) times daily. 04/09/20  Yes Collene Gobble, MD  fluticasone (CUTIVATE) 0.005 % ointment Apply 1 application topically 2 (two) times daily as needed (skin irritation/rash).  10/29/17  Yes [provider]  hydrALAZINE (APRESOLINE) 100 MG tablet TAKE 1 TABLET BY MOUTH THREE TIMES DAILY Patient taking differently: Take 50 mg by mouth 3 (three) times daily. 12/24/19  Yes Simmons, Brittainy M, PA-C  insulin aspart (NOVOLOG FLEXPEN) 100 UNIT/ML FlexPen Inject 10 Units into the skin 2 (  two) times daily with breakfast and lunch AND 12 Units daily with supper. Patient taking differently: Inject 10 Units into the skin 2 (two) times daily with breakfast and lunch and 12 units with supper. 02/06/20  Yes Shamleffer, Melanie Crazier, MD  insulin degludec (TRESIBA FLEXTOUCH) 100 UNIT/ML FlexTouch Pen Inject 18 Units into the skin daily. 02/06/20  Yes Shamleffer, Melanie Crazier, MD  isosorbide mononitrate (IMDUR) 30 MG 24 hr tablet Take 1 tablet by mouth once daily 05/23/20  Yes Larey Dresser, MD  ketoconazole (NIZORAL) 2 % cream Apply topically. 10/05/19  Yes [provider]  latanoprost (XALATAN) 0.005 % ophthalmic solution Place 1 drop into both eyes at bedtime.   Yes [provider]  lidocaine (LINDAMANTLE) 3 % CREA cream Apply 1 application topically as needed (Before dialysis on Tuesdays, Thursday, and Saturday.). 03/06/20  Yes [provider]  metoprolol succinate (TOPROL-XL) 25 MG 24 hr tablet Take 2 tablets (50 mg total) by mouth every morning AND 1 tablet (25 mg total) every evening. 07/25/19  Yes Larey Dresser, MD  mirtazapine (REMERON) 15 MG tablet Take 15 mg by mouth at bedtime.   Yes [provider]  ondansetron (ZOFRAN) 4 MG tablet Take 4 mg by mouth 2 (two) times daily as needed for nausea.  10/26/18  Yes [provider]  pantoprazole (PROTONIX) 40 MG tablet Take 40 mg by mouth every evening.  10/26/18  Yes [provider]  sucroferric oxyhydroxide (VELPHORO) 500 MG chewable tablet Chew 500 mg by mouth See admin instructions. Crush or chew or swallow 1 tablet three times a day with meals and 1 tablet two times a day with snacks.   Yes [provider]  traMADol (ULTRAM) 50 MG tablet Take 50 mg by mouth every 6 (six) hours as needed for severe pain (for pain.).    Yes [provider]  triamcinolone cream (KENALOG) 0.1 % Apply 1 application topically 2 (two) times daily as needed (skin rash/irritation.).    Yes [provider]  Blood Glucose Monitoring Suppl (FREESTYLE LITE) DEVI Use as instructed to check blood sugar 4 times daily 05/22/20   Shamleffer, Melanie Crazier, MD  Continuous Blood Gluc Receiver (FREESTYLE LIBRE READER) DEVI 1 kit by Does not apply route daily. Use as instructed E11.65 09/12/19   Shamleffer, Melanie Crazier, MD  Continuous Blood Gluc Sensor (FREESTYLE LIBRE 14 DAY SENSOR) MISC 1 every 14 days E11.65 01/10/20   Shamleffer, Melanie Crazier, MD  glucose blood (FREESTYLE LITE) test strip Use as instructed to check blood sugar 4 times daily 05/22/20   Shamleffer, Melanie Crazier, MD  Insulin Pen Needle 32G X 4 MM MISC 1 Device by Does not apply route in the morning, at noon, in the evening, and at bedtime. 02/06/20   Shamleffer, Melanie Crazier, MD  NARCAN 4 MG/0.1ML LIQD nasal spray kit Place 1 spray into the nose See admin instructions. Call EMS at first sign of opioid overdose. Place on spray in nose.  If no response, then place one spray ing the opposite side of the nose. 01/02/19   [provider]    Allergies     Eggs or egg-derived products, Lisinopril, Penicillins, and Other  Review of Systems   Review of Systems  All other systems reviewed and are negative.   Physical Exam Updated Vital Signs BP (!) 117/57   Pulse 70   Temp 98.3 F (36.8 C) (Oral)   Resp 17   Ht _0  (1.575 m)  Wt 87.1 kg   SpO2 98%   BMI 35.12 kg/m   Physical Exam Vitals and nursing note reviewed. Exam conducted with a chaperone present.  Constitutional:      General: She is not in acute distress.    Appearance: Normal appearance. She is not ill-appearing or toxic-appearing.  HENT:     Head: Normocephalic and atraumatic.  Eyes:     General: No scleral icterus.    Conjunctiva/sclera: Conjunctivae normal.  Cardiovascular:     Rate and Rhythm: Tachycardia present. Rhythm irregular.     Pulses: Normal pulses.  Pulmonary:     Effort: Pulmonary effort is normal. No respiratory distress.  Abdominal:     General: Abdomen is flat. There is no distension.     Palpations: Abdomen is soft.     Tenderness: There is no abdominal tenderness.  Skin:    General: Skin is dry.  Neurological:     Mental Status: She is alert and oriented to person, place, and time.     GCS: GCS eye subscore is 4. GCS verbal subscore is 5. GCS motor subscore is 6.  Psychiatric:        Mood and Affect: Mood normal.        Behavior: Behavior normal.        Thought Content: Thought content normal.     ED Results / Procedures / Treatments   Labs (all labs ordered are listed, but only abnormal results are displayed) Labs Reviewed  BASIC METABOLIC PANEL - Abnormal; Notable for the following components:      Result Value   Chloride 96 (*)    Glucose, Bld 241 (*)    Creatinine, Ser 4.19 (*)    GFR, Estimated 10 (*)    All other components within normal limits  CBC - Abnormal; Notable for the following components:   WBC 10.7 (*)    All other components within normal limits  BRAIN NATRIURETIC PEPTIDE - Abnormal; Notable for the  following components:   B Natriuretic Peptide 402.0 (*)    All other components within normal limits  TROPONIN I (HIGH SENSITIVITY) - Abnormal; Notable for the following components:   Troponin I (High Sensitivity) 40 (*)    All other components within normal limits  TROPONIN I (HIGH SENSITIVITY) - Abnormal; Notable for the following components:   Troponin I (High Sensitivity) 55 (*)    All other components within normal limits  MAGNESIUM    EKG None  Radiology DG Chest 2 View  Result Date: 06/08/2020 CLINICAL DATA:  Chest pain and shortness of breath. EXAM: CHEST - 2 VIEW COMPARISON:  Chest x-ray dated 10/24/2019 FINDINGS: Mild cardiomegaly, stable. Lungs are clear. No pleural effusion or pneumothorax is seen. Osseous structures about the chest are unremarkable. IMPRESSION: No active cardiopulmonary disease. No evidence of pneumonia or pulmonary edema. Electronically Signed   By: Franki Cabot M.D.   On: 06/08/2020 10:25    Procedures Procedures   Medications Ordered in ED Medications  etomidate (AMIDATE) injection 10 mg (10 mg Intravenous Not Given 06/08/20 1358)  apixaban (ELIQUIS) tablet 5 mg (5 mg Oral Given 06/08/20 1352)    ED Course  I have reviewed the triage vital signs and the nursing notes.  Pertinent labs & imaging results that were available during my care of the patient were reviewed by me and considered in my medical decision making (see chart for details).  Clinical Course as of 06/08/20 1449  Sun Jun 08, 2020  1308 I spoke  with Dr. Margaretann Loveless who reviewed patient's medical record and feels as though electrical cardioversion is reasonable.   [GG]    Clinical Course User Index [GG] Corena Herter, PA-C   MDM Rules/Calculators/A&P                          Edie Vallandingham was evaluated in Emergency Department on 06/08/2020 for the symptoms described in the history of present illness. She was evaluated in the context of the global COVID-19 pandemic, which  necessitated consideration that the patient might be at risk for infection with the SARS-CoV-2 virus that causes COVID-19. Institutional protocols and algorithms that pertain to the evaluation of patients at risk for COVID-19 are in a state of rapid change based on information released by regulatory bodies including the CDC and federal and state organizations. These policies and algorithms were followed during the patient's care in the ED.  I personally reviewed patient's medical chart and all notes from triage and staff during today's encounter. I have also ordered and reviewed all labs and imaging that I felt to be medically necessary in the evaluation of this patient's complaints and with consideration of their physical exam. If needed, translation services were available and utilized.   Patient is followed by Dr. Aundra Dubin, [place].  We will consult cardiology before cardioversion.  She continues to be in atrial fibrillation with RVR, however blood pressure remained stable.  She is not in any acute distress or exhibiting increased work of breathing.  Initial troponin of 40 is consistent with her baseline.  I have lower suspicion for ACS as cause for her chest pain and shortness of breath.  Patient had not yet taken her morning Eliquis dose, but has not missed any previous doses.  I spoke with Dr. Margaretann Loveless who reviewed patient's medical record and feels as though electrical cardioversion is reasonable.    Just as we are about to electrically cardiovert, patient spontaneously converts.  Repeat EKG shows P wave before QRS complex, she appears to be back in sinus rhythm.  Heart rate is well controlled in 70s.  She denies any complaints and feels prepared for discharge.  Second troponin mildly increased, but no pain.  Discussed with Dr. Almyra Free, continue with plan for discharge.  I mentioned how her BNP was mildly elevated compared to recent labs.  She states that her nephrologist just recently adjusted her  diuretic medications.  Suspect that this is the contributing issue and may have even provoked her atrial fibrillation.  She will follow-up with her cardiologist and nephrologist.  ED return precautions discussed.  Patient voices understanding and is agreeable to the plan.  Final Clinical Impression(s) / ED Diagnoses Final diagnoses:  Atrial fibrillation with RVR [facility])    Rx / DC Orders ED Discharge Orders    None       Reita Chard 06/08/20 1449    Luna Fuse, MD 06/09/20 [phone]

## 2020-06-08 NOTE — ED Triage Notes (Signed)
Pt from home BIB EMS for c/o sudden CP. Pt HR 230s. EMS administered 10mg  Cardizem x2 54mL NS and 324 ASA. Hx of afib; takes metoprolol and elequis. Pt denies CP on arrival however is feeling SOB.  Dialysis pt (T/TR/Sat). No missed tx.

## 2020-06-08 NOTE — ED Notes (Signed)
Main lab to add on BNP and Magnesium. ED-Lab.

## 2020-06-08 NOTE — ED Notes (Signed)
Pt NSR, per MD no cardioversion. EKG obtained and given to MD

## 2020-06-08 NOTE — ED Notes (Signed)
Patient transported to X-ray 

## 2020-06-08 NOTE — Discharge Instructions (Addendum)
You were in atrial fibrillation with rapid ventricular rate, but then spontaneously converted.  He did not require cardioversion here in the ER.  Your brain natruretic peptide, a measure of fluid overload, was slightly elevated when compared to prior labs.  You mentioned that your diuretic medication was recently adjusted.  This may have contributed to you going into atrial fibrillation.  Please follow-up with your nephrologist as well as your cardiologist regarding today's ED encounter and for ongoing evaluation and management.  I am glad that you are feeling improved.  Continue with your home medications, as prescribed.  Return to the ER or seek immediate medical attention should you experience any new or worsening symptoms.

## 2020-06-10 DIAGNOSIS — N2581 Secondary hyperparathyroidism of renal origin: Secondary | ICD-10-CM | POA: Diagnosis not present

## 2020-06-10 DIAGNOSIS — N186 End stage renal disease: Secondary | ICD-10-CM | POA: Diagnosis not present

## 2020-06-10 DIAGNOSIS — D631 Anemia in chronic kidney disease: Secondary | ICD-10-CM | POA: Diagnosis not present

## 2020-06-10 DIAGNOSIS — Z992 Dependence on renal dialysis: Secondary | ICD-10-CM | POA: Diagnosis not present

## 2020-06-11 ENCOUNTER — Ambulatory Visit
Admission: RE | Admit: 2020-06-11 | Discharge: 2020-06-11 | Disposition: A | Discharge status: Home / Self Care | Payer: HMO | Source: Ambulatory Visit | Attending: Nephrology | Admitting: Nephrology

## 2020-06-11 DIAGNOSIS — M79662 Pain in left lower leg: Secondary | ICD-10-CM

## 2020-06-11 DIAGNOSIS — I824Y2 Acute embolism and thrombosis of unspecified deep veins of left proximal lower extremity: Secondary | ICD-10-CM

## 2020-06-11 DIAGNOSIS — R600 Localized edema: Secondary | ICD-10-CM | POA: Diagnosis not present

## 2020-06-17 DIAGNOSIS — N186 End stage renal disease: Secondary | ICD-10-CM | POA: Diagnosis not present

## 2020-06-17 DIAGNOSIS — Z992 Dependence on renal dialysis: Secondary | ICD-10-CM | POA: Diagnosis not present

## 2020-06-17 DIAGNOSIS — N2581 Secondary hyperparathyroidism of renal origin: Secondary | ICD-10-CM | POA: Diagnosis not present

## 2020-06-21 DIAGNOSIS — Z992 Dependence on renal dialysis: Secondary | ICD-10-CM | POA: Diagnosis not present

## 2020-06-21 DIAGNOSIS — I129 Hypertensive chronic kidney disease with stage 1 through stage 4 chronic kidney disease, or unspecified chronic kidney disease: Secondary | ICD-10-CM | POA: Diagnosis not present

## 2020-06-21 DIAGNOSIS — N186 End stage renal disease: Secondary | ICD-10-CM | POA: Diagnosis not present

## 2020-06-24 DIAGNOSIS — N2581 Secondary hyperparathyroidism of renal origin: Secondary | ICD-10-CM | POA: Diagnosis not present

## 2020-06-24 DIAGNOSIS — D631 Anemia in chronic kidney disease: Secondary | ICD-10-CM | POA: Diagnosis not present

## 2020-06-24 DIAGNOSIS — R52 Pain, unspecified: Secondary | ICD-10-CM | POA: Diagnosis not present

## 2020-06-24 DIAGNOSIS — N186 End stage renal disease: Secondary | ICD-10-CM | POA: Diagnosis not present

## 2020-06-24 DIAGNOSIS — D509 Iron deficiency anemia, unspecified: Secondary | ICD-10-CM | POA: Diagnosis not present

## 2020-06-24 DIAGNOSIS — Z992 Dependence on renal dialysis: Secondary | ICD-10-CM | POA: Diagnosis not present

## 2020-07-01 DIAGNOSIS — Z992 Dependence on renal dialysis: Secondary | ICD-10-CM | POA: Diagnosis not present

## 2020-07-01 DIAGNOSIS — R52 Pain, unspecified: Secondary | ICD-10-CM | POA: Diagnosis not present

## 2020-07-01 DIAGNOSIS — D631 Anemia in chronic kidney disease: Secondary | ICD-10-CM | POA: Diagnosis not present

## 2020-07-01 DIAGNOSIS — N186 End stage renal disease: Secondary | ICD-10-CM | POA: Diagnosis not present

## 2020-07-01 DIAGNOSIS — N2581 Secondary hyperparathyroidism of renal origin: Secondary | ICD-10-CM | POA: Diagnosis not present

## 2020-07-08 DIAGNOSIS — R52 Pain, unspecified: Secondary | ICD-10-CM | POA: Diagnosis not present

## 2020-07-08 DIAGNOSIS — Z992 Dependence on renal dialysis: Secondary | ICD-10-CM | POA: Diagnosis not present

## 2020-07-08 DIAGNOSIS — N186 End stage renal disease: Secondary | ICD-10-CM | POA: Diagnosis not present

## 2020-07-08 DIAGNOSIS — D631 Anemia in chronic kidney disease: Secondary | ICD-10-CM | POA: Diagnosis not present

## 2020-07-08 DIAGNOSIS — N2581 Secondary hyperparathyroidism of renal origin: Secondary | ICD-10-CM | POA: Diagnosis not present

## 2020-07-15 DIAGNOSIS — N186 End stage renal disease: Secondary | ICD-10-CM | POA: Diagnosis not present

## 2020-07-15 DIAGNOSIS — N2581 Secondary hyperparathyroidism of renal origin: Secondary | ICD-10-CM | POA: Diagnosis not present

## 2020-07-15 DIAGNOSIS — E1122 Type 2 diabetes mellitus with diabetic chronic kidney disease: Secondary | ICD-10-CM | POA: Diagnosis not present

## 2020-07-15 DIAGNOSIS — D631 Anemia in chronic kidney disease: Secondary | ICD-10-CM | POA: Diagnosis not present

## 2020-07-15 DIAGNOSIS — R52 Pain, unspecified: Secondary | ICD-10-CM | POA: Diagnosis not present

## 2020-07-15 DIAGNOSIS — Z992 Dependence on renal dialysis: Secondary | ICD-10-CM | POA: Diagnosis not present

## 2020-07-16 ENCOUNTER — Ambulatory Visit (HOSPITAL_COMMUNITY)
Admission: RE | Admit: 2020-07-16 | Discharge: 2020-07-16 | Disposition: A | Discharge status: Home / Self Care | Payer: HMO | Source: Ambulatory Visit | Attending: Cardiology | Admitting: Cardiology

## 2020-07-16 ENCOUNTER — Other Ambulatory Visit: Payer: Self-pay

## 2020-07-16 ENCOUNTER — Encounter (HOSPITAL_COMMUNITY): Payer: Self-pay

## 2020-07-16 VITALS — BP 134/48 | HR 86 | Wt 193.6 lb

## 2020-07-16 DIAGNOSIS — E785 Hyperlipidemia, unspecified: Secondary | ICD-10-CM | POA: Diagnosis not present

## 2020-07-16 DIAGNOSIS — Z7951 Long term (current) use of inhaled steroids: Secondary | ICD-10-CM | POA: Insufficient documentation

## 2020-07-16 DIAGNOSIS — Z79899 Other long term (current) drug therapy: Secondary | ICD-10-CM | POA: Insufficient documentation

## 2020-07-16 DIAGNOSIS — I272 Pulmonary hypertension, unspecified: Secondary | ICD-10-CM | POA: Diagnosis not present

## 2020-07-16 DIAGNOSIS — J449 Chronic obstructive pulmonary disease, unspecified: Secondary | ICD-10-CM | POA: Diagnosis not present

## 2020-07-16 DIAGNOSIS — I132 Hypertensive heart and chronic kidney disease with heart failure and with stage 5 chronic kidney disease, or end stage renal disease: Secondary | ICD-10-CM | POA: Insufficient documentation

## 2020-07-16 DIAGNOSIS — Z7901 Long term (current) use of anticoagulants: Secondary | ICD-10-CM | POA: Diagnosis not present

## 2020-07-16 DIAGNOSIS — Z87891 Personal history of nicotine dependence: Secondary | ICD-10-CM | POA: Insufficient documentation

## 2020-07-16 DIAGNOSIS — Z794 Long term (current) use of insulin: Secondary | ICD-10-CM | POA: Insufficient documentation

## 2020-07-16 DIAGNOSIS — I5032 Chronic diastolic (congestive) heart failure: Secondary | ICD-10-CM | POA: Diagnosis not present

## 2020-07-16 DIAGNOSIS — E1122 Type 2 diabetes mellitus with diabetic chronic kidney disease: Secondary | ICD-10-CM | POA: Diagnosis not present

## 2020-07-16 DIAGNOSIS — E669 Obesity, unspecified: Secondary | ICD-10-CM | POA: Diagnosis not present

## 2020-07-16 DIAGNOSIS — N186 End stage renal disease: Secondary | ICD-10-CM | POA: Diagnosis not present

## 2020-07-16 DIAGNOSIS — I480 Paroxysmal atrial fibrillation: Secondary | ICD-10-CM | POA: Diagnosis not present

## 2020-07-16 DIAGNOSIS — I2510 Atherosclerotic heart disease of native coronary artery without angina pectoris: Secondary | ICD-10-CM | POA: Insufficient documentation

## 2020-07-16 LAB — LIPID PANEL
Cholesterol: 183 mg/dL (ref 0–200)
HDL: 56 mg/dL (ref >40)
LDL Cholesterol: 96 mg/dL (ref 0–99)
Total CHOL/HDL Ratio: 3.3 RATIO
Triglycerides: 157 mg/dL — ABNORMAL HIGH (ref <150)
VLDL: 31 mg/dL (ref 0–40)

## 2020-07-16 LAB — CBC
HCT: 37.4 % (ref 36.0–46.0)
Hemoglobin: 11.8 g/dL — ABNORMAL LOW (ref 12.0–15.0)
MCH: 30.3 pg (ref 26.0–34.0)
MCHC: 31.6 g/dL (ref 30.0–36.0)
MCV: 95.9 fL (ref 80.0–100.0)
Platelets: 235 10*3/uL (ref 150–400)
RBC: 3.9 MIL/uL (ref 3.87–5.11)
RDW: 13.2 % (ref 11.5–15.5)
WBC: 8.3 10*3/uL (ref 4.0–10.5)
nRBC: 0 % (ref 0.0–0.2)

## 2020-07-16 LAB — HEPATIC FUNCTION PANEL
ALT: 14 U/L (ref 0–44)
AST: 14 U/L — ABNORMAL LOW (ref 15–41)
Albumin: 2.8 g/dL — ABNORMAL LOW (ref 3.5–5.0)
Alkaline Phosphatase: 118 U/L (ref 38–126)
Bilirubin, Direct: <0.1 mg/dL (ref 0.0–0.2)
Total Bilirubin: 0.5 mg/dL (ref 0.3–1.2)
Total Protein: 6.9 g/dL (ref 6.5–8.1)

## 2020-07-16 NOTE — Patient Instructions (Signed)
Labs today We will only contact you if something comes back abnormal or we need to make some changes. Otherwise no news is good news!  Your physician recommends that you schedule a follow-up appointment in: 6 months with Dr Aundra Dubin.  Our office will call you to schedule this appointment. Please call in September if  You do not get this call  Please call office at [phone] option [phone] if you have any questions or concerns.   At the [facility], you and your health needs are our priority. As part of our continuing mission to provide you with exceptional heart care, we have created designated Provider Care Teams. These Care Teams include your primary Cardiologist (physician) and Advanced Practice Providers (APPs- Physician Assistants and Nurse Practitioners) who all work together to provide you with the care you need, when you need it.   You may see any of the following providers on your designated Care Team at your next follow up: Marland Kitchen Dr Glori Bickers . Dr Loralie Champagne . Dr Vickki Muff . Darrick Grinder, NP . Lyda Jester, [place] . Audry Riles, PharmD   Please be sure to bring in all your medications bottles to every appointment.

## 2020-07-16 NOTE — Progress Notes (Signed)
Advanced Heart Failure Clinic Note  PCP: Alroy Dust, L.Marlou Sa, MD HF Cardiology: Dr. Aundra Dubin  80 y.o.with history of ESRD, paroxysmal atrial fibrillation, and chronic diastolic CHF was referred by Dr. Wynonia Lawman for evaluation of CHF.  RHC/LHC was done in 10/15.  This showed markedly elevated filling pressures and pulmonary venous hypertension.  There was no significant CAD.  Last echo in 2/18 showed EF 50-55%, mild LVH, grade 2 diastolic dysfunction. She is followed by Dr. Hollie Salk  for nephrology.   Admitted 10/2-10/6/19 with SOB. RHC showed elevated filling pressures and preserved CO. She was diuresed with IV lasix, then transitioned to torsemide 80 mg am, 40 mg pm. Repeat echo showed EF 45-50% with normal RV. PYP scan was negative for TTR amyloid. PFTs showed restrictive disease. CT chest showed no ILD but did show right hydronephrosis. Renal US showed single kidney with mild to moderate hydronephrosis on right (unchanged since 2011).  DC weight 208 lbs.    In 12/20, she progressed to ESRD and was started on HD.    Echo in 1/21 showed EF 55-60% with normal.  RHC/LHC in 4/21 showed no obstructive CAD, normal filling pressures, mild pulmonary hypertension.  She had atrial fibrillation in 9/21 and underwent DCCV to NSR.   4/22 was seen in ED for acute dyspnea. Found to be in rapid Afib. This was in setting of recent consumption of high caffeine energy drink. She was given IV Cardizem and converted back to NSR and was released same day.  She returns for followup of diastolic CHF and atrial fibrillation. Here w/ her daughter.  She is in NSR today on EKG. Denies any breakthrough symptoms. Reports full compliance w/ Eliquis. No abnormal bleeding. Has been avoiding energy drinks.  BP well controlled today.  BP runs low at times with HD. Volume status ok on exam. Continues w/ chronic stable dyspnea. No resting dyspnea, denies orthopnea/ PND. No LEE.   ECG (personally reviewed):  NSR w/ PACS  Labs (2/18): K 4,  creatinine 1.79 Labs (10/19): K 4.5, creatinine 2.4 Labs (12/19): LDL 62 Labs (1/20): K 3.9, creatinine 2.72 Labs (12/20): hgb 11.9 Labs (9/21): LDL 64, HDL 53 Labs (4/21): SCr 4.19, K 3.8   PMH: 1. Atrial fibrillation: Paroxysmal.  2. HTN 3. 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  5. ESRD 6. H/o IVCD 7. COPD: PFTs (10/19) actually showed severe restriction, concern for interstitial process.  CT chest (10/19) showed mild-moderate patchy air trapping but no evidence for interstitial lung disease.  8. Gout 9. Chronic primarily diastolic CHF: Echo (0/81) with EF 50-55%, mild LVH, grade 2 diastolic dysfunction.  - LHC/RHC (10/15): small distal LAD but no discrete stenosis.  Mean RA 15, PA 55/20 mean 34, mean PCWP 31 => pulmonary venous hypertension.  - PYP scan (10/19): No evidence for TTR amyloidosis.  - Echo (10/19): EF 45-50%, mild LVH, normal RV size and systolic function.  - RHC (10/19): mean RA 13, PA 74/27 mean 50, mean PCWP 28, CI 3.12, PVR 3.6 WU. Pulmonary venous hypertension.  - Echo (1/21): EF 55-60%, normal RV.  - LHC/RHC (4/21): super-dominant LCx with small LAD and RCA, no CAD; mean RA 5, PA 47/15, mean PCWP 10, CI 3.61, PVR 2.5 WU.  10. Negative sleep study in 6/21.   Review of systems complete and found to be negative unless listed in HPI.    Social History   Socioeconomic History  . Marital status: Widowed    Spouse name: Not on file  . Number  of children: 2  . Years of education: Not on file  . Highest education level: Not on file  Occupational History  . Occupation: retired  Tobacco Use  . Smoking status: Former Smoker    Packs/day: 1.00    Years: 20.00    Pack years: 20.00    Types: Cigarettes    Quit date: 02/22/2009    Years since quitting: 11.4  . Smokeless tobacco: Never Used  Vaping Use  . Vaping Use: Never used  Substance and Sexual Activity  . Alcohol use: Yes    Alcohol/week: 0.0 standard drinks    Comment: occasional beer   . Drug use: Not Currently    Comment: marijuana in the past  . Sexual activity: Never  Other Topics Concern  . Not on file  Social History Narrative  . Not on file   Social Determinants of Health   Financial Resource Strain: Not on file  Food Insecurity: No Food Insecurity  . Worried About Charity fundraiser in the Last Year: Never true  . Ran Out of Food in the Last Year: Never true  Transportation Needs: No Transportation Needs  . Lack of Transportation (Medical): No  . Lack of Transportation (Non-Medical): No  Physical Activity: Not on file  Stress: Not on file  Social Connections: Not on file  Intimate Partner Violence: Not on file   Family History  Problem Relation Age of Onset  . Lupus Daughter   . Cancer Mother 63       type unknown  . CVA Father 75  . Cancer Brother 45       type unknown   Review of systems complete and found to be negative unless listed in HPI.    Current Outpatient Medications  Medication Sig Dispense Refill  . albuterol (PROVENTIL HFA;VENTOLIN HFA) 108 (90 BASE) MCG/ACT inhaler Inhale 2 puffs into the lungs every 4 (four) hours as needed for shortness of breath.    Marland Kitchen apixaban (ELIQUIS) 5 MG TABS tablet Take 1 tablet (5 mg total) by mouth 2 (two) times daily. 180 tablet 3  . atorvastatin (LIPITOR) 80 MG tablet Take 80 mg by mouth every evening.     . B Complex-C-Zn-Folic Acid (DIALYVITE 562-ZHYQ 15) 0.8 MG TABS Take 1 tablet by mouth every evening.     . betamethasone dipropionate 0.05 % cream Apply topically.    . Blood Glucose Monitoring Suppl (FREESTYLE LITE) DEVI Use as instructed to check blood sugar 4 times daily 1 kit 0  . budesonide-formoterol (SYMBICORT) 160-4.5 MCG/ACT inhaler Inhale 2 puffs into the lungs 2 (two) times daily. 1 each 0  . Continuous Blood Gluc Receiver (FREESTYLE LIBRE READER) DEVI 1 kit by Does not apply route daily. Use as instructed E11.65 1 each 0  . Continuous Blood Gluc Sensor (FREESTYLE LIBRE 14 DAY SENSOR)  MISC 1 every 14 days E11.65 6 each 1  . fluticasone (CUTIVATE) 0.005 % ointment Apply 1 application topically 2 (two) times daily as needed (skin irritation/rash).   2  . glucose blood (FREESTYLE LITE) test strip Use as instructed to check blood sugar 4 times daily 100 each 5  . hydrALAZINE (APRESOLINE) 50 MG tablet Take 50 mg by mouth 3 (three) times daily.    . insulin aspart (NOVOLOG FLEXPEN) 100 UNIT/ML FlexPen Inject 10 Units into the skin 2 (two) times daily with breakfast and lunch AND 12 Units daily with supper. (Patient taking differently: Inject 10 Units into the skin 2 (two) times daily with  breakfast and lunch and 12 units with supper.) 15 mL 11  . insulin degludec (TRESIBA FLEXTOUCH) 100 UNIT/ML FlexTouch Pen Inject 18 Units into the skin daily. 15 mL 6  . Insulin Pen Needle 32G X 4 MM MISC 1 Device by Does not apply route in the morning, at noon, in the evening, and at bedtime. 400 each 3  . isosorbide mononitrate (IMDUR) 30 MG 24 hr tablet Take 1 tablet by mouth once daily 90 tablet 0  . ketoconazole (NIZORAL) 2 % cream Apply topically.    . latanoprost (XALATAN) 0.005 % ophthalmic solution Place 1 drop into both eyes at bedtime.    . lidocaine (LINDAMANTLE) 3 % CREA cream Apply 1 application topically as needed (Before dialysis on Tuesdays, Thursday, and Saturday.).    Marland Kitchen metoprolol succinate (TOPROL-XL) 25 MG 24 hr tablet Take 2 tablets (50 mg total) by mouth every morning AND 1 tablet (25 mg total) every evening. 270 tablet 3  . mirtazapine (REMERON) 15 MG tablet Take 15 mg by mouth at bedtime.    Marland Kitchen NARCAN 4 MG/0.1ML LIQD nasal spray kit Place 1 spray into the nose See admin instructions. Call EMS at first sign of opioid overdose. Place on spray in nose.  If no response, then place one spray ing the opposite side of the nose.    . ondansetron (ZOFRAN) 4 MG tablet Take 4 mg by mouth 2 (two) times daily as needed for nausea.     . pantoprazole (PROTONIX) 40 MG tablet Take 40 mg by mouth  every evening.     . sucroferric oxyhydroxide (VELPHORO) 500 MG chewable tablet Chew 500 mg by mouth See admin instructions. Crush or chew or swallow 1 tablet three times a day with meals and 1 tablet two times a day with snacks.    . traMADol (ULTRAM) 50 MG tablet Take 50 mg by mouth every 6 (six) hours as needed for severe pain (for pain.).     Marland Kitchen triamcinolone cream (KENALOG) 0.1 % Apply 1 application topically 2 (two) times daily as needed (skin rash/irritation.).      No current facility-administered medications for this encounter.   Today's Vitals   07/16/20 1044  BP: (!) 134/48  Pulse: 86  SpO2: 95%  Weight: 87.8 kg (193 lb 9.6 oz)   Body mass index is 35.41 kg/m. Wt Readings from Last 3 Encounters:  07/16/20 87.8 kg (193 lb 9.6 oz)  06/08/20 87.1 kg (192 lb)  05/16/20 87.1 kg (192 lb)   PHYSICAL EXAM: General:  Well appearing, moderately obese, ambulating w/ walker. No respiratory difficulty HEENT: normal Neck: supple. no JVD. Carotids 2+ bilat; no bruits. No lymphadenopathy or thyromegaly appreciated. Cor: PMI nondisplaced. Regular rate & rhythm. No rubs, gallops or murmurs. Lungs: clear Abdomen: obese, soft, nontender, nondistended. No hepatosplenomegaly. No bruits or masses. Good bowel sounds. Extremities: no cyanosis, clubbing, rash, edema Neuro: alert & oriented x 3, cranial nerves grossly intact. moves all 4 extremities w/o difficulty. Affect pleasant.    Assessment/Plan: 1. Chronic primarily diastolic CHF: Echo in 1/47 with EF 50-55%, mild LVH, grade 2 diastolic dysfunction. LHC in 2015 showed nonobstructive CAD and elevated filling pressures with pulmonary venous hypertension.[place] 11/23/17 showed significantly elevated right and left heart filling pressures and severe primarily pulmonary venous hypertension. Cardiac output preserved. Echo (10/19) with EF 45-50%, septal-lateral dyssynchrony, RV looked ok.  PYP scan was not suggestive of transthyretin amyloidosis.  Echo  in 1/21 with EF 55-60%, normal RV.  RHC/LHC in 4/21 with  no significant CAD, normal filling pressures and cardiac output.  NYHA Class III, chronic, and confounded by COPD and obesity. Volume status stable on exam and controlled through HD.  2. COPD: She no longer smokes. Restrictive PFTs. High rest CT negative for ILD. Suspect restrictive PFTs are related to body habitus and contributing to her chronic exertional dyspnea  3. HTN: BP controlled on current regimen and often gets low with HD.  No further titration made today  4. ESRD: HD per renal. Followed by Dr. Hollie Salk   5. Atrial fibrillation: Paroxysmal.  She is in NSR today.  - Continue apixaban 5 mg bid. Denies abnormal bleeding. Check CBC today - Advised to avoid triggers (excessive caffeine)   6. Pulmonary HTN: Mild PH on 4/21 RHC.  7. Hyperlipidemia: on atorvastatin 80. Good lipids in 9/21.  - repeat Lipid panel and HFTs today   F/u in 6 months w/ Dr. Esmeralda Links, PA-C  07/16/2020

## 2020-07-22 ENCOUNTER — Other Ambulatory Visit (HOSPITAL_COMMUNITY): Payer: Self-pay | Admitting: Cardiology

## 2020-07-22 DIAGNOSIS — Z992 Dependence on renal dialysis: Secondary | ICD-10-CM | POA: Diagnosis not present

## 2020-07-22 DIAGNOSIS — I129 Hypertensive chronic kidney disease with stage 1 through stage 4 chronic kidney disease, or unspecified chronic kidney disease: Secondary | ICD-10-CM | POA: Diagnosis not present

## 2020-07-22 DIAGNOSIS — N186 End stage renal disease: Secondary | ICD-10-CM | POA: Diagnosis not present

## 2020-07-22 DIAGNOSIS — N2581 Secondary hyperparathyroidism of renal origin: Secondary | ICD-10-CM | POA: Diagnosis not present

## 2020-07-24 DIAGNOSIS — N2581 Secondary hyperparathyroidism of renal origin: Secondary | ICD-10-CM | POA: Diagnosis not present

## 2020-07-24 DIAGNOSIS — N186 End stage renal disease: Secondary | ICD-10-CM | POA: Diagnosis not present

## 2020-07-24 DIAGNOSIS — Z992 Dependence on renal dialysis: Secondary | ICD-10-CM | POA: Diagnosis not present

## 2020-07-29 DIAGNOSIS — D631 Anemia in chronic kidney disease: Secondary | ICD-10-CM | POA: Diagnosis not present

## 2020-07-29 DIAGNOSIS — Z992 Dependence on renal dialysis: Secondary | ICD-10-CM | POA: Diagnosis not present

## 2020-07-29 DIAGNOSIS — N2581 Secondary hyperparathyroidism of renal origin: Secondary | ICD-10-CM | POA: Diagnosis not present

## 2020-07-29 DIAGNOSIS — N186 End stage renal disease: Secondary | ICD-10-CM | POA: Diagnosis not present

## 2020-08-05 DIAGNOSIS — D631 Anemia in chronic kidney disease: Secondary | ICD-10-CM | POA: Diagnosis not present

## 2020-08-05 DIAGNOSIS — N186 End stage renal disease: Secondary | ICD-10-CM | POA: Diagnosis not present

## 2020-08-05 DIAGNOSIS — Z992 Dependence on renal dialysis: Secondary | ICD-10-CM | POA: Diagnosis not present

## 2020-08-05 DIAGNOSIS — N2581 Secondary hyperparathyroidism of renal origin: Secondary | ICD-10-CM | POA: Diagnosis not present

## 2020-08-08 ENCOUNTER — Ambulatory Visit (INDEPENDENT_AMBULATORY_CARE_PROVIDER_SITE_OTHER): Payer: HMO | Admitting: Internal Medicine

## 2020-08-08 ENCOUNTER — Encounter: Payer: Self-pay | Admitting: Internal Medicine

## 2020-08-08 ENCOUNTER — Other Ambulatory Visit: Payer: Self-pay

## 2020-08-08 VITALS — BP 158/62 | HR 94 | Ht 62.0 in | Wt 194.0 lb

## 2020-08-08 DIAGNOSIS — Z794 Long term (current) use of insulin: Secondary | ICD-10-CM

## 2020-08-08 DIAGNOSIS — E1122 Type 2 diabetes mellitus with diabetic chronic kidney disease: Secondary | ICD-10-CM | POA: Diagnosis not present

## 2020-08-08 DIAGNOSIS — E1165 Type 2 diabetes mellitus with hyperglycemia: Secondary | ICD-10-CM

## 2020-08-08 DIAGNOSIS — N184 Chronic kidney disease, stage 4 (severe): Secondary | ICD-10-CM

## 2020-08-08 LAB — POCT GLYCOSYLATED HEMOGLOBIN (HGB A1C): Hemoglobin A1C: 8.3 % — AB (ref 4.0–5.6)

## 2020-08-08 MED ORDER — DEXCOM G6 SENSOR MISC: 1.0000 | 3 refills | Status: DC

## 2020-08-08 MED ORDER — DEXCOM G6 TRANSMITTER MISC: 1.0000 | 3 refills | Status: DC

## 2020-08-08 NOTE — Patient Instructions (Signed)
-   Continue Tresiba 18 units daily  - Novolog 10 units with Breakfast and lunch and 12 units with Supper  - Novolog correctional insulin: ADD extra units on insulin to your meal-time Novolog dose if your blood sugars are higher than 170. Use the scale below to help guide you:  Blood sugar before meal Number of units to inject  Less than 170 0 unit  171 -  205 1 units  206 -  240 2 units  241-  275 3 units  276 - 310 4 units  311- 345 5 units  346- 380 6 units  381- 415 7 units  416- 450 8 units      HOW TO TREAT LOW BLOOD SUGARS (Blood sugar LESS THAN 70 MG/DL) Please follow the RULE OF 15 for the treatment of hypoglycemia treatment (when your (blood sugars are less than 70 mg/dL)   STEP 1: Take 15 grams of carbohydrates when your blood sugar is low, which includes:  3-4 GLUCOSE TABS  OR 3-4 OZ OF JUICE OR REGULAR SODA OR ONE TUBE OF GLUCOSE GEL    STEP 2: RECHECK blood sugar in 15 MINUTES STEP 3: If your blood sugar is still low at the 15 minute recheck --> then, go back to STEP 1 and treat AGAIN with another 15 grams of carbohydrates.

## 2020-08-08 NOTE — Progress Notes (Signed)
Name: Stephanie Woodward  Age/ Sex: 80 y.o., female   MRN/ DOB: [id number], Jan 27, 1941     PCP: Alroy Dust, L.Marlou Sa, MD   Reason for Endocrinology Evaluation: Type 2 Diabetes Mellitus  Initial Endocrine Consultative Visit: 02/02/2019    PATIENT IDENTIFIER: Stephanie Woodward is a 80 y.o. female with a past medical history ofT2DM, HTN, ESRD on HD and CHF . The patient has followed with Endocrinology clinic since 02/02/2019 for consultative assistance with management of her diabetes.  DIABETIC HISTORY:  Stephanie Woodward was diagnosed with T2DM in 2005. She has been on oral glycemic agents in the past, recalls metformin but does not recall the rest. Her hemoglobin A1c has ranged from 8.8% in 2018, peaking at 8.9% in 2020.  On her initial visit to our clinic she had an A1c of 8.9%, she was on tresiba and Novolog. We continued the regimen but adjusted her doses.    SUBJECTIVE:   During the last visit (02/06/2020): A1c 8.2 %. We adjusted MDI regimen   Today (08/08/2020): Stephanie Woodward is here for a  follow up on diabetes. . She checks her blood sugars  daily. Stopped using [employer] .   The patient has  had hypoglycemic episodes since the last clinic visit.    Dialysis  dialysis Tuesday, Thursday and Saturday   Denies nausea or diarrhea   HOME DIABETES REGIMEN:  Tresiba 18 units  Novolog 10  units breakfast and lunch and 12 units with supper  CF : Novolog ( BG-135/35)      Statin: Yes ACE-I/ARB: No  GLUCOSE METER:  unable to download  68 - 400 mg/dL    DIABETIC COMPLICATIONS: Microvascular complications:  ESRD on HD  Denies: neuropathy , retinopathy  Last eye exam: Completed 2019   Macrovascular complications:  CHF Denies: CAD, PVD, CVA  HISTORY:  Past Medical History:  Past Medical History:  Diagnosis Date   Arthritis    Asthma    Atrial fibrillation (HCC)    CHF (congestive heart failure) ([place])    CKD (chronic kidney disease), stage III ([place])    COPD (chronic  obstructive pulmonary disease) ([place])    Diabetes mellitus    INSULIN DEPENDENT   Gout    Heart murmur    no issues per pt   History of kidney stones    Hyperlipemia    Hypertension    Psoriasis    Renal disorder    congenital   Single kidney    Sleep apnea    doesn't use the Cpap   Past Surgical History:  Past Surgical History:  Procedure Laterality Date   ABDOMINAL HYSTERECTOMY     AV FISTULA PLACEMENT Left 11/17/2018   Procedure: BRACHIO-CEPHALIC ARTERIOVENOUS (AV) FISTULA CREATION IN LEFT ARM;  Surgeon: Marty Heck, MD;  Location: [facility];  Service: Vascular;  Laterality: Left;   CHOLECYSTECTOMY     INSERTION OF DIALYSIS CATHETER Right 01/26/2019   Procedure: INSERTION OF DIALYSIS CATHETER, right internal jugular;  Surgeon: Angelia Mould, MD;  Location: [facility];  Service: Vascular;  Laterality: Right;   LEFT AND RIGHT HEART CATHETERIZATION WITH CORONARY ANGIOGRAM N/A 11/29/2013   Procedure: LEFT AND RIGHT HEART CATHETERIZATION WITH CORONARY ANGIOGRAM;  Surgeon: Jacolyn Reedy, MD;  Location: [facility] CATH LAB;  Service: Cardiovascular;  Laterality: N/A;   RIGHT HEART CATH N/A 11/23/2017   Procedure: RIGHT HEART CATH;  Surgeon: Larey Dresser, MD;  Location: [facility] CV LAB;  Service: Cardiovascular;  Laterality: N/A;   RIGHT/LEFT  HEART CATH AND CORONARY ANGIOGRAPHY N/A 06/15/2019   Procedure: RIGHT/LEFT HEART CATH AND CORONARY ANGIOGRAPHY;  Surgeon: Larey Dresser, MD;  Location: [place] [facility] CV LAB;  Service: Cardiovascular;  Laterality: N/A;   Social History:  reports that she quit smoking about 11 years ago. Her smoking use included cigarettes. She has a 20.00 pack-year smoking history. She has never used smokeless tobacco. She reports current alcohol use. She reports previous drug use. Family History:  Family History  Problem Relation Age of Onset   Lupus Daughter    Cancer Mother 52       type unknown   CVA Father 32   Cancer Brother 25       type unknown      HOME MEDICATIONS: Allergies as of 08/08/2020       Reactions   Eggs Or Egg-derived Products Nausea And Vomiting   Lisinopril Nausea And Vomiting   Penicillins Nausea And Vomiting   Has patient had a PCN reaction causing immediate rash, facial/tongue/throat swelling, SOB or lightheadedness with hypotension: No Has patient had a PCN reaction causing severe rash involving mucus membranes or skin necrosis: No Has patient had a PCN reaction that required hospitalization: No Has patient had a PCN reaction occurring within the last 10 years: No If all of the above answers are "NO", then may proceed with Cephalosporin use.   Other Other (See Comments)        Medication List        Accurate as of August 08, 2020 10:30 AM. If you have any questions, ask your nurse or doctor.          albuterol 108 (90 Base) MCG/ACT inhaler Commonly known as: VENTOLIN HFA Inhale 2 puffs into the lungs every 4 (four) hours as needed for shortness of breath.   atorvastatin 80 MG tablet Commonly known as: LIPITOR Take 80 mg by mouth every evening.   betamethasone dipropionate 0.05 % cream Apply topically.   budesonide-formoterol 160-4.5 MCG/ACT inhaler Commonly known as: SYMBICORT Inhale 2 puffs into the lungs 2 (two) times daily.   Dexcom G6 Sensor Misc 1 Device by Does not apply route as directed. What changed:  how much to take how to take this when to take this additional instructions Changed by: Dorita Sciara, MD   Dexcom G6 Transmitter Misc 1 Device by Does not apply route as directed. Started by: Dorita Sciara, MD   Dialyvite 800-Zinc 15 0.8 MG Tabs Take 1 tablet by mouth every evening.   Eliquis 5 MG Tabs tablet Generic drug: apixaban Take 1 tablet by mouth twice daily   fluticasone 0.005 % ointment Commonly known as: CUTIVATE Apply 1 application topically 2 (two) times daily as needed (skin irritation/rash).   FreeStyle [employer] 1 kit by Does  not apply route daily. Use as instructed E11.65   FreeStyle Lite Devi Use as instructed to check blood sugar 4 times daily   FREESTYLE LITE test strip Generic drug: glucose blood Use as instructed to check blood sugar 4 times daily   hydrALAZINE 50 MG tablet Commonly known as: APRESOLINE Take 50 mg by mouth 3 (three) times daily.   Insulin Pen Needle 32G X 4 MM Misc 1 Device by Does not apply route in the morning, at noon, in the evening, and at bedtime.   isosorbide mononitrate 30 MG 24 hr tablet Commonly known as: IMDUR Take 1 tablet by mouth once daily   ketoconazole 2 % cream Commonly known as: NIZORAL  Apply topically.   latanoprost 0.005 % ophthalmic solution Commonly known as: XALATAN Place 1 drop into both eyes at bedtime.   lidocaine 3 % Crea cream Commonly known as: LINDAMANTLE Apply 1 application topically as needed (Before dialysis on Tuesdays, Thursday, and Saturday.).   metoprolol succinate 25 MG 24 hr tablet Commonly known as: TOPROL-XL Take 2 tablets (50 mg total) by mouth every morning AND 1 tablet (25 mg total) every evening.   mirtazapine 15 MG tablet Commonly known as: REMERON Take 15 mg by mouth at bedtime.   Narcan 4 MG/0.1ML Liqd nasal spray kit Generic drug: naloxone Place 1 spray into the nose See admin instructions. Call EMS at first sign of opioid overdose. Place on spray in nose.  If no response, then place one spray ing the opposite side of the nose.   NovoLOG FlexPen 100 UNIT/ML FlexPen Generic drug: insulin aspart Inject 10 Units into the skin 2 (two) times daily with breakfast and lunch AND 12 Units daily with supper. What changed: See the new instructions.   ondansetron 4 MG tablet Commonly known as: ZOFRAN Take 4 mg by mouth 2 (two) times daily as needed for nausea.   pantoprazole 40 MG tablet Commonly known as: PROTONIX Take 40 mg by mouth every evening.   sucroferric oxyhydroxide 500 MG chewable tablet Commonly known as:  VELPHORO Chew 500 mg by mouth See admin instructions. Crush or chew or swallow 1 tablet three times a day with meals and 1 tablet two times a day with snacks.   traMADol 50 MG tablet Commonly known as: ULTRAM Take 50 mg by mouth every 6 (six) hours as needed for severe pain (for pain.).   Tyler Aas FlexTouch 100 UNIT/ML FlexTouch Pen Generic drug: insulin degludec Inject 18 Units into the skin daily.   triamcinolone cream 0.1 % Commonly known as: KENALOG Apply 1 application topically 2 (two) times daily as needed (skin rash/irritation.).         OBJECTIVE:   Vital Signs: BP (!) 158/62   Pulse 94   Ht '5\' 2"'  (1.575 m)   Wt 194 lb (88 kg)   SpO2 98%   BMI 35.48 kg/m   Wt Readings from Last 3 Encounters:  08/08/20 194 lb (88 kg)  07/16/20 193 lb 9.6 oz (87.8 kg)  06/08/20 192 lb (87.1 kg)     Exam: General: Pt appears well and is in NAD  Lungs: Clear with good BS bilat with no rales, rhonchi, or wheezes  Heart: RRR with normal S1 and S2 and no gallops; no murmurs; no rub  Extremities: Trace  pretibial edema.  Neuro: MS is good with appropriate affect, pt is alert and Ox3    DM foot exam:04/04/2019      The skin of the feet is intact without sores or ulcerations. Pt has a chronic rash on the right medial aspect of leg  The pedal pulses are 1+ on right and 1+ on left. The sensation is intact to a screening 5.07, 10 gram monofilament bilaterally        DATA REVIEWED:  Lab Results  Component Value Date   HGBA1C 8.3 (A) 08/08/2020   HGBA1C 8.2 (A) 02/06/2020   HGBA1C 7.5 (A) 07/02/2019   Lab Results  Component Value Date   LDLCALC 96 07/16/2020   CREATININE 4.19 (H) 06/08/2020     Lab Results  Component Value Date   CHOL 183 07/16/2020   HDL 56 07/16/2020   LDLCALC 96 07/16/2020   TRIG 157 (  H) 07/16/2020   CHOLHDL 3.3 07/16/2020          ASSESSMENT / PLAN / RECOMMENDATIONS:   1) Type 2 Diabetes Mellitus, Sub-optimally controlled, With ESRD  on  HD- Most recent A1c of 8.3 %. Goal A1c < 7.5 %.    - A1c stable  -  Pt with hx of hypoglycemia due to insulin sensitivity as well as insulin Carb mismatch, main barriers to diabetes self care is memory issues as she admits to forgetting to take prandial dose at time hence Bg fluctuate between 150-400 at times   - No changes  - Will prescribed Dexcom, the freestyle libre was causing soreness of her arm as well as bleeding   MEDICATIONS: - Continue Tresiba 18 units daily  - Continue Novolog 10 units with Breakfast and lunch and 12 units with Supper - CF : Novolog (BG-135/35)   EDUCATION / INSTRUCTIONS: BG monitoring instructions: Patient is instructed to check her blood sugars 4 times a day, before meals and bedtime . Call [place] End[facility] clinic if: BG persistently < 70  I reviewed the Rule of 15 for the treatment of hypoglycemia in detail with the patient. Literature supplied.    F/U in 4 months    Signed electronically by: Mack Guise, MD  [facility] Endocrinology  [facility] [address] [place], [place] [address] Phone: [phone] FAX: [phone]   CC: Aurea Graff.Marlou Sa, [place] [address] [place] [address] Phone: [phone]  Fax: [phone]  Return to Endocrinology clinic as below: No future appointments.

## 2020-08-12 DIAGNOSIS — D631 Anemia in chronic kidney disease: Secondary | ICD-10-CM | POA: Diagnosis not present

## 2020-08-12 DIAGNOSIS — N2581 Secondary hyperparathyroidism of renal origin: Secondary | ICD-10-CM | POA: Diagnosis not present

## 2020-08-12 DIAGNOSIS — N186 End stage renal disease: Secondary | ICD-10-CM | POA: Diagnosis not present

## 2020-08-12 DIAGNOSIS — R52 Pain, unspecified: Secondary | ICD-10-CM | POA: Diagnosis not present

## 2020-08-12 DIAGNOSIS — Z992 Dependence on renal dialysis: Secondary | ICD-10-CM | POA: Diagnosis not present

## 2020-08-19 DIAGNOSIS — Z992 Dependence on renal dialysis: Secondary | ICD-10-CM | POA: Diagnosis not present

## 2020-08-19 DIAGNOSIS — N2581 Secondary hyperparathyroidism of renal origin: Secondary | ICD-10-CM | POA: Diagnosis not present

## 2020-08-19 DIAGNOSIS — N186 End stage renal disease: Secondary | ICD-10-CM | POA: Diagnosis not present

## 2020-08-20 ENCOUNTER — Other Ambulatory Visit (HOSPITAL_COMMUNITY): Payer: Self-pay | Admitting: Cardiology

## 2020-08-21 DIAGNOSIS — N186 End stage renal disease: Secondary | ICD-10-CM | POA: Diagnosis not present

## 2020-08-21 DIAGNOSIS — I129 Hypertensive chronic kidney disease with stage 1 through stage 4 chronic kidney disease, or unspecified chronic kidney disease: Secondary | ICD-10-CM | POA: Diagnosis not present

## 2020-08-21 DIAGNOSIS — Z992 Dependence on renal dialysis: Secondary | ICD-10-CM | POA: Diagnosis not present

## 2020-08-23 DIAGNOSIS — Z992 Dependence on renal dialysis: Secondary | ICD-10-CM | POA: Diagnosis not present

## 2020-08-23 DIAGNOSIS — N2581 Secondary hyperparathyroidism of renal origin: Secondary | ICD-10-CM | POA: Diagnosis not present

## 2020-08-23 DIAGNOSIS — N186 End stage renal disease: Secondary | ICD-10-CM | POA: Diagnosis not present

## 2020-08-26 DIAGNOSIS — N2581 Secondary hyperparathyroidism of renal origin: Secondary | ICD-10-CM | POA: Diagnosis not present

## 2020-08-26 DIAGNOSIS — R52 Pain, unspecified: Secondary | ICD-10-CM | POA: Diagnosis not present

## 2020-08-26 DIAGNOSIS — Z992 Dependence on renal dialysis: Secondary | ICD-10-CM | POA: Diagnosis not present

## 2020-08-26 DIAGNOSIS — N186 End stage renal disease: Secondary | ICD-10-CM | POA: Diagnosis not present

## 2020-09-02 DIAGNOSIS — D631 Anemia in chronic kidney disease: Secondary | ICD-10-CM | POA: Diagnosis not present

## 2020-09-02 DIAGNOSIS — N186 End stage renal disease: Secondary | ICD-10-CM | POA: Diagnosis not present

## 2020-09-02 DIAGNOSIS — N2581 Secondary hyperparathyroidism of renal origin: Secondary | ICD-10-CM | POA: Diagnosis not present

## 2020-09-02 DIAGNOSIS — Z992 Dependence on renal dialysis: Secondary | ICD-10-CM | POA: Diagnosis not present

## 2020-09-09 DIAGNOSIS — Z992 Dependence on renal dialysis: Secondary | ICD-10-CM | POA: Diagnosis not present

## 2020-09-09 DIAGNOSIS — D631 Anemia in chronic kidney disease: Secondary | ICD-10-CM | POA: Diagnosis not present

## 2020-09-09 DIAGNOSIS — N2581 Secondary hyperparathyroidism of renal origin: Secondary | ICD-10-CM | POA: Diagnosis not present

## 2020-09-09 DIAGNOSIS — N186 End stage renal disease: Secondary | ICD-10-CM | POA: Diagnosis not present

## 2020-09-10 ENCOUNTER — Other Ambulatory Visit (HOSPITAL_COMMUNITY): Payer: Self-pay | Admitting: Cardiology

## 2020-09-16 DIAGNOSIS — N186 End stage renal disease: Secondary | ICD-10-CM | POA: Diagnosis not present

## 2020-09-16 DIAGNOSIS — N2581 Secondary hyperparathyroidism of renal origin: Secondary | ICD-10-CM | POA: Diagnosis not present

## 2020-09-16 DIAGNOSIS — D631 Anemia in chronic kidney disease: Secondary | ICD-10-CM | POA: Diagnosis not present

## 2020-09-16 DIAGNOSIS — Z992 Dependence on renal dialysis: Secondary | ICD-10-CM | POA: Diagnosis not present

## 2020-09-21 DIAGNOSIS — N186 End stage renal disease: Secondary | ICD-10-CM | POA: Diagnosis not present

## 2020-09-21 DIAGNOSIS — I129 Hypertensive chronic kidney disease with stage 1 through stage 4 chronic kidney disease, or unspecified chronic kidney disease: Secondary | ICD-10-CM | POA: Diagnosis not present

## 2020-09-21 DIAGNOSIS — Z992 Dependence on renal dialysis: Secondary | ICD-10-CM | POA: Diagnosis not present

## 2020-09-22 ENCOUNTER — Other Ambulatory Visit: Payer: Self-pay

## 2020-09-22 DIAGNOSIS — N184 Chronic kidney disease, stage 4 (severe): Secondary | ICD-10-CM

## 2020-09-23 DIAGNOSIS — N2581 Secondary hyperparathyroidism of renal origin: Secondary | ICD-10-CM | POA: Diagnosis not present

## 2020-09-23 DIAGNOSIS — N186 End stage renal disease: Secondary | ICD-10-CM | POA: Diagnosis not present

## 2020-09-23 DIAGNOSIS — D631 Anemia in chronic kidney disease: Secondary | ICD-10-CM | POA: Diagnosis not present

## 2020-09-23 DIAGNOSIS — D509 Iron deficiency anemia, unspecified: Secondary | ICD-10-CM | POA: Diagnosis not present

## 2020-09-23 DIAGNOSIS — Z992 Dependence on renal dialysis: Secondary | ICD-10-CM | POA: Diagnosis not present

## 2020-09-30 DIAGNOSIS — Z992 Dependence on renal dialysis: Secondary | ICD-10-CM | POA: Diagnosis not present

## 2020-09-30 DIAGNOSIS — N2581 Secondary hyperparathyroidism of renal origin: Secondary | ICD-10-CM | POA: Diagnosis not present

## 2020-09-30 DIAGNOSIS — N186 End stage renal disease: Secondary | ICD-10-CM | POA: Diagnosis not present

## 2020-09-30 DIAGNOSIS — D631 Anemia in chronic kidney disease: Secondary | ICD-10-CM | POA: Diagnosis not present

## 2020-10-02 ENCOUNTER — Other Ambulatory Visit (HOSPITAL_COMMUNITY): Payer: Self-pay

## 2020-10-02 MED ORDER — METOPROLOL SUCCINATE ER 25 MG PO TB24: ORAL_TABLET | ORAL | 0 refills | Status: DC

## 2020-10-06 ENCOUNTER — Ambulatory Visit (HOSPITAL_COMMUNITY)
Admission: RE | Admit: 2020-10-06 | Discharge: 2020-10-06 | Disposition: A | Discharge status: Home / Self Care | Payer: HMO | Source: Ambulatory Visit | Attending: Surgery | Admitting: Surgery

## 2020-10-06 ENCOUNTER — Encounter: Payer: Self-pay | Admitting: Surgery

## 2020-10-06 ENCOUNTER — Ambulatory Visit: Payer: HMO | Admitting: Surgery

## 2020-10-06 ENCOUNTER — Other Ambulatory Visit: Payer: Self-pay

## 2020-10-06 VITALS — BP 136/69 | HR 71 | Temp 97.9°F | Resp 20 | Ht 62.0 in | Wt 198.0 lb

## 2020-10-06 DIAGNOSIS — N186 End stage renal disease: Secondary | ICD-10-CM

## 2020-10-06 DIAGNOSIS — N184 Chronic kidney disease, stage 4 (severe): Secondary | ICD-10-CM | POA: Diagnosis not present

## 2020-10-06 DIAGNOSIS — Z992 Dependence on renal dialysis: Secondary | ICD-10-CM | POA: Diagnosis not present

## 2020-10-06 NOTE — Progress Notes (Signed)
Vascular and Vein Specialist of [facility]  Patient name: Stephanie Woodward MRN: [id number] DOB: [birth year] Sex: female   REASON FOR VISIT:    Follow up   HISOTRY OF PRESENT ILLNESS:    Stephanie Woodward is a 80 y.o. female who is status post left brachiocephalic fistula on 09/29/8108.  She had no complications when she was seen back for follow-up 1 month after surgery.  She now reports a several month history of pain in her hand during dialysis.  She also reports bleeding after her needles come out.  She is on Eliquis   PAST MEDICAL HISTORY:   Past Medical History:  Diagnosis Date   Arthritis    Asthma    Atrial fibrillation (HCC)    CHF (congestive heart failure) (HCC)    CKD (chronic kidney disease), stage III (HCC)    COPD (chronic obstructive pulmonary disease) (HCC)    Diabetes mellitus    INSULIN DEPENDENT   Gout    Heart murmur    no issues per pt   History of kidney stones    Hyperlipemia    Hypertension    Psoriasis    Renal disorder    congenital   Single kidney    Sleep apnea    doesn't use the Cpap     FAMILY HISTORY:   Family History  Problem Relation Age of Onset   Lupus Daughter    Cancer Mother 31       type unknown   CVA Father 90   Cancer Brother 82       type unknown    SOCIAL HISTORY:   Social History   Tobacco Use   Smoking status: Former    Packs/day: 1.00    Years: 20.00    Pack years: 20.00    Types: Cigarettes    Quit date: 02/22/2009    Years since quitting: 11.6   Smokeless tobacco: Never  Substance Use Topics   Alcohol use: Yes    Alcohol/week: 0.0 standard drinks    Comment: occasional beer     ALLERGIES:   Allergies  Allergen Reactions   Eggs Or Egg-Derived Products Nausea And Vomiting   Lisinopril Nausea And Vomiting   Penicillins Nausea And Vomiting    Has patient had a PCN reaction causing immediate rash, facial/tongue/throat swelling, SOB or lightheadedness with  hypotension: No Has patient had a PCN reaction causing severe rash involving mucus membranes or skin necrosis: No Has patient had a PCN reaction that required hospitalization: No Has patient had a PCN reaction occurring within the last 10 years: No If all of the above answers are "NO", then may proceed with Cephalosporin use.    Other Other (See Comments)     CURRENT MEDICATIONS:   Current Outpatient Medications  Medication Sig Dispense Refill   albuterol (PROVENTIL HFA;VENTOLIN HFA) 108 (90 BASE) MCG/ACT inhaler Inhale 2 puffs into the lungs every 4 (four) hours as needed for shortness of breath.     atorvastatin (LIPITOR) 80 MG tablet Take 80 mg by mouth every evening.      B Complex-C-Zn-Folic Acid (DIALYVITE 315-XYVO 15) 0.8 MG TABS Take 1 tablet by mouth every evening.      betamethasone dipropionate 0.05 % cream Apply topically.     Blood Glucose Monitoring Suppl (FREESTYLE LITE) DEVI Use as instructed to check blood sugar 4 times daily 1 kit 0   budesonide-formoterol (SYMBICORT) 160-4.5 MCG/ACT inhaler Inhale 2 puffs into the lungs 2 (two) times daily. 1 each 0  Continuous Blood Gluc Receiver (FREESTYLE LIBRE READER) DEVI 1 kit by Does not apply route daily. Use as instructed E11.65 1 each 0   ELIQUIS 5 MG TABS tablet Take 1 tablet by mouth twice daily 180 tablet 3   fluticasone (CUTIVATE) 0.005 % ointment Apply 1 application topically 2 (two) times daily as needed (skin irritation/rash).   2   glucose blood (FREESTYLE LITE) test strip Use as instructed to check blood sugar 4 times daily 100 each 5   hydrALAZINE (APRESOLINE) 50 MG tablet Take 50 mg by mouth 3 (three) times daily.     insulin aspart (NOVOLOG FLEXPEN) 100 UNIT/ML FlexPen Inject 10 Units into the skin 2 (two) times daily with breakfast and lunch AND 12 Units daily with supper. (Patient taking differently: Inject 10 Units into the skin 2 (two) times daily with breakfast and lunch and 12 units with supper.) 15 mL 11    insulin degludec (TRESIBA FLEXTOUCH) 100 UNIT/ML FlexTouch Pen Inject 18 Units into the skin daily. 15 mL 6   Insulin Pen Needle 32G X 4 MM MISC 1 Device by Does not apply route in the morning, at noon, in the evening, and at bedtime. 400 each 3   isosorbide mononitrate (IMDUR) 30 MG 24 hr tablet Take 1 tablet by mouth once daily 90 tablet 3   ketoconazole (NIZORAL) 2 % cream Apply topically.     latanoprost (XALATAN) 0.005 % ophthalmic solution Place 1 drop into both eyes at bedtime.     lidocaine (LINDAMANTLE) 3 % CREA cream Apply 1 application topically as needed (Before dialysis on Tuesdays, Thursday, and Saturday.).     metoprolol succinate (TOPROL-XL) 25 MG 24 hr tablet TAKE 2 TABLETS BY MOUTH ONCE DAILY IN THE MORNING AND THEN TAKE 1 TABLET BY MOUTH ONCE DAILY IN THE EVENING 270 tablet 0   mirtazapine (REMERON) 15 MG tablet Take 15 mg by mouth at bedtime.     NARCAN 4 MG/0.1ML LIQD nasal spray kit Place 1 spray into the nose See admin instructions. Call EMS at first sign of opioid overdose. Place on spray in nose.  If no response, then place one spray ing the opposite side of the nose.     ondansetron (ZOFRAN) 4 MG tablet Take 4 mg by mouth 2 (two) times daily as needed for nausea.      pantoprazole (PROTONIX) 40 MG tablet Take 40 mg by mouth every evening.      sucroferric oxyhydroxide (VELPHORO) 500 MG chewable tablet Chew 500 mg by mouth See admin instructions. Crush or chew or swallow 1 tablet three times a day with meals and 1 tablet two times a day with snacks.     traMADol (ULTRAM) 50 MG tablet Take 50 mg by mouth every 6 (six) hours as needed for severe pain (for pain.).      triamcinolone cream (KENALOG) 0.1 % Apply 1 application topically 2 (two) times daily as needed (skin rash/irritation.).      No current facility-administered medications for this visit.    REVIEW OF SYSTEMS:   _0  denotes positive finding, _1  denotes negative finding Cardiac  Comments:  Chest pain or chest  pressure:    Shortness of breath upon exertion: x   Short of breath when lying flat: x   Irregular heart rhythm:        Vascular    Pain in calf, thigh, or hip brought on by ambulation:    Pain in feet at night that wakes you up from  your sleep:     Blood clot in your veins:    Leg swelling:         Pulmonary    Oxygen at home:    Productive cough:     Wheezing:  x       Neurologic    Sudden weakness in arms or legs:     Sudden numbness in arms or legs:     Sudden onset of difficulty speaking or slurred speech:    Temporary loss of vision in one eye:     Problems with dizziness:         Gastrointestinal    Blood in stool:     Vomited blood:         Genitourinary    Burning when urinating:     Blood in urine:        Psychiatric    Major depression:         Hematologic    Bleeding problems:    Problems with blood clotting too easily:        Skin    Rashes or ulcers:        Constitutional    Fever or chills:      PHYSICAL EXAM:   Vitals:   10/06/20 1423  BP: 136/69  Pulse: 71  Resp: 20  Temp: 97.9 F (36.6 C)  SpO2: 92%  Weight: 198 lb (89.8 kg)  Height: _0  (1.575 m)    GENERAL: The patient is a well-nourished female, in no acute distress. The vital signs are documented above. CARDIAC: There is a regular rate and rhythm.  VASCULAR: Palpable thrill within fistula but does not appear pulsatile.  No swelling in the arm or hand MUSCULOSKELETAL: There are no major deformities or cyanosis. NEUROLOGIC: No focal weakness or paresthesias are detected. SKIN: There are no ulcers or rashes noted. PSYCHIATRIC: The patient has a normal affect.  STUDIES:   I have reviewed the following studies: Left second digit pressure increases by 72 mmHg with compression of AVF.  Suggesting Steal syndrome.  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  She does not experience the symptoms when she is not on  dialysis.  Suspect this is related to hypotension during treatment.  She also has bleeding from her needle sites.  I proposed proceeding with a fistulogram to look for central venous stenosis and banding of her proximal fistula to help with her steal syndrome I discussed that there is a possibility this may not improve her symptoms.  I also told her to try to keep her pressure higher during dialysis.  The patient wishes to proceed however would like for me to talk to her daughter Oriya Kettering prior to scheduling this.  She will need to be off of her Eliquis prior to the procedure.    Leia Alf, MD, FACS [facility] Vein Specialists of [facility] [phone] Pager [phone]

## 2020-10-07 DIAGNOSIS — N186 End stage renal disease: Secondary | ICD-10-CM | POA: Diagnosis not present

## 2020-10-07 DIAGNOSIS — D509 Iron deficiency anemia, unspecified: Secondary | ICD-10-CM | POA: Diagnosis not present

## 2020-10-07 DIAGNOSIS — N2581 Secondary hyperparathyroidism of renal origin: Secondary | ICD-10-CM | POA: Diagnosis not present

## 2020-10-07 DIAGNOSIS — Z992 Dependence on renal dialysis: Secondary | ICD-10-CM | POA: Diagnosis not present

## 2020-10-14 DIAGNOSIS — N186 End stage renal disease: Secondary | ICD-10-CM | POA: Diagnosis not present

## 2020-10-14 DIAGNOSIS — Z992 Dependence on renal dialysis: Secondary | ICD-10-CM | POA: Diagnosis not present

## 2020-10-14 DIAGNOSIS — D631 Anemia in chronic kidney disease: Secondary | ICD-10-CM | POA: Diagnosis not present

## 2020-10-14 DIAGNOSIS — E8770 Fluid overload, unspecified: Secondary | ICD-10-CM | POA: Diagnosis not present

## 2020-10-14 DIAGNOSIS — N2581 Secondary hyperparathyroidism of renal origin: Secondary | ICD-10-CM | POA: Diagnosis not present

## 2020-10-15 ENCOUNTER — Ambulatory Visit (INDEPENDENT_AMBULATORY_CARE_PROVIDER_SITE_OTHER): Payer: HMO | Admitting: Surgery

## 2020-10-15 ENCOUNTER — Other Ambulatory Visit: Payer: Self-pay

## 2020-10-15 DIAGNOSIS — N186 End stage renal disease: Secondary | ICD-10-CM | POA: Diagnosis not present

## 2020-10-15 DIAGNOSIS — Z992 Dependence on renal dialysis: Secondary | ICD-10-CM

## 2020-10-17 ENCOUNTER — Encounter: Payer: Self-pay | Admitting: Surgery

## 2020-10-17 NOTE — Progress Notes (Signed)
Vascular and Vein Specialist of [facility]  Patient name: Stephanie Woodward MRN: [id number] DOB: 1941-01-15 Sex: female    Virtual Visit via Telephone Note   This visit type was conducted due to national recommendations for restrictions regarding the COVID-19 Pandemic (e.g. social distancing) in an effort to limit this patient's exposure and mitigate transmission in our community.  Due to her co-morbid illnesses, this patient is at least at moderate risk for complications without adequate follow up.  This format is felt to be most appropriate for this patient at this time.  The patient did not have access to video technology/had technical difficulties with video requiring transitioning to audio format only (telephone).  All issues noted in this document were discussed and addressed.  No physical exam could be performed with this format.   Patient Location: Home Provider Location: Office/Clinic    REASON FOR APPOINTMENT:    Follow up  HISTORY OF PRESENT ILLNESS:   Stephanie Woodward is a 80 y.o. female, who is status post left brachiocephalic fistula on 06/27/3974.  She had no complications when she was seen back for follow-up 1 month after surgery.  She now reports a several month history of pain in her hand during dialysis.  She also reports bleeding after her needles come out.  She is on Eliquis.  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  She wanted to further discuss this with her daughter.  I told her that even with these interventions, she may not get complete relief.  I spoke to the patient who was at dialysis, as well as her daughter.  The patient tells me that she is no longer having pain in her left hand.  She continues to have issues with bleeding after dialysis.   PAST MEDICAL  HISTORY    Past Medical History:  Diagnosis Date   Arthritis    Asthma    Atrial fibrillation (HCC)    CHF (congestive heart failure) (HCC)    CKD (chronic kidney disease), stage III (HCC)    COPD (chronic obstructive pulmonary disease) (HCC)    Diabetes mellitus    INSULIN DEPENDENT   Gout    Heart murmur    no issues per pt   History of kidney stones    Hyperlipemia    Hypertension    Psoriasis    Renal disorder    congenital   Single kidney    Sleep apnea    doesn't use the Cpap     FAMILY HISTORY   Family History  Problem Relation Age of Onset   Lupus Daughter    Cancer Mother 37       type unknown   CVA Father 63   Cancer Brother 110       type unknown    SOCIAL HISTORY:   Social History   Socioeconomic History   Marital status: Widowed    Spouse name: Not on file   Number of children: 2   Years of education: Not on file   Highest education level: Not on file  Occupational History   Occupation: retired  Tobacco Use   Smoking status: Former    Packs/day: 1.00    Years: 20.00    Pack years: 20.00  Types: Cigarettes    Quit date: 02/22/2009    Years since quitting: 11.6   Smokeless tobacco: Never  Vaping Use   Vaping Use: Never used  Substance and Sexual Activity   Alcohol use: Yes    Alcohol/week: 0.0 standard drinks    Comment: occasional beer   Drug use: Not Currently    Comment: marijuana in the past   Sexual activity: Never  Other Topics Concern   Not on file  Social History Narrative   Not on file   Social Determinants of Health   Financial Resource Strain: Not on file  Food Insecurity: No Food Insecurity   Worried About Running Out of Food in the Last Year: Never true   Ran Out of Food in the Last Year: Never true  Transportation Needs: No Transportation Needs   Lack of Transportation (Medical): No   Lack of Transportation (Non-Medical): No  Physical Activity: Not on file  Stress: Not on file  Social Connections: Not on  file  Intimate Partner Violence: Not on file    ALLERGIES:    Allergies  Allergen Reactions   Eggs Or Egg-Derived Products Nausea And Vomiting   Lisinopril Nausea And Vomiting   Penicillins Nausea And Vomiting    Has patient had a PCN reaction causing immediate rash, facial/tongue/throat swelling, SOB or lightheadedness with hypotension: No Has patient had a PCN reaction causing severe rash involving mucus membranes or skin necrosis: No Has patient had a PCN reaction that required hospitalization: No Has patient had a PCN reaction occurring within the last 10 years: No If all of the above answers are "NO", then may proceed with Cephalosporin use.    Other Other (See Comments)    CURRENT MEDICATIONS:    Current Outpatient Medications  Medication Sig Dispense Refill   albuterol (PROVENTIL HFA;VENTOLIN HFA) 108 (90 BASE) MCG/ACT inhaler Inhale 2 puffs into the lungs every 4 (four) hours as needed for shortness of breath.     atorvastatin (LIPITOR) 80 MG tablet Take 80 mg by mouth every evening.      B Complex-C-Zn-Folic Acid (DIALYVITE 950-DTOI 15) 0.8 MG TABS Take 1 tablet by mouth every evening.      betamethasone dipropionate 0.05 % cream Apply topically.     Blood Glucose Monitoring Suppl (FREESTYLE LITE) DEVI Use as instructed to check blood sugar 4 times daily 1 kit 0   budesonide-formoterol (SYMBICORT) 160-4.5 MCG/ACT inhaler Inhale 2 puffs into the lungs 2 (two) times daily. 1 each 0   Continuous Blood Gluc Receiver (FREESTYLE LIBRE READER) DEVI 1 kit by Does not apply route daily. Use as instructed E11.65 1 each 0   ELIQUIS 5 MG TABS tablet Take 1 tablet by mouth twice daily 180 tablet 3   fluticasone (CUTIVATE) 0.005 % ointment Apply 1 application topically 2 (two) times daily as needed (skin irritation/rash).   2   glucose blood (FREESTYLE LITE) test strip Use as instructed to check blood sugar 4 times daily 100 each 5   hydrALAZINE (APRESOLINE) 50 MG tablet Take 50 mg by  mouth 3 (three) times daily.     insulin aspart (NOVOLOG FLEXPEN) 100 UNIT/ML FlexPen Inject 10 Units into the skin 2 (two) times daily with breakfast and lunch AND 12 Units daily with supper. (Patient taking differently: Inject 10 Units into the skin 2 (two) times daily with breakfast and lunch and 12 units with supper.) 15 mL 11   insulin degludec (TRESIBA FLEXTOUCH) 100 UNIT/ML FlexTouch Pen Inject 18 Units into  the skin daily. 15 mL 6   Insulin Pen Needle 32G X 4 MM MISC 1 Device by Does not apply route in the morning, at noon, in the evening, and at bedtime. 400 each 3   isosorbide mononitrate (IMDUR) 30 MG 24 hr tablet Take 1 tablet by mouth once daily 90 tablet 3   ketoconazole (NIZORAL) 2 % cream Apply topically.     latanoprost (XALATAN) 0.005 % ophthalmic solution Place 1 drop into both eyes at bedtime.     lidocaine (LINDAMANTLE) 3 % CREA cream Apply 1 application topically as needed (Before dialysis on Tuesdays, Thursday, and Saturday.).     metoprolol succinate (TOPROL-XL) 25 MG 24 hr tablet TAKE 2 TABLETS BY MOUTH ONCE DAILY IN THE MORNING AND THEN TAKE 1 TABLET BY MOUTH ONCE DAILY IN THE EVENING 270 tablet 0   mirtazapine (REMERON) 15 MG tablet Take 15 mg by mouth at bedtime.     NARCAN 4 MG/0.1ML LIQD nasal spray kit Place 1 spray into the nose See admin instructions. Call EMS at first sign of opioid overdose. Place on spray in nose.  If no response, then place one spray ing the opposite side of the nose.     ondansetron (ZOFRAN) 4 MG tablet Take 4 mg by mouth 2 (two) times daily as needed for nausea.      pantoprazole (PROTONIX) 40 MG tablet Take 40 mg by mouth every evening.      sucroferric oxyhydroxide (VELPHORO) 500 MG chewable tablet Chew 500 mg by mouth See admin instructions. Crush or chew or swallow 1 tablet three times a day with meals and 1 tablet two times a day with snacks.     traMADol (ULTRAM) 50 MG tablet Take 50 mg by mouth every 6 (six) hours as needed for severe pain  (for pain.).      triamcinolone cream (KENALOG) 0.1 % Apply 1 application topically 2 (two) times daily as needed (skin rash/irritation.).      No current facility-administered medications for this visit.    REVIEW OF SYSTEMS:   Please see the history of present illness.     All other systems reviewed and are negative.  PHYSICAL EXAM:   There were no vitals filed for this visit.    Recent Labs: 01/11/2020: TSH 1.492 06/08/2020: B Natriuretic Peptide 402.0; BUN 21; Creatinine, Ser 4.19; Magnesium 2.0; Potassium 3.8; Sodium 138 07/16/2020: ALT 14; Hemoglobin 11.8; Platelets 235   Recent Lipid Panel Lab Results  Component Value Date/Time   CHOL 183 07/16/2020 11:23 AM   TRIG 157 (H) 07/16/2020 11:23 AM   HDL 56 07/16/2020 11:23 AM   CHOLHDL 3.3 07/16/2020 11:23 AM   LDLCALC 96 07/16/2020 11:23 AM    Wt Readings from Last 3 Encounters:  10/06/20 198 lb (89.8 kg)  08/08/20 194 lb (88 kg)  07/16/20 193 lb 9.6 oz (87.8 kg)     STUDIES:   none  ASSESSMENT and PLAN   ESRD:  I spoke with the patient and her daughter.  The patient is not having pain in her hand anymore, however she continues to have bleeding once the needles were removed from her dialysis access.  Therefore, I do not think that she needs to have a banding procedure to help with steal syndrome.  I do think that we should proceed with fistulogram given her issues with bleeding.  I did tell her that the bleeding could be the result of her Eliquis, however we need to rule out a central  venous stenosis.  We will try to get this scheduled in the near future.  As a precaution I will just stop her Eliquis prior to her procedure.   Time:   Today, I have spent 11 minutes with the patient with telehealth technology discussing the above problems.    Leia Alf, MD, FACS [facility] of [facility] [phone] Pager [phone]

## 2020-10-17 NOTE — H&P (View-Only) (Signed)
[facility] and [facility]in Specialist of [facility]  Patient name: Stephanie Woodward MRN: [id number] DOB: 21-Dec-1940 Sex: female    Virtual Visit via Telephone Note   This visit type was conducted due to national recommendations for restrictions regarding the COVID-19 Pandemic (e.g. social distancing) in an effort to limit this patient's exposure and mitigate transmission in our community.  Due to her co-morbid illnesses, this patient is at least at moderate risk for complications without adequate follow up.  This format is felt to be most appropriate for this patient at this time.  The patient did not have access to video technology/had technical difficulties with video requiring transitioning to audio format only (telephone).  All issues noted in this document were discussed and addressed.  No physical exam could be performed with this format.   Patient Location: Home Provider Location: Office/Clinic    REASON FOR APPOINTMENT:    Follow up  HISTORY OF PRESENT ILLNESS:   Stephanie Woodward is a 80 y.o. female, who is status post left brachiocephalic fistula on 04/07/863.  She had no complications when she was seen back for follow-up 1 month after surgery.  She now reports a several month history of pain in her hand during dialysis.  She also reports bleeding after her needles come out.  She is on Eliquis.  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  She wanted to further discuss this with her daughter.  I told her that even with these interventions, she may not get complete relief.  I spoke to the patient who was at dialysis, as well as her daughter.  The patient tells me that she is no longer having pain in her left hand.  She continues to have issues with bleeding after dialysis.   PAST MEDICAL  HISTORY    Past Medical History:  Diagnosis Date   Arthritis    Asthma    Atrial fibrillation (HCC)    CHF (congestive heart failure) (HCC)    CKD (chronic kidney disease), stage III (HCC)    COPD (chronic obstructive pulmonary disease) (HCC)    Diabetes mellitus    INSULIN DEPENDENT   Gout    Heart murmur    no issues per pt   History of kidney stones    Hyperlipemia    Hypertension    Psoriasis    Renal disorder    congenital   Single kidney    Sleep apnea    doesn't use the Cpap     FAMILY HISTORY   Family History  Problem Relation Age of Onset   Lupus Daughter    Cancer Mother 58       type unknown   CVA Father 86   Cancer Brother 50       type unknown    SOCIAL HISTORY:   Social History   Socioeconomic History   Marital status: Widowed    Spouse name: Not on file   Number of children: 2   Years of education: Not on file   Highest education level: Not on file  Occupational History   Occupation: retired  Tobacco Use   Smoking status: Former    Packs/day: 1.00    Years: 20.00    Pack years: 20.00  Types: Cigarettes    Quit date: 02/22/2009    Years since quitting: 11.6   Smokeless tobacco: Never  Vaping Use   Vaping Use: Never used  Substance and Sexual Activity   Alcohol use: Yes    Alcohol/week: 0.0 standard drinks    Comment: occasional beer   Drug use: Not Currently    Comment: marijuana in the past   Sexual activity: Never  Other Topics Concern   Not on file  Social History Narrative   Not on file   Social Determinants of Health   Financial Resource Strain: Not on file  Food Insecurity: No Food Insecurity   Worried About Running Out of Food in the Last Year: Never true   Ran Out of Food in the Last Year: Never true  Transportation Needs: No Transportation Needs   Lack of Transportation (Medical): No   Lack of Transportation (Non-Medical): No  Physical Activity: Not on file  Stress: Not on file  Social Connections: Not on  file  Intimate Partner Violence: Not on file    ALLERGIES:    Allergies  Allergen Reactions   Eggs Or Egg-Derived Products Nausea And Vomiting   Lisinopril Nausea And Vomiting   Penicillins Nausea And Vomiting    Has patient had a PCN reaction causing immediate rash, facial/tongue/throat swelling, SOB or lightheadedness with hypotension: No Has patient had a PCN reaction causing severe rash involving mucus membranes or skin necrosis: No Has patient had a PCN reaction that required hospitalization: No Has patient had a PCN reaction occurring within the last 10 years: No If all of the above answers are "NO", then may proceed with Cephalosporin use.    Other Other (See Comments)    CURRENT MEDICATIONS:    Current Outpatient Medications  Medication Sig Dispense Refill   albuterol (PROVENTIL HFA;VENTOLIN HFA) 108 (90 BASE) MCG/ACT inhaler Inhale 2 puffs into the lungs every 4 (four) hours as needed for shortness of breath.     atorvastatin (LIPITOR) 80 MG tablet Take 80 mg by mouth every evening.      B Complex-C-Zn-Folic Acid (DIALYVITE 622-WLNL 15) 0.8 MG TABS Take 1 tablet by mouth every evening.      betamethasone dipropionate 0.05 % cream Apply topically.     Blood Glucose Monitoring Suppl (FREESTYLE LITE) DEVI Use as instructed to check blood sugar 4 times daily 1 kit 0   budesonide-formoterol (SYMBICORT) 160-4.5 MCG/ACT inhaler Inhale 2 puffs into the lungs 2 (two) times daily. 1 each 0   Continuous Blood Gluc Receiver (FREESTYLE LIBRE READER) DEVI 1 kit by Does not apply route daily. Use as instructed E11.65 1 each 0   ELIQUIS 5 MG TABS tablet Take 1 tablet by mouth twice daily 180 tablet 3   fluticasone (CUTIVATE) 0.005 % ointment Apply 1 application topically 2 (two) times daily as needed (skin irritation/rash).   2   glucose blood (FREESTYLE LITE) test strip Use as instructed to check blood sugar 4 times daily 100 each 5   hydrALAZINE (APRESOLINE) 50 MG tablet Take 50 mg by  mouth 3 (three) times daily.     insulin aspart (NOVOLOG FLEXPEN) 100 UNIT/ML FlexPen Inject 10 Units into the skin 2 (two) times daily with breakfast and lunch AND 12 Units daily with supper. (Patient taking differently: Inject 10 Units into the skin 2 (two) times daily with breakfast and lunch and 12 units with supper.) 15 mL 11   insulin degludec (TRESIBA FLEXTOUCH) 100 UNIT/ML FlexTouch Pen Inject 18 Units into  the skin daily. 15 mL 6   Insulin Pen Needle 32G X 4 MM MISC 1 Device by Does not apply route in the morning, at noon, in the evening, and at bedtime. 400 each 3   isosorbide mononitrate (IMDUR) 30 MG 24 hr tablet Take 1 tablet by mouth once daily 90 tablet 3   ketoconazole (NIZORAL) 2 % cream Apply topically.     latanoprost (XALATAN) 0.005 % ophthalmic solution Place 1 drop into both eyes at bedtime.     lidocaine (LINDAMANTLE) 3 % CREA cream Apply 1 application topically as needed (Before dialysis on Tuesdays, Thursday, and Saturday.).     metoprolol succinate (TOPROL-XL) 25 MG 24 hr tablet TAKE 2 TABLETS BY MOUTH ONCE DAILY IN THE MORNING AND THEN TAKE 1 TABLET BY MOUTH ONCE DAILY IN THE EVENING 270 tablet 0   mirtazapine (REMERON) 15 MG tablet Take 15 mg by mouth at bedtime.     NARCAN 4 MG/0.1ML LIQD nasal spray kit Place 1 spray into the nose See admin instructions. Call EMS at first sign of opioid overdose. Place on spray in nose.  If no response, then place one spray ing the opposite side of the nose.     ondansetron (ZOFRAN) 4 MG tablet Take 4 mg by mouth 2 (two) times daily as needed for nausea.      pantoprazole (PROTONIX) 40 MG tablet Take 40 mg by mouth every evening.      sucroferric oxyhydroxide (VELPHORO) 500 MG chewable tablet Chew 500 mg by mouth See admin instructions. Crush or chew or swallow 1 tablet three times a day with meals and 1 tablet two times a day with snacks.     traMADol (ULTRAM) 50 MG tablet Take 50 mg by mouth every 6 (six) hours as needed for severe pain  (for pain.).      triamcinolone cream (KENALOG) 0.1 % Apply 1 application topically 2 (two) times daily as needed (skin rash/irritation.).      No current facility-administered medications for this visit.    REVIEW OF SYSTEMS:   Please see the history of present illness.     All other systems reviewed and are negative.  PHYSICAL EXAM:   There were no vitals filed for this visit.    Recent Labs: 01/11/2020: TSH 1.492 06/08/2020: B Natriuretic Peptide 402.0; BUN 21; Creatinine, Ser 4.19; Magnesium 2.0; Potassium 3.8; Sodium 138 07/16/2020: ALT 14; Hemoglobin 11.8; Platelets 235   Recent Lipid Panel Lab Results  Component Value Date/Time   CHOL 183 07/16/2020 11:23 AM   TRIG 157 (H) 07/16/2020 11:23 AM   HDL 56 07/16/2020 11:23 AM   CHOLHDL 3.3 07/16/2020 11:23 AM   LDLCALC 96 07/16/2020 11:23 AM    Wt Readings from Last 3 Encounters:  10/06/20 198 lb (89.8 kg)  08/08/20 194 lb (88 kg)  07/16/20 193 lb 9.6 oz (87.8 kg)     STUDIES:   none  ASSESSMENT and PLAN   ESRD:  I spoke with the patient and her daughter.  The patient is not having pain in her hand anymore, however she continues to have bleeding once the needles were removed from her dialysis access.  Therefore, I do not think that she needs to have a banding procedure to help with steal syndrome.  I do think that we should proceed with fistulogram given her issues with bleeding.  I did tell her that the bleeding could be the result of her Eliquis, however we need to rule out a central  venous stenosis.  We will try to get this scheduled in the near future.  As a precaution I will just stop her Eliquis prior to her procedure.   Time:   Today, I have spent 11 minutes with the patient with telehealth technology discussing the above problems.    Leia Alf, MD, FACS [facility] [phone] Pager [phone]

## 2020-10-21 DIAGNOSIS — Z992 Dependence on renal dialysis: Secondary | ICD-10-CM | POA: Diagnosis not present

## 2020-10-21 DIAGNOSIS — D509 Iron deficiency anemia, unspecified: Secondary | ICD-10-CM | POA: Diagnosis not present

## 2020-10-21 DIAGNOSIS — N186 End stage renal disease: Secondary | ICD-10-CM | POA: Diagnosis not present

## 2020-10-21 DIAGNOSIS — N2581 Secondary hyperparathyroidism of renal origin: Secondary | ICD-10-CM | POA: Diagnosis not present

## 2020-10-22 DIAGNOSIS — I129 Hypertensive chronic kidney disease with stage 1 through stage 4 chronic kidney disease, or unspecified chronic kidney disease: Secondary | ICD-10-CM | POA: Diagnosis not present

## 2020-10-22 DIAGNOSIS — Z992 Dependence on renal dialysis: Secondary | ICD-10-CM | POA: Diagnosis not present

## 2020-10-22 DIAGNOSIS — N186 End stage renal disease: Secondary | ICD-10-CM | POA: Diagnosis not present

## 2020-10-23 ENCOUNTER — Other Ambulatory Visit: Payer: Self-pay

## 2020-10-23 DIAGNOSIS — N186 End stage renal disease: Secondary | ICD-10-CM | POA: Diagnosis not present

## 2020-10-23 DIAGNOSIS — D631 Anemia in chronic kidney disease: Secondary | ICD-10-CM | POA: Diagnosis not present

## 2020-10-23 DIAGNOSIS — N2581 Secondary hyperparathyroidism of renal origin: Secondary | ICD-10-CM | POA: Diagnosis not present

## 2020-10-23 DIAGNOSIS — Z992 Dependence on renal dialysis: Secondary | ICD-10-CM | POA: Diagnosis not present

## 2020-10-28 DIAGNOSIS — D631 Anemia in chronic kidney disease: Secondary | ICD-10-CM | POA: Diagnosis not present

## 2020-10-28 DIAGNOSIS — Z992 Dependence on renal dialysis: Secondary | ICD-10-CM | POA: Diagnosis not present

## 2020-10-28 DIAGNOSIS — N2581 Secondary hyperparathyroidism of renal origin: Secondary | ICD-10-CM | POA: Diagnosis not present

## 2020-10-28 DIAGNOSIS — N186 End stage renal disease: Secondary | ICD-10-CM | POA: Diagnosis not present

## 2020-10-28 DIAGNOSIS — D509 Iron deficiency anemia, unspecified: Secondary | ICD-10-CM | POA: Diagnosis not present

## 2020-10-29 ENCOUNTER — Telehealth: Payer: Self-pay

## 2020-10-29 NOTE — Telephone Encounter (Signed)
Patient calls today to report that she forgot about not taking her Eliquis prior to surgery on Friday. Discussed with provider - okay to proceed with surgery as long as she does not take any more prior to Friday. Patient verbalizes understanding.

## 2020-10-30 ENCOUNTER — Telehealth: Payer: Self-pay | Admitting: *Deleted

## 2020-10-30 NOTE — Telephone Encounter (Signed)
Pt called to clarify the medications she can take before fistulogram tomorrow. Confirmed with pt that she has not been taking her eliquis since she last spoke with our triage nurse. (Note in epic). Instructed pt to take half her insulin tonight and tomorrow morning. She can take her other medications with a sip of water. Pt verbalized understanding.

## 2020-10-31 ENCOUNTER — Other Ambulatory Visit: Payer: Self-pay

## 2020-10-31 ENCOUNTER — Ambulatory Visit (HOSPITAL_COMMUNITY)
Admission: RE | Admit: 2020-10-31 | Discharge: 2020-10-31 | Disposition: A | Discharge status: Home / Self Care | Payer: HMO | Attending: Vascular Surgery | Admitting: Vascular Surgery

## 2020-10-31 ENCOUNTER — Encounter (HOSPITAL_COMMUNITY)
Admission: RE | Disposition: A | Discharge status: Home / Self Care | Payer: Self-pay | Source: Home / Self Care | Attending: Vascular Surgery

## 2020-10-31 ENCOUNTER — Encounter (HOSPITAL_COMMUNITY): Payer: Self-pay | Admitting: Vascular Surgery

## 2020-10-31 DIAGNOSIS — Z794 Long term (current) use of insulin: Secondary | ICD-10-CM | POA: Insufficient documentation

## 2020-10-31 DIAGNOSIS — T82510A Breakdown (mechanical) of surgically created arteriovenous fistula, initial encounter: Secondary | ICD-10-CM | POA: Insufficient documentation

## 2020-10-31 DIAGNOSIS — Z7984 Long term (current) use of oral hypoglycemic drugs: Secondary | ICD-10-CM | POA: Insufficient documentation

## 2020-10-31 DIAGNOSIS — Z87891 Personal history of nicotine dependence: Secondary | ICD-10-CM | POA: Insufficient documentation

## 2020-10-31 DIAGNOSIS — E1122 Type 2 diabetes mellitus with diabetic chronic kidney disease: Secondary | ICD-10-CM | POA: Diagnosis not present

## 2020-10-31 DIAGNOSIS — Z79899 Other long term (current) drug therapy: Secondary | ICD-10-CM | POA: Insufficient documentation

## 2020-10-31 DIAGNOSIS — I509 Heart failure, unspecified: Secondary | ICD-10-CM | POA: Diagnosis not present

## 2020-10-31 DIAGNOSIS — Z880 Allergy status to penicillin: Secondary | ICD-10-CM | POA: Insufficient documentation

## 2020-10-31 DIAGNOSIS — I132 Hypertensive heart and chronic kidney disease with heart failure and with stage 5 chronic kidney disease, or end stage renal disease: Secondary | ICD-10-CM | POA: Diagnosis not present

## 2020-10-31 DIAGNOSIS — Z7951 Long term (current) use of inhaled steroids: Secondary | ICD-10-CM | POA: Diagnosis not present

## 2020-10-31 DIAGNOSIS — Z888 Allergy status to other drugs, medicaments and biological substances status: Secondary | ICD-10-CM | POA: Insufficient documentation

## 2020-10-31 DIAGNOSIS — Z992 Dependence on renal dialysis: Secondary | ICD-10-CM | POA: Insufficient documentation

## 2020-10-31 DIAGNOSIS — N186 End stage renal disease: Secondary | ICD-10-CM | POA: Insufficient documentation

## 2020-10-31 DIAGNOSIS — Y841 Kidney dialysis as the cause of abnormal reaction of the patient, or of later complication, without mention of misadventure at the time of the procedure: Secondary | ICD-10-CM | POA: Diagnosis not present

## 2020-10-31 DIAGNOSIS — Z91012 Allergy to eggs: Secondary | ICD-10-CM | POA: Diagnosis not present

## 2020-10-31 DIAGNOSIS — T82858A Stenosis of vascular prosthetic devices, implants and grafts, initial encounter: Secondary | ICD-10-CM | POA: Diagnosis not present

## 2020-10-31 DIAGNOSIS — Z7901 Long term (current) use of anticoagulants: Secondary | ICD-10-CM | POA: Diagnosis not present

## 2020-10-31 HISTORY — PX: A/V FISTULAGRAM: CATH118298

## 2020-10-31 HISTORY — PX: PERIPHERAL VASCULAR INTERVENTION: CATH118257

## 2020-10-31 LAB — POCT I-STAT, CHEM 8
BUN: 22 mg/dL (ref 8–23)
Calcium, Ion: 1.14 mmol/L — ABNORMAL LOW (ref 1.15–1.40)
Chloride: 95 mmol/L — ABNORMAL LOW (ref 98–111)
Creatinine, Ser: 4.1 mg/dL — ABNORMAL HIGH (ref 0.44–1.00)
Glucose, Bld: 174 mg/dL — ABNORMAL HIGH (ref 70–99)
HCT: 35 % — ABNORMAL LOW (ref 36.0–46.0)
Hemoglobin: 11.9 g/dL — ABNORMAL LOW (ref 12.0–15.0)
Potassium: 3.6 mmol/L (ref 3.5–5.1)
Sodium: 139 mmol/L (ref 135–145)
TCO2: 34 mmol/L — ABNORMAL HIGH (ref 22–32)

## 2020-10-31 SURGERY — A/V FISTULAGRAM
Anesthesia: LOCAL | Laterality: Left

## 2020-10-31 MED ORDER — SODIUM CHLORIDE 0.9% FLUSH: 3.0000 mL | INTRAVENOUS | Status: DC | PRN

## 2020-10-31 MED ORDER — IODIXANOL 320 MG/ML IV SOLN
INTRAVENOUS | Status: DC | PRN
  Administered 2020-10-31: 40 mL

## 2020-10-31 MED ORDER — LIDOCAINE HCL (PF) 1 % IJ SOLN
INTRAMUSCULAR | Status: AC
  Filled 2020-10-31: qty 30

## 2020-10-31 MED ORDER — HEPARIN SODIUM (PORCINE) 1000 UNIT/ML IJ SOLN
INTRAMUSCULAR | Status: DC | PRN
  Administered 2020-10-31: 2000 [IU] via INTRAVENOUS

## 2020-10-31 MED ORDER — LIDOCAINE HCL (PF) 1 % IJ SOLN
INTRAMUSCULAR | Status: DC | PRN
  Administered 2020-10-31: 2 mL via INTRADERMAL

## 2020-10-31 MED ORDER — SODIUM CHLORIDE 0.9 % IV SOLN: 250.0000 mL | INTRAVENOUS | Status: DC | PRN

## 2020-10-31 MED ORDER — SODIUM CHLORIDE 0.9% FLUSH: 3.0000 mL | Freq: Two times a day (BID) | INTRAVENOUS | Status: DC

## 2020-10-31 MED ORDER — HEPARIN SODIUM (PORCINE) 1000 UNIT/ML IJ SOLN
INTRAMUSCULAR | Status: AC
  Filled 2020-10-31: qty 1

## 2020-10-31 MED ORDER — HEPARIN (PORCINE) IN NACL 1000-0.9 UT/500ML-% IV SOLN
INTRAVENOUS | Status: AC
  Filled 2020-10-31: qty 500

## 2020-10-31 MED ORDER — HEPARIN (PORCINE) IN NACL 1000-0.9 UT/500ML-% IV SOLN
INTRAVENOUS | Status: DC | PRN
  Administered 2020-10-31: 500 mL

## 2020-10-31 SURGICAL SUPPLY — 14 items
BAG SNAP BAND KOVER 36X36 (MISCELLANEOUS) ×2 IMPLANT
BALLN MUSTANG 7.0X40 75 (BALLOONS) ×2
BALLOON MUSTANG 7.0X40 75 (BALLOONS) IMPLANT
COVER DOME SNAP 22 D (MISCELLANEOUS) ×2 IMPLANT
KIT ENCORE 26 ADVANTAGE (KITS) ×1 IMPLANT
KIT MICROPUNCTURE NIT STIFF (SHEATH) ×1 IMPLANT
PROTECTION STATION PRESSURIZED (MISCELLANEOUS) ×2
SHEATH PINNACLE R/O II 6F 4CM (SHEATH) ×1 IMPLANT
SHEATH PROBE COVER 6X72 (BAG) ×2 IMPLANT
STATION PROTECTION PRESSURIZED (MISCELLANEOUS) ×1 IMPLANT
STOPCOCK MORSE 400PSI 3WAY (MISCELLANEOUS) ×2 IMPLANT
TRAY PV CATH (CUSTOM PROCEDURE TRAY) ×2 IMPLANT
TUBING CIL FLEX 10 FLL-RA (TUBING) ×2 IMPLANT
WIRE STARTER BENTSON 035X150 (WIRE) ×1 IMPLANT

## 2020-10-31 NOTE — Progress Notes (Signed)
Pt ambulated without difficulty or bleeding.   Discharged home with her daughter who will drive and stay with pt x 24 hrs. 

## 2020-10-31 NOTE — Interval H&P Note (Signed)
History and Physical Interval Note:  10/31/2020 7:24 AM  Stephanie Woodward  has presented today for surgery, with the diagnosis of end stage renal disease.  The various methods of treatment have been discussed with the patient and family. After consideration of risks, benefits and other options for treatment, the patient has consented to  Procedure(s): A/V FISTULAGRAM (Left) as a surgical intervention.  The patient's history has been reviewed, patient examined, no change in status, stable for surgery.  I have reviewed the patient's chart and labs.  Questions were answered to the patient's satisfaction.     [name]

## 2020-10-31 NOTE — Op Note (Signed)
   PATIENT: Stephanie Woodward      MRN: [id number] DOB: [birth year]    DATE OF PROCEDURE: 10/31/2020  INDICATIONS:    Stephanie Woodward is a 80 y.o. female who was having problems with bleeding when her dialysis needles were removed.  She was set up for a fistulogram.  PROCEDURE:    Ultrasound-guided access to left brachiocephalic fistula Fistulogram left brachiocephalic fistula Angioplasty left brachiocephalic fistula  SURGEON: Judeth Cornfield. Scot Dock, MD, FACS  ANESTHESIA: Local  EBL: Minimal  TECHNIQUE: The patient was brought to the [facility] lab.  The left arm was prepped and draped in usual sterile fashion.  Under ultrasound guidance, after the skin was anesthetized, I cannulated the proximal fistula with a micropuncture needle and a micropuncture sheath was introduced over the wire.  A fistulogram was then obtained to evaluate the central veins and the entire fistula.  The only area of concern was a potential weblike stenosis in the central portion of the fistula.  I elected to address this with balloon angioplasty.  I exchanged the micropuncture sheath for a short 6 French sheath over a Bentson wire.  The patient received 2000 units of IV heparin.  I selected a 7 mm x 4 cm balloon which was positioned across the area of concern and inflated to 20 atm.  There was really no significant waist noted.  There was one competing branch here that was not especially large.  With the balloon inflated we did obtain a reflux shot and I did not see any problems in the proximal fistula or at the anastomosis.  At the completion the balloon and wire were removed.  A 4-0 Monocryl was placed around the cannulation site.  There was good hemostasis.  The patient was transferred to the holding area.  No immediate complications were noted.  FINDINGS:   No central venous stenosis 1 potential weblike area of stenosis in the central fistula but there was no significant waist noted with angioplasty. No problems at the proximal  fistula or anastomosis.  [name], MD, FACS [facility]  DATE OF DICTATION:   10/31/2020

## 2020-11-03 DIAGNOSIS — N2581 Secondary hyperparathyroidism of renal origin: Secondary | ICD-10-CM | POA: Diagnosis not present

## 2020-11-03 DIAGNOSIS — N186 End stage renal disease: Secondary | ICD-10-CM | POA: Diagnosis not present

## 2020-11-03 DIAGNOSIS — D631 Anemia in chronic kidney disease: Secondary | ICD-10-CM | POA: Diagnosis not present

## 2020-11-03 DIAGNOSIS — Z992 Dependence on renal dialysis: Secondary | ICD-10-CM | POA: Diagnosis not present

## 2020-11-05 ENCOUNTER — Other Ambulatory Visit: Payer: Self-pay | Admitting: Internal Medicine

## 2020-11-10 ENCOUNTER — Ambulatory Visit: Payer: HMO | Admitting: Surgery

## 2020-11-10 DIAGNOSIS — G894 Chronic pain syndrome: Secondary | ICD-10-CM | POA: Diagnosis not present

## 2020-11-10 DIAGNOSIS — M5136 Other intervertebral disc degeneration, lumbar region: Secondary | ICD-10-CM | POA: Diagnosis not present

## 2020-11-10 DIAGNOSIS — M259 Joint disorder, unspecified: Secondary | ICD-10-CM | POA: Diagnosis not present

## 2020-11-10 DIAGNOSIS — M545 Low back pain, unspecified: Secondary | ICD-10-CM | POA: Diagnosis not present

## 2020-11-11 DIAGNOSIS — Z992 Dependence on renal dialysis: Secondary | ICD-10-CM | POA: Diagnosis not present

## 2020-11-11 DIAGNOSIS — N186 End stage renal disease: Secondary | ICD-10-CM | POA: Diagnosis not present

## 2020-11-11 DIAGNOSIS — N2581 Secondary hyperparathyroidism of renal origin: Secondary | ICD-10-CM | POA: Diagnosis not present

## 2020-11-11 DIAGNOSIS — D631 Anemia in chronic kidney disease: Secondary | ICD-10-CM | POA: Diagnosis not present

## 2020-11-11 DIAGNOSIS — D509 Iron deficiency anemia, unspecified: Secondary | ICD-10-CM | POA: Diagnosis not present

## 2020-11-18 DIAGNOSIS — D509 Iron deficiency anemia, unspecified: Secondary | ICD-10-CM | POA: Diagnosis not present

## 2020-11-18 DIAGNOSIS — D631 Anemia in chronic kidney disease: Secondary | ICD-10-CM | POA: Diagnosis not present

## 2020-11-18 DIAGNOSIS — N2581 Secondary hyperparathyroidism of renal origin: Secondary | ICD-10-CM | POA: Diagnosis not present

## 2020-11-18 DIAGNOSIS — Z992 Dependence on renal dialysis: Secondary | ICD-10-CM | POA: Diagnosis not present

## 2020-11-18 DIAGNOSIS — R52 Pain, unspecified: Secondary | ICD-10-CM | POA: Diagnosis not present

## 2020-11-18 DIAGNOSIS — N186 End stage renal disease: Secondary | ICD-10-CM | POA: Diagnosis not present

## 2020-11-21 DIAGNOSIS — N186 End stage renal disease: Secondary | ICD-10-CM | POA: Diagnosis not present

## 2020-11-21 DIAGNOSIS — Z992 Dependence on renal dialysis: Secondary | ICD-10-CM | POA: Diagnosis not present

## 2020-11-21 DIAGNOSIS — I129 Hypertensive chronic kidney disease with stage 1 through stage 4 chronic kidney disease, or unspecified chronic kidney disease: Secondary | ICD-10-CM | POA: Diagnosis not present

## 2020-11-22 DIAGNOSIS — Z992 Dependence on renal dialysis: Secondary | ICD-10-CM | POA: Diagnosis not present

## 2020-11-22 DIAGNOSIS — N186 End stage renal disease: Secondary | ICD-10-CM | POA: Diagnosis not present

## 2020-11-22 DIAGNOSIS — N2581 Secondary hyperparathyroidism of renal origin: Secondary | ICD-10-CM | POA: Diagnosis not present

## 2020-11-25 DIAGNOSIS — E1122 Type 2 diabetes mellitus with diabetic chronic kidney disease: Secondary | ICD-10-CM | POA: Diagnosis not present

## 2020-11-25 DIAGNOSIS — Z992 Dependence on renal dialysis: Secondary | ICD-10-CM | POA: Diagnosis not present

## 2020-11-25 DIAGNOSIS — D509 Iron deficiency anemia, unspecified: Secondary | ICD-10-CM | POA: Diagnosis not present

## 2020-11-25 DIAGNOSIS — R52 Pain, unspecified: Secondary | ICD-10-CM | POA: Diagnosis not present

## 2020-11-25 DIAGNOSIS — N186 End stage renal disease: Secondary | ICD-10-CM | POA: Diagnosis not present

## 2020-11-25 DIAGNOSIS — D631 Anemia in chronic kidney disease: Secondary | ICD-10-CM | POA: Diagnosis not present

## 2020-11-25 DIAGNOSIS — N2581 Secondary hyperparathyroidism of renal origin: Secondary | ICD-10-CM | POA: Diagnosis not present

## 2020-12-02 DIAGNOSIS — N186 End stage renal disease: Secondary | ICD-10-CM | POA: Diagnosis not present

## 2020-12-02 DIAGNOSIS — D631 Anemia in chronic kidney disease: Secondary | ICD-10-CM | POA: Diagnosis not present

## 2020-12-02 DIAGNOSIS — Z992 Dependence on renal dialysis: Secondary | ICD-10-CM | POA: Diagnosis not present

## 2020-12-02 DIAGNOSIS — N2581 Secondary hyperparathyroidism of renal origin: Secondary | ICD-10-CM | POA: Diagnosis not present

## 2020-12-02 DIAGNOSIS — Z23 Encounter for immunization: Secondary | ICD-10-CM | POA: Diagnosis not present

## 2020-12-02 DIAGNOSIS — D509 Iron deficiency anemia, unspecified: Secondary | ICD-10-CM | POA: Diagnosis not present

## 2020-12-09 DIAGNOSIS — Z992 Dependence on renal dialysis: Secondary | ICD-10-CM | POA: Diagnosis not present

## 2020-12-09 DIAGNOSIS — D631 Anemia in chronic kidney disease: Secondary | ICD-10-CM | POA: Diagnosis not present

## 2020-12-09 DIAGNOSIS — N2581 Secondary hyperparathyroidism of renal origin: Secondary | ICD-10-CM | POA: Diagnosis not present

## 2020-12-09 DIAGNOSIS — D509 Iron deficiency anemia, unspecified: Secondary | ICD-10-CM | POA: Diagnosis not present

## 2020-12-09 DIAGNOSIS — N186 End stage renal disease: Secondary | ICD-10-CM | POA: Diagnosis not present

## 2020-12-10 DIAGNOSIS — Z0000 Encounter for general adult medical examination without abnormal findings: Secondary | ICD-10-CM | POA: Diagnosis not present

## 2020-12-10 DIAGNOSIS — E2839 Other primary ovarian failure: Secondary | ICD-10-CM | POA: Diagnosis not present

## 2020-12-15 DIAGNOSIS — L218 Other seborrheic dermatitis: Secondary | ICD-10-CM | POA: Diagnosis not present

## 2020-12-16 DIAGNOSIS — Z992 Dependence on renal dialysis: Secondary | ICD-10-CM | POA: Diagnosis not present

## 2020-12-16 DIAGNOSIS — N186 End stage renal disease: Secondary | ICD-10-CM | POA: Diagnosis not present

## 2020-12-16 DIAGNOSIS — D631 Anemia in chronic kidney disease: Secondary | ICD-10-CM | POA: Diagnosis not present

## 2020-12-16 DIAGNOSIS — R52 Pain, unspecified: Secondary | ICD-10-CM | POA: Diagnosis not present

## 2020-12-16 DIAGNOSIS — N2581 Secondary hyperparathyroidism of renal origin: Secondary | ICD-10-CM | POA: Diagnosis not present

## 2020-12-16 DIAGNOSIS — D509 Iron deficiency anemia, unspecified: Secondary | ICD-10-CM | POA: Diagnosis not present

## 2020-12-22 DIAGNOSIS — N186 End stage renal disease: Secondary | ICD-10-CM | POA: Diagnosis not present

## 2020-12-22 DIAGNOSIS — Z992 Dependence on renal dialysis: Secondary | ICD-10-CM | POA: Diagnosis not present

## 2020-12-22 DIAGNOSIS — I129 Hypertensive chronic kidney disease with stage 1 through stage 4 chronic kidney disease, or unspecified chronic kidney disease: Secondary | ICD-10-CM | POA: Diagnosis not present

## 2020-12-23 DIAGNOSIS — D631 Anemia in chronic kidney disease: Secondary | ICD-10-CM | POA: Diagnosis not present

## 2020-12-23 DIAGNOSIS — D509 Iron deficiency anemia, unspecified: Secondary | ICD-10-CM | POA: Diagnosis not present

## 2020-12-23 DIAGNOSIS — Z992 Dependence on renal dialysis: Secondary | ICD-10-CM | POA: Diagnosis not present

## 2020-12-23 DIAGNOSIS — N2581 Secondary hyperparathyroidism of renal origin: Secondary | ICD-10-CM | POA: Diagnosis not present

## 2020-12-23 DIAGNOSIS — N186 End stage renal disease: Secondary | ICD-10-CM | POA: Diagnosis not present

## 2020-12-29 ENCOUNTER — Telehealth: Payer: Self-pay | Admitting: Internal Medicine

## 2020-12-29 MED ORDER — FREESTYLE LITE TEST VI STRP: ORAL_STRIP | 0 refills | Status: DC

## 2020-12-29 MED ORDER — FREESTYLE LITE DEVI: 0 refills | Status: DC

## 2020-12-29 NOTE — Telephone Encounter (Signed)
Pt went out of town and misplaced her CGM/supplies. Needs refills of all. Blood Glucose Monitoring Suppl (FREESTYLE LITE) DEVI, test strips, and lancets  [place]   Pt contact [phone]

## 2020-12-29 NOTE — Telephone Encounter (Signed)
Script sent  

## 2020-12-30 DIAGNOSIS — D631 Anemia in chronic kidney disease: Secondary | ICD-10-CM | POA: Diagnosis not present

## 2020-12-30 DIAGNOSIS — N2581 Secondary hyperparathyroidism of renal origin: Secondary | ICD-10-CM | POA: Diagnosis not present

## 2020-12-30 DIAGNOSIS — Z992 Dependence on renal dialysis: Secondary | ICD-10-CM | POA: Diagnosis not present

## 2020-12-30 DIAGNOSIS — N186 End stage renal disease: Secondary | ICD-10-CM | POA: Diagnosis not present

## 2020-12-30 DIAGNOSIS — D509 Iron deficiency anemia, unspecified: Secondary | ICD-10-CM | POA: Diagnosis not present

## 2021-01-06 DIAGNOSIS — Z992 Dependence on renal dialysis: Secondary | ICD-10-CM | POA: Diagnosis not present

## 2021-01-06 DIAGNOSIS — N2581 Secondary hyperparathyroidism of renal origin: Secondary | ICD-10-CM | POA: Diagnosis not present

## 2021-01-06 DIAGNOSIS — N186 End stage renal disease: Secondary | ICD-10-CM | POA: Diagnosis not present

## 2021-01-06 DIAGNOSIS — D631 Anemia in chronic kidney disease: Secondary | ICD-10-CM | POA: Diagnosis not present

## 2021-01-06 DIAGNOSIS — D509 Iron deficiency anemia, unspecified: Secondary | ICD-10-CM | POA: Diagnosis not present

## 2021-01-13 DIAGNOSIS — N2581 Secondary hyperparathyroidism of renal origin: Secondary | ICD-10-CM | POA: Diagnosis not present

## 2021-01-13 DIAGNOSIS — D631 Anemia in chronic kidney disease: Secondary | ICD-10-CM | POA: Diagnosis not present

## 2021-01-13 DIAGNOSIS — Z992 Dependence on renal dialysis: Secondary | ICD-10-CM | POA: Diagnosis not present

## 2021-01-13 DIAGNOSIS — N186 End stage renal disease: Secondary | ICD-10-CM | POA: Diagnosis not present

## 2021-01-14 ENCOUNTER — Other Ambulatory Visit: Payer: Self-pay

## 2021-01-14 ENCOUNTER — Ambulatory Visit: Payer: HMO

## 2021-01-14 DIAGNOSIS — Z1231 Encounter for screening mammogram for malignant neoplasm of breast: Secondary | ICD-10-CM | POA: Diagnosis not present

## 2021-01-18 DIAGNOSIS — Z992 Dependence on renal dialysis: Secondary | ICD-10-CM | POA: Diagnosis not present

## 2021-01-18 DIAGNOSIS — N2581 Secondary hyperparathyroidism of renal origin: Secondary | ICD-10-CM | POA: Diagnosis not present

## 2021-01-18 DIAGNOSIS — N186 End stage renal disease: Secondary | ICD-10-CM | POA: Diagnosis not present

## 2021-01-21 DIAGNOSIS — Z992 Dependence on renal dialysis: Secondary | ICD-10-CM | POA: Diagnosis not present

## 2021-01-21 DIAGNOSIS — N186 End stage renal disease: Secondary | ICD-10-CM | POA: Diagnosis not present

## 2021-01-21 DIAGNOSIS — I129 Hypertensive chronic kidney disease with stage 1 through stage 4 chronic kidney disease, or unspecified chronic kidney disease: Secondary | ICD-10-CM | POA: Diagnosis not present

## 2021-01-22 DIAGNOSIS — D631 Anemia in chronic kidney disease: Secondary | ICD-10-CM | POA: Diagnosis not present

## 2021-01-22 DIAGNOSIS — Z992 Dependence on renal dialysis: Secondary | ICD-10-CM | POA: Diagnosis not present

## 2021-01-22 DIAGNOSIS — N186 End stage renal disease: Secondary | ICD-10-CM | POA: Diagnosis not present

## 2021-01-22 DIAGNOSIS — N2581 Secondary hyperparathyroidism of renal origin: Secondary | ICD-10-CM | POA: Diagnosis not present

## 2021-01-24 DIAGNOSIS — Z992 Dependence on renal dialysis: Secondary | ICD-10-CM | POA: Diagnosis not present

## 2021-01-24 DIAGNOSIS — N186 End stage renal disease: Secondary | ICD-10-CM | POA: Diagnosis not present

## 2021-01-24 DIAGNOSIS — N2581 Secondary hyperparathyroidism of renal origin: Secondary | ICD-10-CM | POA: Diagnosis not present

## 2021-01-24 DIAGNOSIS — D631 Anemia in chronic kidney disease: Secondary | ICD-10-CM | POA: Diagnosis not present

## 2021-01-27 DIAGNOSIS — D509 Iron deficiency anemia, unspecified: Secondary | ICD-10-CM | POA: Diagnosis not present

## 2021-01-27 DIAGNOSIS — N2581 Secondary hyperparathyroidism of renal origin: Secondary | ICD-10-CM | POA: Diagnosis not present

## 2021-01-27 DIAGNOSIS — D631 Anemia in chronic kidney disease: Secondary | ICD-10-CM | POA: Diagnosis not present

## 2021-01-27 DIAGNOSIS — Z992 Dependence on renal dialysis: Secondary | ICD-10-CM | POA: Diagnosis not present

## 2021-01-27 DIAGNOSIS — N186 End stage renal disease: Secondary | ICD-10-CM | POA: Diagnosis not present

## 2021-02-02 ENCOUNTER — Other Ambulatory Visit: Payer: Self-pay | Admitting: Internal Medicine

## 2021-02-03 DIAGNOSIS — N186 End stage renal disease: Secondary | ICD-10-CM | POA: Diagnosis not present

## 2021-02-03 DIAGNOSIS — N2581 Secondary hyperparathyroidism of renal origin: Secondary | ICD-10-CM | POA: Diagnosis not present

## 2021-02-03 DIAGNOSIS — Z992 Dependence on renal dialysis: Secondary | ICD-10-CM | POA: Diagnosis not present

## 2021-02-03 DIAGNOSIS — D631 Anemia in chronic kidney disease: Secondary | ICD-10-CM | POA: Diagnosis not present

## 2021-02-04 DIAGNOSIS — G894 Chronic pain syndrome: Secondary | ICD-10-CM | POA: Diagnosis not present

## 2021-02-04 DIAGNOSIS — G8929 Other chronic pain: Secondary | ICD-10-CM | POA: Diagnosis not present

## 2021-02-04 DIAGNOSIS — M5136 Other intervertebral disc degeneration, lumbar region: Secondary | ICD-10-CM | POA: Diagnosis not present

## 2021-02-04 DIAGNOSIS — M48062 Spinal stenosis, lumbar region with neurogenic claudication: Secondary | ICD-10-CM | POA: Diagnosis not present

## 2021-02-07 DIAGNOSIS — I447 Left bundle-branch block, unspecified: Secondary | ICD-10-CM | POA: Diagnosis not present

## 2021-02-07 DIAGNOSIS — I509 Heart failure, unspecified: Secondary | ICD-10-CM | POA: Diagnosis not present

## 2021-02-07 DIAGNOSIS — I214 Non-ST elevation (NSTEMI) myocardial infarction: Secondary | ICD-10-CM | POA: Diagnosis not present

## 2021-02-07 DIAGNOSIS — Z794 Long term (current) use of insulin: Secondary | ICD-10-CM | POA: Diagnosis not present

## 2021-02-07 DIAGNOSIS — Z91138 Patient's unintentional underdosing of medication regimen for other reason: Secondary | ICD-10-CM | POA: Diagnosis not present

## 2021-02-07 DIAGNOSIS — Z992 Dependence on renal dialysis: Secondary | ICD-10-CM | POA: Diagnosis not present

## 2021-02-07 DIAGNOSIS — I4891 Unspecified atrial fibrillation: Secondary | ICD-10-CM | POA: Diagnosis not present

## 2021-02-07 DIAGNOSIS — R9431 Abnormal electrocardiogram [ECG] [EKG]: Secondary | ICD-10-CM | POA: Diagnosis not present

## 2021-02-07 DIAGNOSIS — E785 Hyperlipidemia, unspecified: Secondary | ICD-10-CM | POA: Diagnosis not present

## 2021-02-07 DIAGNOSIS — N186 End stage renal disease: Secondary | ICD-10-CM | POA: Diagnosis not present

## 2021-02-07 DIAGNOSIS — R059 Cough, unspecified: Secondary | ICD-10-CM | POA: Diagnosis not present

## 2021-02-07 DIAGNOSIS — E1122 Type 2 diabetes mellitus with diabetic chronic kidney disease: Secondary | ICD-10-CM | POA: Diagnosis not present

## 2021-02-07 DIAGNOSIS — I132 Hypertensive heart and chronic kidney disease with heart failure and with stage 5 chronic kidney disease, or end stage renal disease: Secondary | ICD-10-CM | POA: Diagnosis not present

## 2021-02-07 DIAGNOSIS — Z20822 Contact with and (suspected) exposure to covid-19: Secondary | ICD-10-CM | POA: Diagnosis not present

## 2021-02-07 DIAGNOSIS — I5030 Unspecified diastolic (congestive) heart failure: Secondary | ICD-10-CM | POA: Diagnosis not present

## 2021-02-08 DIAGNOSIS — I4891 Unspecified atrial fibrillation: Secondary | ICD-10-CM | POA: Diagnosis not present

## 2021-02-08 DIAGNOSIS — R9431 Abnormal electrocardiogram [ECG] [EKG]: Secondary | ICD-10-CM | POA: Diagnosis not present

## 2021-02-08 DIAGNOSIS — I058 Other rheumatic mitral valve diseases: Secondary | ICD-10-CM | POA: Diagnosis not present

## 2021-02-08 DIAGNOSIS — I447 Left bundle-branch block, unspecified: Secondary | ICD-10-CM | POA: Diagnosis not present

## 2021-02-09 ENCOUNTER — Ambulatory Visit: Payer: HMO | Admitting: Internal Medicine

## 2021-02-09 DIAGNOSIS — I4891 Unspecified atrial fibrillation: Secondary | ICD-10-CM | POA: Diagnosis not present

## 2021-02-10 ENCOUNTER — Other Ambulatory Visit (HOSPITAL_COMMUNITY): Payer: Self-pay | Admitting: Cardiology

## 2021-02-13 ENCOUNTER — Other Ambulatory Visit (HOSPITAL_COMMUNITY): Payer: Self-pay

## 2021-02-13 MED ORDER — METOPROLOL SUCCINATE ER 25 MG PO TB24: ORAL_TABLET | ORAL | 0 refills | Status: DC

## 2021-02-17 ENCOUNTER — Emergency Department (HOSPITAL_COMMUNITY): Payer: HMO

## 2021-02-17 ENCOUNTER — Other Ambulatory Visit: Payer: Self-pay

## 2021-02-17 ENCOUNTER — Encounter (HOSPITAL_COMMUNITY): Payer: Self-pay

## 2021-02-17 ENCOUNTER — Inpatient Hospital Stay (HOSPITAL_COMMUNITY)
Admission: EM | Admit: 2021-02-17 | Discharge: 2021-03-05 | DRG: 286 | Disposition: A | Discharge status: Skilled Nursing Facility | Payer: HMO | Source: Ambulatory Visit | Attending: Internal Medicine | Admitting: Internal Medicine

## 2021-02-17 DIAGNOSIS — I5043 Acute on chronic combined systolic (congestive) and diastolic (congestive) heart failure: Secondary | ICD-10-CM

## 2021-02-17 DIAGNOSIS — Z992 Dependence on renal dialysis: Secondary | ICD-10-CM | POA: Diagnosis not present

## 2021-02-17 DIAGNOSIS — R0902 Hypoxemia: Secondary | ICD-10-CM

## 2021-02-17 DIAGNOSIS — R578 Other shock: Secondary | ICD-10-CM | POA: Diagnosis not present

## 2021-02-17 DIAGNOSIS — E872 Acidosis, unspecified: Secondary | ICD-10-CM | POA: Diagnosis present

## 2021-02-17 DIAGNOSIS — I4891 Unspecified atrial fibrillation: Secondary | ICD-10-CM | POA: Diagnosis not present

## 2021-02-17 DIAGNOSIS — J440 Chronic obstructive pulmonary disease with acute lower respiratory infection: Secondary | ICD-10-CM | POA: Diagnosis present

## 2021-02-17 DIAGNOSIS — Z7401 Bed confinement status: Secondary | ICD-10-CM | POA: Diagnosis not present

## 2021-02-17 DIAGNOSIS — I959 Hypotension, unspecified: Secondary | ICD-10-CM

## 2021-02-17 DIAGNOSIS — N183 Chronic kidney disease, stage 3 unspecified: Secondary | ICD-10-CM | POA: Diagnosis not present

## 2021-02-17 DIAGNOSIS — E785 Hyperlipidemia, unspecified: Secondary | ICD-10-CM | POA: Diagnosis present

## 2021-02-17 DIAGNOSIS — R0602 Shortness of breath: Secondary | ICD-10-CM | POA: Diagnosis present

## 2021-02-17 DIAGNOSIS — Z978 Presence of other specified devices: Secondary | ICD-10-CM | POA: Diagnosis not present

## 2021-02-17 DIAGNOSIS — I272 Pulmonary hypertension, unspecified: Secondary | ICD-10-CM | POA: Diagnosis present

## 2021-02-17 DIAGNOSIS — M6258 Muscle wasting and atrophy, not elsewhere classified, other site: Secondary | ICD-10-CM | POA: Diagnosis not present

## 2021-02-17 DIAGNOSIS — R41841 Cognitive communication deficit: Secondary | ICD-10-CM | POA: Diagnosis not present

## 2021-02-17 DIAGNOSIS — Z7951 Long term (current) use of inhaled steroids: Secondary | ICD-10-CM

## 2021-02-17 DIAGNOSIS — Z79899 Other long term (current) drug therapy: Secondary | ICD-10-CM

## 2021-02-17 DIAGNOSIS — J9602 Acute respiratory failure with hypercapnia: Secondary | ICD-10-CM | POA: Diagnosis not present

## 2021-02-17 DIAGNOSIS — G9341 Metabolic encephalopathy: Secondary | ICD-10-CM | POA: Diagnosis present

## 2021-02-17 DIAGNOSIS — I6523 Occlusion and stenosis of bilateral carotid arteries: Secondary | ICD-10-CM | POA: Diagnosis present

## 2021-02-17 DIAGNOSIS — R1312 Dysphagia, oropharyngeal phase: Secondary | ICD-10-CM | POA: Diagnosis not present

## 2021-02-17 DIAGNOSIS — R2689 Other abnormalities of gait and mobility: Secondary | ICD-10-CM | POA: Diagnosis not present

## 2021-02-17 DIAGNOSIS — J9601 Acute respiratory failure with hypoxia: Secondary | ICD-10-CM | POA: Diagnosis present

## 2021-02-17 DIAGNOSIS — E1122 Type 2 diabetes mellitus with diabetic chronic kidney disease: Secondary | ICD-10-CM | POA: Diagnosis present

## 2021-02-17 DIAGNOSIS — I5031 Acute diastolic (congestive) heart failure: Secondary | ICD-10-CM | POA: Diagnosis not present

## 2021-02-17 DIAGNOSIS — D631 Anemia in chronic kidney disease: Secondary | ICD-10-CM | POA: Diagnosis present

## 2021-02-17 DIAGNOSIS — I480 Paroxysmal atrial fibrillation: Secondary | ICD-10-CM | POA: Diagnosis present

## 2021-02-17 DIAGNOSIS — Z91012 Allergy to eggs: Secondary | ICD-10-CM

## 2021-02-17 DIAGNOSIS — Z794 Long term (current) use of insulin: Secondary | ICD-10-CM | POA: Diagnosis not present

## 2021-02-17 DIAGNOSIS — Z87891 Personal history of nicotine dependence: Secondary | ICD-10-CM

## 2021-02-17 DIAGNOSIS — I429 Cardiomyopathy, unspecified: Secondary | ICD-10-CM | POA: Diagnosis not present

## 2021-02-17 DIAGNOSIS — Z452 Encounter for adjustment and management of vascular access device: Secondary | ICD-10-CM

## 2021-02-17 DIAGNOSIS — M6281 Muscle weakness (generalized): Secondary | ICD-10-CM | POA: Diagnosis not present

## 2021-02-17 DIAGNOSIS — Z9049 Acquired absence of other specified parts of digestive tract: Secondary | ICD-10-CM

## 2021-02-17 DIAGNOSIS — J90 Pleural effusion, not elsewhere classified: Secondary | ICD-10-CM | POA: Diagnosis not present

## 2021-02-17 DIAGNOSIS — I517 Cardiomegaly: Secondary | ICD-10-CM | POA: Diagnosis not present

## 2021-02-17 DIAGNOSIS — E669 Obesity, unspecified: Secondary | ICD-10-CM | POA: Diagnosis present

## 2021-02-17 DIAGNOSIS — Z7901 Long term (current) use of anticoagulants: Secondary | ICD-10-CM

## 2021-02-17 DIAGNOSIS — Z6835 Body mass index (BMI) 35.0-35.9, adult: Secondary | ICD-10-CM

## 2021-02-17 DIAGNOSIS — G319 Degenerative disease of nervous system, unspecified: Secondary | ICD-10-CM | POA: Diagnosis not present

## 2021-02-17 DIAGNOSIS — Z09 Encounter for follow-up examination after completed treatment for conditions other than malignant neoplasm: Secondary | ICD-10-CM

## 2021-02-17 DIAGNOSIS — E889 Metabolic disorder, unspecified: Secondary | ICD-10-CM | POA: Diagnosis present

## 2021-02-17 DIAGNOSIS — J690 Pneumonitis due to inhalation of food and vomit: Secondary | ICD-10-CM | POA: Diagnosis not present

## 2021-02-17 DIAGNOSIS — I5033 Acute on chronic diastolic (congestive) heart failure: Secondary | ICD-10-CM | POA: Insufficient documentation

## 2021-02-17 DIAGNOSIS — I447 Left bundle-branch block, unspecified: Secondary | ICD-10-CM | POA: Diagnosis present

## 2021-02-17 DIAGNOSIS — I499 Cardiac arrhythmia, unspecified: Secondary | ICD-10-CM | POA: Diagnosis not present

## 2021-02-17 DIAGNOSIS — J811 Chronic pulmonary edema: Secondary | ICD-10-CM | POA: Diagnosis not present

## 2021-02-17 DIAGNOSIS — I132 Hypertensive heart and chronic kidney disease with heart failure and with stage 5 chronic kidney disease, or end stage renal disease: Secondary | ICD-10-CM | POA: Diagnosis present

## 2021-02-17 DIAGNOSIS — N186 End stage renal disease: Secondary | ICD-10-CM

## 2021-02-17 DIAGNOSIS — E440 Moderate protein-calorie malnutrition: Secondary | ICD-10-CM | POA: Diagnosis present

## 2021-02-17 DIAGNOSIS — E119 Type 2 diabetes mellitus without complications: Secondary | ICD-10-CM | POA: Diagnosis not present

## 2021-02-17 DIAGNOSIS — F05 Delirium due to known physiological condition: Secondary | ICD-10-CM | POA: Diagnosis not present

## 2021-02-17 DIAGNOSIS — Z888 Allergy status to other drugs, medicaments and biological substances status: Secondary | ICD-10-CM

## 2021-02-17 DIAGNOSIS — R1310 Dysphagia, unspecified: Secondary | ICD-10-CM | POA: Diagnosis present

## 2021-02-17 DIAGNOSIS — J449 Chronic obstructive pulmonary disease, unspecified: Secondary | ICD-10-CM | POA: Diagnosis not present

## 2021-02-17 DIAGNOSIS — G9340 Encephalopathy, unspecified: Secondary | ICD-10-CM | POA: Diagnosis not present

## 2021-02-17 DIAGNOSIS — I110 Hypertensive heart disease with heart failure: Secondary | ICD-10-CM | POA: Diagnosis not present

## 2021-02-17 DIAGNOSIS — I120 Hypertensive chronic kidney disease with stage 5 chronic kidney disease or end stage renal disease: Secondary | ICD-10-CM | POA: Diagnosis not present

## 2021-02-17 DIAGNOSIS — M109 Gout, unspecified: Secondary | ICD-10-CM | POA: Diagnosis present

## 2021-02-17 DIAGNOSIS — E1165 Type 2 diabetes mellitus with hyperglycemia: Secondary | ICD-10-CM | POA: Diagnosis present

## 2021-02-17 DIAGNOSIS — E8770 Fluid overload, unspecified: Secondary | ICD-10-CM

## 2021-02-17 DIAGNOSIS — I2510 Atherosclerotic heart disease of native coronary artery without angina pectoris: Secondary | ICD-10-CM | POA: Diagnosis present

## 2021-02-17 DIAGNOSIS — I6502 Occlusion and stenosis of left vertebral artery: Secondary | ICD-10-CM | POA: Diagnosis not present

## 2021-02-17 DIAGNOSIS — I509 Heart failure, unspecified: Secondary | ICD-10-CM | POA: Diagnosis not present

## 2021-02-17 DIAGNOSIS — G4733 Obstructive sleep apnea (adult) (pediatric): Secondary | ICD-10-CM | POA: Diagnosis present

## 2021-02-17 DIAGNOSIS — M1990 Unspecified osteoarthritis, unspecified site: Secondary | ICD-10-CM | POA: Diagnosis present

## 2021-02-17 DIAGNOSIS — E871 Hypo-osmolality and hyponatremia: Secondary | ICD-10-CM | POA: Diagnosis present

## 2021-02-17 DIAGNOSIS — Z87442 Personal history of urinary calculi: Secondary | ICD-10-CM

## 2021-02-17 DIAGNOSIS — Z20822 Contact with and (suspected) exposure to covid-19: Secondary | ICD-10-CM | POA: Diagnosis present

## 2021-02-17 DIAGNOSIS — Z880 Allergy status to penicillin: Secondary | ICD-10-CM

## 2021-02-17 DIAGNOSIS — N2581 Secondary hyperparathyroidism of renal origin: Secondary | ICD-10-CM | POA: Diagnosis present

## 2021-02-17 DIAGNOSIS — I248 Other forms of acute ischemic heart disease: Secondary | ICD-10-CM | POA: Diagnosis present

## 2021-02-17 DIAGNOSIS — I471 Supraventricular tachycardia: Secondary | ICD-10-CM | POA: Diagnosis present

## 2021-02-17 DIAGNOSIS — E1129 Type 2 diabetes mellitus with other diabetic kidney complication: Secondary | ICD-10-CM | POA: Diagnosis present

## 2021-02-17 DIAGNOSIS — J42 Unspecified chronic bronchitis: Secondary | ICD-10-CM | POA: Diagnosis not present

## 2021-02-17 DIAGNOSIS — R4182 Altered mental status, unspecified: Secondary | ICD-10-CM | POA: Diagnosis not present

## 2021-02-17 DIAGNOSIS — E1121 Type 2 diabetes mellitus with diabetic nephropathy: Secondary | ICD-10-CM | POA: Diagnosis not present

## 2021-02-17 DIAGNOSIS — L409 Psoriasis, unspecified: Secondary | ICD-10-CM | POA: Diagnosis present

## 2021-02-17 DIAGNOSIS — R000 Tachycardia, unspecified: Secondary | ICD-10-CM | POA: Diagnosis not present

## 2021-02-17 DIAGNOSIS — R531 Weakness: Secondary | ICD-10-CM | POA: Diagnosis not present

## 2021-02-17 DIAGNOSIS — I5021 Acute systolic (congestive) heart failure: Secondary | ICD-10-CM | POA: Diagnosis not present

## 2021-02-17 DIAGNOSIS — R262 Difficulty in walking, not elsewhere classified: Secondary | ICD-10-CM | POA: Diagnosis not present

## 2021-02-17 LAB — RESP PANEL BY RT-PCR (FLU A&B, COVID) ARPGX2
Influenza A by PCR: NEGATIVE
Influenza B by PCR: NEGATIVE
SARS Coronavirus 2 by RT PCR: NEGATIVE

## 2021-02-17 LAB — BASIC METABOLIC PANEL
Anion gap: 14 (ref 5–15)
BUN: 22 mg/dL (ref 8–23)
CO2: 32 mmol/L (ref 22–32)
Calcium: 8.1 mg/dL — ABNORMAL LOW (ref 8.9–10.3)
Chloride: 95 mmol/L — ABNORMAL LOW (ref 98–111)
Creatinine, Ser: 4.42 mg/dL — ABNORMAL HIGH (ref 0.44–1.00)
GFR, Estimated: 10 mL/min — ABNORMAL LOW (ref >60)
Glucose, Bld: 205 mg/dL — ABNORMAL HIGH (ref 70–99)
Potassium: 3.3 mmol/L — ABNORMAL LOW (ref 3.5–5.1)
Sodium: 141 mmol/L (ref 135–145)

## 2021-02-17 LAB — CBC
HCT: 37.2 % (ref 36.0–46.0)
Hemoglobin: 11.9 g/dL — ABNORMAL LOW (ref 12.0–15.0)
MCH: 31 pg (ref 26.0–34.0)
MCHC: 32 g/dL (ref 30.0–36.0)
MCV: 96.9 fL (ref 80.0–100.0)
Platelets: 300 10*3/uL (ref 150–400)
RBC: 3.84 MIL/uL — ABNORMAL LOW (ref 3.87–5.11)
RDW: 13.5 % (ref 11.5–15.5)
WBC: 13.7 10*3/uL — ABNORMAL HIGH (ref 4.0–10.5)
nRBC: 0 % (ref 0.0–0.2)

## 2021-02-17 LAB — TROPONIN I (HIGH SENSITIVITY)
Troponin I (High Sensitivity): 64 ng/L — ABNORMAL HIGH (ref <18)
Troponin I (High Sensitivity): 67 ng/L — ABNORMAL HIGH (ref <18)

## 2021-02-17 LAB — CBG MONITORING, ED: Glucose-Capillary: 175 mg/dL — ABNORMAL HIGH (ref 70–99)

## 2021-02-17 MED ORDER — ONDANSETRON HCL 4 MG/2ML IJ SOLN: 4.0000 mg | Freq: Four times a day (QID) | INTRAMUSCULAR | Status: DC | PRN

## 2021-02-17 MED ORDER — ACETAMINOPHEN 650 MG RE SUPP: 650.0000 mg | Freq: Four times a day (QID) | RECTAL | Status: DC | PRN

## 2021-02-17 MED ORDER — PANTOPRAZOLE SODIUM 40 MG PO TBEC
40.0000 mg | DELAYED_RELEASE_TABLET | Freq: Every evening | ORAL | Status: DC
  Administered 2021-02-17 – 2021-02-18 (×2): 40 mg via ORAL
  Filled 2021-02-17 (×2): qty 1

## 2021-02-17 MED ORDER — FENTANYL CITRATE PF 50 MCG/ML IJ SOSY
25.0000 ug | PREFILLED_SYRINGE | Freq: Once | INTRAMUSCULAR | Status: AC
  Administered 2021-02-17: 17:00:00 25 ug via INTRAVENOUS
  Filled 2021-02-17: qty 1

## 2021-02-17 MED ORDER — DILTIAZEM HCL-DEXTROSE 125-5 MG/125ML-% IV SOLN (PREMIX)
5.0000 mg/h | INTRAVENOUS | Status: DC
  Administered 2021-02-17: 16:00:00 5 mg/h via INTRAVENOUS
  Filled 2021-02-17: qty 125

## 2021-02-17 MED ORDER — ETOMIDATE 2 MG/ML IV SOLN
8.0000 mg | Freq: Once | INTRAVENOUS | Status: AC
  Administered 2021-02-17: 17:00:00 8 mg via INTRAVENOUS
  Filled 2021-02-17: qty 10

## 2021-02-17 MED ORDER — ALBUTEROL SULFATE (2.5 MG/3ML) 0.083% IN NEBU: 3.0000 mL | INHALATION_SOLUTION | RESPIRATORY_TRACT | Status: DC | PRN

## 2021-02-17 MED ORDER — INSULIN ASPART 100 UNIT/ML IJ SOLN
0.0000 [IU] | Freq: Three times a day (TID) | INTRAMUSCULAR | Status: DC
  Administered 2021-02-19: 07:00:00 2 [IU] via SUBCUTANEOUS

## 2021-02-17 MED ORDER — METOPROLOL SUCCINATE ER 25 MG PO TB24: 25.0000 mg | ORAL_TABLET | ORAL | Status: DC

## 2021-02-17 MED ORDER — MOMETASONE FURO-FORMOTEROL FUM 200-5 MCG/ACT IN AERO
2.0000 | INHALATION_SPRAY | Freq: Two times a day (BID) | RESPIRATORY_TRACT | Status: DC
  Administered 2021-02-17 – 2021-02-18 (×2): 2 via RESPIRATORY_TRACT
  Filled 2021-02-17: qty 8.8

## 2021-02-17 MED ORDER — ACETAMINOPHEN 325 MG PO TABS: 650.0000 mg | ORAL_TABLET | Freq: Four times a day (QID) | ORAL | Status: DC | PRN

## 2021-02-17 MED ORDER — TRAMADOL HCL 50 MG PO TABS
50.0000 mg | ORAL_TABLET | Freq: Once | ORAL | Status: AC
  Administered 2021-02-17: 16:00:00 50 mg via ORAL
  Filled 2021-02-17: qty 1

## 2021-02-17 MED ORDER — DILTIAZEM LOAD VIA INFUSION
10.0000 mg | Freq: Once | INTRAVENOUS | Status: AC
  Administered 2021-02-17: 16:00:00 10 mg via INTRAVENOUS
  Filled 2021-02-17: qty 10

## 2021-02-17 MED ORDER — CHLORHEXIDINE GLUCONATE CLOTH 2 % EX PADS
6.0000 | MEDICATED_PAD | Freq: Every day | CUTANEOUS | Status: DC
  Administered 2021-02-19 – 2021-02-26 (×8): 6 via TOPICAL

## 2021-02-17 MED ORDER — ONDANSETRON HCL 4 MG/2ML IJ SOLN
4.0000 mg | Freq: Once | INTRAMUSCULAR | Status: AC
  Administered 2021-02-17: 18:00:00 4 mg via INTRAVENOUS
  Filled 2021-02-17: qty 2

## 2021-02-17 MED ORDER — METOPROLOL TARTRATE 25 MG PO TABS
25.0000 mg | ORAL_TABLET | Freq: Four times a day (QID) | ORAL | Status: DC
  Administered 2021-02-17: 22:00:00 25 mg via ORAL
  Filled 2021-02-17: qty 1

## 2021-02-17 MED ORDER — SODIUM CHLORIDE 0.9% FLUSH
3.0000 mL | Freq: Two times a day (BID) | INTRAVENOUS | Status: DC
  Administered 2021-02-18 – 2021-02-25 (×14): 3 mL via INTRAVENOUS

## 2021-02-17 MED ORDER — INSULIN ASPART 100 UNIT/ML IJ SOLN
0.0000 [IU] | Freq: Every day | INTRAMUSCULAR | Status: DC
  Administered 2021-02-18: 23:00:00 2 [IU] via SUBCUTANEOUS

## 2021-02-17 MED ORDER — LATANOPROST 0.005 % OP SOLN
1.0000 [drp] | Freq: Every day | OPHTHALMIC | Status: DC
  Administered 2021-02-17 – 2021-03-04 (×14): 1 [drp] via OPHTHALMIC
  Filled 2021-02-17 (×3): qty 2.5

## 2021-02-17 MED ORDER — ONDANSETRON HCL 4 MG PO TABS: 4.0000 mg | ORAL_TABLET | Freq: Four times a day (QID) | ORAL | Status: DC | PRN

## 2021-02-17 MED ORDER — SENNOSIDES-DOCUSATE SODIUM 8.6-50 MG PO TABS: 1.0000 | ORAL_TABLET | Freq: Every evening | ORAL | Status: DC | PRN

## 2021-02-17 MED ORDER — ATORVASTATIN CALCIUM 80 MG PO TABS
80.0000 mg | ORAL_TABLET | Freq: Every evening | ORAL | Status: DC
  Administered 2021-02-17 – 2021-02-18 (×2): 80 mg via ORAL
  Filled 2021-02-17 (×2): qty 1

## 2021-02-17 MED ORDER — APIXABAN 5 MG PO TABS
5.0000 mg | ORAL_TABLET | Freq: Two times a day (BID) | ORAL | Status: DC
  Administered 2021-02-17 – 2021-02-18 (×2): 5 mg via ORAL
  Filled 2021-02-17 (×3): qty 1

## 2021-02-17 MED ORDER — MIRTAZAPINE 15 MG PO TABS
15.0000 mg | ORAL_TABLET | Freq: Every day | ORAL | Status: DC
  Administered 2021-02-17 – 2021-02-18 (×2): 15 mg via ORAL
  Filled 2021-02-17 (×3): qty 1

## 2021-02-17 MED ORDER — FENTANYL CITRATE PF 50 MCG/ML IJ SOSY: 50.0000 ug | PREFILLED_SYRINGE | Freq: Once | INTRAMUSCULAR | Status: DC

## 2021-02-17 NOTE — Consult Note (Signed)
Cardiology Consultation:   Patient ID: Stephanie Woodward MRN: [id number]; DOB: December 25, 1940  Admit date: 02/17/2021 Date of Consult: 02/17/2021  PCP:  Larey Dresser, MD   [facility] Providers Cardiologist:  Marigene Ehlers   Patient Profile:   Stephanie Woodward is a 80 y.o. female with a hx of chronic diastolic HF, PAF, COPD, ESRD who is being seen 02/17/2021 for the evaluation of tachycardia  at the request of Dr Patrecia Pour.  History of Present Illness:   Stephanie Woodward 80 yo female history of chronic diastolic HF,  pulmonary venous HTN, COPD, obesity, HTN, ESRD, PAF admitted from HD with SOB and tachycardia. Found to be in afib with RVR. Since known history and on anticoagulation was cardioverted in the ER with initial success however reverted back to afib. Family reports similar admision 1 week ago at [facility], where she presented with a fib with RVR. She converted prior to planned cardioversion there.    K 3.3 Cr 4.42 BUN 22 WBC 13.7 Hgb 11.9 Plt 300 Trop 64-->67 CXR cardiomegaly, mild edema EKG afib RVR, IVCD  Jan 2021 echo: LVEF 55-60%, no WMAs, grade II dd, nomral RV  05/2019 RHC/LHC 1. Normal filling pressures.  2. Mild pulmonary hypertension.  3. Preserved cardiac output.  4. Unusual coronary tree but no obstructive disease noted.  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      Past Medical History:  Diagnosis Date   Arthritis    Asthma    Atrial fibrillation (HCC)    CHF (congestive heart failure) ([name])    CKD (chronic kidney disease), stage III (HCC)    COPD (chronic obstructive pulmonary disease) (HCC)    Diabetes mellitus    INSULIN DEPENDENT   Gout    Heart murmur    no issues per pt   History of kidney stones    Hyperlipemia    Hypertension    Psoriasis    Renal disorder    congenital   Single kidney    Sleep apnea    doesn't use the Cpap    Past Surgical History:  Procedure Laterality Date    A/V FISTULAGRAM Left 10/31/2020   Procedure: A/V FISTULAGRAM;  Surgeon: Angelia Mould, MD;  Location: [place] CV [facility];  Service: Cardiovascular;  Laterality: Left;   ABDOMINAL HYSTERECTOMY     AV FISTULA PLACEMENT Left 11/17/2018   Procedure: BRACHIO-CEPHALIC ARTERIOVENOUS (AV) FISTULA CREATION IN LEFT ARM;  Surgeon: Marty Heck, MD;  Location: [place];  Service: Vascular;  Laterality: Left;   CHOLECYSTECTOMY     INSERTION OF DIALYSIS CATHETER Right 01/26/2019   Procedure: INSERTION OF DIALYSIS CATHETER, right internal jugular;  Surgeon: Angelia Mould, MD;  Location: [place];  Service: Vascular;  Laterality: Right;   LEFT AND RIGHT HEART CATHETERIZATION WITH CORONARY ANGIOGRAM N/A 11/29/2013   Procedure: LEFT AND RIGHT HEART CATHETERIZATION WITH CORONARY ANGIOGRAM;  Surgeon: Jacolyn Reedy, MD;  Location: [facility] C[facility] LAB;  Service: Cardiovascular;  Laterality: N/A;   PERIPHERAL VASCULAR INTERVENTION Left 10/31/2020   Procedure: PERIPHERAL VASCULAR INTERVENTION;  Surgeon: Angelia Mould, MD;  Location: [place] [facility];  Service: Cardiovascular;  Laterality: Left;   RIGHT HEART CATH N/A 11/23/2017   Procedure: RIGHT HEART CATH;  Surgeon: Larey Dresser, MD;  Location: [place] [facility];  Service: Cardiovascular;  Laterality: N/A;   RIGHT/LEFT HEART CATH AND CORONARY ANGIOGRAPHY N/A 06/15/2019   Procedure: RIGHT/LEFT HEART CATH AND CORONARY  ANGIOGRAPHY;  Surgeon: Larey Dresser, MD;  Location: [place] [facility];  Service: Cardiovascular;  Laterality: N/A;     Inpatient Medications: Scheduled Meds:  apixaban  5 mg Oral BID   atorvastatin  80 mg Oral QPM   [START ON 02/18/2021] Chlorhexidine Gluconate Cloth  6 each Topical Q0600   insulin aspart  0-5 Units Subcutaneous QHS   [START ON 02/18/2021] insulin aspart  0-6 Units Subcutaneous TID WC   latanoprost  1 drop Both Eyes QHS   metoprolol tartrate  25 mg Oral Q6H   mirtazapine  15 mg Oral QHS    mometasone-formoterol  2 puff Inhalation BID   pantoprazole  40 mg Oral QPM   sodium chloride flush  3 mL Intravenous Q12H   Continuous Infusions:   PRN Meds:   Allergies:    Allergies  Allergen Reactions   Eggs Or Egg-Derived Products Nausea And Vomiting   Lisinopril Nausea And Vomiting   Penicillins Nausea And Vomiting    Has patient had a PCN reaction causing immediate rash, facial/tongue/throat swelling, SOB or lightheadedness with hypotension: No Has patient had a PCN reaction causing severe rash involving mucus membranes or skin necrosis: No Has patient had a PCN reaction that required hospitalization: No Has patient had a PCN reaction occurring within the last 10 years: No If all of the above answers are "NO", then may proceed with Cephalosporin use.    Other Other (See Comments)    Social History:   Social History   Socioeconomic History   Marital status: Widowed    Spouse name: Not on file   Number of children: 2   Years of education: Not on file   Highest education level: Not on file  Occupational History   Occupation: retired  Tobacco Use   Smoking status: Former    Packs/day: 1.00    Years: 20.00    Pack years: 20.00    Types: Cigarettes    Quit date: 02/22/2009    Years since quitting: 11.9   Smokeless tobacco: Never  Vaping Use   Vaping Use: Never used  Substance and Sexual Activity   Alcohol use: Yes    Alcohol/week: 0.0 standard drinks    Comment: occasional beer   Drug use: Not Currently    Comment: marijuana in the past   Sexual activity: Never  Other Topics Concern   Not on file  Social History Narrative   Not on file   Social Determinants of Health   Financial Resource Strain: Not on file  Food Insecurity: Not on file  Transportation Needs: Not on file  Physical Activity: Not on file  Stress: Not on file  Social Connections: Not on file  Intimate Partner Violence: Not on file    Family History:    Family History  Problem  Relation Age of Onset   Lupus Daughter    Cancer Mother 57       type unknown   CVA Father 86   Cancer Brother 50       type unknown     ROS:  Please see the history of present illness.   All other ROS reviewed and negative.     Physical Exam/Data:   Vitals:   02/17/21 1730 02/17/21 1739 02/17/21 1800 02/17/21 1830  BP: 110/69 130/63 (!) 122/51 (!) 115/51  Pulse: 84 92 73 75  Resp: 20 20 18 14   Temp:      TempSrc:      SpO2: 99% 91% 99%  100%  Weight:      Height:        Intake/Output Summary (Last 24 hours) at 02/17/2021 2056 Last data filed at 02/17/2021 1744 Gross per 24 hour  Intake 15.4 ml  Output --  Net 15.4 ml   Last 3 Weights 02/17/2021 10/31/2020 10/06/2020  Weight (lbs) 197 lb 15.6 oz 198 lb 198 lb  Weight (kg) 89.8 kg 89.812 kg 89.812 kg     Body mass index is 36.21 kg/m.  General:  Well nourished, well developed, in no acute distress HEENT: normal Neck: no JVD Vascular: No carotid bruits; Distal pulses 2+ bilaterally Cardiac:  normal S1, S2; RRR; no murmur  Lungs:  clear to auscultation bilaterally, no wheezing, rhonchi or rales  Abd: soft, nontender, no hepatomegaly  Ext: no edema Musculoskeletal:  No deformities, BUE and BLE strength normal and equal Skin: warm and dry  Neuro:  CNs 2-12 intact, no focal abnormalities noted Psych:  Normal affect   EKG:  The EKG was personally reviewed and demonstrates:   Telemetry:  Telemetry was personally reviewed and demonstrates:     Laboratory Data:  High Sensitivity Troponin:   Recent Labs  Lab 02/17/21 1420 02/17/21 1640  TROPONINIHS 64* 67*     Chemistry Recent Labs  Lab 02/17/21 1420  NA 141  K 3.3*  CL 95*  CO2 32  GLUCOSE 205*  BUN 22  CREATININE 4.42*  CALCIUM 8.1*  GFRNONAA 10*  ANIONGAP 14    No results for input(s): PROT, ALBUMIN, AST, ALT, ALKPHOS, BILITOT in the last 168 hours. Lipids No results for input(s): CHOL, TRIG, HDL, LABVLDL, LDLCALC, CHOLHDL in the last 168 hours.   Hematology Recent Labs  Lab 02/17/21 1420  WBC 13.7*  RBC 3.84*  HGB 11.9*  HCT 37.2  MCV 96.9  MCH 31.0  MCHC 32.0  RDW 13.5  PLT 300   Thyroid No results for input(s): TSH, FREET4 in the last 168 hours.  BNPNo results for input(s): BNP, PROBNP in the last 168 hours.  DDimer No results for input(s): DDIMER in the last 168 hours.   Radiology/Studies:  DG Chest Portable 1 View  Result Date: 02/17/2021 CLINICAL DATA:  Shortness of breath, AFib EXAM: PORTABLE CHEST 1 VIEW COMPARISON:  Chest radiograph 06/08/2020 FINDINGS: The heart is enlarged, unchanged. The upper mediastinal contours are within normal limits. There is vascular congestion and probable mild pulmonary interstitial edema. There are trace bilateral pleural effusions. There is no pneumothorax There is no acute osseous abnormality. IMPRESSION: Cardiomegaly with mild pulmonary interstitial edema and trace bilateral pleural effusions suggesting CHF. Electronically Signed   By: Valetta Mole M.D.   On: 02/17/2021 15:05     Assessment and Plan:   1.PAF - history of PAF, presented with afib with RVR. Family reports similar presentation to [facility] last week. Plans were for DCCV but patient self converted prior - patient cardioverted in ER to SR. Subsequently has had SR with PACs, short runs of SVT, and some runs of afib. Currently SR with PACs.  - will see if can control with av nodal agents alone, start lopressor 25mg  every 6 hours (taking toprol 25 to 50mg  at home). If fails would need to consider amiodarone, antiarrhythmics limited due to diastolic HF and ESRD. Of note prior PFTs showed just restriction and 2019 HRCT showed no clear ILD so from lung standpoint amio would be reasonable.  - continue eliquis for stroke prevention  2. Chronic diastolic HF - fluid management per HD   :  [id number]}        CHA2DS2-VASc Score = 6  :1} This indicates a 9.7% annual risk of stroke. The patient's score is based upon: CHF  History: 1 HTN History: 1 Diabetes History: 1 Stroke History: 0 Vascular Disease History: 0 Age Score: 2 Gender Score: 1        For questions or updates, please contact [place] Please consult [email or web address] for contact info under    Signed, Carlyle Dolly, MD  02/17/2021 8:56 PM

## 2021-02-17 NOTE — ED Notes (Signed)
Tech came to this RN in another room and states that the pt was coughing and "choking" on water. RN into room to assess. Pt is coughing up spit and speaking in full sentences. O2 at 99%. Tech helped pt suction mouth out. Lung sounds bilaterally clear. Pt states that her "water went down the wrong pipe." Pt sitting all the way up with emesis bag. Pt states that she is okay now. Coughing has ceased. Family members at bedside. Will continue to monitor pt respiratory status.

## 2021-02-17 NOTE — H&P (Signed)
History and Physical    Stephanie Woodward:[id number] DOB: Nov 25, 1940 DOA: 02/17/2021  PCP: Larey Dresser, MD  Patient coming from: Hemodialysis center  I have personally briefly reviewed patient's old medical records in [facility]  Chief Complaint: Shortness of breath, palpitations  HPI: Stephanie Woodward is a 80 y.o. female with medical history significant for ESRD on TTS HD, paroxysmal atrial fibrillation on Eliquis, chronic diastolic CHF, LBBB, insulin-dependent type 2 diabetes, COPD, hyperlipidemia who presented to the ED for evaluation of shortness of breath.  Patient states she was in her usual state of health until the morning of 12/27 when she developed left lower chest throbbing sensation/palpitations.  She went to her usual dialysis session and after about 1-1/2-2 hours her palpitations returned and that she became short of breath.  She reports a chronic cough which is unchanged from baseline.  She was noted to be in A. fib with RVR and sent to the ED for further evaluation.  Patient feels much better after cardioversion in the ED.  She states that she only makes a small amount of urine occasionally without dysuria.  She denies any nausea, vomiting, abdominal pain, diarrhea, or obvious bleeding.  ED Course:  Initial vitals showed BP 91/64, pulse 113, RR 22, temp 99.0 F, SPO2 98% on room air.  Patient was noted to be in A. fib with RVR, rate up to 143.  Labs show WBC 13.7, hemoglobin 11.9, platelets 300,000, sodium 141, potassium 3.3, bicarb 32, BUN 22, creatinine 4.42, serum glucose 205, troponin 64 > 67.  Respiratory panel ordered and pending collection.  Portable chest x-ray shows cardiomegaly with pulmonary interstitial edema and trace bilateral pleural effusions.  Patient was given IV diltiazem 10 mg followed by continuous infusion.  Cardiology were consulted and recommended cardioversion which was performed in the ED.  Post cardioversion EKG showed sinus  arrhythmia.  Diltiazem was discontinued.  Nephrology was consulted who recommended off schedule dialysis in the morning.  The hospitalist service was consulted to admit for further evaluation and management.  Review of Systems: All systems reviewed and are negative except as documented in history of present illness above.   Past Medical History:  Diagnosis Date   Arthritis    Asthma    Atrial fibrillation (HCC)    CHF (congestive heart failure) ([place])    CKD (chronic kidney disease), stage III (HCC)    COPD (chronic obstructive pulmonary disease) (HCC)    Diabetes mellitus    INSULIN DEPENDENT   Gout    Heart murmur    no issues per pt   History of kidney stones    Hyperlipemia    Hypertension    Psoriasis    Renal disorder    congenital   Single kidney    Sleep apnea    doesn't use the Cpap    Past Surgical History:  Procedure Laterality Date   A/V FISTULAGRAM Left 10/31/2020   Procedure: A/V FISTULAGRAM;  Surgeon: Angelia Mould, MD;  Location: [place] [facility];  Service: Cardiovascular;  Laterality: Left;   ABDOMINAL HYSTERECTOMY     AV FISTULA PLACEMENT Left 11/17/2018   Procedure: BRACHIO-CEPHALIC ARTERIOVENOUS (AV) FISTULA CREATION IN LEFT ARM;  Surgeon: Marty Heck, MD;  Location: [place];  Service: Vascular;  Laterality: Left;   CHOLECYSTECTOMY     INSERTION OF DIALYSIS CATHETER Right 01/26/2019   Procedure: INSERTION OF DIALYSIS CATHETER, right internal jugular;  Surgeon: Angelia Mould, MD;  Location: [place];  Service: Vascular;  Laterality: Right;   LEFT AND RIGHT HEART CATHETERIZATION WITH CORONARY ANGIOGRAM N/A 11/29/2013   Procedure: LEFT AND RIGHT HEART CATHETERIZATION WITH CORONARY ANGIOGRAM;  Surgeon: Jacolyn Reedy, MD;  Location: [facility] CATH [facility];  Service: Cardiovascular;  Laterality: N/A;   PERIPHERAL VASCULAR INTERVENTION Left 10/31/2020   Procedure: PERIPHERAL VASCULAR INTERVENTION;  Surgeon: Angelia Mould, MD;  Location: [place] [facility];  Service: Cardiovascular;  Laterality: Left;   RIGHT HEART CATH N/A 11/23/2017   Procedure: RIGHT HEART CATH;  Surgeon: Larey Dresser, MD;  Location: [place] [facility];  Service: Cardiovascular;  Laterality: N/A;   RIGHT/LEFT HEART CATH AND CORONARY ANGIOGRAPHY N/A 06/15/2019   Procedure: RIGHT/LEFT HEART CATH AND CORONARY ANGIOGRAPHY;  Surgeon: Larey Dresser, MD;  Location: [place] [facility];  Service: Cardiovascular;  Laterality: N/A;    Social History:  reports that she quit smoking about 11 years ago. Her smoking use included cigarettes. She has a 20.00 pack-year smoking history. She has never used smokeless tobacco. She reports current alcohol use. She reports that she does not currently use drugs.  Allergies  Allergen Reactions   Eggs Or Egg-Derived Products Nausea And Vomiting   Lisinopril Nausea And Vomiting   Penicillins Nausea And Vomiting    Has patient had a PCN reaction causing immediate rash, facial/tongue/throat swelling, SOB or lightheadedness with hypotension: No Has patient had a PCN reaction causing severe rash involving mucus membranes or skin necrosis: No Has patient had a PCN reaction that required hospitalization: No Has patient had a PCN reaction occurring within the last 10 years: No If all of the above answers are "NO", then may proceed with Cephalosporin use.    Other Other (See Comments)    Family History  Problem Relation Age of Onset   Lupus Daughter    Cancer Mother 62       type unknown   CVA Father 45   Cancer Brother 76       type unknown     Prior to Admission medications   Medication Sig Start Date End Date Taking? Authorizing Provider  acetaminophen (TYLENOL) 500 MG tablet Take 500 mg by mouth every 6 (six) hours as needed (pain).   Yes [provider]  albuterol (PROVENTIL HFA;VENTOLIN HFA) 108 (90 BASE) MCG/ACT inhaler Inhale 2 puffs into the lungs every 4 (four) hours as needed for shortness of breath.   Yes  [provider]  atorvastatin (LIPITOR) 80 MG tablet Take 80 mg by mouth every evening.    Yes [provider]  B Complex-C-Zn-Folic Acid (DIALYVITE 443-XVQM 15) 0.8 MG TABS Take 1 tablet by mouth in the morning. 03/07/19  Yes [provider]  Blood Glucose Monitoring Suppl (FREESTYLE LITE) DEVI Use as instructed to check blood sugar 4 times daily Patient taking differently: 1 each by Other route See admin instructions. Use as instructed to check blood sugar 4 times daily 12/29/20  Yes Shamleffer, Melanie Crazier, MD  budesonide-formoterol (SYMBICORT) 160-4.5 MCG/ACT inhaler Inhale 2 puffs into the lungs 2 (two) times daily. 04/09/20  Yes Byrum, Rose Fillers, MD  Continuous Blood Gluc Receiver (FREESTYLE LIBRE READER) DEVI 1 kit by Does not apply route daily. Use as instructed E11.65 09/12/19  Yes Shamleffer, Melanie Crazier, MD  ELIQUIS 5 MG TABS tablet Take 1 tablet by mouth twice daily Patient taking differently: Take 5 mg by mouth 2 (two) times daily. 07/23/20  Yes Larey Dresser, MD  glucose blood (FREESTYLE LITE) test strip USE AS  DIRECTED 4 TIMES DAILY Patient taking differently: 1 each by Other route See admin instructions. USE AS DIRECTED 4 TIMES DAILY 02/02/21  Yes Shamleffer, Melanie Crazier, MD  hydrALAZINE (APRESOLINE) 100 MG tablet TAKE 1 TABLET BY MOUTH THREE TIMES DAILY ** PLEASE SCHEDULE AN APPOINTMENT FOR FUTURE REFILLS** Patient taking differently: Take 50 mg by mouth 2 (two) times daily. 02/10/21  Yes Larey Dresser, MD  insulin aspart (NOVOLOG FLEXPEN) 100 UNIT/ML FlexPen Inject 10 Units into the skin 2 (two) times daily with breakfast and lunch AND 12 Units daily with supper. Patient taking differently: Inject 10 Units into the skin 2 (two) times daily with breakfast and lunch and 12 units with supper. 02/06/20  Yes Shamleffer, Melanie Crazier, MD  insulin degludec (TRESIBA FLEXTOUCH) 100 UNIT/ML FlexTouch Pen Inject 18 Units into the skin daily. 02/06/20   Yes Shamleffer, Melanie Crazier, MD  Insulin Pen Needle 32G X 4 MM MISC 1 Device by Does not apply route in the morning, at noon, in the evening, and at bedtime. 02/06/20  Yes Shamleffer, Melanie Crazier, MD  isosorbide mononitrate (IMDUR) 30 MG 24 hr tablet Take 1 tablet by mouth once daily Patient taking differently: Take 30 mg by mouth daily. 09/10/20  Yes Larey Dresser, MD  ketoconazole (NIZORAL) 2 % cream Apply 1 application topically every morning. 02/16/21  Yes [provider]  ketoconazole (NIZORAL) 2 % shampoo Apply 1 application topically See admin instructions. 2-3 times weekly 02/16/21  Yes [provider]  latanoprost (XALATAN) 0.005 % ophthalmic solution Place 1 drop into both eyes at bedtime.   Yes [provider]  lidocaine (LIDODERM) 5 % Place 1 patch onto the skin daily. 11/12/20  Yes [provider]  lidocaine (LINDAMANTLE) 3 % CREA cream Apply 1 application topically as needed (Before dialysis on Tuesdays, Thursday, and Saturday.). 03/06/20  Yes [provider]  metoprolol succinate (TOPROL-XL) 25 MG 24 hr tablet TAKE 2 TABLETS BY MOUTH ONCE DAILY IN THE MORNING AND THEN TAKE 1 TABLET BY MOUTH ONCE DAILY IN THE EVENING Patient taking differently: Take 25-50 mg by mouth See admin instructions. TAKE 2 TABLETS BY MOUTH ONCE DAILY IN THE MORNING AND THEN TAKE 1 TABLET BY MOUTH ONCE DAILY IN THE EVENING 02/13/21  Yes Larey Dresser, MD  mirtazapine (REMERON) 15 MG tablet Take 15 mg by mouth at bedtime.   Yes [provider]  ondansetron (ZOFRAN) 4 MG tablet Take 4 mg by mouth 2 (two) times daily as needed for nausea.  10/26/18  Yes [provider]  pantoprazole (PROTONIX) 40 MG tablet Take 40 mg by mouth every evening.  10/26/18  Yes [provider]  sucroferric oxyhydroxide (VELPHORO) 500 MG chewable tablet Chew 500 mg by mouth See admin instructions. Crush or chew or swallow 1 tablet three times a day with meals and  1 tablet two times a day with snacks.   Yes [provider]  NARCAN 4 MG/0.1ML LIQD nasal spray kit Place 1 spray into the nose See admin instructions. Call EMS at first sign of opioid overdose. Place on spray in nose.  If no response, then place one spray ing the opposite side of the nose. 01/02/19   [provider]  traMADol (ULTRAM) 50 MG tablet Take 50 mg by mouth every 6 (six) hours as needed for severe pain (for pain.).  Patient not taking: Reported on 02/17/2021    [provider]    Physical Exam: Vitals:   02/17/21 1730 02/17/21 1739  02/17/21 1800 02/17/21 1830  BP: 110/69 130/63 (!) 122/51 (!) 115/51  Pulse: 84 92 73 75  Resp: '20 20 18 14  ' Temp:      TempSrc:      SpO2: 99% 91% 99% 100%  Weight:      Height:       Constitutional: Resting in bed with head elevated, NAD, calm, comfortable Eyes: PERRL, lids and conjunctivae normal ENMT: Mucous membranes are moist. Posterior pharynx clear of any exudate or lesions.Normal dentition.  Neck: normal, supple, no masses. Respiratory: Bibasilar inspiratory crackles. Normal respiratory effort. No accessory muscle use.  LUE aVF with palpable thrill. Cardiovascular: Irregular, no murmurs / rubs / gallops.  Trace bilateral lower extremity edema. Abdomen: no tenderness, no masses palpated. No hepatosplenomegaly. Bowel sounds positive.  Musculoskeletal: no clubbing / cyanosis. No joint deformity upper and lower extremities. Good ROM, no contractures. Normal muscle tone.  Skin: no rashes, lesions, ulcers. No induration Neurologic: CN 2-12 grossly intact. Sensation intact. Strength 5/5 in all 4.  Psychiatric: Normal judgment and insight. Alert and oriented x 3. Normal mood.   Labs on Admission: I have personally reviewed following labs and imaging studies  CBC: Recent Labs  Lab 02/17/21 1420  WBC 13.7*  HGB 11.9*  HCT 37.2  MCV 96.9  PLT 979   Basic Metabolic Panel: Recent Labs  Lab 02/17/21 1420  NA  141  K 3.3*  CL 95*  CO2 32  GLUCOSE 205*  BUN 22  CREATININE 4.42*  CALCIUM 8.1*   GFR: Estimated Creatinine Clearance: 10.9 mL/min (A) (by C-G formula based on SCr of 4.42 mg/dL (H)). Liver Function Tests: No results for input(s): AST, ALT, ALKPHOS, BILITOT, PROT, ALBUMIN in the last 168 hours. No results for input(s): LIPASE, AMYLASE in the last 168 hours. No results for input(s): AMMONIA in the last 168 hours. Coagulation Profile: No results for input(s): INR, PROTIME in the last 168 hours. Cardiac Enzymes: No results for input(s): CKTOTAL, CKMB, CKMBINDEX, TROPONINI in the last 168 hours. BNP (last 3 results) No results for input(s): PROBNP in the last 8760 hours. HbA1C: No results for input(s): HGBA1C in the last 72 hours. CBG: No results for input(s): GLUCAP in the last 168 hours. Lipid Profile: No results for input(s): CHOL, HDL, LDLCALC, TRIG, CHOLHDL, LDLDIRECT in the last 72 hours. Thyroid Function Tests: No results for input(s): TSH, T4TOTAL, FREET4, T3FREE, THYROIDAB in the last 72 hours. Anemia Panel: No results for input(s): VITAMINB12, FOLATE, FERRITIN, TIBC, IRON, RETICCTPCT in the last 72 hours. Urine analysis:    Component Value Date/Time   COLORURINE YELLOW 01/24/2019 1652   APPEARANCEUR HAZY (A) 01/24/2019 1652   LABSPEC 1.011 01/24/2019 1652   PHURINE 6.0 01/24/2019 1652   GLUCOSEU NEGATIVE 01/24/2019 1652   HGBUR NEGATIVE 01/24/2019 1652   BILIRUBINUR NEGATIVE 01/24/2019 1652   KETONESUR NEGATIVE 01/24/2019 1652   PROTEINUR 100 (A) 01/24/2019 1652   UROBILINOGEN 0.2 01/23/2019 1718   NITRITE NEGATIVE 01/24/2019 1652   LEUKOCYTESUR NEGATIVE 01/24/2019 1652    Radiological Exams on Admission: DG Chest Portable 1 View  Result Date: 02/17/2021 CLINICAL DATA:  Shortness of breath, AFib EXAM: PORTABLE CHEST 1 VIEW COMPARISON:  Chest radiograph 06/08/2020 FINDINGS: The heart is enlarged, unchanged. The upper mediastinal contours are within normal  limits. There is vascular congestion and probable mild pulmonary interstitial edema. There are trace bilateral pleural effusions. There is no pneumothorax There is no acute osseous abnormality. IMPRESSION: Cardiomegaly with mild pulmonary interstitial edema and trace bilateral pleural  effusions suggesting CHF. Electronically Signed   By: Valetta Mole M.D.   On: 02/17/2021 15:05    EKG: Personally reviewed.  Initial EKG showed atrial fibrillation with RVR, rate 138, LBBB.  Repeat EKG after cardioversion showed sinus arrhythmia, LBBB.  Assessment/Plan Principal Problem:   Paroxysmal atrial fibrillation with RVR (HCC) Active Problems:   COPD (chronic obstructive pulmonary disease) (HCC)   Type 2 diabetes mellitus, controlled, with renal complications (HCC)   Hyperlipidemia   Acute on chronic diastolic CHF (congestive heart failure) ([place])   ESRD on hemodialysis (HCC)   Lawrence Swantek is a 80 y.o. female with medical history significant for ESRD on TTS HD, paroxysmal atrial fibrillation on Eliquis, chronic diastolic CHF, LBBB, insulin-dependent type 2 diabetes, COPD, hyperlipidemia who is admitted with acute on chronic diastolic CHF in setting of paroxysmal atrial fibrillation with RVR.  Paroxysmal atrial fibrillation with RVR: S/p cardioversion in the ED to sinus rhythm however reverted back to A. fib.  Rate is currently controlled. -Cardiology following -Continue home Toprol-XL -Continue Eliquis  Acute on chronic diastolic CHF/pulmonary edema: Likely secondary to A. fib with RVR.  Nephrology plan for scheduled dialysis in the morning.  ESRD on TTS HD: Unable to complete full HD session prior to admission due to A. fib with RVR and dyspnea.  Nephrology following and plan for dialysis in the morning.  Insulin-dependent diabetes: Placed on very sensitive SSI.  COPD: Stable without wheezing on admission.  Continue Symbicort and albuterol as needed.  Hypertension: Continue Toprol-XL as  above.  Holding hydralazine and Imdur due to soft blood pressures on admission.  Hyperlipidemia: Continue atorvastatin.  DVT prophylaxis: Eliquis Code Status: Full code, confirmed with patient on admission Family Communication: Discussed with patient's 2 daughters at bedside Disposition Plan: From home and likely return to home pending clinical progress Consults called: Cardiology, nephrology Level of care: Telemetry Cardiac Admission status:  Status is: Observation  The patient remains OBS appropriate and will d/c before 2 midnights.  Zada Finders MD [facility]  If 7PM-7AM, please contact night-coverage [email or web address]  02/17/2021, 8:31 PM

## 2021-02-17 NOTE — ED Provider Notes (Signed)
[facility] EMERGENCY DEPARTMENT Provider Note   CSN: [id number] Arrival date & time: 02/17/21  1402     History Chief Complaint  Patient presents with   Atrial Fibrillation    Stephanie Woodward is a 80 y.o. female.  HPI     80yo female with history of ESRD on dialysis Tuesday/Thursday/Saturday, chronic diastolic heart failure, COPD, diabetes, hypertension, hyperlipidemia, pulmonary hypertension, paroxysmal atrial fibrillation on Eliquis who presents with concern for shortness of breath, chest tightness this AM, palpitations and atrial fibrillation with RVR noted at dialysis.    History of atrial fibrillation on Eliquis, had spontaneously converted previously, previously had been cardioverted. Was admitted to [facility] about 10 days ago for afib. Has had 4 known episodes of atrial fibrillation but family wonders if she has had more. On metoprolol Feels like symptoms have improved since coming to ED.  This AM at 8AM felt short of breath, felt short of breath just getting socks on. No orthopnea last night.  5/10 shortness of breath. No chest pain.  No new cough, no fever.  Did not feel palpitations until she was at dialysis and felt heart racing. Taking eliquis, last dose this AM. Dyspnea worse since this AM.  No worsening leg swelling.  Went to dialysis at 1130AM, came off at 1PM.  Heart racing started about a quarter to 1PM Does fluid restrict, makes very little urine    Past Medical History:  Diagnosis Date   Arthritis    Asthma    Atrial fibrillation ([place])    CHF (congestive heart failure) ([place])    CKD (chronic kidney disease), stage III ([place])    COPD (chronic obstructive pulmonary disease) ([place])    Diabetes mellitus    INSULIN DEPENDENT   Gout    Heart murmur    no issues per pt   History of kidney stones    Hyperlipemia    Hypertension    Psoriasis    Renal disorder    congenital   Single kidney    Sleep apnea    doesn't use the Cpap     Patient Active Problem List   Diagnosis Date Noted   Acute on chronic diastolic CHF (congestive heart failure) ([place]) 02/17/2021   ESRD on hemodialysis ([place]) 02/17/2021   Nausea without vomiting 05/16/2020   Paroxysmal atrial fibrillation ([place]) 10/31/2019   Secondary hypercoagulable state ([place]) 10/31/2019   Type 2 diabetes mellitus with hyperglycemia, with long-term current use of insulin ([place]) 02/02/2019   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 ([place]) 02/02/2019   Type 2 diabetes mellitus with stage 4 chronic kidney disease, with long-term current use of insulin ([place]) 02/02/2019   Acute renal failure superimposed on chronic kidney disease ([place]) 01/24/2019   Leukocytosis 01/24/2019   Hypokalemia 01/24/2019   Atrial fibrillation, chronic ([place]) 01/24/2019   Chronic anticoagulation 01/24/2019   Chronic kidney disease (CKD), stage IV (severe) ([place]) 10/31/2018   Paroxysmal atrial fibrillation with RVR ([place]) 04/08/2016   Chest pain 04/04/2016   Acute on chronic renal failure ([place]) 03/09/2016   Diabetes mellitus with complication ([place]) 99/69/2493   Foot pain, bilateral 03/09/2016   Renal disorder    Bradycardia    CHF (congestive heart failure) ([place]) 02/06/2014   Cellulitis of right lower extremity 02/06/2014   Essential hypertension 02/06/2014   Hyperlipidemia 02/06/2014   Uncontrolled type 2 diabetes mellitus with hypoglycemia ([place]) 02/06/2014   Cellulitis 02/06/2014   Hypoxemia 12/07/2013   Type 2 diabetes mellitus, controlled, with renal complications (  [place]) 12/07/2013   Kidney disease, chronic, stage III (GFR 30-59 ml/min) (HCC) 12/07/2013   Pulmonary hypertension ([place]) 50/35/4656   Chronic systolic congestive heart failure (HCC)    SOB (shortness of breath) 12/05/2013   COPD (chronic obstructive pulmonary disease) ([place])    Gout 11/11/2009   Obesity 11/11/2009   Hypertensive heart disease     Past Surgical History:  Procedure Laterality Date    A/V FISTULAGRAM Left 10/31/2020   Procedure: A/V FISTULAGRAM;  Surgeon: Angelia Mould, MD;  Location: [facility];  Service: Cardiovascular;  Laterality: Left;   ABDOMINAL HYSTERECTOMY     AV FISTULA PLACEMENT Left 11/17/2018   Procedure: BRACHIO-CEPHALIC ARTERIOVENOUS (AV) FISTULA CREATION IN LEFT ARM;  Surgeon: Marty Heck, MD;  Location: [facility];  Service: Vascular;  Laterality: Left;   CHOLECYSTECTOMY     INSERTION OF DIALYSIS CATHETER Right 01/26/2019   Procedure: INSERTION OF DIALYSIS CATHETER, right internal jugular;  Surgeon: Angelia Mould, MD;  Location: [facility];  Service: Vascular;  Laterality: Right;   LEFT AND RIGHT HEART CATHETERIZATION WITH CORONARY ANGIOGRAM N/A 11/29/2013   Procedure: LEFT AND RIGHT HEART CATHETERIZATION WITH CORONARY ANGIOGRAM;  Surgeon: Jacolyn Reedy, MD;  Location: [facility] CATH LAB;  Service: Cardiovascular;  Laterality: N/A;   PERIPHERAL VASCULAR INTERVENTION Left 10/31/2020   Procedure: PERIPHERAL VASCULAR INTERVENTION;  Surgeon: Angelia Mould, MD;  Location: [facility];  Service: Cardiovascular;  Laterality: Left;   RIGHT HEART CATH N/A 11/23/2017   Procedure: RIGHT HEART CATH;  Surgeon: Larey Dresser, MD;  Location: [facility];  Service: Cardiovascular;  Laterality: N/A;   RIGHT/LEFT HEART CATH AND CORONARY ANGIOGRAPHY N/A 06/15/2019   Procedure: RIGHT/LEFT HEART CATH AND CORONARY ANGIOGRAPHY;  Surgeon: Larey Dresser, MD;  Location: [facility];  Service: Cardiovascular;  Laterality: N/A;     OB History   No obstetric history on file.     Family History  Problem Relation Age of Onset   Lupus Daughter    Cancer Mother 64       type unknown   CVA Father 75   Cancer Brother 76       type unknown    Social History   Tobacco Use   Smoking status: Former    Packs/day: 1.00    Years: 20.00    Pack years: 20.00    Types: Cigarettes    Quit date: 02/22/2009    Years since quitting: 11.9    Smokeless tobacco: Never  Vaping Use   Vaping Use: Never used  Substance Use Topics   Alcohol use: Yes    Alcohol/week: 0.0 standard drinks    Comment: occasional beer   Drug use: Not Currently    Comment: marijuana in the past    Home Medications Prior to Admission medications   Medication Sig Start Date End Date Taking? Authorizing Provider  acetaminophen (TYLENOL) 500 MG tablet Take 500 mg by mouth every 6 (six) hours as needed (pain).   Yes [provider]  albuterol (PROVENTIL HFA;VENTOLIN HFA) 108 (90 BASE) MCG/ACT inhaler Inhale 2 puffs into the lungs every 4 (four) hours as needed for shortness of breath.   Yes [provider]  atorvastatin (LIPITOR) 80 MG tablet Take 80 mg by mouth every evening.    Yes [provider]  B Complex-C-Zn-Folic Acid (DIALYVITE 812-XNTZ 15) 0.8 MG TABS Take 1 tablet by mouth in the morning. 03/07/19  Yes [provider]  Blood Glucose  Monitoring Suppl (FREESTYLE LITE) DEVI Use as instructed to check blood sugar 4 times daily Patient taking differently: 1 each by Other route See admin instructions. Use as instructed to check blood sugar 4 times daily 12/29/20  Yes Shamleffer, Melanie Crazier, MD  budesonide-formoterol (SYMBICORT) 160-4.5 MCG/ACT inhaler Inhale 2 puffs into the lungs 2 (two) times daily. 04/09/20  Yes Byrum, Rose Fillers, MD  Continuous Blood Gluc Receiver (FREESTYLE LIBRE READER) DEVI 1 kit by Does not apply route daily. Use as instructed E11.65 09/12/19  Yes Shamleffer, Melanie Crazier, MD  ELIQUIS 5 MG TABS tablet Take 1 tablet by mouth twice daily Patient taking differently: Take 5 mg by mouth 2 (two) times daily. 07/23/20  Yes Larey Dresser, MD  glucose blood (FREESTYLE LITE) test strip USE AS DIRECTED 4 TIMES DAILY Patient taking differently: 1 each by Other route See admin instructions. USE AS DIRECTED 4 TIMES DAILY 02/02/21  Yes Shamleffer, Melanie Crazier, MD  hydrALAZINE (APRESOLINE) 100 MG tablet  TAKE 1 TABLET BY MOUTH THREE TIMES DAILY ** PLEASE SCHEDULE AN APPOINTMENT FOR FUTURE REFILLS** Patient taking differently: Take 50 mg by mouth 2 (two) times daily. 02/10/21  Yes Larey Dresser, MD  insulin aspart (NOVOLOG FLEXPEN) 100 UNIT/ML FlexPen Inject 10 Units into the skin 2 (two) times daily with breakfast and lunch AND 12 Units daily with supper. Patient taking differently: Inject 10 Units into the skin 2 (two) times daily with breakfast and lunch and 12 units with supper. 02/06/20  Yes Shamleffer, Melanie Crazier, MD  insulin degludec (TRESIBA FLEXTOUCH) 100 UNIT/ML FlexTouch Pen Inject 18 Units into the skin daily. 02/06/20  Yes Shamleffer, Melanie Crazier, MD  Insulin Pen Needle 32G X 4 MM MISC 1 Device by Does not apply route in the morning, at noon, in the evening, and at bedtime. 02/06/20  Yes Shamleffer, Melanie Crazier, MD  isosorbide mononitrate (IMDUR) 30 MG 24 hr tablet Take 1 tablet by mouth once daily Patient taking differently: Take 30 mg by mouth daily. 09/10/20  Yes Larey Dresser, MD  ketoconazole (NIZORAL) 2 % cream Apply 1 application topically every morning. 02/16/21  Yes [provider]  ketoconazole (NIZORAL) 2 % shampoo Apply 1 application topically See admin instructions. 2-3 times weekly 02/16/21  Yes [provider]  latanoprost (XALATAN) 0.005 % ophthalmic solution Place 1 drop into both eyes at bedtime.   Yes [provider]  lidocaine (LIDODERM) 5 % Place 1 patch onto the skin daily. 11/12/20  Yes [provider]  lidocaine (LINDAMANTLE) 3 % CREA cream Apply 1 application topically as needed (Before dialysis on Tuesdays, Thursday, and Saturday.). 03/06/20  Yes [provider]  metoprolol succinate (TOPROL-XL) 25 MG 24 hr tablet TAKE 2 TABLETS BY MOUTH ONCE DAILY IN THE MORNING AND THEN TAKE 1 TABLET BY MOUTH ONCE DAILY IN THE EVENING Patient taking differently: Take 25-50 mg by mouth See admin instructions. TAKE 2  TABLETS BY MOUTH ONCE DAILY IN THE MORNING AND THEN TAKE 1 TABLET BY MOUTH ONCE DAILY IN THE EVENING 02/13/21  Yes Larey Dresser, MD  mirtazapine (REMERON) 15 MG tablet Take 15 mg by mouth at bedtime.   Yes [provider]  ondansetron (ZOFRAN) 4 MG tablet Take 4 mg by mouth 2 (two) times daily as needed for nausea.  10/26/18  Yes [provider]  pantoprazole (PROTONIX) 40 MG tablet Take 40 mg by mouth every evening.  10/26/18  Yes [provider]  sucroferric oxyhydroxide (VELPHORO) 500 MG chewable  tablet Chew 500 mg by mouth See admin instructions. Crush or chew or swallow 1 tablet three times a day with meals and 1 tablet two times a day with snacks.   Yes [provider]  NARCAN 4 MG/0.1ML LIQD nasal spray kit Place 1 spray into the nose See admin instructions. Call EMS at first sign of opioid overdose. Place on spray in nose.  If no response, then place one spray ing the opposite side of the nose. 01/02/19   [provider]  traMADol (ULTRAM) 50 MG tablet Take 50 mg by mouth every 6 (six) hours as needed for severe pain (for pain.).  Patient not taking: Reported on 02/17/2021    [provider]    Allergies    Eggs or egg-derived products, Lisinopril, Penicillins, and Other  Review of Systems   Review of Systems  Constitutional:  Positive for fatigue. Negative for fever.  HENT:  Negative for sore throat.   Eyes:  Negative for visual disturbance.  Respiratory:  Positive for shortness of breath. Negative for cough.   Cardiovascular:  Positive for chest pain.  Gastrointestinal:  Negative for abdominal pain.  Genitourinary:  Negative for difficulty urinating.  Musculoskeletal:  Negative for back pain and neck pain.  Skin:  Negative for rash.  Neurological:  Negative for syncope and headaches.   Physical Exam Updated Vital Signs BP (!) 119/52 (BP Location: Right Arm)    Pulse 88    Temp 97.9 F (36.6 C) (Oral)    Resp 18    Ht _0   (1.6 m)    Wt 89.8 kg    SpO2 100%    BMI 35.07 kg/m   Physical Exam Vitals and nursing note reviewed.  Constitutional:      General: She is not in acute distress.    Appearance: She is well-developed. She is not diaphoretic.  HENT:     Head: Normocephalic and atraumatic.  Eyes:     Conjunctiva/sclera: Conjunctivae normal.  Cardiovascular:     Rate and Rhythm: Tachycardia present. Rhythm irregular.     Heart sounds: Normal heart sounds. No murmur heard.   No friction rub. No gallop.  Pulmonary:     Effort: Pulmonary effort is normal. No respiratory distress.     Breath sounds: Normal breath sounds. No wheezing or rales.  Abdominal:     General: There is no distension.     Palpations: Abdomen is soft.     Tenderness: There is no abdominal tenderness. There is no guarding.  Musculoskeletal:        General: No tenderness.     Cervical back: Normal range of motion.  Skin:    General: Skin is warm and dry.     Findings: No erythema or rash.  Neurological:     Mental Status: She is alert and oriented to person, place, and time.    ED Results / Procedures / Treatments   Labs (all labs ordered are listed, but only abnormal results are displayed) Labs Reviewed  BASIC METABOLIC PANEL - Abnormal; Notable for the following components:      Result Value   Potassium 3.3 (*)    Chloride 95 (*)    Glucose, Bld 205 (*)    Creatinine, Ser 4.42 (*)    Calcium 8.1 (*)    GFR, Estimated 10 (*)    All other components within normal limits  CBC - Abnormal; Notable for the following components:   WBC 13.7 (*)    RBC  3.84 (*)    Hemoglobin 11.9 (*)    All other components within normal limits  CBC - Abnormal; Notable for the following components:   WBC 11.5 (*)    RBC 3.28 (*)    Hemoglobin 10.1 (*)    HCT 32.9 (*)    MCV 100.3 (*)    All other components within normal limits  BASIC METABOLIC PANEL - Abnormal; Notable for the following components:   Chloride 96 (*)    CO2 33 (*)     Glucose, Bld 187 (*)    BUN 26 (*)    Creatinine, Ser 5.77 (*)    Calcium 8.1 (*)    GFR, Estimated 7 (*)    All other components within normal limits  HEMOGLOBIN A1C - Abnormal; Notable for the following components:   Hgb A1c MFr Bld 9.1 (*)    All other components within normal limits  HEPATITIS B SURFACE ANTIBODY,QUALITATIVE - Abnormal; Notable for the following components:   Hep B S Ab Reactive (*)    All other components within normal limits  CBG MONITORING, ED - Abnormal; Notable for the following components:   Glucose-Capillary 175 (*)    All other components within normal limits  CBG MONITORING, ED - Abnormal; Notable for the following components:   Glucose-Capillary 131 (*)    All other components within normal limits  TROPONIN I (HIGH SENSITIVITY) - Abnormal; Notable for the following components:   Troponin I (High Sensitivity) 64 (*)    All other components within normal limits  TROPONIN I (HIGH SENSITIVITY) - Abnormal; Notable for the following components:   Troponin I (High Sensitivity) 67 (*)    All other components within normal limits  RESP PANEL BY RT-PCR (FLU A&B, COVID) [id number]  HEPATITIS B SURFACE ANTIGEN  PHOSPHORUS  HEPATITIS B SURFACE ANTIBODY, QUANTITATIVE  HEPATITIS B SURFACE ANTIGEN  HEPATITIS B CORE ANTIBODY, TOTAL  HEPATITIS B SURFACE ANTIBODY, QUANTITATIVE  TSH  HEPATIC FUNCTION PANEL    EKG EKG Interpretation  Date/Time:  Tuesday February 17 2021 17:28:04 EST Ventricular Rate:  73 PR Interval:  160 QRS Duration: 133 QT Interval:  440 QTC Calculation: 485 R Axis:   270 Text Interpretation: Suspect sinus rhythm with sinus arrhythmia Nonspecific IVCD with LAD Inferior infarct, old Anterior infarct, old Confirmed by Gareth Morgan [phone]) on 02/17/2021 7:12:15 PM  Radiology DG Chest Portable 1 View  Result Date: 02/17/2021 CLINICAL DATA:  Shortness of breath, AFib EXAM: PORTABLE CHEST 1 VIEW COMPARISON:  Chest radiograph 06/08/2020  FINDINGS: The heart is enlarged, unchanged. The upper mediastinal contours are within normal limits. There is vascular congestion and probable mild pulmonary interstitial edema. There are trace bilateral pleural effusions. There is no pneumothorax There is no acute osseous abnormality. IMPRESSION: Cardiomegaly with mild pulmonary interstitial edema and trace bilateral pleural effusions suggesting CHF. Electronically Signed   By: Valetta Mole M.D.   On: 02/17/2021 15:05    Procedures .Critical Care Performed by: Gareth Morgan, MD Authorized by: Gareth Morgan, MD   Critical care provider statement:    Critical care time (minutes):  30   Critical care was time spent personally by me on the following activities:  Development of treatment plan with patient or surrogate, discussions with consultants, evaluation of patient's response to treatment, examination of patient, ordering and review of laboratory studies, ordering and review of radiographic studies, ordering and performing treatments and interventions, pulse oximetry, re-evaluation of patient's condition and review of old charts .Cardioversion  Date/Time: 02/18/2021 3:55  PM Performed by: Gareth Morgan, MD Authorized by: Gareth Morgan, MD   Pre-procedure details:    Cardioversion basis:  Elective   Rhythm:  Atrial fibrillation Attempt one:    Cardioversion mode:  Synchronous   Shock (Joules):  120 Post-procedure details:    Patient status:  Unresponsive   Patient tolerance of procedure:  Tolerated well, no immediate complications .Sedation  Date/Time: 02/18/2021 3:56 PM Performed by: Gareth Morgan, MD Authorized by: Gareth Morgan, MD   Consent:    Consent obtained:  Written   Consent given by:  Patient   Risks discussed:  Allergic reaction, inadequate sedation, dysrhythmia, nausea, respiratory compromise necessitating ventilatory assistance and intubation, prolonged sedation necessitating reversal and prolonged  hypoxia resulting in organ damage Universal protocol:    Immediately prior to procedure, a time out was called: yes     Patient identity confirmed:  Arm band Indications:    Procedure performed:  Cardioversion Pre-sedation assessment:    Time since last food or drink:  7   ASA classification: class 3 - patient with severe systemic disease     Mouth opening:  3 or more finger widths   Mallampati score:  III - soft palate, base of uvula visible   Neck mobility: normal     Pre-sedation assessments completed and reviewed: airway patency, cardiovascular function, hydration status, mental status, nausea/vomiting, pain level, respiratory function and temperature   Procedure details (see MAR for exact dosages):    Preoxygenation:  Nasal cannula   Analgesia:  Fentanyl   Total Provider sedation time (minutes):  20 Post-procedure details:    Patient is stable for discharge or admission: yes     Procedure completion:  Tolerated well, no immediate complications Comments:     Etomidate $RemoveBe'8mg'[id number]$    Medications Ordered in ED Medications  Chlorhexidine Gluconate Cloth 2 % PADS 6 each (has no administration in time range)  sodium chloride flush (NS) 0.9 % injection 3 mL (3 mLs Intravenous Not Given 02/17/21 2232)  acetaminophen (TYLENOL) tablet 650 mg (has no administration in time range)    Or  acetaminophen (TYLENOL) suppository 650 mg (has no administration in time range)  ondansetron (ZOFRAN) tablet 4 mg (has no administration in time range)    Or  ondansetron (ZOFRAN) injection 4 mg (has no administration in time range)  senna-docusate (Senokot-S) tablet 1 tablet (has no administration in time range)  insulin aspart (novoLOG) injection 0-6 Units (0 Units Subcutaneous Not Given 02/18/21 0833)  insulin aspart (novoLOG) injection 0-5 Units (0 Units Subcutaneous Not Given 02/17/21 2220)  albuterol (PROVENTIL) (2.5 MG/3ML) 0.083% nebulizer solution 3 mL (has no administration in time range)   atorvastatin (LIPITOR) tablet 80 mg (80 mg Oral Given 02/17/21 2229)  mometasone-formoterol (DULERA) 200-5 MCG/ACT inhaler 2 puff (2 puffs Inhalation Not Given 02/18/21 1426)  apixaban (ELIQUIS) tablet 5 mg (5 mg Oral Given 02/17/21 2229)  pantoprazole (PROTONIX) EC tablet 40 mg (40 mg Oral Given 02/17/21 2229)  latanoprost (XALATAN) 0.005 % ophthalmic solution 1 drop (1 drop Both Eyes Given 02/17/21 2232)  mirtazapine (REMERON) tablet 15 mg (15 mg Oral Given 02/17/21 2229)  metoprolol succinate (TOPROL-XL) 24 hr tablet 25 mg (has no administration in time range)    And  metoprolol succinate (TOPROL-XL) 24 hr tablet 50 mg (has no administration in time range)  amiodarone (PACERONE) tablet 200 mg (has no administration in time range)  diltiazem (CARDIZEM) 1 mg/mL load via infusion 10 mg (10 mg Intravenous Bolus from Bag 02/17/21 1603)  traMADol (ULTRAM)  tablet 50 mg (50 mg Oral Given 02/17/21 1620)  fentaNYL (SUBLIMAZE) injection 25 mcg (25 mcg Intravenous Given 02/17/21 1710)  etomidate (AMIDATE) injection 8 mg (8 mg Intravenous Given 02/17/21 1721)  ondansetron (ZOFRAN) injection 4 mg (4 mg Intravenous Given 02/17/21 1751)    ED Course  I have reviewed the triage vital signs and the nursing notes.  Pertinent labs & imaging results that were available during my care of the patient were reviewed by me and considered in my medical decision making (see chart for details).    MDM Rules/Calculators/A&P                          80yo female with history of ESRD on dialysis Tuesday/Thursday/Saturday, chronic diastolic heart failure, COPD, diabetes, hypertension, hyperlipidemia, pulmonary hypertension, paroxysmal atrial fibrillation on Eliquis who presents with concern for shortness of breath, chest tightness this AM, palpitations and atrial fibrillation with RVR noted at dialysis.  Arrives to the ED in atrial fibrillation, volume overload on XR likely related to incomplete dialysis today and  atrial fibrillation. Initially started cardizem however discussed with Cardiology and patient and determined compliance with eliquis she is candidate for cardioversion.   Oxygen saturations 91% prior to cardioversion, hoping to cardiovert with improvement and close outpatient follow up.  Cardioverted with etomidate sedation as above.  Was cardioverted to sinus rhythm but continues to have short episodes of atrial fibrillation.  Oxygen saturations down to 87% on room air likely secondary to incomplete dialysis.  Discussed with Nephrology. Will admit for coninued care, dialysis, monitoring in setting of afib.  Consulted Cardiology to see regarding continued short episodes of afib/considering antiarrhythmic.  Admitted for further care.      Final Clinical Impression(s) / ED Diagnoses Final diagnoses:  Atrial fibrillation with RVR ([place])  Acute respiratory failure with hypoxia ([facility])  Hypervolemia, unspecified hypervolemia type    Rx / DC Orders ED Discharge Orders     None        Gareth Morgan, MD 02/18/21 1600

## 2021-02-17 NOTE — ED Triage Notes (Signed)
Pt BIBA from dialysis. Pt was about 1 hour in when she developed [facility] and heart racing. Recently dx with afib. This is pt's 4th afib episode. HR around 160s.  Dialysis access in left arm.   116/60 167 224 CBG 97% RA

## 2021-02-17 NOTE — Consult Note (Signed)
Reason for Consult: ESRD    Referring Physician:  Dr. Zada Finders  Chief Complaint: [facility]  TueThuSat, 4 hrs 0 min, 180NRe Optiflux, BFR 350, DFR Manual 500 mL/min, EDW 87.2 (kg), Dialysate 2.0 K, 2.0 Ca, 1.0 Mg, 100 Dextrose (G2201), Sodium 137 (mEq/L), Bicarb Setting: 35 (mEq/L), UFR Profile: Profile 2, Sodium Model: None Hectorol 49mg IV qtx, Venofer 50 qwk, has not req Mircera  Assessment/Plan:  1.  End-stage renal disease  With nice  left BCF with last treatment only 1-1/2 hrs on 02/17/21 but on Saturday she received a full treatment leaving below her EDW.  - Plan on next HD in the AM; daughter to bring her EMLA cream.  Additional recommendations - Dose all meds for creatinine clearance < 10 ml/min  - Unless absolutely necessary, no MRIs with gadolinium.  - Prefer needle sticks in the dorsum of the hands or wrists.  No blood pressure measurements in left  arm. - If blood transfusion is requested during hemodialysis sessions, please alert uKoreaprior to the session.   2.  Hypertension: Blood pressures currently under good control, will continue to follow with hemodialysis. 3.  Moderate to severe pulmonary hypertension: With combined systolic/diastolic CHF and currently appears to be compensated-we will continue to follow with UF on HD 4.  Paroxysmal atrial fibrillation: Appears to be rate controlled at this time but back in afib after CV in in the ED on  Eliquis.  5.  Secondary hyperparathyroidism: Will check phosphorus level for binder management. 6.  Anemia of chronic kidney disease: Hemoglobin/hematocrit currently at goal. 7. DM    HPI: MMaizey Menendezis an 80y.o. female pulmonary HTN COPD diastolic HF, obesity PAF on Eliquis, IDDM HLD ESRD TTS presenting from dialysis with shortness of breath and tachycardia. She was found to be in afib with RVR cardioverted in the ED. Her echocardiogram in 02/2019 revealed preserved LVEF, mild pulmonary hypertension. [facility] Dr. UHollie Salkand only had 1-1/2 hrs of treatment today; her prior full treatment was on Saturday where she left at her EDW.  She was cardioverted in the ED to NSR but has reverted back to afib but is rate controlled and comfortable. She denies palpitations, chest pain, fever, chills, sore throat or myalgias. States that her breathing is at baseline; she quit smoking 15 years ago.  ROS Pertinent items are noted in HPI.  Chemistry and CBC: Creatinine, Ser  Date/Time Value Ref Range Status  02/17/2021 02:20 PM 4.42 (H) 0.44 - 1.00 mg/dL Final  10/31/2020 06:23 AM 4.10 (H) 0.44 - 1.00 mg/dL Final  06/08/2020 10:11 AM 4.19 (H) 0.44 - 1.00 mg/dL Final  10/24/2019 04:07 PM 5.26 (H) 0.44 - 1.00 mg/dL Final  06/15/2019 08:15 AM 4.20 (H) 0.44 - 1.00 mg/dL Final  06/04/2019 11:35 AM 5.53 (H) 0.44 - 1.00 mg/dL Final  01/31/2019 02:22 AM 2.58 (H) 0.44 - 1.00 mg/dL Final  01/30/2019 06:27 AM 2.74 (H) 0.44 - 1.00 mg/dL Final  01/29/2019 05:15 AM 3.65 (H) 0.44 - 1.00 mg/dL Final  01/28/2019 05:50 AM 3.17 (H) 0.44 - 1.00 mg/dL Final  01/27/2019 04:47 AM 3.77 (H) 0.44 - 1.00 mg/dL Final  01/26/2019 02:16 AM 4.12 (H) 0.44 - 1.00 mg/dL Final  01/25/2019 03:23 AM 4.55 (H) 0.44 - 1.00 mg/dL Final  01/24/2019 08:20 AM 4.69 (H) 0.44 - 1.00 mg/dL Final  01/23/2019 09:57 PM 4.82 (H) 0.44 - 1.00 mg/dL Final  01/23/2019 06:22 PM 4.76 (H) 0.44 - 1.00 mg/dL Final  11/17/2018 09:10 AM 2.60 (H) 0.44 - 1.00 mg/dL Final  10/24/2018 02:36 PM 2.32 (H) 0.44 - 1.00 mg/dL Final  09/28/2018 10:00 AM 2.42 (H) 0.44 - 1.00 mg/dL Final  09/18/2018 10:52 AM 2.53 (H) 0.44 - 1.00 mg/dL Final  07/26/2018 01:57 PM 2.80 (H) 0.44 - 1.00 mg/dL Final  07/18/2018 12:54 PM 3.06 (H) 0.44 - 1.00 mg/dL Final  05/03/2018 11:12 AM 2.86 (H) 0.44 - 1.00 mg/dL Final  03/21/2018 11:44 AM 2.72 (H) 0.44 - 1.00 mg/dL Final  03/14/2018 10:33 AM 2.56 (H) 0.44 - 1.00 mg/dL Final  12/07/2017 12:33 PM 2.40 (H) 0.44 - 1.00 mg/dL Final  11/27/2017 06:47  AM 2.52 (H) 0.44 - 1.00 mg/dL Final  11/26/2017 06:19 AM 2.40 (H) 0.44 - 1.00 mg/dL Final  11/25/2017 05:38 AM 2.23 (H) 0.44 - 1.00 mg/dL Final  11/24/2017 06:05 AM 2.19 (H) 0.44 - 1.00 mg/dL Final  11/23/2017 08:21 AM 2.29 (H) 0.44 - 1.00 mg/dL Final  11/17/2017 12:48 PM 2.45 (H) 0.44 - 1.00 mg/dL Final  04/09/2016 06:53 PM 1.79 (H) 0.44 - 1.00 mg/dL Final  04/09/2016 01:44 AM 2.12 (H) 0.44 - 1.00 mg/dL Final  04/08/2016 11:02 AM 1.95 (H) 0.44 - 1.00 mg/dL Final  04/06/2016 03:09 AM 2.04 (H) 0.44 - 1.00 mg/dL Final  04/05/2016 09:29 AM 2.07 (H) 0.44 - 1.00 mg/dL Final  04/04/2016 02:37 PM 2.00 (H) 0.44 - 1.00 mg/dL Final  03/10/2016 09:37 PM 2.71 (H) 0.44 - 1.00 mg/dL Final    Comment:    DELTA CHECK NOTED  03/10/2016 06:26 AM 3.94 (H) 0.44 - 1.00 mg/dL Final  03/09/2016 05:16 PM 4.39 (H) 0.44 - 1.00 mg/dL Final  03/09/2016 05:28 AM 4.42 (H) 0.44 - 1.00 mg/dL Final  02/08/2014 03:42 AM 1.95 (H) 0.50 - 1.10 mg/dL Final  02/07/2014 06:08 AM 1.92 (H) 0.50 - 1.10 mg/dL Final  02/05/2014 05:35 PM 1.65 (H) 0.50 - 1.10 mg/dL Final  12/07/2013 03:30 AM 1.75 (H) 0.50 - 1.10 mg/dL Final  12/06/2013 02:44 AM 1.64 (H) 0.50 - 1.10 mg/dL Final  12/05/2013 11:06 PM 1.70 (H) 0.50 - 1.10 mg/dL Final  12/05/2013 03:27 PM 1.63 (H) 0.50 - 1.10 mg/dL Final  10/16/2007 08:52 PM 0.99 0.40 - 1.20 mg/dL Final  05/17/2007 08:26 PM 0.81 0.40 - 1.20 mg/dL Final  03/27/2007 09:36 PM 0.85 0.40 - 1.20 mg/dL Final   Recent Labs  Lab 02/17/21 1420  NA 141  K 3.3*  CL 95*  CO2 32  GLUCOSE 205*  BUN 22  CREATININE 4.42*  CALCIUM 8.1*   Recent Labs  Lab 02/17/21 1420  WBC 13.7*  HGB 11.9*  HCT 37.2  MCV 96.9  PLT 300   Liver Function Tests: No results for input(s): AST, ALT, ALKPHOS, BILITOT, PROT, ALBUMIN in the last 168 hours. No results for input(s): LIPASE, AMYLASE in the last 168 hours. No results for input(s): AMMONIA in the last 168 hours. Cardiac Enzymes: No results for input(s): CKTOTAL,  CKMB, CKMBINDEX, TROPONINI in the last 168 hours. Iron Studies: No results for input(s): IRON, TIBC, TRANSFERRIN, FERRITIN in the last 72 hours. PT/INR: '@LABRCNTIP' (inr:5)  Xrays/Other Studies: ) Results for orders placed or performed during the hospital encounter of 02/17/21 (from the past 48 hour(s))  Basic metabolic panel     Status: Abnormal   Collection Time: 02/17/21  2:20 PM  Result Value Ref Range   Sodium 141 135 - 145 mmol/L   Potassium 3.3 (L) 3.5 - 5.1 mmol/L   Chloride 95 (L) 98 -  111 mmol/L   CO2 32 22 - 32 mmol/L   Glucose, Bld 205 (H) 70 - 99 mg/dL    Comment: Glucose reference range applies only to samples taken after fasting for at least 8 hours.   BUN 22 8 - 23 mg/dL   Creatinine, Ser 4.42 (H) 0.44 - 1.00 mg/dL   Calcium 8.1 (L) 8.9 - 10.3 mg/dL   GFR, Estimated 10 (L) >60 mL/min    Comment: (NOTE) Calculated using the CKD-EPI Creatinine Equation (2021)    Anion gap 14 5 - 15    Comment: Performed at [facility] [address] [place], [place] [address]  CBC     Status: Abnormal   Collection Time: 02/17/21  2:20 PM  Result Value Ref Range   WBC 13.7 (H) 4.0 - 10.5 K/uL   RBC 3.84 (L) 3.87 - 5.11 MIL/uL   Hemoglobin 11.9 (L) 12.0 - 15.0 g/dL   HCT 37.2 36.0 - 46.0 %   MCV 96.9 80.0 - 100.0 fL   MCH 31.0 26.0 - 34.0 pg   MCHC 32.0 30.0 - 36.0 g/dL   RDW 13.5 11.5 - 15.5 %   Platelets 300 150 - 400 K/uL   nRBC 0.0 0.0 - 0.2 %    Comment: Performed at [facility], [place] [address] [place], [place] [address]  Troponin I (High Sensitivity)     Status: Abnormal   Collection Time: 02/17/21  2:20 PM  Result Value Ref Range   Troponin I (High Sensitivity) 64 (H) <18 ng/L    Comment: (NOTE) Elevated high sensitivity troponin I (hsTnI) values and significant  changes across serial measurements may suggest ACS but many other  chronic and acute conditions are known to elevate hsTnI results.  Refer to the "Links" section for chest pain algorithms and  additional  guidance. Performed at [facility], [place] [address], [place], [place] [address]   Troponin I (High Sensitivity)     Status: Abnormal   Collection Time: 02/17/21  4:40 PM  Result Value Ref Range   Troponin I (High Sensitivity) 67 (H) <18 ng/L    Comment: (NOTE) Elevated high sensitivity troponin I (hsTnI) values and significant  changes across serial measurements may suggest ACS but many other  chronic and acute conditions are known to elevate hsTnI results.  Refer to the "Links" section for chest pain algorithms and additional  guidance. Performed at [facility], [place] [address], [place], [place] [address]    DG Chest Portable 1 View  Result Date: 02/17/2021 CLINICAL DATA:  Shortness of breath, AFib EXAM: PORTABLE CHEST 1 VIEW COMPARISON:  Chest radiograph 06/08/2020 FINDINGS: The heart is enlarged, unchanged. The upper mediastinal contours are within normal limits. There is vascular congestion and probable mild pulmonary interstitial edema. There are trace bilateral pleural effusions. There is no pneumothorax There is no acute osseous abnormality. IMPRESSION: Cardiomegaly with mild pulmonary interstitial edema and trace bilateral pleural effusions suggesting CHF. Electronically Signed   By: Valetta Mole M.D.   On: 02/17/2021 15:05    PMH:   Past Medical History:  Diagnosis Date   Arthritis    Asthma    Atrial fibrillation (HCC)    CHF (congestive heart failure) (HCC)    CKD (chronic kidney disease), stage III (HCC)    COPD (chronic obstructive pulmonary disease) (HCC)    Diabetes mellitus    INSULIN DEPENDENT   Gout    Heart murmur    no  issues per pt   History of kidney stones    Hyperlipemia    Hypertension    Psoriasis    Renal disorder    congenital   Single kidney    Sleep apnea    doesn't use the Cpap    PSH:   Past Surgical History:  Procedure Laterality Date   A/V FISTULAGRAM Left 10/31/2020   Procedure: A/V FISTULAGRAM;  Surgeon:  Angelia Mould, MD;  Location: [facility];  Service: Cardiovascular;  Laterality: Left;   ABDOMINAL HYSTERECTOMY     AV FISTULA PLACEMENT Left 11/17/2018   Procedure: BRACHIO-CEPHALIC ARTERIOVENOUS (AV) FISTULA CREATION IN LEFT ARM;  Surgeon: Marty Heck, MD;  Location: [facility];  Service: Vascular;  Laterality: Left;   CHOLECYSTECTOMY     INSERTION OF DIALYSIS CATHETER Right 01/26/2019   Procedure: INSERTION OF DIALYSIS CATHETER, right internal jugular;  Surgeon: Angelia Mould, MD;  Location: [facility];  Service: Vascular;  Laterality: Right;   LEFT AND RIGHT HEART CATHETERIZATION WITH CORONARY ANGIOGRAM N/A 11/29/2013   Procedure: LEFT AND RIGHT HEART CATHETERIZATION WITH CORONARY ANGIOGRAM;  Surgeon: Jacolyn Reedy, MD;  Location: [facility];  Service: Cardiovascular;  Laterality: N/A;   PERIPHERAL VASCULAR INTERVENTION Left 10/31/2020   Procedure: PERIPHERAL VASCULAR INTERVENTION;  Surgeon: Angelia Mould, MD;  Location: [facility];  Service: Cardiovascular;  Laterality: Left;   RIGHT HEART CATH N/A 11/23/2017   Procedure: RIGHT HEART CATH;  Surgeon: Larey Dresser, MD;  Location: [facility];  Service: Cardiovascular;  Laterality: N/A;   RIGHT/LEFT HEART CATH AND CORONARY ANGIOGRAPHY N/A 06/15/2019   Procedure: RIGHT/LEFT HEART CATH AND CORONARY ANGIOGRAPHY;  Surgeon: Larey Dresser, MD;  Location: [facility];  Service: Cardiovascular;  Laterality: N/A;    Allergies:  Allergies  Allergen Reactions   Eggs Or Egg-Derived Products Nausea And Vomiting   Lisinopril Nausea And Vomiting   Penicillins Nausea And Vomiting    Has patient had a PCN reaction causing immediate rash, facial/tongue/throat swelling, SOB or lightheadedness with hypotension: No Has patient had a PCN reaction causing severe rash involving mucus membranes or skin necrosis: No Has patient had a PCN reaction that required hospitalization: No Has patient had a PCN  reaction occurring within the last 10 years: No If all of the above answers are "NO", then may proceed with Cephalosporin use.    Other Other (See Comments)    Medications:   Prior to Admission medications   Medication Sig Start Date End Date Taking? Authorizing Provider  acetaminophen (TYLENOL) 500 MG tablet Take 500 mg by mouth every 6 (six) hours as needed (pain).   Yes [provider]  albuterol (PROVENTIL HFA;VENTOLIN HFA) 108 (90 BASE) MCG/ACT inhaler Inhale 2 puffs into the lungs every 4 (four) hours as needed for shortness of breath.   Yes [provider]  atorvastatin (LIPITOR) 80 MG tablet Take 80 mg by mouth every evening.    Yes [provider]  B Complex-C-Zn-Folic Acid (DIALYVITE 811-BJYN 15) 0.8 MG TABS Take 1 tablet by mouth in the morning. 03/07/19  Yes [provider]  Blood Glucose Monitoring Suppl (FREESTYLE LITE) DEVI Use as instructed to check blood sugar 4 times daily Patient taking differently: 1 each by Other route See admin instructions. Use as instructed to check blood sugar 4 times daily 12/29/20  Yes Shamleffer, Melanie Crazier, MD  budesonide-formoterol (SYMBICORT) 160-4.5 MCG/ACT inhaler Inhale 2 puffs into the lungs 2 (two) times  daily. 04/09/20  Yes Byrum, Rose Fillers, MD  Continuous Blood Gluc Receiver (FREESTYLE LIBRE READER) DEVI 1 kit by Does not apply route daily. Use as instructed E11.65 09/12/19  Yes Shamleffer, Melanie Crazier, MD  ELIQUIS 5 MG TABS tablet Take 1 tablet by mouth twice daily Patient taking differently: Take 5 mg by mouth 2 (two) times daily. 07/23/20  Yes Larey Dresser, MD  glucose blood (FREESTYLE LITE) test strip USE AS DIRECTED 4 TIMES DAILY Patient taking differently: 1 each by Other route See admin instructions. USE AS DIRECTED 4 TIMES DAILY 02/02/21  Yes Shamleffer, Melanie Crazier, MD  hydrALAZINE (APRESOLINE) 100 MG tablet TAKE 1 TABLET BY MOUTH THREE TIMES DAILY ** PLEASE SCHEDULE AN APPOINTMENT FOR  FUTURE REFILLS** Patient taking differently: Take 50 mg by mouth 2 (two) times daily. 02/10/21  Yes Larey Dresser, MD  insulin aspart (NOVOLOG FLEXPEN) 100 UNIT/ML FlexPen Inject 10 Units into the skin 2 (two) times daily with breakfast and lunch AND 12 Units daily with supper. Patient taking differently: Inject 10 Units into the skin 2 (two) times daily with breakfast and lunch and 12 units with supper. 02/06/20  Yes Shamleffer, Melanie Crazier, MD  insulin degludec (TRESIBA FLEXTOUCH) 100 UNIT/ML FlexTouch Pen Inject 18 Units into the skin daily. 02/06/20  Yes Shamleffer, Melanie Crazier, MD  Insulin Pen Needle 32G X 4 MM MISC 1 Device by Does not apply route in the morning, at noon, in the evening, and at bedtime. 02/06/20  Yes Shamleffer, Melanie Crazier, MD  isosorbide mononitrate (IMDUR) 30 MG 24 hr tablet Take 1 tablet by mouth once daily Patient taking differently: Take 30 mg by mouth daily. 09/10/20  Yes Larey Dresser, MD  ketoconazole (NIZORAL) 2 % cream Apply 1 application topically every morning. 02/16/21  Yes [provider]  ketoconazole (NIZORAL) 2 % shampoo Apply 1 application topically See admin instructions. 2-3 times weekly 02/16/21  Yes [provider]  latanoprost (XALATAN) 0.005 % ophthalmic solution Place 1 drop into both eyes at bedtime.   Yes [provider]  lidocaine (LIDODERM) 5 % Place 1 patch onto the skin daily. 11/12/20  Yes [provider]  lidocaine (LINDAMANTLE) 3 % CREA cream Apply 1 application topically as needed (Before dialysis on Tuesdays, Thursday, and Saturday.). 03/06/20  Yes [provider]  metoprolol succinate (TOPROL-XL) 25 MG 24 hr tablet TAKE 2 TABLETS BY MOUTH ONCE DAILY IN THE MORNING AND THEN TAKE 1 TABLET BY MOUTH ONCE DAILY IN THE EVENING Patient taking differently: Take 25-50 mg by mouth See admin instructions. TAKE 2 TABLETS BY MOUTH ONCE DAILY IN THE MORNING AND THEN TAKE 1 TABLET BY MOUTH ONCE  DAILY IN THE EVENING 02/13/21  Yes Larey Dresser, MD  mirtazapine (REMERON) 15 MG tablet Take 15 mg by mouth at bedtime.   Yes [provider]  ondansetron (ZOFRAN) 4 MG tablet Take 4 mg by mouth 2 (two) times daily as needed for nausea.  10/26/18  Yes [provider]  pantoprazole (PROTONIX) 40 MG tablet Take 40 mg by mouth every evening.  10/26/18  Yes [provider]  sucroferric oxyhydroxide (VELPHORO) 500 MG chewable tablet Chew 500 mg by mouth See admin instructions. Crush or chew or swallow 1 tablet three times a day with meals and 1 tablet two times a day with snacks.   Yes [provider]  NARCAN 4 MG/0.1ML LIQD nasal spray kit Place 1 spray into the nose See admin instructions. Call EMS at first  sign of opioid overdose. Place on spray in nose.  If no response, then place one spray ing the opposite side of the nose. 01/02/19   [provider]  traMADol (ULTRAM) 50 MG tablet Take 50 mg by mouth every 6 (six) hours as needed for severe pain (for pain.).  Patient not taking: Reported on 02/17/2021    [provider]    Discontinued Meds:   Medications Discontinued During This Encounter  Medication Reason   fentaNYL (SUBLIMAZE) injection 50 mcg     Social History:  reports that she quit smoking about 11 years ago. Her smoking use included cigarettes. She has a 20.00 pack-year smoking history. She has never used smokeless tobacco. She reports current alcohol use. She reports that she does not currently use drugs.  Family History:   Family History  Problem Relation Age of Onset   Lupus Daughter    Cancer Mother 56       type unknown   CVA Father 25   Cancer Brother 30       type unknown    Blood pressure (!) 115/51, pulse 75, temperature 98.8 F (37.1 C), temperature source Oral, resp. rate 14, height '5\' 2"'  (1.575 m), weight 89.8 kg, SpO2 100 %. General appearance: alert, cooperative, and appears stated age Head: Normocephalic,  without obvious abnormality, atraumatic Eyes: negative Neck: no adenopathy, no carotid bruit, no JVD, supple, symmetrical, trachea midline, and thyroid not enlarged, symmetric, no tenderness/mass/nodules Back: symmetric, no curvature. ROM normal. No CVA tenderness. Chest wall: no tenderness Cardio: regularly irregular rhythm GI: soft, non-tender; bowel sounds normal; no masses,  no organomegaly Extremities: edema trace Pulses: 2+ and symmetric Access: lt BCF  great thrill       Dwana Melena, MD 02/17/2021, 7:42 PM

## 2021-02-18 DIAGNOSIS — J449 Chronic obstructive pulmonary disease, unspecified: Secondary | ICD-10-CM | POA: Diagnosis not present

## 2021-02-18 DIAGNOSIS — E1122 Type 2 diabetes mellitus with diabetic chronic kidney disease: Secondary | ICD-10-CM | POA: Diagnosis not present

## 2021-02-18 DIAGNOSIS — I5033 Acute on chronic diastolic (congestive) heart failure: Secondary | ICD-10-CM | POA: Diagnosis not present

## 2021-02-18 DIAGNOSIS — Z992 Dependence on renal dialysis: Secondary | ICD-10-CM | POA: Diagnosis not present

## 2021-02-18 DIAGNOSIS — N186 End stage renal disease: Secondary | ICD-10-CM | POA: Diagnosis not present

## 2021-02-18 DIAGNOSIS — I480 Paroxysmal atrial fibrillation: Secondary | ICD-10-CM | POA: Diagnosis not present

## 2021-02-18 LAB — CBC
HCT: 32.9 % — ABNORMAL LOW (ref 36.0–46.0)
Hemoglobin: 10.1 g/dL — ABNORMAL LOW (ref 12.0–15.0)
MCH: 30.8 pg (ref 26.0–34.0)
MCHC: 30.7 g/dL (ref 30.0–36.0)
MCV: 100.3 fL — ABNORMAL HIGH (ref 80.0–100.0)
Platelets: 288 10*3/uL (ref 150–400)
RBC: 3.28 MIL/uL — ABNORMAL LOW (ref 3.87–5.11)
RDW: 13.7 % (ref 11.5–15.5)
WBC: 11.5 10*3/uL — ABNORMAL HIGH (ref 4.0–10.5)
nRBC: 0 % (ref 0.0–0.2)

## 2021-02-18 LAB — HEPATITIS B SURFACE ANTIGEN
Hepatitis B Surface Ag: NONREACTIVE
Hepatitis B Surface Ag: NONREACTIVE

## 2021-02-18 LAB — HEPATIC FUNCTION PANEL
ALT: 18 U/L (ref 0–44)
AST: 18 U/L (ref 15–41)
Albumin: 2.9 g/dL — ABNORMAL LOW (ref 3.5–5.0)
Alkaline Phosphatase: 98 U/L (ref 38–126)
Bilirubin, Direct: 0.1 mg/dL (ref 0.0–0.2)
Indirect Bilirubin: 0.4 mg/dL (ref 0.3–0.9)
Total Bilirubin: 0.5 mg/dL (ref 0.3–1.2)
Total Protein: 7.2 g/dL (ref 6.5–8.1)

## 2021-02-18 LAB — BASIC METABOLIC PANEL
Anion gap: 11 (ref 5–15)
BUN: 26 mg/dL — ABNORMAL HIGH (ref 8–23)
CO2: 33 mmol/L — ABNORMAL HIGH (ref 22–32)
Calcium: 8.1 mg/dL — ABNORMAL LOW (ref 8.9–10.3)
Chloride: 96 mmol/L — ABNORMAL LOW (ref 98–111)
Creatinine, Ser: 5.77 mg/dL — ABNORMAL HIGH (ref 0.44–1.00)
GFR, Estimated: 7 mL/min — ABNORMAL LOW (ref >60)
Glucose, Bld: 187 mg/dL — ABNORMAL HIGH (ref 70–99)
Potassium: 3.6 mmol/L (ref 3.5–5.1)
Sodium: 140 mmol/L (ref 135–145)

## 2021-02-18 LAB — CBG MONITORING, ED: Glucose-Capillary: 131 mg/dL — ABNORMAL HIGH (ref 70–99)

## 2021-02-18 LAB — HEPATITIS B CORE ANTIBODY, TOTAL: Hep B Core Total Ab: NONREACTIVE

## 2021-02-18 LAB — TSH: TSH: 1.546 u[IU]/mL (ref 0.350–4.500)

## 2021-02-18 LAB — HEMOGLOBIN A1C
Hgb A1c MFr Bld: 9.1 % — ABNORMAL HIGH (ref 4.8–5.6)
Mean Plasma Glucose: 214.47 mg/dL

## 2021-02-18 LAB — GLUCOSE, CAPILLARY
Glucose-Capillary: 118 mg/dL — ABNORMAL HIGH (ref 70–99)
Glucose-Capillary: 239 mg/dL — ABNORMAL HIGH (ref 70–99)

## 2021-02-18 LAB — HEPATITIS B SURFACE ANTIBODY,QUALITATIVE: Hep B S Ab: REACTIVE — AB

## 2021-02-18 MED ORDER — LIDOCAINE HCL (PF) 1 % IJ SOLN: 5.0000 mL | INTRAMUSCULAR | Status: DC | PRN

## 2021-02-18 MED ORDER — METOPROLOL SUCCINATE ER 25 MG PO TB24
25.0000 mg | ORAL_TABLET | Freq: Two times a day (BID) | ORAL | Status: DC
  Administered 2021-02-18: 17:00:00 25 mg via ORAL
  Filled 2021-02-18: qty 1

## 2021-02-18 MED ORDER — AMIODARONE HCL 200 MG PO TABS
200.0000 mg | ORAL_TABLET | Freq: Two times a day (BID) | ORAL | Status: DC
Start: 2021-02-18 — End: 2021-02-19
  Administered 2021-02-18: 23:00:00 200 mg via ORAL
  Filled 2021-02-18: qty 1

## 2021-02-18 MED ORDER — PENTAFLUOROPROP-TETRAFLUOROETH EX AERO: 1.0000 "application " | INHALATION_SPRAY | CUTANEOUS | Status: DC | PRN

## 2021-02-18 MED ORDER — SODIUM CHLORIDE 0.9 % IV SOLN: 100.0000 mL | INTRAVENOUS | Status: DC | PRN

## 2021-02-18 MED ORDER — HEPARIN SODIUM (PORCINE) 1000 UNIT/ML DIALYSIS: 1000.0000 [IU] | INTRAMUSCULAR | Status: DC | PRN

## 2021-02-18 MED ORDER — METOPROLOL SUCCINATE ER 25 MG PO TB24: 25.0000 mg | ORAL_TABLET | Freq: Every evening | ORAL | Status: DC

## 2021-02-18 MED ORDER — ALTEPLASE 2 MG IJ SOLR: 2.0000 mg | Freq: Once | INTRAMUSCULAR | Status: DC | PRN

## 2021-02-18 MED ORDER — LIDOCAINE-PRILOCAINE 2.5-2.5 % EX CREA: 1.0000 "application " | TOPICAL_CREAM | CUTANEOUS | Status: DC | PRN

## 2021-02-18 MED ORDER — METOPROLOL SUCCINATE ER 50 MG PO TB24
50.0000 mg | ORAL_TABLET | Freq: Every day | ORAL | Status: DC
Start: 2021-02-19 — End: 2021-02-18

## 2021-02-18 NOTE — Progress Notes (Addendum)
Advanced Heart Failure Rounding Note  PCP-Cardiologist: Ezzard Standing, MD   Subjective:     Reports she felt weak and had low BP during dialysis.   BP okay this afternoon.  SR with PACs on telemetry   Objective:   Weight Range: 89.8 kg Body mass index is 35.07 kg/m.   Vital Signs:   Temp:  [97 F (36.1 C)-98.8 F (37.1 C)] 97.9 F (36.6 C) (12/28 1431) Pulse Rate:  [47-143] 88 (12/28 1431) Resp:  [8-37] 18 (12/28 1431) BP: (80-130)/(40-93) 119/52 (12/28 1431) SpO2:  [91 %-100 %] 100 % (12/28 1431)    Weight change: Filed Weights   02/17/21 1407  Weight: 89.8 kg    Intake/Output:   Intake/Output Summary (Last 24 hours) at 02/18/2021 1436 Last data filed at 02/17/2021 1744 Gross per 24 hour  Intake 15.4 ml  Output --  Net 15.4 ml      Physical Exam    General:  Sitting up in bed. No distress. HEENT: Normal Neck: Supple. No JVD. Carotids 2+ bilat; no bruits. No lymphadenopathy or thyromegaly appreciated. Cor: PMI nondisplaced. Regular rate & rhythm. No rubs, gallops or murmurs. Lungs: Clear Abdomen: Soft, nontender, nondistended. No hepatosplenomegaly. No bruits or masses. Good bowel sounds. Extremities: No cyanosis, clubbing, rash, edema Neuro: Alert & orientedx3, cranial nerves grossly intact. moves all 4 extremities w/o difficulty. Affect pleasant   Telemetry   SR 60s-70s, PACs  Labs    CBC Recent Labs    02/17/21 1420 02/18/21 0516  WBC 13.7* 11.5*  HGB 11.9* 10.1*  HCT 37.2 32.9*  MCV 96.9 100.3*  PLT 300 889   Basic Metabolic Panel Recent Labs    02/17/21 1420 02/18/21 0516  NA 141 140  K 3.3* 3.6  CL 95* 96*  CO2 32 33*  GLUCOSE 205* 187*  BUN 22 26*  CREATININE 4.42* 5.77*  CALCIUM 8.1* 8.1*   Liver Function Tests No results for input(s): AST, ALT, ALKPHOS, BILITOT, PROT, ALBUMIN in the last 72 hours. No results for input(s): LIPASE, AMYLASE in the last 72 hours. Cardiac Enzymes No results for input(s):  CKTOTAL, CKMB, CKMBINDEX, TROPONINI in the last 72 hours.  BNP: BNP (last 3 results) Recent Labs    06/08/20 1011  BNP 402.0*    ProBNP (last 3 results) No results for input(s): PROBNP in the last 8760 hours.   D-Dimer No results for input(s): DDIMER in the last 72 hours. Hemoglobin A1C Recent Labs    02/18/21 0516  HGBA1C 9.1*   Fasting Lipid Panel No results for input(s): CHOL, HDL, LDLCALC, TRIG, CHOLHDL, LDLDIRECT in the last 72 hours. Thyroid Function Tests No results for input(s): TSH, T4TOTAL, T3FREE, THYROIDAB in the last 72 hours.  Invalid input(s): FREET3  Other results:   Imaging    DG Chest Portable 1 View  Result Date: 02/17/2021 CLINICAL DATA:  Shortness of breath, AFib EXAM: PORTABLE CHEST 1 VIEW COMPARISON:  Chest radiograph 06/08/2020 FINDINGS: The heart is enlarged, unchanged. The upper mediastinal contours are within normal limits. There is vascular congestion and probable mild pulmonary interstitial edema. There are trace bilateral pleural effusions. There is no pneumothorax There is no acute osseous abnormality. IMPRESSION: Cardiomegaly with mild pulmonary interstitial edema and trace bilateral pleural effusions suggesting CHF. Electronically Signed   By: Valetta Mole M.D.   On: 02/17/2021 15:05     Medications:     Scheduled Medications:  apixaban  5 mg Oral BID   atorvastatin  80 mg Oral  QPM   Chlorhexidine Gluconate Cloth  6 each Topical Q0600   insulin aspart  0-5 Units Subcutaneous QHS   insulin aspart  0-6 Units Subcutaneous TID WC   latanoprost  1 drop Both Eyes QHS   metoprolol tartrate  25 mg Oral Q6H   mirtazapine  15 mg Oral QHS   mometasone-formoterol  2 puff Inhalation BID   pantoprazole  40 mg Oral QPM   sodium chloride flush  3 mL Intravenous Q12H    Infusions:   PRN Medications: acetaminophen **OR** acetaminophen, albuterol, ondansetron **OR** ondansetron (ZOFRAN) IV, senna-docusate   Assessment/Plan   Atrial  fibrillation:  -Known hx paroxysmal AF -Seen in ED at [facility] last week for AF and spontaneously converted to SR -Presented 02/17/21 with AF with RVR. Cardioverted in ER.  -Maintaining SR today with rates 60s -70s -Start amiodarone 200 mg BID X 2 weeks then 200 mg daily -Check baseline LFTs and TSH. PFTs 2019 with restrictive defect, assessment of DLCO technically limited. No convincing ILD on CT chest 10/19 -Switch metoprolol tartrate back to home dose (50 mg q am and 25 mg q pm) -Continue eliquis  2. 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  [place] 11/23/17 showed significantly elevated right and left heart filling pressures and severe primarily pulmonary venous hypertension. Cardiac output preserved. Echo (10/19) with EF 45-50%, septal-lateral dyssynchrony, RV looked ok.  PYP scan was not suggestive of transthyretin amyloidosis.  Echo in 1/21 with EF 55-60%, normal RV.  RHC/LHC in 4/21 with no significant CAD, normal filling pressures and cardiac output.   -NYHA Class III, chronic, and confounded by COPD and obesity.  -Volume up on admit with evidence of CHF on chest x-ray. Likely d/t AF with RVR. -Had HD session today. Seen post HD and volume appeared okay 3. COPD: She no longer smokes. Restrictive PFTs. High rest CT negative for ILD. Suspect restrictive PFTs are related to body habitus and contributing to her chronic exertional dyspnea  4. HTN: BP controlled today and often low with HD.  Imdur/hydralazine on hold d/t hypotension on admit 5. ESRD: HD per renal. Followed by Dr. Hollie Salk as outpatient 6. Pulmonary HTN: Mild PH on 4/21 RHC.  7. Hyperlipidemia: on atorvastatin 80.  8. DM II: A1c 9.1 -Per primary team  Will arrange for follow-up in HF clinic in next few weeks.   Patient and her daughter are concerned  about need for home O2 and HH.  Placed TOC consult and PT/OT eval at their request.  Length of Stay: 0  FINCH, LINDSAY N, PA-C  02/18/2021, 2:36 PM  Advanced Heart Failure Team Pager [phone] (M-F; 7a - 5p)  Please contact [facility] for night-coverage after hours (5p -7a ) and weekends on [email or web address]  Patient seen with PA, agree with the above note.   She has had 2 episodes now recently of afib/RVR.   Now back in NSR.  Had HD today, feels weak.   General: NAD Neck: No JVD, no thyromegaly or thyroid nodule.  Lungs: Clear to auscultation bilaterally with normal respiratory effort. CV: Nondisplaced PMI.  Heart regular S1/S2, no S3/S4, no murmur.  No peripheral edema.   Abdomen: Soft, nontender, no hepatosplenomegaly, no distention.  Skin: Intact without lesions or rashes.  Neurologic: Alert and oriented x 3.  Psych: Normal affect. Extremities: No clubbing or cyanosis.  HEENT:  Normal.   With 2 recent episodes of AF/RVR, would start amiodarone 200 mg bid x 2 wks then 200 mg daily to try to hold her in NSR.  She can resume Toprol XL, but would use 25 mg bid since we are adding amiodarone. Continue Eliquis.   Will arrange CHF clinic followup.   Loralie Champagne 02/18/2021 4:20 PM

## 2021-02-18 NOTE — ED Notes (Signed)
Per Posey Pronto, MD in regards to pt low BP, "hold metoprolol if SBP less than 95 or HR less than 65."

## 2021-02-18 NOTE — Progress Notes (Signed)
NURSING PROGRESS NOTE  Stephanie Woodward [id number] Admission Data: 02/18/2021 7:57 PM Attending Provider: Charlynne Cousins, MD ESL:[id number], Elby Showers, MD Code Status: Full   Stephanie Woodward is a 80 y.o. female patient admitted from ED:  -No acute distress noted.  -No complaints of shortness of breath.  -No complaints of chest pain.   Cardiac Monitoring:  Cardiac monitor yields:normal sinus rhythm and BBB.  Blood pressure (!) 103/46, pulse 77, temperature 99.9 F (37.7 C), temperature source Oral, resp. rate 18, height 5\' 3"  (1.6 m), weight 88.4 kg, SpO2 100 %.   IV Fluids:  IV in place, occlusive dsg intact without redness, IV cath antecubital right, condition patent and no redness none.   Allergies:  Eggs or egg-derived products, Lisinopril, Penicillins, and Other  Past Medical History:   has a past medical history of Arthritis, Asthma, Atrial fibrillation ([place]), CHF (congestive heart failure) ([place]), CKD (chronic kidney disease), stage III ([place]), COPD (chronic obstructive pulmonary disease) ([place]), Diabetes mellitus, Gout, Heart murmur, History of kidney stones, Hyperlipemia, Hypertension, Psoriasis, Renal disorder, Single kidney, and Sleep apnea.  Past Surgical History:   has a past surgical history that includes left and right heart catheterization with coronary angiogram (N/A, 11/29/2013); Cholecystectomy; Abdominal hysterectomy; RIGHT HEART CATH (N/A, 11/23/2017); AV fistula placement (Left, 11/17/2018); Insertion of dialysis catheter (Right, 01/26/2019); RIGHT/LEFT HEART CATH AND CORONARY ANGIOGRAPHY (N/A, 06/15/2019); A/V Fistulagram (Left, 10/31/2020); and PERIPHERAL VASCULAR INTERVENTION (Left, 10/31/2020).  Social History:   reports that she quit smoking about 11 years ago. Her smoking use included cigarettes. She has a 20.00 pack-year smoking history. She has never used smokeless tobacco. She reports current alcohol use. She reports that she does not currently use drugs.  Skin: Dry.  Clean, and intact  Patient/Family orientated to room. Information packet given to patient/family. Admission inpatient armband information verified with patient/family to include name and date of birth and placed on patient arm. Side rails up x 2, fall assessment and education completed with patient/family. Patient/family able to verbalize understanding of risk associated with falls and verbalized understanding to call for assistance before getting out of bed. Call light within reach. Patient/family able to voice and demonstrate understanding of unit orientation instructions.    Will continue to evaluate and treat per MD orders.

## 2021-02-18 NOTE — ED Notes (Signed)
Pt to dialysis at this time

## 2021-02-18 NOTE — ED Notes (Signed)
MD made aware of Pt BP of 90/40s.

## 2021-02-18 NOTE — Progress Notes (Signed)
Pt receives out-pt HD at [facility] on TTS. Pt arrives at 10:40 for 10:55 chair time. Will assist as needed.  Melven Sartorius Renal Navigator [phone]

## 2021-02-18 NOTE — Progress Notes (Signed)
[facility] Progress Note   Subjective:    Seen and examined patient on HD unit. Patient completed HD and tolerated net UF 1.3L. Informed by HD Tech of low Bps during treatment. Patient continues to feel weak. Denies SOB, CP, palpitations, and N/V. Plan for HD 02/20/21  Objective Vitals:   02/18/21 1011 02/18/21 1030 02/18/21 1100 02/18/21 1130  BP: 103/65 (!) 111/57 (!) 116/55 108/81  Pulse: 70 69 69 69  Resp: 14 (!) 32 15 16  Temp:      TempSrc:      SpO2:      Weight:      Height:       Physical Exam General: Older female; NAD Heart: Irregular; No murmurs, gallops, or rubs Lungs: Clear anteriorly; diminished laterally Abdomen: Large, soft, non-tender Extremities: No edema BLLE Dialysis Access: L BCF   Filed Weights   02/17/21 1407  Weight: 89.8 kg    Intake/Output Summary (Last 24 hours) at 02/18/2021 1341 Last data filed at 02/17/2021 1744 Gross per 24 hour  Intake 15.4 ml  Output --  Net 15.4 ml    Additional Objective Labs: Basic Metabolic Panel: Recent Labs  Lab 02/17/21 1420 02/18/21 0516  NA 141 140  K 3.3* 3.6  CL 95* 96*  CO2 32 33*  GLUCOSE 205* 187*  BUN 22 26*  CREATININE 4.42* 5.77*  CALCIUM 8.1* 8.1*   Liver Function Tests: No results for input(s): AST, ALT, ALKPHOS, BILITOT, PROT, ALBUMIN in the last 168 hours. No results for input(s): LIPASE, AMYLASE in the last 168 hours. CBC: Recent Labs  Lab 02/17/21 1420 02/18/21 0516  WBC 13.7* 11.5*  HGB 11.9* 10.1*  HCT 37.2 32.9*  MCV 96.9 100.3*  PLT 300 288   Blood Culture    Component Value Date/Time   SDES URINE, RANDOM 03/09/2016 0648   SPECREQUEST NONE 03/09/2016 0648   CULT NO GROWTH 03/09/2016 0648   REPTSTATUS 03/10/2016 FINAL 03/09/2016 0648    Cardiac Enzymes: No results for input(s): CKTOTAL, CKMB, CKMBINDEX, TROPONINI in the last 168 hours. CBG: Recent Labs  Lab 02/17/21 2215 02/18/21 0744  GLUCAP 175* 131*   Iron Studies: No results for  input(s): IRON, TIBC, TRANSFERRIN, FERRITIN in the last 72 hours. Lab Results  Component Value Date   INR 1.1 11/17/2018   Studies/Results: DG Chest Portable 1 View  Result Date: 02/17/2021 CLINICAL DATA:  Shortness of breath, AFib EXAM: PORTABLE CHEST 1 VIEW COMPARISON:  Chest radiograph 06/08/2020 FINDINGS: The heart is enlarged, unchanged. The upper mediastinal contours are within normal limits. There is vascular congestion and probable mild pulmonary interstitial edema. There are trace bilateral pleural effusions. There is no pneumothorax There is no acute osseous abnormality. IMPRESSION: Cardiomegaly with mild pulmonary interstitial edema and trace bilateral pleural effusions suggesting CHF. Electronically Signed   By: Valetta Mole M.D.   On: 02/17/2021 15:05    Medications:   apixaban  5 mg Oral BID   atorvastatin  80 mg Oral QPM   Chlorhexidine Gluconate Cloth  6 each Topical Q0600   insulin aspart  0-5 Units Subcutaneous QHS   insulin aspart  0-6 Units Subcutaneous TID WC   latanoprost  1 drop Both Eyes QHS   metoprolol tartrate  25 mg Oral Q6H   mirtazapine  15 mg Oral QHS   mometasone-formoterol  2 puff Inhalation BID   pantoprazole  40 mg Oral QPM   sodium chloride flush  3 mL Intravenous Q12H    Dialysis Orders:  [place]  TueThuSat, 4 hrs 0 min, 180NRe Optiflux, BFR 350, DFR Manual 500 mL/min, EDW 87.2 (kg), Dialysate 2.0 K, 2.0 Ca, 1.0 Mg, 100 Dextrose (G2201), Sodium 137 (mEq/L), Bicarb Setting: 35 (mEq/L), UFR Profile: Profile 2, Sodium Model: None Hectorol 25mcg IV qtx, Venofer 50 qwk, has not req Mircera  Assessment/Plan: 1. Paroxysmal Afib-similar presentation at outside hospital in [place]-cardioverted in ED overnight. On Eliquis and Metoprolol 2. ESRD -tolerated net UF 1.3L during today's HD. Informed about low Bps during HD. SBP in 120s now. Monitor trends 3. Anemia of CKD- Hgb 10.1-monitor trends for now. 4. Secondary hyperparathyroidism - Will order PO4 in AM-labs  ordered 5. HTN/volume - Bps soft but stable; doesn't appear to be volume overloaded 6. Nutrition - Renal diet with fluid restriction  Tobie Poet, NP [facility] 02/18/2021,1:41 PM  LOS: 0 days

## 2021-02-18 NOTE — Plan of Care (Signed)
  Problem: Education: Goal: Ability to verbalize understanding of medication therapies will improve Outcome: Progressing   Problem: Cardiac: Goal: Ability to achieve and maintain adequate cardiopulmonary perfusion will improve Outcome: Progressing   

## 2021-02-18 NOTE — Progress Notes (Signed)
[facility] PROGRESS NOTE    Progress Note  Stephanie Woodward  OIN:[id number] DOB: 04-May-1940 DOA: 02/17/2021 PCP: Larey Dresser, MD     Brief Narrative:   Stephanie Woodward is an 80 y.o. female past medical history significant for end-stage renal disease, paroxysmal atrial fibrillation on Eliquis, chronic diastolic heart failure, insulin-dependent diabetes mellitus type 2 who comes into the ED for shortness of breath.  In the ED she was found to be A. fib with RVR with a blood pressure of 91/64 pulse 113.  Started on IV diltiazem infusion, cardiology was consulted recommended cardioversion, post cardioversion EKG shows sinus rhythm with PACs cardiology recommended start Lopressor 25 every 6.  If needed they would add amiodarone if her rhythm cannot be controlled.  Nephrology was consulted.   Assessment/Plan:   Paroxysmal atrial fibrillation with RVR (HCC) Status post cardioversion currently on Eliquis. Cardiology recommended metoprolol 25 mg every 6 hours. Further management per cardiology.  Acute on chronic diastolic heart failure/pulmonary edema: Likely secondary to A. fib with RVR. Nephrology has been notified she is getting dialysis this morning. She is currently saturating greater than 97% on 2 L, wean to room air.  End-stage renal disease on hemodialysis: Unable to complete her last hemodialysis treatment due to A. fib with RVR. Nephrology has been notified.  Insulin-dependent diabetes mellitus type 2: Currently on sliding scale insulin.  With an A1c of 9.1.  COPD Gold Continue inhalers.  Essential hypertension: Holding hydralazine and Imdur due to soft blood pressure, continue metoprolol every 6. Blood pressure is improved since controlling heart rate.  Mild leukocytosis: Likely stress demargination she has remained afebrile leukocytosis improving off antibiotics.  Elevated troponins: In the setting of A. fib with RVR and end-stage renal disease.  She denies  any chest pain.    DVT prophylaxis: eliquis Family Communication:none Status is: Observation  The patient remains OBS appropriate and will d/c before 2 midnights.   Code Status:     Code Status Orders  (From admission, onward)           Start     Ordered   02/17/21 2028  Full code  Continuous        02/17/21 2028           Code Status History     Date Active Date Inactive Code Status Order ID Comments User Context   06/15/2019 1025 06/15/2019 1826 Full Code [id number]  Larey Dresser, MD Inpatient   01/24/2019 1033 01/31/2019 2203 Full Code [id number]  Norval Morton, MD ED   11/23/2017 1046 11/27/2017 1912 Full Code [id number]  Georgiana Shore, NP Inpatient   04/04/2016 1750 04/06/2016 1540 Full Code [id number]  Brenton Grills, PA-C ED   03/09/2016 0847 03/11/2016 1941 Full Code [id number]  Waldemar Dickens, MD Inpatient   02/06/2014 0157 02/08/2014 1655 Full Code [id number]  Rise Patience, MD Inpatient   12/05/2013 2047 12/07/2013 2149 Full Code [id number]  Crista Luria Inpatient   11/29/2013 1114 11/29/2013 1805 Full Code [id number]  Jacolyn Reedy, MD Inpatient         IV Access:   Peripheral IV   Procedures and diagnostic studies:   DG Chest Portable 1 View  Result Date: 02/17/2021 CLINICAL DATA:  Shortness of breath, AFib EXAM: PORTABLE CHEST 1 VIEW COMPARISON:  Chest radiograph 06/08/2020 FINDINGS: The heart is enlarged, unchanged. The upper mediastinal contours are within normal limits. There is vascular congestion and probable mild pulmonary interstitial edema. There are  trace bilateral pleural effusions. There is no pneumothorax There is no acute osseous abnormality. IMPRESSION: Cardiomegaly with mild pulmonary interstitial edema and trace bilateral pleural effusions suggesting CHF. Electronically Signed   By: Valetta Mole M.D.   On: 02/17/2021 15:05     Medical Consultants:   None.   Subjective:    Stephanie Woodward relates her  breathing is significantly better than when she came in.  Objective:    Vitals:   02/18/21 0515 02/18/21 0545 02/18/21 0600 02/18/21 0615  BP: 98/63 (!) 113/54 (!) 102/49 (!) 111/52  Pulse: 60 66 65 66  Resp: 15 13 13 14   Temp:      TempSrc:      SpO2: 100% 99% 99% 100%  Weight:      Height:       SpO2: 100 % O2 Flow Rate (L/min): 2 L/min   Intake/Output Summary (Last 24 hours) at 02/18/2021 0704 Last data filed at 02/17/2021 1744 Gross per 24 hour  Intake 15.4 ml  Output --  Net 15.4 ml   Filed Weights   02/17/21 1407  Weight: 89.8 kg    Exam: General exam: In no acute distress. Respiratory system: Good air movement and clear to auscultation. Cardiovascular system: S1 & S2 heard, RRR. No JVD. Gastrointestinal system: Abdomen is nondistended, soft and nontender.  Extremities: Trace lower extremity edema Skin: No rashes, lesions or ulcers Psychiatry: Judgement and insight appear normal. Mood & affect appropriate.    Data Reviewed:    Labs: Basic Metabolic Panel: Recent Labs  Lab 02/17/21 1420 02/18/21 0516  NA 141 140  K 3.3* 3.6  CL 95* 96*  CO2 32 33*  GLUCOSE 205* 187*  BUN 22 26*  CREATININE 4.42* 5.77*  CALCIUM 8.1* 8.1*   GFR Estimated Creatinine Clearance: 8.4 mL/min (A) (by C-G formula based on SCr of 5.77 mg/dL (H)). Liver Function Tests: No results for input(s): AST, ALT, ALKPHOS, BILITOT, PROT, ALBUMIN in the last 168 hours. No results for input(s): LIPASE, AMYLASE in the last 168 hours. No results for input(s): AMMONIA in the last 168 hours. Coagulation profile No results for input(s): INR, PROTIME in the last 168 hours. COVID-19 Labs  No results for input(s): DDIMER, FERRITIN, LDH, CRP in the last 72 hours.  Lab Results  Component Value Date   SARSCOV2NAA NEGATIVE 02/17/2021   SARSCOV2NAA POSITIVE (A) 03/25/2020   SARSCOV2NAA NEGATIVE 08/03/2019   [place] NEGATIVE 06/12/2019    CBC: Recent Labs  Lab 02/17/21 1420  02/18/21 0516  WBC 13.7* 11.5*  HGB 11.9* 10.1*  HCT 37.2 32.9*  MCV 96.9 100.3*  PLT 300 288   Cardiac Enzymes: No results for input(s): CKTOTAL, CKMB, CKMBINDEX, TROPONINI in the last 168 hours. BNP (last 3 results) No results for input(s): PROBNP in the last 8760 hours. CBG: Recent Labs  Lab 02/17/21 2215  GLUCAP 175*   D-Dimer: No results for input(s): DDIMER in the last 72 hours. Hgb A1c: Recent Labs    02/18/21 0516  HGBA1C 9.1*   Lipid Profile: No results for input(s): CHOL, HDL, LDLCALC, TRIG, CHOLHDL, LDLDIRECT in the last 72 hours. Thyroid function studies: No results for input(s): TSH, T4TOTAL, T3FREE, THYROIDAB in the last 72 hours.  Invalid input(s): FREET3 Anemia work up: No results for input(s): VITAMINB12, FOLATE, FERRITIN, TIBC, IRON, RETICCTPCT in the last 72 hours. Sepsis Labs: Recent Labs  Lab 02/17/21 1420 02/18/21 0516  WBC 13.7* 11.5*   Microbiology Recent Results (from the past 240 hour(s))  Resp Panel  by RT-PCR (Flu A&B, Covid) Nasopharyngeal Swab     Status: None   Collection Time: 02/17/21  7:27 PM   Specimen: Nasopharyngeal Swab; Nasopharyngeal(NP) swabs in vial transport medium  Result Value Ref Range Status   SARS Coronavirus 2 by RT PCR NEGATIVE NEGATIVE Final    Comment: (NOTE) SARS-CoV-2 target nucleic acids are NOT DETECTED.  The SARS-CoV-2 RNA is generally detectable in upper respiratory specimens during the acute phase of infection. The lowest concentration of SARS-CoV-2 viral copies this assay can detect is 138 copies/mL. A negative result does not preclude SARS-Cov-2 infection and should not be used as the sole basis for treatment or other patient management decisions. A negative result may occur with  improper specimen collection/handling, submission of specimen other than nasopharyngeal swab, presence of viral mutation(s) within the areas targeted by this assay, and inadequate number of viral copies(<138 copies/mL). A  negative result must be combined with clinical observations, patient history, and epidemiological information. The expected result is Negative.  Fact Sheet for Patients:  EntrepreneurPulse.com.au  Fact Sheet for Healthcare Providers:  IncredibleEmployment.be  This test is no t yet approved or cleared by the [place] FDA and  has been authorized for detection and/or diagnosis of SARS-CoV-2 by FDA under an Emergency Use Authorization (EUA). This EUA will remain  in effect (meaning this test can be used) for the duration of the COVID-19 declaration under Section 564(b)(1) of the Act, 21 U.S.C.section 360bbb-3(b)(1), unless the authorization is terminated  or revoked sooner.       Influenza A by PCR NEGATIVE NEGATIVE Final   Influenza B by PCR NEGATIVE NEGATIVE Final    Comment: (NOTE) The Xpert Xpress SARS-CoV-2/FLU/RSV plus assay is intended as an aid in the diagnosis of influenza from Nasopharyngeal swab specimens and should not be used as a sole basis for treatment. Nasal washings and aspirates are unacceptable for Xpert Xpress SARS-CoV-2/FLU/RSV testing.  Fact Sheet for Patients: EntrepreneurPulse.com.au  Fact Sheet for Healthcare Providers: IncredibleEmployment.be  This test is not yet approved or cleared by the [place] FDA and has been authorized for detection and/or diagnosis of SARS-CoV-2 by FDA under an Emergency Use Authorization (EUA). This EUA will remain in effect (meaning this test can be used) for the duration of the COVID-19 declaration under Section 564(b)(1) of the Act, 21 U.S.C. section 360bbb-3(b)(1), unless the authorization is terminated or revoked.  Performed at [facility], [place] [address] [place], [place] [address]      Medications:    apixaban  5 mg Oral BID   atorvastatin  80 mg Oral QPM   Chlorhexidine Gluconate Cloth  6 each Topical Q0600   insulin  aspart  0-5 Units Subcutaneous QHS   insulin aspart  0-6 Units Subcutaneous TID WC   latanoprost  1 drop Both Eyes QHS   metoprolol tartrate  25 mg Oral Q6H   mirtazapine  15 mg Oral QHS   mometasone-formoterol  2 puff Inhalation BID   pantoprazole  40 mg Oral QPM   sodium chloride flush  3 mL Intravenous Q12H   Continuous Infusions:    LOS: 0 days   Charlynne Cousins  [facility]  02/18/2021, 7:04 AM

## 2021-02-18 NOTE — Progress Notes (Signed)
Patient and daughters report fine tremors recently. This isn't patient's baseline.

## 2021-02-19 ENCOUNTER — Inpatient Hospital Stay (HOSPITAL_COMMUNITY): Payer: HMO

## 2021-02-19 DIAGNOSIS — E871 Hypo-osmolality and hyponatremia: Secondary | ICD-10-CM | POA: Diagnosis not present

## 2021-02-19 DIAGNOSIS — I4891 Unspecified atrial fibrillation: Secondary | ICD-10-CM

## 2021-02-19 DIAGNOSIS — I5031 Acute diastolic (congestive) heart failure: Secondary | ICD-10-CM | POA: Diagnosis not present

## 2021-02-19 DIAGNOSIS — N183 Chronic kidney disease, stage 3 unspecified: Secondary | ICD-10-CM

## 2021-02-19 DIAGNOSIS — D631 Anemia in chronic kidney disease: Secondary | ICD-10-CM | POA: Diagnosis not present

## 2021-02-19 DIAGNOSIS — Z978 Presence of other specified devices: Secondary | ICD-10-CM | POA: Diagnosis not present

## 2021-02-19 DIAGNOSIS — I5033 Acute on chronic diastolic (congestive) heart failure: Secondary | ICD-10-CM

## 2021-02-19 DIAGNOSIS — Z992 Dependence on renal dialysis: Secondary | ICD-10-CM | POA: Diagnosis not present

## 2021-02-19 DIAGNOSIS — J9601 Acute respiratory failure with hypoxia: Secondary | ICD-10-CM

## 2021-02-19 DIAGNOSIS — I248 Other forms of acute ischemic heart disease: Secondary | ICD-10-CM | POA: Diagnosis present

## 2021-02-19 DIAGNOSIS — E1165 Type 2 diabetes mellitus with hyperglycemia: Secondary | ICD-10-CM | POA: Diagnosis present

## 2021-02-19 DIAGNOSIS — I959 Hypotension, unspecified: Secondary | ICD-10-CM | POA: Diagnosis not present

## 2021-02-19 DIAGNOSIS — I5043 Acute on chronic combined systolic (congestive) and diastolic (congestive) heart failure: Secondary | ICD-10-CM | POA: Diagnosis not present

## 2021-02-19 DIAGNOSIS — E1121 Type 2 diabetes mellitus with diabetic nephropathy: Secondary | ICD-10-CM

## 2021-02-19 DIAGNOSIS — Z20822 Contact with and (suspected) exposure to covid-19: Secondary | ICD-10-CM | POA: Diagnosis not present

## 2021-02-19 DIAGNOSIS — Z6835 Body mass index (BMI) 35.0-35.9, adult: Secondary | ICD-10-CM | POA: Diagnosis not present

## 2021-02-19 DIAGNOSIS — J42 Unspecified chronic bronchitis: Secondary | ICD-10-CM | POA: Diagnosis not present

## 2021-02-19 DIAGNOSIS — N186 End stage renal disease: Secondary | ICD-10-CM | POA: Diagnosis present

## 2021-02-19 DIAGNOSIS — E669 Obesity, unspecified: Secondary | ICD-10-CM | POA: Diagnosis present

## 2021-02-19 DIAGNOSIS — G9341 Metabolic encephalopathy: Secondary | ICD-10-CM | POA: Diagnosis not present

## 2021-02-19 DIAGNOSIS — I6523 Occlusion and stenosis of bilateral carotid arteries: Secondary | ICD-10-CM | POA: Diagnosis present

## 2021-02-19 DIAGNOSIS — J690 Pneumonitis due to inhalation of food and vomit: Secondary | ICD-10-CM | POA: Diagnosis not present

## 2021-02-19 DIAGNOSIS — I5021 Acute systolic (congestive) heart failure: Secondary | ICD-10-CM | POA: Diagnosis not present

## 2021-02-19 DIAGNOSIS — E440 Moderate protein-calorie malnutrition: Secondary | ICD-10-CM | POA: Diagnosis not present

## 2021-02-19 DIAGNOSIS — R4182 Altered mental status, unspecified: Secondary | ICD-10-CM

## 2021-02-19 DIAGNOSIS — F05 Delirium due to known physiological condition: Secondary | ICD-10-CM | POA: Diagnosis not present

## 2021-02-19 DIAGNOSIS — I132 Hypertensive heart and chronic kidney disease with heart failure and with stage 5 chronic kidney disease, or end stage renal disease: Secondary | ICD-10-CM | POA: Diagnosis not present

## 2021-02-19 DIAGNOSIS — J9602 Acute respiratory failure with hypercapnia: Secondary | ICD-10-CM

## 2021-02-19 DIAGNOSIS — J440 Chronic obstructive pulmonary disease with acute lower respiratory infection: Secondary | ICD-10-CM | POA: Diagnosis present

## 2021-02-19 DIAGNOSIS — I272 Pulmonary hypertension, unspecified: Secondary | ICD-10-CM | POA: Diagnosis present

## 2021-02-19 DIAGNOSIS — R578 Other shock: Secondary | ICD-10-CM | POA: Diagnosis not present

## 2021-02-19 DIAGNOSIS — E1122 Type 2 diabetes mellitus with diabetic chronic kidney disease: Secondary | ICD-10-CM

## 2021-02-19 DIAGNOSIS — I480 Paroxysmal atrial fibrillation: Secondary | ICD-10-CM | POA: Diagnosis not present

## 2021-02-19 DIAGNOSIS — I429 Cardiomyopathy, unspecified: Secondary | ICD-10-CM | POA: Diagnosis not present

## 2021-02-19 DIAGNOSIS — E872 Acidosis, unspecified: Secondary | ICD-10-CM | POA: Diagnosis present

## 2021-02-19 DIAGNOSIS — N2581 Secondary hyperparathyroidism of renal origin: Secondary | ICD-10-CM | POA: Diagnosis present

## 2021-02-19 DIAGNOSIS — R0602 Shortness of breath: Secondary | ICD-10-CM | POA: Diagnosis present

## 2021-02-19 DIAGNOSIS — Z794 Long term (current) use of insulin: Secondary | ICD-10-CM

## 2021-02-19 LAB — CBC WITH DIFFERENTIAL/PLATELET
Abs Immature Granulocytes: 0.09 10*3/uL — ABNORMAL HIGH (ref 0.00–0.07)
Abs Immature Granulocytes: 0.04 10*3/uL (ref 0.00–0.07)
Basophils Absolute: 0.1 10*3/uL (ref 0.0–0.1)
Basophils Absolute: 0 10*3/uL (ref 0.0–0.1)
Basophils Relative: 0 %
Basophils Relative: 0 %
Eosinophils Absolute: 0.1 10*3/uL (ref 0.0–0.5)
Eosinophils Absolute: 0 10*3/uL (ref 0.0–0.5)
Eosinophils Relative: 1 %
Eosinophils Relative: 0 %
HCT: 36.3 % (ref 36.0–46.0)
HCT: 33.6 % — ABNORMAL LOW (ref 36.0–46.0)
Hemoglobin: 11 g/dL — ABNORMAL LOW (ref 12.0–15.0)
Hemoglobin: 10.9 g/dL — ABNORMAL LOW (ref 12.0–15.0)
Immature Granulocytes: 1 %
Immature Granulocytes: 0 %
Lymphocytes Relative: 9 %
Lymphocytes Relative: 6 %
Lymphs Abs: 1.4 10*3/uL (ref 0.7–4.0)
Lymphs Abs: 0.8 10*3/uL (ref 0.7–4.0)
MCH: 30.6 pg (ref 26.0–34.0)
MCH: 31.3 pg (ref 26.0–34.0)
MCHC: 30.3 g/dL (ref 30.0–36.0)
MCHC: 32.4 g/dL (ref 30.0–36.0)
MCV: 100.8 fL — ABNORMAL HIGH (ref 80.0–100.0)
MCV: 96.6 fL (ref 80.0–100.0)
Monocytes Absolute: 1.1 10*3/uL — ABNORMAL HIGH (ref 0.1–1.0)
Monocytes Absolute: 0.6 10*3/uL (ref 0.1–1.0)
Monocytes Relative: 7 %
Monocytes Relative: 5 %
Neutro Abs: 12 10*3/uL — ABNORMAL HIGH (ref 1.7–7.7)
Neutro Abs: 11.6 10*3/uL — ABNORMAL HIGH (ref 1.7–7.7)
Neutrophils Relative %: 82 %
Neutrophils Relative %: 89 %
Platelets: 338 10*3/uL (ref 150–400)
Platelets: 271 10*3/uL (ref 150–400)
RBC: 3.6 MIL/uL — ABNORMAL LOW (ref 3.87–5.11)
RBC: 3.48 MIL/uL — ABNORMAL LOW (ref 3.87–5.11)
RDW: 13.3 % (ref 11.5–15.5)
RDW: 13.2 % (ref 11.5–15.5)
WBC: 14.7 10*3/uL — ABNORMAL HIGH (ref 4.0–10.5)
WBC: 13 10*3/uL — ABNORMAL HIGH (ref 4.0–10.5)
nRBC: 0 % (ref 0.0–0.2)
nRBC: 0 % (ref 0.0–0.2)

## 2021-02-19 LAB — COMPREHENSIVE METABOLIC PANEL
ALT: 52 U/L — ABNORMAL HIGH (ref 0–44)
AST: 88 U/L — ABNORMAL HIGH (ref 15–41)
Albumin: 2.5 g/dL — ABNORMAL LOW (ref 3.5–5.0)
Alkaline Phosphatase: 116 U/L (ref 38–126)
Anion gap: 13 (ref 5–15)
BUN: 26 mg/dL — ABNORMAL HIGH (ref 8–23)
CO2: 24 mmol/L (ref 22–32)
Calcium: 8.2 mg/dL — ABNORMAL LOW (ref 8.9–10.3)
Chloride: 96 mmol/L — ABNORMAL LOW (ref 98–111)
Creatinine, Ser: 5.78 mg/dL — ABNORMAL HIGH (ref 0.44–1.00)
GFR, Estimated: 7 mL/min — ABNORMAL LOW (ref >60)
Glucose, Bld: 210 mg/dL — ABNORMAL HIGH (ref 70–99)
Potassium: 4.2 mmol/L (ref 3.5–5.1)
Sodium: 133 mmol/L — ABNORMAL LOW (ref 135–145)
Total Bilirubin: 1.1 mg/dL (ref 0.3–1.2)
Total Protein: 6.3 g/dL — ABNORMAL LOW (ref 6.5–8.1)

## 2021-02-19 LAB — RENAL FUNCTION PANEL
Albumin: 2.7 g/dL — ABNORMAL LOW (ref 3.5–5.0)
Anion gap: 12 (ref 5–15)
BUN: 19 mg/dL (ref 8–23)
CO2: 27 mmol/L (ref 22–32)
Calcium: 8.7 mg/dL — ABNORMAL LOW (ref 8.9–10.3)
Chloride: 97 mmol/L — ABNORMAL LOW (ref 98–111)
Creatinine, Ser: 5.07 mg/dL — ABNORMAL HIGH (ref 0.44–1.00)
GFR, Estimated: 8 mL/min — ABNORMAL LOW (ref >60)
Glucose, Bld: 216 mg/dL — ABNORMAL HIGH (ref 70–99)
Phosphorus: 4.2 mg/dL (ref 2.5–4.6)
Potassium: 4.2 mmol/L (ref 3.5–5.1)
Sodium: 136 mmol/L (ref 135–145)

## 2021-02-19 LAB — BLOOD GAS, ARTERIAL
Acid-Base Excess: 1.5 mmol/L (ref 0.0–2.0)
Bicarbonate: 27.5 mmol/L (ref 20.0–28.0)
Drawn by: 519031
FIO2: 28
O2 Saturation: 96.3 %
Patient temperature: 37.1
pCO2 arterial: 60.1 mmHg — ABNORMAL HIGH (ref 32.0–48.0)
pH, Arterial: 7.283 — ABNORMAL LOW (ref 7.350–7.450)
pO2, Arterial: 92 mmHg (ref 83.0–108.0)

## 2021-02-19 LAB — POCT I-STAT 7, (LYTES, BLD GAS, ICA,H+H)
Acid-Base Excess: 4 mmol/L — ABNORMAL HIGH (ref 0.0–2.0)
Bicarbonate: 26.7 mmol/L (ref 20.0–28.0)
Calcium, Ion: 1.05 mmol/L — ABNORMAL LOW (ref 1.15–1.40)
HCT: 33 % — ABNORMAL LOW (ref 36.0–46.0)
Hemoglobin: 11.2 g/dL — ABNORMAL LOW (ref 12.0–15.0)
O2 Saturation: 100 %
Patient temperature: 100.7
Potassium: 3.9 mmol/L (ref 3.5–5.1)
Sodium: 135 mmol/L (ref 135–145)
TCO2: 28 mmol/L (ref 22–32)
pCO2 arterial: 33.2 mmHg (ref 32.0–48.0)
pH, Arterial: 7.517 — ABNORMAL HIGH (ref 7.350–7.450)
pO2, Arterial: 168 mmHg — ABNORMAL HIGH (ref 83.0–108.0)

## 2021-02-19 LAB — GLUCOSE, CAPILLARY
Glucose-Capillary: 227 mg/dL — ABNORMAL HIGH (ref 70–99)
Glucose-Capillary: 206 mg/dL — ABNORMAL HIGH (ref 70–99)
Glucose-Capillary: 199 mg/dL — ABNORMAL HIGH (ref 70–99)
Glucose-Capillary: 204 mg/dL — ABNORMAL HIGH (ref 70–99)
Glucose-Capillary: 163 mg/dL — ABNORMAL HIGH (ref 70–99)
Glucose-Capillary: 160 mg/dL — ABNORMAL HIGH (ref 70–99)

## 2021-02-19 LAB — MRSA NEXT GEN BY PCR, NASAL: MRSA by PCR Next Gen: NOT DETECTED

## 2021-02-19 LAB — HEPARIN LEVEL (UNFRACTIONATED): Heparin Unfractionated: >1.1 IU/mL — ABNORMAL HIGH (ref 0.30–0.70)

## 2021-02-19 LAB — APTT: aPTT: 119 seconds — ABNORMAL HIGH (ref 24–36)

## 2021-02-19 LAB — HEPATITIS B SURFACE ANTIBODY, QUANTITATIVE
Hep B S AB Quant (Post): >1000 m[IU]/mL (ref >9.9)
Hep B S AB Quant (Post): >1000 m[IU]/mL (ref >9.9)

## 2021-02-19 LAB — AMMONIA: Ammonia: 15 umol/L (ref 9–35)

## 2021-02-19 LAB — PROCALCITONIN: Procalcitonin: 0.35 ng/mL

## 2021-02-19 MED ORDER — FENTANYL CITRATE PF 50 MCG/ML IJ SOSY
PREFILLED_SYRINGE | INTRAMUSCULAR | Status: AC
  Administered 2021-02-19: 09:00:00 50 ug via INTRAVENOUS
  Filled 2021-02-19: qty 2

## 2021-02-19 MED ORDER — IOHEXOL 350 MG/ML SOLN
75.0000 mL | Freq: Once | INTRAVENOUS | Status: AC | PRN
  Administered 2021-02-19: 10:00:00 75 mL via INTRAVENOUS

## 2021-02-19 MED ORDER — PROPOFOL 1000 MG/100ML IV EMUL
0.0000 ug/kg/min | INTRAVENOUS | Status: DC
  Administered 2021-02-19: 17:00:00 45 ug/kg/min via INTRAVENOUS
  Administered 2021-02-19 (×2): 35 ug/kg/min via INTRAVENOUS
  Administered 2021-02-20: 01:00:00 30 ug/kg/min via INTRAVENOUS
  Filled 2021-02-19 (×4): qty 100

## 2021-02-19 MED ORDER — PRISMASOL BGK 4/2.5 32-4-2.5 MEQ/L REPLACEMENT SOLN: Status: DC

## 2021-02-19 MED ORDER — HEPARIN SODIUM (PORCINE) 1000 UNIT/ML DIALYSIS
1000.0000 [IU] | INTRAMUSCULAR | Status: DC | PRN
  Administered 2021-02-19: 18:00:00 3000 [IU] via INTRAVENOUS_CENTRAL
  Filled 2021-02-19 (×3): qty 6
  Filled 2021-02-19: qty 3

## 2021-02-19 MED ORDER — ACETAMINOPHEN 325 MG PO TABS: 650.0000 mg | ORAL_TABLET | Freq: Four times a day (QID) | ORAL | Status: DC | PRN

## 2021-02-19 MED ORDER — PIPERACILLIN-TAZOBACTAM 3.375 G IVPB: 3.3750 g | Freq: Four times a day (QID) | INTRAVENOUS | Status: DC

## 2021-02-19 MED ORDER — ORAL CARE MOUTH RINSE
15.0000 mL | OROMUCOSAL | Status: DC
  Administered 2021-02-19 – 2021-02-22 (×32): 15 mL via OROMUCOSAL

## 2021-02-19 MED ORDER — NOREPINEPHRINE 4 MG/250ML-% IV SOLN
2.0000 ug/min | INTRAVENOUS | Status: DC
  Administered 2021-02-19 – 2021-02-22 (×2): 2 ug/min via INTRAVENOUS
  Administered 2021-02-22: 9 ug/min via INTRAVENOUS
  Filled 2021-02-19 (×2): qty 250

## 2021-02-19 MED ORDER — AMIODARONE HCL IN DEXTROSE 360-4.14 MG/200ML-% IV SOLN: 60.0000 mg/h | INTRAVENOUS | Status: DC

## 2021-02-19 MED ORDER — PIPERACILLIN-TAZOBACTAM 3.375 G IVPB 30 MIN
3.3750 g | Freq: Four times a day (QID) | INTRAVENOUS | Status: DC
  Administered 2021-02-19 – 2021-02-22 (×12): 3.375 g via INTRAVENOUS
  Filled 2021-02-19 (×25): qty 50

## 2021-02-19 MED ORDER — SODIUM CHLORIDE 0.9 % FOR CRRT: INTRAVENOUS_CENTRAL | Status: DC | PRN

## 2021-02-19 MED ORDER — MIDAZOLAM HCL 2 MG/2ML IJ SOLN
INTRAMUSCULAR | Status: AC
  Filled 2021-02-19: qty 2

## 2021-02-19 MED ORDER — SODIUM CHLORIDE 0.9 % IV SOLN: 500.0000 [IU]/h | INTRAVENOUS | Status: DC

## 2021-02-19 MED ORDER — APIXABAN 5 MG PO TABS: 5.0000 mg | ORAL_TABLET | Freq: Two times a day (BID) | ORAL | Status: DC

## 2021-02-19 MED ORDER — ACETAMINOPHEN 650 MG RE SUPP: 650.0000 mg | Freq: Four times a day (QID) | RECTAL | Status: DC | PRN

## 2021-02-19 MED ORDER — VANCOMYCIN HCL IN DEXTROSE 1-5 GM/200ML-% IV SOLN
1000.0000 mg | INTRAVENOUS | Status: DC
  Administered 2021-02-20 – 2021-02-21 (×2): 1000 mg via INTRAVENOUS
  Filled 2021-02-19 (×2): qty 200

## 2021-02-19 MED ORDER — ALTEPLASE 2 MG IJ SOLR: 2.0000 mg | Freq: Once | INTRAMUSCULAR | Status: DC | PRN

## 2021-02-19 MED ORDER — ONDANSETRON HCL 4 MG PO TABS: 4.0000 mg | ORAL_TABLET | Freq: Four times a day (QID) | ORAL | Status: DC | PRN

## 2021-02-19 MED ORDER — PANTOPRAZOLE SODIUM 40 MG IV SOLR
40.0000 mg | INTRAVENOUS | Status: DC
  Administered 2021-02-19 – 2021-02-25 (×7): 40 mg via INTRAVENOUS
  Filled 2021-02-19 (×7): qty 40

## 2021-02-19 MED ORDER — PRISMASOL BGK 4/2.5 32-4-2.5 MEQ/L EC SOLN: Status: DC

## 2021-02-19 MED ORDER — SENNOSIDES-DOCUSATE SODIUM 8.6-50 MG PO TABS: 1.0000 | ORAL_TABLET | Freq: Every evening | ORAL | Status: DC | PRN

## 2021-02-19 MED ORDER — SUCCINYLCHOLINE CHLORIDE 200 MG/10ML IV SOSY
PREFILLED_SYRINGE | INTRAVENOUS | Status: AC
  Filled 2021-02-19: qty 10

## 2021-02-19 MED ORDER — FENTANYL CITRATE PF 50 MCG/ML IJ SOSY: 25.0000 ug | PREFILLED_SYRINGE | INTRAMUSCULAR | Status: DC | PRN

## 2021-02-19 MED ORDER — PIPERACILLIN-TAZOBACTAM IN DEX 2-0.25 GM/50ML IV SOLN
2.2500 g | Freq: Three times a day (TID) | INTRAVENOUS | Status: DC
  Administered 2021-02-19: 11:00:00 2.25 g via INTRAVENOUS
  Filled 2021-02-19 (×2): qty 50

## 2021-02-19 MED ORDER — AMIODARONE HCL IN DEXTROSE 360-4.14 MG/200ML-% IV SOLN: 30.0000 mg/h | INTRAVENOUS | Status: DC

## 2021-02-19 MED ORDER — INSULIN ASPART 100 UNIT/ML IJ SOLN: 2.0000 [IU] | Freq: Three times a day (TID) | INTRAMUSCULAR | Status: DC

## 2021-02-19 MED ORDER — CHLORHEXIDINE GLUCONATE 0.12% ORAL RINSE (MEDLINE KIT)
15.0000 mL | Freq: Two times a day (BID) | OROMUCOSAL | Status: DC
  Administered 2021-02-19 – 2021-02-22 (×6): 15 mL via OROMUCOSAL

## 2021-02-19 MED ORDER — ONDANSETRON HCL 4 MG/2ML IJ SOLN: 4.0000 mg | Freq: Four times a day (QID) | INTRAMUSCULAR | Status: DC | PRN

## 2021-02-19 MED ORDER — RENA-VITE PO TABS
1.0000 | ORAL_TABLET | Freq: Every day | ORAL | Status: DC
  Administered 2021-02-19: 23:00:00 1
  Filled 2021-02-19: qty 1

## 2021-02-19 MED ORDER — INSULIN ASPART 100 UNIT/ML IJ SOLN
0.0000 [IU] | INTRAMUSCULAR | Status: DC
  Administered 2021-02-19 (×2): 2 [IU] via SUBCUTANEOUS
  Administered 2021-02-19: 16:00:00 3 [IU] via SUBCUTANEOUS
  Administered 2021-02-19 – 2021-02-20 (×2): 2 [IU] via SUBCUTANEOUS
  Administered 2021-02-20 (×2): 1 [IU] via SUBCUTANEOUS
  Administered 2021-02-20: 12:00:00 2 [IU] via SUBCUTANEOUS
  Administered 2021-02-21 – 2021-02-22 (×8): 1 [IU] via SUBCUTANEOUS
  Administered 2021-02-22: 2 [IU] via SUBCUTANEOUS
  Administered 2021-02-22: 1 [IU] via SUBCUTANEOUS
  Administered 2021-02-22: 2 [IU] via SUBCUTANEOUS
  Administered 2021-02-22: 3 [IU] via SUBCUTANEOUS
  Administered 2021-02-23: 5 [IU] via SUBCUTANEOUS
  Administered 2021-02-23: 3 [IU] via SUBCUTANEOUS
  Administered 2021-02-23: 5 [IU] via SUBCUTANEOUS
  Administered 2021-02-23: 2 [IU] via SUBCUTANEOUS
  Administered 2021-02-23: 5 [IU] via SUBCUTANEOUS
  Administered 2021-02-24 (×2): 2 [IU] via SUBCUTANEOUS
  Administered 2021-02-24: 1 [IU] via SUBCUTANEOUS
  Administered 2021-02-24: 2 [IU] via SUBCUTANEOUS
  Administered 2021-02-25: 1 [IU] via SUBCUTANEOUS
  Administered 2021-02-25: 2 [IU] via SUBCUTANEOUS
  Administered 2021-02-25: 3 [IU] via SUBCUTANEOUS
  Administered 2021-02-25 (×2): 2 [IU] via SUBCUTANEOUS
  Administered 2021-02-25: 1 [IU] via SUBCUTANEOUS
  Administered 2021-02-26 (×3): 2 [IU] via SUBCUTANEOUS

## 2021-02-19 MED ORDER — ETOMIDATE 2 MG/ML IV SOLN: 20.0000 mg | Freq: Once | INTRAVENOUS | Status: AC

## 2021-02-19 MED ORDER — VANCOMYCIN HCL 2000 MG/400ML IV SOLN
2000.0000 mg | Freq: Once | INTRAVENOUS | Status: AC
  Administered 2021-02-19: 12:00:00 2000 mg via INTRAVENOUS
  Filled 2021-02-19: qty 400

## 2021-02-19 MED ORDER — FENTANYL CITRATE PF 50 MCG/ML IJ SOSY
PREFILLED_SYRINGE | INTRAMUSCULAR | Status: AC
  Administered 2021-02-19: 10:00:00 50 ug via INTRAVENOUS
  Filled 2021-02-19: qty 2

## 2021-02-19 MED ORDER — NOREPINEPHRINE 4 MG/250ML-% IV SOLN
INTRAVENOUS | Status: AC
  Filled 2021-02-19: qty 250

## 2021-02-19 MED ORDER — HEPARIN (PORCINE) 25000 UT/250ML-% IV SOLN
1000.0000 [IU]/h | INTRAVENOUS | Status: DC
Start: 2021-02-19 — End: 2021-02-24
  Administered 2021-02-19: 11:00:00 1000 [IU]/h via INTRAVENOUS
  Administered 2021-02-20: 12:00:00 950 [IU]/h via INTRAVENOUS
  Administered 2021-02-21 – 2021-02-23 (×3): 1000 [IU]/h via INTRAVENOUS
  Filled 2021-02-19 (×5): qty 250

## 2021-02-19 MED ORDER — HYDRALAZINE HCL 20 MG/ML IJ SOLN: 10.0000 mg | INTRAMUSCULAR | Status: DC | PRN

## 2021-02-19 MED ORDER — PROPOFOL 1000 MG/100ML IV EMUL
INTRAVENOUS | Status: AC
  Administered 2021-02-19: 09:00:00 5 ug/kg/min via INTRAVENOUS
  Filled 2021-02-19: qty 100

## 2021-02-19 MED ORDER — AMIODARONE HCL 200 MG PO TABS
200.0000 mg | ORAL_TABLET | Freq: Two times a day (BID) | ORAL | Status: DC
Start: 2021-02-19 — End: 2021-02-19

## 2021-02-19 MED ORDER — DOCUSATE SODIUM 50 MG/5ML PO LIQD
100.0000 mg | Freq: Two times a day (BID) | ORAL | Status: DC
  Administered 2021-02-19 (×2): 100 mg
  Filled 2021-02-19 (×2): qty 10

## 2021-02-19 MED ORDER — POLYETHYLENE GLYCOL 3350 17 G PO PACK
17.0000 g | PACK | Freq: Every day | ORAL | Status: DC
  Administered 2021-02-19: 11:00:00 17 g
  Filled 2021-02-19: qty 1

## 2021-02-19 MED ORDER — ROCURONIUM BROMIDE 10 MG/ML (PF) SYRINGE
PREFILLED_SYRINGE | INTRAVENOUS | Status: AC
  Filled 2021-02-19: qty 10

## 2021-02-19 MED ORDER — VANCOMYCIN HCL IN DEXTROSE 1-5 GM/200ML-% IV SOLN: 1000.0000 mg | INTRAVENOUS | Status: DC

## 2021-02-19 MED ORDER — ROCURONIUM BROMIDE 50 MG/5ML IV SOLN: 50.0000 mg | Freq: Once | INTRAVENOUS | Status: AC

## 2021-02-19 MED ORDER — MIDAZOLAM HCL 2 MG/2ML IJ SOLN: 2.0000 mg | Freq: Once | INTRAMUSCULAR | Status: AC

## 2021-02-19 MED ORDER — ROCURONIUM BROMIDE 10 MG/ML (PF) SYRINGE
PREFILLED_SYRINGE | INTRAVENOUS | Status: AC
  Administered 2021-02-19: 09:00:00 50 mg via INTRAVENOUS
  Filled 2021-02-19: qty 10

## 2021-02-19 MED ORDER — FENTANYL CITRATE PF 50 MCG/ML IJ SOSY: 50.0000 ug | PREFILLED_SYRINGE | Freq: Once | INTRAMUSCULAR | Status: AC

## 2021-02-19 MED ORDER — BUDESONIDE 0.5 MG/2ML IN SUSP
0.5000 mg | Freq: Two times a day (BID) | RESPIRATORY_TRACT | Status: DC
  Administered 2021-02-19 – 2021-02-25 (×13): 0.5 mg via RESPIRATORY_TRACT
  Filled 2021-02-19 (×13): qty 2

## 2021-02-19 MED ORDER — IPRATROPIUM-ALBUTEROL 0.5-2.5 (3) MG/3ML IN SOLN: 3.0000 mL | RESPIRATORY_TRACT | Status: DC | PRN

## 2021-02-19 MED ORDER — ETOMIDATE 2 MG/ML IV SOLN
INTRAVENOUS | Status: AC
  Administered 2021-02-19: 09:00:00 20 mg via INTRAVENOUS
  Filled 2021-02-19: qty 20

## 2021-02-19 MED ORDER — SODIUM CHLORIDE 0.9 % IV SOLN: INTRAVENOUS | Status: DC | PRN

## 2021-02-19 MED ORDER — ATORVASTATIN CALCIUM 80 MG PO TABS
80.0000 mg | ORAL_TABLET | Freq: Every evening | ORAL | Status: DC
Start: 2021-02-19 — End: 2021-02-20
  Administered 2021-02-19: 18:00:00 80 mg
  Filled 2021-02-19: qty 1

## 2021-02-19 MED ORDER — HEPARIN SODIUM (PORCINE) 1000 UNIT/ML DIALYSIS: 1000.0000 [IU] | INTRAMUSCULAR | Status: DC | PRN

## 2021-02-19 MED ORDER — FENTANYL CITRATE PF 50 MCG/ML IJ SOSY
25.0000 ug | PREFILLED_SYRINGE | INTRAMUSCULAR | Status: DC | PRN
  Administered 2021-02-19 (×2): 50 ug via INTRAVENOUS
  Filled 2021-02-19: qty 1
  Filled 2021-02-19: qty 2

## 2021-02-19 MED ORDER — SODIUM CHLORIDE 0.9 % IV SOLN: 250.0000 mL | INTRAVENOUS | Status: DC

## 2021-02-19 MED ORDER — PERFLUTREN LIPID MICROSPHERE
1.0000 mL | INTRAVENOUS | Status: AC | PRN
Start: 2021-02-19 — End: 2021-02-19
  Administered 2021-02-19: 17:00:00 2 mL via INTRAVENOUS
  Filled 2021-02-19: qty 10

## 2021-02-19 MED ORDER — ETOMIDATE 2 MG/ML IV SOLN
INTRAVENOUS | Status: AC
  Filled 2021-02-19: qty 10

## 2021-02-19 MED ORDER — ARFORMOTEROL TARTRATE 15 MCG/2ML IN NEBU
15.0000 ug | INHALATION_SOLUTION | Freq: Two times a day (BID) | RESPIRATORY_TRACT | Status: DC
  Administered 2021-02-19 – 2021-02-25 (×13): 15 ug via RESPIRATORY_TRACT
  Filled 2021-02-19 (×13): qty 2

## 2021-02-19 MED ORDER — MIDAZOLAM HCL 2 MG/2ML IJ SOLN
INTRAMUSCULAR | Status: AC
  Administered 2021-02-19: 09:00:00 2 mg via INTRAVENOUS
  Filled 2021-02-19: qty 2

## 2021-02-19 NOTE — Progress Notes (Signed)
OT Cancellation Note  Patient Details Name: Stephanie Woodward MRN: [id number] DOB: 1941/01/29   Cancelled Treatment:    Reason Eval/Treat Not Completed: Medical issues which prohibited therapy (Pt unresponsive and transferred to ICU this morning. Will sign off and await new order.)  Malka So, OTR/L 02/19/2021, 9:16 AM

## 2021-02-19 NOTE — Progress Notes (Signed)
Pt transported by transport RT and RN from [place] to MRI and back with no complications.

## 2021-02-19 NOTE — Progress Notes (Addendum)
Advanced Heart Failure Rounding Note  PCP-Cardiologist: Ezzard Standing, MD   Subjective:    Developed fevers overnight, WBC 13.7 > 11.5 > 14.7. BC sent.  Procal pending.   Found inimally responsive around 7:30 this am and intubated for airway protection. Transferred to [place]. Code stroke activated.  Family at bedside this am noting recent onset of hand tremors and episodes of swallowing difficulty/choking  ABG: pH 7.283/CO2 60/HCO3 27.5  Sent for STAT head CT, pending  Sinus brady 40s-50s with short runs of AF    Objective:   Weight Range: 89.6 kg Body mass index is 34.99 kg/m.   Vital Signs:   Temp:  [97 F (36.1 C)-100.7 F (38.2 C)] 100.7 F (38.2 C) (12/29 0855) Pulse Rate:  [69-88] 70 (12/29 0855) Resp:  [14-32] 18 (12/29 0855) BP: (80-140)/(32-93) 132/69 (12/29 0801) SpO2:  [97 %-100 %] 100 % (12/29 0855) FiO2 (%):  [100 %] 100 % (12/29 0852) Weight:  [88.4 kg-89.6 kg] 89.6 kg (12/29 0524) Last BM Date: 02/17/21  Weight change: Filed Weights   02/17/21 1407 02/18/21 1630 02/19/21 0524  Weight: 89.8 kg 88.4 kg 89.6 kg    Intake/Output:   Intake/Output Summary (Last 24 hours) at 02/19/2021 0935 Last data filed at 02/18/2021 2030 Gross per 24 hour  Intake 598 ml  Output 690 ml  Net -92 ml      Physical Exam    General: Intubated and sedated HEENT: + ETT Neck: supple. JVP to jaw. Carotids 2+ bilat; no bruits.  Cor: PMI nondisplaced. Regular rate & rhythm. No rubs, gallops or murmurs. Lungs: coarse Abdomen: soft, nontender, nondistended. No hepatosplenomegaly. No bruits or masses. Good bowel sounds. Extremities: no cyanosis, clubbing, rash, edema Neuro: sedated    Telemetry   SR 40s-50s, short runs of AF  Labs    CBC Recent Labs    02/18/21 0516 02/19/21 0335  WBC 11.5* 14.7*  NEUTROABS  --  12.0*  HGB 10.1* 11.0*  HCT 32.9* 36.3  MCV 100.3* 100.8*  PLT 288 342   Basic Metabolic Panel Recent Labs    02/18/21 0516  02/19/21 0335  NA 140 136  K 3.6 4.2  CL 96* 97*  CO2 33* 27  GLUCOSE 187* 216*  BUN 26* 19  CREATININE 5.77* 5.07*  CALCIUM 8.1* 8.7*  PHOS  --  4.2   Liver Function Tests Recent Labs    02/18/21 1555 02/19/21 0335  AST 18  --   ALT 18  --   ALKPHOS 98  --   BILITOT 0.5  --   PROT 7.2  --   ALBUMIN 2.9* 2.7*   No results for input(s): LIPASE, AMYLASE in the last 72 hours. Cardiac Enzymes No results for input(s): CKTOTAL, CKMB, CKMBINDEX, TROPONINI in the last 72 hours.  BNP: BNP (last 3 results) Recent Labs    06/08/20 1011  BNP 402.0*    ProBNP (last 3 results) No results for input(s): PROBNP in the last 8760 hours.   D-Dimer No results for input(s): DDIMER in the last 72 hours. Hemoglobin A1C Recent Labs    02/18/21 0516  HGBA1C 9.1*   Fasting Lipid Panel No results for input(s): CHOL, HDL, LDLCALC, TRIG, CHOLHDL, LDLDIRECT in the last 72 hours. Thyroid Function Tests Recent Labs    02/18/21 1555  TSH 1.546    Other results:   Imaging    DG CHEST PORT 1 VIEW  Result Date: 02/19/2021 CLINICAL DATA:  Hypoxia EXAM: PORTABLE CHEST 1 VIEW  COMPARISON:  Chest x-ray 02/17/2021 FINDINGS: Endotracheal tube tip is 4 cm above the carina. Enteric tube tip is below the diaphragm. Heart is enlarged. Mediastinum appears grossly stable. Pulmonary vascular congestion with hazy perihilar and lower lung zone opacities, increased since previous study. No large pleural effusion identified. Trace left effusion not excluded. No pneumothorax. IMPRESSION: 1. Medical devices as described. 2. Cardiomegaly, pulmonary vascular congestion and increased hazy opacities since previous study which may represent increasing edema/infiltrate. Electronically Signed   By: Ofilia Neas M.D.   On: 02/19/2021 09:07     Medications:     Scheduled Medications:  amiodarone  200 mg Oral BID   apixaban  5 mg Oral BID   atorvastatin  80 mg Oral QPM   Chlorhexidine Gluconate Cloth   6 each Topical Q0600   docusate  100 mg Per Tube BID   fentaNYL       insulin aspart  0-9 Units Subcutaneous Q4H   latanoprost  1 drop Both Eyes QHS   mometasone-formoterol  2 puff Inhalation BID   pantoprazole (PROTONIX) IV  40 mg Intravenous Q24H   polyethylene glycol  17 g Per Tube Daily   sodium chloride flush  3 mL Intravenous Q12H   succinylcholine        Infusions:  propofol (DIPRIVAN) infusion 5 mcg/kg/min (02/19/21 0856)    PRN Medications: acetaminophen **OR** acetaminophen, albuterol, fentaNYL (SUBLIMAZE) injection, fentaNYL (SUBLIMAZE) injection, ondansetron **OR** ondansetron (ZOFRAN) IV, senna-docusate   Assessment/Plan   Atrial fibrillation:  -Known hx paroxysmal AF -Se[facility] in ED [facility] last week for AF and spontaneously converted to SR -Presented 02/17/21 with AF with RVR. Cardioverted in ER.  -SR with short runs of AF -Started po amio last night. Will hold for now with bradycardia -PFTs 2019 with restrictive defect, assessment of DLCO technically limited. No convincing ILD on CT chest 10/19 -Hold metoprolol -stop eliquis. Heparin gtt pending head CT 2. Acute on chronic primarily diastolic CHF: Echo in 0/62 with EF 50-55%, mild LVH, grade 2 diastolic dysfunction.  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  [place] 11/23/17 showed significantly elevated right and left heart filling pressures and severe primarily pulmonary venous hypertension. Cardiac output preserved. Echo (10/19) with EF 45-50%, septal-lateral dyssynchrony, RV looked ok.  PYP scan was not suggestive of transthyretin amyloidosis.  Echo in 1/21 with EF 55-60%, normal RV.  RHC/LHC in 4/21 with no significant CAD, normal filling pressures and cardiac output.   -NYHA Class III, chronic, and confounded by COPD and obesity.  -Volume up on admit with evidence of CHF on chest x-ray. Likely d/t AF with RVR. -+ JVP to jaw. Last had HD  yesterday, next session planned for tomorrow.  -Repeat echo 3. Acute respiratory failure: -Minimally responsive this am and intubated for airway protection -vent management per PCCM 4. Acute encephalopathy -Last seen at baseline around 3 am, found minimally responsive around 7:30 am -Head CT pending -EEG  -Neuro to evaluate 5. ESRD: HD per renal. Followed by Dr. Hollie Salk as outpatient 6. Pulmonary HTN: Mild PH on 4/21 RHC.  7. Hyperlipidemia: on atorvastatin 80.  8. DM II: A1c 9.1 -Per primary team 9. Leukocytosis -WBC trending up, 13.7>11.5>14.7  -Developed fever overnight with Tmax 100.7 -Pan cultures, started on empiric abx -Procal pending  Length of Stay: 0  [facility], [place], PA-C  02/19/2021, 9:35 AM  Advanced Heart Failure Team Pager [phone] (M-F; 7a - 5p)  Please contact [facility] for  night-coverage after hours (5p -7a ) and weekends on [email or web address]  Patient seen with PA, agree with the above note.   She was found unresponsive this morning and intubated, respiratory arrest.  ABG with hypercarbia but not marked.  CT head without acute event.    Currently, NSR in 13s.  Now on NE 2.  Was febrile overnight to 100.7.    General: Intubated/sedated.  Neck: JVP 10 cm, no thyromegaly or thyroid nodule.  Lungs: Clear to auscultation bilaterally with normal respiratory effort. CV: Nondisplaced PMI.  Heart brady, regular S1/S2, no S3/S4, no murmur.  No peripheral edema.   Abdomen: Soft, nontender, no hepatosplenomegaly, no distention.  Skin: Intact without lesions or rashes.  Neurologic: Alert and oriented x 3.  Psych: Normal affect. Extremities: No clubbing or cyanosis.  HEENT: Normal.   Cause of unresponsive event uncertain: due to hypercarbia from OHS/OSA, but CO2 not markedly high on ABG.  Head CT without acute finding.   - Support with vent for now.   Hypotensive on vent, on low dose NE at 2.  Will get echo.   She remains in NSR, holding amiodarone and Toprol XL  with bradycardia (HR in 40s).  Heparin gtt for now (stop apixaban).    She will need HD, CCM to place catheter for CVVH as intubated and on low dose pressor.   CRITICAL CARE Performed by: Loralie Champagne  Total critical care time: 35 minutes  Critical care time was exclusive of separately billable procedures and treating other patients.  Critical care was necessary to treat or prevent imminent or life-threatening deterioration.  Critical care was time spent personally by me on the following activities: development of treatment plan with patient and/or surrogate as well as nursing, discussions with consultants, evaluation of patient's response to treatment, examination of patient, obtaining history from patient or surrogate, ordering and performing treatments and interventions, ordering and review of laboratory studies, ordering and review of radiographic studies, pulse oximetry and re-evaluation of patient's condition.  Loralie Champagne 02/19/2021 4:19 PM

## 2021-02-19 NOTE — Procedures (Signed)
Patient Name: Stephanie Woodward  MRN: [id number]  Epilepsy Attending: Lora Havens  Referring Physician/Provider: Jennelle Human, NP Date: 02/19/2021 Duration: 23.14 mins  Patient history: 80 year old female with altered mental status.  EEG to evaluate for seizure.  Level of alertness: lethargic  AEDs during EEG study: Propofol  Technical aspects: This EEG study was done with scalp electrodes positioned according to the 10-20 International system of electrode placement. Electrical activity was acquired at a sampling rate of 500Hz  and reviewed with a high frequency filter of 70Hz  and a low frequency filter of 1Hz . EEG data were recorded continuously and digitally stored.   Description: EEG showed continuous generalized 3 to 6 Hz theta-delta slowing.  There is also an excessive amount of 15 to 18 Hz sharply contoured beta activity with irregular morphology distributed symmetrically and diffusely. Hyperventilation and photic stimulation were not performed.     ABNORMALITY - Continuous slow, generalized - Excessive beta, generalized  IMPRESSION: This study is suggestive of moderate to severe diffuse encephalopathy, nonspecific etiology but could be secondary to sedation. No seizures or definite epileptiform discharges were seen throughout the recording.  Stephanie Woodward Barbra Sarks

## 2021-02-19 NOTE — Plan of Care (Signed)
?  Problem: Activity: ?Goal: Capacity to carry out activities will improve ?Outcome: Progressing ?  ?

## 2021-02-19 NOTE — Consult Note (Signed)
NAME:  Stephanie Woodward, MRN:  [id number], DOB:  1941-02-12, LOS: 0 ADMISSION DATE:  02/17/2021, CONSULTATION DATE:  02/19/2021 REFERRING MD:  Dr. Olevia Bowens, CHIEF COMPLAINT:  AMS   History of Present Illness:   80 year old female with prior history of COPD, mild pulmonary hypertension, ESRD TTS, PAF on Eliquis, HFpEF, IDDM DMT2, and obesity who presented on 1227 from dialysis with shortness of breath and palpitations.  She did not receive full dialysis treatment, last full treatment on 12/24 at her EDW.  Prior hospitalization 1 week ago at [facility] found to be in A. fib with RVR and placed on Eliquis at that time.  Plans for DCCV however she self converted.  No reports of prior fever, cough, nausea, or vomiting.    Found in ER afebrile, normotensive, and normal oxygenation but in A. fib with RVR in which cardiology was consulted and she was cardioverted back to normal sinus.  She was admitted to [facility], nephrology consulted and underwent iHD on 12/28.  She did have some brief hypotension at the end of iHD but recovered.  Last seen at baseline around 0300 on 12/29.  Around 0730, she was found to be minimally unresponsive with concern for airway protection.  Rapid response at bedside, glucose 206, and no new or sedating meds other than her home remeron last night.  She remains in NSR and normotensive with good O2 saturations.  She was placed on BIPAP, ABG showing 7.283/ 60/ 92/ 27.5.  PCCM consulted and patient transferred to ICU.    Of note, speaking with patient's two daughters, they report patient has been having episodes with difficulty swallowing/ choking episodes at least for two weeks and patient complained of hand tremors 12/28.   Pertinent  Medical History  COPD, former smoker (quit 15 yrs ago) mild pulmonary hypertension, ESRD, PAF on Eliquis, HFpEF, IDDM DMT2, obesity  Significant Hospital Events: Including procedures, antibiotic start and stop dates in addition to other pertinent  events   12/27 Admitted [place], cards/ nephrology consulted, cardioverted,  12/28 iHD 12/29 unresponsive, LNW 0300, intubated for airway protection, CTH pending  Interim History / Subjective:   Objective   Blood pressure 132/69, pulse 78, temperature 98.9 F (37.2 C), temperature source Oral, resp. rate 18, height '5\' 3"'  (1.6 m), weight 89.6 kg, SpO2 98 %.        Intake/Output Summary (Last 24 hours) at 02/19/2021 0835 Last data filed at 02/18/2021 2030 Gross per 24 hour  Intake 598 ml  Output 690 ml  Net -92 ml   Filed Weights   02/17/21 1407 02/18/21 1630 02/19/21 0524  Weight: 89.8 kg 88.4 kg 89.6 kg   Examination: General:  Well nourished adult female sitting upright in bed on BIPAP  HEENT: full BiPAP face mask, pupils 3/minimally reactive, anicteric  Neuro:  grimaces to sternal, minimal withdrawal to pain in UE, R>L, withdrawals in LE CV: NSR, rr, no murmur PULM:  on BiPAP 12/5 with varying TV's 250-500, rate 18 and 40%, scattered rhonchi anteriorly GI:  obese, soft, bs+ Extremities: warm/dry, no LE edema, LUE with AVF +B/T, chronic venous changes in LE One PIV in [facility] Problem list    Assessment & Plan:   Acute encephalopathy, unclear etiology at this time, ddx including stroke (ICH/ embolic), CNS process, metabolic abnormality, r/o sepsis - she has reportedly been compliant with her Eliquis prior to DCCV.  Appears she missed one dose of Eliquis on 12/28 am.  Mild hypotension with SBP in  the 80's at the end of iHD, but no other severe fluctuations in BP P:  - glucose stable, no sedating meds overnight given, and no obvious lab abnormalities this am to explain mental status, has leukocytosis and tmax today of 100.7 but no reported infectious symptoms on admit but with recent dysphagia, sepsis to remain in differential - family reports recent reports of dysphagia and hand tremors (noted 12/28) - code stroke activated/ neurology to evaluate - stat [facility]  and CTA head/neck to assess/ rule out ICH/ stroke.  If CTH negative, consider MRI brain - check EKG for completeness, prior cardiac workup this admit negative - serial neuro exams - if CTH negative, consider EEG (no known hx of seizures) - serial neuro exams - mildly hypercarbic at 60, but no significant improvement in mental status on BIPAP - repeat labs including ammonia.  TSH was 1.546 on 02/18/21 - holding remeron  Acute respiratory insufficiency related to above  Dysphagia COPD, no exacerbation - no gag on exam, intubated for airway protection on arrival to ICU  - MV support, 4-8cc/kg IBW with goal Pplat <30 and DP<15  - CXR post intubation and ABG in 1 hour - no significant hypoxia on admit  - VAP prevention protocol/ PPI - PAD protocol for sedation> propofol/ prn fentanyl, RASS goal 0/-1 - wean FiO2 as able for SpO2 >92%  - daily SAT & SBT when meets criteria - holding dulera in place of home symbicort - brovanna/ pulmicort and prn duonebs - will need speech evaluation for dysphagia workup to start  Afib with RVR - DCCV on 12/27 - telemetry monitoring, has remained in NSR with few burst of RVR - will place on amio gtt for now (discussed with HF team) - cards following  - holding eliquis for now pending CTH, likely will need heparin bridge while critically ill   Leukocytosis  - pan culture- blood cultures, trach asp, and send urine  - check/ trend PCT - start empiric vanc/ zosyn (avoiding cefepime which can interfere with EEG if having seizures)   ESRD, TTS (still makes some urine) - per Nephrology, last iHD 12/28 - no current indications for emergent dialysis at present - trend renal indices - strict I/Os, bladder scan prn, daily weights  HFpEF Mild pulmonary HTN HTN HLD - 02/2019 TTE > EF 55-60%, G2DD, normal RV P:  - holding home indur, hydralazine, metoprolol,  crestor - prn hydralazine - HF team consulted by cards 12/28  IDDM DMT2 - changed to CBG q4 and  SSI sensitive  Other: given limited access and multiple medications needed, daughters have consented to CVL if needed.   Best Practice (right click and "Reselect all SmartList Selections" daily)   Diet/type: NPO DVT prophylaxis: other- on hold, likely will need heparin per pharmacy pending [place] GI prophylaxis: PPI Lines: N/A Foley:  N/A Code Status:  full code Last date of multidisciplinary goals of care discussion: 12/29  Daughters [name] and [name] at bedside.   Labs   CBC: Recent Labs  Lab 02/17/21 1420 02/18/21 0516 02/19/21 0335  WBC 13.7* 11.5* 14.7*  NEUTROABS  --   --  12.0*  HGB 11.9* 10.1* 11.0*  HCT 37.2 32.9* 36.3  MCV 96.9 100.3* 100.8*  PLT 300 288 947    Basic Metabolic Panel: Recent Labs  Lab 02/17/21 1420 02/18/21 0516 02/19/21 0335  NA 141 140 136  K 3.3* 3.6 4.2  CL 95* 96* 97*  CO2 32 33* 27  GLUCOSE 205* 187* 216*  BUN 22 26* 19  CREATININE 4.42* 5.77* 5.07*  CALCIUM 8.1* 8.1* 8.7*  PHOS  --   --  4.2   GFR: Estimated Creatinine Clearance: 9.7 mL/min (A) (by C-G formula based on SCr of 5.07 mg/dL (H)). Recent Labs  Lab 02/17/21 1420 02/18/21 0516 02/19/21 0335  WBC 13.7* 11.5* 14.7*    Liver Function Tests: Recent Labs  Lab 02/18/21 1555 02/19/21 0335  AST 18  --   ALT 18  --   ALKPHOS 98  --   BILITOT 0.5  --   PROT 7.2  --   ALBUMIN 2.9* 2.7*   No results for input(s): LIPASE, AMYLASE in the last 168 hours. No results for input(s): AMMONIA in the last 168 hours.  ABG    Component Value Date/Time   PHART 7.283 (L) 02/19/2021 0745   PCO2ART 60.1 (H) 02/19/2021 0745   PO2ART 92.0 02/19/2021 0745   HCO3 27.5 02/19/2021 0745   TCO2 34 (H) 10/31/2020 0623   O2SAT 96.3 02/19/2021 0745     Coagulation Profile: No results for input(s): INR, PROTIME in the last 168 hours.  Cardiac Enzymes: No results for input(s): CKTOTAL, CKMB, CKMBINDEX, TROPONINI in the last 168 hours.  HbA1C: Hemoglobin A1C  Date/Time Value  Ref Range Status  08/08/2020 10:15 AM 8.3 (A) 4.0 - 5.6 % Final  02/06/2020 11:12 AM 8.2 (A) 4.0 - 5.6 % Final   Hgb A1c MFr Bld  Date/Time Value Ref Range Status  02/18/2021 05:16 AM 9.1 (H) 4.8 - 5.6 % Final    Comment:    (NOTE) Pre diabetes:          5.7%-6.4%  Diabetes:              >6.4%  Glycemic control for   <7.0% adults with diabetes   01/24/2019 05:29 PM 8.9 (H) 4.8 - 5.6 % Final    Comment:    (NOTE) Pre diabetes:          5.7%-6.4% Diabetes:              >6.4% Glycemic control for   <7.0% adults with diabetes     CBG: Recent Labs  Lab 02/18/21 0744 02/18/21 1619 02/18/21 2125 02/19/21 0621 02/19/21 0735  GLUCAP 131* 118* 239* 227* 206*    Review of Systems:   Unable   Past Medical History:  She,  has a past medical history of Arthritis, Asthma, Atrial fibrillation ([place]), CHF (congestive heart failure) ([place]), CKD (chronic kidney disease), stage III ([place]), COPD (chronic obstructive pulmonary disease) ([place]), Diabetes mellitus, Gout, Heart murmur, History of kidney stones, Hyperlipemia, Hypertension, Psoriasis, Renal disorder, Single kidney, and Sleep apnea.   Surgical History:   Past Surgical History:  Procedure Laterality Date   A/V FISTULAGRAM Left 10/31/2020   Procedure: A/V FISTULAGRAM;  Surgeon: Angelia Mould, MD;  Location: [place] [facility];  Service: Cardiovascular;  Laterality: Left;   ABDOMINAL HYSTERECTOMY     AV FISTULA PLACEMENT Left 11/17/2018   Procedure: BRACHIO-CEPHALIC ARTERIOVENOUS (AV) FISTULA CREATION IN LEFT ARM;  Surgeon: Marty Heck, MD;  Location: [place];  Service: Vascular;  Laterality: Left;   CHOLECYSTECTOMY     INSERTION OF DIALYSIS CATHETER Right 01/26/2019   Procedure: INSERTION OF DIALYSIS CATHETER, right internal jugular;  Surgeon: Angelia Mould, MD;  Location: [place];  Service: Vascular;  Laterality: Right;   LEFT AND RIGHT HEART CATHETERIZATION WITH CORONARY ANGIOGRAM N/A 11/29/2013   Procedure:  LEFT AND RIGHT HEART CATHETERIZATION WITH  CORONARY ANGIOGRAM;  Surgeon: Jacolyn Reedy, MD;  Location[facility] CATH LAB;  Service: Cardiovascular;  Laterality: N/A;   PERIPHERAL VASCULAR INTERVENTION Left 10/31/2020   Procedure: PERIPHERAL VASCULAR INTERVENTION;  Surgeon: Angelia Mould, MD;  Location: [place] [facility];  Service: Cardiovascular;  Laterality: Left;   RIGHT HEART CATH N/A 11/23/2017   Procedure: RIGHT HEART CATH;  Surgeon: Larey Dresser, MD;  Location: [place] [facility];  Service: Cardiovascular;  Laterality: N/A;   RIGHT/LEFT HEART CATH AND CORONARY ANGIOGRAPHY N/A 06/15/2019   Procedure: RIGHT/LEFT HEART CATH AND CORONARY ANGIOGRAPHY;  Surgeon: Larey Dresser, MD;  Location: [place] [facility];  Service: Cardiovascular;  Laterality: N/A;     Social History:   reports that she quit smoking about 12 years ago. Her smoking use included cigarettes. She has a 20.00 pack-year smoking history. She has never used smokeless tobacco. She reports current alcohol use. She reports that she does not currently use drugs.   Family History:  Her family history includes CVA (age of onset: 72) in her father; Cancer (age of onset: 36) in her brother; Cancer (age of onset: 51) in her mother; Lupus in her daughter.   Allergies Allergies  Allergen Reactions   Eggs Or Egg-Derived Products Nausea And Vomiting   Lisinopril Nausea And Vomiting   Penicillins Nausea And Vomiting    Has patient had a PCN reaction causing immediate rash, facial/tongue/throat swelling, SOB or lightheadedness with hypotension: No Has patient had a PCN reaction causing severe rash involving mucus membranes or skin necrosis: No Has patient had a PCN reaction that required hospitalization: No Has patient had a PCN reaction occurring within the last 10 years: No If all of the above answers are "NO", then may proceed with Cephalosporin use.    Other Other (See Comments)     Home Medications  Prior to Admission  medications   Medication Sig Start Date End Date Taking? Authorizing Provider  acetaminophen (TYLENOL) 500 MG tablet Take 500 mg by mouth every 6 (six) hours as needed (pain).   Yes [provider]  albuterol (PROVENTIL HFA;VENTOLIN HFA) 108 (90 BASE) MCG/ACT inhaler Inhale 2 puffs into the lungs every 4 (four) hours as needed for shortness of breath.   Yes [provider]  atorvastatin (LIPITOR) 80 MG tablet Take 80 mg by mouth every evening.    Yes [provider]  B Complex-C-Zn-Folic Acid (DIALYVITE 035-DHRC 15) 0.8 MG TABS Take 1 tablet by mouth in the morning. 03/07/19  Yes [provider]  Blood Glucose Monitoring Suppl (FREESTYLE LITE) DEVI Use as instructed to check blood sugar 4 times daily Patient taking differently: 1 each by Other route See admin instructions. Use as instructed to check blood sugar 4 times daily 12/29/20  Yes Shamleffer, Melanie Crazier, MD  budesonide-formoterol (SYMBICORT) 160-4.5 MCG/ACT inhaler Inhale 2 puffs into the lungs 2 (two) times daily. 04/09/20  Yes Byrum, Rose Fillers, MD  Continuous Blood Gluc Receiver (FREESTYLE LIBRE READER) DEVI 1 kit by Does not apply route daily. Use as instructed E11.65 09/12/19  Yes Shamleffer, Melanie Crazier, MD  ELIQUIS 5 MG TABS tablet Take 1 tablet by mouth twice daily Patient taking differently: Take 5 mg by mouth 2 (two) times daily. 07/23/20  Yes Larey Dresser, MD  glucose blood (FREESTYLE LITE) test strip USE AS DIRECTED 4 TIMES DAILY Patient taking differently: 1 each by Other route See admin instructions. USE AS DIRECTED 4 TIMES DAILY 02/02/21  Yes Shamleffer, Melanie Crazier,  MD  hydrALAZINE (APRESOLINE) 100 MG tablet TAKE 1 TABLET BY MOUTH THREE TIMES DAILY ** PLEASE SCHEDULE AN APPOINTMENT FOR FUTURE REFILLS** Patient taking differently: Take 50 mg by mouth 2 (two) times daily. 02/10/21  Yes Larey Dresser, MD  insulin aspart (NOVOLOG FLEXPEN) 100 UNIT/ML FlexPen Inject 10 Units into the  skin 2 (two) times daily with breakfast and lunch AND 12 Units daily with supper. Patient taking differently: Inject 10 Units into the skin 2 (two) times daily with breakfast and lunch and 12 units with supper. 02/06/20  Yes Shamleffer, Melanie Crazier, MD  insulin degludec (TRESIBA FLEXTOUCH) 100 UNIT/ML FlexTouch Pen Inject 18 Units into the skin daily. 02/06/20  Yes Shamleffer, Melanie Crazier, MD  Insulin Pen Needle 32G X 4 MM MISC 1 Device by Does not apply route in the morning, at noon, in the evening, and at bedtime. 02/06/20  Yes Shamleffer, Melanie Crazier, MD  isosorbide mononitrate (IMDUR) 30 MG 24 hr tablet Take 1 tablet by mouth once daily Patient taking differently: Take 30 mg by mouth daily. 09/10/20  Yes Larey Dresser, MD  ketoconazole (NIZORAL) 2 % cream Apply 1 application topically every morning. 02/16/21  Yes [provider]  ketoconazole (NIZORAL) 2 % shampoo Apply 1 application topically See admin instructions. 2-3 times weekly 02/16/21  Yes [provider]  latanoprost (XALATAN) 0.005 % ophthalmic solution Place 1 drop into both eyes at bedtime.   Yes [provider]  lidocaine (LIDODERM) 5 % Place 1 patch onto the skin daily. 11/12/20  Yes [provider]  lidocaine (LINDAMANTLE) 3 % CREA cream Apply 1 application topically as needed (Before dialysis on Tuesdays, Thursday, and Saturday.). 03/06/20  Yes [provider]  metoprolol succinate (TOPROL-XL) 25 MG 24 hr tablet TAKE 2 TABLETS BY MOUTH ONCE DAILY IN THE MORNING AND THEN TAKE 1 TABLET BY MOUTH ONCE DAILY IN THE EVENING Patient taking differently: Take 25-50 mg by mouth See admin instructions. TAKE 2 TABLETS BY MOUTH ONCE DAILY IN THE MORNING AND THEN TAKE 1 TABLET BY MOUTH ONCE DAILY IN THE EVENING 02/13/21  Yes Larey Dresser, MD  mirtazapine (REMERON) 15 MG tablet Take 15 mg by mouth at bedtime.   Yes [provider]  ondansetron (ZOFRAN) 4 MG tablet Take 4 mg by  mouth 2 (two) times daily as needed for nausea.  10/26/18  Yes [provider]  pantoprazole (PROTONIX) 40 MG tablet Take 40 mg by mouth every evening.  10/26/18  Yes [provider]  sucroferric oxyhydroxide (VELPHORO) 500 MG chewable tablet Chew 500 mg by mouth See admin instructions. Crush or chew or swallow 1 tablet three times a day with meals and 1 tablet two times a day with snacks.   Yes [provider]  NARCAN 4 MG/0.1ML LIQD nasal spray kit Place 1 spray into the nose See admin instructions. Call EMS at first sign of opioid overdose. Place on spray in nose.  If no response, then place one spray ing the opposite side of the nose. 01/02/19   [provider]  traMADol (ULTRAM) 50 MG tablet Take 50 mg by mouth every 6 (six) hours as needed for severe pain (for pain.).  Patient not taking: Reported on 02/17/2021    [provider]     Critical care time: 55 mins       Kennieth Rad, ACNP [facility] 02/19/2021, 8:36 AM  See Amion for pager If no response to pager, please call PCCM  consult pager After 7:00 pm call Elink

## 2021-02-19 NOTE — Progress Notes (Signed)
Finished receiving bed side report from offgoing night RN and the patient appeared to be asleep. Patient was unable to be aroused by NT shortly after when NT attempted to take 0800 VS. RN was immediately notified and returned to the room to assess the patient. RN performed a sternal rub on the patient. Patient didn't respond to measures. RR paged and notified, MD is currently at bedside. CN notified.

## 2021-02-19 NOTE — Progress Notes (Signed)
RT NOTES: Pt transported to 2H06 on bipap to be intubated for airway protection.

## 2021-02-19 NOTE — Progress Notes (Signed)
There is no appropriate vein seen on [place] for peripheral IV placement. L arm restricted.

## 2021-02-19 NOTE — Procedures (Signed)
Intubation Procedure Note  Stephanie Woodward  [id number]  October 23, 1940  Date:02/19/21  Time:9:40 AM   Provider Performing:Lille Karim C Tamala Julian    Procedure: Intubation (15041)  Indication(s) Respiratory Failure  Consent Unable to obtain consent due to emergent nature of procedure.   Anesthesia Etomidate, Versed, Fentanyl, and Rocuronium   Time Out Verified patient identification, verified procedure, site/side was marked, verified correct patient position, special equipment/implants available, medications/allergies/relevant history reviewed, required imaging and test results available.   Sterile Technique Usual hand hygeine, masks, and gloves were used   Procedure Description Patient positioned in bed supine.  Sedation given as noted above.  Patient was intubated with endotracheal tube using Glidescope.  View was Grade 2 only posterior commissure .  Number of attempts was 1.  Colorimetric CO2 detector was consistent with tracheal placement.   Complications/Tolerance None; patient tolerated the procedure well. Chest X-ray is ordered to verify placement.   EBL Minimal   Specimen(s) None

## 2021-02-19 NOTE — Consult Note (Signed)
Neurology consult   CC: code stroke.  History is obtained from: RN on unit.   HPI: Stephanie Woodward is a 80 yo female with a PMHx of COPD, pulmonary HTN, ESRD t/th/sat, PAF on Eliquis, CdHF, IDDM II, and obesity who presented from HD facility via ambulance or SOB and palpitaions. She did not receive a full HD tx, but per RN, she did get about 2.5 hours worth. Just hospitalized at [facility] and diagnosed with Afib with RVR and placed on Eliquis.   This morning at 0300, she was seen at the nurse's station awake, alert, and oriented talking with staff. She was assisted back to bed. RT came to check on her around 0730 and found her unresponsive. She was intubated with Roc and Atomidate about 1 hour prior to CT. Due to high BP, patient was brought to CT scan to r/o bleed. No brain bleed or LVO on imaging.   Her exam in CT was noted to be unresponsive except to pain, and her toes would point up with pain. No response to sternal rub or other noxious stimuli in UEs. No corneal reflexes. No cough or gag.   After CTAs, patient was taken back to 2H06.   NP went to 2H to examine patient again and exam was same as in CT. NP spoke to family at bedside. Family said patient had tremors of her hands yesterday, but no shaking or jerking or LOC. Informed family that we will check EEG given her unresponsiveness and MRI brain to further help with etiology of presentation.   LKW: 0300  hours TNK given?: No, low suspicion for stroke.  IR Thrombectomy?: No, no LVO.  MRS: 0  NIHSS:  1a Level of Consciousness: 3 1b LOC Questions: 1 1c LOC Commands: 2 2 Best Gaze: 2 3 Visual: 2 4 Facial Palsy: 0 5a Motor Arm - left: 4 5b Motor Arm - Right: 4 6a Motor Leg - Left: 4 6b Motor Leg - Right: 4 7 Limb Ataxia: 0 8 Sensory: 2 9 Best Language: 3 10 Dysarthria: 0 11 Extinction and Inattention: 0 TOTAL:  27  ROS: A robust ROS was unable to be performed due to emergent nature of event.   Past Medical History:   Diagnosis Date   Arthritis    Asthma    Atrial fibrillation (HCC)    CHF (congestive heart failure) (HCC)    CKD (chronic kidney disease), stage III (HCC)    COPD (chronic obstructive pulmonary disease) (HCC)    Diabetes mellitus    INSULIN DEPENDENT   Gout    Heart murmur    no issues per pt   History of kidney stones    Hyperlipemia    Hypertension    Psoriasis    Renal disorder    congenital   Single kidney    Sleep apnea    doesn't use the Cpap   Family History  Problem Relation Age of Onset   Lupus Daughter    Cancer Mother 13       type unknown   CVA Father 56   Cancer Brother 31       type unknown   Social History:  reports that she quit smoking about 12 years ago. Her smoking use included cigarettes. She has a 20.00 pack-year smoking history. She has never used smokeless tobacco. She reports current alcohol use. She reports that she does not currently use drugs.  Prior to Admission medications   Medication Sig Start Date End Date Taking? Authorizing  Provider  acetaminophen (TYLENOL) 500 MG tablet Take 500 mg by mouth every 6 (six) hours as needed (pain).   Yes [provider]  albuterol (PROVENTIL HFA;VENTOLIN HFA) 108 (90 BASE) MCG/ACT inhaler Inhale 2 puffs into the lungs every 4 (four) hours as needed for shortness of breath.   Yes [provider]  atorvastatin (LIPITOR) 80 MG tablet Take 80 mg by mouth every evening.    Yes [provider]  B Complex-C-Zn-Folic Acid (DIALYVITE 989-QJJH 15) 0.8 MG TABS Take 1 tablet by mouth in the morning. 03/07/19  Yes [provider]  Blood Glucose Monitoring Suppl (FREESTYLE LITE) DEVI Use as instructed to check blood sugar 4 times daily Patient taking differently: 1 each by Other route See admin instructions. Use as instructed to check blood sugar 4 times daily 12/29/20  Yes Shamleffer, Melanie Crazier, MD  budesonide-formoterol (SYMBICORT) 160-4.5 MCG/ACT inhaler Inhale 2 puffs into the  lungs 2 (two) times daily. 04/09/20  Yes Byrum, Rose Fillers, MD  Continuous Blood Gluc Receiver (FREESTYLE LIBRE READER) DEVI 1 kit by Does not apply route daily. Use as instructed E11.65 09/12/19  Yes Shamleffer, Melanie Crazier, MD  ELIQUIS 5 MG TABS tablet Take 1 tablet by mouth twice daily Patient taking differently: Take 5 mg by mouth 2 (two) times daily. 07/23/20  Yes Larey Dresser, MD  glucose blood (FREESTYLE LITE) test strip USE AS DIRECTED 4 TIMES DAILY Patient taking differently: 1 each by Other route See admin instructions. USE AS DIRECTED 4 TIMES DAILY 02/02/21  Yes Shamleffer, Melanie Crazier, MD  hydrALAZINE (APRESOLINE) 100 MG tablet TAKE 1 TABLET BY MOUTH THREE TIMES DAILY ** PLEASE SCHEDULE AN APPOINTMENT FOR FUTURE REFILLS** Patient taking differently: Take 50 mg by mouth 2 (two) times daily. 02/10/21  Yes Larey Dresser, MD  insulin aspart (NOVOLOG FLEXPEN) 100 UNIT/ML FlexPen Inject 10 Units into the skin 2 (two) times daily with breakfast and lunch AND 12 Units daily with supper. Patient taking differently: Inject 10 Units into the skin 2 (two) times daily with breakfast and lunch and 12 units with supper. 02/06/20  Yes Shamleffer, Melanie Crazier, MD  insulin degludec (TRESIBA FLEXTOUCH) 100 UNIT/ML FlexTouch Pen Inject 18 Units into the skin daily. 02/06/20  Yes Shamleffer, Melanie Crazier, MD  Insulin Pen Needle 32G X 4 MM MISC 1 Device by Does not apply route in the morning, at noon, in the evening, and at bedtime. 02/06/20  Yes Shamleffer, Melanie Crazier, MD  isosorbide mononitrate (IMDUR) 30 MG 24 hr tablet Take 1 tablet by mouth once daily Patient taking differently: Take 30 mg by mouth daily. 09/10/20  Yes Larey Dresser, MD  ketoconazole (NIZORAL) 2 % cream Apply 1 application topically every morning. 02/16/21  Yes [provider]  ketoconazole (NIZORAL) 2 % shampoo Apply 1 application topically See admin instructions. 2-3 times weekly 02/16/21  Yes [provider]  latanoprost (XALATAN) 0.005 % ophthalmic solution Place 1 drop into both eyes at bedtime.   Yes [provider]  lidocaine (LIDODERM) 5 % Place 1 patch onto the skin daily. 11/12/20  Yes [provider]  lidocaine (LINDAMANTLE) 3 % CREA cream Apply 1 application topically as needed (Before dialysis on Tuesdays, Thursday, and Saturday.). 03/06/20  Yes [provider]  metoprolol succinate (TOPROL-XL) 25 MG 24 hr tablet TAKE 2 TABLETS BY MOUTH ONCE DAILY IN THE MORNING AND THEN TAKE 1 TABLET BY MOUTH ONCE DAILY IN THE EVENING Patient taking differently: Take 25-50 mg by mouth  See admin instructions. TAKE 2 TABLETS BY MOUTH ONCE DAILY IN THE MORNING AND THEN TAKE 1 TABLET BY MOUTH ONCE DAILY IN THE EVENING 02/13/21  Yes Larey Dresser, MD  mirtazapine (REMERON) 15 MG tablet Take 15 mg by mouth at bedtime.   Yes [provider]  ondansetron (ZOFRAN) 4 MG tablet Take 4 mg by mouth 2 (two) times daily as needed for nausea.  10/26/18  Yes [provider]  pantoprazole (PROTONIX) 40 MG tablet Take 40 mg by mouth every evening.  10/26/18  Yes [provider]  sucroferric oxyhydroxide (VELPHORO) 500 MG chewable tablet Chew 500 mg by mouth See admin instructions. Crush or chew or swallow 1 tablet three times a day with meals and 1 tablet two times a day with snacks.   Yes [provider]  NARCAN 4 MG/0.1ML LIQD nasal spray kit Place 1 spray into the nose See admin instructions. Call EMS at first sign of opioid overdose. Place on spray in nose.  If no response, then place one spray ing the opposite side of the nose. 01/02/19   [provider]  traMADol (ULTRAM) 50 MG tablet Take 50 mg by mouth every 6 (six) hours as needed for severe pain (for pain.).  Patient not taking: Reported on 02/17/2021    [provider]    Exam: Current vital signs: BP 132/69    Pulse 70    Temp (!) 100.7 F (38.2 C) (Axillary)    Resp 18     Ht '5\' 3"'  (1.6 m)    Wt 89.6 kg    SpO2 100%    BMI 34.99 kg/m   Physical Exam  Constitutional: Acutely ill, obese female lying on CT table.   Psych: unresponsive.  Eyes: No scleral injection. HENT: No OP obstruction. Head: Normocephalic.  Cardiovascular: Normal rate and regular rhythm.  Respiratory: Intubated/ventilated.   GI: Abdomen soft.  No distension. There is no tenderness.  Skin: WDI.  Neuro: GCS:  Best Eye  1     Best verbal  1    Best motor:  3       Total:  5 Mental Status: Patient verbally unresponsive and does not arouse to name calling, loud clap, or tactile stimulation. No signs of neglect. Speech/Language:  No intelligible speech.  Cranial Nerves: PERRL Eyes are conjugate. No corneal reflexes. No reaction to brow pressure. No blink to threat. No cough, gag. Face is symmetrical at rest while intubated. Head is grossly midline. intubated.  Motor: No reaction/movement to noxious stimuli to trapezius or biceps bilaterally. Her toes go upward when noxious stimuli to thighs.  No spontaneous or purposeful movements of extremities. Toes are up going. Moves toes to pain bilateral LEs.   I have reviewed labs in epic and the pertinent results are: creatinine 3.8. Glucose 141.   MD reviewed the images obtained:  NCT head  1. No evidence of acute intracranial abnormality. 2. Moderate chronic microvascular ischemic disease.  CTA head and neck  1. No large vessel occlusion. 2. 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   Assessment: 80 yo female with stroke risk factors of OSA not on Bipap, HTN, HLD, pulmonary HTN, AFib (new dx), sedentary lifestyle, and IDDM II. Presentation per HPI. It is unclear at this time the etiology of her unresponsiveness-paralytic from intubation given her ESRD, metabolic/toxic encephalopathy, seizures, acidosis, uremia, infection.   Plan: - PCCM patient.  - MRI brain without contrast. - stat EEG.  -HbA1c, lipid panel,  ammonia. -Restart Eliquis when possible.  -telemetry monitoring for arrhythmia. -Continue to search for and correct toxic/metabolic issues.   Patient seen by Clance Boll, MSN, APN-BC, nurse practitioner and by MD. Note/plan to be edited by MD as needed.  Pager: [phone]

## 2021-02-19 NOTE — Procedures (Signed)
Arterial Catheter Insertion Procedure Note  Julietta Batterman  [id number]  11/26/1940  Date:02/19/21  Time:3:05 PM    Provider Performing: Candee Furbish    Procedure: Insertion of Arterial Line [phone]) with US guidance (44034)   Indication(s) Blood pressure monitoring and/or need for frequent ABGs  Consent Risks of the procedure as well as the alternatives and risks of each were explained to the patient and/or caregiver.  Consent for the procedure was obtained and is signed in the bedside chart  Anesthesia None   Time Out Verified patient identification, verified procedure, site/side was marked, verified correct patient position, special equipment/implants available, medications/allergies/relevant history reviewed, required imaging and test results available.   Sterile Technique Maximal sterile technique including full sterile barrier drape, hand hygiene, sterile gown, sterile gloves, mask, hair covering, sterile ultrasound probe cover (if used).   Procedure Description Area of catheter insertion was cleaned with chlorhexidine and draped in sterile fashion. With real-time ultrasound guidance an arterial catheter was placed into the right radial artery.  Appropriate arterial tracings confirmed on monitor.     Complications/Tolerance None; patient tolerated the procedure well.   EBL Minimal   Specimen(s) None

## 2021-02-19 NOTE — Progress Notes (Signed)
°  Echocardiogram 2D Echocardiogram has been performed.  Stephanie Woodward 02/19/2021, 5:27 PM

## 2021-02-19 NOTE — Code Documentation (Signed)
Stroke Response Nurse Documentation Code Documentation  Stephanie Woodward is a 80 y.o. female admitted to [facility] on 02/17/2021 for SOB with past medical hx of ESRD on TTS HD, Afib, CHF, LBBB, DM, COPD, HLP. On Eliquis (apixaban) daily.   Patient from [facility] where she was LKW at 0300 and now complaining of being unresponsive per staff. From Rapid Response report, patient was up walking arpund at 0300 and talking with staff. At 073, NT found patient not as talkative when doing VS and then RT noted patient unresponsive. Upon Rapid Response evaluation, patient would move all extremities to painful stimuli and yelled out. No gaze noted. Patient was emergently intubated and transferred to [facility] for care. CT Head Code Stroke ordered to ruleout bleed.  Stroke team at the bedside after being called regarding CT. Patient to CT with 2H staff. NIHSS 32, see documentation for details and code stroke times. Patient with decreased LOC, disoriented, not following commands, bilateral hemianopia, bilateral facial droop, bilateral arm weakness, bilateral leg weakness, bilateral decreased sensation, Global aphasia , and dysarthria  on exam. The following imaging was completed: CT, CTA head and neck. Patient is not a candidate for IV Thrombolytic due to being on Eliquis. Patient is not a candidate for IR due to due to no LVO on imaging per MD Collins.   Care/Plan: Return to ICU. Continue with metabolic work-up.   Bedside handoff with 2H RN.     Kathrin Greathouse  Stroke Response RN

## 2021-02-19 NOTE — Procedures (Signed)
Central Venous Catheter Insertion Procedure Note  Stephanie Woodward  [id number]  Apr 05, 1940  Date:02/19/21  Time:4:45 PM   Provider Performing:Pete Johnette Abraham Kary Kos   Procedure: Insertion of Non-tunneled Central Venous Catheter(36556)with US guidance (76720)    Indication(s) Medication administration, Difficult access, and Hemodialysis  Consent Risks of the procedure as well as the alternatives and risks of each were explained to the patient and/or caregiver.  Consent for the procedure was obtained and is signed in the bedside chart  Anesthesia Topical only with 1% lidocaine   Timeout Verified patient identification, verified procedure, site/side was marked, verified correct patient position, special equipment/implants available, medications/allergies/relevant history reviewed, required imaging and test results available.  Sterile Technique Maximal sterile technique including full sterile barrier drape, hand hygiene, sterile gown, sterile gloves, mask, hair covering, sterile ultrasound probe cover (if used).  Procedure Description Area of catheter insertion was cleaned with chlorhexidine and draped in sterile fashion.   With real-time ultrasound guidance a HD catheter was placed into the right internal jugular vein.  Nonpulsatile blood flow and easy flushing noted in all ports.  The catheter was sutured in place and sterile dressing applied.  Complications/Tolerance None; patient tolerated the procedure well. Chest X-ray is ordered to verify placement for internal jugular or subclavian cannulation.  Chest x-ray is not ordered for femoral cannulation.  EBL Minimal  Specimen(s) None  Erick Colace ACNP-BC [place] Pager # [phone] OR # [phone] if no answer

## 2021-02-19 NOTE — Progress Notes (Signed)
Fentanyl syringe came apart prior to intubation (plunger separated from syringe and unable to place back together) - unable to waste in pyxis. Medication appropriately wasted with Lonia Chimera, PharmD.   Antonietta Jewel, PharmD, BCCCP Clinical Pharmacist  Phone: [phone] 02/19/2021 9:00 AM  Please check AMION for all [place] phone numbers After 10:00 PM, call [place] [phone]

## 2021-02-19 NOTE — Progress Notes (Signed)
ANTICOAGULATION CONSULT NOTE - Initial Consult  Pharmacy Consult for apixaban>heparin infusion Indication: atrial fibrillation  Allergies  Allergen Reactions   Eggs Or Egg-Derived Products Nausea And Vomiting   Lisinopril Nausea And Vomiting   Penicillins Nausea And Vomiting    Has patient had a PCN reaction causing immediate rash, facial/tongue/throat swelling, SOB or lightheadedness with hypotension: No Has patient had a PCN reaction causing severe rash involving mucus membranes or skin necrosis: No Has patient had a PCN reaction that required hospitalization: No Has patient had a PCN reaction occurring within the last 10 years: No If all of the above answers are "NO", then may proceed with Cephalosporin use.    Other Other (See Comments)    Patient Measurements: Height: _0  (160 cm) Weight: 89.6 kg (197 lb 8.5 oz) IBW/kg (Calculated) : 52.4 Heparin Dosing Weight: 72.7 kg   Vital Signs: Temp: 100.7 F (38.2 C) (12/29 0855) Temp Source: Axillary (12/29 0855) BP: 132/69 (12/29 0801) Pulse Rate: 70 (12/29 0855)  Labs: Recent Labs    02/17/21 1420 02/17/21 1640 02/18/21 0516 02/19/21 0335  HGB 11.9*  --  10.1* 11.0*  HCT 37.2  --  32.9* 36.3  PLT 300  --  288 338  CREATININE 4.42*  --  5.77* 5.07*  TROPONINIHS 64* 67*  --   --     Estimated Creatinine Clearance: 9.7 mL/min (A) (by C-G formula based on SCr of 5.07 mg/dL (H)).   Medical History: Past Medical History:  Diagnosis Date   Arthritis    Asthma    Atrial fibrillation (HCC)    CHF (congestive heart failure) (HCC)    CKD (chronic kidney disease), stage III (HCC)    COPD (chronic obstructive pulmonary disease) (HCC)    Diabetes mellitus    INSULIN DEPENDENT   Gout    Heart murmur    no issues per pt   History of kidney stones    Hyperlipemia    Hypertension    Psoriasis    Renal disorder    congenital   Single kidney    Sleep apnea    doesn't use the Cpap    Medications:  Scheduled:    arformoterol  15 mcg Nebulization BID   atorvastatin  80 mg Per Tube QPM   budesonide (PULMICORT) nebulizer solution  0.5 mg Nebulization BID   chlorhexidine gluconate (MEDLINE KIT)  15 mL Mouth Rinse BID   Chlorhexidine Gluconate Cloth  6 each Topical Q0600   docusate  100 mg Per Tube BID   insulin aspart  0-9 Units Subcutaneous Q4H   latanoprost  1 drop Both Eyes QHS   mouth rinse  15 mL Mouth Rinse 10 times per day   pantoprazole (PROTONIX) IV  40 mg Intravenous Q24H   polyethylene glycol  17 g Per Tube Daily   sodium chloride flush  3 mL Intravenous Q12H   succinylcholine        Assessment: 61 yof presenting with Afib - on apixaban PTA for hx Afib. Last dose on 12/28_1 .   Transferred from ICU due to unresponsiveness - now intubated. CT head showing no bleed but moderate chronic microvascular ischemia disease and severe L/moderate R intracranial ICA stenosis. Hgb 11, plt 338. No s/sx of bleeding.   Given recent DOAC, will need to monitor both aPTT and heparin level until correlate.   Goal of Therapy:  Heparin level 0.3-0.7 units/ml aPTT 66-102 seconds Monitor platelets by anticoagulation protocol: Yes   Plan:  No heparin bolus given  recent apixaban Will start heparin infusion at 1000 units/hr on 12/29_0   Order heparin level in 8 hours Monitor daily HL, CBC, and for s/sx of bleeding   Antonietta Jewel, PharmD, [place] Pharmacist  Phone: [phone] 02/19/2021 10:17 AM  Please check AMION for all [place] phone numbers After 10:00 PM, call [place] [phone]

## 2021-02-19 NOTE — Progress Notes (Signed)
Pharmacy Antibiotic Note  Stephanie Woodward is a 80 y.o. female admitted on 02/17/2021 with sepsis.  Pharmacy has been consulted for vancomycin and zosyn dosing.  Was found unresponsive, requiring transfer to ICU for intubation to protect airway. WBC 14.7, temp 100.7. Scr 5.07 (CrCl 9.7 mL/min) - known hx of ESRD. Pt to transition to CRRT for now.  Plan: Vancomycin 1000mg  IV q24h starting tomorrow night Zosyn 3.375g IV q6h over 30 min  Height: 5\' 3"  (160 cm) Weight: 89.6 kg (197 lb 8.5 oz) IBW/kg (Calculated) : 52.4  Temp (24hrs), Avg:99.8 F (37.7 C), Min:98.9 F (37.2 C), Max:100.7 F (38.2 C)  Recent Labs  Lab 02/17/21 1420 02/18/21 0516 02/19/21 0335 02/19/21 1052  WBC 13.7* 11.5* 14.7* 13.0*  CREATININE 4.42* 5.77* 5.07* 5.78*     Estimated Creatinine Clearance: 8.5 mL/min (A) (by C-G formula based on SCr of 5.78 mg/dL (H)).    Allergies  Allergen Reactions   Eggs Or Egg-Derived Products Nausea And Vomiting   Lisinopril Nausea And Vomiting   Penicillins Nausea And Vomiting    Has patient had a PCN reaction causing immediate rash, facial/tongue/throat swelling, SOB or lightheadedness with hypotension: No Has patient had a PCN reaction causing severe rash involving mucus membranes or skin necrosis: No Has patient had a PCN reaction that required hospitalization: No Has patient had a PCN reaction occurring within the last 10 years: No If all of the above answers are "NO", then may proceed with Cephalosporin use.    Other Other (See Comments)    Antimicrobials this admission: Vancomycin 12/29 >>  Zosyn 12/29 >>   Dose adjustments this admission: N/A  Microbiology results: Bcx 12/29: sent  COVID/Flu PCR 12/27: neg   Thank you for allowing pharmacy to be a part of this patients care.  Arrie Senate, PharmD, BCPS, [facility] Clinical Pharmacist [phone] Please check AMION for all [place] numbers 02/19/2021

## 2021-02-19 NOTE — Progress Notes (Signed)
°  Transition of Care (TOC) Screening Note   Patient Details  Name: Stephanie Woodward Date of Birth: 21-Jul-1940   Transition of Care [facility]) CM/SW Contact:    Raford Brissett, LCSW Phone Number: 02/19/2021, 5:06 PM    Transition of Care Department [facility]) has reviewed patient and no TOC needs have been identified at this time. We will continue to monitor patient advancement through interdisciplinary progression rounds. Patient will benefit from PT/OT consult for disposition recommendations. Possible need for home 02 at DC? If new patient transition needs arise, please place a TOC consult.

## 2021-02-19 NOTE — Progress Notes (Signed)
[facility] for apixaban>heparin infusion Indication: atrial fibrillation  Allergies  Allergen Reactions   Eggs Or Egg-Derived Products Nausea And Vomiting   Lisinopril Nausea And Vomiting   Penicillins Nausea And Vomiting    Has patient had a PCN reaction causing immediate rash, facial/tongue/throat swelling, SOB or lightheadedness with hypotension: No Has patient had a PCN reaction causing severe rash involving mucus membranes or skin necrosis: No Has patient had a PCN reaction that required hospitalization: No Has patient had a PCN reaction occurring within the last 10 years: No If all of the above answers are "NO", then may proceed with Cephalosporin use.    Other Other (See Comments)    Patient Measurements: Height: '5\' 3"'  (160 cm) Weight: 89.6 kg (197 lb 8.5 oz) IBW/kg (Calculated) : 52.4 Heparin Dosing Weight: 72.7 kg   Vital Signs: Temp: 94 F (34.4 C) (12/29 2000) Temp Source: Axillary (12/29 2000) BP: 155/52 (12/29 2000) Pulse Rate: 41 (12/29 2000)  Labs: Recent Labs    02/17/21 1420 02/17/21 1640 02/18/21 0516 02/19/21 0335 02/19/21 1052 02/19/21 1436 02/19/21 1841  HGB 11.9*  --  10.1* 11.0* 10.9* 11.2*  --   HCT 37.2  --  32.9* 36.3 33.6* 33.0*  --   PLT 300  --  288 338 271  --   --   APTT  --   --   --   --   --   --  119*  HEPARINUNFRC  --   --   --   --   --   --  >1.10*  CREATININE 4.42*  --  5.77* 5.07* 5.78*  --   --   TROPONINIHS 64* 67*  --   --   --   --   --      Estimated Creatinine Clearance: 8.5 mL/min (A) (by C-G formula based on SCr of 5.78 mg/dL (H)).   Medical History: Past Medical History:  Diagnosis Date   Arthritis    Asthma    Atrial fibrillation (HCC)    CHF (congestive heart failure) (HCC)    CKD (chronic kidney disease), stage III (HCC)    COPD (chronic obstructive pulmonary disease) (HCC)    Diabetes mellitus    INSULIN DEPENDENT   Gout    Heart murmur    no issues per pt    History of kidney stones    Hyperlipemia    Hypertension    Psoriasis    Renal disorder    congenital   Single kidney    Sleep apnea    doesn't use the Cpap    Medications:  Scheduled:   arformoterol  15 mcg Nebulization BID   atorvastatin  80 mg Per Tube QPM   budesonide (PULMICORT) nebulizer solution  0.5 mg Nebulization BID   chlorhexidine gluconate (MEDLINE KIT)  15 mL Mouth Rinse BID   Chlorhexidine Gluconate Cloth  6 each Topical Q0600   docusate  100 mg Per Tube BID   insulin aspart  0-9 Units Subcutaneous Q4H   latanoprost  1 drop Both Eyes QHS   mouth rinse  15 mL Mouth Rinse 10 times per day   multivitamin  1 tablet Per Tube QHS   pantoprazole (PROTONIX) IV  40 mg Intravenous Q24H   polyethylene glycol  17 g Per Tube Daily   sodium chloride flush  3 mL Intravenous Q12H    Assessment: 76 yof presenting with Afib - on apixaban PTA for hx Afib. Last dose  on 12/28'@2245' .   Transferred from ICU due to unresponsiveness - now intubated. CT head showing no bleed but moderate chronic microvascular ischemia disease and severe L/moderate R intracranial ICA stenosis. Hgb 11, plt 338. No s/sx of bleeding.   Given recent DOAC, will need to monitor both aPTT and heparin level until correlate.   Heparin level altered by DOAC, aPTT is above goal at 119 seconds.  Goal of Therapy:  Heparin level 0.3-0.7 units/ml aPTT 66-102 seconds Monitor platelets by anticoagulation protocol: Yes   Plan:  Reduce heparin to 800 units/h Recheck heparin level and aPTT in 8h  Arrie Senate, PharmD, [place], [facility] Clinical Pharmacist [phone] Please check AMION for all [place] numbers 02/19/2021

## 2021-02-19 NOTE — Significant Event (Signed)
Rapid Response Event Note   Reason for Call :  unresponsive  Initial Focused Assessment:  Patient is lying in the bed.  She will move to painful stimuli and say "ouch" but otherwise not wake up.  She moves her legs bilaterally better than she moves her arms.  She does not follow commands. She has her eyes squeezed shut.  Pupils 3/sluggish-brisk  No lateral  BP 132/69  HR 86  RR 18  on 2L [place] O2 sat 98% occasionally drops into 80s CBG 206 She has no gag reflex She occasionally sounds obstructive/improved with HOB elevated. Lung sounds diminished Heart tones irregular.  SR with PACs   Interventions:  ABG done Labs drawn Placed on Bipap  Transported to 2H06 via bed with RT Patient belongings including phone and charging cord on bed.    Plan of Care:  Urgent intubation Stat head CT   Event Summary:   MD Notified: Dr Aileen Fass at bedside Call Time: 0732 Arrival Time: 0740 End Time: 2902  Raliegh Ip, RN

## 2021-02-19 NOTE — Progress Notes (Signed)
Pharmacy Antibiotic Note  Stephanie Woodward is a 80 y.o. female admitted on 02/17/2021 with sepsis.  Pharmacy has been consulted for vancomycin and zosyn dosing.  Was found unresponsive, requiring transfer to ICU for intubation to protect airway. WBC 14.7, temp 100.7. Scr 5.07 (CrCl 9.7 mL/min) - known hx of ESRD.   Plan: Vancomycin 2g IV once then 1g IV qHD Zosyn 2.25 mg IV every 8 hours Monitor HD schedule, cx results, clinical pic, vanc levels as needed  Height: 5\' 3"  (160 cm) Weight: 89.6 kg (197 lb 8.5 oz) IBW/kg (Calculated) : 52.4  Temp (24hrs), Avg:98.8 F (37.1 C), Min:97 F (36.1 C), Max:100.7 F (38.2 C)  Recent Labs  Lab 02/17/21 1420 02/18/21 0516 02/19/21 0335  WBC 13.7* 11.5* 14.7*  CREATININE 4.42* 5.77* 5.07*    Estimated Creatinine Clearance: 9.7 mL/min (A) (by C-G formula based on SCr of 5.07 mg/dL (H)).    Allergies  Allergen Reactions   Eggs Or Egg-Derived Products Nausea And Vomiting   Lisinopril Nausea And Vomiting   Penicillins Nausea And Vomiting    Has patient had a PCN reaction causing immediate rash, facial/tongue/throat swelling, SOB or lightheadedness with hypotension: No Has patient had a PCN reaction causing severe rash involving mucus membranes or skin necrosis: No Has patient had a PCN reaction that required hospitalization: No Has patient had a PCN reaction occurring within the last 10 years: No If all of the above answers are "NO", then may proceed with Cephalosporin use.    Other Other (See Comments)    Antimicrobials this admission: Vancomycin 12/29 >>  Zosyn 12/29 >>   Dose adjustments this admission: N/A  Microbiology results: Bcx 12/29: sent  COVID/Flu PCR 12/27: neg   Thank you for allowing pharmacy to be a part of this patients care.  Antonietta Jewel, PharmD, [place] Clinical Pharmacist  Phone: [phone] 02/19/2021 10:10 AM  Please check AMION for all [place] phone numbers After 10:00 PM, call [place]  [phone]

## 2021-02-19 NOTE — Progress Notes (Signed)
[facility] Progress Note   Subjective:    700 cc off on HD yesterday net UF.  Had normal BP's post HD in the evening posted. Pt found unresponsive this am. Had fevers to 101 degrees last night. Wasn't protecting airway, pt intubated by CCM, moved to ICU.   Objective Vitals:   02/19/21 1230 02/19/21 1245 02/19/21 1247 02/19/21 1251  BP: (!) 130/50 (!) 126/31 (!) 109/47 125/68  Pulse: 65 (!) 58 (!) 53 (!) 58  Resp: 19 (!) 7 (!) 0 12  Temp:      TempSrc:      SpO2: 100% 100% 100% 100%  Weight:      Height:       Physical Exam General: Older female; NAD Neck: +JVD Heart: Irregular; No murmurs, gallops, or rubs Lungs: Clear anteriorly; diminished laterally Abdomen: Large, soft, non-tender Extremities: No edema UE or LE bilat Dialysis Access: LUA BCF +bruit  OP HD:  [place] [facility]     4h  350/500  87.2kg  2/2 bath  P2  Hep none   - hect 5 ug tiw   - venofer 50 q wk  - no esa     CXR 12/27 - IMPRESSION: Cardiomegaly with mild pulmonary interstitial edema and trace bilateral pleural effusions suggesting CHF.    CXR 12/29 - IMPRESSION: Cardiomegaly, pulmonary vascular congestion and increased hazy opacities since previous study which may represent increasing edema/infiltrate. '    Na 133  K.4.2  CO2 24  BUN 26  cr 5.78  CA 8.2  alb 2.5  WBC 13K Hb 10.9   Assessment/Plan: AMS - not sure septic Earlean Polka) vs CVA vs other. Neuro consulting.  Acute resp insufficiency/ possible PNA - w/ fever/ ^wbc/ poor MS, possible PNA.  CCM started broad-spec IV abx w/ vanc/ zosyn.  Intubated as well d/t.  Paroxysmal Afib - cardioverted in ED 12/27.  On Eliquis and Metoprolol ESRD - on HD TTS.  Had HD yest off schedule w/ low BP's late in treatment w/ low UF 690 cc. 2 kg up by wts, no edema UE/LE today. CXR suggesting CHF could also be aspiration PNA.  Anemia of CKD- Hgb 10.1-monitor trends for now. MBD ckd - Ca 8's, phos 5's. Cont vdra here HTN/volume - Bps soft, does not appear volume  overloaded Nutrition - Renal diet with fluid restriction  Kelly Splinter, MD 02/19/2021, 1:26 PM     Additional Objective Labs: Basic Metabolic Panel: CBC: Recent Labs  Lab 02/17/21 1420 02/18/21 0516 02/19/21 0335 02/19/21 1052  WBC 13.7* 11.5* 14.7* 13.0*  NEUTROABS  --   --  12.0* 11.6*  HGB 11.9* 10.1* 11.0* 10.9*  HCT 37.2 32.9* 36.3 33.6*  MCV 96.9 100.3* 100.8* 96.6  PLT 300 288 338 271     Iron Studies: No results for input(s): IRON, TIBC, TRANSFERRIN, FERRITIN in the last 72 hours. Lab Results  Component Value Date   INR 1.1 11/17/2018  Medications:  sodium chloride Stopped (02/19/21 1135)   heparin 1,000 Units/hr (02/19/21 1200)   piperacillin-tazobactam (ZOSYN)  IV Stopped (02/19/21 1133)   propofol (DIPRIVAN) infusion 35 mcg/kg/min (02/19/21 1200)   vancomycin 200 mL/hr at 02/19/21 1200    arformoterol  15 mcg Nebulization BID   atorvastatin  80 mg Per Tube QPM   budesonide (PULMICORT) nebulizer solution  0.5 mg Nebulization BID   chlorhexidine gluconate (MEDLINE KIT)  15 mL Mouth Rinse BID   Chlorhexidine Gluconate Cloth  6 each Topical Q0600   docusate  100  mg Per Tube BID   insulin aspart  0-9 Units Subcutaneous Q4H   latanoprost  1 drop Both Eyes QHS   mouth rinse  15 mL Mouth Rinse 10 times per day   pantoprazole (PROTONIX) IV  40 mg Intravenous Q24H   polyethylene glycol  17 g Per Tube Daily   sodium chloride flush  3 mL Intravenous Q12H   succinylcholine

## 2021-02-19 NOTE — Progress Notes (Signed)
EEG complete - results pending 

## 2021-02-19 NOTE — Progress Notes (Signed)
Initial Nutrition Assessment  DOCUMENTATION CODES:   Not applicable  INTERVENTION:   If unable to extubate within 24-48 hours, recommend initiation of TF  Tube Feeding via OG:  Vital 1.5 at 40 ml/hr Pro-Source TF 45 mL QID Provides 109 g of protein, 1600 kcals and 730 mL of free water  TF regimen and propofol at current rate providing 1842 total kcal/day   Add Renal MVI  NUTRITION DIAGNOSIS:   Inadequate oral intake related to acute illness as evidenced by NPO status.  GOAL:   Patient will meet greater than or equal to 90% of their needs   MONITOR:   TF tolerance, Labs, Weight trends, Vent status  REASON FOR ASSESSMENT:   Ventilator    ASSESSMENT:   80 yo female admitted on 12/27 with afib with RVR, acute on chronic CHF/pulmonary edema. Pt found unresponsive on 12/29 requiring intubation. PMH includes ESRD on HD, CHF, DM, COPD, HLD  12/27 Admitted 12/28 iHD 12/29 Intubated  Pt remains on vent support, on propofol at 9.2  Outpatient EDW 87.2 kg; current wt 89.6 kg. +edema   Unable to obtain diet and weight history from patient at this time  Labs: sodium 133 (L), potassium wdl Meds: ss novolog, miralax    Diet Order:   Diet Order             Diet NPO time specified  Diet effective now                   EDUCATION NEEDS:   Not appropriate for education at this time  Skin:  Skin Assessment: Reviewed RN Assessment  Last BM:  12/27  Height:   Ht Readings from Last 1 Encounters:  02/19/21 5\' 3"  (1.6 m)    Weight:   Wt Readings from Last 1 Encounters:  02/19/21 89.6 kg    BMI:  Body mass index is 34.99 kg/m.  Estimated Nutritional Needs:   Kcal:  1600-1800 kcals  Protein:  105-130 g  Fluid:  1000 mL plus UOP  Kerman Passey MS, RDN, LDN, CNSC Registered Dietitian III Clinical Nutrition RD Pager and On-Call Pager Number Located in [place]

## 2021-02-19 NOTE — Progress Notes (Signed)
RT NOTES: Pt found unresponsive. RN called to room. MD now at bedside. ABG obtained.

## 2021-02-19 NOTE — Progress Notes (Signed)
[facility] PROGRESS NOTE    Progress Note  Stephanie Woodward  MOQ:[id number] DOB: 1940-11-02 DOA: 02/17/2021 PCP: Larey Dresser, MD     Brief Narrative:   Stephanie Woodward is an 80 y.o. female past medical history significant for end-stage renal disease, paroxysmal atrial fibrillation on Eliquis, chronic diastolic heart failure, insulin-dependent diabetes mellitus type 2 who comes into the ED for shortness of breath.  In the ED she was found to be A. fib with RVR with a blood pressure of 91/64 pulse 113.  Started on IV diltiazem infusion, cardiology was consulted recommended cardioversion, post cardioversion EKG shows sinus rhythm with PACs cardiology recommended start Lopressor 25 every 6.  If needed they would add amiodarone if her rhythm cannot be controlled.  Nephrology was consulted.   Assessment/Plan:   Paroxysmal atrial fibrillation with RVR (HCC) Status post cardioversion currently on Eliquis. Started on amiodarone 200 mg p.o. twice daily then 200 mg daily.  Switch back to her metoprolol 50 in the morning and 25 in the evening. She remains in sinus rhythm.  LFTs and TSH are unremarkable  Acute hypoxic respiratory failure secondary to acute on chronic diastolic heart failure/pulmonary edema: Likely secondary to A. fib with RVR. Left heart cath in 2015 showed nonobstructive disease elevated filling pressures. Right heart cath showed significant elevated right and left filling pressures and severe pulmonary venous hypertension. Post HD her last dialysis treatment was on 02/18/2021. Nephrology has been notified she is getting dialysis this morning. Try to wean to room air. Blood pressure is borderline will need to hold Imdur and hydralazine.   No acute metabolic encephalopathy: Her blood glucose 200, she is on 2 L of oxygen satting greater 92%, her blood pressure has trended up last visit was 130/70. ABG is pending. She is only responsive to painful stimuli, not able to  protect her airway, will consult PCCM for possible intubation and transferred to the ICU. Check a CT of the head to rule out a bleed she is currently on Eliquis.  End-stage renal disease on hemodialysis: Unable to complete her last hemodialysis treatment due to A. fib with RVR.  Status post HD net ultrafiltration 1.3 L.  Imdur and hydralazine had to be held due to low blood pressure.  Further management per nephrology. Nephrology has been notified.  Insulin-dependent diabetes mellitus type 2: Currently on sliding scale insulin.  With an A1c of 9.1. Blood glucose trending high start meal coverage insulin  COPD Gold Continue inhalers.  Essential hypertension: Holding hydralazine and Imdur due to soft blood pressure, continue metoprolol every 6. Blood pressure is improved since controlling heart rate.  Mild leukocytosis: T-max of 99.9, her white blood cell continues to rise. Check blood cultures continue to hold antibiotics.  Elevated troponins: In the setting of A. fib with RVR and end-stage renal disease.  She denies any chest pain.    DVT prophylaxis: eliquis Family Communication:none Status is: Observation  The patient remains OBS appropriate and will d/c before 2 midnights.   Code Status:     Code Status Orders  (From admission, onward)           Start     Ordered   02/17/21 2028  Full code  Continuous        02/17/21 2028           Code Status History     Date Active Date Inactive Code Status Order ID Comments User Context   06/15/2019 1025 06/15/2019 1826 Full Code [id number]  Larey Dresser, MD Inpatient   01/24/2019 1033 01/31/2019 2203 Full Code [id number]  Norval Morton, MD ED   11/23/2017 1046 11/27/2017 1912 Full Code [id number]  Georgiana Shore, NP Inpatient   04/04/2016 1750 04/06/2016 1540 Full Code [id number]  Johnney Ou ED   03/09/2016 0847 03/11/2016 1941 Full Code [id number]  Waldemar Dickens, MD Inpatient   02/06/2014 0157  02/08/2014 1655 Full Code [id number]  Rise Patience, MD Inpatient   12/05/2013 2047 12/07/2013 2149 Full Code [id number]  Crista Luria Inpatient   11/29/2013 1114 11/29/2013 1805 Full Code [id number]  Jacolyn Reedy, MD Inpatient         IV Access:   Peripheral IV   Procedures and diagnostic studies:   DG Chest Portable 1 View  Result Date: 02/17/2021 CLINICAL DATA:  Shortness of breath, AFib EXAM: PORTABLE CHEST 1 VIEW COMPARISON:  Chest radiograph 06/08/2020 FINDINGS: The heart is enlarged, unchanged. The upper mediastinal contours are within normal limits. There is vascular congestion and probable mild pulmonary interstitial edema. There are trace bilateral pleural effusions. There is no pneumothorax There is no acute osseous abnormality. IMPRESSION: Cardiomegaly with mild pulmonary interstitial edema and trace bilateral pleural effusions suggesting CHF. Electronically Signed   By: Valetta Mole M.D.   On: 02/17/2021 15:05     Medical Consultants:   None.   Subjective:    Stephanie Woodward is unresponsive.  Objective:    Vitals:   02/18/21 1934 02/18/21 2007 02/18/21 2320 02/19/21 0524  BP: (!) 103/46  (!) 116/32 (!) 100/46  Pulse: 77  76 78  Resp: 18   18  Temp: 99.9 F (37.7 C)  98.9 F (37.2 C)   TempSrc: Oral  Oral   SpO2: 100% 100% 97% 98%  Weight:    89.6 kg  Height:       SpO2: 98 % O2 Flow Rate (L/min): 2 L/min   Intake/Output Summary (Last 24 hours) at 02/19/2021 0725 Last data filed at 02/18/2021 2030 Gross per 24 hour  Intake 598 ml  Output 690 ml  Net -92 ml    Filed Weights   02/17/21 1407 02/18/21 1630 02/19/21 0524  Weight: 89.8 kg 88.4 kg 89.6 kg    Exam: General exam: In no acute distress. Respiratory system: Good air movement and clear to auscultation. Cardiovascular system: S1 & S2 heard, RRR. No JVD. Gastrointestinal system: Abdomen is nondistended, soft and nontender.  Central nervous system: Only responsive  to [place] stimuli moving all 4 extremities Extremities: No pedal edema. Skin: No rashes, lesions or ulcers  Data Reviewed:    Labs: Basic Metabolic Panel: Recent Labs  Lab 02/17/21 1420 02/18/21 0516 02/19/21 0335  NA 141 140 136  K 3.3* 3.6 4.2  CL 95* 96* 97*  CO2 32 33* 27  GLUCOSE 205* 187* 216*  BUN 22 26* 19  CREATININE 4.42* 5.77* 5.07*  CALCIUM 8.1* 8.1* 8.7*  PHOS  --   --  4.2    GFR Estimated Creatinine Clearance: 9.7 mL/min (A) (by C-G formula based on SCr of 5.07 mg/dL (H)). Liver Function Tests: Recent Labs  Lab 02/18/21 1555 02/19/21 0335  AST 18  --   ALT 18  --   ALKPHOS 98  --   BILITOT 0.5  --   PROT 7.2  --   ALBUMIN 2.9* 2.7*   No results for input(s): LIPASE, AMYLASE in the last 168 hours. No results for input(s): AMMONIA in the last 168  hours. Coagulation profile No results for input(s): INR, PROTIME in the last 168 hours. COVID-19 Labs  No results for input(s): DDIMER, FERRITIN, LDH, CRP in the last 72 hours.  Lab Results  Component Value Date   SARSCOV2NAA NEGATIVE 02/17/2021   SARSCOV2NAA POSITIVE (A) 03/25/2020   SARSCOV2NAA NEGATIVE 08/03/2019   [place] NEGATIVE 06/12/2019    CBC: Recent Labs  Lab 02/17/21 1420 02/18/21 0516 02/19/21 0335  WBC 13.7* 11.5* 14.7*  NEUTROABS  --   --  12.0*  HGB 11.9* 10.1* 11.0*  HCT 37.2 32.9* 36.3  MCV 96.9 100.3* 100.8*  PLT 300 288 338    Cardiac Enzymes: No results for input(s): CKTOTAL, CKMB, CKMBINDEX, TROPONINI in the last 168 hours. BNP (last 3 results) No results for input(s): PROBNP in the last 8760 hours. CBG: Recent Labs  Lab 02/17/21 2215 02/18/21 0744 02/18/21 1619 02/18/21 2125 02/19/21 0621  GLUCAP 175* 131* 118* 239* 227*    D-Dimer: No results for input(s): DDIMER in the last 72 hours. Hgb A1c: Recent Labs    02/18/21 0516  HGBA1C 9.1*    Lipid Profile: No results for input(s): CHOL, HDL, LDLCALC, TRIG, CHOLHDL, LDLDIRECT in the last 72  hours. Thyroid function studies: Recent Labs    02/18/21 1555  TSH 1.546   Anemia work up: No results for input(s): VITAMINB12, FOLATE, FERRITIN, TIBC, IRON, RETICCTPCT in the last 72 hours. Sepsis Labs: Recent Labs  Lab 02/17/21 1420 02/18/21 0516 02/19/21 0335  WBC 13.7* 11.5* 14.7*    Microbiology Recent Results (from the past 240 hour(s))  Resp Panel by RT-PCR (Flu A&B, Covid) Nasopharyngeal Swab     Status: None   Collection Time: 02/17/21  7:27 PM   Specimen: Nasopharyngeal Swab; Nasopharyngeal(NP) swabs in vial transport medium  Result Value Ref Range Status   SARS Coronavirus 2 by RT PCR NEGATIVE NEGATIVE Final    Comment: (NOTE) SARS-CoV-2 target nucleic acids are NOT DETECTED.  The SARS-CoV-2 RNA is generally detectable in upper respiratory specimens during the acute phase of infection. The lowest concentration of SARS-CoV-2 viral copies this assay can detect is 138 copies/mL. A negative result does not preclude SARS-Cov-2 infection and should not be used as the sole basis for treatment or other patient management decisions. A negative result may occur with  improper specimen collection/handling, submission of specimen other than nasopharyngeal swab, presence of viral mutation(s) within the areas targeted by this assay, and inadequate number of viral copies(<138 copies/mL). A negative result must be combined with clinical observations, patient history, and epidemiological information. The expected result is Negative.  Fact Sheet for Patients:  EntrepreneurPulse.com.au  Fact Sheet for Healthcare Providers:  IncredibleEmployment.be  This test is no t yet approved or cleared by the [place] FDA and  has been authorized for detection and/or diagnosis of SARS-CoV-2 by FDA under an Emergency Use Authorization (EUA). This EUA will remain  in effect (meaning this test can be used) for the duration of the COVID-19 declaration  under Section 564(b)(1) of the Act, 21 U.S.C.section 360bbb-3(b)(1), unless the authorization is terminated  or revoked sooner.       Influenza A by PCR NEGATIVE NEGATIVE Final   Influenza B by PCR NEGATIVE NEGATIVE Final    Comment: (NOTE) The Xpert Xpress SARS-CoV-2/FLU/RSV plus assay is intended as an aid in the diagnosis of influenza from Nasopharyngeal swab specimens and should not be used as a sole basis for treatment. Nasal washings and aspirates are unacceptable for Xpert Xpress SARS-CoV-2/FLU/RSV testing.  Fact  Sheet for Patients: EntrepreneurPulse.com.au  Fact Sheet for Healthcare Providers: IncredibleEmployment.be  This test is not yet approved or cleared by the [place] FDA and has been authorized for detection and/or diagnosis of SARS-CoV-2 by FDA under an Emergency Use Authorization (EUA). This EUA will remain in effect (meaning this test can be used) for the duration of the COVID-19 declaration under Section 564(b)(1) of the Act, 21 U.S.C. section 360bbb-3(b)(1), unless the authorization is terminated or revoked.  Performed at [facility], [place] [address], [place], [place] [address]      Medications:    amiodarone  200 mg Oral BID   apixaban  5 mg Oral BID   atorvastatin  80 mg Oral QPM   Chlorhexidine Gluconate Cloth  6 each Topical Q0600   insulin aspart  0-5 Units Subcutaneous QHS   insulin aspart  0-6 Units Subcutaneous TID WC   latanoprost  1 drop Both Eyes QHS   metoprolol succinate  25 mg Oral BID   mirtazapine  15 mg Oral QHS   mometasone-formoterol  2 puff Inhalation BID   pantoprazole  40 mg Oral QPM   sodium chloride flush  3 mL Intravenous Q12H   Continuous Infusions:    LOS: 0 days   Charlynne Cousins  [facility]  02/19/2021, 7:25 AM

## 2021-02-19 NOTE — Progress Notes (Signed)
ABG attempted 2x unsuccessfully. Another RT will attempt to obtain specimen.

## 2021-02-19 NOTE — Progress Notes (Signed)
RT NOTES: ABG obtained and sent to lab. Lab tech notified.  

## 2021-02-20 DIAGNOSIS — I5033 Acute on chronic diastolic (congestive) heart failure: Secondary | ICD-10-CM | POA: Diagnosis not present

## 2021-02-20 DIAGNOSIS — I480 Paroxysmal atrial fibrillation: Secondary | ICD-10-CM | POA: Diagnosis not present

## 2021-02-20 DIAGNOSIS — J9602 Acute respiratory failure with hypercapnia: Secondary | ICD-10-CM | POA: Diagnosis not present

## 2021-02-20 LAB — RENAL FUNCTION PANEL
Albumin: 2.4 g/dL — ABNORMAL LOW (ref 3.5–5.0)
Albumin: 2.4 g/dL — ABNORMAL LOW (ref 3.5–5.0)
Anion gap: 13 (ref 5–15)
Anion gap: 15 (ref 5–15)
BUN: 33 mg/dL — ABNORMAL HIGH (ref 8–23)
BUN: 22 mg/dL (ref 8–23)
CO2: 23 mmol/L (ref 22–32)
CO2: 24 mmol/L (ref 22–32)
Calcium: 8.4 mg/dL — ABNORMAL LOW (ref 8.9–10.3)
Calcium: 8.1 mg/dL — ABNORMAL LOW (ref 8.9–10.3)
Chloride: 97 mmol/L — ABNORMAL LOW (ref 98–111)
Chloride: 98 mmol/L (ref 98–111)
Creatinine, Ser: 6.63 mg/dL — ABNORMAL HIGH (ref 0.44–1.00)
Creatinine, Ser: 4.77 mg/dL — ABNORMAL HIGH (ref 0.44–1.00)
GFR, Estimated: 6 mL/min — ABNORMAL LOW (ref >60)
GFR, Estimated: 9 mL/min — ABNORMAL LOW (ref >60)
Glucose, Bld: 189 mg/dL — ABNORMAL HIGH (ref 70–99)
Glucose, Bld: 114 mg/dL — ABNORMAL HIGH (ref 70–99)
Phosphorus: 5.9 mg/dL — ABNORMAL HIGH (ref 2.5–4.6)
Phosphorus: 4 mg/dL (ref 2.5–4.6)
Potassium: 3.8 mmol/L (ref 3.5–5.1)
Potassium: 3.9 mmol/L (ref 3.5–5.1)
Sodium: 133 mmol/L — ABNORMAL LOW (ref 135–145)
Sodium: 137 mmol/L (ref 135–145)

## 2021-02-20 LAB — COOXEMETRY PANEL
Carboxyhemoglobin: 0.6 % (ref 0.5–1.5)
Methemoglobin: 0.7 % (ref 0.0–1.5)
O2 Saturation: 73.7 %
Total hemoglobin: 13.1 g/dL (ref 12.0–16.0)

## 2021-02-20 LAB — ECHOCARDIOGRAM COMPLETE
AR max vel: 2.32 cm2
AV Area VTI: 2.32 cm2
AV Area mean vel: 2.26 cm2
AV Mean grad: 2 mmHg
AV Peak grad: 4.8 mmHg
Ao pk vel: 1.09 m/s
Area-P 1/2: 3.85 cm2
Height: 63 in
MV VTI: 1.96 cm2
Weight: 3160.51 oz

## 2021-02-20 LAB — TROPONIN I (HIGH SENSITIVITY)
Troponin I (High Sensitivity): 385 ng/L (ref <18)
Troponin I (High Sensitivity): 378 ng/L (ref <18)

## 2021-02-20 LAB — CBC
HCT: 36.3 % (ref 36.0–46.0)
Hemoglobin: 12 g/dL (ref 12.0–15.0)
MCH: 31.3 pg (ref 26.0–34.0)
MCHC: 33.1 g/dL (ref 30.0–36.0)
MCV: 94.8 fL (ref 80.0–100.0)
Platelets: 294 10*3/uL (ref 150–400)
RBC: 3.83 MIL/uL — ABNORMAL LOW (ref 3.87–5.11)
RDW: 13.3 % (ref 11.5–15.5)
WBC: 16.6 10*3/uL — ABNORMAL HIGH (ref 4.0–10.5)
nRBC: 0 % (ref 0.0–0.2)

## 2021-02-20 LAB — TRIGLYCERIDES: Triglycerides: 107 mg/dL (ref <150)

## 2021-02-20 LAB — GLUCOSE, CAPILLARY
Glucose-Capillary: 113 mg/dL — ABNORMAL HIGH (ref 70–99)
Glucose-Capillary: 122 mg/dL — ABNORMAL HIGH (ref 70–99)
Glucose-Capillary: 137 mg/dL — ABNORMAL HIGH (ref 70–99)
Glucose-Capillary: 146 mg/dL — ABNORMAL HIGH (ref 70–99)
Glucose-Capillary: 154 mg/dL — ABNORMAL HIGH (ref 70–99)
Glucose-Capillary: 198 mg/dL — ABNORMAL HIGH (ref 70–99)

## 2021-02-20 LAB — APTT
aPTT: 49 seconds — ABNORMAL HIGH (ref 24–36)
aPTT: 61 seconds — ABNORMAL HIGH (ref 24–36)

## 2021-02-20 LAB — MAGNESIUM: Magnesium: 2.1 mg/dL (ref 1.7–2.4)

## 2021-02-20 LAB — HEPARIN LEVEL (UNFRACTIONATED): Heparin Unfractionated: >1.1 IU/mL — ABNORMAL HIGH (ref 0.30–0.70)

## 2021-02-20 MED ORDER — LIDOCAINE 5 % EX PTCH
1.0000 | MEDICATED_PATCH | CUTANEOUS | Status: DC
Start: 2021-02-20 — End: 2021-03-05
  Administered 2021-02-20 – 2021-03-04 (×13): 1 via TRANSDERMAL
  Filled 2021-02-20 (×13): qty 1

## 2021-02-20 MED ORDER — KETOROLAC TROMETHAMINE 30 MG/ML IJ SOLN
30.0000 mg | Freq: Once | INTRAMUSCULAR | Status: AC
  Administered 2021-02-20: 12:00:00 30 mg via INTRAVENOUS
  Filled 2021-02-20: qty 1

## 2021-02-20 MED ORDER — ACETAMINOPHEN 325 MG PO TABS: 650.0000 mg | ORAL_TABLET | Freq: Four times a day (QID) | ORAL | Status: DC | PRN

## 2021-02-20 MED ORDER — POLYETHYLENE GLYCOL 3350 17 G PO PACK: 17.0000 g | PACK | Freq: Every day | ORAL | Status: DC

## 2021-02-20 MED ORDER — ONDANSETRON HCL 4 MG PO TABS: 4.0000 mg | ORAL_TABLET | Freq: Four times a day (QID) | ORAL | Status: DC | PRN

## 2021-02-20 MED ORDER — ATORVASTATIN CALCIUM 80 MG PO TABS
80.0000 mg | ORAL_TABLET | Freq: Every evening | ORAL | Status: DC
  Administered 2021-02-20 – 2021-03-04 (×11): 80 mg via ORAL
  Filled 2021-02-20 (×13): qty 1

## 2021-02-20 MED ORDER — ONDANSETRON HCL 4 MG/2ML IJ SOLN: 4.0000 mg | Freq: Four times a day (QID) | INTRAMUSCULAR | Status: DC | PRN

## 2021-02-20 MED ORDER — DOCUSATE SODIUM 50 MG/5ML PO LIQD
100.0000 mg | Freq: Two times a day (BID) | ORAL | Status: DC
  Filled 2021-02-20 (×3): qty 10

## 2021-02-20 MED ORDER — SENNOSIDES-DOCUSATE SODIUM 8.6-50 MG PO TABS: 1.0000 | ORAL_TABLET | Freq: Every evening | ORAL | Status: DC | PRN

## 2021-02-20 MED ORDER — RENA-VITE PO TABS
1.0000 | ORAL_TABLET | Freq: Every day | ORAL | Status: DC
  Administered 2021-02-20 – 2021-03-04 (×13): 1 via ORAL
  Filled 2021-02-20 (×13): qty 1

## 2021-02-20 NOTE — Procedures (Signed)
Extubation Procedure Note  Patient Details:   Name: Stephanie Woodward DOB: Aug 28, 1940 MRN: [id number]   Airway Documentation:    Vent end date: 02/20/21 Vent end time: 1004   Evaluation  O2 sats: stable throughout Complications: No apparent complications Patient did tolerate procedure well. Bilateral Breath Sounds: Clear, Diminished   Yes  Jesly Hartmann 02/20/2021, 10:05 AM

## 2021-02-20 NOTE — Progress Notes (Signed)
[place] for apixaban>heparin infusion Indication: atrial fibrillation  Allergies  Allergen Reactions   Eggs Or Egg-Derived Products Nausea And Vomiting   Lisinopril Nausea And Vomiting   Penicillins Nausea And Vomiting    Has patient had a PCN reaction causing immediate rash, facial/tongue/throat swelling, SOB or lightheadedness with hypotension: No Has patient had a PCN reaction causing severe rash involving mucus membranes or skin necrosis: No Has patient had a PCN reaction that required hospitalization: No Has patient had a PCN reaction occurring within the last 10 years: No If all of the above answers are "NO", then may proceed with Cephalosporin use.    Other Other (See Comments)    Patient Measurements: Height: '5\' 3"'  (160 cm) Weight: 89.1 kg (196 lb 6.9 oz) IBW/kg (Calculated) : 52.4 Heparin Dosing Weight: 72.7 kg   Vital Signs: Temp: 98.4 F (36.9 C) (12/30 1141) Temp Source: Oral (12/30 1141) BP: 130/60 (12/30 1745) Pulse Rate: 90 (12/30 1745)  Labs: Recent Labs    02/19/21 0335 02/19/21 1052 02/19/21 1436 02/19/21 1841 02/20/21 0433 02/20/21 1009 02/20/21 1113 02/20/21 1524 02/20/21 1712  HGB 11.0* 10.9* 11.2*  --   --  12.0  --   --   --   HCT 36.3 33.6* 33.0*  --   --  36.3  --   --   --   PLT 338 271  --   --   --  294  --   --   --   APTT  --   --   --  119* 49*  --   --   --  61*  HEPARINUNFRC  --   --   --  >1.10* >1.10*  --   --   --   --   CREATININE 5.07* 5.78*  --   --  6.63*  --   --  4.77*  --   TROPONINIHS  --   --   --   --   --  385* 378*  --   --      Estimated Creatinine Clearance: 10.3 mL/min (A) (by C-G formula based on SCr of 4.77 mg/dL (H)).   Medical History: Past Medical History:  Diagnosis Date   Arthritis    Asthma    Atrial fibrillation (HCC)    CHF (congestive heart failure) (HCC)    CKD (chronic kidney disease), stage III (HCC)    COPD (chronic obstructive pulmonary disease)  (HCC)    Diabetes mellitus    INSULIN DEPENDENT   Gout    Heart murmur    no issues per pt   History of kidney stones    Hyperlipemia    Hypertension    Psoriasis    Renal disorder    congenital   Single kidney    Sleep apnea    doesn't use the Cpap    Medications:  Scheduled:   arformoterol  15 mcg Nebulization BID   atorvastatin  80 mg Oral QPM   budesonide (PULMICORT) nebulizer solution  0.5 mg Nebulization BID   chlorhexidine gluconate (MEDLINE KIT)  15 mL Mouth Rinse BID   Chlorhexidine Gluconate Cloth  6 each Topical Q0600   docusate  100 mg Oral BID   insulin aspart  0-9 Units Subcutaneous Q4H   latanoprost  1 drop Both Eyes QHS   lidocaine  1 patch Transdermal Q24H   mouth rinse  15 mL Mouth Rinse 10 times per day   multivitamin  1  tablet Oral QHS   pantoprazole (PROTONIX) IV  40 mg Intravenous Q24H   [START ON 02/21/2021] polyethylene glycol  17 g Oral Daily   sodium chloride flush  3 mL Intravenous Q12H    Assessment: 7 yof presenting with Afib - on apixaban PTA for hx Afib. Last dose on 12/28'@2245' .   Transferred from ICU due to unresponsiveness - now intubated. CT head showing no bleed but moderate chronic microvascular ischemia disease and severe L/moderate R intracranial ICA stenosis. Given recent DOAC, will need to monitor both aPTT and heparin level until correlate.   Repeat aPTT this afternoon remains below goal at 61 seconds.   Goal of Therapy:  Heparin level 0.3-0.7 units/ml aPTT 66-102 seconds Monitor platelets by anticoagulation protocol: Yes   Plan:  Increase heparin to 1050 units/hr Recheck heparin level and aPTT with am labs  Arrie Senate, PharmD, BCPS, [facility] Clinical Pharmacist [phone] Please check AMION for all [place] numbers 02/20/2021

## 2021-02-20 NOTE — Progress Notes (Signed)
RN stated to hold off on bipap for now on pt. Due to pt. Confusion and sedation. RN stated pt. Probably would not tolerate at this time. RT to reassess.

## 2021-02-20 NOTE — Progress Notes (Addendum)
Neurology Progress Note Stephanie Woodward MR# [id number] 02/20/2021   S: no overnight events; no new complaints. She was extubated this am and is on CRRT started today 12/30. She is awake and alert speaking normally.  O: Current vital signs: BP (!) 120/47    Pulse 74    Temp 98.4 F (36.9 C) (Oral)    Resp 17    Ht _0  (1.6 m)    Wt 89.1 kg    SpO2 100%    BMI 34.80 kg/m  Vital signs in last 24 hours: Temp:  [94 F (34.4 C)-98.4 F (36.9 C)] 98.4 F (36.9 C) (12/30 1141) Pulse Rate:  [37-80] 74 (12/30 1141) Resp:  [0-27] 17 (12/30 1141) BP: (97-177)/(31-71) 120/47 (12/30 1030) SpO2:  [96 %-100 %] 100 % (12/30 1030) FiO2 (%):  [40 %] 40 % (12/30 0946) Weight:  [89.1 kg] 89.1 kg (12/30 0500) GENERAL: Awake, alert in NAD HEENT: Normocephalic and atraumatic, moist mm, no LN++, no thyromegaly LUNGS: symmetric excursions bilaterally with no audible wheezes. CV: RR, equal pulses bilaterally. ABDOMEN: Soft, nontender, nondistended with normoactive BS Ext: warm, well perfused, intact peripheral pulses  NEURO:  Mental Status: AA&O Language: speech is fluent.  Intact naming and comprehension. PERR. EOMI, visual fields full, no facial asymmetry, facial sensation intact, hearing intact. No evidence of tongue atrophy or fibrillations, tongue/uvula/soft palate midline elevates symmetrically  Normal sternocleidomastoid and trapezius muscle strength. Motor: good strength in all extremities. Tone: Tone and bulk is normal Sensation: Intact to light touch bilaterally Gait - Deferred  NIHSS 0  Medications  Current Facility-Administered Medications:     prismasol BGK 4/2.5 infusion, , CRRT, Continuous, Roney Jaffe, MD, Last Rate: 400 mL/hr at 02/20/21 0935, New Bag at 02/20/21 0935    prismasol BGK 4/2.5 infusion, , CRRT, Continuous, Roney Jaffe, MD, Last Rate: 200 mL/hr at 02/20/21 0936, New Bag at 02/20/21 0936   0.9 %  sodium chloride infusion, , Intravenous, PRN, Candee Furbish,  MD, Stopped at 02/20/21 [phone]   0.9 %  sodium chloride infusion, 250 mL, Intravenous, Continuous, Candee Furbish, MD   acetaminophen (TYLENOL) tablet 650 mg, 650 mg, Per Tube, Q6H PRN, Candee Furbish, MD   alteplase (CATHFLO ACTIVASE) injection 2 mg, 2 mg, Intracatheter, Once PRN, Roney Jaffe, MD   arformoterol [facility]) nebulizer solution 15 mcg, 15 mcg, Nebulization, BID, Jennelle Human B, NP, 15 mcg at 02/20/21 0719   atorvastatin (LIPITOR) tablet 80 mg, 80 mg, Per Tube, QPM, Olalere, Adewale A, MD, 80 mg at 02/19/21 1731   budesonide (PULMICORT) nebulizer solution 0.5 mg, 0.5 mg, Nebulization, BID, Jennelle Human B, NP, 0.5 mg at 02/20/21 0719   chlorhexidine gluconate (MEDLINE KIT) (PERIDEX) 0.12 % solution 15 mL, 15 mL, Mouth Rinse, BID, Candee Furbish, MD, 15 mL at 02/20/21 1023   Chlorhexidine Gluconate Cloth 2 % PADS 6 each, 6 each, Topical, Q0600, Charlynne Cousins, MD, 6 each at 02/19/21 0830   docusate (COLACE) 50 MG/5ML liquid 100 mg, 100 mg, Per Tube, BID, Candee Furbish, MD, 100 mg at 02/19/21 2307   heparin ADULT infusion 100 units/mL (25000 units/231m), 950 Units/hr, Intravenous, Continuous, Olalere, Adewale A, MD, Last Rate: 9.5 mL/hr at 02/20/21 1200, 950 Units/hr at 02/20/21 1200   heparin injection 1,000-6,000 Units, 1,000-6,000 Units, CRRT, PRN, BEinar Grad RPH, 3,000 Units at 02/19/21 1740   hydrALAZINE (APRESOLINE) injection 10 mg, 10 mg, Intravenous, Q4H PRN, FJoette Catching PA-C   insulin aspart (novoLOG) injection 0-9 Units,  0-9 Units, Subcutaneous, Q4H, Jennelle Human B, NP, 2 Units at 02/20/21 1208   ipratropium-albuterol (DUONEB) 0.5-2.5 (3) MG/3ML nebulizer solution 3 mL, 3 mL, Nebulization, Q4H PRN, Jennelle Human B, NP   latanoprost (XALATAN) 0.005 % ophthalmic solution 1 drop, 1 drop, Both Eyes, QHS, Charlynne Cousins, MD, 1 drop at 02/19/21 2336   MEDLINE mouth rinse, 15 mL, Mouth Rinse, 10 times per day, Candee Furbish, MD, 15 mL at  02/20/21 1216   multivitamin (RENA-VIT) tablet 1 tablet, 1 tablet, Per Tube, QHS, Olalere, Adewale A, MD, 1 tablet at 02/19/21 2307   norepinephrine (LEVOPHED) 37m in 2564m(0.016 mg/mL) premix infusion, 2-10 mcg/min, Intravenous, Titrated, SmCandee FurbishMD, Stopped at 02/20/21 0956   ondansetron (ZOFRAN) tablet 4 mg, 4 mg, Per Tube, Q6H PRN **OR** ondansetron (ZOFRAN) injection 4 mg, 4 mg, Intravenous, Q6H PRN, Olalere, Adewale A, MD   pantoprazole (PROTONIX) injection 40 mg, 40 mg, Intravenous, Q24H, Simpson, Paula B, NP, 40 mg at 02/20/21 1022   piperacillin-tazobactam (ZOSYN) IVPB 3.375 g, 3.375 g, Intravenous, Q6H, BiEinar GradRPH, Last Rate: 100 mL/hr at 02/20/21 1202, 3.375 g at 02/20/21 1202   polyethylene glycol (MIRALAX / GLYCOLAX) packet 17 g, 17 g, Per Tube, Daily, SmCandee FurbishMD, 17 g at 02/19/21 1101   prismasol BGK 4/2.5 infusion, , CRRT, Continuous, ScRoney JaffeMD, Last Rate: 1,800 mL/hr at 02/20/21 1219, New Bag at 02/20/21 1219   senna-docusate (Senokot-S) tablet 1 tablet, 1 tablet, Per Tube, QHS PRN, Olalere, Adewale A, MD   sodium chloride 0.9 % primer fluid for CRRT, , CRRT, PRN, ScRoney JaffeMD   sodium chloride flush (NS) 0.9 % injection 3 mL, 3 mL, Intravenous, Q12H, FeCharlynne CousinsMD, 3 mL at 02/20/21 1025   vancomycin (VANCOCIN) IVPB 1000 mg/200 mL premix, 1,000 mg, Intravenous, Q24H, BiEinar GradPPrairieville Family Hospitalabs     Component Value Date/Time   WBC 16.6 (H) 02/20/2021 1009   RBC 3.83 (L) 02/20/2021 1009   HGB 12.0 02/20/2021 1009   HCT 36.3 02/20/2021 1009   PLT 294 02/20/2021 1009   MCV 94.8 02/20/2021 1009   MCH 31.3 02/20/2021 1009   MCHC 33.1 02/20/2021 1009   RDW 13.3 02/20/2021 1009   LYMPHSABS 0.8 02/19/2021 1052   MONOABS 0.6 02/19/2021 1052   EOSABS 0.0 02/19/2021 1052   BASOSABS 0.0 02/19/2021 1052       Component Value Date/Time   NA 133 (L) 02/20/2021 0433   K 3.8 02/20/2021 0433   CL 97 (L) 02/20/2021 0433    CO2 23 02/20/2021 0433   GLUCOSE 189 (H) 02/20/2021 0433   BUN 33 (H) 02/20/2021 0433   CREATININE 6.63 (H) 02/20/2021 0433   CALCIUM 8.4 (L) 02/20/2021 0433   PROT 6.3 (L) 02/19/2021 1052   ALBUMIN 2.4 (L) 02/20/2021 0433   AST 88 (H) 02/19/2021 1052   ALT 52 (H) 02/19/2021 1052   ALKPHOS 116 02/19/2021 1052   BILITOT 1.1 02/19/2021 1052   GFRNONAA 6 (L) 02/20/2021 0433   GFRAA 9 (L) 10/24/2019 1607       Component Value Date/Time   CHOL 183 07/16/2020 1123   TRIG 107 02/20/2021 0433   HDL 56 07/16/2020 1123   CHOLHDL 3.3 07/16/2020 1123   VLDL 31 07/16/2020 1123   LDLCALC 96 07/16/2020 1123     Imaging I have reviewed images in epic and the results pertinent to this consultation are: CT Head showed  MRI Brain showed showed no  acute intracranial abnormality. Generalized volume loss and findings of chronic small vessel Ischemic disease.  Assessment:  80 yo female with OSA not on Bipap, HTN, HLD, pulmonary HTN, AFib (new dx), sedentary lifestyle, and IDDM 2 found unresponsive. MRI was negative for acute ischemic changes, hemorrhage, mass. She is now extubated and on CRRT and awake, alert and interacting appropriately.  Impression: Acute metabolic encephalopathy.  Recommendations: Continue current management correcting metabolic derangements. Please call for questions.   This patient is critically ill and at significant risk of neurological worsening, death and care requires constant monitoring of vital signs, hemodynamics,respiratory and cardiac monitoring, neurological assessment, discussion with family, other specialists and medical decision making of high complexity. I spent 33 minutes of neurocritical care time  in the care of  this patient. This was time spent independent of any time provided by nurse practitioner or PA.  Electronically signed by:  Lynnae Sandhoff, MD Page: [phone] 02/20/2021, 12:41 PM

## 2021-02-20 NOTE — Evaluation (Signed)
Clinical/Bedside Swallow Evaluation Patient Details  Name: Stephanie Woodward MRN: [id number] Date of Birth: Nov 15, 1940  Today's Date: 02/20/2021 Time: SLP Start Time (ACUTE ONLY): 69 SLP Stop Time (ACUTE ONLY): 1313 SLP Time Calculation (min) (ACUTE ONLY): 15 min  Past Medical History:  Past Medical History:  Diagnosis Date   Arthritis    Asthma    Atrial fibrillation (HCC)    CHF (congestive heart failure) (HCC)    CKD (chronic kidney disease), stage III (HCC)    COPD (chronic obstructive pulmonary disease) (HCC)    Diabetes mellitus    INSULIN DEPENDENT   Gout    Heart murmur    no issues per pt   History of kidney stones    Hyperlipemia    Hypertension    Psoriasis    Renal disorder    congenital   Single kidney    Sleep apnea    doesn't use the Cpap   Past Surgical History:  Past Surgical History:  Procedure Laterality Date   A/V FISTULAGRAM Left 10/31/2020   Procedure: A/V FISTULAGRAM;  Surgeon: Angelia Mould, MD;  Location: [facility];  Service: Cardiovascular;  Laterality: Left;   ABDOMINAL HYSTERECTOMY     AV FISTULA PLACEMENT Left 11/17/2018   Procedure: BRACHIO-CEPHALIC ARTERIOVENOUS (AV) FISTULA CREATION IN LEFT ARM;  Surgeon: Marty Heck, MD;  Location: [place];  Service: Vascular;  Laterality: Left;   CHOLECYSTECTOMY     INSERTION OF DIALYSIS CATHETER Right 01/26/2019   Procedure: INSERTION OF DIALYSIS CATHETER, right internal jugular;  Surgeon: Angelia Mould, MD;  Location: [place];  Service: Vascular;  Laterality: Right;   LEFT AND RIGHT HEART CATHETERIZATION WITH CORONARY ANGIOGRAM N/A 11/29/2013   Procedure: LEFT AND RIGHT HEART CATHETERIZATION WITH CORONARY ANGIOGRAM;  Surgeon: Jacolyn Reedy, MD;  Location: [facility];  Service: Cardiovascular;  Laterality: N/A;   PERIPHERAL VASCULAR INTERVENTION Left 10/31/2020   Procedure: PERIPHERAL VASCULAR INTERVENTION;  Surgeon: Angelia Mould, MD;  Location: [place] [facility];   Service: Cardiovascular;  Laterality: Left;   RIGHT HEART CATH N/A 11/23/2017   Procedure: RIGHT HEART CATH;  Surgeon: Larey Dresser, MD;  Location: [place] [facility];  Service: Cardiovascular;  Laterality: N/A;   RIGHT/LEFT HEART CATH AND CORONARY ANGIOGRAPHY N/A 06/15/2019   Procedure: RIGHT/LEFT HEART CATH AND CORONARY ANGIOGRAPHY;  Surgeon: Larey Dresser, MD;  Location: [place] [facility];  Service: Cardiovascular;  Laterality: N/A;   HPI:  COPD, former smoker (quit 15 yrs ago) mild pulmonary hypertension, ESRD, PAF on Eliquis, HFpEF, IDDM DMT2, obesity    Assessment / Plan / Recommendation  Clinical Impression  Pt presents with signs of dysphagia with likely exacerbation of symptoms due to current mentation. Vocal quality is adequate s/p brief intubation, but she is quite distractible, has trouble answering questions, and does not consistently follow commands. Across PO trials there is oral holding, and when she swallows she typically has at least two swallows per bolus. Wet vocal quality is noted after thin liquids in particularly, for which she needs cues to cough to clear her voice. She will likely need instrumental testing as her mentation improves considering the difficulties she was having PTA as well. She would not be able to go to radiology while on CRRT, but could consider FEES at bedside sooner. For today, would offer meds crushed in puree. SLP Visit Diagnosis: Dysphagia, unspecified (R13.10)    Aspiration Risk  Moderate aspiration risk    Diet Recommendation NPO except  meds   Medication Administration: Crushed with puree    Other  Recommendations Oral Care Recommendations: Oral care QID Other Recommendations: Have oral suction available    Recommendations for follow up therapy are one component of a multi-disciplinary discharge planning process, led by the attending physician.  Recommendations may be updated based on patient status, additional functional criteria and  insurance authorization.  Follow up Recommendations  (tba)      Assistance Recommended at Discharge Intermittent Supervision/Assistance  Functional Status Assessment Patient has had a recent decline in their functional status and demonstrates the ability to make significant improvements in function in a reasonable and predictable amount of time.  Frequency and Duration min 2x/week  2 weeks       Prognosis Prognosis for Safe Diet Advancement: Good Barriers to Reach Goals: Cognitive deficits      Swallow Study   General HPI: COPD, former smoker (quit 15 yrs ago) mild pulmonary hypertension, ESRD, PAF on Eliquis, HFpEF, IDDM DMT2, obesity Type of Study: Bedside Swallow Evaluation Previous Swallow Assessment: none in chart Diet Prior to this Study: NPO Temperature Spikes Noted: Yes (100.7) Respiratory Status: Nasal cannula History of Recent Intubation: Yes Length of Intubations (days): 1 days Date extubated: 02/20/21 Behavior/Cognition: Alert;Confused;Requires cueing;Distractible Oral Cavity Assessment: Within Functional Limits Oral Care Completed by SLP: No Oral Cavity - Dentition: Edentulous Vision: Functional for self-feeding Self-Feeding Abilities: Needs assist Patient Positioning: Upright in bed Baseline Vocal Quality: Normal Volitional Cough: Cognitively unable to elicit Volitional Swallow: Able to elicit    Oral/Motor/Sensory Function Overall Oral Motor/Sensory Function:  (difficulty following commands to assess, but appears to be symmetrical)   Ice Chips Ice chips: Not tested   Thin Liquid Thin Liquid: Impaired Presentation: Spoon;Straw Oral Phase Functional Implications: Oral holding Pharyngeal  Phase Impairments: Wet Vocal Quality;Multiple swallows    Nectar Thick Nectar Thick Liquid: Not tested   Honey Thick Honey Thick Liquid: Not tested   Puree Puree: Impaired Presentation: Spoon Oral Phase Functional Implications: Oral holding Pharyngeal Phase Impairments:  Multiple swallows   Solid            Osie Bond., M.A. [facility] Pager [phone] Office [phone]  02/20/2021,2:58 PM

## 2021-02-20 NOTE — Progress Notes (Signed)
NAME:  Stephanie Woodward, MRN:  [id number], DOB:  December 01, 1940, LOS: 1 ADMISSION DATE:  02/17/2021, CONSULTATION DATE:  02/19/2021 REFERRING MD:  Dr. Olevia Bowens, CHIEF COMPLAINT:  AMS   History of Present Illness:   80 year old female with prior history of COPD, mild pulmonary hypertension, ESRD TTS, PAF on Eliquis, HFpEF, IDDM DMT2, and obesity who presented on 1227 from dialysis with shortness of breath and palpitations.  She did not receive full dialysis treatment, last full treatment on 12/24 at her EDW.  Prior hospitalization 1 week ago at [facility] found to be in A. fib with RVR and placed on Eliquis at that time.  Plans for DCCV however she self converted.  No reports of prior fever, cough, nausea, or vomiting.    Found in ER afebrile, normotensive, and normal oxygenation but in A. fib with RVR in which cardiology was consulted and she was cardioverted back to normal sinus.  She was admitted to [facility], nephrology consulted and underwent iHD on 12/28.  She did have some brief hypotension at the end of iHD but recovered.  Last seen at baseline around 0300 on 12/29.  Around 0730, she was found to be minimally unresponsive with concern for airway protection.  Rapid response at bedside, glucose 206, and no new or sedating meds other than her home remeron last night.  She remains in NSR and normotensive with good O2 saturations.  She was placed on BIPAP, ABG showing 7.283/ 60/ 92/ 27.5.  PCCM consulted and patient transferred to ICU.    Of note, speaking with patient's two daughters, they report patient has been having episodes with difficulty swallowing/ choking episodes at least for two weeks and patient complained of hand tremors 12/28.   Pertinent  Medical History  COPD, former smoker (quit 15 yrs ago) mild pulmonary hypertension, ESRD, PAF on Eliquis, HFpEF, IDDM DMT2, obesity  Significant Hospital Events: Including procedures, antibiotic start and stop dates in addition to other pertinent  events   12/27 Admitted [facility], cards/ nephrology consulted, cardioverted,  12/28 iHD 12/29 unresponsive, LNW 0300, intubated for airway protection, CT, MRI., EEG unremarkable  12/30 extubate  Interim History / Subjective:   MRI without acute abnormality to explain change in mental status 12/29  Objective   Blood pressure (!) 120/47, pulse 75, temperature 97.8 F (36.6 C), temperature source Axillary, resp. rate 17, height 5\' 3"  (1.6 m), weight 89.1 kg, SpO2 100 %.    Vent Mode: Stand-by FiO2 (%):  [40 %] 40 % Set Rate:  [12 bmp] 12 bmp Vt Set:  [420 mL] 420 mL PEEP:  [5 cmH20] 5 cmH20 Pressure Support:  [12 cmH20] 12 cmH20 Plateau Pressure:  [21 cmH20-24 cmH20] 21 cmH20   Intake/Output Summary (Last 24 hours) at 02/20/2021 1118 Last data filed at 02/20/2021 1100 Gross per 24 hour  Intake 1549.26 ml  Output 560 ml  Net 989.26 ml   Filed Weights   02/18/21 1630 02/19/21 0524 02/20/21 0500  Weight: 88.4 kg 89.6 kg 89.1 kg   Examination: General:  Obese older adult F intubated lightly sedate  HEENT: NCAT ETT secure anicteric sclera  Neuro:  Awake following commands  Psych: agitated psychomotor movements  CV: rr distant hear tsounds cap refill brisk  PULM:  Mechanically ventilated. Symmetrical chest expansion GI:  obese soft ndnt  Extremities: no acute deformity no cyanosis no clubbing. AVF Skin: c/d/w. Chronic BLE cutaneous changes   Resolved Hospital Problem list    Assessment & Plan:   Acute encephalopathy, improving -favor  metabolic, etiology still unclear. No identifiable intracranial process. ?sepsis but not convincing for this. Was mildly hypercarbic -- possibly related to hypercarbia, but seemed out of proportion P:  -Delirium precautions -minimize CNS depresisng meds   Acute respiratory insufficiency related to encephalopathy requiring MV Hx COPD P -extubate 12/30 -IS, mobility   Hx HFpEF with acutely reduced EF -? Stress CM r/t hypoxia. ECG without  evidence of acute MI.  -TTE 12/30 with EF about 30%  P:  -holding home meds  -HF consulted  -check coox  Afib RVR pHTN HTN HLD - DCCV on 12/27 - telemetry monitoring, has remained in NSR with few burst of RVR  P -holding home meds  -hep gtt -amio -tele monitoring   Leukocytosis -?infection, but Pct argues against  P -follow cx data -cont empiric abx for now   ESRD  Hyponatremia, suspect hypervolemic - mild P - CRRT per nephro  Suspected Dysphagia -SLP consult 12/30   DM2, insulin dependent  - SSI   Best Practice (right click and "Reselect all SmartList Selections" daily)   Diet/type: NPO DVT prophylaxis: hep gtt  GI prophylaxis: PPI Lines: Dialysis Catheter and Arterial Line Foley:  N/A Code Status:  full code Last date of multidisciplinary goals of care discussion: 12/30 daughters at bedside   Labs   CBC: Recent Labs  Lab 02/17/21 1420 02/18/21 0516 02/19/21 0335 02/19/21 1052 02/19/21 1436 02/20/21 1009  WBC 13.7* 11.5* 14.7* 13.0*  --  16.6*  NEUTROABS  --   --  12.0* 11.6*  --   --   HGB 11.9* 10.1* 11.0* 10.9* 11.2* 12.0  HCT 37.2 32.9* 36.3 33.6* 33.0* 36.3  MCV 96.9 100.3* 100.8* 96.6  --  94.8  PLT 300 288 338 271  --  622    Basic Metabolic Panel: Recent Labs  Lab 02/17/21 1420 02/18/21 0516 02/19/21 0335 02/19/21 1052 02/19/21 1436 02/20/21 0433  NA 141 140 136 133* 135 133*  K 3.3* 3.6 4.2 4.2 3.9 3.8  CL 95* 96* 97* 96*  --  97*  CO2 32 33* 27 24  --  23  GLUCOSE 205* 187* 216* 210*  --  189*  BUN 22 26* 19 26*  --  33*  CREATININE 4.42* 5.77* 5.07* 5.78*  --  6.63*  CALCIUM 8.1* 8.1* 8.7* 8.2*  --  8.4*  MG  --   --   --   --   --  2.1  PHOS  --   --  4.2  --   --  5.9*   GFR: Estimated Creatinine Clearance: 7.4 mL/min (A) (by C-G formula based on SCr of 6.63 mg/dL (H)). Recent Labs  Lab 02/18/21 0516 02/19/21 0335 02/19/21 0758 02/19/21 1052 02/20/21 1009  PROCALCITON  --   --  0.35  --   --   WBC 11.5*  14.7*  --  13.0* 16.6*    Liver Function Tests: Recent Labs  Lab 02/18/21 1555 02/19/21 0335 02/19/21 1052 02/20/21 0433  AST 18  --  88*  --   ALT 18  --  52*  --   ALKPHOS 98  --  116  --   BILITOT 0.5  --  1.1  --   PROT 7.2  --  6.3*  --   ALBUMIN 2.9* 2.7* 2.5* 2.4*   No results for input(s): LIPASE, AMYLASE in the last 168 hours. Recent Labs  Lab 02/19/21 1052  AMMONIA 15    ABG    Component Value Date/Time  PHART 7.517 (H) 02/19/2021 1436   PCO2ART 33.2 02/19/2021 1436   PO2ART 168 (H) 02/19/2021 1436   HCO3 26.7 02/19/2021 1436   TCO2 28 02/19/2021 1436   O2SAT 73.7 02/20/2021 1011     Coagulation Profile: No results for input(s): INR, PROTIME in the last 168 hours.  Cardiac Enzymes: No results for input(s): CKTOTAL, CKMB, CKMBINDEX, TROPONINI in the last 168 hours.  HbA1C: Hemoglobin A1C  Date/Time Value Ref Range Status  08/08/2020 10:15 AM 8.3 (A) 4.0 - 5.6 % Final  02/06/2020 11:12 AM 8.2 (A) 4.0 - 5.6 % Final   Hgb A1c MFr Bld  Date/Time Value Ref Range Status  02/18/2021 05:16 AM 9.1 (H) 4.8 - 5.6 % Final    Comment:    (NOTE) Pre diabetes:          5.7%-6.4%  Diabetes:              >6.4%  Glycemic control for   <7.0% adults with diabetes   01/24/2019 05:29 PM 8.9 (H) 4.8 - 5.6 % Final    Comment:    (NOTE) Pre diabetes:          5.7%-6.4% Diabetes:              >6.4% Glycemic control for   <7.0% adults with diabetes     CBG: Recent Labs  Lab 02/19/21 1440 02/19/21 1947 02/19/21 2300 02/20/21 0336 02/20/21 0801  GLUCAP 204* 163* 160* 198* 146*    CRITICAL CARE Performed by: Cristal Generous   Total critical care time: 46 minutes  Critical care time was exclusive of separately billable procedures and treating other patients. Critical care was necessary to treat or prevent imminent or life-threatening deterioration.  Critical care was time spent personally by me on the following activities: development of treatment  plan with patient and/or surrogate as well as nursing, discussions with consultants, evaluation of patient's response to treatment, examination of patient, obtaining history from patient or surrogate, ordering and performing treatments and interventions, ordering and review of laboratory studies, ordering and review of radiographic studies, pulse oximetry and re-evaluation of patient's condition.  Eliseo Gum MSN, AGACNP-BC [place] for pager  02/20/2021, 11:18 AM

## 2021-02-20 NOTE — Progress Notes (Signed)
[facility] Progress Note   Subjective:      Objective Vitals:   02/20/21 1000 02/20/21 1005 02/20/21 1015 02/20/21 1030  BP: 118/71  (!) 126/35 (!) 120/47  Pulse: 65  70 75  Resp: _0 Temp:  97.8 F (36.6 C)    TempSrc:  Axillary    SpO2: 100%  100% 100%  Weight:      Height:       Physical Exam General: Older female; NAD Neck: +JVD Heart: Irregular; No murmurs, gallops, or rubs Lungs: Clear anteriorly; diminished laterally Abdomen: Large, soft, non-tender Extremities: No edema UE or LE bilat Dialysis Access: LUA BCF +bruit  OP HD:  [place] [facility]     4h  350/500  87.2kg  2/2 bath  P2  Hep none   - hect 5 ug tiw   - venofer 50 q wk  - no esa     CXR 12/27 - IMPRESSION: Cardiomegaly with mild pulmonary interstitial edema and trace bilateral pleural effusions suggesting CHF.    CXR 12/29 - IMPRESSION: Cardiomegaly, pulmonary vascular congestion and increased hazy opacities since previous study which may represent increasing edema/infiltrate. '    Assessment/Plan: AMS - not sure septic (^temp) vs CVA vs other. Neuro consulting.  Acute resp insufficiency - intubated, on vent. w/ fever/ [place]. Started broad-spec IV abx w/ vanc/ zosyn.  R/o sepsis.  Hypotension - low dose levo gtt Paroxysmal Afib - cardioverted in ED 12/27.  On Eliquis and Metoprolol ESRD - on HD TTS.  Moved to ICU, now on CRRT started today 12/30.  Volume - 2kg up, poss pulm edema on CXR. No vol ^on exam. Pull 50-75 cc/hr net neg is BP's will tolerate.  Anemia of CKD- Hgb 10.1-monitor trends for now. MBD ckd - Ca 8's, phos 5's. Cont vdra here Nutrition - Renal diet with fluid restriction  Kelly Splinter, MD 02/20/2021, 11:21 AM     Additional Objective Labs: Basic Metabolic Panel: CBC: Recent Labs  Lab 02/17/21 1420 02/18/21 0516 02/19/21 0335 02/19/21 1052 02/19/21 1436 02/20/21 1009  WBC 13.7* 11.5* 14.7* 13.0*  --  16.6*  NEUTROABS  --   --  12.0* 11.6*  --   --    HGB 11.9* 10.1* 11.0* 10.9* 11.2* 12.0  HCT 37.2 32.9* 36.3 33.6* 33.0* 36.3  MCV 96.9 100.3* 100.8* 96.6  --  94.8  PLT 300 288 338 271  --  294   Recent Labs  Lab 02/19/21 1440 02/19/21 1947 02/19/21 2300 02/20/21 0336 02/20/21 0801  GLUCAP 204* 163* 160* 198* 146*    Iron Studies: No results for input(s): IRON, TIBC, TRANSFERRIN, FERRITIN in the last 72 hours. Lab Results  Component Value Date   INR 1.1 11/17/2018   Medications:   prismasol BGK 4/2.5 400 mL/hr at 02/20/21 0935    prismasol BGK 4/2.5 200 mL/hr at 02/20/21 0936   sodium chloride Stopped (02/20/21 0952)   sodium chloride     heparin 950 Units/hr (02/20/21 1100)   norepinephrine (LEVOPHED) Adult infusion Stopped (02/20/21 0956)   piperacillin-tazobactam Stopped (02/20/21 0636)   prismasol BGK 4/2.5 1,800 mL/hr at 02/20/21 0935   vancomycin      arformoterol  15 mcg Nebulization BID   atorvastatin  80 mg Per Tube QPM   budesonide (PULMICORT) nebulizer solution  0.5 mg Nebulization BID   chlorhexidine gluconate (MEDLINE KIT)  15 mL Mouth Rinse BID   Chlorhexidine Gluconate Cloth  6 each Topical Q0600   docusate  100 mg Per Tube BID   insulin aspart  0-9 Units Subcutaneous Q4H   latanoprost  1 drop Both Eyes QHS   mouth rinse  15 mL Mouth Rinse 10 times per day   multivitamin  1 tablet Per Tube QHS   pantoprazole (PROTONIX) IV  40 mg Intravenous Q24H   polyethylene glycol  17 g Per Tube Daily   sodium chloride flush  3 mL Intravenous Q12H

## 2021-02-20 NOTE — Progress Notes (Addendum)
Advanced Heart Failure Rounding Note  PCP-Cardiologist: Ezzard Standing, MD   Subjective:    12/29: Stephanie Woodward. Intubated. Started on NE.  MRI brain without acute process. 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  Had fever to 101F. Started on broad spectrum abx and pan cultures sent. Respiratory cultures with GPC and GNR  NE weaned down to 1 this am  CXR 12/29 with pulmonary edema and increasing LLL airspace disease suspicious for PNA. ? Aspiration.  HD catheter placed yesterday for CRRT. No recurrent fever overnight. Hypothermic  WBC 11.5>14.7>13, CBC this am pending  Echo pending.   Sedation off. Responds to painful stimuli.   Objective:   Weight Range: 89.1 kg Body mass index is 34.8 kg/m.   Vital Signs:   Temp:  [94 F (34.4 C)-100.7 F (38.2 C)] 96.4 F (35.8 C) (12/30 0800) Pulse Rate:  [37-78] 49 (12/30 0719) Resp:  [0-22] 12 (12/30 0700) BP: (109-177)/(31-95) 120/41 (12/30 0700) SpO2:  [97 %-100 %] 99 % (12/30 0700) FiO2 (%):  [40 %-100 %] 40 % (12/30 0719) Weight:  [89.1 kg] 89.1 kg (12/30 0500) Last BM Date: 02/17/21  Weight change: Filed Weights   02/18/21 1630 02/19/21 0524 02/20/21 0500  Weight: 88.4 kg 89.6 kg 89.1 kg    Intake/Output:   Intake/Output Summary (Last 24 hours) at 02/20/2021 0832 Last data filed at 02/20/2021 0600 Gross per 24 hour  Intake 1441.84 ml  Output 550 ml  Net 891.84 ml      Physical Exam    General:  Intubated HEENT: normal, R IJ HD catheter, + ETT Neck: supple. JVP 10 cm. Carotids 2+ bilat; no bruits. No lymphadenopathy or thryomegaly appreciated. Cor: PMI nondisplaced. Regular rhythm, bradycardic. No rubs, gallops or murmurs. Lungs: clear Abdomen: soft, nontender, nondistended. No hepatosplenomegaly. No bruits or masses. Good bowel sounds. Extremities: no cyanosis, clubbing, rash, edema Neuro: alert & orientedx3, cranial nerves  grossly intact. moves all 4 extremities w/o difficulty. Affect pleasant     Telemetry   SR 50s  Labs    CBC Recent Labs    02/19/21 0335 02/19/21 1052 02/19/21 1436  WBC 14.7* 13.0*  --   NEUTROABS 12.0* 11.6*  --   HGB 11.0* 10.9* 11.2*  HCT 36.3 33.6* 33.0*  MCV 100.8* 96.6  --   PLT 338 271  --    Basic Metabolic Panel Recent Labs    02/19/21 0335 02/19/21 1052 02/19/21 1436 02/20/21 0433  NA 136 133* 135 133*  K 4.2 4.2 3.9 3.8  CL 97* 96*  --  97*  CO2 27 24  --  23  GLUCOSE 216* 210*  --  189*  BUN 19 26*  --  33*  CREATININE 5.07* 5.78*  --  6.63*  CALCIUM 8.7* 8.2*  --  8.4*  MG  --   --   --  2.1  PHOS 4.2  --   --  5.9*   Liver Function Tests Recent Labs    02/18/21 1555 02/19/21 0335 02/19/21 1052 02/20/21 0433  AST 18  --  88*  --   ALT 18  --  52*  --   ALKPHOS 98  --  116  --   BILITOT 0.5  --  1.1  --   PROT 7.2  --  6.3*  --   ALBUMIN 2.9*   < > 2.5* 2.4*   < > = values in this interval not displayed.  No results for input(s): LIPASE, AMYLASE in the last 72 hours. Cardiac Enzymes No results for input(s): CKTOTAL, CKMB, CKMBINDEX, TROPONINI in the last 72 hours.  BNP: BNP (last 3 results) Recent Labs    06/08/20 1011  BNP 402.0*    ProBNP (last 3 results) No results for input(s): PROBNP in the last 8760 hours.   D-Dimer No results for input(s): DDIMER in the last 72 hours. Hemoglobin A1C Recent Labs    02/18/21 0516  HGBA1C 9.1*   Fasting Lipid Panel Recent Labs    02/20/21 0433  TRIG 107   Thyroid Function Tests Recent Labs    02/18/21 1555  TSH 1.546    Other results:   Imaging    CT ANGIO HEAD NECK W WO CM  Result Date: 02/19/2021 CLINICAL DATA:  Neuro deficit, acute, stroke suspected EXAM: CT HEAD WITHOUT CONTRAST CT ANGIOGRAPHY OF THE HEAD AND NECK TECHNIQUE: Contiguous axial images were obtained from the base of the skull through the vertex without intravenous contrast. Multidetector CT  imaging of the head and neck was performed using the standard protocol during bolus administration of intravenous contrast. Multiplanar CT image reconstructions and MIPs were obtained to evaluate the vascular anatomy. Carotid stenosis measurements (when applicable) are obtained utilizing NASCET criteria, using the distal internal carotid diameter as the denominator. CONTRAST:  62m OMNIPAQUE IOHEXOL 350 MG/ML SOLN COMPARISON:  None. FINDINGS: CT HEAD Brain: No evidence of acute large vascular territory infarction, hemorrhage, hydrocephalus, extra-axial collection or mass lesion/mass effect. Moderate patchy white matter hypodensities, nonspecific but compatible with chronic microvascular ischemic disease. Vascular: Detailed below. Skull: No acute fracture. Sinuses/Orbits: Clear sinuses.  Unremarkable orbits. CTA NECK Aortic arch: Great vessel origins are patent without significant stenosis. Right carotid system: No evidence of significant (greater than 50%) stenosis. Mild atherosclerosis at the carotid bifurcation. Left carotid system: No evidence of significant (greater than 50%) stenosis. Mild atherosclerosis at the carotid bifurcation. Vertebral arteries:Mildly right dominant. Multifocal mild stenosis of the left vertebral artery. No evidence of greater than 50% stenosis. z Skeleton: Multilevel degenerative change in the cervical spine Other neck: Visualized lung apices are clear. Approximately 8 mm right thyroid nodule, which does not require further imaging follow-up per current guidelines (ref: J Am Coll Radiol. 2015 Feb;12(2): 143-50). CTA HEAD Anterior circulation: Calcific atherosclerosis of bilateral intracranial ICAs with resulting severe left and moderate right stenosis. 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 Otherwise, MCAs and ACAs are patent. Posterior circulation: Small/non dominant left  vertebral artery. Bilateral intradural vertebral arteries, basilar artery, and posterior cerebral arteries are patent. The PCAs are small and poorly visualized distally, but patent proximally without proximal high-grade stenosis. Venous sinuses: No evidence of dural venous sinus thrombosis. Anatomic variants: Detailed above. IMPRESSION: CT head: 1. No evidence of acute intracranial abnormality. 2. Moderate chronic microvascular ischemic disease. CTA: 1. No large vessel occlusion. 2. 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 Preliminary findings discussed with Dr. CTheda Sersvia telephone at 9:43 a.m. Electronically Signed   By: FMargaretha SheffieldM.D.   On: 02/19/2021 10:00   CT HEAD WO CONTRAST (5MM)  Result Date: 02/19/2021 CLINICAL DATA:  Neuro deficit, acute, stroke suspected EXAM: CT HEAD WITHOUT CONTRAST CT ANGIOGRAPHY OF THE HEAD AND NECK TECHNIQUE: Contiguous axial images were obtained from the base of the skull through the vertex without intravenous contrast. Multidetector CT imaging of the head and neck was  performed using the standard protocol during bolus administration of intravenous contrast. Multiplanar CT image reconstructions and MIPs were obtained to evaluate the vascular anatomy. Carotid stenosis measurements (when applicable) are obtained utilizing NASCET criteria, using the distal internal carotid diameter as the denominator. CONTRAST:  16m OMNIPAQUE IOHEXOL 350 MG/ML SOLN COMPARISON:  None. FINDINGS: CT HEAD Brain: No evidence of acute large vascular territory infarction, hemorrhage, hydrocephalus, extra-axial collection or mass lesion/mass effect. Moderate patchy white matter hypodensities, nonspecific but compatible with chronic microvascular ischemic disease. Vascular: Detailed below. Skull: No acute fracture. Sinuses/Orbits: Clear sinuses.  Unremarkable orbits. CTA NECK Aortic arch: Great vessel origins are patent without significant stenosis. Right carotid system: No  evidence of significant (greater than 50%) stenosis. Mild atherosclerosis at the carotid bifurcation. Left carotid system: No evidence of significant (greater than 50%) stenosis. Mild atherosclerosis at the carotid bifurcation. Vertebral arteries:Mildly right dominant. Multifocal mild stenosis of the left vertebral artery. No evidence of greater than 50% stenosis. z Skeleton: Multilevel degenerative change in the cervical spine Other neck: Visualized lung apices are clear. Approximately 8 mm right thyroid nodule, which does not require further imaging follow-up per current guidelines (ref: J Am Coll Radiol. 2015 Feb;12(2): 143-50). CTA HEAD Anterior circulation: Calcific atherosclerosis of bilateral intracranial ICAs with resulting severe left and moderate right stenosis. 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 Otherwise, MCAs and ACAs are patent. Posterior circulation: Small/non dominant left vertebral artery. Bilateral intradural vertebral arteries, basilar artery, and posterior cerebral arteries are patent. The PCAs are small and poorly visualized distally, but patent proximally without proximal high-grade stenosis. Venous sinuses: No evidence of dural venous sinus thrombosis. Anatomic variants: Detailed above. IMPRESSION: CT head: 1. No evidence of acute intracranial abnormality. 2. Moderate chronic microvascular ischemic disease. CTA: 1. No large vessel occlusion. 2. 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 Preliminary findings discussed with Dr. CTheda Sersvia telephone at 9:43 a.m. Electronically Signed   By: FMargaretha SheffieldM.D.   On: 02/19/2021 10:00   MR BRAIN WO CONTRAST  Result Date: 02/19/2021 CLINICAL DATA:  Encephalopathy EXAM: MRI HEAD WITHOUT CONTRAST TECHNIQUE: Multiplanar, multiecho pulse sequences of the brain and surrounding structures were obtained without  intravenous contrast. COMPARISON:  None. FINDINGS: Brain: No acute infarct, mass effect or extra-axial collection. No acute or chronic hemorrhage. Hyperintense T2-weighted signal is moderately widespread throughout the white matter. Generalized volume loss without a clear lobar predilection. The midline structures are normal. Vascular: Major flow voids are preserved. Skull and upper cervical spine: Normal calvarium and skull base. Visualized upper cervical spine and soft tissues are normal. Sinuses/Orbits:No paranasal sinus fluid levels or advanced mucosal thickening. No mastoid or middle ear effusion. Normal orbits. The patient is intubated and there is fluid filling the nasopharynx. IMPRESSION: 1. No acute intracranial abnormality. 2. Generalized volume loss and findings of chronic small vessel ischemia. 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 Electronically Signed   By: KUlyses JarredM.D.   On: 02/19/2021 22:22   DG CHEST PORT 1 VIEW  Result Date: 02/19/2021 CLINICAL DATA:  Central line placement. EXAM: PORTABLE CHEST 1 VIEW COMPARISON:  Radiographs 02/19/2021 and 02/17/2021.  CT 11/26/2017. FINDINGS: 1705 hours. The endotracheal tube and enteric tube appear unchanged, although the tip of the latter is not visualized. There is new right IJ central venous catheter projecting to the level of the superior cavoatrial junction. The heart remains enlarged. There is persistent vascular congestion with  probable mild pulmonary edema. There is increased left lower lobe airspace disease and a probable small left pleural effusion. No evidence of pneumothorax. Degenerative changes are present in the spine and shoulders. Telemetry leads overlie the chest. IMPRESSION: 1. New right IJ central venous catheter projects to the level of the superior cavoatrial junction. No evidence of pneumothorax. 2. Persistent pulmonary edema with increasing left lower lobe airspace disease suspicious for pneumonia, possibly on the basis of  aspiration. Electronically Signed   By: Richardean Sale M.D.   On: 02/19/2021 17:22   DG CHEST PORT 1 VIEW  Result Date: 02/19/2021 CLINICAL DATA:  Hypoxia EXAM: PORTABLE CHEST 1 VIEW COMPARISON:  Chest x-ray 02/17/2021 FINDINGS: Endotracheal tube tip is 4 cm above the carina. Enteric tube tip is below the diaphragm. Heart is enlarged. Mediastinum appears grossly stable. Pulmonary vascular congestion with hazy perihilar and lower lung zone opacities, increased since previous study. No large pleural effusion identified. Trace left effusion not excluded. No pneumothorax. IMPRESSION: 1. Medical devices as described. 2. Cardiomegaly, pulmonary vascular congestion and increased hazy opacities since previous study which may represent increasing edema/infiltrate. Electronically Signed   By: Ofilia Neas M.D.   On: 02/19/2021 09:07   EEG adult  Result Date: 02/19/2021 Lora Havens, MD     02/19/2021 12:11 PM Patient Name: Stephanie Woodward MRN: [id number] Epilepsy Attending: Lora Havens Referring Physician/Provider: Jennelle Human, NP Date: 02/19/2021 Duration: 23.14 mins Patient history: 80 year old female with altered mental status.  EEG to evaluate for seizure. Level of alertness: lethargic AEDs during EEG study: Propofol Technical aspects: This EEG study was done with scalp electrodes positioned according to the 10-20 International system of electrode placement. Electrical activity was acquired at a sampling rate of '500Hz'  and reviewed with a high frequency filter of '70Hz'  and a low frequency filter of '1Hz' . EEG data were recorded continuously and digitally stored. Description: EEG showed continuous generalized 3 to 6 Hz theta-delta slowing.  There is also an excessive amount of 15 to 18 Hz sharply contoured beta activity with irregular morphology distributed symmetrically and diffusely. Hyperventilation and photic stimulation were not performed.   ABNORMALITY - Continuous slow, generalized - Excessive  beta, generalized IMPRESSION: This study is suggestive of moderate to severe diffuse encephalopathy, nonspecific etiology but could be secondary to sedation. No seizures or definite epileptiform discharges were seen throughout the recording. Priyanka Barbra Sarks     Medications:     Scheduled Medications:  arformoterol  15 mcg Nebulization BID   atorvastatin  80 mg Per Tube QPM   budesonide (PULMICORT) nebulizer solution  0.5 mg Nebulization BID   chlorhexidine gluconate (MEDLINE KIT)  15 mL Mouth Rinse BID   Chlorhexidine Gluconate Cloth  6 each Topical Q0600   docusate  100 mg Per Tube BID   insulin aspart  0-9 Units Subcutaneous Q4H   latanoprost  1 drop Both Eyes QHS   mouth rinse  15 mL Mouth Rinse 10 times per day   multivitamin  1 tablet Per Tube QHS   pantoprazole (PROTONIX) IV  40 mg Intravenous Q24H   polyethylene glycol  17 g Per Tube Daily   sodium chloride flush  3 mL Intravenous Q12H    Infusions:   prismasol BGK 4/2.5      prismasol BGK 4/2.5     sodium chloride 10 mL/hr at 02/20/21 0600   sodium chloride     heparin 800 Units/hr (02/20/21 0600)   norepinephrine (LEVOPHED) Adult infusion 2 mcg/min (02/20/21 0600)  piperacillin-tazobactam 3.375 g (02/20/21 0606)   prismasol BGK 4/2.5     propofol (DIPRIVAN) infusion 5 mcg/kg/min (02/20/21 0600)   vancomycin      PRN Medications: sodium chloride, acetaminophen **OR** acetaminophen, alteplase, fentaNYL (SUBLIMAZE) injection, fentaNYL (SUBLIMAZE) injection, heparin, hydrALAZINE, ipratropium-albuterol, ondansetron **OR** ondansetron (ZOFRAN) IV, senna-docusate, sodium chloride   Assessment/Plan   Atrial fibrillation:  -Known hx paroxysmal AF -Seen in ED at [facility] last week for AF and spontaneously converted to SR -Presented 02/17/21 with AF with RVR. Cardioverted in ER.  -SR with short runs of AF -Hold amio and beta blocker with bradycardia -heparin gtt. No evidence of CVA on MRI brain 2. 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  [facility] 11/23/17 showed significantly elevated right and left heart filling pressures and severe primarily pulmonary venous hypertension. Cardiac output preserved. Echo (10/19) with EF 45-50%, septal-lateral dyssynchrony, RV looked ok.  PYP scan was not suggestive of transthyretin amyloidosis.  Echo in 1/21 with EF 55-60%, normal RV.  RHC/LHC in 4/21 with no significant CAD, normal filling pressures and cardiac output.   -NYHA Class III, chronic, and confounded by COPD and obesity.  -Volume up on admit with evidence of CHF on chest x-ray. Likely d/t AF with RVR. -HD cath placed to initiate CRRT for volume management -Repeat echo pending d/t episode of unresponsiveness/acute respiratory failure -Will check ECG and troponin. Co-ox now that she has central access 3. Acute respiratory failure: -Minimally responsive 12/29 and intubated for airway protection -vent management per PCCM -Now on broad specturm abx. Cultures pending. GPCs and GNR in sputum, reincubated -? Aspiration event. Chest x-ray with increasing LLL airspace disease -Echo pending. ECG and troponins as above. Co-ox. 4. Acute encephalopathy -Last seen at baseline around 3 am, found minimally responsive around 7:30 am -Head CT and MRI brain without acute process. Bilateral intracranial ICA stenosis. EEG with no seizure activity 5. ESRD: CRRT per renal. Followed by Dr. Hollie Salk as outpatient 6. Pulmonary HTN: Mild PH on 4/21 RHC.  7. Hyperlipidemia: on atorvastatin 80.  8. DM II: A1c 9.1 -Per primary team 9. Leukocytosis -WBC trending up, 13.7>11.5>14.7.13.0, CBC this am pending  -Developed fever with Tmax 100.7 -Pan cultures, started on empiric abx with vanc/zosyn  Length of Stay: 1  FINCH, LINDSAY N, PA-C  02/20/2021, 8:32  AM  Advanced Heart Failure Team Pager [phone] (M-F; 7a - 5p)  Please contact [facility] for night-coverage after hours (5p -7a ) and weekends on [email or web address]  Patient seen with PA, agree with the above note.   She is awake on vent this morning.  On NE 1 with stable MAP, have not started CVVH yet.    MRI head with no CVA.   CXR with pulmonary edema, LLL airspace disease.   ECG today with junctional bradycardia at 48, poor RWP.    Echo reviewed: EF 30-35% range, worse function in septum; mild RV dysfunction, IVC dilated.   General: On vent Neck: JVP 12+ cm, no thyromegaly or thyroid nodule.  Lungs: Clear to auscultation bilaterally with normal respiratory effort. CV: Nondisplaced PMI.  Heart brady, regular S1/S2, no S3/S4, no murmur.  No peripheral edema.  N Abdomen: Soft, nontender, no hepatosplenomegaly, no distention.  Skin: Intact without lesions or rashes.  Neurologic: Awake on vent, will follow commands.  Extremities: No clubbing or cyanosis.  HEENT: Normal.  Cause of unresponsive event uncertain but possibly aspiration event based on CXR with LLL infiltrate and pulmonary edema.  MRI head did not show CVA. She is awake on the vent.  PCT 0.35.  - Treat with abx for aspiration PNA, vancomycin/Zosyn.  - Vent per CCM.   Noted to have severe intracranial (but not extracranial) bilateral carotid stenosis but no evidence for CVA. She is on atorvastatin.   Rhythm today is junctional brady in the upper 40s-50s.  She is off amiodarone and Toprol XL.  AF earlier during admission.  - On heparin gtt.   Echo reviewed as above with EF lower at 30-35% with worse function in septum.  She is on NE 1 with stable MAP.  Volume overloaded by dilated IVC on echo and exam.  It is possible that fall in EF is a stress cardiomyopathy from aspiration event with hypoxemia.  ECG without evidence for acute MI.  - Check troponin.  - I think she will need eventual cath to assess coronaries.  - CVVH to  start, pull net negative UF 75-100 cc/hr.  - Check co-ox and follow CVP.   CRITICAL CARE Performed by: Loralie Champagne  Total critical care time: 40 minutes  Critical care time was exclusive of separately billable procedures and treating other patients.  Critical care was necessary to treat or prevent imminent or life-threatening deterioration.  Critical care was time spent personally by me on the following activities: development of treatment plan with patient and/or surrogate as well as nursing, discussions with consultants, evaluation of patient's response to treatment, examination of patient, obtaining history from patient or surrogate, ordering and performing treatments and interventions, ordering and review of laboratory studies, ordering and review of radiographic studies, pulse oximetry and re-evaluation of patient's condition.   Loralie Champagne 02/20/2021 9:32 AM

## 2021-02-20 NOTE — Progress Notes (Signed)
[place] for apixaban>heparin infusion Indication: atrial fibrillation  Allergies  Allergen Reactions   Eggs Or Egg-Derived Products Nausea And Vomiting   Lisinopril Nausea And Vomiting   Penicillins Nausea And Vomiting    Has patient had a PCN reaction causing immediate rash, facial/tongue/throat swelling, SOB or lightheadedness with hypotension: No Has patient had a PCN reaction causing severe rash involving mucus membranes or skin necrosis: No Has patient had a PCN reaction that required hospitalization: No Has patient had a PCN reaction occurring within the last 10 years: No If all of the above answers are "NO", then may proceed with Cephalosporin use.    Other Other (See Comments)    Patient Measurements: Height: _0  (160 cm) Weight: 89.1 kg (196 lb 6.9 oz) IBW/kg (Calculated) : 52.4 Heparin Dosing Weight: 72.7 kg   Vital Signs: Temp: 96.4 F (35.8 C) (12/30 0800) Temp Source: Axillary (12/30 0800) BP: 120/41 (12/30 0700) Pulse Rate: 49 (12/30 0719)  Labs: Recent Labs    02/17/21 1420 02/17/21 1640 02/18/21 0516 02/19/21 0335 02/19/21 1052 02/19/21 1436 02/19/21 1841 02/20/21 0433  HGB 11.9*  --  10.1* 11.0* 10.9* 11.2*  --   --   HCT 37.2  --  32.9* 36.3 33.6* 33.0*  --   --   PLT 300  --  288 338 271  --   --   --   APTT  --   --   --   --   --   --  119* 49*  HEPARINUNFRC  --   --   --   --   --   --  >1.10* >1.10*  CREATININE 4.42*  --  5.77* 5.07* 5.78*  --   --  6.63*  TROPONINIHS 64* 67*  --   --   --   --   --   --      Estimated Creatinine Clearance: 7.4 mL/min (A) (by C-G formula based on SCr of 6.63 mg/dL (H)).   Medical History: Past Medical History:  Diagnosis Date   Arthritis    Asthma    Atrial fibrillation (HCC)    CHF (congestive heart failure) (HCC)    CKD (chronic kidney disease), stage III (HCC)    COPD (chronic obstructive pulmonary disease) (HCC)    Diabetes mellitus    INSULIN  DEPENDENT   Gout    Heart murmur    no issues per pt   History of kidney stones    Hyperlipemia    Hypertension    Psoriasis    Renal disorder    congenital   Single kidney    Sleep apnea    doesn't use the Cpap    Medications:  Scheduled:   arformoterol  15 mcg Nebulization BID   atorvastatin  80 mg Per Tube QPM   budesonide (PULMICORT) nebulizer solution  0.5 mg Nebulization BID   chlorhexidine gluconate (MEDLINE KIT)  15 mL Mouth Rinse BID   Chlorhexidine Gluconate Cloth  6 each Topical Q0600   docusate  100 mg Per Tube BID   insulin aspart  0-9 Units Subcutaneous Q4H   latanoprost  1 drop Both Eyes QHS   mouth rinse  15 mL Mouth Rinse 10 times per day   multivitamin  1 tablet Per Tube QHS   pantoprazole (PROTONIX) IV  40 mg Intravenous Q24H   polyethylene glycol  17 g Per Tube Daily   sodium chloride flush  3 mL Intravenous Q12H  Assessment: 27 yof presenting with Afib - on apixaban PTA for hx Afib. Last dose on 12/28_0 .   Transferred from ICU due to unresponsiveness - now intubated. CT head showing no bleed but moderate chronic microvascular ischemia disease and severe L/moderate R intracranial ICA stenosis. Given recent DOAC, will need to monitor both aPTT and heparin level until correlate.   Heparin level is elevated as expected by DOAC, aPTT is subtherapeutic at 49, on 800 units/hr. Hgb 11.2, plt 271. No s/sx of bleeding or infusion issues.   Goal of Therapy:  Heparin level 0.3-0.7 units/ml aPTT 66-102 seconds Monitor platelets by anticoagulation protocol: Yes   Plan:  Increase heparin to 950 units/hr Recheck heparin level and aPTT in 8h Monitor daily HL, CBC, and for s/sx of bleeding  Antonietta Jewel, PharmD, [place] [facility]  Phone: [phone] 02/20/2021 8:45 AM  Please check AMION for all [place] phone numbers After 10:00 PM, call [place] [phone]

## 2021-02-20 NOTE — Progress Notes (Signed)
Nutrition Follow-up  DOCUMENTATION CODES:   Non-severe (moderate) malnutrition in context of chronic illness  INTERVENTION:    Monitor for diet advancement and order appropriate oral nutrition supplement   NUTRITION DIAGNOSIS:   Moderate Malnutrition related to chronic illness (ESRD) as evidenced by mild fat depletion, mild muscle depletion.  updated  GOAL:   Patient will meet greater than or equal to 90% of their needs  Not met  MONITOR:   TF tolerance, Labs, Weight trends, Vent status  REASON FOR ASSESSMENT:   Ventilator    ASSESSMENT:   80 yo female admitted on 12/27 with afib with RVR, acute on chronic CHF/pulmonary edema. Pt found unresponsive on 12/29 requiring intubation. PMH includes ESRD on HD, CHF, DM, COPD, HLD  12/27 Admitted 12/28 iHD 12/29 Intubated; OGT placed (gastric tip confirmed via xray) 12/30 - extubated; OGT removed   Discussed pt with RN.   Pt extubated this morning. Pt remains confused and unable to provide meaningful history at this time. Pt's daughter at bedside assisting with history. Per pt's daughter, pt eats 2 meals and some snacks on HD days and likely eats 3 meals on non-HD days. Daughter unable to provide many details regarding diet, but states that she feels meals are likely somewhat balanced. Daughter does not think pt has tried ONS previously but feels she will do well with them. Reviewed dietary preferences and will order appropriate ONS pending diet advancement. SLP to evaluation pt post extubation.   Pt remains on CRRT, 45m/hr output  Medications: colace, SSI Q4H, rena-vit, protonix, miralax, IV abx Drips: levophed Labs: Na 133 (L), PO4 5.9 (H) CBGs: 146-204 x 24 hours  Outpt EDW 87.2 kg Current wt 89.1 kg + generalized mild pitting edema per RN edema assessment   No UOP documented; pt reports urinating at least once/day OGT output: [id number] hours I/O: +[id number] admit  Admit weight 89.8 kg Current weight 89.1 kg    NUTRITION - FOCUSED PHYSICAL EXAM: Note NFPE limited d/t pt complaining pain in extremities  Flowsheet Row Most Recent Value  Orbital Region Mild depletion  Upper Arm Region Mild depletion  Thoracic and Lumbar Region No depletion  Buccal Region Mild depletion  Temple Region Mild depletion  Clavicle Bone Region No depletion  Clavicle and Acromion Bone Region No depletion  Scapular Bone Region No depletion  Dorsal Hand Mild depletion  Patellar Region No depletion  Anterior Thigh Region Mild depletion  Posterior Calf Region Mild depletion  Edema (RD Assessment) Mild  Hair Unable to assess  Eyes Reviewed  Mouth Other (Comment)  [edentulous]  Skin Other (Comment)  [dry]  Nails Reviewed       Diet Order:   Diet Order             Diet NPO time specified  Diet effective now                   EDUCATION NEEDS:   Not appropriate for education at this time  Skin:  Skin Assessment: Reviewed RN Assessment  Last BM:  12/27  Height:   Ht Readings from Last 1 Encounters:  02/19/21 '5\' 3"'  (1.6 m)    Weight:   Wt Readings from Last 1 Encounters:  02/20/21 89.1 kg    BMI:  Body mass index is 34.8 kg/m.  Estimated Nutritional Needs:   Kcal:  1600-1800 kcals  Protein:  105-130 g  Fluid:  1000 mL plus UOP     Rayah Fines A., MS, RD, LDN (she/her/hers)  RD pager number and weekend/on-call pager number located in [place].

## 2021-02-20 NOTE — Discharge Instructions (Signed)
We discussed pill tops with timers such as those found here: FastfoodLife.com.cy  [place] [facility] Stay Proper nutrition can help your body recover from illness and injury.   Foods and beverages high in protein, vitamins, and minerals help rebuild muscle loss, promote healing, & reduce fall risk.   In addition to eating healthy foods, a nutrition shake is an easy, delicious way to get the nutrition you need during and after your hospital stay  It is recommended that you continue to drink 2 bottles per day of:       Nepro Carb Steady for at least 1 month (30 days) after your hospital stay   Tips for adding a nutrition shake into your routine: As allowed, drink one with vitamins or medications instead of water or juice Enjoy one as a tasty mid-morning or afternoon snack Drink cold or make a milkshake out of it Drink one instead of milk with cereal or snacks Use as a coffee creamer   Available at the following grocery stores and pharmacies:           * [place] [facility] [place] [phone]            For COUPONS visit: www.ensure.com/join or [email or web address]-may.[email or web address]/   Suggested Substitutions Ensure Plus = Boost Plus = Carnation Breakfast Essentials = Boost Compact Ensure Active Clear = Boost Breeze Glucerna Shake = Boost Glucose Control = Carnation Breakfast Essentials SUGAR FREE

## 2021-02-21 ENCOUNTER — Inpatient Hospital Stay (HOSPITAL_COMMUNITY): Payer: HMO

## 2021-02-21 DIAGNOSIS — I5043 Acute on chronic combined systolic (congestive) and diastolic (congestive) heart failure: Secondary | ICD-10-CM | POA: Diagnosis not present

## 2021-02-21 DIAGNOSIS — I480 Paroxysmal atrial fibrillation: Secondary | ICD-10-CM | POA: Diagnosis not present

## 2021-02-21 DIAGNOSIS — E440 Moderate protein-calorie malnutrition: Secondary | ICD-10-CM | POA: Insufficient documentation

## 2021-02-21 LAB — MAGNESIUM: Magnesium: 2.3 mg/dL (ref 1.7–2.4)

## 2021-02-21 LAB — APTT: aPTT: 105 seconds — ABNORMAL HIGH (ref 24–36)

## 2021-02-21 LAB — CULTURE, RESPIRATORY W GRAM STAIN: Culture: NORMAL

## 2021-02-21 LAB — RENAL FUNCTION PANEL
Albumin: 2.3 g/dL — ABNORMAL LOW (ref 3.5–5.0)
Albumin: 2.4 g/dL — ABNORMAL LOW (ref 3.5–5.0)
Anion gap: 12 (ref 5–15)
Anion gap: 15 (ref 5–15)
BUN: 13 mg/dL (ref 8–23)
BUN: 7 mg/dL — ABNORMAL LOW (ref 8–23)
CO2: 23 mmol/L (ref 22–32)
CO2: 24 mmol/L (ref 22–32)
Calcium: 8.2 mg/dL — ABNORMAL LOW (ref 8.9–10.3)
Calcium: 8.9 mg/dL (ref 8.9–10.3)
Chloride: 101 mmol/L (ref 98–111)
Chloride: 99 mmol/L (ref 98–111)
Creatinine, Ser: 2.94 mg/dL — ABNORMAL HIGH (ref 0.44–1.00)
Creatinine, Ser: 2.08 mg/dL — ABNORMAL HIGH (ref 0.44–1.00)
GFR, Estimated: 16 mL/min — ABNORMAL LOW (ref >60)
GFR, Estimated: 24 mL/min — ABNORMAL LOW (ref >60)
Glucose, Bld: 131 mg/dL — ABNORMAL HIGH (ref 70–99)
Glucose, Bld: 124 mg/dL — ABNORMAL HIGH (ref 70–99)
Phosphorus: 2.8 mg/dL (ref 2.5–4.6)
Phosphorus: 2.5 mg/dL (ref 2.5–4.6)
Potassium: 4.2 mmol/L (ref 3.5–5.1)
Potassium: 4.3 mmol/L (ref 3.5–5.1)
Sodium: 136 mmol/L (ref 135–145)
Sodium: 138 mmol/L (ref 135–145)

## 2021-02-21 LAB — CBC
HCT: 32.7 % — ABNORMAL LOW (ref 36.0–46.0)
Hemoglobin: 10.5 g/dL — ABNORMAL LOW (ref 12.0–15.0)
MCH: 30.6 pg (ref 26.0–34.0)
MCHC: 32.1 g/dL (ref 30.0–36.0)
MCV: 95.3 fL (ref 80.0–100.0)
Platelets: 288 10*3/uL (ref 150–400)
RBC: 3.43 MIL/uL — ABNORMAL LOW (ref 3.87–5.11)
RDW: 13.6 % (ref 11.5–15.5)
WBC: 16.4 10*3/uL — ABNORMAL HIGH (ref 4.0–10.5)
nRBC: 0.1 % (ref 0.0–0.2)

## 2021-02-21 LAB — GLUCOSE, CAPILLARY
Glucose-Capillary: 119 mg/dL — ABNORMAL HIGH (ref 70–99)
Glucose-Capillary: 128 mg/dL — ABNORMAL HIGH (ref 70–99)
Glucose-Capillary: 128 mg/dL — ABNORMAL HIGH (ref 70–99)
Glucose-Capillary: 133 mg/dL — ABNORMAL HIGH (ref 70–99)
Glucose-Capillary: 138 mg/dL — ABNORMAL HIGH (ref 70–99)
Glucose-Capillary: 139 mg/dL — ABNORMAL HIGH (ref 70–99)

## 2021-02-21 LAB — HEPARIN LEVEL (UNFRACTIONATED): Heparin Unfractionated: >1.1 IU/mL — ABNORMAL HIGH (ref 0.30–0.70)

## 2021-02-21 MED ORDER — AMIODARONE HCL 200 MG PO TABS
200.0000 mg | ORAL_TABLET | Freq: Two times a day (BID) | ORAL | Status: DC
  Administered 2021-02-21 – 2021-02-22 (×4): 200 mg via ORAL
  Filled 2021-02-21 (×4): qty 1

## 2021-02-21 MED ORDER — QUETIAPINE FUMARATE 25 MG PO TABS
25.0000 mg | ORAL_TABLET | Freq: Every day | ORAL | Status: DC
  Administered 2021-02-21 – 2021-03-04 (×11): 25 mg via ORAL
  Filled 2021-02-21 (×11): qty 1

## 2021-02-21 NOTE — Progress Notes (Signed)
NAME:  Kambrea Carrasco, MRN:  [id number], DOB:  26-Jan-1941, LOS: 2 ADMISSION DATE:  02/17/2021, CONSULTATION DATE:  02/19/2021 REFERRING MD:  Dr. Olevia Bowens, CHIEF COMPLAINT:  AMS   History of Present Illness:   80 year old female with prior history of COPD, mild pulmonary hypertension, ESRD TTS, PAF on Eliquis, HFpEF, IDDM DMT2, and obesity who presented on 1227 from dialysis with shortness of breath and palpitations.  She did not receive full dialysis treatment, last full treatment on 12/24 at her EDW.  Prior hospitalization 1 week ago at [facility] found to be in A. fib with RVR and placed on Eliquis at that time.  Plans for DCCV however she self converted.  No reports of prior fever, cough, nausea, or vomiting.    Found in ER afebrile, normotensive, and normal oxygenation but in A. fib with RVR in which cardiology was consulted and she was cardioverted back to normal sinus.  She was admitted to [facility], nephrology consulted and underwent iHD on 12/28.  She did have some brief hypotension at the end of iHD but recovered.  Last seen at baseline around 0300 on 12/29.  Around 0730, she was found to be minimally unresponsive with concern for airway protection.  Rapid response at bedside, glucose 206, and no new or sedating meds other than her home remeron last night.  She remains in NSR and normotensive with good O2 saturations.  She was placed on BIPAP, ABG showing 7.283/ 60/ 92/ 27.5.  PCCM consulted and patient transferred to ICU.    Of note, speaking with patient's two daughters, they report patient has been having episodes with difficulty swallowing/ choking episodes at least for two weeks and patient complained of hand tremors 12/28.   Pertinent  Medical History  COPD, former smoker (quit 15 yrs ago) mild pulmonary hypertension, ESRD, PAF on Eliquis, HFpEF, IDDM DMT2, obesity  Significant Hospital Events: Including procedures, antibiotic start and stop dates in addition to other pertinent  events   12/27 Admitted [place], cards/ nephrology consulted, cardioverted,  12/28 iHD 12/29 unresponsive, LNW 0300, intubated for airway protection, CT, MRI., EEG unremarkable  12/30 extubate  Interim History / Subjective:  Delirious but pleasant. Daughter at bedside. On CRRT, off pressors.  Objective   Blood pressure (!) 145/59, pulse 89, temperature (!) 97.5 F (36.4 C), temperature source Oral, resp. rate 14, height 5\' 3"  (1.6 m), weight 88.6 kg, SpO2 100 %. CVP:  [2 mmHg-15 mmHg] 7 mmHg  Vent Mode: Stand-by FiO2 (%):  [40 %] 40 % PEEP:  [5 cmH20] 5 cmH20 Pressure Support:  [12 cmH20] 12 cmH20   Intake/Output Summary (Last 24 hours) at 02/21/2021 0835 Last data filed at 02/21/2021 0800 Gross per 24 hour  Intake 763.51 ml  Output 2278 ml  Net -1514.49 ml    Filed Weights   02/19/21 0524 02/20/21 0500 02/21/21 0500  Weight: 89.6 kg 89.1 kg 88.6 kg   Examination: No distress [place] 4, MMM Moves all 4 ext to command AO to person, place, not situation Lungs with crackles at bases RIJ HD catheter CDI  BMP/CBC reviewed, notable for persistent leukocytosis  Resolved Hospital Problem list    Assessment & Plan:   Coma of unclear etiology question exaggerated CO2 narcosis- improved.  MRI/CT/EEG benign. Respiratory failure due to inability to protect airway improved ESRD on HD with marginal pressures now on CRRT Cardiomyopathy, new ?stress, CHF team following Afib s/p DCCV, in sinus ICU delirium- expected given events above LLL Pna- question aspiration during unresponsive event  above  - Amio/heparin/CRRT pull per CHF team and nephrology discussion - Encourage day/night cycles, trial of seroquel qHS - Broad spectrum abx, f/u culture data - f/u SLP recs RE: FEES vs. Advancing diet - PT/OT, hopefully can go to iHD soon to promote mobility - Can go to progressive once off CRRT  Best Practice (right click and "Reselect all SmartList Selections" daily)   Diet/type:  NPO DVT prophylaxis: hep gtt  GI prophylaxis: PPI Lines: Dialysis Catheter and Arterial Line Foley:  N/A Code Status:  full code Last date of multidisciplinary goals of care discussion: 12/31 daughters at bedside   Erskine Emery MD PCCM

## 2021-02-21 NOTE — Procedures (Signed)
Objective Swallowing Evaluation: Type of Study: FEES-Fiberoptic Endoscopic Evaluation of Swallow   Patient Details  Name: Stephanie Woodward MRN: [id number] Date of Birth: 07-22-1940  Today's Date: 02/21/2021 Time: SLP Start Time (ACUTE ONLY): 1440 -SLP Stop Time (ACUTE ONLY): 1510  SLP Time Calculation (min) (ACUTE ONLY): 30 min   Past Medical History:  Past Medical History:  Diagnosis Date   Arthritis    Asthma    Atrial fibrillation (HCC)    CHF (congestive heart failure) (HCC)    CKD (chronic kidney disease), stage III (HCC)    COPD (chronic obstructive pulmonary disease) (HCC)    Diabetes mellitus    INSULIN DEPENDENT   Gout    Heart murmur    no issues per pt   History of kidney stones    Hyperlipemia    Hypertension    Psoriasis    Renal disorder    congenital   Single kidney    Sleep apnea    doesn't use the Cpap   Past Surgical History:  Past Surgical History:  Procedure Laterality Date   A/V FISTULAGRAM Left 10/31/2020   Procedure: A/V FISTULAGRAM;  Surgeon: Angelia Mould, MD;  Location: [place] CV [facility];  Service: Cardiovascular;  Laterality: Left;   ABDOMINAL HYSTERECTOMY     AV FISTULA PLACEMENT Left 11/17/2018   Procedure: BRACHIO-CEPHALIC ARTERIOVENOUS (AV) FISTULA CREATION IN LEFT ARM;  Surgeon: Marty Heck, MD;  Location: [place];  Service: Vascular;  Laterality: Left;   CHOLECYSTECTOMY     INSERTION OF DIALYSIS CATHETER Right 01/26/2019   Procedure: INSERTION OF DIALYSIS CATHETER, right internal jugular;  Surgeon: Angelia Mould, MD;  Location: [place];  Service: Vascular;  Laterality: Right;   LEFT AND RIGHT HEART CATHETERIZATION WITH CORONARY ANGIOGRAM N/A 11/29/2013   Procedure: LEFT AND RIGHT HEART CATHETERIZATION WITH CORONARY ANGIOGRAM;  Surgeon: Jacolyn Reedy, MD;  Location: [facility] C[facility] LAB;  Service: Cardiovascular;  Laterality: N/A;   PERIPHERAL VASCULAR INTERVENTION Left 10/31/2020   Procedure: PERIPHERAL VASCULAR  INTERVENTION;  Surgeon: Angelia Mould, MD;  Location: [place] CV [facility];  Service: Cardiovascular;  Laterality: Left;   RIGHT HEART CATH N/A 11/23/2017   Procedure: RIGHT HEART CATH;  Surgeon: Larey Dresser, MD;  Location: [place] [facility];  Service: Cardiovascular;  Laterality: N/A;   RIGHT/LEFT HEART CATH AND CORONARY ANGIOGRAPHY N/A 06/15/2019   Procedure: RIGHT/LEFT HEART CATH AND CORONARY ANGIOGRAPHY;  Surgeon: Larey Dresser, MD;  Location: [place] CV [facility];  Service: Cardiovascular;  Laterality: N/A;   HPI: Pt adm 12/27 with SOB and found to be in afib with RVR. On 12/28 pt found unresponsive and transferred to ICU and intubated.  Pt placed on CRRT on 12/29. Head CT and MRI negative. Extubated 12/30. PMH - ESRD on HD, afib, chf, DM, copd, gout. Daughters report some coughing with meals PTA>   Subjective: pt alert but confused    Recommendations for follow up therapy are one component of a multi-disciplinary discharge planning process, led by the attending physician.  Recommendations may be updated based on patient status, additional functional criteria and insurance authorization.  Assessment / Plan / Recommendation  Clinical Impressions 02/21/2021  Clinical Impression Pt presents with a primarily cognitive-based dysphagia that leads to some oral holding and cues needed to manipulate POs/suck from a straw. However, her pharyngeal swallow was WNL.  Larynx was [facility] with good mobility of the vocal folds and no erythema or edema. Pt demonstrated reliable laryngeal vestibule closure with no penetration,  no aspiration when eating purees and drinking thin liquids.  There was good pharyngeal contraction with no residue post-swallow.  Results of FEES were explained to pt's daughter [name] over the phone, including recommendations to begin a dysphagia 1/pureed diet with thin liquids.  [name] verbalized understanding.  RN, Judeen Hammans, assisted with administration of POs during study and  agrees with plan.  SLP will follow.  SLP Visit Diagnosis Dysphagia, unspecified (R13.10)  Attention and concentration deficit following --  Frontal lobe and executive function deficit following --  Impact on safety and function --      Treatment Recommendations 02/21/2021  Treatment Recommendations Therapy as outlined in treatment plan below     Prognosis 02/21/2021  Prognosis for Safe Diet Advancement Good  Barriers to Reach Goals Cognitive deficits  Barriers/Prognosis Comment --    Diet Recommendations 02/21/2021  SLP Diet Recommendations Thin liquid;Dysphagia 1 (Puree) solids  Liquid Administration via Cup;Straw  Medication Administration Whole meds with puree  Compensations Minimize environmental distractions  Postural Changes --      Other Recommendations 02/21/2021  Recommended Consults --  Oral Care Recommendations Oral care BID  Other Recommendations Have oral suction available  Follow Up Recommendations Other (comment)  Assistance recommended at discharge Frequent or constant Supervision/Assistance  Functional Status Assessment Patient has had a recent decline in their functional status and demonstrates the ability to make significant improvements in function in a reasonable and predictable amount of time.    Frequency and Duration  02/21/2021  Speech Therapy Frequency (ACUTE ONLY) min 2x/week  Treatment Duration 2 weeks      Oral Phase 02/21/2021  Oral Phase Impaired  Oral - Pudding Teaspoon --  Oral - Pudding Cup --  Oral - Honey Teaspoon --  Oral - Honey Cup --  Oral - Nectar Teaspoon --  Oral - Nectar Cup --  Oral - Nectar Straw --  Oral - Thin Teaspoon --  Oral - Thin Cup --  Oral - Thin Straw --  Oral - Puree Holding of bolus  Oral - Mech Soft --  Oral - Regular --  Oral - Multi-Consistency --  Oral - Pill --  Oral Phase - Comment --    Pharyngeal Phase 02/21/2021  Pharyngeal Phase WFL  Pharyngeal- Pudding Teaspoon --  Pharyngeal --   Pharyngeal- Pudding Cup --  Pharyngeal --  Pharyngeal- Honey Teaspoon --  Pharyngeal --  Pharyngeal- Honey Cup --  Pharyngeal --  Pharyngeal- Nectar Teaspoon --  Pharyngeal --  Pharyngeal- Nectar Cup --  Pharyngeal --  Pharyngeal- Nectar Straw --  Pharyngeal --  Pharyngeal- Thin Teaspoon --  Pharyngeal --  Pharyngeal- Thin Cup --  Pharyngeal --  Pharyngeal- Thin Straw --  Pharyngeal --  Pharyngeal- Puree --  Pharyngeal --  Pharyngeal- Mechanical Soft --  Pharyngeal --  Pharyngeal- Regular --  Pharyngeal --  Pharyngeal- Multi-consistency --  Pharyngeal --  Pharyngeal- Pill --  Pharyngeal --  Pharyngeal Comment --     No flowsheet data found.   Juan Quam Laurice 02/21/2021, 3:16 PM

## 2021-02-21 NOTE — Progress Notes (Signed)
Speech Language Pathology Treatment: Dysphagia  Patient Details Name: Stephanie Woodward MRN: [id number] DOB: 1940-05-23 Today's Date: 02/21/2021 Time: 1100-1120 SLP Time Calculation (min) (ACUTE ONLY): 20 min  Assessment / Plan / Recommendation Clinical Impression  Pt demonstrates ongoing confusion, but is pleasant and follows directions. She remains on CRRT and therefore cannot be moved to radiology for modified barium swallow study. She was seen today with thin liquids via spoon and straw and purees without difficulty. Specifically, she had no wet vocal quality or throat clearing with liquids as was seen yesterday s/p extubation. She appears ready for instrumental swallow evaluation via FEES for hopeful upgrade to oral diet. At this time, she has no source of nutrition. FEES will be completed as soon as schedule allows. RN informed of plan and in agreement.  Pt may have ice chips for comfort and meds crushed in puree pending results of study.    HPI HPI: COPD, former smoker (quit 15 yrs ago) mild pulmonary hypertension, ESRD, PAF on Eliquis, HFpEF, IDDM DMT2, obesity      SLP Plan  Other (Comment) (FEES)      Recommendations for follow up therapy are one component of a multi-disciplinary discharge planning process, led by the attending physician.  Recommendations may be updated based on patient status, additional functional criteria and insurance authorization.    Recommendations  Medication Administration: Crushed with puree      Oral Care Recommendations: Oral care QID;Oral care prior to ice chip/H20 Follow Up Recommendations:  (tba) Assistance recommended at discharge: Intermittent Supervision/Assistance SLP Visit Diagnosis: Dysphagia, unspecified (R13.10) Plan: Other (Comment) (FEES)         Bonnye Halle P. Regan Llorente, M.S., [place] Pathologist Acute Rehabilitation Services Pager: [place]  02/21/2021, 11:42 AM

## 2021-02-21 NOTE — Progress Notes (Signed)
[facility] Progress Note   Subjective:    IO -280cc yest and 1.0L so far today    Objective Vitals:   02/21/21 0700 02/21/21 0727 02/21/21 0745 02/21/21 0800  BP: (!) 164/49   (!) 145/59  Pulse: 85   89  Resp: 19   14  Temp:   (!) 97.5 F (36.4 C)   TempSrc:   Oral   SpO2: 100% 100%  100%  Weight:      Height:       Physical Exam General: Older female; NAD Neck: +JVD Heart: Irregular; No murmurs, gallops, or rubs Lungs: Clear anteriorly; diminished laterally Abdomen: Large, soft, non-tender Extremities: No edema UE or LE bilat Dialysis Access: LUA BCF +bruit  OP HD:  [place] TTS     4h  350/500  87.2kg  2/2 bath  P2  Hep none   - hect 5 ug tiw   - venofer 50 q wk  - no esa     CXR 12/27 - IMPRESSION: Cardiomegaly with mild pulmonary interstitial edema and trace bilateral pleural effusions suggesting CHF.    CXR 12/29 - IMPRESSION: Cardiomegaly, pulmonary vascular congestion and increased hazy opacities since previous study which may represent increasing edema/infiltrate. '    Assessment/Plan: AMS - delirium. Neuro consulting.  Acute resp insufficiency - extubated 12/30. +fever/ ^wbc/ pulm infiltrates. Started broad-spec IV abx w/ vanc/ zosyn. Family related recent issues of coughing when drinking, wondering if she may be aspirating.  Volume - poss pulm edema and/or pna on CXR's. About 1-2 L off w/ CRRT, still up 1-2kg. Cont 100 cc/hr UF for now, repeat CXR pending.  Hypotension - resolved, off levo gtt now Paroxysmal Afib - cardioverted in ED 12/27, in and out of afib now. HR's 80's. Not on any BB, getting po amio and IV heparin.  ESRD - on HD TTS.  Moved to ICU, CRRT started 12/30.  Anemia of CKD- Hgb 10.1-monitor for now. MBD ckd - Ca 8's, phos 5's. Cont vdra here Nutrition - Renal diet with fluid restriction  Kelly Splinter, MD 02/21/2021, 10:47 AM     Additional Objective Labs: Basic Metabolic Panel: CBC: Recent Labs  Lab 02/18/21 0516  02/19/21 0335 02/19/21 1052 02/19/21 1436 02/20/21 1009 02/21/21 0401  WBC 11.5* 14.7* 13.0*  --  16.6* 16.4*  NEUTROABS  --  12.0* 11.6*  --   --   --   HGB 10.1* 11.0* 10.9* 11.2* 12.0 10.5*  HCT 32.9* 36.3 33.6* 33.0* 36.3 32.7*  MCV 100.3* 100.8* 96.6  --  94.8 95.3  PLT 288 338 271  --  294 288    Recent Labs  Lab 02/20/21 1619 02/20/21 1942 02/20/21 2347 02/21/21 0416 02/21/21 0748  GLUCAP 113* 122* 137* 128* 138*    Iron Studies: No results for input(s): IRON, TIBC, TRANSFERRIN, FERRITIN in the last 72 hours. Lab Results  Component Value Date   INR 1.1 11/17/2018   Medications:   prismasol BGK 4/2.5 400 mL/hr at 02/21/21 0955    prismasol BGK 4/2.5 200 mL/hr at 02/20/21 0936   sodium chloride 10 mL/hr at 02/21/21 1000   sodium chloride     heparin 1,000 Units/hr (02/21/21 1000)   norepinephrine (LEVOPHED) Adult infusion Stopped (02/20/21 2142)   piperacillin-tazobactam Stopped (02/21/21 0658)   prismasol BGK 4/2.5 1,800 mL/hr at 02/21/21 0956   vancomycin Stopped (02/20/21 2121)    amiodarone  200 mg Oral BID   arformoterol  15 mcg Nebulization BID   atorvastatin  80 mg Oral QPM   budesonide (PULMICORT) nebulizer solution  0.5 mg Nebulization BID   chlorhexidine gluconate (MEDLINE KIT)  15 mL Mouth Rinse BID   Chlorhexidine Gluconate Cloth  6 each Topical Q0600   docusate  100 mg Oral BID   insulin aspart  0-9 Units Subcutaneous Q4H   latanoprost  1 drop Both Eyes QHS   lidocaine  1 patch Transdermal Q24H   mouth rinse  15 mL Mouth Rinse 10 times per day   multivitamin  1 tablet Oral QHS   pantoprazole (PROTONIX) IV  40 mg Intravenous Q24H   polyethylene glycol  17 g Oral Daily   QUEtiapine  25 mg Oral QHS   sodium chloride flush  3 mL Intravenous Q12H

## 2021-02-21 NOTE — Progress Notes (Signed)
Patient ID: Stephanie Woodward, female   DOB: [birth year], 80 y.o.   MRN: [id number]     Advanced Heart Failure Rounding Note  PCP-Cardiologist: Ezzard Standing, MD   Subjective:    12/29: Found unresponsive, no arrhythmias noted. Intubated. Started on NE.  MRI brain without acute process. 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  Had fever to 101F. Started on broad spectrum abx and pan cultures sent.  12/30: CVVH started. Extubated.  Echo reviewed: EF 30-35% range, worse function in septum; mild RV dysfunction, IVC dilated.   WBC 11.5>14.7>13>16.  She is afebrile today on vancomycin/Zosyn.   She is awake and alert this morning but confused.  Knows where she is when re-oriented.   She is off NE, stable MAP.  CVVH pulling UF net 75-100 cc/hr.  CVP 7.  She is in NSR in 64s.     Objective:   Weight Range: 88.6 kg Body mass index is 34.6 kg/m.   Vital Signs:   Temp:  [97.5 F (36.4 C)-98.4 F (36.9 C)] 97.5 F (36.4 C) (12/31 0745) Pulse Rate:  [46-105] 85 (12/31 0700) Resp:  [9-29] 19 (12/31 0700) BP: (71-175)/(32-131) 164/49 (12/31 0700) SpO2:  [96 %-100 %] 100 % (12/31 0727) FiO2 (%):  [40 %] 40 % (12/30 0946) Weight:  [88.6 kg] 88.6 kg (12/31 0500) Last BM Date: 02/20/21  Weight change: Filed Weights   02/19/21 0524 02/20/21 0500 02/21/21 0500  Weight: 89.6 kg 89.1 kg 88.6 kg    Intake/Output:   Intake/Output Summary (Last 24 hours) at 02/21/2021 0824 Last data filed at 02/21/2021 0800 Gross per 24 hour  Intake 763.51 ml  Output 2278 ml  Net -1514.49 ml      Physical Exam    General: NAD Neck: No JVD, no thyromegaly or thyroid nodule.  Lungs: Clear to auscultation bilaterally with normal respiratory effort. CV: Nondisplaced PMI.  Heart regular S1/S2, no S3/S4, no murmur.  No peripheral edema.   Abdomen: Soft, nontender, no hepatosplenomegaly, no distention.  Skin: Intact without lesions or  rashes.  Neurologic: Alert but confused.   Psych: Normal affect. Extremities: No clubbing or cyanosis.  HEENT: Normal.    Telemetry   SR 80s, personally reviewed.   Labs    CBC Recent Labs    02/19/21 0335 02/19/21 1052 02/19/21 1436 02/20/21 1009 02/21/21 0401  WBC 14.7* 13.0*  --  16.6* 16.4*  NEUTROABS 12.0* 11.6*  --   --   --   HGB 11.0* 10.9*   < > 12.0 10.5*  HCT 36.3 33.6*   < > 36.3 32.7*  MCV 100.8* 96.6  --  94.8 95.3  PLT 338 271  --  294 288   < > = values in this interval not displayed.   Basic Metabolic Panel Recent Labs    02/20/21 0433 02/20/21 1524 02/21/21 0401  NA 133* 137 136  K 3.8 3.9 4.2  CL 97* 98 101  CO2 _0 GLUCOSE 189* 114* 131*  BUN 33* 22 13  CREATININE 6.63* 4.77* 2.94*  CALCIUM 8.4* 8.1* 8.2*  MG 2.1  --  2.3  PHOS 5.9* 4.0 2.8   Liver Function Tests Recent Labs    02/18/21 1555 02/19/21 0335 02/19/21 1052 02/20/21 0433 02/20/21 1524 02/21/21 0401  AST 18  --  88*  --   --   --   ALT 18  --  52*  --   --   --  ALKPHOS 98  --  116  --   --   --   °BILITOT 0.5  --  1.1  --   --   --   °PROT 7.2  --  6.3*  --   --   --   °ALBUMIN 2.9*   < > 2.5*   < > 2.4* 2.3*  ° < > = values in this interval not displayed.  ° °No results for input(s): LIPASE, AMYLASE in the last 72 hours. °Cardiac Enzymes °No results for input(s): CKTOTAL, CKMB, CKMBINDEX, TROPONINI in the last 72 hours. ° °BNP: °BNP (last 3 results) °Recent Labs  °  06/08/20 °1011  °BNP 402.0*  ° ° °ProBNP (last 3 results) °No results for input(s): PROBNP in the last 8760 hours. ° ° °D-Dimer °No results for input(s): DDIMER in the last 72 hours. °Hemoglobin A1C °No results for input(s): HGBA1C in the last 72 hours. ° °Fasting Lipid Panel °Recent Labs  °  02/20/21 °0433  °TRIG 107  ° °Thyroid Function Tests °Recent Labs  °  02/18/21 °1555  °TSH 1.546  ° ° °Other results: ° ° °Imaging  ° ° °No results found. ° ° °Medications:   ° ° °Scheduled Medications: ° amiodarone  200  mg Oral BID  ° arformoterol  15 mcg Nebulization BID  ° atorvastatin  80 mg Oral QPM  ° budesonide (PULMICORT) nebulizer solution  0.5 mg Nebulization BID  ° chlorhexidine gluconate (MEDLINE KIT)  15 mL Mouth Rinse BID  ° Chlorhexidine Gluconate Cloth  6 each Topical Q0600  ° docusate  100 mg Oral BID  ° insulin aspart  0-9 Units Subcutaneous Q4H  ° latanoprost  1 drop Both Eyes QHS  ° lidocaine  1 patch Transdermal Q24H  ° mouth rinse  15 mL Mouth Rinse 10 times per day  ° multivitamin  1 tablet Oral QHS  ° pantoprazole (PROTONIX) IV  40 mg Intravenous Q24H  ° polyethylene glycol  17 g Oral Daily  ° sodium chloride flush  3 mL Intravenous Q12H  ° ° °Infusions: °  prismasol BGK 4/2.5 400 mL/hr at 02/20/21 0935  °  prismasol BGK 4/2.5 200 mL/hr at 02/20/21 0936  ° sodium chloride 10 mL/hr at 02/21/21 0800  ° sodium chloride    ° heparin 1,050 Units/hr (02/21/21 0800)  ° norepinephrine (LEVOPHED) Adult infusion Stopped (02/20/21 2142)  ° piperacillin-tazobactam Stopped (02/21/21 0658)  ° prismasol BGK 4/2.5 1,800 mL/hr at 02/20/21 1755  ° vancomycin Stopped (02/20/21 2121)  ° ° °PRN Medications: °sodium chloride, acetaminophen, alteplase, heparin, hydrALAZINE, ipratropium-albuterol, ondansetron **OR** ondansetron (ZOFRAN) IV, senna-docusate, sodium chloride ° ° °Assessment/Plan  ° °1.  Atrial fibrillation: Known hx paroxysmal AF.  Seen in ED at [facility] the week PTA for AF and spontaneously converted to SR.  Presented 02/17/21 with AF with RVR. Cardioverted in ER.  She was in junctional rhythm after respiratory arrest and intubation with rate upper 40s-50s, now back in NSR in 80s.  °- Can restart amiodarone 200 mg bid.  °- Continue heparin gtt.  °2. Acute systolic CHF: Echo in 2/18 with EF 50-55%, mild LVH, grade 2 diastolic dysfunction.  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  RHC 11/23/17 showed significantly elevated right and left heart  filling pressures and severe primarily pulmonary venous hypertension. Cardiac output preserved. Echo (10/19) with EF 45-50%, septal-lateral dyssynchrony, RV looked ok.  PYP scan was not suggestive of transthyretin amyloidosis.  Echo in 1/21   with EF 55-60%, normal RV.  RHC/LHC in 4/21 with no significant CAD, normal filling pressures and cardiac output.  Echo this admission shows EF down to 35%.  HS-TnI 385 => 378, likely demand ischemia with hypotension/respiratory arrest.  Today, she is off NE with CVP 7 on CVVH.  - Do not need aggressive UF at this point, would pull net 50 cc/hr for now.  - Fall in EF may be stress cardiomyopathy, but would recommend cath at some point given known vascular disease to rule out significant CAD as cause of cardiomyopathy (?Tuesday).  3. Acute respiratory failure:  Minimally responsive 12/29 and intubated for airway protection.  Extubated 12/30, stable today. Possible aspiration event. Chest x-ray with increasing LLL airspace disease.  - Vancomycin/Zosyn per CCM.  4. Acute encephalopathy: Head CT and MRI brain without acute process. Bilateral intracranial ICA stenosis (but no significant extracranial disease). EEG with no seizure activity. Still confused/delirious.  5. ESRD: CRRT per renal. Followed by Dr. Hollie Salk as outpatient - Currently on CVVH, may be able to transition to [facility] soon (CVP 7, BP stable).  6. Pulmonary HTN: Mild PH on 4/21 RHC.  7. Hyperlipidemia: on atorvastatin 80.  8. DM II: A1c 9.1 - Per primary team 9. ID: Elevated WBCs, fever on 12/29.  ?Aspiration PNA.  - On empiric abx with vanc/zosyn  CRITICAL CARE Performed by: Loralie Champagne  Total critical care time: 35 minutes  Critical care time was exclusive of separately billable procedures and treating other patients.  Critical care was necessary to treat or prevent imminent or life-threatening deterioration.  Critical care was time spent personally by me on the following activities: development  of treatment plan with patient and/or surrogate as well as nursing, discussions with consultants, evaluation of patient's response to treatment, examination of patient, obtaining history from patient or surrogate, ordering and performing treatments and interventions, ordering and review of laboratory studies, ordering and review of radiographic studies, pulse oximetry and re-evaluation of patient's condition.   Loralie Champagne 02/21/2021 8:24 AM

## 2021-02-21 NOTE — Progress Notes (Signed)
[place] for apixaban>heparin infusion Indication: atrial fibrillation  Allergies  Allergen Reactions   Eggs Or Egg-Derived Products Nausea And Vomiting   Lisinopril Nausea And Vomiting   Penicillins Nausea And Vomiting    Has patient had a PCN reaction causing immediate rash, facial/tongue/throat swelling, SOB or lightheadedness with hypotension: No Has patient had a PCN reaction causing severe rash involving mucus membranes or skin necrosis: No Has patient had a PCN reaction that required hospitalization: No Has patient had a PCN reaction occurring within the last 10 years: No If all of the above answers are "NO", then may proceed with Cephalosporin use.    Other Other (See Comments)    Patient Measurements: Height: '5\' 3"'  (160 cm) Weight: 88.6 kg (195 lb 5.2 oz) IBW/kg (Calculated) : 52.4 Heparin Dosing Weight: 72.7 kg   Vital Signs: Temp: 97.5 F (36.4 C) (12/31 0745) Temp Source: Oral (12/31 0745) BP: 145/59 (12/31 0800) Pulse Rate: 89 (12/31 0800)  Labs: Recent Labs    02/19/21 1052 02/19/21 1436 02/19/21 1841 02/19/21 1841 02/20/21 0433 02/20/21 1009 02/20/21 1113 02/20/21 1524 02/20/21 1712 02/21/21 0401  HGB 10.9* 11.2*  --   --   --  12.0  --   --   --  10.5*  HCT 33.6* 33.0*  --   --   --  36.3  --   --   --  32.7*  PLT 271  --   --   --   --  294  --   --   --  288  APTT  --   --  119*   < > 49*  --   --   --  61* 105*  HEPARINUNFRC  --   --  >1.10*  --  >1.10*  --   --   --   --  >1.10*  CREATININE 5.78*  --   --   --  6.63*  --   --  4.77*  --  2.94*  TROPONINIHS  --   --   --   --   --  385* 378*  --   --   --    < > = values in this interval not displayed.     Estimated Creatinine Clearance: 16.7 mL/min (A) (by C-G formula based on SCr of 2.94 mg/dL (H)).   Medical History: Past Medical History:  Diagnosis Date   Arthritis    Asthma    Atrial fibrillation (HCC)    CHF (congestive heart failure)  (HCC)    CKD (chronic kidney disease), stage III (HCC)    COPD (chronic obstructive pulmonary disease) (HCC)    Diabetes mellitus    INSULIN DEPENDENT   Gout    Heart murmur    no issues per pt   History of kidney stones    Hyperlipemia    Hypertension    Psoriasis    Renal disorder    congenital   Single kidney    Sleep apnea    doesn't use the Cpap    Medications:  Scheduled:   amiodarone  200 mg Oral BID   arformoterol  15 mcg Nebulization BID   atorvastatin  80 mg Oral QPM   budesonide (PULMICORT) nebulizer solution  0.5 mg Nebulization BID   chlorhexidine gluconate (MEDLINE KIT)  15 mL Mouth Rinse BID   Chlorhexidine Gluconate Cloth  6 each Topical Q0600   docusate  100 mg Oral BID   insulin aspart  0-9 Units Subcutaneous Q4H   latanoprost  1 drop Both Eyes QHS   lidocaine  1 patch Transdermal Q24H   mouth rinse  15 mL Mouth Rinse 10 times per day   multivitamin  1 tablet Oral QHS   pantoprazole (PROTONIX) IV  40 mg Intravenous Q24H   polyethylene glycol  17 g Oral Daily   QUEtiapine  25 mg Oral QHS   sodium chloride flush  3 mL Intravenous Q12H    Assessment: 19 yof presenting with Afib - on apixaban PTA for hx Afib. Last dose on 12/28'@2245' .   Transferred from ICU due to unresponsiveness - now intubated. CT head showing no bleed but moderate chronic microvascular ischemia disease and severe L/moderate R intracranial ICA stenosis. Given recent DOAC, will need to monitor both aPTT and heparin level until correlate.   Aptt this AM just slightly above goal at 105 seconds.  RN unsure where level drawn from.  Hgb trending down, but no overt bleeding or complications noted.  Goal of Therapy:  Heparin level 0.3-0.7 units/ml aPTT 66-102 seconds Monitor platelets by anticoagulation protocol: Yes   Plan:  Decrease IV heparin to 1000 units/hr. Recheck heparin level,  aPTT, and CBC with am labs   [place] Crane, Roylene Reason, [facility] Clinical Pharmacist   02/21/2021 8:44 AM   [facility] pharmacy phone numbers are listed on [email or web address]

## 2021-02-21 NOTE — Evaluation (Signed)
Physical Therapy Evaluation Patient Details Name: Stephanie Woodward MRN: [id number] DOB: Aug 12, 1940 Today's Date: 02/21/2021  History of Present Illness  Pt adm 12/27 with SOB and found to be in afib with RVR. On 12/28 pt found unresponsive and transferred to ICU and intubated.  Pt placed on CRRT on 12/29. Head CT and MRI negative. Extubated 12/30. PMH - ESRD on HD, afib, chf, DM, copd, gout  Clinical Impression  Pt presents to PT with decreased mobility due to decreased strength, decreased balance, and decreased functional activity tolerance. Pt has intermittent support at home so will likely need SNF at DC unless function improves rapidly.         Recommendations for follow up therapy are one component of a multi-disciplinary discharge planning process, led by the attending physician.  Recommendations may be updated based on patient status, additional functional criteria and insurance authorization.  Follow Up Recommendations Skilled nursing-short term rehab (<3 hours/day)    Assistance Recommended at Discharge Frequent or constant Supervision/Assistance  Functional Status Assessment Patient has had a recent decline in their functional status and demonstrates the ability to make significant improvements in function in a reasonable and predictable amount of time.  Equipment Recommendations  Wheelchair (measurements PT);Wheelchair cushion (measurements PT)    Recommendations for Other Services       Precautions / Restrictions Precautions Precautions: Fall Precaution Comments: CRRT foley Aline      Mobility  Bed Mobility Overal bed mobility: Needs Assistance Bed Mobility: Supine to Sit;Sit to Supine     Supine to sit: +2 for physical assistance;Total assist Sit to supine: +2 for physical assistance;Total assist   General bed mobility comments: Assist for all aspects    Transfers Overall transfer level: Needs assistance Equipment used: 2 person hand held assist Transfers:  Sit to/from Stand Sit to Stand: +2 physical assistance;Max assist           General transfer comment: Pt with partial stand with buttocks off bed 6" with assist to bring hips up and balance    Ambulation/Gait               General Gait Details: Unable  Stairs            Wheelchair Mobility    Modified Rankin (Stroke Patients Only)       Balance Overall balance assessment: Needs assistance Sitting-balance support: No upper extremity supported;Feet unsupported Sitting balance-Leahy Scale: Fair                                       Pertinent Vitals/Pain Pain Assessment: Faces Faces Pain Scale: Hurts a little bit Pain Location: R hip Pain Descriptors / Indicators: Discomfort    Home Living Family/patient expects to be discharged to:: Private residence (Independent senior living) Living Arrangements: Alone Available Help at Discharge: Family;Available PRN/intermittently Type of Home: Independent living facility Home Access: Level entry       Home Layout: One level Home Equipment: Merchant [employer] officer (4 wheels) Additional Comments: Lives in senior living independent apartment    Prior Function Prior Level of Function : Needs assist       Physical Assist : ADLs (physical)   ADLs (physical): Dressing Mobility Comments: modified independent using rollator. Distance limite due to SOB and back pain. Will sit on rollator and push herself backwards using her feet. Goes up stairs at daughter's home using hands on steps in front of her.  ADLs Comments: Performs her own bathing and dressing. Needs assist to get her coat on due to shoulder issues. Fatigues quickly with standing to cook     Hand Dominance   Dominant Hand: Right    Extremity/Trunk Assessment   Upper Extremity Assessment Upper Extremity Assessment: Defer to OT evaluation    Lower Extremity Assessment Lower Extremity Assessment: Generalized weakness       Communication    Communication: No difficulties  Cognition Arousal/Alertness: Awake/alert Behavior During Therapy: Flat affect Overall Cognitive Status: Impaired/Different from baseline Area of Impairment: Orientation;Attention;Memory;Following commands;Safety/judgement;Awareness;Problem solving                 Orientation Level: Disoriented to;Time Current Attention Level: Focused Memory: Decreased short-term memory Following Commands: Follows one step commands inconsistently;Follows one step commands with increased time Safety/Judgement: Decreased awareness of safety;Decreased awareness of deficits Awareness: Intellectual Problem Solving: Slow processing;Decreased initiation;Requires verbal cues;Requires tactile cues General Comments: its November its Christmas.        General Comments General comments (skin integrity, edema, etc.): VSS    Exercises     Assessment/Plan    PT Assessment Patient needs continued PT services  PT Problem List Decreased strength;Decreased activity tolerance;Decreased balance;Decreased mobility;Decreased cognition;Decreased safety awareness       PT Treatment Interventions DME instruction;Gait training;Functional mobility training;Therapeutic activities;Therapeutic exercise;Balance training;Patient/family education;Cognitive remediation    PT Goals (Current goals can be found in the Care Plan section)  Acute Rehab PT Goals Patient Stated Goal: not stated PT Goal Formulation: With patient Time For Goal Achievement: 03/07/21 Potential to Achieve Goals: Fair    Frequency Min 3X/week   Barriers to discharge Decreased caregiver support Lives alone    Co-evaluation   Reason for Co-Treatment: Complexity of the patient's impairments (multi-system involvement);Necessary to address cognition/behavior during functional activity;For patient/therapist safety;To address functional/ADL transfers   OT goals addressed during session: ADL's and self-care;Proper  use of Adaptive equipment and DME;Strengthening/ROM       AM-PAC PT "6 Clicks" Mobility  Outcome Measure Help needed turning from your back to your side while in a flat bed without using bedrails?: Total Help needed moving from lying on your back to sitting on the side of a flat bed without using bedrails?: Total Help needed moving to and from a bed to a chair (including a wheelchair)?: Total Help needed standing up from a chair using your arms (e.g., wheelchair or bedside chair)?: Total Help needed to walk in hospital room?: Total Help needed climbing 3-5 steps with a railing? : Total 6 Click Score: 6    End of Session   Activity Tolerance: Patient tolerated treatment well Patient left: in bed;with bed alarm set Nurse Communication: Mobility status PT Visit Diagnosis: Other abnormalities of gait and mobility (R26.89);Muscle weakness (generalized) (M62.81);Difficulty in walking, not elsewhere classified (R26.2)    Time: 8341-9622 PT Time Calculation (min) (ACUTE ONLY): 21 min   Charges:   PT Evaluation $PT Eval Moderate Complexity: [place] Pager [phone] [place] 02/21/2021, 10:42 AM

## 2021-02-21 NOTE — Evaluation (Signed)
Occupational Therapy Evaluation Patient Details Name: Stephanie Woodward MRN: [id number] DOB: 07-07-1940 Today's Date: 02/21/2021   History of Present Illness Pt adm 12/27 with SOB and found to be in afib with RVR. On 12/28 pt found unresponsive and transferred to ICU and intubated.  Pt placed on CRRT on 12/29. Head CT and MRI negative. Extubated 12/30. PMH - ESRD on HD, afib, chf, DM, copd, gout   Clinical Impression   PT admitted with afib with RVR. Pt currently with functional limitiations due to the deficits listed below (see OT problem list). Pt currently with cognitive deficits and generalized weakness. Pt requires CRRT so limited evaluation to eob and one squat stand attempt.  Pt will benefit from skilled OT to increase their independence and safety with adls and balance to allow discharge SNF.       Recommendations for follow up therapy are one component of a multi-disciplinary discharge planning process, led by the attending physician.  Recommendations may be updated based on patient status, additional functional criteria and insurance authorization.   Follow Up Recommendations  Skilled nursing-short term rehab (<3 hours/day)    Assistance Recommended at Discharge Intermittent Supervision/Assistance  Functional Status Assessment  Patient has had a recent decline in their functional status and demonstrates the ability to make significant improvements in function in a reasonable and predictable amount of time.  Equipment Recommendations  None recommended by OT    Recommendations for Other Services       Precautions / Restrictions Precautions Precautions: Fall Precaution Comments: CRRT foley Aline      Mobility Bed Mobility Overal bed mobility: Needs Assistance Bed Mobility: Supine to Sit;Sit to Supine     Supine to sit: +2 for physical assistance;Total assist Sit to supine: +2 for physical assistance;Total assist   General bed mobility comments: Assist for all aspects     Transfers Overall transfer level: Needs assistance Equipment used: 2 person hand held assist Transfers: Sit to/from Stand Sit to Stand: +2 physical assistance;Max assist           General transfer comment: Pt with partial stand with buttocks off bed 6" with assist to bring hips up and balance      Balance Overall balance assessment: Needs assistance Sitting-balance support: No upper extremity supported;Feet unsupported Sitting balance-Leahy Scale: Fair                                     ADL either performed or assessed with clinical judgement   ADL                                               Vision Baseline Vision/History: 0 No visual deficits Additional Comments: difficult to assess due to cognition no deficits noted at this time     Perception     Praxis      Pertinent Vitals/Pain Pain Assessment: Faces Faces Pain Scale: Hurts a little bit Pain Location: R hip Pain Descriptors / Indicators: Discomfort Pain Intervention(s): Monitored during session;Repositioned     Hand Dominance Right   Extremity/Trunk Assessment Upper Extremity Assessment Upper Extremity Assessment: Generalized weakness;RUE deficits/detail;LUE deficits/detail RUE Deficits / Details: tremors noted with sitting. RUE Coordination: decreased fine motor;decreased gross motor LUE Deficits / Details: completed oral care with L Ue. pt noted to have tremors.  pt with decreased grasp but able to hold swab LUE Coordination: decreased fine motor;decreased gross motor   Lower Extremity Assessment Lower Extremity Assessment: Generalized weakness   Cervical / Trunk Assessment Cervical / Trunk Assessment: Normal   Communication Communication Communication: No difficulties   Cognition Arousal/Alertness: Awake/alert Behavior During Therapy: Flat affect Overall Cognitive Status: Impaired/Different from baseline Area of Impairment:  Orientation;Attention;Memory;Following commands;Safety/judgement;Awareness;Problem solving                 Orientation Level: Disoriented to;Time Current Attention Level: Focused Memory: Decreased short-term memory Following Commands: Follows one step commands inconsistently;Follows one step commands with increased time Safety/Judgement: Decreased awareness of safety;Decreased awareness of deficits Awareness: Intellectual Problem Solving: Slow processing;Decreased initiation General Comments: its November its Christmas. pt unable to complete fist with L hand when verbal cues but when presented with a oral swab closes on the object automatic.     General Comments  VSS HR 90-100 on CRRT    Exercises     Shoulder Instructions      Home Living Family/patient expects to be discharged to:: Private residence Living Arrangements: Alone Available Help at Discharge: Family;Available PRN/intermittently Type of Home: Independent living facility Home Access: Level entry Entrance Stairs-Number of Steps: 3 Entrance Stairs-Rails: Can reach both Home Layout: One level     Bathroom Shower/Tub: Occupational psychologist: Handicapped height Bathroom Accessibility: Yes   Home Equipment: Merchant navy officer (4 wheels)   Additional Comments: Lives in senior living independent apartment      Prior Functioning/Environment Prior Level of Function : Needs assist       Physical Assist : ADLs (physical)   ADLs (physical): Dressing Mobility Comments: modified independent using rollator. Distance limited due to SOB and back pain. Will sit on rollator and push herself backwards using her feet. Goes up stairs at daughter's home using hands on steps in front of her. ADLs Comments: Performs her own bathing and dressing. Needs assist to get her coat on due to shoulder issues. Fatigues quickly with standing to cook        OT Problem List: Decreased strength;Decreased activity  tolerance;Impaired balance (sitting and/or standing);Decreased cognition;Decreased safety awareness;Decreased knowledge of use of DME or AE;Decreased knowledge of precautions;Cardiopulmonary status limiting activity      OT Treatment/Interventions: Self-care/ADL training;Therapeutic exercise;Neuromuscular education;Energy conservation;DME and/or AE instruction;Manual therapy;Therapeutic activities;Cognitive remediation/compensation;Patient/family education;Balance training    OT Goals(Current goals can be found in the care plan section) Acute Rehab OT Goals Patient Stated Goal: none stated OT Goal Formulation: Patient unable to participate in goal setting Time For Goal Achievement: 03/07/21 Potential to Achieve Goals: Good ADL Goals Pt Will Perform Grooming: with supervision;sitting Pt Will Transfer to Toilet: with min assist;bedside commode;stand pivot transfer Additional ADL Goal #1: pt will follow 2 step command 50% of session Additional ADL Goal #2: pt will complete bed mobility mod (A) as precursor to adls  OT Frequency: Min 2X/week   Barriers to D/C: Decreased caregiver support          Co-evaluation PT/OT/SLP Co-Evaluation/Treatment: Yes Reason for Co-Treatment: Complexity of the patient's impairments (multi-system involvement);Necessary to address cognition/behavior during functional activity;For patient/therapist safety;To address functional/ADL transfers   OT goals addressed during session: ADL's and self-care;Proper use of Adaptive equipment and DME;Strengthening/ROM      AM-PAC OT "6 Clicks" Daily Activity     Outcome Measure Help from another person eating meals?: A Lot Help from another person taking care of personal grooming?: A Lot Help from another person  toileting, which includes using toliet, bedpan, or urinal?: A Lot Help from another person bathing (including washing, rinsing, drying)?: A Lot Help from another person to put on and taking off regular upper body  clothing?: A Lot Help from another person to put on and taking off regular lower body clothing?: A Lot 6 Click Score: 12   End of Session Equipment Utilized During Treatment: Oxygen Nurse Communication: Mobility status;Precautions  Activity Tolerance: Patient tolerated treatment well Patient left: in chair;with call bell/phone within reach;with chair alarm set  OT Visit Diagnosis: Unsteadiness on feet (R26.81);Muscle weakness (generalized) (M62.81)                Time: 7703-4035 OT Time Calculation (min): 19 min Charges:  OT General Charges $OT Visit: 1 Visit OT Evaluation $OT Eval High Complexity: 1 High   Brynn, OTR/L  Acute Rehabilitation Services Pager: [phone] Office: [phone] .   Jeri Modena 02/21/2021, 11:01 AM

## 2021-02-22 ENCOUNTER — Inpatient Hospital Stay (HOSPITAL_COMMUNITY): Payer: HMO

## 2021-02-22 ENCOUNTER — Encounter: Payer: Self-pay | Admitting: Internal Medicine

## 2021-02-22 DIAGNOSIS — I480 Paroxysmal atrial fibrillation: Secondary | ICD-10-CM | POA: Diagnosis not present

## 2021-02-22 DIAGNOSIS — I5033 Acute on chronic diastolic (congestive) heart failure: Secondary | ICD-10-CM | POA: Diagnosis not present

## 2021-02-22 LAB — RENAL FUNCTION PANEL
Albumin: 2.5 g/dL — ABNORMAL LOW (ref 3.5–5.0)
Anion gap: 13 (ref 5–15)
BUN: 7 mg/dL — ABNORMAL LOW (ref 8–23)
CO2: 22 mmol/L (ref 22–32)
Calcium: 8.6 mg/dL — ABNORMAL LOW (ref 8.9–10.3)
Chloride: 100 mmol/L (ref 98–111)
Creatinine, Ser: 1.71 mg/dL — ABNORMAL HIGH (ref 0.44–1.00)
GFR, Estimated: 30 mL/min — ABNORMAL LOW (ref >60)
Glucose, Bld: 132 mg/dL — ABNORMAL HIGH (ref 70–99)
Phosphorus: 2 mg/dL — ABNORMAL LOW (ref 2.5–4.6)
Potassium: 4 mmol/L (ref 3.5–5.1)
Sodium: 135 mmol/L (ref 135–145)

## 2021-02-22 LAB — VANCOMYCIN, RANDOM: Vancomycin Rm: 31

## 2021-02-22 LAB — POCT I-STAT 7, (LYTES, BLD GAS, ICA,H+H)
Acid-base deficit: 2 mmol/L (ref 0.0–2.0)
Bicarbonate: 24 mmol/L (ref 20.0–28.0)
Calcium, Ion: 1.17 mmol/L (ref 1.15–1.40)
HCT: 34 % — ABNORMAL LOW (ref 36.0–46.0)
Hemoglobin: 11.6 g/dL — ABNORMAL LOW (ref 12.0–15.0)
O2 Saturation: 92 %
Patient temperature: 97.8
Potassium: 4.1 mmol/L (ref 3.5–5.1)
Sodium: 136 mmol/L (ref 135–145)
TCO2: 25 mmol/L (ref 22–32)
pCO2 arterial: 43.9 mmHg (ref 32.0–48.0)
pH, Arterial: 7.343 — ABNORMAL LOW (ref 7.350–7.450)
pO2, Arterial: 65 mmHg — ABNORMAL LOW (ref 83.0–108.0)

## 2021-02-22 LAB — GLUCOSE, CAPILLARY
Glucose-Capillary: 121 mg/dL — ABNORMAL HIGH (ref 70–99)
Glucose-Capillary: 125 mg/dL — ABNORMAL HIGH (ref 70–99)
Glucose-Capillary: 174 mg/dL — ABNORMAL HIGH (ref 70–99)
Glucose-Capillary: 183 mg/dL — ABNORMAL HIGH (ref 70–99)
Glucose-Capillary: 145 mg/dL — ABNORMAL HIGH (ref 70–99)
Glucose-Capillary: 203 mg/dL — ABNORMAL HIGH (ref 70–99)

## 2021-02-22 LAB — APTT: aPTT: 84 seconds — ABNORMAL HIGH (ref 24–36)

## 2021-02-22 LAB — HEPARIN LEVEL (UNFRACTIONATED): Heparin Unfractionated: 0.89 IU/mL — ABNORMAL HIGH (ref 0.30–0.70)

## 2021-02-22 LAB — MAGNESIUM: Magnesium: 2.6 mg/dL — ABNORMAL HIGH (ref 1.7–2.4)

## 2021-02-22 MED ORDER — ALBUMIN HUMAN 25 % IV SOLN
25.0000 g | Freq: Four times a day (QID) | INTRAVENOUS | Status: DC
  Administered 2021-02-22: 25 g via INTRAVENOUS
  Filled 2021-02-22: qty 100

## 2021-02-22 MED ORDER — NOREPINEPHRINE 4 MG/250ML-% IV SOLN
0.0000 ug/min | INTRAVENOUS | Status: DC
  Administered 2021-02-23: 7 ug/min via INTRAVENOUS

## 2021-02-22 MED ORDER — DOXERCALCIFEROL 4 MCG/2ML IV SOLN
5.0000 ug | INTRAVENOUS | Status: DC
  Filled 2021-02-22: qty 4

## 2021-02-22 MED ORDER — VANCOMYCIN VARIABLE DOSE PER UNSTABLE RENAL FUNCTION (PHARMACIST DOSING): Status: DC

## 2021-02-22 MED ORDER — PIPERACILLIN-TAZOBACTAM IN DEX 2-0.25 GM/50ML IV SOLN
2.2500 g | Freq: Three times a day (TID) | INTRAVENOUS | Status: DC
  Administered 2021-02-22 – 2021-02-24 (×5): 2.25 g via INTRAVENOUS
  Filled 2021-02-22 (×6): qty 50

## 2021-02-22 NOTE — Progress Notes (Signed)
Pharmacy Antibiotic Note  Lovinia Woodward is a 81 y.o. female admitted on 02/17/2021 with sepsis.  Pharmacy has been consulted for vancomycin and zosyn dosing.  Was found unresponsive, requiring transfer to ICU for intubation to protect airway. WBC 14.7, temp 100.7. Scr 5.07 (CrCl 9.7 mL/min) - known hx of ESRD.   CRRT stopped around 10 AM today.  Last vancomycin dose last night ~ 8 pm.  Random vancomycin level = 31.  Plan: Hold vancomycin for now, will re-dose after next HD session depending on how much she tolerates. Change Zosyn to 2.25g IV q 8hrs since off CRRT.  Height: 5\' 3"  (160 cm) Weight: 84.1 kg (185 lb 6.5 oz) IBW/kg (Calculated) : 52.4  Temp (24hrs), Avg:97.6 F (36.4 C), Min:96.5 F (35.8 C), Max:98.3 F (36.8 C)  Recent Labs  Lab 02/18/21 0516 02/19/21 0335 02/19/21 1052 02/20/21 0433 02/20/21 1009 02/20/21 1524 02/21/21 0401 02/21/21 1610 02/22/21 0351 02/22/21 1257  WBC 11.5* 14.7* 13.0*  --  16.6*  --  16.4*  --   --   --   CREATININE 5.77* 5.07* 5.78* 6.63*  --  4.77* 2.94* 2.08* 1.71*  --   VANCORANDOM  --   --   --   --   --   --   --   --   --  31     Estimated Creatinine Clearance: 27.9 mL/min (A) (by C-G formula based on SCr of 1.71 mg/dL (H)).    Allergies  Allergen Reactions   Eggs Or Egg-Derived Products Nausea And Vomiting   Lisinopril Nausea And Vomiting   Penicillins Nausea And Vomiting    Has patient had a PCN reaction causing immediate rash, facial/tongue/throat swelling, SOB or lightheadedness with hypotension: No Has patient had a PCN reaction causing severe rash involving mucus membranes or skin necrosis: No Has patient had a PCN reaction that required hospitalization: No Has patient had a PCN reaction occurring within the last 10 years: No If all of the above answers are "NO", then may proceed with Cephalosporin use.    Other Other (See Comments)    Antimicrobials this admission: Vancomycin 12/29 >>  Zosyn 12/29 >>   Dose  adjustments this admission: N/A  Microbiology results: Sputum 12/29: reincubated  Bcx 12/29: ngtd COVID/Flu PCR 12/27: neg   Thank you for allowing pharmacy to be a part of this patients care.  [place] Crane, Roylene Reason, BCCP Clinical Pharmacist  02/22/2021 1:56 PM   [facility] pharmacy phone numbers are listed on [email or web address]

## 2021-02-22 NOTE — Progress Notes (Signed)
Patient ID: Stephanie Woodward, female   DOB: [birth year], 81 y.o.   MRN: [id number]     Advanced Heart Failure Rounding Note  PCP-Cardiologist: Ezzard Standing, MD   Subjective:    12/29: Found unresponsive, no arrhythmias noted. Intubated. Started on NE.  MRI brain without acute process. 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  Had fever to 101F. Started on broad spectrum abx and pan cultures sent.  12/30: CVVH started. Extubated.  Echo reviewed: EF 30-35% range, worse function in septum; mild RV dysfunction, IVC dilated.   She is afebrile today on vancomycin/Zosyn.   She is awake and alert this morning, oriented to place.  Per her daughter, less confused.   She is off NE, stable MAP.  CVVH pulling UF net 75-100 cc/hr over the last day with weight down.  CVP < 5.  She is in NSR in 84s.     Objective:   Weight Range: 84.1 kg Body mass index is 32.84 kg/m.   Vital Signs:   Temp:  [96.5 F (35.8 C)-97.8 F (36.6 C)] 97.8 F (36.6 C) (01/01 0734) Pulse Rate:  [55-96] 68 (01/01 0719) Resp:  [14-25] 18 (01/01 0719) BP: (109-159)/(37-107) 114/49 (01/01 0700) SpO2:  [93 %-100 %] 96 % (01/01 0719) Weight:  [84.1 kg] 84.1 kg (01/01 0545) Last BM Date: 02/22/21  Weight change: Filed Weights   02/20/21 0500 02/21/21 0500 02/22/21 0545  Weight: 89.1 kg 88.6 kg 84.1 kg    Intake/Output:   Intake/Output Summary (Last 24 hours) at 02/22/2021 0859 Last data filed at 02/22/2021 0800 Gross per 24 hour  Intake 799.89 ml  Output 3745 ml  Net -2945.11 ml      Physical Exam    General: NAD Neck: No JVD, no thyromegaly or thyroid nodule.  Lungs: Clear to auscultation bilaterally with normal respiratory effort. CV: Nondisplaced PMI.  Heart regular S1/S2, no S3/S4, no murmur.  No peripheral edema.   Abdomen: Soft, nontender, no hepatosplenomegaly, no distention.  Skin: Intact without lesions or rashes.  Neurologic: Alert,  mild confusion but can be re-directed easily Psych: Normal affect. Extremities: No clubbing or cyanosis.  HEENT: Normal.   Telemetry   SR 80s, personally reviewed.   Labs    CBC Recent Labs    02/19/21 1052 02/19/21 1436 02/20/21 1009 02/21/21 0401 02/22/21 0830  WBC 13.0*  --  16.6* 16.4*  --   NEUTROABS 11.6*  --   --   --   --   HGB 10.9*   < > 12.0 10.5* 11.6*  HCT 33.6*   < > 36.3 32.7* 34.0*  MCV 96.6  --  94.8 95.3  --   PLT 271  --  294 288  --    < > = values in this interval not displayed.   Basic Metabolic Panel Recent Labs    02/21/21 0401 02/21/21 1610 02/22/21 0351 02/22/21 0830  NA 136 138 135 136  K 4.2 4.3 4.0 4.1  CL 101 99 100  --   CO2 '23 24 22  ' --   GLUCOSE 131* 124* 132*  --   BUN 13 7* 7*  --   CREATININE 2.94* 2.08* 1.71*  --   CALCIUM 8.2* 8.9 8.6*  --   MG 2.3  --  2.6*  --   PHOS 2.8 2.5 2.0*  --    Liver Function Tests Recent Labs    02/19/21 1052 02/20/21 0433 02/21/21 1610 02/22/21 0351  AST 88*  --   --   --   ALT 52*  --   --   --   ALKPHOS 116  --   --   --   BILITOT 1.1  --   --   --   PROT 6.3*  --   --   --   ALBUMIN 2.5*   < > 2.4* 2.5*   < > = values in this interval not displayed.   No results for input(s): LIPASE, AMYLASE in the last 72 hours. Cardiac Enzymes No results for input(s): CKTOTAL, CKMB, CKMBINDEX, TROPONINI in the last 72 hours.  BNP: BNP (last 3 results) Recent Labs    06/08/20 1011  BNP 402.0*    ProBNP (last 3 results) No results for input(s): PROBNP in the last 8760 hours.   D-Dimer No results for input(s): DDIMER in the last 72 hours. Hemoglobin A1C No results for input(s): HGBA1C in the last 72 hours.  Fasting Lipid Panel Recent Labs    02/20/21 0433  TRIG 107   Thyroid Function Tests No results for input(s): TSH, T4TOTAL, T3FREE, THYROIDAB in the last 72 hours.  Invalid input(s): FREET3   Other results:   Imaging    DG CHEST PORT 1 VIEW  Result Date:  02/21/2021 CLINICAL DATA:  Atrial fibrillation. EXAM: PORTABLE CHEST 1 VIEW COMPARISON:  February 19, 2021. FINDINGS: Stable cardiomegaly. Endotracheal and nasogastric tubes have been removed. Right internal jugular catheter is unchanged. Mild bibasilar interstitial densities are noted concerning for edema or possibly atelectasis. Minimal pleural effusions may be present. Bony thorax is unremarkable. IMPRESSION: Stable cardiomegaly. Mild bibasilar interstitial densities are noted concerning for edema or atelectasis. Minimal pleural effusions are noted. Electronically Signed   By: Marijo Conception M.D.   On: 02/21/2021 14:58     Medications:     Scheduled Medications:  amiodarone  200 mg Oral BID   arformoterol  15 mcg Nebulization BID   atorvastatin  80 mg Oral QPM   budesonide (PULMICORT) nebulizer solution  0.5 mg Nebulization BID   chlorhexidine gluconate (MEDLINE KIT)  15 mL Mouth Rinse BID   Chlorhexidine Gluconate Cloth  6 each Topical Q0600   docusate  100 mg Oral BID   insulin aspart  0-9 Units Subcutaneous Q4H   latanoprost  1 drop Both Eyes QHS   lidocaine  1 patch Transdermal Q24H   mouth rinse  15 mL Mouth Rinse 10 times per day   multivitamin  1 tablet Oral QHS   pantoprazole (PROTONIX) IV  40 mg Intravenous Q24H   polyethylene glycol  17 g Oral Daily   QUEtiapine  25 mg Oral QHS   sodium chloride flush  3 mL Intravenous Q12H    Infusions:   prismasol BGK 4/2.5 400 mL/hr at 02/21/21 0955    prismasol BGK 4/2.5 200 mL/hr at 02/21/21 1118   sodium chloride Stopped (02/22/21 0739)   sodium chloride     albumin human     heparin 1,000 Units/hr (02/22/21 0800)   norepinephrine (LEVOPHED) Adult infusion 12 mcg/min (02/22/21 0836)   piperacillin-tazobactam Stopped (02/22/21 0559)   prismasol BGK 4/2.5 1,800 mL/hr at 02/22/21 0349   vancomycin Stopped (02/21/21 2052)    PRN Medications: sodium chloride, acetaminophen, alteplase, heparin, hydrALAZINE, ipratropium-albuterol,  ondansetron **OR** ondansetron (ZOFRAN) IV, senna-docusate, sodium chloride   Assessment/Plan   1.  Atrial fibrillation: Known hx paroxysmal AF.  Seen in ED at [facility] the week PTA for AF and spontaneously converted to  SR.  Presented 02/17/21 with AF with RVR. Cardioverted in ER.  She was in junctional rhythm after respiratory arrest and intubation with rate upper 40s-50s, now back in NSR in 80s.  - Continue amiodarone 200 mg bid.  - Continue heparin gtt.  2. Acute systolic CHF: Echo in 3/47 with EF 50-55%, mild LVH, grade 2 diastolic dysfunction.  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  [place] 11/23/17 showed significantly elevated right and left heart filling pressures and severe primarily pulmonary venous hypertension. Cardiac output preserved. Echo (10/19) with EF 45-50%, septal-lateral dyssynchrony, RV looked ok.  PYP scan was not suggestive of transthyretin amyloidosis.  Echo in 1/21 with EF 55-60%, normal RV.  RHC/LHC in 4/21 with no significant CAD, normal filling pressures and cardiac output.  Echo this admission shows EF down to 35%.  HS-TnI 385 => 378, likely demand ischemia with hypotension/respiratory arrest.  Today, she is off NE with CVP <5 on CVVH.  - Think we can stop CVVH, transition back over to iHD.  - Fall in EF may be stress cardiomyopathy, but would recommend cath given known vascular disease to rule out significant CAD as cause of cardiomyopathy (aim for Tuesday). Discussed with patient and family today, they agree to procedure.  3. Acute respiratory failure:  Minimally responsive 12/29 and intubated for airway protection.  Extubated 12/30, stable today. Possible aspiration event. Chest x-ray with increasing LLL airspace disease.  - Vancomycin/Zosyn per CCM.  4. Acute encephalopathy: Head CT and MRI brain without acute process. Bilateral intracranial ICA stenosis (but no significant extracranial disease). EEG  with no seizure activity. Delirium seems to be improving.   5. ESRD: Followed by Dr. Hollie Salk as outpatient.  Volume status now optimized on CVVH.  - Can stop CVVH, transition back to iHD.   6. Pulmonary HTN: Mild PH on 4/21 RHC.  7. Hyperlipidemia: on atorvastatin 80.  8. DM II: A1c 9.1 - Per primary team 9. ID: Elevated WBCs, fever on 12/29.  ?Aspiration PNA.  - On empiric abx with vanc/zosyn  Out of bed, work with PT.    Loralie Champagne 02/22/2021 8:59 AM

## 2021-02-22 NOTE — Progress Notes (Signed)
[facility] Progress Note   Subjective:    I /O net neg 2.6 L yest and 1.5 last night, wt's down 84kg today. Today's CXR looks better to me, resolved IS pattern.    Objective Vitals:   02/22/21 1045 02/22/21 1100 02/22/21 1105 02/22/21 1115  BP: (!) 134/54 (!) 91/34 (!) 119/37 (!) 125/39  Pulse: 89 89 90 86  Resp: 20 (!) _0 Temp:      TempSrc:      SpO2: 95% 94% 95% 94%  Weight:      Height:       Physical Exam General: Older female; NAD Neck: no JVD Heart: Irregular; No murmurs, gallops, or rubs Lungs: Clear to bases, no rales Abdomen: Large, soft, non-tender Extremities: No edema UE or LE bilat Dialysis Access: LUA BCF +bruit  OP HD:  [place] [facility]     4h  350/500  87.2kg  2/2 bath  P2  Hep none   - hect 5 ug tiw   - venofer 50 q wk  - no esa     CXR 12/27 - IMPRESSION: Cardiomegaly with mild pulmonary interstitial edema and trace bilateral pleural effusions suggesting CHF.    CXR 12/29 - IMPRESSION: Cardiomegaly, pulmonary vascular congestion and increased hazy opacities since previous study which may represent increasing edema/infiltrate.  Assessment/Plan: AMS - delirium. Neuro consulting.  Acute resp insufficiency - extubated 12/30. +fever/ ^wbc/ pulm infiltrates. Started broad-spec IV abx w/ vanc/ zosyn. Extra volume removed w/ CRRT last 24hrs. CXR and exam improved, 3-4 kg under dry wt today. CVP low now, will dc CRRT.  Volume - w/ CHF/ IS edema by xrays, as above Hypotension - back on low dose pressors, wean off  Paroxysmal Afib - cardioverted in ED 12/27, in and out of afib now. HR's 80's. Not on any BB, getting po amio and IV heparin.  ESRD - on HD TTS.  Moved to ICU, CRRT started 12/30 and will dc today. Next HD planned for 02/24/21.  Anemia of CKD- Hgb 10.1-monitor for now. MBD ckd - Ca 8's, phos 5's. Cont vdra here. No binders on home med list Nutrition - Renal diet with fluid restriction  Kelly Splinter, MD 02/22/2021, 11:39  AM     Additional Objective Labs: Basic Metabolic Panel: CBC: Recent Labs  Lab 02/18/21 0516 02/19/21 0335 02/19/21 1052 02/19/21 1436 02/20/21 1009 02/21/21 0401 02/22/21 0830  WBC 11.5* 14.7* 13.0*  --  16.6* 16.4*  --   NEUTROABS  --  12.0* 11.6*  --   --   --   --   HGB 10.1* 11.0* 10.9*   < > 12.0 10.5* 11.6*  HCT 32.9* 36.3 33.6*   < > 36.3 32.7* 34.0*  MCV 100.3* 100.8* 96.6  --  94.8 95.3  --   PLT 288 338 271  --  294 288  --    < > = values in this interval not displayed.    Recent Labs  Lab 02/21/21 1608 02/21/21 1949 02/21/21 2346 02/22/21 0359 02/22/21 0734  GLUCAP 119* 128* 139* 121* 125*    Iron Studies: No results for input(s): IRON, TIBC, TRANSFERRIN, FERRITIN in the last 72 hours. Lab Results  Component Value Date   INR 1.1 11/17/2018   Medications:  sodium chloride Stopped (02/22/21 0739)   sodium chloride     albumin human 60 mL/hr at 02/22/21 1000   heparin 1,000 Units/hr (02/22/21 1000)   norepinephrine (LEVOPHED) Adult infusion 3 mcg/min (02/22/21 1000)  piperacillin-tazobactam Stopped (02/22/21 0559)  ° vancomycin Stopped (02/21/21 2052)  ° ° amiodarone  200 mg Oral BID  ° arformoterol  15 mcg Nebulization BID  ° atorvastatin  80 mg Oral QPM  ° budesonide (PULMICORT) nebulizer solution  0.5 mg Nebulization BID  ° chlorhexidine gluconate (MEDLINE KIT)  15 mL Mouth Rinse BID  ° Chlorhexidine Gluconate Cloth  6 each Topical Q0600  ° docusate  100 mg Oral BID  ° insulin aspart  0-9 Units Subcutaneous Q4H  ° latanoprost  1 drop Both Eyes QHS  ° lidocaine  1 patch Transdermal Q24H  ° mouth rinse  15 mL Mouth Rinse 10 times per day  ° multivitamin  1 tablet Oral QHS  ° pantoprazole (PROTONIX) IV  40 mg Intravenous Q24H  ° polyethylene glycol  17 g Oral Daily  ° QUEtiapine  25 mg Oral QHS  ° sodium chloride flush  3 mL Intravenous Q12H  ° °  °

## 2021-02-22 NOTE — Progress Notes (Signed)
[place] for apixaban>heparin infusion Indication: atrial fibrillation  Allergies  Allergen Reactions   Eggs Or Egg-Derived Products Nausea And Vomiting   Lisinopril Nausea And Vomiting   Penicillins Nausea And Vomiting    Has patient had a PCN reaction causing immediate rash, facial/tongue/throat swelling, SOB or lightheadedness with hypotension: No Has patient had a PCN reaction causing severe rash involving mucus membranes or skin necrosis: No Has patient had a PCN reaction that required hospitalization: No Has patient had a PCN reaction occurring within the last 10 years: No If all of the above answers are "NO", then may proceed with Cephalosporin use.    Other Other (See Comments)    Patient Measurements: Height: _0  (160 cm) Weight: 84.1 kg (185 lb 6.5 oz) IBW/kg (Calculated) : 52.4 Heparin Dosing Weight: 72.7 kg   Vital Signs: Temp: 98.3 F (36.8 C) (01/01 1152) Temp Source: Oral (01/01 1152) BP: 132/44 (01/01 1145) Pulse Rate: 87 (01/01 1145)  Labs: Recent Labs    02/20/21 0433 02/20/21 1009 02/20/21 1113 02/20/21 1524 02/20/21 1712 02/21/21 0401 02/21/21 1610 02/22/21 0351 02/22/21 0830  HGB  --  12.0  --   --   --  10.5*  --   --  11.6*  HCT  --  36.3  --   --   --  32.7*  --   --  34.0*  PLT  --  294  --   --   --  288  --   --   --   APTT 49*  --   --   --  61* 105*  --  84*  --   HEPARINUNFRC >1.10*  --   --   --   --  >1.10*  --  0.89*  --   CREATININE 6.63*  --   --    < >  --  2.94* 2.08* 1.71*  --   TROPONINIHS  --  385* 378*  --   --   --   --   --   --    < > = values in this interval not displayed.     Estimated Creatinine Clearance: 27.9 mL/min (A) (by C-G formula based on SCr of 1.71 mg/dL (H)).   Medical History: Past Medical History:  Diagnosis Date   Arthritis    Asthma    Atrial fibrillation (HCC)    CHF (congestive heart failure) (HCC)    CKD (chronic kidney disease), stage III (HCC)     COPD (chronic obstructive pulmonary disease) (HCC)    Diabetes mellitus    INSULIN DEPENDENT   Gout    Heart murmur    no issues per pt   History of kidney stones    Hyperlipemia    Hypertension    Psoriasis    Renal disorder    congenital   Single kidney    Sleep apnea    doesn't use the Cpap    Medications:  Scheduled:   amiodarone  200 mg Oral BID   arformoterol  15 mcg Nebulization BID   atorvastatin  80 mg Oral QPM   budesonide (PULMICORT) nebulizer solution  0.5 mg Nebulization BID   chlorhexidine gluconate (MEDLINE KIT)  15 mL Mouth Rinse BID   Chlorhexidine Gluconate Cloth  6 each Topical Q0600   docusate  100 mg Oral BID   [START ON 02/24/2021] doxercalciferol  5 mcg Intravenous Q T,Th,Sa-HD   insulin aspart  0-9 Units Subcutaneous Q4H  latanoprost  1 drop Both Eyes QHS   lidocaine  1 patch Transdermal Q24H   mouth rinse  15 mL Mouth Rinse 10 times per day   multivitamin  1 tablet Oral QHS   pantoprazole (PROTONIX) IV  40 mg Intravenous Q24H   polyethylene glycol  17 g Oral Daily   QUEtiapine  25 mg Oral QHS   sodium chloride flush  3 mL Intravenous Q12H    Assessment: 47 yof presenting with Afib - on apixaban PTA for hx Afib. Last dose on 12/28_0 .   Transferred from ICU due to unresponsiveness - now intubated. CT head showing no bleed but moderate chronic microvascular ischemia disease and severe L/moderate R intracranial ICA stenosis. Given recent DOAC, will need to monitor both aPTT and heparin level until correlate.   Aptt this AM within goal range at 84 seconds.  Heparin level still with a little influence of apixaban.  Hgb stable, no overt bleeding or complications noted.  Goal of Therapy:  Heparin level 0.3-0.7 units/ml aPTT 66-102 seconds Monitor platelets by anticoagulation protocol: Yes   Plan:  Continue IV heparin at 1000 units/hr. Recheck heparin level,  aPTT, and CBC with am labs   [place] Crane, Roylene Reason, [facility] Clinical  Pharmacist  02/22/2021 12:49 PM   [facility] pharmacy phone numbers are listed on [email or web address]

## 2021-02-22 NOTE — Progress Notes (Signed)
NAME:  Kenidee Cregan, MRN:  [id number], DOB:  [birth year], LOS: 3 ADMISSION DATE:  02/17/2021, CONSULTATION DATE:  02/19/2021 REFERRING MD:  Dr. Olevia Bowens, CHIEF COMPLAINT:  AMS   History of Present Illness:   81 year old female with prior history of COPD, mild pulmonary hypertension, ESRD TTS, PAF on Eliquis, HFpEF, IDDM DMT2, and obesity who presented on 1227 from dialysis with shortness of breath and palpitations.  She did not receive full dialysis treatment, last full treatment on 12/24 at her EDW.  Prior hospitalization 1 week ago at [facility] found to be in A. fib with RVR and placed on Eliquis at that time.  Plans for DCCV however she self converted.  No reports of prior fever, cough, nausea, or vomiting.    Found in ER afebrile, normotensive, and normal oxygenation but in A. fib with RVR in which cardiology was consulted and she was cardioverted back to normal sinus.  She was admitted to [facility], nephrology consulted and underwent iHD on 12/28.  She did have some brief hypotension at the end of iHD but recovered.  Last seen at baseline around 0300 on 12/29.  Around 0730, she was found to be minimally unresponsive with concern for airway protection.  Rapid response at bedside, glucose 206, and no new or sedating meds other than her home remeron last night.  She remains in NSR and normotensive with good O2 saturations.  She was placed on BIPAP, ABG showing 7.283/ 60/ 92/ 27.5.  PCCM consulted and patient transferred to ICU.    Of note, speaking with patient's two daughters, they report patient has been having episodes with difficulty swallowing/ choking episodes at least for two weeks and patient complained of hand tremors 12/28.   Pertinent  Medical History  COPD, former smoker (quit 15 yrs ago) mild pulmonary hypertension, ESRD, PAF on Eliquis, HFpEF, IDDM DMT2, obesity  Significant Hospital Events: Including procedures, antibiotic start and stop dates in addition to other pertinent  events   12/27 Admitted [facility], cards/ nephrology consulted, cardioverted,  12/28 iHD 12/29 unresponsive, LNW 0300, intubated for airway protection, CT, MRI., EEG unremarkable  12/30 extubate  Interim History / Subjective:  More somnolent today. Slept about 3 hours. Daughter at bedside. Escalating pressor needs.  Objective   Blood pressure (!) 114/49, pulse 68, temperature 97.8 F (36.6 C), temperature source Oral, resp. rate 18, height 5\' 3"  (1.6 m), weight 84.1 kg, SpO2 96 %. CVP:  [2 mmHg-40 mmHg] 2 mmHg      Intake/Output Summary (Last 24 hours) at 02/22/2021 0827 Last data filed at 02/22/2021 0800 Gross per 24 hour  Intake 799.89 ml  Output 3745 ml  Net -2945.11 ml    Filed Weights   02/20/21 0500 02/21/21 0500 02/22/21 0545  Weight: 89.1 kg 88.6 kg 84.1 kg   Examination: Sleepy but opens eyes to voice Ext warm Heart in sinus but having some pauses Escalating pressor needs, CVP is 2  BMP/CBC reviewed, notable for persistent leukocytosis  Resolved Hospital Problem list    Assessment & Plan:   Coma of unclear etiology question exaggerated CO2 narcosis- improved.  MRI/CT/EEG benign. Respiratory failure due to inability to protect airway improved now off vent. ESRD on HD with marginal pressures now on CRRT Shock, escalating pressor needs- think she is dry now Cardiomyopathy, new ?stress, CHF team following Afib s/p DCCV, in sinus ICU delirium- expected given events above LLL Pna- question aspiration during unresponsive event above Dysphagia- see SLP note 12/31, appreciate help  - Amio/heparin per  CHF team - Stop CRRT today and see how BPs settle out - Encourage day/night cycles, continue seroquel qHS - Broad spectrum abx, f/u culture data - modified diet per SLP - Titrate levophed to MAP 65, will give some 25% albumin  Best Practice (right click and "Reselect all SmartList Selections" daily)   Diet/type: modified DVT prophylaxis: hep gtt  GI prophylaxis:  PPI Lines: Dialysis Catheter and Arterial Line Foley:  N/A Code Status:  full code Last date of multidisciplinary goals of care discussion: 1/1 daughters at bedside   39 minutes cc time Erskine Emery MD PCCM

## 2021-02-23 DIAGNOSIS — I480 Paroxysmal atrial fibrillation: Secondary | ICD-10-CM | POA: Diagnosis not present

## 2021-02-23 DIAGNOSIS — I5021 Acute systolic (congestive) heart failure: Secondary | ICD-10-CM

## 2021-02-23 LAB — CBC
HCT: 34.3 % — ABNORMAL LOW (ref 36.0–46.0)
Hemoglobin: 10.7 g/dL — ABNORMAL LOW (ref 12.0–15.0)
MCH: 31.1 pg (ref 26.0–34.0)
MCHC: 31.2 g/dL (ref 30.0–36.0)
MCV: 99.7 fL (ref 80.0–100.0)
Platelets: 314 10*3/uL (ref 150–400)
RBC: 3.44 MIL/uL — ABNORMAL LOW (ref 3.87–5.11)
RDW: 14.2 % (ref 11.5–15.5)
WBC: 15.5 10*3/uL — ABNORMAL HIGH (ref 4.0–10.5)
nRBC: 0.8 % — ABNORMAL HIGH (ref 0.0–0.2)

## 2021-02-23 LAB — GLUCOSE, CAPILLARY
Glucose-Capillary: 208 mg/dL — ABNORMAL HIGH (ref 70–99)
Glucose-Capillary: 184 mg/dL — ABNORMAL HIGH (ref 70–99)
Glucose-Capillary: 273 mg/dL — ABNORMAL HIGH (ref 70–99)
Glucose-Capillary: 272 mg/dL — ABNORMAL HIGH (ref 70–99)
Glucose-Capillary: 265 mg/dL — ABNORMAL HIGH (ref 70–99)
Glucose-Capillary: 174 mg/dL — ABNORMAL HIGH (ref 70–99)

## 2021-02-23 LAB — HEPARIN LEVEL (UNFRACTIONATED): Heparin Unfractionated: 0.41 IU/mL (ref 0.30–0.70)

## 2021-02-23 LAB — BASIC METABOLIC PANEL
Anion gap: 12 (ref 5–15)
BUN: 13 mg/dL (ref 8–23)
CO2: 21 mmol/L — ABNORMAL LOW (ref 22–32)
Calcium: 9.1 mg/dL (ref 8.9–10.3)
Chloride: 103 mmol/L (ref 98–111)
Creatinine, Ser: 3.22 mg/dL — ABNORMAL HIGH (ref 0.44–1.00)
GFR, Estimated: 14 mL/min — ABNORMAL LOW (ref >60)
Glucose, Bld: 212 mg/dL — ABNORMAL HIGH (ref 70–99)
Potassium: 3.7 mmol/L (ref 3.5–5.1)
Sodium: 136 mmol/L (ref 135–145)

## 2021-02-23 LAB — APTT: aPTT: 77 seconds — ABNORMAL HIGH (ref 24–36)

## 2021-02-23 LAB — MAGNESIUM: Magnesium: 2.7 mg/dL — ABNORMAL HIGH (ref 1.7–2.4)

## 2021-02-23 LAB — PHOSPHORUS: Phosphorus: 2.3 mg/dL — ABNORMAL LOW (ref 2.5–4.6)

## 2021-02-23 MED ORDER — ASPIRIN 81 MG PO CHEW
81.0000 mg | CHEWABLE_TABLET | ORAL | Status: AC
  Administered 2021-02-24: 81 mg via ORAL
  Filled 2021-02-23: qty 1

## 2021-02-23 MED ORDER — SODIUM CHLORIDE 0.9% FLUSH: 3.0000 mL | INTRAVENOUS | Status: DC | PRN

## 2021-02-23 MED ORDER — NOREPINEPHRINE 16 MG/250ML-% IV SOLN
0.0000 ug/min | INTRAVENOUS | Status: DC
  Administered 2021-02-23: 22:00:00 5 ug/min via INTRAVENOUS
  Filled 2021-02-23: qty 250

## 2021-02-23 MED ORDER — CHLORHEXIDINE GLUCONATE CLOTH 2 % EX PADS
6.0000 | MEDICATED_PAD | Freq: Every day | CUTANEOUS | Status: DC
  Administered 2021-02-24: 6 via TOPICAL

## 2021-02-23 MED ORDER — SODIUM CHLORIDE 0.9 % IV SOLN: 250.0000 mL | INTRAVENOUS | Status: DC | PRN

## 2021-02-23 MED ORDER — MIDODRINE HCL 5 MG PO TABS
5.0000 mg | ORAL_TABLET | Freq: Three times a day (TID) | ORAL | Status: DC
  Administered 2021-02-23 – 2021-02-24 (×6): 5 mg via ORAL
  Filled 2021-02-23 (×6): qty 1

## 2021-02-23 MED ORDER — AMIODARONE HCL 200 MG PO TABS
200.0000 mg | ORAL_TABLET | Freq: Every day | ORAL | Status: DC
  Administered 2021-02-23: 200 mg via ORAL
  Filled 2021-02-23: qty 1

## 2021-02-23 MED ORDER — SODIUM CHLORIDE 0.9% FLUSH
3.0000 mL | Freq: Two times a day (BID) | INTRAVENOUS | Status: DC
  Administered 2021-02-23 – 2021-03-05 (×17): 3 mL via INTRAVENOUS

## 2021-02-23 MED ORDER — SODIUM CHLORIDE 0.9 % IV SOLN: INTRAVENOUS | Status: DC

## 2021-02-23 MED ORDER — NEPRO/CARBSTEADY PO LIQD
237.0000 mL | Freq: Three times a day (TID) | ORAL | Status: DC
  Administered 2021-02-23 – 2021-03-05 (×22): 237 mL via ORAL

## 2021-02-23 NOTE — Progress Notes (Signed)
[place] for apixaban>heparin infusion Indication: atrial fibrillation  Allergies  Allergen Reactions   Eggs Or Egg-Derived Products Nausea And Vomiting   Lisinopril Nausea And Vomiting   Penicillins Nausea And Vomiting    Has patient had a PCN reaction causing immediate rash, facial/tongue/throat swelling, SOB or lightheadedness with hypotension: No Has patient had a PCN reaction causing severe rash involving mucus membranes or skin necrosis: No Has patient had a PCN reaction that required hospitalization: No Has patient had a PCN reaction occurring within the last 10 years: No If all of the above answers are "NO", then may proceed with Cephalosporin use.    Other Other (See Comments)    Patient Measurements: Height: 5\' 3"  (160 cm) Weight: 81.6 kg (179 lb 14.3 oz) IBW/kg (Calculated) : 52.4 Heparin Dosing Weight: 72.7 kg   Vital Signs: Temp: 97.8 F (36.6 C) (01/02 0741) Temp Source: Oral (01/02 0741) BP: 132/49 (01/02 0700) Pulse Rate: 47 (01/02 0700)  Labs: Recent Labs    02/20/21 1009 02/20/21 1113 02/20/21 1524 02/20/21 1712 02/21/21 0401 02/21/21 1610 02/22/21 0351 02/22/21 0830 02/23/21 0339  HGB 12.0  --   --   --  10.5*  --   --  11.6* 10.7*  HCT 36.3  --   --   --  32.7*  --   --  34.0* 34.3*  PLT 294  --   --   --  288  --   --   --  314  APTT  --   --   --  61* 105*  --  84*  --   --   HEPARINUNFRC  --   --   --   --  >1.10*  --  0.89*  --  0.41  CREATININE  --   --    < >  --  2.94* 2.08* 1.71*  --  3.22*  TROPONINIHS 385* 378*  --   --   --   --   --   --   --    < > = values in this interval not displayed.     Estimated Creatinine Clearance: 14.6 mL/min (A) (by C-G formula based on SCr of 3.22 mg/dL (H)).   Medical History: Past Medical History:  Diagnosis Date   Arthritis    Asthma    Atrial fibrillation (HCC)    CHF (congestive heart failure) (HCC)    CKD (chronic kidney disease), stage III (HCC)     COPD (chronic obstructive pulmonary disease) (HCC)    Diabetes mellitus    INSULIN DEPENDENT   Gout    Heart murmur    no issues per pt   History of kidney stones    Hyperlipemia    Hypertension    Psoriasis    Renal disorder    congenital   Single kidney    Sleep apnea    doesn't use the Cpap    Medications:  Scheduled:   amiodarone  200 mg Oral BID   arformoterol  15 mcg Nebulization BID   atorvastatin  80 mg Oral QPM   budesonide (PULMICORT) nebulizer solution  0.5 mg Nebulization BID   Chlorhexidine Gluconate Cloth  6 each Topical Q0600   docusate  100 mg Oral BID   [START ON 02/24/2021] doxercalciferol  5 mcg Intravenous Q T,Th,Sa-HD   insulin aspart  0-9 Units Subcutaneous Q4H   latanoprost  1 drop Both Eyes QHS   lidocaine  1 patch Transdermal Q24H  multivitamin  1 tablet Oral QHS   pantoprazole (PROTONIX) IV  40 mg Intravenous Q24H   polyethylene glycol  17 g Oral Daily   QUEtiapine  25 mg Oral QHS   sodium chloride flush  3 mL Intravenous Q12H   vancomycin variable dose per unstable renal function (pharmacist dosing)   Does not apply See admin instructions    Assessment: 60 yof presenting with Afib RVR - pt on apixaban PTA for hx Afib. Last dose on 12/28@2245 . Anticoagulation transitioned to heparin in anticipation of procedures.   APTT and heparin level both therapeutic this am, appear correlating so will stop aPTT checks, CBC stable.  Goal of Therapy:  Heparin level 0.3-0.7 units/ml aPTT 66-102 seconds Monitor platelets by anticoagulation protocol: Yes   Plan:  Continue IV heparin at 1000 units/hr. Daily heparin level and CBC  Arrie Senate, PharmD, [place], [facility] Clinical Pharmacist [phone] Please check AMION for all [facility] Pharmacy numbers 02/23/2021

## 2021-02-23 NOTE — Progress Notes (Signed)
Nutrition Follow-up  DOCUMENTATION CODES:   Non-severe (moderate) malnutrition in context of chronic illness  INTERVENTION:   Nepro Shake po TID, each supplement provides 425 kcal and 19 grams protein  Continue Renal MVI   NUTRITION DIAGNOSIS:   Moderate Malnutrition related to chronic illness (ESRD) as evidenced by mild fat depletion, mild muscle depletion.  Being addressed as diet advanced, supplements  GOAL:   Patient will meet greater than or equal to 90% of their needs  Progressing  MONITOR:   TF tolerance, Labs, Weight trends, Vent status  REASON FOR ASSESSMENT:   Ventilator    ASSESSMENT:   81 yo female admitted on 12/27 with afib with RVR, acute on chronic CHF/pulmonary edema. Pt found unresponsive on 12/29 requiring intubation. PMH includes ESRD on HD, CHF, DM, COPD, HLD  12/27 Admitted 12/28 iHD 12/29 Intubated; OGT placed (gastric tip confirmed via xray) 12/30 Extubated; OGT removed , CRRT initiated 12/31 Dysphagia 1/Thins 01/01 CRRT discontinued 01/02 Dysphagia 3/Thins  Order for Cortrak this AM deferred and mental status improving. Diet advanced to Dysphagia 3/Thins. Pt sitting up in chair, eating lunch. Pt also drinking Nepro shakes.  Recorded po intake 0-100% of meals  EDW 87.2 kg; current wt 81.6 kg  Labs:  Creatinine 3.22 Meds: colace, hectoral, ss novolog, Rena-vite, iralax, senna-docusate  Diet Order:   Diet Order             Diet NPO time specified Except for: Sips with Meds  Diet effective midnight           DIET DYS 3 Room service appropriate? Yes; Fluid consistency: Thin  Diet effective now                   EDUCATION NEEDS:   Not appropriate for education at this time  Skin:  Skin Assessment: Reviewed RN Assessment  Last BM:  1/02  Height:   Ht Readings from Last 1 Encounters:  02/19/21 5\' 3"  (1.6 m)    Weight:   Wt Readings from Last 1 Encounters:  02/23/21 81.6 kg    BMI:  Body mass index is 31.87  kg/m.  Estimated Nutritional Needs:   Kcal:  1600-1800 kcals  Protein:  105-130 g  Fluid:  1000 mL plus UOP   Kerman Passey MS, RDN, LDN, CNSC Registered Dietitian III Clinical Nutrition RD Pager and On-Call Pager Number Located in [place]

## 2021-02-23 NOTE — Progress Notes (Addendum)
Patient ID: Stephanie Woodward, female   DOB: 11-19-1940, 81 y.o.   MRN: [id number]     Advanced Heart Failure Rounding Note  PCP-Cardiologist: Ezzard Standing, MD   Subjective:    12/29: Found unresponsive, no arrhythmias noted. Intubated. Started on NE.  MRI brain without acute process. 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  Had fever to 101F. Started on broad spectrum abx and pan cultures sent.  12/30: CVVH started. Extubated.  Echo reviewed: EF 30-35% range, worse function in septum; mild RV dysfunction, IVC dilated.   She is afebrile today on vancomycin/Zosyn.   CVVH stopped yesterday. CVP < 5   NE added back overnight for hypotension, currently on 7 mcg.   Sinus brady on tele, HR upper 40s-low 50s  She is A&Ox3. Looks and feels fatigued. Denies dyspnea. No CP.     Objective:   Weight Range: 81.6 kg Body mass index is 31.87 kg/m.   Vital Signs:   Temp:  [97.8 F (36.6 C)-98.7 F (37.1 C)] 97.8 F (36.6 C) (01/02 0741) Pulse Rate:  [47-92] 47 (01/02 0700) Resp:  [13-33] 18 (01/02 0700) BP: (91-154)/(31-69) 132/49 (01/02 0700) SpO2:  [91 %-100 %] 100 % (01/02 0731) Arterial Line BP: (105-133)/(25-40) 107/30 (01/02 0700) Weight:  [81.6 kg] 81.6 kg (01/02 0600) Last BM Date: 02/22/21  Weight change: Filed Weights   02/21/21 0500 02/22/21 0545 02/23/21 0600  Weight: 88.6 kg 84.1 kg 81.6 kg    Intake/Output:   Intake/Output Summary (Last 24 hours) at 02/23/2021 0746 Last data filed at 02/23/2021 0300 Gross per 24 hour  Intake 651.96 ml  Output 386 ml  Net 265.96 ml      Physical Exam    CVP < 5  General:  fatigued appearing. No respiratory difficulty HEENT: normal Neck: supple. no JVD. + Rt IJ CVC Carotids 2+ bilat; no bruits. No lymphadenopathy or thyromegaly appreciated. Cor: PMI nondisplaced. Regular rhythm and slow rate. No rubs, gallops or murmurs. Lungs: clear Abdomen: soft,  nontender, nondistended. No hepatosplenomegaly. No bruits or masses. Good bowel sounds. Extremities: no cyanosis, clubbing, rash, edema Neuro: alert & oriented x 3, cranial nerves grossly intact. moves all 4 extremities w/o difficulty. Affect pleasant.   Telemetry   Sinus brady, upper 40s-low 50s, personally reviewed.   Labs    CBC Recent Labs    02/21/21 0401 02/22/21 0830 02/23/21 0339  WBC 16.4*  --  15.5*  HGB 10.5* 11.6* 10.7*  HCT 32.7* 34.0* 34.3*  MCV 95.3  --  99.7  PLT 288  --  259   Basic Metabolic Panel Recent Labs    02/22/21 0351 02/22/21 0830 02/23/21 0339  NA 135 136 136  K 4.0 4.1 3.7  CL 100  --  103  CO2 22  --  21*  GLUCOSE 132*  --  212*  BUN 7*  --  13  CREATININE 1.71*  --  3.22*  CALCIUM 8.6*  --  9.1  MG 2.6*  --  2.7*  PHOS 2.0*  --  2.3*   Liver Function Tests Recent Labs    02/21/21 1610 02/22/21 0351  ALBUMIN 2.4* 2.5*   No results for input(s): LIPASE, AMYLASE in the last 72 hours. Cardiac Enzymes No results for input(s): CKTOTAL, CKMB, CKMBINDEX, TROPONINI in the last 72 hours.  BNP: BNP (last 3 results) Recent Labs    06/08/20 1011  BNP 402.0*    ProBNP (last 3 results) No results for  input(s): PROBNP in the last 8760 hours.   D-Dimer No results for input(s): DDIMER in the last 72 hours. Hemoglobin A1C No results for input(s): HGBA1C in the last 72 hours.  Fasting Lipid Panel No results for input(s): CHOL, HDL, LDLCALC, TRIG, CHOLHDL, LDLDIRECT in the last 72 hours.  Thyroid Function Tests No results for input(s): TSH, T4TOTAL, T3FREE, THYROIDAB in the last 72 hours.  Invalid input(s): FREET3   Other results:   Imaging    No results found.   Medications:     Scheduled Medications:  amiodarone  200 mg Oral BID   arformoterol  15 mcg Nebulization BID   atorvastatin  80 mg Oral QPM   budesonide (PULMICORT) nebulizer solution  0.5 mg Nebulization BID   Chlorhexidine Gluconate Cloth  6 each  Topical Q0600   docusate  100 mg Oral BID   [START ON 02/24/2021] doxercalciferol  5 mcg Intravenous Q T,Th,Sa-HD   insulin aspart  0-9 Units Subcutaneous Q4H   latanoprost  1 drop Both Eyes QHS   lidocaine  1 patch Transdermal Q24H   multivitamin  1 tablet Oral QHS   pantoprazole (PROTONIX) IV  40 mg Intravenous Q24H   polyethylene glycol  17 g Oral Daily   QUEtiapine  25 mg Oral QHS   sodium chloride flush  3 mL Intravenous Q12H   vancomycin variable dose per unstable renal function (pharmacist dosing)   Does not apply See admin instructions    Infusions:  sodium chloride Stopped (02/22/21 0739)   sodium chloride     heparin 1,000 Units/hr (02/23/21 0300)   norepinephrine (LEVOPHED) Adult infusion 7 mcg/min (02/23/21 0300)   piperacillin-tazobactam (ZOSYN)  IV 2.25 g (02/23/21 0609)    PRN Medications: sodium chloride, acetaminophen, hydrALAZINE, ipratropium-albuterol, ondansetron **OR** ondansetron (ZOFRAN) IV, senna-docusate   Assessment/Plan   1.  Atrial fibrillation: Known hx paroxysmal AF.  Seen in ED at [facility] the week PTA for AF and spontaneously converted to SR.  Presented 02/17/21 with AF with RVR. Cardioverted in ER.  She was in junctional rhythm after respiratory arrest and intubation with rate upper 40s-50s, now back in SR, bradycardic in the low 50s but asymptomatic  - Continue amiodarone 200 mg bid. - Continue heparin gtt.  2. Acute systolic CHF: Echo in 4/01 with EF 50-55%, mild LVH, grade 2 diastolic dysfunction.  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  [place] 11/23/17 showed significantly elevated right and left heart filling pressures and severe primarily pulmonary venous hypertension. Cardiac output preserved. Echo (10/19) with EF 45-50%, septal-lateral dyssynchrony, RV looked ok.  PYP scan was not suggestive of transthyretin amyloidosis.  Echo in 1/21 with EF 55-60%, normal RV.  RHC/LHC in 4/21  with no significant CAD, normal filling pressures and cardiac output.  Echo this admission shows EF down to 35%.  HS-TnI 385 => 378, likely demand ischemia with hypotension/respiratory arrest.  Back on NE this morning at 7 mcg. CVVH stopped 1/1. Next iHD tomorrow.   - Add midodrine 5 tid to help facilitate NE wean  - Fall in EF may be stress cardiomyopathy, but would recommend cath given known vascular disease to rule out significant CAD as cause of cardiomyopathy (aim for Tuesday). Discussed with patient and family, they agree to procedure.  3. Acute respiratory failure:  Minimally responsive 12/29 and intubated for airway protection.  Extubated 12/30, stable today. Possible aspiration event. Chest x-ray with increasing LLL airspace disease.  - Vancomycin/Zosyn per CCM.  4. Acute encephalopathy: Head CT and MRI brain without acute process. Bilateral intracranial ICA stenosis (but no significant extracranial disease). EEG with no seizure activity. Delirium seems to be improving.   5. ESRD: Followed by Dr. Hollie Salk as outpatient.  Volume status optimized on CVVH (stopped 1/1).  - transition back to iHD. Next session planned tomorrow per renal  6. Pulmonary HTN: Mild PH on 4/21 RHC.  7. Hyperlipidemia: on atorvastatin 80.  8. DM II: A1c 9.1 - Per primary team 9. ID: Elevated WBCs, fever on 12/29.  ?Aspiration PNA.  - On empiric abx with vanc/zosyn  Lyda Jester, PA-C  02/23/2021 7:46 AM  Patient seen with PA, agree with the above note.   Now off CVVH with CVP < 5.  She is currently on norepinephrine 4, titrating down.  Diastolic BP has been running low.  HR 40s-50s, NSR.   Not eating well, on pureed diet.   Feels weak.   General: NAD Neck: No JVD, no thyromegaly or thyroid nodule.  Lungs: Dependent crackles.  CV: Nondisplaced PMI.  Heart regular S1/S2, no S3/S4, no murmur.  No peripheral edema.   Abdomen: Soft, nontender, no hepatosplenomegaly, no distention.  Skin: Intact without  lesions or rashes.  Neurologic: Alert and oriented x 3.  Psych: Normal affect. Extremities: No clubbing or cyanosis.  HEENT: Normal.   Continue abx for LLL PNA, ?aspiration event.   Will ask speech/swallow to re-evaluate diet, does not like the pureed foot and not eating much.   Decrease amiodarone to 200 mg daily with bradycardia/HR around 50 bpm.   Add midodrine 5 mg tid and ok to wean NE to keep MAP > 60 (has low diastolic pressure).   As above, plan coronary angiography tomorrow with drop in EF.   CRITICAL CARE Performed by: Loralie Champagne  Total critical care time: 35 minutes  Critical care time was exclusive of separately billable procedures and treating other patients.  Critical care was necessary to treat or prevent imminent or life-threatening deterioration.  Critical care was time spent personally by me on the following activities: development of treatment plan with patient and/or surrogate as well as nursing, discussions with consultants, evaluation of patient's response to treatment, examination of patient, obtaining history from patient or surrogate, ordering and performing treatments and interventions, ordering and review of laboratory studies, ordering and review of radiographic studies, pulse oximetry and re-evaluation of patient's condition.  Loralie Champagne 02/23/2021 8:52 AM

## 2021-02-23 NOTE — Progress Notes (Signed)
Inpatient Diabetes Program Recommendations  AACE/ADA: New Consensus Statement on Inpatient Glycemic Control (2015)  Target Ranges:  Prepandial:   less than 140 mg/dL      Peak postprandial:   less than 180 mg/dL (1-2 hours)      Critically ill patients:  140 - 180 mg/dL   Lab Results  Component Value Date   GLUCAP 184 (H) 02/23/2021   HGBA1C 9.1 (H) 02/18/2021    Review of Glycemic Control  Diabetes history: DM2 Outpatient Diabetes medications: Tresiba 18 units QD, Novolog 12/02/10 TID Current orders for Inpatient glycemic control: Novolog 0-9 units Q4H  HgbA1C 9.1% CBGs 208, 184 mg/dL  Inpatient Diabetes Program Recommendations:    Consider Semglee 5 units QD Change Novolog 0-9 units Q4H to TID with meals if po intake improves.   Will continue to follow glucose trends.  Thank you. Lorenda Peck, RD, LDN, CDE Inpatient Diabetes Coordinator [phone]

## 2021-02-23 NOTE — Progress Notes (Signed)
Speech Language Pathology Treatment: Dysphagia  Patient Details Name: Stephanie Woodward MRN: [id number] DOB: 06-30-1940 Today's Date: 02/23/2021 Time: 0917-0950 SLP Time Calculation (min) (ACUTE ONLY): 33 min  Assessment / Plan / Recommendation Clinical Impression  Pt's mentation is improved this morning certainly since initial evaluation with this SLP, but suspect since completion of FEES this weekend as well. She and her daughter confirm that she does not eat with her dentures at home, and has not for many years. Pt consumed advanced trials of regular solids, intermixed with sips of thin liquids. Pt did have some delayed coughing that began at the end of intake and was more noticeable after intake had stopped, although still intermittent. Pt describes feeling like things would get stuck PTA, and even describes feeling like liquids would go down and then come back up at times. Particularly considering that pharyngeal function was WNL during FEES, question if this could represent a primary esophageal component. Education was provided to pt and both of her daughters (one joining via telephone) about general aspiration and esophageal precautions to try to reduce symptoms as much as possible. I think she is appropriate for diet advancement, and after discussing with them, they would like to begin with an upgrade to Dys 3 (mechanical soft) diet and thin liquids. SLP will plan to f/u for ongoing differential dx, potential advancement, and ongoing education/training. However, MD may also want to consider a more dedicated esophageal w/u given presence of symptoms PTA.    HPI HPI: Pt adm 12/27 with SOB and found to be in afib with RVR. On 12/28 pt found unresponsive and transferred to ICU and intubated.  Pt placed on CRRT on 12/29. Head CT and MRI negative. Extubated 12/30. PMH - ESRD on HD, afib, chf, DM, copd, gout. Daughters report some coughing with meals PTA>      SLP Plan  Continue with current plan of  care      Recommendations for follow up therapy are one component of a multi-disciplinary discharge planning process, led by the attending physician.  Recommendations may be updated based on patient status, additional functional criteria and insurance authorization.    Recommendations  Diet recommendations: Dysphagia 3 (mechanical soft);Thin liquid Liquids provided via: Cup;Straw Medication Administration: Whole meds with puree Supervision: Staff to assist with self feeding Compensations: Minimize environmental distractions;Slow rate;Small sips/bites;Follow solids with liquid Postural Changes and/or Swallow Maneuvers: Seated upright 90 degrees;Upright 30-60 min after meal                Oral Care Recommendations: Oral care BID Follow Up Recommendations:  (tba) Assistance recommended at discharge: Intermittent Supervision/Assistance SLP Visit Diagnosis: Dysphagia, unspecified (R13.10) Plan: Continue with current plan of care           Osie Bond., M.A. [facility] Pager [phone] Office [phone]  02/23/2021, 9:54 AM

## 2021-02-23 NOTE — NC FL2 (Signed)
[place] MEDICAID FL2 LEVEL OF CARE SCREENING TOOL     IDENTIFICATION  Patient Name: Stephanie Woodward Birthdate: 10-23-1940 Sex: female Admission Date (Current Location): 02/17/2021  [facility] and [place] Number:  Herbalist and Address:  [facility], [place] [address] [place], [place] [address]      Provider Number: [id number]  Attending Physician Name and Address:  Candee Furbish, MD  Relative Name and Phone Number:  Jobe Igo # [phone]    Current Level of Care: Hospital Recommended Level of Care: [place] Prior Approval Number:    Date Approved/Denied:   PASRR Number: [id number]  Discharge Plan: SNF    Current Diagnoses: Patient Active Problem List   Diagnosis Date Noted   Malnutrition of moderate degree 02/21/2021   Acute respiratory failure with hypoxia ([place]) 02/19/2021   Acute on chronic diastolic CHF (congestive heart failure) ([place]) 02/17/2021   ESRD on hemodialysis ([place]) 02/17/2021   Nausea without vomiting 05/16/2020   Paroxysmal atrial fibrillation ([place]) 10/31/2019   Secondary hypercoagulable state ([place]) 10/31/2019   Type 2 diabetes mellitus with hyperglycemia, with long-term current use of insulin ([place]) 02/02/2019   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 ([place]) 02/02/2019   Type 2 diabetes mellitus with stage 4 chronic kidney disease, with long-term current use of insulin ([place]) 02/02/2019   Acute renal failure superimposed on chronic kidney disease ([place]) 01/24/2019   Leukocytosis 01/24/2019   Hypokalemia 01/24/2019   Atrial fibrillation, chronic (HCC) 01/24/2019   Chronic anticoagulation 01/24/2019   Chronic kidney disease (CKD), stage IV (severe) (HCC) 10/31/2018   Paroxysmal atrial fibrillation with RVR ([place]) 04/08/2016   Chest pain 04/04/2016   Acute on chronic renal failure ([place]) 03/09/2016   Diabetes mellitus with complication ([place]) 63/02/6008   Foot pain,  bilateral 03/09/2016   Renal disorder    Bradycardia    CHF (congestive heart failure) ([place]) 02/06/2014   Cellulitis of right lower extremity 02/06/2014   Essential hypertension 02/06/2014   Hyperlipidemia 02/06/2014   Uncontrolled type 2 diabetes mellitus with hypoglycemia ([place]) 02/06/2014   Cellulitis 02/06/2014   Hypoxemia 12/07/2013   Type 2 diabetes mellitus, controlled, with renal complications ([place]) 93/23/5573   Kidney disease, chronic, stage III (GFR 30-59 ml/min) (HCC) 12/07/2013   Pulmonary hypertension ([place]) 22/03/5425   Chronic systolic congestive heart failure (HCC)    SOB (shortness of breath) 12/05/2013   COPD (chronic obstructive pulmonary disease) ([place])    Gout 11/11/2009   Obesity 11/11/2009   Hypertensive heart disease     Orientation RESPIRATION BLADDER Height & Weight     Self, Time, Situation, Place  Normal Continent Weight: 81.6 kg Height:  5\' 3"  (160 cm)  BEHAVIORAL SYMPTOMS/MOOD NEUROLOGICAL BOWEL NUTRITION STATUS      Continent Diet (Carb Modified, 2 g Sodium)  AMBULATORY STATUS COMMUNICATION OF NEEDS Skin   Extensive Assist Verbally Normal                       Personal Care Assistance Level of Assistance  Bathing, Feeding, Dressing Bathing Assistance: Maximum assistance Feeding assistance: Limited assistance Dressing Assistance: Maximum assistance     Functional Limitations Info  Sight, Hearing, Speech Sight Info: Adequate Hearing Info: Adequate Speech Info: Adequate    SPECIAL CARE FACTORS FREQUENCY  PT (By licensed PT), OT (By licensed OT)     PT Frequency: 5x per week OT Frequency: 5x per week  Contractures Contractures Info: Not present    Additional Factors Info  Code Status, Allergies (Hemodialysis Tues, Thru, Sat ([facility] transportation)) Code Status Info: Full Code Allergies Info: Eggs, Lisinopril, Penicillins           Current Medications (02/23/2021):  This is the current hospital active medication  list Current Facility-Administered Medications  Medication Dose Route Frequency Provider Last Rate Last Admin   0.9 %  sodium chloride infusion   Intravenous PRN Candee Furbish, MD   Stopped at 02/22/21 0739   0.9 %  sodium chloride infusion  250 mL Intravenous Continuous Candee Furbish, MD       0.9 %  sodium chloride infusion  250 mL Intravenous PRN Lyda Jester M, PA-C       [START ON 02/24/2021] 0.9 %  sodium chloride infusion   Intravenous Continuous Simmons, Brittainy M, PA-C       acetaminophen (TYLENOL) tablet 650 mg  650 mg Oral Q6H PRN Candee Furbish, MD       amiodarone (PACERONE) tablet 200 mg  200 mg Oral Daily Larey Dresser, MD   200 mg at 02/23/21 1005   arformoterol (BROVANA) nebulizer solution 15 mcg  15 mcg Nebulization BID Jennelle Human B, NP   15 mcg at 02/23/21 0731   [START ON 02/24/2021] aspirin chewable tablet 81 mg  81 mg Oral Pre-Cath Simmons, Brittainy M, PA-C       atorvastatin (LIPITOR) tablet 80 mg  80 mg Oral QPM Candee Furbish, MD   80 mg at 02/21/21 1711   budesonide (PULMICORT) nebulizer solution 0.5 mg  0.5 mg Nebulization BID Jennelle Human B, NP   0.5 mg at 02/23/21 0731   Chlorhexidine Gluconate Cloth 2 % PADS 6 each  6 each Topical Q0600 Charlynne Cousins, MD   6 each at 02/23/21 1100   docusate (COLACE) 50 MG/5ML liquid 100 mg  100 mg Oral BID Candee Furbish, MD       [START ON 02/24/2021] doxercalciferol (HECTOROL) injection 5 mcg  5 mcg Intravenous Q T,Th,Sa-HD Roney Jaffe, MD       feeding supplement (NEPRO CARB STEADY) liquid 237 mL  237 mL Oral TID WC Candee Furbish, MD   237 mL at 02/23/21 1153   heparin ADULT infusion 100 units/mL (25000 units/238mL)  1,000 Units/hr Intravenous Continuous Carney, Gay Filler, RPH 10 mL/hr at 02/23/21 1300 1,000 Units/hr at 02/23/21 1300   hydrALAZINE (APRESOLINE) injection 10 mg  10 mg Intravenous Q4H PRN Joette Catching, PA-C       insulin aspart (novoLOG) injection 0-9 Units  0-9 Units  Subcutaneous Q4H Jennelle Human B, NP   5 Units at 02/23/21 1156   ipratropium-albuterol (DUONEB) 0.5-2.5 (3) MG/3ML nebulizer solution 3 mL  3 mL Nebulization Q4H PRN Jennelle Human B, NP       latanoprost (XALATAN) 0.005 % ophthalmic solution 1 drop  1 drop Both Eyes QHS Charlynne Cousins, MD   1 drop at 02/21/21 2116   lidocaine (LIDODERM) 5 % 1 patch  1 patch Transdermal Q24H Candee Furbish, MD   1 patch at 02/22/21 1648   midodrine (PROAMATINE) tablet 5 mg  5 mg Oral TID WC Lyda Jester M, PA-C   5 mg at 02/23/21 1152   multivitamin (RENA-VIT) tablet 1 tablet  1 tablet Oral QHS Candee Furbish, MD   1 tablet at 02/22/21 2222   norepinephrine (LEVOPHED) 4mg  in 279mL (0.016 mg/mL) premix infusion  0-40  mcg/min Intravenous Titrated Candee Furbish, MD 11.25 mL/hr at 02/23/21 1300 3 mcg/min at 02/23/21 1300   ondansetron (ZOFRAN) tablet 4 mg  4 mg Oral Q6H PRN Candee Furbish, MD       Or   ondansetron [facility]) injection 4 mg  4 mg Intravenous Q6H PRN Candee Furbish, MD       pantoprazole (PROTONIX) injection 40 mg  40 mg Intravenous Q24H Jennelle Human B, NP   40 mg at 02/23/21 1007   piperacillin-tazobactam (ZOSYN) IVPB 2.25 g  2.25 g Intravenous Q8H Carney, Jessica C, RPH 100 mL/hr at 02/23/21 1405 2.25 g at 02/23/21 1405   polyethylene glycol (MIRALAX / GLYCOLAX) packet 17 g  17 g Oral Daily Candee Furbish, MD       QUEtiapine (SEROQUEL) tablet 25 mg  25 mg Oral QHS Candee Furbish, MD   25 mg at 02/22/21 2222   senna-docusate (Senokot-S) tablet 1 tablet  1 tablet Oral QHS PRN Candee Furbish, MD       sodium chloride flush (NS) 0.9 % injection 3 mL  3 mL Intravenous Q12H Charlynne Cousins, MD   3 mL at 02/23/21 1011   sodium chloride flush (NS) 0.9 % injection 3 mL  3 mL Intravenous Q12H Lyda Jester M, PA-C   3 mL at 02/23/21 1011   sodium chloride flush (NS) 0.9 % injection 3 mL  3 mL Intravenous PRN Consuelo Pandy, PA-C         Discharge Medications: Please  see discharge summary for a list of discharge medications.  Relevant Imaging Results:  Relevant Lab Results:   Additional Information SS# [id number], [employer] 02/08/2020, 05/16/2019, 04/20/2019  Erenest Rasher, RN

## 2021-02-23 NOTE — Progress Notes (Signed)
NAME:  Ciarra Braddy, MRN:  [id number], DOB:  [birth year], LOS: 4 ADMISSION DATE:  02/17/2021, CONSULTATION DATE:  02/19/2021 REFERRING MD:  Dr. Olevia Bowens, CHIEF COMPLAINT:  AMS   History of Present Illness:   81 year old female with prior history of COPD, mild pulmonary hypertension, ESRD TTS, PAF on Eliquis, HFpEF, IDDM DMT2, and obesity who presented on 1227 from dialysis with shortness of breath and palpitations.  She did not receive full dialysis treatment, last full treatment on 12/24 at her EDW.  Prior hospitalization 1 week ago at [facility] found to be in A. fib with RVR and placed on Eliquis at that time.  Plans for DCCV however she self converted.  No reports of prior fever, cough, nausea, or vomiting.    Found in ER afebrile, normotensive, and normal oxygenation but in A. fib with RVR in which cardiology was consulted and she was cardioverted back to normal sinus.  She was admitted to [facility], nephrology consulted and underwent iHD on 12/28.  She did have some brief hypotension at the end of iHD but recovered.  Last seen at baseline around 0300 on 12/29.  Around 0730, she was found to be minimally unresponsive with concern for airway protection.  Rapid response at bedside, glucose 206, and no new or sedating meds other than her home remeron last night.  She remains in NSR and normotensive with good O2 saturations.  She was placed on BIPAP, ABG showing 7.283/ 60/ 92/ 27.5.  PCCM consulted and patient transferred to ICU.    Of note, speaking with patient's two daughters, they report patient has been having episodes with difficulty swallowing/ choking episodes at least for two weeks and patient complained of hand tremors 12/28.   Pertinent  Medical History  COPD, former smoker (quit 15 yrs ago) mild pulmonary hypertension, ESRD, PAF on Eliquis, HFpEF, IDDM DMT2, obesity  Significant Hospital Events: Including procedures, antibiotic start and stop dates in addition to other pertinent  events   12/27 Admitted [facility], cards/ nephrology consulted, cardioverted,  12/28 iHD 12/29 unresponsive, LNW 0300, intubated for airway protection, CT, MRI., EEG unremarkable  12/30 extubate  Interim History / Subjective:  Mental status a bit better today. Not eating the pureed diet well. Remains on pressors.  Objective   Blood pressure (!) 137/59, pulse (!) 56, temperature 98.5 F (36.9 C), temperature source Oral, resp. rate 18, height 5\' 3"  (1.6 m), weight 81.6 kg, SpO2 96 %. CVP:  [2 mmHg-11 mmHg] 4 mmHg      Intake/Output Summary (Last 24 hours) at 02/23/2021 1216 Last data filed at 02/23/2021 1200 Gross per 24 hour  Intake 556.02 ml  Output 115 ml  Net 441.02 ml    Filed Weights   02/21/21 0500 02/22/21 0545 02/23/21 0600  Weight: 88.6 kg 84.1 kg 81.6 kg   Examination: Oriented to self and sitatuion, pleasant MMM, trachea midline Ext warm, abdomen soft, lung sounds diminished at bases  BMP/CBC reviewed, notable for persistent leukocytosis  Resolved Hospital Problem list    Assessment & Plan:   Transient coma 12/29 of unclear etiology question exaggerated CO2 narcosis- improved.  MRI/CT/EEG benign. Respiratory failure due to inability to protect airway improved now off vent, extubated 12/30. ESRD on HD Ongoing shock- mostly seems distributive now, off dobutamine, on levophed, low CVPs Cardiomyopathy, new ?stress, CHF team following, plan for R/L cath tomorrow Afib with slow ventricular response- on amio, reduced 02/23/21 ICU delirium- expected given events above, improving LLL Pna- question aspiration during unresponsive event above  Dysphagia- see SLP note 12/31, appreciate help, they are continuing to work with her  - Amio/heparin/cath per CHF team - Hold HD for now - Encourage day/night cycles, continue seroquel qHS - Finish out course of zosyn for presumed aspiration pneumonitis - modified diet per SLP, try some protein shakes, if does not take enough PO may need  cortrak - Titrate levophed to MAP 65, midodrine added by CHF team  Best Practice (right click and "Reselect all SmartList Selections" daily)   Diet/type: modified DVT prophylaxis: hep gtt  GI prophylaxis: PPI Lines: Dialysis Catheter and Arterial Line Foley:  N/A Code Status:  full code Last date of multidisciplinary goals of care discussion: 1/2 daughter updated at bedside, hope for full recovery  32 minutes cc time Erskine Emery MD PCCM

## 2021-02-23 NOTE — Progress Notes (Signed)
Physical Therapy Treatment Patient Details Name: Stephanie Woodward MRN: [id number] DOB: 11-07-1940 Today's Date: 02/23/2021   History of Present Illness Pt adm 12/27 with SOB and found to be in afib with RVR. On 12/28 pt found unresponsive and transferred to ICU and intubated.  Pt placed on CRRT on 12/29. Head CT and MRI negative. Extubated 12/30. PMH - ESRD on HD, afib, chf, DM, copd, gout, chronic back pain    PT Comments    Pt making steady progress and was able to stand with [facility] for transfer to chair. Expect continued steady progress. Pt remains motivated to regain independence.    Recommendations for follow up therapy are one component of a multi-disciplinary discharge planning process, led by the attending physician.  Recommendations may be updated based on patient status, additional functional criteria and insurance authorization.  Follow Up Recommendations  Skilled nursing-short term rehab (<3 hours/day)     Assistance Recommended at Discharge Frequent or constant Supervision/Assistance  Equipment Recommendations  Wheelchair (measurements PT);Wheelchair cushion (measurements PT)    Recommendations for Other Services       Precautions / Restrictions Precautions Precautions: Fall     Mobility  Bed Mobility Overal bed mobility: Needs Assistance Bed Mobility: Supine to Sit     Supine to sit: Max assist;+2 for safety/equipment     General bed mobility comments: Max assist to bring legs off of bed and bring hips to EOB. Mod assist to elevate trunk into sitting    Transfers Overall transfer level: Needs assistance Equipment used: Ambulation equipment used Transfers: Sit to/from Stand;Bed to chair/wheelchair/BSC Sit to Stand: +2 physical assistance;Mod assist           General transfer comment: Assist to bring hips up. Verbal/tactile cues to extend hips/trunk further. Transfer via Lift Equipment: Stedy  Ambulation/Gait             Pre-gait activities: Stood  with Stedy x 2 for 20 sec with emphasis on extending hips/trunk. Initial stand with mod assist to maintain. Second stand with min assist to maintain.     Stairs             Wheelchair Mobility    Modified Rankin (Stroke Patients Only)       Balance Overall balance assessment: Needs assistance Sitting-balance support: No upper extremity supported;Feet unsupported Sitting balance-Leahy Scale: Fair     Standing balance support: Bilateral upper extremity supported Standing balance-Leahy Scale: Poor Standing balance comment: Stedy and min to mod assist for static standing                            Cognition Arousal/Alertness: Awake/alert Behavior During Therapy: Flat affect Overall Cognitive Status: Impaired/Different from baseline Area of Impairment: Memory;Following commands;Safety/judgement;Awareness;Problem solving                     Memory: Decreased short-term memory Following Commands: Follows one step commands with increased time;Follows one step commands consistently Safety/Judgement: Decreased awareness of deficits Awareness: Intellectual Problem Solving: Slow processing;Requires verbal cues          Exercises      General Comments General comments (skin integrity, edema, etc.): VSS      Pertinent Vitals/Pain Pain Assessment: No/denies pain Faces Pain Scale: Hurts little more Pain Location: bottom Pain Descriptors / Indicators: Discomfort Pain Intervention(s): Limited activity within patient's tolerance;Monitored during session;Repositioned (RN made aware)    Home Living  Prior Function            PT Goals (current goals can now be found in the care plan section) Acute Rehab PT Goals Patient Stated Goal: not stated Progress towards PT goals: Progressing toward goals    Frequency    Min 3X/week      PT Plan Current plan remains appropriate    Co-evaluation               AM-PAC PT "6 Clicks" Mobility   Outcome Measure  Help needed turning from your back to your side while in a flat bed without using bedrails?: Total Help needed moving from lying on your back to sitting on the side of a flat bed without using bedrails?: A Lot Help needed moving to and from a bed to a chair (including a wheelchair)?: Total Help needed standing up from a chair using your arms (e.g., wheelchair or bedside chair)?: Total Help needed to walk in hospital room?: Total Help needed climbing 3-5 steps with a railing? : Total 6 Click Score: 7    End of Session   Activity Tolerance: Patient tolerated treatment well Patient left: in chair;with call bell/phone within reach;with nursing/sitter in room Nurse Communication: Mobility status;Need for lift equipment (Nurse assisted with transfer) PT Visit Diagnosis: Other abnormalities of gait and mobility (R26.89);Muscle weakness (generalized) (M62.81);Difficulty in walking, not elsewhere classified (R26.2)     Time: 1224-4975 PT Time Calculation (min) (ACUTE ONLY): 16 min  Charges:  $Therapeutic Activity: 8-22 mins                     [place] Pager [phone] Office [place] 02/23/2021, 12:06 PM

## 2021-02-23 NOTE — Progress Notes (Signed)
[facility] Progress Note   Subjective:    CRRT stopped on 1/1 morning - she had 271 mL UF off prior to stopping.  Spoke with her RN - levo is on at 5 mcg/min - she has an a line with low diastolic.  She's been appropriate overnight and is sleepy but does answer my questions.   Review of systems:  Denies shortness of breath or chest pain  Denies n/v  Objective Vitals:   02/23/21 0415 02/23/21 0430 02/23/21 0500 02/23/21 0530  BP: (!) 145/52 (!) 144/55 (!) 150/49 (!) 146/53  Pulse: 62 (!) 55 61 63  Resp: 18 18 15  (!) 21  Temp:      TempSrc:      SpO2: 94% 96% 94% 97%  Weight:      Height:       Physical Exam General: elderly female in bed in NAD  HEENT NCAT Neck: supple trachea midline Heart: Irregular; S1S2 no rub Lungs: Clear to auscultation; unlabored on room air  Abdomen: soft/nt/obese habitus Extremities: No edema lower extremities Dialysis Access: LUE AVF with bruit and thrill; RIJ nontunneled dialysis catheter  OP HD:  [place] [facility]     4h  350/500  87.2kg  2/2 bath  P2  Hep none   - hect 5 ug tiw   - venofer 50 q wk  - no esa     CXR 12/27 - IMPRESSION: Cardiomegaly with mild pulmonary interstitial edema and trace bilateral pleural effusions suggesting CHF.    CXR 12/29 - IMPRESSION: Cardiomegaly, pulmonary vascular congestion and increased hazy opacities since previous study which may represent increasing edema/infiltrate.  Assessment/Plan: ESRD - on HD TTS.  Moved to ICU, CRRT started 12/30 and stopped 1/1.  For HD on 02/24/21 per TTS schedule.  Retain nontunneled HD catheter for now AMS - delirium. Neuro consulted. improved Acute resp insufficiency - extubated 12/30. +fever/ ^wbc/ pulm infiltrates. S/p broad spectrum IV abx w/ vanc/ zosyn. Extra volume removed w/ CRRT with CXR and exam improved (3-4 kg under dry weight and CVP low after that).  Optimize volume with HD Hypotension - pressors per primary team  Paroxysmal Afib - cardioverted in ED  12/27.  po amio and IV heparin per primary team Anemia of CKD- Hb acceptable  MBD ckd - Cont hectorol here. No binders on home med list and phos acceptable   Additional Objective Labs: Basic Metabolic Panel: CBC: Recent Labs  Lab 02/19/21 0335 02/19/21 1052 02/19/21 1436 02/20/21 1009 02/21/21 0401 02/22/21 0830 02/23/21 0339  WBC 14.7* 13.0*  --  16.6* 16.4*  --  15.5*  NEUTROABS 12.0* 11.6*  --   --   --   --   --   HGB 11.0* 10.9*   < > 12.0 10.5* 11.6* 10.7*  HCT 36.3 33.6*   < > 36.3 32.7* 34.0* 34.3*  MCV 100.8* 96.6  --  94.8 95.3  --  99.7  PLT 338 271  --  294 288  --  314   < > = values in this interval not displayed.   Recent Labs  Lab 02/22/21 1151 02/22/21 1555 02/22/21 1941 02/22/21 2329 02/23/21 0340  GLUCAP 174* 183* 145* 203* 208*   Iron Studies: No results for input(s): IRON, TIBC, TRANSFERRIN, FERRITIN in the last 72 hours. Lab Results  Component Value Date   INR 1.1 11/17/2018   Medications:  sodium chloride Stopped (02/22/21 0739)   sodium chloride     heparin 1,000 Units/hr (02/23/21 0300)  norepinephrine (LEVOPHED) Adult infusion 7 mcg/min (02/23/21 0300)   piperacillin-tazobactam (ZOSYN)  IV Stopped (02/22/21 2040)    amiodarone  200 mg Oral BID   arformoterol  15 mcg Nebulization BID   atorvastatin  80 mg Oral QPM   budesonide (PULMICORT) nebulizer solution  0.5 mg Nebulization BID   Chlorhexidine Gluconate Cloth  6 each Topical Q0600   docusate  100 mg Oral BID   [START ON 02/24/2021] doxercalciferol  5 mcg Intravenous Q T,Th,Sa-HD   insulin aspart  0-9 Units Subcutaneous Q4H   latanoprost  1 drop Both Eyes QHS   lidocaine  1 patch Transdermal Q24H   multivitamin  1 tablet Oral QHS   pantoprazole (PROTONIX) IV  40 mg Intravenous Q24H   polyethylene glycol  17 g Oral Daily   QUEtiapine  25 mg Oral QHS   sodium chloride flush  3 mL Intravenous Q12H   vancomycin variable dose per unstable renal function (pharmacist dosing)   Does not  apply See admin instructions    Claudia Desanctis, MD 02/23/2021 6:20 AM

## 2021-02-23 NOTE — TOC Initial Note (Signed)
Transition of Care [facility] - Initial/Assessment Note    Patient Details  Name: Stephanie Woodward MRN: [id number] Date of Birth: 1941/01/31  Transition of Care [facility]) CM/SW Contact:    Erenest Rasher, RN Phone Number: [phone] 02/23/2021, 3:18 PM  Clinical Narrative:                 HF TOC CM spoke to pt and pt's dtr, Stephanie at bedside. States she has stairs in her home, if pt dc home she will need a hospital bed for her downstairs. She will need to take FMLA but does not feel she can manage her at home alone. Discussed SNF rehab. Agreeable to TOC CM creating FL2 and faxing referral. Provided her with contact for [employer]. She wanted to follow up on SNF rehab that accept HTA. Pt currently lives alone in her [address] Dtr states she was living independently taking SCAT to her Hemodiaylsis Tues, Thru and Sat.   Expected Discharge Plan: Skilled Nursing Facility Barriers to Discharge: Continued Medical Work up   Patient Goals and CMS Choice Patient states their goals for this hospitalization and ongoing recovery are:: recover to previous baseline CMS Medicare.gov Compare Post Acute Care list provided to:: Patient Represenative (must comment) (daughter-Stephanie Ruffin) Choice offered to / list presented to : Adult Children  Expected Discharge Plan and Services Expected Discharge Plan: [place] In-house Referral: Clinical Social Work Discharge Planning Services: CM Consult Post Acute Care Choice: [place] Living arrangements for the past 2 months: Apartment   Prior Living Arrangements/Services Living arrangements for the past 2 months: Apartment Lives with:: Self Patient language and need for interpreter reviewed:: Yes Do you feel safe going back to the place where you live?: Yes      Need for Family Participation in Patient Care: Yes (Comment) Care giver support system in place?: Yes (comment) Current home services: DME  (raised toilet, rolling walker with seat) Criminal Activity/Legal Involvement Pertinent to Current Situation/Hospitalization: No - Comment as needed  Activities of Daily Living   ADL Screening (condition at time of admission) Patient's cognitive ability adequate to safely complete daily activities?: Yes  Permission Sought/Granted Permission sought to share information with : Case Manager, Family Supports, PCP Permission granted to share information with : Yes, Verbal Permission Granted  Share Information with NAME: Jobe Igo, [place] granted to share info w AGENCY: SNF rehab, Home Health, DME  Permission granted to share info w Relationship: daughtgers  Permission granted to share info w Contact Information: [phone], [phone]  Emotional Assessment Appearance:: Appears stated age Attitude/Demeanor/Rapport: Engaged Affect (typically observed): Accepting Orientation: : Oriented to Self, Oriented to Place, Oriented to  Time, Oriented to Situation   Psych Involvement: No (comment)  Admission diagnosis:  Acute on chronic diastolic CHF (congestive heart failure) (HCC) [I50.33] Acute respiratory failure with hypercapnia (HCC) [J96.02] Patient Active Problem List   Diagnosis Date Noted   Malnutrition of moderate degree 02/21/2021   Acute respiratory failure with hypoxia ([place]) 02/19/2021   Acute on chronic diastolic CHF (congestive heart failure) ([place]) 02/17/2021   ESRD on hemodialysis ([place]) 02/17/2021   Nausea without vomiting 05/16/2020   Paroxysmal atrial fibrillation ([place]) 10/31/2019   Secondary hypercoagulable state ([place]) 10/31/2019   Type 2 diabetes mellitus with hyperglycemia, with long-term current use of insulin ([place]) 02/02/2019   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 ([place]) 02/02/2019   Type 2 diabetes mellitus with stage 4 chronic kidney disease, with long-term  current use of insulin ([place]) 02/02/2019   Acute renal  failure superimposed on chronic kidney disease ([place]) 01/24/2019   Leukocytosis 01/24/2019   Hypokalemia 01/24/2019   Atrial fibrillation, chronic ([place]) 01/24/2019   Chronic anticoagulation 01/24/2019   Chronic kidney disease (CKD), stage IV (severe) (HCC) 10/31/2018   Paroxysmal atrial fibrillation with RVR ([place]) 04/08/2016   Chest pain 04/04/2016   Acute on chronic renal failure ([place]) 03/09/2016   Diabetes mellitus with complication ([place]) 87/56/4332   Foot pain, bilateral 03/09/2016   Renal disorder    Bradycardia    CHF (congestive heart failure) ([place]) 02/06/2014   Cellulitis of right lower extremity 02/06/2014   Essential hypertension 02/06/2014   Hyperlipidemia 02/06/2014   Uncontrolled type 2 diabetes mellitus with hypoglycemia ([place]) 02/06/2014   Cellulitis 02/06/2014   Hypoxemia 12/07/2013   Type 2 diabetes mellitus, controlled, with renal complications ([place]) 95/18/8416   Kidney disease, chronic, stage III (GFR 30-59 ml/min) ([place]) 12/07/2013   Pulmonary hypertension ([place]) 60/63/0160   Chronic systolic congestive heart failure (HCC)    SOB (shortness of breath) 12/05/2013   COPD (chronic obstructive pulmonary disease) ([place])    Gout 11/11/2009   Obesity 11/11/2009   Hypertensive heart disease    PCP:  Larey Dresser, MD Pharmacy:   [facility], [address] [place] [address] Phone: [phone] Fax: [phone]  [facility], [address] [place] [address] Phone: [phone] Fax: [phone], [facility] (New Address) - [place], [place] - [address] Previously: [facility] [place], [address] [place] [address] Phone: [phone] Fax: [phone]     Social Determinants of Health ([place]) Interventions    Readmission Risk  Interventions No flowsheet data found.

## 2021-02-24 ENCOUNTER — Encounter (HOSPITAL_COMMUNITY): Payer: HMO

## 2021-02-24 ENCOUNTER — Telehealth: Payer: Self-pay

## 2021-02-24 ENCOUNTER — Encounter (HOSPITAL_COMMUNITY)
Admission: EM | Disposition: A | Discharge status: Skilled Nursing Facility | Payer: Self-pay | Source: Ambulatory Visit | Attending: Internal Medicine

## 2021-02-24 DIAGNOSIS — I5033 Acute on chronic diastolic (congestive) heart failure: Secondary | ICD-10-CM | POA: Diagnosis not present

## 2021-02-24 DIAGNOSIS — I480 Paroxysmal atrial fibrillation: Secondary | ICD-10-CM | POA: Diagnosis not present

## 2021-02-24 DIAGNOSIS — I429 Cardiomyopathy, unspecified: Secondary | ICD-10-CM | POA: Diagnosis not present

## 2021-02-24 HISTORY — PX: LEFT HEART CATH AND CORONARY ANGIOGRAPHY: CATH118249

## 2021-02-24 LAB — CBC
HCT: 34.3 % — ABNORMAL LOW (ref 36.0–46.0)
Hemoglobin: 11.1 g/dL — ABNORMAL LOW (ref 12.0–15.0)
MCH: 31.9 pg (ref 26.0–34.0)
MCHC: 32.4 g/dL (ref 30.0–36.0)
MCV: 98.6 fL (ref 80.0–100.0)
Platelets: 288 10*3/uL (ref 150–400)
RBC: 3.48 MIL/uL — ABNORMAL LOW (ref 3.87–5.11)
RDW: 14.6 % (ref 11.5–15.5)
WBC: 14.4 10*3/uL — ABNORMAL HIGH (ref 4.0–10.5)
nRBC: 1.1 % — ABNORMAL HIGH (ref 0.0–0.2)

## 2021-02-24 LAB — HEPARIN LEVEL (UNFRACTIONATED): Heparin Unfractionated: 0.36 IU/mL (ref 0.30–0.70)

## 2021-02-24 LAB — BASIC METABOLIC PANEL
Anion gap: 12 (ref 5–15)
BUN: 27 mg/dL — ABNORMAL HIGH (ref 8–23)
CO2: 20 mmol/L — ABNORMAL LOW (ref 22–32)
Calcium: 9.1 mg/dL (ref 8.9–10.3)
Chloride: 102 mmol/L (ref 98–111)
Creatinine, Ser: 5.38 mg/dL — ABNORMAL HIGH (ref 0.44–1.00)
GFR, Estimated: 8 mL/min — ABNORMAL LOW (ref >60)
Glucose, Bld: 174 mg/dL — ABNORMAL HIGH (ref 70–99)
Potassium: 4 mmol/L (ref 3.5–5.1)
Sodium: 134 mmol/L — ABNORMAL LOW (ref 135–145)

## 2021-02-24 LAB — POCT ACTIVATED CLOTTING TIME: Activated Clotting Time: 155 seconds

## 2021-02-24 LAB — GLUCOSE, CAPILLARY
Glucose-Capillary: 167 mg/dL — ABNORMAL HIGH (ref 70–99)
Glucose-Capillary: 151 mg/dL — ABNORMAL HIGH (ref 70–99)
Glucose-Capillary: 143 mg/dL — ABNORMAL HIGH (ref 70–99)
Glucose-Capillary: 105 mg/dL — ABNORMAL HIGH (ref 70–99)
Glucose-Capillary: 110 mg/dL — ABNORMAL HIGH (ref 70–99)

## 2021-02-24 LAB — CULTURE, BLOOD (ROUTINE X 2)
Culture: NO GROWTH
Culture: NO GROWTH

## 2021-02-24 SURGERY — LEFT HEART CATH AND CORONARY ANGIOGRAPHY
Anesthesia: LOCAL

## 2021-02-24 MED ORDER — LIDOCAINE HCL (PF) 1 % IJ SOLN
INTRAMUSCULAR | Status: AC
  Filled 2021-02-24: qty 30

## 2021-02-24 MED ORDER — SODIUM CHLORIDE 0.9 % IV SOLN
2.0000 g | INTRAVENOUS | Status: DC
  Filled 2021-02-24: qty 20

## 2021-02-24 MED ORDER — ONDANSETRON HCL 4 MG/2ML IJ SOLN
4.0000 mg | Freq: Four times a day (QID) | INTRAMUSCULAR | Status: DC | PRN
  Filled 2021-02-24: qty 2

## 2021-02-24 MED ORDER — HEPARIN (PORCINE) IN NACL 1000-0.9 UT/500ML-% IV SOLN
INTRAVENOUS | Status: AC
  Filled 2021-02-24: qty 500

## 2021-02-24 MED ORDER — SODIUM CHLORIDE 0.9 % IV SOLN
2.0000 g | INTRAVENOUS | Status: AC
  Administered 2021-02-24: 2 g via INTRAVENOUS
  Filled 2021-02-24 (×2): qty 20

## 2021-02-24 MED ORDER — IOHEXOL 350 MG/ML SOLN
INTRAVENOUS | Status: DC | PRN
  Administered 2021-02-24: 90 mL via INTRA_ARTERIAL

## 2021-02-24 MED ORDER — MIDAZOLAM HCL 2 MG/2ML IJ SOLN
INTRAMUSCULAR | Status: AC
  Filled 2021-02-24: qty 2

## 2021-02-24 MED ORDER — HEPARIN (PORCINE) IN NACL 1000-0.9 UT/500ML-% IV SOLN
INTRAVENOUS | Status: DC | PRN
  Administered 2021-02-24 (×2): 500 mL

## 2021-02-24 MED ORDER — SODIUM CHLORIDE 0.9% FLUSH
3.0000 mL | Freq: Two times a day (BID) | INTRAVENOUS | Status: DC
  Administered 2021-02-25: 3 mL via INTRAVENOUS

## 2021-02-24 MED ORDER — HYDRALAZINE HCL 20 MG/ML IJ SOLN: 10.0000 mg | INTRAMUSCULAR | Status: AC | PRN

## 2021-02-24 MED ORDER — MIDAZOLAM HCL 2 MG/2ML IJ SOLN
INTRAMUSCULAR | Status: DC | PRN
  Administered 2021-02-24 (×2): .5 mg via INTRAVENOUS

## 2021-02-24 MED ORDER — SODIUM CHLORIDE 0.9 % IV SOLN: 250.0000 mL | INTRAVENOUS | Status: DC | PRN

## 2021-02-24 MED ORDER — LABETALOL HCL 5 MG/ML IV SOLN: 10.0000 mg | INTRAVENOUS | Status: AC | PRN

## 2021-02-24 MED ORDER — SODIUM CHLORIDE 0.9 % IV SOLN: INTRAVENOUS | Status: DC | PRN

## 2021-02-24 MED ORDER — ACETAMINOPHEN 325 MG PO TABS: 650.0000 mg | ORAL_TABLET | ORAL | Status: DC | PRN

## 2021-02-24 MED ORDER — SODIUM CHLORIDE 0.9% FLUSH: 3.0000 mL | INTRAVENOUS | Status: DC | PRN

## 2021-02-24 MED ORDER — FENTANYL CITRATE (PF) 100 MCG/2ML IJ SOLN
INTRAMUSCULAR | Status: DC | PRN
  Administered 2021-02-24 (×2): 25 ug via INTRAVENOUS

## 2021-02-24 MED ORDER — FENTANYL CITRATE (PF) 100 MCG/2ML IJ SOLN
INTRAMUSCULAR | Status: AC
  Filled 2021-02-24: qty 2

## 2021-02-24 MED ORDER — LIDOCAINE HCL (PF) 1 % IJ SOLN
INTRAMUSCULAR | Status: DC | PRN
  Administered 2021-02-24: 15 mL via INTRADERMAL

## 2021-02-24 SURGICAL SUPPLY — 13 items
BAND VASC HEMOSTAT LNG 27 (HEMOSTASIS) ×1 IMPLANT
CATH INFINITI 5 FR JL3.5 (CATHETERS) ×1 IMPLANT
CATH INFINITI 5FR MULTPACK ANG (CATHETERS) ×1 IMPLANT
KIT HEART LEFT (KITS) ×3 IMPLANT
PACK CARDIAC CATHETERIZATION (CUSTOM PROCEDURE TRAY) ×3 IMPLANT
PINNACLE LONG 5F 25CM (SHEATH) ×2
SHEATH INTROD PINNACLE 5F 25CM (SHEATH) IMPLANT
SHEATH PINNACLE 5F 10CM (SHEATH) ×1 IMPLANT
SHEATH PROBE COVER 6X72 (BAG) ×1 IMPLANT
TRANSDUCER W/STOPCOCK (MISCELLANEOUS) ×3 IMPLANT
TUBING CIL FLEX 10 FLL-RA (TUBING) ×3 IMPLANT
WIRE EMERALD 3MM-J .035X150CM (WIRE) ×1 IMPLANT
WIRE HI TORQ VERSACORE-J 145CM (WIRE) ×1 IMPLANT

## 2021-02-24 NOTE — TOC Progression Note (Addendum)
Transition of Care [facility]) - Progression Note    Patient Details  Name: Stephanie Woodward MRN: [id number] Date of Birth: [birth year]  [facility]) CM/SW Contact  Stephanie Szabo, LCSW Phone Number: 02/24/2021, 12:36 PM  Clinical Narrative:    HF CSW reached out to [facility] [place] to see about bed availability and CSW had to leave a voicemail for her to return the call. 2:23pm - HF CSW heard back from [facility] and they cannot hold a bed for [name] and to let her know closer to DC but that they will need to move the HD time to MWF. HF CSW reached out to Stephanie Woodward to see about [place] HD chair time on MWF. Stephanie Woodward reported [facility] has a MWF with 12:10 arrival/12:25 chair time. [facility] reported that would work but they cannot hold a bed and for the CSW to reach out when [name] is closer to DC and Stephanie Woodward reported the same. CSW to keep everyone in the loop about DC.   HF CSW spoke to [place] at [facility] who reported she may have something tomorrow but will need insurance authorization from [employer]. HF CSW called [employer] but had to leave a voicemail for them to return the call. CSW informed Stephanie Woodward about [name] needing HD and Stephanie Woodward reported they do not work with [facility].  HF CSW will start insurance authorization closer to time of discharge with [employer].  2:29pm - HF CSW spoke with the patient's daughter, Stephanie Woodward [phone] and she reported she just left the hospital. CSW presented the possible bed offer from [facility] depending on discharge and medical readiness and provided Stephanie Woodward with [facility]'s information as Stephanie Woodward reported she would like to check out the facility first as they didn't have a good experience with their father. CSW reported that it all depends on her mother's progress and when she will be medically ready for discharge as [facility] doesn't hold beds for patients. CSW also reported that Ms. Beechy HD chair  time would need to change to MWF if [facility] is a possibility at time of discharge. Stephanie Woodward had several questions about the discharge process and next steps and CSW provided clarity and will keep Stephanie Woodward in the loop pending discharge and medically readiness.  CSW will continue to follow throughout discharge.   Expected Discharge Plan: [place] Barriers to Discharge: Continued Medical Work up  Expected Discharge Plan and Services Expected Discharge Plan: [place] In-house Referral: Clinical Social Work Discharge Planning Services: CM Consult Post Acute Care Choice: [place] Living arrangements for the past 2 months: Apartment                                       Social Determinants of Health (SDOH) Interventions    Readmission Risk Interventions No flowsheet data found.

## 2021-02-24 NOTE — Interval H&P Note (Signed)
History and Physical Interval Note:  02/24/2021 1:16 PM  Stephanie Woodward  has presented today for surgery, with the diagnosis of NSTEMI.  The various methods of treatment have been discussed with the patient and family. After consideration of risks, benefits and other options for treatment, the patient has consented to  Procedure(s): LEFT HEART CATH AND CORONARY ANGIOGRAPHY (N/A) as a surgical intervention.  The patient's history has been reviewed, patient examined, no change in status, stable for surgery.  I have reviewed the patient's chart and labs.  Questions were answered to the patient's satisfaction.     Hermon Zea [employer]

## 2021-02-24 NOTE — Progress Notes (Signed)
[facility] Progress Note   Subjective:    States she feels much better today.  Off CRRT since 1/1 am.  She has been in the ICU.  She is for PCI today per nursing.  New EF drop.  She is off of pressors but has just been taken off and has been labile per nursing.  Per RN CVP is 6-8 most recently.   Review of systems:   Denies shortness of breath  Some discomfort in her chest above her stomach last night for a few minutes Denies n/v  Objective Vitals:   02/24/21 0030 02/24/21 0045 02/24/21 0100 02/24/21 0500  BP: (!) 141/45 (!) 155/61 (!) 143/51   Pulse: (!) 46 (!) 52 (!) 49   Resp: 13 14 17    Temp:      TempSrc:      SpO2: 100% 99% 99%   Weight:    83.5 kg  Height:       Physical Exam  General: elderly female in bed in NAD  HEENT NCAT Neck: supple trachea midline Heart: Irregular; S1S2 no rub Lungs: Clear to auscultation; unlabored on room air  Abdomen: soft/nt/obese habitus Extremities: No edema lower extremities Psych normal mood and affect Neuro -awake on arrival and greets me; alert and oriented x 3 provides hx and follows commands Dialysis Access: LUE AVF with bruit and thrill; RIJ nontunneled dialysis catheter  OP HD:  [facility]     4h  350/500  87.2kg  2/2 bath  P2  Hep none   - hect 5 ug tiw   - venofer 50 q wk  - no esa     CXR 12/27 - IMPRESSION: Cardiomegaly with mild pulmonary interstitial edema and trace bilateral pleural effusions suggesting CHF.    CXR 12/29 - IMPRESSION: Cardiomegaly, pulmonary vascular congestion and increased hazy opacities since previous study which may represent increasing edema/infiltrate.  Assessment/Plan: ESRD - CRRT started 12/30 and stopped 1/1.  For HD on 02/24/21 per her usual TTS schedule.  Retain nontunneled HD catheter for now AMS - delirium. Neuro consulted. Improved on my  Acute resp insufficiency - extubated 12/30. +fever/ ^wbc/ pulm infiltrates. S/p broad spectrum IV abx w/ vanc/ zosyn. Extra volume  removed w/ CRRT with CXR and exam improved (3-4 kg under dry weight and CVP low after that).  Optimize volume with HD Hypotension - pressors per primary team - currently off.  She is also on midodrine 5 mg TID.  Paroxysmal Afib - cardioverted in ED 12/27.  po amio and IV heparin per primary team Anemia of CKD- Hb acceptable; defer ESA For now  MBD ckd - Cont hectorol here. No binders on home med list and phos slightly low last check. phos in am.   Additional Objective Labs: Basic Metabolic Panel: CBC: Recent Labs  Lab 02/19/21 0335 02/19/21 1052 02/19/21 1436 02/20/21 1009 02/21/21 0401 02/22/21 0830 02/23/21 0339 02/24/21 0400  WBC 14.7* 13.0*  --  16.6* 16.4*  --  15.5* 14.4*  NEUTROABS 12.0* 11.6*  --   --   --   --   --   --   HGB 11.0* 10.9*   < > 12.0 10.5* 11.6* 10.7* 11.1*  HCT 36.3 33.6*   < > 36.3 32.7* 34.0* 34.3* 34.3*  MCV 100.8* 96.6  --  94.8 95.3  --  99.7 98.6  PLT 338 271  --  294 288  --  314 288   < > = values in this interval not displayed.  Recent Labs  Lab 02/23/21 1154 02/23/21 1542 02/23/21 2037 02/23/21 2323 02/24/21 0357  GLUCAP 273* 272* 265* 174* 167*   Iron Studies: No results for input(s): IRON, TIBC, TRANSFERRIN, FERRITIN in the last 72 hours. Lab Results  Component Value Date   INR 1.1 11/17/2018   Medications:  sodium chloride Stopped (02/22/21 0739)   sodium chloride     sodium chloride     sodium chloride     heparin 1,000 Units/hr (02/24/21 0100)   norepinephrine (LEVOPHED) Adult infusion 6 mcg/min (02/24/21 0100)   piperacillin-tazobactam (ZOSYN)  IV 2.25 g (02/24/21 0600)    amiodarone  200 mg Oral Daily   arformoterol  15 mcg Nebulization BID   atorvastatin  80 mg Oral QPM   budesonide (PULMICORT) nebulizer solution  0.5 mg Nebulization BID   Chlorhexidine Gluconate Cloth  6 each Topical Q0600   Chlorhexidine Gluconate Cloth  6 each Topical Q0600   docusate  100 mg Oral BID   doxercalciferol  5 mcg Intravenous Q  T,Th,Sa-HD   feeding supplement (NEPRO CARB STEADY)  237 mL Oral TID WC   insulin aspart  0-9 Units Subcutaneous Q4H   latanoprost  1 drop Both Eyes QHS   lidocaine  1 patch Transdermal Q24H   midodrine  5 mg Oral TID WC   multivitamin  1 tablet Oral QHS   pantoprazole (PROTONIX) IV  40 mg Intravenous Q24H   polyethylene glycol  17 g Oral Daily   QUEtiapine  25 mg Oral QHS   sodium chloride flush  3 mL Intravenous Q12H   sodium chloride flush  3 mL Intravenous Q12H    Claudia Desanctis, MD 02/24/2021 6:14 AM

## 2021-02-24 NOTE — Progress Notes (Signed)
NAME:  Stephanie Woodward, MRN:  [id number], DOB:  1940-04-30, LOS: 5 ADMISSION DATE:  02/17/2021, CONSULTATION DATE:  02/19/2021 REFERRING MD:  Dr. Olevia Bowens, CHIEF COMPLAINT:  AMS   History of Present Illness:   81 year old female with prior history of COPD, mild pulmonary hypertension, ESRD TTS, PAF on Eliquis, HFpEF, IDDM DMT2, and obesity who presented on 1227 from dialysis with shortness of breath and palpitations.  She did not receive full dialysis treatment, last full treatment on 12/24 at her EDW.  Prior hospitalization 1 week ago at [facility] found to be in A. fib with RVR and placed on Eliquis at that time.  Plans for DCCV however she self converted.  No reports of prior fever, cough, nausea, or vomiting.    Found in ER afebrile, normotensive, and normal oxygenation but in A. fib with RVR in which cardiology was consulted and she was cardioverted back to normal sinus.  She was admitted to [facility], nephrology consulted and underwent iHD on 12/28.  She did have some brief hypotension at the end of iHD but recovered.  Last seen at baseline around 0300 on 12/29.  Around 0730, she was found to be minimally unresponsive with concern for airway protection.  Rapid response at bedside, glucose 206, and no new or sedating meds other than her home remeron last night.  She remains in NSR and normotensive with good O2 saturations.  She was placed on BIPAP, ABG showing 7.283/ 60/ 92/ 27.5.  PCCM consulted and patient transferred to ICU.    Of note, speaking with patient's two daughters, they report patient has been having episodes with difficulty swallowing/ choking episodes at least for two weeks and patient complained of hand tremors 12/28.   Pertinent  Medical History  COPD, former smoker (quit 15 yrs ago) mild pulmonary hypertension, ESRD, PAF on Eliquis, HFpEF, IDDM DMT2, obesity  Significant Hospital Events: Including procedures, antibiotic start and stop dates in addition to other pertinent  events   12/27 Admitted [facility], cards/ nephrology consulted, cardioverted,  12/28 iHD 12/29 unresponsive, LNW 0300, intubated for airway protection, CT, MRI., EEG unremarkable  12/30 extubate 1/3 Intermittently on and off levo  Interim History / Subjective:  Remains on levo NPO for cath  Objective   Blood pressure (!) 136/40, pulse (!) 42, temperature 97.6 F (36.4 C), temperature source Oral, resp. rate 13, height 5\' 3"  (1.6 m), weight 83.5 kg, SpO2 100 %. CVP:  [4 mmHg-12 mmHg] 6 mmHg      Intake/Output Summary (Last 24 hours) at 02/24/2021 0755 Last data filed at 02/24/2021 0700 Gross per 24 hour  Intake 1238.17 ml  Output --  Net 1238.17 ml   Filed Weights   02/22/21 0545 02/23/21 0600 02/24/21 0500  Weight: 84.1 kg 81.6 kg 83.5 kg   Physical Exam: General: Well-appearing, no acute distress HENT: [place], AT, OP clear, MMM Eyes: EOMI, no scleral icterus Respiratory: Clear to auscultation bilaterally.  No crackles, wheezing or rales Cardiovascular: RRR, -M/R/G, no JVD GI: BS+, soft, nontender Extremities:-Edema,-tenderness Neuro: AAO x4, CNII-XII grossly intact Lines: Right perm cath  Slightly improving leukocytosis with WBC 14 BMET reviewed and ok  Resolved Hospital Problem list    Assessment & Plan:   Transient coma 12/29 of unclear etiology question exaggerated CO2 narcosis- resolved.  MRI/CT/EEG benign. ICU delirium- expected given events above, improving.  - Continue seroquel nightly - Re-orient day and night  Respiratory failure due to inability to protect airway improved now off vent, extubated 12/30. Resolved. Currently on  RA.  LLL Pna- question aspiration during unresponsive event above Trach aspiration - normal flora - De-escalate antibiotics to Ceftriaxone for 7 days total - BID Brovana and Pulmicort  Ongoing shock- mostly seems distributive now, off dobutamine. Remains on low dose levophed.  Cardiomyopathy, new ?stress,  Afib with slow ventricular  response - Wean levophed for goal MAP >60  - Started on midodrine 1/2 - CHF team following, NPO for R/L cath today - on amio, reduced 02/23/21  ESRD on HD  - HD per Nephrology: scheduled for today  Dysphagia - see SLP note 12/31, appreciate help, they are continuing to work with her  Best Practice (right click and "Reselect all SmartList Selections" daily)   Diet/type: modified DVT prophylaxis: hep gtt  GI prophylaxis: PPI Lines: Dialysis Catheter and Arterial Line Foley:  N/A Code Status:  full code Last date of multidisciplinary goals of care discussion: 1/2 daughter updated at bedside, hope for full recovery  The patient is critically ill with shock requiring levopressors and requires high complexity decision making for assessment and support, frequent evaluation and titration of therapies, application of advanced monitoring technologies and extensive interpretation of multiple databases.  Independent Critical Care Time: 40 Minutes.   Rodman Pickle, M.D. [facility] Pulmonary/Critical Care Medicine 02/24/2021 7:55 AM   Please see Amion for pager number to reach on-call Pulmonary and Critical Care Team.

## 2021-02-24 NOTE — Progress Notes (Addendum)
Patient ID: Stephanie Woodward, female   DOB: May 25, 1940, 81 y.o.   MRN: [id number]     Advanced Heart Failure Rounding Note  PCP-Cardiologist: Ezzard Standing, MD   Subjective:    12/29: Found unresponsive, no arrhythmias noted. Intubated. Started on NE.  MRI brain without acute process. 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  Had fever to 101F. Started on broad spectrum abx and pan cultures sent.  12/30: CVVH started. Extubated.  Echo reviewed: EF 30-35% range, worse function in septum; mild RV dysfunction, IVC dilated.  01/01: CRRT stopped  Off CRRT. Going for HD today. CVP 13  Weaned off NE overnight but restarted this am d/t difficulty maintaining MAP. However, SBP consistently > 831D, diastolic BP low.   ? Wandering atrial pacemaker on telemetry this am, rates 40s. No clear AF.  LHC today  Evaluated by SLP, dysphagia 3 diet recommended  Sitting up in chair. No dyspnea. Alert and oriented.  Objective:   Weight Range: 83.5 kg Body mass index is 32.61 kg/m.   Vital Signs:   Temp:  [97.6 F (36.4 C)-98.5 F (36.9 C)] 97.6 F (36.4 C) (01/03 0740) Pulse Rate:  [39-77] 39 (01/03 0800) Resp:  [8-25] 17 (01/03 0800) BP: (106-175)/(25-122) 130/49 (01/03 0800) SpO2:  [67 %-100 %] 100 % (01/03 0800) Arterial Line BP: (59-173)/(26-134) 141/44 (01/03 0800) Weight:  [83.5 kg] 83.5 kg (01/03 0500) Last BM Date: 02/23/21  Weight change: Filed Weights   02/22/21 0545 02/23/21 0600 02/24/21 0500  Weight: 84.1 kg 81.6 kg 83.5 kg    Intake/Output:   Intake/Output Summary (Last 24 hours) at 02/24/2021 0817 Last data filed at 02/24/2021 0700 Gross per 24 hour  Intake 1048.12 ml  Output --  Net 1048.12 ml      Physical Exam    CVP 13 General:  Well appearing. No resp difficulty HEENT: normal, R IJ CVC Neck: supple. no JVD. Carotids 2+ bilat; no bruits. No lymphadenopathy or thryomegaly appreciated. Cor: PMI  nondisplaced. Irregular rhythm, bradycardic. No rubs, gallops or murmurs. Lungs: clear Abdomen: soft, nontender, nondistended. No hepatosplenomegaly. No bruits or masses. Good bowel sounds. Extremities: no cyanosis, clubbing, rash, edema Neuro: alert & orientedx3, cranial nerves grossly intact. moves all 4 extremities w/o difficulty. Affect pleasant    Telemetry   ? Wandering atrial pacemaker, 40s (personally reviewed)  Labs    CBC Recent Labs    02/23/21 0339 02/24/21 0400  WBC 15.5* 14.4*  HGB 10.7* 11.1*  HCT 34.3* 34.3*  MCV 99.7 98.6  PLT 314 176   Basic Metabolic Panel Recent Labs    02/22/21 0351 02/22/21 0830 02/23/21 0339 02/24/21 0400  NA 135   < > 136 134*  K 4.0   < > 3.7 4.0  CL 100  --  103 102  CO2 22  --  21* 20*  GLUCOSE 132*  --  212* 174*  BUN 7*  --  13 27*  CREATININE 1.71*  --  3.22* 5.38*  CALCIUM 8.6*  --  9.1 9.1  MG 2.6*  --  2.7*  --   PHOS 2.0*  --  2.3*  --    < > = values in this interval not displayed.   Liver Function Tests Recent Labs    02/21/21 1610 02/22/21 0351  ALBUMIN 2.4* 2.5*   No results for input(s): LIPASE, AMYLASE in the last 72 hours. Cardiac Enzymes No results for input(s): CKTOTAL, CKMB, CKMBINDEX, TROPONINI in the  last 72 hours.  BNP: BNP (last 3 results) Recent Labs    06/08/20 1011  BNP 402.0*    ProBNP (last 3 results) No results for input(s): PROBNP in the last 8760 hours.   D-Dimer No results for input(s): DDIMER in the last 72 hours. Hemoglobin A1C No results for input(s): HGBA1C in the last 72 hours.  Fasting Lipid Panel No results for input(s): CHOL, HDL, LDLCALC, TRIG, CHOLHDL, LDLDIRECT in the last 72 hours.  Thyroid Function Tests No results for input(s): TSH, T4TOTAL, T3FREE, THYROIDAB in the last 72 hours.  Invalid input(s): FREET3   Other results:   Imaging    No results found.   Medications:     Scheduled Medications:  amiodarone  200 mg Oral Daily    arformoterol  15 mcg Nebulization BID   atorvastatin  80 mg Oral QPM   budesonide (PULMICORT) nebulizer solution  0.5 mg Nebulization BID   Chlorhexidine Gluconate Cloth  6 each Topical Q0600   Chlorhexidine Gluconate Cloth  6 each Topical Q0600   docusate  100 mg Oral BID   doxercalciferol  5 mcg Intravenous Q T,Th,Sa-HD   feeding supplement (NEPRO CARB STEADY)  237 mL Oral TID WC   insulin aspart  0-9 Units Subcutaneous Q4H   latanoprost  1 drop Both Eyes QHS   lidocaine  1 patch Transdermal Q24H   midodrine  5 mg Oral TID WC   multivitamin  1 tablet Oral QHS   pantoprazole (PROTONIX) IV  40 mg Intravenous Q24H   polyethylene glycol  17 g Oral Daily   QUEtiapine  25 mg Oral QHS   sodium chloride flush  3 mL Intravenous Q12H   sodium chloride flush  3 mL Intravenous Q12H    Infusions:  sodium chloride Stopped (02/22/21 0739)   sodium chloride     sodium chloride     sodium chloride 10 mL/hr at 02/24/21 0802   cefTRIAXone (ROCEPHIN)  IV     heparin 1,000 Units/hr (02/24/21 0700)   norepinephrine (LEVOPHED) Adult infusion Stopped (02/24/21 0602)    PRN Medications: sodium chloride, sodium chloride, acetaminophen, hydrALAZINE, ipratropium-albuterol, ondansetron **OR** ondansetron (ZOFRAN) IV, senna-docusate, sodium chloride flush   Assessment/Plan   1.  Atrial fibrillation: Known hx paroxysmal AF.  Seen in ED at [facility] the week PTA for AF and spontaneously converted to SR.  Presented 02/17/21 with AF with RVR. Cardioverted in ER.  She was in junctional rhythm after respiratory arrest and intubation with rate upper 40s-50s. Current rhythm appears to be wandering atrial pacemaker, no clear AF. Bradycardiac with rates in 40s.  - Hold amiodarone today. - Continue heparin gtt. Eventually add back eliquis. 2. Acute systolic CHF: Echo in 2/99 with EF 50-55%, mild LVH, grade 2 diastolic dysfunction.  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  [place] 11/23/17 showed significantly elevated right and left heart filling pressures and severe primarily pulmonary venous hypertension. Cardiac output preserved. Echo (10/19) with EF 45-50%, septal-lateral dyssynchrony, RV looked ok.  PYP scan was not suggestive of transthyretin amyloidosis.  Echo in 1/21 with EF 55-60%, normal RV.  RHC/LHC in 4/21 with no significant CAD, normal filling pressures and cardiac output.  Echo this admission shows EF down to 35%.  HS-TnI 385 => 378, likely demand ischemia with hypotension/respiratory arrest.   - NE off then added back this am to maintain MAP. Will discontinue since SBP consistently above 242 mmHg, diastolic BP low. Provided parameters to  add for SBP < 120 or MAP < 60.  - Continue midodrine 5 mg TID - Fall in EF may be stress cardiomyopathy, but would recommend cath given known vascular disease to rule out significant CAD as cause of cardiomyopathy (planned for today).  3. Acute respiratory failure:  Minimally responsive 12/29 and intubated for airway protection.  Extubated 12/30, stable today. Possible aspiration event. Chest x-ray with increasing LLL airspace disease.  - trach aspirate with normal flora, BC negative - Seen by SLP, dysphagia 3 diet recommended - Vancomycin/Zosyn => ceftriaxone per CCM 4. Acute encephalopathy: Head CT and MRI brain without acute process. Bilateral intracranial ICA stenosis (but no significant extracranial disease). EEG with no seizure activity. Delirium seems to be improving.   5. ESRD: Followed by Dr. Hollie Salk as outpatient.  Volume status optimized on CVVH (stopped 1/1).  - transition back to iHD. Next session planned today per renal 6. Pulmonary HTN: Mild PH on 4/21 RHC.  7. Hyperlipidemia: on atorvastatin 80.  8. DM II: A1c 9.1 - Per primary team 9. ID: Elevated WBCs, fever on 12/29. WBCs now trending down. - ?Aspiration PNA.  - Vanc/zosyn switched to ceftriaxone this am  [facility],  PA-C  02/24/2021 8:17 AM  Patient seen with PA, agree with the above note.   She is back on NE 3 this morning, SBP in 150s.  She has a low diastolic BP.  She remains on midodrine 5 mg tid. She is afebrile, vanc/Zosyn for ?aspiration PNA consolidated to ceftriaxone to finish 7 days.   CVP 12-13 today.   HR in 40s today, wandering atrial pacemaker.  She is on amiodarone 200 mg daily.   General: NAD, sitting in chair Neck: No JVD, no thyromegaly or thyroid nodule.  Lungs: Clear to auscultation bilaterally with normal respiratory effort. CV: Nondisplaced PMI.  Heart brady, regular S1/S2, no S3/S4, no murmur.  No peripheral edema.   Abdomen: Soft, nontender, no hepatosplenomegaly, no distention.  Skin: Intact without lesions or rashes.  Neurologic: Alert and oriented x 3.  Psych: Normal affect. Extremities: No clubbing or cyanosis.  HEENT: Normal.   Continue abx for LLL PNA, ?aspiration event.  Simplified today to ceftriaxone.    With HR 40s, wandering atrial pacemaker, will stop amiodarone today.  No further atrial fibrillation.  Can transition anticoagulation to Eliquis after cath.    Can wean off NE for SBP > 120 or MAP > 60.  Think we can stop NE today, continue midodrine.   Will need HD after cath.    As above, plan coronary angiography today with drop in EF, discussed risks/benefits with patient and daughter.  They agree to plan.   Loralie Champagne 02/24/2021 9:02 AM

## 2021-02-24 NOTE — H&P (View-Only) (Signed)
Patient ID: Stephanie Woodward, female   DOB: October 28, 1940, 81 y.o.   MRN: [id number]     Advanced Heart Failure Rounding Note  PCP-Cardiologist: Ezzard Standing, MD   Subjective:    12/29: Found unresponsive, no arrhythmias noted. Intubated. Started on NE.  MRI brain without acute process. 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  Had fever to 101F. Started on broad spectrum abx and pan cultures sent.  12/30: CVVH started. Extubated.  Echo reviewed: EF 30-35% range, worse function in septum; mild RV dysfunction, IVC dilated.  01/01: CRRT stopped  Off CRRT. Going for HD today. CVP 13  Weaned off NE overnight but restarted this am d/t difficulty maintaining MAP. However, SBP consistently > 213Y, diastolic BP low.   ? Wandering atrial pacemaker on telemetry this am, rates 40s. No clear AF.  LHC today  Evaluated by SLP, dysphagia 3 diet recommended  Sitting up in chair. No dyspnea. Alert and oriented.  Objective:   Weight Range: 83.5 kg Body mass index is 32.61 kg/m.   Vital Signs:   Temp:  [97.6 F (36.4 C)-98.5 F (36.9 C)] 97.6 F (36.4 C) (01/03 0740) Pulse Rate:  [39-77] 39 (01/03 0800) Resp:  [8-25] 17 (01/03 0800) BP: (106-175)/(25-122) 130/49 (01/03 0800) SpO2:  [67 %-100 %] 100 % (01/03 0800) Arterial Line BP: (59-173)/(26-134) 141/44 (01/03 0800) Weight:  [83.5 kg] 83.5 kg (01/03 0500) Last BM Date: 02/23/21  Weight change: Filed Weights   02/22/21 0545 02/23/21 0600 02/24/21 0500  Weight: 84.1 kg 81.6 kg 83.5 kg    Intake/Output:   Intake/Output Summary (Last 24 hours) at 02/24/2021 0817 Last data filed at 02/24/2021 0700 Gross per 24 hour  Intake 1048.12 ml  Output --  Net 1048.12 ml      Physical Exam    CVP 13 General:  Well appearing. No resp difficulty HEENT: normal, R IJ CVC Neck: supple. no JVD. Carotids 2+ bilat; no bruits. No lymphadenopathy or thryomegaly appreciated. Cor: PMI  nondisplaced. Irregular rhythm, bradycardic. No rubs, gallops or murmurs. Lungs: clear Abdomen: soft, nontender, nondistended. No hepatosplenomegaly. No bruits or masses. Good bowel sounds. Extremities: no cyanosis, clubbing, rash, edema Neuro: alert & orientedx3, cranial nerves grossly intact. moves all 4 extremities w/o difficulty. Affect pleasant    Telemetry   ? Wandering atrial pacemaker, 40s (personally reviewed)  Labs    CBC Recent Labs    02/23/21 0339 02/24/21 0400  WBC 15.5* 14.4*  HGB 10.7* 11.1*  HCT 34.3* 34.3*  MCV 99.7 98.6  PLT 314 865   Basic Metabolic Panel Recent Labs    02/22/21 0351 02/22/21 0830 02/23/21 0339 02/24/21 0400  NA 135   < > 136 134*  K 4.0   < > 3.7 4.0  CL 100  --  103 102  CO2 22  --  21* 20*  GLUCOSE 132*  --  212* 174*  BUN 7*  --  13 27*  CREATININE 1.71*  --  3.22* 5.38*  CALCIUM 8.6*  --  9.1 9.1  MG 2.6*  --  2.7*  --   PHOS 2.0*  --  2.3*  --    < > = values in this interval not displayed.   Liver Function Tests Recent Labs    02/21/21 1610 02/22/21 0351  ALBUMIN 2.4* 2.5*   No results for input(s): LIPASE, AMYLASE in the last 72 hours. Cardiac Enzymes No results for input(s): CKTOTAL, CKMB, CKMBINDEX, TROPONINI in the  last 72 hours.  BNP: BNP (last 3 results) Recent Labs    06/08/20 1011  BNP 402.0*    ProBNP (last 3 results) No results for input(s): PROBNP in the last 8760 hours.   D-Dimer No results for input(s): DDIMER in the last 72 hours. Hemoglobin A1C No results for input(s): HGBA1C in the last 72 hours.  Fasting Lipid Panel No results for input(s): CHOL, HDL, LDLCALC, TRIG, CHOLHDL, LDLDIRECT in the last 72 hours.  Thyroid Function Tests No results for input(s): TSH, T4TOTAL, T3FREE, THYROIDAB in the last 72 hours.  Invalid input(s): FREET3   Other results:   Imaging    No results found.   Medications:     Scheduled Medications:  amiodarone  200 mg Oral Daily    arformoterol  15 mcg Nebulization BID   atorvastatin  80 mg Oral QPM   budesonide (PULMICORT) nebulizer solution  0.5 mg Nebulization BID   Chlorhexidine Gluconate Cloth  6 each Topical Q0600   Chlorhexidine Gluconate Cloth  6 each Topical Q0600   docusate  100 mg Oral BID   doxercalciferol  5 mcg Intravenous Q T,Th,Sa-HD   feeding supplement (NEPRO CARB STEADY)  237 mL Oral TID WC   insulin aspart  0-9 Units Subcutaneous Q4H   latanoprost  1 drop Both Eyes QHS   lidocaine  1 patch Transdermal Q24H   midodrine  5 mg Oral TID WC   multivitamin  1 tablet Oral QHS   pantoprazole (PROTONIX) IV  40 mg Intravenous Q24H   polyethylene glycol  17 g Oral Daily   QUEtiapine  25 mg Oral QHS   sodium chloride flush  3 mL Intravenous Q12H   sodium chloride flush  3 mL Intravenous Q12H    Infusions:  sodium chloride Stopped (02/22/21 0739)   sodium chloride     sodium chloride     sodium chloride 10 mL/hr at 02/24/21 0802   cefTRIAXone (ROCEPHIN)  IV     heparin 1,000 Units/hr (02/24/21 0700)   norepinephrine (LEVOPHED) Adult infusion Stopped (02/24/21 0602)    PRN Medications: sodium chloride, sodium chloride, acetaminophen, hydrALAZINE, ipratropium-albuterol, ondansetron **OR** ondansetron (ZOFRAN) IV, senna-docusate, sodium chloride flush   Assessment/Plan   1.  Atrial fibrillation: Known hx paroxysmal AF.  Seen in ED at [facility] the week PTA for AF and spontaneously converted to SR.  Presented 02/17/21 with AF with RVR. Cardioverted in ER.  She was in junctional rhythm after respiratory arrest and intubation with rate upper 40s-50s. Current rhythm appears to be wandering atrial pacemaker, no clear AF. Bradycardiac with rates in 40s.  - Hold amiodarone today. - Continue heparin gtt. Eventually add back eliquis. 2. Acute systolic CHF: Echo in 9/16 with EF 50-55%, mild LVH, grade 2 diastolic dysfunction.  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  [place] 11/23/17 showed significantly elevated right and left heart filling pressures and severe primarily pulmonary venous hypertension. Cardiac output preserved. Echo (10/19) with EF 45-50%, septal-lateral dyssynchrony, RV looked ok.  PYP scan was not suggestive of transthyretin amyloidosis.  Echo in 1/21 with EF 55-60%, normal RV.  RHC/LHC in 4/21 with no significant CAD, normal filling pressures and cardiac output.  Echo this admission shows EF down to 35%.  HS-TnI 385 => 378, likely demand ischemia with hypotension/respiratory arrest.   - NE off then added back this am to maintain MAP. Will discontinue since SBP consistently above 384 mmHg, diastolic BP low. Provided parameters to  add for SBP < 120 or MAP < 60.  - Continue midodrine 5 mg TID - Fall in EF may be stress cardiomyopathy, but would recommend cath given known vascular disease to rule out significant CAD as cause of cardiomyopathy (planned for today).  3. Acute respiratory failure:  Minimally responsive 12/29 and intubated for airway protection.  Extubated 12/30, stable today. Possible aspiration event. Chest x-ray with increasing LLL airspace disease.  - trach aspirate with normal flora, BC negative - Seen by SLP, dysphagia 3 diet recommended - Vancomycin/Zosyn => ceftriaxone per CCM 4. Acute encephalopathy: Head CT and MRI brain without acute process. Bilateral intracranial ICA stenosis (but no significant extracranial disease). EEG with no seizure activity. Delirium seems to be improving.   5. ESRD: Followed by Dr. Hollie Salk as outpatient.  Volume status optimized on CVVH (stopped 1/1).  - transition back to iHD. Next session planned today per renal 6. Pulmonary HTN: Mild PH on 4/21 RHC.  7. Hyperlipidemia: on atorvastatin 80.  8. DM II: A1c 9.1 - Per primary team 9. ID: Elevated WBCs, fever on 12/29. WBCs now trending down. - ?Aspiration PNA.  - Vanc/zosyn switched to ceftriaxone this am  [facility],  PA-C  02/24/2021 8:17 AM  Patient seen with PA, agree with the above note.   She is back on NE 3 this morning, SBP in 150s.  She has a low diastolic BP.  She remains on midodrine 5 mg tid. She is afebrile, vanc/Zosyn for ?aspiration PNA consolidated to ceftriaxone to finish 7 days.   CVP 12-13 today.   HR in 40s today, wandering atrial pacemaker.  She is on amiodarone 200 mg daily.   General: NAD, sitting in chair Neck: No JVD, no thyromegaly or thyroid nodule.  Lungs: Clear to auscultation bilaterally with normal respiratory effort. CV: Nondisplaced PMI.  Heart brady, regular S1/S2, no S3/S4, no murmur.  No peripheral edema.   Abdomen: Soft, nontender, no hepatosplenomegaly, no distention.  Skin: Intact without lesions or rashes.  Neurologic: Alert and oriented x 3.  Psych: Normal affect. Extremities: No clubbing or cyanosis.  HEENT: Normal.   Continue abx for LLL PNA, ?aspiration event.  Simplified today to ceftriaxone.    With HR 40s, wandering atrial pacemaker, will stop amiodarone today.  No further atrial fibrillation.  Can transition anticoagulation to Eliquis after cath.    Can wean off NE for SBP > 120 or MAP > 60.  Think we can stop NE today, continue midodrine.   Will need HD after cath.    As above, plan coronary angiography today with drop in EF, discussed risks/benefits with patient and daughter.  They agree to plan.   Loralie Champagne 02/24/2021 9:02 AM

## 2021-02-24 NOTE — Progress Notes (Signed)
[place] for Apixaban > IV Heparin > Apixaban  Indication: atrial fibrillation  Allergies  Allergen Reactions   Eggs Or Egg-Derived Products Nausea And Vomiting   Lisinopril Nausea And Vomiting   Penicillins Nausea And Vomiting    Has patient had a PCN reaction causing immediate rash, facial/tongue/throat swelling, SOB or lightheadedness with hypotension: No Has patient had a PCN reaction causing severe rash involving mucus membranes or skin necrosis: No Has patient had a PCN reaction that required hospitalization: No Has patient had a PCN reaction occurring within the last 10 years: No If all of the above answers are "NO", then may proceed with Cephalosporin use.    Other Other (See Comments)    Patient Measurements: Height: 5\' 3"  (160 cm) Weight: 83.5 kg (184 lb 1.4 oz) IBW/kg (Calculated) : 52.4  Vital Signs: Temp: 97.6 F (36.4 C) (01/03 1135) Temp Source: Oral (01/03 1135) BP: 126/23 (01/03 1545) Pulse Rate: 58 (01/03 1454)  Labs: Recent Labs    02/22/21 0351 02/22/21 0830 02/22/21 0830 02/23/21 0339 02/23/21 0835 02/24/21 0400  HGB  --  11.6*   < > 10.7*  --  11.1*  HCT  --  34.0*  --  34.3*  --  34.3*  PLT  --   --   --  314  --  288  APTT 84*  --   --   --  77*  --   HEPARINUNFRC 0.89*  --   --  0.41  --  0.36  CREATININE 1.71*  --   --  3.22*  --  5.38*   < > = values in this interval not displayed.    Estimated Creatinine Clearance: 8.8 mL/min (A) (by C-G formula based on SCr of 5.38 mg/dL (H)).  Medical History: Past Medical History:  Diagnosis Date   Arthritis    Asthma    Atrial fibrillation (HCC)    CHF (congestive heart failure) (HCC)    CKD (chronic kidney disease), stage III (HCC)    COPD (chronic obstructive pulmonary disease) (HCC)    Diabetes mellitus    INSULIN DEPENDENT   Gout    Heart murmur    no issues per pt   History of kidney stones    Hyperlipemia    Hypertension    Psoriasis     Renal disorder    congenital   Single kidney    Sleep apnea    doesn't use the Cpap    Assessment: 81 yr old female presented with Afib with RVR - pt was on apixaban 5 mg PO BID PTA for hx Afib (last dose on 02/18/21 at 2245). Anticoagulation was transitioned to IV heparin in anticipation of procedures.   Pt is S/P cardiac cath today. Heparin was discontinued post cath, and pharmacy is consulted to resume apixaban for Afib at appropriate interval post cath. For cath, pt had sheath inserted into R femoral artery, so will resume apixaban dosing tomorrow, 02/25/21 (AM pharmacist to assess pt and determine what time to resume apixaban tomorrow).  H/H 11.1/34.3, plt 288; per RN, no issues with bleeding observed post cath this afternoon.  Goal of Therapy:  Heparin level 0.3-0.7 units/ml aPTT 66-102 seconds Monitor platelets by anticoagulation protocol: Yes   Plan:  Resume apixaban 5 mg BID tomorrow (AM pharmacist to determine start time) Monitor CBC Monitor for bleeding  Gillermina Hu, PharmD, BCPS, [facility] Clinical Pharmacist 02/24/2021

## 2021-02-24 NOTE — Progress Notes (Signed)
SLP Cancellation Note  Patient Details Name: Stephanie Woodward MRN: [id number] DOB: 1940/05/16   Cancelled treatment:       Reason Eval/Treat Not Completed: Medical issues which prohibited therapy. Pt NPO this am for procedure. Will f/u as able. Per RN, no difficulty taking pills this am.     Osie Bond., M.A. [facility] Pager [phone] Office [phone]  02/24/2021, 8:38 AM

## 2021-02-24 NOTE — Progress Notes (Signed)
Contacted by CSW that pt is for snf placement at d/c. [facility] is a possible option for pt and requesting a MWF appt at [facility]. Spoke to [place] at clinic who reports clinic has a MWF 12:10 arrival/12:25 chair time available. CSW notified of this info and states that snf can accommodate that time. Contacted clinic to make them aware that snf would like that appt and will advise clinic of d/c date once known. Update provided to nephrologist as well. Will assist as needed.  Melven Sartorius Renal Navigator [phone]

## 2021-02-24 NOTE — Progress Notes (Signed)
Site area: rt groin arterial sheath Site Prior to Removal:  Level 0 Pressure Applied For: 20 minutes Manual:   yes Patient Status During Pull:  stable Post Pull Site:  Level 0 Post Pull Instructions Given:  yes Post Pull Pulses Present: rt dp dopplered Dressing Applied:  gauze and tegaderm Bedrest begins @ 8412 Comments:

## 2021-02-24 NOTE — Progress Notes (Signed)
[place] for apixaban>heparin infusion Indication: atrial fibrillation  Allergies  Allergen Reactions   Eggs Or Egg-Derived Products Nausea And Vomiting   Lisinopril Nausea And Vomiting   Penicillins Nausea And Vomiting    Has patient had a PCN reaction causing immediate rash, facial/tongue/throat swelling, SOB or lightheadedness with hypotension: No Has patient had a PCN reaction causing severe rash involving mucus membranes or skin necrosis: No Has patient had a PCN reaction that required hospitalization: No Has patient had a PCN reaction occurring within the last 10 years: No If all of the above answers are "NO", then may proceed with Cephalosporin use.    Other Other (See Comments)    Patient Measurements: Height: 5\' 3"  (160 cm) Weight: 83.5 kg (184 lb 1.4 oz) IBW/kg (Calculated) : 52.4 Heparin Dosing Weight: 72.7 kg   Vital Signs: Temp: 97.6 F (36.4 C) (01/03 0740) Temp Source: Oral (01/03 0740) BP: 130/49 (01/03 0800) Pulse Rate: 39 (01/03 0800)  Labs: Recent Labs    02/22/21 0351 02/22/21 0830 02/22/21 0830 02/23/21 0339 02/23/21 0835 02/24/21 0400  HGB  --  11.6*   < > 10.7*  --  11.1*  HCT  --  34.0*  --  34.3*  --  34.3*  PLT  --   --   --  314  --  288  APTT 84*  --   --   --  77*  --   HEPARINUNFRC 0.89*  --   --  0.41  --  0.36  CREATININE 1.71*  --   --  3.22*  --  5.38*   < > = values in this interval not displayed.     Estimated Creatinine Clearance: 8.8 mL/min (A) (by C-G formula based on SCr of 5.38 mg/dL (H)).   Medical History: Past Medical History:  Diagnosis Date   Arthritis    Asthma    Atrial fibrillation (HCC)    CHF (congestive heart failure) (HCC)    CKD (chronic kidney disease), stage III (HCC)    COPD (chronic obstructive pulmonary disease) (HCC)    Diabetes mellitus    INSULIN DEPENDENT   Gout    Heart murmur    no issues per pt   History of kidney stones    Hyperlipemia     Hypertension    Psoriasis    Renal disorder    congenital   Single kidney    Sleep apnea    doesn't use the Cpap    Medications:  Scheduled:   amiodarone  200 mg Oral Daily   arformoterol  15 mcg Nebulization BID   atorvastatin  80 mg Oral QPM   budesonide (PULMICORT) nebulizer solution  0.5 mg Nebulization BID   Chlorhexidine Gluconate Cloth  6 each Topical Q0600   Chlorhexidine Gluconate Cloth  6 each Topical Q0600   docusate  100 mg Oral BID   doxercalciferol  5 mcg Intravenous Q T,Th,Sa-HD   feeding supplement (NEPRO CARB STEADY)  237 mL Oral TID WC   insulin aspart  0-9 Units Subcutaneous Q4H   latanoprost  1 drop Both Eyes QHS   lidocaine  1 patch Transdermal Q24H   midodrine  5 mg Oral TID WC   multivitamin  1 tablet Oral QHS   pantoprazole (PROTONIX) IV  40 mg Intravenous Q24H   polyethylene glycol  17 g Oral Daily   QUEtiapine  25 mg Oral QHS   sodium chloride flush  3 mL Intravenous Q12H  sodium chloride flush  3 mL Intravenous Q12H    Assessment: 42 yof presenting with Afib RVR - pt on apixaban PTA for hx Afib. Last dose on 12/28@2245 . Anticoagulation transitioned to heparin in anticipation of procedures.   Heparin level is therapeutic at 0.36, CBC stable. Cath planned today.  Goal of Therapy:  Heparin level 0.3-0.7 units/ml aPTT 66-102 seconds Monitor platelets by anticoagulation protocol: Yes   Plan:  Continue IV heparin at 1000 units/hr. Daily heparin level and CBC F/U resuming anticoagulation post-cath   Arrie Senate, PharmD, BCPS, [facility] Clinical Pharmacist [phone] Please check AMION for all [facility] numbers 02/24/2021

## 2021-02-24 NOTE — Telephone Encounter (Signed)
This the Danie Binder, the daughter of Annick Dimaio. Ms. Fontes was hospitalized at [facility] from Dec. 12/17-12/20 for Afib. Her heart reset it self. On 12/27 she experienced Afib while doing dialysis and was taken to [facility]. The shocked her heart and the Afib continued. She was placed on medication and hospitalized. 12/29 @7 :30 am she was found unresponsive. Was sent to ICU & placed on ventilator. Was take off vent on 12/30 & remains in ICU. Will need to reschedule her appointment for 02/25/21

## 2021-02-25 ENCOUNTER — Encounter (HOSPITAL_COMMUNITY): Payer: Self-pay | Admitting: Cardiology

## 2021-02-25 ENCOUNTER — Ambulatory Visit: Payer: HMO | Admitting: Internal Medicine

## 2021-02-25 DIAGNOSIS — I480 Paroxysmal atrial fibrillation: Secondary | ICD-10-CM | POA: Diagnosis not present

## 2021-02-25 DIAGNOSIS — I5033 Acute on chronic diastolic (congestive) heart failure: Secondary | ICD-10-CM | POA: Diagnosis not present

## 2021-02-25 LAB — CBC
HCT: 35.3 % — ABNORMAL LOW (ref 36.0–46.0)
Hemoglobin: 10.8 g/dL — ABNORMAL LOW (ref 12.0–15.0)
MCH: 30.8 pg (ref 26.0–34.0)
MCHC: 30.6 g/dL (ref 30.0–36.0)
MCV: 100.6 fL — ABNORMAL HIGH (ref 80.0–100.0)
Platelets: 275 10*3/uL (ref 150–400)
RBC: 3.51 MIL/uL — ABNORMAL LOW (ref 3.87–5.11)
RDW: 15.1 % (ref 11.5–15.5)
WBC: 14.5 10*3/uL — ABNORMAL HIGH (ref 4.0–10.5)
nRBC: 0.6 % — ABNORMAL HIGH (ref 0.0–0.2)

## 2021-02-25 LAB — BASIC METABOLIC PANEL
Anion gap: 13 (ref 5–15)
BUN: 35 mg/dL — ABNORMAL HIGH (ref 8–23)
CO2: 19 mmol/L — ABNORMAL LOW (ref 22–32)
Calcium: 9.5 mg/dL (ref 8.9–10.3)
Chloride: 103 mmol/L (ref 98–111)
Creatinine, Ser: 7.33 mg/dL — ABNORMAL HIGH (ref 0.44–1.00)
GFR, Estimated: 5 mL/min — ABNORMAL LOW (ref >60)
Glucose, Bld: 142 mg/dL — ABNORMAL HIGH (ref 70–99)
Potassium: 4.5 mmol/L (ref 3.5–5.1)
Sodium: 135 mmol/L (ref 135–145)

## 2021-02-25 LAB — GLUCOSE, CAPILLARY
Glucose-Capillary: 123 mg/dL — ABNORMAL HIGH (ref 70–99)
Glucose-Capillary: 136 mg/dL — ABNORMAL HIGH (ref 70–99)
Glucose-Capillary: 224 mg/dL — ABNORMAL HIGH (ref 70–99)
Glucose-Capillary: 187 mg/dL — ABNORMAL HIGH (ref 70–99)
Glucose-Capillary: 157 mg/dL — ABNORMAL HIGH (ref 70–99)
Glucose-Capillary: 185 mg/dL — ABNORMAL HIGH (ref 70–99)
Glucose-Capillary: 190 mg/dL — ABNORMAL HIGH (ref 70–99)

## 2021-02-25 LAB — PHOSPHORUS: Phosphorus: 4.1 mg/dL (ref 2.5–4.6)

## 2021-02-25 LAB — HEPARIN LEVEL (UNFRACTIONATED): Heparin Unfractionated: <0.1 IU/mL — ABNORMAL LOW (ref 0.30–0.70)

## 2021-02-25 MED ORDER — AMIODARONE HCL 200 MG PO TABS
100.0000 mg | ORAL_TABLET | Freq: Every day | ORAL | Status: DC
  Administered 2021-02-25 – 2021-02-26 (×2): 100 mg via ORAL
  Filled 2021-02-25 (×2): qty 1

## 2021-02-25 MED ORDER — ALTEPLASE 2 MG IJ SOLR
2.0000 mg | Freq: Once | INTRAMUSCULAR | Status: DC | PRN
  Filled 2021-02-25: qty 2

## 2021-02-25 MED ORDER — SODIUM CHLORIDE 0.9 % IV SOLN: 100.0000 mL | INTRAVENOUS | Status: DC | PRN

## 2021-02-25 MED ORDER — LIDOCAINE HCL (PF) 1 % IJ SOLN: 5.0000 mL | INTRAMUSCULAR | Status: DC | PRN

## 2021-02-25 MED ORDER — PANTOPRAZOLE SODIUM 20 MG PO TBEC
20.0000 mg | DELAYED_RELEASE_TABLET | Freq: Two times a day (BID) | ORAL | Status: DC
  Administered 2021-02-25 – 2021-03-05 (×15): 20 mg via ORAL
  Filled 2021-02-25 (×16): qty 1

## 2021-02-25 MED ORDER — PENTAFLUOROPROP-TETRAFLUOROETH EX AERO: 1.0000 "application " | INHALATION_SPRAY | CUTANEOUS | Status: DC | PRN

## 2021-02-25 MED ORDER — HEPARIN SODIUM (PORCINE) 1000 UNIT/ML DIALYSIS
1000.0000 [IU] | INTRAMUSCULAR | Status: DC | PRN
  Filled 2021-02-25: qty 1

## 2021-02-25 MED ORDER — MOMETASONE FURO-FORMOTEROL FUM 200-5 MCG/ACT IN AERO
2.0000 | INHALATION_SPRAY | Freq: Two times a day (BID) | RESPIRATORY_TRACT | Status: DC
  Administered 2021-02-25 – 2021-03-05 (×14): 2 via RESPIRATORY_TRACT
  Filled 2021-02-25: qty 8.8

## 2021-02-25 MED ORDER — MIDODRINE HCL 5 MG PO TABS
10.0000 mg | ORAL_TABLET | Freq: Three times a day (TID) | ORAL | Status: DC
  Administered 2021-02-25 (×3): 10 mg via ORAL
  Filled 2021-02-25 (×4): qty 2

## 2021-02-25 MED ORDER — NOREPINEPHRINE 4 MG/250ML-% IV SOLN
INTRAVENOUS | Status: AC
  Filled 2021-02-25: qty 250

## 2021-02-25 MED ORDER — APIXABAN 5 MG PO TABS
5.0000 mg | ORAL_TABLET | Freq: Two times a day (BID) | ORAL | Status: DC
  Administered 2021-02-25 – 2021-03-05 (×17): 5 mg via ORAL
  Filled 2021-02-25 (×17): qty 1

## 2021-02-25 MED ORDER — LIDOCAINE-PRILOCAINE 2.5-2.5 % EX CREA
1.0000 "application " | TOPICAL_CREAM | CUTANEOUS | Status: DC | PRN
  Filled 2021-02-25: qty 5

## 2021-02-25 MED ORDER — DOXERCALCIFEROL 4 MCG/2ML IV SOLN
5.0000 ug | INTRAVENOUS | Status: DC
  Administered 2021-02-25 – 2021-03-04 (×5): 5 ug via INTRAVENOUS
  Filled 2021-02-25 (×7): qty 4

## 2021-02-25 NOTE — Progress Notes (Signed)
[facility] Progress Note   Subjective:    HD tx transitioned to today due to staffing as well as patient's new outpt schedule of MWF for [facility] (MWF schedule requested per her SNF).  She has been in the ICU.  off CRRT 1/1 am.  She has been back on levo at 4 mcg/min for soft pressures overnight per RN. Labs about to be drawn  Review of systems:   Denies shortness of breath  Denies chest pain Denies n/v  Objective Vitals:   02/25/21 0300 02/25/21 0400 02/25/21 0415 02/25/21 0430  BP: (!) 155/31 (!) 120/34 (!) 129/36 (!) 166/40  Pulse: 66 (!) 46 (!) 46 (!) 55  Resp: (!) 9 19 19 13   Temp:      TempSrc:      SpO2: 97% 97% 98% 97%  Weight:      Height:       Physical Exam  General: elderly female in bed in NAD HEENT NCAT Neck: supple trachea midline Heart: Irregular; S1S2 no rub Lungs: Clear to auscultation; unlabored on room air  Abdomen: soft/nt/obese habitus Extremities: No edema lower extremities Psych normal mood and affect Neuro -alert and oriented x 3 provides hx and follows commands Dialysis Access: LUE AVF with bruit and thrill; RIJ nontunneled dialysis catheter  OP HD:  [place] [facility]     4h  350/500  87.2kg  2/2 bath  P2  Hep none   - hect 5 ug tiw   - venofer 50 q wk  - no esa     CXR 12/27 - IMPRESSION: Cardiomegaly with mild pulmonary interstitial edema and trace bilateral pleural effusions suggesting CHF.    CXR 12/29 - IMPRESSION: Cardiomegaly, pulmonary vascular congestion and increased hazy opacities since previous study which may represent increasing edema/infiltrate.  Assessment/Plan: ESRD - CRRT started 12/30 and stopped 1/1.  For HD on 02/25/21 per her new outpatient MWF schedule.  Retain nontunneled HD catheter for now. Await am labs.  AMS - delirium. Neuro consulted. improved Acute resp insufficiency - extubated 12/30. +fever/ ^wbc/ pulm infiltrates. S/p broad spectrum IV abx w/ vanc/ zosyn. Extra volume removed w/ CRRT with CXR and exam  improved (3-4 kg under dry weight and CVP low after that).  Optimize volume with HD Hypotension - on levo.  She is on midodrine 5 mg TID - increase to 10 mg TID Paroxysmal Afib - cardioverted in ED 12/27.  po amio and IV heparin per primary team Anemia of CKD- Hb acceptable; defer ESA For now MBD ckd - Cont hectorol here. No binders on home med list and phos slightly low last check. Await updated phos   Additional Objective Labs: Basic Metabolic Panel: CBC: Recent Labs  Lab 02/19/21 0335 02/19/21 1052 02/19/21 1436 02/20/21 1009 02/21/21 0401 02/22/21 0830 02/23/21 0339 02/24/21 0400  WBC 14.7* 13.0*  --  16.6* 16.4*  --  15.5* 14.4*  NEUTROABS 12.0* 11.6*  --   --   --   --   --   --   HGB 11.0* 10.9*   < > 12.0 10.5* 11.6* 10.7* 11.1*  HCT 36.3 33.6*   < > 36.3 32.7* 34.0* 34.3* 34.3*  MCV 100.8* 96.6  --  94.8 95.3  --  99.7 98.6  PLT 338 271  --  294 288  --  314 288   < > = values in this interval not displayed.   Recent Labs  Lab 02/24/21 0734 02/24/21 1134 02/24/21 1523 02/24/21 2005 02/25/21 0205  GLUCAP 151* 143* 105* 110* 123*   Iron Studies: No results for input(s): IRON, TIBC, TRANSFERRIN, FERRITIN in the last 72 hours. Lab Results  Component Value Date   INR 1.1 11/17/2018   Medications:  sodium chloride 10 mL/hr at 02/25/21 0400   sodium chloride     sodium chloride 10 mL/hr at 02/25/21 0400   cefTRIAXone (ROCEPHIN)  IV Stopped (02/24/21 1850)   norepinephrine (LEVOPHED) Adult infusion 3 mcg/min (02/25/21 0400)    arformoterol  15 mcg Nebulization BID   atorvastatin  80 mg Oral QPM   budesonide (PULMICORT) nebulizer solution  0.5 mg Nebulization BID   Chlorhexidine Gluconate Cloth  6 each Topical Q0600   doxercalciferol  5 mcg Intravenous Q T,Th,Sa-HD   feeding supplement (NEPRO CARB STEADY)  237 mL Oral TID WC   insulin aspart  0-9 Units Subcutaneous Q4H   latanoprost  1 drop Both Eyes QHS   lidocaine  1 patch Transdermal Q24H   midodrine  5  mg Oral TID WC   multivitamin  1 tablet Oral QHS   pantoprazole (PROTONIX) IV  40 mg Intravenous Q24H   QUEtiapine  25 mg Oral QHS   sodium chloride flush  3 mL Intravenous Q12H   sodium chloride flush  3 mL Intravenous Q12H   sodium chloride flush  3 mL Intravenous Q12H    Claudia Desanctis, MD 02/25/2021 6:09 AM

## 2021-02-25 NOTE — Progress Notes (Signed)
NAME:  Stephanie Woodward, MRN:  [id number], DOB:  1940/09/10, LOS: 6 ADMISSION DATE:  02/17/2021, CONSULTATION DATE:  02/19/2021 REFERRING MD:  Dr. Olevia Bowens, CHIEF COMPLAINT:  AMS   History of Present Illness:   81 year old female with prior history of COPD, mild pulmonary hypertension, ESRD TTS, PAF on Eliquis, HFpEF, IDDM DMT2, and obesity who presented on 1227 from dialysis with shortness of breath and palpitations.  She did not receive full dialysis treatment, last full treatment on 12/24 at her EDW.  Prior hospitalization 1 week ago at [facility] found to be in A. fib with RVR and placed on Eliquis at that time.  Plans for DCCV however she self converted.  No reports of prior fever, cough, nausea, or vomiting.    Found in ER afebrile, normotensive, and normal oxygenation but in A. fib with RVR in which cardiology was consulted and she was cardioverted back to normal sinus.  She was admitted to [facility], nephrology consulted and underwent iHD on 12/28.  She did have some brief hypotension at the end of iHD but recovered.  Last seen at baseline around 0300 on 12/29.  Around 0730, she was found to be minimally unresponsive with concern for airway protection.  Rapid response at bedside, glucose 206, and no new or sedating meds other than her home remeron last night.  She remains in NSR and normotensive with good O2 saturations.  She was placed on BIPAP, ABG showing 7.283/ 60/ 92/ 27.5.  PCCM consulted and patient transferred to ICU.    Of note, speaking with patient's two daughters, they report patient has been having episodes with difficulty swallowing/ choking episodes at least for two weeks and patient complained of hand tremors 12/28.   Pertinent  Medical History  COPD, former smoker (quit 15 yrs ago) mild pulmonary hypertension, ESRD, PAF on Eliquis, HFpEF, IDDM DMT2, obesity  Significant Hospital Events: Including procedures, antibiotic start and stop dates in addition to other pertinent  events   12/27 Admitted [place], cards/ nephrology consulted, cardioverted,  12/28 iHD 12/29 unresponsive, LNW 0300, intubated for airway protection, CT, MRI., EEG unremarkable  12/30 extubate 1/3 Intermittently on and off levo. S/p LHC no obstructive CAD. No explanation for cardiomyopathy, suspect stress induced  Interim History / Subjective:  S/p LHC yesterday. Briefly dizzy and hypotensive post-procedure  Objective   Blood pressure (!) 142/47, pulse 68, temperature 97.9 F (36.6 C), temperature source Oral, resp. rate (!) 22, height 5\' 3"  (1.6 m), weight 85.3 kg, SpO2 98 %. CVP:  [7 mmHg-22 mmHg] 11 mmHg      Intake/Output Summary (Last 24 hours) at 02/25/2021 0908 Last data filed at 02/25/2021 0800 Gross per 24 hour  Intake 404.61 ml  Output --  Net 404.61 ml   Filed Weights   02/23/21 0600 02/24/21 0500 02/25/21 0630  Weight: 81.6 kg 83.5 kg 85.3 kg   Physical Exam: General: Elderly-appearing, no acute distress HENT: [place], AT, OP clear, MMM Eyes: EOMI, no scleral icterus Respiratory: Clear to auscultation bilaterally.  No crackles, wheezing or rales Cardiovascular: RRR, -M/R/G, no JVD GI: BS+, soft, nontender Extremities:-Edema,-tenderness Neuro: AAO x4, CNII-XII grossly intact Lines: Right perm cath  Stable leukocytosis CO2 19 Increased BUN/Cr  Resolved Hospital Problem list   Acute respiratory failure secondary to pneumonia Assessment & Plan:   Transient coma 12/29 of unclear etiology question exaggerated CO2 narcosis- resolved.  MRI/CT/EEG benign. ICU delirium- expected given events above, improving.  - Continue seroquel nightly - Re-orient day and night  Respiratory failure  due to inability to protect airway improved now off vent, extubated 12/30. Resolved. Currently on RA.  LLL Pna- question aspiration during unresponsive event above Trach aspiration - normal flora - Completed Ceftriaxone for 7 days total - Transition from nebs to [facility] 200 BID  Ongoing  shock- mostly seems distributive now, off dobutamine. Intermittently on low dose levophed.  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  Best Practice (right click and "Reselect all SmartList Selections" daily)   Diet/type: modified dysphage DVT prophylaxis: apixaban GI prophylaxis: PPI Lines: Dialysis Catheter and Arterial Line Foley:  N/A Code Status:  full code Last date of multidisciplinary goals of care discussion: 1/4 daughter updated at bedside, hope for full recovery  The patient is critically ill with hypotension and requires high complexity decision making for assessment and support, frequent evaluation and titration of therapies, application of advanced monitoring technologies and extensive interpretation of multiple databases.  Independent Critical Care Time: 32 Minutes.   Rodman Pickle, M.D. [facility] Pulmonary/Critical Care Medicine 02/25/2021 9:44 AM   Please see Amion for pager number to reach on-call Pulmonary and Critical Care Team.

## 2021-02-25 NOTE — Progress Notes (Signed)
Patient ID: Stephanie Woodward, female   DOB: 04/18/1940, 81 y.o.   MRN: [id number]     Advanced Heart Failure Rounding Note  PCP-Cardiologist: Ezzard Standing, MD   Subjective:    12/29: Found unresponsive, no arrhythmias noted. Intubated. Started on NE.  MRI brain without acute process. 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  Had fever to 101F. Started on broad spectrum abx and pan cultures sent.  12/30: CVVH started. Extubated.  Echo reviewed: EF 30-35% range, worse function in septum; mild RV dysfunction, IVC dilated.  01/01: CRRT stopped 01/03: LHC showed no obstructive CAD. 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   Off CRRT. Needs HD today.   Restarted on NE overnight, stopped again this morning. SBP consistently > 629U, diastolic BP low. Has been on midodrine 5 tid.   She is in NSR in 60s.  Off amiodarone.   Sitting up in chair. No dyspnea. Alert and oriented.  Objective:   Weight Range: 85.3 kg Body mass index is 33.31 kg/m.   Vital Signs:   Temp:  [97.6 F (36.4 C)-97.9 F (36.6 C)] 97.9 F (36.6 C) (01/04 0735) Pulse Rate:  [39-79] 68 (01/04 0841) Resp:  [0-26] 22 (01/04 0841) BP: (101-174)/(18-118) 142/47 (01/04 0841) SpO2:  [0 %-100 %] 98 % (01/04 0841) Arterial Line BP: (54-173)/(34-108) 131/36 (01/04 0815) Weight:  [85.3 kg] 85.3 kg (01/04 0630) Last BM Date: 02/25/21  Weight change: Filed Weights   02/23/21 0600 02/24/21 0500 02/25/21 0630  Weight: 81.6 kg 83.5 kg 85.3 kg    Intake/Output:   Intake/Output Summary (Last 24 hours) at 02/25/2021 0845 Last data filed at 02/25/2021 0800 Gross per 24 hour  Intake 438.62 ml  Output --  Net 438.62 ml      Physical Exam    CVP 13 General:  Well appearing. No resp difficulty HEENT: normal, R IJ  CVC Neck: supple. no JVD. Carotids 2+ bilat; no bruits. No lymphadenopathy or thryomegaly appreciated. Cor: PMI nondisplaced. Irregular rhythm, bradycardic. No rubs, gallops or murmurs. Lungs: clear Abdomen: soft, nontender, nondistended. No hepatosplenomegaly. No bruits or masses. Good bowel sounds. Extremities: no cyanosis, clubbing, rash, edema Neuro: alert & orientedx3, cranial nerves grossly intact. moves all 4 extremities w/o difficulty. Affect pleasant    Telemetry   ? Wandering atrial pacemaker, 40s (personally reviewed)  Labs    CBC Recent Labs    02/24/21 0400 02/25/21 0608  WBC 14.4* 14.5*  HGB 11.1* 10.8*  HCT 34.3* 35.3*  MCV 98.6 100.6*  PLT 288 765   Basic Metabolic Panel Recent Labs    02/23/21 0339 02/24/21 0400 02/25/21 0608  NA 136 134* 135  K 3.7 4.0 4.5  CL 103 102 103  CO2 21* 20* 19*  GLUCOSE 212* 174* 142*  BUN 13 27* 35*  CREATININE 3.22* 5.38* 7.33*  CALCIUM 9.1 9.1 9.5  MG 2.7*  --   --   PHOS 2.3*  --  4.1   Liver Function Tests No results for input(s): AST, ALT, ALKPHOS, BILITOT, PROT, ALBUMIN in the last 72 hours.  No results for input(s): LIPASE, AMYLASE in the last 72 hours. Cardiac Enzymes No results for input(s): CKTOTAL, CKMB, CKMBINDEX, TROPONINI in the last 72 hours.  BNP: BNP (last 3 results) Recent Labs    06/08/20 1011  BNP 402.0*    ProBNP (last 3  results) No results for input(s): PROBNP in the last 8760 hours.   D-Dimer No results for input(s): DDIMER in the last 72 hours. Hemoglobin A1C No results for input(s): HGBA1C in the last 72 hours.  Fasting Lipid Panel No results for input(s): CHOL, HDL, LDLCALC, TRIG, CHOLHDL, LDLDIRECT in the last 72 hours.  Thyroid Function Tests No results for input(s): TSH, T4TOTAL, T3FREE, THYROIDAB in the last 72 hours.  Invalid input(s): FREET3   Other results:   Imaging    CARDIAC CATHETERIZATION  Result Date: 02/24/2021 Unusual coronary tree with what  appears to be a congenitally small LAD and large, super-dominant RCA.  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 Will consider future coronary CTA to make sure we are not missing a coronary anomaly.     Medications:     Scheduled Medications:  amiodarone  100 mg Oral Daily   apixaban  5 mg Oral BID   arformoterol  15 mcg Nebulization BID   atorvastatin  80 mg Oral QPM   budesonide (PULMICORT) nebulizer solution  0.5 mg Nebulization BID   Chlorhexidine Gluconate Cloth  6 each Topical Q0600   doxercalciferol  5 mcg Intravenous Q T,Th,Sa-HD   feeding supplement (NEPRO CARB STEADY)  237 mL Oral TID WC   insulin aspart  0-9 Units Subcutaneous Q4H   latanoprost  1 drop Both Eyes QHS   lidocaine  1 patch Transdermal Q24H   midodrine  10 mg Oral TID WC   multivitamin  1 tablet Oral QHS   pantoprazole (PROTONIX) IV  40 mg Intravenous Q24H   QUEtiapine  25 mg Oral QHS   sodium chloride flush  3 mL Intravenous Q12H   sodium chloride flush  3 mL Intravenous Q12H   sodium chloride flush  3 mL Intravenous Q12H    Infusions:  sodium chloride 10 mL/hr at 02/25/21 0800   sodium chloride     sodium chloride 10 mL/hr at 02/25/21 0800   cefTRIAXone (ROCEPHIN)  IV Stopped (02/24/21 1850)   norepinephrine (LEVOPHED) Adult infusion 2 mcg/min (02/25/21 0800)   norepinephrine      PRN Medications: sodium chloride, sodium chloride, sodium chloride, acetaminophen, hydrALAZINE, ipratropium-albuterol, ondansetron (ZOFRAN) IV, ondansetron **OR** [DISCONTINUED] ondansetron (ZOFRAN) IV, senna-docusate, sodium chloride flush   Assessment/Plan   1.  Atrial fibrillation: Known hx paroxysmal AF.  Seen in ED at [facility] the week PTA for AF and spontaneously converted to SR.  Presented 02/17/21 with AF with RVR. Cardioverted in ER.  She was in junctional rhythm after respiratory arrest and intubation with rate upper 40s-50s. Rhythm on 1/3 appeared to be wandering  atrial pacemaker, and amiodarone stopped.  This morning, NSR in 60s.  - Restart amiodarone 100 mg daily.  - Continue Eliquis.  2. Acute systolic CHF: Echo in 9/74 with EF 50-55%, mild LVH, grade 2 diastolic dysfunction.  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  [place] 11/23/17 showed significantly elevated right and left heart filling pressures and severe primarily pulmonary venous hypertension. Cardiac output preserved. Echo (10/19) with EF 45-50%, septal-lateral dyssynchrony, RV looked ok.  PYP scan was not suggestive of transthyretin amyloidosis.  Echo in 1/21 with EF 55-60%, normal RV.  RHC/LHC in 4/21 with no significant CAD, normal filling pressures and cardiac output.  Echo this admission showed EF down to 35%.  HS-TnI 385 => 378, likely demand ischemia with hypotension/respiratory arrest.  LHC showed no obstructive CAD, Unusual coronary tree with what appears to  be a congenitally small LAD and large, super-dominant RCA.  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 Was on NE overnight, now off.  - Leave off NE as long as SBP > 120 (runs low diastolic pressure).  - Continue midodrine, can increase to 10 tid.  - Would like eventual coronary CT to make sure we are not missing an anomalous vessel with unusual coronary tree on cath.  3. Acute respiratory failure:  Minimally responsive 12/29 and intubated for airway protection.  Extubated 12/30, stable today. Possible aspiration event with baseline OHS. Chest x-ray with increasing LLL airspace disease. Trach aspirate with normal flora, BC negative.  Seen by SLP, dysphagia 3 diet recommended.  - Continue ceftriaxone per CCM 4. Acute encephalopathy: Head CT and MRI brain without acute process. Bilateral intracranial ICA stenosis (but no significant extracranial disease). EEG with no seizure activity. Delirium seems to be improving.   5. ESRD: Followed by Dr.  Hollie Salk as outpatient.  Volume status optimized on CVVH (stopped 1/1).  - Transition back to iHD. Next session planned today per renal 6. Pulmonary HTN: Mild PH on 4/21 RHC.  7. Hyperlipidemia: on atorvastatin 80.  8. DM II: A1c 9.1 - Per primary team 9. ID: Elevated WBCs, fever on 12/29. WBCs now trending down. ?Aspiration PNA.  - Vanc/zosyn switched to ceftriaxone.   Loralie Champagne 02/25/2021 8:45 AM

## 2021-02-25 NOTE — Progress Notes (Signed)
Occupational Therapy Treatment Patient Details Name: Stephanie Woodward MRN: [id number] DOB: 02-05-1941 Today's Date: 02/25/2021   History of present illness Pt adm 12/27 with SOB and found to be in afib with RVR. On 12/28 pt found unresponsive and transferred to ICU and intubated.  Pt placed on CRRT on 12/29. Head CT and MRI negative. Extubated 12/30. PMH - ESRD on HD, afib, chf, DM, copd, gout, chronic back pain   OT comments  Pt progressing well towards OT goals, able to initiate mobility using Rollator in collaboration with PT today though continues to benefit from +2 assist for standing/safety in progression. Daughter present and very supportive, interested in activities they can complete outside of therapy session as B hand weakness/coordination still causing some difficulty completing ADLs. Provided education/supplies  re: built up grips for utensils, squeeze ball for grip strength, fine motor activities, theraputty and HEP handouts. DC recs remain appropriate.   Recommendations for follow up therapy are one component of a multi-disciplinary discharge planning process, led by the attending physician.  Recommendations may be updated based on patient status, additional functional criteria and insurance authorization.    Follow Up Recommendations  Skilled nursing-short term rehab (<3 hours/day)    Assistance Recommended at Discharge Intermittent Supervision/Assistance  Patient can return home with the following  A lot of help with walking and/or transfers;A lot of help with bathing/dressing/bathroom;Assistance with cooking/housework   Equipment Recommendations  None recommended by OT    Recommendations for Other Services      Precautions / Restrictions Precautions Precautions: Fall Precaution Comments: A line Restrictions Weight Bearing Restrictions: No       Mobility Bed Mobility               General bed mobility comments: up in chair    Transfers Overall transfer  level: Needs assistance Equipment used: Rollator (4 wheels) Transfers: Sit to/from Stand Sit to Stand: Mod assist;+2 physical assistance;+2 safety/equipment           General transfer comment: Mod A x 2 for various sit to stands during peri care and mobility. cues for rollator brake mgmt with some assist needed due to hand weakness     Balance Overall balance assessment: Needs assistance Sitting-balance support: No upper extremity supported;Feet unsupported Sitting balance-Leahy Scale: Fair     Standing balance support: Bilateral upper extremity supported Standing balance-Leahy Scale: Poor Standing balance comment: reliant on UE support in standing                           ADL either performed or assessed with clinical judgement   ADL Overall ADL's : Needs assistance/impaired   Eating/Feeding Details (indicate cue type and reason): daughter reports pt with difficulty holding items; provided built up grips to trial, recommended trial of half cup of liquids rather than full cup as this is heavier and may be more difficult to hold Grooming: Minimal assistance;Standing;Oral care Grooming Details (indicate cue type and reason): able to open toothpaste though slow manipulation of toothbrush in hand; used L hand for task though R hand dominant                     Toileting- Clothing Manipulation and Hygiene: Total assistance;Sit to/from stand Toileting - Clothing Manipulation Details (indicate cue type and reason): for peri care after bowel incontinence       General ADL Comments: Reports improved B hand use though still with deficits. Daughter present/supportive asking for activities  they can complete independently. Provided built up grips for utenstis though encouraged continued trial without. Provided squeeze ball, theraputty and coordination exercises handout. Encouraged reaching, stretching of R shoulder though <90* due to lines in neck    Extremity/Trunk  Assessment Upper Extremity Assessment Upper Extremity Assessment: RUE deficits/detail;LUE deficits/detail RUE Deficits / Details: impaired shoulder flexion at baseline, can lift to 45*,  slow manipulation of objects RUE Coordination: decreased fine motor;decreased gross motor LUE Deficits / Details: weakness, slow manipulation of objects LUE Coordination: decreased fine motor   Lower Extremity Assessment Lower Extremity Assessment: Defer to PT evaluation        Vision   Vision Assessment?: No apparent visual deficits   Perception     Praxis      Cognition Arousal/Alertness: Awake/alert Behavior During Therapy: Flat affect Overall Cognitive Status: Impaired/Different from baseline Area of Impairment: Following commands;Safety/judgement;Awareness;Problem solving                       Following Commands: Follows one step commands with increased time;Follows one step commands consistently Safety/Judgement: Decreased awareness of deficits Awareness: Emergent Problem Solving: Slow processing;Requires verbal cues General Comments: Pleasant, appropriate responses and motivated to participate. Increased time to follow directions and intermittent cues for safet DME use/sequencing          Exercises     Shoulder Instructions       General Comments VSS    Pertinent Vitals/ Pain       Pain Assessment: Faces Faces Pain Scale: Hurts little more Pain Location: chronic back pain Pain Descriptors / Indicators: Grimacing;Sore Pain Intervention(s): Monitored during session;Limited activity within patient's tolerance;Repositioned  Home Living                                          Prior Functioning/Environment              Frequency  Min 2X/week        Progress Toward Goals  OT Goals(current goals can now be found in the care plan section)  Progress towards OT goals: Progressing toward goals  Acute Rehab OT Goals Patient Stated Goal:  regain independence OT Goal Formulation: With patient Time For Goal Achievement: 03/07/21 Potential to Achieve Goals: Good ADL Goals Pt Will Perform Grooming: with supervision;sitting Pt Will Transfer to Toilet: with min assist;bedside commode;stand pivot transfer Additional ADL Goal #1: pt will follow 2 step command 50% of session Additional ADL Goal #2: pt will complete bed mobility mod (A) as precursor to adls  Plan Discharge plan remains appropriate    Co-evaluation    PT/OT/SLP Co-Evaluation/Treatment: Yes Reason for Co-Treatment: For patient/therapist safety;To address functional/ADL transfers   OT goals addressed during session: ADL's and self-care      AM-PAC OT "6 Clicks" Daily Activity     Outcome Measure   Help from another person eating meals?: A Little Help from another person taking care of personal grooming?: A Little Help from another person toileting, which includes using toliet, bedpan, or urinal?: Total Help from another person bathing (including washing, rinsing, drying)?: A Lot Help from another person to put on and taking off regular upper body clothing?: A Lot Help from another person to put on and taking off regular lower body clothing?: A Lot 6 Click Score: 13    End of Session Equipment Utilized During Treatment: Gait belt;Rollator (4 wheels)  OT Visit Diagnosis: Unsteadiness on feet (R26.81);Muscle weakness (generalized) (M62.81)   Activity Tolerance Patient tolerated treatment well   Patient Left in chair;with call bell/phone within reach;with family/visitor present   Nurse Communication          Time: 6283-6629 OT Time Calculation (min): 39 min  Charges: OT General Charges $OT Visit: 1 Visit OT Treatments $Self Care/Home Management : 8-22 mins  Malachy Chamber, OTR/L Acute Rehab Services Office: [phone]   Layla Maw 02/25/2021, 10:56 AM

## 2021-02-25 NOTE — Progress Notes (Signed)
Speech Language Pathology Treatment: Dysphagia  Patient Details Name: Stephanie Woodward MRN: [id number] DOB: Feb 14, 1941 Today's Date: 02/25/2021 Time: 3546-5681 SLP Time Calculation (min) (ACUTE ONLY): 13 min  Assessment / Plan / Recommendation Clinical Impression  Pt was seen this afternoon after being advanced back to mechanical soft diet. She says that she enjoys these textures, and that she believes she is having less coughing on softer foods than she was PTA. As a result, she enjoys them and would like to stay on current diet. SLP provided trials of regular solids and thin liquids with one delayed cough noted, suspicious for possible esophageal etiology as pt described food feeling stuck in her upper chest prior to coughing. Cough was productive of mucus without evidence of food or drink though. Would continue current diet with use of general aspiration and esophageal precautions.    HPI HPI: Pt adm 12/27 with SOB and found to be in afib with RVR. On 12/28 pt found unresponsive and transferred to ICU and intubated.  Pt placed on CRRT on 12/29. Head CT and MRI negative. Extubated 12/30. PMH - ESRD on HD, afib, chf, DM, copd, gout. Daughters report some coughing with meals PTA>      SLP Plan  Continue with current plan of care      Recommendations for follow up therapy are one component of a multi-disciplinary discharge planning process, led by the attending physician.  Recommendations may be updated based on patient status, additional functional criteria and insurance authorization.    Recommendations  Diet recommendations: Dysphagia 3 (mechanical soft);Thin liquid Liquids provided via: Cup;Straw Medication Administration: Whole meds with puree Supervision: Staff to assist with self feeding Compensations: Minimize environmental distractions;Slow rate;Small sips/bites;Follow solids with liquid Postural Changes and/or Swallow Maneuvers: Seated upright 90 degrees;Upright 30-60 min after meal                 Oral Care Recommendations: Oral care BID Follow Up Recommendations: Skilled nursing-short term rehab (<3 hours/day) Assistance recommended at discharge: Intermittent Supervision/Assistance SLP Visit Diagnosis: Dysphagia, unspecified (R13.10) Plan: Continue with current plan of care           Stephanie Woodward., M.A. Stephanie Woodward Acute Rehabilitation Services Pager [phone] Office [phone]  02/25/2021, 4:27 PM

## 2021-02-25 NOTE — Progress Notes (Signed)
Physical Therapy Treatment Patient Details Name: Stephanie Woodward MRN: [id number] DOB: [birth year] Today's Date: 02/25/2021   History of Present Illness Pt adm 12/27 with SOB and found to be in afib with RVR. On 12/28 pt found unresponsive and transferred to ICU and intubated.  Pt placed on CRRT on 12/29. Head CT and MRI negative. Extubated 12/30. PMH - ESRD on HD, afib, chf, DM, copd, gout, chronic back pain    PT Comments    Pt making steady progress with mobility. Able to amb short distances today with assist. Remains very motivated to return to independence.    Recommendations for follow up therapy are one component of a multi-disciplinary discharge planning process, led by the attending physician.  Recommendations may be updated based on patient status, additional functional criteria and insurance authorization.  Follow Up Recommendations  Skilled nursing-short term rehab (<3 hours/day)     Assistance Recommended at Discharge Frequent or constant Supervision/Assistance  Patient can return home with the following     Equipment Recommendations  Wheelchair (measurements PT);Wheelchair cushion (measurements PT)    Recommendations for Other Services       Precautions / Restrictions Precautions Precautions: Fall Precaution Comments: A line Restrictions Weight Bearing Restrictions: No     Mobility  Bed Mobility               General bed mobility comments: up in chair    Transfers Overall transfer level: Needs assistance Equipment used: Rollator (4 wheels) Transfers: Sit to/from Stand Sit to Stand: Mod assist;+2 physical assistance;+2 safety/equipment           General transfer comment: Assist to bring hips up and for balance. Pt slow to rise.    Ambulation/Gait Ambulation/Gait assistance: Min assist;+2 safety/equipment Gait Distance (Feet): 15 Feet (15' x 1, 10' x 1) Assistive device: Rollator (4 wheels) Gait Pattern/deviations: Step-to pattern;Decreased step  length - right;Decreased step length - left;Shuffle;Trunk flexed Gait velocity: decr Gait velocity interpretation: <1.31 ft/sec, indicative of household ambulator   General Gait Details: Assist for balance and support. Pt with flexed posture (baseline flexed posture per dtr due to spinal stenosis) and tendency to keep feet too far back from rollator. With cues pt able to keep feet better positioned.   Stairs             Wheelchair Mobility    Modified Rankin (Stroke Patients Only)       Balance Overall balance assessment: Needs assistance Sitting-balance support: No upper extremity supported;Feet unsupported Sitting balance-Leahy Scale: Fair     Standing balance support: Bilateral upper extremity supported Standing balance-Leahy Scale: Poor Standing balance comment: walker and min guard for static standing                            Cognition Arousal/Alertness: Awake/alert Behavior During Therapy: Flat affect Overall Cognitive Status: Impaired/Different from baseline Area of Impairment: Following commands;Safety/judgement;Awareness;Problem solving                       Following Commands: Follows one step commands with increased time;Follows one step commands consistently Safety/Judgement: Decreased awareness of deficits Awareness: Emergent Problem Solving: Slow processing;Requires verbal cues General Comments: Pleasant, appropriate responses and motivated to participate. Increased time to follow directions and intermittent cues for safet DME use/sequencing        Exercises General Exercises - Lower Extremity Ankle Circles/Pumps: AROM;Both;5 reps;Seated Quad Sets: AROM;5 reps;Both;Seated Long Arc Quad: AROM;Both;5 reps;Seated Other Exercises  Other Exercises: Isometric hip adduction with pillow between knees in sitting    General Comments General comments (skin integrity, edema, etc.): VSS on RA      Pertinent Vitals/Pain Pain  Assessment: Faces Faces Pain Scale: Hurts little more Pain Location: chronic back pain Pain Descriptors / Indicators: Grimacing;Sore Pain Intervention(s): Monitored during session;Repositioned    Home Living                          Prior Function            PT Goals (current goals can now be found in the care plan section) Progress towards PT goals: Progressing toward goals    Frequency    Min 3X/week      PT Plan Current plan remains appropriate    Co-evaluation PT/OT/SLP Co-Evaluation/Treatment: Yes Reason for Co-Treatment: For patient/therapist safety PT goals addressed during session: Mobility/safety with mobility OT goals addressed during session: ADL's and self-care      AM-PAC PT "6 Clicks" Mobility   Outcome Measure  Help needed turning from your back to your side while in a flat bed without using bedrails?: A Lot Help needed moving from lying on your back to sitting on the side of a flat bed without using bedrails?: A Lot Help needed moving to and from a bed to a chair (including a wheelchair)?: A Lot Help needed standing up from a chair using your arms (e.g., wheelchair or bedside chair)?: A Lot Help needed to walk in hospital room?: Total Help needed climbing 3-5 steps with a railing? : Total 6 Click Score: 10    End of Session Equipment Utilized During Treatment: Gait belt Activity Tolerance: Patient tolerated treatment well Patient left: in chair;with call bell/phone within reach;with family/visitor present Nurse Communication: Mobility status PT Visit Diagnosis: Other abnormalities of gait and mobility (R26.89);Muscle weakness (generalized) (M62.81);Difficulty in walking, not elsewhere classified (R26.2)     Time: 1761-6073 PT Time Calculation (min) (ACUTE ONLY): 44 min  Charges:  $Gait Training: 8-22 mins                     [place] Pager [phone] [place] 02/25/2021, 11:28 AM

## 2021-02-26 ENCOUNTER — Other Ambulatory Visit (HOSPITAL_COMMUNITY): Payer: Self-pay

## 2021-02-26 DIAGNOSIS — I959 Hypotension, unspecified: Secondary | ICD-10-CM

## 2021-02-26 DIAGNOSIS — Z992 Dependence on renal dialysis: Secondary | ICD-10-CM | POA: Diagnosis not present

## 2021-02-26 DIAGNOSIS — I480 Paroxysmal atrial fibrillation: Secondary | ICD-10-CM | POA: Diagnosis not present

## 2021-02-26 DIAGNOSIS — N186 End stage renal disease: Secondary | ICD-10-CM | POA: Diagnosis not present

## 2021-02-26 LAB — CBC
HCT: 32.6 % — ABNORMAL LOW (ref 36.0–46.0)
Hemoglobin: 10.6 g/dL — ABNORMAL LOW (ref 12.0–15.0)
MCH: 31 pg (ref 26.0–34.0)
MCHC: 32.5 g/dL (ref 30.0–36.0)
MCV: 95.3 fL (ref 80.0–100.0)
Platelets: 229 10*3/uL (ref 150–400)
RBC: 3.42 MIL/uL — ABNORMAL LOW (ref 3.87–5.11)
RDW: 15 % (ref 11.5–15.5)
WBC: 10.1 10*3/uL (ref 4.0–10.5)
nRBC: 1.2 % — ABNORMAL HIGH (ref 0.0–0.2)

## 2021-02-26 LAB — BASIC METABOLIC PANEL WITH GFR
Anion gap: 8 (ref 5–15)
BUN: 13 mg/dL (ref 8–23)
CO2: 26 mmol/L (ref 22–32)
Calcium: 9.1 mg/dL (ref 8.9–10.3)
Chloride: 100 mmol/L (ref 98–111)
Creatinine, Ser: 4.32 mg/dL — ABNORMAL HIGH (ref 0.44–1.00)
GFR, Estimated: 10 mL/min — ABNORMAL LOW (ref >60)
Glucose, Bld: 157 mg/dL — ABNORMAL HIGH (ref 70–99)
Potassium: 3.3 mmol/L — ABNORMAL LOW (ref 3.5–5.1)
Sodium: 134 mmol/L — ABNORMAL LOW (ref 135–145)

## 2021-02-26 LAB — GLUCOSE, CAPILLARY
Glucose-Capillary: 117 mg/dL — ABNORMAL HIGH (ref 70–99)
Glucose-Capillary: 188 mg/dL — ABNORMAL HIGH (ref 70–99)
Glucose-Capillary: 189 mg/dL — ABNORMAL HIGH (ref 70–99)
Glucose-Capillary: 214 mg/dL — ABNORMAL HIGH (ref 70–99)
Glucose-Capillary: 221 mg/dL — ABNORMAL HIGH (ref 70–99)

## 2021-02-26 MED ORDER — TRAMADOL HCL 50 MG PO TABS
50.0000 mg | ORAL_TABLET | Freq: Two times a day (BID) | ORAL | Status: DC | PRN
  Administered 2021-02-28 – 2021-03-05 (×2): 50 mg via ORAL
  Filled 2021-02-26 (×2): qty 1

## 2021-02-26 MED ORDER — INSULIN ASPART 100 UNIT/ML IJ SOLN
0.0000 [IU] | Freq: Three times a day (TID) | INTRAMUSCULAR | Status: DC
  Administered 2021-02-26 – 2021-02-27 (×3): 3 [IU] via SUBCUTANEOUS
  Administered 2021-02-27: 2 [IU] via SUBCUTANEOUS
  Administered 2021-02-27: 3 [IU] via SUBCUTANEOUS
  Administered 2021-02-28 (×3): 5 [IU] via SUBCUTANEOUS
  Administered 2021-02-28: 2 [IU] via SUBCUTANEOUS
  Administered 2021-03-01: 5 [IU] via SUBCUTANEOUS
  Administered 2021-03-01: 3 [IU] via SUBCUTANEOUS
  Administered 2021-03-01 (×2): 2 [IU] via SUBCUTANEOUS
  Administered 2021-03-02 (×2): 5 [IU] via SUBCUTANEOUS
  Administered 2021-03-02: 1 [IU] via SUBCUTANEOUS
  Administered 2021-03-03 (×4): 2 [IU] via SUBCUTANEOUS
  Administered 2021-03-04: 3 [IU] via SUBCUTANEOUS
  Administered 2021-03-04: 2 [IU] via SUBCUTANEOUS
  Administered 2021-03-04 – 2021-03-05 (×2): 1 [IU] via SUBCUTANEOUS

## 2021-02-26 MED ORDER — CHLORHEXIDINE GLUCONATE CLOTH 2 % EX PADS: 6.0000 | MEDICATED_PAD | Freq: Every day | CUTANEOUS | Status: DC

## 2021-02-26 MED ORDER — AMIODARONE HCL IN DEXTROSE 360-4.14 MG/200ML-% IV SOLN
60.0000 mg/h | INTRAVENOUS | Status: DC
  Administered 2021-02-27: 60 mg/h via INTRAVENOUS
  Administered 2021-02-27: 30 mg/h via INTRAVENOUS
  Administered 2021-02-28 – 2021-03-04 (×17): 60 mg/h via INTRAVENOUS
  Filled 2021-02-26 (×19): qty 200

## 2021-02-26 MED ORDER — METOPROLOL TARTRATE 5 MG/5ML IV SOLN
2.5000 mg | Freq: Once | INTRAVENOUS | Status: AC
  Administered 2021-02-26: 2.5 mg via INTRAVENOUS
  Filled 2021-02-26: qty 5

## 2021-02-26 MED ORDER — MIDODRINE HCL 5 MG PO TABS
5.0000 mg | ORAL_TABLET | Freq: Three times a day (TID) | ORAL | Status: DC
  Administered 2021-02-26 – 2021-02-27 (×3): 5 mg via ORAL
  Filled 2021-02-26 (×4): qty 1

## 2021-02-26 MED ORDER — AMIODARONE HCL IN DEXTROSE 360-4.14 MG/200ML-% IV SOLN
60.0000 mg/h | INTRAVENOUS | Status: DC
  Administered 2021-02-26: 60 mg/h via INTRAVENOUS
  Filled 2021-02-26 (×2): qty 200

## 2021-02-26 NOTE — Assessment & Plan Note (Signed)
-   new echo this admission: EF 25-30 %.  Grade 2 DD. Patient underwent LHC showing nonobstructive CAD but "unusual coronary tree" with small LAD and very large dominant RCA -Cardiology continues to follow -suspect still will need more med changes prior to d/c

## 2021-02-26 NOTE — Hospital Course (Signed)
Stephanie Woodward is a 81 yo female with PMH PAF, CHF, ESRD on HD, COPD, IDDM, gout, HLD, HTN, sleep apnea who initially presented to the hospital on 02/17/2021 with shortness of breath and palpitations.  She was found to be in A. fib with RVR.  She had further decreased level of responsiveness and ultimately required intubation for airway protection on 02/19/2021.  She then developed hypotension requiring vasopressor support.  She was able to be extubated on 02/20/2021 and was started on midodrine to help with Levophed weaning.  She was managed by cardiology as well. She underwent LHC on 02/24/21 which showed nonobstructive CAD although an abnormal coronary tree.

## 2021-02-26 NOTE — Progress Notes (Addendum)
[facility] Progress Note   Subjective:    Last HD on 1/4 with 1 kg UF.  She has been transferred out to the floor on [address].  She feels well.  Her midodrine was weaned to 5 mg TID  Review of systems:    Denies shortness of breath  Denies chest pain Denies n/v  Objective Vitals:   02/26/21 0000 02/26/21 0159 02/26/21 0329 02/26/21 0900  BP: 124/70 (!) 162/46 (!) 151/74 (!) 109/56  Pulse: 68 75 (!) 56 96  Resp: 13 19 (!) 21 17  Temp:  99.3 F (37.4 C) 98.5 F (36.9 C) 98.3 F (36.8 C)  TempSrc:  Oral Oral Oral  SpO2: 100% 99% 96% 100%  Weight:  84.2 kg    Height:  5\' 3"  (1.6 m)     Physical Exam  General: elderly female in bed in NAD  HEENT NCAT Neck: supple trachea midline Heart: Irregular; S1S2 no rub Lungs: Clear to auscultation; unlabored on room air  Abdomen: soft/nt/obese habitus Extremities: No edema lower extremities Psych normal mood and affect Neuro -alert and oriented x 3 provides hx and follows commands Dialysis Access: LUE AVF with bruit and thrill; RIJ nontunneled dialysis catheter  OP HD:  [place] [facility]     4h  350/500  87.2kg  2/2 bath  P2  Hep none   - hect 5 ug tiw   - venofer 50 q wk  - no esa     CXR 12/27 - IMPRESSION: Cardiomegaly with mild pulmonary interstitial edema and trace bilateral pleural effusions suggesting CHF.    CXR 12/29 - IMPRESSION: Cardiomegaly, pulmonary vascular congestion and increased hazy opacities since previous study which may represent increasing edema/infiltrate.  Assessment/Plan: ESRD - CRRT started 12/30 and stopped 1/1.  Now on HD again per a MWF schedule.  Discontinue nontunneled HD catheter - order placed AMS - delirium. Neuro consulted. improved Acute resp insufficiency - extubated 12/30. +fever/ ^wbc/ pulm infiltrates. S/p broad spectrum IV abx w/ vanc/ zosyn. Extra volume removed w/ CRRT with CXR and exam improved (3-4 kg under dry weight and CVP low after that).  Optimize volume with HD Hypotension  -off of levo and on floor.  Note midodrine decreased for HTN  Paroxysmal Afib - cardioverted in ED 12/27.  po amio per primary team Anemia of CKD- Hb acceptable; defer ESA For now MBD ckd - Cont hectorol here. No binders on home med list and phos normal last check  Disposition - per primary team.  Will alert HD SW that d/c plan appears to be a SNF   Additional Objective Labs: Basic Metabolic Panel: CBC: Recent Labs  Lab 02/21/21 0401 02/22/21 0830 02/23/21 0339 02/24/21 0400 02/25/21 0608 02/26/21 0300  WBC 16.4*  --  15.5* 14.4* 14.5* 10.1  HGB 10.5*   < > 10.7* 11.1* 10.8* 10.6*  HCT 32.7*   < > 34.3* 34.3* 35.3* 32.6*  MCV 95.3  --  99.7 98.6 100.6* 95.3  PLT 288  --  314 288 275 229   < > = values in this interval not displayed.   Recent Labs  Lab 02/25/21 1538 02/25/21 2001 02/25/21 2320 02/26/21 0447 02/26/21 0753  GLUCAP 157* 185* 190* 117* 188*   Iron Studies: No results for input(s): IRON, TIBC, TRANSFERRIN, FERRITIN in the last 72 hours. Lab Results  Component Value Date   INR 1.1 11/17/2018   Medications:  sodium chloride Stopped (02/25/21 1953)   sodium chloride     sodium chloride  amiodarone  100 mg Oral Daily   apixaban  5 mg Oral BID   atorvastatin  80 mg Oral QPM   Chlorhexidine Gluconate Cloth  6 each Topical Q0600   doxercalciferol  5 mcg Intravenous Q M,W,F-HD   feeding supplement (NEPRO CARB STEADY)  237 mL Oral TID WC   insulin aspart  0-9 Units Subcutaneous Q4H   latanoprost  1 drop Both Eyes QHS   lidocaine  1 patch Transdermal Q24H   midodrine  5 mg Oral TID WC   mometasone-formoterol  2 puff Inhalation BID   multivitamin  1 tablet Oral QHS   pantoprazole  20 mg Oral BID   QUEtiapine  25 mg Oral QHS   sodium chloride flush  3 mL Intravenous Q12H    Claudia Desanctis, MD 02/26/2021 11:30 AM

## 2021-02-26 NOTE — Progress Notes (Signed)
Pt in afib RVR, rate 120s to 140s. BP wnL. Attending aware. Lopressor 2.5mg  IV given. No changes. Cardiology paged. Awaiting response. Family bedside.

## 2021-02-26 NOTE — Progress Notes (Signed)
Progress Note    [name]   MVH:[id number]  DOB: 04-25-1940  DOA: 02/17/2021     7 PCP: Larey Dresser, MD  Initial CC: SOB, palpitations   Hospital Course: Stephanie Woodward is a 81 yo female with PMH PAF, CHF, ESRD on HD, COPD, IDDM, gout, HLD, HTN, sleep apnea who initially presented to the hospital on 02/17/2021 with shortness of breath and palpitations.  She was found to be in A. fib with RVR.  She had further decreased level of responsiveness and ultimately required intubation for airway protection on 02/19/2021.  She then developed hypotension requiring vasopressor support.  She was able to be extubated on 02/20/2021 and was started on midodrine to help with Levophed weaning.  She was managed by cardiology as well. She underwent LHC on 02/24/21 which showed nonobstructive CAD although an abnormal coronary tree.   Interval History:  No events overnight.  Seen in her room this morning.  She was resting comfortably in bed and overall starting to feel better slowly.  Assessment & Plan: * Paroxysmal atrial fibrillation with RVR ([place])- (present on admission) - Continue amiodarone and Eliquis  Acute on chronic combined systolic and diastolic CHF (congestive heart failure) ([place]) - new echo this admission: EF 25-30 %.  Grade 2 DD. Patient underwent LHC showing nonobstructive CAD but "unusual coronary tree" with small LAD and very large dominant RCA -Cardiology continues to follow -suspect still will need more med changes prior to d/c   Hypotension - Weaned off of Levophed while in the ICU however required midodrine to help with weaning.  Blood pressure has been slowly improving, slightly hypertensive this morning - Midodrine has been weaned to 5 mg 3 times daily per cardiology, continue monitoring pressure for ability to further wean  Acute respiratory failure with hypoxia (HCC)-resolved as of 02/26/2021 - Likely multifactorial in setting of CHF exacerbation initially - Has been weaned  down to room air  ESRD on hemodialysis ([place]) - Continue dialysis per nephrology  Hyperlipidemia- (present on admission) - Continue Lipitor  Type 2 diabetes mellitus, controlled, with renal complications ([place])- (present on admission) - Continue SSI and CBG monitoring  COPD (chronic obstructive pulmonary disease) ([place])- (present on admission) - no s/s exac    Old records reviewed in assessment of this patient  Antimicrobials:   DVT prophylaxis: Eliquis  Code Status:   Code Status: Full Code  Disposition Plan: SNF Status is: Inpatient  Objective: Blood pressure 118/60, pulse 80, temperature 98.7 F (37.1 C), temperature source Oral, resp. rate 20, height 5\' 3"  (1.6 m), weight 84.2 kg, SpO2 99 %.  Examination:  Physical Exam Constitutional:      Appearance: Normal appearance.  HENT:     Head: Normocephalic and atraumatic.     Mouth/Throat:     Mouth: Mucous membranes are moist.  Eyes:     Extraocular Movements: Extraocular movements intact.  Cardiovascular:     Rate and Rhythm: Normal rate and regular rhythm.  Pulmonary:     Effort: Pulmonary effort is normal.     Breath sounds: Normal breath sounds.  Abdominal:     General: Bowel sounds are normal. There is no distension.     Palpations: Abdomen is soft.     Tenderness: There is no abdominal tenderness.  Musculoskeletal:        General: Normal range of motion.     Cervical back: Normal range of motion and neck supple.  Skin:    General: Skin is warm and dry.  Neurological:     General: No focal deficit present.     Mental Status: She is alert.  Psychiatric:        Mood and Affect: Mood normal.        Behavior: Behavior normal.     Consultants:  Nephrology Cardiology  Procedures:    Data Reviewed: I have personally reviewed labs and imaging studies    LOS: 7 days  Time spent: Greater than 50% of the 35 minute visit was spent in counseling/coordination of care for the patient as laid out in the  A&P.   Dwyane Dee, MD [facility] 02/26/2021, 2:46 PM

## 2021-02-26 NOTE — Assessment & Plan Note (Signed)
-   Continue dialysis per nephrology

## 2021-02-26 NOTE — Assessment & Plan Note (Signed)
-   Likely multifactorial in setting of CHF exacerbation initially - Has been weaned down to room air

## 2021-02-26 NOTE — Assessment & Plan Note (Addendum)
-   Continue amiodarone and Eliquis - oral amio stopped on 1/5 as patient developed afib RVR again; did not respond to test dose of 2.5 mg IV lopressor and BP was already borderline, therefore cardiology started back on amio IV drip evening of 1/5. -Rate better on IV amiodarone but remains dependent; plan is for DCCV with cardiology on 03/03/21 if still in afib

## 2021-02-26 NOTE — Assessment & Plan Note (Signed)
-   no s/s exac - hold lasix 

## 2021-02-26 NOTE — Assessment & Plan Note (Signed)
-  Continue Lipitor °

## 2021-02-26 NOTE — Assessment & Plan Note (Addendum)
-   Ac 9.1% on 02/18/21 - CBGs rather labile on just sliding scale but with ESRD I'm worried about clearance and accumulation if using basal - will discuss her diet to make sure she's compliant and try and adjust regimen as necessary  -Glucose levels better after carb restricting her diet - Continue SSI and CBG monitoring

## 2021-02-26 NOTE — Assessment & Plan Note (Deleted)
-   new echo this admission: EF 25-30 %.  Grade 2 DD. Patient underwent LHC showing nonobstructive CAD but "unusual coronary tree" with small LAD and very large dominant RCA -Cardiology continues to follow -suspect still will need more med changes prior to d/c

## 2021-02-26 NOTE — Progress Notes (Signed)
Pt in a fib with RVR 137, rec'd IV loperssor without change in rate.  Will add back amiodarone IV without bolus to prevent brady -  RN to call if HR decreases.  I stopped po amiodarone for now

## 2021-02-26 NOTE — Plan of Care (Signed)
  Problem: Nutrition: Goal: Adequate nutrition will be maintained Outcome: Progressing   Problem: Coping: Goal: Level of anxiety will decrease Outcome: Progressing   

## 2021-02-26 NOTE — Care Management Important Message (Signed)
Important Message  Patient Details  Name: Stephanie Woodward MRN: [id number] Date of Birth: 06/15/1940   Medicare Important Message Given:  Yes     Shelda Altes 02/26/2021, 7:30 AM

## 2021-02-26 NOTE — Progress Notes (Addendum)
Patient ID: Stephanie Woodward, female   DOB: 1940/06/06, 81 y.o.   MRN: [id number]     Advanced Heart Failure Rounding Note  PCP-Cardiologist: Ezzard Standing, MD   Subjective:    12/29: Found unresponsive, no arrhythmias noted. Intubated. Started on NE.  MRI brain without acute process. 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  Had fever to 101F. Started on broad spectrum abx and pan cultures sent.  12/30: CVVH started. Extubated.  Echo reviewed: EF 30-35% range, worse function in septum; mild RV dysfunction, IVC dilated.  01/01: CRRT stopped 01/03: LHC showed no obstructive CAD. 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   Off CRRT. Back on iHD   No further hypotension. Midodrine added this admit to help w/ NE wean. Now w/ supine hypertension. SBPs 150s-170s.    Overall, feels much better. Denies dyspnea. No CP.   PT recommending SNF.   Objective:   Weight Range: 84.2 kg Body mass index is 32.88 kg/m.   Vital Signs:   Temp:  [98.1 F (36.7 C)-99.3 F (37.4 C)] 98.5 F (36.9 C) (01/05 0329) Pulse Rate:  [50-175] 56 (01/05 0329) Resp:  [0-28] 21 (01/05 0329) BP: (106-187)/(43-81) 151/74 (01/05 0329) SpO2:  [94 %-100 %] 96 % (01/05 0329) Arterial Line BP: (111-150)/(29-55) 121/41 (01/04 1900) Weight:  [84.2 kg-85.3 kg] 84.2 kg (01/05 0159) Last BM Date: 02/25/21  Weight change: Filed Weights   02/25/21 1319 02/25/21 1658 02/26/21 0159  Weight: 85.3 kg 84.3 kg 84.2 kg    Intake/Output:   Intake/Output Summary (Last 24 hours) at 02/26/2021 0819 Last data filed at 02/25/2021 2000 Gross per 24 hour  Intake 187.68 ml  Output 1000 ml  Net -812.32 ml      Physical Exam    General:  Well appearing. No respiratory difficulty HEENT: normal Neck: supple. no JVD. + Rt IJ HD  cath. Carotids 2+ bilat; no bruits. No lymphadenopathy or thyromegaly appreciated. Cor: PMI nondisplaced. Irregular rhythm. No rubs, gallops or murmurs. Lungs: clear Abdomen: soft, nontender, nondistended. No hepatosplenomegaly. No bruits or masses. Good bowel sounds. Extremities: no cyanosis, clubbing, rash, edema Neuro: alert & oriented x 3, cranial nerves grossly intact. moves all 4 extremities w/o difficulty. Affect pleasant.   Telemetry   NSR w/ PACs 90s  (personally reviewed)  Labs    CBC Recent Labs    02/25/21 0608 02/26/21 0300  WBC 14.5* 10.1  HGB 10.8* 10.6*  HCT 35.3* 32.6*  MCV 100.6* 95.3  PLT 275 956   Basic Metabolic Panel Recent Labs    02/25/21 0608 02/26/21 0300  NA 135 134*  K 4.5 3.3*  CL 103 100  CO2 19* 26  GLUCOSE 142* 157*  BUN 35* 13  CREATININE 7.33* 4.32*  CALCIUM 9.5 9.1  PHOS 4.1  --    Liver Function Tests No results for input(s): AST, ALT, ALKPHOS, BILITOT, PROT, ALBUMIN in the last 72 hours.  No results for input(s): LIPASE, AMYLASE in the last 72 hours. Cardiac Enzymes No results for input(s): CKTOTAL, CKMB, CKMBINDEX, TROPONINI in the last 72 hours.  BNP: BNP (last 3 results) Recent Labs    06/08/20 1011  BNP 402.0*    ProBNP (last 3 results) No results for input(s): PROBNP in the last 8760 hours.   D-Dimer No results for input(s): DDIMER in the last 72 hours. Hemoglobin A1C  No results for input(s): HGBA1C in the last 72 hours.  Fasting Lipid Panel No results for input(s): CHOL, HDL, LDLCALC, TRIG, CHOLHDL, LDLDIRECT in the last 72 hours.  Thyroid Function Tests No results for input(s): TSH, T4TOTAL, T3FREE, THYROIDAB in the last 72 hours.  Invalid input(s): FREET3   Other results:   Imaging    No results found.   Medications:     Scheduled Medications:  amiodarone  100 mg Oral Daily   apixaban  5 mg Oral BID   atorvastatin  80 mg Oral QPM   Chlorhexidine Gluconate Cloth  6 each Topical Q0600    doxercalciferol  5 mcg Intravenous Q M,W,F-HD   feeding supplement (NEPRO CARB STEADY)  237 mL Oral TID WC   insulin aspart  0-9 Units Subcutaneous Q4H   latanoprost  1 drop Both Eyes QHS   lidocaine  1 patch Transdermal Q24H   midodrine  10 mg Oral TID WC   mometasone-formoterol  2 puff Inhalation BID   multivitamin  1 tablet Oral QHS   pantoprazole  20 mg Oral BID   QUEtiapine  25 mg Oral QHS   sodium chloride flush  3 mL Intravenous Q12H    Infusions:  sodium chloride Stopped (02/25/21 1953)   sodium chloride     sodium chloride     norepinephrine (LEVOPHED) Adult infusion Stopped (02/25/21 0814)    PRN Medications: sodium chloride, sodium chloride, sodium chloride, acetaminophen, alteplase, heparin, hydrALAZINE, ipratropium-albuterol, lidocaine (PF), lidocaine-prilocaine, ondansetron (ZOFRAN) IV, ondansetron **OR** [DISCONTINUED] ondansetron (ZOFRAN) IV, pentafluoroprop-tetrafluoroeth, senna-docusate   Assessment/Plan   1.  Atrial fibrillation: Known hx paroxysmal AF.  Seen in ED at [facility] the week PTA for AF and spontaneously converted to SR.  Presented 02/17/21 with AF with RVR. Cardioverted in ER.  She was in junctional rhythm after respiratory arrest and intubation with rate upper 40s-50s. Rhythm on 1/3 appeared to be wandering atrial pacemaker, and amiodarone stopped.  This morning, NSR w/ PACs in 90s.  - Amiodarone restarted, continue 100 mg daily.  - Continue Eliquis.  2. Acute systolic CHF: Echo in 5/97 with EF 50-55%, mild LVH, grade 2 diastolic dysfunction.  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  [place] 11/23/17 showed significantly elevated right and left heart filling pressures and severe primarily pulmonary venous hypertension. Cardiac output preserved. Echo (10/19) with EF 45-50%, septal-lateral dyssynchrony, RV looked ok.  PYP scan was not suggestive of transthyretin amyloidosis.  Echo in 1/21  with EF 55-60%, normal RV.  RHC/LHC in 4/21 with no significant CAD, normal filling pressures and cardiac output.  Echo this admission showed EF down to 35%.  HS-TnI 385 => 378, likely demand ischemia with hypotension/respiratory arrest.  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 Was on NE. Now off and on midodrine  - Leave off NE as long as SBP > 120 (runs low diastolic pressure).  - Continue midodrine, with supine hypertension, will wean to 5 tid (may ultimately only need on HD days)  - Would like eventual coronary CT to make sure we are not missing an anomalous vessel with unusual coronary tree on cath.  3. Acute respiratory failure:  Minimally responsive 12/29 and intubated for airway protection.  Extubated 12/30, stable today. Possible aspiration event with baseline OHS. Chest x-ray with increasing LLL airspace disease. Trach aspirate with  normal flora, BC negative.  Seen by SLP, dysphagia 3 diet recommended.  - Completed tx w/ ceftriaxone 4. Acute encephalopathy: Head CT and MRI brain without acute process. Bilateral intracranial ICA stenosis (but no significant extracranial disease). EEG with no seizure activity. Delirium seems to be improving.   5. ESRD: Followed by Dr. Hollie Salk as outpatient.  Volume status optimized on CVVH (stopped 1/1).  - Transitioned back to iHD. Management per nephrology  6. Pulmonary HTN: Mild PH on 4/21 RHC.  7. Hyperlipidemia: on atorvastatin 80.  8. DM II: A1c 9.1 - Per primary team 9. ID: Elevated WBCs, fever on 12/29. WBCs now trending down. ?Aspiration PNA.  - Vanc/zosyn switched to ceftriaxone. Completed 7 day course    Lyda Jester, PA-C  02/26/2021 8:19 AM  Agree with the above PA note.   Would wean midodrine to 5 mg tid as BP now elevated.  Back on iHD without problems. NSR 80s on  amiodarone 100 mg daily.    Will need SNF.   Loralie Champagne 02/26/2021

## 2021-02-26 NOTE — Assessment & Plan Note (Addendum)
-   Weaned off of Levophed while in the ICU however required midodrine to help with weaning.  Blood pressure has been slowly improving, slightly hypertensive this morning - Midodrine weaned to 5 mg 3 times daily per cardiology but developed more hypoTN again, dosing increased back to 10 mg TID -Blood pressure has improved but borderline low, thus unable to wean midodrine currently

## 2021-02-26 NOTE — Progress Notes (Signed)
Inpatient Diabetes Program Recommendations  AACE/ADA: New Consensus Statement on Inpatient Glycemic Control (2015)  Target Ranges:  Prepandial:   less than 140 mg/dL      Peak postprandial:   less than 180 mg/dL (1-2 hours)      Critically ill patients:  140 - 180 mg/dL   Lab Results  Component Value Date   GLUCAP 188 (H) 02/26/2021   HGBA1C 9.1 (H) 02/18/2021    Review of Glycemic Control  Latest Reference Range & Units 02/25/21 20:01 02/25/21 23:20 02/26/21 04:47 02/26/21 07:53  Glucose-Capillary 70 - 99 mg/dL 185 (H) 190 (H) 117 (H) 188 (H)  (H): Data is abnormally high Diabetes history: Type 2 DM Outpatient Diabetes medications: Tresiba 18 units QD, Novolog 12/02/10 TID Current orders for Inpatient glycemic control: Novolog 0-9 units Q4H  Inpatient Diabetes Program Recommendations:    Consider adding Levemir 8 units QD.   Thanks, Bronson Curb, MSN, RNC-OB Diabetes Coordinator [phone] (8a-5p)

## 2021-02-26 NOTE — TOC Progression Note (Addendum)
Transition of Care [facility]) - Progression Note    Patient Details  Name: Stephanie Woodward MRN: [id number] Date of Birth: Jun 05, 1940  [facility] of [facility]) CM/SW Contact  Prarthana Parlin, LCSW Phone Number: 02/26/2021, 2:29 PM  Clinical Narrative:    HF CSW spoke with [name] at bedside to discuss SNF placement at time of discharge and provided her with the Medicare rating list. [name] is agreeable for the CSW to speak with her daughter, [name] to discuss the SNF's and plan for discharge.   2:06pm - HF CSW reached out to [facility] to see about the offer extended in the hub and see about bed availability however Tressa Busman didn't answer the phone and CSW left a voicemail for him to return the call. HF CSW received a text from [facility] who reported he would be able to accommodate the outpatient HD.  2:55pm - HF CSW spoke with the patient's daughter, [name] [phone] and informed her about the bed offer from [facility] and answered questions regarding the SNF's and expectations. CSW discussed about transportation at the time of discharge as [employer] will want to know if they will need approval for that and [name] agreed to go ahead and request transportation at time of discharge in case it is needed.  3:21pm - HF CSW spoke with [employer] to initiate the insurance authorization. Pending authorization for the SNF and transportation at time of discharge.  4:51pm - HF CSW received a call from [address] with [employer] reporting that insurance authorization was approved for the SNF and non-emergent transportation at time of discharge. SNF approval [phone] and approved for 7 days and PTAR approval [phone]. CSW to call Tammy back at [phone] when the family has officially picked a SNF as the facility needs to be in-network with the health insurance. CSW to follow up with the patient's daughter tomorrow about SNF choice for discharge.  CSW will continue to follow throughout  discharge.   Expected Discharge Plan: [place] Barriers to Discharge: Continued Medical Work up  Expected Discharge Plan and Services Expected Discharge Plan: [place] In-house Referral: Clinical Social Work Discharge Planning Services: CM Consult Post Acute Care Choice: [place] Living arrangements for the past 2 months: Apartment                                       Social Determinants of Health (SDOH) Interventions    Readmission Risk Interventions No flowsheet data found.

## 2021-02-27 DIAGNOSIS — I480 Paroxysmal atrial fibrillation: Secondary | ICD-10-CM | POA: Diagnosis not present

## 2021-02-27 DIAGNOSIS — I959 Hypotension, unspecified: Secondary | ICD-10-CM

## 2021-02-27 LAB — CBC WITH DIFFERENTIAL/PLATELET
Abs Immature Granulocytes: 0.13 10*3/uL — ABNORMAL HIGH (ref 0.00–0.07)
Basophils Absolute: 0.1 10*3/uL (ref 0.0–0.1)
Basophils Relative: 1 %
Eosinophils Absolute: 0.2 10*3/uL (ref 0.0–0.5)
Eosinophils Relative: 2 %
HCT: 34.2 % — ABNORMAL LOW (ref 36.0–46.0)
Hemoglobin: 10.8 g/dL — ABNORMAL LOW (ref 12.0–15.0)
Immature Granulocytes: 1 %
Lymphocytes Relative: 18 %
Lymphs Abs: 1.9 10*3/uL (ref 0.7–4.0)
MCH: 30.7 pg (ref 26.0–34.0)
MCHC: 31.6 g/dL (ref 30.0–36.0)
MCV: 97.2 fL (ref 80.0–100.0)
Monocytes Absolute: 1.2 10*3/uL — ABNORMAL HIGH (ref 0.1–1.0)
Monocytes Relative: 11 %
Neutro Abs: 7.3 10*3/uL (ref 1.7–7.7)
Neutrophils Relative %: 67 %
Platelets: 237 10*3/uL (ref 150–400)
RBC: 3.52 MIL/uL — ABNORMAL LOW (ref 3.87–5.11)
RDW: 15.6 % — ABNORMAL HIGH (ref 11.5–15.5)
WBC: 10.8 10*3/uL — ABNORMAL HIGH (ref 4.0–10.5)
nRBC: 0.9 % — ABNORMAL HIGH (ref 0.0–0.2)

## 2021-02-27 LAB — BASIC METABOLIC PANEL
Anion gap: 11 (ref 5–15)
BUN: 25 mg/dL — ABNORMAL HIGH (ref 8–23)
CO2: 25 mmol/L (ref 22–32)
Calcium: 9.3 mg/dL (ref 8.9–10.3)
Chloride: 100 mmol/L (ref 98–111)
Creatinine, Ser: 6.48 mg/dL — ABNORMAL HIGH (ref 0.44–1.00)
GFR, Estimated: 6 mL/min — ABNORMAL LOW (ref >60)
Glucose, Bld: 206 mg/dL — ABNORMAL HIGH (ref 70–99)
Potassium: 3.4 mmol/L — ABNORMAL LOW (ref 3.5–5.1)
Sodium: 136 mmol/L (ref 135–145)

## 2021-02-27 LAB — GLUCOSE, CAPILLARY
Glucose-Capillary: 212 mg/dL — ABNORMAL HIGH (ref 70–99)
Glucose-Capillary: 140 mg/dL — ABNORMAL HIGH (ref 70–99)
Glucose-Capillary: 171 mg/dL — ABNORMAL HIGH (ref 70–99)
Glucose-Capillary: 250 mg/dL — ABNORMAL HIGH (ref 70–99)

## 2021-02-27 LAB — MAGNESIUM: Magnesium: 2.4 mg/dL (ref 1.7–2.4)

## 2021-02-27 MED ORDER — AMIODARONE LOAD VIA INFUSION
150.0000 mg | Freq: Once | INTRAVENOUS | Status: DC
Start: 2021-02-27 — End: 2021-02-27

## 2021-02-27 MED ORDER — MIDODRINE HCL 5 MG PO TABS
10.0000 mg | ORAL_TABLET | Freq: Three times a day (TID) | ORAL | Status: DC
  Administered 2021-02-27 – 2021-03-05 (×15): 10 mg via ORAL
  Filled 2021-02-27 (×18): qty 2

## 2021-02-27 MED ORDER — ALBUMIN HUMAN 25 % IV SOLN: 12.5000 g | Freq: Once | INTRAVENOUS | Status: AC

## 2021-02-27 MED ORDER — ALBUMIN HUMAN 25 % IV SOLN
INTRAVENOUS | Status: AC
  Administered 2021-02-27: 12.5 g via INTRAVENOUS
  Filled 2021-02-27: qty 50

## 2021-02-27 MED ORDER — AMIODARONE LOAD VIA INFUSION: 150.0000 mg | Freq: Once | INTRAVENOUS | Status: DC

## 2021-02-27 MED ORDER — AMIODARONE IV BOLUS ONLY 150 MG/100ML
150.0000 mg | Freq: Once | INTRAVENOUS | Status: AC
  Administered 2021-02-27: 150 mg via INTRAVENOUS
  Filled 2021-02-27: qty 100

## 2021-02-27 MED ORDER — MIDODRINE HCL 5 MG PO TABS
10.0000 mg | ORAL_TABLET | Freq: Once | ORAL | Status: AC
  Administered 2021-02-27: 10 mg via ORAL

## 2021-02-27 NOTE — Progress Notes (Signed)
OT Cancellation Note  Patient Details Name: Stephanie Woodward MRN: [id number] DOB: 03-22-1940   Cancelled Treatment:    Reason Eval/Treat Not Completed: Patient at procedure or test/ unavailable. Pt. At HD.  Will check back as able.    Clearnce Sorrel [place]-COTA/L 02/27/2021, 10:59 AM

## 2021-02-27 NOTE — Progress Notes (Addendum)
Patient ID: Stephanie Woodward, female   DOB: Mar 29, 1940, 81 y.o.   MRN: [id number]     Advanced Heart Failure Rounding Note  PCP-Cardiologist: Ezzard Standing, MD   Subjective:    12/29: Found unresponsive, no arrhythmias noted. Intubated. Started on NE.  MRI brain without acute process. 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  Had fever to 101F. Started on broad spectrum abx and pan cultures sent.  12/30: CVVH started. Extubated.  Echo reviewed: EF 30-35% range, worse function in septum; mild RV dysfunction, IVC dilated.  01/01: CRRT stopped 01/03: LHC showed no obstructive CAD. 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  1/5: Midodrine cut back to 5 mg tid.   SBP lower today.   Off CRRT. Back on iHD   Back in A fib RVR. Started on amio drip. Rate 130s.   Denies SOB.     Objective:   Weight Range: 87.1 kg Body mass index is 34.01 kg/m.   Vital Signs:   Temp:  [97.1 F (36.2 C)-99 F (37.2 C)] 97.1 F (36.2 C) (01/06 0800) Pulse Rate:  [80-135] 135 (01/06 0800) Resp:  [15-23] 20 (01/06 0800) BP: (97-124)/(51-105) 97/70 (01/06 0800) SpO2:  [91 %-100 %] 100 % (01/06 0800) Weight:  [85 kg-87.1 kg] 87.1 kg (01/06 0800) Last BM Date: 02/26/21  Weight change: Filed Weights   02/26/21 0159 02/27/21 0434 02/27/21 0800  Weight: 84.2 kg 85 kg 87.1 kg    Intake/Output:  No intake or output data in the 24 hours ending 02/27/21 0818     Physical Exam   General:   No resp difficulty HEENT: normal Neck: supple. no JVD. Carotids 2+ bilat; no bruits. No lymphadenopathy or thryomegaly appreciated.  Cor: PMI nondisplaced. Tachy/Irregular rate & rhythm. No rubs, gallops or murmurs. Lungs: clear Abdomen: soft, nontender, nondistended. No hepatosplenomegaly. No bruits or masses.  Good bowel sounds. Extremities: no cyanosis, clubbing, rash, edema. LUE AVF Neuro: alert & orientedx3, cranial nerves grossly intact. moves all 4 extremities w/o difficulty. Affect pleasant  Telemetry   A fib RVR 130s   Labs    CBC Recent Labs    02/26/21 0300 02/27/21 0159  WBC 10.1 10.8*  NEUTROABS  --  7.3  HGB 10.6* 10.8*  HCT 32.6* 34.2*  MCV 95.3 97.2  PLT 229 944   Basic Metabolic Panel Recent Labs    02/25/21 0608 02/26/21 0300 02/27/21 0159  NA 135 134* 136  K 4.5 3.3* 3.4*  CL 103 100 100  CO2 19* 26 25  GLUCOSE 142* 157* 206*  BUN 35* 13 25*  CREATININE 7.33* 4.32* 6.48*  CALCIUM 9.5 9.1 9.3  MG  --   --  2.4  PHOS 4.1  --   --    Liver Function Tests No results for input(s): AST, ALT, ALKPHOS, BILITOT, PROT, ALBUMIN in the last 72 hours.  No results for input(s): LIPASE, AMYLASE in the last 72 hours. Cardiac Enzymes No results for input(s): CKTOTAL, CKMB, CKMBINDEX, TROPONINI in the last 72 hours.  BNP: BNP (last 3 results) Recent Labs    06/08/20 1011  BNP 402.0*    ProBNP (last 3 results) No results for input(s): PROBNP in the last 8760 hours.   D-Dimer No results for input(s): DDIMER in the last 72 hours. Hemoglobin A1C No results for input(s): HGBA1C in the last 72  hours.  Fasting Lipid Panel No results for input(s): CHOL, HDL, LDLCALC, TRIG, CHOLHDL, LDLDIRECT in the last 72 hours.  Thyroid Function Tests No results for input(s): TSH, T4TOTAL, T3FREE, THYROIDAB in the last 72 hours.  Invalid input(s): FREET3   Other results:   Imaging    No results found.   Medications:     Scheduled Medications:  apixaban  5 mg Oral BID   atorvastatin  80 mg Oral QPM   Chlorhexidine Gluconate Cloth  6 each Topical Q0600   Chlorhexidine Gluconate Cloth  6 each Topical Q0600   doxercalciferol  5 mcg Intravenous Q M,W,F-HD   feeding supplement (NEPRO CARB STEADY)  237 mL Oral TID WC   insulin aspart  0-9 Units Subcutaneous TID  AC & HS   latanoprost  1 drop Both Eyes QHS   lidocaine  1 patch Transdermal Q24H   midodrine  5 mg Oral TID WC   mometasone-formoterol  2 puff Inhalation BID   multivitamin  1 tablet Oral QHS   pantoprazole  20 mg Oral BID   QUEtiapine  25 mg Oral QHS   sodium chloride flush  3 mL Intravenous Q12H    Infusions:  sodium chloride Stopped (02/25/21 1953)   sodium chloride     sodium chloride     amiodarone 30 mg/hr (02/27/21 0107)    PRN Medications: sodium chloride, sodium chloride, sodium chloride, acetaminophen, alteplase, heparin, hydrALAZINE, ipratropium-albuterol, lidocaine (PF), lidocaine-prilocaine, ondansetron (ZOFRAN) IV, ondansetron **OR** [DISCONTINUED] ondansetron (ZOFRAN) IV, pentafluoroprop-tetrafluoroeth, senna-docusate, traMADol   Assessment/Plan   1.  Atrial fibrillation: Known hx paroxysmal AF.  Seen in ED at [facility] the week PTA for AF and spontaneously converted to SR.  Presented 02/17/21 with AF with RVR. Cardioverted in ER.  She was in junctional rhythm after respiratory arrest and intubation with rate upper 40s-50s. Rhythm on 1/3 appeared to be wandering atrial pacemaker, and amiodarone stopped.   - Overnight back in A fib RVR. Now amio drip. Getting 150 mg bolus now.  - Continue amio drip and will increase to 60 mg per hour.   - Continue Eliquis.  2. Acute systolic CHF: Echo in 2/37 with EF 50-55%, mild LVH, grade 2 diastolic dysfunction.  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  [place] 11/23/17 showed significantly elevated right and left heart filling pressures and severe primarily pulmonary venous hypertension. Cardiac output preserved. Echo (10/19) with EF 45-50%, septal-lateral dyssynchrony, RV looked ok.  PYP scan was not suggestive of transthyretin amyloidosis.  Echo in 1/21 with EF 55-60%, normal RV.  RHC/LHC in 4/21 with no significant CAD, normal filling pressures and cardiac output.   Echo this admission showed EF down to 35%.  HS-TnI 385 => 378, likely demand ischemia with hypotension/respiratory arrest.  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 Was on NE. Now off and on midodrine  - Leave off NE as long as SBP > 120 (runs low diastolic pressure).  - SBP lower today with A fib RVR . Will need to increase midodrine to 10 mg tid.  - Would like eventual coronary CT to make sure we are not missing an anomalous vessel with unusual coronary tree on cath.  3. Acute respiratory failure:  Minimally responsive 12/29 and intubated for airway protection.  Extubated 12/30, stable today. Possible aspiration event with baseline OHS.  Chest x-ray with increasing LLL airspace disease. Trach aspirate with normal flora, BC negative.  Seen by SLP, dysphagia 3 diet recommended.  - Completed tx w/ ceftriaxone - Sats stable on room air 4. Acute encephalopathy: Head CT and MRI brain without acute process. Bilateral intracranial ICA stenosis (but no significant extracranial disease). EEG with no seizure activity. - Resolved.   5. ESRD: Followed by Dr. Hollie Salk as outpatient.  Volume status optimized on CVVH (stopped 1/1).  - Transitioned back to iHD. Management per nephrology  - Increase midodrine to 10 mg tid.  6. Pulmonary HTN: Mild PH on 4/21 RHC.  7. Hyperlipidemia: on atorvastatin 80.  8. DM II: A1c 9.1 - Per primary team 9. ID: Elevated WBCs, fever on 12/29. WBCs now trending down. ?Aspiration PNA.  - Vanc/zosyn switched to ceftriaxone. Completed 7 day course     Darrick Grinder, NP-C  02/27/2021 8:18 AM  Patient seen with NP, agree with the above note.   She went back into atrial fibrillation with RVR today, restarted on amiodarone gtt.    Currently in AF in 100s.  BP stable.  No complaints, she has just finished HD  .  Continue Eliquis, hopefully she will revert to NSR on her own as in the past. Will follow.   Loralie Champagne 02/27/2021 2:03 PM

## 2021-02-27 NOTE — Plan of Care (Signed)
  Problem: Activity: Goal: Risk for activity intolerance will decrease Outcome: Progressing   Problem: Nutrition: Goal: Adequate nutrition will be maintained Outcome: Progressing   Problem: Coping: Goal: Level of anxiety will decrease Outcome: Progressing   

## 2021-02-27 NOTE — Progress Notes (Signed)
[facility] Progress Note   Subjective:    She had a fib with RVR - on arrival to HD unit was on amio gtt.  HR was 130-140's and BP 90-100.  Gave NS 250 mL.  We contacted primary team and we bolused amio 150 mg IV once.  Seen and examined on dialysis.  Blood pressure 98/57 and HR 102.  We have her out of UF temporarily.  Note midodrine dose was reduced.  We are giving additional midodrine 10 mg once and albumin.  Review of systems:    Denies shortness of breath  Denies chest pain Denies n/v  Objective Vitals:   02/27/21 0616 02/27/21 0708 02/27/21 0800 02/27/21 0920  BP: 105/75 109/86 97/70 (!) 89/51  Pulse:  (!) 110 (!) 135 93  Resp: (!) 23 16 20 19   Temp:   (!) 97.1 F (36.2 C)   TempSrc:   Temporal   SpO2:  100% 100%   Weight:   87.1 kg   Height:       Physical Exam  General: elderly female in bed in NAD  HEENT NCAT Neck: supple trachea midline Heart: Irregular; S1S2 no rub Lungs: Clear to auscultation; unlabored on room air  Abdomen: soft/nt/obese habitus Extremities: No edema lower extremities Psych normal mood and affect Neuro -alert and oriented x 3 provides hx and follows commands Dialysis Access: LUE AVF in use; RIJ nontunneled dialysis catheter is out  OP HD:  [place] [facility]  (will be going to MWF schedule for SNF)   4h  350/500  87.2kg  2/2 bath  P2  Hep none   - hect 5 ug tiw   - venofer 50 q wk  - no esa     CXR 12/27 - IMPRESSION: Cardiomegaly with mild pulmonary interstitial edema and trace bilateral pleural effusions suggesting CHF.    CXR 12/29 - IMPRESSION: Cardiomegaly, pulmonary vascular congestion and increased hazy opacities since previous study which may represent increasing edema/infiltrate.  Assessment/Plan: ESRD - CRRT started 12/30 and stopped 1/1.  HD again per a MWF schedule (per request of her new SNF).   AMS - delirium. Neuro consulted. improved Acute resp insufficiency - extubated 12/30. +fever/ ^wbc/ pulm infiltrates. S/p  broad spectrum IV abx w/ vanc/ zosyn. Extra volume removed w/ CRRT with CXR and exam improved (3-4 kg under dry weight and CVP low after that).  Optimize volume with HD Hypotension -off of levo and on floor.  Note midodrine decreased for HTN.  Increased midodrine back to 10 mg TID as she has been hypotensive here today with her afib Paroxysmal Afib - cardioverted in ED 12/27.  S/p IV amio today as above and on amio gtt.  Per primary team - appreciate assistance Anemia of CKD- Hb acceptable; defer ESA For now MBD ckd - Cont hectorol here. No binders on home med list and phos normal last check  Disposition - per primary team.    Additional Objective Labs: Basic Metabolic Panel: CBC: Recent Labs  Lab 02/23/21 0339 02/24/21 0400 02/25/21 0608 02/26/21 0300 02/27/21 0159  WBC 15.5* 14.4* 14.5* 10.1 10.8*  NEUTROABS  --   --   --   --  7.3  HGB 10.7* 11.1* 10.8* 10.6* 10.8*  HCT 34.3* 34.3* 35.3* 32.6* 34.2*  MCV 99.7 98.6 100.6* 95.3 97.2  PLT 314 288 275 229 237   Recent Labs  Lab 02/26/21 0753 02/26/21 1157 02/26/21 1634 02/26/21 2132 02/27/21 0701  GLUCAP 188* 189* 214* 221* 212*  Iron Studies: No results for input(s): IRON, TIBC, TRANSFERRIN, FERRITIN in the last 72 hours. Lab Results  Component Value Date   INR 1.1 11/17/2018   Medications:  sodium chloride Stopped (02/25/21 1953)   sodium chloride     sodium chloride     amiodarone 30 mg/hr (02/27/21 0107)    apixaban  5 mg Oral BID   atorvastatin  80 mg Oral QPM   Chlorhexidine Gluconate Cloth  6 each Topical Q0600   Chlorhexidine Gluconate Cloth  6 each Topical Q0600   doxercalciferol  5 mcg Intravenous Q M,W,F-HD   feeding supplement (NEPRO CARB STEADY)  237 mL Oral TID WC   insulin aspart  0-9 Units Subcutaneous TID AC & HS   latanoprost  1 drop Both Eyes QHS   lidocaine  1 patch Transdermal Q24H   midodrine  10 mg Oral TID WC   mometasone-formoterol  2 puff Inhalation BID   multivitamin  1 tablet Oral  QHS   pantoprazole  20 mg Oral BID   QUEtiapine  25 mg Oral QHS   sodium chloride flush  3 mL Intravenous Q12H    Claudia Desanctis, MD 02/27/2021 10:01 AM

## 2021-02-27 NOTE — Progress Notes (Signed)
PT Cancellation Note  Patient Details Name: Stephanie Woodward MRN: [id number] DOB: [birth year]   Cancelled Treatment:    Reason Eval/Treat Not Completed: (P) Patient at procedure or test/unavailable Pt is off floor for HD. PT will follow back for treatment as able this afternoon.  [name] [name] PT, DPT Acute Rehabilitation Services Pager [phone] Office [phone]    [place] 02/27/2021, 8:09 AM

## 2021-02-27 NOTE — Progress Notes (Signed)
Progress Note    Stephanie Woodward   HUD:[id number]  DOB: [birth year]  DOA: 02/17/2021     8 PCP: Larey Dresser, MD  Initial CC: SOB, palpitations   Hospital Course: Stephanie Woodward is a 81 yo female with PMH PAF, CHF, ESRD on HD, COPD, IDDM, gout, HLD, HTN, sleep apnea who initially presented to the hospital on 02/17/2021 with shortness of breath and palpitations.  She was found to be in A. fib with RVR.  She had further decreased level of responsiveness and ultimately required intubation for airway protection on 02/19/2021.  She then developed hypotension requiring vasopressor support.  She was able to be extubated on 02/20/2021 and was started on midodrine to help with Levophed weaning.  She was managed by cardiology as well. She underwent LHC on 02/24/21 which showed nonobstructive CAD although an abnormal coronary tree.   Interval History:  Early yesterday evening she developed A. fib with RVR again and was reinitiated back on amiodarone drip.  This morning still in RVR (120s-130s) and amnio bolus was about to be administered when seen. She was still asymptomatic with no chest pain, palpitations, shortness of breath. She does still feel like her memory is impaired and has difficulty recalling events of hospitalization.  Assessment & Plan: * Paroxysmal atrial fibrillation with RVR ([place])- (present on admission) - Continue amiodarone and Eliquis - oral amio stopped on 1/5 as patient developed afib RVR again; did not respond to test dose of 2.5 mg IV lopressor and BP was already borderline, therefore cardiology started back on amio IV drip evening of 1/5. This morning still RVR in the 120s to 130s, so she is getting amio bolus as well this morning (1/6) and rate also increased per cardiology too   Acute on chronic combined systolic and diastolic CHF (congestive heart failure) ([place]) - new echo this admission: EF 25-30 %.  Grade 2 DD. Patient underwent LHC showing nonobstructive CAD but "unusual  coronary tree" with small LAD and very large dominant RCA -Cardiology continues to follow -suspect still will need more med changes prior to d/c   Hypotension - Weaned off of Levophed while in the ICU however required midodrine to help with weaning.  Blood pressure has been slowly improving, slightly hypertensive this morning - Midodrine weaned to 5 mg 3 times daily per cardiology but today developing more hypoTN again, dosing increased back to 10 mg TID  ESRD on hemodialysis ([place]) - Continue dialysis per nephrology  Acute respiratory failure with hypoxia (HCC)-resolved as of 02/26/2021 - Likely multifactorial in setting of CHF exacerbation initially - Has been weaned down to room air  Hyperlipidemia- (present on admission) - Continue Lipitor  Type 2 diabetes mellitus, controlled, with renal complications ([place])- (present on admission) - Continue SSI and CBG monitoring  COPD (chronic obstructive pulmonary disease) ([place])- (present on admission) - no s/s exac    Old records reviewed in assessment of this patient  Antimicrobials:   DVT prophylaxis: Eliquis  Code Status:   Code Status: Full Code  Disposition Plan: SNF Status is: Inpatient  Objective: Blood pressure (!) 128/58, pulse (!) 113, temperature 97.7 F (36.5 C), temperature source Temporal, resp. rate 20, height 5\' 3"  (1.6 m), weight 86.5 kg, SpO2 98 %.  Examination:  Physical Exam Constitutional:      Appearance: Normal appearance.  HENT:     Head: Normocephalic and atraumatic.     Mouth/Throat:     Mouth: Mucous membranes are moist.  Eyes:     Extraocular  Movements: Extraocular movements intact.  Cardiovascular:     Rate and Rhythm: Tachycardia present. Rhythm irregular.  Pulmonary:     Effort: Pulmonary effort is normal.     Breath sounds: Normal breath sounds.  Abdominal:     General: Bowel sounds are normal. There is no distension.     Palpations: Abdomen is soft.     Tenderness: There is no abdominal  tenderness.  Musculoskeletal:        General: Normal range of motion.     Cervical back: Normal range of motion and neck supple.  Skin:    General: Skin is warm and dry.  Neurological:     General: No focal deficit present.     Mental Status: She is alert.  Psychiatric:        Mood and Affect: Mood normal.        Behavior: Behavior normal.     Consultants:  Nephrology Cardiology  Procedures:    Data Reviewed: I have personally reviewed labs and imaging studies    LOS: 8 days  Time spent: Greater than 50% of the 35 minute visit was spent in counseling/coordination of care for the patient as laid out in the A&P.   Dwyane Dee, MD [facility] 02/27/2021, 1:41 PM

## 2021-02-27 NOTE — TOC Progression Note (Addendum)
Transition of Care [facility]) - Progression Note    Patient Details  Name: Stephanie Woodward MRN: [id number] Date of Birth: [birth year]  [facility] CM/SW Contact  Stephanie Lal, LCSW Phone Number: 02/27/2021, 12:13 PM  Clinical Narrative:    HF CSW reached out to the patient's daughter Stephanie Woodward [phone] to see if she has decided on a SNF for her mother, Stephanie Woodward. 11:22am - CSW received a call from Stephanie Woodward at [facility] asking about which facility Stephanie Woodward will discharge to as the insurance has been approved for 7 days however the SNF needs to be in network. CSW awaiting a call back from [place].  12:59pm- HF CSW received a call back from Stephanie Woodward the patients daughter and she reported that she has called both [place] and [place], [place] and has not heard back but she does intend to visit both facilities before making a decision. Stephanie Woodward reported there isn't a discharge date yet and that her mom went back into Afib last night and will let the CSW know about a facility soon and to please continue to reach out as needed.  1:14pm - CSW spoke with Stephanie Woodward at [facility] and she reported that [facility] isn't in network with them however [facility], SNF is in network. 1:17pm - CSW shared this information with Stephanie Woodward so that she can take [place] off the list.  2:52pm - HF CSW received a call from Stephanie Woodward reporting that the HF RNCM, Stephanie Woodward sent her a text message that she needs to choose a facility today. HF CSW has already been in communication with Stephanie Woodward about the SNF and DC plan and she is aware that Stephanie Woodward may be the only potential bed offer. Stephanie Woodward has not extended an offer in the hub and Stephanie Woodward has informed the CSW that they cannot hold beds for patients and to let them know closer to DC. Stephanie Woodward reported that her sister, Stephanie Woodward is currently at [facility] visiting the facility.  4:49pm - HF CSW received a call back from [facility] reporting that they will go with  [facility] if they have a bed available at time of discharge.  Renal navigator, Stephanie Woodward will need to be updated about the DC plan to confirm outpatient HD with [facility].  Awaiting patient's medical readiness for discharge. Insurance authorization has been approved for SNF [id number]) and transportation [phone]) at time of discharge.  CSW will continue to follow throughout discharge.   Expected Discharge Plan: Skilled Nursing Facility Barriers to Discharge: Continued Medical Work up  Expected Discharge Plan and Services Expected Discharge Plan: [place] In-house Referral: Clinical Social Work Discharge Planning Services: CM Consult Post Acute Care Choice: College Living arrangements for the past 2 months: Apartment                                       Social Determinants of Health (SDOH) Interventions Food Insecurity Interventions: Intervention Not Indicated Financial Strain Interventions: Intervention Not Indicated, Other (Comment) (Pt. denied any concerns about finances) Housing Interventions: Intervention Not Indicated (Pt. lives alone in an apartment with support from her daughters as needed) Transportation Interventions: Intervention Not Indicated, Other (Comment) (Pt. reports she is driving and doesn't have trouble getting to appointments)  Readmission Risk Interventions No flowsheet data found.

## 2021-02-28 DIAGNOSIS — I480 Paroxysmal atrial fibrillation: Secondary | ICD-10-CM | POA: Diagnosis not present

## 2021-02-28 LAB — CBC WITH DIFFERENTIAL/PLATELET
Abs Immature Granulocytes: 0.1 10*3/uL — ABNORMAL HIGH (ref 0.00–0.07)
Basophils Absolute: 0.1 10*3/uL (ref 0.0–0.1)
Basophils Relative: 1 %
Eosinophils Absolute: 0.2 10*3/uL (ref 0.0–0.5)
Eosinophils Relative: 2 %
HCT: 34.1 % — ABNORMAL LOW (ref 36.0–46.0)
Hemoglobin: 11.1 g/dL — ABNORMAL LOW (ref 12.0–15.0)
Immature Granulocytes: 1 %
Lymphocytes Relative: 16 %
Lymphs Abs: 1.7 10*3/uL (ref 0.7–4.0)
MCH: 31.4 pg (ref 26.0–34.0)
MCHC: 32.6 g/dL (ref 30.0–36.0)
MCV: 96.6 fL (ref 80.0–100.0)
Monocytes Absolute: 1 10*3/uL (ref 0.1–1.0)
Monocytes Relative: 10 %
Neutro Abs: 7.3 10*3/uL (ref 1.7–7.7)
Neutrophils Relative %: 70 %
Platelets: 215 10*3/uL (ref 150–400)
RBC: 3.53 MIL/uL — ABNORMAL LOW (ref 3.87–5.11)
RDW: 15.9 % — ABNORMAL HIGH (ref 11.5–15.5)
WBC: 10.3 10*3/uL (ref 4.0–10.5)
nRBC: 0.6 % — ABNORMAL HIGH (ref 0.0–0.2)

## 2021-02-28 LAB — BASIC METABOLIC PANEL
Anion gap: 9 (ref 5–15)
BUN: 18 mg/dL (ref 8–23)
CO2: 28 mmol/L (ref 22–32)
Calcium: 9.1 mg/dL (ref 8.9–10.3)
Chloride: 98 mmol/L (ref 98–111)
Creatinine, Ser: 4.47 mg/dL — ABNORMAL HIGH (ref 0.44–1.00)
GFR, Estimated: 10 mL/min — ABNORMAL LOW (ref >60)
Glucose, Bld: 300 mg/dL — ABNORMAL HIGH (ref 70–99)
Potassium: 3.5 mmol/L (ref 3.5–5.1)
Sodium: 135 mmol/L (ref 135–145)

## 2021-02-28 LAB — GLUCOSE, CAPILLARY
Glucose-Capillary: 256 mg/dL — ABNORMAL HIGH (ref 70–99)
Glucose-Capillary: 276 mg/dL — ABNORMAL HIGH (ref 70–99)
Glucose-Capillary: 265 mg/dL — ABNORMAL HIGH (ref 70–99)
Glucose-Capillary: 179 mg/dL — ABNORMAL HIGH (ref 70–99)

## 2021-02-28 LAB — MAGNESIUM: Magnesium: 2.2 mg/dL (ref 1.7–2.4)

## 2021-02-28 MED ORDER — GUAIFENESIN-DM 100-10 MG/5ML PO SYRP
5.0000 mL | ORAL_SOLUTION | ORAL | Status: DC | PRN
  Administered 2021-02-28: 5 mL via ORAL
  Filled 2021-02-28: qty 5

## 2021-02-28 NOTE — Progress Notes (Signed)
Pt.c/o  pressure on her left upper breast .Vital signs taken ;B/P=111/67;HR=108;EKG done ;MD on call made aware.Order not to give anything for now.Will continue to monitor pt.

## 2021-02-28 NOTE — Progress Notes (Signed)
[facility] Progress Note   Subjective:    Last HD on 1/6 complicated by afib with RVR managed with amio; due to hypotension she only had 200 mL UF. She feels ok this afternoon.  Her daughters are here at bedside.   Review of systems:   Denies shortness of breath  Had an EKG for tightness/discomfort under her left breast  Denies n/v - eating lunch   Objective Vitals:   02/28/21 0429 02/28/21 0534 02/28/21 0809 02/28/21 1100  BP:  111/67  (!) 119/53  Pulse:   100 94  Resp:  19 19 18   Temp:    98.9 F (37.2 C)  TempSrc:    Oral  SpO2:   97% 99%  Weight: 85.7 kg     Height:       Physical Exam  General: elderly female in bed in NAD   HEENT NCAT Neck: supple trachea midline Heart: Irregular; S1S2 no rub Lungs: Clear to auscultation; unlabored on room air  Abdomen: soft/nt/obese habitus Extremities: No edema lower extremities Psych normal mood and affect Neuro -alert and oriented x 3 provides hx and follows commands Dialysis Access: LUE AVF with bruit and thrill   OP HD:  [facility]  (will be going to MWF schedule at [facility] for SNF)   4h  350/500  87.2kg  2/2 bath  P2  Hep none   - hect 5 ug tiw   - venofer 50 q wk  - no esa     CXR 12/27 - IMPRESSION: Cardiomegaly with mild pulmonary interstitial edema and trace bilateral pleural effusions suggesting CHF.    CXR 12/29 - IMPRESSION: Cardiomegaly, pulmonary vascular congestion and increased hazy opacities since previous study which may represent increasing edema/infiltrate.  Assessment/Plan: ESRD - CRRT started 12/30 and stopped 1/1.  HD is now per a MWF schedule (per request of her new SNF).   AMS - delirium. Neuro consulted. resolved Acute resp insufficiency - extubated 12/30. +fever/ ^wbc/ pulm infiltrates. S/p broad spectrum IV abx w/ vanc/ zosyn. Extra volume removed w/ CRRT and was under EDW after that. Optimize volume with HD Hypotension - Increased midodrine back to 10 mg TID Paroxysmal Afib -  cardioverted in ED 12/27.  S/p IV amio as above and on amio gtt during last HD.  Per primary team and cardiology - appreciate assistance Anemia of CKD- Hb acceptable; defer ESA For now MBD ckd - Cont hectorol here. No binders on home med list and phos normal last check  Disposition - per primary team.    Additional Objective Labs: Basic Metabolic Panel: CBC: Recent Labs  Lab 02/24/21 0400 02/25/21 0608 02/26/21 0300 02/27/21 0159 02/28/21 0255  WBC 14.4* 14.5* 10.1 10.8* 10.3  NEUTROABS  --   --   --  7.3 7.3  HGB 11.1* 10.8* 10.6* 10.8* 11.1*  HCT 34.3* 35.3* 32.6* 34.2* 34.1*  MCV 98.6 100.6* 95.3 97.2 96.6  PLT 288 275 229 237 215   Recent Labs  Lab 02/27/21 1304 02/27/21 1633 02/27/21 2131 02/28/21 0731 02/28/21 1145  GLUCAP 140* 171* 250* 256* 276*   Iron Studies: No results for input(s): IRON, TIBC, TRANSFERRIN, FERRITIN in the last 72 hours. Lab Results  Component Value Date   INR 1.1 11/17/2018   Medications:  sodium chloride Stopped (02/25/21 1953)   amiodarone 60 mg/hr (02/28/21 1421)    apixaban  5 mg Oral BID   atorvastatin  80 mg Oral QPM   doxercalciferol  5 mcg Intravenous Q M,W,F-HD  feeding supplement (NEPRO CARB STEADY)  237 mL Oral TID WC   insulin aspart  0-9 Units Subcutaneous TID AC & HS   latanoprost  1 drop Both Eyes QHS   lidocaine  1 patch Transdermal Q24H   midodrine  10 mg Oral TID WC   mometasone-formoterol  2 puff Inhalation BID   multivitamin  1 tablet Oral QHS   pantoprazole  20 mg Oral BID   QUEtiapine  25 mg Oral QHS   sodium chloride flush  3 mL Intravenous Q12H    Claudia Desanctis, MD 02/28/2021 2:48 PM

## 2021-02-28 NOTE — Plan of Care (Signed)
  Problem: Cardiac: Goal: Ability to achieve and maintain adequate cardiopulmonary perfusion will improve Outcome: Progressing   

## 2021-02-28 NOTE — Progress Notes (Addendum)
Patient ID: Stephanie [facility], female   DOB: 04/14/1940, 81 y.o.   MRN: [id number]     Advanced Heart Failure Rounding Note  PCP-Cardiologist: Ezzard Standing, MD   Subjective:    12/29: Found unresponsive, no arrhythmias noted. Intubated. Started on NE.  MRI brain without acute process. 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  Had fever to 101F. Started on broad spectrum abx and pan cultures sent.  12/30: CVVH started. Extubated.  Echo reviewed: EF 30-35% range, worse function in septum; mild RV dysfunction, IVC dilated.  01/01: CRRT stopped 01/03: LHC showed no obstructive CAD. 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  1/5: Midodrine cut back to 5 mg tid.   SBP lower today.   Off CRRT. Back on iHD   Back in A fib RVR. Started on amio drip.   Rates improving   Denies SOB.  No CP       Objective:   Weight Range: 85.7 kg Body mass index is 33.47 kg/m.   Vital Signs:   Temp:  [97.7 F (36.5 C)-99.4 F (37.4 C)] 98.4 F (36.9 C) (01/07 0424) Pulse Rate:  [97-124] 100 (01/07 0809) Resp:  [15-20] 19 (01/07 0809) BP: (86-134)/(48-97) 111/67 (01/07 0534) SpO2:  [93 %-98 %] 97 % (01/07 0809) Weight:  [85.7 kg-86.5 kg] 85.7 kg (01/07 0429) Last BM Date: 02/26/21  Weight change: Filed Weights   02/27/21 0800 02/27/21 1230 02/28/21 0429  Weight: 87.1 kg 86.5 kg 85.7 kg    Intake/Output:   Intake/Output Summary (Last 24 hours) at 02/28/2021 1035 Last data filed at 02/28/2021 0430 Gross per 24 hour  Intake 1237.01 ml  Output 200 ml  Net 1037.01 ml       Physical Exam   General:   No resp difficulty HEENT: normal Neck: supple.  Bandage R neck  No JVD on left   Cor: Irreg irreg  No S3   nO murmurs  Lungs: clear anteriorly   Abdomen: soft, nontender,  nondistended. Extremities: no edema. LUE AVF Neuro: alert & orientedx3, cranial nerves grossly intact. moves all 4 extremities w/o difficulty. Affect pleasant  Telemetry   A fib 100s   Labs    CBC Recent Labs    02/27/21 0159 02/28/21 0255  WBC 10.8* 10.3  NEUTROABS 7.3 7.3  HGB 10.8* 11.1*  HCT 34.2* 34.1*  MCV 97.2 96.6  PLT 237 510   Basic Metabolic Panel Recent Labs    02/27/21 0159 02/28/21 0255  NA 136 135  K 3.4* 3.5  CL 100 98  CO2 25 28  GLUCOSE 206* 300*  BUN 25* 18  CREATININE 6.48* 4.47*  CALCIUM 9.3 9.1  MG 2.4 2.2   Liver Function Tests No results for input(s): AST, ALT, ALKPHOS, BILITOT, PROT, ALBUMIN in the last 72 hours.  No results for input(s): LIPASE, AMYLASE in the last 72 hours. Cardiac Enzymes No results for input(s): CKTOTAL, CKMB, CKMBINDEX, TROPONINI in the last 72 hours.  BNP: BNP (last 3 results) Recent Labs    06/08/20 1011  BNP 402.0*    ProBNP (last 3 results) No results for input(s): PROBNP in the last 8760 hours.   D-Dimer No results for input(s): DDIMER in the last 72 hours. Hemoglobin A1C No results for input(s): HGBA1C in the last 72 hours.  Fasting Lipid Panel No results for input(s): CHOL, HDL,  LDLCALC, TRIG, CHOLHDL, LDLDIRECT in the last 72 hours.  Thyroid Function Tests No results for input(s): TSH, T4TOTAL, T3FREE, THYROIDAB in the last 72 hours.  Invalid input(s): FREET3   Other results:   Imaging    No results found.   Medications:     Scheduled Medications:  apixaban  5 mg Oral BID   atorvastatin  80 mg Oral QPM   doxercalciferol  5 mcg Intravenous Q M,W,F-HD   feeding supplement (NEPRO CARB STEADY)  237 mL Oral TID WC   insulin aspart  0-9 Units Subcutaneous TID AC & HS   latanoprost  1 drop Both Eyes QHS   lidocaine  1 patch Transdermal Q24H   midodrine  10 mg Oral TID WC   mometasone-formoterol  2 puff Inhalation BID   multivitamin  1 tablet Oral QHS   pantoprazole  20 mg Oral  BID   QUEtiapine  25 mg Oral QHS   sodium chloride flush  3 mL Intravenous Q12H    Infusions:  sodium chloride Stopped (02/25/21 1953)   amiodarone 60 mg/hr (02/28/21 0827)    PRN Medications: sodium chloride, acetaminophen, guaiFENesin-dextromethorphan, hydrALAZINE, ipratropium-albuterol, ondansetron (ZOFRAN) IV, ondansetron **OR** [DISCONTINUED] ondansetron (ZOFRAN) IV, senna-docusate, traMADol   Assessment/Plan   1.  Atrial fibrillation: Known hx paroxysmal AF.  Seen in ED at [facility] the week PTA for AF and spontaneously converted to SR.  Presented 02/17/21 with AF with RVR. Cardioverted in ER.  She was in junctional rhythm after respiratory arrest and intubation with rate upper 40s-50s. Rhythm on 1/3 appeared to be wandering atrial pacemaker, and amiodarone stopped.   - Back in A fib  with RVR. Amio drip started   Now on IV   Rates improving   Would continue IV load   60 mg per hoursur.   - Continue Eliquis.  2. Acute systolic CHF: Echo in 2/97 with EF 50-55%, mild LVH, grade 2 diastolic dysfunction.  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  [place] 11/23/17 showed significantly elevated right and left heart filling pressures and severe primarily pulmonary venous hypertension. Cardiac output preserved. Echo (10/19) with EF 45-50%, septal-lateral dyssynchrony, RV looked ok.  PYP scan was not suggestive of transthyretin amyloidosis.  Echo in 1/21 with EF 55-60%, normal RV.  RHC/LHC in 4/21 with no significant CAD, normal filling pressures and cardiac output.   Echo this admission showed EF down to 35%.  HS-TnI 385 => 378, likely demand ischemia with hypotension/respiratory arrest.  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  - Would like eventual coronary CT  to make sure we are not missing an anomalous vessel with unusual coronary tree on cath.  Not on any meds due to BP being low, being on midodrine    3. Acute respiratory failure:  Minimally responsive 12/29 and intubated for airway protection.  Extubated 12/30, stable today. Possible aspiration event with baseline OHS. Chest x-ray with increasing LLL airspace disease. Trach aspirate with normal flora, BC negative.  Seen by SLP, dysphagia 3 diet recommended.  - Completed tx w/ ceftriaxone - Sats OK on room air 4. Acute encephalopathy: Head CT and MRI brain without acute process. Bilateral intracranial ICA stenosis (but no significant extracranial disease). EEG with no seizure activity. - Resolved.   5. ESRD: Followed by Dr. Hollie Salk as outpatient.  Volume status  optimized on CVVH (stopped 1/1).  - Transitioned back to iHD. Management per nephrology  - Increase midodrine to 10 mg tid.  6. Pulmonary HTN: Mild PH on 4/21 RHC.  7. Hyperlipidemia: on atorvastatin 80.  8. DM II: A1c 9.1 - Per primary team 9. ID: Elevated WBCs, fever on 12/29. WBCs now trending down. ?Aspiration PNA.  - Vanc/zosyn switched to ceftriaxone. Completed 7 day course     Dorris Carnes, NP-C  02/28/2021 10:35 AM

## 2021-02-28 NOTE — Progress Notes (Signed)
Progress Note    Stephanie Woodward   ION:[id number]  DOB: 09-10-1940  DOA: 02/17/2021     9 PCP: Stephanie Dresser, MD  Initial CC: SOB, palpitations   Hospital Course: Stephanie [facility] is a 81 yo female with PMH PAF, CHF, ESRD on HD, COPD, IDDM, gout, HLD, HTN, sleep apnea who initially presented to the hospital on 02/17/2021 with shortness of breath and palpitations.  She was found to be in A. fib with RVR.  She had further decreased level of responsiveness and ultimately required intubation for airway protection on 02/19/2021.  She then developed hypotension requiring vasopressor support.  She was able to be extubated on 02/20/2021 and was started on midodrine to help with Levophed weaning.  She was managed by cardiology as well. She underwent LHC on 02/24/21 which showed nonobstructive CAD although an abnormal coronary tree.   Interval History:  No events overnight.  Denies chest pain, palpitations, shortness of breath. Remains on amiodarone drip but heart rate is improved at this time.  Blood pressure also better after midodrine increased yesterday.  Assessment & Plan: * Paroxysmal atrial fibrillation with RVR ([place])- (present on admission) - Continue amiodarone and Eliquis - oral amio stopped on 1/5 as patient developed afib RVR again; did not respond to test dose of 2.5 mg IV lopressor and BP was already borderline, therefore cardiology started back on amio IV drip evening of 1/5. -Rate better on IV amiodarone currently  Acute on chronic combined systolic and diastolic CHF (congestive heart failure) ([place]) - new echo this admission: EF 25-30 %.  Grade 2 DD. Patient underwent LHC showing nonobstructive CAD but "unusual coronary tree" with small LAD and very large dominant RCA -Cardiology continues to follow -suspect still will need more med changes prior to d/c   Hypotension - Weaned off of Levophed while in the ICU however required midodrine to help with weaning.  Blood pressure has been  slowly improving, slightly hypertensive this morning - Midodrine weaned to 5 mg 3 times daily per cardiology but today developing more hypoTN again, dosing increased back to 10 mg TID -Blood pressure has improved since yesterday on midodrine increase  ESRD on hemodialysis ([place]) - Continue dialysis per nephrology  Acute respiratory failure with hypoxia (HCC)-resolved as of 02/26/2021 - Likely multifactorial in setting of CHF exacerbation initially - Has been weaned down to room air  Hyperlipidemia- (present on admission) - Continue Lipitor  Type 2 diabetes mellitus, controlled, with renal complications ([place])- (present on admission) - Ac 9.1% on 02/18/21 - CBGs rather labile on just sliding scale but with ESRD I'm worried about clearance and accumulation if using basal - will discuss her diet to make sure she's compliant and try and adjust regimen as necessary  - Continue SSI and CBG monitoring  COPD (chronic obstructive pulmonary disease) ([place])- (present on admission) - no s/s exac    Old records reviewed in assessment of this patient  Antimicrobials:   DVT prophylaxis: Eliquis  Code Status:   Code Status: Full Code  Disposition Plan: SNF Status is: Inpatient  Objective: Blood pressure (!) 119/53, pulse 94, temperature 98.9 F (37.2 C), temperature source Oral, resp. rate 18, height 5\' 3"  (1.6 m), weight 85.7 kg, SpO2 99 %.  Examination:  Physical Exam Constitutional:      Appearance: Normal appearance.  HENT:     Head: Normocephalic and atraumatic.     Mouth/Throat:     Mouth: Mucous membranes are moist.  Eyes:     Extraocular Movements:  Extraocular movements intact.  Cardiovascular:     Rate and Rhythm: Tachycardia present. Rhythm irregular.     Comments: Improved tachycardia Pulmonary:     Effort: Pulmonary effort is normal.     Breath sounds: Normal breath sounds.  Abdominal:     General: Bowel sounds are normal. There is no distension.     Palpations:  Abdomen is soft.     Tenderness: There is no abdominal tenderness.  Musculoskeletal:        General: Normal range of motion.     Cervical back: Normal range of motion and neck supple.  Skin:    General: Skin is warm and dry.  Neurological:     General: No focal deficit present.     Mental Status: She is alert.  Psychiatric:        Mood and Affect: Mood normal.        Behavior: Behavior normal.     Consultants:  Nephrology Cardiology  Procedures:    Data Reviewed: I have personally reviewed labs and imaging studies    LOS: 9 days  Time spent: Greater than 50% of the 35 minute visit was spent in counseling/coordination of care for the patient as laid out in the A&P.   Dwyane Dee, MD [facility] 02/28/2021, 2:54 PM

## 2021-03-01 DIAGNOSIS — I480 Paroxysmal atrial fibrillation: Secondary | ICD-10-CM | POA: Diagnosis not present

## 2021-03-01 LAB — CBC WITH DIFFERENTIAL/PLATELET
Abs Immature Granulocytes: 0.06 10*3/uL (ref 0.00–0.07)
Basophils Absolute: 0.1 10*3/uL (ref 0.0–0.1)
Basophils Relative: 1 %
Eosinophils Absolute: 0.2 10*3/uL (ref 0.0–0.5)
Eosinophils Relative: 2 %
HCT: 34.2 % — ABNORMAL LOW (ref 36.0–46.0)
Hemoglobin: 10.9 g/dL — ABNORMAL LOW (ref 12.0–15.0)
Immature Granulocytes: 1 %
Lymphocytes Relative: 15 %
Lymphs Abs: 1.7 10*3/uL (ref 0.7–4.0)
MCH: 31.1 pg (ref 26.0–34.0)
MCHC: 31.9 g/dL (ref 30.0–36.0)
MCV: 97.7 fL (ref 80.0–100.0)
Monocytes Absolute: 1.1 10*3/uL — ABNORMAL HIGH (ref 0.1–1.0)
Monocytes Relative: 10 %
Neutro Abs: 7.9 10*3/uL — ABNORMAL HIGH (ref 1.7–7.7)
Neutrophils Relative %: 71 %
Platelets: 217 10*3/uL (ref 150–400)
RBC: 3.5 MIL/uL — ABNORMAL LOW (ref 3.87–5.11)
RDW: 16 % — ABNORMAL HIGH (ref 11.5–15.5)
WBC: 11 10*3/uL — ABNORMAL HIGH (ref 4.0–10.5)
nRBC: 0.2 % (ref 0.0–0.2)

## 2021-03-01 LAB — RENAL FUNCTION PANEL
Albumin: 2.6 g/dL — ABNORMAL LOW (ref 3.5–5.0)
Anion gap: 15 (ref 5–15)
BUN: 25 mg/dL — ABNORMAL HIGH (ref 8–23)
CO2: 25 mmol/L (ref 22–32)
Calcium: 9.4 mg/dL (ref 8.9–10.3)
Chloride: 94 mmol/L — ABNORMAL LOW (ref 98–111)
Creatinine, Ser: 6.32 mg/dL — ABNORMAL HIGH (ref 0.44–1.00)
GFR, Estimated: 6 mL/min — ABNORMAL LOW (ref >60)
Glucose, Bld: 155 mg/dL — ABNORMAL HIGH (ref 70–99)
Phosphorus: 4.2 mg/dL (ref 2.5–4.6)
Potassium: 3.3 mmol/L — ABNORMAL LOW (ref 3.5–5.1)
Sodium: 134 mmol/L — ABNORMAL LOW (ref 135–145)

## 2021-03-01 LAB — GLUCOSE, CAPILLARY
Glucose-Capillary: 187 mg/dL — ABNORMAL HIGH (ref 70–99)
Glucose-Capillary: 259 mg/dL — ABNORMAL HIGH (ref 70–99)
Glucose-Capillary: 210 mg/dL — ABNORMAL HIGH (ref 70–99)
Glucose-Capillary: 163 mg/dL — ABNORMAL HIGH (ref 70–99)

## 2021-03-01 LAB — MAGNESIUM: Magnesium: 2.2 mg/dL (ref 1.7–2.4)

## 2021-03-01 MED ORDER — CHLORHEXIDINE GLUCONATE CLOTH 2 % EX PADS
6.0000 | MEDICATED_PAD | Freq: Every day | CUTANEOUS | Status: DC
  Administered 2021-03-02 – 2021-03-05 (×4): 6 via TOPICAL

## 2021-03-01 MED ORDER — POTASSIUM CHLORIDE CRYS ER 10 MEQ PO TBCR
10.0000 meq | EXTENDED_RELEASE_TABLET | Freq: Once | ORAL | Status: AC
  Administered 2021-03-01: 10 meq via ORAL
  Filled 2021-03-01: qty 1

## 2021-03-01 NOTE — Plan of Care (Signed)
°  Problem: Activity: Goal: Capacity to carry out activities will improve Outcome: Progressing   Problem: Cardiac: Goal: Ability to achieve and maintain adequate cardiopulmonary perfusion will improve Outcome: Progressing   Problem: Education: Goal: Knowledge of General Education information will improve Description: Including pain rating scale, medication(s)/side effects and non-pharmacologic comfort measures Outcome: Progressing   Problem: Clinical Measurements: Goal: Respiratory complications will improve Outcome: Progressing Goal: Cardiovascular complication will be avoided Outcome: Progressing

## 2021-03-01 NOTE — Progress Notes (Signed)
Progress Note    Stephanie Woodward   HQI:[id number]  DOB: 1940/09/22  DOA: 02/17/2021     10 PCP: Larey Dresser, MD  Initial CC: SOB, palpitations   Hospital Course: Stephanie Woodward is a 81 yo female with PMH PAF, CHF, ESRD on HD, COPD, IDDM, gout, HLD, HTN, sleep apnea who initially presented to the hospital on 02/17/2021 with shortness of breath and palpitations.  She was found to be in A. fib with RVR.  She had further decreased level of responsiveness and ultimately required intubation for airway protection on 02/19/2021.  She then developed hypotension requiring vasopressor support.  She was able to be extubated on 02/20/2021 and was started on midodrine to help with Levophed weaning.  She was managed by cardiology as well. She underwent LHC on 02/24/21 which showed nonobstructive CAD although an abnormal coronary tree.   Interval History:  No events overnight.  Sitting up in bed this morning and is comfortable.  Denies any chest pain, palpitations, shortness of breath.  Assessment & Plan: * Paroxysmal atrial fibrillation with RVR ([place])- (present on admission) - Continue amiodarone and Eliquis - oral amio stopped on 1/5 as patient developed afib RVR again; did not respond to test dose of 2.5 mg IV lopressor and BP was already borderline, therefore cardiology started back on amio IV drip evening of 1/5. -Rate better on IV amiodarone currently  Acute on chronic combined systolic and diastolic CHF (congestive heart failure) ([place]) - new echo this admission: EF 25-30 %.  Grade 2 DD. Patient underwent LHC showing nonobstructive CAD but "unusual coronary tree" with small LAD and very large dominant RCA -Cardiology continues to follow -suspect still will need more med changes prior to d/c   Hypotension - Weaned off of Levophed while in the ICU however required midodrine to help with weaning.  Blood pressure has been slowly improving, slightly hypertensive this morning - Midodrine weaned to 5  mg 3 times daily per cardiology but developed more hypoTN again, dosing increased back to 10 mg TID -Blood pressure has improved but borderline low, thus unable to wean midodrine currently  ESRD on hemodialysis ([place]) - Continue dialysis per nephrology  Acute respiratory failure with hypoxia (HCC)-resolved as of 02/26/2021 - Likely multifactorial in setting of CHF exacerbation initially - Has been weaned down to room air  Hyperlipidemia- (present on admission) - Continue Lipitor  Type 2 diabetes mellitus, controlled, with renal complications ([place])- (present on admission) - Ac 9.1% on 02/18/21 - CBGs rather labile on just sliding scale but with ESRD I'm worried about clearance and accumulation if using basal - will discuss her diet to make sure she's compliant and try and adjust regimen as necessary  - Continue SSI and CBG monitoring  COPD (chronic obstructive pulmonary disease) ([place])- (present on admission) - no s/s exac    Old records reviewed in assessment of this patient  Antimicrobials:   DVT prophylaxis: Eliquis  Code Status:   Code Status: Full Code  Disposition Plan: SNF Status is: Inpatient  Objective: Blood pressure 140/74, pulse (!) 106, temperature 99 F (37.2 C), temperature source Oral, resp. rate 16, height 5\' 3"  (1.6 m), weight 85.7 kg, SpO2 100 %.  Examination:  Physical Exam Constitutional:      Appearance: Normal appearance.  HENT:     Head: Normocephalic and atraumatic.     Mouth/Throat:     Mouth: Mucous membranes are moist.  Eyes:     Extraocular Movements: Extraocular movements intact.  Cardiovascular:  Rate and Rhythm: Tachycardia present. Rhythm irregular.     Comments: Improved tachycardia Pulmonary:     Effort: Pulmonary effort is normal.     Breath sounds: Normal breath sounds.  Abdominal:     General: Bowel sounds are normal. There is no distension.     Palpations: Abdomen is soft.     Tenderness: There is no abdominal tenderness.   Musculoskeletal:        General: Normal range of motion.     Cervical back: Normal range of motion and neck supple.  Skin:    General: Skin is warm and dry.  Neurological:     General: No focal deficit present.     Mental Status: She is alert.  Psychiatric:        Mood and Affect: Mood normal.        Behavior: Behavior normal.     Consultants:  Nephrology Cardiology  Procedures:    Data Reviewed: I have personally reviewed labs and imaging studies    LOS: 10 days  Time spent: Greater than 50% of the 35 minute visit was spent in counseling/coordination of care for the patient as laid out in the A&P.   Dwyane Dee, MD [facility] 03/01/2021, 1:43 PM

## 2021-03-01 NOTE — Progress Notes (Signed)
[facility] Progress Note   Subjective:    Last HD on 1/6 complicated by afib with RVR managed with amio; due to hypotension she only had 200 mL UF.  Her daughter is at bedside.  She feels ok today.  Has an emesis bag but states this is for spitting up when she infrequently coughs.   Review of systems:  Denies shortness of breath  Denies chest pain Denies n/v - eating lunch   Objective Vitals:   03/01/21 0500 03/01/21 0800 03/01/21 0842 03/01/21 0900  BP: 99/68   140/74  Pulse: 93   (!) 106  Resp: 16 17  16   Temp: 98.4 F (36.9 C)   99 F (37.2 C)  TempSrc: Oral   Oral  SpO2: 92%  98% 100%  Weight:      Height:       Physical Exam  General: elderly female in bed in NAD   HEENT NCAT Neck: supple trachea midline Heart: Irregular; S1S2 no rub Lungs: Clear to auscultation; unlabored on room air  Abdomen: soft/nt/obese habitus Extremities: No edema lower extremities Psych normal mood and affect Neuro -alert and oriented x 3 provides hx and follows commands Dialysis Access: LUE AVF with bruit and thrill   OP HD:  [facility]  (will be going to MWF schedule at [facility] for SNF)   4h  350/500  87.2kg  2/2 bath  P2  Hep none   - hect 5 ug tiw   - venofer 50 q wk  - no esa     CXR 12/27 - IMPRESSION: Cardiomegaly with mild pulmonary interstitial edema and trace bilateral pleural effusions suggesting CHF.    CXR 12/29 - IMPRESSION: Cardiomegaly, pulmonary vascular congestion and increased hazy opacities since previous study which may represent increasing edema/infiltrate.  Assessment/Plan: ESRD - CRRT started 12/30 and stopped 1/1.  HD is now per a MWF schedule (per request of her new SNF).   AMS - delirium. Neuro consulted. resolved Acute resp insufficiency - extubated 12/30. +fever/ ^wbc/ pulm infiltrates. S/p broad spectrum IV abx w/ vanc/ zosyn. Extra volume removed w/ CRRT and was under EDW after that. Optimize volume with HD Hypotension - Increased midodrine  back to 10 mg TID.  May ultimately need to plan to decrease to midodrine prior to HD Paroxysmal Afib - cardioverted in ED 12/27.  S/p IV amio as above and on amio gtt during last HD.  Per primary team and cardiology - appreciate assistance Anemia of CKD- Hb acceptable; defer ESA For now but anticipate need soon for ESA MBD ckd - Cont hectorol here. No binders on home med list and phos at goal  Disposition - per primary team.    Additional Objective Labs: Basic Metabolic Panel: CBC: Recent Labs  Lab 02/25/21 0608 02/26/21 0300 02/27/21 0159 02/28/21 0255 03/01/21 0318  WBC 14.5* 10.1 10.8* 10.3 11.0*  NEUTROABS  --   --  7.3 7.3 7.9*  HGB 10.8* 10.6* 10.8* 11.1* 10.9*  HCT 35.3* 32.6* 34.2* 34.1* 34.2*  MCV 100.6* 95.3 97.2 96.6 97.7  PLT 275 229 237 215 217   Recent Labs  Lab 02/28/21 1145 02/28/21 1614 02/28/21 2159 03/01/21 0749 03/01/21 1157  GLUCAP 276* 265* 179* 187* 259*   Iron Studies: No results for input(s): IRON, TIBC, TRANSFERRIN, FERRITIN in the last 72 hours. Lab Results  Component Value Date   INR 1.1 11/17/2018   Medications:  sodium chloride Stopped (02/25/21 1953)   amiodarone 60 mg/hr (03/01/21 0829)  apixaban  5 mg Oral BID   atorvastatin  80 mg Oral QPM   doxercalciferol  5 mcg Intravenous Q M,W,F-HD   feeding supplement (NEPRO CARB STEADY)  237 mL Oral TID WC   insulin aspart  0-9 Units Subcutaneous TID AC & HS   latanoprost  1 drop Both Eyes QHS   lidocaine  1 patch Transdermal Q24H   midodrine  10 mg Oral TID WC   mometasone-formoterol  2 puff Inhalation BID   multivitamin  1 tablet Oral QHS   pantoprazole  20 mg Oral BID   QUEtiapine  25 mg Oral QHS   sodium chloride flush  3 mL Intravenous Q12H    Claudia Desanctis, MD 03/01/2021 2:23 PM

## 2021-03-01 NOTE — Progress Notes (Incomplete)
CBG = 259. Value has not transferred to Epic at this time.

## 2021-03-01 NOTE — Progress Notes (Signed)
Patient ID: Stephanie Woodward, female   DOB: 02-15-1941, 81 y.o.   MRN: [id number]     Advanced Heart Failure Rounding Note  PCP-Cardiologist: Ezzard Standing, MD   Subjective:    12/29: Found unresponsive, no arrhythmias noted. Intubated. Started on NE.  MRI brain without acute process. 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  Had fever to 101F. Started on broad spectrum abx and pan cultures sent.  12/30: CVVH started. Extubated.  Echo reviewed: EF 30-35% range, worse function in septum; mild RV dysfunction, IVC dilated.  01/01: CRRT stopped 01/03: LHC showed no obstructive CAD. 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  1/5: Midodrine cut back to 5 mg tid.   SBP lower today.   Off CRRT. Back on iHD   Back in Afib    Intermitt RVR   Getting amiodarone load (60 mg per hour continuous)  Currently says she is breathing OK  Sitting in bed   No CP  No dizziness   Butt is sore     Objective:   Weight Range: 85.7 kg Body mass index is 33.47 kg/m.   Vital Signs:   Temp:  [97.5 F (36.4 C)-98.9 F (37.2 C)] 98.4 F (36.9 C) (01/08 0500) Pulse Rate:  [93-102] 93 (01/08 0500) Resp:  [16-26] 16 (01/08 0500) BP: (99-148)/(53-87) 99/68 (01/08 0500) SpO2:  [92 %-100 %] 92 % (01/08 0500) Last BM Date: 02/26/21  Weight change: Filed Weights   02/27/21 0800 02/27/21 1230 02/28/21 0429  Weight: 87.1 kg 86.5 kg 85.7 kg    Intake/Output:   Intake/Output Summary (Last 24 hours) at 03/01/2021 0758 Last data filed at 03/01/2021 0005 Gross per 24 hour  Intake 761 ml  Output --  Net 761 ml       Physical Exam   General:   No resp difficulty HEENT: normal Neck: supple.  Bandage R neck  No JVD on left   Cor: Irreg irreg  No S3   No murmurs   Lungs: clear anteriorly   Abdomen: soft,  nontender, nondistended. Extremities: no edema. LUE AVF Neuro: alert & orientedx3, cranial nerves grossly intact. moves all 4 extremities w/o difficulty. Affect pleasant  Telemetry   A fib 100s   Labs    CBC Recent Labs    02/28/21 0255 03/01/21 0318  WBC 10.3 11.0*  NEUTROABS 7.3 7.9*  HGB 11.1* 10.9*  HCT 34.1* 34.2*  MCV 96.6 97.7  PLT 215 354   Basic Metabolic Panel Recent Labs    02/28/21 0255 03/01/21 0318  NA 135 134*  K 3.5 3.3*  CL 98 94*  CO2 28 25  GLUCOSE 300* 155*  BUN 18 25*  CREATININE 4.47* 6.32*  CALCIUM 9.1 9.4  MG 2.2 2.2  PHOS  --  4.2   Liver Function Tests Recent Labs    03/01/21 0318  ALBUMIN 2.6*    No results for input(s): LIPASE, AMYLASE in the last 72 hours. Cardiac Enzymes No results for input(s): CKTOTAL, CKMB, CKMBINDEX, TROPONINI in the last 72 hours.  BNP: BNP (last 3 results) Recent Labs    06/08/20 1011  BNP 402.0*    ProBNP (last 3 results) No results for input(s): PROBNP in the last 8760 hours.   D-Dimer No results for input(s): DDIMER in the last 72 hours. Hemoglobin A1C No results for input(s): HGBA1C in the  last 72 hours.  Fasting Lipid Panel No results for input(s): CHOL, HDL, LDLCALC, TRIG, CHOLHDL, LDLDIRECT in the last 72 hours.  Thyroid Function Tests No results for input(s): TSH, T4TOTAL, T3FREE, THYROIDAB in the last 72 hours.  Invalid input(s): FREET3   Other results:   Imaging    No results found.   Medications:     Scheduled Medications:  apixaban  5 mg Oral BID   atorvastatin  80 mg Oral QPM   doxercalciferol  5 mcg Intravenous Q M,W,F-HD   feeding supplement (NEPRO CARB STEADY)  237 mL Oral TID WC   insulin aspart  0-9 Units Subcutaneous TID AC & HS   latanoprost  1 drop Both Eyes QHS   lidocaine  1 patch Transdermal Q24H   midodrine  10 mg Oral TID WC   mometasone-formoterol  2 puff Inhalation BID   multivitamin  1 tablet Oral QHS   pantoprazole  20 mg Oral BID    QUEtiapine  25 mg Oral QHS   sodium chloride flush  3 mL Intravenous Q12H    Infusions:  sodium chloride Stopped (02/25/21 1953)   amiodarone 60 mg/hr (03/01/21 0240)    PRN Medications: sodium chloride, acetaminophen, guaiFENesin-dextromethorphan, hydrALAZINE, ipratropium-albuterol, ondansetron (ZOFRAN) IV, ondansetron **OR** [DISCONTINUED] ondansetron (ZOFRAN) IV, senna-docusate, traMADol   Assessment/Plan   1.  Atrial fibrillation: Known hx paroxysmal AF.  Seen in ED at [facility] the week PTA for AF and spontaneously converted to SR.  Presented 02/17/21 with AF with RVR. Cardioverted in ER.  She was in junctional rhythm after respiratory arrest and intubation with rate upper 40s-50s. Rhythm on 1/3 appeared to be wandering atrial pacemaker, and amiodarone stopped.   - Back in A fib  with RVR. Has been getting IV amio 60 mg/hr for a few days   Contniue    BP OK on midodrine   - Continue Eliquis.    2. Acute systolic CHF: Echo in 2/70 with EF 50-55%, mild LVH, grade 2 diastolic dysfunction.  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  [facility] 11/23/17 showed significantly elevated right and left heart filling pressures and severe primarily pulmonary venous hypertension. Cardiac output preserved. Echo (10/19) with EF 45-50%, septal-lateral dyssynchrony, RV looked ok.  PYP scan was not suggestive of transthyretin amyloidosis.  Echo in 1/21 with EF 55-60%, normal RV.  RHC/LHC in 4/21 with no significant CAD, normal filling pressures and cardiac output.   Echo this admission showed EF down to 35%.  HS-TnI 385 => 378, likely demand ischemia with hypotension/respiratory arrest.  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  - Would like eventual coronary CT to make sure  we are not missing an anomalous vessel with unusual coronary tree on cath.  Not on any meds due to BP being low, being on midodrine    3. Acute respiratory failure:  Minimally responsive 12/29 and intubated for airway protection.  Extubated 12/30, stable today. Possible aspiration event with baseline OHS. Chest x-ray with increasing LLL airspace disease. Trach aspirate with normal flora, BC negative.  Seen by SLP, dysphagia 3 diet recommended.  - Completed tx w/ ceftriaxone Doing better Comfortable at rest and sats OK on RA    Overall volume does not appear bad     5. ESRD: Followed by Dr. Hollie Salk as outpatient.  Volume status  optimized on CVVH (stopped 1/1).  - Transitioned back to iHD. Management per nephrology  - Increase midodrine to 10 mg tid.   6. Pulmonary HTN: Mild PH on 4/21 RHC.   7. Hyperlipidemia: on atorvastatin 80.   8. DM II: A1c 9.1 - Per primary team    Dorris Carnes 03/01/2021 7:58 AM

## 2021-03-02 DIAGNOSIS — I480 Paroxysmal atrial fibrillation: Secondary | ICD-10-CM | POA: Diagnosis not present

## 2021-03-02 LAB — RENAL FUNCTION PANEL
Albumin: 2.5 g/dL — ABNORMAL LOW (ref 3.5–5.0)
Anion gap: 16 — ABNORMAL HIGH (ref 5–15)
BUN: 31 mg/dL — ABNORMAL HIGH (ref 8–23)
CO2: 25 mmol/L (ref 22–32)
Calcium: 9.3 mg/dL (ref 8.9–10.3)
Chloride: 94 mmol/L — ABNORMAL LOW (ref 98–111)
Creatinine, Ser: 7.84 mg/dL — ABNORMAL HIGH (ref 0.44–1.00)
GFR, Estimated: 5 mL/min — ABNORMAL LOW (ref >60)
Glucose, Bld: 134 mg/dL — ABNORMAL HIGH (ref 70–99)
Phosphorus: 4.8 mg/dL — ABNORMAL HIGH (ref 2.5–4.6)
Potassium: 3.5 mmol/L (ref 3.5–5.1)
Sodium: 135 mmol/L (ref 135–145)

## 2021-03-02 LAB — PROTIME-INR
INR: 1.7 — ABNORMAL HIGH (ref 0.8–1.2)
Prothrombin Time: 19.7 seconds — ABNORMAL HIGH (ref 11.4–15.2)

## 2021-03-02 LAB — CBC WITH DIFFERENTIAL/PLATELET
Abs Immature Granulocytes: 0.05 10*3/uL (ref 0.00–0.07)
Basophils Absolute: 0.1 10*3/uL (ref 0.0–0.1)
Basophils Relative: 1 %
Eosinophils Absolute: 0.2 10*3/uL (ref 0.0–0.5)
Eosinophils Relative: 2 %
HCT: 32.8 % — ABNORMAL LOW (ref 36.0–46.0)
Hemoglobin: 10.8 g/dL — ABNORMAL LOW (ref 12.0–15.0)
Immature Granulocytes: 1 %
Lymphocytes Relative: 17 %
Lymphs Abs: 1.9 10*3/uL (ref 0.7–4.0)
MCH: 31.6 pg (ref 26.0–34.0)
MCHC: 32.9 g/dL (ref 30.0–36.0)
MCV: 95.9 fL (ref 80.0–100.0)
Monocytes Absolute: 1 10*3/uL (ref 0.1–1.0)
Monocytes Relative: 9 %
Neutro Abs: 7.7 10*3/uL (ref 1.7–7.7)
Neutrophils Relative %: 70 %
Platelets: 207 10*3/uL (ref 150–400)
RBC: 3.42 MIL/uL — ABNORMAL LOW (ref 3.87–5.11)
RDW: 15.7 % — ABNORMAL HIGH (ref 11.5–15.5)
WBC: 10.9 10*3/uL — ABNORMAL HIGH (ref 4.0–10.5)
nRBC: 0.2 % (ref 0.0–0.2)

## 2021-03-02 LAB — GLUCOSE, CAPILLARY
Glucose-Capillary: 142 mg/dL — ABNORMAL HIGH (ref 70–99)
Glucose-Capillary: 117 mg/dL — ABNORMAL HIGH (ref 70–99)
Glucose-Capillary: 251 mg/dL — ABNORMAL HIGH (ref 70–99)
Glucose-Capillary: 275 mg/dL — ABNORMAL HIGH (ref 70–99)

## 2021-03-02 LAB — MAGNESIUM: Magnesium: 2.1 mg/dL (ref 1.7–2.4)

## 2021-03-02 MED ORDER — SODIUM CHLORIDE 0.9 % IV SOLN: INTRAVENOUS | Status: DC

## 2021-03-02 NOTE — Progress Notes (Signed)
Physical Therapy Treatment Patient Details Name: Stephanie Woodward MRN: [id number] DOB: 08-30-1940 Today's Date: 03/02/2021   History of Present Illness Pt adm 12/27 with SOB and found to be in afib with RVR. On 12/28 pt found unresponsive and transferred to ICU and intubated.  Pt placed on CRRT on 12/29. Head CT and MRI negative. Extubated 12/30. PMH - ESRD on HD, afib, chf, DM, copd, gout, chronic back pain    PT Comments    Pt agreeable to working with therapy on walking, limited in safe mobility by increased runs of tachycardia, and increased fatigue after HD. Pt is modA for bed mobility, and modAx2 for power up to standing, min A for ambulation with close chair follow. Pt initially ambulated with Rollator however pt with difficulty keeping appropriate proximity with Rollator rolling away, improved with use of RW. D/c plans remain appropriate at this time. PT will continue to follow acutely.    Recommendations for follow up therapy are one component of a multi-disciplinary discharge planning process, led by the attending physician.  Recommendations may be updated based on patient status, additional functional criteria and insurance authorization.  Follow Up Recommendations  Skilled nursing-short term rehab (<3 hours/day)     Assistance Recommended at Discharge Frequent or constant Supervision/Assistance  Patient can return home with the following     Equipment Recommendations  Wheelchair (measurements PT);Wheelchair cushion (measurements PT)    Recommendations for Other Services       Precautions / Restrictions Precautions Precautions: Fall Restrictions Weight Bearing Restrictions: No     Mobility  Bed Mobility Overal bed mobility: Needs Assistance       Supine to sit: Mod assist;Max assist     General bed mobility comments: pt able to manage LE off bed requires modA and increased time to bring trunk to upright and maxAx2 for scooting hips to EoB    Transfers Overall  transfer level: Needs assistance Equipment used: Rollator (4 wheels) Transfers: Sit to/from Stand Sit to Stand: Mod assist;+2 physical assistance;+2 safety/equipment           General transfer comment: decreased power up requiring modAx2 for bringing hips up and forward    Ambulation/Gait Ambulation/Gait assistance: Min assist;+2 safety/equipment Gait Distance (Feet): 10 Feet (+6) Assistive device: Rollator (4 wheels) Gait Pattern/deviations: Step-to pattern;Decreased step length - right;Decreased step length - left;Shuffle;Trunk flexed Gait velocity: decr Gait velocity interpretation: <1.31 ft/sec, indicative of household ambulator   General Gait Details: min A for steadying, with close chair follow, Rollator rolls to far forward and pt with difficulty getting back under her, with second bout of ambulation utilized RW with increased safety, limited by tachycardia and fatigue       Balance Overall balance assessment: Needs assistance Sitting-balance support: No upper extremity supported;Feet unsupported Sitting balance-Leahy Scale: Fair     Standing balance support: Bilateral upper extremity supported Standing balance-Leahy Scale: Poor Standing balance comment: walker and min guard for static standing                            Cognition Arousal/Alertness: Awake/alert Behavior During Therapy: Flat affect Overall Cognitive Status: Impaired/Different from baseline Area of Impairment: Following commands;Safety/judgement;Awareness;Problem solving                       Following Commands: Follows one step commands with increased time;Follows one step commands consistently Safety/Judgement: Decreased awareness of deficits Awareness: Emergent Problem Solving: Slow processing;Requires verbal cues General  Comments: continues to require increased cuing and reassurance           General Comments General comments (skin integrity, edema, etc.): Afib with  amio IV drip, HR in high 140s with ambulation.      Pertinent Vitals/Pain Pain Assessment: No/denies pain     PT Goals (current goals can now be found in the care plan section) Acute Rehab PT Goals PT Goal Formulation: With patient Time For Goal Achievement: 03/07/21 Potential to Achieve Goals: Fair Progress towards PT goals: Progressing toward goals    Frequency    Min 3X/week      PT Plan Current plan remains appropriate       AM-PAC PT "6 Clicks" Mobility   Outcome Measure  Help needed turning from your back to your side while in a flat bed without using bedrails?: A Lot Help needed moving from lying on your back to sitting on the side of a flat bed without using bedrails?: A Lot Help needed moving to and from a bed to a chair (including a wheelchair)?: A Lot Help needed standing up from a chair using your arms (e.g., wheelchair or bedside chair)?: A Lot Help needed to walk in hospital room?: Total Help needed climbing 3-5 steps with a railing? : Total 6 Click Score: 10    End of Session Equipment Utilized During Treatment: Gait belt Activity Tolerance: Patient tolerated treatment well Patient left: in chair;with call bell/phone within reach;with family/visitor present Nurse Communication: Mobility status PT Visit Diagnosis: Other abnormalities of gait and mobility (R26.89);Muscle weakness (generalized) (M62.81);Difficulty in walking, not elsewhere classified (R26.2)     Time: 5176-1607 PT Time Calculation (min) (ACUTE ONLY): 27 min  Charges:  $Gait Training: 8-22 mins $Therapeutic Activity: 8-22 mins                     Kassem Kibbe [name] PT, DPT Acute Rehabilitation Services Pager [phone] Office [phone]    [place] 03/02/2021, 4:40 PM

## 2021-03-02 NOTE — Progress Notes (Signed)
Nutrition Follow-up  DOCUMENTATION CODES:   Non-severe (moderate) malnutrition in context of chronic illness  INTERVENTION:   Continue Nepro Shake po TID, each supplement provides 425 kcal and 19 grams protein.  Continue Renal MVI daily.  NUTRITION DIAGNOSIS:   Moderate Malnutrition related to chronic illness (ESRD) as evidenced by mild fat depletion, mild muscle depletion.  Ongoing   GOAL:   Patient will meet greater than or equal to 90% of their needs  Progressing  MONITOR:   TF tolerance, Labs, Weight trends, Vent status  REASON FOR ASSESSMENT:   Ventilator    ASSESSMENT:   81 yo female admitted on 12/27 with afib with RVR, acute on chronic CHF/pulmonary edema. Pt found unresponsive on 12/29 requiring intubation. PMH includes ESRD on HD, CHF, DM, COPD, HLD  Patient says she is eating okay.  She had HD this morning and states everything went well. She likes the butter pecan Nepro shakes; drinks 1-2 supplements per day.   Remains on a dysphagia 3 diet with thin liquids.  Meal intakes: 25-50% She is being offered Nepro shakes TID.  Patient will be NPO after midnight for DCCV tomorrow.  Next HD scheduled 1/11.   EDW 87.2 kg; current wt 85.5 kg; lowest weight since admission 81.6 kg (1/2)  Labs reviewed. Phos 4.8 at goal CBG: 142-117  Medications reviewed and include Hectorol, Novolog, Rena-vit, Protonix.  Diet Order:   Diet Order             Diet NPO time specified  Diet effective midnight           DIET DYS 3 Room service appropriate? Yes; Fluid consistency: Thin  Diet effective now                   EDUCATION NEEDS:   Not appropriate for education at this time  Skin:  Skin Assessment: Reviewed RN Assessment  Last BM:  1/8  Height:   Ht Readings from Last 1 Encounters:  02/26/21 5\' 3"  (1.6 m)    Weight:   Wt Readings from Last 1 Encounters:  03/02/21 85.5 kg    BMI:  Body mass index is 33.39 kg/m.  Estimated Nutritional  Needs:   Kcal:  1600-1800 kcals  Protein:  105-130 g  Fluid:  1000 mL plus UOP   Lucas Mallow RD, LDN, CNSC Please refer to Amion for contact information.

## 2021-03-02 NOTE — H&P (View-Only) (Signed)
Patient ID: Stephanie Woodward, female   DOB: 10-23-1940, 81 y.o.   MRN: [id number]     Advanced Heart Failure Rounding Note  PCP-Cardiologist: Ezzard Standing, MD   Subjective:    12/29: Found unresponsive, no arrhythmias noted. Intubated. Started on NE.  MRI brain without acute process. 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  Had fever to 101F. Started on broad spectrum abx and pan cultures sent.  12/30: CVVH started. Extubated.  Echo reviewed: EF 30-35% range, worse function in septum; mild RV dysfunction, IVC dilated.  01/01: CRRT stopped 01/03: LHC showed no obstructive CAD. 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  1/5: Midodrine cut back to 5 mg tid. 1/6: Back in A fib RVR. IV amio restarted. Midodrine increased to 10 mg tid.   Remains in A fib on amio 60 mg per hour.     Feels ok. Denies SOB.   Objective:   Weight Range: 87.2 kg Body mass index is 34.05 kg/m.   Vital Signs:   Temp:  [98.2 F (36.8 C)-99 F (37.2 C)] 98.2 F (36.8 C) (01/09 0500) Pulse Rate:  [85-106] 85 (01/09 0500) Resp:  [11-18] 18 (01/09 0500) BP: (120-140)/(50-82) 120/50 (01/09 0500) SpO2:  [98 %-100 %] 98 % (01/08 1420) Weight:  [87.2 kg] 87.2 kg (01/09 0500) Last BM Date: 03/01/21  Weight change: Filed Weights   02/27/21 1230 02/28/21 0429 03/02/21 0500  Weight: 86.5 kg 85.7 kg 87.2 kg    Intake/Output:   Intake/Output Summary (Last 24 hours) at 03/02/2021 0715 Last data filed at 03/02/2021 0100 Gross per 24 hour  Intake 1242.9 ml  Output --  Net 1242.9 ml       Physical Exam   General:  In bed. No resp difficulty HEENT: normal Neck: supple. no JVD. Carotids 2+ bilat; no bruits. No lymphadenopathy or thryomegaly appreciated. Cor: PMI nondisplaced. Irregular rate & rhythm. No  rubs, gallops or murmurs. Lungs: clear Abdomen: soft, nontender, nondistended. No hepatosplenomegaly. No bruits or masses. Good bowel sounds. Extremities: no cyanosis, clubbing, rash, edema. LUE AVF Neuro: alert & orientedx3, cranial nerves grossly intact. moves all 4 extremities w/o difficulty. Affect pleasant  Telemetry  A fib 80-100s personally reviewed.    Labs    CBC Recent Labs    03/01/21 0318 03/02/21 0321  WBC 11.0* 10.9*  NEUTROABS 7.9* 7.7  HGB 10.9* 10.8*  HCT 34.2* 32.8*  MCV 97.7 95.9  PLT 217 035   Basic Metabolic Panel Recent Labs    03/01/21 0318 03/02/21 0321  NA 134* 135  K 3.3* 3.5  CL 94* 94*  CO2 25 25  GLUCOSE 155* 134*  BUN 25* 31*  CREATININE 6.32* 7.84*  CALCIUM 9.4 9.3  MG 2.2 2.1  PHOS 4.2 4.8*   Liver Function Tests Recent Labs    03/01/21 0318 03/02/21 0321  ALBUMIN 2.6* 2.5*    No results for input(s): LIPASE, AMYLASE in the last 72 hours. Cardiac Enzymes No results for input(s): CKTOTAL, CKMB, CKMBINDEX, TROPONINI in the last 72 hours.  BNP: BNP (last 3 results) Recent Labs    06/08/20 1011  BNP 402.0*    ProBNP (last 3 results) No results for input(s): PROBNP in the last 8760 hours.   D-Dimer No results for input(s): DDIMER in the last 72 hours. Hemoglobin A1C No results for input(s): HGBA1C in the  last 72 hours.  Fasting Lipid Panel No results for input(s): CHOL, HDL, LDLCALC, TRIG, CHOLHDL, LDLDIRECT in the last 72 hours.  Thyroid Function Tests No results for input(s): TSH, T4TOTAL, T3FREE, THYROIDAB in the last 72 hours.  Invalid input(s): FREET3   Other results:   Imaging    No results found.   Medications:     Scheduled Medications:  apixaban  5 mg Oral BID   atorvastatin  80 mg Oral QPM   Chlorhexidine Gluconate Cloth  6 each Topical Q0600   doxercalciferol  5 mcg Intravenous Q M,W,F-HD   feeding supplement (NEPRO CARB STEADY)  237 mL Oral TID WC   insulin aspart  0-9 Units  Subcutaneous TID AC & HS   latanoprost  1 drop Both Eyes QHS   lidocaine  1 patch Transdermal Q24H   midodrine  10 mg Oral TID WC   mometasone-formoterol  2 puff Inhalation BID   multivitamin  1 tablet Oral QHS   pantoprazole  20 mg Oral BID   QUEtiapine  25 mg Oral QHS   sodium chloride flush  3 mL Intravenous Q12H    Infusions:  sodium chloride Stopped (02/25/21 1953)   amiodarone 60 mg/hr (03/02/21 0430)    PRN Medications: sodium chloride, acetaminophen, guaiFENesin-dextromethorphan, hydrALAZINE, ipratropium-albuterol, ondansetron (ZOFRAN) IV, ondansetron **OR** [DISCONTINUED] ondansetron (ZOFRAN) IV, senna-docusate, traMADol   Assessment/Plan   1.  Atrial fibrillation: Known hx paroxysmal AF.  Seen in ED at [facility] the week PTA for AF and spontaneously converted to SR.  Presented 02/17/21 with AF with RVR. Cardioverted in ER.  She was in junctional rhythm after respiratory arrest and intubation with rate upper 40s-50s. Rhythm on 1/3 appeared to be wandering atrial pacemaker, and amiodarone stopped.  Now back in AF with RVR and amiodarone restarted.  - Remains in A Fib with intermittent RVR. Continue amio 60 mg per hour. - Continue Eliquis.  - Will need DC-CV. Will set up for tomorrow.  2. Acute systolic CHF: Echo in 5/68 with EF 50-55%, mild LVH, grade 2 diastolic dysfunction.  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  [place] 11/23/17 showed significantly elevated right and left heart filling pressures and severe primarily pulmonary venous hypertension. Cardiac output preserved. Echo (10/19) with EF 45-50%, septal-lateral dyssynchrony, RV looked ok.  PYP scan was not suggestive of transthyretin amyloidosis.  Echo in 1/21 with EF 55-60%, normal RV.  RHC/LHC in 4/21 with no significant CAD, normal filling pressures and cardiac output.  Echo this admission showed EF down to 35%.  HS-TnI 385 => 378, likely demand ischemia  with hypotension/respiratory arrest.  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 Was on NE. Now off and on midodrine  - Leave off NE as long as SBP > 120 (runs low diastolic pressure).  - Continue midodrine 10 mg tid.  - Would like eventual coronary CT to make sure we are not missing an anomalous vessel with unusual coronary tree on cath.  3. Acute respiratory failure:  Minimally responsive 12/29 and intubated for airway protection.  Extubated 12/30, stable today. Possible aspiration event with baseline OHS. Chest x-ray with increasing LLL airspace disease. Trach aspirate with normal flora, BC negative.  Seen by SLP, dysphagia 3 diet recommended.  - Completed tx w/ ceftriaxone -Resolved.  4. Acute encephalopathy: Head CT and MRI brain  without acute process. Bilateral intracranial ICA stenosis (but no significant extracranial disease). EEG with no seizure activity. - Resolved.   5. ESRD: Followed by Dr. Hollie Salk as outpatient.  Volume status optimized on CVVH (stopped 1/1).  - Tolerating iHD. Management per nephrology  - Continue midodrine to 10 mg tid.  6. Pulmonary HTN: Mild PH on 4/21 RHC.  7. Hyperlipidemia: on atorvastatin 80.  8. DM II: A1c 9.1 - Per primary team 9. ID: Elevated WBCs, fever on 12/29. WBCs now trending down. ?Aspiration PNA.  - Vanc/zosyn switched to ceftriaxone. Completed 7 day course     Will set up DC-CV.   Darrick Grinder, NP-C  03/02/2021 7:15 AM  Patient seen with NP, agree with the above note.   She is seen at HD today.  No complaints, BP stable on midodrine.  HR in 90s in atrial fibrillation on amiodarone gtt.   General: NAD Neck: No JVD, no thyromegaly or thyroid nodule.  Lungs: Clear to auscultation bilaterally with normal respiratory effort. CV: Nondisplaced PMI.  Heart irregular S1/S2, no S3/S4,  no murmur.  No peripheral edema.   Abdomen: Soft, nontender, no hepatosplenomegaly, no distention.  Skin: Intact without lesions or rashes.  Neurologic: Alert and oriented x 3.  Psych: Normal affect. Extremities: No clubbing or cyanosis.  HEENT: Normal.   If she remains in AF tomorrow, will plan for DCCV.  She has been consistently anticoagulated and had DCCV at admission. I discussed risks/benefits with her and she agrees to procedure.  Continue apixaban.   Loralie Champagne 03/02/2021 9:25 AM

## 2021-03-02 NOTE — Progress Notes (Signed)
Progress Note    Stephanie Woodward   EXN:[id number]  DOB: June 02, 1940  DOA: 02/17/2021     11 PCP: Larey Dresser, MD  Initial CC: SOB, palpitations   Hospital Course: [name] is a 81 yo female with PMH PAF, CHF, ESRD on HD, COPD, IDDM, gout, HLD, HTN, sleep apnea who initially presented to the hospital on 02/17/2021 with shortness of breath and palpitations.  She was found to be in A. fib with RVR.  She had further decreased level of responsiveness and ultimately required intubation for airway protection on 02/19/2021.  She then developed hypotension requiring vasopressor support.  She was able to be extubated on 02/20/2021 and was started on midodrine to help with Levophed weaning.  She was managed by cardiology as well. She underwent LHC on 02/24/21 which showed nonobstructive CAD although an abnormal coronary tree.   Interval History:  No events overnight.  Seen in HD this morning.  She again had no concerns, still denies any chest pain, palpitations, or shortness of breath.  Rate better but still in the low 100s  Assessment & Plan: * Paroxysmal atrial fibrillation with RVR (Stephanie Woodward)- (present on admission) - Continue amiodarone and Eliquis - oral amio stopped on 1/5 as patient developed afib RVR again; did not respond to test dose of 2.5 mg IV lopressor and BP was already borderline, therefore cardiology started back on amio IV drip evening of 1/5. -Rate better on IV amiodarone but remains dependent; plan is for DCCV with cardiology on 03/03/21 if still in afib   Acute on chronic combined systolic and diastolic CHF (congestive heart failure) ([place]) - new echo this admission: EF 25-30 %.  Grade 2 DD. Patient underwent LHC showing nonobstructive CAD but "unusual coronary tree" with small LAD and very large dominant RCA -Cardiology continues to follow -suspect still will need more med changes prior to d/c   Hypotension - Weaned off of Levophed while in the ICU however required midodrine to  help with weaning.  Blood pressure has been slowly improving, slightly hypertensive this morning - Midodrine weaned to 5 mg 3 times daily per cardiology but developed more hypoTN again, dosing increased back to 10 mg TID -Blood pressure has improved but borderline low, thus unable to wean midodrine currently  ESRD on hemodialysis ([place]) - Continue dialysis per nephrology  Acute respiratory failure with hypoxia ([facility])-resolved as of 02/26/2021 - Likely multifactorial in setting of CHF exacerbation initially - Has been weaned down to room air  Hyperlipidemia- (present on admission) - Continue Lipitor  Type 2 diabetes mellitus, controlled, with renal complications ([place])- (present on admission) - Ac 9.1% on 02/18/21 - CBGs rather labile on just sliding scale but with ESRD I'm worried about clearance and accumulation if using basal - will discuss her diet to make sure she's compliant and try and adjust regimen as necessary  -Glucose levels better after carb restricting her diet - Continue SSI and CBG monitoring  COPD (chronic obstructive pulmonary disease) ([place])- (present on admission) - no s/s exac    Old records reviewed in assessment of this patient  Antimicrobials:   DVT prophylaxis: Eliquis  Code Status:   Code Status: Full Code  Disposition Plan: SNF Status is: Inpatient  Objective: Blood pressure (!) 154/71, pulse 94, temperature 98.1 F (36.7 C), temperature source Oral, resp. rate 19, height 5\' 3"  (1.6 m), weight 85.5 kg, SpO2 97 %.  Examination:  Physical Exam Constitutional:      Appearance: Normal appearance.  HENT:  Head: Normocephalic and atraumatic.     Mouth/Throat:     Mouth: Mucous membranes are moist.  Eyes:     Extraocular Movements: Extraocular movements intact.  Cardiovascular:     Rate and Rhythm: Tachycardia present. Rhythm irregular.     Comments: Improved tachycardia Pulmonary:     Effort: Pulmonary effort is normal.     Breath sounds:  Normal breath sounds.  Abdominal:     General: Bowel sounds are normal. There is no distension.     Palpations: Abdomen is soft.     Tenderness: There is no abdominal tenderness.  Musculoskeletal:        General: Normal range of motion.     Cervical back: Normal range of motion and neck supple.  Skin:    General: Skin is warm and dry.  Neurological:     General: No focal deficit present.     Mental Status: She is alert.  Psychiatric:        Mood and Affect: Mood normal.        Behavior: Behavior normal.     Consultants:  Nephrology Cardiology  Procedures:    Data Reviewed: I have personally reviewed labs and imaging studies    LOS: 11 days  Time spent: Greater than 50% of the 35 minute visit was spent in counseling/coordination of care for the patient as laid out in the A&P.   Dwyane Dee, MD [facility] 03/02/2021, 2:19 PM

## 2021-03-02 NOTE — Progress Notes (Signed)
PT Cancellation Note  Patient Details Name: Stephanie Woodward MRN: [id number] DOB: [birth year]   Cancelled Treatment:    Reason Eval/Treat Not Completed: (P) Patient at procedure or test/unavailable Pt is off floor for HD. PT will follow back for treatment this afternoon as able.  [name] [name] PT, DPT Acute Rehabilitation Services Pager [phone] Office [phone]   [place] 03/02/2021, 8:39 AM

## 2021-03-02 NOTE — Progress Notes (Signed)
[facility] Progress Note   Subjective: Seen on HD. No C/Os. For DCCV tomorrow.    Objective Vitals:   03/02/21 0812 03/02/21 0830 03/02/21 0900 03/02/21 0930  BP: (!) 131/53 (!) 144/65 137/67 113/62  Pulse: 100 93 89   Resp: 17 15 16 16   Temp:      TempSrc:      SpO2: 94%     Weight:      Height:       Physical Exam  General: elderly female in bed in NAD   HEENT NCAT Neck: supple trachea midline Heart: Irregular; S1S2 no rub Lungs: Clear to auscultation; unlabored on room air  Abdomen: soft/nt/obese habitus Extremities: No edema lower extremities Psych normal mood and affect Neuro -alert and oriented x 3 provides hx and follows commands Dialysis Access: LUE AVF with bruit and thrill    Additional Objective Labs: Basic Metabolic Panel: Recent Labs  Lab 02/25/21 0608 02/26/21 0300 02/28/21 0255 03/01/21 0318 03/02/21 0321  NA 135   < > 135 134* 135  K 4.5   < > 3.5 3.3* 3.5  CL 103   < > 98 94* 94*  CO2 19*   < > 28 25 25   GLUCOSE 142*   < > 300* 155* 134*  BUN 35*   < > 18 25* 31*  CREATININE 7.33*   < > 4.47* 6.32* 7.84*  CALCIUM 9.5   < > 9.1 9.4 9.3  PHOS 4.1  --   --  4.2 4.8*   < > = values in this interval not displayed.   Liver Function Tests: Recent Labs  Lab 03/01/21 0318 03/02/21 0321  ALBUMIN 2.6* 2.5*   No results for input(s): LIPASE, AMYLASE in the last 168 hours. CBC: Recent Labs  Lab 02/26/21 0300 02/26/21 0300 02/27/21 0159 02/28/21 0255 03/01/21 0318 03/02/21 0321  WBC 10.1  --  10.8* 10.3 11.0* 10.9*  NEUTROABS  --    < > 7.3 7.3 7.9* 7.7  HGB 10.6*  --  10.8* 11.1* 10.9* 10.8*  HCT 32.6*  --  34.2* 34.1* 34.2* 32.8*  MCV 95.3  --  97.2 96.6 97.7 95.9  PLT 229  --  237 215 217 207   < > = values in this interval not displayed.   Blood Culture    Component Value Date/Time   SDES TRACHEAL ASPIRATE 02/19/2021 1052   SPECREQUEST NONE 02/19/2021 1052   CULT  02/19/2021 1052    MODERATE Normal respiratory  flora-no Staph aureus or Pseudomonas seen Performed at [facility] [address] [place], [place] [address]    REPTSTATUS 02/21/2021 FINAL 02/19/2021 1052    Cardiac Enzymes: No results for input(s): CKTOTAL, CKMB, CKMBINDEX, TROPONINI in the last 168 hours. CBG: Recent Labs  Lab 03/01/21 0749 03/01/21 1157 03/01/21 1627 03/01/21 2111 03/02/21 0722  GLUCAP 187* 259* 210* 163* 142*   Iron Studies: No results for input(s): IRON, TIBC, TRANSFERRIN, FERRITIN in the last 72 hours. @lablastinr3 @ Studies/Results: No results found. Medications:  sodium chloride Stopped (02/25/21 1953)   amiodarone 60 mg/hr (03/02/21 0430)    apixaban  5 mg Oral BID   atorvastatin  80 mg Oral QPM   Chlorhexidine Gluconate Cloth  6 each Topical Q0600   doxercalciferol  5 mcg Intravenous Q M,W,F-HD   feeding supplement (NEPRO CARB STEADY)  237 mL Oral TID WC   insulin aspart  0-9 Units Subcutaneous TID AC & HS   latanoprost  1 drop Both  Eyes QHS   lidocaine  1 patch Transdermal Q24H   midodrine  10 mg Oral TID WC   mometasone-formoterol  2 puff Inhalation BID   multivitamin  1 tablet Oral QHS   pantoprazole  20 mg Oral BID   QUEtiapine  25 mg Oral QHS   sodium chloride flush  3 mL Intravenous Q12H   OP HD:  [facility]  (will be going to MWF schedule at [facility] for SNF)   4h  350/500  87.2kg  2/2 bath  P2  Hep none   - hect 5 ug tiw   - venofer 50 q wk  - no esa     Assessment/Plan: ESRD - CRRT started 12/30 and stopped 1/1.  HD is now per a MWF schedule (per request of her new SNF). Next HD 03/04/2021.  AMS - delirium. Neuro consulted. resolved Acute resp insufficiency - extubated 12/30. +fever/ ^wbc/ pulm infiltrates. S/p broad spectrum IV abx w/ vanc/ zosyn. Extra volume removed w/ CRRT and was under EDW after that. Optimize volume with HD Hypotension - Increased midodrine back to 10 mg TID.  May ultimately need to plan to decrease to midodrine prior to HD Paroxysmal Afib -  cardioverted in ED 12/27.  S/p IV amio as above and on amio gtt during last HD.  Going for DC-CV tomorrow. 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 Follow HGB.  MBD ckd - Cont hectorol here. No binders on home med list and phos at goal  [place]. Thaniel Coluccio NP-C 03/02/2021, 9:54 AM  [employer] [phone]

## 2021-03-02 NOTE — Progress Notes (Addendum)
Patient ID: Stephanie Woodward, female   DOB: 12/12/1940, 81 y.o.   MRN: [id number]     Advanced Heart Failure Rounding Note  PCP-Cardiologist: Ezzard Standing, MD   Subjective:    12/29: Found unresponsive, no arrhythmias noted. Intubated. Started on NE.  MRI brain without acute process. 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  Had fever to 101F. Started on broad spectrum abx and pan cultures sent.  12/30: CVVH started. Extubated.  Echo reviewed: EF 30-35% range, worse function in septum; mild RV dysfunction, IVC dilated.  01/01: CRRT stopped 01/03: LHC showed no obstructive CAD. 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  1/5: Midodrine cut back to 5 mg tid. 1/6: Back in A fib RVR. IV amio restarted. Midodrine increased to 10 mg tid.   Remains in A fib on amio 60 mg per hour.     Feels ok. Denies SOB.   Objective:   Weight Range: 87.2 kg Body mass index is 34.05 kg/m.   Vital Signs:   Temp:  [98.2 F (36.8 C)-99 F (37.2 C)] 98.2 F (36.8 C) (01/09 0500) Pulse Rate:  [85-106] 85 (01/09 0500) Resp:  [11-18] 18 (01/09 0500) BP: (120-140)/(50-82) 120/50 (01/09 0500) SpO2:  [98 %-100 %] 98 % (01/08 1420) Weight:  [87.2 kg] 87.2 kg (01/09 0500) Last BM Date: 03/01/21  Weight change: Filed Weights   02/27/21 1230 02/28/21 0429 03/02/21 0500  Weight: 86.5 kg 85.7 kg 87.2 kg    Intake/Output:   Intake/Output Summary (Last 24 hours) at 03/02/2021 0715 Last data filed at 03/02/2021 0100 Gross per 24 hour  Intake 1242.9 ml  Output --  Net 1242.9 ml       Physical Exam   General:  In bed. No resp difficulty HEENT: normal Neck: supple. no JVD. Carotids 2+ bilat; no bruits. No lymphadenopathy or thryomegaly appreciated. Cor: PMI nondisplaced. Irregular rate & rhythm. No  rubs, gallops or murmurs. Lungs: clear Abdomen: soft, nontender, nondistended. No hepatosplenomegaly. No bruits or masses. Good bowel sounds. Extremities: no cyanosis, clubbing, rash, edema. LUE AVF Neuro: alert & orientedx3, cranial nerves grossly intact. moves all 4 extremities w/o difficulty. Affect pleasant  Telemetry  A fib 80-100s personally reviewed.    Labs    CBC Recent Labs    03/01/21 0318 03/02/21 0321  WBC 11.0* 10.9*  NEUTROABS 7.9* 7.7  HGB 10.9* 10.8*  HCT 34.2* 32.8*  MCV 97.7 95.9  PLT 217 606   Basic Metabolic Panel Recent Labs    03/01/21 0318 03/02/21 0321  NA 134* 135  K 3.3* 3.5  CL 94* 94*  CO2 25 25  GLUCOSE 155* 134*  BUN 25* 31*  CREATININE 6.32* 7.84*  CALCIUM 9.4 9.3  MG 2.2 2.1  PHOS 4.2 4.8*   Liver Function Tests Recent Labs    03/01/21 0318 03/02/21 0321  ALBUMIN 2.6* 2.5*    No results for input(s): LIPASE, AMYLASE in the last 72 hours. Cardiac Enzymes No results for input(s): CKTOTAL, CKMB, CKMBINDEX, TROPONINI in the last 72 hours.  BNP: BNP (last 3 results) Recent Labs    06/08/20 1011  BNP 402.0*    ProBNP (last 3 results) No results for input(s): PROBNP in the last 8760 hours.   D-Dimer No results for input(s): DDIMER in the last 72 hours. Hemoglobin A1C No results for input(s): HGBA1C in the  last 72 hours.  Fasting Lipid Panel No results for input(s): CHOL, HDL, LDLCALC, TRIG, CHOLHDL, LDLDIRECT in the last 72 hours.  Thyroid Function Tests No results for input(s): TSH, T4TOTAL, T3FREE, THYROIDAB in the last 72 hours.  Invalid input(s): FREET3   Other results:   Imaging    No results found.   Medications:     Scheduled Medications:  apixaban  5 mg Oral BID   atorvastatin  80 mg Oral QPM   Chlorhexidine Gluconate Cloth  6 each Topical Q0600   doxercalciferol  5 mcg Intravenous Q M,W,F-HD   feeding supplement (NEPRO CARB STEADY)  237 mL Oral TID WC   insulin aspart  0-9 Units  Subcutaneous TID AC & HS   latanoprost  1 drop Both Eyes QHS   lidocaine  1 patch Transdermal Q24H   midodrine  10 mg Oral TID WC   mometasone-formoterol  2 puff Inhalation BID   multivitamin  1 tablet Oral QHS   pantoprazole  20 mg Oral BID   QUEtiapine  25 mg Oral QHS   sodium chloride flush  3 mL Intravenous Q12H    Infusions:  sodium chloride Stopped (02/25/21 1953)   amiodarone 60 mg/hr (03/02/21 0430)    PRN Medications: sodium chloride, acetaminophen, guaiFENesin-dextromethorphan, hydrALAZINE, ipratropium-albuterol, ondansetron (ZOFRAN) IV, ondansetron **OR** [DISCONTINUED] ondansetron (ZOFRAN) IV, senna-docusate, traMADol   Assessment/Plan   1.  Atrial fibrillation: Known hx paroxysmal AF.  Seen in ED at [facility] the week PTA for AF and spontaneously converted to SR.  Presented 02/17/21 with AF with RVR. Cardioverted in ER.  She was in junctional rhythm after respiratory arrest and intubation with rate upper 40s-50s. Rhythm on 1/3 appeared to be wandering atrial pacemaker, and amiodarone stopped.  Now back in AF with RVR and amiodarone restarted.  - Remains in A Fib with intermittent RVR. Continue amio 60 mg per hour. - Continue Eliquis.  - Will need DC-CV. Will set up for tomorrow.  2. Acute systolic CHF: Echo in 1/06 with EF 50-55%, mild LVH, grade 2 diastolic dysfunction.  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  [facility] 11/23/17 showed significantly elevated right and left heart filling pressures and severe primarily pulmonary venous hypertension. Cardiac output preserved. Echo (10/19) with EF 45-50%, septal-lateral dyssynchrony, RV looked ok.  PYP scan was not suggestive of transthyretin amyloidosis.  Echo in 1/21 with EF 55-60%, normal RV.  RHC/LHC in 4/21 with no significant CAD, normal filling pressures and cardiac output.  Echo this admission showed EF down to 35%.  HS-TnI 385 => 378, likely demand ischemia  with hypotension/respiratory arrest.  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 Was on NE. Now off and on midodrine  - Leave off NE as long as SBP > 120 (runs low diastolic pressure).  - Continue midodrine 10 mg tid.  - Would like eventual coronary CT to make sure we are not missing an anomalous vessel with unusual coronary tree on cath.  3. Acute respiratory failure:  Minimally responsive 12/29 and intubated for airway protection.  Extubated 12/30, stable today. Possible aspiration event with baseline OHS. Chest x-ray with increasing LLL airspace disease. Trach aspirate with normal flora, BC negative.  Seen by SLP, dysphagia 3 diet recommended.  - Completed tx w/ ceftriaxone -Resolved.  4. Acute encephalopathy: Head CT and MRI brain  without acute process. Bilateral intracranial ICA stenosis (but no significant extracranial disease). EEG with no seizure activity. - Resolved.   5. ESRD: Followed by Dr. Hollie Salk as outpatient.  Volume status optimized on CVVH (stopped 1/1).  - Tolerating iHD. Management per nephrology  - Continue midodrine to 10 mg tid.  6. Pulmonary HTN: Mild PH on 4/21 RHC.  7. Hyperlipidemia: on atorvastatin 80.  8. DM II: A1c 9.1 - Per primary team 9. ID: Elevated WBCs, fever on 12/29. WBCs now trending down. ?Aspiration PNA.  - Vanc/zosyn switched to ceftriaxone. Completed 7 day course     Will set up DC-CV.   Darrick Grinder, NP-C  03/02/2021 7:15 AM  Patient seen with NP, agree with the above note.   She is seen at HD today.  No complaints, BP stable on midodrine.  HR in 90s in atrial fibrillation on amiodarone gtt.   General: NAD Neck: No JVD, no thyromegaly or thyroid nodule.  Lungs: Clear to auscultation bilaterally with normal respiratory effort. CV: Nondisplaced PMI.  Heart irregular S1/S2, no S3/S4,  no murmur.  No peripheral edema.   Abdomen: Soft, nontender, no hepatosplenomegaly, no distention.  Skin: Intact without lesions or rashes.  Neurologic: Alert and oriented x 3.  Psych: Normal affect. Extremities: No clubbing or cyanosis.  HEENT: Normal.   If she remains in AF tomorrow, will plan for DCCV.  She has been consistently anticoagulated and had DCCV at admission. I discussed risks/benefits with her and she agrees to procedure.  Continue apixaban.   Loralie Champagne 03/02/2021 9:25 AM

## 2021-03-02 NOTE — TOC Progression Note (Signed)
Transition of Care [facility]) - Progression Note    Patient Details  Name: Stephanie Woodward MRN: [id number] Date of Birth: 10/02/1940  [facility] of [facility]) CM/SW Contact  Phoenyx Paulsen, LCSW Phone Number: 03/02/2021, 4:55 PM  Clinical Narrative:    [name] is still in Afib pending possible cardioversion pending patient's progress. Will reach out to [facility] once [name] is medically ready for discharge.  Renal navigator, Olivia Mackie will need to be updated about the DC plan to confirm outpatient HD with [facility].   Awaiting patient's medical readiness for discharge. Insurance authorization has been approved for SNF [id number]) and transportation [phone]) at time of discharge.   CSW will continue to follow throughout discharge.   Expected Discharge Plan: Skilled Nursing Facility Barriers to Discharge: Continued Medical Work up  Expected Discharge Plan and Services Expected Discharge Plan: [place] In-house Referral: Clinical Social Work Discharge Planning Services: CM Consult Post Acute Care Choice: [place] Living arrangements for the past 2 months: Apartment                                       Social Determinants of Health (SDOH) Interventions Food Insecurity Interventions: Intervention Not Indicated Financial Strain Interventions: Intervention Not Indicated, Other (Comment) (Pt. denied any concerns about finances) Housing Interventions: Intervention Not Indicated (Pt. lives alone in an apartment with support from her daughters as needed) Transportation Interventions: Intervention Not Indicated, Other (Comment) (Pt. reports she is driving and doesn't have trouble getting to appointments)  Readmission Risk Interventions No flowsheet data found.

## 2021-03-03 ENCOUNTER — Encounter (HOSPITAL_COMMUNITY)
Admission: EM | Disposition: A | Discharge status: Skilled Nursing Facility | Payer: Self-pay | Source: Ambulatory Visit | Attending: Internal Medicine

## 2021-03-03 ENCOUNTER — Encounter (HOSPITAL_COMMUNITY): Payer: Self-pay | Admitting: Internal Medicine

## 2021-03-03 ENCOUNTER — Inpatient Hospital Stay (HOSPITAL_COMMUNITY): Payer: HMO | Admitting: Anesthesiology

## 2021-03-03 DIAGNOSIS — I480 Paroxysmal atrial fibrillation: Secondary | ICD-10-CM | POA: Diagnosis not present

## 2021-03-03 DIAGNOSIS — I4891 Unspecified atrial fibrillation: Secondary | ICD-10-CM | POA: Diagnosis not present

## 2021-03-03 DIAGNOSIS — I5043 Acute on chronic combined systolic (congestive) and diastolic (congestive) heart failure: Secondary | ICD-10-CM | POA: Diagnosis not present

## 2021-03-03 HISTORY — PX: CARDIOVERSION: SHX1299

## 2021-03-03 LAB — RENAL FUNCTION PANEL
Albumin: 2.6 g/dL — ABNORMAL LOW (ref 3.5–5.0)
Anion gap: 15 (ref 5–15)
BUN: 22 mg/dL (ref 8–23)
CO2: 24 mmol/L (ref 22–32)
Calcium: 8.9 mg/dL (ref 8.9–10.3)
Chloride: 98 mmol/L (ref 98–111)
Creatinine, Ser: 5.8 mg/dL — ABNORMAL HIGH (ref 0.44–1.00)
GFR, Estimated: 7 mL/min — ABNORMAL LOW (ref >60)
Glucose, Bld: 211 mg/dL — ABNORMAL HIGH (ref 70–99)
Phosphorus: 2.5 mg/dL (ref 2.5–4.6)
Potassium: 3.5 mmol/L (ref 3.5–5.1)
Sodium: 137 mmol/L (ref 135–145)

## 2021-03-03 LAB — CBC WITH DIFFERENTIAL/PLATELET
Abs Immature Granulocytes: 0.07 10*3/uL (ref 0.00–0.07)
Basophils Absolute: 0.1 10*3/uL (ref 0.0–0.1)
Basophils Relative: 1 %
Eosinophils Absolute: 0.1 10*3/uL (ref 0.0–0.5)
Eosinophils Relative: 1 %
HCT: 32 % — ABNORMAL LOW (ref 36.0–46.0)
Hemoglobin: 10.6 g/dL — ABNORMAL LOW (ref 12.0–15.0)
Immature Granulocytes: 1 %
Lymphocytes Relative: 16 %
Lymphs Abs: 1.8 10*3/uL (ref 0.7–4.0)
MCH: 31.9 pg (ref 26.0–34.0)
MCHC: 33.1 g/dL (ref 30.0–36.0)
MCV: 96.4 fL (ref 80.0–100.0)
Monocytes Absolute: 1.1 10*3/uL — ABNORMAL HIGH (ref 0.1–1.0)
Monocytes Relative: 10 %
Neutro Abs: 7.9 10*3/uL — ABNORMAL HIGH (ref 1.7–7.7)
Neutrophils Relative %: 71 %
Platelets: 195 10*3/uL (ref 150–400)
RBC: 3.32 MIL/uL — ABNORMAL LOW (ref 3.87–5.11)
RDW: 15.8 % — ABNORMAL HIGH (ref 11.5–15.5)
WBC: 11.1 10*3/uL — ABNORMAL HIGH (ref 4.0–10.5)
nRBC: 0 % (ref 0.0–0.2)

## 2021-03-03 LAB — GLUCOSE, CAPILLARY
Glucose-Capillary: 183 mg/dL — ABNORMAL HIGH (ref 70–99)
Glucose-Capillary: 198 mg/dL — ABNORMAL HIGH (ref 70–99)
Glucose-Capillary: 182 mg/dL — ABNORMAL HIGH (ref 70–99)
Glucose-Capillary: 194 mg/dL — ABNORMAL HIGH (ref 70–99)

## 2021-03-03 LAB — MAGNESIUM: Magnesium: 2.1 mg/dL (ref 1.7–2.4)

## 2021-03-03 SURGERY — CARDIOVERSION
Anesthesia: General

## 2021-03-03 MED ORDER — INSULIN DETEMIR 100 UNIT/ML ~~LOC~~ SOLN
5.0000 [IU] | Freq: Every day | SUBCUTANEOUS | Status: DC
  Administered 2021-03-04 – 2021-03-05 (×2): 5 [IU] via SUBCUTANEOUS
  Filled 2021-03-03 (×2): qty 0.05

## 2021-03-03 MED ORDER — LIDOCAINE HCL (CARDIAC) PF 100 MG/5ML IV SOSY
PREFILLED_SYRINGE | INTRAVENOUS | Status: DC | PRN
  Administered 2021-03-03: 40 mg via INTRAVENOUS

## 2021-03-03 MED ORDER — PROPOFOL 10 MG/ML IV BOLUS
INTRAVENOUS | Status: DC | PRN
  Administered 2021-03-03: 30 mg via INTRAVENOUS

## 2021-03-03 MED ORDER — INSULIN DETEMIR 100 UNIT/ML ~~LOC~~ SOLN: 5.0000 [IU] | Freq: Every day | SUBCUTANEOUS | Status: DC

## 2021-03-03 NOTE — Progress Notes (Signed)
[facility] Progress Note   Subjective: Up in chair post cardioversion. SR on monitor. No C/Os.   Objective Vitals:   03/03/21 1246 03/03/21 1251 03/03/21 1259 03/03/21 1309  BP: (!) 107/32 (!) 112/37 (!) 124/34 (!) 127/47  Pulse: 60 62 (!) 59 (!) 54  Resp: 17 (!) 25 18 18   Temp:      TempSrc:      SpO2: 96% 92% 94% 97%  Weight:      Height:       General: elderly female in bed in NAD   HEENT NCAT Neck: supple trachea midline Heart: S1S2 no M/R/G SR on monitor.  Lungs: Clear to auscultation; unlabored on room air  Abdomen: soft/nt/obese habitus Extremities: No edema lower extremities Psych normal mood and affect Neuro -alert and oriented x 3 provides hx and follows commands Dialysis Access: LUE AVF with bruit and thrill        Additional Objective Labs: Basic Metabolic Panel: Recent Labs  Lab 03/01/21 0318 03/02/21 0321 03/03/21 0048  NA 134* 135 137  K 3.3* 3.5 3.5  CL 94* 94* 98  CO2 25 25 24   GLUCOSE 155* 134* 211*  BUN 25* 31* 22  CREATININE 6.32* 7.84* 5.80*  CALCIUM 9.4 9.3 8.9  PHOS 4.2 4.8* 2.5   Liver Function Tests: Recent Labs  Lab 03/01/21 0318 03/02/21 0321 03/03/21 0048  ALBUMIN 2.6* 2.5* 2.6*   No results for input(s): LIPASE, AMYLASE in the last 168 hours. CBC: Recent Labs  Lab 02/27/21 0159 02/28/21 0255 03/01/21 0318 03/02/21 0321 03/03/21 0048  WBC 10.8* 10.3 11.0* 10.9* 11.1*  NEUTROABS 7.3 7.3 7.9* 7.7 7.9*  HGB 10.8* 11.1* 10.9* 10.8* 10.6*  HCT 34.2* 34.1* 34.2* 32.8* 32.0*  MCV 97.2 96.6 97.7 95.9 96.4  PLT 237 215 217 207 195   Blood Culture    Component Value Date/Time   SDES TRACHEAL ASPIRATE 02/19/2021 1052   SPECREQUEST NONE 02/19/2021 1052   CULT  02/19/2021 1052    MODERATE Normal respiratory flora-no Staph aureus or Pseudomonas seen Performed at [facility] [address], [place], [place] [address]    REPTSTATUS 02/21/2021 FINAL 02/19/2021 1052    Cardiac Enzymes: No results  for input(s): CKTOTAL, CKMB, CKMBINDEX, TROPONINI in the last 168 hours. CBG: Recent Labs  Lab 03/02/21 1227 03/02/21 1547 03/02/21 2138 03/03/21 0736 03/03/21 1135  GLUCAP 117* 251* 275* 183* 198*   Iron Studies: No results for input(s): IRON, TIBC, TRANSFERRIN, FERRITIN in the last 72 hours. @lablastinr3 @ Studies/Results: No results found. Medications:  sodium chloride Stopped (02/25/21 1953)   amiodarone 30 mg/hr (03/03/21 1243)    apixaban  5 mg Oral BID   atorvastatin  80 mg Oral QPM   Chlorhexidine Gluconate Cloth  6 each Topical Q0600   doxercalciferol  5 mcg Intravenous Q M,W,F-HD   feeding supplement (NEPRO CARB STEADY)  237 mL Oral TID WC   insulin aspart  0-9 Units Subcutaneous TID AC & HS   [START ON 03/04/2021] insulin detemir  5 Units Subcutaneous Daily   latanoprost  1 drop Both Eyes QHS   lidocaine  1 patch Transdermal Q24H   midodrine  10 mg Oral TID WC   mometasone-formoterol  2 puff Inhalation BID   multivitamin  1 tablet Oral QHS   pantoprazole  20 mg Oral BID   QUEtiapine  25 mg Oral QHS   sodium chloride flush  3 mL Intravenous Q12H     OP HD:  [facility]  (  will be going to MWF schedule at [facility] for SNF)   4h  350/500  87.2kg  2/2 bath  P2  Hep none   - hect 5 ug tiw   - venofer 50 q wk  - no esa      Assessment/Plan: ESRD - CRRT started 12/30 and stopped 1/1.  HD is now per a MWF schedule (per request of her new SNF). Next HD 03/04/2021.  AMS - delirium. Neuro consulted. resolved Acute resp insufficiency - extubated 12/30. +fever/ ^wbc/ pulm infiltrates. S/p broad spectrum IV abx w/ vanc/ zosyn. Extra volume removed w/ CRRT and was under EDW after that. Optimize volume with HD Hypotension - Increased midodrine back to 10 mg TID.  May ultimately need to plan to decrease to midodrine prior to HD Paroxysmal Afib - cardioverted in ED 12/27.  S/p IV amio as above and on amio gtt during last HD.  Went to DC cardioversion today. Per primary team and  cardiology. 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 Follow HGB.  MBD ckd - Cont hectorol here. No binders on home med list and phos at goal    [place]. Fletcher Ostermiller NP-C 03/03/2021, 1:48 PM  [employer] [phone]

## 2021-03-03 NOTE — Progress Notes (Signed)
OT Cancellation Note  Patient Details Name: Stephanie Woodward MRN: [id number] DOB: 1940/11/21   Cancelled Treatment:    Reason Eval/Treat Not Completed: Patient at procedure or test/ unavailable  Jeri Modena 03/03/2021, 12:46 PM  Fleeta Emmer, OTR/L  Acute Rehabilitation Services Pager: [phone] Office: [phone] .

## 2021-03-03 NOTE — Care Management Important Message (Signed)
Important Message  Patient Details  Name: Stephanie Woodward MRN: [id number] Date of Birth: 11-Jan-1941   Medicare Important Message Given:  Yes     Shelda Altes 03/03/2021, 8:27 AM

## 2021-03-03 NOTE — Progress Notes (Addendum)
Inpatient Diabetes Program Recommendations  AACE/ADA: New Consensus Statement on Inpatient Glycemic Control (2015)  Target Ranges:  Prepandial:   less than 140 mg/dL      Peak postprandial:   less than 180 mg/dL (1-2 hours)      Critically ill patients:  140 - 180 mg/dL   Lab Results  Component Value Date   GLUCAP 183 (H) 03/03/2021   HGBA1C 9.1 (H) 02/18/2021    Review of Glycemic Control  Latest Reference Range & Units 03/02/21 15:47 03/02/21 21:38 03/03/21 07:36  Glucose-Capillary 70 - 99 mg/dL 251 (H) 275 (H) 183 (H)  (H): Data is abnormally high Diabetes history: Type 2 DM Outpatient Diabetes medications: Tresiba 18 units QD, Novolog 12/02/10 TID Current orders for Inpatient glycemic control: Novolog 0-9 units TID & HS   Inpatient Diabetes Program Recommendations:     Consider adding Levemir 8 units QD and Novolog 3 units TID (assuming patient consuming >50% of meal).   Thanks, Bronson Curb, MSN, RNC-OB Diabetes Coordinator [phone] (8a-5p)

## 2021-03-03 NOTE — Procedures (Signed)
Electrical Cardioversion Procedure Note Stephanie Woodward [id number] 1940/05/19  Procedure: Electrical Cardioversion Indications:  Atrial Fibrillation  Procedure Details Consent: Risks of procedure as well as the alternatives and risks of each were explained to the (patient/caregiver).  Consent for procedure obtained. Time Out: Verified patient identification, verified procedure, site/side was marked, verified correct patient position, special equipment/implants available, medications/allergies/relevent history reviewed, required imaging and test results available.  Performed  Patient placed on cardiac monitor, pulse oximetry, supplemental oxygen as necessary.  Sedation given:  Propofol per anesthesiology Pacer pads placed anterior and posterior chest.  Cardioverted 1 time(s).  Cardioverted at [facility].  Evaluation Findings: Post procedure EKG shows: NSR Complications: None Patient did tolerate procedure well.   Loralie Champagne 03/03/2021, 12:43 PM

## 2021-03-03 NOTE — Anesthesia Postprocedure Evaluation (Signed)
Anesthesia Post Note  Patient: Stephanie Woodward  Procedure(s) Performed: CARDIOVERSION     Patient location during evaluation: PACU Anesthesia Type: General Level of consciousness: awake and alert Pain management: pain level controlled Vital Signs Assessment: post-procedure vital signs reviewed and stable Respiratory status: spontaneous breathing, nonlabored ventilation, respiratory function stable and patient connected to nasal cannula oxygen Cardiovascular status: blood pressure returned to baseline and stable Postop Assessment: no apparent nausea or vomiting Anesthetic complications: no   No notable events documented.  Last Vitals:  Vitals:   03/03/21 1259 03/03/21 1309  BP: (!) 124/34 (!) 127/47  Pulse: (!) 59 (!) 54  Resp: 18 18  Temp:    SpO2: 94% 97%    Last Pain:  Vitals:   03/03/21 1309  TempSrc:   PainSc: 0-No pain                 Effie Berkshire

## 2021-03-03 NOTE — Transfer of Care (Signed)
Immediate Anesthesia Transfer of Care Note  Patient: Stephanie Woodward  Procedure(s) Performed: CARDIOVERSION  Patient Location: Endoscopy Unit  Anesthesia Type:MAC  Level of Consciousness: awake  Airway & Oxygen Therapy: Patient Spontanous Breathing  Post-op Assessment: Report given to RN and Post -op Vital signs reviewed and stable  Post vital signs: Reviewed and stable  Last Vitals:  Vitals Value Taken Time  BP    Temp    Pulse    Resp    SpO2      Last Pain:  Vitals:   03/03/21 1213  TempSrc: Temporal  PainSc: 0-No pain      Patients Stated Pain Goal: 0 (30/16/01 0932)  Complications: No notable events documented.

## 2021-03-03 NOTE — Progress Notes (Signed)
SLP Cancellation Note  Patient Details Name: Stephanie Woodward MRN: [id number] DOB: 1940-08-06   Cancelled treatment:       Reason Eval/Treat Not Completed: Medical issues which prohibited therapy (currently NPO this am awaiting procedure). Will f/u as able.    Osie Bond., M.A. [place] Acute Rehabilitation Services Pager [phone] Office [phone]  03/03/2021, 10:39 AM

## 2021-03-03 NOTE — Anesthesia Preprocedure Evaluation (Addendum)
Anesthesia Evaluation  Patient identified by MRN, date of birth, ID band Patient awake    Reviewed: Allergy & Precautions, NPO status , Patient's Chart, lab work & pertinent test results  Airway Mallampati: I  TM Distance: >3 FB Neck ROM: Full    Dental  (+) Edentulous Lower, Edentulous Upper   Pulmonary asthma , sleep apnea , COPD,  COPD inhaler, former smoker,     + decreased breath sounds      Cardiovascular hypertension, Pt. on medications and Pt. on home beta blockers +CHF   Rhythm:Regular Rate:Normal  Echo: 1. Left ventricular ejection fraction, by estimation, is 25 to 30%. The  left ventricle has severely decreased function. The left ventricle  demonstrates regional wall motion abnormalities (see scoring  diagram/findings for description). There is mild left  ventricular hypertrophy. Left ventricular diastolic parameters are  consistent with Grade II diastolic dysfunction (pseudonormalization).  2. Right ventricular systolic function is mildly reduced. The right  ventricular size is normal. There is normal pulmonary artery systolic  pressure.  3. Left atrial size was mildly dilated.  4. Right atrial size was mildly dilated.  5. 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 Trivial mitral valve  regurgitation. No evidence of mitral stenosis.  6. The aortic valve was not well visualized. Aortic valve regurgitation  is not visualized. No aortic stenosis is present.  7. The inferior vena cava is dilated in size with >50% respiratory  variability, suggesting right atrial pressure of 8 mmHg.    Neuro/Psych negative neurological ROS  negative psych ROS   GI/Hepatic Neg liver ROS, GERD  Medicated,  Endo/Other  diabetes, Type 2, Insulin Dependent  Renal/GU       Musculoskeletal  (+) Arthritis ,   Abdominal Normal abdominal exam  (+)   Peds  Hematology negative hematology ROS (+)   Anesthesia Other Findings   Reproductive/Obstetrics                            Anesthesia Physical Anesthesia Plan  ASA: 4  Anesthesia Plan: General   Post-op Pain Management:    Induction: Intravenous  PONV Risk Score and Plan: 0  Airway Management Planned: Natural Airway and Mask  Additional Equipment: None  Intra-op Plan:   Post-operative Plan:   Informed Consent: I have reviewed the patients History and Physical, chart, labs and discussed the procedure including the risks, benefits and alternatives for the proposed anesthesia with the patient or authorized representative who has indicated his/her understanding and acceptance.       Plan Discussed with: CRNA  Anesthesia Plan Comments:         Anesthesia Quick Evaluation

## 2021-03-03 NOTE — Progress Notes (Signed)
PROGRESS NOTE    Stephanie Woodward  WUX:[id number] DOB: [birth year] DOA: 02/17/2021 PCP: Larey Dresser, MD    Brief Narrative:  Ms. Mcclusky is a 81 yo female with PMH PAF, CHF, ESRD on HD, COPD, IDDM, gout, HLD, HTN, sleep apnea who initially presented to the hospital on 02/17/2021 with shortness of breath and palpitations.  She was found to be in A. fib with RVR.  She had further decreased level of responsiveness and ultimately required intubation for airway protection on 02/19/2021.  She then developed hypotension requiring vasopressor support.  She was able to be extubated on 02/20/2021 and was started on midodrine to help with Levophed weaning.  She was managed by cardiology as well. She underwent LHC on 02/24/21 which showed nonobstructive CAD although an abnormal coronary tree.   1/9 no overnight issues. Sitting in chair . Denies dizziness , sob , or cp  Consultants:  Cardiology, PCCM, nephrology  Procedures:   Antimicrobials:      Subjective: Has no complaints. No sob, cp, dizziness.  Objective: Vitals:   03/02/21 2012 03/02/21 2031 03/03/21 0405 03/03/21 0851  BP:  127/69 136/70   Pulse:  99 99   Resp:  16 16   Temp:  99 F (37.2 C) 98 F (36.7 C)   TempSrc:  Oral Oral   SpO2: 96% 95% 94% 98%  Weight:   85 kg   Height:        Intake/Output Summary (Last 24 hours) at 03/03/2021 1013 Last data filed at 03/03/2021 0436 Gross per 24 hour  Intake 1087.39 ml  Output 1500 ml  Net -412.61 ml   Filed Weights   03/02/21 0758 03/02/21 1210 03/03/21 0405  Weight: 87 kg 85.5 kg 85 kg    Examination:  General exam: Appears calm and comfortable  Respiratory system: Clear to auscultation. Respiratory effort normal. Cardiovascular system: S1 & S2 heard,irreg-reg no gallop Gastrointestinal system: Abdomen is nondistended, soft and nontender. No organomegaly or masses felt. Normal bowel sounds heard. Central nervous system: Alert and oriented. Grossly intact Extremities:  trace edema Psychiatry: Mood & affect appropriate.     Data Reviewed: I have personally reviewed following labs and imaging studies  CBC: Recent Labs  Lab 02/27/21 0159 02/28/21 0255 03/01/21 0318 03/02/21 0321 03/03/21 0048  WBC 10.8* 10.3 11.0* 10.9* 11.1*  NEUTROABS 7.3 7.3 7.9* 7.7 7.9*  HGB 10.8* 11.1* 10.9* 10.8* 10.6*  HCT 34.2* 34.1* 34.2* 32.8* 32.0*  MCV 97.2 96.6 97.7 95.9 96.4  PLT 237 215 217 207 253   Basic Metabolic Panel: Recent Labs  Lab 02/25/21 0608 02/26/21 0300 02/27/21 0159 02/28/21 0255 03/01/21 0318 03/02/21 0321 03/03/21 0048  NA 135   < > 136 135 134* 135 137  K 4.5   < > 3.4* 3.5 3.3* 3.5 3.5  CL 103   < > 100 98 94* 94* 98  CO2 19*   < > 25 28 25 25 24   GLUCOSE 142*   < > 206* 300* 155* 134* 211*  BUN 35*   < > 25* 18 25* 31* 22  CREATININE 7.33*   < > 6.48* 4.47* 6.32* 7.84* 5.80*  CALCIUM 9.5   < > 9.3 9.1 9.4 9.3 8.9  MG  --   --  2.4 2.2 2.2 2.1 2.1  PHOS 4.1  --   --   --  4.2 4.8* 2.5   < > = values in this interval not displayed.   GFR: Estimated Creatinine Clearance: 8.3 mL/min (A) (  by C-G formula based on SCr of 5.8 mg/dL (H)). Liver Function Tests: Recent Labs  Lab 03/01/21 0318 03/02/21 0321 03/03/21 0048  ALBUMIN 2.6* 2.5* 2.6*   No results for input(s): LIPASE, AMYLASE in the last 168 hours. No results for input(s): AMMONIA in the last 168 hours. Coagulation Profile: Recent Labs  Lab 03/02/21 2232  INR 1.7*   Cardiac Enzymes: No results for input(s): CKTOTAL, CKMB, CKMBINDEX, TROPONINI in the last 168 hours. BNP (last 3 results) No results for input(s): PROBNP in the last 8760 hours. HbA1C: No results for input(s): HGBA1C in the last 72 hours. CBG: Recent Labs  Lab 03/02/21 0722 03/02/21 1227 03/02/21 1547 03/02/21 2138 03/03/21 0736  GLUCAP 142* 117* 251* 275* 183*   Lipid Profile: No results for input(s): CHOL, HDL, LDLCALC, TRIG, CHOLHDL, LDLDIRECT in the last 72 hours. Thyroid Function  Tests: No results for input(s): TSH, T4TOTAL, FREET4, T3FREE, THYROIDAB in the last 72 hours. Anemia Panel: No results for input(s): VITAMINB12, FOLATE, FERRITIN, TIBC, IRON, RETICCTPCT in the last 72 hours. Sepsis Labs: No results for input(s): PROCALCITON, LATICACIDVEN in the last 168 hours.  No results found for this or any previous visit (from the past 240 hour(s)).       Radiology Studies: No results found.      Scheduled Meds:  apixaban  5 mg Oral BID   atorvastatin  80 mg Oral QPM   Chlorhexidine Gluconate Cloth  6 each Topical Q0600   doxercalciferol  5 mcg Intravenous Q M,W,F-HD   feeding supplement (NEPRO CARB STEADY)  237 mL Oral TID WC   insulin aspart  0-9 Units Subcutaneous TID AC & HS   insulin detemir  5 Units Subcutaneous Daily   latanoprost  1 drop Both Eyes QHS   lidocaine  1 patch Transdermal Q24H   midodrine  10 mg Oral TID WC   mometasone-formoterol  2 puff Inhalation BID   multivitamin  1 tablet Oral QHS   pantoprazole  20 mg Oral BID   QUEtiapine  25 mg Oral QHS   sodium chloride flush  3 mL Intravenous Q12H   Continuous Infusions:  sodium chloride Stopped (02/25/21 1953)   sodium chloride 20 mL/hr at 03/03/21 0436   amiodarone 60 mg/hr (03/03/21 0937)    Assessment & Plan:   Principal Problem:   Paroxysmal atrial fibrillation with RVR (HCC) Active Problems:   COPD (chronic obstructive pulmonary disease) (HCC)   Type 2 diabetes mellitus, controlled, with renal complications (HCC)   Hyperlipidemia   Acute on chronic combined systolic and diastolic CHF (congestive heart failure) (HCC)   ESRD on hemodialysis (HCC)   Malnutrition of moderate degree   Hypotension   Paroxysmal atrial fibrillation with RVR ([place])- (present on admission) - Continue amiodarone and Eliquis - oral amio stopped on 1/5 as patient developed afib RVR again; did not respond to test dose of 2.5 mg IV lopressor and BP was already borderline, therefore cardiology started  back on amio IV drip evening of 1/5. Rate better on IV amiodarone but remains dependent -1/10 plan for DCCV today     Acute on chronic combined systolic and diastolic CHF (congestive heart failure) ([place]) - new echo this admission: EF 25-30 %.  Grade 2 DD. Patient underwent LHC showing nonobstructive CAD but "unusual coronary tree" with small LAD and very large dominant RCA 1/10 cardiology following Per cardiology will likely eventually need coronary CT to make sure we are not missing an anomalous vessel with unusual coronary tree on  cath Will likely need further medical management prior to discharge    Hypotension - Weaned off of Levophed while in the ICU however required midodrine to help with weaning.   1/10 blood pressure improving  Continue midodrine for now      ESRD on hemodialysis [facility]) Continue dialysis per nephrology    Acute respiratory failure with hypoxia (HCC)-resolved as of 02/26/2021 - Minimally responsive 12/29 and intubated for airway protection.  Extubated 12/30, stable today. Possible aspiration event with baseline OHS. Chest x-ray with increasing LLL airspace disease. Trach aspirate with normal flora, BC negative.  Seen by SLP, dysphagia 3 diet recommended.  Completed treatment with ceftriaxone Overall likely multifactorial in setting of CHF exacerbation initially currently on room air   Hyperlipidemia- (present on admission) - Continue Lipitor   Type 2 diabetes mellitus, controlled, with renal complications ([place])- (present on admission) - Ac 9.1% on 02/18/21 -Glucose levels better after carb restricting her diet GI times is labile Diabetic educator following Will add Levemir 5 units daily to start tomorrow as she is n.p.o. currently.  We will hold off on adding NovoLog with meals for now until we see what her BG's are with Levemir added    COPD (chronic obstructive pulmonary disease) ([place])- (present on admission) - no s/s exac   DVT prophylaxis:  Eliquis Code Status: Full Family Communication: None at bedside Disposition Plan: SNF Status is: Inpatient  Remains inpatient appropriate because: Severity of her illness causing the need for hospitalization.  Unsafe discharge            LOS: 12 days   Time spent: 35 minutes with more than 50% on [place], MD [facility] Pager [phone]  If 7PM-7AM, please contact night-coverage 03/03/2021, 10:13 AM

## 2021-03-03 NOTE — Progress Notes (Addendum)
Patient ID: Stephanie Woodward, female   DOB: 1940/08/22, 81 y.o.   MRN: [id number]     Advanced Heart Failure Rounding Note  PCP-Cardiologist: Ezzard Standing, MD   Subjective:    12/29: Found unresponsive, no arrhythmias noted. Intubated. Started on NE.  MRI brain without acute process. 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  Had fever to 101F. Started on broad spectrum abx and pan cultures sent.  12/30: CVVH started. Extubated.  Echo reviewed: EF 30-35% range, worse function in septum; mild RV dysfunction, IVC dilated.  01/01: CRRT stopped 01/03: LHC showed no obstructive CAD. 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  1/5: Midodrine cut back to 5 mg tid. 1/6: Back in A fib RVR. IV amio restarted. Midodrine increased to 10 mg tid.   Remains in A fib on amio 60 mg per hour.     Denies SOB. No complaints.   Objective:   Weight Range: 85 kg Body mass index is 33.2 kg/m.   Vital Signs:   Temp:  [97.1 F (36.2 C)-99 F (37.2 C)] 98 F (36.7 C) (01/10 0405) Pulse Rate:  [53-112] 99 (01/10 0405) Resp:  [13-20] 16 (01/10 0405) BP: (103-154)/(51-100) 136/70 (01/10 0405) SpO2:  [94 %-97 %] 94 % (01/10 0405) Weight:  [85 kg-85.5 kg] 85 kg (01/10 0405) Last BM Date: 03/01/21  Weight change: Filed Weights   03/02/21 0758 03/02/21 1210 03/03/21 0405  Weight: 87 kg 85.5 kg 85 kg    Intake/Output:   Intake/Output Summary (Last 24 hours) at 03/03/2021 0821 Last data filed at 03/03/2021 0436 Gross per 24 hour  Intake 1087.39 ml  Output 1500 ml  Net -412.61 ml       Physical Exam   General:  Sitting in the chair. No resp difficulty HEENT: normal Neck: supple. no JVD. Carotids 2+ bilat; no bruits. No lymphadenopathy or thryomegaly appreciated. Cor: PMI nondisplaced.  Irregular rate & rhythm. No rubs, gallops or murmurs. Lungs: clear Abdomen: soft, nontender, nondistended. No hepatosplenomegaly. No bruits or masses. Good bowel sounds. Extremities: no cyanosis, clubbing, rash, edema. LUE AVF  Neuro: alert & orientedx3, cranial nerves grossly intact. moves all 4 extremities w/o difficulty. Affect pleasant  Telemetry  A fib 80-100s had an episode in 140s briefly this morning.    Labs    CBC Recent Labs    03/02/21 0321 03/03/21 0048  WBC 10.9* 11.1*  NEUTROABS 7.7 7.9*  HGB 10.8* 10.6*  HCT 32.8* 32.0*  MCV 95.9 96.4  PLT 207 829   Basic Metabolic Panel Recent Labs    03/02/21 0321 03/03/21 0048  NA 135 137  K 3.5 3.5  CL 94* 98  CO2 25 24  GLUCOSE 134* 211*  BUN 31* 22  CREATININE 7.84* 5.80*  CALCIUM 9.3 8.9  MG 2.1 2.1  PHOS 4.8* 2.5   Liver Function Tests Recent Labs    03/02/21 0321 03/03/21 0048  ALBUMIN 2.5* 2.6*    No results for input(s): LIPASE, AMYLASE in the last 72 hours. Cardiac Enzymes No results for input(s): CKTOTAL, CKMB, CKMBINDEX, TROPONINI in the last 72 hours.  BNP: BNP (last 3 results) Recent Labs    06/08/20 1011  BNP 402.0*    ProBNP (last 3 results) No results for input(s): PROBNP in the last 8760 hours.   D-Dimer No results for input(s): DDIMER in the last  72 hours. Hemoglobin A1C No results for input(s): HGBA1C in the last 72 hours.  Fasting Lipid Panel No results for input(s): CHOL, HDL, LDLCALC, TRIG, CHOLHDL, LDLDIRECT in the last 72 hours.  Thyroid Function Tests No results for input(s): TSH, T4TOTAL, T3FREE, THYROIDAB in the last 72 hours.  Invalid input(s): FREET3   Other results:   Imaging    No results found.   Medications:     Scheduled Medications:  apixaban  5 mg Oral BID   atorvastatin  80 mg Oral QPM   Chlorhexidine Gluconate Cloth  6 each Topical Q0600   doxercalciferol  5 mcg Intravenous Q M,W,F-HD   feeding supplement (NEPRO CARB STEADY)  237 mL  Oral TID WC   insulin aspart  0-9 Units Subcutaneous TID AC & HS   latanoprost  1 drop Both Eyes QHS   lidocaine  1 patch Transdermal Q24H   midodrine  10 mg Oral TID WC   mometasone-formoterol  2 puff Inhalation BID   multivitamin  1 tablet Oral QHS   pantoprazole  20 mg Oral BID   QUEtiapine  25 mg Oral QHS   sodium chloride flush  3 mL Intravenous Q12H    Infusions:  sodium chloride Stopped (02/25/21 1953)   sodium chloride 20 mL/hr at 03/03/21 0436   amiodarone 60 mg/hr (03/03/21 0436)    PRN Medications: sodium chloride, acetaminophen, guaiFENesin-dextromethorphan, hydrALAZINE, ipratropium-albuterol, ondansetron (ZOFRAN) IV, ondansetron **OR** [DISCONTINUED] ondansetron (ZOFRAN) IV, senna-docusate, traMADol   Assessment/Plan   1.  Atrial fibrillation: Known hx paroxysmal AF.  Seen in ED at [facility] the week PTA for AF and spontaneously converted to SR.  Presented 02/17/21 with AF with RVR. Cardioverted in ER.  She was in junctional rhythm after respiratory arrest and intubation with rate upper 40s-50s. Rhythm on 1/3 appeared to be wandering atrial pacemaker, and amiodarone stopped.  Now back in AF with RVR and amiodarone restarted.  - Remains in A Fib with intermittent RVR. Continue amio 60 mg per hour. - Continue Eliquis.  - Plan DC-CV today.  2. Acute systolic CHF: Echo in 2/97 with EF 50-55%, mild LVH, grade 2 diastolic dysfunction.  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  [place] 11/23/17 showed significantly elevated right and left heart filling pressures and severe primarily pulmonary venous hypertension. Cardiac output preserved. Echo (10/19) with EF 45-50%, septal-lateral dyssynchrony, RV looked ok.  PYP scan was not suggestive of transthyretin amyloidosis.  Echo in 1/21 with EF 55-60%, normal RV.  RHC/LHC in 4/21 with no significant CAD, normal filling pressures and cardiac output.  Echo this admission  showed EF down to 35%.  HS-TnI 385 => 378, likely demand ischemia with hypotension/respiratory arrest.  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 Was on NE. Now off and on midodrine  - Leave off NE as long as SBP > 120 (runs low diastolic pressure).  - Continue midodrine 10 mg tid.  - No chest pain.  - Would like eventual coronary CT to make sure we are not missing an anomalous vessel with unusual coronary tree on cath.  3. Acute respiratory failure:  Minimally responsive 12/29 and intubated for airway protection.  Extubated 12/30, stable today. Possible aspiration event with baseline OHS. Chest x-ray with increasing LLL airspace disease. Trach aspirate with normal flora, BC negative.  Seen by SLP,  dysphagia 3 diet recommended.  - Completed tx w/ ceftriaxone -Resolved.  4. Acute encephalopathy: Head CT and MRI brain without acute process. Bilateral intracranial ICA stenosis (but no significant extracranial disease). EEG with no seizure activity. - Resolved.   5. ESRD: Followed by Dr. Hollie Salk as outpatient.  Volume status optimized on CVVH (stopped 1/1).  - Tolerating iHD. Management per nephrology  - Continue midodrine to 10 mg tid.  6. Pulmonary HTN: Mild PH on 4/21 RHC.  7. Hyperlipidemia: on atorvastatin 80.  8. DM II: A1c 9.1 - Per primary team 9. ID: Elevated WBCs, fever on 12/29. WBCs now trending down. ?Aspiration PNA.  - Vanc/zosyn switched to ceftriaxone. Completed 7 day course    DC/CV today at lunch.   Disposition : TOC following. Family requesting [place], NP-C  03/03/2021 8:21 AM  Patient seen with NP, agree with the above note.   Stable BP on midodrine 10 tid.  She was in atrial fibrillation with RVR.    She underwent DCCV today, now back in NSR in 50s-60s.    General: NAD Neck: No JVD,  no thyromegaly or thyroid nodule.  Lungs: Clear to auscultation bilaterally with normal respiratory effort. CV: Nondisplaced PMI.  Heart regular S1/S2, no S3/S4, no murmur.  No peripheral edema.   Abdomen: Soft, nontender, no hepatosplenomegaly, no distention.  Skin: Intact without lesions or rashes.  Neurologic: Alert and oriented x 3.  Psych: Normal affect. Extremities: No clubbing or cyanosis.  HEENT: Normal.   Transition to amiodarone 200 mg bid.  Continue Eliquis.   Loralie Champagne 03/03/2021 12:45 PM

## 2021-03-03 NOTE — Interval H&P Note (Signed)
History and Physical Interval Note:  03/03/2021 12:38 PM  Stephanie Woodward  has presented today for surgery, with the diagnosis of A-FIB.  The various methods of treatment have been discussed with the patient and family. After consideration of risks, benefits and other options for treatment, the patient has consented to  Procedure(s): CARDIOVERSION (N/A) as a surgical intervention.  The patient's history has been reviewed, patient examined, no change in status, stable for surgery.  I have reviewed the patient's chart and labs.  Questions were answered to the patient's satisfaction.     Timmey Lamba [employer]

## 2021-03-03 NOTE — TOC Progression Note (Signed)
Transition of Care [facility]) - Progression Note    Patient Details  Name: Stephanie Woodward MRN: [id number] Date of Birth: 1940-02-28  [facility] of [facility]) CM/SW Contact  Aliviyah Malanga, LCSW Phone Number: 03/03/2021, 5:11 PM  Clinical Narrative:    HF CSW received a message from [place], [place] reporting that they do have beds available for patient's ready to discharge. Once [name] is medically ready CSW to reach out to [facility], awaiting patient's progress.  CSW will continue to follow throughout discharge.   Expected Discharge Plan: Skilled Nursing Facility Barriers to Discharge: Continued Medical Work up  Expected Discharge Plan and Services Expected Discharge Plan: [place] In-house Referral: Clinical Social Work Discharge Planning Services: CM Consult Post Acute Care Choice: [place] Living arrangements for the past 2 months: Apartment                                       Social Determinants of Health (SDOH) Interventions Food Insecurity Interventions: Intervention Not Indicated Financial Strain Interventions: Intervention Not Indicated, Other (Comment) (Pt. denied any concerns about finances) Housing Interventions: Intervention Not Indicated (Pt. lives alone in an apartment with support from her daughters as needed) Transportation Interventions: Intervention Not Indicated, Other (Comment) (Pt. reports she is driving and doesn't have trouble getting to appointments)  Readmission Risk Interventions No flowsheet data found.  Conna Terada, MSW, [place] Heart Failure Social Worker

## 2021-03-04 ENCOUNTER — Encounter (HOSPITAL_COMMUNITY): Payer: Self-pay | Admitting: Cardiology

## 2021-03-04 DIAGNOSIS — I480 Paroxysmal atrial fibrillation: Secondary | ICD-10-CM | POA: Diagnosis not present

## 2021-03-04 DIAGNOSIS — I5043 Acute on chronic combined systolic (congestive) and diastolic (congestive) heart failure: Secondary | ICD-10-CM | POA: Diagnosis not present

## 2021-03-04 LAB — CBC WITH DIFFERENTIAL/PLATELET
Abs Immature Granulocytes: 0.04 10*3/uL (ref 0.00–0.07)
Basophils Absolute: 0.1 10*3/uL (ref 0.0–0.1)
Basophils Relative: 1 %
Eosinophils Absolute: 0.1 10*3/uL (ref 0.0–0.5)
Eosinophils Relative: 1 %
HCT: 31.7 % — ABNORMAL LOW (ref 36.0–46.0)
Hemoglobin: 10 g/dL — ABNORMAL LOW (ref 12.0–15.0)
Immature Granulocytes: 0 %
Lymphocytes Relative: 16 %
Lymphs Abs: 1.9 10*3/uL (ref 0.7–4.0)
MCH: 30.6 pg (ref 26.0–34.0)
MCHC: 31.5 g/dL (ref 30.0–36.0)
MCV: 96.9 fL (ref 80.0–100.0)
Monocytes Absolute: 1 10*3/uL (ref 0.1–1.0)
Monocytes Relative: 9 %
Neutro Abs: 8.2 10*3/uL — ABNORMAL HIGH (ref 1.7–7.7)
Neutrophils Relative %: 73 %
Platelets: 199 10*3/uL (ref 150–400)
RBC: 3.27 MIL/uL — ABNORMAL LOW (ref 3.87–5.11)
RDW: 15.9 % — ABNORMAL HIGH (ref 11.5–15.5)
WBC: 11.3 10*3/uL — ABNORMAL HIGH (ref 4.0–10.5)
nRBC: 0 % (ref 0.0–0.2)

## 2021-03-04 LAB — GLUCOSE, CAPILLARY
Glucose-Capillary: 196 mg/dL — ABNORMAL HIGH (ref 70–99)
Glucose-Capillary: 132 mg/dL — ABNORMAL HIGH (ref 70–99)
Glucose-Capillary: 216 mg/dL — ABNORMAL HIGH (ref 70–99)
Glucose-Capillary: 181 mg/dL — ABNORMAL HIGH (ref 70–99)

## 2021-03-04 LAB — RESP PANEL BY RT-PCR (FLU A&B, COVID) ARPGX2
Influenza A by PCR: NEGATIVE
Influenza B by PCR: NEGATIVE
SARS Coronavirus 2 by RT PCR: NEGATIVE

## 2021-03-04 LAB — RENAL FUNCTION PANEL
Albumin: 2.6 g/dL — ABNORMAL LOW (ref 3.5–5.0)
Anion gap: 16 — ABNORMAL HIGH (ref 5–15)
BUN: 28 mg/dL — ABNORMAL HIGH (ref 8–23)
CO2: 25 mmol/L (ref 22–32)
Calcium: 9.4 mg/dL (ref 8.9–10.3)
Chloride: 97 mmol/L — ABNORMAL LOW (ref 98–111)
Creatinine, Ser: 7.59 mg/dL — ABNORMAL HIGH (ref 0.44–1.00)
GFR, Estimated: 5 mL/min — ABNORMAL LOW (ref >60)
Glucose, Bld: 177 mg/dL — ABNORMAL HIGH (ref 70–99)
Phosphorus: 4 mg/dL (ref 2.5–4.6)
Potassium: 3.4 mmol/L — ABNORMAL LOW (ref 3.5–5.1)
Sodium: 138 mmol/L (ref 135–145)

## 2021-03-04 MED ORDER — AMIODARONE HCL 200 MG PO TABS
200.0000 mg | ORAL_TABLET | Freq: Two times a day (BID) | ORAL | Status: DC
  Administered 2021-03-04 – 2021-03-05 (×3): 200 mg via ORAL
  Filled 2021-03-04 (×3): qty 1

## 2021-03-04 MED ORDER — SODIUM CHLORIDE 0.9 % IV SOLN
62.5000 mg | INTRAVENOUS | Status: DC
  Administered 2021-03-04: 62.5 mg via INTRAVENOUS
  Filled 2021-03-04: qty 5

## 2021-03-04 MED ORDER — PENTAFLUOROPROP-TETRAFLUOROETH EX AERO: 1.0000 "application " | INHALATION_SPRAY | CUTANEOUS | Status: DC | PRN

## 2021-03-04 MED ORDER — SODIUM CHLORIDE 0.9 % IV SOLN: 100.0000 mL | INTRAVENOUS | Status: DC | PRN

## 2021-03-04 MED ORDER — LIDOCAINE-PRILOCAINE 2.5-2.5 % EX CREA: 1.0000 "application " | TOPICAL_CREAM | CUTANEOUS | Status: DC | PRN

## 2021-03-04 MED ORDER — LIDOCAINE HCL (PF) 1 % IJ SOLN: 5.0000 mL | INTRAMUSCULAR | Status: DC | PRN

## 2021-03-04 NOTE — Progress Notes (Signed)
Occupational Therapy Treatment Patient Details Name: Stephanie Woodward MRN: [id number] DOB: [birth year] Today's Date: 03/04/2021   History of present illness Pt adm 12/27 with SOB and found to be in afib with RVR. On 12/28 pt found unresponsive and transferred to ICU and intubated.  Pt placed on CRRT on 12/29. Head CT and MRI negative. Extubated 12/30. Cardioversion on 1/10. PMH - ESRD on HD, afib, chf, DM, copd, gout, chronic back pain   OT comments  Pt remains eager to participate in therapy sessions with focus on [facility] transfers/toileting tasks this AM prior to HD. Pt able to progress transfers to 1 person assist using RW with intermittent cues for sequencing and manuvering of DME. Pt requires assist for peri care (uses AE at baseline); educated on other strategies for toileting while pt's daughter briefly present during session. Reinforced HEP for B hands to improve coordination/strength with plans to educate in theraband exercises in next sessions. Continue to rec SNF rehab at DC.   Recommendations for follow up therapy are one component of a multi-disciplinary discharge planning process, led by the attending physician.  Recommendations may be updated based on patient status, additional functional criteria and insurance authorization.    Follow Up Recommendations  Skilled nursing-short term rehab (<3 hours/day)    Assistance Recommended at Discharge Intermittent Supervision/Assistance  Patient can return home with the following  A lot of help with walking and/or transfers;A lot of help with bathing/dressing/bathroom;Assistance with cooking/housework   Equipment Recommendations  Wheelchair (measurements OT);Wheelchair cushion (measurements OT);BSC/3in1    Recommendations for Other Services      Precautions / Restrictions Precautions Precautions: Fall Restrictions Weight Bearing Restrictions: No       Mobility Bed Mobility Overal bed mobility: Needs Assistance Bed Mobility: Sit to  Sidelying         Sit to sidelying: Mod assist General bed mobility comments: Mod A to get BLE back into bed, use of BUE on bedrail to guide trunk. repositioned in sidelying due to back pain    Transfers Overall transfer level: Needs assistance Equipment used: Rolling walker (2 wheels) Transfers: Sit to/from Stand;Bed to chair/wheelchair/BSC Sit to Stand: Mod assist   Step pivot transfers: Min assist       General transfer comment: Mod A to power up, cues to push from armrests, assist for RW manuevering to [facility]     Balance Overall balance assessment: Needs assistance Sitting-balance support: No upper extremity supported;Feet unsupported Sitting balance-Leahy Scale: Good     Standing balance support: Bilateral upper extremity supported Standing balance-Leahy Scale: Poor                             ADL either performed or assessed with clinical judgement   ADL Overall ADL's : Needs assistance/impaired                         Toilet Transfer: Minimal assistance;Stand-pivot;BSC/3in1;Rolling walker (2 wheels) Toilet Transfer Details (indicate cue type and reason): assist to manuever RW to turn to [place] Manipulation and Hygiene: Maximal assistance;Sit to/from stand;Sitting/lateral lean Toileting - Clothing Manipulation Details (indicate cue type and reason): able to assist with clothing mgmt, requires assist for peri care due to inability to reach (uses toileting aid at baseline)       General ADL Comments: Improving strength though endurance and coordination deficits still noted. Reports improved ability to self feed with built up grips (encouraged to continue  trial w/o AE to progress skills). Discussed bidet attachment at home if ever unable to use toileting aid (daughter present during this)    Extremity/Trunk Assessment Upper Extremity Assessment Upper Extremity Assessment: RUE deficits/detail;LUE deficits/detail RUE Deficits /  Details: impaired shoulder flexion at baseline, can lift to 45*,  slow manipulation of objects but improving; edema in hand RUE Coordination: decreased fine motor;decreased gross motor LUE Deficits / Details: weakness, slow manipulation of objects LUE Coordination: decreased fine motor   Lower Extremity Assessment Lower Extremity Assessment: Defer to PT evaluation        Vision   Vision Assessment?: No apparent visual deficits   Perception     Praxis      Cognition Arousal/Alertness: Awake/alert Behavior During Therapy: WFL for tasks assessed/performed;Flat affect Overall Cognitive Status: Impaired/Different from baseline Area of Impairment: Following commands;Safety/judgement;Awareness;Problem solving                       Following Commands: Follows one step commands with increased time;Follows one step commands consistently Safety/Judgement: Decreased awareness of deficits Awareness: Emergent Problem Solving: Slow processing;Requires verbal cues General Comments: benefits from cues for sequencing, hand placement, very motivated to participate          Exercises     Shoulder Instructions       General Comments HR 60-70s with activity, SpO2 100% on RA    Pertinent Vitals/ Pain       Pain Assessment: Faces Faces Pain Scale: Hurts a little bit Pain Location: chronic back pain Pain Descriptors / Indicators: Sore Pain Intervention(s): Monitored during session;Repositioned  Home Living                                          Prior Functioning/Environment              Frequency  Min 2X/week        Progress Toward Goals  OT Goals(current goals can now be found in the care plan section)  Progress towards OT goals: Progressing toward goals  Acute Rehab OT Goals Patient Stated Goal: get back home OT Goal Formulation: With patient Time For Goal Achievement: 03/07/21 Potential to Achieve Goals: Good ADL Goals Pt Will Perform  Grooming: with supervision;sitting Pt Will Transfer to Toilet: with min assist;bedside commode;stand pivot transfer Additional ADL Goal #1: pt will follow 2 step command 50% of session Additional ADL Goal #2: pt will complete bed mobility mod (A) as precursor to adls  Plan Discharge plan remains appropriate    Co-evaluation                 AM-PAC OT "6 Clicks" Daily Activity     Outcome Measure   Help from another person eating meals?: A Little Help from another person taking care of personal grooming?: A Little Help from another person toileting, which includes using toliet, bedpan, or urinal?: A Lot Help from another person bathing (including washing, rinsing, drying)?: A Lot Help from another person to put on and taking off regular upper body clothing?: A Little Help from another person to put on and taking off regular lower body clothing?: A Lot 6 Click Score: 15    End of Session Equipment Utilized During Treatment: Gait belt;Rolling walker (2 wheels)  OT Visit Diagnosis: Unsteadiness on feet (R26.81);Muscle weakness (generalized) (M62.81)   Activity Tolerance Patient tolerated treatment well   Patient  Left in bed;with call bell/phone within reach   Nurse Communication Mobility status (NT, small BM)        Time: 0703-0729 OT Time Calculation (min): 26 min  Charges: OT General Charges $OT Visit: 1 Visit OT Treatments $Self Care/Home Management : 23-37 mins  Malachy Chamber, OTR/L Acute Rehab Services Office: [phone]   Layla Maw 03/04/2021, 7:41 AM

## 2021-03-04 NOTE — TOC Progression Note (Addendum)
Transition of Care [facility]) - Progression Note    Patient Details  Name: Stephanie Woodward MRN: [id number] Date of Birth: Oct 25, 1940  [facility] CM/SW Contact  Asani Mcburney, LCSW Phone Number: 03/04/2021, 1:42 PM  Clinical Narrative:    HF CSW heard from the attending MD that [name] will be medically ready for discharge tomorrow. CSW reached out to [place] and notified the renal navigator about [name] discharging tomorrow to [place], [place]. HF CSW spoke with Star at [facility] who reported that she does have beds available and CSW updated Start about [name] outpatient HD with [facility]. CSW asked the attending for a COVID test before discharge.  Renal navigator, Olivia Mackie reported that the outpatient HD [place] will be MWF 12:10pm arrival and 12:25pm chair time. CSW provided this information to Star at [place], [place].  1:47pm - HF CSW spoke with the patient's daughter, [name] [phone] and informed her about the attending MD reporting that [name] will be medically ready for discharge tomorrow. [name] reported that she was told that discharge would take place next week and would like to speak to medical staff and CSW encouraged [name] to reach out and provided her the with bedside nurses telephone number.    HF CSW spoke to [facility] and they can extend the insurance authorization 1 day if [name] will discharge tomorrow. Approval for [facility] SNF is [phone] and for transportation [phone].  CSW will continue to follow throughout discharge.   Expected Discharge Plan: Skilled Nursing Facility Barriers to Discharge: Continued Medical Work up  Expected Discharge Plan and Services Expected Discharge Plan: [place] In-house Referral: Clinical Social Work Discharge Planning Services: CM Consult Post Acute Care Choice: [place] Living arrangements for the past 2 months: Apartment                                        Social Determinants of Health (SDOH) Interventions Food Insecurity Interventions: Intervention Not Indicated Financial Strain Interventions: Intervention Not Indicated, Other (Comment) (Pt. denied any concerns about finances) Housing Interventions: Intervention Not Indicated (Pt. lives alone in an apartment with support from her daughters as needed) Transportation Interventions: Intervention Not Indicated, Other (Comment) (Pt. reports she is driving and doesn't have trouble getting to appointments)  Readmission Risk Interventions No flowsheet data found.  Shaelynn Dragos, MSW, [place] Heart Failure Social Worker

## 2021-03-04 NOTE — Progress Notes (Signed)
PROGRESS NOTE    Stephanie Woodward  RXV:[id number] DOB: 1940-07-10 DOA: 02/17/2021 PCP: Larey Dresser, MD    Chief Complaint  Patient presents with   Atrial Fibrillation    Brief Narrative:   Stephanie Woodward is a 81 yo female with PMH PAF, CHF, ESRD on HD, COPD, IDDM, gout, HLD, HTN, sleep apnea who initially presented to the hospital on 02/17/2021 with shortness of breath and palpitations.  She was found to be in A. fib with RVR.  She had further decreased level of responsiveness and ultimately required intubation for airway protection on 02/19/2021.  She then developed hypotension requiring vasopressor support.  She was able to be extubated on 02/20/2021 and was started on midodrine to help with Levophed weaning.  She was managed by cardiology as well. She underwent LHC on 02/24/21 which showed nonobstructive CAD although an abnormal coronary tree.   Assessment & Plan:   Principal Problem:   Paroxysmal atrial fibrillation with RVR (HCC) Active Problems:   COPD (chronic obstructive pulmonary disease) (HCC)   Type 2 diabetes mellitus, controlled, with renal complications (HCC)   Hyperlipidemia   Acute on chronic combined systolic and diastolic CHF (congestive heart failure) (HCC)   ESRD on hemodialysis (HCC)   Malnutrition of moderate degree   Hypotension   Paroxysmal atrial fibrillation with RVR s/p DCCV Patient was on IV amiodarone transition to oral amiodarone and Eliquis.   Acute on chronic combined systolic and diastolic CHF Last echocardiogram showed left ventricular ejection fraction of 25 to 30% with grade 2 diastolic dysfunction.  Patient underwent left heart catheterization showing nonobstructive CAD but unusual coronary tree with small LAD in the large dominant RCA.   Hypotension:  Resolved.    ESRD on HD;  Further management as per nephrology.     Acute respiratory failure with hypoxia Intubated on 1229 for airway protection and extubated on 1230. Possible  aspiration event with baseline OHS. Completed the course of antibiotics.  SLP eval recommended dysphagia 3 diet Patient currently on room air without any shortness of breath or cough.    Hyperlipidemia Continue with the Lipitor.   COPD No wheezing heard on exam.   Type 2 diabetes mellitus with hyperglycemia Continue with sliding scale insulin. Last A1c is 9.1%.       DVT prophylaxis: Eliquis Code Status: (Full Code) Family Communication: none.  Disposition:   Status is: Inpatient  Remains inpatient appropriate because: iv amiodarone. SNF placement.        Consultants:  Nephrology Cardiology.   Procedures: none.  Antimicrobials:  Antibiotics Given (last 72 hours)     None         Subjective: No new complaints.   Objective: Vitals:   03/04/21 1030 03/04/21 1100 03/04/21 1130 03/04/21 1150  BP: (!) 111/48 (!) 104/48 (!) 106/53 (!) 127/54  Pulse:    (!) 55  Resp: 16 20 (!) 21 (!) 23  Temp:    (!) 97.5 F (36.4 C)  TempSrc:    Temporal  SpO2:    97%  Weight:    85.5 kg  Height:        Intake/Output Summary (Last 24 hours) at 03/04/2021 1246 Last data filed at 03/04/2021 1150 Gross per 24 hour  Intake 788 ml  Output 1500 ml  Net -712 ml   Filed Weights   03/03/21 1213 03/04/21 0744 03/04/21 1150  Weight: 84.8 kg 87.2 kg 85.5 kg    Examination:  General exam: Appears calm and comfortable  Respiratory system:  Clear to auscultation. Respiratory effort normal. Cardiovascular system: S1 & S2 heard, irregularly irregular.  Gastrointestinal system: Abdomen is nondistended, soft and nontender. Normal bowel sounds heard. Central nervous system: Alert and oriented. No focal neurological deficits. Extremities: Symmetric 5 x 5 power. Skin: No rashes, lesions or ulcers Psychiatry: Mood & affect appropriate.     Data Reviewed: I have personally reviewed following labs and imaging studies  CBC: Recent Labs  Lab 02/28/21 0255 03/01/21 0318  03/02/21 0321 03/03/21 0048 03/04/21 0153  WBC 10.3 11.0* 10.9* 11.1* 11.3*  NEUTROABS 7.3 7.9* 7.7 7.9* 8.2*  HGB 11.1* 10.9* 10.8* 10.6* 10.0*  HCT 34.1* 34.2* 32.8* 32.0* 31.7*  MCV 96.6 97.7 95.9 96.4 96.9  PLT 215 217 207 195 503    Basic Metabolic Panel: Recent Labs  Lab 02/27/21 0159 02/28/21 0255 03/01/21 0318 03/02/21 0321 03/03/21 0048 03/04/21 0153  NA 136 135 134* 135 137 138  K 3.4* 3.5 3.3* 3.5 3.5 3.4*  CL 100 98 94* 94* 98 97*  CO2 25 28 25 25 24 25   GLUCOSE 206* 300* 155* 134* 211* 177*  BUN 25* 18 25* 31* 22 28*  CREATININE 6.48* 4.47* 6.32* 7.84* 5.80* 7.59*  CALCIUM 9.3 9.1 9.4 9.3 8.9 9.4  MG 2.4 2.2 2.2 2.1 2.1  --   PHOS  --   --  4.2 4.8* 2.5 4.0    GFR: Estimated Creatinine Clearance: 6.2 mL/min (A) (by C-G formula based on SCr of 7.59 mg/dL (H)).  Liver Function Tests: Recent Labs  Lab 03/01/21 0318 03/02/21 0321 03/03/21 0048 03/04/21 0153  ALBUMIN 2.6* 2.5* 2.6* 2.6*    CBG: Recent Labs  Lab 03/03/21 1135 03/03/21 1543 03/03/21 2139 03/04/21 0726 03/04/21 1230  GLUCAP 198* 182* 194* 196* 132*     No results found for this or any previous visit (from the past 240 hour(s)).       Radiology Studies: No results found.      Scheduled Meds:  amiodarone  200 mg Oral BID   apixaban  5 mg Oral BID   atorvastatin  80 mg Oral QPM   Chlorhexidine Gluconate Cloth  6 each Topical Q0600   doxercalciferol  5 mcg Intravenous Q M,W,F-HD   feeding supplement (NEPRO CARB STEADY)  237 mL Oral TID WC   insulin aspart  0-9 Units Subcutaneous TID AC & HS   insulin detemir  5 Units Subcutaneous Daily   latanoprost  1 drop Both Eyes QHS   lidocaine  1 patch Transdermal Q24H   midodrine  10 mg Oral TID WC   mometasone-formoterol  2 puff Inhalation BID   multivitamin  1 tablet Oral QHS   pantoprazole  20 mg Oral BID   QUEtiapine  25 mg Oral QHS   sodium chloride flush  3 mL Intravenous Q12H   Continuous Infusions:  sodium  chloride Stopped (02/25/21 1953)   ferric gluconate (FERRLECIT) IVPB Stopped (03/04/21 1142)     LOS: 13 days       Hosie Poisson, MD [facility]   To contact the attending provider between 7A-7P or the covering provider during after hours 7P-7A, please log into the web site [email or web address] and access using universal [place] password for that web site. If you do not have the password, please call the hospital operator.  03/04/2021, 12:46 PM

## 2021-03-04 NOTE — Progress Notes (Signed)
Physical Therapy Treatment Patient Details Name: Stephanie Woodward MRN: [id number] DOB: May 23, 1940 Today's Date: 03/04/2021   History of Present Illness Pt adm 12/27 with SOB and found to be in afib with RVR. On 12/28 pt found unresponsive and transferred to ICU and intubated.  Pt placed on CRRT on 12/29. Head CT and MRI negative. Extubated 12/30. PMH - ESRD on HD, afib, chf, DM, copd, gout, chronic back pain    PT Comments    Pt sitting up in chair trying to order dinner, assisted her in dialing. Afterward pt eager to work with therapy. Pt reports that dialysis was not as good today and that she is experiencing pins and needles in her right hand that she has not had in the past. Pt determined to do as much for herself as she can, however requires min A for sit>stand and initially requires min A for steadying with gait. Pt able to ambulate about 15 feet before fatigue is too much and she has to sit. Pt reports she is tired. D/c plans remain appropriate at this time.     Recommendations for follow up therapy are one component of a multi-disciplinary discharge planning process, led by the attending physician.  Recommendations may be updated based on patient status, additional functional criteria and insurance authorization.  Follow Up Recommendations  Skilled nursing-short term rehab (<3 hours/day)     Assistance Recommended at Discharge Frequent or constant Supervision/Assistance     Equipment Recommendations  Wheelchair (measurements PT);Wheelchair cushion (measurements PT)       Precautions / Restrictions Precautions Precautions: Fall Restrictions Weight Bearing Restrictions: No     Mobility  Bed Mobility               General bed mobility comments: OOB in recliner    Transfers Overall transfer level: Needs assistance Equipment used: Rollator (4 wheels) Transfers: Sit to/from Stand Sit to Stand: Min assist           General transfer comment: pt wants to attempt to  come to standing on her own, vc for scooting hips to edge of recliner, abltr to begin power up but requires min A for full power up to standing and increased time to bring R UE up from recliner armrest to RW    Ambulation/Gait Ambulation/Gait assistance: +2 safety/equipment;Min guard;Min assist Gait Distance (Feet): 15 Feet Assistive device: Rolling walker (2 wheels) Gait Pattern/deviations: Decreased step length - right;Decreased step length - left;Shuffle;Trunk flexed;Step-through pattern Gait velocity: decr Gait velocity interpretation: <1.31 ft/sec, indicative of household ambulator   General Gait Details: min A for initial steadying with gait but ultimately min guard for ambulation, requires close chair follow due to rapid onset of fatigue      Balance Overall balance assessment: Needs assistance Sitting-balance support: No upper extremity supported;Feet unsupported Sitting balance-Leahy Scale: Fair     Standing balance support: Bilateral upper extremity supported Standing balance-Leahy Scale: Poor Standing balance comment: walker and min guard for static standing                            Cognition Arousal/Alertness: Awake/alert Behavior During Therapy: WFL for tasks assessed/performed Overall Cognitive Status: Impaired/Different from baseline Area of Impairment: Following commands;Safety/judgement;Awareness;Problem solving                       Following Commands: Follows multi-step commands with increased time;Follows multi-step commands consistently   Awareness: Emergent;Anticipatory Problem Solving: Slow processing;Requires verbal  cues General Comments: requires increased time however is able to follow commands appropriately        Exercises General Exercises - Lower Extremity Long Arc Quad: AROM;Both;5 reps;Seated Hip Flexion/Marching: AROM;Both;5 reps    General Comments General comments (skin integrity, edema, etc.): HR 60-70s with  ambulation, SpO2 >90%O2 throughout session      Pertinent Vitals/Pain Pain Assessment: Faces Pain Location: R hand Pain Descriptors / Indicators: Pins and needles Pain Intervention(s): Limited activity within patient's tolerance;Monitored during session;Repositioned     PT Goals (current goals can now be found in the care plan section) Acute Rehab PT Goals PT Goal Formulation: With patient Time For Goal Achievement: 03/07/21 Potential to Achieve Goals: Fair Progress towards PT goals: Progressing toward goals    Frequency    Min 3X/week      PT Plan Current plan remains appropriate       AM-PAC PT "6 Clicks" Mobility   Outcome Measure  Help needed turning from your back to your side while in a flat bed without using bedrails?: A Lot Help needed moving from lying on your back to sitting on the side of a flat bed without using bedrails?: A Lot Help needed moving to and from a bed to a chair (including a wheelchair)?: A Lot Help needed standing up from a chair using your arms (e.g., wheelchair or bedside chair)?: A Lot Help needed to walk in hospital room?: Total Help needed climbing 3-5 steps with a railing? : Total 6 Click Score: 10    End of Session Equipment Utilized During Treatment: Gait belt Activity Tolerance: Patient tolerated treatment well Patient left: in chair;with call bell/phone within reach;with chair alarm set Nurse Communication: Mobility status PT Visit Diagnosis: Other abnormalities of gait and mobility (R26.89);Muscle weakness (generalized) (M62.81);Difficulty in walking, not elsewhere classified (R26.2)     Time: 1103-1594 PT Time Calculation (min) (ACUTE ONLY): 18 min  Charges:  $Gait Training: 8-22 mins                     Beretta Ginsberg [name] PT, DPT Acute Rehabilitation Services Pager [phone] Office [phone]    [place] 03/04/2021, 1:55 PM

## 2021-03-04 NOTE — Progress Notes (Addendum)
Patient ID: Stephanie Woodward, female   DOB: 13-Aug-1940, 81 y.o.   MRN: [id number]     Advanced Heart Failure Rounding Note  PCP-Cardiologist: Ezzard Standing, MD   Subjective:    12/29: Found unresponsive, no arrhythmias noted. Intubated. Started on NE.  MRI brain without acute process. 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  Had fever to 101F. Started on broad spectrum abx and pan cultures sent.  12/30: CVVH started. Extubated.  Echo reviewed: EF 30-35% range, worse function in septum; mild RV dysfunction, IVC dilated.  01/01: CRRT stopped 01/03: LHC showed no obstructive CAD. 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  1/5: Midodrine cut back to 5 mg tid. 1/6: Back in A fib RVR. IV amio restarted. Midodrine increased to 10 mg tid.  1/10: DCCV>>NSR   Maintaining NSR/SB, HR upper 50s-low 60s.   Getting HD today. BP stable on midodrine 10 tid.    Objective:   Weight Range: 87.2 kg Body mass index is 35.16 kg/m.   Vital Signs:   Temp:  [97.2 F (36.2 C)-97.9 F (36.6 C)] 97.5 F (36.4 C) (01/11 0744) Pulse Rate:  [54-88] 70 (01/11 0830) Resp:  [12-25] 21 (01/11 1130) BP: (100-151)/(32-84) 106/53 (01/11 1130) SpO2:  [92 %-98 %] 95 % (01/11 0755) Weight:  [84.8 kg-87.2 kg] 87.2 kg (01/11 0744) Last BM Date: 03/03/21  Weight change: Filed Weights   03/03/21 0405 03/03/21 1213 03/04/21 0744  Weight: 85 kg 84.8 kg 87.2 kg    Intake/Output:   Intake/Output Summary (Last 24 hours) at 03/04/2021 1144 Last data filed at 03/04/2021 0317 Gross per 24 hour  Intake 788 ml  Output --  Net 788 ml      Physical Exam   General:  Well appearing. No respiratory difficulty HEENT: normal Neck: supple. no JVD. Carotids 2+ bilat; no bruits. No lymphadenopathy or thyromegaly  appreciated. Cor: PMI nondisplaced. Regular rate & rhythm. No rubs, gallops or murmurs. Lungs: clear Abdomen: soft, nontender, nondistended. No hepatosplenomegaly. No bruits or masses. Good bowel sounds. Extremities: no cyanosis, clubbing, rash, edema Neuro: alert & oriented x 3, cranial nerves grossly intact. moves all 4 extremities w/o difficulty. Affect pleasant.   Telemetry   NSR/SB, HR upper 50s-low 60s.    Labs    CBC Recent Labs    03/03/21 0048 03/04/21 0153  WBC 11.1* 11.3*  NEUTROABS 7.9* 8.2*  HGB 10.6* 10.0*  HCT 32.0* 31.7*  MCV 96.4 96.9  PLT 195 097   Basic Metabolic Panel Recent Labs    03/02/21 0321 03/03/21 0048 03/04/21 0153  NA 135 137 138  K 3.5 3.5 3.4*  CL 94* 98 97*  CO2 25 24 25   GLUCOSE 134* 211* 177*  BUN 31* 22 28*  CREATININE 7.84* 5.80* 7.59*  CALCIUM 9.3 8.9 9.4  MG 2.1 2.1  --   PHOS 4.8* 2.5 4.0   Liver Function Tests Recent Labs    03/03/21 0048 03/04/21 0153  ALBUMIN 2.6* 2.6*    No results for input(s): LIPASE, AMYLASE in the last 72 hours. Cardiac Enzymes No results for input(s): CKTOTAL, CKMB, CKMBINDEX, TROPONINI in the last 72 hours.  BNP: BNP (last 3 results) Recent Labs    06/08/20 1011  BNP 402.0*    ProBNP (last 3 results) No results for input(s): PROBNP in the last 8760 hours.   D-Dimer  No results for input(s): DDIMER in the last 72 hours. Hemoglobin A1C No results for input(s): HGBA1C in the last 72 hours.  Fasting Lipid Panel No results for input(s): CHOL, HDL, LDLCALC, TRIG, CHOLHDL, LDLDIRECT in the last 72 hours.  Thyroid Function Tests No results for input(s): TSH, T4TOTAL, T3FREE, THYROIDAB in the last 72 hours.  Invalid input(s): FREET3   Other results:   Imaging    No results found.   Medications:     Scheduled Medications:  amiodarone  200 mg Oral BID   apixaban  5 mg Oral BID   atorvastatin  80 mg Oral QPM   Chlorhexidine Gluconate Cloth  6 each Topical Q0600    doxercalciferol  5 mcg Intravenous Q M,W,F-HD   feeding supplement (NEPRO CARB STEADY)  237 mL Oral TID WC   insulin aspart  0-9 Units Subcutaneous TID AC & HS   insulin detemir  5 Units Subcutaneous Daily   latanoprost  1 drop Both Eyes QHS   lidocaine  1 patch Transdermal Q24H   midodrine  10 mg Oral TID WC   mometasone-formoterol  2 puff Inhalation BID   multivitamin  1 tablet Oral QHS   pantoprazole  20 mg Oral BID   QUEtiapine  25 mg Oral QHS   sodium chloride flush  3 mL Intravenous Q12H    Infusions:  sodium chloride Stopped (02/25/21 1953)   sodium chloride     sodium chloride     ferric gluconate (FERRLECIT) IVPB Stopped (03/04/21 1142)    PRN Medications: sodium chloride, sodium chloride, sodium chloride, acetaminophen, guaiFENesin-dextromethorphan, hydrALAZINE, ipratropium-albuterol, lidocaine (PF), lidocaine-prilocaine, ondansetron (ZOFRAN) IV, ondansetron **OR** [DISCONTINUED] ondansetron (ZOFRAN) IV, pentafluoroprop-tetrafluoroeth, senna-docusate, traMADol   Assessment/Plan   1.  Atrial fibrillation: Known hx paroxysmal AF.  Seen in ED at [facility] the week PTA for AF and spontaneously converted to SR.  Presented 02/17/21 with AF with RVR. Cardioverted in ER.  She was in junctional rhythm after respiratory arrest and intubation with rate upper 40s-50s. Rhythm on 1/3 appeared to be wandering atrial pacemaker, and amiodarone stopped.  Reverted back tp AF with RVR and amiodarone restarted. S/p DCCV 1/10 back to NSR. Maintaining SR  - Continue amiodarone 200 mg bid  - Continue Eliquis 5 mg bid  2. Acute systolic CHF: Echo in 2/54 with EF 50-55%, mild LVH, grade 2 diastolic dysfunction.  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  [place] 11/23/17 showed significantly elevated right and left heart filling pressures and severe primarily pulmonary venous hypertension. Cardiac output preserved. Echo (10/19) with EF  45-50%, septal-lateral dyssynchrony, RV looked ok.  PYP scan was not suggestive of transthyretin amyloidosis.  Echo in 1/21 with EF 55-60%, normal RV.  RHC/LHC in 4/21 with no significant CAD, normal filling pressures and cardiac output.  Echo this admission showed EF down to 35%.  HS-TnI 385 => 378, likely demand ischemia with hypotension/respiratory arrest.  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 Was on NE. Now off and on midodrine  - Leave off NE as long as SBP > 120 (runs low diastolic pressure).  - Continue midodrine 10 mg tid.  - No chest pain.  - Would like eventual coronary CT to make sure we are not missing an anomalous vessel with unusual coronary tree on cath.  3. Acute respiratory failure:  Minimally responsive 12/29 and intubated for airway protection.  Extubated 12/30, stable today. Possible aspiration event with baseline OHS. Chest x-ray with increasing LLL airspace disease. Trach aspirate with normal flora, BC negative.  Seen by SLP, dysphagia 3 diet recommended.  - Completed tx w/ ceftriaxone -Resolved.  4. Acute encephalopathy: Head CT and MRI brain without acute process. Bilateral intracranial ICA stenosis (but no significant extracranial disease). EEG with no seizure activity. - Resolved.   5. ESRD: Followed by Dr. Hollie Salk as outpatient.  Volume status optimized on CVVH (stopped 1/1).  - Tolerating iHD. Management per nephrology  - Continue midodrine to 10 mg tid.  6. Pulmonary HTN: Mild PH on 4/21 RHC.  7. Hyperlipidemia: on atorvastatin 80.  8. DM II: A1c 9.1 - Per primary team 9. ID: Elevated WBCs, fever on 12/29. WBCs now trending down. ?Aspiration PNA.  - Vanc/zosyn switched to ceftriaxone. Completed 7 day course     Disposition : TOC following. Family requesting [place],  [place]  03/04/2021 11:44 AM  Agree with the above PA note.   She remains in NSR today, still on amiodarone gtt.  HR in 50s-60s.  No complaints.  To get HD today.   General: NAD Neck: JVP 8 cm, no thyromegaly or thyroid nodule.  Lungs: Clear to auscultation bilaterally with normal respiratory effort. CV: Nondisplaced PMI.  Heart regular S1/S2, no S3/S4, no murmur.  No peripheral edema.   Abdomen: Soft, nontender, no hepatosplenomegaly, no distention.  Skin: Intact without lesions or rashes.  Neurologic: Alert and oriented x 3.  Psych: Normal affect. Extremities: No clubbing or cyanosis.  HEENT: Normal.   She is maintaining NSR, transition to po amiodarone 200 mg bid x 10 days then 200 mg daily.  Continue Eliquis.   HD for volume management.  BP stable, she remains on midodrine.   Should be ready for SNF soon.   Loralie Champagne 03/04/2021

## 2021-03-04 NOTE — Progress Notes (Signed)
SLP Cancellation Note  Patient Details Name: Berklie Dethlefs MRN: [id number] DOB: 02-13-1941   Cancelled treatment:       Reason Eval/Treat Not Completed: Patient at procedure or test/unavailable (HD). Will continue to follow.     Osie Bond., M.A. [facility] Pager [phone] Office [phone]  03/04/2021, 12:02 PM

## 2021-03-04 NOTE — Progress Notes (Signed)
Patient picked for dialysis, on Amiodarone drip, telemetry and room air.

## 2021-03-04 NOTE — Progress Notes (Signed)
Speech Language Pathology Treatment: Dysphagia  Patient Details Name: Stephanie Woodward MRN: [id number] DOB: [birth year] Today's Date: 03/04/2021 Time: 4037-5436 SLP Time Calculation (min) (ACUTE ONLY): 10 min  Assessment / Plan / Recommendation Clinical Impression  Pt continues to say that she likes her mechanical soft diet. She consumed small bites of regular solids today with adequate appearing oral preparation, but also with intermittent throat clearing and occasional delayed coughing. This seemed to be alleviated with a small sip of water. Given that pt had normal pharyngeal function on FEES this admission, continue to suspect that coughing is not necessarily related to aspiration, but could be related to possible esophageal dysphagia. SLP reviewed aspiration and esophageal precautions with pt, and she seems to implement them well across intake. Pt declines any diet advancement now or in the near future, saying that she likes the current texture because she feels like it would be hard for her to cut her food herself at the moment. Will therefore leave her on mechanical soft diet and thin liquids, and will defer any additional f/u to SLP at next level of care (plan to d/c to SNF). Pt is in agreement with plan.    HPI HPI: Pt adm 12/27 with SOB and found to be in afib with RVR. On 12/28 pt found unresponsive and transferred to ICU and intubated.  Pt placed on CRRT on 12/29. Head CT and MRI negative. Extubated 12/30. PMH - ESRD on HD, afib, chf, DM, copd, gout. Daughters report some coughing with meals PTA>      SLP Plan  All goals met      Recommendations for follow up therapy are one component of a multi-disciplinary discharge planning process, led by the attending physician.  Recommendations may be updated based on patient status, additional functional criteria and insurance authorization.    Recommendations  Diet recommendations: Dysphagia 3 (mechanical soft);Thin liquid Liquids provided  via: Cup;Straw Medication Administration: Whole meds with puree Supervision: Staff to assist with self feeding Compensations: Minimize environmental distractions;Slow rate;Small sips/bites;Follow solids with liquid Postural Changes and/or Swallow Maneuvers: Seated upright 90 degrees;Upright 30-60 min after meal                Oral Care Recommendations: Oral care BID Follow Up Recommendations: Skilled nursing-short term rehab (<3 hours/day) Assistance recommended at discharge: Intermittent Supervision/Assistance SLP Visit Diagnosis: Dysphagia, unspecified (R13.10) Plan: All goals met           Osie Bond., M.A. [place] Pager [phone] Office [phone]  03/04/2021, 5:11 PM

## 2021-03-04 NOTE — Progress Notes (Addendum)
[facility] Progress Note   Subjective: Seen on HD. C/O numbness in fingers distal to AVF. Improved with reduction of BFR. Hand is warm, pulses present. SB on monitor.   Objective Vitals:   03/04/21 0830 03/04/21 0900 03/04/21 0930 03/04/21 1000  BP: (!) 104/45 (!) 125/59 (!) 121/54 (!) 108/48  Pulse: 70     Resp: (!) 24 18 20 18   Temp:      TempSrc:      SpO2:      Weight:      Height:       General: elderly female in bed in NAD   HEENT NCAT Neck: supple trachea midline Heart: S1S2 no M/R/G SB on monitor.  Lungs: Clear to auscultation; unlabored on room air  Abdomen: soft/nt/obese habitus Extremities: No edema lower extremities Psych normal mood and affect Neuro -alert and oriented x 3 provides hx and follows commands Dialysis Access: LUE AVF with bruit and thrill    Additional Objective Labs: Basic Metabolic Panel: Recent Labs  Lab 03/02/21 0321 03/03/21 0048 03/04/21 0153  NA 135 137 138  K 3.5 3.5 3.4*  CL 94* 98 97*  CO2 25 24 25   GLUCOSE 134* 211* 177*  BUN 31* 22 28*  CREATININE 7.84* 5.80* 7.59*  CALCIUM 9.3 8.9 9.4  PHOS 4.8* 2.5 4.0   Liver Function Tests: Recent Labs  Lab 03/02/21 0321 03/03/21 0048 03/04/21 0153  ALBUMIN 2.5* 2.6* 2.6*   No results for input(s): LIPASE, AMYLASE in the last 168 hours. CBC: Recent Labs  Lab 02/28/21 0255 03/01/21 0318 03/02/21 0321 03/03/21 0048 03/04/21 0153  WBC 10.3 11.0* 10.9* 11.1* 11.3*  NEUTROABS 7.3 7.9* 7.7 7.9* 8.2*  HGB 11.1* 10.9* 10.8* 10.6* 10.0*  HCT 34.1* 34.2* 32.8* 32.0* 31.7*  MCV 96.6 97.7 95.9 96.4 96.9  PLT 215 217 207 195 199   Blood Culture    Component Value Date/Time   SDES TRACHEAL ASPIRATE 02/19/2021 1052   SPECREQUEST NONE 02/19/2021 1052   CULT  02/19/2021 1052    MODERATE Normal respiratory flora-no Staph aureus or Pseudomonas seen Performed at [facility] [address], [place], [place] [address]    REPTSTATUS 02/21/2021 FINAL 02/19/2021  1052    Cardiac Enzymes: No results for input(s): CKTOTAL, CKMB, CKMBINDEX, TROPONINI in the last 168 hours. CBG: Recent Labs  Lab 03/03/21 0736 03/03/21 1135 03/03/21 1543 03/03/21 2139 03/04/21 0726  GLUCAP 183* 198* 182* 194* 196*   Iron Studies: No results for input(s): IRON, TIBC, TRANSFERRIN, FERRITIN in the last 72 hours. @lablastinr3 @ Studies/Results: No results found. Medications:  sodium chloride Stopped (02/25/21 1953)   sodium chloride     sodium chloride     amiodarone 60 mg/hr (03/04/21 0651)    apixaban  5 mg Oral BID   atorvastatin  80 mg Oral QPM   Chlorhexidine Gluconate Cloth  6 each Topical Q0600   doxercalciferol  5 mcg Intravenous Q M,W,F-HD   feeding supplement (NEPRO CARB STEADY)  237 mL Oral TID WC   insulin aspart  0-9 Units Subcutaneous TID AC & HS   insulin detemir  5 Units Subcutaneous Daily   latanoprost  1 drop Both Eyes QHS   lidocaine  1 patch Transdermal Q24H   midodrine  10 mg Oral TID WC   mometasone-formoterol  2 puff Inhalation BID   multivitamin  1 tablet Oral QHS   pantoprazole  20 mg Oral BID   QUEtiapine  25 mg Oral QHS   sodium  chloride flush  3 mL Intravenous Q12H     OP HD:  [facility]  (will be going to MWF schedule at [facility] for SNF)   4h  350/500  87.2kg  2/2 bath  P2  Hep none   - hect 5 ug tiw   - venofer 50 q wk  - no esa      Assessment/Plan: ESRD - CRRT started 12/30 and stopped 1/1.  HD is now per a MWF schedule (per request of her new SNF). Next HD 03/06/2021.  AMS - delirium. Neuro consulted. resolved Acute resp insufficiency - extubated 12/30. +fever/ ^wbc/ pulm infiltrates. S/p broad spectrum IV abx w/ vanc/ zosyn. Hypotension/Volume - Increased midodrine back to 10 mg TID.  May ultimately need to plan to decrease to midodrine prior to HD. Extra volume removed w/ CRRT and was under EDW after that. Arrived to HD today at OP EDW. Lower EDW on DC. Optimize volume with HD.  Paroxysmal Afib - cardioverted in ED  12/27.  S/p IV amio as above and on amio gtt during last HD.  Went to DC cardioversion 03/03/2021. Now in SR/SB. Per primary team and cardiology. 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 Not on ESA as OP. Order weekly venofer for today.  MBD ckd - Cont hectorol here. No binders on home med list and phos at goal  [place]. Reign Bartnick NP-C 03/04/2021, 10:05 AM  [employer] [phone]

## 2021-03-04 NOTE — Progress Notes (Signed)
PT Cancellation Note  Patient Details Name: Stephanie Woodward MRN: [id number] DOB: Oct 29, 1940   Cancelled Treatment:    Reason Eval/Treat Not Completed: Patient at procedure or test/unavailable Pt is currently at HD. PT will follow back this afternoon.  Zandyr Barnhill [name] PT, DPT Acute Rehabilitation Services Pager [phone] Office [phone]   [place] 03/04/2021, 8:24 AM

## 2021-03-05 DIAGNOSIS — I5022 Chronic systolic (congestive) heart failure: Secondary | ICD-10-CM | POA: Diagnosis not present

## 2021-03-05 DIAGNOSIS — E662 Morbid (severe) obesity with alveolar hypoventilation: Secondary | ICD-10-CM | POA: Diagnosis not present

## 2021-03-05 DIAGNOSIS — M1990 Unspecified osteoarthritis, unspecified site: Secondary | ICD-10-CM | POA: Diagnosis not present

## 2021-03-05 DIAGNOSIS — G9341 Metabolic encephalopathy: Secondary | ICD-10-CM | POA: Diagnosis not present

## 2021-03-05 DIAGNOSIS — J690 Pneumonitis due to inhalation of food and vomit: Secondary | ICD-10-CM | POA: Diagnosis not present

## 2021-03-05 DIAGNOSIS — E785 Hyperlipidemia, unspecified: Secondary | ICD-10-CM | POA: Diagnosis not present

## 2021-03-05 DIAGNOSIS — I480 Paroxysmal atrial fibrillation: Secondary | ICD-10-CM | POA: Diagnosis not present

## 2021-03-05 DIAGNOSIS — I272 Pulmonary hypertension, unspecified: Secondary | ICD-10-CM | POA: Diagnosis not present

## 2021-03-05 DIAGNOSIS — I509 Heart failure, unspecified: Secondary | ICD-10-CM | POA: Diagnosis not present

## 2021-03-05 DIAGNOSIS — R52 Pain, unspecified: Secondary | ICD-10-CM | POA: Diagnosis not present

## 2021-03-05 DIAGNOSIS — D631 Anemia in chronic kidney disease: Secondary | ICD-10-CM | POA: Diagnosis not present

## 2021-03-05 DIAGNOSIS — E1169 Type 2 diabetes mellitus with other specified complication: Secondary | ICD-10-CM | POA: Diagnosis not present

## 2021-03-05 DIAGNOSIS — E440 Moderate protein-calorie malnutrition: Secondary | ICD-10-CM | POA: Diagnosis not present

## 2021-03-05 DIAGNOSIS — Z992 Dependence on renal dialysis: Secondary | ICD-10-CM | POA: Diagnosis not present

## 2021-03-05 DIAGNOSIS — I2510 Atherosclerotic heart disease of native coronary artery without angina pectoris: Secondary | ICD-10-CM | POA: Diagnosis not present

## 2021-03-05 DIAGNOSIS — F5101 Primary insomnia: Secondary | ICD-10-CM | POA: Diagnosis not present

## 2021-03-05 DIAGNOSIS — R41841 Cognitive communication deficit: Secondary | ICD-10-CM | POA: Diagnosis not present

## 2021-03-05 DIAGNOSIS — M6281 Muscle weakness (generalized): Secondary | ICD-10-CM | POA: Diagnosis not present

## 2021-03-05 DIAGNOSIS — I5032 Chronic diastolic (congestive) heart failure: Secondary | ICD-10-CM | POA: Diagnosis not present

## 2021-03-05 DIAGNOSIS — I5040 Unspecified combined systolic (congestive) and diastolic (congestive) heart failure: Secondary | ICD-10-CM | POA: Diagnosis not present

## 2021-03-05 DIAGNOSIS — I959 Hypotension, unspecified: Secondary | ICD-10-CM | POA: Diagnosis not present

## 2021-03-05 DIAGNOSIS — Z7901 Long term (current) use of anticoagulants: Secondary | ICD-10-CM | POA: Diagnosis not present

## 2021-03-05 DIAGNOSIS — U071 COVID-19: Secondary | ICD-10-CM | POA: Diagnosis not present

## 2021-03-05 DIAGNOSIS — J9601 Acute respiratory failure with hypoxia: Secondary | ICD-10-CM | POA: Diagnosis not present

## 2021-03-05 DIAGNOSIS — I10 Essential (primary) hypertension: Secondary | ICD-10-CM | POA: Diagnosis not present

## 2021-03-05 DIAGNOSIS — M6258 Muscle wasting and atrophy, not elsewhere classified, other site: Secondary | ICD-10-CM | POA: Diagnosis not present

## 2021-03-05 DIAGNOSIS — Z6834 Body mass index (BMI) 34.0-34.9, adult: Secondary | ICD-10-CM | POA: Diagnosis not present

## 2021-03-05 DIAGNOSIS — Z87891 Personal history of nicotine dependence: Secondary | ICD-10-CM | POA: Diagnosis not present

## 2021-03-05 DIAGNOSIS — Z20822 Contact with and (suspected) exposure to covid-19: Secondary | ICD-10-CM | POA: Diagnosis not present

## 2021-03-05 DIAGNOSIS — N186 End stage renal disease: Secondary | ICD-10-CM | POA: Diagnosis not present

## 2021-03-05 DIAGNOSIS — I132 Hypertensive heart and chronic kidney disease with heart failure and with stage 5 chronic kidney disease, or end stage renal disease: Secondary | ICD-10-CM | POA: Diagnosis not present

## 2021-03-05 DIAGNOSIS — R531 Weakness: Secondary | ICD-10-CM | POA: Diagnosis not present

## 2021-03-05 DIAGNOSIS — K219 Gastro-esophageal reflux disease without esophagitis: Secondary | ICD-10-CM | POA: Diagnosis not present

## 2021-03-05 DIAGNOSIS — E1122 Type 2 diabetes mellitus with diabetic chronic kidney disease: Secondary | ICD-10-CM | POA: Diagnosis not present

## 2021-03-05 DIAGNOSIS — R2689 Other abnormalities of gait and mobility: Secondary | ICD-10-CM | POA: Diagnosis not present

## 2021-03-05 DIAGNOSIS — J449 Chronic obstructive pulmonary disease, unspecified: Secondary | ICD-10-CM | POA: Diagnosis not present

## 2021-03-05 DIAGNOSIS — G4730 Sleep apnea, unspecified: Secondary | ICD-10-CM | POA: Diagnosis not present

## 2021-03-05 DIAGNOSIS — R1312 Dysphagia, oropharyngeal phase: Secondary | ICD-10-CM | POA: Diagnosis not present

## 2021-03-05 DIAGNOSIS — I5021 Acute systolic (congestive) heart failure: Secondary | ICD-10-CM | POA: Diagnosis not present

## 2021-03-05 DIAGNOSIS — F330 Major depressive disorder, recurrent, mild: Secondary | ICD-10-CM | POA: Diagnosis not present

## 2021-03-05 DIAGNOSIS — E871 Hypo-osmolality and hyponatremia: Secondary | ICD-10-CM | POA: Diagnosis not present

## 2021-03-05 DIAGNOSIS — I5043 Acute on chronic combined systolic (congestive) and diastolic (congestive) heart failure: Secondary | ICD-10-CM | POA: Diagnosis not present

## 2021-03-05 DIAGNOSIS — Z7401 Bed confinement status: Secondary | ICD-10-CM | POA: Diagnosis not present

## 2021-03-05 DIAGNOSIS — I4891 Unspecified atrial fibrillation: Secondary | ICD-10-CM | POA: Diagnosis present

## 2021-03-05 DIAGNOSIS — Z794 Long term (current) use of insulin: Secondary | ICD-10-CM | POA: Diagnosis not present

## 2021-03-05 DIAGNOSIS — D509 Iron deficiency anemia, unspecified: Secondary | ICD-10-CM | POA: Diagnosis not present

## 2021-03-05 DIAGNOSIS — R262 Difficulty in walking, not elsewhere classified: Secondary | ICD-10-CM | POA: Diagnosis not present

## 2021-03-05 DIAGNOSIS — I5033 Acute on chronic diastolic (congestive) heart failure: Secondary | ICD-10-CM | POA: Diagnosis not present

## 2021-03-05 DIAGNOSIS — I5042 Chronic combined systolic (congestive) and diastolic (congestive) heart failure: Secondary | ICD-10-CM | POA: Diagnosis not present

## 2021-03-05 DIAGNOSIS — Z79899 Other long term (current) drug therapy: Secondary | ICD-10-CM | POA: Diagnosis not present

## 2021-03-05 DIAGNOSIS — I129 Hypertensive chronic kidney disease with stage 1 through stage 4 chronic kidney disease, or unspecified chronic kidney disease: Secondary | ICD-10-CM | POA: Diagnosis not present

## 2021-03-05 DIAGNOSIS — E8770 Fluid overload, unspecified: Secondary | ICD-10-CM | POA: Diagnosis not present

## 2021-03-05 DIAGNOSIS — E119 Type 2 diabetes mellitus without complications: Secondary | ICD-10-CM | POA: Diagnosis not present

## 2021-03-05 DIAGNOSIS — N2581 Secondary hyperparathyroidism of renal origin: Secondary | ICD-10-CM | POA: Diagnosis not present

## 2021-03-05 DIAGNOSIS — Z09 Encounter for follow-up examination after completed treatment for conditions other than malignant neoplasm: Secondary | ICD-10-CM | POA: Diagnosis not present

## 2021-03-05 DIAGNOSIS — R1310 Dysphagia, unspecified: Secondary | ICD-10-CM | POA: Diagnosis not present

## 2021-03-05 DIAGNOSIS — R578 Other shock: Secondary | ICD-10-CM | POA: Diagnosis not present

## 2021-03-05 DIAGNOSIS — R0602 Shortness of breath: Secondary | ICD-10-CM | POA: Diagnosis present

## 2021-03-05 LAB — RENAL FUNCTION PANEL
Albumin: 2.7 g/dL — ABNORMAL LOW (ref 3.5–5.0)
Anion gap: 13 (ref 5–15)
BUN: 13 mg/dL (ref 8–23)
CO2: 29 mmol/L (ref 22–32)
Calcium: 9.4 mg/dL (ref 8.9–10.3)
Chloride: 98 mmol/L (ref 98–111)
Creatinine, Ser: 4.89 mg/dL — ABNORMAL HIGH (ref 0.44–1.00)
GFR, Estimated: 9 mL/min — ABNORMAL LOW (ref >60)
Glucose, Bld: 104 mg/dL — ABNORMAL HIGH (ref 70–99)
Phosphorus: 2.4 mg/dL — ABNORMAL LOW (ref 2.5–4.6)
Potassium: 3.6 mmol/L (ref 3.5–5.1)
Sodium: 140 mmol/L (ref 135–145)

## 2021-03-05 LAB — CBC WITH DIFFERENTIAL/PLATELET
Abs Immature Granulocytes: 0.04 10*3/uL (ref 0.00–0.07)
Basophils Absolute: 0.1 10*3/uL (ref 0.0–0.1)
Basophils Relative: 1 %
Eosinophils Absolute: 0.2 10*3/uL (ref 0.0–0.5)
Eosinophils Relative: 1 %
HCT: 31.8 % — ABNORMAL LOW (ref 36.0–46.0)
Hemoglobin: 10.5 g/dL — ABNORMAL LOW (ref 12.0–15.0)
Immature Granulocytes: 0 %
Lymphocytes Relative: 18 %
Lymphs Abs: 2 10*3/uL (ref 0.7–4.0)
MCH: 31.6 pg (ref 26.0–34.0)
MCHC: 33 g/dL (ref 30.0–36.0)
MCV: 95.8 fL (ref 80.0–100.0)
Monocytes Absolute: 1 10*3/uL (ref 0.1–1.0)
Monocytes Relative: 9 %
Neutro Abs: 8 10*3/uL — ABNORMAL HIGH (ref 1.7–7.7)
Neutrophils Relative %: 71 %
Platelets: 206 10*3/uL (ref 150–400)
RBC: 3.32 MIL/uL — ABNORMAL LOW (ref 3.87–5.11)
RDW: 15.7 % — ABNORMAL HIGH (ref 11.5–15.5)
WBC: 11.3 10*3/uL — ABNORMAL HIGH (ref 4.0–10.5)
nRBC: 0 % (ref 0.0–0.2)

## 2021-03-05 LAB — GLUCOSE, CAPILLARY
Glucose-Capillary: 136 mg/dL — ABNORMAL HIGH (ref 70–99)
Glucose-Capillary: 202 mg/dL — ABNORMAL HIGH (ref 70–99)

## 2021-03-05 MED ORDER — SENNOSIDES-DOCUSATE SODIUM 8.6-50 MG PO TABS: 1.0000 | ORAL_TABLET | Freq: Every evening | ORAL | Status: DC | PRN

## 2021-03-05 MED ORDER — NEPRO/CARBSTEADY PO LIQD: 237.0000 mL | Freq: Three times a day (TID) | ORAL | 2 refills | Status: AC

## 2021-03-05 MED ORDER — AMIODARONE HCL 200 MG PO TABS: ORAL_TABLET | ORAL | 0 refills | Status: DC

## 2021-03-05 MED ORDER — MIDODRINE HCL 10 MG PO TABS: 10.0000 mg | ORAL_TABLET | Freq: Three times a day (TID) | ORAL | 3 refills | Status: DC

## 2021-03-05 MED ORDER — RENA-VITE PO TABS: 1.0000 | ORAL_TABLET | Freq: Every day | ORAL | 0 refills | Status: DC

## 2021-03-05 MED ORDER — INSULIN ASPART 100 UNIT/ML IJ SOLN: INTRAMUSCULAR | 11 refills | Status: DC

## 2021-03-05 MED ORDER — QUETIAPINE FUMARATE 25 MG PO TABS: 25.0000 mg | ORAL_TABLET | Freq: Every day | ORAL | 0 refills | Status: DC

## 2021-03-05 NOTE — Progress Notes (Addendum)
[facility] Progress Note   Subjective: Seen in room. No C/Os. Stable from cardiology stand point for discharge. Stable for DC from Nephrology stand point.   Objective Vitals:   03/04/21 1943 03/04/21 2100 03/05/21 0335 03/05/21 0747  BP:  138/85 (!) 146/79   Pulse:  60 62   Resp:  17 18   Temp:  98.4 F (36.9 C) 98.8 F (37.1 C)   TempSrc:  Oral Oral   SpO2: 98% 97% 96% 94%  Weight:   85.3 kg   Height:       General: Pleasant, elderly female in bed in NAD   HEENT NCAT Neck: supple trachea midline Heart: S1S2 no M/R/G SB on monitor.  Lungs: Clear to auscultation; unlabored on room air  Abdomen: soft/nt/obese habitus Extremities: No edema lower extremities Psych normal mood and affect Neuro -alert and oriented x 3 provides hx and follows commands Dialysis Access: LUE AVF with bruit and thrill      Additional Objective Labs: Basic Metabolic Panel: Recent Labs  Lab 03/03/21 0048 03/04/21 0153 03/05/21 0255  NA 137 138 140  K 3.5 3.4* 3.6  CL 98 97* 98  CO2 24 25 29   GLUCOSE 211* 177* 104*  BUN 22 28* 13  CREATININE 5.80* 7.59* 4.89*  CALCIUM 8.9 9.4 9.4  PHOS 2.5 4.0 2.4*   Liver Function Tests: Recent Labs  Lab 03/03/21 0048 03/04/21 0153 03/05/21 0255  ALBUMIN 2.6* 2.6* 2.7*   No results for input(s): LIPASE, AMYLASE in the last 168 hours. CBC: Recent Labs  Lab 03/01/21 0318 03/02/21 0321 03/03/21 0048 03/04/21 0153 03/05/21 0255  WBC 11.0* 10.9* 11.1* 11.3* 11.3*  NEUTROABS 7.9* 7.7 7.9* 8.2* 8.0*  HGB 10.9* 10.8* 10.6* 10.0* 10.5*  HCT 34.2* 32.8* 32.0* 31.7* 31.8*  MCV 97.7 95.9 96.4 96.9 95.8  PLT 217 207 195 199 206   Blood Culture    Component Value Date/Time   SDES TRACHEAL ASPIRATE 02/19/2021 1052   SPECREQUEST NONE 02/19/2021 1052   CULT  02/19/2021 1052    MODERATE Normal respiratory flora-no Staph aureus or Pseudomonas seen Performed at [facility] [address], [place], [place] [address]     REPTSTATUS 02/21/2021 FINAL 02/19/2021 1052    Cardiac Enzymes: No results for input(s): CKTOTAL, CKMB, CKMBINDEX, TROPONINI in the last 168 hours. CBG: Recent Labs  Lab 03/04/21 0726 03/04/21 1230 03/04/21 1611 03/04/21 2122 03/05/21 0802  GLUCAP 196* 132* 216* 181* 136*   Iron Studies: No results for input(s): IRON, TIBC, TRANSFERRIN, FERRITIN in the last 72 hours. @lablastinr3 @ Studies/Results: No results found. Medications:  sodium chloride Stopped (02/25/21 1953)   ferric gluconate (FERRLECIT) IVPB Stopped (03/04/21 1142)    amiodarone  200 mg Oral BID   apixaban  5 mg Oral BID   atorvastatin  80 mg Oral QPM   Chlorhexidine Gluconate Cloth  6 each Topical Q0600   doxercalciferol  5 mcg Intravenous Q M,W,F-HD   feeding supplement (NEPRO CARB STEADY)  237 mL Oral TID WC   insulin aspart  0-9 Units Subcutaneous TID AC & HS   insulin detemir  5 Units Subcutaneous Daily   latanoprost  1 drop Both Eyes QHS   lidocaine  1 patch Transdermal Q24H   midodrine  10 mg Oral TID WC   mometasone-formoterol  2 puff Inhalation BID   multivitamin  1 tablet Oral QHS   pantoprazole  20 mg Oral BID   QUEtiapine  25 mg Oral QHS   sodium  chloride flush  3 mL Intravenous Q12H     OP HD:  [facility]  (will be going to MWF schedule at [facility] for SNF)   4h  350/500  87.2kg  2/2 bath  P2  Hep none   - hect 5 ug tiw   - venofer 50 q wk  - no esa    Assessment/Plan: ESRD - CRRT started 12/30 and stopped 1/1.  HD is now per a MWF schedule (per request of her new SNF). Next HD 03/06/2021 hopefully at OP clinic. AMS - delirium. Neuro consulted. resolved Acute resp insufficiency - extubated 12/30. +fever/ ^wbc/ pulm infiltrates. S/p broad spectrum IV abx w/ vanc/ zosyn. Hypotension/Volume - Increased midodrine back to 10 mg TID.  May ultimately need to plan to decrease to midodrine prior to HD. Extra volume removed w/ CRRT and was under EDW after that. Arrived to HD today at OP EDW. Lower EDW on  DC. Optimize volume with HD.  Paroxysmal Afib - cardioverted in ED 12/27.  S/p IV amio as above and on amio gtt during last HD.  Went to DC cardioversion 03/03/2021. Now in SR/SB. Per primary team and cardiology. 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 Not on ESA as OP. Order weekly venofer for today.  MBD ckd - Cont hectorol here. No binders on home med list and phos at goal  Disposition: DC to [facility] on MWF HD schedule. Patient happy to be leaving hospital.   Stephanie Woodward. Lillybeth Tal NP-C 03/05/2021, 10:09 AM  [employer] [phone]

## 2021-03-05 NOTE — Progress Notes (Signed)
Pt to d/c to snf today. Contacted [facility] and spoke to [place]. Clinic aware pt to d/c today and will resume care tomorrow on MWF schedule per snf request. CSW has advised snf of pt's arrival time of 12:10 for 12:25 chair.   Melven Sartorius Renal Navigator [phone]

## 2021-03-05 NOTE — Discharge Summary (Signed)
Physician Discharge Summary  Stephanie Woodward EBR:[id number] DOB: 06-11-1940 DOA: 02/17/2021  PCP: Larey Dresser, MD  Admit date: 02/17/2021 Discharge date: 03/05/2021  Admitted From: Home.  Disposition:  SNF  Recommendations for Outpatient Follow-up:  Follow up with PCP in 1-2 weeks Please obtain BMP/CBC in one week Please follow up With cardiology as scheduled.     Discharge Condition: stable.  CODE STATUS:full code.  Diet recommendation: Heart Healthy / dysphagia 3 diet.   Brief/Interim Summary:   Stephanie Woodward is a 81 yo female with PMH PAF, CHF, ESRD on HD, COPD, IDDM, gout, HLD, HTN, sleep apnea who initially presented to the hospital on 02/17/2021 with shortness of breath and palpitations.  She was found to be in A. fib with RVR.  She had further decreased level of responsiveness and ultimately required intubation for airway protection on 02/19/2021.  She then developed hypotension requiring vasopressor support.  She was able to be extubated on 02/20/2021 and was started on midodrine to help with Levophed weaning.  She was managed by cardiology as well. She underwent LHC on 02/24/21 which showed nonobstructive CAD although an abnormal coronary tree.   Discharge Diagnoses:  Principal Problem:   Paroxysmal atrial fibrillation with RVR (HCC) Active Problems:   COPD (chronic obstructive pulmonary disease) (HCC)   Type 2 diabetes mellitus, controlled, with renal complications (HCC)   Hyperlipidemia   Acute on chronic combined systolic and diastolic CHF (congestive heart failure) (HCC)   ESRD on hemodialysis (HCC)   Malnutrition of moderate degree   Hypotension  Paroxysmal atrial fibrillation with RVR s/p DCCV Patient was on IV amiodarone transition to oral amiodarone and Eliquis. Amiodarone 200 mg bid till 03/13/21 followed by amiodarone 200 mg daily.      Acute on chronic combined systolic and diastolic CHF Last echocardiogram showed left ventricular ejection fraction of 25  to 30% with grade 2 diastolic dysfunction.  Patient underwent left heart catheterization showing nonobstructive CAD but unusual coronary tree with small LAD in the large dominant RCA. Medical management.  Recommend outpatient follow up with cardiology as scheduled.  No room to start the patient on GDMT.      Hypotension:  Resolved.  Resume midodrine 10 mg TID.      ESRD on HD;  Further management as per nephrology.        Acute respiratory failure with hypoxia Intubated on 12/ 29 for airway protection and extubated on 12/30. Possible aspiration event with baseline OHS. Completed the course of antibiotics.  SLP eval recommended dysphagia 3 diet Patient currently on room air without any shortness of breath or cough.       Hyperlipidemia Continue with the Lipitor.     COPD No wheezing heard on exam. RESUME bronchodilators on discharge      Type 2 diabetes mellitus with hyperglycemia Resume home meds on discharge.  Last A1c is 9.1%.             Discharge Instructions  Discharge Instructions     Diet - low sodium heart healthy   Complete by: As directed    Increase activity slowly   Complete by: As directed       Allergies as of 03/05/2021       Reactions   Eggs Or Egg-derived Products Nausea And Vomiting   Lisinopril Nausea And Vomiting   Penicillins Nausea And Vomiting   Has patient had a PCN reaction causing immediate rash, facial/tongue/throat swelling, SOB or lightheadedness with hypotension: No Has patient had a PCN  reaction causing severe rash involving mucus membranes or skin necrosis: No Has patient had a PCN reaction that required hospitalization: No Has patient had a PCN reaction occurring within the last 10 years: No If all of the above answers are "NO", then may proceed with Cephalosporin use.   Other Other (See Comments)        Medication List     STOP taking these medications    hydrALAZINE 100 MG tablet Commonly known as:  APRESOLINE   isosorbide mononitrate 30 MG 24 hr tablet Commonly known as: IMDUR   metoprolol succinate 25 MG 24 hr tablet Commonly known as: TOPROL-XL   Narcan 4 MG/0.1ML Liqd nasal spray kit Generic drug: naloxone   NovoLOG FlexPen 100 UNIT/ML FlexPen Generic drug: insulin aspart Replaced by: insulin aspart 100 UNIT/ML injection   traMADol 50 MG tablet Commonly known as: ULTRAM       TAKE these medications    acetaminophen 500 MG tablet Commonly known as: TYLENOL Take 500 mg by mouth every 6 (six) hours as needed (pain).   albuterol 108 (90 Base) MCG/ACT inhaler Commonly known as: VENTOLIN HFA Inhale 2 puffs into the lungs every 4 (four) hours as needed for shortness of breath.   amiodarone 200 MG tablet Commonly known as: PACERONE Take Amiodarone 200 mg BID till 03/13/2021 followed by  Amiodarone 200 mg daily from 03/14/2021   atorvastatin 80 MG tablet Commonly known as: LIPITOR Take 80 mg by mouth every evening.   budesonide-formoterol 160-4.5 MCG/ACT inhaler Commonly known as: SYMBICORT Inhale 2 puffs into the lungs 2 (two) times daily.   Dialyvite 800-Zinc 15 0.8 MG Tabs Take 1 tablet by mouth in the morning.   Eliquis 5 MG Tabs tablet Generic drug: apixaban Take 1 tablet by mouth twice daily What changed: how much to take   feeding supplement (NEPRO CARB STEADY) Liqd Take 237 mLs by mouth 3 (three) times daily with meals.   FreeStyle [employer] 1 kit by Does not apply route daily. Use as instructed E11.65   FreeStyle Lite Devi Use as instructed to check blood sugar 4 times daily What changed:  how much to take how to take this when to take this   FREESTYLE LITE test strip Generic drug: glucose blood USE AS DIRECTED 4 TIMES DAILY What changed:  how much to take how to take this when to take this   insulin aspart 100 UNIT/ML injection Commonly known as: novoLOG CBG 70 - 120: 0 units CBG 121 - 150: 1 unit CBG 151 - 200: 2 units CBG  201 - 250: 3 units CBG 251 - 300: 5 units CBG 301 - 350: 7 units CBG 351 - 400: 9 units Replaces: NovoLOG FlexPen 100 UNIT/ML FlexPen   Insulin Pen Needle 32G X 4 MM Misc 1 Device by Does not apply route in the morning, at noon, in the evening, and at bedtime.   ketoconazole 2 % cream Commonly known as: NIZORAL Apply 1 application topically every morning.   ketoconazole 2 % shampoo Commonly known as: NIZORAL Apply 1 application topically See admin instructions. 2-3 times weekly   latanoprost 0.005 % ophthalmic solution Commonly known as: XALATAN Place 1 drop into both eyes at bedtime.   lidocaine 3 % Crea cream Commonly known as: LINDAMANTLE Apply 1 application topically as needed (Before dialysis on Tuesdays, Thursday, and Saturday.).   lidocaine 5 % Commonly known as: LIDODERM Place 1 patch onto the skin daily.   midodrine 10 MG tablet Commonly  known as: PROAMATINE Take 1 tablet (10 mg total) by mouth 3 (three) times daily with meals.   mirtazapine 15 MG tablet Commonly known as: REMERON Take 15 mg by mouth at bedtime.   multivitamin Tabs tablet Take 1 tablet by mouth at bedtime.   ondansetron 4 MG tablet Commonly known as: ZOFRAN Take 4 mg by mouth 2 (two) times daily as needed for nausea.   pantoprazole 40 MG tablet Commonly known as: PROTONIX Take 40 mg by mouth every evening.   QUEtiapine 25 MG tablet Commonly known as: SEROQUEL Take 1 tablet (25 mg total) by mouth at bedtime.   senna-docusate 8.6-50 MG tablet Commonly known as: Senokot-S Take 1 tablet by mouth at bedtime as needed for mild constipation.   sucroferric oxyhydroxide 500 MG chewable tablet Commonly known as: VELPHORO Chew 500 mg by mouth See admin instructions. Crush or chew or swallow 1 tablet three times a day with meals and 1 tablet two times a day with snacks.   Tyler Aas FlexTouch 100 UNIT/ML FlexTouch Pen Generic drug: insulin degludec Inject 18 Units into the skin daily.         Contact information for follow-up providers     [place] Follow up on 03/10/2021.   Specialty: Cardiology Why: [facility] at 9:30 am Entrance C Contact information: [address] [phone] [place] [phone]             Contact information for after-discharge care     Destination     [facility] .   Service: Skilled Nursing Contact information: [place] [address] [phone]                    Allergies  Allergen Reactions   Eggs Or Egg-Derived Products Nausea And Vomiting   Lisinopril Nausea And Vomiting   Penicillins Nausea And Vomiting    Has patient had a PCN reaction causing immediate rash, facial/tongue/throat swelling, SOB or lightheadedness with hypotension: No Has patient had a PCN reaction causing severe rash involving mucus membranes or skin necrosis: No Has patient had a PCN reaction that required hospitalization: No Has patient had a PCN reaction occurring within the last 10 years: No If all of the above answers are "NO", then may proceed with Cephalosporin use.    Other Other (See Comments)    Consultations: Cardiology  Nephrology.    Procedures/Studies: CT ANGIO HEAD NECK W WO CM  Result Date: 02/19/2021 CLINICAL DATA:  Neuro deficit, acute, stroke suspected EXAM: CT HEAD WITHOUT CONTRAST CT ANGIOGRAPHY OF THE HEAD AND NECK TECHNIQUE: Contiguous axial images were obtained from the base of the skull through the vertex without intravenous contrast. Multidetector CT imaging of the head and neck was performed using the standard protocol during bolus administration of intravenous contrast. Multiplanar CT image reconstructions and MIPs were obtained to evaluate the vascular anatomy. Carotid stenosis measurements (when applicable) are obtained utilizing NASCET criteria, using the distal internal  carotid diameter as the denominator. CONTRAST:  32m OMNIPAQUE IOHEXOL 350 MG/ML SOLN COMPARISON:  None. FINDINGS: CT HEAD Brain: No evidence of acute large vascular territory infarction, hemorrhage, hydrocephalus, extra-axial collection or mass lesion/mass effect. Moderate patchy white matter hypodensities, nonspecific but compatible with chronic microvascular ischemic disease. Vascular: Detailed below. Skull: No acute fracture. Sinuses/Orbits: Clear sinuses.  Unremarkable orbits. CTA NECK Aortic arch: Great vessel origins are patent without significant stenosis. Right carotid system: No  evidence of significant (greater than 50%) stenosis. Mild atherosclerosis at the carotid bifurcation. Left carotid system: No evidence of significant (greater than 50%) stenosis. Mild atherosclerosis at the carotid bifurcation. Vertebral arteries:Mildly right dominant. Multifocal mild stenosis of the left vertebral artery. No evidence of greater than 50% stenosis. z Skeleton: Multilevel degenerative change in the cervical spine Other neck: Visualized lung apices are clear. Approximately 8 mm right thyroid nodule, which does not require further imaging follow-up per current guidelines (ref: J Am Coll Radiol. 2015 Feb;12(2): 143-50). CTA HEAD Anterior circulation: Calcific atherosclerosis of bilateral intracranial ICAs with resulting severe left and moderate right stenosis. 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 Otherwise, MCAs and ACAs are patent. Posterior circulation: Small/non dominant left vertebral artery. Bilateral intradural vertebral arteries, basilar artery, and posterior cerebral arteries are patent. The PCAs are small and poorly visualized distally, but patent proximally without proximal high-grade stenosis. Venous sinuses: No evidence of dural venous sinus thrombosis. Anatomic variants: Detailed above.  IMPRESSION: CT head: 1. No evidence of acute intracranial abnormality. 2. Moderate chronic microvascular ischemic disease. CTA: 1. No large vessel occlusion. 2. 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 Preliminary findings discussed with Dr. Theda Sers via telephone at 9:43 a.m. Electronically Signed   By: Margaretha Sheffield M.D.   On: 02/19/2021 10:00   CT HEAD WO CONTRAST (5MM)  Result Date: 02/19/2021 CLINICAL DATA:  Neuro deficit, acute, stroke suspected EXAM: CT HEAD WITHOUT CONTRAST CT ANGIOGRAPHY OF THE HEAD AND NECK TECHNIQUE: Contiguous axial images were obtained from the base of the skull through the vertex without intravenous contrast. Multidetector CT imaging of the head and neck was performed using the standard protocol during bolus administration of intravenous contrast. Multiplanar CT image reconstructions and MIPs were obtained to evaluate the vascular anatomy. Carotid stenosis measurements (when applicable) are obtained utilizing NASCET criteria, using the distal internal carotid diameter as the denominator. CONTRAST:  49m OMNIPAQUE IOHEXOL 350 MG/ML SOLN COMPARISON:  None. FINDINGS: CT HEAD Brain: No evidence of acute large vascular territory infarction, hemorrhage, hydrocephalus, extra-axial collection or mass lesion/mass effect. Moderate patchy white matter hypodensities, nonspecific but compatible with chronic microvascular ischemic disease. Vascular: Detailed below. Skull: No acute fracture. Sinuses/Orbits: Clear sinuses.  Unremarkable orbits. CTA NECK Aortic arch: Great vessel origins are patent without significant stenosis. Right carotid system: No evidence of significant (greater than 50%) stenosis. Mild atherosclerosis at the carotid bifurcation. Left carotid system: No evidence of significant (greater than 50%) stenosis. Mild atherosclerosis at the carotid bifurcation. Vertebral arteries:Mildly right dominant. Multifocal mild stenosis of the left vertebral artery. No  evidence of greater than 50% stenosis. z Skeleton: Multilevel degenerative change in the cervical spine Other neck: Visualized lung apices are clear. Approximately 8 mm right thyroid nodule, which does not require further imaging follow-up per current guidelines (ref: J Am Coll Radiol. 2015 Feb;12(2): 143-50). CTA HEAD Anterior circulation: Calcific atherosclerosis of bilateral intracranial ICAs with resulting severe left and moderate right stenosis. 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 Otherwise, MCAs and ACAs are patent. Posterior circulation: Small/non dominant left vertebral artery. Bilateral intradural vertebral arteries, basilar artery, and posterior cerebral arteries are patent. The PCAs are small and poorly visualized distally, but patent proximally without proximal high-grade stenosis. Venous sinuses: No evidence of dural venous sinus thrombosis. Anatomic variants:  Detailed above. IMPRESSION: CT head: 1. No evidence of acute intracranial abnormality. 2. Moderate chronic microvascular ischemic disease. CTA: 1. No large vessel occlusion. 2. 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 Preliminary findings discussed with Dr. Theda Sers via telephone at 9:43 a.m. Electronically Signed   By: Margaretha Sheffield M.D.   On: 02/19/2021 10:00   MR BRAIN WO CONTRAST  Result Date: 02/19/2021 CLINICAL DATA:  Encephalopathy EXAM: MRI HEAD WITHOUT CONTRAST TECHNIQUE: Multiplanar, multiecho pulse sequences of the brain and surrounding structures were obtained without intravenous contrast. COMPARISON:  None. FINDINGS: Brain: No acute infarct, mass effect or extra-axial collection. No acute or chronic hemorrhage. Hyperintense T2-weighted signal is moderately widespread throughout the white matter. Generalized volume loss without a clear lobar predilection. The midline structures are normal.  Vascular: Major flow voids are preserved. Skull and upper cervical spine: Normal calvarium and skull base. Visualized upper cervical spine and soft tissues are normal. Sinuses/Orbits:No paranasal sinus fluid levels or advanced mucosal thickening. No mastoid or middle ear effusion. Normal orbits. The patient is intubated and there is fluid filling the nasopharynx. IMPRESSION: 1. No acute intracranial abnormality. 2. Generalized volume loss and findings of chronic small vessel ischemia. 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 Electronically Signed   By: Ulyses Jarred M.D.   On: 02/19/2021 22:22   CARDIAC CATHETERIZATION  Result Date: 02/24/2021 Unusual coronary tree with what appears to be a congenitally small LAD and large, super-dominant RCA.  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 Will consider future coronary CTA to make sure we are not missing a coronary anomaly.   DG CHEST PORT 1 VIEW  Result Date: 02/22/2021 CLINICAL DATA:  Atrial fibrillation. EXAM: PORTABLE CHEST 1 VIEW COMPARISON:  02/21/2021 and older exams. FINDINGS: Mild cardiomegaly. Bilateral interstitial thickening most evident in the lung bases, without significant change from the previous day's exam. No new lung abnormalities. Right internal jugular central venous line, tip in the mid superior vena cava, also stable. No pneumothorax.  Small effusion suspected. IMPRESSION: 1. Mild cardiomegaly with bilateral interstitial thickening suggests mild congestive heart failure. No significant change from the previous day's study. Electronically Signed   By: Lajean Manes M.D.   On: 02/22/2021 09:18   DG CHEST PORT 1 VIEW  Result Date: 02/21/2021 CLINICAL DATA:  Atrial fibrillation. EXAM: PORTABLE CHEST 1 VIEW COMPARISON:  February 19, 2021. FINDINGS: Stable cardiomegaly. Endotracheal and nasogastric tubes have been removed. Right internal jugular catheter is unchanged. Mild bibasilar interstitial densities are noted  concerning for edema or possibly atelectasis. Minimal pleural effusions may be present. Bony thorax is unremarkable. IMPRESSION: Stable cardiomegaly. Mild bibasilar interstitial densities are noted concerning for edema or atelectasis. Minimal pleural effusions are noted. Electronically Signed   By: Marijo Conception M.D.   On: 02/21/2021 14:58   DG CHEST PORT 1 VIEW  Result Date: 02/19/2021 CLINICAL DATA:  Central line placement. EXAM: PORTABLE CHEST 1 VIEW COMPARISON:  Radiographs 02/19/2021 and 02/17/2021.  CT 11/26/2017. FINDINGS: 1705 hours. The endotracheal tube and enteric tube appear unchanged, although the tip of the latter is not visualized. There is new right IJ central venous catheter projecting to the level of the superior cavoatrial junction. The heart remains enlarged. There is persistent vascular congestion with probable mild pulmonary edema. There is increased left lower lobe airspace disease and a probable small left pleural effusion. No evidence of pneumothorax. Degenerative changes are present in the spine and shoulders. Telemetry leads overlie the chest. IMPRESSION: 1. New right IJ central venous catheter projects  to the level of the superior cavoatrial junction. No evidence of pneumothorax. 2. Persistent pulmonary edema with increasing left lower lobe airspace disease suspicious for pneumonia, possibly on the basis of aspiration. Electronically Signed   By: Richardean Sale M.D.   On: 02/19/2021 17:22   DG CHEST PORT 1 VIEW  Result Date: 02/19/2021 CLINICAL DATA:  Hypoxia EXAM: PORTABLE CHEST 1 VIEW COMPARISON:  Chest x-ray 02/17/2021 FINDINGS: Endotracheal tube tip is 4 cm above the carina. Enteric tube tip is below the diaphragm. Heart is enlarged. Mediastinum appears grossly stable. Pulmonary vascular congestion with hazy perihilar and lower lung zone opacities, increased since previous study. No large pleural effusion identified. Trace left effusion not excluded. No pneumothorax.  IMPRESSION: 1. Medical devices as described. 2. Cardiomegaly, pulmonary vascular congestion and increased hazy opacities since previous study which may represent increasing edema/infiltrate. Electronically Signed   By: Ofilia Neas M.D.   On: 02/19/2021 09:07   DG Chest Portable 1 View  Result Date: 02/17/2021 CLINICAL DATA:  Shortness of breath, AFib EXAM: PORTABLE CHEST 1 VIEW COMPARISON:  Chest radiograph 06/08/2020 FINDINGS: The heart is enlarged, unchanged. The upper mediastinal contours are within normal limits. There is vascular congestion and probable mild pulmonary interstitial edema. There are trace bilateral pleural effusions. There is no pneumothorax There is no acute osseous abnormality. IMPRESSION: Cardiomegaly with mild pulmonary interstitial edema and trace bilateral pleural effusions suggesting CHF. Electronically Signed   By: Valetta Mole M.D.   On: 02/17/2021 15:05   EEG adult  Result Date: 02/19/2021 Lora Havens, MD     02/19/2021 12:11 PM Patient Name: Stephanie Woodward MRN: [id number] Epilepsy Attending: Lora Havens Referring Physician/Provider: Jennelle Human, NP Date: 02/19/2021 Duration: 23.14 mins Patient history: 81 year old female with altered mental status.  EEG to evaluate for seizure. Level of alertness: lethargic AEDs during EEG study: Propofol Technical aspects: This EEG study was done with scalp electrodes positioned according to the 10-20 International system of electrode placement. Electrical activity was acquired at a sampling rate of '500Hz'  and reviewed with a high frequency filter of '70Hz'  and a low frequency filter of '1Hz' . EEG data were recorded continuously and digitally stored. Description: EEG showed continuous generalized 3 to 6 Hz theta-delta slowing.  There is also an excessive amount of 15 to 18 Hz sharply contoured beta activity with irregular morphology distributed symmetrically and diffusely. Hyperventilation and photic stimulation were not  performed.   ABNORMALITY - Continuous slow, generalized - Excessive beta, generalized IMPRESSION: This study is suggestive of moderate to severe diffuse encephalopathy, nonspecific etiology but could be secondary to sedation. No seizures or definite epileptiform discharges were seen throughout the recording. Lora Havens   ECHOCARDIOGRAM COMPLETE  Result Date: 02/20/2021    ECHOCARDIOGRAM REPORT   Patient Name:   Stephanie Woodward Date of Exam: 02/19/2021 Medical Rec #:  [id number]      Height:       63.0 in Accession #:    [id number]     Weight:       197.5 lb Date of Birth:  1940/04/17     BSA:          1.923 m Patient Age:    82 years       BP:           136/51 mmHg Patient Gender: F              HR:           47 bpm. Exam Location:  Inpatient Procedure: 2D Echo, Cardiac Doppler and Color Doppler Indications:    CHF-Acute Diastolic  History:        Patient has prior history of Echocardiogram examinations, most                 recent 03/05/2019. Pulmonary HTN and COPD, Arrythmias:Atrial                 Fibrillation, Signs/Symptoms:Chest Pain; Risk                 Factors:Hypertension and Diabetes. CKD.  Sonographer:    Clayton Lefort RDCS (AE) Referring Phys: [place]  Sonographer Comments: Patient is morbidly obese and echo performed with patient supine and on artificial respirator. Image acquisition challenging due to patient body habitus. IMPRESSIONS  1. Left ventricular ejection fraction, by estimation, is 25 to 30%. The left ventricle has severely decreased function. The left ventricle demonstrates regional wall motion abnormalities (see scoring diagram/findings for description). There is mild left  ventricular hypertrophy. Left ventricular diastolic parameters are consistent with Grade II diastolic dysfunction (pseudonormalization).  2. Right ventricular systolic function is mildly reduced. The right ventricular size is normal. There is normal pulmonary artery systolic pressure.  3. Left  atrial size was mildly dilated.  4. Right atrial size was mildly dilated.  5. 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 Trivial mitral valve regurgitation. No evidence of mitral stenosis.  6. The aortic valve was not well visualized. Aortic valve regurgitation is not visualized. No aortic stenosis is present.  7. The inferior vena cava is dilated in size with >50% respiratory variability, suggesting right atrial pressure of 8 mmHg. Comparison(s): Compared with echo 74/2595, systolic dysfunction is new. FINDINGS  Left Ventricle: Left ventricular ejection fraction, by estimation, is 25 to 30%. The left ventricle has severely decreased function. The left ventricle demonstrates regional wall motion abnormalities. The left ventricular internal cavity size was normal  in size. There is mild left ventricular hypertrophy. Left ventricular diastolic parameters are consistent with Grade II diastolic dysfunction (pseudonormalization).  LV Wall Scoring: The inferior septum and inferior wall are akinetic. The entire anterior wall, antero-lateral wall, anterior septum, apical lateral segment, apical inferior segment, and apex are hypokinetic. The posterior wall is normal. Right Ventricle: The right ventricular size is normal. No increase in right ventricular wall thickness. Right ventricular systolic function is mildly reduced. There is normal pulmonary artery systolic pressure. The tricuspid regurgitant velocity is 1.82 m/s, and with an assumed right atrial pressure of 8 mmHg, the estimated right ventricular systolic pressure is 63.8 mmHg. Left Atrium: Left atrial size was mildly dilated. Right Atrium: Right atrial size was mildly dilated. Pericardium: There is no evidence of pericardial effusion. 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 There is mild thickening of the mitral valve leaflet(s). There is mild calcification of the mitral valve leaflet(s). Trivial mitral valve regurgitation. No evidence of mitral valve stenosis. MV peak gradient, 5.2 mmHg. The mean mitral valve gradient is 1.0 mmHg. Tricuspid Valve: The tricuspid valve is normal in structure. Tricuspid valve regurgitation is trivial. No evidence of tricuspid stenosis. Aortic Valve: The aortic valve was not well visualized. Aortic valve regurgitation is not visualized. No aortic stenosis is present. Aortic valve mean gradient measures 2.0 mmHg. Aortic  valve peak gradient measures 4.8 mmHg. Aortic valve area, by VTI measures 2.32 cm. Pulmonic Valve: The pulmonic valve was normal in structure. Pulmonic valve regurgitation is not visualized. No evidence of pulmonic stenosis. Aorta: The aortic root is normal in size and structure. Venous: The inferior vena cava is dilated in size with greater than 50% respiratory variability, suggesting right atrial pressure of 8 mmHg. IAS/Shunts: No atrial level shunt detected by color flow Doppler.  LEFT VENTRICLE PLAX 2D LVIDd:         4.70 cm   Diastology LV PW:         1.50 cm   LV e' medial:    4.46 cm/s LV IVS:        1.20 cm   LV E/e' medial:  26.0 LVOT diam:     1.90 cm   LV e' lateral:   5.77 cm/s LV SV:         63        LV E/e' lateral: 20.1 LV SV Index:   33 LVOT Area:     2.84 cm  RIGHT VENTRICLE RV Basal diam:  3.40 cm RV S prime:     8.78 cm/s TAPSE (M-mode): 1.5 cm LEFT ATRIUM             Index        RIGHT ATRIUM           Index LA Vol (A2C):   60.7 ml 31.56 ml/m  RA Area:     20.50 cm LA Vol (A4C):   45.8 ml 23.81 ml/m  RA Volume:   61.80 ml  32.13 ml/m LA Biplane Vol: 56.5 ml 29.37 ml/m  AORTIC VALVE AV Area (Vmax):    2.32 cm AV Area (Vmean):   2.26 cm AV Area (VTI):     2.32 cm AV Vmax:           109.00 cm/s AV Vmean:           67.800 cm/s AV VTI:            0.273 m AV Peak Grad:      4.8 mmHg AV Mean Grad:      2.0 mmHg LVOT Vmax:         89.10 cm/s LVOT Vmean:        54.000 cm/s LVOT VTI:          0.223 m LVOT/AV VTI ratio: 0.82  AORTA Ao Root diam: 3.10 cm Ao Asc diam:  2.70 cm MITRAL VALVE                TRICUSPID VALVE MV Area (PHT): 3.85 cm     TR Peak grad:   13.2 mmHg MV Area VTI:   1.96 cm     TR Vmax:        182.00 cm/s MV Peak grad:  5.2 mmHg MV Mean grad:  1.0 mmHg     SHUNTS MV Vmax:       1.14 m/s     Systemic VTI:  0.22 m MV Vmean:      54.5 cm/s    Systemic Diam: 1.90 cm MV Decel Time: 197 msec MV E velocity: 116.00 cm/s MV A velocity: 53.50 cm/s MV E/A ratio:  2.17 Skeet Latch MD Electronically signed by Skeet Latch MD Signature Date/Time: 02/20/2021/11:06:44 AM    Final       Subjective: No new complaints.   Discharge Exam: Vitals:   03/05/21 0335 03/05/21 2202  BP: (!) 146/79   Pulse: 62   Resp: 18   Temp: 98.8 F (37.1 C)   SpO2: 96% 94%   Vitals:   03/04/21 1943 03/04/21 2100 03/05/21 0335 03/05/21 0747  BP:  138/85 (!) 146/79   Pulse:  60 62   Resp:  17 18   Temp:  98.4 F (36.9 C) 98.8 F (37.1 C)   TempSrc:  Oral Oral   SpO2: 98% 97% 96% 94%  Weight:   85.3 kg   Height:        General: Pt is alert, awake, not in acute distress Cardiovascular: RRR, S1/S2 +, no rubs, no gallops Respiratory: CTA bilaterally, no wheezing, no rhonchi Abdominal: Soft, NT, ND, bowel sounds + Extremities: no edema, no cyanosis    The results of significant diagnostics from this hospitalization (including imaging, microbiology, ancillary and laboratory) are listed below for reference.     Microbiology: Recent Results (from the past 240 hour(s))  Resp Panel by RT-PCR (Flu A&B, Covid) Nasopharyngeal Swab     Status: None   Collection Time: 03/04/21  1:43 PM   Specimen: Nasopharyngeal Swab; Nasopharyngeal(NP) swabs in vial transport medium  Result Value Ref Range Status   SARS  Coronavirus 2 by RT PCR NEGATIVE NEGATIVE Final    Comment: (NOTE) SARS-CoV-2 target nucleic acids are NOT DETECTED.  The SARS-CoV-2 RNA is generally detectable in upper respiratory specimens during the acute phase of infection. The lowest concentration of SARS-CoV-2 viral copies this assay can detect is 138 copies/mL. A negative result does not preclude SARS-Cov-2 infection and should not be used as the sole basis for treatment or other patient management decisions. A negative result may occur with  improper specimen collection/handling, submission of specimen other than nasopharyngeal swab, presence of viral mutation(s) within the areas targeted by this assay, and inadequate number of viral copies(<138 copies/mL). A negative result must be combined with clinical observations, patient history, and epidemiological information. The expected result is Negative.  Fact Sheet for Patients:  EntrepreneurPulse.com.au  Fact Sheet for Healthcare Providers:  IncredibleEmployment.be  This test is no t yet approved or cleared by the [place] FDA and  has been authorized for detection and/or diagnosis of SARS-CoV-2 by FDA under an Emergency Use Authorization (EUA). This EUA will remain  in effect (meaning this test can be used) for the duration of the COVID-19 declaration under Section 564(b)(1) of the Act, 21 U.S.C.section 360bbb-3(b)(1), unless the authorization is terminated  or revoked sooner.       Influenza A by PCR NEGATIVE NEGATIVE Final   Influenza B by PCR NEGATIVE NEGATIVE Final    Comment: (NOTE) The Xpert Xpress SARS-CoV-2/FLU/RSV plus assay is intended as an aid in the diagnosis of influenza from Nasopharyngeal swab specimens and should not be used as a sole basis for treatment. Nasal washings and aspirates are unacceptable for Xpert Xpress SARS-CoV-2/FLU/RSV testing.  Fact Sheet for  Patients: EntrepreneurPulse.com.au  Fact Sheet for Healthcare Providers: IncredibleEmployment.be  This test is not yet approved or cleared by the [place] FDA and has been authorized for detection and/or diagnosis of SARS-CoV-2 by FDA under an Emergency Use Authorization (EUA). This EUA will remain in effect (meaning this test can be used) for the duration of the COVID-19 declaration under Section 564(b)(1) of the Act, 21 U.S.C. section 360bbb-3(b)(1), unless the authorization is terminated or revoked.  Performed at [facility], [place] [address], [place], [place] [address]      Labs: BNP (last 3  results) Recent Labs    06/08/20 1011  BNP 233.0*   Basic Metabolic Panel: Recent Labs  Lab 02/27/21 0159 02/28/21 0255 03/01/21 0318 03/02/21 0321 03/03/21 0048 03/04/21 0153 03/05/21 0255  NA 136 135 134* 135 137 138 140  K 3.4* 3.5 3.3* 3.5 3.5 3.4* 3.6  CL 100 98 94* 94* 98 97* 98  CO2 '25 28 25 25 24 25 29  ' GLUCOSE 206* 300* 155* 134* 211* 177* 104*  BUN 25* 18 25* 31* 22 28* 13  CREATININE 6.48* 4.47* 6.32* 7.84* 5.80* 7.59* 4.89*  CALCIUM 9.3 9.1 9.4 9.3 8.9 9.4 9.4  MG 2.4 2.2 2.2 2.1 2.1  --   --   PHOS  --   --  4.2 4.8* 2.5 4.0 2.4*   Liver Function Tests: Recent Labs  Lab 03/01/21 0318 03/02/21 0321 03/03/21 0048 03/04/21 0153 03/05/21 0255  ALBUMIN 2.6* 2.5* 2.6* 2.6* 2.7*   No results for input(s): LIPASE, AMYLASE in the last 168 hours. No results for input(s): AMMONIA in the last 168 hours. CBC: Recent Labs  Lab 03/01/21 0318 03/02/21 0321 03/03/21 0048 03/04/21 0153 03/05/21 0255  WBC 11.0* 10.9* 11.1* 11.3* 11.3*  NEUTROABS 7.9* 7.7 7.9* 8.2* 8.0*  HGB 10.9* 10.8* 10.6* 10.0* 10.5*  HCT 34.2* 32.8* 32.0* 31.7* 31.8*  MCV 97.7 95.9 96.4 96.9 95.8  PLT 217 207 195 199 206   Cardiac Enzymes: No results for input(s): CKTOTAL, CKMB, CKMBINDEX, TROPONINI in the last 168 hours. BNP: Invalid  input(s): POCBNP CBG: Recent Labs  Lab 03/04/21 0726 03/04/21 1230 03/04/21 1611 03/04/21 2122 03/05/21 0802  GLUCAP 196* 132* 216* 181* 136*   D-Dimer No results for input(s): DDIMER in the last 72 hours. Hgb A1c No results for input(s): HGBA1C in the last 72 hours. Lipid Profile No results for input(s): CHOL, HDL, LDLCALC, TRIG, CHOLHDL, LDLDIRECT in the last 72 hours. Thyroid function studies No results for input(s): TSH, T4TOTAL, T3FREE, THYROIDAB in the last 72 hours.  Invalid input(s): FREET3 Anemia work up No results for input(s): VITAMINB12, FOLATE, FERRITIN, TIBC, IRON, RETICCTPCT in the last 72 hours. Urinalysis    Component Value Date/Time   COLORURINE YELLOW 01/24/2019 1652   APPEARANCEUR HAZY (A) 01/24/2019 1652   LABSPEC 1.011 01/24/2019 1652   PHURINE 6.0 01/24/2019 1652   GLUCOSEU NEGATIVE 01/24/2019 1652   HGBUR NEGATIVE 01/24/2019 1652   BILIRUBINUR NEGATIVE 01/24/2019 1652   KETONESUR NEGATIVE 01/24/2019 1652   PROTEINUR 100 (A) 01/24/2019 1652   UROBILINOGEN 0.2 01/23/2019 1718   NITRITE NEGATIVE 01/24/2019 1652   LEUKOCYTESUR NEGATIVE 01/24/2019 1652   Sepsis Labs Invalid input(s): PROCALCITONIN,  WBC,  LACTICIDVEN Microbiology Recent Results (from the past 240 hour(s))  Resp Panel by RT-PCR (Flu A&B, Covid) Nasopharyngeal Swab     Status: None   Collection Time: 03/04/21  1:43 PM   Specimen: Nasopharyngeal Swab; Nasopharyngeal(NP) swabs in vial transport medium  Result Value Ref Range Status   SARS Coronavirus 2 by RT PCR NEGATIVE NEGATIVE Final    Comment: (NOTE) SARS-CoV-2 target nucleic acids are NOT DETECTED.  The SARS-CoV-2 RNA is generally detectable in upper respiratory specimens during the acute phase of infection. The lowest concentration of SARS-CoV-2 viral copies this assay can detect is 138 copies/mL. A negative result does not preclude SARS-Cov-2 infection and should not be used as the sole basis for treatment or other patient  management decisions. A negative result may occur with  improper specimen collection/handling, submission of specimen other than nasopharyngeal swab, presence  of viral mutation(s) within the areas targeted by this assay, and inadequate number of viral copies(<138 copies/mL). A negative result must be combined with clinical observations, patient history, and epidemiological information. The expected result is Negative.  Fact Sheet for Patients:  EntrepreneurPulse.com.au  Fact Sheet for Healthcare Providers:  IncredibleEmployment.be  This test is no t yet approved or cleared by the [place] FDA and  has been authorized for detection and/or diagnosis of SARS-CoV-2 by FDA under an Emergency Use Authorization (EUA). This EUA will remain  in effect (meaning this test can be used) for the duration of the COVID-19 declaration under Section 564(b)(1) of the Act, 21 U.S.C.section 360bbb-3(b)(1), unless the authorization is terminated  or revoked sooner.       Influenza A by PCR NEGATIVE NEGATIVE Final   Influenza B by PCR NEGATIVE NEGATIVE Final    Comment: (NOTE) The Xpert Xpress SARS-CoV-2/FLU/RSV plus assay is intended as an aid in the diagnosis of influenza from Nasopharyngeal swab specimens and should not be used as a sole basis for treatment. Nasal washings and aspirates are unacceptable for Xpert Xpress SARS-CoV-2/FLU/RSV testing.  Fact Sheet for Patients: EntrepreneurPulse.com.au  Fact Sheet for Healthcare Providers: IncredibleEmployment.be  This test is not yet approved or cleared by the [place] FDA and has been authorized for detection and/or diagnosis of SARS-CoV-2 by FDA under an Emergency Use Authorization (EUA). This EUA will remain in effect (meaning this test can be used) for the duration of the COVID-19 declaration under Section 564(b)(1) of the Act, 21 U.S.C. section 360bbb-3(b)(1),  unless the authorization is terminated or revoked.  Performed at [facility], [place] [address] [place], [place] [address]      Time coordinating discharge: 42 minutes.  SIGNED:   Hosie Poisson, MD  [facility]

## 2021-03-05 NOTE — TOC Transition Note (Addendum)
Transition of Care [facility]) - CM/SW Discharge Note   Patient Details  Name: Stephanie Woodward MRN: [id number] Date of Birth: 03-Feb-1941  [facility] of [facility]) CM/SW Contact:  Brighton Delio, LCSW Phone Number: 03/05/2021, 11:06 AM   Clinical Narrative:    Patient will DC to: [place] Anticipated DC date: 03/05/21 Family notified: Yes, dtr, [name] Transport by: Corey Harold   Per MD patient ready for DC to [facility] [facility], [place]. RN to call report prior to discharge [id number] Room 808P). RN, patient, patient's family, and facility notified of DC. Discharge Summary and FL2 sent to facility. DC packet on chart. Ambulance transport requested for patient and will be a few hours before transport to arrive. [facility] arrived earlier than expected. HCPOA paperwork provided to the patient's daughter, [name]. Chaplain didn't have time to see and notarize before [facility] arrived for transport.   CSW will sign off for now as social work intervention is no longer needed. Please consult [place] again if new needs arise.     Final next level of care: Skilled Nursing Facility Barriers to Discharge: No Barriers Identified   Patient Goals and CMS Choice Patient states their goals for this hospitalization and ongoing recovery are:: recover to previous baseline CMS Medicare.gov Compare Post Acute Care list provided to:: Patient Choice offered to / list presented to : Patient  Discharge Placement              Patient chooses bed at: [facility] Patient to be transferred to facility by: [facility] Name of family member notified: Yes, Dtr, [name] Patient and family notified of of transfer: 03/05/21  Discharge Plan and Services In-house Referral: Clinical Social Work Discharge Planning Services: [employer] Consult Post Acute Care Choice: Skilled Nursing Facility                               Social Determinants of Health (SDOH) Interventions Food Insecurity Interventions: Intervention Not Indicated Financial  Strain Interventions: Intervention Not Indicated, Other (Comment) (Pt. denied any concerns about finances) Housing Interventions: Intervention Not Indicated (Pt. lives alone in an apartment with support from her daughters as needed) Transportation Interventions: Intervention Not Indicated, Other (Comment) (Pt. reports she is driving and doesn't have trouble getting to appointments)   Readmission Risk Interventions No flowsheet data found.   Lash Matulich, MSW, [place] [facility] Social Worker

## 2021-03-05 NOTE — Progress Notes (Addendum)
Patient ID: Stephanie Woodward, female   DOB: Mar 08, 1940, 81 y.o.   MRN: [id number]     Advanced Heart Failure Rounding Note  PCP-Cardiologist: Ezzard Standing, MD   Subjective:    12/29: Found unresponsive, no arrhythmias noted. Intubated. Started on NE.  MRI brain without acute process. 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  Had fever to 101F. Started on broad spectrum abx and pan cultures sent.  12/30: CVVH started. Extubated.  Echo reviewed: EF 30-35% range, worse function in septum; mild RV dysfunction, IVC dilated.  01/01: CRRT stopped 01/03: LHC showed no obstructive CAD. 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  1/5: Midodrine cut back to 5 mg tid. 1/6: Back in A fib RVR. IV amio restarted. Midodrine increased to 10 mg tid.  1/10: DCCV>>NSR   BP soft yesterday (dialysis). Stable/slightly elevated today on midodrine.  Rhythm appears to be sinus with PACs. P waves appear flat at times.   No complaints. Denies dyspnea or CP.  Objective:   Weight Range: 85.3 kg Body mass index is 34.39 kg/m.   Vital Signs:   Temp:  [97.5 F (36.4 C)-98.8 F (37.1 C)] 98.8 F (37.1 C) (01/12 0335) Pulse Rate:  [55-70] 62 (01/12 0335) Resp:  [12-24] 18 (01/12 0335) BP: (104-146)/(45-85) 146/79 (01/12 0335) SpO2:  [95 %-98 %] 96 % (01/12 0335) Weight:  [85.3 kg-87.2 kg] 85.3 kg (01/12 0335) Last BM Date: 03/03/21  Weight change: Filed Weights   03/04/21 0744 03/04/21 1150 03/05/21 0335  Weight: 87.2 kg 85.5 kg 85.3 kg    Intake/Output:   Intake/Output Summary (Last 24 hours) at 03/05/2021 0726 Last data filed at 03/05/2021 0600 Gross per 24 hour  Intake 459.96 ml  Output 1500 ml  Net -1040.04 ml      Physical Exam   General:  Sitting up in chair. No distress. HEENT:  normal Neck: supple. no JVD. Carotids 2+ bilat; no bruits. No lymphadenopathy or thyromegaly appreciated. Cor: PMI nondisplaced. Regular rate & rhythm. No rubs, gallops or murmurs. Lungs: clear Abdomen: soft, nontender, nondistended. No hepatosplenomegaly. No bruits or masses. Good bowel sounds. Extremities: no cyanosis, clubbing, rash, trace edema Neuro: alert & oriented x 3, cranial nerves grossly intact. moves all 4 extremities w/o difficulty. Affect pleasant.   Telemetry   Likely SR 60s with PACs   Labs    CBC Recent Labs    03/04/21 0153 03/05/21 0255  WBC 11.3* 11.3*  NEUTROABS 8.2* 8.0*  HGB 10.0* 10.5*  HCT 31.7* 31.8*  MCV 96.9 95.8  PLT 199 938   Basic Metabolic Panel Recent Labs    03/03/21 0048 03/04/21 0153 03/05/21 0255  NA 137 138 140  K 3.5 3.4* 3.6  CL 98 97* 98  CO2 24 25 29   GLUCOSE 211* 177* 104*  BUN 22 28* 13  CREATININE 5.80* 7.59* 4.89*  CALCIUM 8.9 9.4 9.4  MG 2.1  --   --   PHOS 2.5 4.0 2.4*   Liver Function Tests Recent Labs    03/04/21 0153 03/05/21 0255  ALBUMIN 2.6* 2.7*    No results for input(s): LIPASE, AMYLASE in the last 72 hours. Cardiac Enzymes No results for input(s): CKTOTAL, CKMB, CKMBINDEX, TROPONINI in the last 72 hours.  BNP: BNP (last 3 results) Recent Labs    06/08/20 1011  BNP 402.0*    ProBNP (  last 3 results) No results for input(s): PROBNP in the last 8760 hours.   D-Dimer No results for input(s): DDIMER in the last 72 hours. Hemoglobin A1C No results for input(s): HGBA1C in the last 72 hours.  Fasting Lipid Panel No results for input(s): CHOL, HDL, LDLCALC, TRIG, CHOLHDL, LDLDIRECT in the last 72 hours.  Thyroid Function Tests No results for input(s): TSH, T4TOTAL, T3FREE, THYROIDAB in the last 72 hours.  Invalid input(s): FREET3   Other results:   Imaging    No results found.   Medications:     Scheduled Medications:  amiodarone  200 mg Oral BID   apixaban  5 mg Oral BID    atorvastatin  80 mg Oral QPM   Chlorhexidine Gluconate Cloth  6 each Topical Q0600   doxercalciferol  5 mcg Intravenous Q M,W,F-HD   feeding supplement (NEPRO CARB STEADY)  237 mL Oral TID WC   insulin aspart  0-9 Units Subcutaneous TID AC & HS   insulin detemir  5 Units Subcutaneous Daily   latanoprost  1 drop Both Eyes QHS   lidocaine  1 patch Transdermal Q24H   midodrine  10 mg Oral TID WC   mometasone-formoterol  2 puff Inhalation BID   multivitamin  1 tablet Oral QHS   pantoprazole  20 mg Oral BID   QUEtiapine  25 mg Oral QHS   sodium chloride flush  3 mL Intravenous Q12H    Infusions:  sodium chloride Stopped (02/25/21 1953)   ferric gluconate (FERRLECIT) IVPB Stopped (03/04/21 1142)    PRN Medications: sodium chloride, acetaminophen, guaiFENesin-dextromethorphan, hydrALAZINE, ipratropium-albuterol, ondansetron (ZOFRAN) IV, ondansetron **OR** [DISCONTINUED] ondansetron (ZOFRAN) IV, senna-docusate, traMADol   Assessment/Plan   1.  Atrial fibrillation: Known hx paroxysmal AF.  Se[facility] ED [facility] the week PTA for AF and spontaneously converted to SR.  Presented 02/17/21 with AF with RVR. Cardioverted in ER.  She was in junctional rhythm after respiratory arrest and intubation with rate upper 40s-50s. Rhythm on 1/3 appeared to be wandering atrial pacemaker, and amiodarone stopped.  Reverted back to AF with RVR and amiodarone restarted. S/p DCCV 1/10 back to NSR.  - Looks like SR with PACs this am. Check ECG to confirm - Continue amiodarone 200 mg bid X 10 days then 200 mg daily - Continue Eliquis 5 mg bid  2. Acute systolic CHF: Echo in 9/83 with EF 50-55%, mild LVH, grade 2 diastolic dysfunction.  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  [place] 11/23/17 showed significantly elevated right and left heart filling pressures and severe primarily pulmonary venous hypertension. Cardiac output preserved. Echo  (10/19) with EF 45-50%, septal-lateral dyssynchrony, RV looked ok.  PYP scan was not suggestive of transthyretin amyloidosis.  Echo in 1/21 with EF 55-60%, normal RV.  RHC/LHC in 4/21 with no significant CAD, normal filling pressures and cardiac output.  Echo this admission showed EF down to 35%.  HS-TnI 385 => 378, likely demand ischemia with hypotension/respiratory arrest.  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 Was on NE. Now off and on midodrine   - Continue midodrine 10 mg tid. BP lower on dialysis days - No BP room for GDMT.  - No chest pain.  - Would like eventual coronary CT to make sure we are not missing an anomalous vessel with unusual coronary  tree on cath.  3. Acute respiratory failure:  Minimally responsive 12/29 and intubated for airway protection.  Extubated 12/30, stable today. Possible aspiration event with baseline OHS. Chest x-ray with increasing LLL airspace disease. Trach aspirate with normal flora, BC negative.  Seen by SLP, dysphagia 3 diet recommended.  - Completed tx w/ ceftriaxone -Resolved.  4. Acute encephalopathy: Head CT and MRI brain without acute process. Bilateral intracranial ICA stenosis (but no significant extracranial disease). EEG with no seizure activity. - Resolved.   5. ESRD: Followed by Dr. Hollie Salk as outpatient.  Volume status optimized on CVVH (stopped 1/1).  - Tolerating iHD. Management per nephrology  - Continue midodrine to 10 mg tid.  6. Pulmonary HTN: Mild PH on 4/21 RHC.  7. Hyperlipidemia: on atorvastatin 80.  8. DM II: A1c 9.1 - Per primary team 9. ID: Elevated WBCs, fever on 12/29. WBCs now trending down. ?Aspiration PNA.  - Vanc/zosyn switched to ceftriaxone. Completed 7 day course     Disposition : TOC following. Family requesting [place]   Has f/u scheduled in HF  clinic.   Home cardiac medications if discharges today: Eliquis 5 mg BID Amiodarone 200 mg BID through 01/20, then 200 mg daily starting 01/21.  Atorvastatin 80 Midodrine 10 mg TID   [facility], PA-C  03/05/2021 7:26 AM  Patient seen with PA, agree with the above note.    Rhythm looks like NSR with PACs versus wandering atrial pacemaker.  Not atrial fibrillation.   General: NAD Neck: JVP 8 cm, no thyromegaly or thyroid nodule.  Lungs: Clear to auscultation bilaterally with normal respiratory effort. CV: Nondisplaced PMI.  Heart irregular S1/S2, no S3/S4, no murmur.  No peripheral edema.   Abdomen: Soft, nontender, no hepatosplenomegaly, no distention.  Skin: Intact without lesions or rashes.  Neurologic: Alert and oriented x 3.  Psych: Normal affect. Extremities: No clubbing or cyanosis.  HEENT: Normal.   She can leave the hospital from my standpoint.  Needs amiodarone taper as above.  Will need followup in CHF clinic.  Will likely arrange coronary CTA in future when rhythm more stable (still fairly irregular with PACs and ?wandering atrial pacemaker).  She does not have BP room for GDMT.   Loralie Champagne 03/05/2021 9:12 AM

## 2021-03-05 NOTE — Progress Notes (Signed)
Report called to Evalee Mutton, LPN [facility] receiving Nurse. Questions were answered; verbalized understanding. Pt transferred out via stretcher by PTAR, going to room 808P. Daughter present at the time of discharge to SNF.

## 2021-03-06 DIAGNOSIS — J9601 Acute respiratory failure with hypoxia: Secondary | ICD-10-CM | POA: Diagnosis not present

## 2021-03-06 DIAGNOSIS — I959 Hypotension, unspecified: Secondary | ICD-10-CM | POA: Diagnosis not present

## 2021-03-06 DIAGNOSIS — R52 Pain, unspecified: Secondary | ICD-10-CM | POA: Diagnosis not present

## 2021-03-06 DIAGNOSIS — N2581 Secondary hyperparathyroidism of renal origin: Secondary | ICD-10-CM | POA: Diagnosis not present

## 2021-03-06 DIAGNOSIS — Z992 Dependence on renal dialysis: Secondary | ICD-10-CM | POA: Diagnosis not present

## 2021-03-06 DIAGNOSIS — N186 End stage renal disease: Secondary | ICD-10-CM | POA: Diagnosis not present

## 2021-03-06 DIAGNOSIS — E1169 Type 2 diabetes mellitus with other specified complication: Secondary | ICD-10-CM | POA: Diagnosis not present

## 2021-03-06 DIAGNOSIS — I4891 Unspecified atrial fibrillation: Secondary | ICD-10-CM | POA: Diagnosis not present

## 2021-03-06 DIAGNOSIS — I5040 Unspecified combined systolic (congestive) and diastolic (congestive) heart failure: Secondary | ICD-10-CM | POA: Diagnosis not present

## 2021-03-06 DIAGNOSIS — D631 Anemia in chronic kidney disease: Secondary | ICD-10-CM | POA: Diagnosis not present

## 2021-03-06 DIAGNOSIS — J449 Chronic obstructive pulmonary disease, unspecified: Secondary | ICD-10-CM | POA: Diagnosis not present

## 2021-03-09 DIAGNOSIS — Z992 Dependence on renal dialysis: Secondary | ICD-10-CM | POA: Diagnosis not present

## 2021-03-09 DIAGNOSIS — D509 Iron deficiency anemia, unspecified: Secondary | ICD-10-CM | POA: Diagnosis not present

## 2021-03-09 DIAGNOSIS — E1122 Type 2 diabetes mellitus with diabetic chronic kidney disease: Secondary | ICD-10-CM | POA: Diagnosis not present

## 2021-03-09 DIAGNOSIS — E8770 Fluid overload, unspecified: Secondary | ICD-10-CM | POA: Diagnosis not present

## 2021-03-09 DIAGNOSIS — D631 Anemia in chronic kidney disease: Secondary | ICD-10-CM | POA: Diagnosis not present

## 2021-03-09 DIAGNOSIS — N186 End stage renal disease: Secondary | ICD-10-CM | POA: Diagnosis not present

## 2021-03-09 DIAGNOSIS — N2581 Secondary hyperparathyroidism of renal origin: Secondary | ICD-10-CM | POA: Diagnosis not present

## 2021-03-09 NOTE — H&P (View-Only) (Signed)
Advanced Heart Failure Clinic Note  PCP: Alroy Dust, L.Marlou Sa, MD HF Cardiology: Dr. Aundra Dubin  81 y.o.with history of ESRD, paroxysmal atrial fibrillation, and chronic diastolic CHF was referred by Dr. Wynonia Lawman for evaluation of CHF.  RHC/LHC was done in 10/15.  This showed markedly elevated filling pressures and pulmonary venous hypertension.  There was no significant CAD.  Last echo in 2/18 showed EF 50-55%, mild LVH, grade 2 diastolic dysfunction. She is followed by Dr. Hollie Salk  for nephrology.   Admitted 10/2-10/6/19 with SOB. RHC showed elevated filling pressures and preserved CO. She was diuresed with IV lasix, then transitioned to torsemide 80 mg am, 40 mg pm. Repeat echo showed EF 45-50% with normal RV. PYP scan was negative for TTR amyloid. PFTs showed restrictive disease. CT chest showed no ILD but did show right hydronephrosis. Renal US showed single kidney with mild to moderate hydronephrosis on right (unchanged since 2011).  DC weight 208 lbs.    In 12/20, she progressed to ESRD and was started on HD.    Echo in 1/21 showed EF 55-60% with normal.  RHC/LHC in 4/21 showed no obstructive CAD, normal filling pressures, mild pulmonary hypertension.  She had atrial fibrillation in 9/21 and underwent DCCV to NSR.   4/22 was seen in ED for acute dyspnea. Found to be in rapid Afib. This was in setting of recent consumption of high caffeine energy drink. She was given IV Cardizem and converted back to NSR and was released same day.  Last seen 5/22 in clinic and was stable NYHA III. She was in NSR, BP controlled and tolerating HD.   Admitted 12/22 with atrial fibrillation with RVR. She was cardioverted in ED to SR with PACs. She was started on amio and Toprol. Unfortunately she was found unresponsive, intubated and transferred to ICU on NE. Code stroke activated. MRI brain without acute process. 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   She had a fever, abx started and cultures sent. She required CVVH but was able to transition to [facility] after extubation. Echo showed EF 30-35% range, worse function in septum; mild RV dysfunction, IVC dilated. She underwent LHC showing no obstructive CAD. 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 She went back into afib requiring IV amiodarone and had successful DCCV 03/03/21.  Today she returns for post hospital HF follow up with her daughter. She is now at [facility]. Overall feeling fine. She will start physical therapy soon. She gets SOB walking short distances on flat ground. This is her baseline. Denies abnormal bleeding, CP, dizziness, edema, or PND/Orthopnea. Appetite ok. No fever or chills. Tolerating dialysis ok.Taking all medications provided by facility.   ECG (personally reviewed):  atrial fibrillation, 102 bpm  Labs (2/18): K 4, creatinine 1.79 Labs (10/19): K 4.5, creatinine 2.4 Labs (12/19): LDL 62 Labs (1/20): K 3.9, creatinine 2.72 Labs (12/20): hgb 11.9 Labs (9/21): LDL 64, HDL 53 Labs (4/21): SCr 4.19, K 3.8  Labs (1/23): K 3.6, creatinine 4.89, hgb 10.5  PMH: 1. Atrial fibrillation: Paroxysmal.  2. HTN 3. 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  5. ESRD 6. H/o IVCD 7. COPD: PFTs (10/19) actually showed severe restriction, concern for interstitial process.  CT chest (10/19) showed mild-moderate patchy air trapping but no evidence for interstitial lung disease.  8. Gout 9. Chronic primarily diastolic CHF: Echo (1/24) with  EF 50-55%, mild LVH, grade 2 diastolic dysfunction.  - LHC/RHC (10/15): small distal LAD but no discrete stenosis.  Mean RA 15, PA 55/20 mean 34, mean PCWP 31 => pulmonary venous hypertension.  - PYP scan (10/19): No evidence for TTR amyloidosis.  - Echo (10/19): EF 45-50%, mild LVH, normal RV size  and systolic function.  - RHC (10/19): mean RA 13, PA 74/27 mean 50, mean PCWP 28, CI 3.12, PVR 3.6 WU. Pulmonary venous hypertension.  - Echo (1/21): EF 55-60%, normal RV.  - LHC/RHC (4/21): super-dominant LCx with small LAD and RCA, no CAD; mean RA 5, PA 47/15, mean PCWP 10, CI 3.61, PVR 2.5 WU.  - Echo (1/23): EF 30-35% range, worse function in septum; mild RV dysfunction, IVC dilated.  - LHC (1/23): no obstructive CAD. 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   10. Negative sleep study in 6/21.   Review of systems complete and found to be negative unless listed in HPI.    Social History   Socioeconomic History   Marital status: Widowed    Spouse name: Not on file   Number of children: 2   Years of education: Not on file   Highest education level: Not on file  Occupational History   Occupation: retired  Tobacco Use   Smoking status: Former    Packs/day: 1.00    Years: 20.00    Pack years: 20.00    Types: Cigarettes    Quit date: 02/22/2009    Years since quitting: 12.0   Smokeless tobacco: Never  Vaping Use   Vaping Use: Never used  Substance and Sexual Activity   Alcohol use: Yes    Alcohol/week: 0.0 standard drinks    Comment: occasional beer   Drug use: Not Currently    Comment: marijuana in the past   Sexual activity: Never  Other Topics Concern   Not on file  Social History Narrative   Not on file   Social Determinants of Health   Financial Resource Strain: Low Risk    Difficulty of Paying Living Expenses: Not very hard  Food Insecurity: No Food Insecurity   Worried About Charity fundraiser in the Last Year: Never true   Ran Out of Food in the Last Year: Never true  Transportation Needs: No Transportation Needs   Lack of Transportation (Medical): No   Lack of Transportation (Non-Medical): No  Physical Activity: Not on file  Stress: Not on file  Social Connections: Not on file  Intimate Partner  Violence: Not on file   Family History  Problem Relation Age of Onset   Lupus Daughter    Cancer Mother 84       type unknown   CVA Father 76   Cancer Brother 5       type unknown   Review of systems complete and found to be negative unless listed in HPI.    Current Outpatient Medications  Medication Sig Dispense Refill   acetaminophen (TYLENOL) 500 MG tablet Take 500 mg by mouth every 6 (six) hours as needed (pain).     albuterol (PROVENTIL HFA;VENTOLIN HFA) 108 (90 BASE) MCG/ACT inhaler Inhale 2 puffs into the lungs every 4 (four) hours as needed for shortness of breath.     amiodarone (PACERONE) 200 MG tablet Take Amiodarone 200 mg BID till 03/13/2021 followed by  Amiodarone 200 mg daily from 03/14/2021 90 tablet 0   atorvastatin (LIPITOR) 80 MG tablet  Take 80 mg by mouth every evening.      B Complex-C-Zn-Folic Acid (DIALYVITE 767-MCNO 15) 0.8 MG TABS Take 1 tablet by mouth in the morning.     Blood Glucose Monitoring Suppl (FREESTYLE LITE) DEVI Use as instructed to check blood sugar 4 times daily (Patient taking differently: 1 each by Other route See admin instructions. Use as instructed to check blood sugar 4 times daily) 1 kit 0   budesonide-formoterol (SYMBICORT) 160-4.5 MCG/ACT inhaler Inhale 2 puffs into the lungs 2 (two) times daily. 1 each 0   Continuous Blood Gluc Receiver (FREESTYLE LIBRE READER) DEVI 1 kit by Does not apply route daily. Use as instructed E11.65 1 each 0   ELIQUIS 5 MG TABS tablet Take 1 tablet by mouth twice daily (Patient taking differently: Take 5 mg by mouth 2 (two) times daily.) 180 tablet 3   glucose blood (FREESTYLE LITE) test strip USE AS DIRECTED 4 TIMES DAILY (Patient taking differently: 1 each by Other route See admin instructions. USE AS DIRECTED 4 TIMES DAILY) 100 each 0   insulin aspart (NOVOLOG) 100 UNIT/ML injection CBG 70 - 120: 0 units CBG 121 - 150: 1 unit CBG 151 - 200: 2 units CBG 201 - 250: 3 units CBG 251 - 300: 5 units CBG 301 - 350: 7  units CBG 351 - 400: 9 units 10 mL 11   ketoconazole (NIZORAL) 2 % cream Apply 1 application topically every morning.     latanoprost (XALATAN) 0.005 % ophthalmic solution Place 1 drop into both eyes at bedtime.     lidocaine (LIDODERM) 5 % Place 1 patch onto the skin daily.     lidocaine (LINDAMANTLE) 3 % CREA cream Apply 1 application topically as needed (Before dialysis on Tuesdays, Thursday, and Saturday.).     midodrine (PROAMATINE) 10 MG tablet Take 1 tablet (10 mg total) by mouth 3 (three) times daily with meals. 90 tablet 3   mirtazapine (REMERON) 15 MG tablet Take 15 mg by mouth at bedtime.     multivitamin (RENA-VIT) TABS tablet Take 1 tablet by mouth at bedtime.  0   Nutritional Supplements (FEEDING SUPPLEMENT, NEPRO CARB STEADY,) LIQD Take 237 mLs by mouth 3 (three) times daily with meals. 21330 mL 2   ondansetron (ZOFRAN) 4 MG tablet Take 4 mg by mouth 2 (two) times daily as needed for nausea.      pantoprazole (PROTONIX) 40 MG tablet Take 40 mg by mouth every evening.      QUEtiapine (SEROQUEL) 25 MG tablet Take 1 tablet (25 mg total) by mouth at bedtime. 30 tablet 0   senna-docusate (SENOKOT-S) 8.6-50 MG tablet Take 1 tablet by mouth at bedtime as needed for mild constipation.     sucroferric oxyhydroxide (VELPHORO) 500 MG chewable tablet Chew 500 mg by mouth See admin instructions. Crush or chew or swallow 1 tablet three times a day with meals and 1 tablet two times a day with snacks.     insulin degludec (TRESIBA FLEXTOUCH) 100 UNIT/ML FlexTouch Pen Inject 18 Units into the skin daily. 15 mL 6   Insulin Pen Needle 32G X 4 MM MISC 1 Device by Does not apply route in the morning, at noon, in the evening, and at bedtime. 400 each 3   ketoconazole (NIZORAL) 2 % shampoo Apply 1 application topically See admin instructions. 2-3 times weekly     No current facility-administered medications for this encounter.   BP (!) 126/54    Pulse (!) 103 Comment: irregular  Wt 87.7 kg (193 lb 6.4  oz)    SpO2 94%    BMI 35.37 kg/m   Body mass index is 35.37 kg/m. Wt Readings from Last 3 Encounters:  03/10/21 87.7 kg (193 lb 6.4 oz)  03/05/21 85.3 kg (188 lb)  10/31/20 89.8 kg (198 lb)   PHYSICAL EXAM: General:  NAD. No resp difficulty, arrived in [facility] HEENT: Normal Neck: Supple. No JVD. Carotids 2+ bilat; no bruits. No lymphadenopathy or thryomegaly appreciated. Cor: PMI nondisplaced. Tachy irregular rate & rhythm. No rubs, gallops or murmurs. Lungs: Clear Abdomen: Obese, nontender, nondistended. No hepatosplenomegaly. No bruits or masses. Good bowel sounds. Extremities: No cyanosis, clubbing, rash, edema; LUE AVF +/+ Neuro: Alert & oriented x 3, cranial nerves grossly intact. Moves all 4 extremities w/o difficulty. Affect pleasant.  Assessment/Plan: 1.  Atrial fibrillation: Known hx paroxysmal AF.  Seen in ED at [facility] 12/22 for AF and spontaneously converted to [place].  Presented 02/17/21 with AF with RVR. Cardioverted in ER.  She was in junctional rhythm after respiratory arrest and intubation with rate upper 40s-50s. Rhythm on 1/3 appeared to be wandering atrial pacemaker, and amiodarone stopped.  Reverted back to AF with RVR and amiodarone restarted. S/p DCCV 1/10 back to NSR. Unfortunately back in AF today, rate 102.  - Keep amiodarone 200 mg bid for now. - Continue Eliquis 5 mg bid. No  bleeding issues. Check CBC today.  - Called facility and confirmed that she has received all of her Eliquis doses since she was discharged. - Discussed with Dr. Aundra Dubin, will arrange DCCV. Patient and daughter are agreeable. 2. Chronic systolic CHF: Echo in 3/00 with EF 50-55%, mild LVH, grade 2 diastolic dysfunction.  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  [place] 11/23/17 showed significantly elevated right and left heart filling pressures and severe primarily pulmonary venous hypertension. Cardiac output preserved. Echo (10/19)  with EF 45-50%, septal-lateral dyssynchrony, RV looked ok.  PYP scan was not suggestive of transthyretin amyloidosis.  Echo in 1/21 with EF 55-60%, normal RV.  RHC/LHC in 4/21 with no significant CAD, normal filling pressures and cardiac output.  Echo 1/23 showed EF down to 35%.  HS-TnI 385 => 378, likely demand ischemia with hypotension/respiratory arrest.  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 Chronically NYHA III, volume per HD.  - Continue midodrine 10 mg tid. BP lower on dialysis days. - No BP room for GDMT.  - No chest pain.  - Would like eventual coronary CT to make sure we are not missing an anomalous vessel with unusual coronary tree on cath.  3. ESRD: Followed by Dr. Hollie Salk.  - Tolerating iHD. Management per nephrology  - Continue midodrine to 10 mg tid.  4. Pulmonary HTN: Mild PH on 4/21 RHC.  5. Hyperlipidemia: Continue statin.  6. DM II: A1c 9.1. She is on insulin.  Arrange for DCCV with Dr. Aundra Dubin this week. Will need coronary CT when her rhythm is stable.  Allena Katz, FNP-BC 03/10/21

## 2021-03-09 NOTE — Progress Notes (Signed)
Advanced Heart Failure Clinic Note  PCP: Alroy Dust, L.Marlou Sa, MD HF Cardiology: Dr. Aundra Dubin  81 y.o.with history of ESRD, paroxysmal atrial fibrillation, and chronic diastolic CHF was referred by Dr. Wynonia Lawman for evaluation of CHF.  RHC/LHC was done in 10/15.  This showed markedly elevated filling pressures and pulmonary venous hypertension.  There was no significant CAD.  Last echo in 2/18 showed EF 50-55%, mild LVH, grade 2 diastolic dysfunction. She is followed by Dr. Hollie Salk  for nephrology.   Admitted 10/2-10/6/19 with SOB. RHC showed elevated filling pressures and preserved CO. She was diuresed with IV lasix, then transitioned to torsemide 80 mg am, 40 mg pm. Repeat echo showed EF 45-50% with normal RV. PYP scan was negative for TTR amyloid. PFTs showed restrictive disease. CT chest showed no ILD but did show right hydronephrosis. Renal US showed single kidney with mild to moderate hydronephrosis on right (unchanged since 2011).  DC weight 208 lbs.    In 12/20, she progressed to ESRD and was started on HD.    Echo in 1/21 showed EF 55-60% with normal.  RHC/LHC in 4/21 showed no obstructive CAD, normal filling pressures, mild pulmonary hypertension.  She had atrial fibrillation in 9/21 and underwent DCCV to NSR.   4/22 was seen in ED for acute dyspnea. Found to be in rapid Afib. This was in setting of recent consumption of high caffeine energy drink. She was given IV Cardizem and converted back to NSR and was released same day.  Last seen 5/22 in clinic and was stable NYHA III. She was in NSR, BP controlled and tolerating HD.   Admitted 12/22 with atrial fibrillation with RVR. She was cardioverted in ED to SR with PACs. She was started on amio and Toprol. Unfortunately she was found unresponsive, intubated and transferred to ICU on NE. Code stroke activated. MRI brain without acute process. 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   She had a fever, abx started and cultures sent. She required CVVH but was able to transition to [facility] after extubation. Echo showed EF 30-35% range, worse function in septum; mild RV dysfunction, IVC dilated. She underwent LHC showing no obstructive CAD. 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 She went back into afib requiring IV amiodarone and had successful DCCV 03/03/21.  Today she returns for post hospital HF follow up with her daughter. She is now at [facility]. Overall feeling fine. She will start physical therapy soon. She gets SOB walking short distances on flat ground. This is her baseline. Denies abnormal bleeding, CP, dizziness, edema, or PND/Orthopnea. Appetite ok. No fever or chills. Tolerating dialysis ok.Taking all medications provided by facility.   ECG (personally reviewed):  atrial fibrillation, 102 bpm  Labs (2/18): K 4, creatinine 1.79 Labs (10/19): K 4.5, creatinine 2.4 Labs (12/19): LDL 62 Labs (1/20): K 3.9, creatinine 2.72 Labs (12/20): hgb 11.9 Labs (9/21): LDL 64, HDL 53 Labs (4/21): SCr 4.19, K 3.8  Labs (1/23): K 3.6, creatinine 4.89, hgb 10.5  PMH: 1. Atrial fibrillation: Paroxysmal.  2. HTN 3. 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  5. ESRD 6. H/o IVCD 7. COPD: PFTs (10/19) actually showed severe restriction, concern for interstitial process.  CT chest (10/19) showed mild-moderate patchy air trapping but no evidence for interstitial lung disease.  8. Gout 9. Chronic primarily diastolic CHF: Echo (4/76) with  EF 50-55%, mild LVH, grade 2 diastolic dysfunction.  - LHC/RHC (10/15): small distal LAD but no discrete stenosis.  Mean RA 15, PA 55/20 mean 34, mean PCWP 31 => pulmonary venous hypertension.  - PYP scan (10/19): No evidence for TTR amyloidosis.  - Echo (10/19): EF 45-50%, mild LVH, normal RV size  and systolic function.  - RHC (10/19): mean RA 13, PA 74/27 mean 50, mean PCWP 28, CI 3.12, PVR 3.6 WU. Pulmonary venous hypertension.  - Echo (1/21): EF 55-60%, normal RV.  - LHC/RHC (4/21): super-dominant LCx with small LAD and RCA, no CAD; mean RA 5, PA 47/15, mean PCWP 10, CI 3.61, PVR 2.5 WU.  - Echo (1/23): EF 30-35% range, worse function in septum; mild RV dysfunction, IVC dilated.  - LHC (1/23): no obstructive CAD. 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   10. Negative sleep study in 6/21.   Review of systems complete and found to be negative unless listed in HPI.    Social History   Socioeconomic History   Marital status: Widowed    Spouse name: Not on file   Number of children: 2   Years of education: Not on file   Highest education level: Not on file  Occupational History   Occupation: retired  Tobacco Use   Smoking status: Former    Packs/day: 1.00    Years: 20.00    Pack years: 20.00    Types: Cigarettes    Quit date: 02/22/2009    Years since quitting: 12.0   Smokeless tobacco: Never  Vaping Use   Vaping Use: Never used  Substance and Sexual Activity   Alcohol use: Yes    Alcohol/week: 0.0 standard drinks    Comment: occasional beer   Drug use: Not Currently    Comment: marijuana in the past   Sexual activity: Never  Other Topics Concern   Not on file  Social History Narrative   Not on file   Social Determinants of Health   Financial Resource Strain: Low Risk    Difficulty of Paying Living Expenses: Not very hard  Food Insecurity: No Food Insecurity   Worried About Charity fundraiser in the Last Year: Never true   Ran Out of Food in the Last Year: Never true  Transportation Needs: No Transportation Needs   Lack of Transportation (Medical): No   Lack of Transportation (Non-Medical): No  Physical Activity: Not on file  Stress: Not on file  Social Connections: Not on file  Intimate Partner  Violence: Not on file   Family History  Problem Relation Age of Onset   Lupus Daughter    Cancer Mother 52       type unknown   CVA Father 28   Cancer Brother 53       type unknown   Review of systems complete and found to be negative unless listed in HPI.    Current Outpatient Medications  Medication Sig Dispense Refill   acetaminophen (TYLENOL) 500 MG tablet Take 500 mg by mouth every 6 (six) hours as needed (pain).     albuterol (PROVENTIL HFA;VENTOLIN HFA) 108 (90 BASE) MCG/ACT inhaler Inhale 2 puffs into the lungs every 4 (four) hours as needed for shortness of breath.     amiodarone (PACERONE) 200 MG tablet Take Amiodarone 200 mg BID till 03/13/2021 followed by  Amiodarone 200 mg daily from 03/14/2021 90 tablet 0   atorvastatin (LIPITOR) 80 MG tablet  Take 80 mg by mouth every evening.      B Complex-C-Zn-Folic Acid (DIALYVITE 035-KKXF 15) 0.8 MG TABS Take 1 tablet by mouth in the morning.     Blood Glucose Monitoring Suppl (FREESTYLE LITE) DEVI Use as instructed to check blood sugar 4 times daily (Patient taking differently: 1 each by Other route See admin instructions. Use as instructed to check blood sugar 4 times daily) 1 kit 0   budesonide-formoterol (SYMBICORT) 160-4.5 MCG/ACT inhaler Inhale 2 puffs into the lungs 2 (two) times daily. 1 each 0   Continuous Blood Gluc Receiver (FREESTYLE LIBRE READER) DEVI 1 kit by Does not apply route daily. Use as instructed E11.65 1 each 0   ELIQUIS 5 MG TABS tablet Take 1 tablet by mouth twice daily (Patient taking differently: Take 5 mg by mouth 2 (two) times daily.) 180 tablet 3   glucose blood (FREESTYLE LITE) test strip USE AS DIRECTED 4 TIMES DAILY (Patient taking differently: 1 each by Other route See admin instructions. USE AS DIRECTED 4 TIMES DAILY) 100 each 0   insulin aspart (NOVOLOG) 100 UNIT/ML injection CBG 70 - 120: 0 units CBG 121 - 150: 1 unit CBG 151 - 200: 2 units CBG 201 - 250: 3 units CBG 251 - 300: 5 units CBG 301 - 350: 7  units CBG 351 - 400: 9 units 10 mL 11   ketoconazole (NIZORAL) 2 % cream Apply 1 application topically every morning.     latanoprost (XALATAN) 0.005 % ophthalmic solution Place 1 drop into both eyes at bedtime.     lidocaine (LIDODERM) 5 % Place 1 patch onto the skin daily.     lidocaine (LINDAMANTLE) 3 % CREA cream Apply 1 application topically as needed (Before dialysis on Tuesdays, Thursday, and Saturday.).     midodrine (PROAMATINE) 10 MG tablet Take 1 tablet (10 mg total) by mouth 3 (three) times daily with meals. 90 tablet 3   mirtazapine (REMERON) 15 MG tablet Take 15 mg by mouth at bedtime.     multivitamin (RENA-VIT) TABS tablet Take 1 tablet by mouth at bedtime.  0   Nutritional Supplements (FEEDING SUPPLEMENT, NEPRO CARB STEADY,) LIQD Take 237 mLs by mouth 3 (three) times daily with meals. 21330 mL 2   ondansetron (ZOFRAN) 4 MG tablet Take 4 mg by mouth 2 (two) times daily as needed for nausea.      pantoprazole (PROTONIX) 40 MG tablet Take 40 mg by mouth every evening.      QUEtiapine (SEROQUEL) 25 MG tablet Take 1 tablet (25 mg total) by mouth at bedtime. 30 tablet 0   senna-docusate (SENOKOT-S) 8.6-50 MG tablet Take 1 tablet by mouth at bedtime as needed for mild constipation.     sucroferric oxyhydroxide (VELPHORO) 500 MG chewable tablet Chew 500 mg by mouth See admin instructions. Crush or chew or swallow 1 tablet three times a day with meals and 1 tablet two times a day with snacks.     insulin degludec (TRESIBA FLEXTOUCH) 100 UNIT/ML FlexTouch Pen Inject 18 Units into the skin daily. 15 mL 6   Insulin Pen Needle 32G X 4 MM MISC 1 Device by Does not apply route in the morning, at noon, in the evening, and at bedtime. 400 each 3   ketoconazole (NIZORAL) 2 % shampoo Apply 1 application topically See admin instructions. 2-3 times weekly     No current facility-administered medications for this encounter.   BP (!) 126/54    Pulse (!) 103 Comment: irregular  Wt 87.7 kg (193 lb 6.4  oz)    SpO2 94%    BMI 35.37 kg/m   Body mass index is 35.37 kg/m. Wt Readings from Last 3 Encounters:  03/10/21 87.7 kg (193 lb 6.4 oz)  03/05/21 85.3 kg (188 lb)  10/31/20 89.8 kg (198 lb)   PHYSICAL EXAM: General:  NAD. No resp difficulty, arrived in [facility] HEENT: Normal Neck: Supple. No JVD. Carotids 2+ bilat; no bruits. No lymphadenopathy or thryomegaly appreciated. Cor: PMI nondisplaced. Tachy irregular rate & rhythm. No rubs, gallops or murmurs. Lungs: Clear Abdomen: Obese, nontender, nondistended. No hepatosplenomegaly. No bruits or masses. Good bowel sounds. Extremities: No cyanosis, clubbing, rash, edema; LUE AVF +/+ Neuro: Alert & oriented x 3, cranial nerves grossly intact. Moves all 4 extremities w/o difficulty. Affect pleasant.  Assessment/Plan: 1.  Atrial fibrillation: Known hx paroxysmal AF.  Seen in ED at [facility] 12/22 for AF and spontaneously converted to Gem.  Presented 02/17/21 with AF with RVR. Cardioverted in ER.  She was in junctional rhythm after respiratory arrest and intubation with rate upper 40s-50s. Rhythm on 1/3 appeared to be wandering atrial pacemaker, and amiodarone stopped.  Reverted back to AF with RVR and amiodarone restarted. S/p DCCV 1/10 back to NSR. Unfortunately back in AF today, rate 102.  - Keep amiodarone 200 mg bid for now. - Continue Eliquis 5 mg bid. No  bleeding issues. Check CBC today.  - Called facility and confirmed that she has received all of her Eliquis doses since she was discharged. - Discussed with Dr. Aundra Dubin, will arrange DCCV. Patient and daughter are agreeable. 2. Chronic systolic CHF: Echo in 4/23 with EF 50-55%, mild LVH, grade 2 diastolic dysfunction.  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  [facility] [place] 11/23/17 showed significantly elevated right and left heart filling pressures and severe primarily pulmonary venous hypertension. Cardiac output preserved. Echo (10/19)  with EF 45-50%, septal-lateral dyssynchrony, RV looked ok.  PYP scan was not suggestive of transthyretin amyloidosis.  Echo in 1/21 with EF 55-60%, normal RV.  RHC/LHC in 4/21 with no significant CAD, normal filling pressures and cardiac output.  Echo 1/23 showed EF down to 35%.  HS-TnI 385 => 378, likely demand ischemia with hypotension/respiratory arrest.  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 Chronically NYHA III, volume per HD.  - Continue midodrine 10 mg tid. BP lower on dialysis days. - No BP room for GDMT.  - No chest pain.  - Would like eventual coronary CT to make sure we are not missing an anomalous vessel with unusual coronary tree on cath.  3. ESRD: Followed by Dr. Hollie Salk.  - Tolerating iHD. Management per nephrology  - Continue midodrine to 10 mg tid.  4. Pulmonary HTN: Mild PH on 4/21 RHC.  5. Hyperlipidemia: Continue statin.  6. DM II: A1c 9.1. She is on insulin.  Arrange for DCCV with Dr. Aundra Dubin this week. Will need coronary CT when her rhythm is stable.  Allena Katz, FNP-BC 03/10/21

## 2021-03-10 ENCOUNTER — Other Ambulatory Visit (HOSPITAL_COMMUNITY): Payer: Self-pay

## 2021-03-10 ENCOUNTER — Ambulatory Visit (HOSPITAL_COMMUNITY)
Admit: 2021-03-10 | Discharge: 2021-03-10 | Disposition: A | Discharge status: Home / Self Care | Payer: HMO | Source: Ambulatory Visit | Attending: Family Medicine | Admitting: Family Medicine

## 2021-03-10 ENCOUNTER — Other Ambulatory Visit: Payer: Self-pay

## 2021-03-10 ENCOUNTER — Encounter (HOSPITAL_COMMUNITY): Payer: Self-pay

## 2021-03-10 VITALS — BP 126/54 | HR 103 | Wt 193.4 lb

## 2021-03-10 DIAGNOSIS — E1122 Type 2 diabetes mellitus with diabetic chronic kidney disease: Secondary | ICD-10-CM

## 2021-03-10 DIAGNOSIS — I482 Chronic atrial fibrillation, unspecified: Secondary | ICD-10-CM

## 2021-03-10 DIAGNOSIS — I5042 Chronic combined systolic (congestive) and diastolic (congestive) heart failure: Secondary | ICD-10-CM | POA: Diagnosis not present

## 2021-03-10 DIAGNOSIS — I480 Paroxysmal atrial fibrillation: Secondary | ICD-10-CM

## 2021-03-10 DIAGNOSIS — N186 End stage renal disease: Secondary | ICD-10-CM

## 2021-03-10 DIAGNOSIS — E785 Hyperlipidemia, unspecified: Secondary | ICD-10-CM

## 2021-03-10 DIAGNOSIS — R0602 Shortness of breath: Secondary | ICD-10-CM | POA: Insufficient documentation

## 2021-03-10 DIAGNOSIS — I5022 Chronic systolic (congestive) heart failure: Secondary | ICD-10-CM

## 2021-03-10 DIAGNOSIS — E1169 Type 2 diabetes mellitus with other specified complication: Secondary | ICD-10-CM | POA: Diagnosis not present

## 2021-03-10 DIAGNOSIS — J9601 Acute respiratory failure with hypoxia: Secondary | ICD-10-CM | POA: Diagnosis not present

## 2021-03-10 DIAGNOSIS — I132 Hypertensive heart and chronic kidney disease with heart failure and with stage 5 chronic kidney disease, or end stage renal disease: Secondary | ICD-10-CM | POA: Diagnosis not present

## 2021-03-10 DIAGNOSIS — E662 Morbid (severe) obesity with alveolar hypoventilation: Secondary | ICD-10-CM | POA: Diagnosis not present

## 2021-03-10 DIAGNOSIS — J449 Chronic obstructive pulmonary disease, unspecified: Secondary | ICD-10-CM | POA: Diagnosis not present

## 2021-03-10 DIAGNOSIS — Z79899 Other long term (current) drug therapy: Secondary | ICD-10-CM | POA: Insufficient documentation

## 2021-03-10 DIAGNOSIS — Z87891 Personal history of nicotine dependence: Secondary | ICD-10-CM | POA: Diagnosis not present

## 2021-03-10 DIAGNOSIS — I272 Pulmonary hypertension, unspecified: Secondary | ICD-10-CM

## 2021-03-10 DIAGNOSIS — Z09 Encounter for follow-up examination after completed treatment for conditions other than malignant neoplasm: Secondary | ICD-10-CM | POA: Insufficient documentation

## 2021-03-10 DIAGNOSIS — Z794 Long term (current) use of insulin: Secondary | ICD-10-CM | POA: Diagnosis not present

## 2021-03-10 DIAGNOSIS — Z6834 Body mass index (BMI) 34.0-34.9, adult: Secondary | ICD-10-CM | POA: Diagnosis not present

## 2021-03-10 DIAGNOSIS — R1310 Dysphagia, unspecified: Secondary | ICD-10-CM | POA: Diagnosis not present

## 2021-03-10 DIAGNOSIS — Z992 Dependence on renal dialysis: Secondary | ICD-10-CM

## 2021-03-10 DIAGNOSIS — Z7901 Long term (current) use of anticoagulants: Secondary | ICD-10-CM | POA: Diagnosis not present

## 2021-03-10 DIAGNOSIS — I2510 Atherosclerotic heart disease of native coronary artery without angina pectoris: Secondary | ICD-10-CM | POA: Diagnosis not present

## 2021-03-10 DIAGNOSIS — I5043 Acute on chronic combined systolic (congestive) and diastolic (congestive) heart failure: Secondary | ICD-10-CM | POA: Diagnosis not present

## 2021-03-10 LAB — BASIC METABOLIC PANEL
Anion gap: 13 (ref 5–15)
BUN: 16 mg/dL (ref 8–23)
CO2: 30 mmol/L (ref 22–32)
Calcium: 9.3 mg/dL (ref 8.9–10.3)
Chloride: 93 mmol/L — ABNORMAL LOW (ref 98–111)
Creatinine, Ser: 5.52 mg/dL — ABNORMAL HIGH (ref 0.44–1.00)
GFR, Estimated: 7 mL/min — ABNORMAL LOW (ref >60)
Glucose, Bld: 189 mg/dL — ABNORMAL HIGH (ref 70–99)
Potassium: 3.3 mmol/L — ABNORMAL LOW (ref 3.5–5.1)
Sodium: 136 mmol/L (ref 135–145)

## 2021-03-10 LAB — CBC
HCT: 38.4 % (ref 36.0–46.0)
Hemoglobin: 12.6 g/dL (ref 12.0–15.0)
MCH: 31.5 pg (ref 26.0–34.0)
MCHC: 32.8 g/dL (ref 30.0–36.0)
MCV: 96 fL (ref 80.0–100.0)
Platelets: 231 10*3/uL (ref 150–400)
RBC: 4 MIL/uL (ref 3.87–5.11)
RDW: 15.8 % — ABNORMAL HIGH (ref 11.5–15.5)
WBC: 8.6 10*3/uL (ref 4.0–10.5)
nRBC: 0 % (ref 0.0–0.2)

## 2021-03-10 MED ORDER — AMIODARONE HCL 200 MG PO TABS: 200.0000 mg | ORAL_TABLET | Freq: Two times a day (BID) | ORAL | 3 refills | Status: DC

## 2021-03-10 NOTE — Patient Instructions (Signed)
CONTINUE Amiodarone 200mg  (1 tab) twice a day.  DO NOT TAPER DOWN  Labs today We will only contact you if something comes back abnormal or we need to make some changes. Otherwise no news is good news!  Your physician recommends that you schedule a follow-up appointment in: 2-3 weeks with Dr Aundra Dubin  Please call office at [phone] option [phone] if you have any questions or concerns.   Dear Stephanie Woodward are scheduled for a Cardioversion on Thursday, January 19th, 2023 with Dr. Aundra Dubin.  Please arrive at the [facility] (Main Entrance A) at [facility]: [address] [place], [place] [address] at [place]. (1 hour prior to procedure unless lab work is needed; if lab work is needed arrive 1.5 hours ahead)  DIET: Nothing to eat or drink after midnight except a sip of water with medications (see medication instructions below)  Medication Instructions: Take Amiodarone and Eliquis on the morning of your procedure before you come to hospital  Hold all other medications on the morning of your procedure  Hold ALL Diabetes medications on the morning of your procedure  Continue your anticoagulant: Eliquis You will need to continue your anticoagulant after your procedure until you  are told by your  Provider that it is safe to stop   Labs: done in office today   You must have a responsible person to drive you home and stay in the waiting area during your procedure. Failure to do so could result in cancellation.  Bring your insurance cards.  *Special Note: Every effort is made to have your procedure done on time. Occasionally there are emergencies that occur at the hospital that may cause delays. Please be patient if a delay does occur.

## 2021-03-11 NOTE — Anesthesia Preprocedure Evaluation (Addendum)
Anesthesia Evaluation  Patient identified by MRN, date of birth, ID band Patient awake    Reviewed: Allergy & Precautions, NPO status , Patient's Chart, lab work & pertinent test results  Airway Mallampati: I  TM Distance: >3 FB Neck ROM: Full    Dental  (+) Edentulous Lower, Edentulous Upper   Pulmonary asthma , sleep apnea , COPD,  COPD inhaler, former smoker,     + decreased breath sounds      Cardiovascular hypertension, Pt. on medications and Pt. on home beta blockers +CHF   Rhythm:Regular Rate:Normal  Echo: 1. Left ventricular ejection fraction, by estimation, is 25 to 30%. The  left ventricle has severely decreased function. The left ventricle  demonstrates regional wall motion abnormalities (see scoring  diagram/findings for description). There is mild left  ventricular hypertrophy. Left ventricular diastolic parameters are  consistent with Grade II diastolic dysfunction (pseudonormalization).  2. Right ventricular systolic function is mildly reduced. The right  ventricular size is normal. There is normal pulmonary artery systolic  pressure.  3. Left atrial size was mildly dilated.  4. Right atrial size was mildly dilated.  5. 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 Trivial mitral valve  regurgitation. No evidence of mitral stenosis.  6. The aortic valve was not well visualized. Aortic valve regurgitation  is not visualized. No aortic stenosis is present.  7. The inferior vena cava is dilated in size with >50% respiratory  variability, suggesting right atrial pressure of 8 mmHg.    Neuro/Psych negative neurological ROS  negative psych ROS   GI/Hepatic Neg liver ROS, GERD  Medicated,  Endo/Other  diabetes, Type 2, Insulin Dependent  Renal/GU Dialysis and ESRFRenal  disease     Musculoskeletal  (+) Arthritis , Osteoarthritis,    Abdominal Normal abdominal exam  (+)   Peds negative pediatric ROS (+)  Hematology negative hematology ROS (+)   Anesthesia Other Findings   Reproductive/Obstetrics                           Anesthesia Physical Anesthesia Plan  ASA: 3  Anesthesia Plan: General   Post-op Pain Management:    Induction: Intravenous  PONV Risk Score and Plan: 2 and Treatment may vary due to age or medical condition  Airway Management Planned: Mask  Additional Equipment: None  Intra-op Plan:   Post-operative Plan: Extubation in OR  Informed Consent: I have reviewed the patients History and Physical, chart, labs and discussed the procedure including the risks, benefits and alternatives for the proposed anesthesia with the patient or authorized representative who has indicated his/her understanding and acceptance.     Dental advisory given  Plan Discussed with: Anesthesiologist and CRNA  Anesthesia Plan Comments:        Anesthesia Quick Evaluation

## 2021-03-12 ENCOUNTER — Ambulatory Visit (HOSPITAL_COMMUNITY): Payer: HMO | Admitting: Certified Registered Nurse Anesthetist

## 2021-03-12 ENCOUNTER — Encounter (HOSPITAL_COMMUNITY)
Admission: RE | Disposition: A | Discharge status: Home / Self Care | Payer: Self-pay | Source: Home / Self Care | Attending: Cardiology

## 2021-03-12 ENCOUNTER — Ambulatory Visit (HOSPITAL_COMMUNITY)
Admission: RE | Admit: 2021-03-12 | Discharge: 2021-03-12 | Disposition: A | Discharge status: Home / Self Care | Payer: HMO | Attending: Cardiology | Admitting: Cardiology

## 2021-03-12 ENCOUNTER — Encounter (HOSPITAL_COMMUNITY): Payer: Self-pay | Admitting: Cardiology

## 2021-03-12 DIAGNOSIS — E1122 Type 2 diabetes mellitus with diabetic chronic kidney disease: Secondary | ICD-10-CM | POA: Diagnosis not present

## 2021-03-12 DIAGNOSIS — Z992 Dependence on renal dialysis: Secondary | ICD-10-CM | POA: Diagnosis not present

## 2021-03-12 DIAGNOSIS — F5101 Primary insomnia: Secondary | ICD-10-CM | POA: Diagnosis not present

## 2021-03-12 DIAGNOSIS — Z79899 Other long term (current) drug therapy: Secondary | ICD-10-CM | POA: Insufficient documentation

## 2021-03-12 DIAGNOSIS — N186 End stage renal disease: Secondary | ICD-10-CM | POA: Diagnosis not present

## 2021-03-12 DIAGNOSIS — I509 Heart failure, unspecified: Secondary | ICD-10-CM | POA: Diagnosis not present

## 2021-03-12 DIAGNOSIS — Z87891 Personal history of nicotine dependence: Secondary | ICD-10-CM | POA: Diagnosis not present

## 2021-03-12 DIAGNOSIS — I132 Hypertensive heart and chronic kidney disease with heart failure and with stage 5 chronic kidney disease, or end stage renal disease: Secondary | ICD-10-CM | POA: Diagnosis not present

## 2021-03-12 DIAGNOSIS — Z794 Long term (current) use of insulin: Secondary | ICD-10-CM | POA: Insufficient documentation

## 2021-03-12 DIAGNOSIS — Z7901 Long term (current) use of anticoagulants: Secondary | ICD-10-CM | POA: Diagnosis not present

## 2021-03-12 DIAGNOSIS — I272 Pulmonary hypertension, unspecified: Secondary | ICD-10-CM | POA: Insufficient documentation

## 2021-03-12 DIAGNOSIS — J449 Chronic obstructive pulmonary disease, unspecified: Secondary | ICD-10-CM | POA: Insufficient documentation

## 2021-03-12 DIAGNOSIS — I4891 Unspecified atrial fibrillation: Secondary | ICD-10-CM | POA: Diagnosis not present

## 2021-03-12 DIAGNOSIS — K219 Gastro-esophageal reflux disease without esophagitis: Secondary | ICD-10-CM | POA: Insufficient documentation

## 2021-03-12 DIAGNOSIS — G4730 Sleep apnea, unspecified: Secondary | ICD-10-CM | POA: Diagnosis not present

## 2021-03-12 DIAGNOSIS — I5032 Chronic diastolic (congestive) heart failure: Secondary | ICD-10-CM | POA: Insufficient documentation

## 2021-03-12 DIAGNOSIS — M1990 Unspecified osteoarthritis, unspecified site: Secondary | ICD-10-CM | POA: Diagnosis not present

## 2021-03-12 DIAGNOSIS — I482 Chronic atrial fibrillation, unspecified: Secondary | ICD-10-CM

## 2021-03-12 HISTORY — PX: CARDIOVERSION: SHX1299

## 2021-03-12 LAB — GLUCOSE, CAPILLARY
Glucose-Capillary: 48 mg/dL — ABNORMAL LOW (ref 70–99)
Glucose-Capillary: 50 mg/dL — ABNORMAL LOW (ref 70–99)
Glucose-Capillary: 95 mg/dL (ref 70–99)

## 2021-03-12 SURGERY — CARDIOVERSION
Anesthesia: General

## 2021-03-12 MED ORDER — DEXTROSE 50 % IV SOLN
INTRAVENOUS | Status: AC
  Filled 2021-03-12: qty 50

## 2021-03-12 MED ORDER — PROPOFOL 10 MG/ML IV BOLUS
INTRAVENOUS | Status: DC | PRN
Start: 2021-03-12 — End: 2021-03-12
  Administered 2021-03-12: 50 mg via INTRAVENOUS

## 2021-03-12 MED ORDER — SODIUM CHLORIDE 0.9 % IV SOLN: INTRAVENOUS | Status: DC

## 2021-03-12 MED ORDER — APIXABAN 5 MG PO TABS
5.0000 mg | ORAL_TABLET | Freq: Two times a day (BID) | ORAL | Status: DC
  Administered 2021-03-12: 5 mg via ORAL
  Filled 2021-03-12: qty 1

## 2021-03-12 MED ORDER — PHENYLEPHRINE 40 MCG/ML (10ML) SYRINGE FOR IV PUSH (FOR BLOOD PRESSURE SUPPORT)
PREFILLED_SYRINGE | INTRAVENOUS | Status: AC
  Filled 2021-03-12: qty 30

## 2021-03-12 MED ORDER — LIDOCAINE 2% (20 MG/ML) 5 ML SYRINGE
INTRAMUSCULAR | Status: DC | PRN
  Administered 2021-03-12: 80 mg via INTRAVENOUS

## 2021-03-12 MED ORDER — LIDOCAINE 2% (20 MG/ML) 5 ML SYRINGE
INTRAMUSCULAR | Status: AC
  Filled 2021-03-12: qty 25

## 2021-03-12 MED ORDER — DEXTROSE 50 % IV SOLN
25.0000 mL | Freq: Once | INTRAVENOUS | Status: AC
  Administered 2021-03-12: 25 mL via INTRAVENOUS

## 2021-03-12 NOTE — Transfer of Care (Signed)
Immediate Anesthesia Transfer of Care Note  Patient: Stephanie Woodward  Procedure(s) Performed: CARDIOVERSION  Patient Location: PACU and Endoscopy Unit  Anesthesia Type:General  Level of Consciousness: drowsy  Airway & Oxygen Therapy: Patient connected to face mask oxygen  Post-op Assessment: Report given to RN and Post -op Vital signs reviewed and stable  Post vital signs: Reviewed and stable  Last Vitals:  Vitals Value Taken Time  BP    Temp    Pulse    Resp    SpO2      Last Pain:  Vitals:   03/12/21 0700  TempSrc: Temporal  PainSc: 0-No pain         Complications: No notable events documented.

## 2021-03-12 NOTE — Interval H&P Note (Signed)
History and Physical Interval Note:  03/12/2021 7:57 AM  Stephanie Woodward  has presented today for surgery, with the diagnosis of afib.  The various methods of treatment have been discussed with the patient and family. After consideration of risks, benefits and other options for treatment, the patient has consented to  Procedure(s): CARDIOVERSION (N/A) as a surgical intervention.  The patient's history has been reviewed, patient examined, no change in status, stable for surgery.  I have reviewed the patient's chart and labs.  Questions were answered to the patient's satisfaction.     Stephanie Woodward

## 2021-03-12 NOTE — Procedures (Signed)
Electrical Cardioversion Procedure Note Stephanie Woodward [id number] 17-Oct-1940  Procedure: Electrical Cardioversion Indications:  Atrial Fibrillation  Procedure Details Consent: Risks of procedure as well as the alternatives and risks of each were explained to the (patient/caregiver).  Consent for procedure obtained. Time Out: Verified patient identification, verified procedure, site/side was marked, verified correct patient position, special equipment/implants available, medications/allergies/relevent history reviewed, required imaging and test results available.  Performed  Patient placed on cardiac monitor, pulse oximetry, supplemental oxygen as necessary.  Sedation given:  Propofol per anesthesiology Pacer pads placed anterior and posterior chest.  Cardioverted 1 time(s).  Cardioverted at [place].  Evaluation Findings: Post procedure EKG shows: NSR Complications: None Patient did tolerate procedure well.   Loralie Champagne 03/12/2021, 8:06 AM

## 2021-03-12 NOTE — Anesthesia Procedure Notes (Signed)
Procedure Name: General with mask airway Date/Time: 03/12/2021 7:58 AM Performed by: Mariea Clonts, CRNA Pre-anesthesia Checklist: Timeout performed, Patient being monitored, Suction available, Emergency Drugs available and Patient identified Patient Re-evaluated:Patient Re-evaluated prior to induction Oxygen Delivery Method: Circle system utilized and Simple face mask Preoxygenation: Pre-oxygenation with 100% oxygen Induction Type: IV induction

## 2021-03-12 NOTE — Anesthesia Postprocedure Evaluation (Signed)
Anesthesia Post Note  Patient: Stephanie Woodward  Procedure(s) Performed: CARDIOVERSION     Patient location during evaluation: PACU Anesthesia Type: General Level of consciousness: awake Pain management: pain level controlled Vital Signs Assessment: post-procedure vital signs reviewed and stable Respiratory status: spontaneous breathing and respiratory function stable Cardiovascular status: stable Postop Assessment: no apparent nausea or vomiting Anesthetic complications: no   No notable events documented.  Last Vitals:  Vitals:   03/12/21 0700 03/12/21 0806  BP: 114/90 (!) 100/54  Pulse: (!) 112 70  Resp: 20 15  Temp: (!) 36.2 C   SpO2: 92% 100%    Last Pain:  Vitals:   03/12/21 0806  TempSrc:   PainSc: Asleep                 Merlinda Frederick

## 2021-03-12 NOTE — Discharge Instructions (Addendum)
Electrical Cardioversion  Electrical cardioversion is the delivery of a jolt of electricity to restore a normal rhythm to the heart. A rhythm that is too fast or is not regular keeps the heart from pumping well. In this procedure, sticky patches or metal paddles are placed on the chest to deliver electricity to the heart from a device.  If this information does not answer your questions, please call [place] office at [phone] to clarify.   Follow these instructions at home: You may have some redness on the skin where the shocks were given.  You may apply over-the-counter hydrocortisone cream or aloe vera to alleviate skin irritation. YOU SHOULD NOT DRIVE, use power tools, machinery or perform tasks that involve climbing or major physical exertion for 24 hours (because of the sedation medicines used during the test).  Take over-the-counter and prescription medicines only as told by your health care provider. Ask your health care provider how to check your pulse. Check it often. Rest for 48 hours after the procedure or as told by your health care provider. Avoid or limit your caffeine use as told by your health care provider. Keep all follow-up visits as told by your health care provider. This is important.  FOLLOW UP:  Please also call with any specific questions about appointments or follow up tests.  Electrical Cardioversion Electrical cardioversion is the delivery of a jolt of electricity to restore a normal rhythm to the heart. A rhythm that is too fast or is not regular keeps the heart from pumping well. In this procedure, sticky patches or metal paddles are placed on the chest to deliver electricity to the heart from a device. This procedure may be done in an emergency if: There is low or no blood pressure as a result of the heart rhythm. Normal rhythm must be restored as fast as possible to protect the brain and heart from further damage. It may save a  life. This may also be a scheduled procedure for irregular or fast heart rhythms that are not immediately life-threatening.  What can I expect after the procedure? Your blood pressure, heart rate, breathing rate, and blood oxygen level will be monitored until you leave the hospital or clinic. Your heart rhythm will be watched to make sure it does not change. You may have some redness on the skin where the shocks were given. Over the counter cortizone cream may be helpful.  Follow these instructions at home: Do not drive for 24 hours if you were given a sedative during your procedure. Take over-the-counter and prescription medicines only as told by your health care provider. Ask your health care provider how to check your pulse. Check it often. Rest for 48 hours after the procedure or as told by your health care provider. Avoid or limit your caffeine use as told by your health care provider. Keep all follow-up visits as told by your health care provider. This is important. Contact a health care provider if: You feel like your heart is beating too quickly or your pulse is not regular. You have a serious muscle cramp that does not go away. Get help right away if: You have discomfort in your chest. You are dizzy or you feel faint. You have trouble breathing or you are short of breath. Your speech is slurred. You have trouble moving an arm or leg on one side of your body. Your fingers or toes turn cold or blue. Summary Electrical cardioversion is the delivery of  a jolt of electricity to restore a normal rhythm to the heart. This procedure may be done right away in an emergency or may be a scheduled procedure if the condition is not an emergency. Generally, this is a safe procedure. After the procedure, check your pulse often as told by your health care provider. This information is not intended to replace advice given to you by your health care provider. Make sure you discuss any questions  you have with your health care provider. Document Revised: 09/11/2018 Document Reviewed: 09/11/2018 Elsevier Patient Education  [place].

## 2021-03-13 ENCOUNTER — Encounter (HOSPITAL_COMMUNITY): Payer: Self-pay | Admitting: Cardiology

## 2021-03-13 DIAGNOSIS — I10 Essential (primary) hypertension: Secondary | ICD-10-CM | POA: Diagnosis not present

## 2021-03-13 DIAGNOSIS — E119 Type 2 diabetes mellitus without complications: Secondary | ICD-10-CM | POA: Diagnosis not present

## 2021-03-16 DIAGNOSIS — Z992 Dependence on renal dialysis: Secondary | ICD-10-CM | POA: Diagnosis not present

## 2021-03-16 DIAGNOSIS — N186 End stage renal disease: Secondary | ICD-10-CM | POA: Diagnosis not present

## 2021-03-16 DIAGNOSIS — D631 Anemia in chronic kidney disease: Secondary | ICD-10-CM | POA: Diagnosis not present

## 2021-03-16 DIAGNOSIS — N2581 Secondary hyperparathyroidism of renal origin: Secondary | ICD-10-CM | POA: Diagnosis not present

## 2021-03-17 DIAGNOSIS — F330 Major depressive disorder, recurrent, mild: Secondary | ICD-10-CM | POA: Diagnosis not present

## 2021-03-18 DIAGNOSIS — J9601 Acute respiratory failure with hypoxia: Secondary | ICD-10-CM | POA: Diagnosis not present

## 2021-03-18 DIAGNOSIS — J449 Chronic obstructive pulmonary disease, unspecified: Secondary | ICD-10-CM | POA: Diagnosis not present

## 2021-03-18 DIAGNOSIS — E1169 Type 2 diabetes mellitus with other specified complication: Secondary | ICD-10-CM | POA: Diagnosis not present

## 2021-03-18 DIAGNOSIS — I959 Hypotension, unspecified: Secondary | ICD-10-CM | POA: Diagnosis not present

## 2021-03-22 DIAGNOSIS — U071 COVID-19: Secondary | ICD-10-CM | POA: Diagnosis not present

## 2021-03-23 DIAGNOSIS — N186 End stage renal disease: Secondary | ICD-10-CM | POA: Diagnosis not present

## 2021-03-23 DIAGNOSIS — D631 Anemia in chronic kidney disease: Secondary | ICD-10-CM | POA: Diagnosis not present

## 2021-03-23 DIAGNOSIS — N2581 Secondary hyperparathyroidism of renal origin: Secondary | ICD-10-CM | POA: Diagnosis not present

## 2021-03-23 DIAGNOSIS — Z992 Dependence on renal dialysis: Secondary | ICD-10-CM | POA: Diagnosis not present

## 2021-03-24 DIAGNOSIS — E1169 Type 2 diabetes mellitus with other specified complication: Secondary | ICD-10-CM | POA: Diagnosis not present

## 2021-03-24 DIAGNOSIS — N186 End stage renal disease: Secondary | ICD-10-CM | POA: Diagnosis not present

## 2021-03-24 DIAGNOSIS — Z992 Dependence on renal dialysis: Secondary | ICD-10-CM | POA: Diagnosis not present

## 2021-03-24 DIAGNOSIS — U071 COVID-19: Secondary | ICD-10-CM | POA: Diagnosis not present

## 2021-03-24 DIAGNOSIS — J9601 Acute respiratory failure with hypoxia: Secondary | ICD-10-CM | POA: Diagnosis not present

## 2021-03-24 DIAGNOSIS — J449 Chronic obstructive pulmonary disease, unspecified: Secondary | ICD-10-CM | POA: Diagnosis not present

## 2021-03-24 DIAGNOSIS — I129 Hypertensive chronic kidney disease with stage 1 through stage 4 chronic kidney disease, or unspecified chronic kidney disease: Secondary | ICD-10-CM | POA: Diagnosis not present

## 2021-03-25 DIAGNOSIS — Z992 Dependence on renal dialysis: Secondary | ICD-10-CM | POA: Diagnosis not present

## 2021-03-25 DIAGNOSIS — N2581 Secondary hyperparathyroidism of renal origin: Secondary | ICD-10-CM | POA: Diagnosis not present

## 2021-03-25 DIAGNOSIS — N186 End stage renal disease: Secondary | ICD-10-CM | POA: Diagnosis not present

## 2021-03-25 DIAGNOSIS — D631 Anemia in chronic kidney disease: Secondary | ICD-10-CM | POA: Diagnosis not present

## 2021-03-27 ENCOUNTER — Encounter (HOSPITAL_COMMUNITY): Payer: HMO | Admitting: Cardiology

## 2021-03-30 DIAGNOSIS — N186 End stage renal disease: Secondary | ICD-10-CM | POA: Diagnosis not present

## 2021-03-30 DIAGNOSIS — L299 Pruritus, unspecified: Secondary | ICD-10-CM | POA: Diagnosis not present

## 2021-03-30 DIAGNOSIS — T7840XA Allergy, unspecified, initial encounter: Secondary | ICD-10-CM | POA: Diagnosis not present

## 2021-03-30 DIAGNOSIS — D631 Anemia in chronic kidney disease: Secondary | ICD-10-CM | POA: Diagnosis not present

## 2021-03-30 DIAGNOSIS — N2581 Secondary hyperparathyroidism of renal origin: Secondary | ICD-10-CM | POA: Diagnosis not present

## 2021-03-30 DIAGNOSIS — Z992 Dependence on renal dialysis: Secondary | ICD-10-CM | POA: Diagnosis not present

## 2021-03-31 DIAGNOSIS — I4891 Unspecified atrial fibrillation: Secondary | ICD-10-CM | POA: Diagnosis not present

## 2021-03-31 DIAGNOSIS — J449 Chronic obstructive pulmonary disease, unspecified: Secondary | ICD-10-CM | POA: Diagnosis not present

## 2021-03-31 DIAGNOSIS — L298 Other pruritus: Secondary | ICD-10-CM | POA: Diagnosis not present

## 2021-03-31 DIAGNOSIS — I959 Hypotension, unspecified: Secondary | ICD-10-CM | POA: Diagnosis not present

## 2021-03-31 DIAGNOSIS — E1169 Type 2 diabetes mellitus with other specified complication: Secondary | ICD-10-CM | POA: Diagnosis not present

## 2021-03-31 DIAGNOSIS — N186 End stage renal disease: Secondary | ICD-10-CM | POA: Diagnosis not present

## 2021-04-04 DIAGNOSIS — F330 Major depressive disorder, recurrent, mild: Secondary | ICD-10-CM | POA: Diagnosis not present

## 2021-04-06 DIAGNOSIS — D631 Anemia in chronic kidney disease: Secondary | ICD-10-CM | POA: Diagnosis not present

## 2021-04-06 DIAGNOSIS — L299 Pruritus, unspecified: Secondary | ICD-10-CM | POA: Diagnosis not present

## 2021-04-06 DIAGNOSIS — F330 Major depressive disorder, recurrent, mild: Secondary | ICD-10-CM | POA: Diagnosis not present

## 2021-04-06 DIAGNOSIS — F5101 Primary insomnia: Secondary | ICD-10-CM | POA: Diagnosis not present

## 2021-04-06 DIAGNOSIS — N186 End stage renal disease: Secondary | ICD-10-CM | POA: Diagnosis not present

## 2021-04-06 DIAGNOSIS — Z992 Dependence on renal dialysis: Secondary | ICD-10-CM | POA: Diagnosis not present

## 2021-04-06 DIAGNOSIS — T7840XA Allergy, unspecified, initial encounter: Secondary | ICD-10-CM | POA: Diagnosis not present

## 2021-04-06 DIAGNOSIS — D509 Iron deficiency anemia, unspecified: Secondary | ICD-10-CM | POA: Diagnosis not present

## 2021-04-06 DIAGNOSIS — N2581 Secondary hyperparathyroidism of renal origin: Secondary | ICD-10-CM | POA: Diagnosis not present

## 2021-04-08 ENCOUNTER — Other Ambulatory Visit (HOSPITAL_COMMUNITY): Payer: Self-pay | Admitting: Cardiology

## 2021-04-08 DIAGNOSIS — M6258 Muscle wasting and atrophy, not elsewhere classified, other site: Secondary | ICD-10-CM | POA: Diagnosis not present

## 2021-04-08 DIAGNOSIS — I4891 Unspecified atrial fibrillation: Secondary | ICD-10-CM | POA: Diagnosis not present

## 2021-04-08 DIAGNOSIS — I509 Heart failure, unspecified: Secondary | ICD-10-CM | POA: Diagnosis not present

## 2021-04-08 DIAGNOSIS — E669 Obesity, unspecified: Secondary | ICD-10-CM | POA: Diagnosis not present

## 2021-04-08 DIAGNOSIS — M5459 Other low back pain: Secondary | ICD-10-CM | POA: Diagnosis not present

## 2021-04-08 DIAGNOSIS — E119 Type 2 diabetes mellitus without complications: Secondary | ICD-10-CM | POA: Diagnosis not present

## 2021-04-08 DIAGNOSIS — M6281 Muscle weakness (generalized): Secondary | ICD-10-CM | POA: Diagnosis not present

## 2021-04-08 DIAGNOSIS — M159 Polyosteoarthritis, unspecified: Secondary | ICD-10-CM | POA: Diagnosis not present

## 2021-04-08 DIAGNOSIS — N186 End stage renal disease: Secondary | ICD-10-CM | POA: Diagnosis not present

## 2021-04-08 DIAGNOSIS — R41841 Cognitive communication deficit: Secondary | ICD-10-CM | POA: Diagnosis not present

## 2021-04-08 NOTE — Progress Notes (Signed)
Advanced Heart Failure Clinic Note  PCP: Alroy Dust, L.Marlou Sa, MD HF Cardiology: Dr. Aundra Dubin Nephrology: Dr Hollie Salk.   81 y.o.with history of ESRD, paroxysmal atrial fibrillation, and chronic diastolic CHF.  RHC/LHC was done in 10/15.  This showed markedly elevated filling pressures and pulmonary venous hypertension.  There was no significant CAD.  Last echo in 2/18 showed EF 50-55%, mild LVH, grade 2 diastolic dysfunction.   Admitted 10/2-10/6/19 with SOB. RHC showed elevated filling pressures and preserved CO. She was diuresed with IV lasix, then transitioned to torsemide 80 mg am, 40 mg pm. Repeat echo showed EF 45-50% with normal RV. PYP scan was negative for TTR amyloid. PFTs showed restrictive disease. CT chest showed no ILD but did show right hydronephrosis. Renal US showed single kidney with mild to moderate hydronephrosis on right (unchanged since 2011).  DC weight 208 lbs.    In 12/20, she progressed to ESRD and was started on HD.    Echo in 1/21 showed EF 55-60% with normal.  RHC/LHC in 4/21 showed no obstructive CAD, normal filling pressures, mild pulmonary hypertension.  She had atrial fibrillation in 9/21 and underwent DCCV to NSR.   4/22 was seen in ED for acute dyspnea. Found to be in rapid Afib. This was in setting of recent consumption of high caffeine energy drink. She was given IV Cardizem and converted back to NSR and was released same day.  Admitted 12/22 with atrial fibrillation with RVR. She was cardioverted in ED to SR with PACs. She was started on amio and Toprol. Unfortunately she was found unresponsive, intubated and transferred to ICU on NE. Code stroke activated. MRI brain without acute process. 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  She required CVVH but was able to transition to [facility] after extubation. Echo showed EF 30-35% range, worse function in septum; mild RV dysfunction, IVC dilated. She underwent LHC  showing no obstructive CAD. 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 She went back into afib requiring IV amiodarone and had successful DCCV 03/03/21.  At post hospital visit on 02/28/21, back in A fib. On 03/12/21 she underwent successful cardioversion.   Today she returns for HF follow up with her daughter. Her goal is to return home. Overall feeling fine. SOB with ADLs.  Denies PND/Orthopnea. Walked 50 feet with therapy. Continues to uses a walker. Appetite ok. No fever or chills. Weight at the SNF has been stable. Taking all medications.Currently at [facility].   Labs (2/18): K 4, creatinine 1.79 Labs (10/19): K 4.5, creatinine 2.4 Labs (12/19): LDL 62 Labs (1/20): K 3.9, creatinine 2.72 Labs (12/20): hgb 11.9 Labs (9/21): LDL 64, HDL 53 Labs (4/21): SCr 4.19, K 3.8  Labs (1/23): K 3.6, creatinine 4.89, hgb 10.5  PMH: 1. Atrial fibrillation: Paroxysmal.  2. HTN 3. 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  5. ESRD 6. H/o IVCD 7. COPD: PFTs (10/19) actually showed severe restriction, concern for interstitial process.  CT chest (10/19) showed mild-moderate patchy air trapping but no evidence for interstitial lung disease.  8. Gout 9. Chronic primarily diastolic CHF: Echo (9/70) with EF 50-55%, mild LVH, grade 2 diastolic dysfunction.  - LHC/RHC (10/15): small distal LAD but no discrete stenosis.  Mean RA 15, PA 55/20 mean 34, mean PCWP 31 => pulmonary venous hypertension.  - PYP scan (10/19): No evidence for  TTR amyloidosis.  - Echo (10/19): EF 45-50%, mild LVH, normal RV size and systolic function.  - RHC (10/19): mean RA 13, PA 74/27 mean 50, mean PCWP 28, CI 3.12, PVR 3.6 WU. Pulmonary venous hypertension.  - Echo (1/21): EF 55-60%, normal RV.  - LHC/RHC (4/21): super-dominant LCx with small LAD and RCA, no CAD; mean RA 5,  PA 47/15, mean PCWP 10, CI 3.61, PVR 2.5 WU.  - Echo (1/23): EF 30-35% range, worse function in septum; mild RV dysfunction, IVC dilated.  - LHC (1/23): no obstructive CAD. 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   10. Negative sleep study in 6/21.   Review of systems complete and found to be negative unless listed in HPI.    Social History   Socioeconomic History   Marital status: Widowed    Spouse name: Not on file   Number of children: 2   Years of education: Not on file   Highest education level: Not on file  Occupational History   Occupation: retired  Tobacco Use   Smoking status: Former    Packs/day: 1.00    Years: 20.00    Pack years: 20.00    Types: Cigarettes    Quit date: 02/22/2009    Years since quitting: 12.1   Smokeless tobacco: Never  Vaping Use   Vaping Use: Never used  Substance and Sexual Activity   Alcohol use: Yes    Alcohol/week: 0.0 standard drinks    Comment: occasional beer   Drug use: Not Currently    Comment: marijuana in the past   Sexual activity: Never  Other Topics Concern   Not on file  Social History Narrative   Not on file   Social Determinants of Health   Financial Resource Strain: Low Risk    Difficulty of Paying Living Expenses: Not very hard  Food Insecurity: No Food Insecurity   Worried About Charity fundraiser in the Last Year: Never true   Ran Out of Food in the Last Year: Never true  Transportation Needs: No Transportation Needs   Lack of Transportation (Medical): No   Lack of Transportation (Non-Medical): No  Physical Activity: Not on file  Stress: Not on file  Social Connections: Not on file  Intimate Partner Violence: Not on file   Family History  Problem Relation Age of Onset   Lupus Daughter    Cancer Mother 49       type unknown   CVA Father 15   Cancer Brother 97       type unknown   Review of systems complete and found to be negative unless  listed in HPI.    Current Outpatient Medications  Medication Sig Dispense Refill   acetaminophen (TYLENOL) 500 MG tablet Take 500 mg by mouth every 6 (six) hours as needed (pain).     albuterol (PROVENTIL HFA;VENTOLIN HFA) 108 (90 BASE) MCG/ACT inhaler Inhale 2 puffs into the lungs every 4 (four) hours as needed for shortness of breath.     amiodarone (PACERONE) 200 MG tablet Take 200 mg by mouth daily.     atorvastatin (LIPITOR) 80 MG tablet Take 80 mg by mouth at bedtime.     B Complex-C-Zn-Folic Acid (DIALYVITE 956-LOVF 15) 0.8 MG TABS Take 1 tablet by mouth in the morning.     Blood Glucose Monitoring Suppl (FREESTYLE LITE) DEVI Use as instructed to check blood sugar 4 times daily (Patient taking differently: 1  each by Other route See admin instructions. Use as instructed to check blood sugar 4 times daily) 1 kit 0   budesonide-formoterol (SYMBICORT) 160-4.5 MCG/ACT inhaler Inhale 2 puffs into the lungs 2 (two) times daily. 1 each 0   Continuous Blood Gluc Receiver (FREESTYLE LIBRE READER) DEVI 1 kit by Does not apply route daily. Use as instructed E11.65 1 each 0   ELIQUIS 5 MG TABS tablet Take 1 tablet by mouth twice daily (Patient taking differently: Take 5 mg by mouth 2 (two) times daily.) 180 tablet 3   glucose blood (FREESTYLE LITE) test strip USE AS DIRECTED 4 TIMES DAILY (Patient taking differently: 1 each by Other route See admin instructions. USE AS DIRECTED 4 TIMES DAILY) 100 each 0   insulin aspart (NOVOLOG) 100 UNIT/ML injection CBG 70 - 120: 0 units CBG 121 - 150: 1 unit CBG 151 - 200: 2 units CBG 201 - 250: 3 units CBG 251 - 300: 5 units CBG 301 - 350: 7 units CBG 351 - 400: 9 units 10 mL 11   insulin degludec (TRESIBA FLEXTOUCH) 100 UNIT/ML FlexTouch Pen Inject 18 Units into the skin daily. 15 mL 6   Insulin Pen Needle 32G X 4 MM MISC 1 Device by Does not apply route in the morning, at noon, in the evening, and at bedtime. 400 each 3   ketoconazole (NIZORAL) 2 % cream Apply 1  application topically every morning.     ketoconazole (NIZORAL) 2 % shampoo Apply 1 application topically 2 (two) times a week. Mondays & Thursdays     latanoprost (XALATAN) 0.005 % ophthalmic solution Place 1 drop into both eyes at bedtime.     lidocaine (LIDODERM) 5 % Place 1 patch onto the skin daily.     lidocaine (LINDAMANTLE) 3 % CREA cream Apply 1 application topically every Monday, Wednesday, and Friday.     midodrine (PROAMATINE) 10 MG tablet Take 1 tablet (10 mg total) by mouth 3 (three) times daily with meals. 90 tablet 3   mirtazapine (REMERON) 15 MG tablet Take 15 mg by mouth at bedtime.     multivitamin (RENA-VIT) TABS tablet Take 1 tablet by mouth at bedtime.  0   Nutritional Supplements (FEEDING SUPPLEMENT, NEPRO CARB STEADY,) LIQD Take 237 mLs by mouth 3 (three) times daily with meals. 21330 mL 2   ondansetron (ZOFRAN) 4 MG tablet Take 4 mg by mouth 2 (two) times daily as needed for nausea.      pantoprazole (PROTONIX) 40 MG tablet Take 40 mg by mouth every evening.      QUEtiapine (SEROQUEL) 25 MG tablet Take 1 tablet (25 mg total) by mouth at bedtime. 30 tablet 0   senna-docusate (SENOKOT-S) 8.6-50 MG tablet Take 1 tablet by mouth at bedtime as needed for mild constipation. (Patient not taking: Reported on 03/10/2021)     Skin Protectants, Misc. (MINERIN CREME) CREA Apply 1 application topically daily.     sucroferric oxyhydroxide (VELPHORO) 500 MG chewable tablet Chew 500 mg by mouth See admin instructions. Crush or chew or swallow 1 tablet three times a day with meals and 1 tablet two times a day with snacks.     No current facility-administered medications for this encounter.   BP (!) 162/76    Pulse 65    Wt 87.1 kg (192 lb)    SpO2 93%    BMI 35.12 kg/m   Body mass index is 35.12 kg/m. Wt Readings from Last 3 Encounters:  04/09/21 87.1 kg (192  lb)  03/12/21 87.7 kg (193 lb 6.4 oz)  03/10/21 87.7 kg (193 lb 6.4 oz)   PHYSICAL EXAM: General:  Arrived in a wheelchair.   No resp difficulty HEENT: normal Neck: supple. no JVD. Carotids 2+ bilat; no bruits. No lymphadenopathy or thryomegaly appreciated. Cor: PMI nondisplaced. Regular rate & rhythm. No rubs, gallops or murmurs. Lungs: clear Abdomen: soft, nontender, nondistended. No hepatosplenomegaly. No bruits or masses. Good bowel sounds. Extremities: no cyanosis, clubbing, rash, edema Neuro: alert & orientedx3, cranial nerves grossly intact. moves all 4 extremities w/o difficulty. Affect pleasant  EKG: Sinus Brady 53 bpm   Assessment/Plan: 1.  Atrial fibrillation: Known hx paroxysmal AF.  Se[facility] ED at [facility] 12/22 for AF and spontaneously converted to [place].  Presented 02/17/21 with AF with RVR. Cardioverted in ER.  She was in junctional rhythm after respiratory arrest and intubation with rate upper 40s-50s. Rhythm on 1/3 appeared to be wandering atrial pacemaker, and amiodarone stopped.  Reverted back to AF with RVR and amiodarone restarted. S/Pp DCCV 1/10 and 03/12/21  - Keep amiodarone 200 mg bid for now.  - Amio screening. Next visit check LFTS, TSH free T3 free T4. Discussed yearly eye exam and cxr or PFTs.  - Continue Eliquis 5 mg bid. No bleeding issues.  2. Chronic systolic CHF: Echo in 1/66 with EF 50-55%, mild LVH, grade 2 diastolic dysfunction.  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  [place] 11/23/17 showed significantly elevated right and left heart filling pressures and severe primarily pulmonary venous hypertension. Cardiac output preserved. Echo (10/19) with EF 45-50%, septal-lateral dyssynchrony, RV looked ok.  PYP scan was not suggestive of transthyretin amyloidosis.  Echo in 1/21 with EF 55-60%, normal RV.  RHC/LHC in 4/21 with no significant CAD, normal filling pressures and cardiac output.  Echo 1/23 showed EF down to 35%.  HS-TnI 385 => 378, likely demand ischemia with hypotension/respiratory arrest.  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 Repeat ECHO next visit.   -NYHA II-III. Volume management with iHD.  - Continue midodrine 10 mg tid. Will need to verify she is getting midodrine 10 mg three times a day. -No BP room for GDMT.  - Would like eventual coronary CT to make sure we are not missing an anomalous vessel with unusual coronary tree on cath.  3. ESRD: Followed by Dr. Hollie Salk.  - Tolerating iHD. Management per nephrology  - Continue midodrine to 10 mg tid.  4. Pulmonary HTN: Mild PH on 4/21 RHC.  5. Hyperlipidemia: Continue statin.  6. DM II: A1c 9.1. She is on insulin  EKG today. Maintaining SR Follow up in 8 weeks with Dr Aundra Dubin and an ECHO.   Karina Nofsinger NP-C   04/09/21

## 2021-04-09 ENCOUNTER — Encounter (HOSPITAL_COMMUNITY): Payer: Self-pay

## 2021-04-09 ENCOUNTER — Other Ambulatory Visit: Payer: Self-pay

## 2021-04-09 ENCOUNTER — Ambulatory Visit (HOSPITAL_COMMUNITY)
Admission: RE | Admit: 2021-04-09 | Discharge: 2021-04-09 | Disposition: A | Discharge status: Home / Self Care | Payer: HMO | Source: Ambulatory Visit | Attending: Cardiology | Admitting: Cardiology

## 2021-04-09 VITALS — BP 162/76 | HR 65 | Wt 192.0 lb

## 2021-04-09 DIAGNOSIS — Z992 Dependence on renal dialysis: Secondary | ICD-10-CM | POA: Diagnosis not present

## 2021-04-09 DIAGNOSIS — I429 Cardiomyopathy, unspecified: Secondary | ICD-10-CM | POA: Diagnosis not present

## 2021-04-09 DIAGNOSIS — N1330 Unspecified hydronephrosis: Secondary | ICD-10-CM | POA: Insufficient documentation

## 2021-04-09 DIAGNOSIS — E785 Hyperlipidemia, unspecified: Secondary | ICD-10-CM | POA: Diagnosis not present

## 2021-04-09 DIAGNOSIS — I6523 Occlusion and stenosis of bilateral carotid arteries: Secondary | ICD-10-CM | POA: Insufficient documentation

## 2021-04-09 DIAGNOSIS — I2510 Atherosclerotic heart disease of native coronary artery without angina pectoris: Secondary | ICD-10-CM | POA: Insufficient documentation

## 2021-04-09 DIAGNOSIS — I132 Hypertensive heart and chronic kidney disease with heart failure and with stage 5 chronic kidney disease, or end stage renal disease: Secondary | ICD-10-CM | POA: Diagnosis not present

## 2021-04-09 DIAGNOSIS — Z7901 Long term (current) use of anticoagulants: Secondary | ICD-10-CM | POA: Diagnosis not present

## 2021-04-09 DIAGNOSIS — Z794 Long term (current) use of insulin: Secondary | ICD-10-CM

## 2021-04-09 DIAGNOSIS — E1122 Type 2 diabetes mellitus with diabetic chronic kidney disease: Secondary | ICD-10-CM | POA: Diagnosis not present

## 2021-04-09 DIAGNOSIS — I491 Atrial premature depolarization: Secondary | ICD-10-CM | POA: Insufficient documentation

## 2021-04-09 DIAGNOSIS — I482 Chronic atrial fibrillation, unspecified: Secondary | ICD-10-CM | POA: Diagnosis not present

## 2021-04-09 DIAGNOSIS — I440 Atrioventricular block, first degree: Secondary | ICD-10-CM | POA: Diagnosis not present

## 2021-04-09 DIAGNOSIS — I454 Nonspecific intraventricular block: Secondary | ICD-10-CM | POA: Insufficient documentation

## 2021-04-09 DIAGNOSIS — I5042 Chronic combined systolic (congestive) and diastolic (congestive) heart failure: Secondary | ICD-10-CM | POA: Insufficient documentation

## 2021-04-09 DIAGNOSIS — N186 End stage renal disease: Secondary | ICD-10-CM

## 2021-04-09 DIAGNOSIS — F330 Major depressive disorder, recurrent, mild: Secondary | ICD-10-CM | POA: Diagnosis not present

## 2021-04-09 DIAGNOSIS — I272 Pulmonary hypertension, unspecified: Secondary | ICD-10-CM | POA: Diagnosis not present

## 2021-04-09 DIAGNOSIS — I480 Paroxysmal atrial fibrillation: Secondary | ICD-10-CM | POA: Diagnosis not present

## 2021-04-09 DIAGNOSIS — F5101 Primary insomnia: Secondary | ICD-10-CM | POA: Diagnosis not present

## 2021-04-09 DIAGNOSIS — Z79899 Other long term (current) drug therapy: Secondary | ICD-10-CM | POA: Insufficient documentation

## 2021-04-09 DIAGNOSIS — I5022 Chronic systolic (congestive) heart failure: Secondary | ICD-10-CM | POA: Diagnosis not present

## 2021-04-09 NOTE — Patient Instructions (Signed)
It was great to see you today! No medication changes are needed at this time.   Labs today We will only contact you if something comes back abnormal or we need to make some changes. Otherwise no news is good news!  Your physician recommends that you schedule a follow-up appointment in: 8 weeks with Dr Aundra Dubin and echo  Your physician has requested that you have an echocardiogram. Echocardiography is a painless test that uses sound waves to create images of your heart. It provides your doctor with information about the size and shape of your heart and how well your hearts chambers and valves are working. This procedure takes approximately one hour. There are no restrictions for this procedure.  Do the following things EVERYDAY: Weigh yourself in the morning before breakfast. Write it down and keep it in a log. Take your medicines as prescribed Eat low salt foods--Limit salt (sodium) to 2000 mg per day.  Stay as active as you can everyday Limit all fluids for the day to less than 2 liters  At the [facility], you and your health needs are our priority. As part of our continuing mission to provide you with exceptional heart care, we have created designated Provider Care Teams. These Care Teams include your primary Cardiologist (physician) and Advanced Practice Providers (APPs- Physician Assistants and Nurse Practitioners) who all work together to provide you with the care you need, when you need it.   You may see any of the following providers on your designated Care Team at your next follow up: Dr Glori Bickers Dr Haynes Kerns, NP Lyda Jester, [place] [facility] [place], [place] Audry Riles, PharmD   Please be sure to bring in all your medications bottles to every appointment.

## 2021-04-13 DIAGNOSIS — N2581 Secondary hyperparathyroidism of renal origin: Secondary | ICD-10-CM | POA: Diagnosis not present

## 2021-04-13 DIAGNOSIS — N186 End stage renal disease: Secondary | ICD-10-CM | POA: Diagnosis not present

## 2021-04-13 DIAGNOSIS — Z992 Dependence on renal dialysis: Secondary | ICD-10-CM | POA: Diagnosis not present

## 2021-04-13 DIAGNOSIS — L299 Pruritus, unspecified: Secondary | ICD-10-CM | POA: Diagnosis not present

## 2021-04-13 DIAGNOSIS — D631 Anemia in chronic kidney disease: Secondary | ICD-10-CM | POA: Diagnosis not present

## 2021-04-17 DIAGNOSIS — F330 Major depressive disorder, recurrent, mild: Secondary | ICD-10-CM | POA: Diagnosis not present

## 2021-04-20 ENCOUNTER — Emergency Department (HOSPITAL_COMMUNITY)
Admission: EM | Admit: 2021-04-20 | Discharge: 2021-04-21 | Disposition: A | Discharge status: Skilled Nursing Facility | Payer: HMO | Attending: Emergency Medicine | Admitting: Emergency Medicine

## 2021-04-20 ENCOUNTER — Emergency Department (HOSPITAL_COMMUNITY): Payer: HMO

## 2021-04-20 DIAGNOSIS — R739 Hyperglycemia, unspecified: Secondary | ICD-10-CM | POA: Diagnosis not present

## 2021-04-20 DIAGNOSIS — M159 Polyosteoarthritis, unspecified: Secondary | ICD-10-CM | POA: Diagnosis not present

## 2021-04-20 DIAGNOSIS — S0083XA Contusion of other part of head, initial encounter: Secondary | ICD-10-CM | POA: Diagnosis not present

## 2021-04-20 DIAGNOSIS — S0003XA Contusion of scalp, initial encounter: Secondary | ICD-10-CM | POA: Diagnosis not present

## 2021-04-20 DIAGNOSIS — M5459 Other low back pain: Secondary | ICD-10-CM | POA: Diagnosis not present

## 2021-04-20 DIAGNOSIS — L299 Pruritus, unspecified: Secondary | ICD-10-CM | POA: Diagnosis not present

## 2021-04-20 DIAGNOSIS — Y92002 Bathroom of unspecified non-institutional (private) residence single-family (private) house as the place of occurrence of the external cause: Secondary | ICD-10-CM | POA: Diagnosis not present

## 2021-04-20 DIAGNOSIS — Z992 Dependence on renal dialysis: Secondary | ICD-10-CM | POA: Insufficient documentation

## 2021-04-20 DIAGNOSIS — I132 Hypertensive heart and chronic kidney disease with heart failure and with stage 5 chronic kidney disease, or end stage renal disease: Secondary | ICD-10-CM | POA: Diagnosis not present

## 2021-04-20 DIAGNOSIS — R519 Headache, unspecified: Secondary | ICD-10-CM | POA: Diagnosis not present

## 2021-04-20 DIAGNOSIS — M25531 Pain in right wrist: Secondary | ICD-10-CM | POA: Diagnosis not present

## 2021-04-20 DIAGNOSIS — M25562 Pain in left knee: Secondary | ICD-10-CM | POA: Diagnosis not present

## 2021-04-20 DIAGNOSIS — Z794 Long term (current) use of insulin: Secondary | ICD-10-CM | POA: Insufficient documentation

## 2021-04-20 DIAGNOSIS — M6281 Muscle weakness (generalized): Secondary | ICD-10-CM | POA: Diagnosis not present

## 2021-04-20 DIAGNOSIS — W050XXA Fall from non-moving wheelchair, initial encounter: Secondary | ICD-10-CM | POA: Insufficient documentation

## 2021-04-20 DIAGNOSIS — M25561 Pain in right knee: Secondary | ICD-10-CM

## 2021-04-20 DIAGNOSIS — W19XXXA Unspecified fall, initial encounter: Secondary | ICD-10-CM

## 2021-04-20 DIAGNOSIS — N2581 Secondary hyperparathyroidism of renal origin: Secondary | ICD-10-CM | POA: Diagnosis not present

## 2021-04-20 DIAGNOSIS — J449 Chronic obstructive pulmonary disease, unspecified: Secondary | ICD-10-CM | POA: Diagnosis not present

## 2021-04-20 DIAGNOSIS — N186 End stage renal disease: Secondary | ICD-10-CM | POA: Insufficient documentation

## 2021-04-20 DIAGNOSIS — M25532 Pain in left wrist: Secondary | ICD-10-CM

## 2021-04-20 DIAGNOSIS — Z7901 Long term (current) use of anticoagulants: Secondary | ICD-10-CM | POA: Diagnosis not present

## 2021-04-20 DIAGNOSIS — Z7951 Long term (current) use of inhaled steroids: Secondary | ICD-10-CM | POA: Diagnosis not present

## 2021-04-20 DIAGNOSIS — I4891 Unspecified atrial fibrillation: Secondary | ICD-10-CM | POA: Diagnosis not present

## 2021-04-20 DIAGNOSIS — F330 Major depressive disorder, recurrent, mild: Secondary | ICD-10-CM | POA: Diagnosis not present

## 2021-04-20 DIAGNOSIS — E1122 Type 2 diabetes mellitus with diabetic chronic kidney disease: Secondary | ICD-10-CM | POA: Insufficient documentation

## 2021-04-20 DIAGNOSIS — Z043 Encounter for examination and observation following other accident: Secondary | ICD-10-CM | POA: Diagnosis not present

## 2021-04-20 DIAGNOSIS — E669 Obesity, unspecified: Secondary | ICD-10-CM | POA: Diagnosis not present

## 2021-04-20 DIAGNOSIS — D631 Anemia in chronic kidney disease: Secondary | ICD-10-CM | POA: Diagnosis not present

## 2021-04-20 DIAGNOSIS — E119 Type 2 diabetes mellitus without complications: Secondary | ICD-10-CM | POA: Diagnosis not present

## 2021-04-20 DIAGNOSIS — I509 Heart failure, unspecified: Secondary | ICD-10-CM | POA: Diagnosis not present

## 2021-04-20 DIAGNOSIS — S0990XA Unspecified injury of head, initial encounter: Secondary | ICD-10-CM | POA: Diagnosis not present

## 2021-04-20 DIAGNOSIS — F5101 Primary insomnia: Secondary | ICD-10-CM | POA: Diagnosis not present

## 2021-04-20 DIAGNOSIS — S80919A Unspecified superficial injury of unspecified knee, initial encounter: Secondary | ICD-10-CM | POA: Diagnosis not present

## 2021-04-20 DIAGNOSIS — M25552 Pain in left hip: Secondary | ICD-10-CM | POA: Diagnosis not present

## 2021-04-20 NOTE — Progress Notes (Signed)
Orthopedic Tech Progress Note Patient Details:  Stephanie Woodward 1940/06/08 [id number]  Patient ID: Stephanie Woodward, female   DOB: September 25, 1940, 81 y.o.   MRN: [id number] I attended trauma page Karolee Stamps 04/20/2021, 11:09 PM

## 2021-04-20 NOTE — ED Notes (Addendum)
ED provider at bedside to evaluate patient. Patient answering all questions appropriately. GCS remains 15 at this time.

## 2021-04-20 NOTE — ED Notes (Signed)
Patient placed on 2 liters of oxygen via nasal cannula at this time to maintain spo2 above 90%. Patient tolerating well, spo2 maintains at 99% on 2 liters.

## 2021-04-20 NOTE — ED Notes (Signed)
Patient transported to CT w/ Stephanie Woodward.

## 2021-04-20 NOTE — ED Provider Notes (Signed)
[facility] EMERGENCY DEPARTMENT Provider Note   CSN: [id number] Arrival date & time: 04/20/21  2140     History  Chief Complaint  Patient presents with   Fall    Fall out of wheel chair around 750 pm tonight. Patient hit head, patient on eliquis and hematoma above left eye. Patient dialysis patient and received dialysis today. GCS 15 upon arrival. BP 186 palpated, HR 78 spo2 94. BG 295.     Stephanie Woodward is a 81 y.o. female.  HPI Patient is a 81 year old female with a history of type 2 diabetes mellitus, CHF, pulmonary hypertension, ESRD on HD Monday/Wednesday/Friday, COPD, PAF on Eliquis, who presents to the emergency department due to a fall that occurred this evening.  Patient states that she was in her wheelchair trying to get into the bathroom.  She states that there is a small ridge on the floor between her room and the bathroom and she was having difficulty getting the wheel over it and when backing up the wheelchair fell forward and landed on the floor.  She states that she struck her forehead and left arm on the floor.  She also believes that she landed on her knees.  She reports hematoma to the forehead, pain in the left wrist, as well as pain in the bilateral knees.  Denies any LOC.  Confirms that she is anticoagulated on Eliquis.  States that she had a normal dialysis session earlier today.    Home Medications Prior to Admission medications   Medication Sig Start Date End Date Taking? Authorizing Provider  acetaminophen (TYLENOL) 500 MG tablet Take 500 mg by mouth every 6 (six) hours as needed (pain).    [provider]  albuterol (PROVENTIL HFA;VENTOLIN HFA) 108 (90 BASE) MCG/ACT inhaler Inhale 2 puffs into the lungs every 4 (four) hours as needed for shortness of breath.    [provider]  amiodarone (PACERONE) 200 MG tablet Take 200 mg by mouth daily.    [provider]  atorvastatin (LIPITOR) 80 MG tablet Take 80 mg by mouth  at bedtime.    [provider]  B Complex-C-Zn-Folic Acid (DIALYVITE 696-VELF 15) 0.8 MG TABS Take 1 tablet by mouth in the morning. Patient not taking: Reported on 04/09/2021 03/07/19   [provider]  Blood Glucose Monitoring Suppl (FREESTYLE LITE) DEVI Use as instructed to check blood sugar 4 times daily Patient taking differently: 1 each by Other route See admin instructions. Use as instructed to check blood sugar 4 times daily 12/29/20   Shamleffer, Melanie Crazier, MD  budesonide-formoterol [facility]) 160-4.5 MCG/ACT inhaler Inhale 2 puffs into the lungs 2 (two) times daily. 04/09/20   Collene Gobble, MD  Continuous Blood Gluc Receiver (FREESTYLE LIBRE READER) DEVI 1 kit by Does not apply route daily. Use as instructed E11.65 09/12/19   Shamleffer, Melanie Crazier, MD  ELIQUIS 5 MG TABS tablet Take 1 tablet by mouth twice daily 07/23/20   Larey Dresser, MD  glucose blood (FREESTYLE LITE) test strip USE AS DIRECTED 4 TIMES DAILY Patient taking differently: 1 each by Other route See admin instructions. USE AS DIRECTED 4 TIMES DAILY 02/02/21   Shamleffer, Melanie Crazier, MD  insulin aspart (NOVOLOG) 100 UNIT/ML injection CBG 70 - 120: 0 units CBG 121 - 150: 1 unit CBG 151 - 200: 2 units CBG 201 - 250: 3 units CBG 251 - 300: 5 units CBG 301 - 350: 7 units CBG 351 - 400: 9 units 03/05/21  Hosie Poisson, MD  insulin degludec (TRESIBA FLEXTOUCH) 100 UNIT/ML FlexTouch Pen Inject 18 Units into the skin daily. Patient taking differently: Inject 23 Units into the skin daily. 02/06/20   Shamleffer, Melanie Crazier, MD  Insulin Pen Needle 32G X 4 MM MISC 1 Device by Does not apply route in the morning, at noon, in the evening, and at bedtime. 02/06/20   Shamleffer, Melanie Crazier, MD  ketoconazole (NIZORAL) 2 % cream Apply 1 application topically every morning. 02/16/21   [provider]  ketoconazole (NIZORAL) 2 % shampoo Apply 1 application topically 2 (two) times a week.  Mondays & Thursdays 02/16/21   [provider]  latanoprost (XALATAN) 0.005 % ophthalmic solution Place 1 drop into both eyes at bedtime.    [provider]  lidocaine (LIDODERM) 5 % Place 4 patches onto the skin daily. 11/12/20   [provider]  lidocaine (LINDAMANTLE) 3 % CREA cream Apply 1 application topically every Monday, Wednesday, and Friday. 03/06/20   [provider]  midodrine (PROAMATINE) 5 MG tablet Take 5 mg by mouth 3 (three) times daily with meals.    [provider]  mirtazapine (REMERON) 15 MG tablet Take 15 mg by mouth at bedtime.    [provider]  multivitamin (RENA-VIT) TABS tablet Take 1 tablet by mouth at bedtime. 03/05/21   Hosie Poisson, MD  Nutritional Supplements (FEEDING SUPPLEMENT, NEPRO CARB STEADY,) LIQD Take 237 mLs by mouth 3 (three) times daily with meals. 03/05/21 06/03/21  Hosie Poisson, MD  ondansetron (ZOFRAN) 4 MG tablet Take 4 mg by mouth 2 (two) times daily as needed for nausea.  10/26/18   [provider]  pantoprazole (PROTONIX) 40 MG tablet Take 40 mg by mouth every evening.  10/26/18   [provider]  senna-docusate (SENOKOT-S) 8.6-50 MG tablet Take 1 tablet by mouth at bedtime as needed for mild constipation. 03/05/21   Hosie Poisson, MD  Skin Protectants, Misc. (MINERIN CREME) CREA Apply 1 application topically daily.    [provider]  sucroferric oxyhydroxide (VELPHORO) 500 MG chewable tablet Chew 500 mg by mouth See admin instructions. Crush or chew or swallow 1 tablet three times a day with meals and 1 tablet two times a day with snacks.    [provider]      Allergies    Eggs or egg-derived products, Lisinopril, Penicillins, and Other    Review of Systems   Review of Systems  All other systems reviewed and are negative. Ten systems reviewed and are negative for acute change, except as noted in the HPI.   Physical Exam Updated Vital Signs BP (!) 133/51     Pulse 65    Temp 98.7 F (37.1 C)    Resp 17    Ht 5' 2" (1.575 m)    Wt 85.3 kg    SpO2 100%    BMI 34.39 kg/m  Physical Exam Vitals and nursing note reviewed.  Constitutional:      General: She is not in acute distress.    Appearance: Normal appearance. She is not ill-appearing, toxic-appearing or diaphoretic.  HENT:     Head: Normocephalic.     Comments: Moderate hematoma noted to the left forehead.  No lacerations or bleeding.    Right Ear: External ear normal.     Left Ear: External ear normal.     Nose: Nose normal.     Mouth/Throat:     Mouth: Mucous membranes are moist.     Pharynx:  Oropharynx is clear. No oropharyngeal exudate or posterior oropharyngeal erythema.  Eyes:     Extraocular Movements: Extraocular movements intact.  Neck:     Comments: Mild TTP noted along the midline cervical spine in the region of C5-7.  No thoracic or lumbar spine pain.  No step-offs, crepitus, or deformities. Cardiovascular:     Rate and Rhythm: Normal rate and regular rhythm.     Pulses: Normal pulses.     Heart sounds: Normal heart sounds. No murmur heard.   No friction rub. No gallop.  Pulmonary:     Effort: Pulmonary effort is normal. No respiratory distress.     Breath sounds: Normal breath sounds. No stridor. No wheezing, rhonchi or rales.     Comments: No anterior chest wall tenderness. Chest:     Chest wall: No tenderness.  Abdominal:     General: Abdomen is flat.     Palpations: Abdomen is soft.     Tenderness: There is no abdominal tenderness.     Comments: Protuberant abdomen that is soft and nontender.  Musculoskeletal:        General: Tenderness present. Normal range of motion.     Cervical back: Normal range of motion and neck supple. Tenderness present.     Comments: Mild TTP noted along the dorsal aspect of the left wrist.  No deformities or soft tissue swelling.  Full range of motion of the wrist.  No tenderness appreciated in the left shoulder or elbow.  Grip strength  intact.  2+ radial pulses.  Mild TTP noted to the bilateral patellas.  Full active and passive range of motion of the bilateral ankles, knees, and hips.  Palpable DP pulse.  Distal sensation intact.  Skin:    General: Skin is warm and dry.  Neurological:     General: No focal deficit present.     Mental Status: She is alert and oriented to person, place, and time.     Comments: A&O x3.  Speaking clearly, coherently, and in complete sentences.  Moving all 4 extremities.  No gross deficits.  Psychiatric:        Mood and Affect: Mood normal.        Behavior: Behavior normal.   ED Results / Procedures / Treatments   Labs (all labs ordered are listed, but only abnormal results are displayed) Labs Reviewed - No data to display  EKG None  Radiology CT HEAD WO CONTRAST (5MM)  Result Date: 04/20/2021 CLINICAL DATA:  [place], anticoagulated EXAM: CT HEAD WITHOUT CONTRAST TECHNIQUE: Contiguous axial images were obtained from the base of the skull through the vertex without intravenous contrast. RADIATION DOSE REDUCTION: This exam was performed according to the departmental dose-optimization program which includes automated exposure control, adjustment of the mA and/or kV according to patient size and/or use of iterative reconstruction technique. COMPARISON:  02/19/2021 FINDINGS: Brain: Stable hypodensities throughout the periventricular and subcortical white matter consistent with chronic small vessel ischemic change. No evidence of acute infarct or hemorrhage. Lateral ventricles and remaining midline structures are unremarkable. No acute extra-axial fluid collections. No mass effect. Vascular: No hyperdense vessel or unexpected calcification. Skull: Small left frontal scalp hematoma. No underlying fracture. The remainder of the calvarium is unremarkable. Sinuses/Orbits: No acute finding. Other: None. IMPRESSION: 1. Small left frontal scalp hematoma. 2. No acute intracranial process. 3. Stable chronic  small-vessel ischemic changes throughout the white matter. Electronically Signed   By: Randa Ngo M.D.   On: 04/20/2021 22:38    Procedures Procedures  Medications Ordered in ED Medications  acetaminophen (TYLENOL) tablet 650 mg (650 mg Oral Given 04/21/21 0032)    ED Course/ Medical Decision Making/ A&P                           Medical Decision Making Amount and/or Complexity of Data Reviewed Radiology: ordered.  Risk OTC drugs.  Pt is a 81 y.o. female who is anticoagulated on Eliquis and presents to the emergency department due to a forward fall from a wheelchair in which she struck the left side of her head.  Imaging: CT scan of the cervical spine shows no fracture or subluxation. Multilevel degenerative changes. CT scan of the head shows small left frontal scalp hematoma.  No acute intracranial process.  Stable chronic small vessel ischemic changes throughout the white matter. X-ray of the right knee shows advanced tricompartment degenerative changes.  No acute bony abnormality. X-ray of the left knee shows moderate degenerative changes.  No acute bony abnormality. X-ray of the wrist shows chronic changes versus less likely acute cortical avulsion fracture from the dorsal aspect of the lunate.  I, Rayna Sexton, PA-C, personally reviewed and evaluated these images and lab results as part of my medical decision-making.  On my exam patient is A&O x3.  Moving all 4 extremities.  No gross deficits.  She has a hematoma to the left forehead but no lacerations or signs of bleeding.  Additionally has mild tenderness to both knees as well as the left wrist.  Good range of motion of all 3 joints.  Neurovascularly intact in all 4 extremities.  I obtained a CT scan of the head and cervical spine as well as x-rays of the left wrist and the bilateral knees.  All imaging appears reassuring.  X-ray of the left wrist shows "chronic changes versus less likely acute cortical avulsion fracture  from the dorsal aspect of the lunate."  Patient has good range of motion of the wrist and minimal tenderness in the region.  Feel that this is more likely chronic changes versus fracture.  Patient given a dose of Tylenol and her pain appears to be well controlled.  Lying comfortably in bed.  Patient did have an episode of hypotension during her stay but this has since resolved.  She was dialyzed this morning which is likely the cause of this.  Maps appear reassuring.  Feel the patient is stable for discharge at this time and she and her daughter at bedside are agreeable.  Patient will be transported back to her skilled nursing facility via [place].  Note: Portions of this report may have been transcribed using voice recognition software. Every effort was made to ensure accuracy; however, inadvertent computerized transcription errors may be present.   Final Clinical Impression(s) / ED Diagnoses Final diagnoses:  Fall, initial encounter  Injury of head, initial encounter  Acute pain of both knees  Left wrist pain   Rx / DC Orders ED Discharge Orders     None         Rayna Sexton, PA-C 04/21/21 0214    Lorelle Gibbs, DO 04/21/21 2119

## 2021-04-21 ENCOUNTER — Encounter (HOSPITAL_COMMUNITY): Payer: Self-pay

## 2021-04-21 DIAGNOSIS — Z992 Dependence on renal dialysis: Secondary | ICD-10-CM | POA: Diagnosis not present

## 2021-04-21 DIAGNOSIS — M79651 Pain in right thigh: Secondary | ICD-10-CM | POA: Diagnosis not present

## 2021-04-21 DIAGNOSIS — I959 Hypotension, unspecified: Secondary | ICD-10-CM | POA: Diagnosis not present

## 2021-04-21 DIAGNOSIS — I480 Paroxysmal atrial fibrillation: Secondary | ICD-10-CM | POA: Diagnosis not present

## 2021-04-21 DIAGNOSIS — N186 End stage renal disease: Secondary | ICD-10-CM | POA: Diagnosis not present

## 2021-04-21 DIAGNOSIS — R102 Pelvic and perineal pain: Secondary | ICD-10-CM | POA: Diagnosis not present

## 2021-04-21 DIAGNOSIS — R41841 Cognitive communication deficit: Secondary | ICD-10-CM | POA: Diagnosis not present

## 2021-04-21 DIAGNOSIS — Z743 Need for continuous supervision: Secondary | ICD-10-CM | POA: Diagnosis not present

## 2021-04-21 DIAGNOSIS — R279 Unspecified lack of coordination: Secondary | ICD-10-CM | POA: Diagnosis not present

## 2021-04-21 DIAGNOSIS — M6258 Muscle wasting and atrophy, not elsewhere classified, other site: Secondary | ICD-10-CM | POA: Diagnosis not present

## 2021-04-21 DIAGNOSIS — M79652 Pain in left thigh: Secondary | ICD-10-CM | POA: Diagnosis not present

## 2021-04-21 DIAGNOSIS — I129 Hypertensive chronic kidney disease with stage 1 through stage 4 chronic kidney disease, or unspecified chronic kidney disease: Secondary | ICD-10-CM | POA: Diagnosis not present

## 2021-04-21 DIAGNOSIS — W19XXXA Unspecified fall, initial encounter: Secondary | ICD-10-CM | POA: Diagnosis not present

## 2021-04-21 DIAGNOSIS — M25551 Pain in right hip: Secondary | ICD-10-CM | POA: Diagnosis not present

## 2021-04-21 DIAGNOSIS — M25552 Pain in left hip: Secondary | ICD-10-CM | POA: Diagnosis not present

## 2021-04-21 DIAGNOSIS — M6281 Muscle weakness (generalized): Secondary | ICD-10-CM | POA: Diagnosis not present

## 2021-04-21 MED ORDER — ACETAMINOPHEN 325 MG PO TABS
650.0000 mg | ORAL_TABLET | Freq: Four times a day (QID) | ORAL | Status: DC | PRN
  Administered 2021-04-21: 650 mg via ORAL
  Filled 2021-04-21: qty 2

## 2021-04-21 NOTE — ED Notes (Signed)
Patient complaining of 7/10 head and left leg pain. Patient requesting pain medication at this time. This RN made provider [facility] PA aware of same. Will administer medication to patient once ordered. Patient repositioned in bed for comfort and warm blankets provided.

## 2021-04-21 NOTE — ED Notes (Signed)
Trauma Response Nurse Documentation   Stephanie Woodward is a 81 y.o. female arriving to [facility] ED via EMS  On Eliquis (apixaban) daily. Trauma was activated as a Level 2 by ED charge RN based on the following trauma criteria Elderly patients > 65 with head trauma on anti-coagulation (excluding ASA). Trauma team at the bedside on patient arrival. Patient cleared for CT by Dr. Dina Rich EDP. Patient to CT with TRN. GCS 15.  History   Past Medical History:  Diagnosis Date   Arthritis    Asthma    Atrial fibrillation (HCC)    CHF (congestive heart failure) ([place])    CKD (chronic kidney disease), stage III (HCC)    COPD (chronic obstructive pulmonary disease) (HCC)    Diabetes mellitus    INSULIN DEPENDENT   Gout    Heart murmur    no issues per pt   History of kidney stones    Hyperlipemia    Hypertension    Psoriasis    Renal disorder    congenital   Single kidney    Sleep apnea    doesn't use the Cpap     Past Surgical History:  Procedure Laterality Date   A/V FISTULAGRAM Left 10/31/2020   Procedure: A/V FISTULAGRAM;  Surgeon: Angelia Mould, MD;  Location: [place] CV LAB;  Service: Cardiovascular;  Laterality: Left;   ABDOMINAL HYSTERECTOMY     AV FISTULA PLACEMENT Left 11/17/2018   Procedure: BRACHIO-CEPHALIC ARTERIOVENOUS (AV) FISTULA CREATION IN LEFT ARM;  Surgeon: Marty Heck, MD;  Location: [place];  Service: Vascular;  Laterality: Left;   CARDIOVERSION N/A 03/03/2021   Procedure: CARDIOVERSION;  Surgeon: Larey Dresser, MD;  Location: [facility] ENDOSCOPY;  Service: Cardiovascular;  Laterality: N/A;   CARDIOVERSION N/A 03/12/2021   Procedure: CARDIOVERSION;  Surgeon: Larey Dresser, MD;  Location: [facility] ENDOSCOPY;  Service: Cardiovascular;  Laterality: N/A;   CHOLECYSTECTOMY     INSERTION OF DIALYSIS CATHETER Right 01/26/2019   Procedure: INSERTION OF DIALYSIS CATHETER, right internal jugular;  Surgeon: Angelia Mould, MD;  Location: [place];  Service:  Vascular;  Laterality: Right;   LEFT AND RIGHT HEART CATHETERIZATION WITH CORONARY ANGIOGRAM N/A 11/29/2013   Procedure: LEFT AND RIGHT HEART CATHETERIZATION WITH CORONARY ANGIOGRAM;  Surgeon: Jacolyn Reedy, MD;  Location: [facility] CATH LAB;  Service: Cardiovascular;  Laterality: N/A;   LEFT HEART CATH AND CORONARY ANGIOGRAPHY N/A 02/24/2021   Procedure: LEFT HEART CATH AND CORONARY ANGIOGRAPHY;  Surgeon: Larey Dresser, MD;  Location: [place] CV LAB;  Service: Cardiovascular;  Laterality: N/A;   PERIPHERAL VASCULAR INTERVENTION Left 10/31/2020   Procedure: PERIPHERAL VASCULAR INTERVENTION;  Surgeon: Angelia Mould, MD;  Location: [place] CV LAB;  Service: Cardiovascular;  Laterality: Left;   RIGHT HEART CATH N/A 11/23/2017   Procedure: RIGHT HEART CATH;  Surgeon: Larey Dresser, MD;  Location: [place] CV LAB;  Service: Cardiovascular;  Laterality: N/A;   RIGHT/LEFT HEART CATH AND CORONARY ANGIOGRAPHY N/A 06/15/2019   Procedure: RIGHT/LEFT HEART CATH AND CORONARY ANGIOGRAPHY;  Surgeon: Larey Dresser, MD;  Location: [place] CV LAB;  Service: Cardiovascular;  Laterality: N/A;       Initial Focused Assessment (If applicable, or please see trauma documentation): Alert female, family at bedside Hematoma left forehead  CT's Completed:   CT Head and CT C-Spine   Interventions:  CTs as above XRAY left wrist and bilateral knees  Plan for disposition:  Pending at this time  Consults completed:  none at the time of this note.  Event Summary: Patient arrives via EMS after a fall out of her wheelchair. TRN late to resus, missed primary/secondary assessment by EDP and EMS report d/t other traumas in dept. Hematoma left forehead. Escorted patient to CTs and XRAY. Pending imaging results at this time.   MTP Summary (If applicable): NA  Bedside handoff with ED RN Amy.    Aidin Doane O Kalif Kattner  Trauma Response RN  Please call TRN at [phone] for further assistance.

## 2021-04-21 NOTE — Discharge Instructions (Signed)
Please continue to monitor your symptoms closely.  If you develop any new or worsening symptoms please come back to the emergency department.

## 2021-04-22 DIAGNOSIS — D631 Anemia in chronic kidney disease: Secondary | ICD-10-CM | POA: Diagnosis not present

## 2021-04-22 DIAGNOSIS — Z992 Dependence on renal dialysis: Secondary | ICD-10-CM | POA: Diagnosis not present

## 2021-04-22 DIAGNOSIS — M6281 Muscle weakness (generalized): Secondary | ICD-10-CM | POA: Diagnosis not present

## 2021-04-22 DIAGNOSIS — N186 End stage renal disease: Secondary | ICD-10-CM | POA: Diagnosis not present

## 2021-04-22 DIAGNOSIS — N2581 Secondary hyperparathyroidism of renal origin: Secondary | ICD-10-CM | POA: Diagnosis not present

## 2021-04-22 DIAGNOSIS — M6258 Muscle wasting and atrophy, not elsewhere classified, other site: Secondary | ICD-10-CM | POA: Diagnosis not present

## 2021-04-22 DIAGNOSIS — R41841 Cognitive communication deficit: Secondary | ICD-10-CM | POA: Diagnosis not present

## 2021-04-22 DIAGNOSIS — L299 Pruritus, unspecified: Secondary | ICD-10-CM | POA: Diagnosis not present

## 2021-04-23 DIAGNOSIS — F5101 Primary insomnia: Secondary | ICD-10-CM | POA: Diagnosis not present

## 2021-04-24 DIAGNOSIS — I2510 Atherosclerotic heart disease of native coronary artery without angina pectoris: Secondary | ICD-10-CM | POA: Diagnosis not present

## 2021-04-24 DIAGNOSIS — Z6834 Body mass index (BMI) 34.0-34.9, adult: Secondary | ICD-10-CM | POA: Diagnosis not present

## 2021-04-24 DIAGNOSIS — E662 Morbid (severe) obesity with alveolar hypoventilation: Secondary | ICD-10-CM | POA: Diagnosis not present

## 2021-04-24 DIAGNOSIS — G479 Sleep disorder, unspecified: Secondary | ICD-10-CM | POA: Diagnosis not present

## 2021-04-24 DIAGNOSIS — I480 Paroxysmal atrial fibrillation: Secondary | ICD-10-CM | POA: Diagnosis not present

## 2021-04-24 DIAGNOSIS — N186 End stage renal disease: Secondary | ICD-10-CM | POA: Diagnosis not present

## 2021-04-24 DIAGNOSIS — Z794 Long term (current) use of insulin: Secondary | ICD-10-CM | POA: Diagnosis not present

## 2021-04-24 DIAGNOSIS — Z992 Dependence on renal dialysis: Secondary | ICD-10-CM | POA: Diagnosis not present

## 2021-04-24 DIAGNOSIS — I779 Disorder of arteries and arterioles, unspecified: Secondary | ICD-10-CM | POA: Diagnosis not present

## 2021-04-24 DIAGNOSIS — J449 Chronic obstructive pulmonary disease, unspecified: Secondary | ICD-10-CM | POA: Diagnosis not present

## 2021-04-24 DIAGNOSIS — R1310 Dysphagia, unspecified: Secondary | ICD-10-CM | POA: Diagnosis not present

## 2021-04-24 DIAGNOSIS — E1169 Type 2 diabetes mellitus with other specified complication: Secondary | ICD-10-CM | POA: Diagnosis not present

## 2021-04-25 ENCOUNTER — Emergency Department (HOSPITAL_COMMUNITY): Payer: HMO

## 2021-04-25 ENCOUNTER — Encounter (HOSPITAL_COMMUNITY): Payer: Self-pay | Admitting: Emergency Medicine

## 2021-04-25 ENCOUNTER — Emergency Department (HOSPITAL_COMMUNITY)
Admission: EM | Admit: 2021-04-25 | Discharge: 2021-04-26 | Disposition: A | Discharge status: Skilled Nursing Facility | Payer: HMO | Attending: Emergency Medicine | Admitting: Emergency Medicine

## 2021-04-25 ENCOUNTER — Other Ambulatory Visit: Payer: Self-pay

## 2021-04-25 DIAGNOSIS — R519 Headache, unspecified: Secondary | ICD-10-CM | POA: Insufficient documentation

## 2021-04-25 DIAGNOSIS — Z7901 Long term (current) use of anticoagulants: Secondary | ICD-10-CM | POA: Insufficient documentation

## 2021-04-25 DIAGNOSIS — R109 Unspecified abdominal pain: Secondary | ICD-10-CM | POA: Diagnosis not present

## 2021-04-25 DIAGNOSIS — M545 Low back pain, unspecified: Secondary | ICD-10-CM | POA: Diagnosis not present

## 2021-04-25 DIAGNOSIS — I700 Atherosclerosis of aorta: Secondary | ICD-10-CM | POA: Diagnosis not present

## 2021-04-25 DIAGNOSIS — W19XXXA Unspecified fall, initial encounter: Secondary | ICD-10-CM | POA: Diagnosis not present

## 2021-04-25 DIAGNOSIS — M542 Cervicalgia: Secondary | ICD-10-CM | POA: Insufficient documentation

## 2021-04-25 DIAGNOSIS — Z794 Long term (current) use of insulin: Secondary | ICD-10-CM | POA: Insufficient documentation

## 2021-04-25 DIAGNOSIS — N261 Atrophy of kidney (terminal): Secondary | ICD-10-CM | POA: Diagnosis not present

## 2021-04-25 DIAGNOSIS — Z79899 Other long term (current) drug therapy: Secondary | ICD-10-CM | POA: Diagnosis not present

## 2021-04-25 DIAGNOSIS — N949 Unspecified condition associated with female genital organs and menstrual cycle: Secondary | ICD-10-CM

## 2021-04-25 DIAGNOSIS — R9431 Abnormal electrocardiogram [ECG] [EKG]: Secondary | ICD-10-CM | POA: Diagnosis not present

## 2021-04-25 DIAGNOSIS — N186 End stage renal disease: Secondary | ICD-10-CM

## 2021-04-25 DIAGNOSIS — E278 Other specified disorders of adrenal gland: Secondary | ICD-10-CM | POA: Diagnosis not present

## 2021-04-25 DIAGNOSIS — R4182 Altered mental status, unspecified: Secondary | ICD-10-CM | POA: Diagnosis not present

## 2021-04-25 DIAGNOSIS — R1084 Generalized abdominal pain: Secondary | ICD-10-CM | POA: Diagnosis not present

## 2021-04-25 DIAGNOSIS — Z0389 Encounter for observation for other suspected diseases and conditions ruled out: Secondary | ICD-10-CM | POA: Diagnosis not present

## 2021-04-25 DIAGNOSIS — M25552 Pain in left hip: Secondary | ICD-10-CM | POA: Insufficient documentation

## 2021-04-25 DIAGNOSIS — N9489 Other specified conditions associated with female genital organs and menstrual cycle: Secondary | ICD-10-CM

## 2021-04-25 DIAGNOSIS — I959 Hypotension, unspecified: Secondary | ICD-10-CM | POA: Diagnosis not present

## 2021-04-25 LAB — URINALYSIS, ROUTINE W REFLEX MICROSCOPIC
Bilirubin Urine: NEGATIVE
Glucose, UA: NEGATIVE mg/dL
Ketones, ur: NEGATIVE mg/dL
Leukocytes,Ua: NEGATIVE
Nitrite: NEGATIVE
Protein, ur: >=300 mg/dL — AB
Specific Gravity, Urine: 1.015 (ref 1.005–1.030)
pH: 6 (ref 5.0–8.0)

## 2021-04-25 LAB — BASIC METABOLIC PANEL
Anion gap: 10 (ref 5–15)
BUN: 17 mg/dL (ref 8–23)
CO2: 28 mmol/L (ref 22–32)
Calcium: 9.3 mg/dL (ref 8.9–10.3)
Chloride: 101 mmol/L (ref 98–111)
Creatinine, Ser: 4.57 mg/dL — ABNORMAL HIGH (ref 0.44–1.00)
GFR, Estimated: 9 mL/min — ABNORMAL LOW (ref >60)
Glucose, Bld: 136 mg/dL — ABNORMAL HIGH (ref 70–99)
Potassium: 4.6 mmol/L (ref 3.5–5.1)
Sodium: 139 mmol/L (ref 135–145)

## 2021-04-25 LAB — CBC
HCT: 36 % (ref 36.0–46.0)
Hemoglobin: 11.7 g/dL — ABNORMAL LOW (ref 12.0–15.0)
MCH: 31.3 pg (ref 26.0–34.0)
MCHC: 32.5 g/dL (ref 30.0–36.0)
MCV: 96.3 fL (ref 80.0–100.0)
Platelets: 225 10*3/uL (ref 150–400)
RBC: 3.74 MIL/uL — ABNORMAL LOW (ref 3.87–5.11)
RDW: 15.9 % — ABNORMAL HIGH (ref 11.5–15.5)
WBC: 11.9 10*3/uL — ABNORMAL HIGH (ref 4.0–10.5)
nRBC: 0 % (ref 0.0–0.2)

## 2021-04-25 MED ORDER — TRAZODONE HCL 50 MG PO TABS: 25.0000 mg | ORAL_TABLET | Freq: Every evening | ORAL | Status: DC | PRN

## 2021-04-25 MED ORDER — LIDOCAINE 5 % EX PTCH
1.0000 | MEDICATED_PATCH | CUTANEOUS | Status: DC
  Administered 2021-04-25: 1 via TRANSDERMAL
  Filled 2021-04-25: qty 1

## 2021-04-25 MED ORDER — APIXABAN 5 MG PO TABS
5.0000 mg | ORAL_TABLET | Freq: Two times a day (BID) | ORAL | Status: DC
Start: 2021-04-25 — End: 2021-04-26
  Administered 2021-04-25: 5 mg via ORAL
  Filled 2021-04-25: qty 1

## 2021-04-25 MED ORDER — HYDROCODONE-ACETAMINOPHEN 5-325 MG PO TABS
1.0000 | ORAL_TABLET | Freq: Once | ORAL | Status: AC
  Administered 2021-04-25: 1 via ORAL
  Filled 2021-04-25: qty 1

## 2021-04-25 MED ORDER — TRAMADOL HCL 50 MG PO TABS
50.0000 mg | ORAL_TABLET | Freq: Four times a day (QID) | ORAL | Status: DC | PRN
  Administered 2021-04-25: 50 mg via ORAL
  Filled 2021-04-25: qty 1

## 2021-04-25 MED ORDER — TRAMADOL HCL 50 MG PO TABS
50.0000 mg | ORAL_TABLET | Freq: Once | ORAL | Status: AC
  Administered 2021-04-25: 50 mg via ORAL
  Filled 2021-04-25: qty 1

## 2021-04-25 NOTE — ED Notes (Signed)
[facility] called for transport.  

## 2021-04-25 NOTE — ED Notes (Signed)
Patient transported to Ultrasound 

## 2021-04-25 NOTE — ED Provider Notes (Addendum)
[facility] EMERGENCY DEPARTMENT Provider Note   CSN: [id number] Arrival date & time: 04/25/21  7035     History  Chief Complaint  Patient presents with   Generalized Body Aches    Entire left side pain    Donna Snooks is a 81 y.o. female.  Patient presents c/o left flank and hip area pain in the past several days. Pt notes fall 2/27, and unclear from history whether pain started before that fall or after. Pain constant, dull, worse when moves about stands, and occasionally radiates down left leg.  Was seen in ED after fall, and had cts of head and neck and bil knee xrays.  No headache. No neck or midline back pain. No chest pain or discomfort. No sob. No abd pain or nv. Having normal bms. No dysuria or hematuria. No vaginal discharge or bleeding. No skin lesions or rash in area of pain. No saddle area or leg numbness. No weakness.   The history is provided by the patient, the EMS personnel and medical records.      Home Medications Prior to Admission medications   Medication Sig Start Date End Date Taking? Authorizing Provider  acetaminophen (TYLENOL) 500 MG tablet Take 500 mg by mouth every 6 (six) hours as needed (pain).    [provider]  albuterol (PROVENTIL HFA;VENTOLIN HFA) 108 (90 BASE) MCG/ACT inhaler Inhale 2 puffs into the lungs every 4 (four) hours as needed for shortness of breath.    [provider]  amiodarone (PACERONE) 200 MG tablet Take 200 mg by mouth daily.    [provider]  atorvastatin (LIPITOR) 80 MG tablet Take 80 mg by mouth at bedtime.    [provider]  B Complex-C-Zn-Folic Acid (DIALYVITE 009-FGHW 15) 0.8 MG TABS Take 1 tablet by mouth in the morning. Patient not taking: Reported on 04/09/2021 03/07/19   [provider]  Blood Glucose Monitoring Suppl (FREESTYLE LITE) DEVI Use as instructed to check blood sugar 4 times daily Patient taking differently: 1 each by Other route See admin  instructions. Use as instructed to check blood sugar 4 times daily 12/29/20   Shamleffer, Melanie Crazier, MD  budesonide-formoterol [facility]) 160-4.5 MCG/ACT inhaler Inhale 2 puffs into the lungs 2 (two) times daily. 04/09/20   Collene Gobble, MD  Continuous Blood Gluc Receiver (FREESTYLE LIBRE READER) DEVI 1 kit by Does not apply route daily. Use as instructed E11.65 09/12/19   Shamleffer, Melanie Crazier, MD  ELIQUIS 5 MG TABS tablet Take 1 tablet by mouth twice daily 07/23/20   Larey Dresser, MD  glucose blood (FREESTYLE LITE) test strip USE AS DIRECTED 4 TIMES DAILY Patient taking differently: 1 each by Other route See admin instructions. USE AS DIRECTED 4 TIMES DAILY 02/02/21   Shamleffer, Melanie Crazier, MD  insulin aspart (NOVOLOG) 100 UNIT/ML injection CBG 70 - 120: 0 units CBG 121 - 150: 1 unit CBG 151 - 200: 2 units CBG 201 - 250: 3 units CBG 251 - 300: 5 units CBG 301 - 350: 7 units CBG 351 - 400: 9 units 03/05/21   Hosie Poisson, MD  insulin degludec (TRESIBA FLEXTOUCH) 100 UNIT/ML FlexTouch Pen Inject 18 Units into the skin daily. Patient taking differently: Inject 23 Units into the skin daily. 02/06/20   Shamleffer, Melanie Crazier, MD  Insulin Pen Needle 32G X 4 MM MISC 1 Device by Does not apply route in the morning, at noon, in the evening, and at bedtime. 02/06/20   Shamleffer,  Melanie Crazier, MD  ketoconazole (NIZORAL) 2 % cream Apply 1 application topically every morning. 02/16/21   [provider]  ketoconazole (NIZORAL) 2 % shampoo Apply 1 application topically 2 (two) times a week. Mondays & Thursdays 02/16/21   [provider]  latanoprost (XALATAN) 0.005 % ophthalmic solution Place 1 drop into both eyes at bedtime.    [provider]  lidocaine (LIDODERM) 5 % Place 4 patches onto the skin daily. 11/12/20   [provider]  lidocaine (LINDAMANTLE) 3 % CREA cream Apply 1 application topically every Monday, Wednesday, and Friday. 03/06/20    [provider]  midodrine (PROAMATINE) 5 MG tablet Take 5 mg by mouth 3 (three) times daily with meals.    [provider]  mirtazapine (REMERON) 15 MG tablet Take 15 mg by mouth at bedtime.    [provider]  multivitamin (RENA-VIT) TABS tablet Take 1 tablet by mouth at bedtime. 03/05/21   Hosie Poisson, MD  Nutritional Supplements (FEEDING SUPPLEMENT, NEPRO CARB STEADY,) LIQD Take 237 mLs by mouth 3 (three) times daily with meals. 03/05/21 06/03/21  Hosie Poisson, MD  ondansetron (ZOFRAN) 4 MG tablet Take 4 mg by mouth 2 (two) times daily as needed for nausea.  10/26/18   [provider]  pantoprazole (PROTONIX) 40 MG tablet Take 40 mg by mouth every evening.  10/26/18   [provider]  senna-docusate (SENOKOT-S) 8.6-50 MG tablet Take 1 tablet by mouth at bedtime as needed for mild constipation. 03/05/21   Hosie Poisson, MD  Skin Protectants, Misc. (MINERIN CREME) CREA Apply 1 application topically daily.    [provider]  sucroferric oxyhydroxide (VELPHORO) 500 MG chewable tablet Chew 500 mg by mouth See admin instructions. Crush or chew or swallow 1 tablet three times a day with meals and 1 tablet two times a day with snacks.    [provider]      Allergies    Eggs or egg-derived products, Lisinopril, Penicillins, and Other    Review of Systems   Review of Systems  Constitutional:  Negative for fever.  HENT:  Negative for nosebleeds and sore throat.   Eyes:  Negative for visual disturbance.  Respiratory:  Negative for shortness of breath.   Cardiovascular:  Negative for chest pain.  Gastrointestinal:  Negative for abdominal pain, constipation, diarrhea and vomiting.  Genitourinary:  Positive for flank pain. Negative for dysuria and hematuria.  Musculoskeletal:  Negative for neck pain.  Skin:  Negative for rash.  Neurological:  Negative for weakness, numbness and headaches.  Hematological:  Does not bruise/bleed easily.   Psychiatric/Behavioral:  Negative for confusion.    Physical Exam Updated Vital Signs SpO2 94%  Physical Exam Vitals and nursing note reviewed.  Constitutional:      Appearance: Normal appearance. She is well-developed.  HENT:     Head: Atraumatic.     Nose: Nose normal.     Mouth/Throat:     Mouth: Mucous membranes are moist.  Eyes:     General: No scleral icterus.    Conjunctiva/sclera: Conjunctivae normal.  Neck:     Trachea: No tracheal deviation.  Cardiovascular:     Rate and Rhythm: Normal rate and regular rhythm.     Pulses: Normal pulses.     Heart sounds: Normal heart sounds. No murmur heard.   No friction rub. No gallop.  Pulmonary:     Effort: Pulmonary effort is normal. No respiratory distress.     Breath sounds: Normal breath sounds.  Abdominal:     General: Bowel sounds are normal. There is no distension.     Palpations: Abdomen is soft.     Tenderness: There is no abdominal tenderness.  Genitourinary:    Comments: No cva tenderness.  Musculoskeletal:        General: No swelling.     Cervical back: Normal range of motion and neck supple. No rigidity. No muscular tenderness.     Comments: Lumbar tenderness, otherwise, CTLS spine, non tender, aligned, no step off. Tenderness/pain w rom left hip. No LLE swelling. Distal pulses equal and palp bil extremities.   Skin:    General: Skin is warm and dry.     Findings: No rash.     Comments: No skin lesions/rash in area of pain.   Neurological:     Mental Status: She is alert.     Comments: Alert, speech normal. Motor intact bil, stre 5/5.Sens grossly intact bil.   Psychiatric:        Mood and Affect: Mood normal.    ED Results / Procedures / Treatments   Labs (all labs ordered are listed, but only abnormal results are displayed) Results for orders placed or performed during the hospital encounter of 04/25/21  CBC  Result Value Ref Range   WBC 11.9 (H) 4.0 - 10.5 K/uL   RBC 3.74 (L) 3.87 - 5.11 MIL/uL    Hemoglobin 11.7 (L) 12.0 - 15.0 g/dL   HCT 36.0 36.0 - 46.0 %   MCV 96.3 80.0 - 100.0 fL   MCH 31.3 26.0 - 34.0 pg   MCHC 32.5 30.0 - 36.0 g/dL   RDW 15.9 (H) 11.5 - 15.5 %   Platelets 225 150 - 400 K/uL   nRBC 0.0 0.0 - 0.2 %  Basic metabolic panel  Result Value Ref Range   Sodium 139 135 - 145 mmol/L   Potassium 4.6 3.5 - 5.1 mmol/L   Chloride 101 98 - 111 mmol/L   CO2 28 22 - 32 mmol/L   Glucose, Bld 136 (H) 70 - 99 mg/dL   BUN 17 8 - 23 mg/dL   Creatinine, Ser 4.57 (H) 0.44 - 1.00 mg/dL   Calcium 9.3 8.9 - 10.3 mg/dL   GFR, Estimated 9 (L) >60 mL/min   Anion gap 10 5 - 15  Urinalysis, Routine w reflex microscopic Urine, In & Out Cath  Result Value Ref Range   Color, Urine YELLOW YELLOW   APPearance CLEAR CLEAR   Specific Gravity, Urine 1.015 1.005 - 1.030   pH 6.0 5.0 - 8.0   Glucose, UA NEGATIVE NEGATIVE mg/dL   Hgb urine dipstick SMALL (A) NEGATIVE   Bilirubin Urine NEGATIVE NEGATIVE   Ketones, ur NEGATIVE NEGATIVE mg/dL   Protein, ur >=300 (A) NEGATIVE mg/dL   Nitrite NEGATIVE NEGATIVE   Leukocytes,Ua NEGATIVE NEGATIVE   RBC / HPF 0-5 0 - 5 RBC/hpf   WBC, UA 0-5 0 - 5 WBC/hpf   Bacteria, UA RARE (A) NONE SEEN   Squamous Epithelial / LPF 0-5 0 - 5   Mucus PRESENT    Hyaline Casts, UA PRESENT    CT Abdomen Pelvis Wo Contrast  Result Date: 04/25/2021 CLINICAL DATA:  Flank pain.  Evaluate for kidney stone. EXAM: CT ABDOMEN AND PELVIS WITHOUT CONTRAST TECHNIQUE: Multidetector CT imaging of the abdomen and pelvis was performed following the standard protocol without IV contrast. RADIATION DOSE REDUCTION: This exam was performed according to the departmental dose-optimization program which includes automated exposure control, adjustment of  the mA and/or kV according to patient size and/or use of iterative reconstruction technique. COMPARISON:  None. FINDINGS: Lower chest: No acute abnormality. Hepatobiliary: No focal liver abnormality is seen. Status post cholecystectomy. No  biliary dilatation. Pancreas: Unremarkable. No pancreatic ductal dilatation or surrounding inflammatory changes. Spleen: Normal in size without focal abnormality. Adrenals/Urinary Tract: Normal right adrenal gland. There is a nodule within the left adrenal gland which measures 2.7 x 2.0 cm with radiodensity measurements of 12.3 Hounsfield units, image 24/3. 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 No kidney stones identified. No hydronephrosis, hydroureter or ureteral lithiasis. Urinary bladder appears partially decompressed but is otherwise unremarkable. Stomach/Bowel: Stomach appears normal. The appendix is not visualized. Surgical clips identified at the cecal base which may reflect sequelae of prior appendectomy. No bowel wall thickening, inflammation, or distension. Vascular/Lymphatic: Aortic atherosclerosis without aneurysm. No abdominopelvic adenopathy. Reproductive: The uterus appears surgically absent. There are bilateral cystic adnexal masses. On the right the cystic mass measures 7.7 x 5.1 cm. On the left cystic mass measures 7.2 by 6.5 cm, image 57/3. Other: No free fluid or fluid collections identified Musculoskeletal: Lumbar degenerative disc disease identified. Bones appear osteopenic. No acute or suspicious osseous findings. IMPRESSION: 1. No acute findings within the abdomen or pelvis. 2. 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 3. Bilateral cystic adnexal masses. Most severe: 7.7 cm, not adequately characterized. Recommend prompt follow-up with pelvic US. 4. Indeterminate left adrenal nodule measuring 2.7 cm. Recommend nonemergent adrenal washout CT or chemical shift MRI. JACR 2017 Aug; 14(8):1038-44, JCAT 2016 Mar-Apr; 40(2):194-200, Urol J 2006 Spring; 3(2):71-4. 5. Aortic Atherosclerosis (ICD10-I70.0). Electronically Signed   By: Kerby Moors M.D.   On: 04/25/2021 10:20   DG Lumbar Spine Complete  Result Date: 04/25/2021 CLINICAL DATA:   81 year old female status post fall with pain. EXAM: LUMBAR SPINE - COMPLETE 4+ VIEW COMPARISON:  Lumbar radiographs 06/23/2009. FINDINGS: Normal lumbar segmentation. Lumbar lordosis has not significantly changed since 2011. Stable vertebral body height. Diffuse disc space loss since 2011, moderate or severe L3-L4 through L5-S1. New vacuum disc at L2-L3. Probable interbody ankylosis via bulky endplate osteophytes at L1-L2, L3-L4 and L4-L5. Questionable bilateral SI joint ankylosis which is new. No acute osseous abnormality identified. Calcified aortic atherosclerosis. Chronic right upper quadrant suture lines and surgical clips. Generator device projects over the left abdomen. Chronic right lower quadrant surgical clips. Negative visible bowel gas. IMPRESSION: 1. No acute osseous abnormality identified in the lumbar spine. 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 2. Chronic disc degeneration.  Aortic Atherosclerosis (ICD10-I70.0). Electronically Signed   By: Genevie Ann M.D.   On: 04/25/2021 08:10   DG Wrist Complete Left  Result Date: 04/20/2021 CLINICAL DATA:  Fall.  Left wrist pain. EXAM: LEFT WRIST - COMPLETE 3+ VIEW COMPARISON:  None. FINDINGS: Faint radiopaque densities along the dorsal aspect of the lunate on the lateral view likely chronic. Acute cortical avulsion injury is less likely. Clinical correlation is recommended. No other acute fracture. The bones are osteopenic. There is degenerative changes of the wrist and base of the thumb. The soft tissues are grossly unremarkable. IMPRESSION: Chronic changes versus less likely acute cortical avulsion fracture from the dorsal aspect of the lunate. Electronically Signed   By: Anner Crete M.D.   On: 04/20/2021 22:58   CT Head Wo Contrast  Result Date: 04/25/2021 CLINICAL DATA:  Altered mental status EXAM: CT HEAD WITHOUT CONTRAST TECHNIQUE: Contiguous axial images were  obtained from the base of the skull through the  vertex without intravenous contrast. RADIATION DOSE REDUCTION: This exam was performed according to the departmental dose-optimization program which includes automated exposure control, adjustment of the mA and/or kV according to patient size and/or use of iterative reconstruction technique. COMPARISON:  04/20/2021 FINDINGS: Brain: No acute intracranial findings are seen. Cortical sulci are prominent. There is decreased density in the periventricular white matter. Vascular: Unremarkable. Skull: No fracture is seen in the calvarium. Sinuses/Orbits: Unremarkable. Other: No significant interval changes are noted. IMPRESSION: No acute intracranial findings are seen in noncontrast CT brain. Atrophy. Small-vessel disease. Electronically Signed   By: Elmer Picker M.D.   On: 04/25/2021 13:18   CT HEAD WO CONTRAST (5MM)  Result Date: 04/20/2021 CLINICAL DATA:  [place], anticoagulated EXAM: CT HEAD WITHOUT CONTRAST TECHNIQUE: Contiguous axial images were obtained from the base of the skull through the vertex without intravenous contrast. RADIATION DOSE REDUCTION: This exam was performed according to the departmental dose-optimization program which includes automated exposure control, adjustment of the mA and/or kV according to patient size and/or use of iterative reconstruction technique. COMPARISON:  02/19/2021 FINDINGS: Brain: Stable hypodensities throughout the periventricular and subcortical white matter consistent with chronic small vessel ischemic change. No evidence of acute infarct or hemorrhage. Lateral ventricles and remaining midline structures are unremarkable. No acute extra-axial fluid collections. No mass effect. Vascular: No hyperdense vessel or unexpected calcification. Skull: Small left frontal scalp hematoma. No underlying fracture. The remainder of the calvarium is unremarkable. Sinuses/Orbits: No acute finding. Other: None. IMPRESSION: 1. Small left frontal scalp hematoma. 2. No acute intracranial  process. 3. Stable chronic small-vessel ischemic changes throughout the white matter. Electronically Signed   By: Randa Ngo M.D.   On: 04/20/2021 22:38   CT Cervical Spine Wo Contrast  Result Date: 04/20/2021 CLINICAL DATA:  Fall. EXAM: CT CERVICAL SPINE WITHOUT CONTRAST TECHNIQUE: Multidetector CT imaging of the cervical spine was performed without intravenous contrast. Multiplanar CT image reconstructions were also generated. RADIATION DOSE REDUCTION: This exam was performed according to the departmental dose-optimization program which includes automated exposure control, adjustment of the mA and/or kV according to patient size and/or use of iterative reconstruction technique. COMPARISON:  None. FINDINGS: Alignment: No acute subluxation. Skull base and vertebrae: No acute fracture. Soft tissues and spinal canal: No prevertebral fluid or swelling. No visible canal hematoma. Disc levels:  Multilevel degenerative changes. Upper chest: Negative. Other: None IMPRESSION: 1. No acute fracture or subluxation of the cervical spine. 2. Multilevel degenerative changes. Electronically Signed   By: Anner Crete M.D.   On: 04/20/2021 22:42   US PELVIS LIMITED (TRANSABDOMINAL ONLY)  Result Date: 04/25/2021 CLINICAL DATA:  Large cyst on CT, rule out torsion. EXAM: TRANSABDOMINAL AND TRANSVAGINAL ULTRASOUND OF PELVIS TECHNIQUE: Only transabdominal ultrasound examinations of the pelvis was performed. Transvaginal examination could not be performed because patient was unable to position herself for trans vaginal approach. Transabdominal technique was performed for global imaging of the pelvis including uterus, ovaries, adnexal regions, and pelvic cul-de-sac. COMPARISON:  CT examination performed earlier on the same date FINDINGS: Uterus Status post hysterectomy. Right ovary Not visualized on transabdominal approach. Left ovary Not visualized on transabdominal approach. Other findings No abnormal free fluid.  IMPRESSION: Nondiagnostic examination.  Bilateral ovaries were not visualized. Electronically Signed   By: Keane Police D.O.   On: 04/25/2021 11:54   DG Knee Complete 4 Views Left  Result Date: 04/20/2021 CLINICAL DATA:  Fall, pain EXAM: LEFT KNEE - COMPLETE  4+ VIEW COMPARISON:  None. FINDINGS: Moderate tricompartment degenerative changes. No joint effusion. No acute bony abnormality. Specifically, no fracture, subluxation, or dislocation. IMPRESSION: Moderate degenerative changes.  No acute bony abnormality. Electronically Signed   By: Rolm Baptise M.D.   On: 04/20/2021 22:57   DG Knee Complete 4 Views Right  Result Date: 04/20/2021 CLINICAL DATA:  Fall, pain EXAM: RIGHT KNEE - COMPLETE 4+ VIEW COMPARISON:  None. FINDINGS: Advanced tricompartment degenerative changes within the right knee. No joint effusion. No acute bony abnormality. Specifically, no fracture, subluxation, or dislocation. IMPRESSION: Advanced tricompartment degenerative changes. No acute bony abnormality. Electronically Signed   By: Rolm Baptise M.D.   On: 04/20/2021 22:56   DG HIP UNILAT W OR W/O PELVIS 2-3 VIEWS LEFT  Result Date: 04/25/2021 CLINICAL DATA:  81 year old female status post fall with pain. EXAM: DG HIP (WITH OR WITHOUT PELVIS) 2-3V LEFT COMPARISON:  Abdominal radiographs 12/26/2009. FINDINGS: Generator devices project over the medial right thigh and left abdomen. Femoral heads are normally located, with fairly symmetric bilateral hip joint space loss since 2011. Aortoiliac calcified atherosclerosis. Osteopenia. Pelvis appears intact. Grossly intact proximal right femur. Proximal left femur appears intact with prominent acetabular spurring. IMPRESSION: 1. No acute fracture or dislocation identified about the left hip or pelvis. 2. 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 Electronically Signed   By: Genevie Ann M.D.   On: 04/25/2021 08:07    EKG EKG Interpretation  Date/Time:  Saturday April 25 2021 11:44:43  EST Ventricular Rate:  74 PR Interval:  199 QRS Duration: 126 QT Interval:  454 QTC Calculation: 504 R Axis:   -79 Text Interpretation: Sinus rhythm Nonspecific IVCD with LAD infarct, old Confirmed by Lajean Saver [phone]) on 04/25/2021 12:24:18 PM  Radiology CT Abdomen Pelvis Wo Contrast  Result Date: 04/25/2021 CLINICAL DATA:  Flank pain.  Evaluate for kidney stone. EXAM: CT ABDOMEN AND PELVIS WITHOUT CONTRAST TECHNIQUE: Multidetector CT imaging of the abdomen and pelvis was performed following the standard protocol without IV contrast. RADIATION DOSE REDUCTION: This exam was performed according to the departmental dose-optimization program which includes automated exposure control, adjustment of the mA and/or kV according to patient size and/or use of iterative reconstruction technique. COMPARISON:  None. FINDINGS: Lower chest: No acute abnormality. Hepatobiliary: No focal liver abnormality is seen. Status post cholecystectomy. No biliary dilatation. Pancreas: Unremarkable. No pancreatic ductal dilatation or surrounding inflammatory changes. Spleen: Normal in size without focal abnormality. Adrenals/Urinary Tract: Normal right adrenal gland. There is a nodule within the left adrenal gland which measures 2.7 x 2.0 cm with radiodensity measurements of 12.3 Hounsfield units, image 24/3. 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 No kidney stones identified. No hydronephrosis, hydroureter or ureteral lithiasis. Urinary bladder appears partially decompressed but is otherwise unremarkable. Stomach/Bowel: Stomach appears normal. The appendix is not visualized. Surgical clips identified at the cecal base which may reflect sequelae of prior appendectomy. No bowel wall thickening, inflammation, or distension. Vascular/Lymphatic: Aortic atherosclerosis without aneurysm. No abdominopelvic adenopathy. Reproductive: The uterus appears surgically absent. There are bilateral cystic  adnexal masses. On the right the cystic mass measures 7.7 x 5.1 cm. On the left cystic mass measures 7.2 by 6.5 cm, image 57/3. Other: No free fluid or fluid collections identified Musculoskeletal: Lumbar degenerative disc disease identified. Bones appear osteopenic. No acute or suspicious osseous findings. IMPRESSION: 1. No acute findings within the abdomen or pelvis. 2. 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 3. Bilateral cystic adnexal  masses. Most severe: 7.7 cm, not adequately characterized. Recommend prompt follow-up with pelvic US. 4. Indeterminate left adrenal nodule measuring 2.7 cm. Recommend nonemergent adrenal washout CT or chemical shift MRI. JACR 2017 Aug; 14(8):1038-44, JCAT 2016 Mar-Apr; 40(2):194-200, Urol J 2006 Spring; 3(2):71-4. 5. Aortic Atherosclerosis (ICD10-I70.0). Electronically Signed   By: Kerby Moors M.D.   On: 04/25/2021 10:20   DG Lumbar Spine Complete  Result Date: 04/25/2021 CLINICAL DATA:  81 year old female status post fall with pain. EXAM: LUMBAR SPINE - COMPLETE 4+ VIEW COMPARISON:  Lumbar radiographs 06/23/2009. FINDINGS: Normal lumbar segmentation. Lumbar lordosis has not significantly changed since 2011. Stable vertebral body height. Diffuse disc space loss since 2011, moderate or severe L3-L4 through L5-S1. New vacuum disc at L2-L3. Probable interbody ankylosis via bulky endplate osteophytes at L1-L2, L3-L4 and L4-L5. Questionable bilateral SI joint ankylosis which is new. No acute osseous abnormality identified. Calcified aortic atherosclerosis. Chronic right upper quadrant suture lines and surgical clips. Generator device projects over the left abdomen. Chronic right lower quadrant surgical clips. Negative visible bowel gas. IMPRESSION: 1. No acute osseous abnormality identified in the lumbar spine. 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 2. Chronic disc degeneration.  Aortic Atherosclerosis (ICD10-I70.0).  Electronically Signed   By: Genevie Ann M.D.   On: 04/25/2021 08:10   DG HIP UNILAT W OR W/O PELVIS 2-3 VIEWS LEFT  Result Date: 04/25/2021 CLINICAL DATA:  81 year old female status post fall with pain. EXAM: DG HIP (WITH OR WITHOUT PELVIS) 2-3V LEFT COMPARISON:  Abdominal radiographs 12/26/2009. FINDINGS: Generator devices project over the medial right thigh and left abdomen. Femoral heads are normally located, with fairly symmetric bilateral hip joint space loss since 2011. Aortoiliac calcified atherosclerosis. Osteopenia. Pelvis appears intact. Grossly intact proximal right femur. Proximal left femur appears intact with prominent acetabular spurring. IMPRESSION: 1. No acute fracture or dislocation identified about the left hip or pelvis. 2. 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 Electronically Signed   By: Genevie Ann M.D.   On: 04/25/2021 08:07    Procedures Procedures    Medications Ordered in ED Medications - No data to display  ED Course/ Medical Decision Making/ A&P                           Medical Decision Making Problems Addressed: Adnexal cyst: undiagnosed new problem with uncertain prognosis Adnexal mass: undiagnosed new problem with uncertain prognosis Adrenal mass ([place]): undiagnosed new problem with uncertain prognosis ESRD (end stage renal disease) on dialysis [facility]): chronic illness or injury with exacerbation, progression, or side effects of treatment Left flank pain: undiagnosed new problem with uncertain prognosis Left hip pain: undiagnosed new problem with uncertain prognosis  Amount and/or Complexity of Data Reviewed Independent Historian: EMS    Details: family/ems - additional hx. External Data Reviewed: notes. Labs: ordered. Decision-making details documented in ED Course. Radiology: ordered and independent interpretation performed. Decision-making details documented in ED Course. ECG/medicine tests: ordered and independent interpretation performed.  Decision-making details documented in ED Course.  Risk Prescription drug management. Decision regarding hospitalization.   Iv ns. Labs ordered/sent. Imaging ordered.  Disposition including admission considered and discussed. Will get results of labs and ct imaging and revisit dispo decision.   Reviewed nursing notes and prior charts for additional history.  External reports reviewed - additional hx from EMS and patients facility.  Daughter now present - indicates patient had normal dialysis yesterday, indicates facility reported pt w left sided  pain, indicates pt does make small amount of urine at baseline, and that also facility noted mild confusion.  In ED, pt is alert, oriented.   Labs reviewed/interpreted by me - k normal.   Xrays reviewed/interpreted by me - no fx.   CT reviewed/interpreted by me - ovarian cysts, o/w neg acute.   U/s reviewed/interpreted by me - no def acute process.   Of note, pt with left side of body/flank/hip pain for several days, recent fall. On recheck, no focal abdominal or pelvic pain or tenderness on exam. No comfortably appearing. Presentation does not appear c/w acute ovarian torsion. Ct findings discussed - rec close outpatient ob/gyn f/u.  Also rec pcp f/u re symptoms, and for adrenal mass.   Additional recheck, vitals normal, no distress. Pt currently appears stable for d/c.   Return precautions provided.               Final Clinical Impression(s) / ED Diagnoses Final diagnoses:  None    Rx / DC Orders ED Discharge Orders     None         Lajean Saver, MD 04/25/21 1500

## 2021-04-25 NOTE — ED Notes (Signed)
Patient transported to CT 

## 2021-04-25 NOTE — ED Notes (Signed)
Attempted in and out cath. Patient unable to fully open legs. Will attempt again in a few minutes.  ?

## 2021-04-25 NOTE — Discharge Instructions (Addendum)
It was our pleasure to provide your ER care today - we hope that you feel better. ? ?Drink adequate fluids/stay well hydrated.  ? ?Take your pain medication as need. ? ?Your ct was read as showing: No acute findings within the abdomen or pelvis. 2. 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 3. Bilateral cystic adnexal masses. Most severe: 7.7 cm, not adequately characterized. Recommend prompt follow-up with pelvic US. 4. Indeterminate left adrenal nodule measuring 2.7 cm. Recommend nonemergent adrenal washout CT or chemical shift MRI. ? ?As relates the 'adnexal cystic masses' seen on your CT scan above - follow up with ob/gyn doctor in the next 1-2 weeks - call office Monday to arrange follow up appointment.  Also discuss follow up for the adrenal nodule with your doctor.  ? ?Follow up closely with your primary doctor this coming week. ? ?Return to ER if worse, new symptoms, fevers, chest pain, increased trouble breathing, vomiting, or other concern.  ?

## 2021-04-25 NOTE — ED Notes (Signed)
Received verbal report from Curt Bears R at this time ?

## 2021-04-25 NOTE — ED Triage Notes (Signed)
BIB [facility] from [facility]. C/o entire lt side pain and discomfort for the last 12 hrs.is on thinners and fell a week ago and was seen and evaluated here. CBG 174. ?

## 2021-04-25 NOTE — ED Notes (Signed)
Patient in X-ray labs to be obtained when patient returns.  ?

## 2021-04-25 NOTE — ED Notes (Signed)
Provider at bedside

## 2021-04-26 NOTE — ED Notes (Signed)
[facility] arrived for transport 

## 2021-04-27 DIAGNOSIS — N186 End stage renal disease: Secondary | ICD-10-CM | POA: Diagnosis not present

## 2021-04-27 DIAGNOSIS — Z992 Dependence on renal dialysis: Secondary | ICD-10-CM | POA: Diagnosis not present

## 2021-04-27 DIAGNOSIS — D631 Anemia in chronic kidney disease: Secondary | ICD-10-CM | POA: Diagnosis not present

## 2021-04-27 DIAGNOSIS — M6281 Muscle weakness (generalized): Secondary | ICD-10-CM | POA: Diagnosis not present

## 2021-04-27 DIAGNOSIS — N2581 Secondary hyperparathyroidism of renal origin: Secondary | ICD-10-CM | POA: Diagnosis not present

## 2021-04-27 DIAGNOSIS — L299 Pruritus, unspecified: Secondary | ICD-10-CM | POA: Diagnosis not present

## 2021-04-27 DIAGNOSIS — M6258 Muscle wasting and atrophy, not elsewhere classified, other site: Secondary | ICD-10-CM | POA: Diagnosis not present

## 2021-04-27 DIAGNOSIS — M1990 Unspecified osteoarthritis, unspecified site: Secondary | ICD-10-CM | POA: Diagnosis not present

## 2021-04-27 DIAGNOSIS — N9489 Other specified conditions associated with female genital organs and menstrual cycle: Secondary | ICD-10-CM | POA: Diagnosis not present

## 2021-04-27 DIAGNOSIS — R2689 Other abnormalities of gait and mobility: Secondary | ICD-10-CM | POA: Diagnosis not present

## 2021-04-27 DIAGNOSIS — R293 Abnormal posture: Secondary | ICD-10-CM | POA: Diagnosis not present

## 2021-04-27 DIAGNOSIS — I959 Hypotension, unspecified: Secondary | ICD-10-CM | POA: Diagnosis not present

## 2021-04-28 DIAGNOSIS — E119 Type 2 diabetes mellitus without complications: Secondary | ICD-10-CM | POA: Diagnosis not present

## 2021-04-28 DIAGNOSIS — M159 Polyosteoarthritis, unspecified: Secondary | ICD-10-CM | POA: Diagnosis not present

## 2021-04-28 DIAGNOSIS — E669 Obesity, unspecified: Secondary | ICD-10-CM | POA: Diagnosis not present

## 2021-04-28 DIAGNOSIS — I4891 Unspecified atrial fibrillation: Secondary | ICD-10-CM | POA: Diagnosis not present

## 2021-04-28 DIAGNOSIS — I509 Heart failure, unspecified: Secondary | ICD-10-CM | POA: Diagnosis not present

## 2021-04-28 DIAGNOSIS — N186 End stage renal disease: Secondary | ICD-10-CM | POA: Diagnosis not present

## 2021-04-28 DIAGNOSIS — M5459 Other low back pain: Secondary | ICD-10-CM | POA: Diagnosis not present

## 2021-04-28 DIAGNOSIS — M6281 Muscle weakness (generalized): Secondary | ICD-10-CM | POA: Diagnosis not present

## 2021-04-29 DIAGNOSIS — R4182 Altered mental status, unspecified: Secondary | ICD-10-CM | POA: Diagnosis not present

## 2021-04-29 DIAGNOSIS — R5381 Other malaise: Secondary | ICD-10-CM | POA: Diagnosis not present

## 2021-04-29 DIAGNOSIS — G479 Sleep disorder, unspecified: Secondary | ICD-10-CM | POA: Diagnosis not present

## 2021-04-29 DIAGNOSIS — N186 End stage renal disease: Secondary | ICD-10-CM | POA: Diagnosis not present

## 2021-04-29 DIAGNOSIS — I5043 Acute on chronic combined systolic (congestive) and diastolic (congestive) heart failure: Secondary | ICD-10-CM | POA: Diagnosis not present

## 2021-04-30 DIAGNOSIS — Z1159 Encounter for screening for other viral diseases: Secondary | ICD-10-CM | POA: Diagnosis not present

## 2021-04-30 DIAGNOSIS — N186 End stage renal disease: Secondary | ICD-10-CM | POA: Diagnosis not present

## 2021-04-30 DIAGNOSIS — M2550 Pain in unspecified joint: Secondary | ICD-10-CM | POA: Diagnosis not present

## 2021-04-30 DIAGNOSIS — I5042 Chronic combined systolic (congestive) and diastolic (congestive) heart failure: Secondary | ICD-10-CM | POA: Diagnosis not present

## 2021-04-30 DIAGNOSIS — M25569 Pain in unspecified knee: Secondary | ICD-10-CM | POA: Diagnosis not present

## 2021-04-30 DIAGNOSIS — M459 Ankylosing spondylitis of unspecified sites in spine: Secondary | ICD-10-CM | POA: Diagnosis not present

## 2021-04-30 DIAGNOSIS — M461 Sacroiliitis, not elsewhere classified: Secondary | ICD-10-CM | POA: Diagnosis not present

## 2021-04-30 DIAGNOSIS — M1990 Unspecified osteoarthritis, unspecified site: Secondary | ICD-10-CM | POA: Diagnosis not present

## 2021-04-30 DIAGNOSIS — M25552 Pain in left hip: Secondary | ICD-10-CM | POA: Diagnosis not present

## 2021-04-30 DIAGNOSIS — M549 Dorsalgia, unspecified: Secondary | ICD-10-CM | POA: Diagnosis not present

## 2021-04-30 DIAGNOSIS — L409 Psoriasis, unspecified: Secondary | ICD-10-CM | POA: Diagnosis not present

## 2021-04-30 DIAGNOSIS — Z79899 Other long term (current) drug therapy: Secondary | ICD-10-CM | POA: Diagnosis not present

## 2021-05-01 ENCOUNTER — Emergency Department (HOSPITAL_COMMUNITY): Payer: HMO

## 2021-05-01 ENCOUNTER — Other Ambulatory Visit: Payer: Self-pay

## 2021-05-01 ENCOUNTER — Observation Stay (HOSPITAL_COMMUNITY)
Admission: EM | Admit: 2021-05-01 | Discharge: 2021-05-02 | Disposition: A | Discharge status: Skilled Nursing Facility | Payer: HMO | Attending: Internal Medicine | Admitting: Internal Medicine

## 2021-05-01 ENCOUNTER — Encounter (HOSPITAL_COMMUNITY): Payer: Self-pay

## 2021-05-01 DIAGNOSIS — Z20822 Contact with and (suspected) exposure to covid-19: Secondary | ICD-10-CM | POA: Diagnosis not present

## 2021-05-01 DIAGNOSIS — E1121 Type 2 diabetes mellitus with diabetic nephropathy: Secondary | ICD-10-CM | POA: Diagnosis not present

## 2021-05-01 DIAGNOSIS — I959 Hypotension, unspecified: Principal | ICD-10-CM

## 2021-05-01 DIAGNOSIS — J449 Chronic obstructive pulmonary disease, unspecified: Secondary | ICD-10-CM | POA: Insufficient documentation

## 2021-05-01 DIAGNOSIS — I4891 Unspecified atrial fibrillation: Secondary | ICD-10-CM | POA: Diagnosis not present

## 2021-05-01 DIAGNOSIS — I132 Hypertensive heart and chronic kidney disease with heart failure and with stage 5 chronic kidney disease, or end stage renal disease: Secondary | ICD-10-CM | POA: Insufficient documentation

## 2021-05-01 DIAGNOSIS — Z87891 Personal history of nicotine dependence: Secondary | ICD-10-CM | POA: Diagnosis not present

## 2021-05-01 DIAGNOSIS — R079 Chest pain, unspecified: Secondary | ICD-10-CM

## 2021-05-01 DIAGNOSIS — Z992 Dependence on renal dialysis: Secondary | ICD-10-CM | POA: Diagnosis not present

## 2021-05-01 DIAGNOSIS — L89152 Pressure ulcer of sacral region, stage 2: Secondary | ICD-10-CM | POA: Diagnosis not present

## 2021-05-01 DIAGNOSIS — I517 Cardiomegaly: Secondary | ICD-10-CM | POA: Diagnosis not present

## 2021-05-01 DIAGNOSIS — E1129 Type 2 diabetes mellitus with other diabetic kidney complication: Secondary | ICD-10-CM | POA: Diagnosis not present

## 2021-05-01 DIAGNOSIS — R001 Bradycardia, unspecified: Secondary | ICD-10-CM | POA: Diagnosis not present

## 2021-05-01 DIAGNOSIS — R0789 Other chest pain: Secondary | ICD-10-CM | POA: Diagnosis not present

## 2021-05-01 DIAGNOSIS — N186 End stage renal disease: Secondary | ICD-10-CM | POA: Diagnosis not present

## 2021-05-01 DIAGNOSIS — Z794 Long term (current) use of insulin: Secondary | ICD-10-CM

## 2021-05-01 DIAGNOSIS — I5042 Chronic combined systolic (congestive) and diastolic (congestive) heart failure: Secondary | ICD-10-CM | POA: Insufficient documentation

## 2021-05-01 DIAGNOSIS — I480 Paroxysmal atrial fibrillation: Secondary | ICD-10-CM

## 2021-05-01 DIAGNOSIS — J9811 Atelectasis: Secondary | ICD-10-CM | POA: Diagnosis not present

## 2021-05-01 DIAGNOSIS — K219 Gastro-esophageal reflux disease without esophagitis: Secondary | ICD-10-CM

## 2021-05-01 DIAGNOSIS — Z7984 Long term (current) use of oral hypoglycemic drugs: Secondary | ICD-10-CM | POA: Diagnosis not present

## 2021-05-01 DIAGNOSIS — Z7901 Long term (current) use of anticoagulants: Secondary | ICD-10-CM | POA: Diagnosis not present

## 2021-05-01 DIAGNOSIS — L8990 Pressure ulcer of unspecified site, unspecified stage: Secondary | ICD-10-CM | POA: Diagnosis present

## 2021-05-01 DIAGNOSIS — E1122 Type 2 diabetes mellitus with diabetic chronic kidney disease: Secondary | ICD-10-CM

## 2021-05-01 DIAGNOSIS — J45909 Unspecified asthma, uncomplicated: Secondary | ICD-10-CM | POA: Insufficient documentation

## 2021-05-01 DIAGNOSIS — R110 Nausea: Secondary | ICD-10-CM | POA: Diagnosis not present

## 2021-05-01 DIAGNOSIS — Z955 Presence of coronary angioplasty implant and graft: Secondary | ICD-10-CM | POA: Diagnosis not present

## 2021-05-01 LAB — RESP PANEL BY RT-PCR (FLU A&B, COVID) ARPGX2
Influenza A by PCR: NEGATIVE
Influenza B by PCR: NEGATIVE
SARS Coronavirus 2 by RT PCR: NEGATIVE

## 2021-05-01 LAB — CBC
HCT: 38.1 % (ref 36.0–46.0)
Hemoglobin: 12.2 g/dL (ref 12.0–15.0)
MCH: 31 pg (ref 26.0–34.0)
MCHC: 32 g/dL (ref 30.0–36.0)
MCV: 96.7 fL (ref 80.0–100.0)
Platelets: 269 10*3/uL (ref 150–400)
RBC: 3.94 MIL/uL (ref 3.87–5.11)
RDW: 15.8 % — ABNORMAL HIGH (ref 11.5–15.5)
WBC: 12.3 10*3/uL — ABNORMAL HIGH (ref 4.0–10.5)
nRBC: 0 % (ref 0.0–0.2)

## 2021-05-01 LAB — BASIC METABOLIC PANEL
Anion gap: 15 (ref 5–15)
BUN: 19 mg/dL (ref 8–23)
CO2: 28 mmol/L (ref 22–32)
Calcium: 8.6 mg/dL — ABNORMAL LOW (ref 8.9–10.3)
Chloride: 96 mmol/L — ABNORMAL LOW (ref 98–111)
Creatinine, Ser: 4.88 mg/dL — ABNORMAL HIGH (ref 0.44–1.00)
GFR, Estimated: 9 mL/min — ABNORMAL LOW (ref >60)
Glucose, Bld: 218 mg/dL — ABNORMAL HIGH (ref 70–99)
Potassium: 4 mmol/L (ref 3.5–5.1)
Sodium: 139 mmol/L (ref 135–145)

## 2021-05-01 LAB — TROPONIN I (HIGH SENSITIVITY)
Troponin I (High Sensitivity): 56 ng/L — ABNORMAL HIGH (ref <18)
Troponin I (High Sensitivity): 52 ng/L — ABNORMAL HIGH (ref <18)

## 2021-05-01 LAB — LACTIC ACID, PLASMA
Lactic Acid, Venous: 1.9 mmol/L (ref 0.5–1.9)
Lactic Acid, Venous: 1.7 mmol/L (ref 0.5–1.9)

## 2021-05-01 LAB — CBG MONITORING, ED: Glucose-Capillary: 74 mg/dL (ref 70–99)

## 2021-05-01 MED ORDER — MOMETASONE FURO-FORMOTEROL FUM 200-5 MCG/ACT IN AERO
2.0000 | INHALATION_SPRAY | Freq: Two times a day (BID) | RESPIRATORY_TRACT | Status: DC
Start: 2021-05-01 — End: 2021-05-01

## 2021-05-01 MED ORDER — ADULT MULTIVITAMIN W/MINERALS CH
ORAL_TABLET | Freq: Every day | ORAL | Status: DC
  Administered 2021-05-01 – 2021-05-02 (×2): 1 via ORAL
  Filled 2021-05-01 (×2): qty 1

## 2021-05-01 MED ORDER — AMIODARONE HCL 200 MG PO TABS
200.0000 mg | ORAL_TABLET | Freq: Every day | ORAL | Status: DC
Start: 2021-05-01 — End: 2021-05-01

## 2021-05-01 MED ORDER — ALUM & MAG HYDROXIDE-SIMETH 200-200-20 MG/5ML PO SUSP
30.0000 mL | Freq: Once | ORAL | Status: AC
  Administered 2021-05-01: 30 mL via ORAL
  Filled 2021-05-01: qty 30

## 2021-05-01 MED ORDER — AMIODARONE HCL 200 MG PO TABS: 200.0000 mg | ORAL_TABLET | Freq: Every day | ORAL | Status: DC

## 2021-05-01 MED ORDER — DIPHENHYDRAMINE HCL (SLEEP) 25 MG PO TABS: 25.0000 mg | ORAL_TABLET | Freq: Four times a day (QID) | ORAL | Status: DC | PRN

## 2021-05-01 MED ORDER — TRAMADOL HCL 50 MG PO TABS
50.0000 mg | ORAL_TABLET | Freq: Four times a day (QID) | ORAL | Status: DC | PRN
  Administered 2021-05-02: 50 mg via ORAL
  Filled 2021-05-01: qty 1

## 2021-05-01 MED ORDER — LIDOCAINE 5 % EX PTCH
1.0000 | MEDICATED_PATCH | Freq: Every day | CUTANEOUS | Status: DC
  Administered 2021-05-01 – 2021-05-02 (×2): 1 via TRANSDERMAL
  Filled 2021-05-01 (×2): qty 1

## 2021-05-01 MED ORDER — INSULIN DEGLUDEC 100 UNIT/ML ~~LOC~~ SOPN
22.0000 [IU] | PEN_INJECTOR | Freq: Every day | SUBCUTANEOUS | Status: DC
  Filled 2021-05-01: qty 3

## 2021-05-01 MED ORDER — LIDOCAINE VISCOUS HCL 2 % MT SOLN
15.0000 mL | Freq: Once | OROMUCOSAL | Status: AC
  Administered 2021-05-01: 15 mL via ORAL
  Filled 2021-05-01: qty 15

## 2021-05-01 MED ORDER — LATANOPROST 0.005 % OP SOLN
1.0000 [drp] | Freq: Every day | OPHTHALMIC | Status: DC
  Filled 2021-05-01: qty 2.5

## 2021-05-01 MED ORDER — PANTOPRAZOLE SODIUM 40 MG PO TBEC
40.0000 mg | DELAYED_RELEASE_TABLET | Freq: Every day | ORAL | Status: DC
  Administered 2021-05-01: 40 mg via ORAL
  Filled 2021-05-01: qty 1

## 2021-05-01 MED ORDER — APIXABAN 5 MG PO TABS
5.0000 mg | ORAL_TABLET | Freq: Two times a day (BID) | ORAL | Status: DC
  Administered 2021-05-01 – 2021-05-02 (×2): 5 mg via ORAL
  Filled 2021-05-01 (×2): qty 1

## 2021-05-01 MED ORDER — DIPHENHYDRAMINE HCL 25 MG PO CAPS: 25.0000 mg | ORAL_CAPSULE | Freq: Four times a day (QID) | ORAL | Status: DC | PRN

## 2021-05-01 MED ORDER — INSULIN GLARGINE-YFGN 100 UNIT/ML ~~LOC~~ SOLN
22.0000 [IU] | Freq: Every day | SUBCUTANEOUS | Status: DC
  Filled 2021-05-01 (×2): qty 0.22

## 2021-05-01 MED ORDER — SODIUM CHLORIDE 0.9 % IV BOLUS
1000.0000 mL | Freq: Once | INTRAVENOUS | Status: AC
  Administered 2021-05-01: 1000 mL via INTRAVENOUS

## 2021-05-01 MED ORDER — MIDODRINE HCL 5 MG PO TABS
10.0000 mg | ORAL_TABLET | Freq: Three times a day (TID) | ORAL | Status: DC
  Administered 2021-05-01 – 2021-05-02 (×3): 10 mg via ORAL
  Filled 2021-05-01 (×3): qty 2

## 2021-05-01 MED ORDER — TRAZODONE HCL 50 MG PO TABS
25.0000 mg | ORAL_TABLET | Freq: Every day | ORAL | Status: DC
Start: 2021-05-01 — End: 2021-05-03
  Administered 2021-05-01: 25 mg via ORAL
  Filled 2021-05-01: qty 1

## 2021-05-01 MED ORDER — SUCROFERRIC OXYHYDROXIDE 500 MG PO CHEW
500.0000 mg | CHEWABLE_TABLET | Freq: Three times a day (TID) | ORAL | Status: DC
  Administered 2021-05-02 (×2): 500 mg via ORAL
  Filled 2021-05-01 (×5): qty 1

## 2021-05-01 MED ORDER — ONDANSETRON HCL 4 MG/2ML IJ SOLN: 4.0000 mg | Freq: Four times a day (QID) | INTRAMUSCULAR | Status: DC | PRN

## 2021-05-01 MED ORDER — ALBUTEROL SULFATE (2.5 MG/3ML) 0.083% IN NEBU: 3.0000 mL | INHALATION_SOLUTION | RESPIRATORY_TRACT | Status: DC | PRN

## 2021-05-01 MED ORDER — ACETAMINOPHEN 325 MG PO TABS: 650.0000 mg | ORAL_TABLET | ORAL | Status: DC | PRN

## 2021-05-01 NOTE — ED Notes (Signed)
Pharmacy tech at bedside 

## 2021-05-01 NOTE — Assessment & Plan Note (Addendum)
Received sliding scale insulin, Accu-Cheks during hospitalization.  Resume home insulin regimen on discharge. ?

## 2021-05-01 NOTE — ED Notes (Signed)
Admit provider at bedside 

## 2021-05-01 NOTE — Assessment & Plan Note (Addendum)
Patient had undergone dialysis on Friday.  Resume hemodialysis after discharge. ?

## 2021-05-01 NOTE — Assessment & Plan Note (Addendum)
Currently volume compensated.  Patient goes for dialysis as outpatient. ?

## 2021-05-01 NOTE — ED Notes (Signed)
Contacted floor about purple man. Advised charge nurse was looking at it  now ?

## 2021-05-01 NOTE — Assessment & Plan Note (Addendum)
Continue midodrine.  Blood pressure has improved at this time. ?

## 2021-05-01 NOTE — ED Provider Notes (Cosign Needed)
[place] EMERGENCY DEPARTMENT Provider Note   CSN: [id number] Arrival date & time: 05/01/21  1531     History  Chief Complaint  Patient presents with   Chest Pain   Hypotension     Stephanie Woodward is a 81 y.o. female with a PMHx of type 2 diabetes, CHF, GEN, ESRD on hemodialysis (Monday Wednesday Friday), COPD, PAF (on Eliquis), who presents to the Emergency Department complaining of sternal CP onset today.  Patient notes that she was in dialysis and 2 hours into her treatment when she began to have sternal chest pain.  Her chest pain lasted for approximately 30 minutes.  Per EMS patient was given 250 mL normal saline bolus due to being hypotensive at 78/36.  Following treatment by EMS her blood pressure improved to 104/40.  Patient denies abdominal pain, cough, dizziness, fever, headache, nausea, shortness of breath, vomiting.  Denies recent surgery.  Denies prior MI.  Patient notes that she has had 2 cardiac catheterizations in the past with stent placement.  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  Echocardiogram on 02/19/2021 noted LV ejection fraction of 25-30%.   The history is provided by the patient. No language interpreter was used.      Home Medications Prior to Admission medications   Medication Sig Start Date End Date Taking? Authorizing Provider  acetaminophen (TYLENOL) 500 MG tablet Take 1,000 mg by mouth daily.   Yes [provider]  albuterol (PROVENTIL HFA;VENTOLIN HFA) 108 (90 BASE) MCG/ACT inhaler Inhale 2 puffs into the lungs in the morning and at bedtime.   Yes [provider]  amiodarone (PACERONE) 200 MG tablet Take 200 mg by mouth daily.   Yes [provider]  atorvastatin (LIPITOR) 80 MG tablet Take 80 mg by mouth at bedtime.   Yes [provider]  budesonide-formoterol (SYMBICORT) 160-4.5 MCG/ACT  inhaler Inhale 2 puffs into the lungs 2 (two) times daily. 04/09/20  Yes Collene Gobble, MD  diphenhydrAMINE (BENADRYL) 25 mg capsule Take 25 mg by mouth every 6 (six) hours as needed for itching.   Yes [provider]  ELIQUIS 5 MG TABS tablet Take 1 tablet by mouth twice daily Patient taking differently: Take 5 mg by mouth 2 (two) times daily. 07/23/20  Yes Larey Dresser, MD  insulin aspart (NOVOLOG) 100 UNIT/ML injection CBG 70 - 120: 0 units CBG 121 - 150: 1 unit CBG 151 - 200: 2 units CBG 201 - 250: 3 units CBG 251 - 300: 5 units CBG 301 - 350: 7 units CBG 351 - 400: 9 units Patient taking differently: Inject 0-12 Units into the skin See admin instructions. Per sliding scale bid 70-200 = 0 units 201-250 = 2 units 251-300 = 4 units 301-350 = 6 units 351-400 = 8 units 401-450 = 10 units 451-600 = 12 units 03/05/21  Yes Hosie Poisson, MD  insulin degludec (TRESIBA FLEXTOUCH) 100 UNIT/ML FlexTouch Pen Inject 18 Units into the skin daily. Patient taking differently: Inject 22 Units into the skin daily. 02/06/20  Yes Shamleffer, Melanie Crazier, MD  ketoconazole (NIZORAL) 2 % cream Apply 1 application topically daily. 02/16/21  Yes [provider]  latanoprost (XALATAN) 0.005 % ophthalmic solution Place 1 drop into both eyes at bedtime.   Yes [provider]  lidocaine (LINDAMANTLE) 3 % CREA cream Apply 1 application topically every Monday, Wednesday, and Friday. Prior to dialysis 03/06/20  Yes  [provider]  Lidocaine 4 % PTCH Place 1 patch onto the skin daily. 11/12/20  Yes [provider]  Menthol, Topical Analgesic, (BIOFREEZE) 4 % GEL Apply 1 application. topically in the morning, at noon, and at bedtime.   Yes [provider]  midodrine (PROAMATINE) 10 MG tablet Take 10 mg by mouth 3 (three) times daily. Hold if SBP>130   Yes [provider]  Multiple Vitamin (MULTI-VITAMIN PO) Take 1 tablet by mouth daily.   Yes [provider]  Nutritional Supplements (FEEDING SUPPLEMENT, NEPRO CARB STEADY,) LIQD Take 237 mLs by mouth 3 (three) times daily with meals. 03/05/21 06/03/21 Yes Hosie Poisson, MD  pantoprazole (PROTONIX) 40 MG tablet Take 40 mg by mouth at bedtime. 10/26/18  Yes [provider]  sucroferric oxyhydroxide (VELPHORO) 500 MG chewable tablet Chew 500 mg by mouth 3 (three) times daily with meals.   Yes [provider]  traZODone (DESYREL) 50 MG tablet Take 25 mg by mouth at bedtime.   Yes [provider]  Blood Glucose Monitoring Suppl (FREESTYLE LITE) DEVI Use as instructed to check blood sugar 4 times daily Patient taking differently: 1 each by Other route See admin instructions. Use as instructed to check blood sugar 4 times daily 12/29/20   Shamleffer, Melanie Crazier, MD  Continuous Blood Gluc Receiver (FREESTYLE LIBRE READER) DEVI 1 kit by Does not apply route daily. Use as instructed E11.65 09/12/19   Shamleffer, Melanie Crazier, MD  glucose blood (FREESTYLE LITE) test strip USE AS DIRECTED 4 TIMES DAILY Patient taking differently: 1 each by Other route See admin instructions. USE AS DIRECTED 4 TIMES DAILY 02/02/21   Shamleffer, Melanie Crazier, MD  Insulin Pen Needle 32G X 4 MM MISC 1 Device by Does not apply route in the morning, at noon, in the evening, and at bedtime. 02/06/20   Shamleffer, Melanie Crazier, MD  multivitamin (RENA-VIT) TABS tablet Take 1 tablet by mouth at bedtime. Patient not taking: Reported on 04/25/2021 03/05/21   Hosie Poisson, MD  Pramoxine-Calamine (AVEENO ANTI-ITCH EX) Apply 1 application topically every 6 (six) hours as needed (itching).    [provider]  senna-docusate (SENOKOT-S) 8.6-50 MG tablet Take 1 tablet by mouth at bedtime as needed for mild constipation. Patient not taking: Reported on 04/25/2021 03/05/21   Hosie Poisson, MD      Allergies    Eggs or egg-derived products, Lisinopril, Penicillins, and Other    Review of Systems    Review of Systems  Constitutional:  Negative for chills and fever.  Eyes:  Negative for visual disturbance.  Respiratory:  Negative for cough and shortness of breath.   Cardiovascular:  Positive for chest pain.  Gastrointestinal:  Negative for abdominal pain, nausea and vomiting.  Genitourinary:  Negative for dysuria and hematuria.  Neurological:  Negative for dizziness, syncope, light-headedness and headaches.  All other systems reviewed and are negative.  Physical Exam Updated Vital Signs BP (!) 107/40    Pulse (!) 54    Temp 97.6 F (36.4 C) (Oral)    Resp 18    Ht '5\' 2"'  (1.575 m)    Wt 85.3 kg    SpO2 93%    BMI 34.40 kg/m  Physical Exam Vitals and nursing note reviewed.  Constitutional:      General: She is not in acute distress.    Appearance: She is not diaphoretic.  HENT:     Head: Normocephalic and atraumatic.     Mouth/Throat:  Pharynx: No oropharyngeal exudate.  Eyes:     General: No scleral icterus.    Conjunctiva/sclera: Conjunctivae normal.  Cardiovascular:     Rate and Rhythm: Normal rate and regular rhythm.     Pulses: Normal pulses.     Heart sounds: Normal heart sounds.  Pulmonary:     Effort: Pulmonary effort is normal. No respiratory distress.     Breath sounds: Normal breath sounds. No wheezing.  Chest:     Chest wall: No tenderness.     Comments: No chest wall TTP Abdominal:     General: Bowel sounds are normal.     Palpations: Abdomen is soft. There is no mass.     Tenderness: There is no abdominal tenderness. There is no guarding or rebound.  Musculoskeletal:        General: Normal range of motion.     Cervical back: Normal range of motion and neck supple.     Comments: No C, T, L, S, spinal tenderness to palpation.  No overlying skin changes.  Skin:    General: Skin is warm and dry.  Neurological:     Mental Status: She is alert.  Psychiatric:        Behavior: Behavior normal.    ED Results / Procedures / Treatments   Labs (all labs  ordered are listed, but only abnormal results are displayed) Labs Reviewed  BASIC METABOLIC PANEL - Abnormal; Notable for the following components:      Result Value   Chloride 96 (*)    Glucose, Bld 218 (*)    Creatinine, Ser 4.88 (*)    Calcium 8.6 (*)    GFR, Estimated 9 (*)    All other components within normal limits  CBC - Abnormal; Notable for the following components:   WBC 12.3 (*)    RDW 15.8 (*)    All other components within normal limits  TROPONIN I (HIGH SENSITIVITY) - Abnormal; Notable for the following components:   Troponin I (High Sensitivity) 56 (*)    All other components within normal limits  TROPONIN I (HIGH SENSITIVITY) - Abnormal; Notable for the following components:   Troponin I (High Sensitivity) 52 (*)    All other components within normal limits  RESP PANEL BY RT-PCR (FLU A&B, COVID) ARPGX2  CULTURE, BLOOD (ROUTINE X 2)  CULTURE, BLOOD (ROUTINE X 2)  LACTIC ACID, PLASMA  LACTIC ACID, PLASMA  LIPID PANEL  CBG MONITORING, ED    EKG EKG Interpretation  Date/Time:  Friday May 01 2021 15:35:24 EST Ventricular Rate:  65 PR Interval:  183 QRS Duration: 135 QT Interval:  513 QTC Calculation: 534 R Axis:   270 Text Interpretation: Sinus rhythm Nonspecific IVCD with LAD Anterior infarct, old ST elevation, consider inferior injury Confirmed by Thamas Jaegers ([id number]) on 05/01/2021 4:01:47 PM  Radiology DG Chest Port 1 View  Result Date: 05/01/2021 CLINICAL DATA:  Chest pain, shortness of breath, nausea, end-stage renal disease, was at dialysis and was hypotensive with onset of symptoms EXAM: PORTABLE CHEST 1 VIEW COMPARISON:  Portable exam 1649 hours compared to 02/22/2021 FINDINGS: Enlargement of cardiac silhouette with pulmonary vascular congestion. Mediastinal contours normal. Subsegmental atelectasis at both lung bases. Question minimal perihilar edema. No gross pleural effusion or pneumothorax. IMPRESSION: Enlargement of cardiac silhouette with  pulmonary vascular congestion and question minimal perihilar edema. Bibasilar atelectasis. Electronically Signed   By: Lavonia Dana M.D.   On: 05/01/2021 17:09    Procedures .Critical Care E&M Performed by: Steva Colder  A, PA-C  Critical care provider statement:    Critical care time was exclusive of:  Separately billable procedures and treating other patients   Critical care was necessary to treat or prevent imminent or life-threatening deterioration of the following conditions:  Cardiac failure, circulatory failure, renal failure and dehydration   Critical care was time spent personally by me on the following activities:  Development of treatment plan with patient or surrogate, ordering and performing treatments and interventions, ordering and review of laboratory studies, ordering and review of radiographic studies, pulse oximetry, evaluation of patient's response to treatment, re-evaluation of patient's condition, review of old charts, obtaining history from patient or surrogate and discussions with consultants   I assumed direction of critical care for this patient from another provider in my specialty: no   After initial E/M assessment, critical care services were subsequently performed that were exclusive of separately billable procedures or treatment.      Medications Ordered in ED Medications  traMADol (ULTRAM) tablet 50 mg (has no administration in time range)  midodrine (PROAMATINE) tablet 10 mg (10 mg Oral Given 05/01/21 2228)  traZODone (DESYREL) tablet 25 mg (25 mg Oral Given 05/01/21 2229)  pantoprazole (PROTONIX) EC tablet 40 mg (40 mg Oral Given 05/01/21 2229)  sucroferric oxyhydroxide (VELPHORO) chewable tablet 500 mg (has no administration in time range)  apixaban (ELIQUIS) tablet 5 mg (has no administration in time range)  multivitamin with minerals tablet (has no administration in time range)  albuterol (PROVENTIL) (2.5 MG/3ML) 0.083% nebulizer solution 3 mL (has no  administration in time range)  latanoprost (XALATAN) 0.005 % ophthalmic solution 1 drop (has no administration in time range)  lidocaine (LIDODERM) 5 % 1 patch (has no administration in time range)  acetaminophen (TYLENOL) tablet 650 mg (has no administration in time range)  ondansetron (ZOFRAN) injection 4 mg (has no administration in time range)  amiodarone (PACERONE) tablet 200 mg (has no administration in time range)  diphenhydrAMINE (BENADRYL) capsule 25 mg (has no administration in time range)  insulin glargine-yfgn (SEMGLEE) injection 22 Units (has no administration in time range)  sodium chloride 0.9 % bolus 1,000 mL (0 mLs Intravenous Stopped 05/01/21 2113)  alum & mag hydroxide-simeth (MAALOX/MYLANTA) 200-200-20 MG/5ML suspension 30 mL (30 mLs Oral Given 05/01/21 2229)    And  lidocaine (XYLOCAINE) 2 % viscous mouth solution 15 mL (15 mLs Oral Given 05/01/21 2229)    ED Course/ Medical Decision Making/ A&P Clinical Course as of 05/01/21 2339  Fri May 01, 2021  1552 Chest pain sternal 30 mins during dialysis. No shortness of breath. Fall yesterday fell backwards and hit the back of her head. No loc.  [SB]  2505 Pt re-evaluated and blood pressure noted to improve to 397 systolically. [SB]  1716 WBC(!): 12.3 [SB]  1745 Lactic Acid, Venous: 1.9 [SB]  1755 Troponin I (High Sensitivity)(!): 56 [SB]  1844 Evaluation with patient daughter who notes that patient takes midodrine taken on MWF prior to her dialysis treatment. [SB]  6734 Consult with cardiologist, Dr. Gardiner Rhyme who notes that they will see the patient in consult. [SB]  1850 Discussed with patient and daughter at bedside regarding admission plans.  Patient resting comfortably on the stretcher.  Daughter agreeable with admission plans at this time. [SB]  1925 Consult with hospitalist, Dr. Sidney Ace who agrees with observation. [SB]    Clinical Course User Index [SB] Chamberlain Steinborn A, PA-C  Medical Decision  Making Amount and/or Complexity of Data Reviewed Labs: ordered. Decision-making details documented in ED Course. Radiology: ordered.  Risk Decision regarding hospitalization.   Patient presents to the ED with sternal chest pain onset today.  Patient notes that she was 2 hours into her dialysis treatment when she began to have sternal chest pain that lasted for approximately 30 minutes.  Patient was brought in by EMS and was given 250 mL normal saline bolus due to being hypotensive at 78/36.  Initial blood pressure upon arrival to the ED was 104/40.  Patient denies prior MI.  Patient has had 2 cardiac catheterizations in the past.  On exam patient without acute cardiovascular, respiratory, abdominal exam findings.  Differential diagnosis includes ACS, pneumothorax, PE, pneumonia.   Additional history obtained:  Additional history obtained from Daughter/Son, who helps provide additional information during initial history and physical. 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  Echocardiogram on 02/19/2021 noted LV ejection fraction of 25-30%.  EKG:  Sinus rhythm, no acute ST/T changes.  Labs:  I ordered, and personally interpreted labs.  The pertinent results include:   Initial troponin at 56, repeat troponin at 52 CBC with leukocytosis at 12.3 otherwise unremarkable.  BMP with elevated glucose at 218, creatinine 4.88, GFR 9 Initial lactic at 1.9 with repeat at 1.7.   COVID and flu swab negative Blood cultures ordered with results pending  Imaging: I ordered imaging studies including CXR I independently visualized and interpreted imaging which showed:  Enlargement of cardiac silhouette with pulmonary vascular congestion  and question minimal perihilar edema.     Bibasilar atelectasis.   I agree with the radiologist  interpretation  Medications:  I ordered medication including IV fluids for hypotension Reevaluation of the patient after these medicines and interventions, I reevaluated the patient and found that they have improved Blood pressure after IV fluids improved to 131/43. I have reviewed the patients home medicines and have made adjustments as needed  Critical Interventions IV fluids   Consultations: I requested consultation with the Cardiologist, Dr. Gardiner Rhyme and discussed lab and imaging findings as well as pertinent plan - they recommend: Admission to the hospital with medicine and they will see the patient in consult.   Consult with hospitalist, Dr. Gayla Medicus who agrees with observation in the hospital and cardiology consult inpatient.     Disposition: Patient presentation suspicious for symptomatic hypotension and chest pain.  At this time doubt ACS, troponin likely elevated due to demand.  No pneumothorax or pneumonia noted on x-ray.  EKG without acute ST/T changes. Chest x-ray without acute findings, vital signs stable, doubt pneumonia or pneumothorax at this time. Concern for cardiac etiology of chest pain and further evaluation is recommended.  Patient reevaluated prior to consult and vital signs stable, no acute distress, patient resting comfortably on stretcher. After consideration of the diagnostic results and the patients response to treatment, I feel that the patient would benefit from Admission to the hospital.  This case was discussed with attending who agrees with plans of admission. Discussed admission plans with patient at bedside. Answered all available questions. Pt agreeable to admission at this time. Pt appears safe for admission.    This chart was dictated using voice recognition software, Dragon. Despite the best efforts of this provider to proofread and correct errors, errors may still occur which can change documentation meaning.  Final Clinical Impression(s) /  ED  Diagnoses Final diagnoses:  Hypotension, unspecified hypotension type    Rx / DC Orders ED Discharge Orders     None         Rise Traeger A, PA-C 05/01/21 2341

## 2021-05-01 NOTE — ED Triage Notes (Signed)
Pt bib [facility] from Dialysis where she was 2 hours into her treatment went she started having cp and felt nauseous. Pt blood pressure with this episode was 78/36, Pt given 290m NS bolus with blood pressure being 104/40 upon ems arrival. Pt arrives denying cp/shob, but complains of back pain. ?

## 2021-05-01 NOTE — ED Notes (Signed)
Received verbal report from [facility] at this time ?

## 2021-05-01 NOTE — Assessment & Plan Note (Addendum)
Slightly elevated troponin but the EKG without acute changes.  Continue aspirin, sublingual nitroglycerin.   Cardiology saw the the patient during hospitalization.  At this time no chest pain..  Elevated troponin thought to be secondary to demand ischemia.  2D echocardiogram from 05/02/2021 with improved LV function compared to 2D echocardiogram from 2022. ?

## 2021-05-01 NOTE — Assessment & Plan Note (Addendum)
Continue Protonix °

## 2021-05-01 NOTE — Assessment & Plan Note (Addendum)
Continue albuterol, Symbicort from home. ?

## 2021-05-01 NOTE — Assessment & Plan Note (Addendum)
Continue amiodarone and Eliquis.  Remained rate controlled. ?

## 2021-05-01 NOTE — Consult Note (Signed)
Cardiology Consultation:   Patient ID: Stephanie Woodward MRN: [id number]; DOB: [birth year]  Admit date: 05/01/2021 Date of Consult: 05/01/2021  PCP:  Alroy Dust, L.Marlou Sa, [place] Providers Cardiologist:  Loralie Champagne, MD        Patient Profile:   Stephanie Woodward is a 81 y.o. female with a hx of ESRD, persistent atrial fibrillation, chronic combined systolic and diastolic heart failure who is being seen 05/01/2021 for the evaluation of chest pain/elevated troponin at the request of Dr. Sidney Ace  History of Present Illness:   Stephanie Woodward is a 81 year old female with history of ESRD, persistent atrial fibrillation, chronic combined systolic and diastolic heart failure who we are consulted for evaluation of elevated troponin.  She was admitted in December 2022 with A-fib with RVR.  She was cardioverted in the ED and started on amiodarone and Toprol-XL.  He was subsequently found unresponsive, required intubation and transferred to ICU.  Code stroke was called, MRI brain showed no acute process.  Echocardiogram at that time showed EF 30 to 35%.  LHC showed no obstructive CAD.  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  She went back into A-fib and was cardioverted 03/03/2021 and again 03/12/2021.  She was last seen in advanced heart failure clinic on 04/09/2021.  There was no BP room for GDMT, she has required midodrine 10 mg 3 times daily due to to hypotension during dialysis.  She presents today with chest pain that occurred when she was in dialysis.  Reports chest pain lasted for about 5 minutes.  Describes sharp pain in center of chest.  BP was down to 78/36.  She received 250 cc bolus.  BP improved to 104/40.  On presentation to the ED, BP as low as 83/45.  She received additional IV fluids and BP improved, most recently 115/48.  Other vital signs notable for  SPO2 100%, pulse 57.  EKG shows sinus rhythm, rate 65, poor R wave progression, nonspecific intraventricular conduction delay, left axis deviation.  Chest x-ray with pulmonary vascular congestion and possible minimal perihilar edema.  Labs notable for creatinine 4.88, lactate 1.9, WBC 12.3, platelets 269, hemoglobin 12.2, troponin 56 > 52.   Past Medical History:  Diagnosis Date   Arthritis    Asthma    Atrial fibrillation (HCC)    CHF (congestive heart failure) ([place])    CKD (chronic kidney disease), stage III (HCC)    COPD (chronic obstructive pulmonary disease) (HCC)    Diabetes mellitus    INSULIN DEPENDENT   Gout    Heart murmur    no issues per pt   History of kidney stones    Hyperlipemia    Hypertension    Psoriasis    Renal disorder    congenital   Single kidney    Sleep apnea    doesn't use the Cpap    Past Surgical History:  Procedure Laterality Date   A/V FISTULAGRAM Left 10/31/2020   Procedure: A/V FISTULAGRAM;  Surgeon: Angelia Mould, MD;  Location: [facility];  Service: Cardiovascular;  Laterality: Left;   ABDOMINAL HYSTERECTOMY     AV FISTULA PLACEMENT Left 11/17/2018   Procedure: BRACHIO-CEPHALIC ARTERIOVENOUS (AV) FISTULA CREATION IN LEFT ARM;  Surgeon: Marty Heck, MD;  Location: [facility];  Service: Vascular;  Laterality: Left;   CARDIOVERSION N/A 03/03/2021   Procedure: CARDIOVERSION;  Surgeon: Larey Dresser, MD;  Location: [facility];  Service: Cardiovascular;  Laterality: N/A;   CARDIOVERSION N/A 03/12/2021   Procedure: CARDIOVERSION;  Surgeon: Larey Dresser, MD;  Location: [facility] ENDOSCOPY;  Service: Cardiovascular;  Laterality: N/A;   CHOLECYSTECTOMY     INSERTION OF DIALYSIS CATHETER Right 01/26/2019   Procedure: INSERTION OF DIALYSIS CATHETER, right internal jugular;  Surgeon: Angelia Mould, MD;  Location: [facility];  Service: Vascular;  Laterality: Right;   LEFT AND RIGHT HEART CATHETERIZATION WITH CORONARY ANGIOGRAM N/A  11/29/2013   Procedure: LEFT AND RIGHT HEART CATHETERIZATION WITH CORONARY ANGIOGRAM;  Surgeon: Jacolyn Reedy, MD;  Location: [facility] CATH LAB;  Service: Cardiovascular;  Laterality: N/A;   LEFT HEART CATH AND CORONARY ANGIOGRAPHY N/A 02/24/2021   Procedure: LEFT HEART CATH AND CORONARY ANGIOGRAPHY;  Surgeon: Larey Dresser, MD;  Location: [place] [facility];  Service: Cardiovascular;  Laterality: N/A;   PERIPHERAL VASCULAR INTERVENTION Left 10/31/2020   Procedure: PERIPHERAL VASCULAR INTERVENTION;  Surgeon: Angelia Mould, MD;  Location: [facility] [facility];  Service: Cardiovascular;  Laterality: Left;   RIGHT HEART CATH N/A 11/23/2017   Procedure: RIGHT HEART CATH;  Surgeon: Larey Dresser, MD;  Location: [place] [facility];  Service: Cardiovascular;  Laterality: N/A;   RIGHT/LEFT HEART CATH AND CORONARY ANGIOGRAPHY N/A 06/15/2019   Procedure: RIGHT/LEFT HEART CATH AND CORONARY ANGIOGRAPHY;  Surgeon: Larey Dresser, MD;  Location: [facility];  Service: Cardiovascular;  Laterality: N/A;       Inpatient Medications: Scheduled Meds:  Continuous Infusions:  PRN Meds:   Allergies:    Allergies  Allergen Reactions   Eggs Or Egg-Derived Products Nausea And Vomiting   Lisinopril Nausea And Vomiting   Penicillins Nausea And Vomiting    Has patient had a PCN reaction causing immediate rash, facial/tongue/throat swelling, SOB or lightheadedness with hypotension: No Has patient had a PCN reaction causing severe rash involving mucus membranes or skin necrosis: No Has patient had a PCN reaction that required hospitalization: No Has patient had a PCN reaction occurring within the last 10 years: No If all of the above answers are "NO", then may proceed with Cephalosporin use.    Other Other (See Comments)    Social History:   Social History   Socioeconomic History   Marital status: Widowed    Spouse name: Not on file   Number of children: 2   Years of education: Not on file    Highest education level: Not on file  Occupational History   Occupation: retired  Tobacco Use   Smoking status: Former    Packs/day: 1.00    Years: 20.00    Pack years: 20.00    Types: Cigarettes    Quit date: 02/22/2009    Years since quitting: 12.1   Smokeless tobacco: Never  Vaping Use   Vaping Use: Never used  Substance and Sexual Activity   Alcohol use: Yes    Alcohol/week: 0.0 standard drinks    Comment: occasional beer   Drug use: Not Currently    Comment: marijuana in the past   Sexual activity: Never  Other Topics Concern   Not on file  Social History Narrative   Not on file   Social Determinants of Health   Financial Resource Strain: Low Risk    Difficulty of Paying Living Expenses: Not very hard  Food Insecurity: No Food Insecurity   Worried About Running Out of Food in the Last Year: Never true  Ran Out of Food in the Last Year: Never true  Transportation Needs: No Transportation Needs   Lack of Transportation (Medical): No   Lack of Transportation (Non-Medical): No  Physical Activity: Not on file  Stress: Not on file  Social Connections: Not on file  Intimate Partner Violence: Not on file    Family History:    Family History  Problem Relation Age of Onset   Lupus Daughter    Cancer Mother 83       type unknown   CVA Father 75   Cancer Brother 67       type unknown     ROS:  Please see the history of present illness.   All other ROS reviewed and negative.     Physical Exam/Data:   Vitals:   05/01/21 1730 05/01/21 1745 05/01/21 1815 05/01/21 1830  BP: (!) 125/48 (!) 117/47 (!) 114/48 (!) 131/43  Pulse: (!) 56 (!) 55 (!) 54 (!) 53  Resp: '19 10 13 14  '$ Temp:      TempSrc:      SpO2: 94% 91% 92% 93%  Weight:      Height:       No intake or output data in the 24 hours ending 05/01/21 1857 Last 3 Weights 05/01/2021 04/25/2021 04/20/2021  Weight (lbs) 188 lb 0.8 oz 188 lb 188 lb  Weight (kg) 85.3 kg 85.276 kg 85.276 kg     Body mass index  is 34.4 kg/m.  General:  n no acute distress HEENT: normal Neck: no JVD Cardiac:  normal S1, S2; RRR; no murmur  Lungs:  clear to auscultation bilaterally Abd: soft, nontender Ext: no edema Musculoskeletal:  No deformities Skin: warm and dry  Neuro: no focal abnormalities noted Psych:  Normal affect   EKG:  The EKG was personally reviewed and demonstrates:  sinus rhythm, rate 65, poor R wave progression, nonspecific intraventricular conduction delay, left axis deviation Telemetry:  Telemetry was personally reviewed and demonstrates:  Sinus bradycardia in 50s  Relevant CV Studies:   Laboratory Data:  High Sensitivity Troponin:   Recent Labs  Lab 05/01/21 1547  TROPONINIHS 56*     Chemistry Recent Labs  Lab 04/25/21 0704 05/01/21 1547  NA 139 139  K 4.6 4.0  CL 101 96*  CO2 28 28  GLUCOSE 136* 218*  BUN 17 19  CREATININE 4.57* 4.88*  CALCIUM 9.3 8.6*  GFRNONAA 9* 9*  ANIONGAP 10 15    No results for input(s): PROT, ALBUMIN, AST, ALT, ALKPHOS, BILITOT in the last 168 hours. Lipids No results for input(s): CHOL, TRIG, HDL, LABVLDL, LDLCALC, CHOLHDL in the last 168 hours.  Hematology Recent Labs  Lab 04/25/21 0704 05/01/21 1547  WBC 11.9* 12.3*  RBC 3.74* 3.94  HGB 11.7* 12.2  HCT 36.0 38.1  MCV 96.3 96.7  MCH 31.3 31.0  MCHC 32.5 32.0  RDW 15.9* 15.8*  PLT 225 269   Thyroid No results for input(s): TSH, FREET4 in the last 168 hours.  BNPNo results for input(s): BNP, PROBNP in the last 168 hours.  DDimer No results for input(s): DDIMER in the last 168 hours.   Radiology/Studies:  DG Chest Port 1 View  Result Date: 05/01/2021 CLINICAL DATA:  Chest pain, shortness of breath, nausea, end-stage renal disease, was at dialysis and was hypotensive with onset of symptoms EXAM: PORTABLE CHEST 1 VIEW COMPARISON:  Portable exam 1649 hours compared to 02/22/2021 FINDINGS: Enlargement of cardiac silhouette with pulmonary vascular congestion. Mediastinal contours  normal.  Subsegmental atelectasis at both lung bases. Question minimal perihilar edema. No gross pleural effusion or pneumothorax. IMPRESSION: Enlargement of cardiac silhouette with pulmonary vascular congestion and question minimal perihilar edema. Bibasilar atelectasis. Electronically Signed   By: Lavonia Dana M.D.   On: 05/01/2021 17:09     Assessment and Plan:   Chest pain: She reported chest pain in dialysis today.  EKG appears unchanged from baseline.  Troponin 56 > 52, not consistent with an acute coronary syndrome.  Suspect demand ischemia in setting of hypotension as SBP down to 70s during HD.  No significant CAD on cath 02/24/2021 -Check echocardiogram  Hypotension: SBP down to 70s during HD.  Likely due to fluid removal during HD, improved with IV fluids in ED.  Appears euvolemic on exam.  Continue midodrine  Chronic combined systolic and diastolic heart failure: Echo 02/19/2021 showed EF 25 to 30%.  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  She is on amiodarone.  Currently in sinus rhythm. -Continue amiodarone 200 mg daily -Continue Eliquis 5 mg twice daily  ESRD: On HD.   For questions or updates, please contact [place] Please consult [email or web address] for contact info under    Signed, Donato Heinz, MD  05/01/2021 6:57 PM

## 2021-05-01 NOTE — H&P (Signed)
[place]   PATIENT NAME: Stephanie Woodward    MR#:  [id number]  DATE OF BIRTH:  15-May-1940  DATE OF ADMISSION:  05/01/2021  PRIMARY CARE PHYSICIAN: Alroy Dust, L.Marlou Sa, MD   Patient is coming from: Home  REQUESTING/REFERRING PHYSICIAN: Blue, Soijett A, PA-C   CHIEF COMPLAINT:   Chief Complaint  Patient presents with   Chest Pain   Hypotension    HISTORY OF PRESENT ILLNESS:  Stephanie Woodward is a 81 y.o. African-American female with medical history significant for  ESRD on HD on MWF, persistent atrial fibrillation, chronic combined systolic and diastolic heart failure, type 2 diabetes mellitus, essential hypertension, dyslipidemia, obstructive sleep apnea, gout, COPD and asthma, presented to the ER with acute onset of hypotension in her hemodialysis session today followed by chest pain, 2 hours into her treatment.  Her chest pain was described as pressure in the midsternal area with associated nausea without vomiting or diaphoresis and spontaneously resolved within 5 minutes.  She denies any cough or wheezing or dyspnea, or hemoptysis, lower extremity edema or pain.  Her blood pressure was down to 78/36, for which she was given 250 mL normal saline bolus by EMS.  Her HD session was cut short secondarily.  BP improved to 104/40.  She denied any dyspnea or palpitations with his pain, nausea or vomiting or diaphoresis.  The patient has been on midodrine 10 mg p.o. 3 times daily recently for hypotension during hemodialysis.  No recent fever or chills.     A 2D echo on 02/19/2021 revealed an EF of 25 to 30%.  She was admitted for A-fib with RVR in 12/22 and was cardioverted in the ED and started on p.o. amiodarone and Toprol-XL.  She was subsequently found unresponsive and required intubation and transferred to the ICU.  Code stroke was called and brain MRI showed no acute process.  At that time 2D echo showed EF of 30 to 35%.  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  She went back to atrial fibrillation and was cardioverted on 03/03/2021 and 03/12/2021.  ED Course: When she came to the ER BP was 102/37 with an 83/45 and after 1 L bolus of IV normal saline became up to 118/33.  Heart rate was 55 with respiratory rate of 19.  Labs revealed a blood glucose of 218 and chloride of 96 with a creatinine of 4.88 and BUN 19.  High-sensitivity troponin I was 5652.  Lactic acid was 1.9 and later 1.7.  CBC showed mild leukocytosis of 12.3.  She had 2 blood cultures drawn. EKG as reviewed by me : EKG showed sinus rhythm with rate of 65 with nonspecific IVCD with LAD and Q waves anteroseptally with earlier presentation inferiorly. Imaging: Portable chest x-ray showed enlargement of her cardiac silhouette with pulmonary vascular congestion and questionable minimal perihilar edema.  The patient was given 1 L bolus of IV normal saline as mentioned above.  I ordered 1 p.o. midodrine.  She will be admitted to a cardiac telemetry observation bed for further evaluation and management. PAST MEDICAL HISTORY:   Past Medical History:  Diagnosis Date   Arthritis    Asthma    Atrial fibrillation (HCC)    CHF (congestive heart failure) (HCC)    CKD (chronic kidney disease), stage III (HCC)    COPD (chronic obstructive pulmonary disease) ([place])  Diabetes mellitus    INSULIN DEPENDENT   Gout    Heart murmur    no issues per pt   History of kidney stones    Hyperlipemia    Hypertension    Psoriasis    Renal disorder    congenital   Single kidney    Sleep apnea    doesn't use the Cpap    PAST SURGICAL HISTORY:   Past Surgical History:  Procedure Laterality Date   A/V FISTULAGRAM Left 10/31/2020   Procedure: A/V FISTULAGRAM;  Surgeon: Angelia Mould, MD;  Location: [place] [facility];  Service: Cardiovascular;  Laterality: Left;    ABDOMINAL HYSTERECTOMY     AV FISTULA PLACEMENT Left 11/17/2018   Procedure: BRACHIO-CEPHALIC ARTERIOVENOUS (AV) FISTULA CREATION IN LEFT ARM;  Surgeon: Marty Heck, MD;  Location: [place];  Service: Vascular;  Laterality: Left;   CARDIOVERSION N/A 03/03/2021   Procedure: CARDIOVERSION;  Surgeon: Larey Dresser, MD;  Location: [facility] ENDOSCOPY;  Service: Cardiovascular;  Laterality: N/A;   CARDIOVERSION N/A 03/12/2021   Procedure: CARDIOVERSION;  Surgeon: Larey Dresser, MD;  Location: [facility] ENDOSCOPY;  Service: Cardiovascular;  Laterality: N/A;   CHOLECYSTECTOMY     INSERTION OF DIALYSIS CATHETER Right 01/26/2019   Procedure: INSERTION OF DIALYSIS CATHETER, right internal jugular;  Surgeon: Angelia Mould, MD;  Location: [place];  Service: Vascular;  Laterality: Right;   LEFT AND RIGHT HEART CATHETERIZATION WITH CORONARY ANGIOGRAM N/A 11/29/2013   Procedure: LEFT AND RIGHT HEART CATHETERIZATION WITH CORONARY ANGIOGRAM;  Surgeon: Jacolyn Reedy, MD;  Location: [facility] CATH LAB;  Service: Cardiovascular;  Laterality: N/A;   LEFT HEART CATH AND CORONARY ANGIOGRAPHY N/A 02/24/2021   Procedure: LEFT HEART CATH AND CORONARY ANGIOGRAPHY;  Surgeon: Larey Dresser, MD;  Location: [place] [facility];  Service: Cardiovascular;  Laterality: N/A;   PERIPHERAL VASCULAR INTERVENTION Left 10/31/2020   Procedure: PERIPHERAL VASCULAR INTERVENTION;  Surgeon: Angelia Mould, MD;  Location: [place] [facility];  Service: Cardiovascular;  Laterality: Left;   RIGHT HEART CATH N/A 11/23/2017   Procedure: RIGHT HEART CATH;  Surgeon: Larey Dresser, MD;  Location: [place] [facility];  Service: Cardiovascular;  Laterality: N/A;   RIGHT/LEFT HEART CATH AND CORONARY ANGIOGRAPHY N/A 06/15/2019   Procedure: RIGHT/LEFT HEART CATH AND CORONARY ANGIOGRAPHY;  Surgeon: Larey Dresser, MD;  Location: [place] [facility];  Service: Cardiovascular;  Laterality: N/A;    SOCIAL HISTORY:   Social History   Tobacco Use    Smoking status: Former    Packs/day: 1.00    Years: 20.00    Pack years: 20.00    Types: Cigarettes    Quit date: 02/22/2009    Years since quitting: 12.1   Smokeless tobacco: Never  Substance Use Topics   Alcohol use: Yes    Alcohol/week: 0.0 standard drinks    Comment: occasional beer    FAMILY HISTORY:   Family History  Problem Relation Age of Onset   Lupus Daughter    Cancer Mother 54       type unknown   CVA Father 60   Cancer Brother 84       type unknown    DRUG ALLERGIES:   Allergies  Allergen Reactions   Eggs Or Egg-Derived Products Nausea And Vomiting   Lisinopril Nausea And Vomiting   Penicillins Nausea And Vomiting    Has patient had a PCN reaction causing immediate rash, facial/tongue/throat swelling, SOB or lightheadedness with hypotension: No Has patient  had a PCN reaction causing severe rash involving mucus membranes or skin necrosis: No Has patient had a PCN reaction that required hospitalization: No Has patient had a PCN reaction occurring within the last 10 years: No If all of the above answers are "NO", then may proceed with Cephalosporin use.    Other Other (See Comments)    REVIEW OF SYSTEMS:   ROS As per history of present illness. All pertinent systems were reviewed above. Constitutional, HEENT, cardiovascular, respiratory, GI, GU, musculoskeletal, neuro, psychiatric, endocrine, integumentary and hematologic systems were reviewed and are otherwise negative/unremarkable except for positive findings mentioned above in the HPI.   MEDICATIONS AT HOME:   Prior to Admission medications   Medication Sig Start Date End Date Taking? Authorizing Provider  acetaminophen (TYLENOL) 500 MG tablet Take 1,000 mg by mouth daily.    [provider]  albuterol (PROVENTIL HFA;VENTOLIN HFA) 108 (90 BASE) MCG/ACT inhaler Inhale 2 puffs into the lungs in the morning and at bedtime.    [provider]  amiodarone (PACERONE) 200 MG tablet Take 200  mg by mouth daily.    [provider]  Blood Glucose Monitoring Suppl (FREESTYLE LITE) DEVI Use as instructed to check blood sugar 4 times daily Patient taking differently: 1 each by Other route See admin instructions. Use as instructed to check blood sugar 4 times daily 12/29/20   Shamleffer, Melanie Crazier, MD  budesonide-formoterol [facility]) 160-4.5 MCG/ACT inhaler Inhale 2 puffs into the lungs 2 (two) times daily. 04/09/20   Collene Gobble, MD  Continuous Blood Gluc Receiver (FREESTYLE LIBRE READER) DEVI 1 kit by Does not apply route daily. Use as instructed E11.65 09/12/19   Shamleffer, Melanie Crazier, MD  diphenhydrAMINE (SOMINEX) 25 MG tablet Take 25 mg by mouth every 6 (six) hours as needed for itching.    [provider]  ELIQUIS 5 MG TABS tablet Take 1 tablet by mouth twice daily Patient taking differently: Take 5 mg by mouth 2 (two) times daily. 07/23/20   Larey Dresser, MD  glucose blood (FREESTYLE LITE) test strip USE AS DIRECTED 4 TIMES DAILY Patient taking differently: 1 each by Other route See admin instructions. USE AS DIRECTED 4 TIMES DAILY 02/02/21   Shamleffer, Melanie Crazier, MD  insulin aspart (NOVOLOG) 100 UNIT/ML injection CBG 70 - 120: 0 units CBG 121 - 150: 1 unit CBG 151 - 200: 2 units CBG 201 - 250: 3 units CBG 251 - 300: 5 units CBG 301 - 350: 7 units CBG 351 - 400: 9 units Patient taking differently: Inject 0-12 Units into the skin See admin instructions. Per sliding scale bid 70-200 = 0 units 201-250 = 2 units 251-300 = 4 units 301-350 = 6 units 351-400 = 8 units 401-450 = 10 units 451-600 = 12 units 03/05/21   Hosie Poisson, MD  insulin degludec (TRESIBA FLEXTOUCH) 100 UNIT/ML FlexTouch Pen Inject 18 Units into the skin daily. Patient taking differently: Inject 22 Units into the skin daily. 02/06/20   Shamleffer, Melanie Crazier, MD  Insulin Pen Needle 32G X 4 MM MISC 1 Device by Does not apply route in the morning, at noon, in the evening,  and at bedtime. 02/06/20   Shamleffer, Melanie Crazier, MD  ketoconazole (NIZORAL) 2 % cream Apply 1 application topically daily. 02/16/21   [provider]  latanoprost (XALATAN) 0.005 % ophthalmic solution Place 1 drop into both eyes at bedtime.    [provider]  lidocaine (LINDAMANTLE) 3 % CREA cream Apply 1  application topically every Monday, Wednesday, and Friday. Prior to dialysis 03/06/20   [provider]  Lidocaine 4 % PTCH Place 1 patch onto the skin daily. 11/12/20   [provider]  midodrine (PROAMATINE) 10 MG tablet Take 10 mg by mouth 3 (three) times daily. Hold if SBP>130    [provider]  Multiple Vitamin (MULTI-VITAMIN PO) Take 1 tablet by mouth daily.    [provider]  multivitamin (RENA-VIT) TABS tablet Take 1 tablet by mouth at bedtime. Patient not taking: Reported on 04/25/2021 03/05/21   Hosie Poisson, MD  Nutritional Supplements (FEEDING SUPPLEMENT, NEPRO CARB STEADY,) LIQD Take 237 mLs by mouth 3 (three) times daily with meals. 03/05/21 06/03/21  Hosie Poisson, MD  Nutritional Supplements (FEEDING SUPPLEMENT, NEPRO CARB STEADY,) LIQD Take 237 mLs by mouth 3 (three) times daily.    [provider]  pantoprazole (PROTONIX) 40 MG tablet Take 40 mg by mouth at bedtime. 10/26/18   [provider]  Pramoxine-Calamine (AVEENO ANTI-ITCH EX) Apply 1 application topically every 6 (six) hours as needed (itching).    [provider]  senna-docusate (SENOKOT-S) 8.6-50 MG tablet Take 1 tablet by mouth at bedtime as needed for mild constipation. Patient not taking: Reported on 04/25/2021 03/05/21   Hosie Poisson, MD  sucroferric oxyhydroxide (VELPHORO) 500 MG chewable tablet Chew 500 mg by mouth 3 (three) times daily with meals.    [provider]  traMADol (ULTRAM) 50 MG tablet Take 50 mg by mouth in the morning and at bedtime.    [provider]  traZODone (DESYREL) 50 MG tablet Take 25 mg by  mouth at bedtime.    [provider]      VITAL SIGNS:  Blood pressure (!) 111/45, pulse (!) 52, temperature 98.7 F (37.1 C), temperature source Oral, resp. rate 12, height '5\' 2"'  (1.575 m), weight 85.3 kg, SpO2 95 %.  PHYSICAL EXAMINATION:  Physical Exam  GENERAL:  81 y.o.-year-old patient lying in the bed with no acute distress.  EYES: Pupils equal, round, reactive to light and accommodation. No scleral icterus. Extraocular muscles intact.  HEENT: Head atraumatic, normocephalic. Oropharynx and nasopharynx clear.  NECK:  Supple, no jugular venous distention. No thyroid enlargement, no tenderness.  LUNGS: Normal breath sounds bilaterally, no wheezing, rales,rhonchi or crepitation. No use of accessory muscles of respiration.  CARDIOVASCULAR: Regular rate and rhythm, S1, S2 normal. No murmurs, rubs, or gallops.  ABDOMEN: Soft, nondistended, nontender. Bowel sounds present. No organomegaly or mass.  EXTREMITIES: No pedal edema, cyanosis, or clubbing.  NEUROLOGIC: Cranial nerves II through XII are intact. Muscle strength 5/5 in all extremities. Sensation intact. Gait not checked.  PSYCHIATRIC: The patient is alert and oriented x 3.  Normal affect and good eye contact. SKIN: No obvious rash, lesion, or ulcer.   LABORATORY PANEL:   CBC Recent Labs  Lab 05/01/21 1547  WBC 12.3*  HGB 12.2  HCT 38.1  PLT 269   ------------------------------------------------------------------------------------------------------------------  Chemistries  Recent Labs  Lab 05/01/21 1547  NA 139  K 4.0  CL 96*  CO2 28  GLUCOSE 218*  BUN 19  CREATININE 4.88*  CALCIUM 8.6*   ------------------------------------------------------------------------------------------------------------------  Cardiac Enzymes No results for input(s): TROPONINI in the last 168  hours. ------------------------------------------------------------------------------------------------------------------  RADIOLOGY:  DG Chest Port 1 View  Result Date: 05/01/2021 CLINICAL DATA:  Chest pain, shortness of breath, nausea, end-stage renal disease, was at dialysis and was hypotensive with onset of symptoms EXAM: PORTABLE CHEST 1 VIEW COMPARISON:  Portable  exam 1649 hours compared to 02/22/2021 FINDINGS: Enlargement of cardiac silhouette with pulmonary vascular congestion. Mediastinal contours normal. Subsegmental atelectasis at both lung bases. Question minimal perihilar edema. No gross pleural effusion or pneumothorax. IMPRESSION: Enlargement of cardiac silhouette with pulmonary vascular congestion and question minimal perihilar edema. Bibasilar atelectasis. Electronically Signed   By: Lavonia Dana M.D.   On: 05/01/2021 17:09      IMPRESSION AND PLAN:  Assessment and Plan: * Chest pain - The patient will be admitted to a cardiac telemetry observation bed. - We will follow serial troponins. - She will be placed on enteric-coated aspirin as well as as needed sublingual nitroglycerin and morphine sulfate for pain while monitoring her BP. - Cardiology consult was obtained by Dr. Gardiner Rhyme and is appreciated. - Elevated troponin was thought to be related to demand ischemia in the setting of hypotension especially given negative cardiac catheterization on 02/24/2021. - We will check a 2D echo to further evaluate her systolic function.  Hypotension - We will continue her ProAmatine. - We will give normal saline boluses as needed.  ESRD on hemodialysis [facility]) - Nephrology consult for follow-up can be obtained in a.m.  Type 2 diabetes mellitus, controlled, with renal complications ([place]) - The patient will be placed on supplemental coverage with NovoLog and will continue her basal coverage.  COPD (chronic obstructive pulmonary disease) (HCC) - We will continue her albuterol and hold  off her long-acting beta agonist while she is here.  Paroxysmal atrial fibrillation (HCC) - We will continue amiodarone and Eliquis.  GERD without esophagitis - We will continue PPI therapy.  Chronic combined systolic and diastolic heart failure ([place]) - Radiographic pulmonary vascular congestion does not really correspond with the clinical picture of the patient and she does not  seem to be fluid overloaded. - Nephrology follow-up consultation on hemodialysis can be obtained in a.m.       DVT prophylaxis: Eliquis. Advanced Care Planning:  Code Status: full code. Family Communication:  The plan of care was discussed in details with the patient (and family). I answered all questions. The patient agreed to proceed with the above mentioned plan. Further management will depend upon hospital course. Disposition Plan: Back to previous home environment Consults called: Cardiology.  Nephrology can be obtained in a.m. All the records are reviewed and case discussed with ED provider.  Status is: Observation.  I certify that at the time of admission, it is my clinical judgment that the patient will require  hospital care extending less than 2 midnights.                            Dispo: The patient is from: Home              Anticipated d/c is to: Home              Patient currently is not medically stable to d/c.              Difficult to place patient: No  Christel Mormon M.D on 05/01/2021 at 9:07 PM  [facility]   From 7 PM-7 AM, contact night-coverage [email or web address]  CC: Primary care physician; Alroy Dust, L.Marlou Sa, MD

## 2021-05-01 NOTE — Assessment & Plan Note (Deleted)
-   This is mainly radiographic however the patient clinically does not seem to be in fluid overload. ?- Nephrology follow-up consultation on hemodialysis can be obtained in a.m. ?

## 2021-05-02 ENCOUNTER — Observation Stay (HOSPITAL_BASED_OUTPATIENT_CLINIC_OR_DEPARTMENT_OTHER): Payer: HMO

## 2021-05-02 ENCOUNTER — Encounter (HOSPITAL_COMMUNITY): Payer: Self-pay | Admitting: Family Medicine

## 2021-05-02 DIAGNOSIS — N186 End stage renal disease: Secondary | ICD-10-CM | POA: Diagnosis not present

## 2021-05-02 DIAGNOSIS — K219 Gastro-esophageal reflux disease without esophagitis: Secondary | ICD-10-CM

## 2021-05-02 DIAGNOSIS — L89152 Pressure ulcer of sacral region, stage 2: Secondary | ICD-10-CM | POA: Diagnosis not present

## 2021-05-02 DIAGNOSIS — I5042 Chronic combined systolic (congestive) and diastolic (congestive) heart failure: Secondary | ICD-10-CM

## 2021-05-02 DIAGNOSIS — Z794 Long term (current) use of insulin: Secondary | ICD-10-CM | POA: Diagnosis not present

## 2021-05-02 DIAGNOSIS — I5021 Acute systolic (congestive) heart failure: Secondary | ICD-10-CM | POA: Diagnosis not present

## 2021-05-02 DIAGNOSIS — R079 Chest pain, unspecified: Secondary | ICD-10-CM | POA: Diagnosis not present

## 2021-05-02 DIAGNOSIS — Z992 Dependence on renal dialysis: Secondary | ICD-10-CM | POA: Diagnosis not present

## 2021-05-02 DIAGNOSIS — I480 Paroxysmal atrial fibrillation: Secondary | ICD-10-CM | POA: Diagnosis not present

## 2021-05-02 DIAGNOSIS — L8990 Pressure ulcer of unspecified site, unspecified stage: Secondary | ICD-10-CM | POA: Diagnosis present

## 2021-05-02 DIAGNOSIS — J42 Unspecified chronic bronchitis: Secondary | ICD-10-CM | POA: Diagnosis not present

## 2021-05-02 DIAGNOSIS — E1121 Type 2 diabetes mellitus with diabetic nephropathy: Secondary | ICD-10-CM | POA: Diagnosis not present

## 2021-05-02 DIAGNOSIS — I959 Hypotension, unspecified: Secondary | ICD-10-CM | POA: Diagnosis not present

## 2021-05-02 LAB — BLOOD CULTURE ID PANEL (REFLEXED) - BCID2

## 2021-05-02 LAB — GLUCOSE, CAPILLARY
Glucose-Capillary: 118 mg/dL — ABNORMAL HIGH (ref 70–99)
Glucose-Capillary: 135 mg/dL — ABNORMAL HIGH (ref 70–99)
Glucose-Capillary: 135 mg/dL — ABNORMAL HIGH (ref 70–99)
Glucose-Capillary: 241 mg/dL — ABNORMAL HIGH (ref 70–99)

## 2021-05-02 LAB — LIPID PANEL
Cholesterol: 109 mg/dL (ref 0–200)
HDL: 44 mg/dL (ref >40)
LDL Cholesterol: 52 mg/dL (ref 0–99)
Total CHOL/HDL Ratio: 2.5 RATIO
Triglycerides: 63 mg/dL (ref <150)
VLDL: 13 mg/dL (ref 0–40)

## 2021-05-02 LAB — TROPONIN I (HIGH SENSITIVITY): Troponin I (High Sensitivity): 49 ng/L — ABNORMAL HIGH (ref <18)

## 2021-05-02 LAB — ECHOCARDIOGRAM LIMITED
Area-P 1/2: 3.4 cm2
Calc EF: 46.6 %
Height: 63 in
S' Lateral: 4 cm
Single Plane A2C EF: 48 %
Single Plane A4C EF: 46.3 %
Weight: 2913.6 oz

## 2021-05-02 MED ORDER — PERFLUTREN LIPID MICROSPHERE
1.0000 mL | INTRAVENOUS | Status: AC | PRN
  Administered 2021-05-02: 2 mL via INTRAVENOUS
  Filled 2021-05-02: qty 10

## 2021-05-02 NOTE — Assessment & Plan Note (Signed)
Pressure Injury 05/01/21 Coccyx Right Stage 2 -  Partial thickness loss of dermis presenting as a shallow open injury with a red, pink wound bed without slough. 32D92 (Active)  ?05/01/21 2318  ?Location: Coccyx  ?Location Orientation: Right  ?Staging: Stage 2 -  Partial thickness loss of dermis presenting as a shallow open injury with a red, pink wound bed without slough.  ?Wound Description (Comments): 35X15  ?Present on Admission: Yes  ? ?Continue wound care. ? ? ?

## 2021-05-02 NOTE — Progress Notes (Signed)
? ?Progress Note ? ?Patient Name: Stephanie Woodward ?Date of Encounter: 05/02/2021 ? ?[facility] Cardiologist: Loralie Champagne, MD  ? ?Subjective  ? ?No CP  Breatghgn is OK   ? ?Inpatient Medications  ?  ?Scheduled Meds: ? [START ON 05/03/2021] amiodarone  200 mg Oral Daily  ? apixaban  5 mg Oral BID  ? insulin glargine-yfgn  22 Units Subcutaneous QHS  ? latanoprost  1 drop Both Eyes QHS  ? lidocaine  1 patch Transdermal Daily  ? midodrine  10 mg Oral TID  ? multivitamin with minerals   Oral Daily  ? pantoprazole  40 mg Oral QHS  ? sucroferric oxyhydroxide  500 mg Oral TID WC  ? traZODone  25 mg Oral QHS  ? ?Continuous Infusions: ? ?PRN Meds: ?acetaminophen, albuterol, diphenhydrAMINE, ondansetron (ZOFRAN) IV, traMADol  ? ?Vital Signs  ?  ?Vitals:  ? 05/01/21 2119 05/01/21 2215 05/01/21 2310 05/02/21 0417  ?BP: (!) 112/55 (!) 107/40 (!) 110/44 (!) 136/49  ?Pulse: (!) 55 (!) 54 (!) 57 (!) 59  ?Resp: '18 18 11 18  '$ ?Temp: 97.6 ?F (36.4 ?C)  98.8 ?F (37.1 ?C) 98.5 ?F (36.9 ?C)  ?TempSrc: Oral  Oral Oral  ?SpO2: 96% 93% 91% 94%  ?Weight:   82.6 kg   ?Height:   '5\' 3"'$  (1.6 m)   ? ? ?Intake/Output Summary (Last 24 hours) at 05/02/2021 0754 ?Last data filed at 05/01/2021 2113 ?Gross per 24 hour  ?Intake 1000 ml  ?Output --  ?Net 1000 ml  ? ?Last 3 Weights 05/01/2021 05/01/2021 04/25/2021  ?Weight (lbs) 182 lb 1.6 oz 188 lb 0.8 oz 188 lb  ?Weight (kg) 82.6 kg 85.3 kg 85.276 kg  ?   ? ?Telemetry  ?  ?SR  - Personally Reviewed ? ?ECG  ?  ? - Personally Reviewed ? ?Physical Exam  ? ?GEN: No acute distress.   ?Neck: No JVD ?Cardiac: RRR, no murmurs, rubs ?Respiratory: Clear to auscultation bilaterally. ?GI: Soft, nontender, non-distended  ?MS: No edema; No deformity. ?Neuro:  Nonfocal  ?Psych: Normal affect  ? ?Labs  ?  ?High Sensitivity Troponin:   ?Recent Labs  ?Lab 05/01/21 ?1547 05/01/21 ?1754 05/02/21 ?0201  ?TROPONINIHS 56* 52* 49*  ?   ?Chemistry ?Recent Labs  ?Lab 05/01/21 ?1547  ?NA 139  ?K 4.0  ?CL 96*  ?CO2 28  ?GLUCOSE 218*   ?BUN 19  ?CREATININE 4.88*  ?CALCIUM 8.6*  ?GFRNONAA 9*  ?ANIONGAP 15  ?  ?Lipids  ?Recent Labs  ?Lab 05/02/21 ?0201  ?CHOL 109  ?TRIG 63  ?HDL 44  ?[place] 52  ?CHOLHDL 2.5  ?  ?Hematology ?Recent Labs  ?Lab 05/01/21 ?1547  ?WBC 12.3*  ?RBC 3.94  ?HGB 12.2  ?HCT 38.1  ?MCV 96.7  ?MCH 31.0  ?MCHC 32.0  ?RDW 15.8*  ?PLT 269  ? ?Thyroid No results for input(s): TSH, FREET4 in the last 168 hours.  ?BNPNo results for input(s): BNP, PROBNP in the last 168 hours.  ?DDimer No results for input(s): DDIMER in the last 168 hours.  ? ?Radiology  ?  ?DG Chest Port 1 View ? ?Result Date: 05/01/2021 ?CLINICAL DATA:  Chest pain, shortness of breath, nausea, end-stage renal disease, was at dialysis and was hypotensive with onset of symptoms EXAM: PORTABLE CHEST 1 VIEW COMPARISON:  Portable exam 1649 hours compared to 02/22/2021 FINDINGS: Enlargement of cardiac silhouette with pulmonary vascular congestion. Mediastinal contours normal. Subsegmental atelectasis at both lung bases. Question minimal perihilar edema. No gross pleural effusion  or pneumothorax. IMPRESSION: Enlargement of cardiac silhouette with pulmonary vascular congestion and question minimal perihilar edema. Bibasilar atelectasis. Electronically Signed   By: Lavonia Dana M.D.   On: 05/01/2021 17:09   ? ?Cardiac Studies  ? ?Echo being done   ? ?Patient Profile  ?Stephanie Woodward is a 81 y.o. female with a hx of ESRD, persistent atrial fibrillation, chronic combined systolic and diastolic heart failure who is being seen 05/01/2021 for the evaluation of chest pain/elevated troponin at the request of Dr. Sidney Ace ? ?Assessment & Plan  ?  ?1  CP   / elevated troponin    Occurred at dialysis   Sharpt   BP down    Received fluids     ?Trop elevation was minor and flat  (56, 52,49)   May reprewsent demand in settng of hypotension    ?Echo pending   Pt asympotmatic    ? ?2  Hx of systolic /diastolic CHF    Echo pending   Pt is comfortable at presetn   ? ?3  Atrail fibrillation      Remaining in SR on amiodarone     Keep on Eliuqis   ? ?4  ESRD  on HD  Per pt's daughter she has had a difficult time this week   Went 3x because BP low   Could not draw off fluid    ?Note her appetite has been down some for a couple weeks   PO intake down  May be due to tramadol which has been discontinumed    ? If taking off too much fluid   She may have lost muscle mass  ? ? ? ?For questions or updates, please contact [place] ?Please consult [email or web address] for contact info under  ? ?  ?   ?Signed, ?Dorris Carnes, MD  ?05/02/2021, 7:54 AM   ? ?

## 2021-05-02 NOTE — Social Work (Signed)
CSW was alerted that pt was medically ready for DC. Pt is from [facility] and [place] spoke with Elnita Maxwell and she confirmed that pt could return today. Pt was at her 24 hour mark therefore she could return with a printed DC. Kirsten explained that pt is there under LTC. ? ?

## 2021-05-02 NOTE — Care Management Obs Status (Signed)
MEDICARE OBSERVATION STATUS NOTIFICATION ? ? ?Patient Details  ?Name: Stephanie Woodward ?MRN: [id number] ?Date of Birth: [birth year] ? ? ?Medicare Observation Status Notification Given:  Yes ? ? ? ?Alphonse Asbridge G., RN ?05/02/2021, 11:19 AM ?

## 2021-05-02 NOTE — Progress Notes (Signed)
Report called in to [place] at [facility] from the [employer]. RN with prior knowledge of Pt and updated to new plan of care. All questions and concerns addressed. ?

## 2021-05-02 NOTE — Hospital Course (Addendum)
Stephanie Woodward is a 80 y.o. African-American female with past medical history of end-stage renal disease on hemodialysis, persistent atrial fibrillation,  chronic combined systolic and diastolic heart failure, type 2 diabetes mellitus, essential hypertension, dyslipidemia, obstructive sleep apnea, gout, COPD and asthma, presented to the hospital with chest pain and low blood pressure during hemodialysis.  Chest pain was described as pressure in the midsternal area with nausea.  Blood pressure was noted to be down around 78/ 36 and was given normal saline bolus and was brought into the hospital.  Of note patient did have a recent echocardiogram on 02/19/2021 revealed an EF of 25 to 30%.  She was admitted for A-fib with RVR in 12/22 and was cardioverted in the ED and started on p.o. amiodarone and Toprol-XL.  She was subsequently found unresponsive and required intubation and transferred to the ICU.  Code stroke was called and brain MRI showed no acute process.  At that time 2D echo showed EF of 30 to 35%.  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  In the ED on this admission, blood pressure improved after bolus.  High-sensitivity troponin was elevated at 5652.  She had mild leukocytosis.  EKG showed normal sinus rhythm.  Chest x-ray showed cardiomegaly with vascular congestion.  Patient was then considered for admission to the hospital for further evaluation and treatment. ? ?

## 2021-05-02 NOTE — TOC Transition Note (Signed)
Transition of Care (TOC) - CM/SW Discharge Note ? ? ?Patient Details  ?Name: Stephanie Woodward ?MRN: [id number] ?Date of Birth: 12-07-1940 ? ?Transition of Care (TOC) CM/SW Contact:  ?Bary Castilla, LCSW ?Phone Number: [phone] ?05/02/2021, 4:00 PM ? ? ?Clinical Narrative:    ? ?Patient will DC to:?[facility] ?Anticipated DC date:?05/02/2021 ?Family notified:?[name] ?Transport by: Corey Harold ?  ?Per MD patient ready for DC to [facility]. RN, patient, patient's family, and facility notified of DC. Discharge Summary printed per facility request. RN given number for report  [phone] ask for the RN on [facility]. DC packet on chart. Ambulance transport requested for patient.  ?CSW signing off. ?  ?Vallery Ridge, LCSW ?[phone]  ? ?  ?  ? ? ?Patient Goals and CMS Choice ?  ?  ?  ? ?Discharge Placement ?  ?           ?  ?  ?  ?  ? ?Discharge Plan and Services ?  ?  ?           ?  ?  ?  ?  ?  ?  ?  ?  ?  ?  ? ?Social Determinants of Health (SDOH) Interventions ?  ? ? ?Readmission Risk Interventions ?No flowsheet data found. ? ? ? ? ?

## 2021-05-02 NOTE — Progress Notes (Signed)
PHARMACY - PHYSICIAN COMMUNICATION ?CRITICAL VALUE ALERT - BLOOD CULTURE IDENTIFICATION (BCID) ? ?Stephanie Woodward is an 81 y.o. female who presented to [facility] on 05/01/2021 with a chief complaint of CP. ? ?Name of physician (or Provider) Contacted: [place] ? ?Current antibiotics: none ? ?Changes to prescribed antibiotics recommended:  ?Recommendations accepted by provider - likely contaminant ? ?Results for orders placed or performed during the hospital encounter of 05/01/21  ?Blood Culture ID Panel (Reflexed) (Collected: 05/01/2021  4:28 PM)  ?Result Value Ref Range  ? Enterococcus faecalis NOT DETECTED NOT DETECTED  ? Enterococcus Faecium NOT DETECTED NOT DETECTED  ? Listeria monocytogenes NOT DETECTED NOT DETECTED  ? Staphylococcus species DETECTED (A) NOT DETECTED  ? Staphylococcus aureus (BCID) NOT DETECTED NOT DETECTED  ? Staphylococcus epidermidis DETECTED (A) NOT DETECTED  ? Staphylococcus lugdunensis NOT DETECTED NOT DETECTED  ? Streptococcus species NOT DETECTED NOT DETECTED  ? Streptococcus agalactiae NOT DETECTED NOT DETECTED  ? Streptococcus pneumoniae NOT DETECTED NOT DETECTED  ? Streptococcus pyogenes NOT DETECTED NOT DETECTED  ? A.calcoaceticus-baumannii NOT DETECTED NOT DETECTED  ? Bacteroides fragilis NOT DETECTED NOT DETECTED  ? Enterobacterales NOT DETECTED NOT DETECTED  ? Enterobacter cloacae complex NOT DETECTED NOT DETECTED  ? Escherichia coli NOT DETECTED NOT DETECTED  ? Klebsiella aerogenes NOT DETECTED NOT DETECTED  ? Klebsiella oxytoca NOT DETECTED NOT DETECTED  ? Klebsiella pneumoniae NOT DETECTED NOT DETECTED  ? Proteus species NOT DETECTED NOT DETECTED  ? Salmonella species NOT DETECTED NOT DETECTED  ? Serratia marcescens NOT DETECTED NOT DETECTED  ? Haemophilus influenzae NOT DETECTED NOT DETECTED  ? Neisseria meningitidis NOT DETECTED NOT DETECTED  ? Pseudomonas aeruginosa NOT DETECTED NOT DETECTED  ? Stenotrophomonas maltophilia NOT DETECTED NOT DETECTED  ? Candida albicans  NOT DETECTED NOT DETECTED  ? Candida auris NOT DETECTED NOT DETECTED  ? Candida glabrata NOT DETECTED NOT DETECTED  ? Candida krusei NOT DETECTED NOT DETECTED  ? Candida parapsilosis NOT DETECTED NOT DETECTED  ? Candida tropicalis NOT DETECTED NOT DETECTED  ? Cryptococcus neoformans/gattii NOT DETECTED NOT DETECTED  ? Methicillin resistance mecA/C DETECTED (A) NOT DETECTED  ? ? ?Einar Grad ?05/02/2021  5:45 PM ? ?

## 2021-05-02 NOTE — Discharge Summary (Signed)
Physician Discharge Summary   Patient: Stephanie Woodward MRN: [id number] DOB: 04-26-1940  Admit date:     05/01/2021  Discharge date: 05/02/21  Discharge Physician: Flora Lipps   PCP: Alroy Dust, L.Marlou Sa, MD   Recommendations at discharge:   Follow-up with your primary care physician in 1 week at the skilled nursing facility.   Continue hemodialysis as per schedule.  Discharge Diagnoses: Active Problems:   Hypotension   ESRD on hemodialysis (HCC)   Type 2 diabetes mellitus, controlled, with renal complications (HCC)   Chronic combined systolic and diastolic heart failure (HCC)   COPD (chronic obstructive pulmonary disease) (HCC)   Paroxysmal atrial fibrillation (HCC)   GERD without esophagitis   Pressure injury of skin  Principal Problem (Resolved):   Chest pain  Hospital Course: Stephanie Woodward is a 81 y.o. African-American female with past medical history of end-stage renal disease on hemodialysis, persistent atrial fibrillation,  chronic combined systolic and diastolic heart failure, type 2 diabetes mellitus, essential hypertension, dyslipidemia, obstructive sleep apnea, gout, COPD and asthma, presented to the hospital with chest pain and low blood pressure during hemodialysis.  Chest pain was described as pressure in the midsternal area with nausea.  Blood pressure was noted to be down around 78/ 36 and was given normal saline bolus and was brought into the hospital.  Of note patient did have a recent echocardiogram on 02/19/2021 revealed an EF of 25 to 30%.  She was admitted for A-fib with RVR in 12/22 and was cardioverted in the ED and started on p.o. amiodarone and Toprol-XL.  She was subsequently found unresponsive and required intubation and transferred to the ICU.  Code stroke was called and brain MRI showed no acute process.  At that time 2D echo showed EF of 30 to 35%.  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  In the ED on this admission, blood pressure improved after bolus.  High-sensitivity troponin was elevated at 5652.  She had mild leukocytosis.  EKG showed normal sinus rhythm.  Chest x-ray showed cardiomegaly with vascular congestion.  Patient was then considered for admission to the hospital for further evaluation and treatment.   Assessment and Plan: * Chest pain-resolved as of 05/02/2021 Slightly elevated troponin but the EKG without acute changes.  Continue aspirin, sublingual nitroglycerin.   Cardiology saw the the patient during hospitalization.  At this time no chest pain..  Elevated troponin thought to be secondary to demand ischemia.  2D echocardiogram from 05/02/2021 with improved LV function compared to 2D echocardiogram from 2022.  Hypotension Continue midodrine.  Blood pressure has improved at this time.  ESRD on hemodialysis [facility])  Patient had undergone dialysis on Friday.  Resume hemodialysis after discharge.  Chronic combined systolic and diastolic heart failure (HCC) Currently volume compensated.  Patient goes for dialysis as outpatient.  Type 2 diabetes mellitus, controlled, with renal complications (HCC) Received sliding scale insulin, Accu-Cheks during hospitalization.  Resume home insulin regimen on discharge.  COPD (chronic obstructive pulmonary disease) (HCC) Continue albuterol, Symbicort from home.  Paroxysmal atrial fibrillation (HCC) Continue amiodarone and Eliquis.  Remained rate controlled.  Pressure injury of skin Pressure Injury 05/01/21 Coccyx Right Stage 2 -  Partial thickness loss of dermis presenting as a shallow open injury with a red, pink wound bed without slough. [id number] (Active)  05/01/21  2318  Location: Coccyx  Location Orientation: Right  Staging: Stage 2 -  Partial thickness loss of  dermis presenting as a shallow open injury with a red, pink wound bed without slough.  Wound Description (Comments): 35X15  Present on Admission: Yes   Continue wound care.    GERD without esophagitis Continue Protonix.   Consultants: Cardiology  Procedures performed: 2D echocardiogram  Disposition: Skilled nursing facility Diet recommendation:  Discharge Diet Orders (From admission, onward)     Start     Ordered   05/02/21 0000  Diet - low sodium heart healthy        05/02/21 1521   05/02/21 0000  Diet Carb Modified        05/02/21 1521           Cardiac and Carb modified diet DISCHARGE MEDICATION: Allergies as of 05/02/2021       Reactions   Eggs Or Egg-derived Products Nausea And Vomiting   Lisinopril Nausea And Vomiting   Penicillins Nausea And Vomiting   Has patient had a PCN reaction causing immediate rash, facial/tongue/throat swelling, SOB or lightheadedness with hypotension: No Has patient had a PCN reaction causing severe rash involving mucus membranes or skin necrosis: No Has patient had a PCN reaction that required hospitalization: No Has patient had a PCN reaction occurring within the last 10 years: No If all of the above answers are "NO", then may proceed with Cephalosporin use.   Other Other (See Comments)        Medication List     TAKE these medications    acetaminophen 500 MG tablet Commonly known as: TYLENOL Take 1,000 mg by mouth daily.   albuterol 108 (90 Base) MCG/ACT inhaler Commonly known as: VENTOLIN HFA Inhale 2 puffs into the lungs in the morning and at bedtime.   amiodarone 200 MG tablet Commonly known as: PACERONE Take 200 mg by mouth daily.   atorvastatin 80 MG tablet Commonly known as: LIPITOR Take 80 mg by mouth at bedtime.   AVEENO ANTI-ITCH EX Apply 1 application topically every 6 (six) hours as needed (itching).   Biofreeze 4 % Gel Generic drug: Menthol (Topical Analgesic) Apply 1 application. topically in  the morning, at noon, and at bedtime.   budesonide-formoterol 160-4.5 MCG/ACT inhaler Commonly known as: SYMBICORT Inhale 2 puffs into the lungs 2 (two) times daily.   diphenhydrAMINE 25 mg capsule Commonly known as: BENADRYL Take 25 mg by mouth every 6 (six) hours as needed for itching.   Eliquis 5 MG Tabs tablet Generic drug: apixaban Take 1 tablet by mouth twice daily What changed: how much to take   feeding supplement (NEPRO CARB STEADY) Liqd Take 237 mLs by mouth 3 (three) times daily with meals.   FreeStyle [employer] 1 kit by Does not apply route daily. Use as instructed E11.65   FreeStyle Lite Devi Use as instructed to check blood sugar 4 times daily What changed:  how much to take how to take this when to take this   FREESTYLE LITE test strip Generic drug: glucose blood USE AS DIRECTED 4 TIMES DAILY What changed:  how much to take how to take this when to take this   insulin aspart 100 UNIT/ML injection Commonly known as: novoLOG CBG 70 - 120: 0 units CBG 121 - 150: 1 unit CBG 151 - 200: 2 units CBG 201 - 250: 3 units CBG 251 - 300: 5 units CBG 301 - 350: 7 units CBG 351 - 400:  9 units What changed:  how much to take how to take this when to take this additional instructions   Insulin Pen Needle 32G X 4 MM Misc 1 Device by Does not apply route in the morning, at noon, in the evening, and at bedtime.   ketoconazole 2 % cream Commonly known as: NIZORAL Apply 1 application topically daily.   latanoprost 0.005 % ophthalmic solution Commonly known as: XALATAN Place 1 drop into both eyes at bedtime.   lidocaine 3 % Crea cream Commonly known as: LINDAMANTLE Apply 1 application topically every Monday, Wednesday, and Friday. Prior to dialysis   Lidocaine 4 % Ptch Place 1 patch onto the skin daily.   midodrine 10 MG tablet Commonly known as: PROAMATINE Take 10 mg by mouth 3 (three) times daily. Hold if SBP>130   MULTI-VITAMIN PO Take 1 tablet  by mouth daily.   multivitamin Tabs tablet Take 1 tablet by mouth at bedtime.   pantoprazole 40 MG tablet Commonly known as: PROTONIX Take 40 mg by mouth at bedtime.   senna-docusate 8.6-50 MG tablet Commonly known as: Senokot-S Take 1 tablet by mouth at bedtime as needed for mild constipation.   sucroferric oxyhydroxide 500 MG chewable tablet Commonly known as: VELPHORO Chew 500 mg by mouth 3 (three) times daily with meals.   traZODone 50 MG tablet Commonly known as: DESYREL Take 25 mg by mouth at bedtime.   Tyler Aas FlexTouch 100 UNIT/ML FlexTouch Pen Generic drug: insulin degludec Inject 18 Units into the skin daily. What changed: how much to take               Discharge Care Instructions  (From admission, onward)           Start     Ordered   05/02/21 0000  Discharge wound care:       Comments: Sacral coccygeal ulcer care   05/02/21 1521           Subjective Today, patient was seen and examined at bedside.  Denies any chest pain at the time of my evaluation.  Denies shortness of breath dizziness lightheadedness.  Patient's family at bedside  Discharge Exam: Filed Weights   05/01/21 1544 05/01/21 2310  Weight: 85.3 kg 82.6 kg   Vitals with BMI 05/02/2021 05/02/2021 05/02/2021  Height - - -  Weight - - -  BMI - - -  Systolic 935 701 779  Diastolic 71 40 49  Pulse 48 59 59   Body mass index is 32.26 kg/m.   General: Obese built, not in obvious distress HENT:   No scleral pallor or icterus noted. Oral mucosa is moist.  Chest:   Diminished breath sounds bilaterally. No crackles or wheezes.  CVS: S1 &S2 heard. No murmur.  Regular rate and rhythm. Abdomen: Soft, nontender, nondistended.  Bowel sounds are heard.   Extremities: No cyanosis, clubbing or edema.  Peripheral pulses are palpable. Psych: Alert, awake and oriented, normal mood CNS:  No cranial nerve deficits.  Power equal in all extremities.   Skin: Warm and dry.  No rashes  noted.   Condition at discharge: good  The results of significant diagnostics from this hospitalization (including imaging, microbiology, ancillary and laboratory) are listed below for reference.   Imaging Studies: CT Abdomen Pelvis Wo Contrast  Result Date: 04/25/2021 CLINICAL DATA:  Flank pain.  Evaluate for kidney stone. EXAM: CT ABDOMEN AND PELVIS WITHOUT CONTRAST TECHNIQUE: Multidetector CT imaging of the abdomen and pelvis was performed following the standard protocol  without IV contrast. RADIATION DOSE REDUCTION: This exam was performed according to the departmental dose-optimization program which includes automated exposure control, adjustment of the mA and/or kV according to patient size and/or use of iterative reconstruction technique. COMPARISON:  None. FINDINGS: Lower chest: No acute abnormality. Hepatobiliary: No focal liver abnormality is seen. Status post cholecystectomy. No biliary dilatation. Pancreas: Unremarkable. No pancreatic ductal dilatation or surrounding inflammatory changes. Spleen: Normal in size without focal abnormality. Adrenals/Urinary Tract: Normal right adrenal gland. There is a nodule within the left adrenal gland which measures 2.7 x 2.0 cm with radiodensity measurements of 12.3 Hounsfield units, image 24/3. 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 No kidney stones identified. No hydronephrosis, hydroureter or ureteral lithiasis. Urinary bladder appears partially decompressed but is otherwise unremarkable. Stomach/Bowel: Stomach appears normal. The appendix is not visualized. Surgical clips identified at the cecal base which may reflect sequelae of prior appendectomy. No bowel wall thickening, inflammation, or distension. Vascular/Lymphatic: Aortic atherosclerosis without aneurysm. No abdominopelvic adenopathy. Reproductive: The uterus appears surgically absent. There are bilateral cystic adnexal masses. On the right the cystic mass  measures 7.7 x 5.1 cm. On the left cystic mass measures 7.2 by 6.5 cm, image 57/3. Other: No free fluid or fluid collections identified Musculoskeletal: Lumbar degenerative disc disease identified. Bones appear osteopenic. No acute or suspicious osseous findings. IMPRESSION: 1. No acute findings within the abdomen or pelvis. 2. 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 3. Bilateral cystic adnexal masses. Most severe: 7.7 cm, not adequately characterized. Recommend prompt follow-up with pelvic US. 4. Indeterminate left adrenal nodule measuring 2.7 cm. Recommend nonemergent adrenal washout CT or chemical shift MRI. JACR 2017 Aug; 14(8):1038-44, JCAT 2016 Mar-Apr; 40(2):194-200, Urol J 2006 Spring; 3(2):71-4. 5. Aortic Atherosclerosis (ICD10-I70.0). Electronically Signed   By: Kerby Moors M.D.   On: 04/25/2021 10:20   DG Lumbar Spine Complete  Result Date: 04/25/2021 CLINICAL DATA:  81 year old female status post fall with pain. EXAM: LUMBAR SPINE - COMPLETE 4+ VIEW COMPARISON:  Lumbar radiographs 06/23/2009. FINDINGS: Normal lumbar segmentation. Lumbar lordosis has not significantly changed since 2011. Stable vertebral body height. Diffuse disc space loss since 2011, moderate or severe L3-L4 through L5-S1. New vacuum disc at L2-L3. Probable interbody ankylosis via bulky endplate osteophytes at L1-L2, L3-L4 and L4-L5. Questionable bilateral SI joint ankylosis which is new. No acute osseous abnormality identified. Calcified aortic atherosclerosis. Chronic right upper quadrant suture lines and surgical clips. Generator device projects over the left abdomen. Chronic right lower quadrant surgical clips. Negative visible bowel gas. IMPRESSION: 1. No acute osseous abnormality identified in the lumbar spine. 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 2. Chronic disc degeneration.  Aortic Atherosclerosis (ICD10-I70.0). Electronically Signed   By: Genevie Ann M.D.   On:  04/25/2021 08:10   DG Wrist Complete Left  Result Date: 04/20/2021 CLINICAL DATA:  Fall.  Left wrist pain. EXAM: LEFT WRIST - COMPLETE 3+ VIEW COMPARISON:  None. FINDINGS: Faint radiopaque densities along the dorsal aspect of the lunate on the lateral view likely chronic. Acute cortical avulsion injury is less likely. Clinical correlation is recommended. No other acute fracture. The bones are osteopenic. There is degenerative changes of the wrist and base of the thumb. The soft tissues are grossly unremarkable. IMPRESSION: Chronic changes versus less likely acute cortical avulsion fracture from the dorsal aspect of the lunate. Electronically Signed   By: Anner Crete M.D.   On: 04/20/2021 22:58   CT  Head Wo Contrast  Result Date: 04/25/2021 CLINICAL DATA:  Altered mental status EXAM: CT HEAD WITHOUT CONTRAST TECHNIQUE: Contiguous axial images were obtained from the base of the skull through the vertex without intravenous contrast. RADIATION DOSE REDUCTION: This exam was performed according to the departmental dose-optimization program which includes automated exposure control, adjustment of the mA and/or kV according to patient size and/or use of iterative reconstruction technique. COMPARISON:  04/20/2021 FINDINGS: Brain: No acute intracranial findings are seen. Cortical sulci are prominent. There is decreased density in the periventricular white matter. Vascular: Unremarkable. Skull: No fracture is seen in the calvarium. Sinuses/Orbits: Unremarkable. Other: No significant interval changes are noted. IMPRESSION: No acute intracranial findings are seen in noncontrast CT brain. Atrophy. Small-vessel disease. Electronically Signed   By: Elmer Picker M.D.   On: 04/25/2021 13:18   CT HEAD WO CONTRAST (5MM)  Result Date: 04/20/2021 CLINICAL DATA:  [place], anticoagulated EXAM: CT HEAD WITHOUT CONTRAST TECHNIQUE: Contiguous axial images were obtained from the base of the skull through the vertex without  intravenous contrast. RADIATION DOSE REDUCTION: This exam was performed according to the departmental dose-optimization program which includes automated exposure control, adjustment of the mA and/or kV according to patient size and/or use of iterative reconstruction technique. COMPARISON:  02/19/2021 FINDINGS: Brain: Stable hypodensities throughout the periventricular and subcortical white matter consistent with chronic small vessel ischemic change. No evidence of acute infarct or hemorrhage. Lateral ventricles and remaining midline structures are unremarkable. No acute extra-axial fluid collections. No mass effect. Vascular: No hyperdense vessel or unexpected calcification. Skull: Small left frontal scalp hematoma. No underlying fracture. The remainder of the calvarium is unremarkable. Sinuses/Orbits: No acute finding. Other: None. IMPRESSION: 1. Small left frontal scalp hematoma. 2. No acute intracranial process. 3. Stable chronic small-vessel ischemic changes throughout the white matter. Electronically Signed   By: Randa Ngo M.D.   On: 04/20/2021 22:38   CT Cervical Spine Wo Contrast  Result Date: 04/20/2021 CLINICAL DATA:  Fall. EXAM: CT CERVICAL SPINE WITHOUT CONTRAST TECHNIQUE: Multidetector CT imaging of the cervical spine was performed without intravenous contrast. Multiplanar CT image reconstructions were also generated. RADIATION DOSE REDUCTION: This exam was performed according to the departmental dose-optimization program which includes automated exposure control, adjustment of the mA and/or kV according to patient size and/or use of iterative reconstruction technique. COMPARISON:  None. FINDINGS: Alignment: No acute subluxation. Skull base and vertebrae: No acute fracture. Soft tissues and spinal canal: No prevertebral fluid or swelling. No visible canal hematoma. Disc levels:  Multilevel degenerative changes. Upper chest: Negative. Other: None IMPRESSION: 1. No acute fracture or subluxation of  the cervical spine. 2. Multilevel degenerative changes. Electronically Signed   By: Anner Crete M.D.   On: 04/20/2021 22:42   US PELVIS LIMITED (TRANSABDOMINAL ONLY)  Result Date: 04/25/2021 CLINICAL DATA:  Large cyst on CT, rule out torsion. EXAM: TRANSABDOMINAL AND TRANSVAGINAL ULTRASOUND OF PELVIS TECHNIQUE: Only transabdominal ultrasound examinations of the pelvis was performed. Transvaginal examination could not be performed because patient was unable to position herself for trans vaginal approach. Transabdominal technique was performed for global imaging of the pelvis including uterus, ovaries, adnexal regions, and pelvic cul-de-sac. COMPARISON:  CT examination performed earlier on the same date FINDINGS: Uterus Status post hysterectomy. Right ovary Not visualized on transabdominal approach. Left ovary Not visualized on transabdominal approach. Other findings No abnormal free fluid. IMPRESSION: Nondiagnostic examination.  Bilateral ovaries were not visualized. Electronically Signed   By: Keane Police D.O.   On: 04/25/2021 11:54  DG Chest Port 1 View  Result Date: 05/01/2021 CLINICAL DATA:  Chest pain, shortness of breath, nausea, end-stage renal disease, was at dialysis and was hypotensive with onset of symptoms EXAM: PORTABLE CHEST 1 VIEW COMPARISON:  Portable exam 1649 hours compared to 02/22/2021 FINDINGS: Enlargement of cardiac silhouette with pulmonary vascular congestion. Mediastinal contours normal. Subsegmental atelectasis at both lung bases. Question minimal perihilar edema. No gross pleural effusion or pneumothorax. IMPRESSION: Enlargement of cardiac silhouette with pulmonary vascular congestion and question minimal perihilar edema. Bibasilar atelectasis. Electronically Signed   By: Lavonia Dana M.D.   On: 05/01/2021 17:09   DG Knee Complete 4 Views Left  Result Date: 04/20/2021 CLINICAL DATA:  Fall, pain EXAM: LEFT KNEE - COMPLETE 4+ VIEW COMPARISON:  None. FINDINGS: Moderate  tricompartment degenerative changes. No joint effusion. No acute bony abnormality. Specifically, no fracture, subluxation, or dislocation. IMPRESSION: Moderate degenerative changes.  No acute bony abnormality. Electronically Signed   By: Rolm Baptise M.D.   On: 04/20/2021 22:57   DG Knee Complete 4 Views Right  Result Date: 04/20/2021 CLINICAL DATA:  Fall, pain EXAM: RIGHT KNEE - COMPLETE 4+ VIEW COMPARISON:  None. FINDINGS: Advanced tricompartment degenerative changes within the right knee. No joint effusion. No acute bony abnormality. Specifically, no fracture, subluxation, or dislocation. IMPRESSION: Advanced tricompartment degenerative changes. No acute bony abnormality. Electronically Signed   By: Rolm Baptise M.D.   On: 04/20/2021 22:56   DG HIP UNILAT W OR W/O PELVIS 2-3 VIEWS LEFT  Result Date: 04/25/2021 CLINICAL DATA:  81 year old female status post fall with pain. EXAM: DG HIP (WITH OR WITHOUT PELVIS) 2-3V LEFT COMPARISON:  Abdominal radiographs 12/26/2009. FINDINGS: Generator devices project over the medial right thigh and left abdomen. Femoral heads are normally located, with fairly symmetric bilateral hip joint space loss since 2011. Aortoiliac calcified atherosclerosis. Osteopenia. Pelvis appears intact. Grossly intact proximal right femur. Proximal left femur appears intact with prominent acetabular spurring. IMPRESSION: 1. No acute fracture or dislocation identified about the left hip or pelvis. 2. 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 Electronically Signed   By: Genevie Ann M.D.   On: 04/25/2021 08:07   ECHOCARDIOGRAM LIMITED  Result Date: 05/02/2021    ECHOCARDIOGRAM LIMITED REPORT   Patient Name:   MAAT KAFER Date of Exam: 05/02/2021 Medical Rec #:  [id number]      Height:       63.0 in Accession #:    [id number]     Weight:       182.1 lb Date of Birth:  [birth year]     BSA:          1.858 m Patient Age:    77 years       BP:           134/49 mmHg Patient Gender: F               HR:           52 bpm. Exam Location:  Inpatient Procedure: Limited Echo, Cardiac Doppler, Color Doppler and Intracardiac            Opacification Agent Indications:    CHF-Acute Systolic O84.16  History:        Patient has prior history of Echocardiogram examinations, most                 recent 02/20/2021. Signs/Symptoms:Murmur; Risk                 Factors:Hypertension, Diabetes, Dyslipidemia, Sleep Apnea and  Former Smoker. End stage renal disease. Persistent atrial                 fibrillation. chronic combined systolic and diastolic heart                 failure. Elevated troponin.  Sonographer:    Darlina Sicilian RDCS Referring Phys: [id number] [place]  1. Left ventricular ejection fraction, by estimation, is 45 to 50%. The left ventricle has mildly decreased function. The left ventricle demonstrates global hypokinesis. There is mild concentric left ventricular hypertrophy. Elevated left atrial pressure.  2. Right ventricular systolic function is normal. The right ventricular size is normal. There is severely elevated pulmonary artery systolic pressure. The estimated right ventricular systolic pressure is 62.9 mmHg.  3. 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 Mild mitral valve regurgitation. Moderate mitral annular calcification.  4. The aortic valve was not well visualized. Aortic valve regurgitation is not visualized. Aortic valve sclerosis is present, with no evidence of aortic valve stenosis.  5. The inferior vena cava is dilated in size with >50% respiratory variability, suggesting right atrial pressure of 8 mmHg.  6. Left atrial size was mildly dilated.  7. Right atrial size was mildly dilated. Comparison(s): Compared to prior TTE in 01/2021, the LVEF has significantly improved. FINDINGS  Left Ventricle: The. Left ventricular ejection fraction, by estimation, is 45 to 50%. The left ventricle has mildly decreased function.  The left ventricle demonstrates global hypokinesis. Definity contrast agent was given IV to delineate the left ventricular endocardial borders. The left ventricular internal cavity size was normal in size. There is mild concentric left ventricular hypertrophy. Abnormal (paradoxical) septal motion, consistent with left bundle branch block. Elevated left atrial pressure. Right Ventricle: The right ventricular size is normal. No increase in right ventricular wall thickness. Right ventricular systolic function is normal. There is severely elevated pulmonary artery systolic pressure. The tricuspid regurgitant velocity is 4.01 m/s, and with an assumed right atrial pressure of 8 mmHg, the estimated right ventricular systolic pressure is 47.6 mmHg. Left Atrium: Left atrial size was mildly dilated. Right Atrium: Right atrial size was mildly dilated. 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 There is mild thickening of the mitral valve leaflet(s). There is mild calcification of the mitral valve leaflet(s). Moderate mitral annular calcification. Mild mitral valve regurgitation. Tricuspid Valve: The tricuspid valve is normal in structure. Tricuspid valve regurgitation is mild. Aortic Valve: The aortic valve was not well visualized. Aortic valve regurgitation is not visualized. Aortic valve sclerosis is present, with no evidence of aortic valve stenosis. Aorta: The aortic root is normal in size and structure. Venous: The inferior vena cava is dilated in size with greater than 50% respiratory variability, suggesting right atrial pressure of 8 mmHg. LEFT VENTRICLE PLAX 2D LVIDd:         4.80 cm      Diastology LVIDs:         4.00 cm      LV e' medial:    3.30 cm/s LV PW:         1.00 cm      LV E/e' medial:  38.5 LV IVS:        1.00 cm      LV e' lateral:   6.23 cm/s LVOT diam:     1.90 cm      LV E/e' lateral: 20.4 LV SV:         72 LV SV Index:  39 LVOT Area:     2.84 cm  LV Volumes (MOD) LV vol d, MOD A2C: 138.5 ml LV vol  d, MOD A4C: 124.0 ml LV vol s, MOD A2C: 72.0 ml LV vol s, MOD A4C: 66.6 ml LV SV MOD A2C:     66.5 ml LV SV MOD A4C:     124.0 ml LV SV MOD BP:      61.8 ml AORTIC VALVE LVOT Vmax:   102.00 cm/s LVOT Vmean:  65.700 cm/s LVOT VTI:    0.255 m MITRAL VALVE                TRICUSPID VALVE MV Area (PHT): 3.40 cm     TR Peak grad:   64.3 mmHg MV Decel Time: 223 msec     TR Vmax:        401.00 cm/s MV E velocity: 127.00 cm/s MV A velocity: 77.60 cm/s   SHUNTS MV E/A ratio:  1.64         Systemic VTI:  0.26 m                             Systemic Diam: 1.90 cm Gwyndolyn Kaufman MD Electronically signed by Gwyndolyn Kaufman MD Signature Date/Time: 05/02/2021/12:54:48 PM    Final     Microbiology: Results for orders placed or performed during the hospital encounter of 05/01/21  Culture, blood (routine x 2)     Status: None (Preliminary result)   Collection Time: 05/01/21  4:28 PM   Specimen: BLOOD  Result Value Ref Range Status   Specimen Description BLOOD RIGHT ANTECUBITAL  Final   Special Requests   Final    BOTTLES DRAWN AEROBIC AND ANAEROBIC Blood Culture adequate volume   Culture   Final    NO GROWTH < 24 HOURS Performed at [facility], [address] [place], [place] [address]    Report Status PENDING  Incomplete  Resp Panel by RT-PCR (Flu A&B, Covid) Nasopharyngeal Swab     Status: None   Collection Time: 05/01/21  8:18 PM   Specimen: Nasopharyngeal Swab; Nasopharyngeal(NP) swabs in vial transport medium  Result Value Ref Range Status   SARS Coronavirus 2 by RT PCR NEGATIVE NEGATIVE Final    Comment: (NOTE) SARS-CoV-2 target nucleic acids are NOT DETECTED.  The SARS-CoV-2 RNA is generally detectable in upper respiratory specimens during the acute phase of infection. The lowest concentration of SARS-CoV-2 viral copies this assay can detect is 138 copies/mL. A negative result does not preclude SARS-Cov-2 infection and should not be used as the sole basis for treatment or other patient  management decisions. A negative result may occur with  improper specimen collection/handling, submission of specimen other than nasopharyngeal swab, presence of viral mutation(s) within the areas targeted by this assay, and inadequate number of viral copies(<138 copies/mL). A negative result must be combined with clinical observations, patient history, and epidemiological information. The expected result is Negative.  Fact Sheet for Patients:  EntrepreneurPulse.com.au  Fact Sheet for Healthcare Providers:  IncredibleEmployment.be  This test is no t yet approved or cleared by the [place] FDA and  has been authorized for detection and/or diagnosis of SARS-CoV-2 by FDA under an Emergency Use Authorization (EUA). This EUA will remain  in effect (meaning this test can be used) for the duration of the COVID-19 declaration under Section 564(b)(1) of the Act, 21 U.S.C.section 360bbb-3(b)(1), unless the authorization is terminated  or revoked  sooner.       Influenza A by PCR NEGATIVE NEGATIVE Final   Influenza B by PCR NEGATIVE NEGATIVE Final    Comment: (NOTE) The Xpert Xpress SARS-CoV-2/FLU/RSV plus assay is intended as an aid in the diagnosis of influenza from Nasopharyngeal swab specimens and should not be used as a sole basis for treatment. Nasal washings and aspirates are unacceptable for Xpert Xpress SARS-CoV-2/FLU/RSV testing.  Fact Sheet for Patients: EntrepreneurPulse.com.au  Fact Sheet for Healthcare Providers: IncredibleEmployment.be  This test is not yet approved or cleared by the [place] FDA and has been authorized for detection and/or diagnosis of SARS-CoV-2 by FDA under an Emergency Use Authorization (EUA). This EUA will remain in effect (meaning this test can be used) for the duration of the COVID-19 declaration under Section 564(b)(1) of the Act, 21 U.S.C. section 360bbb-3(b)(1),  unless the authorization is terminated or revoked.  Performed at [facility], [place] [address], [place], [place] [address]     Labs: CBC: Recent Labs  Lab 05/01/21 1547  WBC 12.3*  HGB 12.2  HCT 38.1  MCV 96.7  PLT 845   Basic Metabolic Panel: Recent Labs  Lab 05/01/21 1547  NA 139  K 4.0  CL 96*  CO2 28  GLUCOSE 218*  BUN 19  CREATININE 4.88*  CALCIUM 8.6*   Liver Function Tests: No results for input(s): AST, ALT, ALKPHOS, BILITOT, PROT, ALBUMIN in the last 168 hours. CBG: Recent Labs  Lab 05/01/21 2233 05/01/21 2358 05/02/21 0818 05/02/21 1215  GLUCAP 74 118* 135* 135*    Discharge time spent: greater than 30 minutes.  Signed: Flora Lipps, MD [facility] 05/02/2021

## 2021-05-03 DIAGNOSIS — R293 Abnormal posture: Secondary | ICD-10-CM | POA: Diagnosis not present

## 2021-05-03 DIAGNOSIS — M6281 Muscle weakness (generalized): Secondary | ICD-10-CM | POA: Diagnosis not present

## 2021-05-03 DIAGNOSIS — M6258 Muscle wasting and atrophy, not elsewhere classified, other site: Secondary | ICD-10-CM | POA: Diagnosis not present

## 2021-05-03 DIAGNOSIS — R2689 Other abnormalities of gait and mobility: Secondary | ICD-10-CM | POA: Diagnosis not present

## 2021-05-04 DIAGNOSIS — F5101 Primary insomnia: Secondary | ICD-10-CM | POA: Diagnosis not present

## 2021-05-04 DIAGNOSIS — L299 Pruritus, unspecified: Secondary | ICD-10-CM | POA: Diagnosis not present

## 2021-05-04 DIAGNOSIS — N2581 Secondary hyperparathyroidism of renal origin: Secondary | ICD-10-CM | POA: Diagnosis not present

## 2021-05-04 DIAGNOSIS — N186 End stage renal disease: Secondary | ICD-10-CM | POA: Diagnosis not present

## 2021-05-04 DIAGNOSIS — D631 Anemia in chronic kidney disease: Secondary | ICD-10-CM | POA: Diagnosis not present

## 2021-05-04 DIAGNOSIS — Z992 Dependence on renal dialysis: Secondary | ICD-10-CM | POA: Diagnosis not present

## 2021-05-04 LAB — CULTURE, BLOOD (ROUTINE X 2): Special Requests: ADEQUATE

## 2021-05-05 ENCOUNTER — Encounter (HOSPITAL_COMMUNITY): Payer: HMO | Admitting: Cardiology

## 2021-05-05 DIAGNOSIS — I4891 Unspecified atrial fibrillation: Secondary | ICD-10-CM | POA: Diagnosis not present

## 2021-05-05 DIAGNOSIS — I959 Hypotension, unspecified: Secondary | ICD-10-CM | POA: Diagnosis not present

## 2021-05-05 DIAGNOSIS — I10 Essential (primary) hypertension: Secondary | ICD-10-CM | POA: Diagnosis not present

## 2021-05-05 DIAGNOSIS — N390 Urinary tract infection, site not specified: Secondary | ICD-10-CM | POA: Diagnosis not present

## 2021-05-05 DIAGNOSIS — R079 Chest pain, unspecified: Secondary | ICD-10-CM | POA: Diagnosis not present

## 2021-05-05 DIAGNOSIS — R55 Syncope and collapse: Secondary | ICD-10-CM | POA: Diagnosis not present

## 2021-05-06 DIAGNOSIS — I509 Heart failure, unspecified: Secondary | ICD-10-CM | POA: Diagnosis not present

## 2021-05-06 DIAGNOSIS — M159 Polyosteoarthritis, unspecified: Secondary | ICD-10-CM | POA: Diagnosis not present

## 2021-05-06 DIAGNOSIS — I4891 Unspecified atrial fibrillation: Secondary | ICD-10-CM | POA: Diagnosis not present

## 2021-05-06 DIAGNOSIS — N186 End stage renal disease: Secondary | ICD-10-CM | POA: Diagnosis not present

## 2021-05-06 DIAGNOSIS — E669 Obesity, unspecified: Secondary | ICD-10-CM | POA: Diagnosis not present

## 2021-05-06 DIAGNOSIS — E119 Type 2 diabetes mellitus without complications: Secondary | ICD-10-CM | POA: Diagnosis not present

## 2021-05-06 DIAGNOSIS — M6281 Muscle weakness (generalized): Secondary | ICD-10-CM | POA: Diagnosis not present

## 2021-05-06 DIAGNOSIS — M5459 Other low back pain: Secondary | ICD-10-CM | POA: Diagnosis not present

## 2021-05-07 DIAGNOSIS — I10 Essential (primary) hypertension: Secondary | ICD-10-CM | POA: Diagnosis not present

## 2021-05-07 LAB — CULTURE, BLOOD (ROUTINE X 2): Culture: NO GROWTH

## 2021-05-11 ENCOUNTER — Encounter (HOSPITAL_COMMUNITY): Payer: Self-pay | Admitting: *Deleted

## 2021-05-11 ENCOUNTER — Emergency Department (HOSPITAL_COMMUNITY)
Admission: EM | Admit: 2021-05-11 | Discharge: 2021-05-12 | Disposition: A | Discharge status: Skilled Nursing Facility | Payer: HMO | Attending: Emergency Medicine | Admitting: Emergency Medicine

## 2021-05-11 ENCOUNTER — Other Ambulatory Visit: Payer: Self-pay

## 2021-05-11 ENCOUNTER — Emergency Department (HOSPITAL_COMMUNITY): Payer: HMO

## 2021-05-11 DIAGNOSIS — M79622 Pain in left upper arm: Secondary | ICD-10-CM | POA: Insufficient documentation

## 2021-05-11 DIAGNOSIS — Z992 Dependence on renal dialysis: Secondary | ICD-10-CM | POA: Insufficient documentation

## 2021-05-11 DIAGNOSIS — R55 Syncope and collapse: Secondary | ICD-10-CM | POA: Insufficient documentation

## 2021-05-11 DIAGNOSIS — Z20822 Contact with and (suspected) exposure to covid-19: Secondary | ICD-10-CM | POA: Diagnosis not present

## 2021-05-11 DIAGNOSIS — Z7901 Long term (current) use of anticoagulants: Secondary | ICD-10-CM | POA: Diagnosis not present

## 2021-05-11 DIAGNOSIS — L299 Pruritus, unspecified: Secondary | ICD-10-CM | POA: Diagnosis not present

## 2021-05-11 DIAGNOSIS — N186 End stage renal disease: Secondary | ICD-10-CM | POA: Diagnosis not present

## 2021-05-11 DIAGNOSIS — E669 Obesity, unspecified: Secondary | ICD-10-CM | POA: Diagnosis not present

## 2021-05-11 DIAGNOSIS — D509 Iron deficiency anemia, unspecified: Secondary | ICD-10-CM | POA: Diagnosis not present

## 2021-05-11 DIAGNOSIS — I517 Cardiomegaly: Secondary | ICD-10-CM | POA: Diagnosis not present

## 2021-05-11 DIAGNOSIS — Z794 Long term (current) use of insulin: Secondary | ICD-10-CM | POA: Diagnosis not present

## 2021-05-11 DIAGNOSIS — I4891 Unspecified atrial fibrillation: Secondary | ICD-10-CM | POA: Diagnosis not present

## 2021-05-11 DIAGNOSIS — E119 Type 2 diabetes mellitus without complications: Secondary | ICD-10-CM | POA: Diagnosis not present

## 2021-05-11 DIAGNOSIS — M159 Polyosteoarthritis, unspecified: Secondary | ICD-10-CM | POA: Diagnosis not present

## 2021-05-11 DIAGNOSIS — M5459 Other low back pain: Secondary | ICD-10-CM | POA: Diagnosis not present

## 2021-05-11 DIAGNOSIS — N2581 Secondary hyperparathyroidism of renal origin: Secondary | ICD-10-CM | POA: Diagnosis not present

## 2021-05-11 DIAGNOSIS — R202 Paresthesia of skin: Secondary | ICD-10-CM | POA: Diagnosis not present

## 2021-05-11 DIAGNOSIS — M6281 Muscle weakness (generalized): Secondary | ICD-10-CM | POA: Diagnosis not present

## 2021-05-11 DIAGNOSIS — D631 Anemia in chronic kidney disease: Secondary | ICD-10-CM | POA: Diagnosis not present

## 2021-05-11 DIAGNOSIS — I959 Hypotension, unspecified: Secondary | ICD-10-CM | POA: Diagnosis not present

## 2021-05-11 DIAGNOSIS — I509 Heart failure, unspecified: Secondary | ICD-10-CM | POA: Diagnosis not present

## 2021-05-11 LAB — COMPREHENSIVE METABOLIC PANEL
ALT: 19 U/L (ref 0–44)
AST: 21 U/L (ref 15–41)
Albumin: 3.2 g/dL — ABNORMAL LOW (ref 3.5–5.0)
Alkaline Phosphatase: 98 U/L (ref 38–126)
Anion gap: 12 (ref 5–15)
BUN: 13 mg/dL (ref 8–23)
CO2: 31 mmol/L (ref 22–32)
Calcium: 8.5 mg/dL — ABNORMAL LOW (ref 8.9–10.3)
Chloride: 94 mmol/L — ABNORMAL LOW (ref 98–111)
Creatinine, Ser: 4.4 mg/dL — ABNORMAL HIGH (ref 0.44–1.00)
GFR, Estimated: 10 mL/min — ABNORMAL LOW (ref >60)
Glucose, Bld: 145 mg/dL — ABNORMAL HIGH (ref 70–99)
Potassium: 3.7 mmol/L (ref 3.5–5.1)
Sodium: 137 mmol/L (ref 135–145)
Total Bilirubin: 0.5 mg/dL (ref 0.3–1.2)
Total Protein: 7.5 g/dL (ref 6.5–8.1)

## 2021-05-11 LAB — RESP PANEL BY RT-PCR (FLU A&B, COVID) ARPGX2
Influenza A by PCR: NEGATIVE
Influenza B by PCR: NEGATIVE
SARS Coronavirus 2 by RT PCR: NEGATIVE

## 2021-05-11 LAB — CBC WITH DIFFERENTIAL/PLATELET
Abs Immature Granulocytes: 0.04 10*3/uL (ref 0.00–0.07)
Basophils Absolute: 0.1 10*3/uL (ref 0.0–0.1)
Basophils Relative: 1 %
Eosinophils Absolute: 0.2 10*3/uL (ref 0.0–0.5)
Eosinophils Relative: 1 %
HCT: 38.3 % (ref 36.0–46.0)
Hemoglobin: 12.1 g/dL (ref 12.0–15.0)
Immature Granulocytes: 0 %
Lymphocytes Relative: 13 %
Lymphs Abs: 1.6 10*3/uL (ref 0.7–4.0)
MCH: 30.7 pg (ref 26.0–34.0)
MCHC: 31.6 g/dL (ref 30.0–36.0)
MCV: 97.2 fL (ref 80.0–100.0)
Monocytes Absolute: 1.1 10*3/uL — ABNORMAL HIGH (ref 0.1–1.0)
Monocytes Relative: 9 %
Neutro Abs: 9.1 10*3/uL — ABNORMAL HIGH (ref 1.7–7.7)
Neutrophils Relative %: 76 %
Platelets: 265 10*3/uL (ref 150–400)
RBC: 3.94 MIL/uL (ref 3.87–5.11)
RDW: 15.1 % (ref 11.5–15.5)
WBC: 12.1 10*3/uL — ABNORMAL HIGH (ref 4.0–10.5)
nRBC: 0 % (ref 0.0–0.2)

## 2021-05-11 LAB — TROPONIN I (HIGH SENSITIVITY)
Troponin I (High Sensitivity): 48 ng/L — ABNORMAL HIGH (ref <18)
Troponin I (High Sensitivity): 47 ng/L — ABNORMAL HIGH (ref <18)

## 2021-05-11 NOTE — Discharge Instructions (Signed)
Return for any problem.  ?

## 2021-05-11 NOTE — ED Notes (Signed)
States, "make very little urine every 2-3 days". HD MWF. AVG L upper arm.  ?

## 2021-05-11 NOTE — ED Triage Notes (Signed)
HD present on arrival. BIB [facility] from [facility], here for <1 minute of syncope at the end of HD, received full tx, 2177cc pulled off, A&O for EMS, VSS, NSR with LBBB, Mentions L hand tingling/pain while AVG accessed, arrives denying pain, sob, nausea or dizziness, denies complaints. Mentions fell x2 in last month. Alert, NAD, calm, interactive, skin W&D. Daughter into room. ?

## 2021-05-11 NOTE — ED Notes (Signed)
Lab at BS 

## 2021-05-11 NOTE — ED Notes (Signed)
Xray at [facility] 

## 2021-05-11 NOTE — ED Notes (Signed)
Called [facility] to transport patient to [facility] 

## 2021-05-11 NOTE — ED Provider Notes (Signed)
?[place] [place] ?Provider Note ? ? ?CSN: [id number] ?Arrival date & time: 05/11/21  1707 ? ?  ? ?History ? ?Chief Complaint  ?Patient presents with  ? Loss of Consciousness  ? ? ?Stephanie Woodward is a 81 y.o. female. ? ?81 year old female with prior medical history as detailed below presents for evaluation.  Patient reports that she was at her dialysis session earlier today.  She had completed the majority of her session.  As she was finishing her dialysis session she experienced some sharp pain in the left hand.  She did had a very brief episode of syncope.  She reports that she was " out of it" for less than 1 minute.  She denies any preceding chest pain or shortness of breath.  She reports no complaint at time of evaluation. ? ?She denies recent illness. ? ?EMS reports that the patient had about 2177 cc taken off.  Apparently her left hand had some tingling discomfort when the AV fistula was accessed in her left upper arm.  Apparently, the patient had the same pain in the left hand immediately prior to her syncopal event. ? ?The history is provided by the patient and medical records.  ?Loss of Consciousness ?Episode history:  Single ?Most recent episode:  Today ?Duration:  1 minute ?Timing:  Rare ?Progression:  Resolved ?Chronicity:  New ?Context comment:  Pain in left hand related to access of dialysis graft in left upper arm ? ?  ? ?Home Medications ?Prior to Admission medications   ?Medication Sig Start Date End Date Taking? Authorizing Provider  ?acetaminophen (TYLENOL) 500 MG tablet Take 1,000 mg by mouth daily.    [provider]  ?albuterol (PROVENTIL HFA;VENTOLIN HFA) 108 (90 BASE) MCG/ACT inhaler Inhale 2 puffs into the lungs in the morning and at bedtime.    [provider]  ?amiodarone (PACERONE) 200 MG tablet Take 200 mg by mouth daily.    [provider]  ?atorvastatin (LIPITOR) 80 MG tablet Take 80 mg by mouth at bedtime.    [provider]  ?Blood Glucose Monitoring Suppl (FREESTYLE LITE) DEVI Use as instructed to check blood sugar 4 times daily ?Patient taking differently: 1 each by Other route See admin instructions. Use as instructed to check blood sugar 4 times daily 12/29/20   Shamleffer, Melanie Crazier, MD  ?budesonide-formoterol [facility]) 160-4.5 MCG/ACT inhaler Inhale 2 puffs into the lungs 2 (two) times daily. 04/09/20   Collene Gobble, MD  ?Continuous Blood Gluc Receiver (FREESTYLE LIBRE READER) DEVI 1 kit by Does not apply route daily. Use as instructed E11.65 09/12/19   Shamleffer, Melanie Crazier, MD  ?diphenhydrAMINE (BENADRYL) 25 mg capsule Take 25 mg by mouth every 6 (six) hours as needed for itching.    [provider]  ?ELIQUIS 5 MG TABS tablet Take 1 tablet by mouth twice daily ?Patient taking differently: Take 5 mg by mouth 2 (two) times daily. 07/23/20   Larey Dresser, MD  ?glucose blood (FREESTYLE LITE) test strip USE AS DIRECTED 4 TIMES DAILY ?Patient taking differently: 1 each by Other route See admin instructions. USE AS DIRECTED 4 TIMES DAILY 02/02/21   Shamleffer, Melanie Crazier, MD  ?insulin aspart (NOVOLOG) 100 UNIT/ML injection CBG 70 - 120: 0 units CBG 121 - 150: 1 unit CBG 151 - 200: 2 units CBG 201 - 250: 3 units CBG 251 - 300: 5 units CBG 301 - 350: 7 units CBG 351 - 400: 9 units ?Patient taking differently: Inject  0-12 Units into the skin See admin instructions. Per sliding scale bid ?70-200 = 0 units ?201-250 = 2 units ?251-300 = 4 units ?301-350 = 6 units ?351-400 = 8 units ?401-450 = 10 units ?451-600 = 12 units 03/05/21   Hosie Poisson, MD  ?insulin degludec (TRESIBA FLEXTOUCH) 100 UNIT/ML FlexTouch Pen Inject 18 Units into the skin daily. ?Patient taking differently: Inject 22 Units into the skin daily. 02/06/20   Shamleffer, Melanie Crazier, MD  ?Insulin Pen Needle 32G X 4 MM MISC 1 Device by Does not apply route in the morning, at noon, in the evening, and at bedtime. 02/06/20    Shamleffer, Melanie Crazier, MD  ?ketoconazole (NIZORAL) 2 % cream Apply 1 application topically daily. 02/16/21   [provider]  ?latanoprost (XALATAN) 0.005 % ophthalmic solution Place 1 drop into both eyes at bedtime.    [provider]  ?lidocaine (LINDAMANTLE) 3 % CREA cream Apply 1 application topically every Monday, Wednesday, and Friday. Prior to dialysis 03/06/20   [provider]  ?Lidocaine 4 % PTCH Place 1 patch onto the skin daily. 11/12/20   [provider]  ?Menthol, Topical Analgesic, (BIOFREEZE) 4 % GEL Apply 1 application. topically in the morning, at noon, and at bedtime.    [provider]  ?midodrine (PROAMATINE) 10 MG tablet Take 10 mg by mouth 3 (three) times daily. Hold if SBP>130    [provider]  ?Multiple Vitamin (MULTI-VITAMIN PO) Take 1 tablet by mouth daily.    [provider]  ?multivitamin (RENA-VIT) TABS tablet Take 1 tablet by mouth at bedtime. ?Patient not taking: Reported on 04/25/2021 03/05/21   Hosie Poisson, MD  ?Nutritional Supplements (FEEDING SUPPLEMENT, NEPRO CARB STEADY,) LIQD Take 237 mLs by mouth 3 (three) times daily with meals. 03/05/21 06/03/21  Hosie Poisson, MD  ?pantoprazole (PROTONIX) 40 MG tablet Take 40 mg by mouth at bedtime. 10/26/18   [provider]  ?Pramoxine-Calamine (AVEENO ANTI-ITCH EX) Apply 1 application topically every 6 (six) hours as needed (itching).    [provider]  ?senna-docusate (SENOKOT-S) 8.6-50 MG tablet Take 1 tablet by mouth at bedtime as needed for mild constipation. ?Patient not taking: Reported on 04/25/2021 03/05/21   Hosie Poisson, MD  ?sucroferric oxyhydroxide (VELPHORO) 500 MG chewable tablet Chew 500 mg by mouth 3 (three) times daily with meals.    [provider]  ?traZODone (DESYREL) 50 MG tablet Take 25 mg by mouth at bedtime.    [provider]  ?   ? ?Allergies    ?Eggs or egg-derived products, Lisinopril, Penicillins, and Other    ? ?Review of Systems   ?Review of Systems  ?Cardiovascular:  Positive for syncope.  ?All other systems reviewed and are negative. ? ?Physical Exam ?Updated Vital Signs ?BP (!) 118/40   Pulse 64   Temp 98.9 ?F (37.2 ?C) (Oral)   Resp 14   Wt 85.3 kg   SpO2 98%   BMI 33.30 kg/m?  ?Physical Exam ?Vitals and nursing note reviewed.  ?Constitutional:   ?   General: She is not in acute distress. ?   Appearance: Normal appearance. She is well-developed.  ?HENT:  ?   Head: Normocephalic and atraumatic.  ?Eyes:  ?   Conjunctiva/sclera: Conjunctivae normal.  ?   Pupils: Pupils are equal, round, and reactive to light.  ?Cardiovascular:  ?   Rate and Rhythm: Normal rate and regular rhythm.  ?   Heart sounds: Normal heart sounds.  ?Pulmonary:  ?  Effort: Pulmonary effort is normal. No respiratory distress.  ?   Breath sounds: Normal breath sounds.  ?Abdominal:  ?   General: There is no distension.  ?   Palpations: Abdomen is soft.  ?   Tenderness: There is no abdominal tenderness.  ?Musculoskeletal:     ?   General: No deformity. Normal range of motion.  ?   Cervical back: Normal range of motion and neck supple.  ?Skin: ?   General: Skin is warm and dry.  ?   Comments: AV fistula left upper arm is dressed appropriately.  Palpable thrill present.  No surrounding erythema or hematoma noted.  Distal left upper extremity is neurovascular intact.  ?Neurological:  ?   General: No focal deficit present.  ?   Mental Status: She is alert and oriented to person, place, and time.  ? ? ?ED Results / Procedures / Treatments   ?Labs ?(all labs ordered are listed, but only abnormal results are displayed) ?Labs Reviewed  ?CBC WITH DIFFERENTIAL/PLATELET - Abnormal; Notable for the following components:  ?    Result Value  ? WBC 12.1 (*)   ? Neutro Abs 9.1 (*)   ? Monocytes Absolute 1.1 (*)   ? All other components within normal limits  ?COMPREHENSIVE METABOLIC PANEL - Abnormal; Notable for the following components:  ? Chloride 94 (*)   ?  Glucose, Bld 145 (*)   ? Creatinine, Ser 4.40 (*)   ? Calcium 8.5 (*)   ? Albumin 3.2 (*)   ? GFR, Estimated 10 (*)   ? All other components within normal limits  ?TROPONIN I (HIGH SENSITIVITY) - Abnormal; Notable for th

## 2021-05-12 NOTE — ED Notes (Signed)
DC paperwork reviewed with Daughter and given to [facility]. ?

## 2021-05-14 DIAGNOSIS — R55 Syncope and collapse: Secondary | ICD-10-CM | POA: Diagnosis not present

## 2021-05-14 DIAGNOSIS — N186 End stage renal disease: Secondary | ICD-10-CM | POA: Diagnosis not present

## 2021-05-14 DIAGNOSIS — F330 Major depressive disorder, recurrent, mild: Secondary | ICD-10-CM | POA: Diagnosis not present

## 2021-05-14 DIAGNOSIS — E1122 Type 2 diabetes mellitus with diabetic chronic kidney disease: Secondary | ICD-10-CM | POA: Diagnosis not present

## 2021-05-14 DIAGNOSIS — I4891 Unspecified atrial fibrillation: Secondary | ICD-10-CM | POA: Diagnosis not present

## 2021-05-18 DIAGNOSIS — I4891 Unspecified atrial fibrillation: Secondary | ICD-10-CM | POA: Diagnosis not present

## 2021-05-18 DIAGNOSIS — R52 Pain, unspecified: Secondary | ICD-10-CM | POA: Diagnosis not present

## 2021-05-18 DIAGNOSIS — N186 End stage renal disease: Secondary | ICD-10-CM | POA: Diagnosis not present

## 2021-05-18 DIAGNOSIS — E669 Obesity, unspecified: Secondary | ICD-10-CM | POA: Diagnosis not present

## 2021-05-18 DIAGNOSIS — N2581 Secondary hyperparathyroidism of renal origin: Secondary | ICD-10-CM | POA: Diagnosis not present

## 2021-05-18 DIAGNOSIS — Z992 Dependence on renal dialysis: Secondary | ICD-10-CM | POA: Diagnosis not present

## 2021-05-18 DIAGNOSIS — M5459 Other low back pain: Secondary | ICD-10-CM | POA: Diagnosis not present

## 2021-05-18 DIAGNOSIS — M6281 Muscle weakness (generalized): Secondary | ICD-10-CM | POA: Diagnosis not present

## 2021-05-18 DIAGNOSIS — E119 Type 2 diabetes mellitus without complications: Secondary | ICD-10-CM | POA: Diagnosis not present

## 2021-05-18 DIAGNOSIS — L299 Pruritus, unspecified: Secondary | ICD-10-CM | POA: Diagnosis not present

## 2021-05-18 DIAGNOSIS — M159 Polyosteoarthritis, unspecified: Secondary | ICD-10-CM | POA: Diagnosis not present

## 2021-05-18 DIAGNOSIS — I509 Heart failure, unspecified: Secondary | ICD-10-CM | POA: Diagnosis not present

## 2021-05-18 DIAGNOSIS — D631 Anemia in chronic kidney disease: Secondary | ICD-10-CM | POA: Diagnosis not present

## 2021-05-21 DIAGNOSIS — M1990 Unspecified osteoarthritis, unspecified site: Secondary | ICD-10-CM | POA: Diagnosis not present

## 2021-05-21 DIAGNOSIS — M25569 Pain in unspecified knee: Secondary | ICD-10-CM | POA: Diagnosis not present

## 2021-05-21 DIAGNOSIS — M549 Dorsalgia, unspecified: Secondary | ICD-10-CM | POA: Diagnosis not present

## 2021-05-21 DIAGNOSIS — M459 Ankylosing spondylitis of unspecified sites in spine: Secondary | ICD-10-CM | POA: Diagnosis not present

## 2021-05-21 DIAGNOSIS — I5042 Chronic combined systolic (congestive) and diastolic (congestive) heart failure: Secondary | ICD-10-CM | POA: Diagnosis not present

## 2021-05-21 DIAGNOSIS — L409 Psoriasis, unspecified: Secondary | ICD-10-CM | POA: Diagnosis not present

## 2021-05-21 DIAGNOSIS — L4050 Arthropathic psoriasis, unspecified: Secondary | ICD-10-CM | POA: Diagnosis not present

## 2021-05-21 DIAGNOSIS — M25461 Effusion, right knee: Secondary | ICD-10-CM | POA: Diagnosis not present

## 2021-05-21 DIAGNOSIS — Z79899 Other long term (current) drug therapy: Secondary | ICD-10-CM | POA: Diagnosis not present

## 2021-05-21 DIAGNOSIS — N186 End stage renal disease: Secondary | ICD-10-CM | POA: Diagnosis not present

## 2021-05-22 DIAGNOSIS — I129 Hypertensive chronic kidney disease with stage 1 through stage 4 chronic kidney disease, or unspecified chronic kidney disease: Secondary | ICD-10-CM | POA: Diagnosis not present

## 2021-05-22 DIAGNOSIS — N186 End stage renal disease: Secondary | ICD-10-CM | POA: Diagnosis not present

## 2021-05-22 DIAGNOSIS — Z992 Dependence on renal dialysis: Secondary | ICD-10-CM | POA: Diagnosis not present

## 2021-05-25 DIAGNOSIS — R52 Pain, unspecified: Secondary | ICD-10-CM | POA: Diagnosis not present

## 2021-05-25 DIAGNOSIS — N2581 Secondary hyperparathyroidism of renal origin: Secondary | ICD-10-CM | POA: Diagnosis not present

## 2021-05-25 DIAGNOSIS — R2689 Other abnormalities of gait and mobility: Secondary | ICD-10-CM | POA: Diagnosis not present

## 2021-05-25 DIAGNOSIS — R293 Abnormal posture: Secondary | ICD-10-CM | POA: Diagnosis not present

## 2021-05-25 DIAGNOSIS — M6258 Muscle wasting and atrophy, not elsewhere classified, other site: Secondary | ICD-10-CM | POA: Diagnosis not present

## 2021-05-25 DIAGNOSIS — D631 Anemia in chronic kidney disease: Secondary | ICD-10-CM | POA: Diagnosis not present

## 2021-05-25 DIAGNOSIS — N186 End stage renal disease: Secondary | ICD-10-CM | POA: Diagnosis not present

## 2021-05-25 DIAGNOSIS — M6281 Muscle weakness (generalized): Secondary | ICD-10-CM | POA: Diagnosis not present

## 2021-05-25 DIAGNOSIS — Z992 Dependence on renal dialysis: Secondary | ICD-10-CM | POA: Diagnosis not present

## 2021-05-25 DIAGNOSIS — L299 Pruritus, unspecified: Secondary | ICD-10-CM | POA: Diagnosis not present

## 2021-05-26 DIAGNOSIS — Z992 Dependence on renal dialysis: Secondary | ICD-10-CM | POA: Diagnosis not present

## 2021-05-26 DIAGNOSIS — Z794 Long term (current) use of insulin: Secondary | ICD-10-CM | POA: Diagnosis not present

## 2021-05-26 DIAGNOSIS — M459 Ankylosing spondylitis of unspecified sites in spine: Secondary | ICD-10-CM | POA: Diagnosis not present

## 2021-05-26 DIAGNOSIS — N186 End stage renal disease: Secondary | ICD-10-CM | POA: Diagnosis not present

## 2021-05-26 DIAGNOSIS — E611 Iron deficiency: Secondary | ICD-10-CM | POA: Diagnosis not present

## 2021-05-26 DIAGNOSIS — L4050 Arthropathic psoriasis, unspecified: Secondary | ICD-10-CM | POA: Diagnosis not present

## 2021-05-28 DIAGNOSIS — I10 Essential (primary) hypertension: Secondary | ICD-10-CM | POA: Diagnosis not present

## 2021-06-01 DIAGNOSIS — E119 Type 2 diabetes mellitus without complications: Secondary | ICD-10-CM | POA: Diagnosis not present

## 2021-06-01 DIAGNOSIS — D509 Iron deficiency anemia, unspecified: Secondary | ICD-10-CM | POA: Diagnosis not present

## 2021-06-01 DIAGNOSIS — D631 Anemia in chronic kidney disease: Secondary | ICD-10-CM | POA: Diagnosis not present

## 2021-06-01 DIAGNOSIS — M159 Polyosteoarthritis, unspecified: Secondary | ICD-10-CM | POA: Diagnosis not present

## 2021-06-01 DIAGNOSIS — N2581 Secondary hyperparathyroidism of renal origin: Secondary | ICD-10-CM | POA: Diagnosis not present

## 2021-06-01 DIAGNOSIS — M5459 Other low back pain: Secondary | ICD-10-CM | POA: Diagnosis not present

## 2021-06-01 DIAGNOSIS — I509 Heart failure, unspecified: Secondary | ICD-10-CM | POA: Diagnosis not present

## 2021-06-01 DIAGNOSIS — N186 End stage renal disease: Secondary | ICD-10-CM | POA: Diagnosis not present

## 2021-06-01 DIAGNOSIS — M6281 Muscle weakness (generalized): Secondary | ICD-10-CM | POA: Diagnosis not present

## 2021-06-01 DIAGNOSIS — I4891 Unspecified atrial fibrillation: Secondary | ICD-10-CM | POA: Diagnosis not present

## 2021-06-01 DIAGNOSIS — L299 Pruritus, unspecified: Secondary | ICD-10-CM | POA: Diagnosis not present

## 2021-06-01 DIAGNOSIS — E1122 Type 2 diabetes mellitus with diabetic chronic kidney disease: Secondary | ICD-10-CM | POA: Diagnosis not present

## 2021-06-01 DIAGNOSIS — E669 Obesity, unspecified: Secondary | ICD-10-CM | POA: Diagnosis not present

## 2021-06-01 DIAGNOSIS — Z992 Dependence on renal dialysis: Secondary | ICD-10-CM | POA: Diagnosis not present

## 2021-06-03 DIAGNOSIS — M5459 Other low back pain: Secondary | ICD-10-CM | POA: Diagnosis not present

## 2021-06-03 DIAGNOSIS — M159 Polyosteoarthritis, unspecified: Secondary | ICD-10-CM | POA: Diagnosis not present

## 2021-06-03 DIAGNOSIS — E1169 Type 2 diabetes mellitus with other specified complication: Secondary | ICD-10-CM | POA: Diagnosis not present

## 2021-06-03 DIAGNOSIS — E119 Type 2 diabetes mellitus without complications: Secondary | ICD-10-CM | POA: Diagnosis not present

## 2021-06-03 DIAGNOSIS — I959 Hypotension, unspecified: Secondary | ICD-10-CM | POA: Diagnosis not present

## 2021-06-03 DIAGNOSIS — E669 Obesity, unspecified: Secondary | ICD-10-CM | POA: Diagnosis not present

## 2021-06-03 DIAGNOSIS — M6281 Muscle weakness (generalized): Secondary | ICD-10-CM | POA: Diagnosis not present

## 2021-06-03 DIAGNOSIS — I509 Heart failure, unspecified: Secondary | ICD-10-CM | POA: Diagnosis not present

## 2021-06-03 DIAGNOSIS — I5042 Chronic combined systolic (congestive) and diastolic (congestive) heart failure: Secondary | ICD-10-CM | POA: Diagnosis not present

## 2021-06-03 DIAGNOSIS — E611 Iron deficiency: Secondary | ICD-10-CM | POA: Diagnosis not present

## 2021-06-03 DIAGNOSIS — J449 Chronic obstructive pulmonary disease, unspecified: Secondary | ICD-10-CM | POA: Diagnosis not present

## 2021-06-03 DIAGNOSIS — N186 End stage renal disease: Secondary | ICD-10-CM | POA: Diagnosis not present

## 2021-06-03 DIAGNOSIS — I4891 Unspecified atrial fibrillation: Secondary | ICD-10-CM | POA: Diagnosis not present

## 2021-06-03 DIAGNOSIS — N9489 Other specified conditions associated with female genital organs and menstrual cycle: Secondary | ICD-10-CM | POA: Diagnosis not present

## 2021-06-03 DIAGNOSIS — E278 Other specified disorders of adrenal gland: Secondary | ICD-10-CM | POA: Diagnosis not present

## 2021-06-04 DIAGNOSIS — Z992 Dependence on renal dialysis: Secondary | ICD-10-CM | POA: Diagnosis not present

## 2021-06-04 DIAGNOSIS — N186 End stage renal disease: Secondary | ICD-10-CM | POA: Diagnosis not present

## 2021-06-04 DIAGNOSIS — L219 Seborrheic dermatitis, unspecified: Secondary | ICD-10-CM | POA: Diagnosis not present

## 2021-06-05 DIAGNOSIS — E1169 Type 2 diabetes mellitus with other specified complication: Secondary | ICD-10-CM | POA: Diagnosis not present

## 2021-06-05 DIAGNOSIS — Z992 Dependence on renal dialysis: Secondary | ICD-10-CM | POA: Diagnosis not present

## 2021-06-05 DIAGNOSIS — N186 End stage renal disease: Secondary | ICD-10-CM | POA: Diagnosis not present

## 2021-06-05 DIAGNOSIS — R0989 Other specified symptoms and signs involving the circulatory and respiratory systems: Secondary | ICD-10-CM | POA: Diagnosis not present

## 2021-06-07 ENCOUNTER — Other Ambulatory Visit: Payer: Self-pay

## 2021-06-07 ENCOUNTER — Emergency Department (HOSPITAL_COMMUNITY)
Admission: EM | Admit: 2021-06-07 | Discharge: 2021-06-07 | Disposition: A | Discharge status: Home / Self Care | Payer: HMO | Attending: Emergency Medicine | Admitting: Emergency Medicine

## 2021-06-07 ENCOUNTER — Emergency Department (HOSPITAL_COMMUNITY): Payer: HMO

## 2021-06-07 ENCOUNTER — Encounter (HOSPITAL_COMMUNITY): Payer: Self-pay | Admitting: Emergency Medicine

## 2021-06-07 DIAGNOSIS — Z992 Dependence on renal dialysis: Secondary | ICD-10-CM | POA: Diagnosis not present

## 2021-06-07 DIAGNOSIS — R55 Syncope and collapse: Secondary | ICD-10-CM | POA: Diagnosis not present

## 2021-06-07 DIAGNOSIS — W1812XA Fall from or off toilet with subsequent striking against object, initial encounter: Secondary | ICD-10-CM | POA: Diagnosis not present

## 2021-06-07 DIAGNOSIS — M79603 Pain in arm, unspecified: Secondary | ICD-10-CM | POA: Diagnosis not present

## 2021-06-07 DIAGNOSIS — S40012A Contusion of left shoulder, initial encounter: Secondary | ICD-10-CM | POA: Diagnosis not present

## 2021-06-07 DIAGNOSIS — Z794 Long term (current) use of insulin: Secondary | ICD-10-CM | POA: Insufficient documentation

## 2021-06-07 DIAGNOSIS — S0990XA Unspecified injury of head, initial encounter: Secondary | ICD-10-CM

## 2021-06-07 DIAGNOSIS — S8002XA Contusion of left knee, initial encounter: Secondary | ICD-10-CM | POA: Insufficient documentation

## 2021-06-07 DIAGNOSIS — S4992XA Unspecified injury of left shoulder and upper arm, initial encounter: Secondary | ICD-10-CM | POA: Diagnosis present

## 2021-06-07 DIAGNOSIS — N186 End stage renal disease: Secondary | ICD-10-CM | POA: Insufficient documentation

## 2021-06-07 DIAGNOSIS — Y92129 Unspecified place in nursing home as the place of occurrence of the external cause: Secondary | ICD-10-CM | POA: Insufficient documentation

## 2021-06-07 DIAGNOSIS — G319 Degenerative disease of nervous system, unspecified: Secondary | ICD-10-CM | POA: Diagnosis not present

## 2021-06-07 DIAGNOSIS — Z7901 Long term (current) use of anticoagulants: Secondary | ICD-10-CM | POA: Diagnosis not present

## 2021-06-07 DIAGNOSIS — W19XXXA Unspecified fall, initial encounter: Secondary | ICD-10-CM | POA: Diagnosis not present

## 2021-06-07 DIAGNOSIS — M1712 Unilateral primary osteoarthritis, left knee: Secondary | ICD-10-CM | POA: Diagnosis not present

## 2021-06-07 DIAGNOSIS — M25512 Pain in left shoulder: Secondary | ICD-10-CM | POA: Diagnosis not present

## 2021-06-07 DIAGNOSIS — M25519 Pain in unspecified shoulder: Secondary | ICD-10-CM | POA: Diagnosis not present

## 2021-06-07 DIAGNOSIS — R58 Hemorrhage, not elsewhere classified: Secondary | ICD-10-CM | POA: Diagnosis not present

## 2021-06-07 NOTE — Progress Notes (Signed)
Orthopedic Tech Progress Note ?Patient Details:  ?Karista Aispuro ?Oct 16, 1940 ?[id number] ? ?Patient ID: Saylee Sherrill, female   DOB: 06-Jul-1940, 81 y.o.   MRN: [id number] ?I attended trauma page ?Karolee Stamps ?06/07/2021, 2:14 AM ? ?

## 2021-06-07 NOTE — ED Triage Notes (Signed)
Pt arrive by EMS from [facility] rehab center after having a fall., pt on blood thinners. ?

## 2021-06-07 NOTE — ED Provider Notes (Signed)
? ?[place]  ?Provider Note ? ?CSN: [id number] ?Arrival date & time: 06/07/21 0121 ? ?History ?Chief Complaint  ?Patient presents with  ? Fall  ? ? ?Stephanie Woodward is a 81 y.o. female with history of ESRD on HD MWF and Eliquis for Afib was brought to the ED after she fell trying to transfer from toilet back to wheelchair, falling backwards, striking her head and landing on her L side. Did not have LOC. She is complaining of moderate aching pain in L shoulder and L knee. No neck pain.  ? ? ?Home Medications ?Prior to Admission medications   ?Medication Sig Start Date End Date Taking? Authorizing Provider  ?acetaminophen (TYLENOL) 500 MG tablet Take 1,000 mg by mouth daily.    [provider]  ?albuterol (PROVENTIL HFA;VENTOLIN HFA) 108 (90 BASE) MCG/ACT inhaler Inhale 2 puffs into the lungs in the morning and at bedtime.    [provider]  ?amiodarone (PACERONE) 200 MG tablet Take 200 mg by mouth daily.    [provider]  ?atorvastatin (LIPITOR) 80 MG tablet Take 80 mg by mouth at bedtime.    [provider]  ?Blood Glucose Monitoring Suppl (FREESTYLE LITE) DEVI Use as instructed to check blood sugar 4 times daily ?Patient taking differently: 1 each by Other route See admin instructions. Use as instructed to check blood sugar 4 times daily 12/29/20   Shamleffer, Melanie Crazier, MD  ?budesonide-formoterol [facility]) 160-4.5 MCG/ACT inhaler Inhale 2 puffs into the lungs 2 (two) times daily. 04/09/20   Collene Gobble, MD  ?Continuous Blood Gluc Receiver (FREESTYLE LIBRE READER) DEVI 1 kit by Does not apply route daily. Use as instructed E11.65 09/12/19   Shamleffer, Melanie Crazier, MD  ?diphenhydrAMINE (BENADRYL) 25 mg capsule Take 25 mg by mouth every 6 (six) hours as needed for itching.    [provider]  ?ELIQUIS 5 MG TABS tablet Take 1 tablet by mouth twice daily ?Patient taking differently: Take 5 mg by mouth 2 (two) times  daily. 07/23/20   Larey Dresser, MD  ?glucose blood (FREESTYLE LITE) test strip USE AS DIRECTED 4 TIMES DAILY ?Patient taking differently: 1 each by Other route See admin instructions. USE AS DIRECTED 4 TIMES DAILY 02/02/21   Shamleffer, Melanie Crazier, MD  ?insulin aspart (NOVOLOG) 100 UNIT/ML injection CBG 70 - 120: 0 units CBG 121 - 150: 1 unit CBG 151 - 200: 2 units CBG 201 - 250: 3 units CBG 251 - 300: 5 units CBG 301 - 350: 7 units CBG 351 - 400: 9 units ?Patient taking differently: Inject 0-12 Units into the skin See admin instructions. Per sliding scale bid ?70-200 = 0 units ?201-250 = 2 units ?251-300 = 4 units ?301-350 = 6 units ?351-400 = 8 units ?401-450 = 10 units ?451-600 = 12 units 03/05/21   Hosie Poisson, MD  ?insulin degludec (TRESIBA FLEXTOUCH) 100 UNIT/ML FlexTouch Pen Inject 18 Units into the skin daily. ?Patient taking differently: Inject 22 Units into the skin daily. 02/06/20   Shamleffer, Melanie Crazier, MD  ?Insulin Pen Needle 32G X 4 MM MISC 1 Device by Does not apply route in the morning, at noon, in the evening, and at bedtime. 02/06/20   Shamleffer, Melanie Crazier, MD  ?ketoconazole (NIZORAL) 2 % cream Apply 1 application topically daily. 02/16/21   [provider]  ?latanoprost (XALATAN) 0.005 % ophthalmic solution Place 1 drop into both eyes at bedtime.    [provider]  ?lidocaine [facility])  3 % CREA cream Apply 1 application topically every Monday, Wednesday, and Friday. Prior to dialysis 03/06/20   [provider]  ?Lidocaine 4 % PTCH Place 1 patch onto the skin daily. 11/12/20   [provider]  ?Menthol, Topical Analgesic, (BIOFREEZE) 4 % GEL Apply 1 application. topically in the morning, at noon, and at bedtime.    [provider]  ?midodrine (PROAMATINE) 10 MG tablet Take 10 mg by mouth 3 (three) times daily. Hold if SBP>130    [provider]  ?Multiple Vitamin (MULTI-VITAMIN PO) Take 1 tablet by mouth daily.     [provider]  ?multivitamin (RENA-VIT) TABS tablet Take 1 tablet by mouth at bedtime. ?Patient not taking: Reported on 04/25/2021 03/05/21   Hosie Poisson, MD  ?pantoprazole (PROTONIX) 40 MG tablet Take 40 mg by mouth at bedtime. 10/26/18   [provider]  ?Pramoxine-Calamine (AVEENO ANTI-ITCH EX) Apply 1 application topically every 6 (six) hours as needed (itching).    [provider]  ?senna-docusate (SENOKOT-S) 8.6-50 MG tablet Take 1 tablet by mouth at bedtime as needed for mild constipation. ?Patient not taking: Reported on 04/25/2021 03/05/21   Hosie Poisson, MD  ?sucroferric oxyhydroxide (VELPHORO) 500 MG chewable tablet Chew 500 mg by mouth 3 (three) times daily with meals.    [provider]  ?traZODone (DESYREL) 50 MG tablet Take 25 mg by mouth at bedtime.    [provider]  ? ? ? ?Allergies    ?Eggs or egg-derived products, Lisinopril, Penicillins, and Other ? ? ?Review of Systems   ?Review of Systems ?Please see HPI for pertinent positives and negatives ? ?Physical Exam ?BP (!) 124/48   Pulse 70   Temp 98 ?F (36.7 ?C) (Oral)   Resp 14   Ht '5\' 3"'  (1.6 m)   Wt 85.3 kg   SpO2 97%   BMI 33.31 kg/m?  ? ?Physical Exam ?Vitals and nursing note reviewed.  ?Constitutional:   ?   Appearance: Normal appearance.  ?HENT:  ?   Head: Normocephalic and atraumatic.  ?   Nose: Nose normal.  ?   Mouth/Throat:  ?   Mouth: Mucous membranes are moist.  ?Eyes:  ?   Extraocular Movements: Extraocular movements intact.  ?   Conjunctiva/sclera: Conjunctivae normal.  ?Cardiovascular:  ?   Rate and Rhythm: Normal rate.  ?Pulmonary:  ?   Effort: Pulmonary effort is normal.  ?   Breath sounds: Normal breath sounds.  ?Abdominal:  ?   General: Abdomen is flat.  ?   Palpations: Abdomen is soft.  ?   Tenderness: There is no abdominal tenderness.  ?Musculoskeletal:     ?   General: Tenderness (L shoulder and L knee) present. No swelling or deformity. Normal range of motion.  ?   Cervical  back: Neck supple.  ?   Comments: Dialysis access in LUE with palpable thrill  ?Skin: ?   General: Skin is warm and dry.  ?Neurological:  ?   General: No focal deficit present.  ?   Mental Status: She is alert.  ?Psychiatric:     ?   Mood and Affect: Mood normal.  ? ? ?ED Results / Procedures / Treatments   ?EKG ?None ? ?Procedures ?Procedures ? ?Medications Ordered in the ED ?Medications - No data to display ? ?Initial Impression and Plan ? Patient with mechanical fall, including head injury on Eliquis. Will send for CT head as well as Xrays of L shoulder and knee where  she is complaining of pain. She does not have any outward signs of trauma.  ? ?ED Course  ? ?Clinical Course as of 06/07/21 0250  ?Sun Jun 07, 2021  ?0225 I personally viewed the images from radiology studies and agree with radiologist interpretation: Shoulder and knee imaging neg for fracture ? [CS]  ?0242 I personally viewed the images from radiology studies and agree with radiologist interpretation: CT neg for acute injury.  ? [CS]  ?0242 No signs of significant injury from fall. Plan to return to [facility]. RTED for any other concerns.  [CS]  ?0249 Discussed results with patient and daughter at bedside who are in agreement with plan.  [CS]  ?  ?Clinical Course User Index ?[CS] Truddie Hidden, MD  ? ? ? ?MDM Rules/Calculators/A&P ?Medical Decision Making ?Problems Addressed: ?Contusion of left knee, initial encounter: acute illness or injury ?Contusion of left shoulder, initial encounter: acute illness or injury ?Fall, initial encounter: acute illness or injury ?Injury of head, initial encounter: acute illness or injury ? ?Amount and/or Complexity of Data Reviewed ?Radiology: ordered and independent interpretation performed. Decision-making details documented in ED Course. ? ? ? ?Final Clinical Impression(s) / ED Diagnoses ?Final diagnoses:  ?Fall, initial encounter  ?Injury of head, initial encounter  ?Contusion of left shoulder, initial  encounter  ?Contusion of left knee, initial encounter  ? ? ?Rx / DC Orders ?ED Discharge Orders   ? ? None  ? ?  ? ?  ?Truddie Hidden, MD ?06/07/21 [phone] ? ?

## 2021-06-07 NOTE — ED Notes (Signed)
[facility] called  

## 2021-06-08 DIAGNOSIS — I509 Heart failure, unspecified: Secondary | ICD-10-CM | POA: Diagnosis not present

## 2021-06-08 DIAGNOSIS — M6281 Muscle weakness (generalized): Secondary | ICD-10-CM | POA: Diagnosis not present

## 2021-06-08 DIAGNOSIS — R296 Repeated falls: Secondary | ICD-10-CM | POA: Diagnosis not present

## 2021-06-08 DIAGNOSIS — R52 Pain, unspecified: Secondary | ICD-10-CM | POA: Diagnosis not present

## 2021-06-08 DIAGNOSIS — N186 End stage renal disease: Secondary | ICD-10-CM | POA: Diagnosis not present

## 2021-06-08 DIAGNOSIS — I4891 Unspecified atrial fibrillation: Secondary | ICD-10-CM | POA: Diagnosis not present

## 2021-06-08 DIAGNOSIS — L299 Pruritus, unspecified: Secondary | ICD-10-CM | POA: Diagnosis not present

## 2021-06-08 DIAGNOSIS — Z992 Dependence on renal dialysis: Secondary | ICD-10-CM | POA: Diagnosis not present

## 2021-06-08 DIAGNOSIS — E119 Type 2 diabetes mellitus without complications: Secondary | ICD-10-CM | POA: Diagnosis not present

## 2021-06-08 DIAGNOSIS — E669 Obesity, unspecified: Secondary | ICD-10-CM | POA: Diagnosis not present

## 2021-06-08 DIAGNOSIS — D631 Anemia in chronic kidney disease: Secondary | ICD-10-CM | POA: Diagnosis not present

## 2021-06-08 DIAGNOSIS — M5459 Other low back pain: Secondary | ICD-10-CM | POA: Diagnosis not present

## 2021-06-08 DIAGNOSIS — N2581 Secondary hyperparathyroidism of renal origin: Secondary | ICD-10-CM | POA: Diagnosis not present

## 2021-06-08 DIAGNOSIS — M159 Polyosteoarthritis, unspecified: Secondary | ICD-10-CM | POA: Diagnosis not present

## 2021-06-11 DIAGNOSIS — I4891 Unspecified atrial fibrillation: Secondary | ICD-10-CM | POA: Diagnosis not present

## 2021-06-11 DIAGNOSIS — N186 End stage renal disease: Secondary | ICD-10-CM | POA: Diagnosis not present

## 2021-06-11 DIAGNOSIS — E1169 Type 2 diabetes mellitus with other specified complication: Secondary | ICD-10-CM | POA: Diagnosis not present

## 2021-06-11 DIAGNOSIS — Z992 Dependence on renal dialysis: Secondary | ICD-10-CM | POA: Diagnosis not present

## 2021-06-11 DIAGNOSIS — J449 Chronic obstructive pulmonary disease, unspecified: Secondary | ICD-10-CM | POA: Diagnosis not present

## 2021-06-11 DIAGNOSIS — I5042 Chronic combined systolic (congestive) and diastolic (congestive) heart failure: Secondary | ICD-10-CM | POA: Diagnosis not present

## 2021-06-11 DIAGNOSIS — I959 Hypotension, unspecified: Secondary | ICD-10-CM | POA: Diagnosis not present

## 2021-06-15 DIAGNOSIS — N2581 Secondary hyperparathyroidism of renal origin: Secondary | ICD-10-CM | POA: Diagnosis not present

## 2021-06-15 DIAGNOSIS — R52 Pain, unspecified: Secondary | ICD-10-CM | POA: Diagnosis not present

## 2021-06-15 DIAGNOSIS — Z992 Dependence on renal dialysis: Secondary | ICD-10-CM | POA: Diagnosis not present

## 2021-06-15 DIAGNOSIS — D631 Anemia in chronic kidney disease: Secondary | ICD-10-CM | POA: Diagnosis not present

## 2021-06-15 DIAGNOSIS — L299 Pruritus, unspecified: Secondary | ICD-10-CM | POA: Diagnosis not present

## 2021-06-15 DIAGNOSIS — N186 End stage renal disease: Secondary | ICD-10-CM | POA: Diagnosis not present

## 2021-06-16 ENCOUNTER — Ambulatory Visit: Payer: HMO | Admitting: Family Medicine

## 2021-06-17 DIAGNOSIS — N186 End stage renal disease: Secondary | ICD-10-CM | POA: Diagnosis not present

## 2021-06-17 DIAGNOSIS — M1990 Unspecified osteoarthritis, unspecified site: Secondary | ICD-10-CM | POA: Diagnosis not present

## 2021-06-17 DIAGNOSIS — I5043 Acute on chronic combined systolic (congestive) and diastolic (congestive) heart failure: Secondary | ICD-10-CM | POA: Diagnosis not present

## 2021-06-17 DIAGNOSIS — I959 Hypotension, unspecified: Secondary | ICD-10-CM | POA: Diagnosis not present

## 2021-06-17 DIAGNOSIS — E278 Other specified disorders of adrenal gland: Secondary | ICD-10-CM | POA: Diagnosis not present

## 2021-06-17 DIAGNOSIS — I4891 Unspecified atrial fibrillation: Secondary | ICD-10-CM | POA: Diagnosis not present

## 2021-06-17 DIAGNOSIS — E1169 Type 2 diabetes mellitus with other specified complication: Secondary | ICD-10-CM | POA: Diagnosis not present

## 2021-06-17 DIAGNOSIS — J449 Chronic obstructive pulmonary disease, unspecified: Secondary | ICD-10-CM | POA: Diagnosis not present

## 2021-06-17 DIAGNOSIS — N9489 Other specified conditions associated with female genital organs and menstrual cycle: Secondary | ICD-10-CM | POA: Diagnosis not present

## 2021-06-17 DIAGNOSIS — I2510 Atherosclerotic heart disease of native coronary artery without angina pectoris: Secondary | ICD-10-CM | POA: Diagnosis not present

## 2021-06-18 ENCOUNTER — Ambulatory Visit (HOSPITAL_BASED_OUTPATIENT_CLINIC_OR_DEPARTMENT_OTHER)
Admission: RE | Admit: 2021-06-18 | Discharge: 2021-06-18 | Disposition: A | Discharge status: Home / Self Care | Payer: HMO | Source: Ambulatory Visit | Attending: Cardiology | Admitting: Cardiology

## 2021-06-18 ENCOUNTER — Ambulatory Visit (HOSPITAL_COMMUNITY)
Admission: RE | Admit: 2021-06-18 | Discharge: 2021-06-18 | Disposition: A | Discharge status: Home / Self Care | Payer: HMO | Source: Ambulatory Visit | Attending: Cardiology | Admitting: Cardiology

## 2021-06-18 ENCOUNTER — Encounter (HOSPITAL_COMMUNITY): Payer: Self-pay | Admitting: Cardiology

## 2021-06-18 VITALS — BP 140/60 | HR 60 | Wt 187.6 lb

## 2021-06-18 DIAGNOSIS — E1122 Type 2 diabetes mellitus with diabetic chronic kidney disease: Secondary | ICD-10-CM | POA: Insufficient documentation

## 2021-06-18 DIAGNOSIS — I480 Paroxysmal atrial fibrillation: Secondary | ICD-10-CM | POA: Diagnosis not present

## 2021-06-18 DIAGNOSIS — Z7901 Long term (current) use of anticoagulants: Secondary | ICD-10-CM | POA: Diagnosis not present

## 2021-06-18 DIAGNOSIS — Z79899 Other long term (current) drug therapy: Secondary | ICD-10-CM | POA: Diagnosis not present

## 2021-06-18 DIAGNOSIS — I132 Hypertensive heart and chronic kidney disease with heart failure and with stage 5 chronic kidney disease, or end stage renal disease: Secondary | ICD-10-CM | POA: Diagnosis not present

## 2021-06-18 DIAGNOSIS — Z794 Long term (current) use of insulin: Secondary | ICD-10-CM | POA: Insufficient documentation

## 2021-06-18 DIAGNOSIS — I5022 Chronic systolic (congestive) heart failure: Secondary | ICD-10-CM

## 2021-06-18 DIAGNOSIS — Z992 Dependence on renal dialysis: Secondary | ICD-10-CM | POA: Insufficient documentation

## 2021-06-18 DIAGNOSIS — N186 End stage renal disease: Secondary | ICD-10-CM | POA: Diagnosis not present

## 2021-06-18 DIAGNOSIS — I2510 Atherosclerotic heart disease of native coronary artery without angina pectoris: Secondary | ICD-10-CM | POA: Insufficient documentation

## 2021-06-18 DIAGNOSIS — R531 Weakness: Secondary | ICD-10-CM | POA: Insufficient documentation

## 2021-06-18 DIAGNOSIS — I272 Pulmonary hypertension, unspecified: Secondary | ICD-10-CM | POA: Insufficient documentation

## 2021-06-18 DIAGNOSIS — E785 Hyperlipidemia, unspecified: Secondary | ICD-10-CM | POA: Diagnosis not present

## 2021-06-18 DIAGNOSIS — I5042 Chronic combined systolic (congestive) and diastolic (congestive) heart failure: Secondary | ICD-10-CM

## 2021-06-18 DIAGNOSIS — Z515 Encounter for palliative care: Secondary | ICD-10-CM | POA: Diagnosis not present

## 2021-06-18 LAB — COMPREHENSIVE METABOLIC PANEL
ALT: 16 U/L (ref 0–44)
AST: 16 U/L (ref 15–41)
Albumin: 3.1 g/dL — ABNORMAL LOW (ref 3.5–5.0)
Alkaline Phosphatase: 83 U/L (ref 38–126)
Anion gap: 10 (ref 5–15)
BUN: 20 mg/dL (ref 8–23)
CO2: 32 mmol/L (ref 22–32)
Calcium: 8.8 mg/dL — ABNORMAL LOW (ref 8.9–10.3)
Chloride: 97 mmol/L — ABNORMAL LOW (ref 98–111)
Creatinine, Ser: 5.6 mg/dL — ABNORMAL HIGH (ref 0.44–1.00)
GFR, Estimated: 7 mL/min — ABNORMAL LOW (ref >60)
Glucose, Bld: 222 mg/dL — ABNORMAL HIGH (ref 70–99)
Potassium: 4.3 mmol/L (ref 3.5–5.1)
Sodium: 139 mmol/L (ref 135–145)
Total Bilirubin: 0.4 mg/dL (ref 0.3–1.2)
Total Protein: 6.9 g/dL (ref 6.5–8.1)

## 2021-06-18 LAB — TSH: TSH: 1.271 u[IU]/mL (ref 0.350–4.500)

## 2021-06-18 LAB — ECHOCARDIOGRAM COMPLETE
Area-P 1/2: 2.54 cm2
S' Lateral: 3.4 cm

## 2021-06-18 NOTE — Patient Instructions (Signed)
There has been no changes to your medications.  Labs done today, your results will be available in MyChart, we will contact you for abnormal readings.  Your physician recommends that you schedule a follow-up appointment in: 4 months   If you have any questions or concerns before your next appointment please send us a message through mychart or call our office at [phone].    TO LEAVE A MESSAGE FOR THE NURSE SELECT OPTION 2, PLEASE LEAVE A MESSAGE INCLUDING: YOUR NAME DATE OF BIRTH CALL BACK NUMBER REASON FOR CALL**this is important as we prioritize the call backs  YOU WILL RECEIVE A CALL BACK THE SAME DAY AS LONG AS YOU CALL BEFORE 4:00 PM  At the [facility], you and your health needs are our priority. As part of our continuing mission to provide you with exceptional heart care, we have created designated Provider Care Teams. These Care Teams include your primary Cardiologist (physician) and Advanced Practice Providers (APPs- Physician Assistants and Nurse Practitioners) who all work together to provide you with the care you need, when you need it.   You may see any of the following providers on your designated Care Team at your next follow up: Dr Daniel Bensimhon Dr Dalton McLean Amy Clegg, NP Brittainy Simmons, PA Jessica Milford,NP Lindsay Finch, PA Lauren Kemp, PharmD   Please be sure to bring in all your medications bottles to every appointment.    

## 2021-06-18 NOTE — Progress Notes (Signed)
?[facility] Note ? ?PCP: Alroy Dust, L.Marlou Sa, MD ?HF Cardiology: Dr. Aundra Dubin ?Nephrology: Dr Hollie Salk.  ? ?81 y.o.with history of ESRD, paroxysmal atrial fibrillation, and chronic diastolic CHF.  RHC/LHC was done in 10/15.  This showed markedly elevated filling pressures and pulmonary venous hypertension.  There was no significant CAD.  Last echo in 2/18 showed EF 50-55%, mild LVH, grade 2 diastolic dysfunction.  ? ?Admitted 10/2-10/6/19 with SOB. RHC showed elevated filling pressures and preserved CO. She was diuresed with IV lasix, then transitioned to torsemide 80 mg am, 40 mg pm. Repeat echo showed EF 45-50% with normal RV. PYP scan was negative for TTR amyloid. PFTs showed restrictive disease. CT chest showed no ILD but did show right hydronephrosis. Renal US showed single kidney with mild to moderate hydronephrosis on right (unchanged since 2011).  DC weight 208 lbs.   ? ?In 12/20, she progressed to ESRD and was started on HD.   ? ?Echo in 1/21 showed EF 55-60% with normal.  RHC/LHC in 4/21 showed no obstructive CAD, normal filling pressures, mild pulmonary hypertension.  She had atrial fibrillation in 9/21 and underwent DCCV to NSR.  ? ?4/22 was seen in ED for acute dyspnea. Found to be in rapid Afib. This was in setting of recent consumption of high caffeine energy drink. She was given IV Cardizem and converted back to NSR and was released same day. ? ?Admitted 12/22 with atrial fibrillation with RVR. She was cardioverted in ED to SR with PACs. She was started on amio and Toprol. Unfortunately she was found unresponsive, intubated and transferred to ICU on NE. Code stroke activated. MRI brain without acute process. 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  She required CVVH but was able to transition to [facility] after extubation. Echo showed EF 30-35% range, worse function in septum; mild RV dysfunction, IVC dilated. She underwent LHC  showing no obstructive CAD. 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 She went back into afib requiring IV amiodarone and had successful DCCV 03/03/21. ? ?At post hospital visit on 02/28/21, back in A fib. On 03/12/21 she underwent successful cardioversion. Echo was done today and reviewed, EF 55-60%, mild LVH, normal RV.  ? ?Today she returns for HF follow up with her daughter. She is going home from SNF today.  She is weak, still in wheelchair most of the time.  She is doing some walking now with a walker.  No dyspnea with short distances but tires.  No palpitations, she is in NSR today.  She will be starting PACE.  ? ?ECG (personally reviewed): NSR, IVCD 124 msec ? ?Labs (2/18): K 4, creatinine 1.79 ?Labs (10/19): K 4.5, creatinine 2.4 ?Labs (12/19): LDL 62 ?Labs (1/20): K 3.9, creatinine 2.72 ?Labs (12/20): hgb 11.9 ?Labs (9/21): LDL 64, HDL 53 ?Labs (4/21): SCr 4.19, K 3.8  ?Labs (1/23): K 3.6, creatinine 4.89, hgb 10.5 ? ?PMH: ?1. Atrial fibrillation: Paroxysmal.  ?2. HTN ?3. 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  ?5. ESRD ?6. H/o IVCD ?7. COPD: PFTs (10/19) actually showed severe restriction, concern for interstitial process.  CT chest (10/19) showed mild-moderate patchy air trapping but no evidence for interstitial lung disease.  ?8. Gout ?9. Chronic primarily diastolic CHF: Echo (9/98) with EF 50-55%, mild LVH, grade 2 diastolic dysfunction.  ?- LHC/RHC (10/15): small distal LAD but no  discrete stenosis.  Mean RA 15, PA 55/20 mean 34, mean PCWP 31 => pulmonary venous hypertension.  ?- PYP scan (10/19): No evidence for TTR amyloidosis.  ?- Echo (10/19): EF 45-50%, mild LVH, normal RV size and systolic function.  ?- RHC (10/19): mean RA 13, PA 74/27 mean 50, mean PCWP 28, CI 3.12, PVR 3.6 WU. Pulmonary venous hypertension.  ?- Echo (1/21): EF 55-60%,  normal RV.  ?- LHC/RHC (4/21): super-dominant LCx with small LAD and RCA, no CAD; mean RA 5, PA 47/15, mean PCWP 10, CI 3.61, PVR 2.5 WU.  ?- Echo (1/23): EF 30-35% range, worse function in septum; mild RV dysfunction, IVC dilated.  ?- LHC (1/23): no obstructive CAD. 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   ?- Echo (4/23): EF 55-60%, mild LVH, normal RV.  ?10. Negative sleep study in 6/21.  ? ?Review of systems complete and found to be negative unless listed in HPI.   ? ?Social History  ? ?Socioeconomic History  ? Marital status: Widowed  ?  Spouse name: Not on file  ? Number of children: 2  ? Years of education: Not on file  ? Highest education level: Not on file  ?Occupational History  ? Occupation: retired  ?Tobacco Use  ? Smoking status: Former  ?  Packs/day: 1.00  ?  Years: 20.00  ?  Pack years: 20.00  ?  Types: Cigarettes  ?  Quit date: 02/22/2009  ?  Years since quitting: 12.3  ? Smokeless tobacco: Never  ?Vaping Use  ? Vaping Use: Never used  ?Substance and Sexual Activity  ? Alcohol use: Yes  ?  Alcohol/week: 0.0 standard drinks  ?  Comment: occasional beer  ? Drug use: Not Currently  ?  Comment: marijuana in the past  ? Sexual activity: Never  ?Other Topics Concern  ? Not on file  ?Social History Narrative  ? Not on file  ? ?Social Determinants of Health  ? ?Financial Resource Strain: Low Risk   ? Difficulty of Paying Living Expenses: Not very hard  ?Food Insecurity: No Food Insecurity  ? Worried About Charity fundraiser in the Last Year: Never true  ? Ran Out of Food in the Last Year: Never true  ?Transportation Needs: No Transportation Needs  ? Lack of Transportation (Medical): No  ? Lack of Transportation (Non-Medical): No  ?Physical Activity: Not on file  ?Stress: Not on file  ?Social Connections: Not on file  ?Intimate Partner Violence: Not on file  ? ?Family History  ?Problem Relation Age of Onset  ? Lupus Daughter   ? Cancer Mother 40  ?      type unknown  ? CVA Father 44  ? Cancer Brother 43  ?     type unknown  ? ?Review of systems complete and found to be negative unless listed in HPI.   ? ?Current Outpatient Medications  ?Medication Sig Dispense Refill  ? acetaminophen (TYLENOL) 500 MG tablet Take 1,000 mg by mouth daily.    ? albuterol (PROVENTIL HFA;VENTOLIN HFA) 108 (90 BASE) MCG/ACT inhaler Inhale 2 puffs into the lungs in the morning and at bedtime.    ? amiodarone (PACERONE) 200 MG tablet Take 200 mg by mouth daily.    ? atorvastatin (LIPITOR) 80 MG tablet Take 80 mg by mouth at bedtime.    ? Blood Glucose Monitoring Suppl (FREESTYLE LITE) DEVI Use as instructed to check blood sugar 4 times daily  1 kit 0  ? budesonide-formoterol (SYMBICORT) 160-4.5 MCG/ACT inhaler Inhale 2 puffs into the lungs 2 (two) times daily. 1 each 0  ? Continuous Blood Gluc Receiver (FREESTYLE LIBRE READER) DEVI 1 kit by Does not apply route daily. Use as instructed E11.65 1 each 0  ? diphenhydrAMINE (BENADRYL) 25 mg capsule Take 25 mg by mouth every 6 (six) hours as needed for itching.    ? ELIQUIS 5 MG TABS tablet Take 1 tablet by mouth twice daily 180 tablet 3  ? glucose blood (FREESTYLE LITE) test strip USE AS DIRECTED 4 TIMES DAILY 100 each 0  ? insulin aspart (NOVOLOG) 100 UNIT/ML injection CBG 70 - 120: 0 units CBG 121 - 150: 1 unit CBG 151 - 200: 2 units CBG 201 - 250: 3 units CBG 251 - 300: 5 units CBG 301 - 350: 7 units CBG 351 - 400: 9 units (Patient taking differently: Inject 0-12 Units into the skin See admin instructions. Per sliding scale bid ?70-200 = 0 units ?201-250 = 2 units ?251-300 = 4 units ?301-350 = 6 units ?351-400 = 8 units ?401-450 = 10 units ?451-600 = 12 units) 10 mL 11  ? Insulin Degludec (TRESIBA [place]) Inject 20 Units into the skin daily.    ? Insulin Pen Needle 32G X 4 MM MISC 1 Device by Does not apply route in the morning, at noon, in the evening, and at bedtime. 400 each 3  ? ketoconazole (NIZORAL) 2 % cream Apply 1 application  topically daily.    ? latanoprost (XALATAN) 0.005 % ophthalmic solution Place 1 drop into both eyes at bedtime.    ? lidocaine (LINDAMANTLE) 3 % CREA cream Apply 1 application topically every Monday, Wednesday

## 2021-06-21 DIAGNOSIS — N186 End stage renal disease: Secondary | ICD-10-CM | POA: Diagnosis not present

## 2021-06-21 DIAGNOSIS — I129 Hypertensive chronic kidney disease with stage 1 through stage 4 chronic kidney disease, or unspecified chronic kidney disease: Secondary | ICD-10-CM | POA: Diagnosis not present

## 2021-06-21 DIAGNOSIS — Z992 Dependence on renal dialysis: Secondary | ICD-10-CM | POA: Diagnosis not present

## 2021-06-23 ENCOUNTER — Ambulatory Visit: Payer: HMO | Admitting: Family Medicine

## 2021-07-02 ENCOUNTER — Other Ambulatory Visit: Payer: Self-pay | Admitting: Family Medicine

## 2021-07-02 ENCOUNTER — Other Ambulatory Visit (HOSPITAL_COMMUNITY): Payer: Self-pay | Admitting: Family Medicine

## 2021-07-02 DIAGNOSIS — Z0001 Encounter for general adult medical examination with abnormal findings: Secondary | ICD-10-CM

## 2021-07-08 ENCOUNTER — Ambulatory Visit: Payer: HMO | Admitting: Neurology

## 2021-07-09 ENCOUNTER — Encounter: Payer: Self-pay | Admitting: Neurology

## 2021-07-09 ENCOUNTER — Ambulatory Visit (INDEPENDENT_AMBULATORY_CARE_PROVIDER_SITE_OTHER): Payer: Medicare (Managed Care) | Admitting: Neurology

## 2021-07-09 ENCOUNTER — Ambulatory Visit: Payer: HMO | Admitting: Neurology

## 2021-07-09 VITALS — BP 150/68 | HR 70 | Ht 63.0 in | Wt 187.0 lb

## 2021-07-09 DIAGNOSIS — Z9289 Personal history of other medical treatment: Secondary | ICD-10-CM

## 2021-07-09 DIAGNOSIS — G3184 Mild cognitive impairment, so stated: Secondary | ICD-10-CM | POA: Diagnosis not present

## 2021-07-09 NOTE — Patient Instructions (Addendum)
Continue current medication Continue physical therapy outpatient therapy and encouraged her to get speech therapy Follow-up with your primary care doctor Follow-up with 63-month, we will repeat the [place]

## 2021-07-09 NOTE — Progress Notes (Signed)
[facility]  PATIENT: Stephanie Woodward DOB: February 22, 1941  REQUESTING CLINICIAN: Earmon Phoenix, NP HISTORY FROM: Patient and daughter  REASON FOR VISIT: Memory deficit    HISTORICAL  CHIEF COMPLAINT:  Chief Complaint  Patient presents with   New Patient (Initial Visit)    Room 12, with daughter sharon  NP/Paper/[facility]/Lee Wynetta Emery NP [phone] eval/ Pt lives with daughter Stephanie Woodward , pt was in hospital on ventilator in 01/2021, daughter states memory has not improved since then  Moca 18    HISTORY OF PRESENT ILLNESS:  This is a 81 year old woman with past medical history of atrial fibrillation, on Eliquis, CHF, hypertension, hyperlipidemia, end-stage renal disease on dialysis Monday Wednesday Friday who is presenting for memory problem. Patient reports memory problems started after her hospitalization in December.  She went to the hospital in December for A-fib, hospitalization was complicated by respiratory distress requiring ventilation, admission to ICU and rehab for about 3 to 4 months.  While in rehab she was on tramadol for pain and was confused, having hallucination and these symptoms subsided once the tramadol was discontinued.  While in Rehab also, she got infected with COVID.   Since April 22 she has been living with her daughter and since then family has reported some improvement in the memory but is still not back to baseline.  She still getting confused going to familiar places, and her main problem is forgetting what she was doing.  She gives the example of she can be in the kitchen, then decided to go to the refrigerator and when she opens the refrigerator she does not know why she opened her refrigerator and she will go back to the kitchen and forget what she was doing there. Seems like there is a problem with attention.    She still working with [facility] 2 days a week, getting physical therapy and Occupational Therapy and family is also  trying to get speech.  She has a primary care doctor has a Education officer, museum and still getting dialysis 3 days a week.  While in rehab she had falls, CT has been negative for any intracranial abnormality.  Since being home no report of falls.    Her main goal is to go back to her independent living facility.    TBI:   No past history of TBI but had recent falls  Stroke:   no past history of stroke Seizures:   no past history of seizures Sleep:   no history of sleep apnea.   Mood:   patient denies anxiety and depression but reports low mood  Functional status: Dependent in all some ADLs Patient lives with daughter   Cooking: no  Cleaning: sometimes Shopping: no Bathing: needs help Toileting: needs help Driving: no Bills: no  Ever left the stove on by accident?: currently not cooking Forget how to use items around the house?: no Getting lost going to familiar places?: yes Forgetting loved ones names?: no Word finding difficulty? no Sleep: ok   OTHER MEDICAL CONDITIONS: Atrial fibrillation, CHF, Hypertension, hyperlipidemia, DMII, ESRD on Dialysis MWF, back pain, glaucoma   REVIEW OF SYSTEMS: Full 14 system review of systems performed and negative with exception of: as noted in the HPI  ALLERGIES: Allergies  Allergen Reactions   Eggs Or Egg-Derived Products Nausea And Vomiting   Lisinopril Nausea And Vomiting   Penicillins Nausea And Vomiting    Has patient had a PCN reaction causing immediate rash, facial/tongue/throat swelling, SOB or lightheadedness with hypotension: No Has  patient had a PCN reaction causing severe rash involving mucus membranes or skin necrosis: No Has patient had a PCN reaction that required hospitalization: No Has patient had a PCN reaction occurring within the last 10 years: No If all of the above answers are "NO", then may proceed with Cephalosporin use.    Other Other (See Comments)    HOME MEDICATIONS: Outpatient Medications Prior to Visit   Medication Sig Dispense Refill   acetaminophen (TYLENOL) 500 MG tablet Take 1,000 mg by mouth daily.     albuterol (PROVENTIL HFA;VENTOLIN HFA) 108 (90 BASE) MCG/ACT inhaler Inhale 2 puffs into the lungs in the morning and at bedtime.     amiodarone (PACERONE) 200 MG tablet Take 200 mg by mouth daily.     atorvastatin (LIPITOR) 80 MG tablet Take 80 mg by mouth at bedtime.     Blood Glucose Monitoring Suppl (FREESTYLE LITE) DEVI Use as instructed to check blood sugar 4 times daily 1 kit 0   budesonide-formoterol (SYMBICORT) 160-4.5 MCG/ACT inhaler Inhale 2 puffs into the lungs 2 (two) times daily. 1 each 0   Continuous Blood Gluc Receiver (FREESTYLE LIBRE READER) DEVI 1 kit by Does not apply route daily. Use as instructed E11.65 1 each 0   diphenhydrAMINE (BENADRYL) 25 mg capsule Take 25 mg by mouth every 6 (six) hours as needed for itching.     ELIQUIS 5 MG TABS tablet Take 1 tablet by mouth twice daily 180 tablet 3   glucose blood (FREESTYLE LITE) test strip USE AS DIRECTED 4 TIMES DAILY 100 each 0   Insulin Degludec (TRESIBA [place]) Inject 20 Units into the skin daily.     Insulin Pen Needle 32G X 4 MM MISC 1 Device by Does not apply route in the morning, at noon, in the evening, and at bedtime. 400 each 3   ketoconazole (NIZORAL) 2 % cream Apply 1 application topically daily.     latanoprost (XALATAN) 0.005 % ophthalmic solution Place 1 drop into both eyes at bedtime.     lidocaine (LINDAMANTLE) 3 % CREA cream Apply 1 application topically every Monday, Wednesday, and Friday. Prior to dialysis     Lidocaine 4 % PTCH Place 1 patch onto the skin daily.     Menthol, Topical Analgesic, (BIOFREEZE) 4 % GEL Apply 1 application. topically in the morning, at noon, and at bedtime.     midodrine (PROAMATINE) 10 MG tablet Take 10 mg by mouth 3 (three) times daily. Hold if SBP>130     Multiple Vitamin (MULTI-VITAMIN PO) Take 1 tablet by mouth daily.     multivitamin (RENA-VIT) TABS tablet Take 1 tablet by  mouth at bedtime.  0   pantoprazole (PROTONIX) 40 MG tablet Take 40 mg by mouth at bedtime.     Pramoxine-Calamine (AVEENO ANTI-ITCH EX) Apply 1 application topically every 6 (six) hours as needed (itching).     sucroferric oxyhydroxide (VELPHORO) 500 MG chewable tablet Chew 500 mg by mouth 3 (three) times daily with meals.     traZODone (DESYREL) 50 MG tablet Take 25 mg by mouth at bedtime.     insulin aspart (NOVOLOG) 100 UNIT/ML injection CBG 70 - 120: 0 units CBG 121 - 150: 1 unit CBG 151 - 200: 2 units CBG 201 - 250: 3 units CBG 251 - 300: 5 units CBG 301 - 350: 7 units CBG 351 - 400: 9 units (Patient taking differently: Inject 0-12 Units into the skin See admin instructions. Per sliding scale bid 70-200 = 0  units 201-250 = 2 units 251-300 = 4 units 301-350 = 6 units 351-400 = 8 units 401-450 = 10 units 451-600 = 12 units) 10 mL 11   No facility-administered medications prior to visit.    PAST MEDICAL HISTORY: Past Medical History:  Diagnosis Date   Arthritis    Asthma    Atrial fibrillation (HCC)    CHF (congestive heart failure) (HCC)    CKD (chronic kidney disease), stage III (HCC)    COPD (chronic obstructive pulmonary disease) (HCC)    Diabetes mellitus    INSULIN DEPENDENT   Gout    Heart murmur    no issues per pt   History of kidney stones    Hyperlipemia    Hypertension    Psoriasis    Renal disorder    congenital   Single kidney    Sleep apnea    doesn't use the Cpap    PAST SURGICAL HISTORY: Past Surgical History:  Procedure Laterality Date   A/V FISTULAGRAM Left 10/31/2020   Procedure: A/V FISTULAGRAM;  Surgeon: Angelia Mould, MD;  Location: [facility];  Service: Cardiovascular;  Laterality: Left;   ABDOMINAL HYSTERECTOMY     AV FISTULA PLACEMENT Left 11/17/2018   Procedure: BRACHIO-CEPHALIC ARTERIOVENOUS (AV) FISTULA CREATION IN LEFT ARM;  Surgeon: Marty Heck, MD;  Location: [facility];  Service: Vascular;  Laterality: Left;    CARDIOVERSION N/A 03/03/2021   Procedure: CARDIOVERSION;  Surgeon: Larey Dresser, MD;  Location: [facility] ENDOSCOPY;  Service: Cardiovascular;  Laterality: N/A;   CARDIOVERSION N/A 03/12/2021   Procedure: CARDIOVERSION;  Surgeon: Larey Dresser, MD;  Location[facility] ENDOSCOPY;  Service: Cardiovascular;  Laterality: N/A;   CHOLECYSTECTOMY     INSERTION OF DIALYSIS CATHETER Right 01/26/2019   Procedure: INSERTION OF DIALYSIS CATHETER, right internal jugular;  Surgeon: Angelia Mould, MD;  Location: [facility];  Service: Vascular;  Laterality: Right;   LEFT AND RIGHT HEART CATHETERIZATION WITH CORONARY ANGIOGRAM N/A 11/29/2013   Procedure: LEFT AND RIGHT HEART CATHETERIZATION WITH CORONARY ANGIOGRAM;  Surgeon: Jacolyn Reedy, MD;  Location: [facility] CATH LAB;  Service: Cardiovascular;  Laterality: N/A;   LEFT HEART CATH AND CORONARY ANGIOGRAPHY N/A 02/24/2021   Procedure: LEFT HEART CATH AND CORONARY ANGIOGRAPHY;  Surgeon: Larey Dresser, MD;  Location: [facility];  Service: Cardiovascular;  Laterality: N/A;   PERIPHERAL VASCULAR INTERVENTION Left 10/31/2020   Procedure: PERIPHERAL VASCULAR INTERVENTION;  Surgeon: Angelia Mould, MD;  Location: [facility];  Service: Cardiovascular;  Laterality: Left;   RIGHT HEART CATH N/A 11/23/2017   Procedure: RIGHT HEART CATH;  Surgeon: Larey Dresser, MD;  Location: [facility];  Service: Cardiovascular;  Laterality: N/A;   RIGHT/LEFT HEART CATH AND CORONARY ANGIOGRAPHY N/A 06/15/2019   Procedure: RIGHT/LEFT HEART CATH AND CORONARY ANGIOGRAPHY;  Surgeon: Larey Dresser, MD;  Location: [facility];  Service: Cardiovascular;  Laterality: N/A;    FAMILY HISTORY: Family History  Problem Relation Age of Onset   Lupus Daughter    Cancer Mother 68       type unknown   CVA Father 45   Cancer Brother 42       type unknown    SOCIAL HISTORY: Social History   Socioeconomic History   Marital status: Widowed    Spouse name: Not on  file   Number of children: 2   Years of education: Not on file   Highest education level: Not on file  Occupational History   Occupation: retired  Tobacco Use   Smoking status: Former    Packs/day: 1.00    Years: 20.00    Pack years: 20.00    Types: Cigarettes    Quit date: 02/22/2009    Years since quitting: 12.3   Smokeless tobacco: Never  Vaping Use   Vaping Use: Never used  Substance and Sexual Activity   Alcohol use: Yes    Alcohol/week: 0.0 standard drinks    Comment: occasional beer   Drug use: Not Currently    Comment: marijuana in the past   Sexual activity: Never  Other Topics Concern   Not on file  Social History Narrative   Not on file   Social Determinants of Health   Financial Resource Strain: Low Risk    Difficulty of Paying Living Expenses: Not very hard  Food Insecurity: No Food Insecurity   Worried About Charity fundraiser in the Last Year: Never true   Ran Out of Food in the Last Year: Never true  Transportation Needs: No Transportation Needs   Lack of Transportation (Medical): No   Lack of Transportation (Non-Medical): No  Physical Activity: Not on file  Stress: Not on file  Social Connections: Not on file  Intimate Partner Violence: Not on file    PHYSICAL EXAM  GENERAL EXAM/CONSTITUTIONAL: Vitals:  Vitals:   07/09/21 1400  BP: (!) 150/68  Pulse: 70  Weight: 187 lb (84.8 kg)  Height: $Remove'5\' 3"'ufvQKun$  (1.6 m)   Body mass index is 33.13 kg/m. Wt Readings from Last 3 Encounters:  07/09/21 187 lb (84.8 kg)  06/18/21 187 lb 9.6 oz (85.1 kg)  06/07/21 188 lb 0.8 oz (85.3 kg)   Patient is in no distress; well developed, nourished and groomed; neck is supple  EYES: Pupils round and reactive to light, Visual fields full to confrontation, Extraocular movements intacts,   MUSCULOSKELETAL: Gait, strength, tone, movements noted in Neurologic exam below  NEUROLOGIC: MENTAL STATUS:      View : No data to display.            07/09/2021    2:10  PM  Montreal Cognitive Assessment   Visuospatial/ Executive (0/5) 3  Naming (0/3) 1  Attention: Read list of digits (0/2) 2  Attention: Read list of letters (0/1) 1  Attention: Serial 7 subtraction starting at 100 (0/3) 1  Language: Repeat phrase (0/2) 1  Language : Fluency (0/1) 1  Abstraction (0/2) 2  Delayed Recall (0/5) 0  Orientation (0/6) 6  Total 18     CRANIAL NERVE:  2nd, 3rd, 4th, 6th - pupils equal and reactive to light, visual fields full to confrontation, extraocular muscles intact, no nystagmus 5th - facial sensation symmetric 7th - facial strength symmetric 8th - hearing intact 9th - palate elevates symmetrically, uvula midline 11th - shoulder shrug symmetric 12th - tongue protrusion midline  MOTOR:  normal bulk and tone, at least antigravity   SENSORY:  normal and symmetric to light touch  COORDINATION:  finger-nose-finger  GAIT/STATION:  Deferred    DIAGNOSTIC DATA (LABS, IMAGING, TESTING) - I reviewed patient records, labs, notes, testing and imaging myself where available.  Lab Results  Component Value Date   WBC 12.1 (H) 05/11/2021   HGB 12.1 05/11/2021   HCT 38.3 05/11/2021   MCV 97.2 05/11/2021   PLT 265 05/11/2021      Component Value Date/Time   NA 139 06/18/2021 1133   K 4.3 06/18/2021 1133   CL  97 (L) 06/18/2021 1133   CO2 32 06/18/2021 1133   GLUCOSE 222 (H) 06/18/2021 1133   BUN 20 06/18/2021 1133   CREATININE 5.60 (H) 06/18/2021 1133   CALCIUM 8.8 (L) 06/18/2021 1133   PROT 6.9 06/18/2021 1133   ALBUMIN 3.1 (L) 06/18/2021 1133   AST 16 06/18/2021 1133   ALT 16 06/18/2021 1133   ALKPHOS 83 06/18/2021 1133   BILITOT 0.4 06/18/2021 1133   GFRNONAA 7 (L) 06/18/2021 1133   GFRAA 9 (L) 10/24/2019 1607   Lab Results  Component Value Date   CHOL 109 05/02/2021   HDL 44 05/02/2021   LDLCALC 52 05/02/2021   TRIG 63 05/02/2021   CHOLHDL 2.5 05/02/2021   Lab Results  Component Value Date   HGBA1C 9.1 (H) 02/18/2021   No  results found for: [id number] Lab Results  Component Value Date   TSH 1.271 06/18/2021    Head CT 06/07/21 1. No acute intracranial hemorrhage. 2. Atrophy with chronic microvascular ischemic changes.     ASSESSMENT AND PLAN  81 y.o. year old female with atrial fibrillation, on Eliquis, CHF, hypertension, hyperlipidemia, end-stage renal disease on dialysis Monday Wednesday Friday who is presenting for memory problem after a long hospitalization.  Patient went to the hospital for atrial fibrillation in December 27, hospitalization was complicated by respiratory failure requiring ventilator support, ICU stay, and long rehab course and she was finally discharged home on April 22.  She complaints of memory problem described as being, attention deficit, easily distracted, and forgetful, she will go to the refrigerator but will forget why she went to the refrigerator at the first place.  On top of that her MoCA is 18 out of 30 but family is reporting improvement of her symptoms.  She is getting physical and Occupational Therapy, continue with the medications and dialysis.  I did inform patient and family that she does have mild cognitive impairment but this is not related to dementia or early dementia.  I do believe that the cognitive impairment is related to patient recent long hospitalization (also contracted COVID while in rehab) and with time her memory will improve.  She does report while in rehab, she was isolated and may have depressive symptoms.  One additional recommendation is that if her symptoms do not improve in the next 6 weeks to follow-up with PCP and consider starting antidepressant.  I will see her in 6 months for follow-up and at that time we will repeat the Moca and reassess her mood and depressive symptoms but I do expect her to improve overall.   1. Mild cognitive impairment   2. History of recent hospitalization      Patient Instructions  Continue current  medication Continue physical therapy outpatient therapy and encouraged her to get speech therapy Follow-up with your primary care doctor Follow-up with 53-months, we will repeat the Moca  No orders of the defined types were placed in this encounter.   No orders of the defined types were placed in this encounter.   Return in about 6 months (around 01/09/2022).  I have spent a total of 65 minutes dedicated to this patient today, preparing to see patient, performing a medically appropriate examination and evaluation, ordering tests and/or medications and procedures, and counseling and educating the patient/family/caregiver; independently interpreting result and communicating results to the family/patient/caregiver; and documenting clinical information in the electronic medical record.   Alric Ran, MD 07/09/2021, 5:29 PM  [facility] [address], [place], [place]  [address] [phone]

## 2021-07-13 ENCOUNTER — Other Ambulatory Visit: Payer: Self-pay

## 2021-07-13 ENCOUNTER — Ambulatory Visit
Admission: EM | Admit: 2021-07-13 | Discharge: 2021-07-13 | Disposition: A | Discharge status: Home / Self Care | Payer: Medicare (Managed Care) | Attending: Family Medicine | Admitting: Family Medicine

## 2021-07-13 ENCOUNTER — Ambulatory Visit (INDEPENDENT_AMBULATORY_CARE_PROVIDER_SITE_OTHER): Payer: Medicare (Managed Care)

## 2021-07-13 ENCOUNTER — Encounter (HOSPITAL_BASED_OUTPATIENT_CLINIC_OR_DEPARTMENT_OTHER): Payer: Self-pay

## 2021-07-13 ENCOUNTER — Emergency Department (HOSPITAL_BASED_OUTPATIENT_CLINIC_OR_DEPARTMENT_OTHER): Payer: Medicare (Managed Care)

## 2021-07-13 ENCOUNTER — Ambulatory Visit: Payer: Medicare (Managed Care)

## 2021-07-13 ENCOUNTER — Emergency Department (HOSPITAL_BASED_OUTPATIENT_CLINIC_OR_DEPARTMENT_OTHER)
Admission: EM | Admit: 2021-07-13 | Discharge: 2021-07-13 | Disposition: A | Discharge status: Home / Self Care | Payer: Medicare (Managed Care) | Attending: Emergency Medicine | Admitting: Emergency Medicine

## 2021-07-13 DIAGNOSIS — S0990XA Unspecified injury of head, initial encounter: Secondary | ICD-10-CM

## 2021-07-13 DIAGNOSIS — S52501A Unspecified fracture of the lower end of right radius, initial encounter for closed fracture: Secondary | ICD-10-CM

## 2021-07-13 DIAGNOSIS — M25531 Pain in right wrist: Secondary | ICD-10-CM | POA: Diagnosis not present

## 2021-07-13 DIAGNOSIS — Z794 Long term (current) use of insulin: Secondary | ICD-10-CM | POA: Insufficient documentation

## 2021-07-13 DIAGNOSIS — Z7901 Long term (current) use of anticoagulants: Secondary | ICD-10-CM

## 2021-07-13 DIAGNOSIS — W19XXXA Unspecified fall, initial encounter: Secondary | ICD-10-CM

## 2021-07-13 DIAGNOSIS — S6291XA Unspecified fracture of right wrist and hand, initial encounter for closed fracture: Secondary | ICD-10-CM | POA: Diagnosis not present

## 2021-07-13 DIAGNOSIS — Z79899 Other long term (current) drug therapy: Secondary | ICD-10-CM | POA: Diagnosis not present

## 2021-07-13 DIAGNOSIS — S62101A Fracture of unspecified carpal bone, right wrist, initial encounter for closed fracture: Secondary | ICD-10-CM

## 2021-07-13 DIAGNOSIS — S6991XA Unspecified injury of right wrist, hand and finger(s), initial encounter: Secondary | ICD-10-CM | POA: Diagnosis present

## 2021-07-13 MED ORDER — ACETAMINOPHEN 500 MG PO TABS
1000.0000 mg | ORAL_TABLET | Freq: Once | ORAL | Status: AC
  Administered 2021-07-13: 1000 mg via ORAL
  Filled 2021-07-13: qty 2

## 2021-07-13 NOTE — ED Notes (Signed)
Patient is being discharged from the Urgent Care and sent to the Emergency Department via sisters car . Per lane pa, patient is in need of higher level of care due to fall on eliquis . Patient is aware and verbalizes understanding of plan of care.  Vitals:   07/13/21 1901  BP: (!) 149/65  Pulse: 74  Resp: 18  Temp: 99.3 F (37.4 C)  SpO2: 93%

## 2021-07-13 NOTE — Discharge Instructions (Addendum)
Go directly to the emergency department for further evaluation.  They will be able to place a more long-term splint on your right wrist in the setting.

## 2021-07-13 NOTE — ED Triage Notes (Signed)
Patient presents to Urgent Care with complaints of fall today around 5: 30 PM, pt st she was arriving home from dyalsis when she  missed a step when walking into home and fell on right side. Pt reports pain in right wrist and minor ha. Pt cannot remember if she hit head. Pt took 1 gram tylenol. Pt reports neg loc

## 2021-07-13 NOTE — ED Provider Notes (Signed)
[facility] EMERGENCY DEPT Provider Note   CSN: [id number] Arrival date & time: 07/13/21  2028     History  Chief Complaint  Patient presents with   Lytle Michaels    Stephanie Woodward is a 81 y.o. female presenting emergency department with a fall that occurred today, on an outstretched right wrist and struck her head.  She is on Eliquis.  She went to urgent care where she was diagnosed with an intra-articular mildly displaced comminuted right wrist fracture.  She advised to come to the ED for CT scan of the head.  She has mild to moderate pain in her wrist but not too bad when she is holding still.  She is here with her daughter at bedside reports that the patient is currently at home with her sister, having left rehab a month ago, and a patient of [facility].   HPI     Home Medications Prior to Admission medications   Medication Sig Start Date End Date Taking? Authorizing Provider  acetaminophen (TYLENOL) 500 MG tablet Take 1,000 mg by mouth daily.    [provider]  albuterol (PROVENTIL HFA;VENTOLIN HFA) 108 (90 BASE) MCG/ACT inhaler Inhale 2 puffs into the lungs in the morning and at bedtime.    [provider]  amiodarone (PACERONE) 200 MG tablet Take 200 mg by mouth daily.    [provider]  atorvastatin (LIPITOR) 80 MG tablet Take 80 mg by mouth at bedtime.    [provider]  Blood Glucose Monitoring Suppl (FREESTYLE LITE) DEVI Use as instructed to check blood sugar 4 times daily 12/29/20   Shamleffer, Melanie Crazier, MD  budesonide-formoterol (SYMBICORT) 160-4.5 MCG/ACT inhaler Inhale 2 puffs into the lungs 2 (two) times daily. 04/09/20   Collene Gobble, MD  Continuous Blood Gluc Receiver (FREESTYLE LIBRE READER) DEVI 1 kit by Does not apply route daily. Use as instructed E11.65 09/12/19   Shamleffer, Melanie Crazier, MD  diphenhydrAMINE (BENADRYL) 25 mg capsule Take 25 mg by mouth every 6 (six) hours as needed for itching.     [provider]  ELIQUIS 5 MG TABS tablet Take 1 tablet by mouth twice daily 07/23/20   Larey Dresser, MD  glucose blood (FREESTYLE LITE) test strip USE AS DIRECTED 4 TIMES DAILY 02/02/21   Shamleffer, Melanie Crazier, MD  Insulin Degludec (TRESIBA [place]) Inject 20 Units into the skin daily.    [provider]  Insulin Pen Needle 32G X 4 MM MISC 1 Device by Does not apply route in the morning, at noon, in the evening, and at bedtime. 02/06/20   Shamleffer, Melanie Crazier, MD  ketoconazole (NIZORAL) 2 % cream Apply 1 application topically daily. 02/16/21   [provider]  latanoprost (XALATAN) 0.005 % ophthalmic solution Place 1 drop into both eyes at bedtime.    [provider]  lidocaine (LINDAMANTLE) 3 % CREA cream Apply 1 application topically every Monday, Wednesday, and Friday. Prior to dialysis 03/06/20   [provider]  Lidocaine 4 % PTCH Place 1 patch onto the skin daily. 11/12/20   [provider]  Menthol, Topical Analgesic, (BIOFREEZE) 4 % GEL Apply 1 application. topically in the morning, at noon, and at bedtime.    [provider]  midodrine (PROAMATINE) 10 MG tablet Take 10 mg by mouth 3 (three) times daily. Hold if SBP>130    [provider]  Multiple Vitamin (MULTI-VITAMIN PO) Take 1 tablet by mouth daily.    [provider]  multivitamin (RENA-VIT) TABS tablet Take 1 tablet by mouth at bedtime. 03/05/21   Hosie Poisson, MD  pantoprazole (PROTONIX) 40 MG tablet Take 40 mg by mouth at bedtime. 10/26/18   [provider]  Pramoxine-Calamine (AVEENO ANTI-ITCH EX) Apply 1 application topically every 6 (six) hours as needed (itching).    [provider]  sucroferric oxyhydroxide (VELPHORO) 500 MG chewable tablet Chew 500 mg by mouth 3 (three) times daily with meals.    [provider]  traZODone (DESYREL) 50 MG tablet Take 25 mg by mouth at bedtime.    [provider]       Allergies    Eggs or egg-derived products, Lisinopril, Penicillins, and Other    Review of Systems   Review of Systems  Physical Exam Updated Vital Signs BP (!) 145/103 (BP Location: Left Leg)   Pulse 71   Temp 97.8 F (36.6 C)   Resp 17   Ht 5' 3" (1.6 m)   Wt 84.8 kg   SpO2 94%   BMI 33.12 kg/m  Physical Exam Constitutional:      General: She is not in acute distress. HENT:     Head: Normocephalic and atraumatic.  Eyes:     Conjunctiva/sclera: Conjunctivae normal.     Pupils: Pupils are equal, round, and reactive to light.  Cardiovascular:     Rate and Rhythm: Normal rate and regular rhythm.  Pulmonary:     Effort: Pulmonary effort is normal. No respiratory distress.  Musculoskeletal:     Comments: Right wrist pain and swelling  Skin:    General: Skin is warm and dry.  Neurological:     General: No focal deficit present.     Mental Status: She is alert and oriented to person, place, and time. Mental status is at baseline.     Sensory: No sensory deficit.  Psychiatric:        Mood and Affect: Mood normal.        Behavior: Behavior normal.    ED Results / Procedures / Treatments   Labs (all labs ordered are listed, but only abnormal results are displayed) Labs Reviewed - No data to display  EKG None  Radiology DG Wrist Complete Right  Result Date: 07/13/2021 CLINICAL DATA:  Fall with wrist pain EXAM: RIGHT WRIST - COMPLETE 3+ VIEW COMPARISON:  None Available. FINDINGS: Acute impacted and mildly comminuted intra-articular distal radius fracture with mild dorsal displacement and angulation. Advanced arthritis at the first [facility] joint and STT interval. Positive for soft tissue swelling IMPRESSION: Acute mildly comminuted, displaced and impacted intra-articular distal radius fracture Electronically Signed   By: Donavan Foil M.D.   On: 07/13/2021 19:35   CT Head Wo Contrast  Result Date: 07/13/2021 CLINICAL DATA:  Moderate to severe head trauma after a fall.  Blood thinners. EXAM: CT HEAD WITHOUT CONTRAST TECHNIQUE: Contiguous axial images were obtained from the base of the skull through the vertex without intravenous contrast. RADIATION DOSE REDUCTION: This exam was performed according to the departmental dose-optimization program which includes automated exposure control, adjustment of the mA and/or kV according to patient size and/or use of iterative reconstruction technique. COMPARISON:  06/07/2021 FINDINGS: Brain: Mild cerebral atrophy. Mild ventricular dilatation consistent with central atrophy. Low-attenuation changes in the deep white matter likely due to small vessel ischemic change. No mass effect or midline shift. No abnormal extra-axial fluid collections. The gray-white matter junctions are distinct. The basal cisterns are not effaced. No acute intracranial hemorrhage. Vascular: Prominent vascular calcifications.  Skull: Calvarium appears intact. Sinuses/Orbits: Paranasal sinuses and mastoid air cells are clear. Other: None. IMPRESSION: No acute intracranial abnormalities. Chronic atrophy and small vessel ischemic changes. Electronically Signed   By: Lucienne Capers M.D.   On: 07/13/2021 21:22    Procedures Procedures    Medications Ordered in ED Medications  acetaminophen (TYLENOL) tablet 1,000 mg (has no administration in time range)    ED Course/ Medical Decision Making/ A&P                           Medical Decision Making Amount and/or Complexity of Data Reviewed Radiology: ordered.  Risk OTC drugs.   Patient is here with mechanical fall and injury to the head and wrist.  CT scan of the head ordered and personally reviewed and interpreted, showing no acute intracranial bleed.  I reviewed the x-ray of the wrist that was obtained at urgent care today and agree with intra-articular distal radial fracture.  She is neurovascularly intact.  She will be placed in a sugar-tong splint and given hand surgeon follow-up.  I am recommending a  case management consult to see if the patient may be eligible for additional home health resources.  She has a health aide that comes out 2 hours a day but now will be needing more assistance, given that she has lost functional use of her right hand due to the fracture, she is a higher fall risk, as she typically would use a walker for balance.  She does have a wheelchair at home and uses this predominantly to get around.  Her family feels she will be able to manage for the next few days but would appreciate additional help.  I think this is reasonable.        Final Clinical Impression(s) / ED Diagnoses Final diagnoses:  Fall, initial encounter  Closed fracture of right wrist, initial encounter    Rx / DC Orders ED Discharge Orders     None         Tivis Wherry, Carola Rhine, MD 07/13/21 2144

## 2021-07-13 NOTE — ED Triage Notes (Signed)
Patient here POV from UC.  Endorses Falling today after her Dialysis Treatment. Walker slid while she was attempting to raise from Wheelchair causing her to Fall.   Head Injury to Floor. Patient takes Eliquis.  Was at [facility] and diagnosed with Right Wrist Fracture. Sent for Imaging of Head and for Splint Placement (Currently in Brace).   NAD Noted during Triage. A&Ox4. GCS 15. BIB Wheelchair.

## 2021-07-13 NOTE — ED Provider Notes (Signed)
[facility]    CSN: [id number] Arrival date & time: 07/13/21  1821      History   Chief Complaint Chief Complaint  Patient presents with   Fall    HPI Stephanie Woodward is a 81 y.o. female.   Presenting today for evaluation of right wrist and head pain after a fall around 5:30 PM this evening.  She states she fell on her stairs at home and landed on her right wrist and hit her head on the concrete.  She denies known loss of consciousness, visual change, mental status change, nausea, vomiting, dizziness.  Minimal range of motion in the wrist and pain leading up to right thumb.  Denies numbness, tingling, skin injury.  So far tried a Tylenol with minimal relief of pain.   Past Medical History:  Diagnosis Date   Arthritis    Asthma    Atrial fibrillation ([place])    CHF (congestive heart failure) ([place])    CKD (chronic kidney disease), stage III (HCC)    COPD (chronic obstructive pulmonary disease) (HCC)    Diabetes mellitus    INSULIN DEPENDENT   Gout    Heart murmur    no issues per pt   History of kidney stones    Hyperlipemia    Hypertension    Psoriasis    Renal disorder    congenital   Single kidney    Sleep apnea    doesn't use the Cpap    Patient Active Problem List   Diagnosis Date Noted   Pressure injury of skin 05/02/2021   Paroxysmal atrial fibrillation ([place]) 05/01/2021   GERD without esophagitis 05/01/2021   Hypotension 02/26/2021   Malnutrition of moderate degree 02/21/2021   Acute on chronic diastolic CHF (congestive heart failure) ([place]) 02/17/2021   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   Nausea without vomiting 05/16/2020   Atrial fibrillation ([place]) 10/31/2019   Secondary hypercoagulable state ([place]) 10/31/2019   Type 2 diabetes mellitus with hyperglycemia, with long-term current use of insulin ([place]) 02/02/2019   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 ([place]) 02/02/2019   Type 2 diabetes mellitus with stage 4  chronic kidney disease, with long-term current use of insulin ([place]) 02/02/2019   Acute renal failure superimposed on chronic kidney disease ([place]) 01/24/2019   Leukocytosis 01/24/2019   Hypokalemia 01/24/2019   Atrial fibrillation, chronic ([place]) 01/24/2019   Chronic anticoagulation 01/24/2019   Chronic kidney disease (CKD), stage IV (severe) ([place]) 10/31/2018   Acute on chronic combined systolic and diastolic CHF (congestive heart failure) ([place]) 11/23/2017   Paroxysmal atrial fibrillation with RVR ([place]) 04/08/2016   Acute on chronic renal failure ([place]) 03/09/2016   Diabetes mellitus with complication ([place]) 99/24/2683   Foot pain, bilateral 03/09/2016   Renal disorder    Bradycardia    CHF (congestive heart failure) ([place]) 02/06/2014   Cellulitis of right lower extremity 02/06/2014   Essential hypertension 02/06/2014   Hyperlipidemia 02/06/2014   Uncontrolled type 2 diabetes mellitus with hypoglycemia ([place]) 02/06/2014   Cellulitis 02/06/2014   Hypoxemia 12/07/2013   Type 2 diabetes mellitus, controlled, with renal complications ([place]) 41/96/2229   Kidney disease, chronic, stage III (GFR 30-59 ml/min) ([place]) 12/07/2013   Pulmonary hypertension ([place]) 12/07/2013   Chronic combined systolic and diastolic heart failure (HCC)    SOB (shortness of breath) 12/05/2013   COPD (chronic obstructive pulmonary disease) ([place])    Gout 11/11/2009   Obesity 11/11/2009   Hypertensive heart disease     Past Surgical  History:  Procedure Laterality Date   A/V FISTULAGRAM Left 10/31/2020   Procedure: A/V FISTULAGRAM;  Surgeon: Angelia Mould, MD;  Location: [facility] CV LAB;  Service: Cardiovascular;  Laterality: Left;   ABDOMINAL HYSTERECTOMY     AV FISTULA PLACEMENT Left 11/17/2018   Procedure: BRACHIO-CEPHALIC ARTERIOVENOUS (AV) FISTULA CREATION IN LEFT ARM;  Surgeon: Marty Heck, MD;  Location: [facility];  Service: Vascular;  Laterality: Left;   CARDIOVERSION N/A 03/03/2021   Procedure:  CARDIOVERSION;  Surgeon: Larey Dresser, MD;  Location: [facility] ENDOSCOPY;  Service: Cardiovascular;  Laterality: N/A;   CARDIOVERSION N/A 03/12/2021   Procedure: CARDIOVERSION;  Surgeon: Larey Dresser, MD;  Location: [facility] ENDOSCOPY;  Service: Cardiovascular;  Laterality: N/A;   CHOLECYSTECTOMY     INSERTION OF DIALYSIS CATHETER Right 01/26/2019   Procedure: INSERTION OF DIALYSIS CATHETER, right internal jugular;  Surgeon: Angelia Mould, MD;  Location: [facility];  Service: Vascular;  Laterality: Right;   LEFT AND RIGHT HEART CATHETERIZATION WITH CORONARY ANGIOGRAM N/A 11/29/2013   Procedure: LEFT AND RIGHT HEART CATHETERIZATION WITH CORONARY ANGIOGRAM;  Surgeon: Jacolyn Reedy, MD;  Location[facility] LAB;  Service: Cardiovascular;  Laterality: N/A;   LEFT HEART CATH AND CORONARY ANGIOGRAPHY N/A 02/24/2021   Procedure: LEFT HEART CATH AND CORONARY ANGIOGRAPHY;  Surgeon: Larey Dresser, MD;  Location: [facility] CV LAB;  Service: Cardiovascular;  Laterality: N/A;   PERIPHERAL VASCULAR INTERVENTION Left 10/31/2020   Procedure: PERIPHERAL VASCULAR INTERVENTION;  Surgeon: Angelia Mould, MD;  Location: [facility];  Service: Cardiovascular;  Laterality: Left;   RIGHT HEART CATH N/A 11/23/2017   Procedure: RIGHT HEART CATH;  Surgeon: Larey Dresser, MD;  Location: [facility];  Service: Cardiovascular;  Laterality: N/A;   RIGHT/LEFT HEART CATH AND CORONARY ANGIOGRAPHY N/A 06/15/2019   Procedure: RIGHT/LEFT HEART CATH AND CORONARY ANGIOGRAPHY;  Surgeon: Larey Dresser, MD;  Location: [facility];  Service: Cardiovascular;  Laterality: N/A;    OB History   No obstetric history on file.      Home Medications    Prior to Admission medications   Medication Sig Start Date End Date Taking? Authorizing Provider  acetaminophen (TYLENOL) 500 MG tablet Take 1,000 mg by mouth daily.    [provider]  albuterol (PROVENTIL HFA;VENTOLIN HFA) 108 (90 BASE) MCG/ACT  inhaler Inhale 2 puffs into the lungs in the morning and at bedtime.    [provider]  amiodarone (PACERONE) 200 MG tablet Take 200 mg by mouth daily.    [provider]  atorvastatin (LIPITOR) 80 MG tablet Take 80 mg by mouth at bedtime.    [provider]  Blood Glucose Monitoring Suppl (FREESTYLE LITE) DEVI Use as instructed to check blood sugar 4 times daily 12/29/20   Shamleffer, Melanie Crazier, MD  budesonide-formoterol (SYMBICORT) 160-4.5 MCG/ACT inhaler Inhale 2 puffs into the lungs 2 (two) times daily. 04/09/20   Collene Gobble, MD  Continuous Blood Gluc Receiver (FREESTYLE LIBRE READER) DEVI 1 kit by Does not apply route daily. Use as instructed E11.65 09/12/19   Shamleffer, Melanie Crazier, MD  diphenhydrAMINE (BENADRYL) 25 mg capsule Take 25 mg by mouth every 6 (six) hours as needed for itching.    [provider]  ELIQUIS 5 MG TABS tablet Take 1 tablet by mouth twice daily 07/23/20   Larey Dresser, MD  glucose blood (FREESTYLE LITE) test strip USE AS DIRECTED 4 TIMES DAILY 02/02/21   Shamleffer, Mammie Lorenzo  Amedeo Kinsman, MD  Insulin Degludec (TRESIBA [place]) Inject 20 Units into the skin daily.    [provider]  Insulin Pen Needle 32G X 4 MM MISC 1 Device by Does not apply route in the morning, at noon, in the evening, and at bedtime. 02/06/20   Shamleffer, Melanie Crazier, MD  ketoconazole (NIZORAL) 2 % cream Apply 1 application topically daily. 02/16/21   [provider]  latanoprost (XALATAN) 0.005 % ophthalmic solution Place 1 drop into both eyes at bedtime.    [provider]  lidocaine (LINDAMANTLE) 3 % CREA cream Apply 1 application topically every Monday, Wednesday, and Friday. Prior to dialysis 03/06/20   [provider]  Lidocaine 4 % PTCH Place 1 patch onto the skin daily. 11/12/20   [provider]  Menthol, Topical Analgesic, (BIOFREEZE) 4 % GEL Apply 1 application. topically in the morning, at noon, and at  bedtime.    [provider]  midodrine (PROAMATINE) 10 MG tablet Take 10 mg by mouth 3 (three) times daily. Hold if SBP>130    [provider]  Multiple Vitamin (MULTI-VITAMIN PO) Take 1 tablet by mouth daily.    [provider]  multivitamin (RENA-VIT) TABS tablet Take 1 tablet by mouth at bedtime. 03/05/21   Hosie Poisson, MD  pantoprazole (PROTONIX) 40 MG tablet Take 40 mg by mouth at bedtime. 10/26/18   [provider]  Pramoxine-Calamine (AVEENO ANTI-ITCH EX) Apply 1 application topically every 6 (six) hours as needed (itching).    [provider]  sucroferric oxyhydroxide (VELPHORO) 500 MG chewable tablet Chew 500 mg by mouth 3 (three) times daily with meals.    [provider]  traZODone (DESYREL) 50 MG tablet Take 25 mg by mouth at bedtime.    [provider]    Family History Family History  Problem Relation Age of Onset   Lupus Daughter    Cancer Mother 73       type unknown   CVA Father 6   Cancer Brother 72       type unknown    Social History Social History   Tobacco Use   Smoking status: Former    Packs/day: 1.00    Years: 20.00    Pack years: 20.00    Types: Cigarettes    Quit date: 02/22/2009    Years since quitting: 12.3   Smokeless tobacco: Never  Vaping Use   Vaping Use: Never used  Substance Use Topics   Alcohol use: Yes    Alcohol/week: 0.0 standard drinks    Comment: occasional beer   Drug use: Not Currently    Comment: marijuana in the past     Allergies   Eggs or egg-derived products, Lisinopril, Penicillins, and Other   Review of Systems Review of Systems Per HPI  Physical Exam Triage Vital Signs ED Triage Vitals  Enc Vitals Group     BP 07/13/21 1901 (!) 149/65     Pulse Rate 07/13/21 1901 74     Resp 07/13/21 1901 18     Temp 07/13/21 1901 99.3 F (37.4 C)     Temp Source 07/13/21 1901 Oral     SpO2 07/13/21 1901 93 %     Weight --      Height --      Head  Circumference --      Peak Flow --      Pain Score 07/13/21 1859 7     Pain Loc --  Pain Edu? --      Excl. in [place]? --    No data found.  Updated Vital Signs BP (!) 149/65 (BP Location: Right Arm)   Pulse 74   Temp 99.3 F (37.4 C) (Oral)   Resp 18   SpO2 93%   Visual Acuity Right Eye Distance:   Left Eye Distance:   Bilateral Distance:    Right Eye Near:   Left Eye Near:    Bilateral Near:     Physical Exam Vitals and nursing note reviewed.  Constitutional:      Appearance: Normal appearance. She is not ill-appearing.  HENT:     Head: Atraumatic.  Eyes:     Extraocular Movements: Extraocular movements intact.     Conjunctiva/sclera: Conjunctivae normal.  Cardiovascular:     Rate and Rhythm: Normal rate and regular rhythm.     Heart sounds: Normal heart sounds.  Pulmonary:     Effort: Pulmonary effort is normal.     Breath sounds: Normal breath sounds.  Musculoskeletal:     Cervical back: Normal range of motion and neck supple.     Comments: In wheelchair, ambulatory with a walker at baseline.  Significant tenderness to palpation radial aspect of right wrist, localized swelling over this area.  Able to move all 5 fingers of the right hand.  Skin:    General: Skin is warm and dry.  Neurological:     Mental Status: She is alert. Mental status is at baseline.     Comments: Right hand neurovascularly intact No gross neurologic deficit on exam  Psychiatric:        Mood and Affect: Mood normal.        Thought Content: Thought content normal.        Judgment: Judgment normal.     UC Treatments / Results  Labs (all labs ordered are listed, but only abnormal results are displayed) Labs Reviewed - No data to display  EKG   Radiology DG Wrist Complete Right  Result Date: 07/13/2021 CLINICAL DATA:  Fall with wrist pain EXAM: RIGHT WRIST - COMPLETE 3+ VIEW COMPARISON:  None Available. FINDINGS: Acute impacted and mildly comminuted intra-articular distal  radius fracture with mild dorsal displacement and angulation. Advanced arthritis at the first [facility] joint and STT interval. Positive for soft tissue swelling IMPRESSION: Acute mildly comminuted, displaced and impacted intra-articular distal radius fracture Electronically Signed   By: Donavan Foil M.D.   On: 07/13/2021 19:35    Procedures Procedures (including critical care time)  Medications Ordered in UC Medications - No data to display  Initial Impression / Assessment and Plan / UC Course  I have reviewed the triage vital signs and the nursing notes.  Pertinent labs & imaging results that were available during my care of the patient were reviewed by me and considered in my medical decision making (see chart for details).     Distal right radius comminuted fracture shown on x-ray of the right wrist, will place in a wrist brace, sling until she can go to the emergency department where a longer-term splint can be placed.  Do not have the capability to place this in the setting at this time.  Discussed the importance of further evaluation emergency department given her fall with head injury and anticoagulation.  Patient is agreeable and has someone with her to take her to the emergency department.  Final Clinical Impressions(s) / UC Diagnoses   Final diagnoses:  Fall, initial encounter  Injury of head,  initial encounter  Chronic anticoagulation  Closed fracture of distal end of right radius, unspecified fracture morphology, initial encounter     Discharge Instructions      Go directly to the emergency department for further evaluation.  They will be able to place a more long-term splint on your right wrist in the setting.    ED Prescriptions   None    PDMP not reviewed this encounter.   Volney American, [place] 07/13/21 1956

## 2021-07-13 NOTE — ED Notes (Addendum)
Pt verbalizes understanding of discharge instructions. Opportunity for questioning and answers were provided. Pt discharged from ED to home with family.    

## 2021-07-13 NOTE — Discharge Instructions (Addendum)
You can take Tylenol 650 mg every 6 hours as needed for pain.

## 2021-07-14 ENCOUNTER — Encounter: Payer: HMO | Admitting: Family Medicine

## 2021-07-16 ENCOUNTER — Ambulatory Visit (HOSPITAL_COMMUNITY): Admission: RE | Admit: 2021-07-16 | Payer: Medicare (Managed Care) | Source: Ambulatory Visit

## 2021-08-04 ENCOUNTER — Ambulatory Visit (HOSPITAL_COMMUNITY)
Admission: RE | Admit: 2021-08-04 | Discharge: 2021-08-04 | Disposition: A | Discharge status: Home / Self Care | Payer: Medicare (Managed Care) | Source: Ambulatory Visit | Attending: Family Medicine | Admitting: Family Medicine

## 2021-08-04 DIAGNOSIS — Z0001 Encounter for general adult medical examination with abnormal findings: Secondary | ICD-10-CM | POA: Diagnosis present

## 2021-08-20 ENCOUNTER — Other Ambulatory Visit: Payer: Self-pay

## 2021-08-20 ENCOUNTER — Emergency Department (HOSPITAL_BASED_OUTPATIENT_CLINIC_OR_DEPARTMENT_OTHER)
Admission: EM | Admit: 2021-08-20 | Discharge: 2021-08-21 | Disposition: A | Discharge status: Home / Self Care | Payer: Medicare (Managed Care) | Attending: Emergency Medicine | Admitting: Emergency Medicine

## 2021-08-20 ENCOUNTER — Encounter (HOSPITAL_BASED_OUTPATIENT_CLINIC_OR_DEPARTMENT_OTHER): Payer: Self-pay | Admitting: Emergency Medicine

## 2021-08-20 DIAGNOSIS — I509 Heart failure, unspecified: Secondary | ICD-10-CM | POA: Diagnosis not present

## 2021-08-20 DIAGNOSIS — Z79899 Other long term (current) drug therapy: Secondary | ICD-10-CM | POA: Diagnosis not present

## 2021-08-20 DIAGNOSIS — N183 Chronic kidney disease, stage 3 unspecified: Secondary | ICD-10-CM | POA: Insufficient documentation

## 2021-08-20 DIAGNOSIS — Z87891 Personal history of nicotine dependence: Secondary | ICD-10-CM | POA: Insufficient documentation

## 2021-08-20 DIAGNOSIS — J449 Chronic obstructive pulmonary disease, unspecified: Secondary | ICD-10-CM | POA: Diagnosis not present

## 2021-08-20 DIAGNOSIS — I130 Hypertensive heart and chronic kidney disease with heart failure and stage 1 through stage 4 chronic kidney disease, or unspecified chronic kidney disease: Secondary | ICD-10-CM | POA: Insufficient documentation

## 2021-08-20 DIAGNOSIS — Z951 Presence of aortocoronary bypass graft: Secondary | ICD-10-CM | POA: Insufficient documentation

## 2021-08-20 DIAGNOSIS — Z7901 Long term (current) use of anticoagulants: Secondary | ICD-10-CM | POA: Insufficient documentation

## 2021-08-20 DIAGNOSIS — Z4689 Encounter for fitting and adjustment of other specified devices: Secondary | ICD-10-CM | POA: Diagnosis present

## 2021-08-20 DIAGNOSIS — Z794 Long term (current) use of insulin: Secondary | ICD-10-CM | POA: Diagnosis not present

## 2021-08-20 DIAGNOSIS — J45909 Unspecified asthma, uncomplicated: Secondary | ICD-10-CM | POA: Insufficient documentation

## 2021-08-20 DIAGNOSIS — E1122 Type 2 diabetes mellitus with diabetic chronic kidney disease: Secondary | ICD-10-CM | POA: Insufficient documentation

## 2021-08-20 HISTORY — DX: Dependence on renal dialysis: Z99.2

## 2021-08-20 LAB — CBG MONITORING, ED: Glucose-Capillary: 106 mg/dL — ABNORMAL HIGH (ref 70–99)

## 2021-08-20 NOTE — ED Notes (Signed)
Pt requested to have her CBG taken due to being a diabetic. Reading showing 106.

## 2021-08-20 NOTE — ED Notes (Signed)
Pt from home with daughter; her cast is due to be removed on 7/11; she has pain and swelling to her right hand. [facility] is her PCP and they are concerned for circulation issues with the cast. Pt has warm fingers; uta cap refill as pt is wearing nail polish. Pt alert &  oriented, nad noted.

## 2021-08-20 NOTE — ED Provider Notes (Signed)
[facility] Provider Note: Georgena Spurling, MD, FACEP  CSN: [id number] MRN: [id number] ARRIVAL: 08/20/21 at [place]: [id number]   CHIEF COMPLAINT  Cast Removal   HISTORY OF PRESENT ILLNESS  08/20/21 11:53 PM Stephanie Woodward is a 81 y.o. female who had a fracture of her right wrist treated on 07/13/2021.  She is due to have the cast removed on 08/25/2021 but this appointment was moved to 09/01/2021 due to a family conflict.  She is here requesting that the cast be removed due to increasing pain and irritation of her hand and wrist over the past several days.  She rates her pain as an 8 out of 10.  She is still able to move her fingers and has intact sensation in her fingers.   Past Medical History:  Diagnosis Date   Arthritis    Asthma    Atrial fibrillation (HCC)    CHF (congestive heart failure) ([place])    CKD (chronic kidney disease), stage III (HCC)    COPD (chronic obstructive pulmonary disease) ([place])    Diabetes mellitus    INSULIN DEPENDENT   Dialysis patient ([place])    Gout    Heart murmur    no issues per pt   History of kidney stones    Hyperlipemia    Hypertension    Psoriasis    Renal disorder    congenital   Single kidney    Sleep apnea    doesn't use the Cpap    Past Surgical History:  Procedure Laterality Date   A/V FISTULAGRAM Left 10/31/2020   Procedure: A/V FISTULAGRAM;  Surgeon: Angelia Mould, MD;  Location: [place] [facility];  Service: Cardiovascular;  Laterality: Left;   ABDOMINAL HYSTERECTOMY     AV FISTULA PLACEMENT Left 11/17/2018   Procedure: BRACHIO-CEPHALIC ARTERIOVENOUS (AV) FISTULA CREATION IN LEFT ARM;  Surgeon: Marty Heck, MD;  Location: [place];  Service: Vascular;  Laterality: Left;   CARDIOVERSION N/A 03/03/2021   Procedure: CARDIOVERSION;  Surgeon: Larey Dresser, MD;  Location: [facility] ENDOSCOPY;  Service: Cardiovascular;  Laterality: N/A;   CARDIOVERSION N/A 03/12/2021   Procedure: CARDIOVERSION;  Surgeon: Larey Dresser, MD;  Location: [facility] ENDOSCOPY;  Service: Cardiovascular;  Laterality: N/A;   CHOLECYSTECTOMY     INSERTION OF DIALYSIS CATHETER Right 01/26/2019   Procedure: INSERTION OF DIALYSIS CATHETER, right internal jugular;  Surgeon: Angelia Mould, MD;  Location: [place];  Service: Vascular;  Laterality: Right;   LEFT AND RIGHT HEART CATHETERIZATION WITH CORONARY ANGIOGRAM N/A 11/29/2013   Procedure: LEFT AND RIGHT HEART CATHETERIZATION WITH CORONARY ANGIOGRAM;  Surgeon: Jacolyn Reedy, MD;  Location: [facility] CATH LAB;  Service: Cardiovascular;  Laterality: N/A;   LEFT HEART CATH AND CORONARY ANGIOGRAPHY N/A 02/24/2021   Procedure: LEFT HEART CATH AND CORONARY ANGIOGRAPHY;  Surgeon: Larey Dresser, MD;  Location: [place] [facility];  Service: Cardiovascular;  Laterality: N/A;   PERIPHERAL VASCULAR INTERVENTION Left 10/31/2020   Procedure: PERIPHERAL VASCULAR INTERVENTION;  Surgeon: Angelia Mould, MD;  Location: [place] [facility];  Service: Cardiovascular;  Laterality: Left;   RIGHT HEART CATH N/A 11/23/2017   Procedure: RIGHT HEART CATH;  Surgeon: Larey Dresser, MD;  Location: [place] [facility];  Service: Cardiovascular;  Laterality: N/A;   RIGHT/LEFT HEART CATH AND CORONARY ANGIOGRAPHY N/A 06/15/2019   Procedure: RIGHT/LEFT HEART CATH AND CORONARY ANGIOGRAPHY;  Surgeon: Larey Dresser, MD;  Location: [place] [facility];  Service: Cardiovascular;  Laterality: N/A;  Family History  Problem Relation Age of Onset   Lupus Daughter    Cancer Mother 33       type unknown   CVA Father 36   Cancer Brother 43       type unknown    Social History   Tobacco Use   Smoking status: Former    Packs/day: 1.00    Years: 20.00    Total pack years: 20.00    Types: Cigarettes    Quit date: 02/22/2009    Years since quitting: 12.5   Smokeless tobacco: Never  Vaping Use   Vaping Use: Never used  Substance Use Topics   Alcohol use: Yes    Alcohol/week: 0.0 standard drinks of alcohol     Comment: occasional beer   Drug use: Not Currently    Comment: marijuana in the past    Prior to Admission medications   Medication Sig Start Date End Date Taking? Authorizing Provider  acetaminophen (TYLENOL) 500 MG tablet Take 1,000 mg by mouth daily.    [provider]  albuterol (PROVENTIL HFA;VENTOLIN HFA) 108 (90 BASE) MCG/ACT inhaler Inhale 2 puffs into the lungs in the morning and at bedtime.    [provider]  amiodarone (PACERONE) 200 MG tablet Take 200 mg by mouth daily.    [provider]  atorvastatin (LIPITOR) 80 MG tablet Take 80 mg by mouth at bedtime.    [provider]  Blood Glucose Monitoring Suppl (FREESTYLE LITE) DEVI Use as instructed to check blood sugar 4 times daily 12/29/20   Shamleffer, Melanie Crazier, MD  budesonide-formoterol (SYMBICORT) 160-4.5 MCG/ACT inhaler Inhale 2 puffs into the lungs 2 (two) times daily. 04/09/20   Collene Gobble, MD  Continuous Blood Gluc Receiver (FREESTYLE LIBRE READER) DEVI 1 kit by Does not apply route daily. Use as instructed E11.65 09/12/19   Shamleffer, Melanie Crazier, MD  diphenhydrAMINE (BENADRYL) 25 mg capsule Take 25 mg by mouth every 6 (six) hours as needed for itching.    [provider]  ELIQUIS 5 MG TABS tablet Take 1 tablet by mouth twice daily 07/23/20   Larey Dresser, MD  glucose blood (FREESTYLE LITE) test strip USE AS DIRECTED 4 TIMES DAILY 02/02/21   Shamleffer, Melanie Crazier, MD  Insulin Degludec (TRESIBA Carbon) Inject 20 Units into the skin daily.    [provider]  Insulin Pen Needle 32G X 4 MM MISC 1 Device by Does not apply route in the morning, at noon, in the evening, and at bedtime. 02/06/20   Shamleffer, Melanie Crazier, MD  ketoconazole (NIZORAL) 2 % cream Apply 1 application topically daily. 02/16/21   [provider]  latanoprost (XALATAN) 0.005 % ophthalmic solution Place 1 drop into both eyes at bedtime.    [provider]  lidocaine  (LINDAMANTLE) 3 % CREA cream Apply 1 application topically every Monday, Wednesday, and Friday. Prior to dialysis 03/06/20   [provider]  Lidocaine 4 % PTCH Place 1 patch onto the skin daily. 11/12/20   [provider]  Menthol, Topical Analgesic, (BIOFREEZE) 4 % GEL Apply 1 application. topically in the morning, at noon, and at bedtime.    [provider]  midodrine (PROAMATINE) 10 MG tablet Take 10 mg by mouth 3 (three) times daily. Hold if SBP>130    [provider]  Multiple Vitamin (MULTI-VITAMIN PO) Take 1 tablet by mouth daily.    [provider]  multivitamin (RENA-VIT) TABS tablet Take 1 tablet by mouth at  bedtime. 03/05/21   Hosie Poisson, MD  pantoprazole (PROTONIX) 40 MG tablet Take 40 mg by mouth at bedtime. 10/26/18   [provider]  Pramoxine-Calamine (AVEENO ANTI-ITCH EX) Apply 1 application topically every 6 (six) hours as needed (itching).    [provider]  sucroferric oxyhydroxide (VELPHORO) 500 MG chewable tablet Chew 500 mg by mouth 3 (three) times daily with meals.    [provider]  traZODone (DESYREL) 50 MG tablet Take 25 mg by mouth at bedtime.    [provider]    Allergies Eggs or egg-derived products, Lisinopril, Penicillins, and Other   REVIEW OF SYSTEMS  Negative except as noted here or in the History of Present Illness.   PHYSICAL EXAMINATION  Initial Vital Signs Blood pressure (!) 157/67, pulse 70, temperature 97.8 F (36.6 C), resp. rate 18, SpO2 93 %.  Examination General: Well-developed, well-nourished female in no acute distress; appearance consistent with age of record HENT: normocephalic; atraumatic Eyes: Normal appearance Neck: supple Heart: regular rate and rhythm Lungs: clear to auscultation bilaterally Abdomen: soft; nondistended; nontender; bowel sounds present Extremities: No deformity; full range of motion; right forearm and wrist in cast, fingers  neurovascularly intact Neurologic: Awake, alert and oriented; motor function intact in all extremities and symmetric; no facial droop Skin: Warm and dry Psychiatric: Normal mood and affect   RESULTS  Summary of this visit's results, reviewed and interpreted by myself:   EKG Interpretation  Date/Time:    Ventricular Rate:    PR Interval:    QRS Duration:   QT Interval:    QTC Calculation:   R Axis:     Text Interpretation:         Laboratory Studies: Results for orders placed or performed during the hospital encounter of 08/20/21 (from the past 24 hour(s))  CBG monitoring, ED     Status: Abnormal   Collection Time: 08/20/21 10:51 PM  Result Value Ref Range   Glucose-Capillary 106 (H) 70 - 99 mg/dL   Imaging Studies: DG Wrist Complete Right  Result Date: 08/21/2021 CLINICAL DATA:  Right wrist pain EXAM: RIGHT WRIST - COMPLETE 3+ VIEW COMPARISON:  07/13/2021 FINDINGS: The osseous structures are diffusely osteopenic. Comminuted, impacted fracture of the distal right radius is again seen with the dominant fracture plane noted transversely through the metaphysis with dorsal angulation of the distal fracture fragment and mild resultant dorsal tilt of the distal radial articular surface. This appears stable since prior examination. There is an additional minimally displaced longitudinal fracture plane extending into the lunate fossa of the distal radial articular surface with no significant articular incongruity. Radiocarpal alignment appears preserved. There is progressive callus formation in keeping with interval healing. Vascular calcifications are noted. Degenerative changes are seen at the base of the thumb involving the triscaphe and first carpometacarpal joints. Moderate surrounding soft tissue swelling again noted. IMPRESSION: Healing, impacted, intra-articular fracture of the distal right radius. Mild dorsal tilt of the distal radial articular surface. No significant interval change  in alignment. Electronically Signed   By: Fidela Salisbury M.D.   On: 08/21/2021 01:31    ED COURSE and MDM  Nursing notes, initial and subsequent vitals signs, including pulse oximetry, reviewed and interpreted by myself.  Vitals:   08/20/21 1843 08/21/21 0135  BP: (!) 157/67 (!) 141/58  Pulse: 70 64  Resp: 18 20  Temp: 97.8 F (36.6 C)   SpO2: 93% 96%   Medications - No data to display  We will place the patient  in a Velcro wrist splint and refer back to orthopedics.  Fracture is still healing and will benefit from continued immobilization.  The patient and her family member tell me the plan was for her to be placed in a removable splint following cast removal at her scheduled orthopedic appointment.   PROCEDURES  Procedures Patient's cast removed using Stryker-type orthopedic saw.  The patient's skin was undamaged during the procedure.  The patient tolerated this well and there were no immediate complications.  Post removal x-ray obtained  ED DIAGNOSES     ICD-10-CM   1. Encounter for cast removal  Z46.89          Ramia Sidney, Jenny Reichmann, MD 08/21/21 [phone]

## 2021-08-20 NOTE — ED Notes (Signed)
Pt reported to me when doing rounds she just wanted someone to look at her right hand to make sure cast is not rubbing or causing swelling.

## 2021-08-21 ENCOUNTER — Emergency Department (HOSPITAL_BASED_OUTPATIENT_CLINIC_OR_DEPARTMENT_OTHER): Payer: Medicare (Managed Care) | Admitting: Radiology

## 2021-08-21 DIAGNOSIS — Z4689 Encounter for fitting and adjustment of other specified devices: Secondary | ICD-10-CM | POA: Diagnosis not present

## 2021-08-21 MED ORDER — AEROCHAMBER PLUS FLO-VU MISC
1.0000 | Freq: Once | Status: DC
  Administered 2021-08-21: 1

## 2021-08-21 MED ORDER — ALBUTEROL SULFATE HFA 108 (90 BASE) MCG/ACT IN AERS
2.0000 | INHALATION_SPRAY | RESPIRATORY_TRACT | Status: DC | PRN
Start: 2021-08-21 — End: 2021-08-21
  Administered 2021-08-21: 2 via RESPIRATORY_TRACT
  Filled 2021-08-21: qty 6.7

## 2021-08-21 MED ORDER — AEROCHAMBER PLUS FLO-VU MISC
1.0000 | Freq: Once | Status: AC
  Administered 2021-08-21: 1
  Filled 2021-08-21: qty 1

## 2021-08-21 NOTE — ED Notes (Signed)
RT educated pt and family on proper use of MDI w/spacer. Pt given two spacers one for albuterol MDI and Home Symbicort MDI for better deposition of medication and symptom relief.

## 2021-08-21 NOTE — ED Notes (Signed)
Called Dr. Lorin Mercy for Dr. Florina Ou

## 2021-08-21 NOTE — ED Notes (Signed)
Patient transported to X-ray 

## 2021-08-21 NOTE — ED Notes (Signed)
Discharge instructions were discussed with pt. Pt verbalized understanding with no questions at this time. Pt to go home with caregiver at bedside.

## 2021-08-21 NOTE — ED Notes (Signed)
Discharge instructions discussed with pt. Pt verbalized understanding with no questions at this time. NT to apply splint and RT to see pt prior to departure.

## 2021-10-20 ENCOUNTER — Encounter (HOSPITAL_COMMUNITY): Payer: Self-pay

## 2021-10-20 ENCOUNTER — Ambulatory Visit (HOSPITAL_COMMUNITY)
Admission: RE | Admit: 2021-10-20 | Discharge: 2021-10-20 | Disposition: A | Discharge status: Home / Self Care | Payer: Medicare (Managed Care) | Source: Ambulatory Visit | Attending: Family Medicine | Admitting: Family Medicine

## 2021-10-20 VITALS — BP 148/60 | HR 60 | Wt 185.4 lb

## 2021-10-20 DIAGNOSIS — N186 End stage renal disease: Secondary | ICD-10-CM | POA: Diagnosis not present

## 2021-10-20 DIAGNOSIS — Z79899 Other long term (current) drug therapy: Secondary | ICD-10-CM | POA: Diagnosis not present

## 2021-10-20 DIAGNOSIS — I480 Paroxysmal atrial fibrillation: Secondary | ICD-10-CM | POA: Diagnosis not present

## 2021-10-20 DIAGNOSIS — E1122 Type 2 diabetes mellitus with diabetic chronic kidney disease: Secondary | ICD-10-CM | POA: Diagnosis not present

## 2021-10-20 DIAGNOSIS — I5042 Chronic combined systolic (congestive) and diastolic (congestive) heart failure: Secondary | ICD-10-CM | POA: Diagnosis present

## 2021-10-20 DIAGNOSIS — I132 Hypertensive heart and chronic kidney disease with heart failure and with stage 5 chronic kidney disease, or end stage renal disease: Secondary | ICD-10-CM | POA: Insufficient documentation

## 2021-10-20 DIAGNOSIS — Z7901 Long term (current) use of anticoagulants: Secondary | ICD-10-CM | POA: Diagnosis not present

## 2021-10-20 DIAGNOSIS — I5022 Chronic systolic (congestive) heart failure: Secondary | ICD-10-CM

## 2021-10-20 DIAGNOSIS — I2510 Atherosclerotic heart disease of native coronary artery without angina pectoris: Secondary | ICD-10-CM

## 2021-10-20 DIAGNOSIS — I272 Pulmonary hypertension, unspecified: Secondary | ICD-10-CM | POA: Diagnosis not present

## 2021-10-20 DIAGNOSIS — Z992 Dependence on renal dialysis: Secondary | ICD-10-CM

## 2021-10-20 DIAGNOSIS — Z794 Long term (current) use of insulin: Secondary | ICD-10-CM | POA: Diagnosis not present

## 2021-10-20 DIAGNOSIS — E785 Hyperlipidemia, unspecified: Secondary | ICD-10-CM

## 2021-10-20 NOTE — Patient Instructions (Addendum)
It was great to see you today! No medication changes are needed at this time.  Your physician has requested that you have cardiac CT. Cardiac computed tomography (CT) is a painless test that uses an x-ray machine to take clear, detailed pictures of your heart. For further information please visit [email or web address]. Please follow instruction sheet as given. -they will call you with appointment once approved with insurance    Your physician recommends that you schedule a follow-up appointment in: 3-4 months with Dr Aundra Dubin   Do the following things EVERYDAY: Weigh yourself in the morning before breakfast. Write it down and keep it in a log. Take your medicines as prescribed Eat low salt foods--Limit salt (sodium) to 2000 mg per day.  Stay as active as you can everyday Limit all fluids for the day to less than 2 liters  At the [facility], you and your health needs are our priority. As part of our continuing mission to provide you with exceptional heart care, we have created designated Provider Care Teams. These Care Teams include your primary Cardiologist (physician) and Advanced Practice Providers (APPs- Physician Assistants and Nurse Practitioners) who all work together to provide you with the care you need, when you need it.   You may see any of the following providers on your designated Care Team at your next follow up: Dr Glori Bickers Dr Loralie Champagne Dr. Roxana Hires, NP Lyda Jester, [place] [facility] [place], [place] Forestine Na, NP Audry Riles, PharmD   Please be sure to bring in all your medications bottles to every appointment.   If you have any questions or concerns before your next appointment please send [place] a message through [place] or call our office at [phone].    TO LEAVE A MESSAGE FOR THE NURSE SELECT OPTION 2, PLEASE LEAVE A MESSAGE INCLUDING: YOUR NAME DATE OF BIRTH CALL BACK NUMBER REASON FOR CALL**this is  important as we prioritize the call backs  YOU WILL RECEIVE A CALL BACK THE SAME DAY AS LONG AS YOU CALL BEFORE 4:00 PM

## 2021-10-20 NOTE — Progress Notes (Signed)
Advanced Heart Failure Clinic Note  PCP: [facility] [facility] Cardiology: Dr. Aundra Dubin Nephrology: Dr Hollie Salk.   81 y.o.with history of ESRD, paroxysmal atrial fibrillation, and chronic diastolic CHF.  RHC/LHC was done in 10/15.  This showed markedly elevated filling pressures and pulmonary venous hypertension.  There was no significant CAD.  Last echo in 2/18 showed EF 50-55%, mild LVH, grade 2 diastolic dysfunction.   Admitted 10/2-10/6/19 with SOB. RHC showed elevated filling pressures and preserved CO. She was diuresed with IV lasix, then transitioned to torsemide 80 mg am, 40 mg pm. Repeat echo showed EF 45-50% with normal RV. PYP scan was negative for TTR amyloid. PFTs showed restrictive disease. CT chest showed no ILD but did show right hydronephrosis. Renal US showed single kidney with mild to moderate hydronephrosis on right (unchanged since 2011).  DC weight 208 lbs.    In 12/20, she progressed to ESRD and was started on HD.    Echo in 1/21 showed EF 55-60% with normal.  RHC/LHC in 4/21 showed no obstructive CAD, normal filling pressures, mild pulmonary hypertension.  She had atrial fibrillation in 9/21 and underwent DCCV to NSR.   4/22 was seen in ED for acute dyspnea. Found to be in rapid Afib. This was in setting of recent consumption of high caffeine energy drink. She was given IV Cardizem and converted back to NSR and was released same day.  Admitted 12/22 with atrial fibrillation with RVR. She was cardioverted in ED to SR with PACs. She was started on amio and Toprol. Unfortunately she was found unresponsive, intubated and transferred to ICU on NE. Code stroke activated. MRI brain without acute process. 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  She required CVVH but was able to transition to [facility] after extubation. Echo showed EF 30-35% range, worse function in septum; mild RV dysfunction, IVC  dilated. She underwent LHC showing no obstructive CAD. 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 She went back into afib requiring IV amiodarone and had successful DCCV 03/03/21.  At post hospital visit on 02/28/21, back in A fib. On 03/12/21 she underwent successful cardioversion. Echo 4/23 showed EF 55-60%, mild LVH, normal RV.   Today she returns for HF follow up with her daughter. Overall feeling fine. She lives with her daughter and has PACE services. She uses a walker for balance and does not have significant dyspnea with ADLs. Uses a wheelchair when out of the house. Denies abnormal bleeding, palpitations, CP, dizziness, edema, or PND/Orthopnea. Appetite ok. No fever or chills. Taking all medications. No issues at dialysis or low BPs.  ECG (personally reviewed): none ordered tday.  Labs (2/18): K 4, creatinine 1.79 Labs (10/19): K 4.5, creatinine 2.4 Labs (12/19): LDL 62 Labs (1/20): K 3.9, creatinine 2.72 Labs (12/20): hgb 11.9 Labs (9/21): LDL 64, HDL 53 Labs (4/21): SCr 4.19, K 3.8  Labs (1/23): K 3.6, creatinine 4.89, hgb 10.5 Labs (4/23): K 4.3, creatinine 5.60, LFTs normal, TSH normal  PMH: 1. Atrial fibrillation: Paroxysmal.  2. HTN 3. 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  5. ESRD 6. H/o IVCD 7. COPD: PFTs (10/19) actually showed severe restriction, concern for interstitial process.  CT chest (10/19) showed mild-moderate patchy air trapping but no evidence for interstitial lung disease.  8. Gout 9. Chronic primarily diastolic CHF: Echo (4/09)  with EF 50-55%, mild LVH, grade 2 diastolic dysfunction.  - LHC/RHC (10/15): small distal LAD but no discrete stenosis.  Mean RA 15, PA 55/20 mean 34, mean PCWP 31 => pulmonary venous hypertension.  - PYP scan (10/19): No evidence for TTR amyloidosis.  - Echo (10/19): EF 45-50%,  mild LVH, normal RV size and systolic function.  - RHC (10/19): mean RA 13, PA 74/27 mean 50, mean PCWP 28, CI 3.12, PVR 3.6 WU. Pulmonary venous hypertension.  - Echo (1/21): EF 55-60%, normal RV.  - LHC/RHC (4/21): super-dominant LCx with small LAD and RCA, no CAD; mean RA 5, PA 47/15, mean PCWP 10, CI 3.61, PVR 2.5 WU.  - Echo (1/23): EF 30-35% range, worse function in septum; mild RV dysfunction, IVC dilated.  - LHC (1/23): no obstructive CAD. 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   - Echo (4/23): EF 55-60%, mild LVH, normal RV.  10. Negative sleep study in 6/21.   Review of systems complete and found to be negative unless listed in HPI.    Social History   Socioeconomic History   Marital status: Widowed    Spouse name: Not on file   Number of children: 2   Years of education: Not on file   Highest education level: Not on file  Occupational History   Occupation: retired  Tobacco Use   Smoking status: Former    Packs/day: 1.00    Years: 20.00    Total pack years: 20.00    Types: Cigarettes    Quit date: 02/22/2009    Years since quitting: 12.6   Smokeless tobacco: Never  Vaping Use   Vaping Use: Never used  Substance and Sexual Activity   Alcohol use: Yes    Alcohol/week: 0.0 standard drinks of alcohol    Comment: occasional beer   Drug use: Not Currently    Comment: marijuana in the past   Sexual activity: Never  Other Topics Concern   Not on file  Social History Narrative   Not on file   Social Determinants of Health   Financial Resource Strain: Low Risk  (02/27/2021)   Overall Financial Resource Strain (CARDIA)    Difficulty of Paying Living Expenses: Not very hard  Food Insecurity: No Food Insecurity (02/27/2021)   Hunger Vital Sign    Worried About Running Out of Food in the Last Year: Never true    Ran Out of Food in the Last Year: Never true  Transportation Needs: No Transportation Needs  (02/27/2021)   PRAPARE - Hydrologist (Medical): No    Lack of Transportation (Non-Medical): No  Physical Activity: Not on file  Stress: Not on file  Social Connections: Not on file  Intimate Partner Violence: Not on file   Family History  Problem Relation Age of Onset   Lupus Daughter    Cancer Mother 4       type unknown   CVA Father 29   Cancer Brother 3       type unknown   Review of systems complete and found to be negative unless listed in HPI.    Current Outpatient Medications  Medication Sig Dispense Refill   acetaminophen (TYLENOL) 500 MG tablet Take 1,000 mg by mouth daily.     albuterol (PROVENTIL HFA;VENTOLIN HFA) 108 (90 BASE) MCG/ACT inhaler Inhale 2 puffs into the lungs in the morning and at bedtime.     amiodarone (PACERONE)  200 MG tablet Take 200 mg by mouth daily.     atorvastatin (LIPITOR) 80 MG tablet Take 80 mg by mouth at bedtime.     budesonide-formoterol (SYMBICORT) 160-4.5 MCG/ACT inhaler Inhale 2 puffs into the lungs 2 (two) times daily. 1 each 0   diphenhydrAMINE (BENADRYL) 25 mg capsule Take 25 mg by mouth every 6 (six) hours as needed for itching.     ELIQUIS 5 MG TABS tablet Take 1 tablet by mouth twice daily 180 tablet 3   insulin glargine-yfgn (SEMGLEE) 100 UNIT/ML injection Inject 12 Units into the skin daily.     Insulin Pen Needle 32G X 4 MM MISC 1 Device by Does not apply route in the morning, at noon, in the evening, and at bedtime. 400 each 3   ketoconazole (NIZORAL) 2 % cream Apply 1 application topically daily.     latanoprost (XALATAN) 0.005 % ophthalmic solution Place 1 drop into both eyes at bedtime.     lidocaine (LINDAMANTLE) 3 % CREA cream Apply 1 application topically every Monday, Wednesday, and Friday. Prior to dialysis     Lidocaine 4 % PTCH Place 1 patch onto the skin daily.     Menthol, Topical Analgesic, (BIOFREEZE) 4 % GEL Apply 1 application. topically in the morning, at noon, and at bedtime.      midodrine (PROAMATINE) 10 MG tablet Take 10 mg by mouth 3 (three) times daily. Hold if SBP>130     pantoprazole (PROTONIX) 40 MG tablet Take 40 mg by mouth at bedtime.     Pramoxine-Calamine (AVEENO ANTI-ITCH EX) Apply 1 application topically every 6 (six) hours as needed (itching).     traZODone (DESYREL) 50 MG tablet Take 50 mg by mouth at bedtime.     No current facility-administered medications for this encounter.   BP (!) 148/60   Pulse 60   Wt 84.1 kg (185 lb 6.4 oz)   SpO2 93%   BMI 32.84 kg/m   Wt Readings from Last 3 Encounters:  10/20/21 84.1 kg (185 lb 6.4 oz)  07/13/21 84.8 kg (186 lb 15.2 oz)  07/09/21 84.8 kg (187 lb)   Physical Exam: General:  NAD. No resp difficulty, arrived in [facility], elderly HEENT: Normal Neck: Supple. No JVD. Carotids 2+ bilat; no bruits. No lymphadenopathy or thryomegaly appreciated. Cor: PMI nondisplaced. Regular rate & rhythm. No rubs, gallops or murmurs. Lungs: Clear Abdomen: Soft, nontender, nondistended. No hepatosplenomegaly. No bruits or masses. Good bowel sounds. Extremities: No cyanosis, clubbing, rash, edema; LUE AVF +/+ Neuro: Alert & oriented x 3, cranial nerves grossly intact. Moves all 4 extremities w/o difficulty. Affect pleasant.  Assessment/Plan: 1.  Atrial fibrillation: Known hx paroxysmal AF.  Seen in ED at [facility] 12/22 for AF and spontaneously converted to [place].  Presented 02/17/21 with AF with RVR. Cardioverted in ER.  She was in junctional rhythm after respiratory arrest and intubation with rate upper 40s-50s. Rhythm on 02/24/21  appeared to be wandering atrial pacemaker, and amiodarone stopped.  Reverted back to AF with RVR and amiodarone restarted. S/p DCCV 03/03/21 and 03/12/21.  She appears to have remained in NSR since then, regular on exam today.  - Continue amiodarone 200 mg daily.  LFTs and TSH normal 4/23.  She will need a regular eye exam.   - Continue Eliquis 5 mg bid. No bleeding issues.  2. Chronic systolic CHF:  Echo in 2/95 with EF 50-55%, mild LVH, grade 2 diastolic dysfunction.  LHC in 2015 showed nonobstructive CAD and elevated  filling pressures with pulmonary venous hypertension.  [place] 11/23/17 showed significantly elevated right and left heart filling pressures and severe primarily pulmonary venous hypertension. Cardiac output preserved. Echo (10/19) with EF 45-50%, septal-lateral dyssynchrony, RV looked ok.  PYP scan was not suggestive of transthyretin amyloidosis.  Echo in 1/21 with EF 55-60%, normal RV.  RHC/LHC in 4/21 with no significant CAD, normal filling pressures and cardiac output.  Echo 1/23 showed EF down to 35%.  HS-TnI 385 => 378, likely demand ischemia with hypotension/respiratory arrest.  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  Chronic NYHA class III symptoms, likely deconditioning plays a large role.  - Continue midodrine 10 mg tid, no BP room for GDMT.   - Will arrange coronary CT to make sure we are not missing an anomalous vessel with unusual coronary tree on cath.  3. ESRD: Tolerating iHD.  - Continue midodrine 10 mg tid.  4. Pulmonary HTN: Mild PH on 4/21 RHC.  5. Hyperlipidemia: Continue statin.  6. DM II: She is on insulin.  Followup 3-4 months with Dr. Aundra Dubin.  Maricela Bo [facility] FNP-BC 10/20/2021

## 2021-11-24 ENCOUNTER — Encounter (HOSPITAL_COMMUNITY): Payer: Self-pay

## 2021-12-23 ENCOUNTER — Telehealth (HOSPITAL_COMMUNITY): Payer: Self-pay | Admitting: *Deleted

## 2021-12-23 NOTE — Telephone Encounter (Signed)
Attempted to call patient's contact regarding upcoming cardiac CT appointment. Left message on voicemail with name and callback number  Gordy Clement RN Navigator Cardiac [facility] [phone] Office [phone] Cell

## 2021-12-24 ENCOUNTER — Ambulatory Visit (HOSPITAL_COMMUNITY)
Admission: RE | Admit: 2021-12-24 | Discharge: 2021-12-24 | Disposition: A | Discharge status: Home / Self Care | Payer: Medicare (Managed Care) | Source: Ambulatory Visit | Attending: Cardiology | Admitting: Cardiology

## 2021-12-24 ENCOUNTER — Other Ambulatory Visit: Payer: Self-pay | Admitting: Internal Medicine

## 2021-12-24 ENCOUNTER — Ambulatory Visit (HOSPITAL_BASED_OUTPATIENT_CLINIC_OR_DEPARTMENT_OTHER)
Admission: RE | Admit: 2021-12-24 | Discharge: 2021-12-24 | Disposition: A | Discharge status: Home / Self Care | Payer: Medicare (Managed Care) | Source: Ambulatory Visit | Attending: Internal Medicine | Admitting: Internal Medicine

## 2021-12-24 DIAGNOSIS — R931 Abnormal findings on diagnostic imaging of heart and coronary circulation: Secondary | ICD-10-CM

## 2021-12-24 DIAGNOSIS — I2510 Atherosclerotic heart disease of native coronary artery without angina pectoris: Secondary | ICD-10-CM | POA: Insufficient documentation

## 2021-12-24 MED ORDER — NITROGLYCERIN 0.4 MG SL SUBL
0.8000 mg | SUBLINGUAL_TABLET | Freq: Once | SUBLINGUAL | Status: AC
  Administered 2021-12-24: 0.8 mg via SUBLINGUAL

## 2021-12-24 MED ORDER — IOHEXOL 350 MG/ML SOLN
100.0000 mL | Freq: Once | INTRAVENOUS | Status: AC | PRN
  Administered 2021-12-24: 100 mL via INTRAVENOUS

## 2021-12-24 MED ORDER — NITROGLYCERIN 0.4 MG SL SUBL
SUBLINGUAL_TABLET | SUBLINGUAL | Status: AC
  Filled 2021-12-24: qty 2

## 2022-01-07 ENCOUNTER — Telehealth: Payer: Self-pay | Admitting: Neurology

## 2022-01-07 NOTE — Telephone Encounter (Signed)
LVM and sent mychart msg informing pt of need to reschedule 11/21 appointment - MD out

## 2022-01-12 ENCOUNTER — Ambulatory Visit: Payer: Medicare (Managed Care) | Admitting: Neurology

## 2022-01-12 ENCOUNTER — Encounter (HOSPITAL_COMMUNITY): Payer: Medicare (Managed Care) | Admitting: Cardiology

## 2022-01-19 ENCOUNTER — Encounter (HOSPITAL_COMMUNITY): Payer: Self-pay | Admitting: Cardiology

## 2022-01-19 ENCOUNTER — Ambulatory Visit (HOSPITAL_COMMUNITY)
Admission: RE | Admit: 2022-01-19 | Discharge: 2022-01-19 | Disposition: A | Discharge status: Home / Self Care | Payer: Medicare (Managed Care) | Source: Ambulatory Visit | Attending: Cardiology | Admitting: Cardiology

## 2022-01-19 VITALS — BP 120/70 | HR 62 | Wt 183.6 lb

## 2022-01-19 DIAGNOSIS — Z7901 Long term (current) use of anticoagulants: Secondary | ICD-10-CM | POA: Insufficient documentation

## 2022-01-19 DIAGNOSIS — I480 Paroxysmal atrial fibrillation: Secondary | ICD-10-CM | POA: Insufficient documentation

## 2022-01-19 DIAGNOSIS — I2510 Atherosclerotic heart disease of native coronary artery without angina pectoris: Secondary | ICD-10-CM | POA: Diagnosis not present

## 2022-01-19 DIAGNOSIS — I272 Pulmonary hypertension, unspecified: Secondary | ICD-10-CM | POA: Diagnosis not present

## 2022-01-19 DIAGNOSIS — N186 End stage renal disease: Secondary | ICD-10-CM | POA: Diagnosis not present

## 2022-01-19 DIAGNOSIS — E785 Hyperlipidemia, unspecified: Secondary | ICD-10-CM | POA: Diagnosis not present

## 2022-01-19 DIAGNOSIS — Z794 Long term (current) use of insulin: Secondary | ICD-10-CM | POA: Diagnosis not present

## 2022-01-19 DIAGNOSIS — I5042 Chronic combined systolic (congestive) and diastolic (congestive) heart failure: Secondary | ICD-10-CM | POA: Diagnosis not present

## 2022-01-19 DIAGNOSIS — E1122 Type 2 diabetes mellitus with diabetic chronic kidney disease: Secondary | ICD-10-CM | POA: Diagnosis not present

## 2022-01-19 DIAGNOSIS — Z79899 Other long term (current) drug therapy: Secondary | ICD-10-CM | POA: Insufficient documentation

## 2022-01-19 DIAGNOSIS — I5022 Chronic systolic (congestive) heart failure: Secondary | ICD-10-CM | POA: Diagnosis not present

## 2022-01-19 DIAGNOSIS — I132 Hypertensive heart and chronic kidney disease with heart failure and with stage 5 chronic kidney disease, or end stage renal disease: Secondary | ICD-10-CM | POA: Insufficient documentation

## 2022-01-19 LAB — COMPREHENSIVE METABOLIC PANEL
ALT: 12 U/L (ref 0–44)
AST: 13 U/L — ABNORMAL LOW (ref 15–41)
Albumin: 3.1 g/dL — ABNORMAL LOW (ref 3.5–5.0)
Alkaline Phosphatase: 108 U/L (ref 38–126)
Anion gap: 12 (ref 5–15)
BUN: 27 mg/dL — ABNORMAL HIGH (ref 8–23)
CO2: 30 mmol/L (ref 22–32)
Calcium: 9.5 mg/dL (ref 8.9–10.3)
Chloride: 99 mmol/L (ref 98–111)
Creatinine, Ser: 6.69 mg/dL — ABNORMAL HIGH (ref 0.44–1.00)
GFR, Estimated: 6 mL/min — ABNORMAL LOW (ref >60)
Glucose, Bld: 151 mg/dL — ABNORMAL HIGH (ref 70–99)
Potassium: 4.3 mmol/L (ref 3.5–5.1)
Sodium: 141 mmol/L (ref 135–145)
Total Bilirubin: 0.6 mg/dL (ref 0.3–1.2)
Total Protein: 6.5 g/dL (ref 6.5–8.1)

## 2022-01-19 LAB — CBC
HCT: 32.8 % — ABNORMAL LOW (ref 36.0–46.0)
Hemoglobin: 10.2 g/dL — ABNORMAL LOW (ref 12.0–15.0)
MCH: 30.6 pg (ref 26.0–34.0)
MCHC: 31.1 g/dL (ref 30.0–36.0)
MCV: 98.5 fL (ref 80.0–100.0)
Platelets: 227 10*3/uL (ref 150–400)
RBC: 3.33 MIL/uL — ABNORMAL LOW (ref 3.87–5.11)
RDW: 15.4 % (ref 11.5–15.5)
WBC: 8 10*3/uL (ref 4.0–10.5)
nRBC: 0 % (ref 0.0–0.2)

## 2022-01-19 LAB — TSH: TSH: 1.062 u[IU]/mL (ref 0.350–4.500)

## 2022-01-19 LAB — LIPID PANEL
Cholesterol: 111 mg/dL (ref 0–200)
HDL: 49 mg/dL (ref >40)
LDL Cholesterol: 41 mg/dL (ref 0–99)
Total CHOL/HDL Ratio: 2.3 RATIO
Triglycerides: 107 mg/dL (ref <150)
VLDL: 21 mg/dL (ref 0–40)

## 2022-01-19 NOTE — Patient Instructions (Signed)
Good to see you today!    No medication changes  Labs done today, your results will be available in MyChart, we will contact you for abnormal readings.  Your physician recommends that you schedule a follow-up appointment in: 6 months(May 2024) Call office in February 2024 to schedule an appointment  If you have any questions or concerns before your next appointment please send [place] a message through [place] or call our office at [phone].    TO LEAVE A MESSAGE FOR THE NURSE SELECT OPTION 2, PLEASE LEAVE A MESSAGE INCLUDING: YOUR NAME DATE OF BIRTH CALL BACK NUMBER REASON FOR CALL**this is important as we prioritize the call backs  YOU WILL RECEIVE A CALL BACK THE SAME DAY AS LONG AS YOU CALL BEFORE 4:00 PM  At the [facility], you and your health needs are our priority. As part of our continuing mission to provide you with exceptional heart care, we have created designated Provider Care Teams. These Care Teams include your primary Cardiologist (physician) and Advanced Practice Providers (APPs- Physician Assistants and Nurse Practitioners) who all work together to provide you with the care you need, when you need it.   You may see any of the following providers on your designated Care Team at your next follow up: Dr Glori Bickers Dr Loralie Champagne Dr. Roxana Hires, NP Lyda Jester, [place] [facility] [place], [place] Forestine Na, NP Audry Riles, PharmD   Please be sure to bring in all your medications bottles to every appointment.

## 2022-01-19 NOTE — Progress Notes (Signed)
Advanced Heart Failure Clinic Note  PCP: [facility] [place] [facility] Cardiology: Dr. Aundra Dubin Nephrology: Dr Hollie Salk.   81 y.o.with history of ESRD, paroxysmal atrial fibrillation, and chronic diastolic CHF.  RHC/LHC was done in 10/15.  This showed markedly elevated filling pressures and pulmonary venous hypertension.  There was no significant CAD.  Last echo in 2/18 showed EF 50-55%, mild LVH, grade 2 diastolic dysfunction.   Admitted 10/2-10/6/19 with SOB. RHC showed elevated filling pressures and preserved CO. She was diuresed with IV lasix, then transitioned to torsemide 80 mg am, 40 mg pm. Repeat echo showed EF 45-50% with normal RV. PYP scan was negative for TTR amyloid. PFTs showed restrictive disease. CT chest showed no ILD but did show right hydronephrosis. Renal US showed single kidney with mild to moderate hydronephrosis on right (unchanged since 2011).  DC weight 208 lbs.    In 12/20, she progressed to ESRD and was started on HD.    Echo in 1/21 showed EF 55-60% with normal.  RHC/LHC in 4/21 showed no obstructive CAD, normal filling pressures, mild pulmonary hypertension.  She had atrial fibrillation in 9/21 and underwent DCCV to NSR.   4/22 was seen in ED for acute dyspnea. Found to be in rapid Afib. This was in setting of recent consumption of high caffeine energy drink. She was given IV Cardizem and converted back to NSR and was released same day.  Admitted 12/22 with atrial fibrillation with RVR. She was cardioverted in ED to SR with PACs. She was started on amio and Toprol. Unfortunately she was found unresponsive, intubated and transferred to ICU on NE. Code stroke activated. MRI brain without acute process. 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  She required CVVH but was able to transition to [facility] after extubation. Echo showed EF 30-35% range, worse function in septum; mild RV dysfunction, IVC  dilated. She underwent LHC showing no obstructive CAD. 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 She went back into afib requiring IV amiodarone and had successful DCCV 03/03/21.  At post hospital visit on 02/28/21, back in A fib. On 03/12/21 she underwent successful cardioversion. Echo 4/23 showed EF 55-60%, mild LVH, normal RV.   Coronary CTA in 11/23 to assess anomalous vessels showed a single large left coronary artery and nondominant RCA, no hemodynamically significant CAD by CT FFR.  Today she returns for HF follow up with her daughter. She is in NSR today.  No palpitations.  No BRBPR/melena. No lightheadedness. She is able to walk in her house, uses walker for stability.  No dyspnea walking in house.  No chest pain.    ECG (personally reviewed): NSR, IVCD 130 msec, 1st degree AVB 218 msec  Labs (2/18): K 4, creatinine 1.79 Labs (10/19): K 4.5, creatinine 2.4 Labs (12/19): LDL 62 Labs (1/20): K 3.9, creatinine 2.72 Labs (12/20): hgb 11.9 Labs (9/21): LDL 64, HDL 53 Labs (4/21): SCr 4.19, K 3.8  Labs (1/23): K 3.6, creatinine 4.89, hgb 10.5 Labs (3/23): LDL 52 Labs (4/23): K 4.3, creatinine 5.60, LFTs normal, TSH normal  PMH: 1. Atrial fibrillation: Paroxysmal.  2. HTN 3. 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  5. ESRD 6. H/o IVCD 7. COPD: PFTs (10/19) actually showed severe restriction, concern for interstitial process.  CT chest (10/19) showed mild-moderate patchy air trapping but no evidence for interstitial lung  disease.  8. Gout 9. Chronic primarily diastolic CHF: Echo (8/78) with EF 50-55%, mild LVH, grade 2 diastolic dysfunction.  - LHC/RHC (10/15): small distal LAD but no discrete stenosis.  Mean RA 15, PA 55/20 mean 34, mean PCWP 31 => pulmonary venous hypertension.  - PYP scan (10/19): No evidence for TTR amyloidosis.  - Echo (10/19):  EF 45-50%, mild LVH, normal RV size and systolic function.  - RHC (10/19): mean RA 13, PA 74/27 mean 50, mean PCWP 28, CI 3.12, PVR 3.6 WU. Pulmonary venous hypertension.  - Echo (1/21): EF 55-60%, normal RV.  - LHC/RHC (4/21): super-dominant LCx with small LAD and RCA, no CAD; mean RA 5, PA 47/15, mean PCWP 10, CI 3.61, PVR 2.5 WU.  - Echo (1/23): EF 30-35% range, worse function in septum; mild RV dysfunction, IVC dilated.  - LHC (1/23): no obstructive CAD. Unusual coronary tree with a single large LCA and a small, nondominant RCA.   - Echo (4/23): EF 55-60%, mild LVH, normal RV.  - Coronary CTA in 11/23 to assess anomalous vessels showed a single large left coronary artery and nondominant RCA, no hemodynamically significant CAD by CT FFR. 10. Negative sleep study in 6/21.   Review of systems complete and found to be negative unless listed in HPI.    Social History   Socioeconomic History   Marital status: Widowed    Spouse name: Not on file   Number of children: 2   Years of education: Not on file   Highest education level: Not on file  Occupational History   Occupation: retired  Tobacco Use   Smoking status: Former    Packs/day: 1.00    Years: 20.00    Total pack years: 20.00    Types: Cigarettes    Quit date: 02/22/2009    Years since quitting: 12.9   Smokeless tobacco: Never  Vaping Use   Vaping Use: Never used  Substance and Sexual Activity   Alcohol use: Yes    Alcohol/week: 0.0 standard drinks of alcohol    Comment: occasional beer   Drug use: Not Currently    Comment: marijuana in the past   Sexual activity: Never  Other Topics Concern   Not on file  Social History Narrative   Not on file   Social Determinants of Health   Financial Resource Strain: Low Risk  (02/27/2021)   Overall Financial Resource Strain (CARDIA)    Difficulty of Paying Living Expenses: Not very hard  Food Insecurity: No Food Insecurity (02/27/2021)   Hunger Vital Sign    Worried About Running  Out of Food in the Last Year: Never true    Ran Out of Food in the Last Year: Never true  Transportation Needs: No Transportation Needs (02/27/2021)   PRAPARE - Hydrologist (Medical): No    Lack of Transportation (Non-Medical): No  Physical Activity: Not on file  Stress: Not on file  Social Connections: Not on file  Intimate Partner Violence: Not on file   Family History  Problem Relation Age of Onset   Lupus Daughter    Cancer Mother 67       type unknown   CVA Father 48   Cancer Brother 38       type unknown   Review of systems complete and found to be negative unless listed in HPI.    Current Outpatient Medications  Medication Sig Dispense Refill   acetaminophen (TYLENOL) 500 MG tablet Take 1,000 mg  by mouth daily.     albuterol (PROVENTIL HFA;VENTOLIN HFA) 108 (90 BASE) MCG/ACT inhaler Inhale 2 puffs into the lungs in the morning and at bedtime.     amiodarone (PACERONE) 200 MG tablet Take 200 mg by mouth daily.     atorvastatin (LIPITOR) 80 MG tablet Take 80 mg by mouth at bedtime.     budesonide-formoterol (SYMBICORT) 160-4.5 MCG/ACT inhaler Inhale 2 puffs into the lungs 2 (two) times daily. 1 each 0   diphenhydrAMINE (BENADRYL) 25 mg capsule Take 25 mg by mouth every 6 (six) hours as needed for itching.     ELIQUIS 5 MG TABS tablet Take 1 tablet by mouth twice daily 180 tablet 3   insulin glargine (SEMGLEE) 100 UNIT/ML injection Inject 16 Units into the skin daily.     Insulin Pen Needle 32G X 4 MM MISC 1 Device by Does not apply route in the morning, at noon, in the evening, and at bedtime. 400 each 3   ketoconazole (NIZORAL) 2 % cream Apply 1 application topically daily.     latanoprost (XALATAN) 0.005 % ophthalmic solution Place 1 drop into both eyes at bedtime.     lidocaine (LINDAMANTLE) 3 % CREA cream Apply 1 application topically every Monday, Wednesday, and Friday. Prior to dialysis     Lidocaine 4 % PTCH Place 1 patch onto the skin daily.      Menthol, Topical Analgesic, (BIOFREEZE EX) Apply 1 application  topically as needed.     midodrine (PROAMATINE) 10 MG tablet Take 10 mg by mouth 3 (three) times daily. Hold if SBP>130     pantoprazole (PROTONIX) 40 MG tablet Take 40 mg by mouth at bedtime.     Pramoxine-Calamine (AVEENO ANTI-ITCH EX) Apply 1 application topically every 6 (six) hours as needed (itching).     traZODone (DESYREL) 50 MG tablet Take 50 mg by mouth at bedtime.     No current facility-administered medications for this encounter.   BP 120/70   Pulse 62   Wt 83.3 kg (183 lb 9.6 oz)   SpO2 97%   BMI 32.52 kg/m   Wt Readings from Last 3 Encounters:  01/19/22 83.3 kg (183 lb 9.6 oz)  10/20/21 84.1 kg (185 lb 6.4 oz)  07/13/21 84.8 kg (186 lb 15.2 oz)   General: NAD Neck: JVP 8 cm, no thyromegaly or thyroid nodule.  Lungs: Clear to auscultation bilaterally with normal respiratory effort. CV: Nondisplaced PMI.  Heart regular S1/S2, no S3/S4, no murmur.  Trace ankle edema.  No carotid bruit.  Normal pedal pulses.  Abdomen: Soft, nontender, no hepatosplenomegaly, no distention.  Skin: Intact without lesions or rashes.  Neurologic: Alert and oriented x 3.  Psych: Normal affect. Extremities: No clubbing or cyanosis.  HEENT: Normal.   Assessment/Plan: 1.  Atrial fibrillation: Paroxysmal AF.  Seen in ED at [facility] 12/22 for AF and spontaneously converted to [place].  Presented 02/17/21 with AF with RVR. Cardioverted in ER.  She was in junctional rhythm after respiratory arrest and intubation with rate upper 40s-50s. Rhythm on 02/24/21  appeared to be wandering atrial pacemaker, and amiodarone stopped.  Reverted back to AF with RVR and amiodarone restarted. S/p DCCV 03/03/21 and 03/12/21.  She appears to have remained in NSR since then, NSR today.  - Continue amiodarone 200 mg daily.  Check LFTs and TSH.  She will need a regular eye exam.   - Continue Eliquis 5 mg bid. CBC today. .  2. Chronic systolic CHF: Echo  in 2/18 with EF 50-55%, mild LVH, grade 2 diastolic dysfunction.  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  [place] 11/23/17 showed significantly elevated right and left heart filling pressures and severe primarily pulmonary venous hypertension. Cardiac output preserved. Echo (10/19) with EF 45-50%, septal-lateral dyssynchrony, RV looked ok.  PYP scan was not suggestive of transthyretin amyloidosis.  Echo in 1/21 with EF 55-60%, normal RV.  RHC/LHC in 4/21 with no significant CAD, normal filling pressures and cardiac output.  Echo 1/23 showed EF down to 35%.  HS-TnI 385 => 378, likely demand ischemia with hypotension/respiratory arrest.  LHC showed no obstructive CAD, Unusual coronary tree with a single large left coronary and nondominant RCA, anatomy confirmed by coronary CTA in 11/23.  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  Chronic NYHA class III symptoms, likely deconditioning plays a large role.  - Continue midodrine 10 mg tid, no BP room for GDMT.   3. ESRD: Tolerating iHD.  - Continue midodrine 10 mg tid.  4. Pulmonary HTN: Mild PH on 4/21 RHC.  5. Hyperlipidemia: Continue statin.  - Check lipids today.  6. DM II: She is on insulin. 7. CAD: Coronary CTA in 11/23 with no hemodynamically significant CAD by FFR.  - Continue statin as above.   Followup 6 months with APP  Loralie Champagne 01/19/2022

## 2022-03-02 ENCOUNTER — Ambulatory Visit: Payer: Medicare (Managed Care) | Admitting: Neurology

## 2022-03-30 ENCOUNTER — Ambulatory Visit: Payer: Medicare (Managed Care) | Admitting: Neurology

## 2022-04-13 ENCOUNTER — Emergency Department (HOSPITAL_COMMUNITY)
Admission: EM | Admit: 2022-04-13 | Discharge: 2022-04-14 | Disposition: A | Discharge status: Home / Self Care | Payer: Medicare (Managed Care) | Attending: Emergency Medicine | Admitting: Emergency Medicine

## 2022-04-13 ENCOUNTER — Encounter (HOSPITAL_COMMUNITY): Payer: Self-pay | Admitting: Emergency Medicine

## 2022-04-13 ENCOUNTER — Other Ambulatory Visit: Payer: Self-pay

## 2022-04-13 ENCOUNTER — Emergency Department (HOSPITAL_COMMUNITY): Payer: Medicare (Managed Care)

## 2022-04-13 DIAGNOSIS — E1122 Type 2 diabetes mellitus with diabetic chronic kidney disease: Secondary | ICD-10-CM | POA: Insufficient documentation

## 2022-04-13 DIAGNOSIS — Z992 Dependence on renal dialysis: Secondary | ICD-10-CM | POA: Diagnosis not present

## 2022-04-13 DIAGNOSIS — R600 Localized edema: Secondary | ICD-10-CM | POA: Diagnosis not present

## 2022-04-13 DIAGNOSIS — Z79899 Other long term (current) drug therapy: Secondary | ICD-10-CM | POA: Insufficient documentation

## 2022-04-13 DIAGNOSIS — I132 Hypertensive heart and chronic kidney disease with heart failure and with stage 5 chronic kidney disease, or end stage renal disease: Secondary | ICD-10-CM | POA: Diagnosis not present

## 2022-04-13 DIAGNOSIS — R609 Edema, unspecified: Secondary | ICD-10-CM

## 2022-04-13 DIAGNOSIS — Z794 Long term (current) use of insulin: Secondary | ICD-10-CM | POA: Insufficient documentation

## 2022-04-13 DIAGNOSIS — Z7901 Long term (current) use of anticoagulants: Secondary | ICD-10-CM | POA: Insufficient documentation

## 2022-04-13 DIAGNOSIS — M7989 Other specified soft tissue disorders: Secondary | ICD-10-CM | POA: Diagnosis present

## 2022-04-13 DIAGNOSIS — N186 End stage renal disease: Secondary | ICD-10-CM | POA: Diagnosis not present

## 2022-04-13 DIAGNOSIS — I5030 Unspecified diastolic (congestive) heart failure: Secondary | ICD-10-CM | POA: Insufficient documentation

## 2022-04-13 LAB — COMPREHENSIVE METABOLIC PANEL
ALT: 15 U/L (ref 0–44)
AST: 15 U/L (ref 15–41)
Albumin: 3 g/dL — ABNORMAL LOW (ref 3.5–5.0)
Alkaline Phosphatase: 125 U/L (ref 38–126)
Anion gap: 11 (ref 5–15)
BUN: 20 mg/dL (ref 8–23)
CO2: 32 mmol/L (ref 22–32)
Calcium: 9.1 mg/dL (ref 8.9–10.3)
Chloride: 97 mmol/L — ABNORMAL LOW (ref 98–111)
Creatinine, Ser: 5.84 mg/dL — ABNORMAL HIGH (ref 0.44–1.00)
GFR, Estimated: 7 mL/min — ABNORMAL LOW (ref >60)
Glucose, Bld: 224 mg/dL — ABNORMAL HIGH (ref 70–99)
Potassium: 4.1 mmol/L (ref 3.5–5.1)
Sodium: 140 mmol/L (ref 135–145)
Total Bilirubin: 0.4 mg/dL (ref 0.3–1.2)
Total Protein: 7.2 g/dL (ref 6.5–8.1)

## 2022-04-13 LAB — CBC WITH DIFFERENTIAL/PLATELET
Abs Immature Granulocytes: 0.03 10*3/uL (ref 0.00–0.07)
Basophils Absolute: 0.1 10*3/uL (ref 0.0–0.1)
Basophils Relative: 1 %
Eosinophils Absolute: 0.1 10*3/uL (ref 0.0–0.5)
Eosinophils Relative: 1 %
HCT: 37.4 % (ref 36.0–46.0)
Hemoglobin: 12 g/dL (ref 12.0–15.0)
Immature Granulocytes: 0 %
Lymphocytes Relative: 13 %
Lymphs Abs: 1.4 10*3/uL (ref 0.7–4.0)
MCH: 30.6 pg (ref 26.0–34.0)
MCHC: 32.1 g/dL (ref 30.0–36.0)
MCV: 95.4 fL (ref 80.0–100.0)
Monocytes Absolute: 0.9 10*3/uL (ref 0.1–1.0)
Monocytes Relative: 9 %
Neutro Abs: 7.9 10*3/uL — ABNORMAL HIGH (ref 1.7–7.7)
Neutrophils Relative %: 76 %
Platelets: 237 10*3/uL (ref 150–400)
RBC: 3.92 MIL/uL (ref 3.87–5.11)
RDW: 14.5 % (ref 11.5–15.5)
WBC: 10.3 10*3/uL (ref 4.0–10.5)
nRBC: 0 % (ref 0.0–0.2)

## 2022-04-13 LAB — BRAIN NATRIURETIC PEPTIDE: B Natriuretic Peptide: 973.4 pg/mL — ABNORMAL HIGH (ref 0.0–100.0)

## 2022-04-13 NOTE — ED Triage Notes (Signed)
Pt BIB EMS from an independent living facility with BLE edema and left knee since this week.   Pt is a dialysis pt, last tx was yesterday. States that she has been compliant with treatments. Tx MWF.

## 2022-04-13 NOTE — ED Provider Triage Note (Signed)
Emergency Medicine Provider Triage Evaluation Note  Stephanie Woodward , a 82 y.o. female  was evaluated in triage.  Pt complains of bilateral lower extremity edema.  Patient with gradually worsening lower extremity edema x 1 week. Right leg swelling slightly worse than left.  Patient reports full compliance with previously prescribed medications.  Patient is on Eliquis twice daily.  She did not take her evening dose yet today.  She reports full compliance with Eliquis.  Patient with ESRD and is on HD.  Last dialysis session was yesterday.  No difficulties reported.  Patient is followed by [facility].  Review of Systems  Positive: Lower extremity edema Negative: Chest pain, shortness of breath, fever, weakness  Physical Exam  BP (!) 143/50   Pulse 72   Temp 98.6 F (37 C)   Resp 18   Ht 5' 3"$  (1.6 m)   Wt 83 kg   SpO2 99%   BMI 32.42 kg/m  Gen:   Awake, no distress   Resp:  Normal effort  MSK:   Moves extremities without difficulty  Other:  Moderate bilateral 2-3+ lower extremity edema  Medical Decision Making  Medically screening exam initiated at 7:28 PM.  Appropriate orders placed.  Stephanie Woodward was informed that the remainder of the evaluation will be completed by another provider, this initial triage assessment does not replace that evaluation, and the importance of remaining in the ED until their evaluation is complete.  Patient advised at this triage assessment is beginning of her ED evaluation.  Final assessment and disposition is pending.   Valarie Merino, MD 04/13/22 [phone]

## 2022-04-13 NOTE — ED Notes (Signed)
Ded[place] On call RN with [facility] [phone] For questions please call!

## 2022-04-14 NOTE — ED Notes (Signed)
Ptar called 

## 2022-04-14 NOTE — Discharge Instructions (Signed)
You have extra fluid on you, please go to dialysis today, likely you will need an extra treatment, I spoke with your nephrologist about this, please ensure that you also speak with them as well.  Come back to the emergency department if you develop chest pain, shortness of breath, severe abdominal pain, uncontrolled nausea, vomiting, diarrhea.

## 2022-04-14 NOTE — ED Provider Notes (Signed)
[facility] Provider Note   CSN: [id number] Arrival date & time: 04/13/22  1920     History  Chief Complaint  Patient presents with   Leg Swelling    Stephanie Woodward is a 82 y.o. female.  HPI   Patient with medical history including hypertension, diabetes, A-fib currently on Eliquis, end-stage renal disease on dialysis Monday Wednesday Friday, diastolic heart failure presents with complaints of worsening leg swelling.  Patient has been going on for the last week, states she was worsening swelling both of her legs, she states she will have some pain with ambulation, she denies any worsening shortness of breath chest pain no pleuritic pain, she states she has not missed any doses of her Eliquis.  States that she went to her dialysis on Monday, she is about to go again today, states that she did miss a dialysis treatment about 1 to 2 weeks ago due to it being closed.  She has no fever chills cough congestion general body aches.    Home Medications Prior to Admission medications   Medication Sig Start Date End Date Taking? Authorizing Provider  acetaminophen (TYLENOL) 500 MG tablet Take 1,000 mg by mouth daily.    [provider]  albuterol (PROVENTIL HFA;VENTOLIN HFA) 108 (90 BASE) MCG/ACT inhaler Inhale 2 puffs into the lungs in the morning and at bedtime.    [provider]  amiodarone (PACERONE) 200 MG tablet Take 200 mg by mouth daily.    [provider]  atorvastatin (LIPITOR) 80 MG tablet Take 80 mg by mouth at bedtime.    [provider]  budesonide-formoterol (SYMBICORT) 160-4.5 MCG/ACT inhaler Inhale 2 puffs into the lungs 2 (two) times daily. 04/09/20   Collene Gobble, MD  diphenhydrAMINE (BENADRYL) 25 mg capsule Take 25 mg by mouth every 6 (six) hours as needed for itching.    [provider]  ELIQUIS 5 MG TABS tablet Take 1 tablet by mouth twice daily 07/23/20   Larey Dresser, MD   insulin glargine (SEMGLEE) 100 UNIT/ML injection Inject 16 Units into the skin daily.    [provider]  Insulin Pen Needle 32G X 4 MM MISC 1 Device by Does not apply route in the morning, at noon, in the evening, and at bedtime. 02/06/20   Shamleffer, Melanie Crazier, MD  ketoconazole (NIZORAL) 2 % cream Apply 1 application topically daily. 02/16/21   [provider]  latanoprost (XALATAN) 0.005 % ophthalmic solution Place 1 drop into both eyes at bedtime.    [provider]  lidocaine (LINDAMANTLE) 3 % CREA cream Apply 1 application topically every Monday, Wednesday, and Friday. Prior to dialysis 03/06/20   [provider]  Lidocaine 4 % PTCH Place 1 patch onto the skin daily. 11/12/20   [provider]  Menthol, Topical Analgesic, (BIOFREEZE EX) Apply 1 application  topically as needed.    [provider]  midodrine (PROAMATINE) 10 MG tablet Take 10 mg by mouth 3 (three) times daily. Hold if SBP>130    [provider]  pantoprazole (PROTONIX) 40 MG tablet Take 40 mg by mouth at bedtime. 10/26/18   [provider]  Pramoxine-Calamine (AVEENO ANTI-ITCH EX) Apply 1 application topically every 6 (six) hours as needed (itching).    [provider]  traZODone (DESYREL) 50 MG tablet Take 50 mg by mouth at bedtime.    [provider]      Allergies    Eggs or egg-derived  products, Lisinopril, Penicillins, and Other    Review of Systems   Review of Systems  Constitutional:  Negative for chills and fever.  Respiratory:  Negative for shortness of breath.   Cardiovascular:  Positive for leg swelling. Negative for chest pain.  Gastrointestinal:  Negative for abdominal pain.  Neurological:  Negative for headaches.    Physical Exam Updated Vital Signs BP (!) 160/54 (BP Location: Right Arm)   Pulse 62   Temp 98.4 F (36.9 C) (Oral)   Resp 20   Ht 5' 3"$  (1.6 m)   Wt 83 kg   SpO2 100%   BMI 32.42 kg/m   Physical Exam Vitals and nursing note reviewed.  Constitutional:      General: She is not in acute distress.    Appearance: She is not ill-appearing.  HENT:     Head: Normocephalic and atraumatic.     Nose: No congestion.  Eyes:     Conjunctiva/sclera: Conjunctivae normal.  Cardiovascular:     Rate and Rhythm: Normal rate and regular rhythm.     Pulses: Normal pulses.     Heart sounds: No murmur heard.    No friction rub. No gallop.  Pulmonary:     Effort: No respiratory distress.     Breath sounds: No wheezing, rhonchi or rales.     Comments: No evidence of respiratory distress nontachypneic nonhypoxic, no Rales present on my exam. Musculoskeletal:     Comments: Patient is doing 1+ pitting edema up to the shins knees bilaterally, no lymphedema skin changes no evidence of infection, no evidence of limb ischemia, she has 2+ dorsal pedal pulses bilaterally.  Skin:    General: Skin is warm and dry.  Neurological:     Mental Status: She is alert.  Psychiatric:        Mood and Affect: Mood normal.     ED Results / Procedures / Treatments   Labs (all labs ordered are listed, but only abnormal results are displayed) Labs Reviewed  CBC WITH DIFFERENTIAL/PLATELET - Abnormal; Notable for the following components:      Result Value   Neutro Abs 7.9 (*)    All other components within normal limits  COMPREHENSIVE METABOLIC PANEL - Abnormal; Notable for the following components:   Chloride 97 (*)    Glucose, Bld 224 (*)    Creatinine, Ser 5.84 (*)    Albumin 3.0 (*)    GFR, Estimated 7 (*)    All other components within normal limits  BRAIN NATRIURETIC PEPTIDE - Abnormal; Notable for the following components:   B Natriuretic Peptide 973.4 (*)    All other components within normal limits    EKG EKG Interpretation  Date/Time:  Tuesday April 13 2022 19:39:24 EST Ventricular Rate:  68 PR Interval:  210 QRS Duration: 139 QT Interval:  470 QTC Calculation: 500 R  Axis:   248 Text Interpretation: Sinus rhythm Right bundle branch block Inferior infarct, old Anterior infarct, old No significant change since last tracing Confirmed by Orpah Greek [id number]) on 04/14/2022 2:44:26 AM  Radiology DG Chest 2 View  Result Date: 04/13/2022 CLINICAL DATA:  Bilateral lower extremity edema, shortness of breath EXAM: CHEST - 2 VIEW COMPARISON:  05/11/2021 FINDINGS: Frontal and lateral views of the chest demonstrate an enlarged cardiac silhouette. Diffuse increased interstitial prominence with bibasilar ground-glass airspace disease and small right pleural effusion. Findings favor volume overload. No pneumothorax. No acute bony abnormalities. IMPRESSION: 1. Findings consistent with mild volume overload and developing pulmonary  edema. Electronically Signed   By: Randa Ngo M.D.   On: 04/13/2022 20:21    Procedures Procedures    Medications Ordered in ED Medications - No data to display  ED Course/ Medical Decision Making/ A&P                             Medical Decision Making  This patient presents to the ED for concern of leg swelling, this involves an extensive number of treatment options, and is a complaint that carries with it a high risk of complications and morbidity.  The differential diagnosis includes CHF exacerbation, metabolic derailment, ACS, pneumonia    Additional history obtained:  Additional history obtained from daughter at bedside  External records from outside source obtained and reviewed including cardiology notes   Co morbidities that complicate the patient evaluation  End-stage renal disease, CHF, on anticoagulants  Social Determinants of Health:  Geriatric    Lab Tests:  I Ordered, and personally interpreted labs.  The pertinent results include: CBC is unremarkable, CMP shows glucose of 224 creatinine 5, GFR 7, BNP is 973   Imaging Studies ordered:  I ordered imaging studies including chest x-ray I  independently visualized and interpreted imaging which showed shows mild pulmonary congestion I agree with the radiologist interpretation   Cardiac Monitoring:  The patient was maintained on a cardiac monitor.  I personally viewed and interpreted the cardiac monitored which showed an underlying rhythm of:  without signs of ischemia   Medicines ordered and prescription drug management:  I ordered medication including N/A I have reviewed the patients home medicines and have made adjustments as needed  Critical Interventions:  N/A   Reevaluation:  Presents with concerns of bilateral leg swelling, triage obtain basic lab work imaging which I personally reviewed, on my exam she does have some peripheral edema noted, but no evidence of respiratory distress, will consult with nephrology for further recommendations.  Updated the patient on these recommendation agreed this plan will discharge.    Consultations Obtained:  I requested consultation with the Dr. Hollie Salk,  and discussed lab and imaging findings as well as pertinent plan - they recommend: Patient can be sent home since she has no new oxygen requirements, no emergent electrolyte remnants requirements and get her normal dialysis treatment today she would likely need an extra session.   Test Considered:  N/A   Rule out Suspicion for ACS is low at this time she does not endorse any chest pain shortness of breath, EKG without signs of ischemia.  I doubt PE as she has no new oxygen requirement she is nontachypneic nonhypoxic, she has been compliant with her anticoagulants.  Doubt patient will need emergent hemodialysis she has new oxygen requirements no electrolyte derailment.  She does appear to be slightly volume overloaded and this can likely be corrected with her outpatient dialysis.  I doubt DVT as she has no unilateral leg swelling no calf tenderness, presentation seems more consistent with volume overload.    Dispostion and  problem list  After consideration of the diagnostic results and the patients response to treatment, I feel that the patent would benefit from discharge.  Volume overloaded-likely secondary due to missed dialysis, will have her go to her current dialysis treatment today, likely she will need a second one.             Final Clinical Impression(s) / ED Diagnoses Final diagnoses:  Peripheral edema  Rx / DC Orders ED Discharge Orders     None         Aron Baba 04/14/22 0321    Orpah Greek, MD 04/15/22 [phone]

## 2022-04-15 ENCOUNTER — Ambulatory Visit (INDEPENDENT_AMBULATORY_CARE_PROVIDER_SITE_OTHER): Payer: Medicare (Managed Care) | Admitting: Neurology

## 2022-04-15 ENCOUNTER — Encounter: Payer: Self-pay | Admitting: Neurology

## 2022-04-15 VITALS — BP 168/62 | HR 69 | Ht 62.0 in | Wt 183.0 lb

## 2022-04-15 DIAGNOSIS — G3184 Mild cognitive impairment, so stated: Secondary | ICD-10-CM

## 2022-04-15 DIAGNOSIS — G250 Essential tremor: Secondary | ICD-10-CM

## 2022-04-15 MED ORDER — PROPRANOLOL HCL 10 MG PO TABS: 10.0000 mg | ORAL_TABLET | Freq: Two times a day (BID) | ORAL | 0 refills | Status: DC

## 2022-04-15 NOTE — Patient Instructions (Addendum)
Start propanolol 10 mg twice daily for management of the essential tremors  Will obtain Dementia labs including B12 and ATN profile  Continue your other medications Return in 6 months

## 2022-04-15 NOTE — Progress Notes (Signed)
[facility]  PATIENT: Stephanie Woodward DOB: 09-15-1940  REQUESTING CLINICIAN: [facility] [place]: Patient and daughter  REASON FOR VISIT: Memory deficit    HISTORICAL  CHIEF COMPLAINT:  Chief Complaint  Patient presents with   Follow-up    Rm 12. Patient with daughter. Patient reports seeing dogs and cats coming from under the bed. Daughter states they do not have any pets. This is the first hallucination daughter has noted. Daughter also reports more short term memory changes.     INTERVAL HISTORY 04/15/2022:  Patient presents today for follow-up, she is accompanied by her daughter.  She is still going to dialysis 3 days a week and [facility] once a week.  Patient feels that her memory is stable.  Most days after dialysis she is very drained and and seem to be more confused but otherwise she is fine.  Daughter reported her short-term memory is getting worse, she is more forgetful.  And today for the first time patient shared with daughter that she saw a cats and dogs underneath her bed.  She does not have any pets.  She does have home health care that comes 5 days a week for at least 5 hours.  Due to physical limitations she needs help with bathing and putting her clothes on but she can still pick up clothes she wants to wear, she can fix a sandwich, and daughter stated that her conversation usually are normal, she might have forgetfulness but otherwise she can keep up with conversation.     HISTORY OF PRESENT ILLNESS:  This is a 82 year old woman with past medical history of atrial fibrillation, on Eliquis, CHF, hypertension, hyperlipidemia, end-stage renal disease on dialysis Monday Wednesday Friday who is presenting for memory problem. Patient reports memory problems started after her hospitalization in December.  She went to the hospital in December for A-fib, hospitalization was complicated by respiratory distress requiring ventilation, admission to ICU  and rehab for about 3 to 4 months.  While in rehab she was on tramadol for pain and was confused, having hallucination and these symptoms subsided once the tramadol was discontinued.  While in Rehab also, she got infected with COVID.   Since April 22 she has been living with her daughter and since then family has reported some improvement in the memory but is still not back to baseline.  She still getting confused going to familiar places, and her main problem is forgetting what she was doing.  She gives the example of she can be in the kitchen, then decided to go to the refrigerator and when she opens the refrigerator she does not know why she opened her refrigerator and she will go back to the kitchen and forget what she was doing there. Seems like there is a problem with attention.    She still working with [facility] 2 days a week, getting physical therapy and Occupational Therapy and family is also trying to get speech.  She has a primary care doctor has a Education officer, museum and still getting dialysis 3 days a week.  While in rehab she had falls, CT has been negative for any intracranial abnormality.  Since being home no report of falls.    Her main goal is to go back to her independent living facility.    TBI:   No past history of TBI but had recent falls  Stroke:   no past history of stroke Seizures:   no past history of seizures Sleep:  no history of sleep apnea.   Mood:   patient denies anxiety and depression but reports low mood  Functional status: Dependent in all some ADLs Patient lives with daughter   Cooking: no  Cleaning: sometimes Shopping: no Bathing: needs help Toileting: needs help Driving: no Bills: no  Ever left the stove on by accident?: currently not cooking Forget how to use items around the house?: no Getting lost going to familiar places?: yes Forgetting loved ones names?: no Word finding difficulty? no Sleep: ok   OTHER MEDICAL CONDITIONS: Atrial fibrillation, CHF,  Hypertension, hyperlipidemia, DMII, ESRD on Dialysis MWF, back pain, glaucoma   REVIEW OF SYSTEMS: Full 14 system review of systems performed and negative with exception of: as noted in the HPI  ALLERGIES: Allergies  Allergen Reactions   Eggs Or Egg-Derived Products Nausea And Vomiting   Lisinopril Nausea And Vomiting   Penicillins Nausea And Vomiting    Has patient had a PCN reaction causing immediate rash, facial/tongue/throat swelling, SOB or lightheadedness with hypotension: No Has patient had a PCN reaction causing severe rash involving mucus membranes or skin necrosis: No Has patient had a PCN reaction that required hospitalization: No Has patient had a PCN reaction occurring within the last 10 years: No If all of the above answers are "NO", then may proceed with Cephalosporin use.    Other Other (See Comments)    HOME MEDICATIONS: Outpatient Medications Prior to Visit  Medication Sig Dispense Refill   acetaminophen (TYLENOL) 500 MG tablet Take 1,000 mg by mouth daily.     albuterol (PROVENTIL HFA;VENTOLIN HFA) 108 (90 BASE) MCG/ACT inhaler Inhale 2 puffs into the lungs in the morning and at bedtime.     amiodarone (PACERONE) 200 MG tablet Take 200 mg by mouth daily.     atorvastatin (LIPITOR) 80 MG tablet Take 80 mg by mouth at bedtime.     budesonide-formoterol (SYMBICORT) 160-4.5 MCG/ACT inhaler Inhale 2 puffs into the lungs 2 (two) times daily. 1 each 0   diphenhydrAMINE (BENADRYL) 25 mg capsule Take 25 mg by mouth every 6 (six) hours as needed for itching.     ELIQUIS 5 MG TABS tablet Take 1 tablet by mouth twice daily 180 tablet 3   insulin glargine (SEMGLEE) 100 UNIT/ML injection Inject 16 Units into the skin daily.     Insulin Pen Needle 32G X 4 MM MISC 1 Device by Does not apply route in the morning, at noon, in the evening, and at bedtime. 400 each 3   ketoconazole (NIZORAL) 2 % cream Apply 1 application topically daily.     latanoprost (XALATAN) 0.005 % ophthalmic  solution Place 1 drop into both eyes at bedtime.     lidocaine (LINDAMANTLE) 3 % CREA cream Apply 1 application topically every Monday, Wednesday, and Friday. Prior to dialysis     Lidocaine 4 % PTCH Place 1 patch onto the skin daily.     Menthol, Topical Analgesic, (BIOFREEZE EX) Apply 1 application  topically as needed.     midodrine (PROAMATINE) 10 MG tablet Take 10 mg by mouth 3 (three) times daily. Hold if SBP>130     pantoprazole (PROTONIX) 40 MG tablet Take 40 mg by mouth at bedtime.     Pramoxine-Calamine (AVEENO ANTI-ITCH EX) Apply 1 application topically every 6 (six) hours as needed (itching).     traZODone (DESYREL) 50 MG tablet Take 50 mg by mouth at bedtime.     No facility-administered medications prior to visit.    PAST MEDICAL  HISTORY: Past Medical History:  Diagnosis Date   Arthritis    Asthma    Atrial fibrillation (HCC)    CHF (congestive heart failure) (HCC)    CKD (chronic kidney disease), stage III (HCC)    COPD (chronic obstructive pulmonary disease) ([place])    Diabetes mellitus    INSULIN DEPENDENT   Dialysis patient ([place])    Gout    Heart murmur    no issues per pt   History of kidney stones    Hyperlipemia    Hypertension    Psoriasis    Renal disorder    congenital   Single kidney    Sleep apnea    doesn't use the Cpap    PAST SURGICAL HISTORY: Past Surgical History:  Procedure Laterality Date   A/V FISTULAGRAM Left 10/31/2020   Procedure: A/V FISTULAGRAM;  Surgeon: Angelia Mould, MD;  Location: [facility];  Service: Cardiovascular;  Laterality: Left;   ABDOMINAL HYSTERECTOMY     AV FISTULA PLACEMENT Left 11/17/2018   Procedure: BRACHIO-CEPHALIC ARTERIOVENOUS (AV) FISTULA CREATION IN LEFT ARM;  Surgeon: Marty Heck, MD;  Location: [facility];  Service: Vascular;  Laterality: Left;   CARDIOVERSION N/A 03/03/2021   Procedure: CARDIOVERSION;  Surgeon: Larey Dresser, MD;  Location: [facility] ENDOSCOPY;  Service: Cardiovascular;   Laterality: N/A;   CARDIOVERSION N/A 03/12/2021   Procedure: CARDIOVERSION;  Surgeon: Larey Dresser, MD;  Location: [facility] ENDOSCOPY;  Service: Cardiovascular;  Laterality: N/A;   CHOLECYSTECTOMY     INSERTION OF DIALYSIS CATHETER Right 01/26/2019   Procedure: INSERTION OF DIALYSIS CATHETER, right internal jugular;  Surgeon: Angelia Mould, MD;  Location: [facility];  Service: Vascular;  Laterality: Right;   LEFT AND RIGHT HEART CATHETERIZATION WITH CORONARY ANGIOGRAM N/A 11/29/2013   Procedure: LEFT AND RIGHT HEART CATHETERIZATION WITH CORONARY ANGIOGRAM;  Surgeon: Jacolyn Reedy, MD;  Location: [facility] CATH LAB;  Service: Cardiovascular;  Laterality: N/A;   LEFT HEART CATH AND CORONARY ANGIOGRAPHY N/A 02/24/2021   Procedure: LEFT HEART CATH AND CORONARY ANGIOGRAPHY;  Surgeon: Larey Dresser, MD;  Location: [facility];  Service: Cardiovascular;  Laterality: N/A;   PERIPHERAL VASCULAR INTERVENTION Left 10/31/2020   Procedure: PERIPHERAL VASCULAR INTERVENTION;  Surgeon: Angelia Mould, MD;  Location: [facility];  Service: Cardiovascular;  Laterality: Left;   RIGHT HEART CATH N/A 11/23/2017   Procedure: RIGHT HEART CATH;  Surgeon: Larey Dresser, MD;  Location: [facility];  Service: Cardiovascular;  Laterality: N/A;   RIGHT/LEFT HEART CATH AND CORONARY ANGIOGRAPHY N/A 06/15/2019   Procedure: RIGHT/LEFT HEART CATH AND CORONARY ANGIOGRAPHY;  Surgeon: Larey Dresser, MD;  Location: [place] [facility];  Service: Cardiovascular;  Laterality: N/A;    FAMILY HISTORY: Family History  Problem Relation Age of Onset   Lupus Daughter    Cancer Mother 77       type unknown   CVA Father 58   Cancer Brother 73       type unknown    SOCIAL HISTORY: Social History   Socioeconomic History   Marital status: Widowed    Spouse name: Not on file   Number of children: 2   Years of education: Not on file   Highest education level: Not on file  Occupational History    Occupation: retired  Tobacco Use   Smoking status: Former    Packs/day: 1.00    Years: 20.00    Total pack years: 20.00  Types: Cigarettes    Quit date: 02/22/2009    Years since quitting: 13.1   Smokeless tobacco: Never  Vaping Use   Vaping Use: Never used  Substance and Sexual Activity   Alcohol use: Yes    Alcohol/week: 0.0 standard drinks of alcohol    Comment: occasional beer   Drug use: Not Currently    Comment: marijuana in the past   Sexual activity: Never  Other Topics Concern   Not on file  Social History Narrative   Not on file   Social Determinants of Health   Financial Resource Strain: Low Risk  (02/27/2021)   Overall Financial Resource Strain (CARDIA)    Difficulty of Paying Living Expenses: Not very hard  Food Insecurity: No Food Insecurity (02/27/2021)   Hunger Vital Sign    Worried About Running Out of Food in the Last Year: Never true    Ran Out of Food in the Last Year: Never true  Transportation Needs: No Transportation Needs (02/27/2021)   PRAPARE - Hydrologist (Medical): No    Lack of Transportation (Non-Medical): No  Physical Activity: Not on file  Stress: Not on file  Social Connections: Not on file  Intimate Partner Violence: Not on file    PHYSICAL EXAM  GENERAL EXAM/CONSTITUTIONAL: Vitals:  Vitals:   04/15/22 1145  BP: (!) 168/62  Pulse: 69  Weight: 183 lb (83 kg)  Height: '5\' 2"'$  (1.575 m)    Body mass index is 33.47 kg/m. Wt Readings from Last 3 Encounters:  04/15/22 183 lb (83 kg)  04/13/22 183 lb (83 kg)  01/19/22 183 lb 9.6 oz (83.3 kg)   Patient is in no distress; well developed, nourished and groomed; neck is supple  EYES: Visual fields full to confrontation, Extraocular movements intacts,   MUSCULOSKELETAL: Gait, strength, tone, movements noted in Neurologic exam below  NEUROLOGIC: MENTAL STATUS:      No data to display            04/15/2022   11:49 AM 07/09/2021    2:10 PM   Montreal Cognitive Assessment   Visuospatial/ Executive (0/5) 0 3  Naming (0/3) 1 1  Attention: Read list of digits (0/2) 1 2  Attention: Read list of letters (0/1) 0 1  Attention: Serial 7 subtraction starting at 100 (0/3) 2 1  Language: Repeat phrase (0/2) 2 1  Language : Fluency (0/1) 1 1  Abstraction (0/2) 1 2  Delayed Recall (0/5) 5 0  Orientation (0/6) 5 6  Total 18 18  Adjusted Score (based on education) 18      CRANIAL NERVE:  2nd, 3rd, 4th, 6th - visual fields full to confrontation, extraocular muscles intact, no nystagmus 5th - facial sensation symmetric 7th - facial strength symmetric 8th - hearing intact 9th - palate elevates symmetrically, uvula midline 11th - shoulder shrug symmetric 12th - tongue protrusion midline  MOTOR:  normal bulk and tone, at least antigravity   SENSORY:  normal and symmetric to light touch  COORDINATION:  Action tremor present  GAIT/STATION:  Deferred    DIAGNOSTIC DATA (LABS, IMAGING, TESTING) - I reviewed patient records, labs, notes, testing and imaging myself where available.  Lab Results  Component Value Date   WBC 10.3 04/13/2022   HGB 12.0 04/13/2022   HCT 37.4 04/13/2022   MCV 95.4 04/13/2022   PLT 237 04/13/2022      Component Value Date/Time   NA 140 04/13/2022 1943   K 4.1 04/13/2022  1943   CL 97 (L) 04/13/2022 1943   CO2 32 04/13/2022 1943   GLUCOSE 224 (H) 04/13/2022 1943   BUN 20 04/13/2022 1943   CREATININE 5.84 (H) 04/13/2022 1943   CALCIUM 9.1 04/13/2022 1943   PROT 7.2 04/13/2022 1943   ALBUMIN 3.0 (L) 04/13/2022 1943   AST 15 04/13/2022 1943   ALT 15 04/13/2022 1943   ALKPHOS 125 04/13/2022 1943   BILITOT 0.4 04/13/2022 1943   GFRNONAA 7 (L) 04/13/2022 1943   GFRAA 9 (L) 10/24/2019 1607   Lab Results  Component Value Date   CHOL 111 01/19/2022   HDL 49 01/19/2022   LDLCALC 41 01/19/2022   TRIG 107 01/19/2022   CHOLHDL 2.3 01/19/2022   Lab Results  Component Value Date   HGBA1C  9.1 (H) 02/18/2021   No results found for: "VITAMINB12" Lab Results  Component Value Date   TSH 1.062 01/19/2022    Head CT 06/07/21 1. No acute intracranial hemorrhage. 2. Atrophy with chronic microvascular ischemic changes.     ASSESSMENT AND PLAN  82 y.o. year old female with atrial fibrillation, on Eliquis, CHF, hypertension, hyperlipidemia, end-stage renal disease on dialysis Monday Wednesday Friday who is presenting for memory problem.  Patient feels that her memory is stable but daughter reports that patient is more forgetful regarding short-term memory.  Today her MoCA score is 18 out of 30 indicative of impairment.  The plan for now is to obtain dementia labs including B12 and ATN profile.  If positive, I will likely start the patient on Aricept. On exam she was noted to have action tremor consistent with essential tremor.  I will start her on low-dose propranolol 10 mg twice daily and patient will contact me for update.  I will see her in 6 months for follow-up or sooner if worse    1. Mild cognitive impairment   2. Essential tremor     Patient Instructions  Start propanolol 10 mg twice daily for management of the essential tremors  Will obtain Dementia labs including B12 and ATN profile  Continue your other medications Return in 6 months   Orders Placed This Encounter  Procedures   ATN PROFILE   Vitamin B12    Meds ordered this encounter  Medications   propranolol (INDERAL) 10 MG tablet    Sig: Take 1 tablet (10 mg total) by mouth 2 (two) times daily.    Dispense:  60 tablet    Refill:  0    Return in about 6 months (around 10/14/2022).  I have spent a total of 45 minutes dedicated to this patient today, preparing to see patient, performing a medically appropriate examination and evaluation, ordering tests and/or medications and procedures, and counseling and educating the patient/family/caregiver; independently interpreting result and communicating results to  the family/patient/caregiver; and documenting clinical information in the electronic medical record.   Alric Ran, MD 04/16/2022, 10:37 AM  [facility] [address], [place], [place] [address] [phone]

## 2022-04-17 LAB — ATN PROFILE
A -- Beta-amyloid 42/40 Ratio: 0.105 (ref >0.102)
Beta-amyloid 40: 614.25 pg/mL
Beta-amyloid 42: 64.65 pg/mL
N -- NfL, Plasma: 22.7 pg/mL — ABNORMAL HIGH (ref 0.00–7.64)
T -- p-tau181: 11.9 pg/mL — ABNORMAL HIGH (ref 0.00–0.97)

## 2022-04-17 LAB — VITAMIN B12: Vitamin B-12: 818 pg/mL (ref 232–1245)

## 2022-04-19 ENCOUNTER — Other Ambulatory Visit: Payer: Self-pay | Admitting: Neurology

## 2022-04-19 MED ORDER — DONEPEZIL HCL 5 MG PO TABS: 5.0000 mg | ORAL_TABLET | Freq: Every day | ORAL | 11 refills | Status: DC

## 2022-04-27 ENCOUNTER — Telehealth: Payer: Self-pay | Admitting: Neurology

## 2022-04-27 ENCOUNTER — Other Ambulatory Visit: Payer: Self-pay

## 2022-04-27 MED ORDER — DONEPEZIL HCL 5 MG PO TABS: 5.0000 mg | ORAL_TABLET | Freq: Every day | ORAL | 11 refills | Status: DC

## 2022-04-27 NOTE — Telephone Encounter (Signed)
Faxed the requested papers to the number provided

## 2022-04-27 NOTE — Telephone Encounter (Signed)
Stephanie Woodward called from [facility]. Stated he needs pt last office visit and a copy of prescription faxed to [phone]

## 2022-05-12 ENCOUNTER — Ambulatory Visit: Payer: Medicare (Managed Care) | Admitting: Neurology

## 2022-05-19 ENCOUNTER — Emergency Department (HOSPITAL_COMMUNITY): Payer: Medicare (Managed Care)

## 2022-05-19 ENCOUNTER — Encounter (HOSPITAL_COMMUNITY): Payer: Self-pay | Admitting: Emergency Medicine

## 2022-05-19 ENCOUNTER — Observation Stay (HOSPITAL_COMMUNITY)
Admission: EM | Admit: 2022-05-19 | Discharge: 2022-05-20 | Disposition: A | Discharge status: Home / Self Care | Payer: Medicare (Managed Care) | Attending: Family Medicine | Admitting: Family Medicine

## 2022-05-19 DIAGNOSIS — F0390 Unspecified dementia without behavioral disturbance: Secondary | ICD-10-CM | POA: Diagnosis not present

## 2022-05-19 DIAGNOSIS — N186 End stage renal disease: Secondary | ICD-10-CM | POA: Diagnosis not present

## 2022-05-19 DIAGNOSIS — R7989 Other specified abnormal findings of blood chemistry: Secondary | ICD-10-CM | POA: Diagnosis not present

## 2022-05-19 DIAGNOSIS — Z6833 Body mass index (BMI) 33.0-33.9, adult: Secondary | ICD-10-CM | POA: Insufficient documentation

## 2022-05-19 DIAGNOSIS — G250 Essential tremor: Secondary | ICD-10-CM | POA: Insufficient documentation

## 2022-05-19 DIAGNOSIS — R0789 Other chest pain: Principal | ICD-10-CM | POA: Insufficient documentation

## 2022-05-19 DIAGNOSIS — E1122 Type 2 diabetes mellitus with diabetic chronic kidney disease: Secondary | ICD-10-CM | POA: Insufficient documentation

## 2022-05-19 DIAGNOSIS — I480 Paroxysmal atrial fibrillation: Secondary | ICD-10-CM | POA: Diagnosis not present

## 2022-05-19 DIAGNOSIS — E1129 Type 2 diabetes mellitus with other diabetic kidney complication: Secondary | ICD-10-CM | POA: Diagnosis present

## 2022-05-19 DIAGNOSIS — E783 Hyperchylomicronemia: Secondary | ICD-10-CM

## 2022-05-19 DIAGNOSIS — E669 Obesity, unspecified: Secondary | ICD-10-CM | POA: Diagnosis not present

## 2022-05-19 DIAGNOSIS — E785 Hyperlipidemia, unspecified: Secondary | ICD-10-CM | POA: Diagnosis present

## 2022-05-19 DIAGNOSIS — I5032 Chronic diastolic (congestive) heart failure: Secondary | ICD-10-CM | POA: Diagnosis not present

## 2022-05-19 DIAGNOSIS — R9431 Abnormal electrocardiogram [ECG] [EKG]: Secondary | ICD-10-CM | POA: Diagnosis not present

## 2022-05-19 DIAGNOSIS — Z992 Dependence on renal dialysis: Secondary | ICD-10-CM | POA: Insufficient documentation

## 2022-05-19 DIAGNOSIS — I959 Hypotension, unspecified: Secondary | ICD-10-CM | POA: Diagnosis not present

## 2022-05-19 DIAGNOSIS — J45909 Unspecified asthma, uncomplicated: Secondary | ICD-10-CM | POA: Diagnosis not present

## 2022-05-19 DIAGNOSIS — J449 Chronic obstructive pulmonary disease, unspecified: Secondary | ICD-10-CM | POA: Diagnosis not present

## 2022-05-19 DIAGNOSIS — Z7901 Long term (current) use of anticoagulants: Secondary | ICD-10-CM | POA: Diagnosis not present

## 2022-05-19 DIAGNOSIS — I2510 Atherosclerotic heart disease of native coronary artery without angina pectoris: Secondary | ICD-10-CM | POA: Diagnosis not present

## 2022-05-19 DIAGNOSIS — Z87891 Personal history of nicotine dependence: Secondary | ICD-10-CM | POA: Diagnosis not present

## 2022-05-19 DIAGNOSIS — I132 Hypertensive heart and chronic kidney disease with heart failure and with stage 5 chronic kidney disease, or end stage renal disease: Secondary | ICD-10-CM | POA: Diagnosis not present

## 2022-05-19 DIAGNOSIS — R079 Chest pain, unspecified: Secondary | ICD-10-CM | POA: Diagnosis present

## 2022-05-19 DIAGNOSIS — Z79899 Other long term (current) drug therapy: Secondary | ICD-10-CM | POA: Insufficient documentation

## 2022-05-19 LAB — BASIC METABOLIC PANEL
Anion gap: 13 (ref 5–15)
BUN: 8 mg/dL (ref 8–23)
CO2: 32 mmol/L (ref 22–32)
Calcium: 8.5 mg/dL — ABNORMAL LOW (ref 8.9–10.3)
Chloride: 95 mmol/L — ABNORMAL LOW (ref 98–111)
Creatinine, Ser: 3.47 mg/dL — ABNORMAL HIGH (ref 0.44–1.00)
GFR, Estimated: 13 mL/min — ABNORMAL LOW (ref >60)
Glucose, Bld: 246 mg/dL — ABNORMAL HIGH (ref 70–99)
Potassium: 4.3 mmol/L (ref 3.5–5.1)
Sodium: 140 mmol/L (ref 135–145)

## 2022-05-19 LAB — CBC
HCT: 39.5 % (ref 36.0–46.0)
Hemoglobin: 12.3 g/dL (ref 12.0–15.0)
MCH: 30.4 pg (ref 26.0–34.0)
MCHC: 31.1 g/dL (ref 30.0–36.0)
MCV: 97.8 fL (ref 80.0–100.0)
Platelets: 265 10*3/uL (ref 150–400)
RBC: 4.04 MIL/uL (ref 3.87–5.11)
RDW: 15.2 % (ref 11.5–15.5)
WBC: 9.1 10*3/uL (ref 4.0–10.5)
nRBC: 0 % (ref 0.0–0.2)

## 2022-05-19 LAB — TROPONIN I (HIGH SENSITIVITY): Troponin I (High Sensitivity): 44 ng/L — ABNORMAL HIGH (ref <18)

## 2022-05-19 MED ORDER — ASPIRIN 81 MG PO CHEW
324.0000 mg | CHEWABLE_TABLET | Freq: Once | ORAL | Status: AC
  Administered 2022-05-19: 324 mg via ORAL
  Filled 2022-05-19: qty 4

## 2022-05-19 NOTE — H&P (Addendum)
Stephanie Woodward [id number] DOB: 01-16-1941 DOA: 05/19/2022     PCP[facility]  Outpatient Specialists:  CARDS: Dr.McLean NEphrology:  Dr.Colondonato Dr Hollie Salk.    NEurology   Coral Ceo, MD      Patient arrived to ER on 05/19/22 at 2144 Referred by Attending Fransico Meadow, MD   Patient coming from:    home Lives  With family    Chief Complaint: chest pain   HPI: Stephanie Woodward is a 82 y.o. female with medical history significant of end-stage renal disease on hemodialysis Monday Wednesday Friday atrial fibrillation, on Eliquis, CHF, hypertension, hyperlipidemia, RHC/LHC was done in 10/15     Presented with  chest pain  Patient is hemodialysis patient she has had chest pain intermittent all day and therefore could not finish her treatment she is not chest pain-free She usually gets hemodialysis on Monday Wednesday and Friday  30 min of Cp while at HD, spontaneously resolved  Had a second epsiode and came in  In December patient had admission to ICU Followed by rehab for 3 to 4 months that stay was complicated by COVID infection  Reports first time Cp lasted 3 min the second 5 min It mild and dull non radiating Not worse with deep breath Felt like a cramp Had diarrhea for 2 days now resolved after immodium   Lab Results  Component Value Date   [place] 05/11/2021   [place] NEGATIVE 05/01/2021   [place] NEGATIVE 03/04/2021   [place] NEGATIVE 02/17/2021     Regarding pertinent Chronic problems:    Hyperlipidemia -  on statins Lipitor (atorvastatin)  Lipid Panel     Component Value Date/Time   CHOL 111 01/19/2022 1155   TRIG 107 01/19/2022 1155   HDL 49 01/19/2022 1155   CHOLHDL 2.3 01/19/2022 1155   VLDL 21 01/19/2022 1155   LDLCALC 41 01/19/2022 1155   Now on midodrine to maintain blood pressure 3 times a week for dialysis  Last echogram was April 2023 preserved EF    CAD  -                  RHC/LHC in 4/21 showed no obstructive CAD, normal filling pressures, mild pulmonary hypertension.        DM 2 -  Lab Results  Component Value Date   HGBA1C 9.1 (H) 02/18/2021   on insulin,       obesity-   BMI Readings from Last 1 Encounters:  04/15/22 33.47 kg/m       COPD - not  followed by pulmonology   not  on baseline oxygen    Symbicort 40 mg pm. Repeat echo showed EF 45-50% with normal RV. PYP scan was negative for TTR amyloid. PFTs showed restrictive disease.      A. Fib -  - CHA2DS2 vas score  7      current  on anticoagulation with Eliquis,           -  Rate control:  Currently controlled with propranolol         - Rhythm control:   amiodarone,     successful DCCV 03/03/21.   End-stage renal disease on hemodialysis Monday Wednesday Friday Lab Results  Component Value Date   CREATININE 3.47 (H) 05/19/2022   CREATININE 5.84 (H) 04/13/2022   CREATININE 6.69 (H) 01/19/2022         Dementia - on Aricept     While in ER: Clinical Course  as of 05/20/22 0025  Wed May 19, 2022  2317 Dr Vickki Muff from cardiology is reviewed.  Recommends admission for serial troponins and to have echo in the morning. [RP]  2348 Dr Roel Cluck from hospitalist to admit.  [RP]    Clinical Course User Index [RP] Fransico Meadow, MD       Lab Orders         Basic metabolic panel         CBC       CXR - . Chronic appearing increased interstitial lung markings with mild, stable lingular linear atelectasis.    Following Medications were ordered in ER: Medications  aspirin chewable tablet 324 mg (324 mg Oral Given 05/19/22 2342)    _______________________________________________________ ER Provider Called:   Cardiology   Dr.Eugwe They Recommend admit to medicine   Will see in AM      ED Triage Vitals  Enc Vitals Group     BP 05/19/22 2146 (!) 118/36     Pulse Rate 05/19/22 2146 61     Resp 05/19/22 2146 20     Temp 05/19/22 2146 99.2 F (37.3 C)     Temp Source  05/19/22 2146 Oral     SpO2 05/19/22 2146 100 %     Weight --      Height --      Head Circumference --      Peak Flow --      Pain Score 05/19/22 2145 0     Pain Loc --      Pain Edu? --      Excl. in [place]? --   TMAX(24)@     _________________________________________ Significant initial  Findings: Abnormal Labs Reviewed  BASIC METABOLIC PANEL - Abnormal; Notable for the following components:      Result Value   Chloride 95 (*)    Glucose, Bld 246 (*)    Creatinine, Ser 3.47 (*)    Calcium 8.5 (*)    GFR, Estimated 13 (*)    All other components within normal limits  TROPONIN I (HIGH SENSITIVITY) - Abnormal; Notable for the following components:   Troponin I (High Sensitivity) 44 (*)    All other components within normal limits      _________________ Troponin   Cardiac Panel (last 3 results) Recent Labs    05/19/22 2150  TROPONINIHS 44*    ECG: Ordered Personally reviewed and interpreted by me showing: HR : 58 Rhythm: Sinus rhythm Nonspecific IVCD with LAD Anterolateral infarct, age indeterminate QTC 608  BNP (last 3 results) Recent Labs    04/13/22 1943  BNP 973.4*    The recent clinical data is shown below. Vitals:   05/19/22 2146 05/19/22 2200 05/19/22 2245  BP: (!) 118/36 (!) 112/37 (!) 103/34  Pulse: 61 (!) 57 (!) 52  Resp: 20 20 15   Temp: 99.2 F (37.3 C)    TempSrc: Oral    SpO2: 100% 100% 100%    WBC     Component Value Date/Time   WBC 9.1 05/19/2022 2150   LYMPHSABS 1.4 04/13/2022 1943   MONOABS 0.9 04/13/2022 1943   EOSABS 0.1 04/13/2022 1943   BASOSABS 0.1 04/13/2022 1943       __________________________________________________________ Recent Labs  Lab 05/19/22 2150  NA 140  K 4.3  CO2 32  GLUCOSE 246*  BUN 8  CREATININE 3.47*  CALCIUM 8.5*    Cr   stable,   Lab Results  Component Value Date   CREATININE 3.47 (  H) 05/19/2022   CREATININE 5.84 (H) 04/13/2022   CREATININE 6.69 (H) 01/19/2022    No results for input(s):  "AST", "ALT", "ALKPHOS", "BILITOT", "PROT", "ALBUMIN" in the last 168 hours. Lab Results  Component Value Date   CALCIUM 8.5 (L) 05/19/2022   PHOS 2.4 (L) 03/05/2021          Plt: Lab Results  Component Value Date   PLT 265 05/19/2022       Recent Labs  Lab 05/19/22 2150  WBC 9.1  HGB 12.3  HCT 39.5  MCV 97.8  PLT 265    HG/HCT  stable,     Component Value Date/Time   HGB 12.3 05/19/2022 2150   HCT 39.5 05/19/2022 2150   MCV 97.8 05/19/2022 2150    _______________________________________________ Hospitalist was called for admission for chest pain  The following Work up has been ordered so far:  Orders Placed This Encounter  Procedures   DG Chest 2 View   Basic metabolic panel   CBC   Document Height and Actual Weight   Inpatient consult to Cardiology   Consult to hospitalist   Consult for [facility] Admission   EKG 12-Lead   ED EKG     OTHER Significant initial  Findings:  labs showing:     DM  labs:  HbA1C: No results for input(s): "HGBA1C" in the last 8760 hours.     CBG (last 3)  No results for input(s): "GLUCAP" in the last 72 hours.        Cultures:    Component Value Date/Time   SDES BLOOD LEFT HAND 05/02/2021 0201   SPECREQUEST  05/02/2021 0201    BOTTLES DRAWN AEROBIC AND ANAEROBIC Blood Culture results may not be optimal due to an inadequate volume of blood received in culture bottles   CULT  05/02/2021 0201    NO GROWTH 5 DAYS Performed at [facility], [place] [address], [place], [place] [address]    REPTSTATUS 05/07/2021 FINAL 05/02/2021 0201     Radiological Exams on Admission: DG Chest 2 View  Result Date: 05/19/2022 CLINICAL DATA:  Chest pain. EXAM: CHEST - 2 VIEW COMPARISON:  April 13, 2022 FINDINGS: The cardiac silhouette is mildly enlarged and unchanged in size. Mild, chronic appearing diffusely increased interstitial lung markings are seen. Mild, stable lingular linear atelectasis is also noted. There is no  evidence of a pleural effusion or pneumothorax. The visualized skeletal structures are unremarkable. IMPRESSION: 1. Chronic appearing increased interstitial lung markings with mild, stable lingular linear atelectasis. 2. A superimposed component of mild interstitial edema cannot be excluded. Electronically Signed   By: Virgina Norfolk M.D.   On: 05/19/2022 22:23   _______________________________________________________________________________________________________ Latest  Blood pressure (!) 103/34, pulse (!) 52, temperature 99.2 F (37.3 C), temperature source Oral, resp. rate 15, SpO2 100 %.   Vitals  labs and radiology finding personally reviewed  Review of Systems:    Pertinent positives include:  chest pain,   Constitutional:  No weight loss, night sweats, Fevers, chills, fatigue, weight loss  HEENT:  No headaches, Difficulty swallowing,Tooth/dental problems,Sore throat,  No sneezing, itching, ear ache, nasal congestion, post nasal drip,  Cardio-vascular:  No Orthopnea, PND, anasarca, dizziness, palpitations.no Bilateral lower extremity swelling  GI:  No heartburn, indigestion, abdominal pain, nausea, vomiting, diarrhea, change in bowel habits, loss of appetite, melena, blood in stool, hematemesis Resp:  no shortness of breath at rest. No dyspnea on exertion, No excess mucus, no productive cough, No non-productive cough, No  coughing up of blood.No change in color of mucus.No wheezing. Skin:  no rash or lesions. No jaundice GU:  no dysuria, change in color of urine, no urgency or frequency. No straining to urinate.  No flank pain.  Musculoskeletal:  No joint pain or no joint swelling. No decreased range of motion. No back pain.  Psych:  No change in mood or affect. No depression or anxiety. No memory loss.  Neuro: no localizing neurological complaints, no tingling, no weakness, no double vision, no gait abnormality, no slurred speech, no confusion  All systems reviewed and  apart from [place] all are negative _______________________________________________________________________________________________ Past Medical History:   Past Medical History:  Diagnosis Date   Arthritis    Asthma    Atrial fibrillation (HCC)    CHF (congestive heart failure) (HCC)    CKD (chronic kidney disease), stage III (HCC)    COPD (chronic obstructive pulmonary disease) (HCC)    Diabetes mellitus    INSULIN DEPENDENT   Dialysis patient ([place])    Gout    Heart murmur    no issues per pt   History of kidney stones    Hyperlipemia    Hypertension    Psoriasis    Renal disorder    congenital   Single kidney    Sleep apnea    doesn't use the Cpap    Past Surgical History:  Procedure Laterality Date   A/V FISTULAGRAM Left 10/31/2020   Procedure: A/V FISTULAGRAM;  Surgeon: Angelia Mould, MD;  Location: [place] [facility];  Service: Cardiovascular;  Laterality: Left;   ABDOMINAL HYSTERECTOMY     AV FISTULA PLACEMENT Left 11/17/2018   Procedure: BRACHIO-CEPHALIC ARTERIOVENOUS (AV) FISTULA CREATION IN LEFT ARM;  Surgeon: Marty Heck, MD;  Location: [facility];  Service: Vascular;  Laterality: Left;   CARDIOVERSION N/A 03/03/2021   Procedure: CARDIOVERSION;  Surgeon: Larey Dresser, MD;  Location: [facility] ENDOSCOPY;  Service: Cardiovascular;  Laterality: N/A;   CARDIOVERSION N/A 03/12/2021   Procedure: CARDIOVERSION;  Surgeon: Larey Dresser, MD;  Location: [facility] ENDOSCOPY;  Service: Cardiovascular;  Laterality: N/A;   CHOLECYSTECTOMY     INSERTION OF DIALYSIS CATHETER Right 01/26/2019   Procedure: INSERTION OF DIALYSIS CATHETER, right internal jugular;  Surgeon: Angelia Mould, MD;  Location: [facility];  Service: Vascular;  Laterality: Right;   LEFT AND RIGHT HEART CATHETERIZATION WITH CORONARY ANGIOGRAM N/A 11/29/2013   Procedure: LEFT AND RIGHT HEART CATHETERIZATION WITH CORONARY ANGIOGRAM;  Surgeon: Jacolyn Reedy, MD;  Location[facility] CATH LAB;  Service: Cardiovascular;   Laterality: N/A;   LEFT HEART CATH AND CORONARY ANGIOGRAPHY N/A 02/24/2021   Procedure: LEFT HEART CATH AND CORONARY ANGIOGRAPHY;  Surgeon: Larey Dresser, MD;  Location: [place] [facility];  Service: Cardiovascular;  Laterality: N/A;   PERIPHERAL VASCULAR INTERVENTION Left 10/31/2020   Procedure: PERIPHERAL VASCULAR INTERVENTION;  Surgeon: Angelia Mould, MD;  Location: [place] [facility];  Service: Cardiovascular;  Laterality: Left;   RIGHT HEART CATH N/A 11/23/2017   Procedure: RIGHT HEART CATH;  Surgeon: Larey Dresser, MD;  Location: [place] [facility];  Service: Cardiovascular;  Laterality: N/A;   RIGHT/LEFT HEART CATH AND CORONARY ANGIOGRAPHY N/A 06/15/2019   Procedure: RIGHT/LEFT HEART CATH AND CORONARY ANGIOGRAPHY;  Surgeon: Larey Dresser, MD;  Location: [place] [facility];  Service: Cardiovascular;  Laterality: N/A;    Social History:  Ambulatory   wheelchair bound,           reports that  she quit smoking about 13 years ago. Her smoking use included cigarettes. She has a 20.00 pack-year smoking history. She has never used smokeless tobacco. She reports current alcohol use. She reports that she does not currently use drugs.     Family History:  Family History  Problem Relation Age of Onset   Lupus Daughter    Cancer Mother 70       type unknown   CVA Father 45   Cancer Brother 68       type unknown   ______________________________________________________________________________________________ Allergies: Allergies  Allergen Reactions   Egg-Derived Products Nausea And Vomiting   Lisinopril Nausea And Vomiting   Penicillins Nausea And Vomiting    Has patient had a PCN reaction causing immediate rash, facial/tongue/throat swelling, SOB or lightheadedness with hypotension: No Has patient had a PCN reaction causing severe rash involving mucus membranes or skin necrosis: No Has patient had a PCN reaction that required hospitalization: No Has patient had a PCN  reaction occurring within the last 10 years: No If all of the above answers are "NO", then may proceed with Cephalosporin use.    Other Other (See Comments)     Prior to Admission medications   Medication Sig Start Date End Date Taking? Authorizing Provider  acetaminophen (TYLENOL) 500 MG tablet Take 1,000 mg by mouth in the morning and at bedtime.   Yes [provider]  albuterol (PROVENTIL HFA;VENTOLIN HFA) 108 (90 BASE) MCG/ACT inhaler Inhale 2 puffs into the lungs in the morning and at bedtime.   Yes [provider]  amiodarone (PACERONE) 200 MG tablet Take 200 mg by mouth daily.   Yes [provider]  atorvastatin (LIPITOR) 80 MG tablet Take 80 mg by mouth at bedtime.   Yes [provider]  budesonide-formoterol (SYMBICORT) 160-4.5 MCG/ACT inhaler Inhale 2 puffs into the lungs 2 (two) times daily. 04/09/20  Yes Collene Gobble, MD  Cholecalciferol (VITAMIN D3) 25 MCG (1000 UT) tablet Take 1,000 Units by mouth daily.   Yes [provider]  cyanocobalamin (VITAMIN B12) 500 MCG tablet Take 500 mcg by mouth daily.   Yes [provider]  diphenhydrAMINE (BENADRYL) 25 mg capsule Take 25 mg by mouth every 6 (six) hours as needed for itching.   Yes [provider]  donepezil (ARICEPT) 5 MG tablet Take 1 tablet (5 mg total) by mouth at bedtime. Patient taking differently: Take 5 mg by mouth daily. 04/27/22  Yes Camara, Maryan Puls, MD  ELIQUIS 5 MG TABS tablet Take 1 tablet by mouth twice daily Patient taking differently: Take 5 mg by mouth 2 (two) times daily. 07/23/20  Yes Larey Dresser, MD  insulin glargine (SEMGLEE) 100 UNIT/ML injection Inject 16 Units into the skin at bedtime.   Yes [provider]  latanoprost (XALATAN) 0.005 % ophthalmic solution Place 1 drop into both eyes at bedtime.   Yes [provider]  lidocaine (LINDAMANTLE) 3 % CREA cream Apply 1 application topically every Monday, Wednesday, and Friday.  Prior to dialysis 03/06/20  Yes [provider]  Lidocaine 4 % PTCH Place 1 patch onto the skin daily as needed (for pain). 1/2 patch applied to shoulder or back 11/12/20  Yes [provider]  Menthol, Topical Analgesic, (BIOFREEZE EX) Apply 1 application  topically in the morning and at bedtime.   Yes [provider]  midodrine (PROAMATINE) 10 MG tablet Take 10 mg by mouth 3 (three) times a week. Taken before dialysis on Monday, Wednesday, Friday Hold  if SBP>130   Yes [provider]  pantoprazole (PROTONIX) 40 MG tablet Take 40 mg by mouth at bedtime. 10/26/18  Yes [provider]  Pramoxine-Calamine (AVEENO ANTI-ITCH EX) Apply 1 application topically every 6 (six) hours as needed (itching).   Yes [provider]  traZODone (DESYREL) 50 MG tablet Take 25 mg by mouth at bedtime.   Yes [provider]  Insulin Pen Needle 32G X 4 MM MISC 1 Device by Does not apply route in the morning, at noon, in the evening, and at bedtime. 02/06/20   Shamleffer, Melanie Crazier, MD  propranolol (INDERAL) 10 MG tablet Take 1 tablet (10 mg total) by mouth 2 (two) times daily. Patient not taking: Reported on 05/19/2022 04/15/22 05/15/22  Alric Ran, MD    ___________________________________________________________________________________________________ Physical Exam:    05/19/2022   10:45 PM 05/19/2022   10:00 PM 05/19/2022    9:46 PM  Vitals with BMI  Systolic [id number]  Diastolic 34 37 36  Pulse 52 57 61     1. General:  in No  Acute distress    Chronically ill  -appearing 2. Psychological: Alert and   Oriented 3. Head/ENT:  Dry Mucous Membranes                          Head Non traumatic, neck supple                          Poor Dentition 4. SKIN:  normal Skin turgor,  Skin clean Dry and intact no rash    5. Heart: Regular rate and rhythm no  Murmur, no Rub or gallop 6. Lungs:  , no wheezes or crackles   7. Abdomen: Soft,  non-tender,   distended   obese  bowel sounds present 8. Lower extremities: no clubbing, cyanosis, no  edema 9. Neurologically Grossly intact, moving all 4 extremities equally   10. MSK: Normal range of motion    Chart has been reviewed  ______________________________________________________________________________________________  Assessment/Plan  82 y.o. female with medical history significant of end-stage renal disease on hemodialysis Monday Wednesday Friday atrial fibrillation, on Eliquis, CHF, hypertension, hyperlipidemia, RHC/LHC was done in 10/15    Admitted for atypical chest pain  Present on Admission:  Chest pain  Hypotension  Type 2 diabetes mellitus, controlled, with renal complications (HCC)  Paroxysmal atrial fibrillation (HCC)  Hyperlipidemia  Prolonged QT interval  Dementia without behavioral disturbance (HCC)  Elevated troponin     Hypotension Continue midodrine as needed for hemodialysis  ESRD on hemodialysis [facility]) Patient received hemodialysis on Wednesday, 05/19/2022 but was not complete will notify nephrology the patient is being admitted may need early dialysis at this point no indication for emergent dialysis  Type 2 diabetes mellitus, controlled, with renal complications ([place]) Order sliding scale Cont Semglee decrease to 12 units qhs  Paroxysmal atrial fibrillation (HCC) Continue amiodarone 200 mg daily continue Eliquis 5 mg p.o. twice a day  Chest pain Somewhat atypical continue to cycle cardiac enzymes echogram pneumonia appreciate cardiology consult EKG nonacute currently chest pain-free  Hyperlipidemia Continue Lipitor 80 mg a day  Prolonged QT interval - will monitor on tele avoid QT prolonging medications, rehydrate correct electrolytes   Dementia without behavioral disturbance ([place]) Monitor for any sundowning continue Aricept 5 mg a day  Elevated troponin In the setting of ESRD will cont to cycle Echo in AM now CP free    Other  plan as per  orders.  DVT prophylaxis: Elqiuis   Code Status:    Code Status: Prior FULL CODE  as per patient   I had personally discussed CODE STATUS with patient and family  ACP none    Family Communication:   Family   at  Bedside  plan of care was discussed   with  Daughter,    Disposition Plan:      To home once workup is complete and patient is stable   Following barriers for discharge:                            Electrolytes corrected                                                             Pain controlled with PO medications                                                          Will need consultants to evaluate patient prior to discharge                       Diabetes care coordinator                                     Consults called: Cardiology is aware, sent page to nephrology to add to AM list  Admission status:  ED Disposition     ED Disposition  [place]: [place] [[id number]]  Level of Care: Progressive [102]  Admit to Progressive based on following criteria: CARDIOVASCULAR & THORACIC of moderate stability with acute coronary syndrome symptoms/low risk myocardial infarction/hypertensive urgency/arrhythmias/heart failure potentially compromising stability and stable post cardiovascular intervention patients.  May place patient in observation at [facility] or [place] if equivalent level of care is available:: No  Covid Evaluation: Asymptomatic - no recent exposure (last 10 days) testing not required  Diagnosis: Chest pain [id number]  Admitting Physician: Toy Baker [[id number]]  Attending Physician: Toy Baker [[id number]]           Obs     Level of care       progressive tele indefinitely please discontinue once patient no longer qualifies COVID-19 Labs   Stephanie Woodward 05/20/2022, 12:57 AM    [facility]     after 2 AM please page floor coverage PA If 7AM-7PM, please contact  the day team taking care of the patient using [email or web address]

## 2022-05-19 NOTE — ED Provider Notes (Signed)
[facility] Provider Note   CSN: [id number] Arrival date & time: 05/19/22  2144     History {Add pertinent medical, surgical, social history, OB history to HPI:1} No chief complaint on file.   Stephanie Woodward is a 82 y.o. female.  82 year old female with a history of ESRD on Monday Wednesday Friday dialysis, atrial fibrillation on Eliquis, CHF, pulmonary hypertension, diabetes, hypertension, and hyperlipidemia who presents emergency department with chest pain.  Patient reports that she was at dialysis today and experienced approximately 30 minutes of substernal chest pain.  Describes it as sharp.  No additional symptoms.  Denies diaphoresis, shortness of breath, vomiting, dizziness, or palpitations.  Reports that she did not think much of this until she went home and had the same pain at 8 PM that lasted an additional 30 minutes.  No personal history of MI.  Daughter had an MI at the age of 13.  Last heart cath was on 02/2021 that did not show any significant CAD.  Lives at home by herself.  History of prior tobacco use.       Home Medications Prior to Admission medications   Medication Sig Start Date End Date Taking? Authorizing Provider  acetaminophen (TYLENOL) 500 MG tablet Take 1,000 mg by mouth daily.    [provider]  albuterol (PROVENTIL HFA;VENTOLIN HFA) 108 (90 BASE) MCG/ACT inhaler Inhale 2 puffs into the lungs in the morning and at bedtime.    [provider]  amiodarone (PACERONE) 200 MG tablet Take 200 mg by mouth daily.    [provider]  atorvastatin (LIPITOR) 80 MG tablet Take 80 mg by mouth at bedtime.    [provider]  budesonide-formoterol (SYMBICORT) 160-4.5 MCG/ACT inhaler Inhale 2 puffs into the lungs 2 (two) times daily. 04/09/20   Collene Gobble, MD  diphenhydrAMINE (BENADRYL) 25 mg capsule Take 25 mg by mouth every 6 (six) hours as needed for itching.    [provider]   donepezil (ARICEPT) 5 MG tablet Take 1 tablet (5 mg total) by mouth at bedtime. 04/27/22   Alric Ran, MD  ELIQUIS 5 MG TABS tablet Take 1 tablet by mouth twice daily 07/23/20   Larey Dresser, MD  insulin glargine (SEMGLEE) 100 UNIT/ML injection Inject 16 Units into the skin daily.    [provider]  Insulin Pen Needle 32G X 4 MM MISC 1 Device by Does not apply route in the morning, at noon, in the evening, and at bedtime. 02/06/20   Shamleffer, Melanie Crazier, MD  ketoconazole (NIZORAL) 2 % cream Apply 1 application topically daily. 02/16/21   [provider]  latanoprost (XALATAN) 0.005 % ophthalmic solution Place 1 drop into both eyes at bedtime.    [provider]  lidocaine (LINDAMANTLE) 3 % CREA cream Apply 1 application topically every Monday, Wednesday, and Friday. Prior to dialysis 03/06/20   [provider]  Lidocaine 4 % PTCH Place 1 patch onto the skin daily. 11/12/20   [provider]  Menthol, Topical Analgesic, (BIOFREEZE EX) Apply 1 application  topically as needed.    [provider]  midodrine (PROAMATINE) 10 MG tablet Take 10 mg by mouth 3 (three) times daily. Hold if SBP>130    [provider]  pantoprazole (PROTONIX) 40 MG tablet Take 40 mg by mouth at bedtime. 10/26/18   [provider]  Pramoxine-Calamine (AVEENO ANTI-ITCH EX) Apply 1 application topically every 6 (six) hours as needed (itching).  [provider]  propranolol (INDERAL) 10 MG tablet Take 1 tablet (10 mg total) by mouth 2 (two) times daily. 04/15/22 05/15/22  Alric Ran, MD  traZODone (DESYREL) 50 MG tablet Take 50 mg by mouth at bedtime.    [provider]      Allergies    Egg-derived products, Lisinopril, Penicillins, and Other    Review of Systems   Review of Systems  Physical Exam Updated Vital Signs BP (!) 118/36 (BP Location: Right Arm)   Pulse 61   Temp 99.2 F (37.3 C) (Oral)   Resp 20   SpO2  100%  Physical Exam Vitals and nursing note reviewed.  Constitutional:      General: She is not in acute distress.    Appearance: She is well-developed.  HENT:     Head: Normocephalic and atraumatic.     Right Ear: External ear normal.     Left Ear: External ear normal.     Nose: Nose normal.  Eyes:     Extraocular Movements: Extraocular movements intact.     Conjunctiva/sclera: Conjunctivae normal.     Pupils: Pupils are equal, round, and reactive to light.  Cardiovascular:     Rate and Rhythm: Normal rate and regular rhythm.     Heart sounds: No murmur heard. Pulmonary:     Effort: Pulmonary effort is normal. No respiratory distress.     Breath sounds: Normal breath sounds.  Abdominal:     General: Abdomen is flat. There is no distension.     Palpations: Abdomen is soft. There is no mass.     Tenderness: There is no abdominal tenderness. There is no guarding.  Musculoskeletal:     Cervical back: Normal range of motion and neck supple.     Right lower leg: No edema.     Left lower leg: No edema.     Comments: Fistula in left upper extremity with bruit and thrill.  Skin:    General: Skin is warm and dry.  Neurological:     Mental Status: She is alert and oriented to person, place, and time. Mental status is at baseline.  Psychiatric:        Mood and Affect: Mood normal.     ED Results / Procedures / Treatments   Labs (all labs ordered are listed, but only abnormal results are displayed) Labs Reviewed  BASIC METABOLIC PANEL  CBC  TROPONIN I (HIGH SENSITIVITY)    EKG EKG Interpretation  Date/Time:  Wednesday May 19 2022 21:49:00 EDT Ventricular Rate:  58 PR Interval:  206 QRS Duration: 140 QT Interval:  618 QTC Calculation: 608 R Axis:   -79 Text Interpretation: Sinus rhythm Nonspecific IVCD with LAD Anterolateral infarct, age indeterminate Confirmed by Margaretmary Eddy [id number]) on 05/19/2022 9:52:45 PM  Radiology No results found.  Procedures Procedures   {Document cardiac monitor, telemetry assessment procedure when appropriate:1}  Medications Ordered in ED Medications - No data to display  ED Course/ Medical Decision Making/ A&P   {   Click here for ABCD2, HEART and other calculatorsREFRESH Note before signing :1}                          Medical Decision Making Amount and/or Complexity of Data Reviewed Labs: ordered. Radiology: ordered.   ***  {Document critical care time when appropriate:1} {Document review of labs and clinical decision tools ie heart score, Chads2Vasc2 etc:1}  {Document your independent review of radiology images, and  any outside records:1} {Document your discussion with family members, caretakers, and with consultants:1} {Document social determinants of health affecting pt's care:1} {Document your decision making why or why not admission, treatments were needed:1} Final Clinical Impression(s) / ED Diagnoses Final diagnoses:  None    Rx / DC Orders ED Discharge Orders     None

## 2022-05-19 NOTE — H&P (Incomplete)
Stephanie Woodward [id number] DOB: 1941-01-15 DOA: 05/19/2022     [facility]  Outpatient Specialists:  CARDS: Dr.McLean NEphrology:  Dr.Colondonato Dr Hollie Salk.    NEurology   Coral Ceo, MD      Patient arrived to ER on 05/19/22 at 2144 Referred by Attending Fransico Meadow, MD   Patient coming from:    home Lives  With family    Chief Complaint: chest pain   HPI: Stephanie Woodward is a 82 y.o. female with medical history significant of end-stage renal disease on hemodialysis Monday Wednesday Friday atrial fibrillation, on Eliquis, CHF, hypertension, hyperlipidemia, RHC/LHC was done in 10/15     Presented with  chest pain  Patient is hemodialysis patient she has had chest pain intermittent all day and therefore could not finish her treatment she is not chest pain-free She usually gets hemodialysis on Monday Wednesday and Friday  30 min of Cp while at HD, spontaneously resolved  Had a second epsiode and came in  In December patient had admission to ICU Followed by rehab for 3 to 4 months that stay was complicated by COVID infection  Lab Results  Component Value Date   [place] 05/11/2021   [place] NEGATIVE 05/01/2021   [place] NEGATIVE 03/04/2021   [place] NEGATIVE 02/17/2021     Regarding pertinent Chronic problems: ***  ****Hyperlipidemia - *on statins {statin:315258}  Lipid Panel     Component Value Date/Time   CHOL 111 01/19/2022 1155   TRIG 107 01/19/2022 1155   HDL 49 01/19/2022 1155   CHOLHDL 2.3 01/19/2022 1155   VLDL 21 01/19/2022 1155   LDLCALC 41 01/19/2022 1155    ***HTN on   ***chronic CHF diastolic/systolic/ combined - last echo***    CAD  -                 RHC/LHC in 4/21 showed no obstructive CAD, normal filling pressures, mild pulmonary hypertension.        DM 2 -  Lab Results  Component Value Date   HGBA1C 9.1 (H) 02/18/2021   ****on insulin, PO meds only, diet  controlled  ***Hypothyroidism:  Lab Results  Component Value Date   TSH 1.062 01/19/2022   on synthroid   obesity-   BMI Readings from Last 1 Encounters:  04/15/22 33.47 kg/m       COPD - not **followed by pulmonology *** not  on baseline oxygen  *L,   Symbicort 40 mg pm. Repeat echo showed EF 45-50% with normal RV. PYP scan was negative for TTR amyloid. PFTs showed restrictive disease.   *** OSA -on nocturnal oxygen, *CPAP, *noncompliant with CPAP  *** Hx of CVA - *with/out residual deficits on Aspirin 81 mg, 325, Plavix   A. Fib -  - CHA2DS2 vas score  7      current  on anticoagulation with Eliquis,           -  Rate control:  Currently controlled with ***Toprolol,  *Metoprolol,* Diltiazem, *Coreg          - Rhythm control:   amiodarone,     successful DCCV 03/03/21.   End-stage renal disease on hemodialysis Monday Wednesday Friday Lab Results  Component Value Date   CREATININE 3.47 (H) 05/19/2022   CREATININE 5.84 (H) 04/13/2022   CREATININE 6.69 (H) 01/19/2022       *** Dementia - on Aricept** [place]      While in ER: Clinical Course as of  05/19/22 2346  Wed May 19, 2022  2317 Dr Vickki Muff from cardiology is reviewed.  Recommends admission for serial troponins and to have echo in the morning. [RP]    Clinical Course User Index [RP] Fransico Meadow, MD       Lab Orders         Basic metabolic panel         CBC       CXR - . Chronic appearing increased interstitial lung markings with mild, stable lingular linear atelectasis.    Following Medications were ordered in ER: Medications  aspirin chewable tablet 324 mg (324 mg Oral Given 05/19/22 2342)    _______________________________________________________ ER Provider Called:   Cardiology   Dr.Eugwe They Recommend admit to medicine   Will see in AM      ED Triage Vitals  Enc Vitals Group     BP 05/19/22 2146 (!) 118/36     Pulse Rate 05/19/22 2146 61     Resp 05/19/22 2146 20     Temp 05/19/22  2146 99.2 F (37.3 C)     Temp Source 05/19/22 2146 Oral     SpO2 05/19/22 2146 100 %     Weight --      Height --      Head Circumference --      Peak Flow --      Pain Score 05/19/22 2145 0     Pain Loc --      Pain Edu? --      Excl. in [place]? --   TMAX(24)@     _________________________________________ Significant initial  Findings: Abnormal Labs Reviewed  BASIC METABOLIC PANEL - Abnormal; Notable for the following components:      Result Value   Chloride 95 (*)    Glucose, Bld 246 (*)    Creatinine, Ser 3.47 (*)    Calcium 8.5 (*)    GFR, Estimated 13 (*)    All other components within normal limits  TROPONIN I (HIGH SENSITIVITY) - Abnormal; Notable for the following components:   Troponin I (High Sensitivity) 44 (*)    All other components within normal limits      _________________ Troponin   Cardiac Panel (last 3 results) Recent Labs    05/19/22 2150  TROPONINIHS 44*    ECG: Ordered Personally reviewed and interpreted by me showing: HR : 58 Rhythm: Sinus rhythm Nonspecific IVCD with LAD Anterolateral infarct, age indeterminate QTC 608  BNP (last 3 results) Recent Labs    04/13/22 1943  BNP 973.4*    The recent clinical data is shown below. Vitals:   05/19/22 2146 05/19/22 2200 05/19/22 2245  BP: (!) 118/36 (!) 112/37 (!) 103/34  Pulse: 61 (!) 57 (!) 52  Resp: 20 20 15   Temp: 99.2 F (37.3 C)    TempSrc: Oral    SpO2: 100% 100% 100%    WBC     Component Value Date/Time   WBC 9.1 05/19/2022 2150   LYMPHSABS 1.4 04/13/2022 1943   MONOABS 0.9 04/13/2022 1943   EOSABS 0.1 04/13/2022 1943   BASOSABS 0.1 04/13/2022 1943       __________________________________________________________ Recent Labs  Lab 05/19/22 2150  NA 140  K 4.3  CO2 32  GLUCOSE 246*  BUN 8  CREATININE 3.47*  CALCIUM 8.5*    Cr   stable,   Lab Results  Component Value Date   CREATININE 3.47 (H) 05/19/2022   CREATININE 5.84 (H) 04/13/2022   CREATININE 6.69 (  H)  01/19/2022    No results for input(s): "AST", "ALT", "ALKPHOS", "BILITOT", "PROT", "ALBUMIN" in the last 168 hours. Lab Results  Component Value Date   CALCIUM 8.5 (L) 05/19/2022   PHOS 2.4 (L) 03/05/2021          Plt: Lab Results  Component Value Date   PLT 265 05/19/2022         Recent Labs  Lab 05/19/22 2150  WBC 9.1  HGB 12.3  HCT 39.5  MCV 97.8  PLT 265    HG/HCT * stable,  Down *Up from baseline see below    Component Value Date/Time   HGB 12.3 05/19/2022 2150   HCT 39.5 05/19/2022 2150   MCV 97.8 05/19/2022 2150      No results for input(s): "LIPASE", "AMYLASE" in the last 168 hours. No results for input(s): "AMMONIA" in the last 168 hours.    .lab  _______________________________________________ Hospitalist was called for admission for *** There are no diagnoses linked to this encounter.   The following Work up has been ordered so far:  Orders Placed This Encounter  Procedures  . DG Chest 2 View  . Basic metabolic panel  . CBC  . Document Height and Actual Weight  . Inpatient consult to Cardiology  . Consult to hospitalist  . Consult for [facility] Admission  . EKG 12-Lead  . ED EKG     OTHER Significant initial  Findings:  labs showing:     DM  labs:  HbA1C: No results for input(s): "HGBA1C" in the last 8760 hours.     CBG (last 3)  No results for input(s): "GLUCAP" in the last 72 hours.        Cultures:    Component Value Date/Time   SDES BLOOD LEFT HAND 05/02/2021 0201   SPECREQUEST  05/02/2021 0201    BOTTLES DRAWN AEROBIC AND ANAEROBIC Blood Culture results may not be optimal due to an inadequate volume of blood received in culture bottles   CULT  05/02/2021 0201    NO GROWTH 5 DAYS Performed at [facility], [place] [address], [place], [place] [address]    REPTSTATUS 05/07/2021 FINAL 05/02/2021 0201     Radiological Exams on Admission: DG Chest 2 View  Result Date: 05/19/2022 CLINICAL DATA:  Chest  pain. EXAM: CHEST - 2 VIEW COMPARISON:  April 13, 2022 FINDINGS: The cardiac silhouette is mildly enlarged and unchanged in size. Mild, chronic appearing diffusely increased interstitial lung markings are seen. Mild, stable lingular linear atelectasis is also noted. There is no evidence of a pleural effusion or pneumothorax. The visualized skeletal structures are unremarkable. IMPRESSION: 1. Chronic appearing increased interstitial lung markings with mild, stable lingular linear atelectasis. 2. A superimposed component of mild interstitial edema cannot be excluded. Electronically Signed   By: Virgina Norfolk M.D.   On: 05/19/2022 22:23   _______________________________________________________________________________________________________ Latest  Blood pressure (!) 103/34, pulse (!) 52, temperature 99.2 F (37.3 C), temperature source Oral, resp. rate 15, SpO2 100 %.   Vitals  labs and radiology finding personally reviewed  Review of Systems:    Pertinent positives include: ***  Constitutional:  No weight loss, night sweats, Fevers, chills, fatigue, weight loss  HEENT:  No headaches, Difficulty swallowing,Tooth/dental problems,Sore throat,  No sneezing, itching, ear ache, nasal congestion, post nasal drip,  Cardio-vascular:  No chest pain, Orthopnea, PND, anasarca, dizziness, palpitations.no Bilateral lower extremity swelling  GI:  No heartburn, indigestion, abdominal pain, nausea, vomiting, diarrhea, change in bowel  habits, loss of appetite, melena, blood in stool, hematemesis Resp:  no shortness of breath at rest. No dyspnea on exertion, No excess mucus, no productive cough, No non-productive cough, No coughing up of blood.No change in color of mucus.No wheezing. Skin:  no rash or lesions. No jaundice GU:  no dysuria, change in color of urine, no urgency or frequency. No straining to urinate.  No flank pain.  Musculoskeletal:  No joint pain or no joint swelling. No decreased range  of motion. No back pain.  Psych:  No change in mood or affect. No depression or anxiety. No memory loss.  Neuro: no localizing neurological complaints, no tingling, no weakness, no double vision, no gait abnormality, no slurred speech, no confusion  All systems reviewed and apart from [place] all are negative _______________________________________________________________________________________________ Past Medical History:   Past Medical History:  Diagnosis Date  . Arthritis   . Asthma   . Atrial fibrillation ([place])   . CHF (congestive heart failure) ([place])   . CKD (chronic kidney disease), stage III ([place])   . COPD (chronic obstructive pulmonary disease) ([place])   . Diabetes mellitus    INSULIN DEPENDENT  . Dialysis patient ([place])   . Gout   . Heart murmur    no issues per pt  . History of kidney stones   . Hyperlipemia   . Hypertension   . Psoriasis   . Renal disorder    congenital  . Single kidney   . Sleep apnea    doesn't use the Cpap      Past Surgical History:  Procedure Laterality Date  . A/V FISTULAGRAM Left 10/31/2020   Procedure: A/V FISTULAGRAM;  Surgeon: Angelia Mould, MD;  Location: [facility];  Service: Cardiovascular;  Laterality: Left;  . ABDOMINAL HYSTERECTOMY    . AV FISTULA PLACEMENT Left 11/17/2018   Procedure: BRACHIO-CEPHALIC ARTERIOVENOUS (AV) FISTULA CREATION IN LEFT ARM;  Surgeon: Marty Heck, MD;  Location: [facility];  Service: Vascular;  Laterality: Left;  . CARDIOVERSION N/A 03/03/2021   Procedure: CARDIOVERSION;  Surgeon: Larey Dresser, MD;  Location: [facility] ENDOSCOPY;  Service: Cardiovascular;  Laterality: N/A;  . CARDIOVERSION N/A 03/12/2021   Procedure: CARDIOVERSION;  Surgeon: Larey Dresser, MD;  Location: [facility]OSCOPY;  Service: Cardiovascular;  Laterality: N/A;  . CHOLECYSTECTOMY    . INSERTION OF DIALYSIS CATHETER Right 01/26/2019   Procedure: INSERTION OF DIALYSIS CATHETER, right internal jugular;  Surgeon: Angelia Mould, MD;  Location: [facility];  Service: Vascular;  Laterality: Right;  . LEFT AND RIGHT HEART CATHETERIZATION WITH CORONARY ANGIOGRAM N/A 11/29/2013   Procedure: LEFT AND RIGHT HEART CATHETERIZATION WITH CORONARY ANGIOGRAM;  Surgeon: Jacolyn Reedy, MD;  Location: [facility] CATH LAB;  Service: Cardiovascular;  Laterality: N/A;  . LEFT HEART CATH AND CORONARY ANGIOGRAPHY N/A 02/24/2021   Procedure: LEFT HEART CATH AND CORONARY ANGIOGRAPHY;  Surgeon: Larey Dresser, MD;  Location: [facility];  Service: Cardiovascular;  Laterality: N/A;  . PERIPHERAL VASCULAR INTERVENTION Left 10/31/2020   Procedure: PERIPHERAL VASCULAR INTERVENTION;  Surgeon: Angelia Mould, MD;  Location: [facility];  Service: Cardiovascular;  Laterality: Left;  . RIGHT HEART CATH N/A 11/23/2017   Procedure: RIGHT HEART CATH;  Surgeon: Larey Dresser, MD;  Location: [facility];  Service: Cardiovascular;  Laterality: N/A;  . RIGHT/LEFT HEART CATH AND CORONARY ANGIOGRAPHY N/A 06/15/2019   Procedure: RIGHT/LEFT HEART CATH AND CORONARY ANGIOGRAPHY;  Surgeon: Larey Dresser, MD;  Location:  [facility] INVASIVE CV LAB;  Service: Cardiovascular;  Laterality: N/A;    Social History:  Ambulatory *** independently cane, walker  wheelchair bound, bed bound     reports that she quit smoking about 13 years ago. Her smoking use included cigarettes. She has a 20.00 pack-year smoking history. She has never used smokeless tobacco. She reports current alcohol use. She reports that she does not currently use drugs.     Family History: *** Family History  Problem Relation Age of Onset  . Lupus Daughter   . Cancer Mother 26       type unknown  . CVA Father 75  . Cancer Brother 45       type unknown   ______________________________________________________________________________________________ Allergies: Allergies  Allergen Reactions  . Egg-Derived Products Nausea And Vomiting  . Lisinopril Nausea And Vomiting   . Penicillins Nausea And Vomiting    Has patient had a PCN reaction causing immediate rash, facial/tongue/throat swelling, SOB or lightheadedness with hypotension: No Has patient had a PCN reaction causing severe rash involving mucus membranes or skin necrosis: No Has patient had a PCN reaction that required hospitalization: No Has patient had a PCN reaction occurring within the last 10 years: No If all of the above answers are "NO", then may proceed with Cephalosporin use.   . Other Other (See Comments)     Prior to Admission medications   Medication Sig Start Date End Date Taking? Authorizing Provider  acetaminophen (TYLENOL) 500 MG tablet Take 1,000 mg by mouth in the morning and at bedtime.   Yes [provider]  albuterol (PROVENTIL HFA;VENTOLIN HFA) 108 (90 BASE) MCG/ACT inhaler Inhale 2 puffs into the lungs in the morning and at bedtime.   Yes [provider]  amiodarone (PACERONE) 200 MG tablet Take 200 mg by mouth daily.   Yes [provider]  atorvastatin (LIPITOR) 80 MG tablet Take 80 mg by mouth at bedtime.   Yes [provider]  budesonide-formoterol (SYMBICORT) 160-4.5 MCG/ACT inhaler Inhale 2 puffs into the lungs 2 (two) times daily. 04/09/20  Yes Collene Gobble, MD  Cholecalciferol (VITAMIN D3) 25 MCG (1000 UT) tablet Take 1,000 Units by mouth daily.   Yes [provider]  cyanocobalamin (VITAMIN B12) 500 MCG tablet Take 500 mcg by mouth daily.   Yes [provider]  diphenhydrAMINE (BENADRYL) 25 mg capsule Take 25 mg by mouth every 6 (six) hours as needed for itching.   Yes [provider]  donepezil (ARICEPT) 5 MG tablet Take 1 tablet (5 mg total) by mouth at bedtime. Patient taking differently: Take 5 mg by mouth daily. 04/27/22  Yes Camara, Maryan Puls, MD  ELIQUIS 5 MG TABS tablet Take 1 tablet by mouth twice daily Patient taking differently: Take 5 mg by mouth 2 (two) times daily. 07/23/20  Yes Larey Dresser, MD   insulin glargine (SEMGLEE) 100 UNIT/ML injection Inject 16 Units into the skin at bedtime.   Yes [provider]  latanoprost (XALATAN) 0.005 % ophthalmic solution Place 1 drop into both eyes at bedtime.   Yes [provider]  lidocaine (LINDAMANTLE) 3 % CREA cream Apply 1 application topically every Monday, Wednesday, and Friday. Prior to dialysis 03/06/20  Yes [provider]  Lidocaine 4 % PTCH Place 1 patch onto the skin daily as needed (for pain). 1/2 patch applied to shoulder or back 11/12/20  Yes [provider]  Menthol, Topical Analgesic, (BIOFREEZE EX) Apply 1 application  topically in the morning and  at bedtime.   Yes [provider]  midodrine (PROAMATINE) 10 MG tablet Take 10 mg by mouth 3 (three) times a week. Taken before dialysis on Monday, Wednesday, Friday Hold if SBP>130   Yes [provider]  pantoprazole (PROTONIX) 40 MG tablet Take 40 mg by mouth at bedtime. 10/26/18  Yes [provider]  Pramoxine-Calamine (AVEENO ANTI-ITCH EX) Apply 1 application topically every 6 (six) hours as needed (itching).   Yes [provider]  traZODone (DESYREL) 50 MG tablet Take 25 mg by mouth at bedtime.   Yes [provider]  Insulin Pen Needle 32G X 4 MM MISC 1 Device by Does not apply route in the morning, at noon, in the evening, and at bedtime. 02/06/20   Shamleffer, Melanie Crazier, MD  propranolol (INDERAL) 10 MG tablet Take 1 tablet (10 mg total) by mouth 2 (two) times daily. Patient not taking: Reported on 05/19/2022 04/15/22 05/15/22  Alric Ran, MD    ___________________________________________________________________________________________________ Physical Exam:    05/19/2022   10:45 PM 05/19/2022   10:00 PM 05/19/2022    9:46 PM  Vitals with BMI  Systolic [id number]  Diastolic 34 37 36  Pulse 52 57 61     1. General:  in No ***Acute distress***increased work of breathing ***complaining of severe  pain****agitated * Chronically ill *well *cachectic *toxic acutely ill -appearing 2. Psychological: Alert and *** Oriented 3. Head/ENT:   Moist *** Dry Mucous Membranes                          Head Non traumatic, neck supple                          Normal *** Poor Dentition 4. SKIN: normal *** decreased Skin turgor,  Skin clean Dry and intact no rash @IMAGES @  5. Heart: Regular rate and rhythm no*** Murmur, no Rub or gallop 6. Lungs: ***Clear to auscultation bilaterally, no wheezes or crackles   7. Abdomen: Soft, ***non-tender, Non distended *** obese ***bowel sounds present 8. Lower extremities: no clubbing, cyanosis, no ***edema 9. Neurologically Grossly intact, moving all 4 extremities equally *** strength 5 out of 5 in all 4 extremities cranial nerves II through XII intact 10. MSK: Normal range of motion    Chart has been reviewed  ______________________________________________________________________________________________  Assessment/Plan  ***  Admitted for *** There are no diagnoses linked to this encounter.   Present on Admission: **None**     No problem-specific Assessment & Plan notes found for this encounter.    Other plan as per orders.  DVT prophylaxis:  SCD *** Lovenox       Code Status:    Code Status: Prior FULL CODE *** DNR/DNI ***comfort care as per patient ***family  I had personally discussed CODE STATUS with patient and family* I had spent *min discussing goals of care and CODE STATUS ACP has been reviewed ***   Family Communication:   Family not at  Bedside  plan of care was discussed on the phone with *** Son, Daughter, Wife, Husband, Sister, Brother , father, mother  Disposition Plan:   *** likely will need placement for rehabilitation                          Back to current facility when stable  To home once workup is complete and patient is stable  ***Following barriers for discharge:                             Electrolytes corrected                               Anemia corrected                             Pain controlled with PO medications                               Afebrile, white count improving able to transition to PO antibiotics                             Will need to be able to tolerate PO                            Will likely need home health, home O2, set up                           Will need consultants to evaluate patient prior to discharge  ****EXPECT DC tomorrow                    ***Would benefit from PT/OT eval prior to DC  Ordered                   Swallow eval - SLP ordered                   Diabetes care coordinator                   Transition of care consulted                   Nutrition    consulted                  Wound care  consulted                   Palliative care    consulted                   Behavioral health  consulted                    Consults called: ***    Admission status:  ED Disposition     ED Disposition  Admit   Condition  --   Comment  The patient appears reasonably stabilized for admission considering the current resources, flow, and capabilities available in the ED at this time, and I doubt any other [facility] requiring further screening and/or treatment in the ED prior to admission is  present.           Obs***  ***  inpatient     I Expect 2 midnight stay secondary to severity of patient's current illness need for inpatient interventions justified by the following: ***hemodynamic instability despite optimal treatment (tachycardia *hypotension * tachypnea *hypoxia, hypercapnia) * Severe lab/radiological/exam abnormalities including:     and extensive comorbidities including: *substance abuse  *  Chronic pain *DM2  * CHF * CAD  * COPD/asthma *Morbid Obesity * CKD *dementia *liver disease *history of stroke with residual deficits *  malignancy, * sickle cell disease  History of amputation Chronic  anticoagulation  That are currently affecting medical management.   I expect  patient to be hospitalized for 2 midnights requiring inpatient medical care.  Patient is at high risk for adverse outcome (such as loss of life or disability) if not treated.  Indication for inpatient stay as follows:  Severe change from baseline regarding mental status Hemodynamic instability despite maximal medical therapy,  ongoing suicidal ideations,  severe pain requiring acute inpatient management,  inability to maintain oral hydration   persistent chest pain despite medical management Need for operative/procedural  intervention New or worsening hypoxia   Need for IV antibiotics, IV fluids, IV rate controling medications, IV antihypertensives, IV pain medications, IV anticoagulation, need for biPAP    Level of care   *** tele  For 12H 24H     medical floor       progressive tele indefinitely please discontinue once patient no longer qualifies COVID-19 Labs    Lab Results  Component Value Date   [place] NEGATIVE 05/11/2021     Precautions: admitted as *** Covid Negative  ***asymptomatic screening protocol****PUI *** covid positive No active isolations ***If Covid PCR is negative  - please DC precautions - would need additional investigation given very high risk for false native test result    Critical***  Patient is critically ill due to  hemodynamic instability * respiratory failure *severe sepsis* ongoing chest pain*  They are at high risk for life/limb threatening clinical deterioration requiring frequent reassessment and modifications of care.  Services provided include examination of the patient, review of relevant ancillary tests, prescription of lifesaving therapies, review of medications and prophylactic therapy.  Total critical care time excluding separately billable procedures: 60*  Minutes.    Stephanie Woodward 05/19/2022, 11:46 PM ***  [facility]     after 2 AM  please page floor coverage PA If 7AM-7PM, please contact the day team taking care of the patient using [email or web address]

## 2022-05-19 NOTE — Subjective & Objective (Signed)
Patient is hemodialysis patient she has had chest pain intermittent all day and therefore could not finish her treatment she is not chest pain-free She usually gets hemodialysis on Monday Wednesday and Friday

## 2022-05-19 NOTE — ED Triage Notes (Signed)
Chest pain intermittently all day. Had dialysis today and did get all of her treatment. She is now chest pain free. EMS did not give aspirin or any treatments.

## 2022-05-20 ENCOUNTER — Observation Stay (HOSPITAL_BASED_OUTPATIENT_CLINIC_OR_DEPARTMENT_OTHER): Payer: Medicare (Managed Care)

## 2022-05-20 ENCOUNTER — Other Ambulatory Visit: Payer: Self-pay

## 2022-05-20 DIAGNOSIS — I132 Hypertensive heart and chronic kidney disease with heart failure and with stage 5 chronic kidney disease, or end stage renal disease: Secondary | ICD-10-CM | POA: Diagnosis not present

## 2022-05-20 DIAGNOSIS — Z992 Dependence on renal dialysis: Secondary | ICD-10-CM | POA: Diagnosis not present

## 2022-05-20 DIAGNOSIS — R7989 Other specified abnormal findings of blood chemistry: Secondary | ICD-10-CM | POA: Diagnosis present

## 2022-05-20 DIAGNOSIS — R079 Chest pain, unspecified: Secondary | ICD-10-CM | POA: Diagnosis not present

## 2022-05-20 DIAGNOSIS — N186 End stage renal disease: Secondary | ICD-10-CM | POA: Diagnosis not present

## 2022-05-20 DIAGNOSIS — F0390 Unspecified dementia without behavioral disturbance: Secondary | ICD-10-CM | POA: Diagnosis present

## 2022-05-20 DIAGNOSIS — I959 Hypotension, unspecified: Secondary | ICD-10-CM

## 2022-05-20 DIAGNOSIS — R0789 Other chest pain: Secondary | ICD-10-CM | POA: Diagnosis not present

## 2022-05-20 DIAGNOSIS — Z794 Long term (current) use of insulin: Secondary | ICD-10-CM

## 2022-05-20 DIAGNOSIS — E1121 Type 2 diabetes mellitus with diabetic nephropathy: Secondary | ICD-10-CM

## 2022-05-20 DIAGNOSIS — R9431 Abnormal electrocardiogram [ECG] [EKG]: Secondary | ICD-10-CM | POA: Diagnosis present

## 2022-05-20 LAB — HEPATIC FUNCTION PANEL
ALT: 12 U/L (ref 0–44)
AST: 14 U/L — ABNORMAL LOW (ref 15–41)
Albumin: 2.4 g/dL — ABNORMAL LOW (ref 3.5–5.0)
Alkaline Phosphatase: 110 U/L (ref 38–126)
Bilirubin, Direct: 0.1 mg/dL (ref 0.0–0.2)
Indirect Bilirubin: 0.4 mg/dL (ref 0.3–0.9)
Total Bilirubin: 0.5 mg/dL (ref 0.3–1.2)
Total Protein: 5.9 g/dL — ABNORMAL LOW (ref 6.5–8.1)

## 2022-05-20 LAB — GLUCOSE, CAPILLARY
Glucose-Capillary: 126 mg/dL — ABNORMAL HIGH (ref 70–99)
Glucose-Capillary: 242 mg/dL — ABNORMAL HIGH (ref 70–99)
Glucose-Capillary: 201 mg/dL — ABNORMAL HIGH (ref 70–99)

## 2022-05-20 LAB — COMPREHENSIVE METABOLIC PANEL
ALT: 11 U/L (ref 0–44)
AST: 15 U/L (ref 15–41)
Albumin: 2.5 g/dL — ABNORMAL LOW (ref 3.5–5.0)
Alkaline Phosphatase: 104 U/L (ref 38–126)
Anion gap: 13 (ref 5–15)
BUN: 11 mg/dL (ref 8–23)
CO2: 32 mmol/L (ref 22–32)
Calcium: 8.6 mg/dL — ABNORMAL LOW (ref 8.9–10.3)
Chloride: 97 mmol/L — ABNORMAL LOW (ref 98–111)
Creatinine, Ser: 3.9 mg/dL — ABNORMAL HIGH (ref 0.44–1.00)
GFR, Estimated: 11 mL/min — ABNORMAL LOW (ref >60)
Glucose, Bld: 134 mg/dL — ABNORMAL HIGH (ref 70–99)
Potassium: 4 mmol/L (ref 3.5–5.1)
Sodium: 142 mmol/L (ref 135–145)
Total Bilirubin: 1 mg/dL (ref 0.3–1.2)
Total Protein: 6.4 g/dL — ABNORMAL LOW (ref 6.5–8.1)

## 2022-05-20 LAB — HEPATITIS B SURFACE ANTIGEN: Hepatitis B Surface Ag: NONREACTIVE

## 2022-05-20 LAB — CBC
HCT: 33.5 % — ABNORMAL LOW (ref 36.0–46.0)
Hemoglobin: 10.4 g/dL — ABNORMAL LOW (ref 12.0–15.0)
MCH: 30.4 pg (ref 26.0–34.0)
MCHC: 31 g/dL (ref 30.0–36.0)
MCV: 98 fL (ref 80.0–100.0)
Platelets: 255 10*3/uL (ref 150–400)
RBC: 3.42 MIL/uL — ABNORMAL LOW (ref 3.87–5.11)
RDW: 15.4 % (ref 11.5–15.5)
WBC: 8.3 10*3/uL (ref 4.0–10.5)
nRBC: 0 % (ref 0.0–0.2)

## 2022-05-20 LAB — MAGNESIUM: Magnesium: 2.2 mg/dL (ref 1.7–2.4)

## 2022-05-20 LAB — CBC WITH DIFFERENTIAL/PLATELET
Abs Immature Granulocytes: 0.03 10*3/uL (ref 0.00–0.07)
Basophils Absolute: 0.1 10*3/uL (ref 0.0–0.1)
Basophils Relative: 1 %
Eosinophils Absolute: 0.2 10*3/uL (ref 0.0–0.5)
Eosinophils Relative: 2 %
HCT: 33.3 % — ABNORMAL LOW (ref 36.0–46.0)
Hemoglobin: 10.5 g/dL — ABNORMAL LOW (ref 12.0–15.0)
Immature Granulocytes: 0 %
Lymphocytes Relative: 19 %
Lymphs Abs: 1.6 10*3/uL (ref 0.7–4.0)
MCH: 30.2 pg (ref 26.0–34.0)
MCHC: 31.5 g/dL (ref 30.0–36.0)
MCV: 95.7 fL (ref 80.0–100.0)
Monocytes Absolute: 0.9 10*3/uL (ref 0.1–1.0)
Monocytes Relative: 11 %
Neutro Abs: 5.4 10*3/uL (ref 1.7–7.7)
Neutrophils Relative %: 67 %
Platelets: 232 10*3/uL (ref 150–400)
RBC: 3.48 MIL/uL — ABNORMAL LOW (ref 3.87–5.11)
RDW: 15.3 % (ref 11.5–15.5)
WBC: 8 10*3/uL (ref 4.0–10.5)
nRBC: 0 % (ref 0.0–0.2)

## 2022-05-20 LAB — TROPONIN I (HIGH SENSITIVITY)
Troponin I (High Sensitivity): 34 ng/L — ABNORMAL HIGH (ref <18)
Troponin I (High Sensitivity): 42 ng/L — ABNORMAL HIGH (ref <18)
Troponin I (High Sensitivity): 45 ng/L — ABNORMAL HIGH (ref <18)

## 2022-05-20 LAB — CBG MONITORING, ED
Glucose-Capillary: 116 mg/dL — ABNORMAL HIGH (ref 70–99)
Glucose-Capillary: 247 mg/dL — ABNORMAL HIGH (ref 70–99)

## 2022-05-20 LAB — ECHOCARDIOGRAM COMPLETE
Area-P 1/2: 2.77 cm2
Height: 62 in
S' Lateral: 3.6 cm
Weight: 2758.4 oz

## 2022-05-20 LAB — CK: Total CK: 47 U/L (ref 38–234)

## 2022-05-20 LAB — PHOSPHORUS: Phosphorus: 3.2 mg/dL (ref 2.5–4.6)

## 2022-05-20 LAB — TSH: TSH: 1.709 u[IU]/mL (ref 0.350–4.500)

## 2022-05-20 LAB — PREALBUMIN: Prealbumin: 24 mg/dL (ref 18–38)

## 2022-05-20 MED ORDER — PANTOPRAZOLE SODIUM 40 MG PO TBEC
40.0000 mg | DELAYED_RELEASE_TABLET | Freq: Every day | ORAL | Status: DC
  Administered 2022-05-20: 40 mg via ORAL
  Filled 2022-05-20: qty 1

## 2022-05-20 MED ORDER — APIXABAN 5 MG PO TABS
5.0000 mg | ORAL_TABLET | Freq: Two times a day (BID) | ORAL | Status: DC
  Administered 2022-05-20 (×2): 5 mg via ORAL
  Filled 2022-05-20 (×2): qty 1

## 2022-05-20 MED ORDER — FENTANYL CITRATE PF 50 MCG/ML IJ SOSY
12.5000 ug | PREFILLED_SYRINGE | INTRAMUSCULAR | Status: DC | PRN
  Administered 2022-05-20 (×2): 50 ug via INTRAVENOUS
  Filled 2022-05-20 (×2): qty 1

## 2022-05-20 MED ORDER — SODIUM CHLORIDE 0.9% FLUSH
3.0000 mL | Freq: Two times a day (BID) | INTRAVENOUS | Status: DC
  Administered 2022-05-20: 3 mL via INTRAVENOUS

## 2022-05-20 MED ORDER — PANTOPRAZOLE SODIUM 40 MG PO TBEC: 40.0000 mg | DELAYED_RELEASE_TABLET | Freq: Two times a day (BID) | ORAL | Status: DC

## 2022-05-20 MED ORDER — DONEPEZIL HCL 5 MG PO TABS
5.0000 mg | ORAL_TABLET | Freq: Every day | ORAL | Status: DC
  Administered 2022-05-20: 5 mg via ORAL
  Filled 2022-05-20: qty 1

## 2022-05-20 MED ORDER — ATORVASTATIN CALCIUM 80 MG PO TABS
80.0000 mg | ORAL_TABLET | Freq: Every day | ORAL | Status: DC
  Administered 2022-05-20: 80 mg via ORAL
  Filled 2022-05-20: qty 2

## 2022-05-20 MED ORDER — MIDODRINE HCL 5 MG PO TABS
10.0000 mg | ORAL_TABLET | Freq: Three times a day (TID) | ORAL | Status: DC | PRN
  Administered 2022-05-20: 10 mg via ORAL
  Filled 2022-05-20: qty 2

## 2022-05-20 MED ORDER — INSULIN ASPART 100 UNIT/ML IJ SOLN
0.0000 [IU] | INTRAMUSCULAR | Status: DC
  Administered 2022-05-20: 2 [IU] via SUBCUTANEOUS
  Administered 2022-05-20: 5 [IU] via SUBCUTANEOUS
  Administered 2022-05-20 (×2): 3 [IU] via SUBCUTANEOUS

## 2022-05-20 MED ORDER — MOMETASONE FURO-FORMOTEROL FUM 200-5 MCG/ACT IN AERO
2.0000 | INHALATION_SPRAY | Freq: Two times a day (BID) | RESPIRATORY_TRACT | Status: DC
  Administered 2022-05-20: 2 via RESPIRATORY_TRACT
  Filled 2022-05-20: qty 8.8

## 2022-05-20 MED ORDER — MIDODRINE HCL 5 MG PO TABS: 5.0000 mg | ORAL_TABLET | Freq: Three times a day (TID) | ORAL | Status: DC | PRN

## 2022-05-20 MED ORDER — SODIUM CHLORIDE 0.9% FLUSH: 3.0000 mL | INTRAVENOUS | Status: DC | PRN

## 2022-05-20 MED ORDER — HYDROCODONE-ACETAMINOPHEN 5-325 MG PO TABS
1.0000 | ORAL_TABLET | ORAL | Status: DC | PRN
  Filled 2022-05-20: qty 1

## 2022-05-20 MED ORDER — AMIODARONE HCL 200 MG PO TABS
200.0000 mg | ORAL_TABLET | Freq: Every day | ORAL | Status: DC
  Administered 2022-05-20: 200 mg via ORAL
  Filled 2022-05-20: qty 1

## 2022-05-20 MED ORDER — DOXERCALCIFEROL 4 MCG/2ML IV SOLN: 8.0000 ug | INTRAVENOUS | Status: DC

## 2022-05-20 MED ORDER — ACETAMINOPHEN 650 MG RE SUPP: 650.0000 mg | Freq: Four times a day (QID) | RECTAL | Status: DC | PRN

## 2022-05-20 MED ORDER — SODIUM CHLORIDE 0.9 % IV SOLN: 250.0000 mL | INTRAVENOUS | Status: DC | PRN

## 2022-05-20 MED ORDER — INSULIN GLARGINE-YFGN 100 UNIT/ML ~~LOC~~ SOLN
12.0000 [IU] | Freq: Every day | SUBCUTANEOUS | Status: DC
  Administered 2022-05-20: 12 [IU] via SUBCUTANEOUS
  Filled 2022-05-20 (×2): qty 0.12

## 2022-05-20 MED ORDER — CHLORHEXIDINE GLUCONATE CLOTH 2 % EX PADS
6.0000 | MEDICATED_PAD | Freq: Every day | CUTANEOUS | Status: DC
  Administered 2022-05-20: 6 via TOPICAL

## 2022-05-20 MED ORDER — LATANOPROST 0.005 % OP SOLN
1.0000 [drp] | Freq: Every day | OPHTHALMIC | Status: DC
  Filled 2022-05-20: qty 2.5

## 2022-05-20 MED ORDER — ACETAMINOPHEN 325 MG PO TABS
650.0000 mg | ORAL_TABLET | Freq: Four times a day (QID) | ORAL | Status: DC | PRN
  Administered 2022-05-20: 650 mg via ORAL
  Filled 2022-05-20: qty 2

## 2022-05-20 MED ORDER — ALBUTEROL SULFATE (2.5 MG/3ML) 0.083% IN NEBU: 2.5000 mg | INHALATION_SOLUTION | RESPIRATORY_TRACT | Status: DC | PRN

## 2022-05-20 MED ORDER — ALBUTEROL SULFATE HFA 108 (90 BASE) MCG/ACT IN AERS: 2.0000 | INHALATION_SPRAY | RESPIRATORY_TRACT | Status: DC | PRN

## 2022-05-20 MED ORDER — TRAZODONE HCL 50 MG PO TABS
25.0000 mg | ORAL_TABLET | Freq: Every day | ORAL | Status: DC
  Administered 2022-05-20: 25 mg via ORAL
  Filled 2022-05-20: qty 1

## 2022-05-20 NOTE — Progress Notes (Signed)
  Echocardiogram 2D Echocardiogram has been performed.  Wynelle Link 05/20/2022, 2:57 PM

## 2022-05-20 NOTE — Assessment & Plan Note (Signed)
Patient received hemodialysis on Wednesday, 05/19/2022 but was not complete will notify nephrology the patient is being admitted may need early dialysis at this point no indication for emergent dialysis

## 2022-05-20 NOTE — Assessment & Plan Note (Signed)
Monitor for any sundowning continue Aricept 5 mg a day

## 2022-05-20 NOTE — Assessment & Plan Note (Signed)
-   will monitor on tele avoid QT prolonging medications, rehydrate correct electrolytes  

## 2022-05-20 NOTE — TOC Transition Note (Signed)
Transition of Care [facility]) - CM/SW Discharge Note   Patient Details  Name: Stephanie Woodward MRN: [id number] Date of Birth: 1940-03-25  Transition of Care [facility]) CM/SW Contact:  Tom-Johnson, Renea Ee, RN Phone Number: 05/20/2022, 4:32 PM   Clinical Narrative:     Patient is scheduled for discharge today. Outpatient referrals, hospital f/u and discharge instructions on AVS.  No TOC needs or recommendations noted.   Family to transport at discharge. No further TOC needs noted.   Final next level of care: Home/Self Care Barriers to Discharge: Barriers Resolved   Patient Goals and CMS Choice CMS Medicare.gov Compare Post Acute Care list provided to:: Patient Choice offered to / list presented to : NA  Discharge Placement                  Patient to be transferred to facility by: Family      Discharge Plan and Services Additional resources added to the After Visit Summary for                  DME Arranged: N/A DME Agency: NA       HH Arranged: NA HH Agency: NA        Social Determinants of Health (SDOH) Interventions SDOH Screenings   Food Insecurity: No Food Insecurity (05/20/2022)  Housing: Low Risk  (05/20/2022)  Transportation Needs: No Transportation Needs (05/20/2022)  Utilities: Not At Risk (05/20/2022)  Depression (PHQ2-9): Low Risk  (12/03/2019)  Financial Resource Strain: Low Risk  (02/27/2021)  Tobacco Use: Medium Risk (05/19/2022)     Readmission Risk Interventions     No data to display

## 2022-05-20 NOTE — Assessment & Plan Note (Signed)
Continue amiodarone 200 mg daily continue Eliquis 5 mg p.o. twice a day

## 2022-05-20 NOTE — Discharge Summary (Signed)
Physician Discharge Summary   Patient: Stephanie Woodward MRN: [id number] DOB: 03/28/1940  Admit date:     05/19/2022  Discharge date: 05/20/22  Discharge Physician: Stephanie Woodward   PCP: Stephanie Ran, MD   Recommendations at discharge:   Follow up with PCP/PACE. Due to concern for hypotension/bradycardia, we recommend to continue holding propranolol and hold aricept. Consider evaluation for GI causes of chest/epigastric discomfort. Increased protonix to BID as a 2 weeks trial at discharge.   Discharge Diagnoses: Active Problems:   Hypotension   ESRD on hemodialysis (HCC)   Type 2 diabetes mellitus, controlled, with renal complications (HCC)   Paroxysmal atrial fibrillation (HCC)   Hyperlipidemia   Chest pain   Prolonged QT interval   Dementia without behavioral disturbance ([place])   Elevated troponin  Hospital Course: Stephanie Woodward is a 82 y.o. female with a history of ESRD, HFpEF, HTN, nonobstructive CAD, GERD, PAF on eliquis, dementia, HLD, IDT2DM who presented to the ED on 05/19/2022 after a couple episodes of chest pain. Workup revealed mildly elevated troponin without ST elevations by ECG, and chest pain resolved spontaneously. Cardiology was consulted by EDP, recommended observation with troponin trending for atypical chest pain, and echocardiogram which  showed no regional wall motion abnormalities.   Assessment and Plan: Atypical chest pain, nonobstructive CAD by coronary CTA w/FFR Nov 2023, mild troponin elevation with flat/downward trend and resolution of symptoms: With nonexertional pain and hx GERD, GI etiology is considered.  - Cardiology consulted, recommended no further work up if echocardiogram is reassuring. Echo shows no RWMA, preserved LVSF. Pt will follow up with Dr. Aundra Woodward in this case. - Continue statin, eliquis - Continue PPI, augmented dose.   ESRD: On HD MWF.  - Nephrology consulted in the event that patient requires hemodialysis as an inpatient. Some volume up,  interstitial coarsening on CXR may be an element of pulmonary interstitial edema, though she has no respiratory symptoms/hypoxia. No urgent HD needs. Will pursue routine HD 3/29 as outpatient since cardiac work up reassuring and symptoms resolved.    Hypotension:  - Continue midodrine. No evidence of acute bleeding, dehydration/volume depletion, or sepsis.  - Holding propranolol already, with bradycardia into high 40's, low 50's, recommend holding aricept as well at this time, though defer to outpatient providers going forward.    Sinus bradycardia, PAF: ECG was abnormal with NSR and no RWP, IVCD, though no ischemic ST deviations. - Continue amiodarone, s/p DCCV 03/03/2021 - Continue eliquis (CHA2DS2-VASc score is 7)   IDT2DM:   - Continue home regimen   Essential tremor: Followed by neurology - Propranolol may have been recommended but is not being given due to concern for bradycardia/hypotension.  - Neurology f/u recommended.    COPD: Quiescent.  - Continue controller, prn    Dementia: Mild. Holding aric ept as discussed above.    Glaucoma: Quiescent - Continue gtt's   Obesity: Body mass index is 31.53 kg/m  Consultants: Cardiology Procedures performed: Echo  Disposition: Home Diet recommendation:  Discharge Diet Orders (From admission, onward)     Start     Ordered   05/20/22 0000  Diet - low sodium heart healthy        05/20/22 1609           DISCHARGE MEDICATION: Allergies as of 05/20/2022       Reactions   Egg-derived Products Nausea And Vomiting   Lisinopril Nausea And Vomiting   Penicillins Nausea And Vomiting   Has patient had a PCN reaction causing  immediate rash, facial/tongue/throat swelling, SOB or lightheadedness with hypotension: No Has patient had a PCN reaction causing severe rash involving mucus membranes or skin necrosis: No Has patient had a PCN reaction that required hospitalization: No Has patient had a PCN reaction occurring within the last 10  years: No If all of the above answers are "NO", then may proceed with Cephalosporin use.   Other Other (See Comments)        Medication List     STOP taking these medications    donepezil 5 MG tablet Commonly known as: ARICEPT   propranolol 10 MG tablet Commonly known as: INDERAL       TAKE these medications    acetaminophen 500 MG tablet Commonly known as: TYLENOL Take 1,000 mg by mouth in the morning and at bedtime.   albuterol 108 (90 Base) MCG/ACT inhaler Commonly known as: VENTOLIN HFA Inhale 2 puffs into the lungs in the morning and at bedtime.   amiodarone 200 MG tablet Commonly known as: PACERONE Take 200 mg by mouth daily.   atorvastatin 80 MG tablet Commonly known as: LIPITOR Take 80 mg by mouth at bedtime.   AVEENO ANTI-ITCH EX Apply 1 application topically every 6 (six) hours as needed (itching).   BIOFREEZE EX Apply 1 application  topically in the morning and at bedtime.   budesonide-formoterol 160-4.5 MCG/ACT inhaler Commonly known as: SYMBICORT Inhale 2 puffs into the lungs 2 (two) times daily.   cyanocobalamin 500 MCG tablet Commonly known as: VITAMIN B12 Take 500 mcg by mouth daily.   diphenhydrAMINE 25 mg capsule Commonly known as: BENADRYL Take 25 mg by mouth every 6 (six) hours as needed for itching.   Eliquis 5 MG Tabs tablet Generic drug: apixaban Take 1 tablet by mouth twice daily What changed: how much to take   Insulin Pen Needle 32G X 4 MM Misc 1 Device by Does not apply route in the morning, at noon, in the evening, and at bedtime.   latanoprost 0.005 % ophthalmic solution Commonly known as: XALATAN Place 1 drop into both eyes at bedtime.   lidocaine 3 % Crea cream Commonly known as: LINDAMANTLE Apply 1 application topically every Monday, Wednesday, and Friday. Prior to dialysis   lidocaine 4 % Place 1 patch onto the skin daily as needed (for pain). 1/2 patch applied to shoulder or back   midodrine 10 MG  tablet Commonly known as: PROAMATINE Take 10 mg by mouth 3 (three) times a week. Taken before dialysis on Monday, Wednesday, Friday Hold if SBP>130   pantoprazole 40 MG tablet Commonly known as: PROTONIX Take 1 tablet (40 mg total) by mouth 2 (two) times daily. What changed: when to take this   Semglee 100 UNIT/ML injection Generic drug: insulin glargine Inject 16 Units into the skin at bedtime.   traZODone 50 MG tablet Commonly known as: DESYREL Take 25 mg by mouth at bedtime.   vitamin D3 25 MCG (1000 UT) tablet Generic drug: Cholecalciferol Take 1,000 Units by mouth daily.        Follow-up Information     Larey Dresser, MD Follow up.   Specialty: Cardiology Contact information: [id number] [place] [address] [phone]                Discharge Exam: Danley Danker Weights   05/20/22 0027 05/20/22 [id number]  Weight: 83 kg 78.2 kg  BP (!) 147/42 (BP Location: Right Arm)   Pulse (!) 52   Temp 98.1 F (  36.7 C) (Oral)   Resp 20   Ht 5\' 2"  (1.575 m)   Wt 78.2 kg   SpO2 100%   BMI 31.53 kg/m   Gen: No distress, pleasant Pulm: Clear, nonlabored  CV: RRR, no MRG, trace dependent edema GI: Soft, NT, ND, +BS  Neuro: Alert and interactive, cooperative. No new focal deficits. Ext: Warm, no deformities. Skin: No rashes, lesions or ulcers on visualized skin   Condition at discharge: stable  The results of significant diagnostics from this hospitalization (including imaging, microbiology, ancillary and laboratory) are listed below for reference.   Imaging Studies: ECHOCARDIOGRAM COMPLETE  Result Date: 05/20/2022    ECHOCARDIOGRAM REPORT   Patient Name:   LENNAN WESELY Date of Exam: 05/20/2022 Medical Rec #:  [id number]      Height:       62.0 in Accession #:    [id number]     Weight:       172.4 lb Date of Birth:  [birth year]     BSA:          1.795 m Patient Age:    68 years       BP:           123/59 mmHg Patient Gender: F              HR:            51 bpm. Exam Location:  Inpatient Procedure: 2D Echo, Cardiac Doppler and Color Doppler Indications:    Chest Pain R07.9  History:        Patient has prior history of Echocardiogram examinations, most                 recent 05/02/2021. CHF, COPD, Arrythmias:Atrial Fibrillation,                 Signs/Symptoms:Hypotension, Chest Pain, Altered Mental Status                 and Murmur; Risk Factors:Diabetes, Dyslipidemia, Former Smoker,                 Hypertension and Sleep Apnea.  Sonographer:    Greer Pickerel Referring Phys: [id number] Stephanie Woodward  Sonographer Comments: Image acquisition challenging due to patient body habitus and Image acquisition challenging due to respiratory motion. IMPRESSIONS  1. Left ventricular ejection fraction, by estimation, is 60 to 65%. The left ventricle has normal function. The left ventricle has no regional wall motion abnormalities. Left ventricular diastolic parameters are consistent with Grade III diastolic dysfunction (restrictive). Elevated left ventricular end-diastolic pressure.  2. Right ventricular systolic function is mildly reduced. The right ventricular size is normal. There is normal pulmonary artery systolic pressure. The estimated right ventricular systolic pressure is Q000111Q mmHg.  3. Left atrial size was moderately dilated.  4. The mitral valve is degenerative. Trivial mitral valve regurgitation. No evidence of mitral stenosis.  5. The aortic valve is normal in structure. Aortic valve regurgitation is not visualized. No aortic stenosis is present.  6. The inferior vena cava is normal in size with greater than 50% respiratory variability, suggesting right atrial pressure of 3 mmHg. FINDINGS  Left Ventricle: Left ventricular ejection fraction, by estimation, is 60 to 65%. The left ventricle has normal function. The left ventricle has no regional wall motion abnormalities. The left ventricular internal cavity size was normal in size. There is  no left ventricular  hypertrophy. Left ventricular diastolic parameters are consistent with Grade III diastolic dysfunction (restrictive). Elevated left  ventricular end-diastolic pressure. Right Ventricle: The right ventricular size is normal. No increase in right ventricular wall thickness. Right ventricular systolic function is mildly reduced. There is normal pulmonary artery systolic pressure. The tricuspid regurgitant velocity is 2.51 m/s, and with an assumed right atrial pressure of 3 mmHg, the estimated right ventricular systolic pressure is Q000111Q mmHg. Left Atrium: Left atrial size was moderately dilated. Right Atrium: Right atrial size was normal in size. Pericardium: There is no evidence of pericardial effusion. Mitral Valve: The mitral valve is degenerative in appearance. There is moderate thickening of the mitral valve leaflet(s). There is mild calcification of the mitral valve leaflet(s). Mild to moderate mitral annular calcification. Trivial mitral valve regurgitation. No evidence of mitral valve stenosis. Tricuspid Valve: The tricuspid valve is normal in structure. Tricuspid valve regurgitation is trivial. No evidence of tricuspid stenosis. Aortic Valve: The aortic valve is normal in structure. Aortic valve regurgitation is not visualized. No aortic stenosis is present. Pulmonic Valve: The pulmonic valve was normal in structure. Pulmonic valve regurgitation is trivial. No evidence of pulmonic stenosis. Aorta: The aortic root is normal in size and structure. Venous: The inferior vena cava is normal in size with greater than 50% respiratory variability, suggesting right atrial pressure of 3 mmHg. IAS/Shunts: No atrial level shunt detected by color flow Doppler.  LEFT VENTRICLE PLAX 2D LVIDd:         4.90 cm   Diastology LVIDs:         3.60 cm   LV e' medial:    3.94 cm/s LV PW:         1.50 cm   LV E/e' medial:  32.2 LV IVS:        0.70 cm   LV e' lateral:   4.60 cm/s LVOT diam:     1.80 cm   LV E/e' lateral: 27.6 LV SV:          56 LV SV Index:   31 LVOT Area:     2.54 cm  RIGHT VENTRICLE RV S prime:     11.60 cm/s TAPSE (M-mode): 1.6 cm LEFT ATRIUM             Index        RIGHT ATRIUM           Index LA diam:        3.50 cm 1.95 cm/m   RA Area:     16.00 cm LA Vol (A2C):   75.3 ml 41.96 ml/m  RA Volume:   35.80 ml  19.95 ml/m LA Vol (A4C):   73.2 ml 40.79 ml/m LA Biplane Vol: 82.0 ml 45.69 ml/m  AORTIC VALVE             PULMONIC VALVE LVOT Vmax:   87.10 cm/s  PR End Diast Vel: 1.74 msec LVOT Vmean:  57.200 cm/s LVOT VTI:    0.219 m  AORTA Ao Root diam: 2.70 cm Ao Asc diam:  2.90 cm MITRAL VALVE                TRICUSPID VALVE MV Area (PHT): 2.77 cm     TR Peak grad:   25.2 mmHg MV Decel Time: 274 msec     TR Vmax:        251.00 cm/s MV E velocity: 127.00 cm/s MV A velocity: 41.60 cm/s   SHUNTS MV E/A ratio:  3.05         Systemic VTI:  0.22 m  Systemic Diam: 1.80 cm Fransico Him MD Electronically signed by Fransico Him MD Signature Date/Time: 05/20/2022/3:51:06 PM    Final    DG Chest 2 View  Result Date: 05/19/2022 CLINICAL DATA:  Chest pain. EXAM: CHEST - 2 VIEW COMPARISON:  April 13, 2022 FINDINGS: The cardiac silhouette is mildly enlarged and unchanged in size. Mild, chronic appearing diffusely increased interstitial lung markings are seen. Mild, stable lingular linear atelectasis is also noted. There is no evidence of a pleural effusion or pneumothorax. The visualized skeletal structures are unremarkable. IMPRESSION: 1. Chronic appearing increased interstitial lung markings with mild, stable lingular linear atelectasis. 2. A superimposed component of mild interstitial edema cannot be excluded. Electronically Signed   By: Virgina Norfolk M.D.   On: 05/19/2022 22:23    Microbiology: Results for orders placed or performed during the hospital encounter of 05/11/21  Resp Panel by RT-PCR (Flu A&B, Covid) Nasopharyngeal Swab     Status: None   Collection Time: 05/11/21  5:31 PM   Specimen:  Nasopharyngeal Swab; Nasopharyngeal(NP) swabs in vial transport medium  Result Value Ref Range Status   SARS Coronavirus 2 by RT PCR NEGATIVE NEGATIVE Final    Comment: (NOTE) SARS-CoV-2 target nucleic acids are NOT DETECTED.  The SARS-CoV-2 RNA is generally detectable in upper respiratory specimens during the acute phase of infection. The lowest concentration of SARS-CoV-2 viral copies this assay can detect is 138 copies/mL. A negative result does not preclude SARS-Cov-2 infection and should not be used as the sole basis for treatment or other patient management decisions. A negative result may occur with  improper specimen collection/handling, submission of specimen other than nasopharyngeal swab, presence of viral mutation(s) within the areas targeted by this assay, and inadequate number of viral copies(<138 copies/mL). A negative result must be combined with clinical observations, patient history, and epidemiological information. The expected result is Negative.  Fact Sheet for Patients:  EntrepreneurPulse.com.au  Fact Sheet for Healthcare Providers:  IncredibleEmployment.be  This test is no t yet approved or cleared by the [place] FDA and  has been authorized for detection and/or diagnosis of SARS-CoV-2 by FDA under an Emergency Use Authorization (EUA). This EUA will remain  in effect (meaning this test can be used) for the duration of the COVID-19 declaration under Section 564(b)(1) of the Act, 21 U.S.C.section 360bbb-3(b)(1), unless the authorization is terminated  or revoked sooner.       Influenza A by PCR NEGATIVE NEGATIVE Final   Influenza B by PCR NEGATIVE NEGATIVE Final    Comment: (NOTE) The Xpert Xpress SARS-CoV-2/FLU/RSV plus assay is intended as an aid in the diagnosis of influenza from Nasopharyngeal swab specimens and should not be used as a sole basis for treatment. Nasal washings and aspirates are unacceptable for  Xpert Xpress SARS-CoV-2/FLU/RSV testing.  Fact Sheet for Patients: EntrepreneurPulse.com.au  Fact Sheet for Healthcare Providers: IncredibleEmployment.be  This test is not yet approved or cleared by the [place] FDA and has been authorized for detection and/or diagnosis of SARS-CoV-2 by FDA under an Emergency Use Authorization (EUA). This EUA will remain in effect (meaning this test can be used) for the duration of the COVID-19 declaration under Section 564(b)(1) of the Act, 21 U.S.C. section 360bbb-3(b)(1), unless the authorization is terminated or revoked.  Performed at [facility], [place] [address], [place], [place] [address]     Labs: CBC: Recent Labs  Lab 05/19/22 2150 05/20/22 0235 05/20/22 0651  WBC 9.1 8.3 8.0  NEUTROABS  --   --  5.4  HGB 12.3 10.4* 10.5*  HCT 39.5 33.5* 33.3*  MCV 97.8 98.0 95.7  PLT 265 255 [id number]   Basic Metabolic Panel: Recent Labs  Lab 05/19/22 2150 05/20/22 0235  NA 140 142  K 4.3 4.0  CL 95* 97*  CO2 32 32  GLUCOSE 246* 134*  BUN 8 11  CREATININE 3.47* 3.90*  CALCIUM 8.5* 8.6*  MG  --  2.2  PHOS  --  3.2   Liver Function Tests: Recent Labs  Lab 05/20/22 0235 05/20/22 0651  AST 15 14*  ALT 11 12  ALKPHOS 104 110  BILITOT 1.0 0.5  PROT 6.4* 5.9*  ALBUMIN 2.5* 2.4*   CBG: Recent Labs  Lab 05/20/22 0032 05/20/22 0316 05/20/22 0730 05/20/22 1125 05/20/22 1604  GLUCAP 247* 116* 126* 242* 201*    Discharge time spent: greater than 30 minutes.  Signed: Patrecia Pour, MD [facility] 05/20/2022

## 2022-05-20 NOTE — Assessment & Plan Note (Signed)
Continue midodrine as needed for hemodialysis

## 2022-05-20 NOTE — Assessment & Plan Note (Signed)
Somewhat atypical continue to cycle cardiac enzymes echogram pneumonia appreciate cardiology consult EKG nonacute currently chest pain-free

## 2022-05-20 NOTE — Assessment & Plan Note (Signed)
Continue Lipitor 80 mg a day.

## 2022-05-20 NOTE — Progress Notes (Signed)
   05/20/22 1530  Spiritual Encounters  Type of Visit Initial  Care provided to: Pt and family  Referral source Patient request  Reason for visit Advance directives  OnCall Visit No  Interventions  Spiritual Care Interventions Made Established relationship of care and support;Compassionate presence  Advance Directives (For Healthcare)  Does Patient Have a Medical Advance Directive? No  Would patient like information on creating a medical advance directive? Yes (Inpatient - patient defers creating a medical advance directive at this time - Information given)   Chaplain visited with patient and daughter. Chaplain provided education and paperwork for advance directives. Patient and family are aware of how to request a chaplain to complete the paperwork.   Note Prepared by Abbott Pao, Chaplain Resident [phone]

## 2022-05-20 NOTE — Care Management Obs Status (Signed)
[facility] NOTIFICATION   Patient Details  Name: Stephanie Woodward MRN: [id number] Date of Birth: 02/20/1941   Medicare Observation Status Notification Given:  Yes    Tom-Johnson, Renea Ee, RN 05/20/2022, 3:42 PM

## 2022-05-20 NOTE — Progress Notes (Signed)
[facility] PROGRESS NOTE  Ladina Walmsley (DOB: 1940-12-02) [id number] PCP: Alric Ran, MD  Brief Narrative: Stephanie Woodward is a 82 y.o. female with a history of ESRD, HFpEF, HTN, nonobstructive CAD, GERD, PAF on eliquis, dementia, HLD, IDT2DM who presented to the ED on 05/19/2022 after a couple episodes of chest pain. Workup revealed mildly elevated troponin without ST elevations by ECG, and chest pain resolved spontaneously. Cardiology was consulted by EDP, recommended observation with troponin trending for atypical chest pain, and echocardiogram which is pending.   Subjective: Reports episodes of pain are "here" pointing just below xiphoid process and radiating up chest in midline, mild, dull. No cough and wasn't associated with breathing. Occurred first while sitting at dialysis, subsided and later similar episode for a few minutes abating spontaneously at home while seated. No anxiety reported.   Objective: BP (!) 123/39   Pulse (!) 53   Temp 98.1 F (36.7 C) (Oral)   Resp 20   Ht 5\' 2"  (1.575 m)   Wt 78.2 kg   SpO2 98%   BMI 31.53 kg/m   Gen: No distress, pleasant Pulm: Clear, nonlabored  CV: RRR, no MRG, trace dependent edema GI: Soft, NT, ND, +BS  Neuro: Alert and interactive, cooperative. No new focal deficits. Ext: Warm, no deformities. Skin: No rashes, lesions or ulcers on visualized skin   Assessment & Plan: Active Problems:   Hypotension   ESRD on hemodialysis ([place])   Type 2 diabetes mellitus, controlled, with renal complications (HCC)   Paroxysmal atrial fibrillation (HCC)   Hyperlipidemia   Chest pain   Prolonged QT interval   Dementia without behavioral disturbance (HCC)   Elevated troponin  Atypical chest pain, nonobstructive CAD by coronary CTA w/FFR Nov 2023, mild troponin elevation with flat/downward trend and resolution of symptoms: With nonexertional pain and hx GERD, GI etiology is considered.  - Cardiology consulted, recommended no  further work up if echocardiogram is reassuring. Pt will follow up with Dr. Aundra Dubin in this case. Otherwise, would need cardiology consultation.  - Continue statin, eliquis - Continue PPI, augment dose.  ESRD: On HD MWF.  - Nephrology consulted in the event that patient requires hemodialysis as an inpatient. Some volume up, interstitial coarsening on CXR may be an element of pulmonary interstitial edema, though she has no respiratory symptoms/hypoxia. No urgent HD needs. Hoping to pursue routine HD 3/29 as outpatient if cardiac work up reassuring and symptoms resolved.   Hypotension:  - Continue midodrine. No evidence of acute bleeding, dehydration/volume depletion, or sepsis.   Sinus bradycardia, PAF: ECG was abnormal with NSR and no RWP, IVCD, though no ischemic ST deviations. - Continue amiodarone, s/p DCCV 03/03/2021 - Continue eliquis (CHA2DS2-VASc score is 7)  T2DM:   - Continue glargine + sensitive SSI  Essential tremor: Followed by neurology - Propranolol may have been recommended but is not being given due to concern for bradycardia/hypotension.  - Neurology f/u recommended.   COPD: Quiescent.  - Continue controller, prn   Dementia: Mild.  - Continue aricept 5mg  for now. May need to hold if bradycardia is felt to be contributing to hypotension.  Glaucoma: Quiescent - Continue gtt's  Obesity: Body mass index is 31.53 kg/m.   Patrecia Pour, MD [facility] [email or web address] 05/20/2022, 2:06 PM

## 2022-05-20 NOTE — Assessment & Plan Note (Addendum)
Order sliding scale Cont Semglee decrease to 12 units qhs

## 2022-05-20 NOTE — ED Notes (Signed)
ED TO INPATIENT HANDOFF REPORT  ED Nurse Name and Phone #: Randall Hiss [phone]  S Name/Age/Gender Stephanie Woodward 82 y.o. female Room/Bed: 043C/043C  Code Status   Code Status: Full Code  Home/SNF/Other Home Patient oriented to: self, place, time, and situation Is this baseline? Yes   Triage Complete: Triage complete  Chief Complaint Chest pain [R07.9]  Triage Note Chest pain intermittently all day. Had dialysis today and did get all of her treatment. She is now chest pain free. EMS did not give aspirin or any treatments.    Allergies Allergies  Allergen Reactions   Egg-Derived Products Nausea And Vomiting   Lisinopril Nausea And Vomiting   Penicillins Nausea And Vomiting    Has patient had a PCN reaction causing immediate rash, facial/tongue/throat swelling, SOB or lightheadedness with hypotension: No Has patient had a PCN reaction causing severe rash involving mucus membranes or skin necrosis: No Has patient had a PCN reaction that required hospitalization: No Has patient had a PCN reaction occurring within the last 10 years: No If all of the above answers are "NO", then may proceed with Cephalosporin use.    Other Other (See Comments)    Level of Care/Admitting Diagnosis ED Disposition     ED Disposition  Admit   Condition  --   [place]: [place] [[id number]]  Level of Care: Progressive [102]  Admit to Progressive based on following criteria: CARDIOVASCULAR & THORACIC of moderate stability with acute coronary syndrome symptoms/low risk myocardial infarction/hypertensive urgency/arrhythmias/heart failure potentially compromising stability and stable post cardiovascular intervention patients.  May place patient in observation at [facility] or [place] if equivalent level of care is available:: No  Covid Evaluation: Asymptomatic - no recent exposure (last 10 days) testing not required  Diagnosis: Chest pain [id number]  Admitting Physician:  [place], [place]  Attending Physician: Toy Baker [[id number]]          B Medical/Surgery History Past Medical History:  Diagnosis Date   Arthritis    Asthma    Atrial fibrillation ([place])    CHF (congestive heart failure) ([place])    CKD (chronic kidney disease), stage III ([place])    COPD (chronic obstructive pulmonary disease) ([place])    Diabetes mellitus    INSULIN DEPENDENT   Dialysis patient ([place])    Gout    Heart murmur    no issues per pt   History of kidney stones    Hyperlipemia    Hypertension    Psoriasis    Renal disorder    congenital   Single kidney    Sleep apnea    doesn't use the Cpap   Past Surgical History:  Procedure Laterality Date   A/V FISTULAGRAM Left 10/31/2020   Procedure: A/V FISTULAGRAM;  Surgeon: Angelia Mould, MD;  Location: [facility];  Service: Cardiovascular;  Laterality: Left;   ABDOMINAL HYSTERECTOMY     AV FISTULA PLACEMENT Left 11/17/2018   Procedure: BRACHIO-CEPHALIC ARTERIOVENOUS (AV) FISTULA CREATION IN LEFT ARM;  Surgeon: Marty Heck, MD;  Location: [facility];  Service: Vascular;  Laterality: Left;   CARDIOVERSION N/A 03/03/2021   Procedure: CARDIOVERSION;  Surgeon: Larey Dresser, MD;  Location[facility] ENDOSCOPY;  Service: Cardiovascular;  Laterality: N/A;   CARDIOVERSION N/A 03/12/2021   Procedure: CARDIOVERSION;  Surgeon: Larey Dresser, MD;  Location: [facility]COPY;  Service: Cardiovascular;  Laterality: N/A;   CHOLECYSTECTOMY     INSERTION OF DIALYSIS CATHETER Right 01/26/2019   Procedure: INSERTION  OF DIALYSIS CATHETER, right internal jugular;  Surgeon: Angelia Mould, MD;  Location: [facility];  Service: Vascular;  Laterality: Right;   LEFT AND RIGHT HEART CATHETERIZATION WITH CORONARY ANGIOGRAM N/A 11/29/2013   Procedure: LEFT AND RIGHT HEART CATHETERIZATION WITH CORONARY ANGIOGRAM;  Surgeon: Jacolyn Reedy, MD;  Location: [facility] CATH LAB;  Service: Cardiovascular;  Laterality: N/A;   LEFT HEART CATH  AND CORONARY ANGIOGRAPHY N/A 02/24/2021   Procedure: LEFT HEART CATH AND CORONARY ANGIOGRAPHY;  Surgeon: Larey Dresser, MD;  Location: [facility];  Service: Cardiovascular;  Laterality: N/A;   PERIPHERAL VASCULAR INTERVENTION Left 10/31/2020   Procedure: PERIPHERAL VASCULAR INTERVENTION;  Surgeon: Angelia Mould, MD;  Location: [place] [facility];  Service: Cardiovascular;  Laterality: Left;   RIGHT HEART CATH N/A 11/23/2017   Procedure: RIGHT HEART CATH;  Surgeon: Larey Dresser, MD;  Location: [place];  Service: Cardiovascular;  Laterality: N/A;   RIGHT/LEFT HEART CATH AND CORONARY ANGIOGRAPHY N/A 06/15/2019   Procedure: RIGHT/LEFT HEART CATH AND CORONARY ANGIOGRAPHY;  Surgeon: Larey Dresser, MD;  Location: [place] [facility];  Service: Cardiovascular;  Laterality: N/A;     A IV Location/Drains/Wounds Patient Lines/Drains/Airways Status     Active Line/Drains/Airways     Name Placement date Placement time Site Days   Peripheral IV 05/19/22 20 G Right Antecubital 05/19/22  2145  Antecubital  1   Fistula / Graft Left Upper arm Arteriovenous fistula 11/17/18  1120  Upper arm  1280   Fistula / Graft Left Upper arm 05/01/21  2318  Upper arm  384   Pressure Injury 05/01/21 Coccyx Right Stage 2 -  Partial thickness loss of dermis presenting as a shallow open injury with a red, pink wound bed without slough. [id number] 05/01/21  2318  -- 384            Intake/Output Last 24 hours No intake or output data in the 24 hours ending 05/20/22 0457  Labs/Imaging Results for orders placed or performed during the hospital encounter of 05/19/22 (from the past 48 hour(s))  Basic metabolic panel     Status: Abnormal   Collection Time: 05/19/22  9:50 PM  Result Value Ref Range   Sodium 140 135 - 145 mmol/L   Potassium 4.3 3.5 - 5.1 mmol/L   Chloride 95 (L) 98 - 111 mmol/L   CO2 32 22 - 32 mmol/L   Glucose, Bld 246 (H) 70 - 99 mg/dL    Comment: Glucose reference range  applies only to samples taken after fasting for at least 8 hours.   BUN 8 8 - 23 mg/dL   Creatinine, Ser 3.47 (H) 0.44 - 1.00 mg/dL   Calcium 8.5 (L) 8.9 - 10.3 mg/dL   GFR, Estimated 13 (L) >60 mL/min    Comment: (NOTE) Calculated using the CKD-EPI Creatinine Equation (2021)    Anion gap 13 5 - 15    Comment: Performed at [facility] [address], [place] [address]  CBC     Status: None   Collection Time: 05/19/22  9:50 PM  Result Value Ref Range   WBC 9.1 4.0 - 10.5 K/uL   RBC 4.04 3.87 - 5.11 MIL/uL   Hemoglobin 12.3 12.0 - 15.0 g/dL   HCT 39.5 36.0 - 46.0 %   MCV 97.8 80.0 - 100.0 fL   MCH 30.4 26.0 - 34.0 pg   MCHC 31.1 30.0 - 36.0 g/dL   RDW 15.2 11.5 -  15.5 %   Platelets 265 150 - 400 K/uL   nRBC 0.0 0.0 - 0.2 %    Comment: Performed at [facility], [place] [address] [place], [place] [address]  Troponin I (High Sensitivity)     Status: Abnormal   Collection Time: 05/19/22  9:50 PM  Result Value Ref Range   Troponin I (High Sensitivity) 44 (H) <18 ng/L    Comment: (NOTE) Elevated high sensitivity troponin I (hsTnI) values and significant  changes across serial measurements may suggest ACS but many other  chronic and acute conditions are known to elevate hsTnI results.  Refer to the "Links" section for chest pain algorithms and additional  guidance. Performed at [facility], [place] [address] [place], [place] [address]   Troponin I (High Sensitivity)     Status: Abnormal   Collection Time: 05/19/22 11:47 PM  Result Value Ref Range   Troponin I (High Sensitivity) 34 (H) <18 ng/L    Comment: (NOTE) Elevated high sensitivity troponin I (hsTnI) values and significant  changes across serial measurements may suggest ACS but many other  chronic and acute conditions are known to elevate hsTnI results.  Refer to the "Links" section for chest pain algorithms and additional  guidance. Performed at [facility], [place] [address],  [place], [place] [address]   CBG monitoring, ED     Status: Abnormal   Collection Time: 05/20/22 12:32 AM  Result Value Ref Range   Glucose-Capillary 247 (H) 70 - 99 mg/dL    Comment: Glucose reference range applies only to samples taken after fasting for at least 8 hours.  Prealbumin     Status: None   Collection Time: 05/20/22  2:35 AM  Result Value Ref Range   Prealbumin 24 18 - 38 mg/dL    Comment: Performed at [facility] [address] [place], [place] [address]  Magnesium     Status: None   Collection Time: 05/20/22  2:35 AM  Result Value Ref Range   Magnesium 2.2 1.7 - 2.4 mg/dL    Comment: Performed at [facility] [address], [place], [place] [address]  Phosphorus     Status: None   Collection Time: 05/20/22  2:35 AM  Result Value Ref Range   Phosphorus 3.2 2.5 - 4.6 mg/dL    Comment: Performed at [facility] [address], [place], [place] [address]  Comprehensive metabolic panel     Status: Abnormal   Collection Time: 05/20/22  2:35 AM  Result Value Ref Range   Sodium 142 135 - 145 mmol/L   Potassium 4.0 3.5 - 5.1 mmol/L    Comment: HEMOLYSIS AT THIS LEVEL MAY AFFECT RESULT   Chloride 97 (L) 98 - 111 mmol/L   CO2 32 22 - 32 mmol/L   Glucose, Bld 134 (H) 70 - 99 mg/dL    Comment: Glucose reference range applies only to samples taken after fasting for at least 8 hours.   BUN 11 8 - 23 mg/dL   Creatinine, Ser 3.90 (H) 0.44 - 1.00 mg/dL   Calcium 8.6 (L) 8.9 - 10.3 mg/dL   Total Protein 6.4 (L) 6.5 - 8.1 g/dL   Albumin 2.5 (L) 3.5 - 5.0 g/dL   AST 15 15 - 41 U/L    Comment: HEMOLYSIS AT THIS LEVEL MAY AFFECT RESULT   ALT 11 0 - 44 U/L    Comment: HEMOLYSIS AT THIS LEVEL MAY AFFECT RESULT  Alkaline Phosphatase 104 38 - 126 U/L   Total Bilirubin 1.0 0.3 - 1.2 mg/dL    Comment: HEMOLYSIS AT THIS LEVEL MAY AFFECT RESULT   GFR, Estimated 11 (L) >60 mL/min    Comment: (NOTE) Calculated using the CKD-EPI Creatinine Equation (2021)    Anion gap  13 5 - 15    Comment: Performed at [facility] [address] [place], [place] [address]  CBC     Status: Abnormal   Collection Time: 05/20/22  2:35 AM  Result Value Ref Range   WBC 8.3 4.0 - 10.5 K/uL   RBC 3.42 (L) 3.87 - 5.11 MIL/uL   Hemoglobin 10.4 (L) 12.0 - 15.0 g/dL   HCT 33.5 (L) 36.0 - 46.0 %   MCV 98.0 80.0 - 100.0 fL   MCH 30.4 26.0 - 34.0 pg   MCHC 31.0 30.0 - 36.0 g/dL   RDW 15.4 11.5 - 15.5 %   Platelets 255 150 - 400 K/uL   nRBC 0.0 0.0 - 0.2 %    Comment: Performed at [facility], [place] [address] [place], [place] [address]  Troponin I (High Sensitivity)     Status: Abnormal   Collection Time: 05/20/22  2:35 AM  Result Value Ref Range   Troponin I (High Sensitivity) 42 (H) <18 ng/L    Comment: (NOTE) Elevated high sensitivity troponin I (hsTnI) values and significant  changes across serial measurements may suggest ACS but many other  chronic and acute conditions are known to elevate hsTnI results.  Refer to the "Links" section for chest pain algorithms and additional  guidance. Performed at [facility], [place] [address], [place],  [address]   CBG monitoring, ED     Status: Abnormal   Collection Time: 05/20/22  3:16 AM  Result Value Ref Range   Glucose-Capillary 116 (H) 70 - 99 mg/dL    Comment: Glucose reference range applies only to samples taken after fasting for at least 8 hours.   DG Chest 2 View  Result Date: 05/19/2022 CLINICAL DATA:  Chest pain. EXAM: CHEST - 2 VIEW COMPARISON:  April 13, 2022 FINDINGS: The cardiac silhouette is mildly enlarged and unchanged in size. Mild, chronic appearing diffusely increased interstitial lung markings are seen. Mild, stable lingular linear atelectasis is also noted. There is no evidence of a pleural effusion or pneumothorax. The visualized skeletal structures are unremarkable. IMPRESSION: 1. Chronic appearing increased interstitial lung markings with mild, stable lingular linear atelectasis.  2. A superimposed component of mild interstitial edema cannot be excluded. Electronically Signed   By: Virgina Norfolk M.D.   On: 05/19/2022 22:23    Pending Labs Unresulted Labs (From admission, onward)     Start     Ordered   05/20/22 0026  Hemoglobin A1c  Once,   R       Comments: To assess prior glycemic control    05/20/22 0025   05/19/22 2359  CK  Add-on,   AD        05/19/22 2359   05/19/22 2359  Magnesium  Add-on,   AD        05/19/22 2359   05/19/22 2359  Phosphorus  Add-on,   AD        05/19/22 2359   05/19/22 2359  CBC with Differential/Platelet  Add-on,   AD       Question:  Release to patient  Answer:  Immediate   05/19/22 2359   05/19/22 2359  Hepatic function panel  Add-on,   AD       Question:  Release to patient  Answer:  Immediate   05/19/22 2359   05/19/22 2359  TSH  Add-on,   AD        05/19/22 2359            Vitals/Pain Today's Vitals   05/20/22 0400 05/20/22 0407 05/20/22 0427 05/20/22 0451  BP: (!) 100/42     Pulse: (!) 52     Resp: 13     Temp:  98.3 F (36.8 C)    TempSrc:  Oral    SpO2: 100%     Weight:      Height:      PainSc:   0-No pain 0-No pain    Isolation Precautions No active isolations  Medications Medications  insulin aspart (novoLOG) injection 0-9 Units ( Subcutaneous Not Given 05/20/22 0318)  amiodarone (PACERONE) tablet 200 mg (has no administration in time range)  atorvastatin (LIPITOR) tablet 80 mg (80 mg Oral Given 05/20/22 0131)  mometasone-formoterol (DULERA) 200-5 MCG/ACT inhaler 2 puff (has no administration in time range)  donepezil (ARICEPT) tablet 5 mg (5 mg Oral Given 05/20/22 0126)  apixaban (ELIQUIS) tablet 5 mg (5 mg Oral Given 05/20/22 0126)  latanoprost (XALATAN) 0.005 % ophthalmic solution 1 drop (has no administration in time range)  pantoprazole (PROTONIX) EC tablet 40 mg (40 mg Oral Given 05/20/22 0130)  traZODone (DESYREL) tablet 25 mg (25 mg Oral Given 05/20/22 0130)  acetaminophen (TYLENOL) tablet  650 mg (650 mg Oral Given 05/20/22 0135)    Or  acetaminophen (TYLENOL) suppository 650 mg ( Rectal See Alternative 05/20/22 0135)  HYDROcodone-acetaminophen (NORCO/VICODIN) 5-325 MG per tablet 1-2 tablet (1 tablet Oral Not Given 05/20/22 0131)  sodium chloride flush (NS) 0.9 % injection 3 mL (has no administration in time range)  sodium chloride flush (NS) 0.9 % injection 3 mL (has no administration in time range)  0.9 %  sodium chloride infusion (has no administration in time range)  fentaNYL (SUBLIMAZE) injection 12.5-50 mcg (50 mcg Intravenous Given 05/20/22 0430)  insulin glargine-yfgn (SEMGLEE) injection 12 Units (12 Units Subcutaneous Given 05/20/22 0116)  albuterol (PROVENTIL) (2.5 MG/3ML) 0.083% nebulizer solution 2.5 mg (has no administration in time range)  aspirin chewable tablet 324 mg (324 mg Oral Given 05/19/22 2342)    Mobility power wheelchair     Focused Assessments Cardiac Assessment Handoff:    Lab Results  Component Value Date   CKTOTAL 100 01/24/2019   TROPONINI 0.20 ([place]) 04/09/2016   No results found for: "DDIMER" Does the Patient currently have chest pain? No    R Recommendations: See Admitting Provider Note  Report given to:   Additional Notes: diaylsis pt. Fistula left arm, head pain now relieved

## 2022-05-20 NOTE — Consult Note (Signed)
Stephanie Woodward Renal Consultation Note    Indication for Consultation:  Management of ESRD/hemodialysis, anemia, hypertension/volume, and secondary hyperparathyroidism.  HPI:  Stephanie Woodward is a 82 y.o. female with PMH including ESRD on dialysis, CHF, COPD, IDDM, HTN and HLD who presented to the ED with chest pain. She reports chest pain started yesterday and was intermittent throughout the day. She had about 30 minutes of chest pain during dialysis which eventually resolved, but then recurred prompting her ED visit. Reports she did have a lot more fluid than usual on this week and had noticed swelling of her lower extremities- noted she left 1.4kg over her EDW yesterday. Also had diarrhea 6 times on Tuesday, but no diarrhea or nausea since then. Denies any recent antibiotic use.  BP was initially soft on arrival. Hgb 10.5, K+ 4.0 BUN 11, Cr 3.9.  CXR with mild atelectasis and possible mild edema. At present, she denies CP, SOB, orthopnea, edema, dizziness, palpitations, fevers, chills, N/V/D.   Past Medical History:  Diagnosis Date   Arthritis    Asthma    Atrial fibrillation (HCC)    CHF (congestive heart failure) ([place])    CKD (chronic kidney disease), stage III (HCC)    COPD (chronic obstructive pulmonary disease) ([place])    Diabetes mellitus    INSULIN DEPENDENT   Dialysis patient ([place])    Gout    Heart murmur    no issues per pt   History of kidney stones    Hyperlipemia    Hypertension    Psoriasis    Renal disorder    congenital   Single kidney    Sleep apnea    doesn't use the Cpap   Past Surgical History:  Procedure Laterality Date   A/V FISTULAGRAM Left 10/31/2020   Procedure: A/V FISTULAGRAM;  Surgeon: Angelia Mould, MD;  Location: [place] [facility];  Service: Cardiovascular;  Laterality: Left;   ABDOMINAL HYSTERECTOMY     AV FISTULA PLACEMENT Left 11/17/2018   Procedure: BRACHIO-CEPHALIC ARTERIOVENOUS (AV) FISTULA CREATION IN LEFT ARM;  Surgeon:  Marty Heck, MD;  Location: [place];  Service: Vascular;  Laterality: Left;   CARDIOVERSION N/A 03/03/2021   Procedure: CARDIOVERSION;  Surgeon: Larey Dresser, MD;  Location: [facility] ENDOSCOPY;  Service: Cardiovascular;  Laterality: N/A;   CARDIOVERSION N/A 03/12/2021   Procedure: CARDIOVERSION;  Surgeon: Larey Dresser, MD;  Location: [facility] ENDOSCOPY;  Service: Cardiovascular;  Laterality: N/A;   CHOLECYSTECTOMY     INSERTION OF DIALYSIS CATHETER Right 01/26/2019   Procedure: INSERTION OF DIALYSIS CATHETER, right internal jugular;  Surgeon: Angelia Mould, MD;  Location: [place];  Service: Vascular;  Laterality: Right;   LEFT AND RIGHT HEART CATHETERIZATION WITH CORONARY ANGIOGRAM N/A 11/29/2013   Procedure: LEFT AND RIGHT HEART CATHETERIZATION WITH CORONARY ANGIOGRAM;  Surgeon: Jacolyn Reedy, MD;  Location: [facility] CATH LAB;  Service: Cardiovascular;  Laterality: N/A;   LEFT HEART CATH AND CORONARY ANGIOGRAPHY N/A 02/24/2021   Procedure: LEFT HEART CATH AND CORONARY ANGIOGRAPHY;  Surgeon: Larey Dresser, MD;  Location: [place] [facility];  Service: Cardiovascular;  Laterality: N/A;   PERIPHERAL VASCULAR INTERVENTION Left 10/31/2020   Procedure: PERIPHERAL VASCULAR INTERVENTION;  Surgeon: Angelia Mould, MD;  Location: [place] [facility];  Service: Cardiovascular;  Laterality: Left;   RIGHT HEART CATH N/A 11/23/2017   Procedure: RIGHT HEART CATH;  Surgeon: Larey Dresser, MD;  Location: [place] [facility];  Service: Cardiovascular;  Laterality: N/A;   RIGHT/LEFT  HEART CATH AND CORONARY ANGIOGRAPHY N/A 06/15/2019   Procedure: RIGHT/LEFT HEART CATH AND CORONARY ANGIOGRAPHY;  Surgeon: Larey Dresser, MD;  Location: [place] [facility];  Service: Cardiovascular;  Laterality: N/A;   Family History  Problem Relation Age of Onset   Lupus Daughter    Cancer Mother 61       type unknown   CVA Father 3   Cancer Brother 40       type unknown   Social History:  reports that she quit  smoking about 13 years ago. Her smoking use included cigarettes. She has a 20.00 pack-year smoking history. She has never used smokeless tobacco. She reports current alcohol use. She reports that she does not currently use drugs.  ROS: As per HPI otherwise negative.  Review of Systems: Gen: Denies any fever, chills, fatigue, weakness, malaise, weight loss, and/or sleep disorders. HEENT: Denies blurred vision, sore throat or epistaxis. No sinusitis.   CV: Denies chest pain, angina, palpitations, orthopnea, PND, peripheral edema, and claudication. Resp: Denies dyspnea at rest, dyspnea with exercise, cough, sputum, or wheezing. GI: Denies nausea, vomiting, abdominal pain, constipation or diarrhea. GU: Denies dysuria or hematuria. MS: Denies joint pain, muscle pain or cramps.  Derm: Denies rashes, itching, or ulcerations. Psych: Denies depression and anxiety. Heme: Denies bruising, bleeding, and enlarged lymph nodes. Neuro: No headache, dizziness, or weakness. Endocrine: No hypoglycemia or thyroid disease.  Physical Exam: Vitals:   05/20/22 0407 05/20/22 0632 05/20/22 0728 05/20/22 0740  BP:  (!) 119/46 (!) 129/45   Pulse:  90 (!) 54 (!) 53  Resp:  19 18 18   Temp: 98.3 F (36.8 C) 98.4 F (36.9 C) 98.2 F (36.8 C)   TempSrc: Oral Oral Oral   SpO2:  100% 100% 100%  Weight:  78.2 kg    Height:  5\' 2"  (1.575 m)       General: Well developed, well nourished, in no acute distress. Head: Normocephalic, atraumatic, sclera non-icteric, mucus membranes are moist. Lungs: Clear bilaterally to auscultation without wheezes, rales, or rhonchi. Breathing is unlabored on RA Heart: RRR with normal S1, S2. No murmurs, rubs, or gallops appreciated. Abdomen: Soft, non-distended with normoactive bowel sounds. Musculoskeletal:  Strength and tone appear normal for age. Lower extremities: trace pedal edema b/l lower extremities, no open wounds. Neuro: Alert and oriented X 3. Moves all extremities  spontaneously. Psych:  Responds to questions appropriately with a normal affect. Dialysis Access:  Allergies  Allergen Reactions   Egg-Derived Products Nausea And Vomiting   Lisinopril Nausea And Vomiting   Penicillins Nausea And Vomiting    Has patient had a PCN reaction causing immediate rash, facial/tongue/throat swelling, SOB or lightheadedness with hypotension: No Has patient had a PCN reaction causing severe rash involving mucus membranes or skin necrosis: No Has patient had a PCN reaction that required hospitalization: No Has patient had a PCN reaction occurring within the last 10 years: No If all of the above answers are "NO", then may proceed with Cephalosporin use.    Other Other (See Comments)   Prior to Admission medications   Medication Sig Start Date End Date Taking? Authorizing Provider  acetaminophen (TYLENOL) 500 MG tablet Take 1,000 mg by mouth in the morning and at bedtime.   Yes [provider]  albuterol (PROVENTIL HFA;VENTOLIN HFA) 108 (90 BASE) MCG/ACT inhaler Inhale 2 puffs into the lungs in the morning and at bedtime.   Yes [provider]  amiodarone (PACERONE) 200 MG tablet Take 200 mg  by mouth daily.   Yes [provider]  atorvastatin (LIPITOR) 80 MG tablet Take 80 mg by mouth at bedtime.   Yes [provider]  budesonide-formoterol (SYMBICORT) 160-4.5 MCG/ACT inhaler Inhale 2 puffs into the lungs 2 (two) times daily. 04/09/20  Yes Collene Gobble, MD  Cholecalciferol (VITAMIN D3) 25 MCG (1000 UT) tablet Take 1,000 Units by mouth daily.   Yes [provider]  cyanocobalamin (VITAMIN B12) 500 MCG tablet Take 500 mcg by mouth daily.   Yes [provider]  diphenhydrAMINE (BENADRYL) 25 mg capsule Take 25 mg by mouth every 6 (six) hours as needed for itching.   Yes [provider]  donepezil (ARICEPT) 5 MG tablet Take 1 tablet (5 mg total) by mouth at bedtime. Patient taking differently: Take 5 mg by  mouth daily. 04/27/22  Yes Camara, Maryan Puls, MD  ELIQUIS 5 MG TABS tablet Take 1 tablet by mouth twice daily Patient taking differently: Take 5 mg by mouth 2 (two) times daily. 07/23/20  Yes Larey Dresser, MD  insulin glargine (SEMGLEE) 100 UNIT/ML injection Inject 16 Units into the skin at bedtime.   Yes [provider]  latanoprost (XALATAN) 0.005 % ophthalmic solution Place 1 drop into both eyes at bedtime.   Yes [provider]  lidocaine (LINDAMANTLE) 3 % CREA cream Apply 1 application topically every Monday, Wednesday, and Friday. Prior to dialysis 03/06/20  Yes [provider]  Lidocaine 4 % PTCH Place 1 patch onto the skin daily as needed (for pain). 1/2 patch applied to shoulder or back 11/12/20  Yes [provider]  Menthol, Topical Analgesic, (BIOFREEZE EX) Apply 1 application  topically in the morning and at bedtime.   Yes [provider]  midodrine (PROAMATINE) 10 MG tablet Take 10 mg by mouth 3 (three) times a week. Taken before dialysis on Monday, Wednesday, Friday Hold if SBP>130   Yes [provider]  pantoprazole (PROTONIX) 40 MG tablet Take 40 mg by mouth at bedtime. 10/26/18  Yes [provider]  Pramoxine-Calamine (AVEENO ANTI-ITCH EX) Apply 1 application topically every 6 (six) hours as needed (itching).   Yes [provider]  traZODone (DESYREL) 50 MG tablet Take 25 mg by mouth at bedtime.   Yes [provider]  Insulin Pen Needle 32G X 4 MM MISC 1 Device by Does not apply route in the morning, at noon, in the evening, and at bedtime. 02/06/20   Shamleffer, Melanie Crazier, MD  propranolol (INDERAL) 10 MG tablet Take 1 tablet (10 mg total) by mouth 2 (two) times daily. Patient not taking: Reported on 05/19/2022 04/15/22 05/15/22  Alric Ran, MD   Current Facility-Administered Medications  Medication Dose Route Frequency Provider Last Rate Last Admin   0.9 %  sodium chloride infusion  250 mL  Intravenous PRN Toy Baker, MD       acetaminophen (TYLENOL) tablet 650 mg  650 mg Oral Q6H PRN Toy Baker, MD   650 mg at 05/20/22 0135   Or   acetaminophen (TYLENOL) suppository 650 mg  650 mg Rectal Q6H PRN Doutova, Anastassia, MD       albuterol (PROVENTIL) (2.5 MG/3ML) 0.083% nebulizer solution 2.5 mg  2.5 mg Nebulization Q4H PRN Doutova, Anastassia, MD       amiodarone (PACERONE) tablet 200 mg  200 mg Oral Daily Doutova, Anastassia, MD   200 mg at 05/20/22 0931   apixaban (ELIQUIS) tablet 5 mg  5 mg Oral BID Toy Baker, MD  5 mg at 05/20/22 0931   atorvastatin (LIPITOR) tablet 80 mg  80 mg Oral QHS Toy Baker, MD   80 mg at 05/20/22 0131   donepezil (ARICEPT) tablet 5 mg  5 mg Oral QHS Doutova, Anastassia, MD   5 mg at 05/20/22 0126   fentaNYL (SUBLIMAZE) injection 12.5-50 mcg  12.5-50 mcg Intravenous Q2H PRN Toy Baker, MD   50 mcg at 05/20/22 0430   HYDROcodone-acetaminophen (NORCO/VICODIN) 5-325 MG per tablet 1-2 tablet  1-2 tablet Oral Q4H PRN Toy Baker, MD       insulin aspart (novoLOG) injection 0-9 Units  0-9 Units Subcutaneous Q4H Toy Baker, MD   2 Units at 05/20/22 0742   insulin glargine-yfgn (SEMGLEE) injection 12 Units  12 Units Subcutaneous QHS Toy Baker, MD   12 Units at 05/20/22 0116   latanoprost (XALATAN) 0.005 % ophthalmic solution 1 drop  1 drop Both Eyes QHS Doutova, Anastassia, MD       mometasone-formoterol (DULERA) 200-5 MCG/ACT inhaler 2 puff  2 puff Inhalation BID Toy Baker, MD   2 puff at 05/20/22 0740   pantoprazole (PROTONIX) EC tablet 40 mg  40 mg Oral QHS Doutova, Anastassia, MD   40 mg at 05/20/22 0130   sodium chloride flush (NS) 0.9 % injection 3 mL  3 mL Intravenous Q12H Doutova, Anastassia, MD   3 mL at 05/20/22 0931   sodium chloride flush (NS) 0.9 % injection 3 mL  3 mL Intravenous PRN Toy Baker, MD       traZODone (DESYREL) tablet 25 mg  25 mg Oral QHS Toy Baker, MD   25 mg at 05/20/22 0130   Labs: Basic Metabolic Panel: Recent Labs  Lab 05/19/22 2150 05/20/22 0235  NA 140 142  K 4.3 4.0  CL 95* 97*  CO2 32 32  GLUCOSE 246* 134*  BUN 8 11  CREATININE 3.47* 3.90*  CALCIUM 8.5* 8.6*  PHOS  --  3.2   Liver Function Tests: Recent Labs  Lab 05/20/22 0235 05/20/22 0651  AST 15 14*  ALT 11 12  ALKPHOS 104 110  BILITOT 1.0 0.5  PROT 6.4* 5.9*  ALBUMIN 2.5* 2.4*   No results for input(s): "LIPASE", "AMYLASE" in the last 168 hours. No results for input(s): "AMMONIA" in the last 168 hours. CBC: Recent Labs  Lab 05/19/22 2150 05/20/22 0235 05/20/22 0651  WBC 9.1 8.3 8.0  NEUTROABS  --   --  5.4  HGB 12.3 10.4* 10.5*  HCT 39.5 33.5* 33.3*  MCV 97.8 98.0 95.7  PLT 265 255 232   Cardiac Enzymes: Recent Labs  Lab 05/20/22 0651  CKTOTAL 47   CBG: Recent Labs  Lab 05/20/22 0032 05/20/22 0316 05/20/22 0730  GLUCAP 247* 116* 126*   Iron Studies: No results for input(s): "IRON", "TIBC", "TRANSFERRIN", "FERRITIN" in the last 72 hours. Studies/Results: DG Chest 2 View  Result Date: 05/19/2022 CLINICAL DATA:  Chest pain. EXAM: CHEST - 2 VIEW COMPARISON:  April 13, 2022 FINDINGS: The cardiac silhouette is mildly enlarged and unchanged in size. Mild, chronic appearing diffusely increased interstitial lung markings are seen. Mild, stable lingular linear atelectasis is also noted. There is no evidence of a pleural effusion or pneumothorax. The visualized skeletal structures are unremarkable. IMPRESSION: 1. Chronic appearing increased interstitial lung markings with mild, stable lingular linear atelectasis. 2. A superimposed component of mild interstitial edema cannot be excluded. Electronically Signed   By: Virgina Norfolk M.D.   On: 05/19/2022 22:23    Outpatient  Dialysis Orders:  Center: [facility]  on MWF . 180NRe 4 hours BFR 350 DFR 500 EDW 75.5kg 2K 2 Ca AVF 15g No heparin  Hectorol 59mcg IV q HD Korsuva 0.65ml post HD   Renvela 1 tab TID with meals  Midodrine 10mg  pre HD  Assessment/Plan:  Chest pain: somewhat atypical, has not recurred today. Noted plans for echocardiogram  ESRD:  Continue HD on MWF schedule, no emergent indications for dialysis today  Hypertension/volume: Mildly volume overloaded, reports edema has been improving throughout the week. Takes midodrine prior to HD but on hold for now due to prolonged QT interval. UFG 2.5-3L with HD tomorrow.   Anemia: Hgb at goal, no ESA indicated at this time  Metabolic bone disease: Calcium and phos controlled, continue VDRA and binder  Nutrition:  Will need renal diet, reinforced fluid restrictions today IDDM: insulin per primary team Paroxysmal a.fib: on eliquis and amiodarone, RRR on exam today  Anice Paganini, PA-C 05/20/2022, 10:16 AM  [facility] Pager: [phone]

## 2022-05-20 NOTE — Progress Notes (Signed)
Pt using commode

## 2022-05-20 NOTE — Assessment & Plan Note (Signed)
In the setting of ESRD will cont to cycle Echo in AM now CP free

## 2022-05-20 NOTE — Progress Notes (Addendum)
Pt receives out-pt HD at [facility] on MWF 11:30 chair time. Case discussed with renal PA regarding appropriateness for 2nd shift HD tomorrow (inpt) in case pt stable for d/c in the am and pt able to go to out-pt HD clinic for treatment. PA agreeable to 2nd shift. Contacted inpt [facility] and spoke to charge RN. Requested pt be placed on 2nd shift tomorrow in an attempt to avoid HD on day of d/c (in case pt stable for d/c tomorrow am). Will assist as needed.   Melven Sartorius Renal Navigator [phone]  Addendum at 4:32 pm: D/C order noted. Contacted [facility] to advise staff that pt will d/c today and should resume care tomorrow.

## 2022-05-21 LAB — HEMOGLOBIN A1C
Hgb A1c MFr Bld: 7.9 % — ABNORMAL HIGH (ref 4.8–5.6)
Mean Plasma Glucose: 180 mg/dL

## 2022-05-21 LAB — HEPATITIS B SURFACE ANTIBODY, QUANTITATIVE: Hep B S AB Quant (Post): 2526 m[IU]/mL (ref >9.9)

## 2022-06-14 ENCOUNTER — Ambulatory Visit: Payer: Medicare (Managed Care) | Admitting: Neurology

## 2022-06-17 ENCOUNTER — Ambulatory Visit: Payer: Medicare (Managed Care) | Admitting: Neurology

## 2022-06-18 ENCOUNTER — Other Ambulatory Visit: Payer: Self-pay | Admitting: Internal Medicine

## 2022-06-18 DIAGNOSIS — N949 Unspecified condition associated with female genital organs and menstrual cycle: Secondary | ICD-10-CM

## 2022-06-25 ENCOUNTER — Other Ambulatory Visit (HOSPITAL_COMMUNITY): Payer: Self-pay | Admitting: Internal Medicine

## 2022-06-25 DIAGNOSIS — N949 Unspecified condition associated with female genital organs and menstrual cycle: Secondary | ICD-10-CM

## 2022-07-06 ENCOUNTER — Ambulatory Visit (HOSPITAL_COMMUNITY): Payer: Medicare (Managed Care)

## 2022-07-08 ENCOUNTER — Ambulatory Visit (HOSPITAL_COMMUNITY)
Admission: RE | Admit: 2022-07-08 | Discharge: 2022-07-08 | Disposition: A | Discharge status: Home / Self Care | Payer: Medicare (Managed Care) | Source: Ambulatory Visit | Attending: Internal Medicine | Admitting: Internal Medicine

## 2022-07-08 DIAGNOSIS — N949 Unspecified condition associated with female genital organs and menstrual cycle: Secondary | ICD-10-CM

## 2022-07-19 NOTE — Progress Notes (Signed)
Advanced [facility] Failure Clinic Note  PCP: Tenna Child, DO HF Cardiology: Dr. Shirlee Latch Nephrology: Dr Signe Colt.   82 y.o.with history of ESRD, paroxysmal atrial fibrillation, and chronic diastolic CHF.  RHC/LHC was done in 10/15.  This showed markedly elevated filling pressures and pulmonary venous hypertension.  There was no significant CAD.  Last echo in 2/18 showed EF 50-55%, mild LVH, grade 2 diastolic dysfunction.   Admitted 10/2-10/6/19 with SOB. RHC showed elevated filling pressures and preserved CO. She was diuresed with IV lasix, then transitioned to torsemide 80 mg am, 40 mg pm. Repeat echo showed EF 45-50% with normal RV. PYP scan was negative for TTR amyloid. PFTs showed restrictive disease. CT chest showed no ILD but did show right hydronephrosis. Renal US showed single kidney with mild to moderate hydronephrosis on right (unchanged since 2011).  DC weight 208 lbs.    In 12/20, she progressed to ESRD and was started on HD.    Echo in 1/21 showed EF 55-60% with normal.  RHC/LHC in 4/21 showed no obstructive CAD, normal filling pressures, mild pulmonary hypertension.  She had atrial fibrillation in 9/21 and underwent DCCV to NSR.   4/22 was seen in ED for acute dyspnea. Found to be in rapid Afib. This was in setting of recent consumption of high caffeine energy drink. She was given IV Cardizem and converted back to NSR and was released same day.  Admitted 12/22 with atrial fibrillation with RVR. She was cardioverted in ED to SR with PACs. She was started on amio and Toprol. Unfortunately she was found unresponsive, intubated and transferred to ICU on NE. Code stroke activated. MRI brain without acute process. 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  She required CVVH but was able to transition to [facility] after extubation. Echo showed EF 30-35% range, worse function in septum; mild RV dysfunction, IVC dilated. She underwent  LHC showing no obstructive CAD. 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 She went back into afib requiring IV amiodarone and had successful DCCV 03/03/21.  At post hospital visit on 02/28/21, back in A fib. On 03/12/21 she underwent successful cardioversion. Echo 4/23 showed EF 55-60%, mild LVH, normal RV.   Coronary CTA in 11/23 to assess anomalous vessels showed a single large left coronary artery and nondominant RCA, no hemodynamically significant CAD by CT FFR.  Presented to [facility] 3/24 after experiencing substernal chest pain while at HD.  HsTrop 44> 34> 42> 45.  EKG SB 53 bpm.  Echo 3/24 EF 60-65%, no RWMA,GIIIDD, RV mildly reduced, LA mod dilated, trivial MR/TR. increased Protonix and held propranolol and Aricept.  Discharge weight 172 LBS.  Today she returns for post hospital follow up with her daughter. Overall feeling good. Denies palpitations, CP, or PND/Orthopnea. Feels dizzy if she moves around too fast. SOB with walking and pushing her wheelchair on carpet. Some BLE edema. Appetite fair. No fever or chills. Does not weight at home only at dialysis. Taking all medications . On HD M,W,F. They have to stop and give her a break because he BP drops, ultimately able to remove around 2.5 L per patient report. Does not wear compression socks.   ECG (personally reviewed): NSR 1st degree AV block   Labs (2/18): K 4, creatinine 1.79 Labs (10/19): K 4.5, creatinine 2.4 Labs (12/19): LDL 62 Labs (1/20): K 3.9, creatinine 2.72 Labs (12/20): hgb  11.9 Labs (9/21): LDL 64, HDL 53 Labs (4/21): SCr 4.19, K 3.8  Labs (1/23): K 3.6, creatinine 4.89, hgb 10.5 Labs (3/23): LDL 52 Labs (4/23): K 4.3, creatinine 5.60, LFTs normal, TSH normal Labs (3/24): K 4, SCr 3.9  PMH: 1. Atrial fibrillation: Paroxysmal.  2. HTN 3. 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  5.  ESRD 6. H/o IVCD 7. COPD: PFTs (10/19) actually showed severe restriction, concern for interstitial process.  CT chest (10/19) showed mild-moderate patchy air trapping but no evidence for interstitial lung disease.  8. Gout 9. Chronic primarily diastolic CHF: Echo (2/18) with EF 50-55%, mild LVH, grade 2 diastolic dysfunction.  - LHC/RHC (10/15): small distal LAD but no discrete stenosis.  Mean RA 15, PA 55/20 mean 34, mean PCWP 31 => pulmonary venous hypertension.  - PYP scan (10/19): No evidence for TTR amyloidosis.  - Echo (10/19): EF 45-50%, mild LVH, normal RV size and systolic function.  - RHC (10/19): mean RA 13, PA 74/27 mean 50, mean PCWP 28, CI 3.12, PVR 3.6 WU. Pulmonary venous hypertension.  - Echo (1/21): EF 55-60%, normal RV.  - LHC/RHC (4/21): super-dominant LCx with small LAD and RCA, no CAD; mean RA 5, PA 47/15, mean PCWP 10, CI 3.61, PVR 2.5 WU.  - Echo (1/23): EF 30-35% range, worse function in septum; mild RV dysfunction, IVC dilated.  - LHC (1/23): no obstructive CAD. Unusual coronary tree with a single large LCA and a small, nondominant RCA.   - Echo (4/23): EF 55-60%, mild LVH, normal RV.  - Coronary CTA in 11/23 to assess anomalous vessels showed a single large left coronary artery and nondominant RCA, no hemodynamically significant CAD by CT FFR. - Echo 3/24 EF 60-65%, no RWMA,GIIIDD, RV mildly reduced, LA mod dilated, trivial MR/TR 10. Negative sleep study in 6/21.   Review of systems complete and found to be negative unless listed in HPI.    Social History   Socioeconomic History   Marital status: Widowed    Spouse name: Not on file   Number of children: 2   Years of education: Not on file   Highest education level: Not on file  Occupational History   Occupation: retired  Tobacco Use   Smoking status: Former    Packs/day: 1.00    Years: 20.00    Additional pack years: 0.00    Total pack years: 20.00    Types: Cigarettes    Quit date: 02/22/2009    Years  since quitting: 13.4   Smokeless tobacco: Never  Vaping Use   Vaping Use: Never used  Substance and Sexual Activity   Alcohol use: Yes    Alcohol/week: 0.0 standard drinks of alcohol    Comment: occasional beer   Drug use: Not Currently    Comment: marijuana in the past   Sexual activity: Never  Other Topics Concern   Not on file  Social History Narrative   Not on file   Social Determinants of Health   Financial Resource Strain: Low Risk  (02/27/2021)   Overall Financial Resource Strain (CARDIA)    Difficulty of Paying Living Expenses: Not very hard  Food Insecurity: No Food Insecurity (05/20/2022)   Hunger Vital Sign    Worried About Running Out of Food in the Last Year: Never true    Ran Out of Food in the Last Year: Never true  Transportation Needs: No Transportation Needs (05/20/2022)   PRAPARE - Administrator, Civil Service (Medical):  No    Lack of Transportation (Non-Medical): No  Physical Activity: Not on file  Stress: Not on file  Social Connections: Not on file  Intimate Partner Violence: Not At Risk (05/20/2022)   Humiliation, Afraid, Rape, and Kick questionnaire    Fear of Current or Ex-Partner: No    Emotionally Abused: No    Physically Abused: No    Sexually Abused: No   Family History  Problem Relation Age of Onset   Lupus Daughter    Cancer Mother 14       type unknown   CVA Father 63   Cancer Brother 45       type unknown   Review of systems complete and found to be negative unless listed in HPI.    Current Outpatient Medications  Medication Sig Dispense Refill   acetaminophen (TYLENOL) 500 MG tablet Take 1,000 mg by mouth in the morning and at bedtime.     albuterol (PROVENTIL HFA;VENTOLIN HFA) 108 (90 BASE) MCG/ACT inhaler Inhale 2 puffs into the lungs in the morning and at bedtime.     amiodarone (PACERONE) 200 MG tablet Take 200 mg by mouth daily.     atorvastatin (LIPITOR) 80 MG tablet Take 80 mg by mouth at bedtime.      budesonide-formoterol (SYMBICORT) 160-4.5 MCG/ACT inhaler Inhale 2 puffs into the lungs 2 (two) times daily. 1 each 0   Cholecalciferol (VITAMIN D3) 25 MCG (1000 UT) tablet Take 1,000 Units by mouth daily.     cyanocobalamin (VITAMIN B12) 500 MCG tablet Take 500 mcg by mouth daily.     diphenhydrAMINE (BENADRYL) 25 mg capsule Take 25 mg by mouth every 6 (six) hours as needed for itching.     ELIQUIS 5 MG TABS tablet Take 1 tablet by mouth twice daily 180 tablet 3   insulin glargine (SEMGLEE) 100 UNIT/ML injection Inject 16 Units into the skin at bedtime.     Insulin Pen Needle 32G X 4 MM MISC 1 Device by Does not apply route in the morning, at noon, in the evening, and at bedtime. 400 each 3   latanoprost (XALATAN) 0.005 % ophthalmic solution Place 1 drop into both eyes at bedtime.     lidocaine (LINDAMANTLE) 3 % CREA cream Apply 1 application topically every Monday, Wednesday, and Friday. Prior to dialysis     Lidocaine 4 % PTCH Place 1 patch onto the skin daily as needed (for pain). 1/2 patch applied to shoulder or back     Menthol, Topical Analgesic, (BIOFREEZE EX) Apply 1 application  topically in the morning and at bedtime.     midodrine (PROAMATINE) 10 MG tablet Take 10 mg by mouth 3 (three) times a week. Taken before dialysis on Monday, Wednesday, Friday Hold if SBP>130     pantoprazole (PROTONIX) 40 MG tablet Take 1 tablet (40 mg total) by mouth 2 (two) times daily.     Pramoxine-Calamine (AVEENO ANTI-ITCH EX) Apply 1 application topically every 6 (six) hours as needed (itching).     No current facility-administered medications for this encounter.   BP (!) 154/64   Pulse 68   Wt 79.1 kg (174 lb 6.4 oz)   SpO2 90%   BMI 31.90 kg/m   Wt Readings from Last 3 Encounters:  07/20/22 79.1 kg (174 lb 6.4 oz)  05/20/22 78.2 kg (172 lb 6.4 oz)  04/15/22 83 kg (183 lb)   General:  elderly appearing.  No respiratory difficulty. Arrived in [facility] HEENT: normal Neck: supple. JVD ~10  cm.  Carotids 2+ bilat; no bruits. No lymphadenopathy or thyromegaly appreciated. Cor: PMI nondisplaced. Regular rate & rhythm. No rubs, gallops or murmurs. Lungs: clear, diminished bases Abdomen: soft, nontender, nondistended. No hepatosplenomegaly. No bruits or masses. Good bowel sounds. Extremities: no cyanosis, clubbing, rash, +1 BLE edema. Fistula LUE +bruit/thrill Neuro: alert & oriented x 3, cranial nerves grossly intact. moves all 4 extremities w/o difficulty. Affect pleasant.   Assessment/Plan: 1. Chronic systolic CHF: Echo in 2/18 with EF 50-55%, mild LVH, grade 2 diastolic dysfunction.  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  RHC 11/23/17 showed significantly elevated right and left heart filling pressures and severe primarily pulmonary venous hypertension. CO preserved. Echo 10/19: EF 45-50%, septal-lateral dyssynchrony, RV looked ok.  PYP scan was not suggestive of transthyretin amyloidosis.  Echo 1/21 with EF 55-60%, normal RV.  RHC/LHC in 4/21 with no significant CAD, normal filling pressures and cardiac output.  Echo 1/23 EF down to 35%.  HS-TnI 385 => 378, likely demand ischemia with hypotension/respiratory arrest.  LHC showed no obstructive CAD, Unusual coronary tree with a single large left coronary and nondominant RCA, anatomy confirmed by coronary CTA in 11/23.  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  Echo 3/24 EF 60-65%, no RWMA,GIIIDD, RV mildly reduced, LA mod dilated, trivial MR/TR.  - Chronic NYHA class III symptoms, not very active at baseline  - Continue midodrine 10 mg tid, no BP room for GDMT.   - add compression socks 2.  Atrial fibrillation: Paroxysmal.  Seen 12/22 for AF and spontaneously converted to SR.  Presented 02/17/21 with AF RVR. Cardioverted in ER.  She was in junctional rhythm after respiratory arrest and intubation with rate  upper 40s-50s. Rhythm on 02/24/21 appeared to be wandering atrial pacemaker, and amiodarone stopped.  Reverted back to AF with RVR and amiodarone restarted. S/p DCCV 03/03/21 and 03/12/21.  Appears to have remained in NSR since then, NSR today.  - Continue amiodarone 200 mg daily.  AST,ALT, TSH stable 3/24.  She will need a regular eye exam.   - Continue Eliquis 5 mg bid. Denies bleeding. 3. ESRD: Tolerating iHD.  - Continue midodrine 10 mg tid.  4. Pulmonary HTN: Mild PH on 4/21 RHC.  5. Hyperlipidemia: Continue statin.  6. DM II: She is on insulin. 7. CAD: Coronary CTA in 11/23 with no hemodynamically significant CAD by FFR.  - Continue statin as above.   Followup 3-4 months with Dr. Thressa Sheller AGACNP-BC  07/20/2022

## 2022-07-20 ENCOUNTER — Other Ambulatory Visit: Payer: Self-pay | Admitting: Internal Medicine

## 2022-07-20 ENCOUNTER — Encounter (HOSPITAL_COMMUNITY): Payer: Self-pay

## 2022-07-20 ENCOUNTER — Ambulatory Visit (HOSPITAL_COMMUNITY)
Admission: RE | Admit: 2022-07-20 | Discharge: 2022-07-20 | Disposition: A | Discharge status: Home / Self Care | Payer: Medicare (Managed Care) | Source: Ambulatory Visit | Attending: Internal Medicine | Admitting: Internal Medicine

## 2022-07-20 VITALS — BP 154/64 | HR 68 | Wt 174.4 lb

## 2022-07-20 DIAGNOSIS — I272 Pulmonary hypertension, unspecified: Secondary | ICD-10-CM | POA: Insufficient documentation

## 2022-07-20 DIAGNOSIS — I5022 Chronic systolic (congestive) heart failure: Secondary | ICD-10-CM

## 2022-07-20 DIAGNOSIS — I132 Hypertensive heart and chronic kidney disease with heart failure and with stage 5 chronic kidney disease, or end stage renal disease: Secondary | ICD-10-CM | POA: Insufficient documentation

## 2022-07-20 DIAGNOSIS — I454 Nonspecific intraventricular block: Secondary | ICD-10-CM | POA: Insufficient documentation

## 2022-07-20 DIAGNOSIS — I5042 Chronic combined systolic (congestive) and diastolic (congestive) heart failure: Secondary | ICD-10-CM | POA: Diagnosis present

## 2022-07-20 DIAGNOSIS — Z7901 Long term (current) use of anticoagulants: Secondary | ICD-10-CM | POA: Insufficient documentation

## 2022-07-20 DIAGNOSIS — I2510 Atherosclerotic heart disease of native coronary artery without angina pectoris: Secondary | ICD-10-CM | POA: Insufficient documentation

## 2022-07-20 DIAGNOSIS — E118 Type 2 diabetes mellitus with unspecified complications: Secondary | ICD-10-CM

## 2022-07-20 DIAGNOSIS — Z87891 Personal history of nicotine dependence: Secondary | ICD-10-CM | POA: Insufficient documentation

## 2022-07-20 DIAGNOSIS — E785 Hyperlipidemia, unspecified: Secondary | ICD-10-CM | POA: Diagnosis not present

## 2022-07-20 DIAGNOSIS — Z794 Long term (current) use of insulin: Secondary | ICD-10-CM | POA: Diagnosis not present

## 2022-07-20 DIAGNOSIS — N186 End stage renal disease: Secondary | ICD-10-CM | POA: Diagnosis not present

## 2022-07-20 DIAGNOSIS — I480 Paroxysmal atrial fibrillation: Secondary | ICD-10-CM | POA: Diagnosis not present

## 2022-07-20 DIAGNOSIS — Z79899 Other long term (current) drug therapy: Secondary | ICD-10-CM | POA: Diagnosis not present

## 2022-07-20 DIAGNOSIS — E1122 Type 2 diabetes mellitus with diabetic chronic kidney disease: Secondary | ICD-10-CM | POA: Insufficient documentation

## 2022-07-20 DIAGNOSIS — Z992 Dependence on renal dialysis: Secondary | ICD-10-CM | POA: Diagnosis not present

## 2022-07-20 DIAGNOSIS — I498 Other specified cardiac arrhythmias: Secondary | ICD-10-CM | POA: Insufficient documentation

## 2022-07-20 DIAGNOSIS — N83201 Unspecified ovarian cyst, right side: Secondary | ICD-10-CM

## 2022-07-20 NOTE — Patient Instructions (Addendum)
Thank you for coming in today  If you had labs drawn today, any labs that are abnormal the clinic will call you No news is good news  You have been given a prescription for compression hose. And a list of places who supply them. Please wear your compression hose daily, place them on as soon as you get up in the morning and remove before you go to bed at night.   Medications: No changes  Follow up appointments:  Your physician recommends that you schedule a follow-up appointment in:  3-4 months With Dr. Shirlee Latch    Do the following things EVERYDAY: Weigh yourself in the morning before breakfast. Write it down and keep it in a log. Take your medicines as prescribed Eat low salt foods--Limit salt (sodium) to 2000 mg per day.  Stay as active as you can everyday Limit all fluids for the day to less than 2 liters   At the [facility], you and your health needs are our priority. As part of our continuing mission to provide you with exceptional heart care, we have created designated Provider Care Teams. These Care Teams include your primary Cardiologist (physician) and Advanced Practice Providers (APPs- Physician Assistants and Nurse Practitioners) who all work together to provide you with the care you need, when you need it.   You may see any of the following providers on your designated Care Team at your next follow up: Dr Arvilla Meres Dr Marca Ancona Dr. Marcos Eke, NP Robbie Lis, [place] [facility] [place], [place] Brynda Peon, NP Karle Plumber, PharmD   Please be sure to bring in all your medications bottles to every appointment.    Thank you for choosing [facility]  If you have any questions or concerns before your next appointment please send [place] a message through [place] or call our office at [phone].    TO LEAVE A MESSAGE FOR THE NURSE SELECT OPTION 2, PLEASE LEAVE A MESSAGE  INCLUDING: YOUR NAME DATE OF BIRTH CALL BACK NUMBER REASON FOR CALL**this is important as we prioritize the call backs  YOU WILL RECEIVE A CALL BACK THE SAME DAY AS LONG AS YOU CALL BEFORE 4:00 PM

## 2022-08-18 ENCOUNTER — Other Ambulatory Visit: Payer: Medicare (Managed Care)

## 2022-08-19 ENCOUNTER — Other Ambulatory Visit: Payer: Medicare (Managed Care)

## 2022-09-14 ENCOUNTER — Ambulatory Visit
Admission: RE | Admit: 2022-09-14 | Discharge: 2022-09-14 | Discharge status: Home / Self Care | Payer: Medicare (Managed Care) | Source: Ambulatory Visit | Attending: Internal Medicine

## 2022-09-14 DIAGNOSIS — N83202 Unspecified ovarian cyst, left side: Secondary | ICD-10-CM

## 2022-09-16 ENCOUNTER — Ambulatory Visit (HOSPITAL_COMMUNITY)
Admission: RE | Admit: 2022-09-16 | Discharge: 2022-09-16 | Disposition: A | Discharge status: Home / Self Care | Payer: Medicare (Managed Care) | Source: Ambulatory Visit | Attending: Cardiology | Admitting: Cardiology

## 2022-09-16 ENCOUNTER — Encounter (HOSPITAL_COMMUNITY): Payer: Self-pay | Admitting: Cardiology

## 2022-09-16 VITALS — BP 140/80 | HR 66 | Wt 172.0 lb

## 2022-09-16 DIAGNOSIS — E1122 Type 2 diabetes mellitus with diabetic chronic kidney disease: Secondary | ICD-10-CM | POA: Diagnosis not present

## 2022-09-16 DIAGNOSIS — I5032 Chronic diastolic (congestive) heart failure: Secondary | ICD-10-CM | POA: Insufficient documentation

## 2022-09-16 DIAGNOSIS — I480 Paroxysmal atrial fibrillation: Secondary | ICD-10-CM | POA: Diagnosis present

## 2022-09-16 DIAGNOSIS — I2510 Atherosclerotic heart disease of native coronary artery without angina pectoris: Secondary | ICD-10-CM | POA: Diagnosis not present

## 2022-09-16 DIAGNOSIS — I132 Hypertensive heart and chronic kidney disease with heart failure and with stage 5 chronic kidney disease, or end stage renal disease: Secondary | ICD-10-CM | POA: Diagnosis not present

## 2022-09-16 DIAGNOSIS — Z7901 Long term (current) use of anticoagulants: Secondary | ICD-10-CM | POA: Insufficient documentation

## 2022-09-16 DIAGNOSIS — Z87891 Personal history of nicotine dependence: Secondary | ICD-10-CM | POA: Insufficient documentation

## 2022-09-16 DIAGNOSIS — I5022 Chronic systolic (congestive) heart failure: Secondary | ICD-10-CM | POA: Diagnosis not present

## 2022-09-16 DIAGNOSIS — Z992 Dependence on renal dialysis: Secondary | ICD-10-CM | POA: Diagnosis not present

## 2022-09-16 DIAGNOSIS — N186 End stage renal disease: Secondary | ICD-10-CM | POA: Insufficient documentation

## 2022-09-16 DIAGNOSIS — E785 Hyperlipidemia, unspecified: Secondary | ICD-10-CM | POA: Insufficient documentation

## 2022-09-16 DIAGNOSIS — I272 Pulmonary hypertension, unspecified: Secondary | ICD-10-CM | POA: Insufficient documentation

## 2022-09-16 LAB — LIPID PANEL
Cholesterol: 122 mg/dL (ref 0–200)
HDL: 65 mg/dL (ref >40)
LDL Cholesterol: 46 mg/dL (ref 0–99)
Total CHOL/HDL Ratio: 1.9 RATIO
Triglycerides: 57 mg/dL (ref <150)
VLDL: 11 mg/dL (ref 0–40)

## 2022-09-16 LAB — COMPREHENSIVE METABOLIC PANEL
ALT: 15 U/L (ref 0–44)
AST: 16 U/L (ref 15–41)
Albumin: 3.2 g/dL — ABNORMAL LOW (ref 3.5–5.0)
Alkaline Phosphatase: 119 U/L (ref 38–126)
Anion gap: 12 (ref 5–15)
BUN: 13 mg/dL (ref 8–23)
CO2: 30 mmol/L (ref 22–32)
Calcium: 8.8 mg/dL — ABNORMAL LOW (ref 8.9–10.3)
Chloride: 94 mmol/L — ABNORMAL LOW (ref 98–111)
Creatinine, Ser: 4.65 mg/dL — ABNORMAL HIGH (ref 0.44–1.00)
GFR, Estimated: 9 mL/min — ABNORMAL LOW (ref >60)
Glucose, Bld: 266 mg/dL — ABNORMAL HIGH (ref 70–99)
Potassium: 4 mmol/L (ref 3.5–5.1)
Sodium: 136 mmol/L (ref 135–145)
Total Bilirubin: 0.7 mg/dL (ref 0.3–1.2)
Total Protein: 6.8 g/dL (ref 6.5–8.1)

## 2022-09-16 LAB — CBC
HCT: 39.2 % (ref 36.0–46.0)
Hemoglobin: 12.5 g/dL (ref 12.0–15.0)
MCH: 31.9 pg (ref 26.0–34.0)
MCHC: 31.9 g/dL (ref 30.0–36.0)
MCV: 100 fL (ref 80.0–100.0)
Platelets: 200 10*3/uL (ref 150–400)
RBC: 3.92 MIL/uL (ref 3.87–5.11)
RDW: 15.7 % — ABNORMAL HIGH (ref 11.5–15.5)
WBC: 7.1 10*3/uL (ref 4.0–10.5)
nRBC: 0 % (ref 0.0–0.2)

## 2022-09-16 LAB — TSH: TSH: 2.311 u[IU]/mL (ref 0.350–4.500)

## 2022-09-16 NOTE — Progress Notes (Signed)
Advanced Heart Failure Clinic Note  PCP: Tenna Child, DO HF Cardiology: Dr. Shirlee Latch Nephrology: Dr Signe Colt.   82 y.o.with history of ESRD, paroxysmal atrial fibrillation, and chronic diastolic CHF.  RHC/LHC was done in 10/15.  This showed markedly elevated filling pressures and pulmonary venous hypertension.  There was no significant CAD.  Last echo in 2/18 showed EF 50-55%, mild LVH, grade 2 diastolic dysfunction.   Admitted 10/2-10/6/19 with SOB. RHC showed elevated filling pressures and preserved CO. She was diuresed with IV lasix, then transitioned to torsemide 80 mg am, 40 mg pm. Repeat echo showed EF 45-50% with normal RV. PYP scan was negative for TTR amyloid. PFTs showed restrictive disease. CT chest showed no ILD but did show right hydronephrosis. Renal US showed single kidney with mild to moderate hydronephrosis on right (unchanged since 2011).  DC weight 208 lbs.    In 12/20, she progressed to ESRD and was started on HD.    Echo in 1/21 showed EF 55-60% with normal.  RHC/LHC in 4/21 showed no obstructive CAD, normal filling pressures, mild pulmonary hypertension.  She had atrial fibrillation in 9/21 and underwent DCCV to NSR.   4/22 was seen in ED for acute dyspnea. Found to be in rapid Afib. This was in setting of recent consumption of high caffeine energy drink. She was given IV Cardizem and converted back to NSR and was released same day.  Admitted 12/22 with atrial fibrillation with RVR. She was cardioverted in ED to SR with PACs. She was started on amio and Toprol. Unfortunately she was found unresponsive, intubated and transferred to ICU on NE. Code stroke activated. MRI brain without acute process. 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  She required CVVH but was able to transition to [facility] after extubation. Echo showed EF 30-35% range, worse function in septum; mild RV dysfunction, IVC dilated. She underwent  LHC showing no obstructive CAD. 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 She went back into afib requiring IV amiodarone and had successful DCCV 03/03/21.  At post hospital visit on 02/28/21, back in A fib. On 03/12/21 she underwent successful cardioversion. Echo 4/23 showed EF 55-60%, mild LVH, normal RV.   Coronary CTA in 11/23 to assess anomalous vessels showed a single large left coronary artery and nondominant RCA, no hemodynamically significant CAD by CT FFR.  Today she returns for HF follow up with her daughter. She is in NSR today.  Still having trouble with BP dropping at HD, taking midodrine prior to HD. She is doing PT at [facility] twice a week.  She uses a walker or wheelchair, still gets short of breath walking around her house.  No chest pain or palpitations.  Weight down 2 lbs.   ECG (personally reviewed): NSR, IVCD 132 msec  Labs (2/18): K 4, creatinine 1.79 Labs (10/19): K 4.5, creatinine 2.4 Labs (12/19): LDL 62 Labs (1/20): K 3.9, creatinine 2.72 Labs (12/20): hgb 11.9 Labs (9/21): LDL 64, HDL 53 Labs (4/21): SCr 4.19, K 3.8  Labs (1/23): K 3.6, creatinine 4.89, hgb 10.5 Labs (3/23): LDL 52 Labs (4/23): K 4.3, creatinine 5.60, LFTs normal, TSH normal Labs (3/24): hgb 10.5, LFTs normal, TSH normal  PMH: 1. Atrial fibrillation: Paroxysmal.  2. HTN 3. 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  5. ESRD 6. H/o IVCD 7. COPD: PFTs (10/19) actually showed severe restriction,  concern for interstitial process.  CT chest (10/19) showed mild-moderate patchy air trapping but no evidence for interstitial lung disease.  8. Gout 9. Chronic primarily diastolic CHF: Echo (2/18) with EF 50-55%, mild LVH, grade 2 diastolic dysfunction.  - LHC/RHC (10/15): small distal LAD but no discrete stenosis.  Mean RA 15, PA 55/20 mean 34, mean PCWP 31 => pulmonary  venous hypertension.  - PYP scan (10/19): No evidence for TTR amyloidosis.  - Echo (10/19): EF 45-50%, mild LVH, normal RV size and systolic function.  - RHC (10/19): mean RA 13, PA 74/27 mean 50, mean PCWP 28, CI 3.12, PVR 3.6 WU. Pulmonary venous hypertension.  - Echo (1/21): EF 55-60%, normal RV.  - LHC/RHC (4/21): super-dominant LCx with small LAD and RCA, no CAD; mean RA 5, PA 47/15, mean PCWP 10, CI 3.61, PVR 2.5 WU.  - Echo (1/23): EF 30-35% range, worse function in septum; mild RV dysfunction, IVC dilated.  - LHC (1/23): no obstructive CAD. Unusual coronary tree with a single large LCA and a small, nondominant RCA.   - Echo (4/23): EF 55-60%, mild LVH, normal RV.  - Coronary CTA in 11/23 to assess anomalous vessels showed a single large left coronary artery and nondominant RCA, no hemodynamically significant CAD by CT FFR. 10. Negative sleep study in 6/21.   Review of systems complete and found to be negative unless listed in HPI.    Social History   Socioeconomic History   Marital status: Widowed    Spouse name: Not on file   Number of children: 2   Years of education: Not on file   Highest education level: Not on file  Occupational History   Occupation: retired  Tobacco Use   Smoking status: Former    Current packs/day: 0.00    Average packs/day: 1 pack/day for 20.0 years (20.0 ttl pk-yrs)    Types: Cigarettes    Start date: 02/22/1989    Quit date: 02/22/2009    Years since quitting: 13.5   Smokeless tobacco: Never  Vaping Use   Vaping status: Never Used  Substance and Sexual Activity   Alcohol use: Yes    Alcohol/week: 0.0 standard drinks of alcohol    Comment: occasional beer   Drug use: Not Currently    Comment: marijuana in the past   Sexual activity: Never  Other Topics Concern   Not on file  Social History Narrative   Not on file   Social Determinants of Health   Financial Resource Strain: Low Risk  (02/27/2021)   Overall Financial Resource Strain (CARDIA)     Difficulty of Paying Living Expenses: Not very hard  Food Insecurity: No Food Insecurity (05/20/2022)   Hunger Vital Sign    Worried About Running Out of Food in the Last Year: Never true    Ran Out of Food in the Last Year: Never true  Transportation Needs: No Transportation Needs (05/20/2022)   PRAPARE - Administrator, Civil Service (Medical): No    Lack of Transportation (Non-Medical): No  Physical Activity: Not on file  Stress: Not on file  Social Connections: Not on file  Intimate Partner Violence: Not At Risk (05/20/2022)   Humiliation, Afraid, Rape, and Kick questionnaire    Fear of Current or Ex-Partner: No    Emotionally Abused: No    Physically Abused: No    Sexually Abused: No   Family History  Problem Relation Age of Onset   Lupus Daughter    Cancer Mother  85       type unknown   CVA Father 37   Cancer Brother 45       type unknown   Review of systems complete and found to be negative unless listed in HPI.    Current Outpatient Medications  Medication Sig Dispense Refill   acetaminophen (TYLENOL) 500 MG tablet Take 1,000 mg by mouth in the morning and at bedtime.     albuterol (PROVENTIL HFA;VENTOLIN HFA) 108 (90 BASE) MCG/ACT inhaler Inhale 2 puffs into the lungs in the morning and at bedtime.     amiodarone (PACERONE) 200 MG tablet Take 200 mg by mouth daily.     atorvastatin (LIPITOR) 80 MG tablet Take 80 mg by mouth at bedtime.     budesonide-formoterol (SYMBICORT) 160-4.5 MCG/ACT inhaler Inhale 2 puffs into the lungs 2 (two) times daily. 1 each 0   Cholecalciferol (VITAMIN D3) 25 MCG (1000 UT) tablet Take 1,000 Units by mouth daily.     cyanocobalamin (VITAMIN B12) 500 MCG tablet Take 500 mcg by mouth daily.     diphenhydrAMINE (BENADRYL) 25 mg capsule Take 25 mg by mouth every 6 (six) hours as needed for itching.     ELIQUIS 5 MG TABS tablet Take 1 tablet by mouth twice daily 180 tablet 3   Insulin Glargine (SEMGLEE [place]) Inject 16 Units into the  skin in the morning.     Insulin Pen Needle 32G X 4 MM MISC 1 Device by Does not apply route in the morning, at noon, in the evening, and at bedtime. 400 each 3   latanoprost (XALATAN) 0.005 % ophthalmic solution Place 1 drop into both eyes at bedtime.     lidocaine (LINDAMANTLE) 3 % CREA cream Apply 1 application topically every Monday, Wednesday, and Friday. Prior to dialysis     Lidocaine 4 % PTCH Place 1 patch onto the skin daily as needed (for pain). 1/2 patch applied to shoulder or back     Menthol, Topical Analgesic, (BIOFREEZE EX) Apply 1 application  topically in the morning and at bedtime.     midodrine (PROAMATINE) 10 MG tablet Take 10 mg by mouth 3 (three) times a week. Taken before dialysis on Monday, Wednesday, Friday Hold if SBP>130     pantoprazole (PROTONIX) 40 MG tablet Take 1 tablet (40 mg total) by mouth 2 (two) times daily.     Pramoxine-Calamine (AVEENO ANTI-ITCH EX) Apply 1 application topically every 6 (six) hours as needed (itching).     No current facility-administered medications for this encounter.   BP (!) 140/80   Pulse 66   Wt 78 kg (172 lb)   SpO2 92%   BMI 31.46 kg/m   Wt Readings from Last 3 Encounters:  09/16/22 78 kg (172 lb)  07/20/22 79.1 kg (174 lb 6.4 oz)  05/20/22 78.2 kg (172 lb 6.4 oz)   General: NAD Neck: JVP 10 cm, no thyromegaly or thyroid nodule.  Lungs: Clear to auscultation bilaterally with normal respiratory effort. CV: Nondisplaced PMI.  Heart regular S1/S2, no S3/S4, no murmur.  1+ ankle edema.  No carotid bruit.  Normal pedal pulses.  Abdomen: Soft, nontender, no hepatosplenomegaly, no distention.  Skin: Intact without lesions or rashes.  Neurologic: Alert and oriented x 3.  Psych: Normal affect. Extremities: No clubbing or cyanosis.  HEENT: Normal.   Assessment/Plan: 1.  Atrial fibrillation: Paroxysmal AF.  Seen in ED at [facility] 12/22 for AF and spontaneously converted to SR.  Presented 02/17/21 with AF with  RVR.  Cardioverted in ER.  She was in junctional rhythm after respiratory arrest and intubation with rate upper 40s-50s. Rhythm on 02/24/21  appeared to be wandering atrial pacemaker, and amiodarone stopped.  Reverted back to AF with RVR and amiodarone restarted. S/p DCCV 03/03/21 and 03/12/21.  She appears to have remained in NSR since then, NSR today.  - Continue amiodarone 200 mg daily.  Check LFTs and TSH today.  She will need a regular eye exam.   - Continue Eliquis 5 mg bid. CBC today. .  2. Chronic systolic CHF: Echo in 2/18 with EF 50-55%, mild LVH, grade 2 diastolic dysfunction.  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  RHC 11/23/17 showed significantly elevated right and left heart filling pressures and severe primarily pulmonary venous hypertension. Cardiac output preserved. Echo (10/19) with EF 45-50%, septal-lateral dyssynchrony, RV looked ok.  PYP scan was not suggestive of transthyretin amyloidosis.  Echo in 1/21 with EF 55-60%, normal RV.  RHC/LHC in 4/21 with no significant CAD, normal filling pressures and cardiac output.  Echo 1/23 showed EF down to 35%.  HS-TnI 385 => 378, likely demand ischemia with hypotension/respiratory arrest.  LHC showed no obstructive CAD, Unusual coronary tree with a single large left coronary and nondominant RCA, anatomy confirmed by coronary CTA in 11/23.  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  Chronic NYHA class IIIb symptoms, likely that deconditioning plays a large role but she is also volume overloaded on exam.   - Continue midodrine pre-HD.  - She needs more fluid removed via HD, may have to go 4 days/week if BP is limiting more ultrafiltration at a given session.   3. ESRD: Tolerating iHD.  - Continue midodrine pre-HD.  4. Pulmonary HTN: Mild PH on 4/21 RHC.  5. Hyperlipidemia: Continue statin.  - Check lipids today.  6.  DM II: She is on insulin. 7. CAD: Coronary CTA in 11/23 with no hemodynamically significant CAD by FFR.  - Continue statin as above.   Followup 6 months with APP  Marca Ancona 09/16/2022

## 2022-09-16 NOTE — Patient Instructions (Addendum)
Medication Changes:  No Changes In Medications at this time.    Lab Work:  Labs done today, your results will be available in MyChart, we will contact you for abnormal readings.  Follow-Up in: 6 MONTHS PLEASE CALL OUR OFFICE AROUND NOVEMBER TO GET SCHEDULED FOR YOUR APPOINTMENT. PHONE NUMBER IS [phone] OPTION 2    At the [facility], you and your health needs are our priority. We have a designated team specialized in the treatment of Heart Failure. This Care Team includes your primary Heart Failure Specialized Cardiologist (physician), Advanced Practice Providers (APPs- Physician Assistants and Nurse Practitioners), and Pharmacist who all work together to provide you with the care you need, when you need it.   You may see any of the following providers on your designated Care Team at your next follow up:  Dr. Arvilla Meres Dr. Marca Ancona Dr. Marcos Eke, NP Robbie Lis, [place] [facility] [place], [place] Brynda Peon, NP Karle Plumber, PharmD   Please be sure to bring in all your medications bottles to every appointment.   Need to Contact us:  If you have any questions or concerns before your next appointment please send [place] a message through [place] or call our office at [phone].    TO LEAVE A MESSAGE FOR THE NURSE SELECT OPTION 2, PLEASE LEAVE A MESSAGE INCLUDING: YOUR NAME DATE OF BIRTH CALL BACK NUMBER REASON FOR CALL**this is important as we prioritize the call backs  YOU WILL RECEIVE A CALL BACK THE SAME DAY AS LONG AS YOU CALL BEFORE 4:00 PM

## 2022-10-19 ENCOUNTER — Ambulatory Visit: Payer: Medicare (Managed Care) | Admitting: Neurology

## 2022-11-02 ENCOUNTER — Emergency Department (HOSPITAL_COMMUNITY)
Admission: EM | Admit: 2022-11-02 | Discharge: 2022-11-02 | Disposition: A | Discharge status: Home / Self Care | Payer: Medicare (Managed Care) | Attending: Emergency Medicine | Admitting: Emergency Medicine

## 2022-11-02 ENCOUNTER — Encounter (HOSPITAL_COMMUNITY): Payer: Self-pay | Admitting: Emergency Medicine

## 2022-11-02 ENCOUNTER — Emergency Department (HOSPITAL_COMMUNITY): Payer: Medicare (Managed Care)

## 2022-11-02 ENCOUNTER — Other Ambulatory Visit: Payer: Self-pay

## 2022-11-02 DIAGNOSIS — J449 Chronic obstructive pulmonary disease, unspecified: Secondary | ICD-10-CM | POA: Diagnosis not present

## 2022-11-02 DIAGNOSIS — E1122 Type 2 diabetes mellitus with diabetic chronic kidney disease: Secondary | ICD-10-CM | POA: Insufficient documentation

## 2022-11-02 DIAGNOSIS — I5030 Unspecified diastolic (congestive) heart failure: Secondary | ICD-10-CM | POA: Diagnosis not present

## 2022-11-02 DIAGNOSIS — N184 Chronic kidney disease, stage 4 (severe): Secondary | ICD-10-CM | POA: Diagnosis not present

## 2022-11-02 DIAGNOSIS — Z7901 Long term (current) use of anticoagulants: Secondary | ICD-10-CM | POA: Diagnosis not present

## 2022-11-02 DIAGNOSIS — R079 Chest pain, unspecified: Secondary | ICD-10-CM | POA: Diagnosis present

## 2022-11-02 DIAGNOSIS — I130 Hypertensive heart and chronic kidney disease with heart failure and stage 1 through stage 4 chronic kidney disease, or unspecified chronic kidney disease: Secondary | ICD-10-CM | POA: Insufficient documentation

## 2022-11-02 DIAGNOSIS — R7989 Other specified abnormal findings of blood chemistry: Secondary | ICD-10-CM | POA: Diagnosis not present

## 2022-11-02 LAB — CBC
HCT: 40.7 % (ref 36.0–46.0)
Hemoglobin: 12.7 g/dL (ref 12.0–15.0)
MCH: 30.8 pg (ref 26.0–34.0)
MCHC: 31.2 g/dL (ref 30.0–36.0)
MCV: 98.8 fL (ref 80.0–100.0)
Platelets: 208 10*3/uL (ref 150–400)
RBC: 4.12 MIL/uL (ref 3.87–5.11)
RDW: 15 % (ref 11.5–15.5)
WBC: 8.1 10*3/uL (ref 4.0–10.5)
nRBC: 0 % (ref 0.0–0.2)

## 2022-11-02 LAB — TROPONIN I (HIGH SENSITIVITY)
Troponin I (High Sensitivity): 54 ng/L — ABNORMAL HIGH (ref <18)
Troponin I (High Sensitivity): 53 ng/L — ABNORMAL HIGH (ref <18)

## 2022-11-02 LAB — BASIC METABOLIC PANEL
Anion gap: 12 (ref 5–15)
BUN: 18 mg/dL (ref 8–23)
CO2: 34 mmol/L — ABNORMAL HIGH (ref 22–32)
Calcium: 9.2 mg/dL (ref 8.9–10.3)
Chloride: 94 mmol/L — ABNORMAL LOW (ref 98–111)
Creatinine, Ser: 5.24 mg/dL — ABNORMAL HIGH (ref 0.44–1.00)
GFR, Estimated: 8 mL/min — ABNORMAL LOW (ref >60)
Glucose, Bld: 314 mg/dL — ABNORMAL HIGH (ref 70–99)
Potassium: 3.6 mmol/L (ref 3.5–5.1)
Sodium: 140 mmol/L (ref 135–145)

## 2022-11-02 MED ORDER — ACETAMINOPHEN 500 MG PO TABS
1000.0000 mg | ORAL_TABLET | Freq: Once | ORAL | Status: AC
  Administered 2022-11-02: 1000 mg via ORAL
  Filled 2022-11-02: qty 2

## 2022-11-02 NOTE — ED Provider Triage Note (Signed)
Emergency Medicine Provider Triage Evaluation Note  Stephanie Woodward , a 82 y.o. female  was evaluated in triage.  Pt complains of chest pain while exercising.  Patient was riding a stationary bicycle when she felt chest pain.  This persisted for some time afterwards, but gradually eased off entirely and she is currently asymptomatic.  She notes a history of cardiac disease.  Review of Systems  Positive: Chest pain Negative: Syncope  Physical Exam  BP (!) 151/45 (BP Location: Right Arm)   Pulse 60   Temp 98.1 F (36.7 C) (Oral)   Resp 17   Ht 5\' 2"  (1.575 m)   Wt 78 kg   SpO2 92%   BMI 31.46 kg/m  Gen:   Awake, no distress speaking clearly Resp:  Normal effort no increased work of breathing MSK:   Moves extremities without difficulty no deformity Other:  Neuro intact  Medical Decision Making  Medically screening exam initiated at 1:23 PM.  Appropriate orders placed.  Stephanie Woodward was informed that the remainder of the evaluation will be completed by another provider, this initial triage assessment does not replace that evaluation, and the importance of remaining in the ED until their evaluation is complete.   Gerhard Munch, MD 11/02/22 1323

## 2022-11-02 NOTE — Discharge Instructions (Addendum)
Continue aspirin 81 mg daily until seen by cardiologist.  Follow-up with cardiology as soon as possible.

## 2022-11-02 NOTE — ED Provider Notes (Signed)
[place] EMERGENCY DEPARTMENT AT [facility] Provider Note   CSN: [id number] Arrival date & time: 11/02/22  1313     History Chief Complaint  Patient presents with   Chest Pain    HPI Stephanie Woodward is a 82 y.o. female presenting for chief complaint of chest pain. The patient presents with a chief complaint of chest pain that began while exercising on a stationary bike. The patient reports feeling tired in the chest and experiencing pain, which persisted after stopping the exercise. The patient has a history of heart condition and has experienced similar chest pain before, but not frequently. The pain is typically located in the middle of the chest and was last experienced around 12:30 or quarter to one today. The patient denies any current chest pain or abdominal pain but reports some swelling in the legs.  The patient sought medical attention at [facility]  where they received aspirin and nitroglycerin, which provided some relief from the chest pain. The patient has a history of COPD and heart issues and has been cardioverted in the past. The patient is currently under the care of a cardiologist, last seen a couple of months ago.    Medical history of hypertension, obesity, COPD, shortness of breath, diabetes, heart failure with grade 3 diastolic disease, CKD stage IV, A-fib on Eliquis. Patient's recorded medical, surgical, social, medication list and allergies were reviewed in the Snapshot window as part of the initial history.   Review of Systems   Review of Systems  Constitutional:  Negative for chills and fever.  HENT:  Negative for ear pain and sore throat.   Eyes:  Negative for pain and visual disturbance.  Respiratory:  Negative for cough and shortness of breath.   Cardiovascular:  Positive for chest pain and leg swelling. Negative for palpitations.  Gastrointestinal:  Negative for abdominal pain and vomiting.  Genitourinary:  Negative for dysuria and hematuria.   Musculoskeletal:  Negative for arthralgias and back pain.  Skin:  Negative for color change and rash.  Neurological:  Negative for seizures and syncope.  All other systems reviewed and are negative.   Physical Exam Updated Vital Signs BP (!) 136/48   Pulse (!) 57   Temp 98 F (36.7 C) (Oral)   Resp 13   Ht 5\' 2"  (1.575 m)   Wt 78 kg   SpO2 90%   BMI 31.46 kg/m  Physical Exam Vitals and nursing note reviewed.  Constitutional:      General: She is not in acute distress.    Appearance: She is well-developed.  HENT:     Head: Normocephalic and atraumatic.  Eyes:     Conjunctiva/sclera: Conjunctivae normal.  Cardiovascular:     Rate and Rhythm: Normal rate and regular rhythm.     Heart sounds: No murmur heard.    Comments: Mild leg swelling noted; patient wearing compression stockings Pulmonary:     Effort: Pulmonary effort is normal. No respiratory distress.     Breath sounds: Normal breath sounds.  Abdominal:     General: There is no distension.     Palpations: Abdomen is soft.     Tenderness: There is no abdominal tenderness. There is no right CVA tenderness or left CVA tenderness.  Musculoskeletal:        General: No swelling or tenderness. Normal range of motion.     Cervical back: Neck supple.  Skin:    General: Skin is warm and dry.  Neurological:     General:  No focal deficit present.     Mental Status: She is alert and oriented to person, place, and time. Mental status is at baseline.     Cranial Nerves: No cranial nerve deficit.      ED Course/ Medical Decision Making/ A&P Clinical Course as of 11/02/22 1835  Tue Nov 02, 2022  1812 2nd troponin [CC]    Clinical Course User Index [CC] Glyn Ade, MD    Procedures .Critical Care  Performed by: Glyn Ade, MD Authorized by: Glyn Ade, MD   Critical care provider statement:    Critical care time (minutes):  30   Critical care was necessary to treat or prevent imminent or  life-threatening deterioration of the following conditions:  Cardiac failure (Chest pain with a positive troponin)   Critical care was time spent personally by me on the following activities:  Development of treatment plan with patient or surrogate, discussions with consultants, evaluation of patient's response to treatment, examination of patient, ordering and review of laboratory studies, ordering and review of radiographic studies, ordering and performing treatments and interventions, pulse oximetry, re-evaluation of patient's condition and review of old charts    Medications Ordered in ED Medications  acetaminophen (TYLENOL) tablet 1,000 mg (1,000 mg Oral Given 11/02/22 1709)   Medical Decision Making: Stephanie Woodward is a 82 y.o. female who presented to the ED today with chest pain, detailed above.   Handoff received from EMS.  Additional history discussed with patient's family/caregivers.  Patient placed on continuous vitals and telemetry monitoring while in ED which was reviewed periodically.  Complete initial physical exam performed, notably the patient was HDS in NAD and asymptomatic at this time.   Reviewed and confirmed nursing documentation for past medical history, family history, social history.    Initial Assessment: With the patient's presentation of left-sided chest pain, most likely diagnosis is musculoskeletal chest pain versus GERD, although ACS remains on the differential. Other diagnoses were considered including (but not limited to) pulmonary embolism, community-acquired pneumonia, aortic dissection, pneumothorax, underlying bony abnormality, anemia. These are considered less likely due to history of present illness and physical exam findings.    In particular, concerning pulmonary embolism: Patient is PERC positive for age and the they deny malignancy, recent surgery, history of DVT, or calf tenderness leading to a low risk Wells score. Aortic Dissection also reconsidered but  seems less likely based on the location, quality, onset, and severity of symptoms in this case. Patient also has a lack of underlying history of AD or TAA.  This is most consistent with an acute life/limb threatening illness complicated by underlying chronic conditions.  Notably I reviewed patient's last heart cath from January of last year that demonstrated dominant RCA but they do not comment on any acute obstructive disease  Initial Plan: Evaluate for ACS with delta troponin and EKG evaluated as below  Evaluate for dissection, bony abnormality, or pneumonia with chest x-ray and screening laboratory evaluation including CBC, BMP  Further evaluation for pulmonary embolism not indicated at this time based on patient's PERC and Wells score.  Further evaluation for Thoracic Aortic Dissection not indicated at this time based on patient's clinical history and PE findings.   Initial Study Results: EKG was reviewed independently. Rate, rhythm, axis, intervals all examined and without medically relevant abnormality. ST segments without concerns for elevations.    Laboratory  Initial troponin demonstrated elevation approximately 10 ng/mL over prior.  Secondary troponin showed no acute abnormality.  CBC and BMP without obvious metabolic  or inflammatory abnormalities requiring further evaluation   Radiology  DG Chest 2 View  Result Date: 11/02/2022 CLINICAL DATA:  Midline chest pain since this morning. Hypertension and diabetic. Former smoker. EXAM: CHEST - 2 VIEW COMPARISON:  05/19/2022 FINDINGS: Lungs are adequately inflated demonstrate hazy prominence of the perihilar markings suggesting mild degree of vascular congestion/edema. No definite effusion. Cardiomediastinal silhouette and remainder of the exam is unchanged. IMPRESSION: Hazy prominence of the perihilar markings suggesting mild degree of vascular congestion/edema. Electronically Signed   By: Elberta Fortis M.D.   On: 11/02/2022 15:20     Final Assessment and Plan: Reassessed Ms. Lauryn at bedside.  Her symptoms remain resolved.  Shared medical decision making regarding admission versus discharge.  She is high risk for ACS in the next 30 days based on her history of similar in description of the event as well as positive troponin on presentation.  She expressed understanding of this but given resolution of symptoms she is requesting discharge at this time to follow-up with cardiology.  Again discussed risk of underlying heart disease and her elevated risk given her history however she would prefer to follow-up in outpatient setting.  Will treat as stable angina continue aspirin nitro as needed return with any further symptoms otherwise follow-up with cardiology in the outpatient setting.  Disposition:  I have considered need for hospitalization, however, considering all of the above, I believe this patient is stable for discharge at this time.  Patient/family educated about specific return precautions for given chief complaint and symptoms.  Patient/family educated about follow-up with PCP.     Patient/family expressed understanding of return precautions and need for follow-up. Patient spoken to regarding all imaging and laboratory results and appropriate follow up for these results. All education provided in verbal form with additional information in written form. Time was allowed for answering of patient questions. Patient discharged.    Emergency Department Medication Summary:   Medications  acetaminophen (TYLENOL) tablet 1,000 mg (1,000 mg Oral Given 11/02/22 1709)     Clinical Impression:  1. Chest pain, unspecified type   2. Elevated troponin      Discharge   Final Clinical Impression(s) / ED Diagnoses Final diagnoses:  Chest pain, unspecified type  Elevated troponin    Rx / DC Orders ED Discharge Orders          Ordered    Ambulatory referral to Cardiology        11/02/22 Marcy Salvo, MD 11/02/22 [phone]

## 2022-11-02 NOTE — ED Triage Notes (Signed)
Pt arrives via [facility] with reports of chest pain while riding the exercise bike. Pt was given 1 nitroglycerin and 324 Asprin. Pt reports no chest pain at this time.

## 2022-11-12 NOTE — Progress Notes (Signed)
Advanced Heart Failure Clinic Note  PCP: Tenna Child, DO HF Cardiology: Dr. Shirlee Latch Nephrology: Dr Signe Colt.   82 y.o.with history of ESRD, paroxysmal atrial fibrillation, and chronic diastolic CHF.  RHC/LHC was done in 10/15.  This showed markedly elevated filling pressures and pulmonary venous hypertension.  There was no significant CAD.  Last echo in 2/18 showed EF 50-55%, mild LVH, grade 2 diastolic dysfunction.   Admitted 10/2-10/6/19 with SOB. RHC showed elevated filling pressures and preserved CO. She was diuresed with IV lasix, then transitioned to torsemide 80 mg am, 40 mg pm. Repeat echo showed EF 45-50% with normal RV. PYP scan was negative for TTR amyloid. PFTs showed restrictive disease. CT chest showed no ILD but did show right hydronephrosis. Renal US showed single kidney with mild to moderate hydronephrosis on right (unchanged since 2011).  DC weight 208 lbs.    In 12/20, she progressed to ESRD and was started on HD.    Echo in 1/21 showed EF 55-60% with normal.  RHC/LHC in 4/21 showed no obstructive CAD, normal filling pressures, mild pulmonary hypertension.  She had atrial fibrillation in 9/21 and underwent DCCV to NSR.   4/22 was seen in ED for acute dyspnea. Found to be in rapid Afib. This was in setting of recent consumption of high caffeine energy drink. She was given IV Cardizem and converted back to NSR and was released same day.  Admitted 12/22 with atrial fibrillation with RVR. She was cardioverted in ED to SR with PACs. She was started on amio and Toprol. Unfortunately she was found unresponsive, intubated and transferred to ICU on NE. Code stroke activated. MRI brain without acute process. 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  She required CVVH but was able to transition to [facility] after extubation. Echo showed EF 30-35% range, worse function in septum; mild RV dysfunction, IVC dilated. She underwent  LHC showing no obstructive CAD. 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 She went back into afib requiring IV amiodarone and had successful DCCV 03/03/21.  At post hospital visit on 02/28/21, back in A fib. On 03/12/21 she underwent successful cardioversion. Echo 4/23 showed EF 55-60%, mild LVH, normal RV.   Coronary CTA in 11/23 to assess anomalous vessels showed a single large left coronary artery and nondominant RCA, no hemodynamically significant CAD by CT FFR.  Today she returns for HF follow up with her daughter. She is in NSR today.  Still having trouble with BP dropping at HD, taking midodrine prior to HD. She is doing PT at [facility] twice a week.  She uses a walker or wheelchair, still gets short of breath walking around her house.  No chest pain or palpitations.  Weight down 2 lbs.   ECG (personally reviewed): NSR, IVCD 132 msec  Labs (2/18): K 4, creatinine 1.79 Labs (10/19): K 4.5, creatinine 2.4 Labs (12/19): LDL 62 Labs (1/20): K 3.9, creatinine 2.72 Labs (12/20): hgb 11.9 Labs (9/21): LDL 64, HDL 53 Labs (4/21): SCr 4.19, K 3.8  Labs (1/23): K 3.6, creatinine 4.89, hgb 10.5 Labs (3/23): LDL 52 Labs (4/23): K 4.3, creatinine 5.60, LFTs normal, TSH normal Labs (3/24): hgb 10.5, LFTs normal, TSH normal  PMH: 1. Atrial fibrillation: Paroxysmal.  2. HTN 3. 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  5. ESRD 6. H/o IVCD 7. COPD: PFTs (10/19) actually showed severe restriction,  concern for interstitial process.  CT chest (10/19) showed mild-moderate patchy air trapping but no evidence for interstitial lung disease.  8. Gout 9. Chronic primarily diastolic CHF: Echo (2/18) with EF 50-55%, mild LVH, grade 2 diastolic dysfunction.  - LHC/RHC (10/15): small distal LAD but no discrete stenosis.  Mean RA 15, PA 55/20 mean 34, mean PCWP 31 => pulmonary  venous hypertension.  - PYP scan (10/19): No evidence for TTR amyloidosis.  - Echo (10/19): EF 45-50%, mild LVH, normal RV size and systolic function.  - RHC (10/19): mean RA 13, PA 74/27 mean 50, mean PCWP 28, CI 3.12, PVR 3.6 WU. Pulmonary venous hypertension.  - Echo (1/21): EF 55-60%, normal RV.  - LHC/RHC (4/21): super-dominant LCx with small LAD and RCA, no CAD; mean RA 5, PA 47/15, mean PCWP 10, CI 3.61, PVR 2.5 WU.  - Echo (1/23): EF 30-35% range, worse function in septum; mild RV dysfunction, IVC dilated.  - LHC (1/23): no obstructive CAD. Unusual coronary tree with a single large LCA and a small, nondominant RCA.   - Echo (4/23): EF 55-60%, mild LVH, normal RV.  - Coronary CTA in 11/23 to assess anomalous vessels showed a single large left coronary artery and nondominant RCA, no hemodynamically significant CAD by CT FFR. 10. Negative sleep study in 6/21.   Review of systems complete and found to be negative unless listed in HPI.    Social History   Socioeconomic History   Marital status: Widowed    Spouse name: Not on file   Number of children: 2   Years of education: Not on file   Highest education level: Not on file  Occupational History   Occupation: retired  Tobacco Use   Smoking status: Former    Current packs/day: 0.00    Average packs/day: 1 pack/day for 20.0 years (20.0 ttl pk-yrs)    Types: Cigarettes    Start date: 02/22/1989    Quit date: 02/22/2009    Years since quitting: 13.7   Smokeless tobacco: Never  Vaping Use   Vaping status: Never Used  Substance and Sexual Activity   Alcohol use: Yes    Alcohol/week: 0.0 standard drinks of alcohol    Comment: occasional beer   Drug use: Not Currently    Comment: marijuana in the past   Sexual activity: Never  Other Topics Concern   Not on file  Social History Narrative   Not on file   Social Determinants of Health   Financial Resource Strain: Low Risk  (02/27/2021)   Overall Financial Resource Strain (CARDIA)     Difficulty of Paying Living Expenses: Not very hard  Food Insecurity: No Food Insecurity (05/20/2022)   Hunger Vital Sign    Worried About Running Out of Food in the Last Year: Never true    Ran Out of Food in the Last Year: Never true  Transportation Needs: No Transportation Needs (05/20/2022)   PRAPARE - Administrator, Civil Service (Medical): No    Lack of Transportation (Non-Medical): No  Physical Activity: Not on file  Stress: Not on file  Social Connections: Not on file  Intimate Partner Violence: Not At Risk (05/20/2022)   Humiliation, Afraid, Rape, and Kick questionnaire    Fear of Current or Ex-Partner: No    Emotionally Abused: No    Physically Abused: No    Sexually Abused: No   Family History  Problem Relation Age of Onset   Lupus Daughter    Cancer Mother  85       type unknown   CVA Father 23   Cancer Brother 45       type unknown   Review of systems complete and found to be negative unless listed in HPI.    Current Outpatient Medications  Medication Sig Dispense Refill   acetaminophen (TYLENOL) 500 MG tablet Take 1,000 mg by mouth in the morning and at bedtime.     albuterol (PROVENTIL HFA;VENTOLIN HFA) 108 (90 BASE) MCG/ACT inhaler Inhale 2 puffs into the lungs in the morning and at bedtime.     amiodarone (PACERONE) 200 MG tablet Take 200 mg by mouth daily.     atorvastatin (LIPITOR) 80 MG tablet Take 80 mg by mouth at bedtime.     budesonide-formoterol (SYMBICORT) 160-4.5 MCG/ACT inhaler Inhale 2 puffs into the lungs 2 (two) times daily. 1 each 0   Cholecalciferol (VITAMIN D3) 25 MCG (1000 UT) tablet Take 1,000 Units by mouth daily.     cyanocobalamin (VITAMIN B12) 500 MCG tablet Take 500 mcg by mouth daily.     diphenhydrAMINE (BENADRYL) 25 mg capsule Take 25 mg by mouth every 6 (six) hours as needed for itching.     ELIQUIS 5 MG TABS tablet Take 1 tablet by mouth twice daily 180 tablet 3   Insulin Glargine (SEMGLEE [place]) Inject 16 Units into the  skin in the morning.     Insulin Pen Needle 32G X 4 MM MISC 1 Device by Does not apply route in the morning, at noon, in the evening, and at bedtime. 400 each 3   latanoprost (XALATAN) 0.005 % ophthalmic solution Place 1 drop into both eyes at bedtime.     lidocaine (LINDAMANTLE) 3 % CREA cream Apply 1 application topically every Monday, Wednesday, and Friday. Prior to dialysis     Lidocaine 4 % PTCH Place 1 patch onto the skin daily as needed (for pain). 1/2 patch applied to shoulder or back     Menthol, Topical Analgesic, (BIOFREEZE EX) Apply 1 application  topically in the morning and at bedtime.     midodrine (PROAMATINE) 10 MG tablet Take 10 mg by mouth 3 (three) times a week. Taken before dialysis on Monday, Wednesday, Friday Hold if SBP>130     pantoprazole (PROTONIX) 40 MG tablet Take 1 tablet (40 mg total) by mouth 2 (two) times daily.     Pramoxine-Calamine (AVEENO ANTI-ITCH EX) Apply 1 application topically every 6 (six) hours as needed (itching).     No current facility-administered medications for this visit.   There were no vitals taken for this visit.  Wt Readings from Last 3 Encounters:  11/02/22 78 kg (172 lb)  09/16/22 78 kg (172 lb)  07/20/22 79.1 kg (174 lb 6.4 oz)   General: NAD Neck: JVP 10 cm, no thyromegaly or thyroid nodule.  Lungs: Clear to auscultation bilaterally with normal respiratory effort. CV: Nondisplaced PMI.  Heart regular S1/S2, no S3/S4, no murmur.  1+ ankle edema.  No carotid bruit.  Normal pedal pulses.  Abdomen: Soft, nontender, no hepatosplenomegaly, no distention.  Skin: Intact without lesions or rashes.  Neurologic: Alert and oriented x 3.  Psych: Normal affect. Extremities: No clubbing or cyanosis.  HEENT: Normal.   Assessment/Plan: 1.  Atrial fibrillation: Paroxysmal AF.  Seen in ED at [facility] 12/22 for AF and spontaneously converted to SR.  Presented 02/17/21 with AF with RVR. Cardioverted in ER.  She was in junctional rhythm  after respiratory arrest and intubation with rate  upper 40s-50s. Rhythm on 02/24/21  appeared to be wandering atrial pacemaker, and amiodarone stopped.  Reverted back to AF with RVR and amiodarone restarted. S/p DCCV 03/03/21 and 03/12/21.  She appears to have remained in NSR since then, NSR today.  - Continue amiodarone 200 mg daily.  Check LFTs and TSH today.  She will need a regular eye exam.   - Continue Eliquis 5 mg bid. CBC today. .  2. Chronic systolic CHF: Echo in 2/18 with EF 50-55%, mild LVH, grade 2 diastolic dysfunction.  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  RHC 11/23/17 showed significantly elevated right and left heart filling pressures and severe primarily pulmonary venous hypertension. Cardiac output preserved. Echo (10/19) with EF 45-50%, septal-lateral dyssynchrony, RV looked ok.  PYP scan was not suggestive of transthyretin amyloidosis.  Echo in 1/21 with EF 55-60%, normal RV.  RHC/LHC in 4/21 with no significant CAD, normal filling pressures and cardiac output.  Echo 1/23 showed EF down to 35%.  HS-TnI 385 => 378, likely demand ischemia with hypotension/respiratory arrest.  LHC showed no obstructive CAD, Unusual coronary tree with a single large left coronary and nondominant RCA, anatomy confirmed by coronary CTA in 11/23.  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  Chronic NYHA class IIIb symptoms, likely that deconditioning plays a large role but she is also volume overloaded on exam.   - Continue midodrine pre-HD.  - She needs more fluid removed via HD, may have to go 4 days/week if BP is limiting more ultrafiltration at a given session.   3. ESRD: Tolerating iHD.  - Continue midodrine pre-HD.  4. Pulmonary HTN: Mild PH on 4/21 RHC.  5. Hyperlipidemia: Continue statin.  - Check lipids today.  6. DM II: She is on insulin. 7. CAD: Coronary CTA in  11/23 with no hemodynamically significant CAD by FFR.  - Continue statin as above.   Followup 6 months with APP  [facility] 11/12/2022

## 2022-11-16 ENCOUNTER — Encounter (HOSPITAL_COMMUNITY): Payer: Self-pay

## 2022-11-16 ENCOUNTER — Ambulatory Visit (HOSPITAL_COMMUNITY)
Admission: RE | Admit: 2022-11-16 | Discharge: 2022-11-16 | Disposition: A | Discharge status: Home / Self Care | Payer: Medicare (Managed Care) | Source: Ambulatory Visit | Attending: Family Medicine | Admitting: Family Medicine

## 2022-11-16 VITALS — BP 120/66 | HR 56 | Ht 62.0 in | Wt 171.4 lb

## 2022-11-16 DIAGNOSIS — N186 End stage renal disease: Secondary | ICD-10-CM | POA: Diagnosis not present

## 2022-11-16 DIAGNOSIS — I272 Pulmonary hypertension, unspecified: Secondary | ICD-10-CM | POA: Diagnosis not present

## 2022-11-16 DIAGNOSIS — E1122 Type 2 diabetes mellitus with diabetic chronic kidney disease: Secondary | ICD-10-CM | POA: Diagnosis not present

## 2022-11-16 DIAGNOSIS — E785 Hyperlipidemia, unspecified: Secondary | ICD-10-CM | POA: Insufficient documentation

## 2022-11-16 DIAGNOSIS — Z992 Dependence on renal dialysis: Secondary | ICD-10-CM | POA: Insufficient documentation

## 2022-11-16 DIAGNOSIS — I480 Paroxysmal atrial fibrillation: Secondary | ICD-10-CM | POA: Insufficient documentation

## 2022-11-16 DIAGNOSIS — E118 Type 2 diabetes mellitus with unspecified complications: Secondary | ICD-10-CM

## 2022-11-16 DIAGNOSIS — I5022 Chronic systolic (congestive) heart failure: Secondary | ICD-10-CM | POA: Insufficient documentation

## 2022-11-16 DIAGNOSIS — R9431 Abnormal electrocardiogram [ECG] [EKG]: Secondary | ICD-10-CM | POA: Diagnosis not present

## 2022-11-16 DIAGNOSIS — Z794 Long term (current) use of insulin: Secondary | ICD-10-CM | POA: Diagnosis not present

## 2022-11-16 DIAGNOSIS — I2510 Atherosclerotic heart disease of native coronary artery without angina pectoris: Secondary | ICD-10-CM | POA: Diagnosis not present

## 2022-11-16 DIAGNOSIS — I5032 Chronic diastolic (congestive) heart failure: Secondary | ICD-10-CM | POA: Diagnosis present

## 2022-11-16 NOTE — Progress Notes (Signed)
ReDS Vest / Clip - 11/16/22 1500       ReDS Vest / Clip   Station Marker A    Ruler Value 28.5    ReDS Value Range Moderate volume overload    ReDS Actual Value 40

## 2022-11-16 NOTE — Patient Instructions (Signed)
Medication Changes:  No Changes In Medications at this time.   Special Instructions // Education:  PRESCRIPTION GIVEN FOR COMPRESSION STOCKINGS  Follow-Up in: 6 MONTHS WITH DR. Shirlee Latch PLEASE CALL OUR OFFICE AROUND JANUARY TO GET SCHEDULED FOR YOUR APPOINTMENT. PHONE NUMBER IS [phone] OPTION 2    At the [facility], you and your health needs are our priority. We have a designated team specialized in the treatment of Heart Failure. This Care Team includes your primary Heart Failure Specialized Cardiologist (physician), Advanced Practice Providers (APPs- Physician Assistants and Nurse Practitioners), and Pharmacist who all work together to provide you with the care you need, when you need it.   You may see any of the following providers on your designated Care Team at your next follow up:  Dr. Arvilla Meres Dr. Marca Ancona Dr. Marcos Eke, NP Robbie Lis, [place] [facility] [place], [place] Brynda Peon, NP Karle Plumber, PharmD   Please be sure to bring in all your medications bottles to every appointment.   Need to Contact us:  If you have any questions or concerns before your next appointment please send [place] a message through [place] or call our office at [phone].    TO LEAVE A MESSAGE FOR THE NURSE SELECT OPTION 2, PLEASE LEAVE A MESSAGE INCLUDING: YOUR NAME DATE OF BIRTH CALL BACK NUMBER REASON FOR CALL**this is important as we prioritize the call backs  YOU WILL RECEIVE A CALL BACK THE SAME DAY AS LONG AS YOU CALL BEFORE 4:00 PM

## 2022-12-27 IMAGING — DX DG WRIST COMPLETE 3+V*R*
4 series · 4 of 4 positions shown · non-contrast
Comparison: None Available.

CLINICAL DATA: Fall with wrist pain

EXAM:
RIGHT WRIST - COMPLETE 3+ VIEW

[wrist pa (1 of 2)]
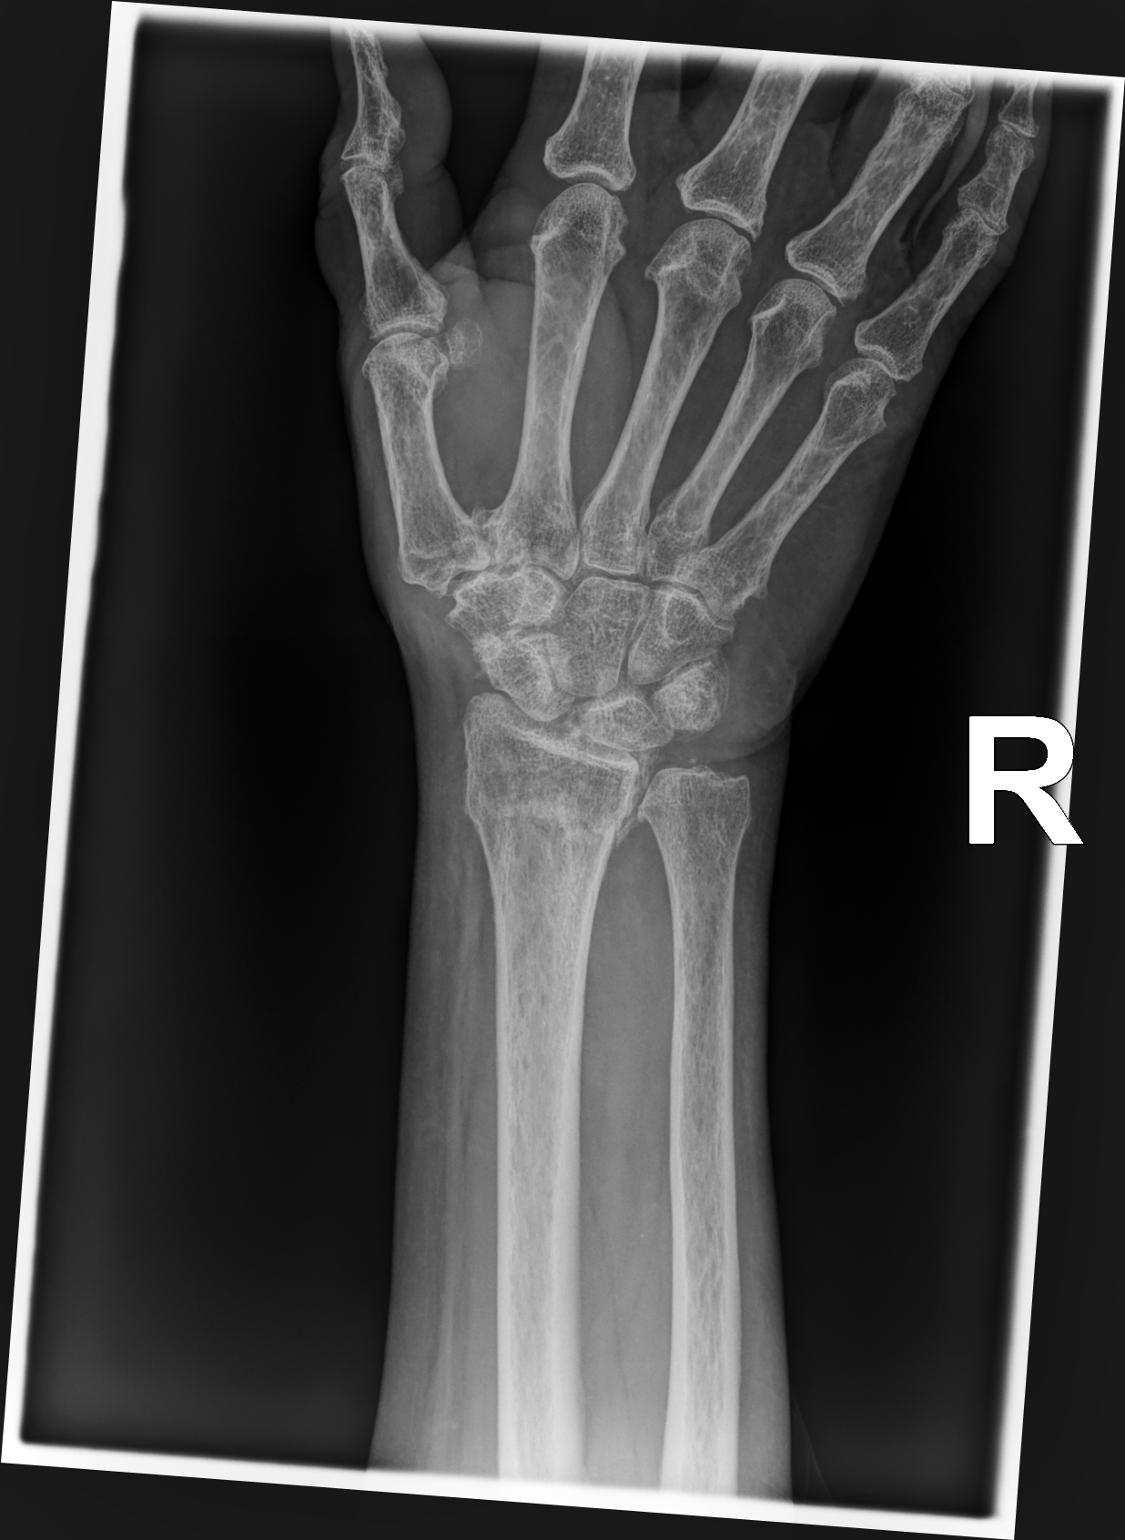

[wrist pa (2 of 2)]
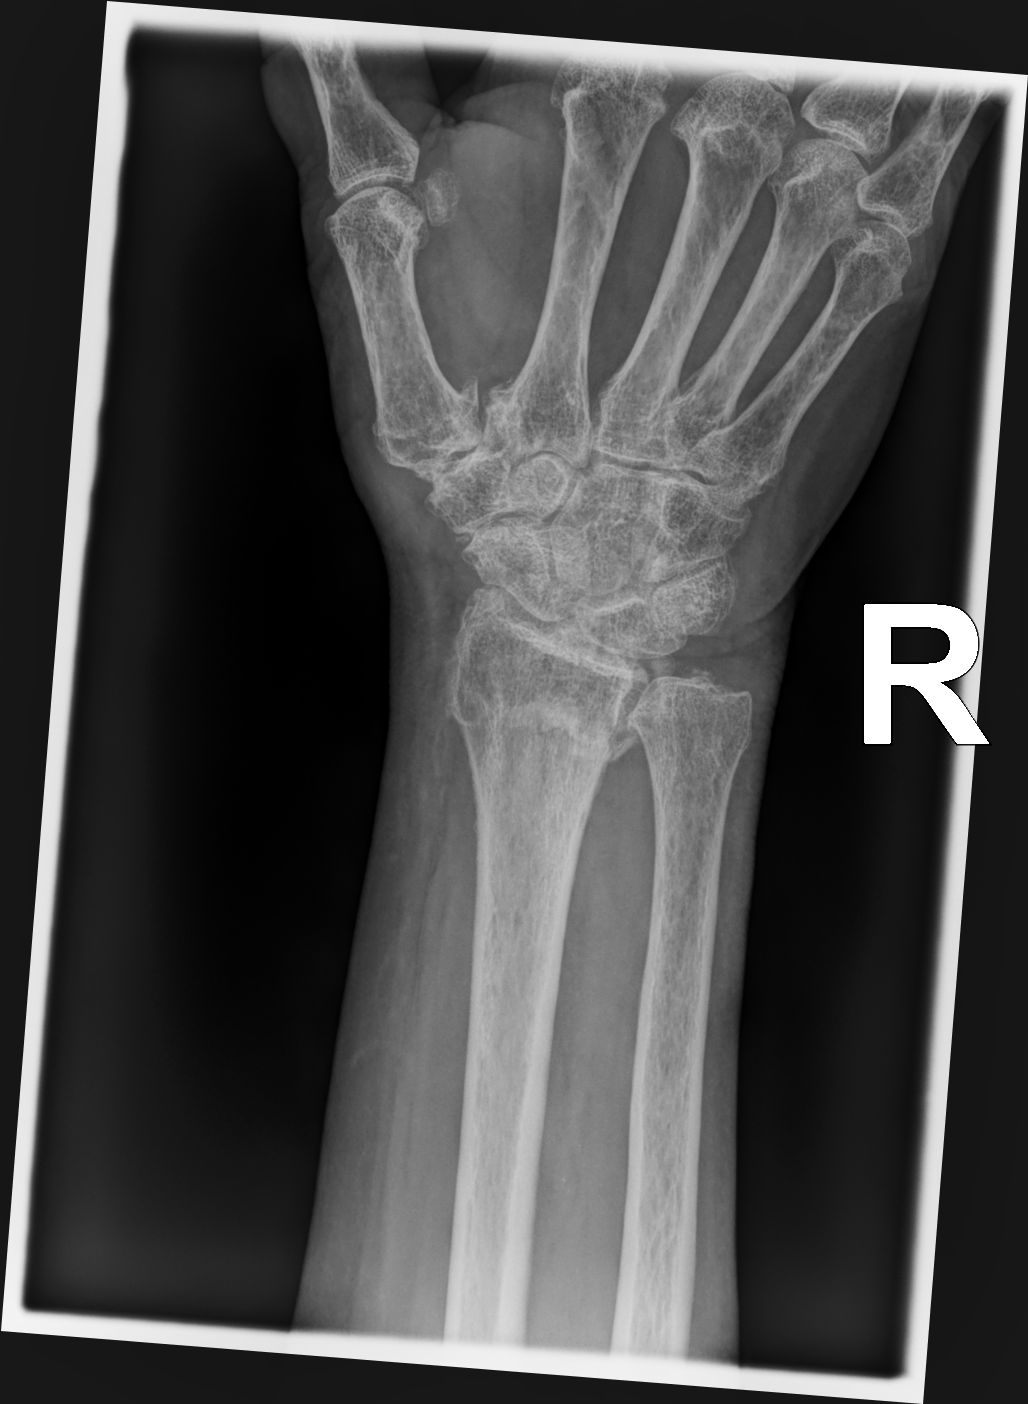

[wrist lat]
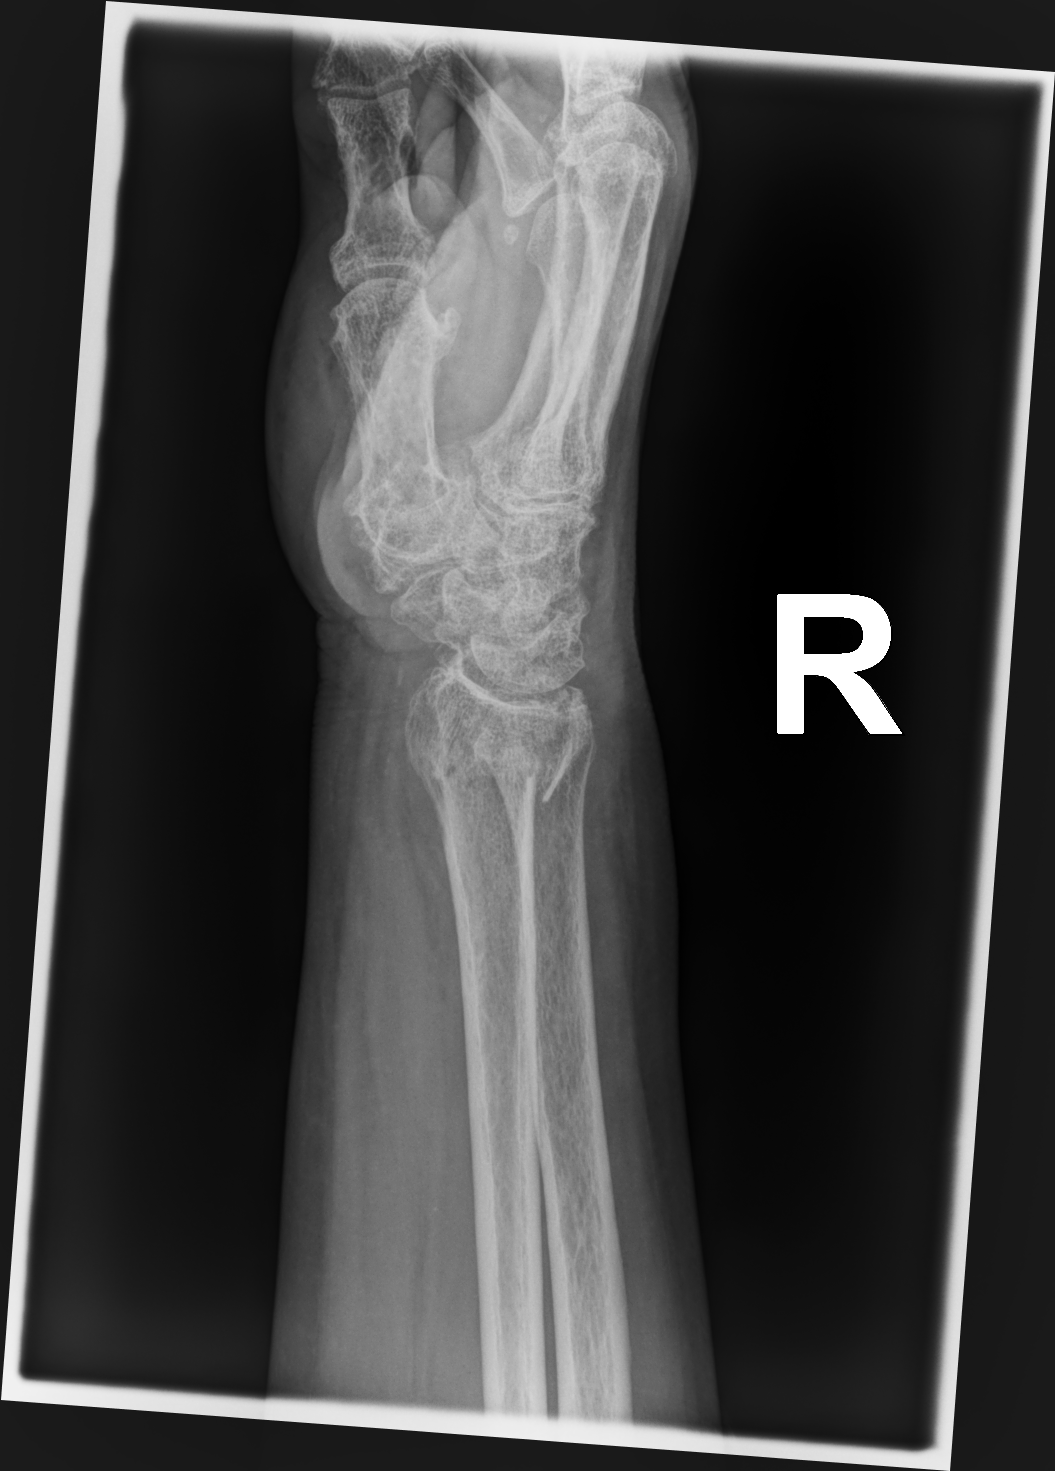

[scaphoid (os scaphoideum i) pa]
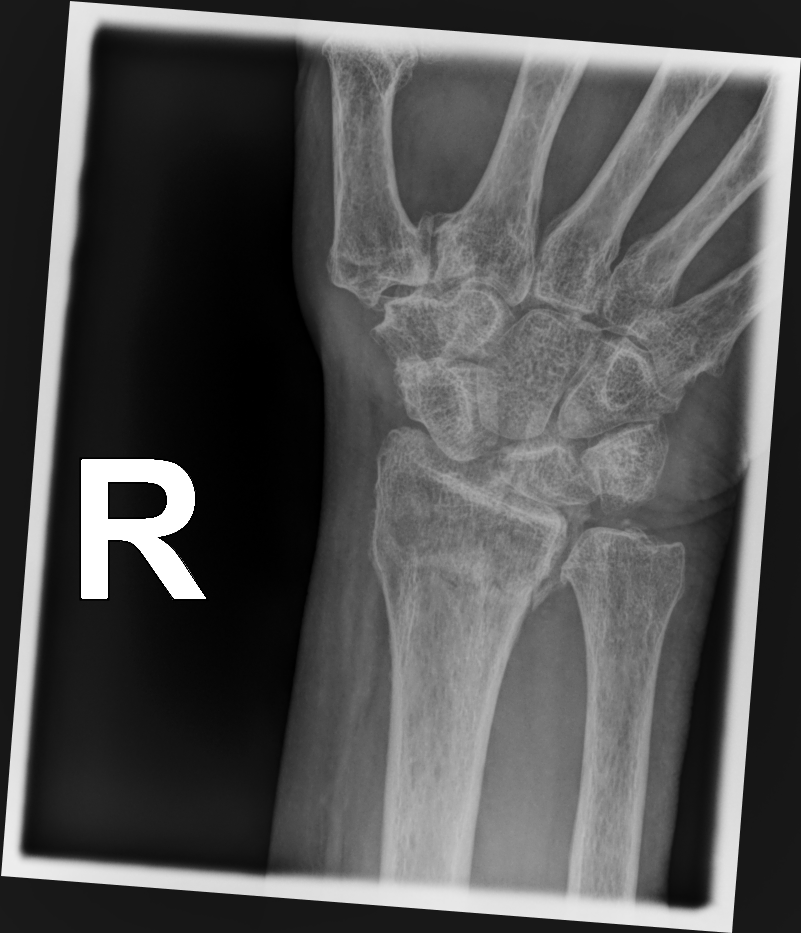

[4 of 4 positions shown; findings below may reference images not displayed]

FINDINGS: Acute impacted and mildly comminuted intra-articular distal radius
fracture with mild dorsal displacement and angulation. Advanced
arthritis at the first CMC joint and STT interval. Positive for soft
tissue swelling
IMPRESSION: Acute mildly comminuted, displaced and impacted intra-articular
distal radius fracture

## 2022-12-27 IMAGING — CT CT HEAD W/O CM
4 series · 16 of 47 positions shown, 18 images · non-contrast
Comparison: 06/07/2021

CLINICAL DATA: Moderate to severe head trauma after a fall. Blood
thinners.



[Series 2: head bone · axial · 0.39mm/px · z∈[+826,+856]mm · 3 of 76 slices shown]
[im 8/76  bone]
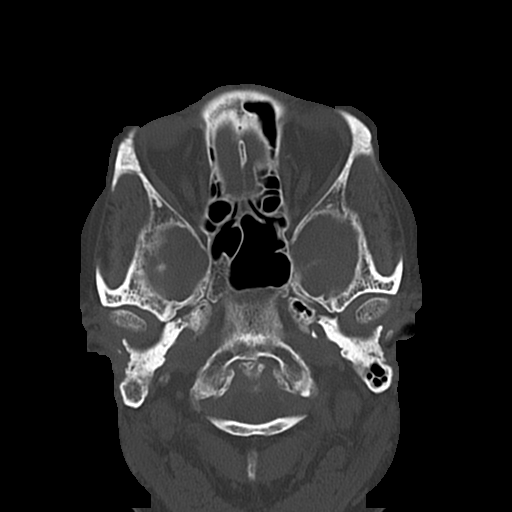
[im 16/76  bone]
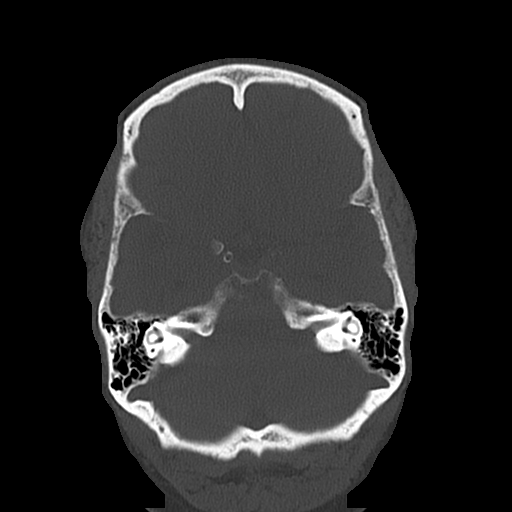
[im 23/76  bone]
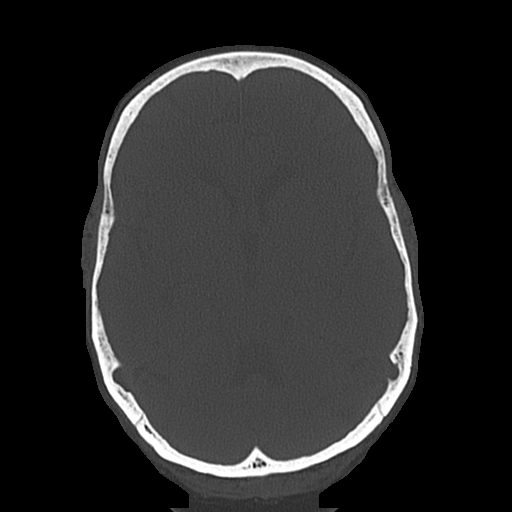

[Series 3: head wo · axial · 0.39mm/px · z∈[+827,+942]mm · 7 of 31 slices shown, 9 images]
[im 4/31  brain]
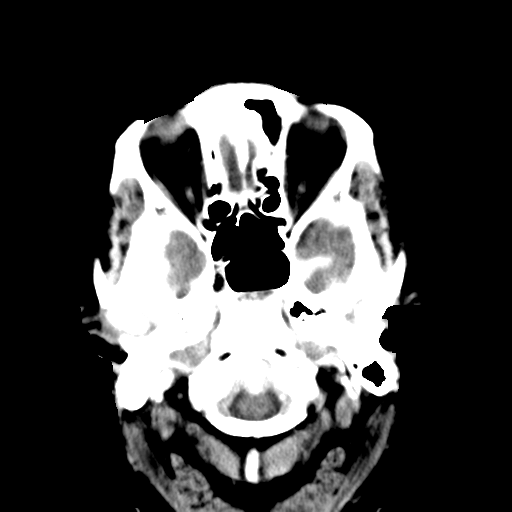
[im 4/31  bone]
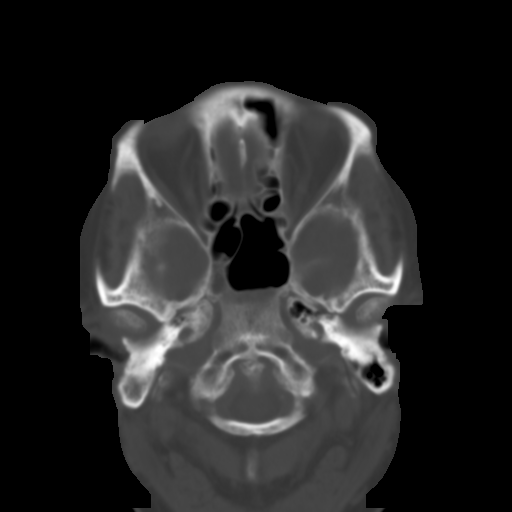
[im 8/31  brain]
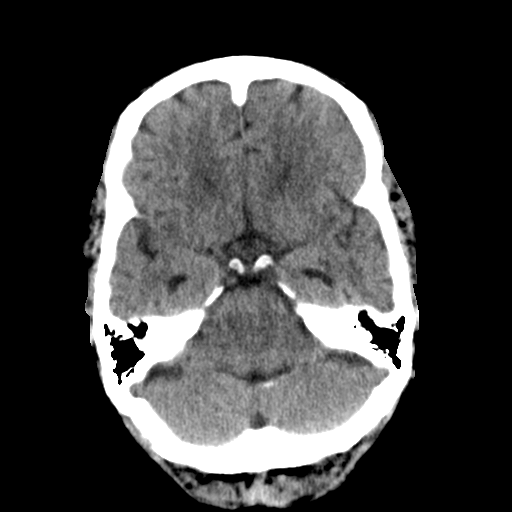
[im 12/31  brain]
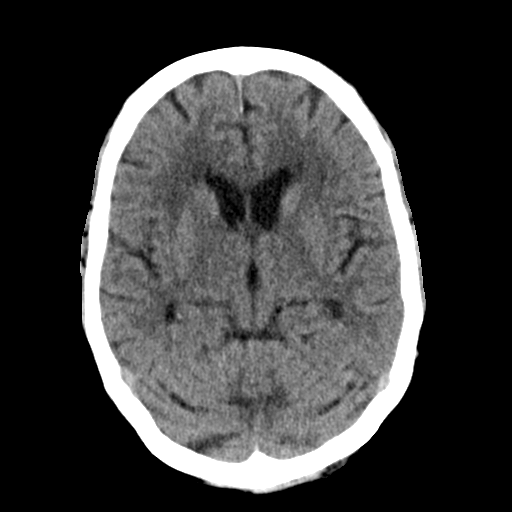
[im 16/31  brain]
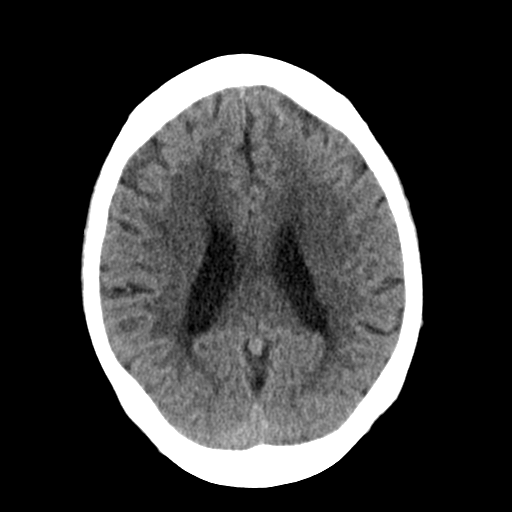
[im 19/31  brain]
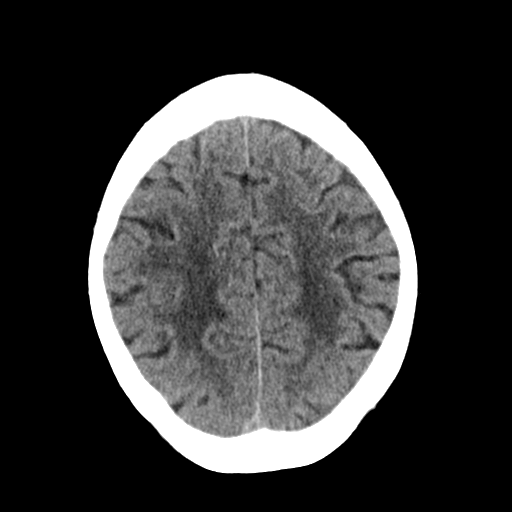
[im 19/31  bone]
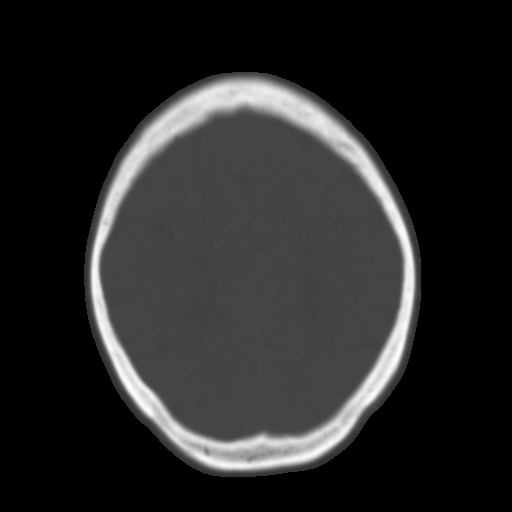
[im 23/31  brain]
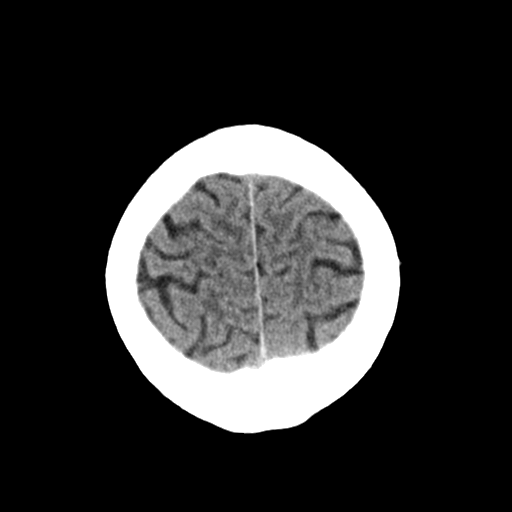
[im 27/31  brain]
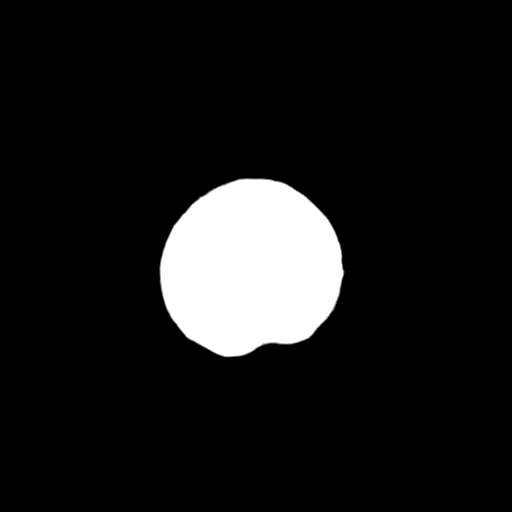

[Series 4: coronal soft · coronal · 0.29mm/px · 3 of 64 slices shown]
[im 22/64  brain]
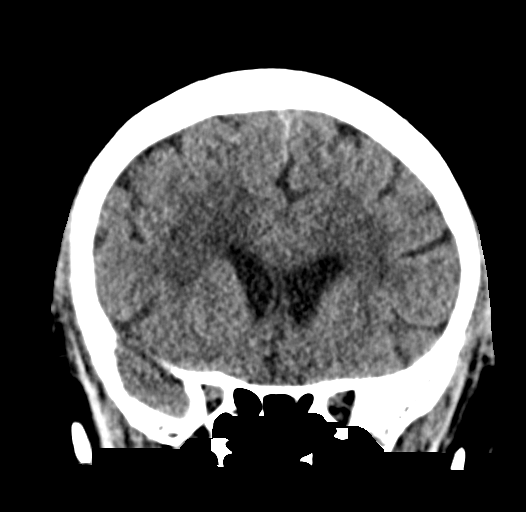
[im 29/64  brain]
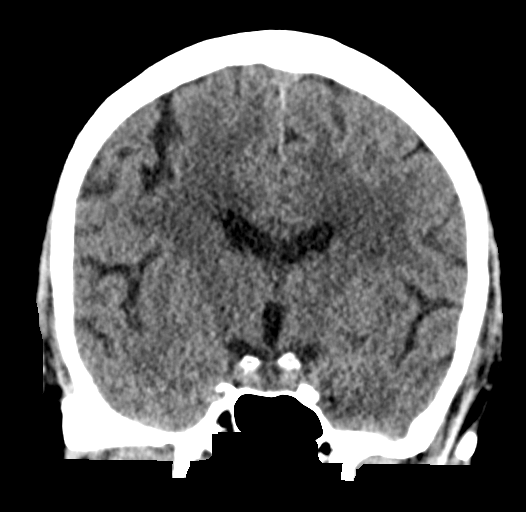
[im 36/64  brain]
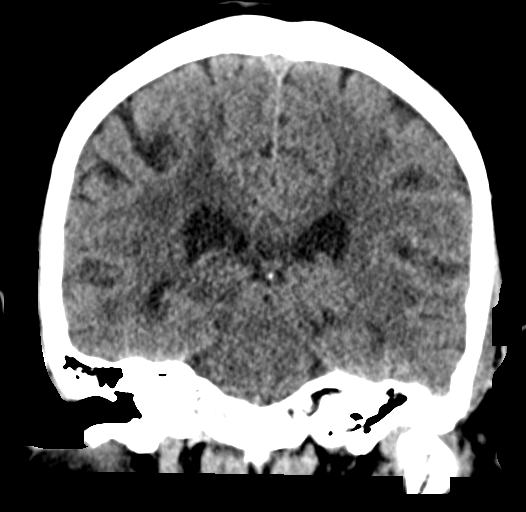

[Series 5: sagittal soft · sagittal · 0.29mm/px · 3 of 52 slices shown]
[im 18/52  brain]
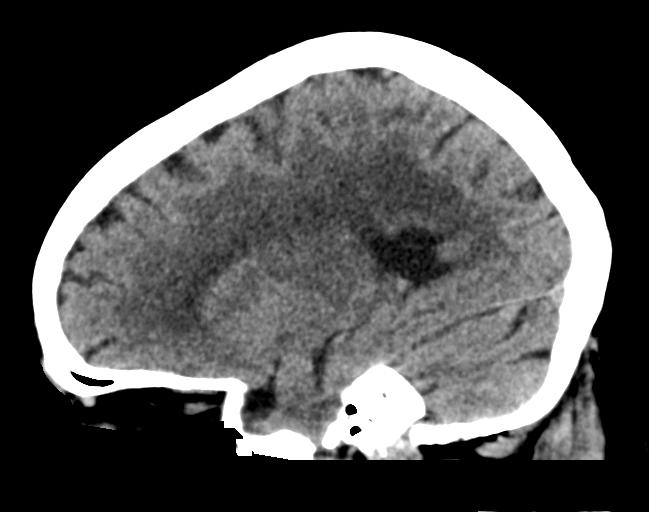
[im 26/52  brain]
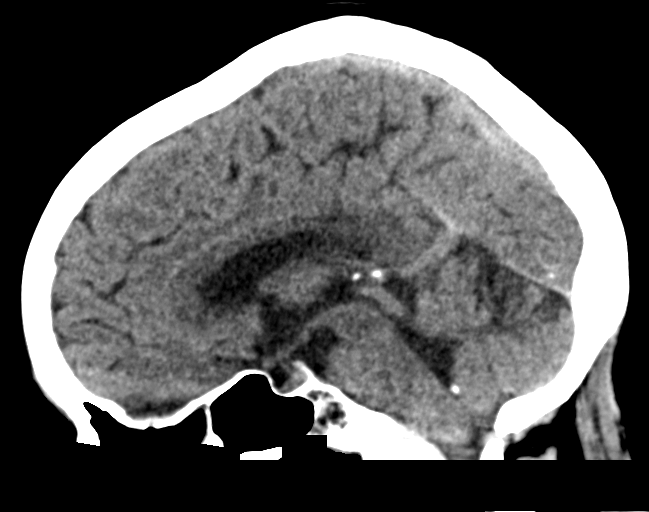
[im 35/52  brain]
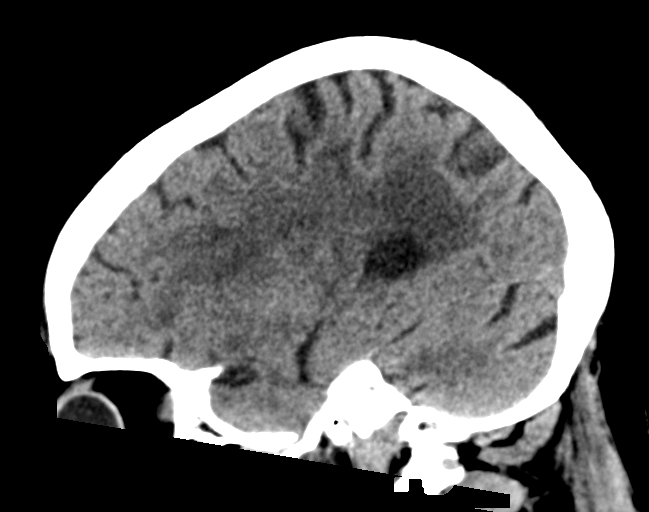

[16 of 47 positions shown; findings below may reference images not displayed]

FINDINGS: Brain: Mild cerebral atrophy. Mild ventricular dilatation consistent
with central atrophy. Low-attenuation changes in the deep white
matter likely due to small vessel ischemic change. No mass effect or
midline shift. No abnormal extra-axial fluid collections. The
gray-white matter junctions are distinct. The basal cisterns are not
effaced. No acute intracranial hemorrhage.

Vascular: Prominent vascular calcifications.

Skull: Calvarium appears intact.

Sinuses/Orbits: Paranasal sinuses and mastoid air cells are clear.

Other: None.
IMPRESSION: No acute intracranial abnormalities. Chronic atrophy and small
vessel ischemic changes.

## 2023-01-13 ENCOUNTER — Ambulatory Visit: Payer: Medicare (Managed Care) | Admitting: Neurology

## 2023-01-13 ENCOUNTER — Encounter: Payer: Self-pay | Admitting: Neurology

## 2023-01-13 VITALS — BP 132/58 | HR 59 | Ht 62.0 in | Wt 169.0 lb

## 2023-01-13 DIAGNOSIS — G250 Essential tremor: Secondary | ICD-10-CM | POA: Diagnosis not present

## 2023-01-13 DIAGNOSIS — G3184 Mild cognitive impairment, so stated: Secondary | ICD-10-CM | POA: Diagnosis not present

## 2023-01-13 NOTE — Progress Notes (Signed)
[facility]  PATIENT: Stephanie Woodward DOB: 30-Apr-1940  REQUESTING CLINICIAN: Windell Norfolk, MD HISTORY FROM: Patient and daughter  REASON FOR VISIT: Memory deficit    HISTORICAL  CHIEF COMPLAINT:  Chief Complaint  Patient presents with   Memory Loss    Rm 13 with daughter  Pt is well, reports her memory cognition has improved since last visit as well as her tremor. No new concerns    INTERVAL HISTORY 01/13/2023:  Patient presents today for follow-up, she is accompanied by her daughter.  Last visit was in February at that time I started her on propranolol for tremors.  With the propranolol, she did have bouts of hypotension, therefore PCP discontinued the medication.  She tells me that she is doing better, she still is struggling with short-term memory but otherwise she is better and her tremors also have improved off medication.  She is worried and anxious that her daughter is moving to [place] for work but she is also happy about her new position.   INTERVAL HISTORY 04/15/2022:  Patient presents today for follow-up, she is accompanied by her daughter.  She is still going to dialysis 3 days a week and pace once a week.  Patient feels that her memory is stable.  Most days after dialysis she is very drained and and seem to be more confused but otherwise she is fine.  Daughter reported her short-term memory is getting worse, she is more forgetful.  And today for the first time patient shared with daughter that she saw a cats and dogs underneath her bed.  She does not have any pets.  She does have home health care that comes 5 days a week for at least 5 hours.  Due to physical limitations she needs help with bathing and putting her clothes on but she can still pick up clothes she wants to wear, she can fix a sandwich, and daughter stated that her conversation usually are normal, she might have forgetfulness but otherwise she can keep up with conversation.     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 Patient reports memory problems started after her hospitalization in December.  She went to the hospital in December for A-fib, hospitalization was complicated by respiratory distress requiring ventilation, admission to ICU and rehab for about 3 to 4 months.  While in rehab she was on tramadol for pain and was confused, having hallucination and these symptoms subsided once the tramadol was discontinued.  While in Rehab also, she got infected with COVID.   Since April 22 she has been living with her daughter and since then family has reported some improvement in the memory but is still not back to baseline.  She still getting confused going to familiar places, and her main problem is forgetting what she was doing.  She gives the example of she can be in the kitchen, then decided to go to the refrigerator and when she opens the refrigerator she does not know why she opened her refrigerator and she will go back to the kitchen and forget what she was doing there. Seems like there is a problem with attention.    She still working with [facility] 2 days a week, getting physical therapy and Occupational Therapy and family is also trying to get speech.  She has a primary care  doctor has a Child psychotherapist and still getting dialysis 3 days a week.  While in rehab she had falls, CT has been negative for any intracranial abnormality.  Since being home no report of falls.    Her main goal is to go back to her independent living facility.    TBI:   No past history of TBI but had recent falls  Stroke:   no past history of stroke Seizures:   no past history of seizures Sleep:   no history of sleep apnea.   Mood:   patient denies anxiety and depression but reports low mood  Functional  status: Dependent in some ADLs Patient lives with daughter   Cooking: no  Cleaning: sometimes Shopping: no Bathing: needs help Toileting: needs help Driving: no Bills: no  Ever left the stove on by accident?: currently not cooking Forget how to use items around the house?: no Getting lost going to familiar places?: yes Forgetting loved ones names?: no Word finding difficulty? no Sleep: ok   OTHER MEDICAL CONDITIONS: Atrial fibrillation, CHF, Hypertension, hyperlipidemia, DMII, ESRD on Dialysis MWF, back pain, glaucoma   REVIEW OF SYSTEMS: Full 14 system review of systems performed and negative with exception of: as noted in the HPI  ALLERGIES: Allergies  Allergen Reactions   Egg-Derived Products Nausea And Vomiting   Lisinopril Nausea And Vomiting   Penicillins Nausea And Vomiting    Has patient had a PCN reaction causing immediate rash, facial/tongue/throat swelling, SOB or lightheadedness with hypotension: No Has patient had a PCN reaction causing severe rash involving mucus membranes or skin necrosis: No Has patient had a PCN reaction that required hospitalization: No Has patient had a PCN reaction occurring within the last 10 years: No If all of the above answers are "NO", then may proceed with Cephalosporin use.    Other Other (See Comments)    HOME MEDICATIONS: Outpatient Medications Prior to Visit  Medication Sig Dispense Refill   acetaminophen (TYLENOL) 500 MG tablet Take 1,000 mg by mouth in the morning and at bedtime.     amiodarone (PACERONE) 200 MG tablet Take 200 mg by mouth daily.     atorvastatin (LIPITOR) 80 MG tablet Take 80 mg by mouth at bedtime.     Cholecalciferol (VITAMIN D3) 25 MCG (1000 UT) tablet Take 1,000 Units by mouth daily.     ELIQUIS 5 MG TABS tablet Take 1 tablet by mouth twice daily 180 tablet 3   Insulin Glargine (SEMGLEE [place]) Inject 16 Units into the skin in the morning.     Insulin Pen Needle 32G X 4 MM MISC 1 Device by Does not apply  route in the morning, at noon, in the evening, and at bedtime. 400 each 3   lidocaine (LINDAMANTLE) 3 % CREA cream Apply 1 application  topically every Monday, Wednesday, and Friday. Prior to dialysis     Lidocaine 4 % PTCH Place 1 patch onto the skin daily as needed (for pain). 1/2 patch applied to shoulder or back     midodrine (PROAMATINE) 10 MG tablet Take 10 mg by mouth 3 (three) times a week. Taken before dialysis on Monday, Wednesday, Friday Hold if SBP>130     nitroGLYCERIN (NITROSTAT) 0.4 MG SL tablet Place 0.4 mg under the tongue every 5 (five) minutes as needed for chest pain.     pantoprazole (PROTONIX) 40 MG tablet Take 1 tablet (40 mg total) by mouth 2 (two) times daily.     Pramoxine-Calamine (AVEENO ANTI-ITCH EX) Apply  1 application topically every 6 (six) hours as needed (itching). (Patient not taking: Reported on 01/13/2023)     albuterol (PROVENTIL HFA;VENTOLIN HFA) 108 (90 BASE) MCG/ACT inhaler Inhale 2 puffs into the lungs in the morning and at bedtime. (Patient not taking: Reported on 01/13/2023)     budesonide-formoterol (SYMBICORT) 160-4.5 MCG/ACT inhaler Inhale 2 puffs into the lungs 2 (two) times daily. (Patient not taking: Reported on 01/13/2023) 1 each 0   cyanocobalamin (VITAMIN B12) 500 MCG tablet Take 500 mcg by mouth daily. (Patient not taking: Reported on 01/13/2023)     diphenhydrAMINE (BENADRYL) 25 mg capsule Take 25 mg by mouth every 6 (six) hours as needed for itching. (Patient not taking: Reported on 01/13/2023)     latanoprost (XALATAN) 0.005 % ophthalmic solution Place 1 drop into both eyes at bedtime. (Patient not taking: Reported on 01/13/2023)     Menthol, Topical Analgesic, (BIOFREEZE EX) Apply 1 application  topically in the morning and at bedtime. (Patient not taking: Reported on 01/13/2023)     No facility-administered medications prior to visit.    PAST MEDICAL HISTORY: Past Medical History:  Diagnosis Date   Arthritis    Asthma    Atrial  fibrillation (HCC)    CHF (congestive heart failure) (HCC)    CKD (chronic kidney disease), stage III (HCC)    COPD (chronic obstructive pulmonary disease) (HCC)    Diabetes mellitus    INSULIN DEPENDENT   Dialysis patient (HCC)    Gout    Heart murmur    no issues per pt   History of kidney stones    Hyperlipemia    Hypertension    Psoriasis    Renal disorder    congenital   Single kidney    Sleep apnea    doesn't use the Cpap    PAST SURGICAL HISTORY: Past Surgical History:  Procedure Laterality Date   A/V FISTULAGRAM Left 10/31/2020   Procedure: A/V FISTULAGRAM;  Surgeon: Chuck Hint, MD;  Location: [facility] INVASIVE CV LAB;  Service: Cardiovascular;  Laterality: Left;   ABDOMINAL HYSTERECTOMY     AV FISTULA PLACEMENT Left 11/17/2018   Procedure: BRACHIO-CEPHALIC ARTERIOVENOUS (AV) FISTULA CREATION IN LEFT ARM;  Surgeon: Cephus Shelling, MD;  Location: [facility] OR;  Service: Vascular;  Laterality: Left;   CARDIOVERSION N/A 03/03/2021   Procedure: CARDIOVERSION;  Surgeon: Laurey Morale, MD;  Location: [facility] ENDOSCOPY;  Service: Cardiovascular;  Laterality: N/A;   CARDIOVERSION N/A 03/12/2021   Procedure: CARDIOVERSION;  Surgeon: Laurey Morale, MD;  Location: [facility] ENDOSCOPY;  Service: Cardiovascular;  Laterality: N/A;   CHOLECYSTECTOMY     INSERTION OF DIALYSIS CATHETER Right 01/26/2019   Procedure: INSERTION OF DIALYSIS CATHETER, right internal jugular;  Surgeon: Chuck Hint, MD;  Location[facility] OR;  Service: Vascular;  Laterality: Right;   LEFT AND RIGHT HEART CATHETERIZATION WITH CORONARY ANGIOGRAM N/A 11/29/2013   Procedure: LEFT AND RIGHT HEART CATHETERIZATION WITH CORONARY ANGIOGRAM;  Surgeon: Othella Boyer, MD;  Location: [facility] CATH LAB;  Service: Cardiovascular;  Laterality: N/A;   LEFT HEART CATH AND CORONARY ANGIOGRAPHY N/A 02/24/2021   Procedure: LEFT HEART CATH AND CORONARY ANGIOGRAPHY;  Surgeon: Laurey Morale, MD;  Location: [facility] INVASIVE CV LAB;  Service:  Cardiovascular;  Laterality: N/A;   PERIPHERAL VASCULAR INTERVENTION Left 10/31/2020   Procedure: PERIPHERAL VASCULAR INTERVENTION;  Surgeon: Chuck Hint, MD;  Location: [facility] INVASIVE CV LAB;  Service: Cardiovascular;  Laterality: Left;   RIGHT HEART CATH N/A 11/23/2017   Procedure:  RIGHT HEART CATH;  Surgeon: Laurey Morale, MD;  Location: [facility] INVASIVE CV LAB;  Service: Cardiovascular;  Laterality: N/A;   RIGHT/LEFT HEART CATH AND CORONARY ANGIOGRAPHY N/A 06/15/2019   Procedure: RIGHT/LEFT HEART CATH AND CORONARY ANGIOGRAPHY;  Surgeon: Laurey Morale, MD;  Location: [facility] INVASIVE CV LAB;  Service: Cardiovascular;  Laterality: N/A;    FAMILY HISTORY: Family History  Problem Relation Age of Onset   Lupus Daughter    Cancer Mother 21       type unknown   CVA Father 75   Cancer Brother 14       type unknown    SOCIAL HISTORY: Social History   Socioeconomic History   Marital status: Widowed    Spouse name: Not on file   Number of children: 2   Years of education: Not on file   Highest education level: Not on file  Occupational History   Occupation: retired  Tobacco Use   Smoking status: Former    Current packs/day: 0.00    Average packs/day: 1 pack/day for 20.0 years (20.0 ttl pk-yrs)    Types: Cigarettes    Start date: 02/22/1989    Quit date: 02/22/2009    Years since quitting: 13.8   Smokeless tobacco: Never  Vaping Use   Vaping status: Never Used  Substance and Sexual Activity   Alcohol use: Yes    Alcohol/week: 0.0 standard drinks of alcohol    Comment: occasional beer   Drug use: Not Currently    Comment: marijuana in the past   Sexual activity: Never  Other Topics Concern   Not on file  Social History Narrative   Not on file   Social Determinants of Health   Financial Resource Strain: Low Risk  (02/27/2021)   Overall Financial Resource Strain (CARDIA)    Difficulty of Paying Living Expenses: Not very hard  Food Insecurity: No Food Insecurity (05/20/2022)    Hunger Vital Sign    Worried About Running Out of Food in the Last Year: Never true    Ran Out of Food in the Last Year: Never true  Transportation Needs: No Transportation Needs (05/20/2022)   PRAPARE - Administrator, Civil Service (Medical): No    Lack of Transportation (Non-Medical): No  Physical Activity: Not on file  Stress: Not on file  Social Connections: Not on file  Intimate Partner Violence: Not At Risk (05/20/2022)   Humiliation, Afraid, Rape, and Kick questionnaire    Fear of Current or Ex-Partner: No    Emotionally Abused: No    Physically Abused: No    Sexually Abused: No    PHYSICAL EXAM  GENERAL EXAM/CONSTITUTIONAL: Vitals:  Vitals:   01/13/23 1146  BP: (!) 132/58  Pulse: (!) 59  Weight: 169 lb (76.7 kg)  Height: 5\' 2"  (1.575 m)    Body mass index is 30.91 kg/m. Wt Readings from Last 3 Encounters:  01/13/23 169 lb (76.7 kg)  11/16/22 171 lb 6.4 oz (77.7 kg)  11/02/22 172 lb (78 kg)   Patient is in no distress; well developed, nourished and groomed; neck is supple  MUSCULOSKELETAL: Gait, strength, tone, movements noted in Neurologic exam below  NEUROLOGIC: MENTAL STATUS:      No data to display            04/15/2022   11:49 AM 07/09/2021    2:10 PM  Montreal Cognitive Assessment   Visuospatial/ Executive (0/5) 0 3  Naming (0/3) 1 1  Attention: Read list  of digits (0/2) 1 2  Attention: Read list of letters (0/1) 0 1  Attention: Serial 7 subtraction starting at 100 (0/3) 2 1  Language: Repeat phrase (0/2) 2 1  Language : Fluency (0/1) 1 1  Abstraction (0/2) 1 2  Delayed Recall (0/5) 5 0  Orientation (0/6) 5 6  Total 18 18  Adjusted Score (based on education) 18      CRANIAL NERVE:  2nd, 3rd, 4th, 6th - visual fields full to confrontation, extraocular muscles intact, no nystagmus 5th - facial sensation symmetric 7th - facial strength symmetric 8th - hearing intact 9th - palate elevates symmetrically, uvula midline 11th  - shoulder shrug symmetric 12th - tongue protrusion midline  MOTOR:  normal bulk and tone, at least antigravity   SENSORY:  normal and symmetric to light touch  COORDINATION:  No tremors seen on evaluation today  GAIT/STATION:  Deferred   DIAGNOSTIC DATA (LABS, IMAGING, TESTING) - I reviewed patient records, labs, notes, testing and imaging myself where available.  Lab Results  Component Value Date   WBC 8.1 11/02/2022   HGB 12.7 11/02/2022   HCT 40.7 11/02/2022   MCV 98.8 11/02/2022   PLT 208 11/02/2022      Component Value Date/Time   NA 140 11/02/2022 1317   K 3.6 11/02/2022 1317   CL 94 (L) 11/02/2022 1317   CO2 34 (H) 11/02/2022 1317   GLUCOSE 314 (H) 11/02/2022 1317   BUN 18 11/02/2022 1317   CREATININE 5.24 (H) 11/02/2022 1317   CALCIUM 9.2 11/02/2022 1317   PROT 6.8 09/16/2022 1001   ALBUMIN 3.2 (L) 09/16/2022 1001   AST 16 09/16/2022 1001   ALT 15 09/16/2022 1001   ALKPHOS 119 09/16/2022 1001   BILITOT 0.7 09/16/2022 1001   GFRNONAA 8 (L) 11/02/2022 1317   GFRAA 9 (L) 10/24/2019 1607   Lab Results  Component Value Date   CHOL 122 09/16/2022   HDL 65 09/16/2022   LDLCALC 46 09/16/2022   TRIG 57 09/16/2022   CHOLHDL 1.9 09/16/2022   Lab Results  Component Value Date   HGBA1C 7.9 (H) 05/19/2022   Lab Results  Component Value Date   VITAMINB12 818 04/15/2022   Lab Results  Component Value Date   TSH 2.311 09/16/2022    Head CT 06/07/21 1. No acute intracranial hemorrhage. 2. Atrophy with chronic microvascular ischemic changes.   ATN profile not consistent with Alzheimer dementia   ASSESSMENT AND PLAN  82 y.o. 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  Patient feels that her memory is stable even though she still struggle with short term memory. Her ATN was not consistent with  Alzheimer dementia pathology.  Tremors also improved off medication. Will continue to monitor. Follow up in a year or sooner if worse.    1. Mild cognitive impairment   2. Essential tremor      Patient Instructions  Continue current medications  Continue to follow up PCP  Follow up in a year    There are well-accepted and sensible ways to reduce risk for Alzheimers disease and other degenerative brain disorders .  Exercise Daily Walk A daily 20 minute walk should be part of your routine. Disease related apathy can be a significant roadblock to exercise and the only way to overcome this is to make it a daily routine and perhaps have a reward at the  end (something your loved one loves to eat or drink perhaps) or a personal trainer coming to the home can also be very useful. Most importantly, the patient is much more likely to exercise if the caregiver / spouse does it with him/her. In general a structured, repetitive schedule is best.  General Health: Any diseases which effect your body will effect your brain such as a pneumonia, urinary infection, blood clot, heart attack or stroke. Keep contact with your primary care doctor for regular follow ups.  Sleep. A good nights sleep is healthy for the brain. Seven hours is recommended. If you have insomnia or poor sleep habits we can give you some instructions. If you have sleep apnea wear your mask.  Diet: Eating a heart healthy diet is also a good idea; fish and poultry instead of red meat, nuts (mostly non-peanuts), vegetables, fruits, olive oil or canola oil (instead of butter), minimal salt (use other spices to flavor foods), whole grain rice, bread, cereal and pasta and wine in moderation.Research is now showing that the MIND diet, which is a combination of The Mediterranean diet and the DASH diet, is beneficial for cognitive processing and longevity. Information about this diet can be found in The MIND Diet, a book by Alonna Minium, MS, RDN, and  online at [employer]  Finances, Power of [address] and Advance Directives: You should consider putting legal safeguards in place with regard to financial and medical decision making. While the spouse always has power of attorney for medical and financial issues in the absence of any form, you should consider what you want in case the spouse / caregiver is no longer around or capable of making decisions.   No orders of the defined types were placed in this encounter.   No orders of the defined types were placed in this encounter.   Return in about 1 year (around 01/13/2024).     Windell Norfolk, MD 01/13/2023, 2:17 PM  [facility] [address] [place], [place] [address] [phone]

## 2023-01-13 NOTE — Patient Instructions (Addendum)
Continue current medications  Continue to follow up PCP  Follow up in a year    There are well-accepted and sensible ways to reduce risk for Alzheimers disease and other degenerative brain disorders .  Exercise Daily Walk A daily 20 minute walk should be part of your routine. Disease related apathy can be a significant roadblock to exercise and the only way to overcome this is to make it a daily routine and perhaps have a reward at the end (something your loved one loves to eat or drink perhaps) or a personal trainer coming to the home can also be very useful. Most importantly, the patient is much more likely to exercise if the caregiver / spouse does it with him/her. In general a structured, repetitive schedule is best.  General Health: Any diseases which effect your body will effect your brain such as a pneumonia, urinary infection, blood clot, heart attack or stroke. Keep contact with your primary care doctor for regular follow ups.  Sleep. A good nights sleep is healthy for the brain. Seven hours is recommended. If you have insomnia or poor sleep habits we can give you some instructions. If you have sleep apnea wear your mask.  Diet: Eating a heart healthy diet is also a good idea; fish and poultry instead of red meat, nuts (mostly non-peanuts), vegetables, fruits, olive oil or canola oil (instead of butter), minimal salt (use other spices to flavor foods), whole grain rice, bread, cereal and pasta and wine in moderation.Research is now showing that the MIND diet, which is a combination of The Mediterranean diet and the DASH diet, is beneficial for cognitive processing and longevity. Information about this diet can be found in The MIND Diet, a book by Alonna Minium, MS, RDN, and online at [employer]  Finances, Power of [address] and Advance Directives: You should consider putting legal safeguards in place with regard to financial and medical decision making. While  the spouse always has power of attorney for medical and financial issues in the absence of any form, you should consider what you want in case the spouse / caregiver is no longer around or capable of making decisions.

## 2023-01-18 IMAGING — US US PELVIS LIMITED
1 series · 13 of 25 positions shown · non-contrast
Comparison: 04/25/2021

CLINICAL DATA: Bilateral adnexal cysts.  Status post hysterectomy.

EXAM:
TRANSABDOMINAL ULTRASOUND OF PELVIS
TECHNIQUE: Transabdominal ultrasound examination of the pelvis was performed
including evaluation of the uterus, ovaries, adnexal regions, and
pelvic cul-de-sac.

[Series 1: us pelvis limited (transabdominal only) · 13 of 34 slices shown]
[im 1/34]
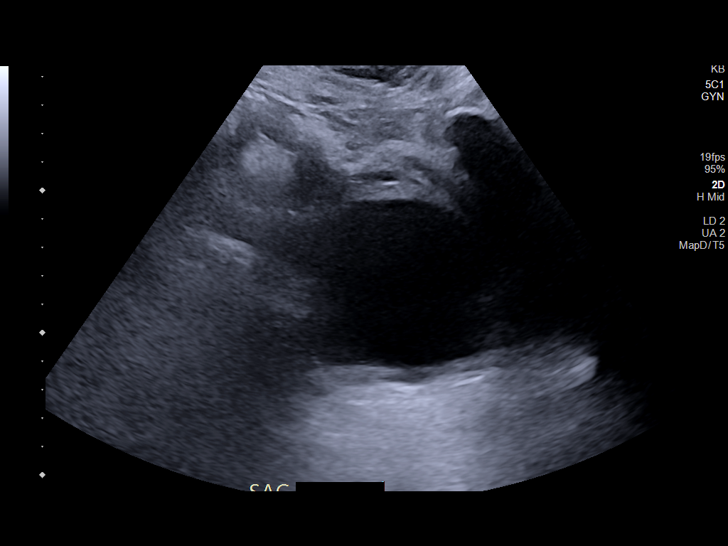
[im 3/34]
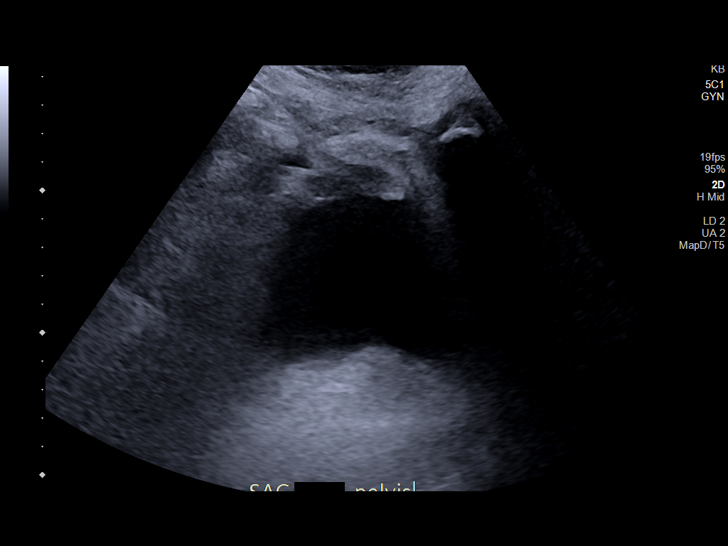
[im 6/34]
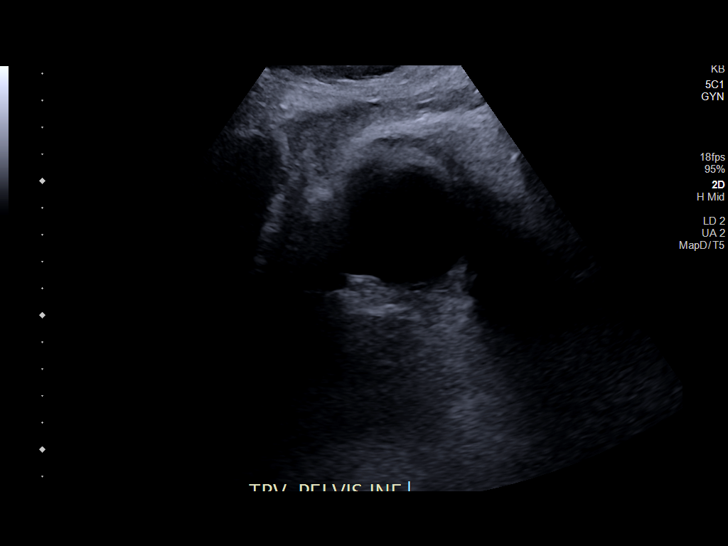
[im 9/34]
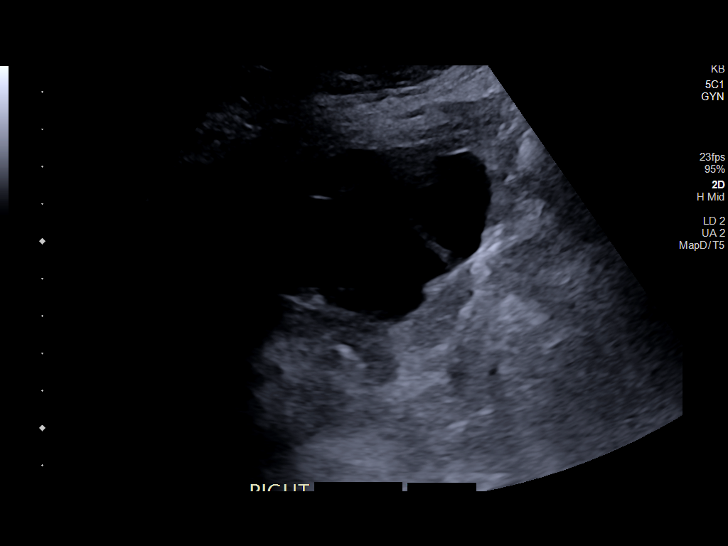
[im 12/34]
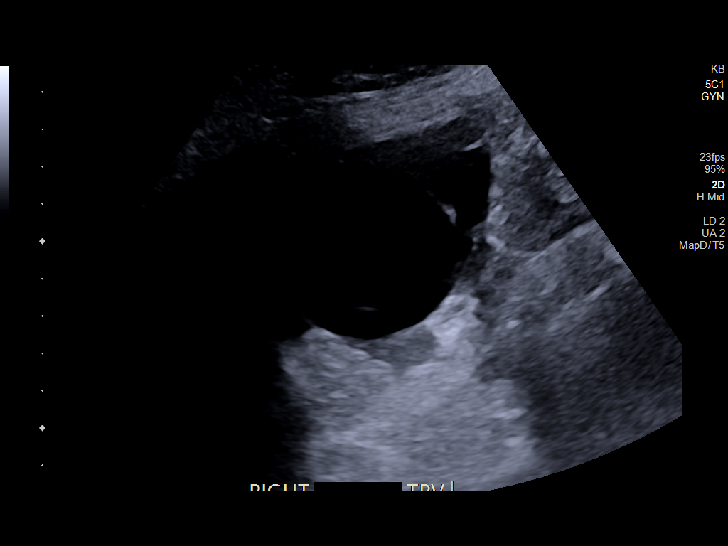
[im 14/34]
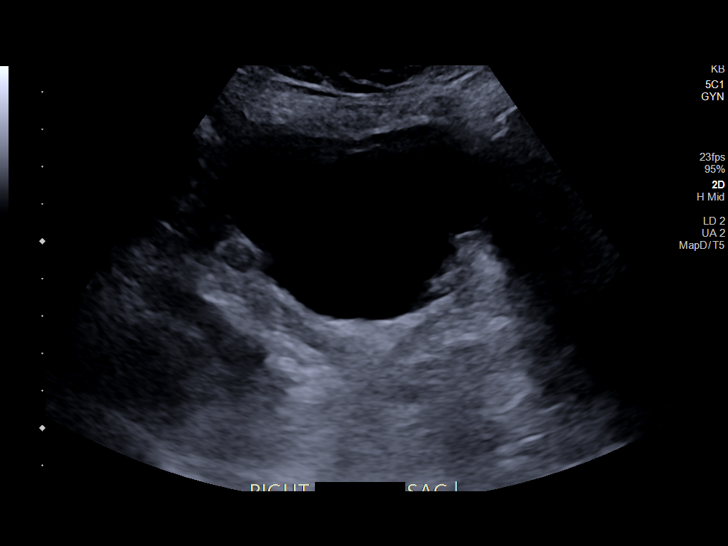
[im 17/34]
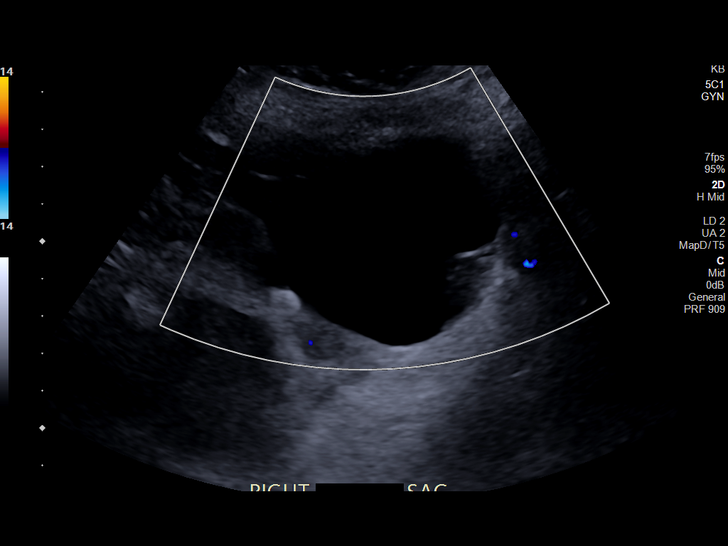
[im 20/34]
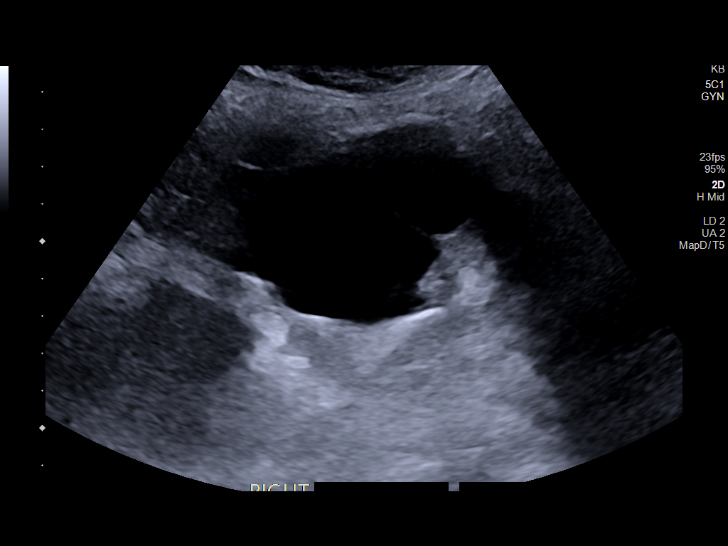
[im 23/34]
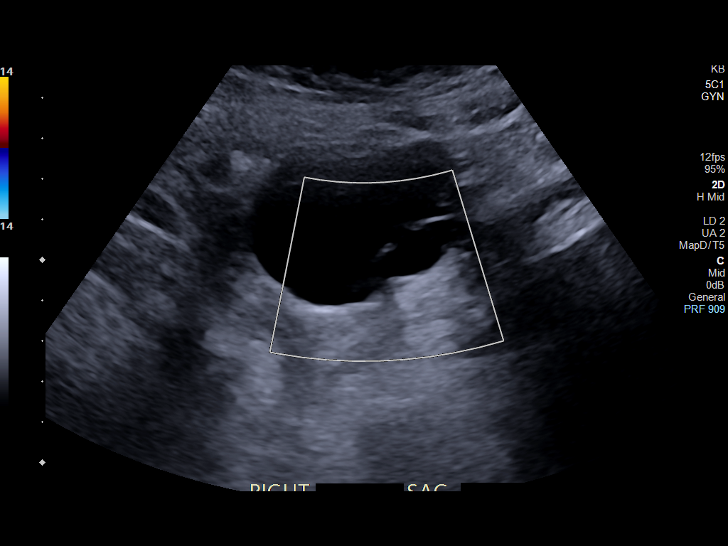
[im 25/34]
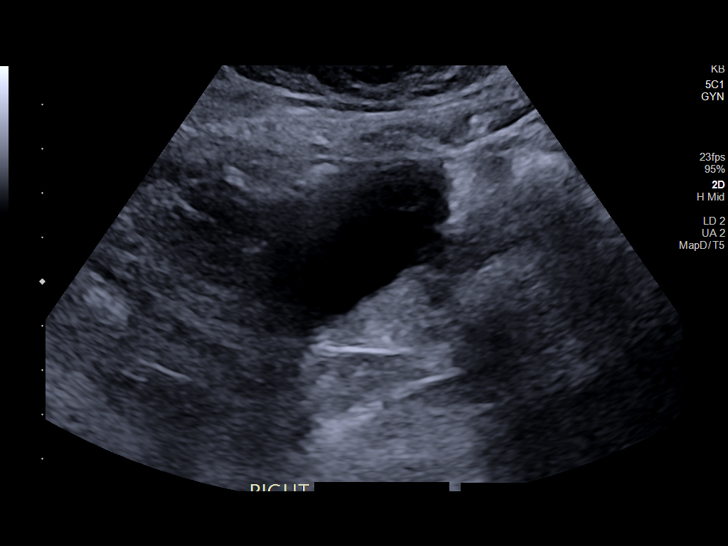
[im 28/34]
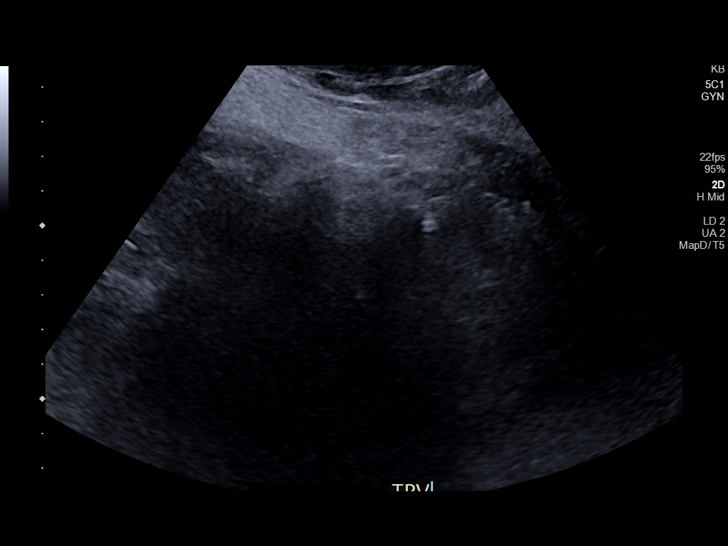
[im 31/34]
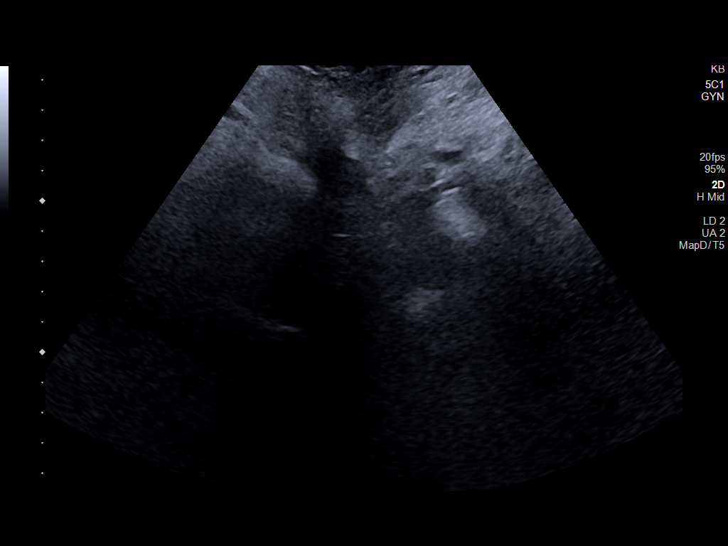
[im 34/34]
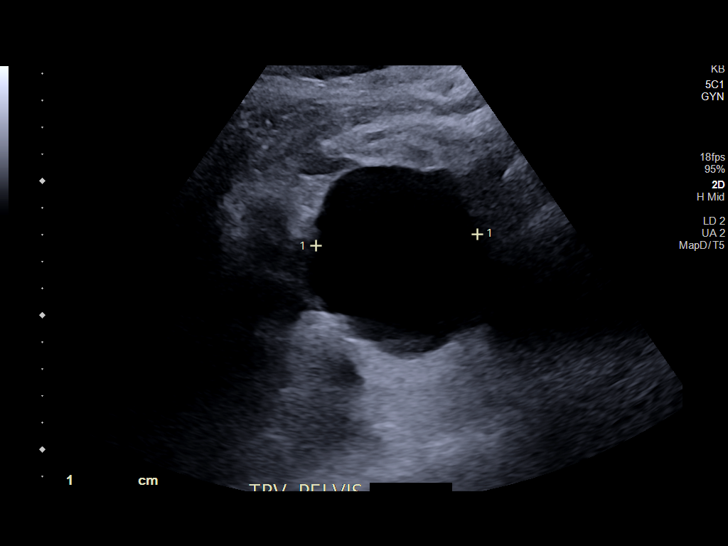

[13 of 25 positions shown; findings below may reference images not displayed]

FINDINGS: Uterus

Absent

Right ovary

Not visualized. A lobulated cystic lesion is seen within the right
adnexa measuring 7.4 x 5.5 x 5.7 cm demonstrating a few thin
noncalcified internal septa on measuring less than 2 mm in
thickness. No associated internal vascularity, mural nodularity, or
irregular thickening. This appears stable when compared to prior
examination.

Left ovary

Not visualized. Within the midline pelvis is a simple cyst measuring
5.5 x 6.4 x 6.0 cm. This appears slightly decreased in size since
prior examination.

Other findings:  No abnormal free fluid.
IMPRESSION: 1. Stable right adnexal cystic lesion measuring up to 7.4 cm.
2. Slight interval decrease in size of a simple cyst within the
midline pelvis measuring up to 6.4 cm.
3. MRI examination may be considered for more accurate assessment of
these lesions. Given their apparent stability, however, annual
evaluation with sonography could also be considered to document
continued stability.
4. Status post hysterectomy.

## 2023-01-23 ENCOUNTER — Emergency Department (HOSPITAL_COMMUNITY): Payer: Medicare (Managed Care)

## 2023-01-23 ENCOUNTER — Encounter (HOSPITAL_COMMUNITY): Payer: Self-pay

## 2023-01-23 ENCOUNTER — Other Ambulatory Visit: Payer: Self-pay

## 2023-01-23 ENCOUNTER — Emergency Department (HOSPITAL_COMMUNITY)
Admission: EM | Admit: 2023-01-23 | Discharge: 2023-01-23 | Disposition: A | Discharge status: Home / Self Care | Payer: Medicare (Managed Care)

## 2023-01-23 DIAGNOSIS — Z7901 Long term (current) use of anticoagulants: Secondary | ICD-10-CM | POA: Insufficient documentation

## 2023-01-23 DIAGNOSIS — M25512 Pain in left shoulder: Secondary | ICD-10-CM | POA: Diagnosis not present

## 2023-01-23 DIAGNOSIS — W050XXA Fall from non-moving wheelchair, initial encounter: Secondary | ICD-10-CM | POA: Diagnosis not present

## 2023-01-23 DIAGNOSIS — R9082 White matter disease, unspecified: Secondary | ICD-10-CM | POA: Diagnosis not present

## 2023-01-23 DIAGNOSIS — M79602 Pain in left arm: Secondary | ICD-10-CM | POA: Insufficient documentation

## 2023-01-23 DIAGNOSIS — W1830XA Fall on same level, unspecified, initial encounter: Secondary | ICD-10-CM | POA: Insufficient documentation

## 2023-01-23 DIAGNOSIS — S0990XA Unspecified injury of head, initial encounter: Secondary | ICD-10-CM | POA: Insufficient documentation

## 2023-01-23 DIAGNOSIS — Z794 Long term (current) use of insulin: Secondary | ICD-10-CM | POA: Diagnosis not present

## 2023-01-23 MED ORDER — ACETAMINOPHEN 500 MG PO TABS
1000.0000 mg | ORAL_TABLET | Freq: Once | ORAL | Status: AC
  Administered 2023-01-23: 1000 mg via ORAL
  Filled 2023-01-23: qty 2

## 2023-01-23 NOTE — ED Triage Notes (Addendum)
Pt BIB [facility] form home d/t falling out of her w/c this AM at 0700 when reaching for something. Did not hit her head, no LOC, c/o 8/10 Lt arm pain from shoulder to wrist, does take Eliquis. A/Ox4, 110/50, 62 bpm, 100% on RA, CBG 200, no PIV upon arrival.

## 2023-01-23 NOTE — ED Notes (Signed)
Pt placed on 2L oxygen nasal cannula due to SpO2 85% RA

## 2023-01-23 NOTE — ED Notes (Signed)
Pt was sating at 81% on RA & 4L O2 via n/c  brought hr spO2 up to 98%

## 2023-01-23 NOTE — ED Notes (Signed)
Kendra MA at [facility] [phone] to be updated about patient status

## 2023-01-23 NOTE — ED Notes (Signed)
Awaiting patient from lobby 

## 2023-01-23 NOTE — ED Provider Triage Note (Signed)
Emergency Medicine Provider Triage Evaluation Note  Stephanie Woodward , a 82 y.o. female  was evaluated in triage.  Pt complains of fall at home.  She states her wheelchair malfunctioned, spun around and caused her to fall to the floor landing on her left side.  She did not strike her head or lose consciousness.  Patient takes Eliquis.  She is currently complaining of pain to her entire left arm.    Review of Systems  Positive: As above Negative: As above  Physical Exam  BP (!) 113/39 (BP Location: Right Arm)   Pulse 74   Temp 98.3 F (36.8 C)   Resp 18   Ht 5\' 2"  (1.575 m)   Wt 76.7 kg   SpO2 99%   BMI 30.91 kg/m  Gen:   Awake, no distress   Resp:  Normal effort  MSK:   Moves extremities without difficulty  Other:  No obvious deformities to the left arm; no tenderness to cervical spine, no obvious head injuries  Medical Decision Making  Medically screening exam initiated at 11:10 AM.  Appropriate orders placed.  Helaine Burfield was informed that the remainder of the evaluation will be completed by another provider, this initial triage assessment does not replace that evaluation, and the importance of remaining in the ED until their evaluation is complete.     Lenard Simmer, [place] 01/23/23 [phone]

## 2023-01-23 NOTE — ED Provider Notes (Signed)
[place] EMERGENCY DEPARTMENT AT [facility] Provider Note   CSN: [id number] Arrival date & time: 01/23/23  1028     History  Chief Complaint  Patient presents with   Fall   Lt arm pain     Leal Marx is a 82 y.o. female.  Patient is a 82 year old female who presents after mechanical fall.  She was in her wheelchair and thought it was locked but it was not.  She fell out of it landing on her left side.  She does not think she hit her head but her head was dislodged under the bed and she was not sure if she maybe bumped it when she fell.  She is on Eliquis.  She mostly complains of pain to her left arm from the shoulder all the way down to the hand.  She denies any other injuries.  No neck or back pain.       Home Medications Prior to Admission medications   Medication Sig Start Date End Date Taking? Authorizing Provider  acetaminophen (TYLENOL) 500 MG tablet Take 1,000 mg by mouth in the morning and at bedtime.    [provider]  amiodarone (PACERONE) 200 MG tablet Take 200 mg by mouth daily.    [provider]  atorvastatin (LIPITOR) 80 MG tablet Take 80 mg by mouth at bedtime.    [provider]  Cholecalciferol (VITAMIN D3) 25 MCG (1000 UT) tablet Take 1,000 Units by mouth daily.    [provider]  ELIQUIS 5 MG TABS tablet Take 1 tablet by mouth twice daily 07/23/20   Laurey Morale, MD  Insulin Glargine (SEMGLEE [place]) Inject 16 Units into the skin in the morning.    [provider]  Insulin Pen Needle 32G X 4 MM MISC 1 Device by Does not apply route in the morning, at noon, in the evening, and at bedtime. 02/06/20   Shamleffer, Konrad Dolores, MD  lidocaine [facility]) 3 % CREA cream Apply 1 application  topically every Monday, Wednesday, and Friday. Prior to dialysis 03/06/20   [provider]  Lidocaine 4 % PTCH Place 1 patch onto the skin daily as needed (for pain). 1/2 patch applied to shoulder or back  11/12/20   [provider]  midodrine (PROAMATINE) 10 MG tablet Take 10 mg by mouth 3 (three) times a week. Taken before dialysis on Monday, Wednesday, Friday Hold if SBP>130    [provider]  nitroGLYCERIN (NITROSTAT) 0.4 MG SL tablet Place 0.4 mg under the tongue every 5 (five) minutes as needed for chest pain.    [provider]  pantoprazole (PROTONIX) 40 MG tablet Take 1 tablet (40 mg total) by mouth 2 (two) times daily. 05/20/22   Tyrone Nine, MD  Pramoxine-Calamine (AVEENO ANTI-ITCH EX) Apply 1 application topically every 6 (six) hours as needed (itching). Patient not taking: Reported on 01/13/2023    [provider]      Allergies    Egg-derived products, Lisinopril, Penicillins, and Other    Review of Systems   Review of Systems  Constitutional:  Negative for fever.  Gastrointestinal:  Negative for nausea and vomiting.  Musculoskeletal:  Positive for arthralgias. Negative for back pain, joint swelling and neck pain.  Skin:  Negative for wound.  Neurological:  Negative for weakness, numbness and headaches.    Physical Exam Updated Vital Signs BP (!) 118/44 (BP Location: Right Arm)   Pulse (!) 56   Temp 98 F (36.7 C) (  Oral)   Resp 18   Ht 5\' 2"  (1.575 m)   Wt 76.7 kg   SpO2 98%   BMI 30.91 kg/m  Physical Exam Constitutional:      Appearance: She is well-developed.  HENT:     Head: Normocephalic and atraumatic.  Neck:     Comments: No pain along the spine Cardiovascular:     Rate and Rhythm: Normal rate.  Pulmonary:     Effort: Pulmonary effort is normal.  Musculoskeletal:        General: Tenderness present.     Cervical back: Normal range of motion and neck supple.     Comments: Positive tenderness to the left shoulder and in the left mid humerus area.  There is pain in the forearm and in the distal radius area.  There is some pain in the left thumb.  No pain to the elbow joint.  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  She has normal sensation and motor function distally.  Skin:    General: Skin is warm and dry.  Neurological:     Mental Status: She is alert and oriented to person, place, and time.     ED Results / Procedures / Treatments   Labs (all labs ordered are listed, but only abnormal results are displayed) Labs Reviewed - No data to display  EKG None  Radiology CT Cervical Spine Wo Contrast  Result Date: 01/23/2023 CLINICAL DATA:  Fall from wheelchair today. EXAM: CT CERVICAL SPINE WITHOUT CONTRAST TECHNIQUE: Multidetector CT imaging of the cervical spine was performed without intravenous contrast. Multiplanar CT image reconstructions were also generated. RADIATION DOSE REDUCTION: This exam was performed according to the departmental dose-optimization program which includes automated exposure control, adjustment of the mA and/or kV according to patient size and/or use of iterative reconstruction technique. COMPARISON:  CT of the cervical spine 04/20/2021 FINDINGS: Alignment: No significant listhesis is present. Mild straightening of the normal cervical lordosis is again seen. Skull base and vertebrae: Moderate pannus formation is present about the dens without significant erosion of the dens. The craniocervical junction is otherwise normal. Vertebral body heights are normal. No acute fractures are present. Soft tissues and spinal canal: No prevertebral fluid or swelling. No visible canal hematoma. Disc levels: Uncovertebral spurring results in moderate left foraminal stenosis at C3-4 and C5-6. Bilateral moderate foraminal stenosis is present at C6-7. Upper chest: The lung apices are clear. The thoracic inlet is within normal limits. IMPRESSION: 1. No acute fracture or traumatic subluxation. 2. 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 3. Bilateral moderate foraminal stenosis at C6-7. Electronically Signed   By: Marin Roberts M.D.   On: 01/23/2023 13:07   CT Head Wo  Contrast  Result Date: 01/23/2023 CLINICAL DATA:  Head trauma. Patient fell from wheelchair to. Patient denies hitting her head but does take Eliquis. EXAM: CT HEAD WITHOUT CONTRAST TECHNIQUE: Contiguous axial images were obtained from the base of the skull through the vertex without intravenous contrast. RADIATION DOSE REDUCTION: This exam was performed according to the departmental dose-optimization program which includes automated exposure control, adjustment of the mA and/or kV according to patient size and/or use of iterative reconstruction technique. COMPARISON:  CT head without contrast 07/13/2021. FINDINGS: Brain: No acute infarct, hemorrhage, or mass lesion is present. Moderate generalized atrophy and white matter disease is present bilaterally. Deep brain nuclei are within normal limits. The ventricles are proportionate to the degree of atrophy. No significant extraaxial fluid  collection is present. The brainstem and cerebellum are within normal limits. Midline structures are within normal limits. Vascular: Atherosclerotic calcifications within the cavernous internal carotid arteries bilaterally are stable. No hyperdense vessel is present. Skull: Calvarium is intact. No focal lytic or blastic lesions are present. No significant extracranial soft tissue lesion is present. Sinuses/Orbits: The paranasal sinuses and mastoid air cells are clear. Bilateral lens replacements are noted. Globes and orbits are otherwise unremarkable. IMPRESSION: 1. No acute intracranial abnormality or significant interval change. 2. Moderate generalized atrophy and white matter disease likely reflects the sequela of chronic microvascular ischemia. Electronically Signed   By: Marin Roberts M.D.   On: 01/23/2023 13:04   DG Chest 2 View  Result Date: 01/23/2023 CLINICAL DATA:  Cough.  COPD.  Congestive heart failure. EXAM: CHEST - 2 VIEW COMPARISON:  11/02/2022 FINDINGS: Stable mild cardiomegaly. Diffuse pulmonary vascular  congestion is unchanged. No evidence of acute infiltrate or edema. No pleural effusion. IMPRESSION: Stable cardiomegaly and pulmonary vascular congestion. No acute findings. Electronically Signed   By: Danae Orleans M.D.   On: 01/23/2023 12:59   DG Shoulder Left  Result Date: 01/23/2023 CLINICAL DATA:  Fall out of bed.  Left shoulder injury and pain. EXAM: LEFT SHOULDER - 2+ VIEW COMPARISON:  None Available. FINDINGS: There is no evidence of fracture or dislocation. 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 High-riding humeral head also noted, consistent with chronic rotator cuff tear or atrophy. Soft tissues are unremarkable. IMPRESSION: No acute findings. Glenohumeral osteoarthritis. High-riding humeral head, consistent with chronic rotator cuff tear or atrophy. Electronically Signed   By: Danae Orleans M.D.   On: 01/23/2023 12:57   DG Humerus Left  Result Date: 01/23/2023 CLINICAL DATA:  Fall out of bed.  Left upper arm pain. EXAM: LEFT HUMERUS - 2+ VIEW COMPARISON:  None Available. FINDINGS: There is no evidence of fracture or other focal bone lesions. Soft tissues are unremarkable. IMPRESSION: Negative. Electronically Signed   By: Danae Orleans M.D.   On: 01/23/2023 12:56   DG Hand Complete Left  Result Date: 01/23/2023 CLINICAL DATA:  Fall out of bed.  Left hand pain. EXAM: LEFT HAND - COMPLETE 3+ VIEW COMPARISON:  None Available. FINDINGS: There is no evidence of acute fracture or dislocation. Generalized osteopenia. Mild osteoarthritis involving the DIP joints and wrist. Widening of the scapholunate joint with chronic remodeling noted, consistent with chronic scapholunate ligament injury. IMPRESSION: No acute findings. 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 Electronically Signed   By: Danae Orleans M.D.   On: 01/23/2023 12:55   DG Forearm Left  Result Date: 01/23/2023 CLINICAL DATA:  Fall out of bed.  Left forearm pain. EXAM: LEFT FOREARM - 2 VIEW COMPARISON:   None Available. FINDINGS: There is no evidence of fracture. Degenerative spurring is seen involving the elbow with prominent olecranon process bone spur. Generalized osteopenia. Peripheral vascular calcification. IMPRESSION: No acute findings. Degenerative spurring involving the elbow. Osteopenia. Electronically Signed   By: Danae Orleans M.D.   On: 01/23/2023 12:52    Procedures Procedures    Medications Ordered in ED Medications  acetaminophen (TYLENOL) tablet 1,000 mg (1,000 mg Oral Given 01/23/23 1204)    ED Course/ Medical Decision Making/ A&P                                 Medical Decision Making Amount and/or Complexity of Data Reviewed Radiology: ordered.   Patient is  a 82 year old female who presents after mechanical fall.  She has pain in her left arm.  There is a questionable head injury.  CT scan of her head and cervical spine were performed which show no acute traumatic injuries.  No intracranial hemorrhage.  X-rays of the left arm including the shoulder, humerus, forearm and hand were interpreted by me confirmed by the radiologist to show no acute abnormality.  Chest x-ray showed some mild pulmonary edema which appears to be chronic.  She was not complaining of any shortness of breath or other respiratory symptoms.  She was maintaining normal oxygen saturations when she was awake.  When she was asleep, she would sometimes go down into the 80s.  Seems to be may be the position that she was sitting in.  She is also due for dialysis tomorrow.  She was discharged home in good condition.  She was given a shoulder sling to use for comfort.  She will take Tylenol for pain.  Encouraged her to follow-up with her primary care doctor if her symptoms are improving.  Return precautions were given.  Final Clinical Impression(s) / ED Diagnoses Final diagnoses:  Pain of left upper extremity    Rx / DC Orders ED Discharge Orders     None         Rolan Bucco, MD 01/23/23 1456

## 2023-02-03 ENCOUNTER — Other Ambulatory Visit: Payer: Self-pay | Admitting: Internal Medicine

## 2023-02-03 ENCOUNTER — Ambulatory Visit
Admission: RE | Admit: 2023-02-03 | Discharge: 2023-02-03 | Disposition: A | Discharge status: Home / Self Care | Payer: Medicare (Managed Care) | Source: Ambulatory Visit | Attending: Internal Medicine | Admitting: Internal Medicine

## 2023-02-03 DIAGNOSIS — M25561 Pain in right knee: Secondary | ICD-10-CM

## 2023-02-03 DIAGNOSIS — M25562 Pain in left knee: Secondary | ICD-10-CM

## 2023-03-02 ENCOUNTER — Inpatient Hospital Stay (HOSPITAL_COMMUNITY): Payer: Medicare (Managed Care) | Admitting: Anesthesiology

## 2023-03-02 ENCOUNTER — Inpatient Hospital Stay (HOSPITAL_COMMUNITY): Payer: Medicare (Managed Care)

## 2023-03-02 ENCOUNTER — Inpatient Hospital Stay (HOSPITAL_COMMUNITY)
Admission: EM | Admit: 2023-03-02 | Discharge: 2023-03-26 | DRG: 521 | Disposition: E | Payer: Medicare (Managed Care) | Attending: Pulmonary Disease | Admitting: Pulmonary Disease

## 2023-03-02 ENCOUNTER — Emergency Department (HOSPITAL_COMMUNITY): Payer: Medicare (Managed Care)

## 2023-03-02 ENCOUNTER — Other Ambulatory Visit: Payer: Self-pay

## 2023-03-02 DIAGNOSIS — Z87891 Personal history of nicotine dependence: Secondary | ICD-10-CM

## 2023-03-02 DIAGNOSIS — K219 Gastro-esophageal reflux disease without esophagitis: Secondary | ICD-10-CM | POA: Diagnosis present

## 2023-03-02 DIAGNOSIS — E1121 Type 2 diabetes mellitus with diabetic nephropathy: Secondary | ICD-10-CM | POA: Diagnosis not present

## 2023-03-02 DIAGNOSIS — I132 Hypertensive heart and chronic kidney disease with heart failure and with stage 5 chronic kidney disease, or end stage renal disease: Secondary | ICD-10-CM | POA: Diagnosis present

## 2023-03-02 DIAGNOSIS — Z515 Encounter for palliative care: Secondary | ICD-10-CM | POA: Diagnosis not present

## 2023-03-02 DIAGNOSIS — E1122 Type 2 diabetes mellitus with diabetic chronic kidney disease: Secondary | ICD-10-CM | POA: Diagnosis present

## 2023-03-02 DIAGNOSIS — I2510 Atherosclerotic heart disease of native coronary artery without angina pectoris: Secondary | ICD-10-CM | POA: Diagnosis not present

## 2023-03-02 DIAGNOSIS — J449 Chronic obstructive pulmonary disease, unspecified: Secondary | ICD-10-CM | POA: Diagnosis not present

## 2023-03-02 DIAGNOSIS — F03918 Unspecified dementia, unspecified severity, with other behavioral disturbance: Secondary | ICD-10-CM | POA: Diagnosis present

## 2023-03-02 DIAGNOSIS — M109 Gout, unspecified: Secondary | ICD-10-CM | POA: Diagnosis present

## 2023-03-02 DIAGNOSIS — I871 Compression of vein: Secondary | ICD-10-CM | POA: Diagnosis present

## 2023-03-02 DIAGNOSIS — L89322 Pressure ulcer of left buttock, stage 2: Secondary | ICD-10-CM | POA: Diagnosis not present

## 2023-03-02 DIAGNOSIS — E1129 Type 2 diabetes mellitus with other diabetic kidney complication: Secondary | ICD-10-CM | POA: Diagnosis present

## 2023-03-02 DIAGNOSIS — W06XXXA Fall from bed, initial encounter: Secondary | ICD-10-CM | POA: Diagnosis present

## 2023-03-02 DIAGNOSIS — G9341 Metabolic encephalopathy: Secondary | ICD-10-CM | POA: Diagnosis not present

## 2023-03-02 DIAGNOSIS — I9589 Other hypotension: Secondary | ICD-10-CM | POA: Diagnosis present

## 2023-03-02 DIAGNOSIS — I959 Hypotension, unspecified: Secondary | ICD-10-CM | POA: Diagnosis not present

## 2023-03-02 DIAGNOSIS — F0394 Unspecified dementia, unspecified severity, with anxiety: Secondary | ICD-10-CM | POA: Diagnosis present

## 2023-03-02 DIAGNOSIS — Z794 Long term (current) use of insulin: Secondary | ICD-10-CM | POA: Diagnosis not present

## 2023-03-02 DIAGNOSIS — N186 End stage renal disease: Secondary | ICD-10-CM | POA: Diagnosis present

## 2023-03-02 DIAGNOSIS — Z6830 Body mass index (BMI) 30.0-30.9, adult: Secondary | ICD-10-CM

## 2023-03-02 DIAGNOSIS — Z66 Do not resuscitate: Secondary | ICD-10-CM | POA: Diagnosis not present

## 2023-03-02 DIAGNOSIS — D631 Anemia in chronic kidney disease: Secondary | ICD-10-CM | POA: Diagnosis present

## 2023-03-02 DIAGNOSIS — E669 Obesity, unspecified: Secondary | ICD-10-CM | POA: Diagnosis present

## 2023-03-02 DIAGNOSIS — Z9049 Acquired absence of other specified parts of digestive tract: Secondary | ICD-10-CM

## 2023-03-02 DIAGNOSIS — G4700 Insomnia, unspecified: Secondary | ICD-10-CM | POA: Diagnosis present

## 2023-03-02 DIAGNOSIS — I480 Paroxysmal atrial fibrillation: Secondary | ICD-10-CM | POA: Diagnosis present

## 2023-03-02 DIAGNOSIS — M160 Bilateral primary osteoarthritis of hip: Secondary | ICD-10-CM | POA: Diagnosis present

## 2023-03-02 DIAGNOSIS — M1711 Unilateral primary osteoarthritis, right knee: Secondary | ICD-10-CM | POA: Diagnosis present

## 2023-03-02 DIAGNOSIS — F0390 Unspecified dementia without behavioral disturbance: Secondary | ICD-10-CM | POA: Diagnosis present

## 2023-03-02 DIAGNOSIS — M80051A Age-related osteoporosis with current pathological fracture, right femur, initial encounter for fracture: Principal | ICD-10-CM | POA: Diagnosis present

## 2023-03-02 DIAGNOSIS — Z7409 Other reduced mobility: Secondary | ICD-10-CM | POA: Diagnosis present

## 2023-03-02 DIAGNOSIS — G4733 Obstructive sleep apnea (adult) (pediatric): Secondary | ICD-10-CM | POA: Diagnosis present

## 2023-03-02 DIAGNOSIS — Y832 Surgical operation with anastomosis, bypass or graft as the cause of abnormal reaction of the patient, or of later complication, without mention of misadventure at the time of the procedure: Secondary | ICD-10-CM | POA: Diagnosis present

## 2023-03-02 DIAGNOSIS — K5900 Constipation, unspecified: Secondary | ICD-10-CM | POA: Diagnosis not present

## 2023-03-02 DIAGNOSIS — I5042 Chronic combined systolic (congestive) and diastolic (congestive) heart failure: Secondary | ICD-10-CM | POA: Diagnosis present

## 2023-03-02 DIAGNOSIS — D62 Acute posthemorrhagic anemia: Secondary | ICD-10-CM | POA: Diagnosis not present

## 2023-03-02 DIAGNOSIS — T82858A Stenosis of vascular prosthetic devices, implants and grafts, initial encounter: Secondary | ICD-10-CM | POA: Diagnosis present

## 2023-03-02 DIAGNOSIS — Z992 Dependence on renal dialysis: Secondary | ICD-10-CM | POA: Diagnosis not present

## 2023-03-02 DIAGNOSIS — D72829 Elevated white blood cell count, unspecified: Secondary | ICD-10-CM | POA: Diagnosis present

## 2023-03-02 DIAGNOSIS — E785 Hyperlipidemia, unspecified: Secondary | ICD-10-CM | POA: Diagnosis present

## 2023-03-02 DIAGNOSIS — S72001A Fracture of unspecified part of neck of right femur, initial encounter for closed fracture: Secondary | ICD-10-CM | POA: Diagnosis present

## 2023-03-02 DIAGNOSIS — Z888 Allergy status to other drugs, medicaments and biological substances status: Secondary | ICD-10-CM

## 2023-03-02 DIAGNOSIS — Z91012 Allergy to eggs: Secondary | ICD-10-CM

## 2023-03-02 DIAGNOSIS — N2581 Secondary hyperparathyroidism of renal origin: Secondary | ICD-10-CM | POA: Diagnosis present

## 2023-03-02 DIAGNOSIS — Z79899 Other long term (current) drug therapy: Secondary | ICD-10-CM

## 2023-03-02 DIAGNOSIS — Z880 Allergy status to penicillin: Secondary | ICD-10-CM

## 2023-03-02 DIAGNOSIS — J4489 Other specified chronic obstructive pulmonary disease: Secondary | ICD-10-CM | POA: Diagnosis present

## 2023-03-02 DIAGNOSIS — Z993 Dependence on wheelchair: Secondary | ICD-10-CM

## 2023-03-02 DIAGNOSIS — M898X9 Other specified disorders of bone, unspecified site: Secondary | ICD-10-CM | POA: Diagnosis present

## 2023-03-02 DIAGNOSIS — E119 Type 2 diabetes mellitus without complications: Secondary | ICD-10-CM | POA: Diagnosis not present

## 2023-03-02 DIAGNOSIS — Z7189 Other specified counseling: Secondary | ICD-10-CM | POA: Diagnosis not present

## 2023-03-02 DIAGNOSIS — Z7901 Long term (current) use of anticoagulants: Secondary | ICD-10-CM

## 2023-03-02 DIAGNOSIS — Z90710 Acquired absence of both cervix and uterus: Secondary | ICD-10-CM

## 2023-03-02 DIAGNOSIS — M8588 Other specified disorders of bone density and structure, other site: Secondary | ICD-10-CM | POA: Diagnosis present

## 2023-03-02 LAB — CBC WITH DIFFERENTIAL/PLATELET
Abs Immature Granulocytes: 0.09 10*3/uL — ABNORMAL HIGH (ref 0.00–0.07)
Basophils Absolute: 0.1 10*3/uL (ref 0.0–0.1)
Basophils Relative: 1 %
Eosinophils Absolute: 0.1 10*3/uL (ref 0.0–0.5)
Eosinophils Relative: 1 %
HCT: 38.5 % (ref 36.0–46.0)
Hemoglobin: 12.3 g/dL (ref 12.0–15.0)
Immature Granulocytes: 1 %
Lymphocytes Relative: 9 %
Lymphs Abs: 1.1 10*3/uL (ref 0.7–4.0)
MCH: 31.9 pg (ref 26.0–34.0)
MCHC: 31.9 g/dL (ref 30.0–36.0)
MCV: 100 fL (ref 80.0–100.0)
Monocytes Absolute: 0.8 10*3/uL (ref 0.1–1.0)
Monocytes Relative: 6 %
Neutro Abs: 10.1 10*3/uL — ABNORMAL HIGH (ref 1.7–7.7)
Neutrophils Relative %: 82 %
Platelets: 197 10*3/uL (ref 150–400)
RBC: 3.85 MIL/uL — ABNORMAL LOW (ref 3.87–5.11)
RDW: 14.7 % (ref 11.5–15.5)
WBC: 12.2 10*3/uL — ABNORMAL HIGH (ref 4.0–10.5)
nRBC: 0 % (ref 0.0–0.2)

## 2023-03-02 LAB — GLUCOSE, CAPILLARY
Glucose-Capillary: 115 mg/dL — ABNORMAL HIGH (ref 70–99)
Glucose-Capillary: 102 mg/dL — ABNORMAL HIGH (ref 70–99)
Glucose-Capillary: 121 mg/dL — ABNORMAL HIGH (ref 70–99)
Glucose-Capillary: 139 mg/dL — ABNORMAL HIGH (ref 70–99)

## 2023-03-02 LAB — TYPE AND SCREEN
ABO/RH(D): A POS
Antibody Screen: NEGATIVE

## 2023-03-02 LAB — BASIC METABOLIC PANEL
Anion gap: 16 — ABNORMAL HIGH (ref 5–15)
BUN: 29 mg/dL — ABNORMAL HIGH (ref 8–23)
CO2: 26 mmol/L (ref 22–32)
Calcium: 8.9 mg/dL (ref 8.9–10.3)
Chloride: 96 mmol/L — ABNORMAL LOW (ref 98–111)
Creatinine, Ser: 6.94 mg/dL — ABNORMAL HIGH (ref 0.44–1.00)
GFR, Estimated: 6 mL/min — ABNORMAL LOW (ref >60)
Glucose, Bld: 71 mg/dL (ref 70–99)
Potassium: 4 mmol/L (ref 3.5–5.1)
Sodium: 138 mmol/L (ref 135–145)

## 2023-03-02 LAB — HEPATITIS B SURFACE ANTIGEN: Hepatitis B Surface Ag: NONREACTIVE

## 2023-03-02 LAB — ABO/RH: ABO/RH(D): A POS

## 2023-03-02 LAB — CBG MONITORING, ED: Glucose-Capillary: 84 mg/dL (ref 70–99)

## 2023-03-02 LAB — PHOSPHORUS: Phosphorus: 4.9 mg/dL — ABNORMAL HIGH (ref 2.5–4.6)

## 2023-03-02 LAB — ALBUMIN: Albumin: 3.2 g/dL — ABNORMAL LOW (ref 3.5–5.0)

## 2023-03-02 MED ORDER — ATORVASTATIN CALCIUM 80 MG PO TABS
80.0000 mg | ORAL_TABLET | Freq: Every day | ORAL | Status: DC
  Administered 2023-03-02 – 2023-03-08 (×7): 80 mg via ORAL
  Filled 2023-03-02 (×4): qty 1
  Filled 2023-03-02: qty 2
  Filled 2023-03-02 (×2): qty 1

## 2023-03-02 MED ORDER — INSULIN ASPART 100 UNIT/ML IJ SOLN: 0.0000 [IU] | INTRAMUSCULAR | Status: DC

## 2023-03-02 MED ORDER — MORPHINE SULFATE (PF) 2 MG/ML IV SOLN
2.0000 mg | Freq: Once | INTRAVENOUS | Status: AC
Start: 2023-03-02 — End: 2023-03-02
  Administered 2023-03-02: 2 mg via INTRAVENOUS
  Filled 2023-03-02: qty 1

## 2023-03-02 MED ORDER — HYDROMORPHONE HCL 1 MG/ML IJ SOLN
0.5000 mg | INTRAMUSCULAR | Status: DC | PRN
Start: 2023-03-02 — End: 2023-03-02

## 2023-03-02 MED ORDER — MORPHINE SULFATE (PF) 2 MG/ML IV SOLN
2.0000 mg | Freq: Once | INTRAVENOUS | Status: DC
  Filled 2023-03-02: qty 1

## 2023-03-02 MED ORDER — ACETAMINOPHEN 500 MG PO TABS
1000.0000 mg | ORAL_TABLET | Freq: Once | ORAL | Status: DC
  Filled 2023-03-02: qty 2

## 2023-03-02 MED ORDER — INSULIN ASPART 100 UNIT/ML IJ SOLN
0.0000 [IU] | INTRAMUSCULAR | Status: DC
  Administered 2023-03-03 (×2): 2 [IU] via SUBCUTANEOUS
  Administered 2023-03-03 – 2023-03-04 (×2): 1 [IU] via SUBCUTANEOUS
  Administered 2023-03-04: 2 [IU] via SUBCUTANEOUS

## 2023-03-02 MED ORDER — DIPHENHYDRAMINE HCL 50 MG/ML IJ SOLN
12.5000 mg | Freq: Once | INTRAMUSCULAR | Status: DC
  Filled 2023-03-02: qty 1

## 2023-03-02 MED ORDER — AMIODARONE HCL 200 MG PO TABS
200.0000 mg | ORAL_TABLET | Freq: Every day | ORAL | Status: DC
  Administered 2023-03-02 – 2023-03-11 (×10): 200 mg via ORAL
  Filled 2023-03-02 (×11): qty 1

## 2023-03-02 MED ORDER — HYDROMORPHONE HCL 1 MG/ML IJ SOLN
0.2500 mg | INTRAMUSCULAR | Status: DC | PRN
  Administered 2023-03-02 (×2): 0.5 mg via INTRAVENOUS
  Filled 2023-03-02 (×2): qty 0.5

## 2023-03-02 MED ORDER — ONDANSETRON HCL 4 MG/2ML IJ SOLN
4.0000 mg | Freq: Once | INTRAMUSCULAR | Status: AC
  Administered 2023-03-02: 4 mg via INTRAVENOUS
  Filled 2023-03-02: qty 2

## 2023-03-02 MED ORDER — BUPIVACAINE-EPINEPHRINE (PF) 0.5% -1:200000 IJ SOLN
INTRAMUSCULAR | Status: DC | PRN
  Administered 2023-03-02: 25 mL via PERINEURAL

## 2023-03-02 MED ORDER — MIDODRINE HCL 5 MG PO TABS
10.0000 mg | ORAL_TABLET | Freq: Every day | ORAL | Status: DC | PRN
  Administered 2023-03-02 – 2023-03-04 (×2): 10 mg via ORAL
  Filled 2023-03-02 (×2): qty 2

## 2023-03-02 MED ORDER — RENA-VITE PO TABS
1.0000 | ORAL_TABLET | Freq: Every day | ORAL | Status: DC
  Administered 2023-03-02 – 2023-03-11 (×10): 1 via ORAL
  Filled 2023-03-02 (×10): qty 1

## 2023-03-02 MED ORDER — CHLORHEXIDINE GLUCONATE CLOTH 2 % EX PADS
6.0000 | MEDICATED_PAD | Freq: Every day | CUTANEOUS | Status: DC
  Administered 2023-03-03 – 2023-03-11 (×9): 6 via TOPICAL

## 2023-03-02 MED ORDER — PANTOPRAZOLE SODIUM 40 MG PO TBEC
40.0000 mg | DELAYED_RELEASE_TABLET | Freq: Two times a day (BID) | ORAL | Status: DC
  Administered 2023-03-02 – 2023-03-11 (×20): 40 mg via ORAL
  Filled 2023-03-02 (×21): qty 1

## 2023-03-02 MED ORDER — HYDROCODONE-ACETAMINOPHEN 5-325 MG PO TABS
1.0000 | ORAL_TABLET | Freq: Four times a day (QID) | ORAL | Status: DC | PRN
  Administered 2023-03-02: 2 via ORAL
  Administered 2023-03-02: 1 via ORAL
  Filled 2023-03-02: qty 1
  Filled 2023-03-02: qty 2
  Filled 2023-03-02: qty 1

## 2023-03-02 MED ORDER — DIPHENHYDRAMINE HCL 50 MG/ML IJ SOLN
25.0000 mg | Freq: Once | INTRAMUSCULAR | Status: AC
  Administered 2023-03-02: 25 mg via INTRAVENOUS

## 2023-03-02 NOTE — Assessment & Plan Note (Signed)
 Hold eliquis Cont amiodarone

## 2023-03-02 NOTE — Assessment & Plan Note (Signed)
 Continue midodrine before dialysis on MWF.

## 2023-03-02 NOTE — Assessment & Plan Note (Signed)
 Hip fx pathway NPO Hold eliquis Pain control with PRN norco or PRN IV dilaudid - watch for oversedation EDP d/w Dr. Aundria Rud, ortho will see in AM

## 2023-03-02 NOTE — Consult Note (Signed)
 Reason for Consult:Right hip fx Referring Physician: Chapman Dahal Time called: 0805 Time at bedside: 0940   Stephanie Woodward is an 83 y.o. female.  HPI: Stephanie Woodward slid out of bed at home and could not get up. She didn't really have pain at first but when EMS came and got her up she began to have severe pain in her knee and hip. She was brought to the ED where workup showed a right hip fx and orthopedic surgery was consulted. She lives at home alone and gets around with a WC. She only stands to transfer.  Past Medical History:  Diagnosis Date   Arthritis    Asthma    Atrial fibrillation (HCC)    CHF (congestive heart failure) (HCC)    CKD (chronic kidney disease), stage III (HCC)    COPD (chronic obstructive pulmonary disease) (HCC)    Diabetes mellitus    INSULIN  DEPENDENT   Dialysis patient (HCC)    Gout    Heart murmur    no issues per pt   History of kidney stones    Hyperlipemia    Hypertension    Psoriasis    Renal disorder    congenital   Single kidney    Sleep apnea    doesn't use the Cpap    Past Surgical History:  Procedure Laterality Date   A/V FISTULAGRAM Left 10/31/2020   Procedure: A/V FISTULAGRAM;  Surgeon: Eliza Lonni RAMAN, MD;  Location: [facility] INVASIVE CV LAB;  Service: Cardiovascular;  Laterality: Left;   ABDOMINAL HYSTERECTOMY     AV FISTULA PLACEMENT Left 11/17/2018   Procedure: BRACHIO-CEPHALIC ARTERIOVENOUS (AV) FISTULA CREATION IN LEFT ARM;  Surgeon: Gretta Lonni PARAS, MD;  Location: [facility] OR;  Service: Vascular;  Laterality: Left;   CARDIOVERSION N/A 03/03/2021   Procedure: CARDIOVERSION;  Surgeon: Rolan Ezra RAMAN, MD;  Location: [facility] ENDOSCOPY;  Service: Cardiovascular;  Laterality: N/A;   CARDIOVERSION N/A 03/12/2021   Procedure: CARDIOVERSION;  Surgeon: Rolan Ezra RAMAN, MD;  Location: [facility] ENDOSCOPY;  Service: Cardiovascular;  Laterality: N/A;   CHOLECYSTECTOMY     INSERTION OF DIALYSIS CATHETER Right 01/26/2019   Procedure: INSERTION OF DIALYSIS  CATHETER, right internal jugular;  Surgeon: Eliza Lonni RAMAN, MD;  Location: [facility] OR;  Service: Vascular;  Laterality: Right;   LEFT AND RIGHT HEART CATHETERIZATION WITH CORONARY ANGIOGRAM N/A 11/29/2013   Procedure: LEFT AND RIGHT HEART CATHETERIZATION WITH CORONARY ANGIOGRAM;  Surgeon: Elsie RAMAN Somerset, MD;  Location: [facility] CATH LAB;  Service: Cardiovascular;  Laterality: N/A;   LEFT HEART CATH AND CORONARY ANGIOGRAPHY N/A 02/24/2021   Procedure: LEFT HEART CATH AND CORONARY ANGIOGRAPHY;  Surgeon: Rolan Ezra RAMAN, MD;  Location: [facility] INVASIVE CV LAB;  Service: Cardiovascular;  Laterality: N/A;   PERIPHERAL VASCULAR INTERVENTION Left 10/31/2020   Procedure: PERIPHERAL VASCULAR INTERVENTION;  Surgeon: Eliza Lonni RAMAN, MD;  Location: [facility] INVASIVE CV LAB;  Service: Cardiovascular;  Laterality: Left;   RIGHT HEART CATH N/A 11/23/2017   Procedure: RIGHT HEART CATH;  Surgeon: Rolan Ezra RAMAN, MD;  Location: [facility] INVASIVE CV LAB;  Service: Cardiovascular;  Laterality: N/A;   RIGHT/LEFT HEART CATH AND CORONARY ANGIOGRAPHY N/A 06/15/2019   Procedure: RIGHT/LEFT HEART CATH AND CORONARY ANGIOGRAPHY;  Surgeon: Rolan Ezra RAMAN, MD;  Location: [facility] INVASIVE CV LAB;  Service: Cardiovascular;  Laterality: N/A;    Family History  Problem Relation Age of Onset   Lupus Daughter    Cancer Mother 23       type unknown   CVA Father 70  Cancer Brother 69       type unknown    Social History:  reports that she quit smoking about 14 years ago. Her smoking use included cigarettes. She started smoking about 34 years ago. She has a 20 pack-year smoking history. She has never used smokeless tobacco. She reports current alcohol use. She reports that she does not currently use drugs.  Allergies:  Allergies  Allergen Reactions   Egg-Derived Products Nausea And Vomiting   Lisinopril Nausea And Vomiting   Penicillins Nausea And Vomiting    Has patient had a PCN reaction causing immediate rash, facial/tongue/throat  swelling, SOB or lightheadedness with hypotension: No Has patient had a PCN reaction causing severe rash involving mucus membranes or skin necrosis: No Has patient had a PCN reaction that required hospitalization: No Has patient had a PCN reaction occurring within the last 10 years: No If all of the above answers are NO, then may proceed with Cephalosporin use.    Tramadol      Hallucinations, mental status changes    Medications: I have reviewed the patient's current medications.  Results for orders placed or performed during the hospital encounter of 03/02/23 (from the past 48 hours)  Basic metabolic panel     Status: Abnormal   Collection Time: 03/02/23  2:34 AM  Result Value Ref Range   Sodium 138 135 - 145 mmol/L   Potassium 4.0 3.5 - 5.1 mmol/L   Chloride 96 (L) 98 - 111 mmol/L   CO2 26 22 - 32 mmol/L   Glucose, Bld 71 70 - 99 mg/dL    Comment: Glucose reference range applies only to samples taken after fasting for at least 8 hours.   BUN 29 (H) 8 - 23 mg/dL   Creatinine, Ser 3.05 (H) 0.44 - 1.00 mg/dL   Calcium  8.9 8.9 - 10.3 mg/dL   GFR, Estimated 6 (L) >60 mL/min    Comment: (NOTE) Calculated using the CKD-EPI Creatinine Equation (2021)    Anion gap 16 (H) 5 - 15    Comment: Performed at [facility], [address], [place], [place] [address]  CBC with Differential     Status: Abnormal   Collection Time: 03/02/23  2:34 AM  Result Value Ref Range   WBC 12.2 (H) 4.0 - 10.5 K/uL   RBC 3.85 (L) 3.87 - 5.11 MIL/uL   Hemoglobin 12.3 12.0 - 15.0 g/dL   HCT 61.4 63.9 - 53.9 %   MCV 100.0 80.0 - 100.0 fL   MCH 31.9 26.0 - 34.0 pg   MCHC 31.9 30.0 - 36.0 g/dL   RDW 85.2 88.4 - 84.4 %   Platelets 197 150 - 400 K/uL   nRBC 0.0 0.0 - 0.2 %   Neutrophils Relative % 82 %   Neutro Abs 10.1 (H) 1.7 - 7.7 K/uL   Lymphocytes Relative 9 %   Lymphs Abs 1.1 0.7 - 4.0 K/uL   Monocytes Relative 6 %   Monocytes Absolute 0.8 0.1 - 1.0 K/uL   Eosinophils Relative 1 %    Eosinophils Absolute 0.1 0.0 - 0.5 K/uL   Basophils Relative 1 %   Basophils Absolute 0.1 0.0 - 0.1 K/uL   Immature Granulocytes 1 %   Abs Immature Granulocytes 0.09 (H) 0.00 - 0.07 K/uL    Comment: Performed at [facility], [address] [place], [place] [address]  ABO/Rh     Status: None   Collection Time: 03/02/23  2:34 AM  Result Value  Ref Range   ABO/RH(D)      A POS Performed at [facility], [address], [place], [place] [address]   CBG monitoring, ED     Status: None   Collection Time: 03/02/23  4:21 AM  Result Value Ref Range   Glucose-Capillary 84 70 - 99 mg/dL    Comment: Glucose reference range applies only to samples taken after fasting for at least 8 hours.  Glucose, capillary     Status: Abnormal   Collection Time: 03/02/23  5:26 AM  Result Value Ref Range   Glucose-Capillary 115 (H) 70 - 99 mg/dL    Comment: Glucose reference range applies only to samples taken after fasting for at least 8 hours.  Type and screen [facility]     Status: None   Collection Time: 03/02/23  6:30 AM  Result Value Ref Range   ABO/RH(D) A POS    Antibody Screen NEG    Sample Expiration      03/05/2023,2359 Performed at [facility] Lab, [address] [place], [place] [address]     Chest Portable 1 View Result Date: 03/02/2023 CLINICAL DATA:  Preoperative respiratory evaluation. EXAM: PORTABLE CHEST 1 VIEW COMPARISON:  01/23/2023 FINDINGS: The cardio pericardial silhouette is enlarged. There is pulmonary vascular congestion without overt pulmonary edema. No acute bony abnormality. Telemetry leads overlie the chest. IMPRESSION: Enlargement of the cardiopericardial silhouette with pulmonary vascular congestion. Electronically Signed   By: Camellia Candle M.D.   On: 03/02/2023 05:06   DG Knee Right Port Result Date: 03/02/2023 CLINICAL DATA:  Right hip pain, leg pain EXAM: PORTABLE RIGHT KNEE - 1-2 VIEW COMPARISON:  None Available. FINDINGS: Advanced  tricompartment osteoarthritis within the right knee. No joint effusion. No acute bony abnormality. Specifically, no fracture, subluxation, or dislocation. Vascular calcifications present. Soft tissues are intact. IMPRESSION: Advanced tricompartment degenerative changes. No acute bony abnormality. Electronically Signed   By: Franky Crease M.D.   On: 03/02/2023 02:30   DG Hip Unilat W or Wo Pelvis 2-3 Views Right Result Date: 03/02/2023 CLINICAL DATA:  Right hip pain EXAM: DG HIP (WITH OR WITHOUT PELVIS) 2-3V RIGHT COMPARISON:  None Available. FINDINGS: There is a right femoral neck fracture. Mild varus angulation. Advanced degenerative changes in the hips bilaterally with joint space narrowing and spurring. Diffuse osteopenia. Diffuse vascular calcifications. IMPRESSION: Right femoral neck fracture with varus angulation. 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 Osteopenia. Electronically Signed   By: Franky Crease M.D.   On: 03/02/2023 02:14    Review of Systems  HENT:  Negative for ear discharge, ear pain, hearing loss and tinnitus.   Eyes:  Negative for photophobia and pain.  Respiratory:  Negative for cough and shortness of breath.   Cardiovascular:  Negative for chest pain.  Gastrointestinal:  Negative for abdominal pain, nausea and vomiting.  Genitourinary:  Negative for dysuria, flank pain, frequency and urgency.  Musculoskeletal:  Positive for arthralgias (Right hip/knee). Negative for back pain, myalgias and neck pain.  Neurological:  Negative for dizziness and headaches.  Hematological:  Does not bruise/bleed easily.  Psychiatric/Behavioral:  The patient is not nervous/anxious.    Blood pressure (!) 106/43, pulse (!) 51, temperature 97.7 F (36.5 C), temperature source Oral, resp. rate (!) 22, height 5' 2 (1.575 m), weight 76 kg, SpO2 99%. Physical Exam Constitutional:      General: She is not in acute distress.    Appearance: She is well-developed. She is not diaphoretic.  HENT:     Head: Normocephalic and atraumatic.  Eyes:     General: No scleral icterus.       Right eye: No discharge.        Left eye: No discharge.     Conjunctiva/sclera: Conjunctivae normal.  Cardiovascular:     Rate and Rhythm: Normal rate and regular rhythm.  Pulmonary:     Effort: Pulmonary effort is normal. No respiratory distress.  Musculoskeletal:     Cervical back: Normal range of motion.     Comments: RLE No traumatic wounds, ecchymosis, or rash  Mod TTP hip  No knee or ankle effusion  Knee stable to varus/ valgus and anterior/posterior stress  Sens DPN, SPN, TN intact  Motor EHL, ext, flex, evers 5/5  DP 2+, PT 2+, No significant edema  Skin:    General: Skin is warm and dry.  Neurological:     Mental Status: She is alert.  Psychiatric:        Mood and Affect: Mood normal.        Behavior: Behavior normal.     Assessment/Plan: Right hip fx -- Plan hip hemi tomorrow with Dr. Celena. Please keep NPO after MN. Multiple medical problems including CKD 3, HFpEF (nl EF, grade 3 DD 04/2022) , A.Fib on eliquis  (last dose 1/7 pm), ESRD on MWF HD, DM2, and dementia without behavioral disturbance -- per primary service. Please hold Eliquis  until after surgery.    Stephanie DOROTHA Ned, PA-C Orthopedic Surgery [phone] 03/02/2023, 10:01 AM

## 2023-03-02 NOTE — ED Provider Notes (Signed)
 [facility] EMERGENCY DEPARTMENT AT [facility] Provider Note   CSN: [id number] Arrival date & time: 03/02/23  0125     History  Chief Complaint  Patient presents with   Hip Pain    Stephanie Woodward is a 83 y.o. female.  Patient presents to the emergency department via EMS secondary to a fall.  Patient reports trying to roll over in bed when she slipped out falling to her buttocks on the floor.  She complained of some right sided hip pain at the time of the fall.  She was on the floor for approximately 20 minutes.  She denies hitting her head and denies losing consciousness.  The patient does endorse taking Eliquis .  She uses a wheelchair for ambulation at baseline.  Past medical history significant for end-stage renal disease on hemodialysis Monday Wednesday Friday, type II DM, chronic combined systolic and diastolic heart failure, hypertension, dementia without behavioral disturbance   Hip Pain       Home Medications Prior to Admission medications   Medication Sig Start Date End Date Taking? Authorizing Provider  acetaminophen  (TYLENOL ) 500 MG tablet Take 1,000 mg by mouth in the morning and at bedtime.    [provider]  amiodarone  (PACERONE ) 200 MG tablet Take 200 mg by mouth daily.    [provider]  atorvastatin  (LIPITOR ) 80 MG tablet Take 80 mg by mouth at bedtime.    [provider]  Cholecalciferol  (VITAMIN D3) 25 MCG (1000 UT) tablet Take 1,000 Units by mouth daily.    [provider]  ELIQUIS  5 MG TABS tablet Take 1 tablet by mouth twice daily 07/23/20   McLean, Dalton S, MD  Insulin  Glargine (SEMGLEE  [place]) Inject 16 Units into the skin in the morning.    [provider]  Insulin  Pen Needle 32G X 4 MM MISC 1 Device by Does not apply route in the morning, at noon, in the evening, and at bedtime. 02/06/20   Shamleffer, Ibtehal Jaralla, MD  lidocaine  (LINDAMANTLE) 3 % CREA cream Apply 1 application  topically every Monday,  Wednesday, and Friday. Prior to dialysis 03/06/20   [provider]  Lidocaine  4 % PTCH Place 1 patch onto the skin daily as needed (for pain). 1/2 patch applied to shoulder or back 11/12/20   [provider]  midodrine  (PROAMATINE ) 10 MG tablet Take 10 mg by mouth 3 (three) times a week. Taken before dialysis on Monday, Wednesday, Friday Hold if SBP>130    [provider]  nitroGLYCERIN  (NITROSTAT ) 0.4 MG SL tablet Place 0.4 mg under the tongue every 5 (five) minutes as needed for chest pain.    [provider]  pantoprazole  (PROTONIX ) 40 MG tablet Take 1 tablet (40 mg total) by mouth 2 (two) times daily. 05/20/22   Bryn Bernardino NOVAK, MD  Pramoxine-Calamine (AVEENO ANTI-ITCH EX) Apply 1 application topically every 6 (six) hours as needed (itching). Patient not taking: Reported on 01/13/2023    [provider]      Allergies    Egg-derived products, Lisinopril, Penicillins, and Other    Review of Systems   Review of Systems  Physical Exam Updated Vital Signs BP (!) 112/36   Pulse (!) 52   Resp 15   Ht 5' 2 (1.575 m)   Wt 76 kg   SpO2 99%   BMI 30.65 kg/m  Physical Exam Vitals and nursing note reviewed.  Constitutional:      General: She is not in acute distress.  Appearance: She is well-developed.  HENT:     Head: Normocephalic and atraumatic.  Eyes:     Conjunctiva/sclera: Conjunctivae normal.  Cardiovascular:     Rate and Rhythm: Normal rate and regular rhythm.  Pulmonary:     Effort: Pulmonary effort is normal.     Breath sounds: Normal breath sounds.  Musculoskeletal:        General: Tenderness present. No swelling.     Cervical back: Neck supple.     Comments: Patient with very minimal shortening and external rotation of the right lower extremity.  Significant pain with passive range of motion of the right hip.  Mild swelling noted around the right knee.  Distal perfusion, sensation, motor intact  Skin:    General: Skin is  warm and dry.     Capillary Refill: Capillary refill takes less than 2 seconds.  Neurological:     Mental Status: She is alert. Mental status is at baseline.  Psychiatric:        Mood and Affect: Mood normal.     ED Results / Procedures / Treatments   Labs (all labs ordered are listed, but only abnormal results are displayed) Labs Reviewed  BASIC METABOLIC PANEL - Abnormal; Notable for the following components:      Result Value   Chloride 96 (*)    BUN 29 (*)    Creatinine, Ser 6.94 (*)    GFR, Estimated 6 (*)    Anion gap 16 (*)    All other components within normal limits  CBC WITH DIFFERENTIAL/PLATELET - Abnormal; Notable for the following components:   WBC 12.2 (*)    RBC 3.85 (*)    Neutro Abs 10.1 (*)    Abs Immature Granulocytes 0.09 (*)    All other components within normal limits    EKG EKG Interpretation Date/Time:  Wednesday March 02 2023 01:31:22 EST Ventricular Rate:  59 PR Interval:  214 QRS Duration:  139 QT Interval:  484 QTC Calculation: 480 R Axis:   261  Text Interpretation: Sinus rhythm Atrial premature complex Borderline prolonged PR interval Consider right atrial enlargement Nonspecific IVCD with LAD Anterolateral infarct, old Confirmed by Theadore Sharper [phone]) on 03/02/2023 3:05:34 AM  Radiology DG Knee Right Port Result Date: 03/02/2023 CLINICAL DATA:  Right hip pain, leg pain EXAM: PORTABLE RIGHT KNEE - 1-2 VIEW COMPARISON:  None Available. 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 No joint effusion. No acute bony abnormality. Specifically, no fracture, subluxation, or dislocation. Vascular calcifications present. Soft tissues are intact. IMPRESSION: Advanced tricompartment degenerative changes. No acute bony abnormality. Electronically Signed   By: Franky Crease M.D.   On: 03/02/2023 02:30   DG Hip Unilat W or Wo Pelvis 2-3 Views Right Result Date: 03/02/2023 CLINICAL DATA:  Right hip pain EXAM: DG HIP (WITH OR  WITHOUT PELVIS) 2-3V RIGHT COMPARISON:  None Available. FINDINGS: There is a right femoral neck fracture. Mild varus angulation. Advanced degenerative changes in the hips bilaterally with joint space narrowing and spurring. Diffuse osteopenia. Diffuse vascular calcifications. IMPRESSION: Right femoral neck fracture with varus angulation. 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 Osteopenia. Electronically Signed   By: Franky Crease M.D.   On: 03/02/2023 02:14    Procedures Procedures    Medications Ordered in ED Medications  acetaminophen  (TYLENOL ) tablet 1,000 mg (1,000 mg Oral Patient Refused/Not Given 03/02/23 0243)  morphine  (PF) 2 MG/ML injection 2 mg (has no administration in time range)  morphine  (PF) 2 MG/ML injection 2 mg (  2 mg Intravenous Given 03/02/23 0241)  ondansetron  (ZOFRAN ) injection 4 mg (4 mg Intravenous Given 03/02/23 0318)  diphenhydrAMINE  (BENADRYL ) injection 25 mg (25 mg Intravenous Given 03/02/23 0318)    ED Course/ Medical Decision Making/ A&P                                 Medical Decision Making Amount and/or Complexity of Data Reviewed Labs: ordered. Radiology: ordered.  Risk OTC drugs. Prescription drug management.   This patient presents to the ED for concern of hip pain, this involves an extensive number of treatment options, and is a complaint that carries with it a high risk of complications and morbidity.  The differential diagnosis includes fracture, dislocation, soft tissue injury, others   Co morbidities that complicate the patient evaluation  End-stage renal disease on hemodialysis, Eliquis  usage, heart failure   Additional history obtained:  Additional history obtained from EMS and patient's daughters at bedside External records from outside source obtained and reviewed including neurology notes from November 21   Lab Tests:  I Ordered, and personally interpreted labs.  The pertinent results include: Mild leukocytosis with white  count of 12,200, creatinine 6.94 consistent with end-stage renal disease   Imaging Studies ordered:  I ordered imaging studies including plain films of the right hip and knee I independently visualized and interpreted imaging which showed  Right femoral neck fracture with varus angulation.    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    Osteopenia.   Advanced tricompartment degenerative changes. No acute bony  abnormality.   I agree with the radiologist interpretation   Cardiac Monitoring: / EKG:  The patient was maintained on a cardiac monitor.  I personally viewed and interpreted the cardiac monitored which showed an underlying rhythm of: Sinus rhythm   Consultations Obtained:  I requested consultation with the orthopedic surgeon, Dr. Sharl,  and discussed lab and imaging findings as well as pertinent plan - they recommend: Hold Eliquis .  Will likely need surgical intervention  Requested consultation with the hospitalist service.  Dr. Lonzell agrees to admit the patient    Problem List / ED Course / Critical interventions / Medication management   I ordered medication including morphine  for pain, Zofran  for nausea, Benadryl  for itching related to the morphine  Reevaluation of the patient after these medicines showed that the patient improved I have reviewed the patients home medicines and have made adjustments as needed    Test / Admission - Considered:  Patient with acute femoral neck fracture on the right side.  Will need hospital admission for likely surgical management.         Final Clinical Impression(s) / ED Diagnoses Final diagnoses:  Closed displaced fracture of right femoral neck [facility])    Rx / DC Orders ED Discharge Orders     None         Logan Ubaldo KATHEE DEVONNA 03/02/23 [id number]    Theadore Ozell HERO, MD 03/02/23 [phone]

## 2023-03-02 NOTE — Assessment & Plan Note (Signed)
Chronic, stable, baseline. 

## 2023-03-02 NOTE — Assessment & Plan Note (Signed)
 Call nephro in AM for IP dialysis during stay.

## 2023-03-02 NOTE — Anesthesia Procedure Notes (Signed)
 Anesthesia Regional Block: Femoral nerve block   Pre-Anesthetic Checklist: , timeout performed,  Correct Patient, Correct Site, Correct Laterality,  Correct Procedure, Correct Position, site marked,  Risks and benefits discussed,  Surgical consent,  Pre-op  evaluation,  At surgeon's request and post-op pain management  Laterality: Right  Prep: chloraprep       Needles:  Injection technique: Single-shot  Needle Type: Echogenic Needle     Needle Length: 10cm  Needle Gauge: 21     Additional Needles:   Narrative:  Start time: 03/02/2023 3:02 PM End time: 03/02/2023 3:05 PM Injection made incrementally with aspirations every 5 mL.  Performed by: Personally  Anesthesiologist: Lucious Debby BRAVO, MD  Additional Notes: No pain on injection. No increased resistance to injection. Injection made in 5cc increments. Good needle visualization. Patient tolerated the procedure well.

## 2023-03-02 NOTE — Progress Notes (Signed)
Pt. Off unit to HD 

## 2023-03-02 NOTE — ED Triage Notes (Signed)
 Pt BIB [facility] from home. Per pt she slid out of the bed to her knees; laid in the floor for 20 mins until the daughter arrived. Pt c/o R hip pain, no obvious signs of deformity.   Pt does take Eliquis

## 2023-03-02 NOTE — Consult Note (Signed)
 Renal Service Consult Note [facility]  Stephanie Woodward 03/02/2023 Stephanie JONETTA Fret, MD Requesting Physician: Dr. Zita  Reason for Consult: ESRD pt w/ hip fracture HPI: The patient is a 83 y.o. year-old w/ PMH as below who presented to ED after falling out of bed yesterday w/ R hip pain. In ED xrays showed R femoral neck fracture. Pt admitted. We are asked to see for dialysis.   Pt seen in room. Alert. Last HD was Monday. Good compliance. No SOB or cough. States has had prolonged bleeding from AVF the last 2 sessions.   ROS - denies CP, no joint pain, no HA, no blurry vision, no rash, no diarrhea, no nausea/ vomiting, no dysuria, no difficulty voiding   Past Medical History  Past Medical History:  Diagnosis Date   Arthritis    Asthma    Atrial fibrillation (HCC)    CHF (congestive heart failure) (HCC)    CKD (chronic kidney disease), stage III (HCC)    COPD (chronic obstructive pulmonary disease) (HCC)    Diabetes mellitus    INSULIN  DEPENDENT   Dialysis patient (HCC)    Gout    Heart murmur    no issues per pt   History of kidney stones    Hyperlipemia    Hypertension    Psoriasis    Renal disorder    congenital   Single kidney    Sleep apnea    doesn't use the Cpap   Past Surgical History  Past Surgical History:  Procedure Laterality Date   A/V FISTULAGRAM Left 10/31/2020   Procedure: A/V FISTULAGRAM;  Surgeon: Eliza Lonni RAMAN, MD;  Location: [facility] INVASIVE CV LAB;  Service: Cardiovascular;  Laterality: Left;   ABDOMINAL HYSTERECTOMY     AV FISTULA PLACEMENT Left 11/17/2018   Procedure: BRACHIO-CEPHALIC ARTERIOVENOUS (AV) FISTULA CREATION IN LEFT ARM;  Surgeon: Gretta Lonni PARAS, MD;  Location: [facility] OR;  Service: Vascular;  Laterality: Left;   CARDIOVERSION N/A 03/03/2021   Procedure: CARDIOVERSION;  Surgeon: Rolan Ezra RAMAN, MD;  Location: [facility] ENDOSCOPY;  Service: Cardiovascular;  Laterality: N/A;   CARDIOVERSION N/A 03/12/2021   Procedure:  CARDIOVERSION;  Surgeon: Rolan Ezra RAMAN, MD;  Location: [facility] ENDOSCOPY;  Service: Cardiovascular;  Laterality: N/A;   CHOLECYSTECTOMY     INSERTION OF DIALYSIS CATHETER Right 01/26/2019   Procedure: INSERTION OF DIALYSIS CATHETER, right internal jugular;  Surgeon: Eliza Lonni RAMAN, MD;  Location: [facility] OR;  Service: Vascular;  Laterality: Right;   LEFT AND RIGHT HEART CATHETERIZATION WITH CORONARY ANGIOGRAM N/A 11/29/2013   Procedure: LEFT AND RIGHT HEART CATHETERIZATION WITH CORONARY ANGIOGRAM;  Surgeon: Elsie RAMAN Somerset, MD;  Location: [facility] CATH LAB;  Service: Cardiovascular;  Laterality: N/A;   LEFT HEART CATH AND CORONARY ANGIOGRAPHY N/A 02/24/2021   Procedure: LEFT HEART CATH AND CORONARY ANGIOGRAPHY;  Surgeon: Rolan Ezra RAMAN, MD;  Location: [facility] INVASIVE CV LAB;  Service: Cardiovascular;  Laterality: N/A;   PERIPHERAL VASCULAR INTERVENTION Left 10/31/2020   Procedure: PERIPHERAL VASCULAR INTERVENTION;  Surgeon: Eliza Lonni RAMAN, MD;  Location: [facility] INVASIVE CV LAB;  Service: Cardiovascular;  Laterality: Left;   RIGHT HEART CATH N/A 11/23/2017   Procedure: RIGHT HEART CATH;  Surgeon: Rolan Ezra RAMAN, MD;  Location: [facility] INVASIVE CV LAB;  Service: Cardiovascular;  Laterality: N/A;   RIGHT/LEFT HEART CATH AND CORONARY ANGIOGRAPHY N/A 06/15/2019   Procedure: RIGHT/LEFT HEART CATH AND CORONARY ANGIOGRAPHY;  Surgeon: Rolan Ezra RAMAN, MD;  Location: [facility] INVASIVE CV LAB;  Service: Cardiovascular;  Laterality: N/A;  Family History  Family History  Problem Relation Age of Onset   Lupus Daughter    Cancer Mother 52       type unknown   CVA Father 2   Cancer Brother 20       type unknown   Social History  reports that she quit smoking about 14 years ago. Her smoking use included cigarettes. She started smoking about 34 years ago. She has a 20 pack-year smoking history. She has never used smokeless tobacco. She reports current alcohol use. She reports that she does not currently use drugs. Allergies   Allergies  Allergen Reactions   Egg-Derived Products Nausea And Vomiting   Lisinopril Nausea And Vomiting   Penicillins Nausea And Vomiting    Has patient had a PCN reaction causing immediate rash, facial/tongue/throat swelling, SOB or lightheadedness with hypotension: No Has patient had a PCN reaction causing severe rash involving mucus membranes or skin necrosis: No Has patient had a PCN reaction that required hospitalization: No Has patient had a PCN reaction occurring within the last 10 years: No If all of the above answers are NO, then may proceed with Cephalosporin use.    Tramadol      Hallucinations, mental status changes   Home medications Prior to Admission medications   Medication Sig Start Date End Date Taking? Authorizing Provider  acetaminophen  (TYLENOL ) 500 MG tablet Take 1,000 mg by mouth in the morning and at bedtime.   Yes [provider]  amiodarone  (PACERONE ) 200 MG tablet Take 200 mg by mouth daily.   Yes [provider]  atorvastatin  (LIPITOR ) 80 MG tablet Take 80 mg by mouth at bedtime.   Yes [provider]  Cholecalciferol  (VITAMIN D3) 25 MCG (1000 UT) tablet Take 1,000 Units by mouth daily.   Yes [provider]  ELIQUIS  5 MG TABS tablet Take 1 tablet by mouth twice daily Patient taking differently: Take 2.5 mg by mouth 2 (two) times daily. 07/23/20  Yes Rolan Ezra RAMAN, MD  Insulin  Glargine (SEMGLEE  [place]) Inject 20 Units into the skin in the morning.   Yes [provider]  lidocaine  (LINDAMANTLE) 3 % CREA cream Apply 1 application  topically every Monday, Wednesday, and Friday. Prior to dialysis 03/06/20  Yes [provider]  midodrine  (PROAMATINE ) 10 MG tablet Take 10 mg by mouth 3 (three) times a week. Taken before dialysis on Monday, Wednesday, Friday Hold if SBP>130   Yes [provider]  pantoprazole  (PROTONIX ) 40 MG tablet Take 1 tablet (40 mg total) by mouth 2 (two) times daily. 05/20/22  Yes  Bryn Bernardino NOVAK, MD  Pramoxine-Calamine (AVEENO ANTI-ITCH EX) Apply 1 application  topically every 6 (six) hours as needed (itching).   Yes [provider]  nitroGLYCERIN  (NITROSTAT ) 0.4 MG SL tablet Place 0.4 mg under the tongue every 5 (five) minutes as needed for chest pain. Patient not taking: Reported on 03/02/2023    [provider]     Vitals:   03/02/23 0234 03/02/23 0300 03/02/23 0531 03/02/23 0700  BP: (!) 114/32 (!) 112/36 98/81 (!) 106/43  Pulse: (!) 55 (!) 52 (!) 51   Resp: 17 15 (!) 22   Temp:   (!) 97.5 F (36.4 C) 97.7 F (36.5 C)  TempSrc:   Oral Oral  SpO2: 100% 99% 99%   Weight:      Height:       Exam Gen alert, no distress, [place] O2 3L  No rash, cyanosis or gangrene Sclera anicteric, throat  clear  No jvd or bruits Chest clear bilat to bases, no rales/ wheezing RRR no MRG Abd soft ntnd obese no mass or ascites +bs GU defer MS no joint effusions or deformity Ext darkening skin R > L pretib, trace edema pretib, no other edema Neuro is alert, Ox 3 , nf    LUA AVF+ bruit       Renal-related home meds: - others: amio, statin, eliquis  t bid, insulin , sl ntg, PPI    [place] HD: [place] M[place]  4h   350/600   77.3kg   2/2 bath   AVF LUA  Heparin  none - hectorol  9 mcg  - last Hb 12.0 - gets to dry wt   Assessment/ Plan: R hip fracture - after a fall at home. For surgery tomorrow.  ESRD - on HD MWF. No missed HD. Stable labs/ volume. HD today/ tonight.  HD access bleeding - has prolonged post HD bleeding the last 2 HD sessions. Not on heparin  w/ HD, but is on eliquis . Maybe some thinning of the AVF skin. Will d/w colleagues.  BP - normal bp's in OP setting, uses pre HD midodrine  Volume - no vol excess on exam. CXR + vasc congestion, under dry wt by wts. BP's soft as above. UF 2.5 L as tolerated.  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 Follow.  Secondary hyperparathyroidism - Ca in range, add on phos/ alb. Not sure about binders.  Dementia -  stable, chronic DM2 PAF - on eliquis , amiodarone  at home Combined diast/ syst CHF - EF improved to 55-60%, still G3DD      Myer Fret  MD [facility] 03/02/2023, 12:57 PM  Recent Labs  Lab 03/02/23 0234  HGB 12.3  CALCIUM  8.9  CREATININE 6.94*  K 4.0   Inpatient medications:  acetaminophen   1,000 mg Oral Once   amiodarone   200 mg Oral Daily   atorvastatin   80 mg Oral QHS   insulin  aspart  0-6 Units Subcutaneous Q4H    HYDROcodone -acetaminophen , HYDROmorphone  (DILAUDID ) injection, midodrine 

## 2023-03-02 NOTE — Assessment & Plan Note (Signed)
 Listed as combined CHF; however, it looked like EF had recovered as of most recent echo (04/2022), with EF 55-60% (compared to 25-30% in 2022).  Still, did have grade 3 DD. As a result between the CHF history + dialysis history + poor functional baseline (wheelchair at baseline) will be higher than usual risk for surgery for age. GUPTA score = 4.1%

## 2023-03-02 NOTE — Assessment & Plan Note (Signed)
 BGL running low end of normal (73). Hold home lantus for this morning Use very sensitive SSI Q4H

## 2023-03-02 NOTE — H&P (Addendum)
 History and Physical    Patient: Stephanie Woodward FMW:[id number] DOB: Jun 30, 1940 DOA: 03/02/2023 DOS: the patient was seen and examined on 03/02/2023 PCP: [facility]  Patient coming from: Home  Chief Complaint:  Chief Complaint  Patient presents with   Hip Pain   HPI: Stephanie Woodward is a 83 y.o. female with medical history significant of CKD 3, HFpEF (nl EF, grade 3 DD 04/2022) , A.Fib on eliquis , ESRD on MWF HD, DM2, dementia without behavioral disturbance.  Pt in to ED with c/o R sided hip pain following a fall.  Patient reports trying to roll over in bed when she slipped out falling to her buttocks on the floor.  Uses wheelchair for mobility at baseline.  Review of Systems: As mentioned in the history of present illness. All other systems reviewed and are negative. Past Medical History:  Diagnosis Date   Arthritis    Asthma    Atrial fibrillation (HCC)    CHF (congestive heart failure) (HCC)    CKD (chronic kidney disease), stage III (HCC)    COPD (chronic obstructive pulmonary disease) (HCC)    Diabetes mellitus    INSULIN  DEPENDENT   Dialysis patient (HCC)    Gout    Heart murmur    no issues per pt   History of kidney stones    Hyperlipemia    Hypertension    Psoriasis    Renal disorder    congenital   Single kidney    Sleep apnea    doesn't use the Cpap   Past Surgical History:  Procedure Laterality Date   A/V FISTULAGRAM Left 10/31/2020   Procedure: A/V FISTULAGRAM;  Surgeon: Eliza Lonni RAMAN, MD;  Location: [facility] INVASIVE CV LAB;  Service: Cardiovascular;  Laterality: Left;   ABDOMINAL HYSTERECTOMY     AV FISTULA PLACEMENT Left 11/17/2018   Procedure: BRACHIO-CEPHALIC ARTERIOVENOUS (AV) FISTULA CREATION IN LEFT ARM;  Surgeon: Gretta Lonni PARAS, MD;  Location[facility] OR;  Service: Vascular;  Laterality: Left;   CARDIOVERSION N/A 03/03/2021   Procedure: CARDIOVERSION;  Surgeon: Rolan Ezra RAMAN, MD;  Location: [facility] ENDOSCOPY;   Service: Cardiovascular;  Laterality: N/A;   CARDIOVERSION N/A 03/12/2021   Procedure: CARDIOVERSION;  Surgeon: Rolan Ezra RAMAN, MD;  Location: [facility] ENDOSCOPY;  Service: Cardiovascular;  Laterality: N/A;   CHOLECYSTECTOMY     INSERTION OF DIALYSIS CATHETER Right 01/26/2019   Procedure: INSERTION OF DIALYSIS CATHETER, right internal jugular;  Surgeon: Eliza Lonni RAMAN, MD;  Location: [facility] OR;  Service: Vascular;  Laterality: Right;   LEFT AND RIGHT HEART CATHETERIZATION WITH CORONARY ANGIOGRAM N/A 11/29/2013   Procedure: LEFT AND RIGHT HEART CATHETERIZATION WITH CORONARY ANGIOGRAM;  Surgeon: Elsie RAMAN Somerset, MD;  Location: [facility] CATH LAB;  Service: Cardiovascular;  Laterality: N/A;   LEFT HEART CATH AND CORONARY ANGIOGRAPHY N/A 02/24/2021   Procedure: LEFT HEART CATH AND CORONARY ANGIOGRAPHY;  Surgeon: Rolan Ezra RAMAN, MD;  Location: [facility] INVASIVE CV LAB;  Service: Cardiovascular;  Laterality: N/A;   PERIPHERAL VASCULAR INTERVENTION Left 10/31/2020   Procedure: PERIPHERAL VASCULAR INTERVENTION;  Surgeon: Eliza Lonni RAMAN, MD;  Location: [facility] INVASIVE CV LAB;  Service: Cardiovascular;  Laterality: Left;   RIGHT HEART CATH N/A 11/23/2017   Procedure: RIGHT HEART CATH;  Surgeon: Rolan Ezra RAMAN, MD;  Location: [facility] INVASIVE CV LAB;  Service: Cardiovascular;  Laterality: N/A;   RIGHT/LEFT HEART CATH AND CORONARY ANGIOGRAPHY N/A 06/15/2019   Procedure: RIGHT/LEFT HEART CATH AND CORONARY ANGIOGRAPHY;  Surgeon: Rolan Ezra RAMAN, MD;  Location: [facility] INVASIVE CV LAB;  Service: Cardiovascular;  Laterality: N/A;   Social History:  reports that she quit smoking about 14 years ago. Her smoking use included cigarettes. She started smoking about 34 years ago. She has a 20 pack-year smoking history. She has never used smokeless tobacco. She reports current alcohol use. She reports that she does not currently use drugs.  Allergies  Allergen Reactions   Egg-Derived Products Nausea And Vomiting   Lisinopril Nausea And  Vomiting   Penicillins Nausea And Vomiting    Has patient had a PCN reaction causing immediate rash, facial/tongue/throat swelling, SOB or lightheadedness with hypotension: No Has patient had a PCN reaction causing severe rash involving mucus membranes or skin necrosis: No Has patient had a PCN reaction that required hospitalization: No Has patient had a PCN reaction occurring within the last 10 years: No If all of the above answers are NO, then may proceed with Cephalosporin use.    Other Other (See Comments)    Family History  Problem Relation Age of Onset   Lupus Daughter    Cancer Mother 29       type unknown   CVA Father 51   Cancer Brother 9       type unknown    Prior to Admission medications   Medication Sig Start Date End Date Taking? Authorizing Provider  acetaminophen  (TYLENOL ) 500 MG tablet Take 1,000 mg by mouth in the morning and at bedtime.    [provider]  amiodarone  (PACERONE ) 200 MG tablet Take 200 mg by mouth daily.    [provider]  atorvastatin  (LIPITOR ) 80 MG tablet Take 80 mg by mouth at bedtime.    [provider]  Cholecalciferol  (VITAMIN D3) 25 MCG (1000 UT) tablet Take 1,000 Units by mouth daily.    [provider]  ELIQUIS  5 MG TABS tablet Take 1 tablet by mouth twice daily 07/23/20   McLean, Dalton S, MD  Insulin  Glargine (SEMGLEE  [place]) Inject 16 Units into the skin in the morning.    [provider]  Insulin  Pen Needle 32G X 4 MM MISC 1 Device by Does not apply route in the morning, at noon, in the evening, and at bedtime. 02/06/20   Shamleffer, Ibtehal Jaralla, MD  lidocaine  (LINDAMANTLE) 3 % CREA cream Apply 1 application  topically every Monday, Wednesday, and Friday. Prior to dialysis 03/06/20   [provider]  Lidocaine  4 % PTCH Place 1 patch onto the skin daily as needed (for pain). 1/2 patch applied to shoulder or back 11/12/20   [provider]  midodrine  (PROAMATINE ) 10 MG tablet  Take 10 mg by mouth 3 (three) times a week. Taken before dialysis on Monday, Wednesday, Friday Hold if SBP>130    [provider]  nitroGLYCERIN  (NITROSTAT ) 0.4 MG SL tablet Place 0.4 mg under the tongue every 5 (five) minutes as needed for chest pain.    [provider]  pantoprazole  (PROTONIX ) 40 MG tablet Take 1 tablet (40 mg total) by mouth 2 (two) times daily. 05/20/22   Bryn Bernardino NOVAK, MD  Pramoxine-Calamine (AVEENO ANTI-ITCH EX) Apply 1 application topically every 6 (six) hours as needed (itching). Patient not taking: Reported on 01/13/2023    [provider]    Physical Exam: Vitals:   03/02/23 0139 03/02/23 0215 03/02/23 0234 03/02/23 0300  BP:  (!) 130/48 (!) 114/32 (!) 112/36  Pulse:  (!) 55 (!) 55 (!) 52  Resp:  16 17 15   SpO2:  100% 100% 99%  Weight: 76 kg     Height: 5' 2 (1.575 m)      Constitutional: pt comfortable while asleep, though wakes up easily, in some pain when awake Respiratory: clear to auscultation bilaterally, no wheezing, no crackles. Normal respiratory effort. No accessory muscle use.  Cardiovascular: Regular rate and rhythm, no murmurs / rubs / gallops. No extremity edema. 2+ pedal pulses. No carotid bruits.  Abdomen: no tenderness, no masses palpated. No hepatosplenomegaly. Bowel sounds positive.  Musculoskeletal: R hip TTP Neurologic: CN 2-12 grossly intact. Sensation intact, DTR normal. Strength 5/5 in all 4.  Psychiatric: Sedated due to opiates.  Data Reviewed:    Labs on Admission: I have personally reviewed following labs and imaging studies  CBC: Recent Labs  Lab 03/02/23 0234  WBC 12.2*  NEUTROABS 10.1*  HGB 12.3  HCT 38.5  MCV 100.0  PLT 197   Basic Metabolic Panel: Recent Labs  Lab 03/02/23 0234  NA 138  K 4.0  CL 96*  CO2 26  GLUCOSE 71  BUN 29*  CREATININE 6.94*  CALCIUM  8.9   GFR: Estimated Creatinine Clearance: 6.2 mL/min (A) (by C-G formula based on SCr of 6.94 mg/dL (H)). Liver Function  Tests: No results for input(s): AST, ALT, ALKPHOS, BILITOT, PROT, ALBUMIN  in the last 168 hours. No results for input(s): LIPASE, AMYLASE in the last 168 hours. No results for input(s): AMMONIA in the last 168 hours. Coagulation Profile: No results for input(s): INR, PROTIME in the last 168 hours. Cardiac Enzymes: No results for input(s): CKTOTAL, CKMB, CKMBINDEX, TROPONINI in the last 168 hours. BNP (last 3 results) No results for input(s): PROBNP in the last 8760 hours. HbA1C: No results for input(s): HGBA1C in the last 72 hours. CBG: No results for input(s): GLUCAP in the last 168 hours. Lipid Profile: No results for input(s): CHOL, HDL, LDLCALC, TRIG, CHOLHDL, LDLDIRECT in the last 72 hours. Thyroid  Function Tests: No results for input(s): TSH, T4TOTAL, FREET4, T3FREE, THYROIDAB in the last 72 hours. Anemia Panel: No results for input(s): VITAMINB12, FOLATE, FERRITIN, TIBC, IRON, RETICCTPCT in the last 72 hours. Urine analysis:    Component Value Date/Time   COLORURINE YELLOW 04/25/2021 1331   APPEARANCEUR CLEAR 04/25/2021 1331   LABSPEC 1.015 04/25/2021 1331   PHURINE 6.0 04/25/2021 1331   GLUCOSEU NEGATIVE 04/25/2021 1331   HGBUR SMALL (A) 04/25/2021 1331   BILIRUBINUR NEGATIVE 04/25/2021 1331   KETONESUR NEGATIVE 04/25/2021 1331   PROTEINUR >=300 (A) 04/25/2021 1331   UROBILINOGEN 0.2 01/23/2019 1718   NITRITE NEGATIVE 04/25/2021 1331   LEUKOCYTESUR NEGATIVE 04/25/2021 1331    Radiological Exams on Admission: DG Knee Right Port Result Date: 03/02/2023 CLINICAL DATA:  Right hip pain, leg pain EXAM: PORTABLE RIGHT KNEE - 1-2 VIEW COMPARISON:  None Available. 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 No joint effusion. No acute bony abnormality. Specifically, no fracture, subluxation, or dislocation. Vascular calcifications present. Soft tissues are intact. IMPRESSION:  Advanced tricompartment degenerative changes. No acute bony abnormality. Electronically Signed   By: Franky Crease M.D.   On: 03/02/2023 02:30   DG Hip Unilat W or Wo Pelvis 2-3 Views Right Result Date: 03/02/2023 CLINICAL DATA:  Right hip pain EXAM: DG HIP (WITH OR WITHOUT PELVIS) 2-3V RIGHT COMPARISON:  None Available. FINDINGS: There is a right femoral neck fracture. Mild varus angulation. Advanced degenerative changes in the hips bilaterally with joint space narrowing and spurring. Diffuse osteopenia. Diffuse vascular calcifications. IMPRESSION: Right femoral neck fracture with varus angulation.  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 Osteopenia. Electronically Signed   By: Franky Crease M.D.   On: 03/02/2023 02:14    EKG: Independently reviewed.   Assessment and Plan: * Closed displaced fracture of right femoral neck (HCC) Hip fx pathway NPO Hold eliquis  Pain control with PRN norco or PRN IV dilaudid  - watch for oversedation EDP d/w Dr. Sharl, ortho will see in AM  Hypotension Continue midodrine  before dialysis on MWF.  ESRD on hemodialysis (HCC) Call nephro in AM for IP dialysis during stay.  Chronic combined systolic and diastolic heart failure (HCC) Listed as combined CHF; however, it looked like EF had recovered as of most recent echo (04/2022), with EF 55-60% (compared to 25-30% in 2022).  Still, did have grade 3 DD. As a result between the CHF history + dialysis history + poor functional baseline (wheelchair at baseline) will be higher than usual risk for surgery for age. GUPTA score = 4.1%  Dementia without behavioral disturbance (HCC) Chronic, stable, baseline  Paroxysmal atrial fibrillation (HCC) Hold eliquis  Cont amiodarone   Type 2 diabetes mellitus, controlled, with renal complications (HCC) BGL running low end of normal (73). Hold home lantus  for this morning Use very sensitive SSI Q4H      Advance Care Planning:   Code Status: Full Code Confirmed  with family  Consults: EDP d/w Dr. Sharl  Family Communication: Family at bedside, ortho please call them to discuss before surgery.  Severity of Illness: The appropriate patient status for this patient is INPATIENT. Inpatient status is judged to be reasonable and necessary in order to provide the required intensity of service to ensure the patient's safety. The patient's presenting symptoms, physical exam findings, and initial radiographic and laboratory data in the context of their chronic comorbidities is felt to place them at high risk for further clinical deterioration. Furthermore, it is not anticipated that the patient will be medically stable for discharge from the hospital within 2 midnights of admission.   * I certify that at the point of admission it is my clinical judgment that the patient will require inpatient hospital care spanning beyond 2 midnights from the point of admission due to high intensity of service, high risk for further deterioration and high frequency of surveillance required.*  Author: Kamarius Buckbee M., DO 03/02/2023 3:53 AM  For on call review [email or web address].

## 2023-03-02 NOTE — Progress Notes (Signed)
 Pt. Arrived on unit,initially alert to person and situation, once moved to bed from stretcher pt. become unresponsive. T-97.5, BP 98/81)(87) HR 41, RR-22, O2 70 room air, Blood glucose 115, Pt respond after a few sternal rubs. Oxygen and cardiac monitoring applied. MD notified see Mar for new orders

## 2023-03-02 NOTE — Progress Notes (Addendum)
 Initial Nutrition Assessment  DOCUMENTATION CODES:   Not applicable  INTERVENTION:   -Recommend to liberalize diet to regular due to inadequate intakes, continue physician ordered 1200 mL fluid restriction.  -Provide Magic cup BID with meals, each supplement provides 290 kcal and 9 grams of protein to help meet estimated needs.  -Renal MVI.    NUTRITION DIAGNOSIS:   Increased nutrient needs related to hip fracture, post-op healing as evidenced by estimated needs.    GOAL:   Patient will meet greater than or equal to 90% of their needs    MONITOR:   PO intake  REASON FOR ASSESSMENT:   Consult Assessment of nutrition requirement/status  ASSESSMENT: 83 y/o female presented to the ED with right side hip pain after a fall. Uses wheelchair for mobility at baseline. Admitted with closed displaced fracture of right femoral neck.  PMH: CKD3, heart failure with preserved ejection fraction, A-fib, ESRD on HD 3x weekly, DM2, dementia, asthma, COPD, gout, heart murmur, kidney stones, HLD, HTN, psoriasis, congenital renal disorder, single kidney, sleep apnea-does not use CPAP, cholecystectomy, peripheral vascular intervention, smoking hx.  Intakes recorded so far 0 x 1 meal. Patient does not follow a renal diet at home. She does however restrict her carbohydrate allowance per meal to 42 gm.  This was put into place a while ago according to her daughter during a time when her blood sugars were difficult to control. Both are amenable to liberalize diet to regular due to increased nutrition needs to support healing postop. Edentulous, able to chew all foods. Patient noted to participate in regular physical through the [facility] program and also exercises at home.  Per review of EMR, no significant weight loss past 24 months identified.   Medications reviewed and include novolog  SS Q4 hrs.  Labs: CBG 84-115  NUTRITION - FOCUSED PHYSICAL EXAM:  Flowsheet Row Most Recent Value  Orbital Region  No depletion  Upper Arm Region No depletion  Thoracic and Lumbar Region No depletion  Buccal Region No depletion  Temple Region No depletion  Clavicle Bone Region No depletion  Clavicle and Acromion Bone Region No depletion  Scapular Bone Region No depletion  Dorsal Hand No depletion  Patellar Region No depletion  Anterior Thigh Region No depletion  Posterior Calf Region No depletion  Edema (RD Assessment) Mild  [R leg]  Hair Reviewed  Eyes Reviewed  Mouth Reviewed  Skin Reviewed  Nails Unable to assess  [painted nails]       Diet Order:   Diet Order             Diet NPO time specified  Diet effective midnight           Diet renal with fluid restriction Fluid restriction: 1200 mL Fluid; Room service appropriate? Yes; Fluid consistency: Thin  Diet effective now                   EDUCATION NEEDS:   No education needs have been identified at this time  Skin:  Skin Assessment: Reviewed RN Assessment  Last BM:  prior to admission  Height:   Ht Readings from Last 1 Encounters:  03/02/23 5' 2 (1.575 m)    Weight:   Wt Readings from Last 1 Encounters:  03/02/23 76 kg    Ideal Body Weight:  52.3 kg  BMI:  Body mass index is 30.65 kg/m.  Estimated Nutritional Needs:   Kcal:  1450-1700 kcal/day  Protein:  63-78 gm/day  Fluid:  1200 mL/day fluid  restriction    Elveria Sable, RDLD Clinical Dietitian If unable to reach, please contact RD Inpatient secure chat group between 8 am-4 pm daily

## 2023-03-02 NOTE — Progress Notes (Signed)
 PROGRESS NOTE  Stephanie Woodward  DOB: August 07, 1940  PCP: [facility] FMW:[id number]  DOA: 03/02/2023  LOS: 0 days  Hospital Day: 1  Brief narrative: Stephanie Woodward is a 83 y.o. female with PMH significant for ESRD HD MWF, dementia with behavioral disturbance, DM2, HTN, HLD, CHF, A-fib on Eliquis , CKD, COPD, obesity, sleep apnea 9/8, patient was brought to the ED from home after a fall..  Patient was apparently trying to roll over in bed when she slipped out falling to the floor on her buttocks.  She was on the floor for 20 minutes until her daughter arrived.  Complained of right hip pain.  EMS was called and patient was brought to the ED. At baseline,, she uses a wheelchair for mobility.  Initial labs with WBC count 12.2 X-ray showed closed displaced fracture of right femoral neck Admitted to [facility]oped[facility] Dr. Sharl was consulted for surgical fixation  Subjective: Patient was seen and examined this morning. Elderly African-American female.  Lying on bed.  Not in distress on low-flow oxygen. Daughter at bedside. Afebrile, heart rate in 50s, blood pressure in low tolerable range  Assessment and plan: Closed displaced fracture of right femoral neck Secondary to a fall from bed Imaging as above Plan for surgical fixation Thursday versus Friday Eliquis  on hold Pain control with scheduled Tylenol , as needed Norco, as needed IV Dilaudid  Bowel regimen DVT prophylaxis -SCDs for now PT OT eval after procedure   ESRD on hemodialysis Nephro consulted.  Chronic combined systolic and diastolic CHF  Hypotension Most recent echo from March 2024 with EF 55 to 60% and grade 2 diastolic dysfunction  Because of chronic hypertension, patient is on midodrine  before dialysis on MWF. Continue the same  Type 2 diabetes mellitus A1c 7.9 on 05/19/2022 PTA meds- Lantus  20 units in a.m. Currently on SSI/Accu-Cheks. Recent Labs  Lab 03/02/23 0421 03/02/23 0526  03/02/23 1137  GLUCAP 84 115* 102*   Dementia without behavioral disturbance  Chronic, stable, baseline   Paroxysmal atrial fibrillation Cont amiodarone  Eliquis  on hold for surgery  GERD PPI   Mobility: Needs PT eval postprocedure  Goals of care   Code Status: Full Code     DVT prophylaxis:  Place and maintain sequential compression device Start: 03/02/23 1443   Antimicrobials: None Fluid: None Consultants: Orthopedics Family Communication: Daughter at bedside  Status: Inpatient Level of care:  Med-Surg   Patient is from: Home Needs to continue in-hospital care: Pending surgery Anticipated d/c to: Pending clinical course      Diet:  Diet Order             Diet NPO time specified  Diet effective midnight           Diet renal with fluid restriction Fluid restriction: 1200 mL Fluid; Room service appropriate? Yes; Fluid consistency: Thin  Diet effective now                   Scheduled Meds:  acetaminophen   1,000 mg Oral Once   amiodarone   200 mg Oral Daily   atorvastatin   80 mg Oral QHS   [START ON 03/03/2023] Chlorhexidine  Gluconate Cloth  6 each Topical Q0600   insulin  aspart  0-6 Units Subcutaneous Q4H   multivitamin  1 tablet Oral QHS   pantoprazole   40 mg Oral BID    PRN meds: HYDROcodone -acetaminophen , HYDROmorphone  (DILAUDID ) injection, midodrine    Infusions:    Antimicrobials: Anti-infectives (From admission, onward)    None  Objective: Vitals:   03/02/23 0700 03/02/23 1430  BP: (!) 106/43 (!) 117/49  Pulse:  (!) 57  Resp:  13  Temp: 97.7 F (36.5 C) 98 F (36.7 C)  SpO2:  94%    Intake/Output Summary (Last 24 hours) at 03/02/2023 1445 Last data filed at 03/02/2023 0900 Gross per 24 hour  Intake 0 ml  Output --  Net 0 ml   Filed Weights   03/02/23 0139  Weight: 76 kg   Weight change:  Body mass index is 30.65 kg/m.   Physical Exam: General exam: Pleasant, elderly.  Not in distress Skin: No rashes, lesions  or ulcers. HEENT: Atraumatic, normocephalic, no obvious bleeding Lungs: Clear to auscultation bilaterally,  CVS: S1, S2, no murmur,   GI/Abd: Soft, nontender, nondistended, bowel sound present,   CNS: Alert, awake, able to answer orientation questions Psychiatry: Mood appropriate,  Extremities: No pedal edema, no calf tenderness,   Data Review: I have personally reviewed the laboratory data and studies available.  F/u labs collected Unresulted Labs (From admission, onward)     Start     Ordered   03/02/23 1311  Hepatitis B surface antibody,quantitative  (New Admission Hemo Labs (Hepatitis B))  Once,   R        03/02/23 1316   03/02/23 0342  Hemoglobin A1c  Once,   R       Comments: To assess prior glycemic control    03/02/23 0341            Total time spent in review of labs and imaging, patient evaluation, formulation of plan, documentation and communication with family: 45 minutes  Signed, Chapman Rota, MD [facility] 03/02/2023

## 2023-03-03 ENCOUNTER — Encounter (HOSPITAL_COMMUNITY): Payer: Self-pay | Admitting: Internal Medicine

## 2023-03-03 ENCOUNTER — Other Ambulatory Visit: Payer: Self-pay

## 2023-03-03 ENCOUNTER — Inpatient Hospital Stay (HOSPITAL_COMMUNITY): Payer: Medicare (Managed Care) | Admitting: Certified Registered Nurse Anesthetist

## 2023-03-03 ENCOUNTER — Inpatient Hospital Stay (HOSPITAL_COMMUNITY): Payer: Medicare (Managed Care)

## 2023-03-03 ENCOUNTER — Encounter (HOSPITAL_COMMUNITY): Admission: EM | Disposition: E | Payer: Self-pay | Source: Home / Self Care | Attending: Internal Medicine

## 2023-03-03 DIAGNOSIS — S72001A Fracture of unspecified part of neck of right femur, initial encounter for closed fracture: Secondary | ICD-10-CM

## 2023-03-03 DIAGNOSIS — E119 Type 2 diabetes mellitus without complications: Secondary | ICD-10-CM

## 2023-03-03 DIAGNOSIS — J449 Chronic obstructive pulmonary disease, unspecified: Secondary | ICD-10-CM | POA: Diagnosis not present

## 2023-03-03 DIAGNOSIS — I2510 Atherosclerotic heart disease of native coronary artery without angina pectoris: Secondary | ICD-10-CM

## 2023-03-03 HISTORY — PX: HIP ARTHROPLASTY: SHX981

## 2023-03-03 LAB — POCT I-STAT, CHEM 8
BUN: 21 mg/dL (ref 8–23)
Calcium, Ion: 1.13 mmol/L — ABNORMAL LOW (ref 1.15–1.40)
Chloride: 95 mmol/L — ABNORMAL LOW (ref 98–111)
Creatinine, Ser: 5.3 mg/dL — ABNORMAL HIGH (ref 0.44–1.00)
Glucose, Bld: 158 mg/dL — ABNORMAL HIGH (ref 70–99)
HCT: 38 % (ref 36.0–46.0)
Hemoglobin: 12.9 g/dL (ref 12.0–15.0)
Potassium: 4.4 mmol/L (ref 3.5–5.1)
Sodium: 135 mmol/L (ref 135–145)
TCO2: 31 mmol/L (ref 22–32)

## 2023-03-03 LAB — HEMOGLOBIN A1C
Hgb A1c MFr Bld: 6.8 % — ABNORMAL HIGH (ref 4.8–5.6)
Mean Plasma Glucose: 148 mg/dL

## 2023-03-03 LAB — GLUCOSE, CAPILLARY
Glucose-Capillary: 191 mg/dL — ABNORMAL HIGH (ref 70–99)
Glucose-Capillary: 156 mg/dL — ABNORMAL HIGH (ref 70–99)
Glucose-Capillary: 143 mg/dL — ABNORMAL HIGH (ref 70–99)
Glucose-Capillary: 154 mg/dL — ABNORMAL HIGH (ref 70–99)
Glucose-Capillary: 163 mg/dL — ABNORMAL HIGH (ref 70–99)
Glucose-Capillary: 186 mg/dL — ABNORMAL HIGH (ref 70–99)
Glucose-Capillary: 223 mg/dL — ABNORMAL HIGH (ref 70–99)
Glucose-Capillary: 216 mg/dL — ABNORMAL HIGH (ref 70–99)

## 2023-03-03 LAB — HEPATITIS B SURFACE ANTIBODY, QUANTITATIVE: Hep B S AB Quant (Post): 3499 m[IU]/mL

## 2023-03-03 LAB — SURGICAL PCR SCREEN
MRSA, PCR: NEGATIVE
Staphylococcus aureus: POSITIVE — AB

## 2023-03-03 SURGERY — HEMIARTHROPLASTY, HIP, DIRECT ANTERIOR APPROACH, FOR FRACTURE
Anesthesia: General | Site: Hip | Laterality: Right

## 2023-03-03 MED ORDER — PHENOL 1.4 % MT LIQD: 1.0000 | OROMUCOSAL | Status: DC | PRN

## 2023-03-03 MED ORDER — CHLORHEXIDINE GLUCONATE 4 % EX SOLN: 1.0000 | CUTANEOUS | 1 refills | Status: DC

## 2023-03-03 MED ORDER — FENTANYL CITRATE (PF) 100 MCG/2ML IJ SOLN
25.0000 ug | INTRAMUSCULAR | Status: DC | PRN
  Administered 2023-03-03: 25 ug via INTRAVENOUS

## 2023-03-03 MED ORDER — DIPHENHYDRAMINE HCL 50 MG/ML IJ SOLN
25.0000 mg | Freq: Four times a day (QID) | INTRAMUSCULAR | Status: DC | PRN
  Administered 2023-03-03 – 2023-03-09 (×2): 25 mg via INTRAVENOUS
  Filled 2023-03-03 (×3): qty 1

## 2023-03-03 MED ORDER — METOCLOPRAMIDE HCL 5 MG/ML IJ SOLN
5.0000 mg | Freq: Three times a day (TID) | INTRAMUSCULAR | Status: DC | PRN
  Administered 2023-03-08: 10 mg via INTRAVENOUS
  Filled 2023-03-03: qty 2

## 2023-03-03 MED ORDER — OXYCODONE HCL 5 MG PO TABS: 5.0000 mg | ORAL_TABLET | Freq: Once | ORAL | Status: DC | PRN

## 2023-03-03 MED ORDER — METOCLOPRAMIDE HCL 5 MG PO TABS
5.0000 mg | ORAL_TABLET | Freq: Three times a day (TID) | ORAL | Status: DC | PRN
  Administered 2023-03-03: 5 mg via ORAL
  Filled 2023-03-03: qty 1

## 2023-03-03 MED ORDER — DOCUSATE SODIUM 100 MG PO CAPS
100.0000 mg | ORAL_CAPSULE | Freq: Two times a day (BID) | ORAL | Status: DC
Start: 2023-03-03 — End: 2023-03-04
  Administered 2023-03-03 (×2): 100 mg via ORAL
  Filled 2023-03-03 (×3): qty 1

## 2023-03-03 MED ORDER — LIDOCAINE 2% (20 MG/ML) 5 ML SYRINGE
INTRAMUSCULAR | Status: DC | PRN
  Administered 2023-03-03: 50 mg via INTRAVENOUS

## 2023-03-03 MED ORDER — SUGAMMADEX SODIUM 200 MG/2ML IV SOLN
INTRAVENOUS | Status: DC | PRN
  Administered 2023-03-03: 200 mg via INTRAVENOUS

## 2023-03-03 MED ORDER — POVIDONE-IODINE 10 % EX SWAB
2.0000 | Freq: Once | CUTANEOUS | Status: AC
  Administered 2023-03-03: 2 via TOPICAL

## 2023-03-03 MED ORDER — LIDOCAINE 2% (20 MG/ML) 5 ML SYRINGE
INTRAMUSCULAR | Status: AC
  Filled 2023-03-03: qty 5

## 2023-03-03 MED ORDER — PROPOFOL 10 MG/ML IV BOLUS
INTRAVENOUS | Status: AC
  Filled 2023-03-03: qty 20

## 2023-03-03 MED ORDER — EPHEDRINE SULFATE-NACL 50-0.9 MG/10ML-% IV SOSY
PREFILLED_SYRINGE | INTRAVENOUS | Status: DC | PRN
  Administered 2023-03-03 (×4): 5 mg via INTRAVENOUS

## 2023-03-03 MED ORDER — PROPOFOL 10 MG/ML IV BOLUS
INTRAVENOUS | Status: DC | PRN
  Administered 2023-03-03: 60 mg via INTRAVENOUS

## 2023-03-03 MED ORDER — LIDOCAINE-PRILOCAINE 2.5-2.5 % EX CREA
1.0000 | TOPICAL_CREAM | CUTANEOUS | Status: DC | PRN
  Filled 2023-03-03: qty 5

## 2023-03-03 MED ORDER — TRANEXAMIC ACID-NACL 1000-0.7 MG/100ML-% IV SOLN
INTRAVENOUS | Status: AC
  Filled 2023-03-03: qty 100

## 2023-03-03 MED ORDER — SUCCINYLCHOLINE CHLORIDE 200 MG/10ML IV SOSY
PREFILLED_SYRINGE | INTRAVENOUS | Status: AC
  Filled 2023-03-03: qty 10

## 2023-03-03 MED ORDER — NEPRO/CARBSTEADY PO LIQD: 237.0000 mL | ORAL | Status: DC | PRN

## 2023-03-03 MED ORDER — TRANEXAMIC ACID-NACL 1000-0.7 MG/100ML-% IV SOLN
1000.0000 mg | Freq: Once | INTRAVENOUS | Status: AC
  Administered 2023-03-03: 1000 mg via INTRAVENOUS
  Filled 2023-03-03: qty 100

## 2023-03-03 MED ORDER — MUPIROCIN 2 % EX OINT: 1.0000 | TOPICAL_OINTMENT | Freq: Two times a day (BID) | CUTANEOUS | 0 refills | Status: DC

## 2023-03-03 MED ORDER — FENTANYL CITRATE (PF) 250 MCG/5ML IJ SOLN
INTRAMUSCULAR | Status: DC | PRN
  Administered 2023-03-03 (×4): 25 ug via INTRAVENOUS

## 2023-03-03 MED ORDER — ONDANSETRON HCL 4 MG/2ML IJ SOLN
INTRAMUSCULAR | Status: AC
  Filled 2023-03-03: qty 2

## 2023-03-03 MED ORDER — VANCOMYCIN HCL 1000 MG IV SOLR
INTRAVENOUS | Status: AC
  Filled 2023-03-03: qty 20

## 2023-03-03 MED ORDER — FENTANYL CITRATE (PF) 100 MCG/2ML IJ SOLN
INTRAMUSCULAR | Status: AC
  Filled 2023-03-03: qty 2

## 2023-03-03 MED ORDER — HEPARIN SODIUM (PORCINE) 1000 UNIT/ML DIALYSIS: 1000.0000 [IU] | INTRAMUSCULAR | Status: DC | PRN

## 2023-03-03 MED ORDER — ROCURONIUM BROMIDE 10 MG/ML (PF) SYRINGE
PREFILLED_SYRINGE | INTRAVENOUS | Status: AC
  Filled 2023-03-03: qty 10

## 2023-03-03 MED ORDER — CHLORHEXIDINE GLUCONATE 0.12 % MT SOLN: 15.0000 mL | Freq: Once | OROMUCOSAL | Status: AC

## 2023-03-03 MED ORDER — FENTANYL CITRATE (PF) 250 MCG/5ML IJ SOLN
INTRAMUSCULAR | Status: AC
  Filled 2023-03-03: qty 5

## 2023-03-03 MED ORDER — CHLORHEXIDINE GLUCONATE CLOTH 2 % EX PADS
6.0000 | MEDICATED_PAD | Freq: Every day | CUTANEOUS | Status: DC
Start: 2023-03-04 — End: 2023-03-03

## 2023-03-03 MED ORDER — ORAL CARE MOUTH RINSE: 15.0000 mL | Freq: Once | OROMUCOSAL | Status: AC

## 2023-03-03 MED ORDER — DEXAMETHASONE SODIUM PHOSPHATE 10 MG/ML IJ SOLN
INTRAMUSCULAR | Status: DC | PRN
  Administered 2023-03-03: 5 mg via INTRAVENOUS

## 2023-03-03 MED ORDER — MENTHOL 3 MG MT LOZG
1.0000 | LOZENGE | OROMUCOSAL | Status: DC | PRN
  Filled 2023-03-03: qty 9

## 2023-03-03 MED ORDER — OXYCODONE HCL 5 MG/5ML PO SOLN: 5.0000 mg | Freq: Once | ORAL | Status: DC | PRN

## 2023-03-03 MED ORDER — ALBUMIN HUMAN 5 % IV SOLN: INTRAVENOUS | Status: DC | PRN

## 2023-03-03 MED ORDER — TRANEXAMIC ACID-NACL 1000-0.7 MG/100ML-% IV SOLN
1000.0000 mg | INTRAVENOUS | Status: AC
  Administered 2023-03-03: 1000 mg via INTRAVENOUS

## 2023-03-03 MED ORDER — PHENYLEPHRINE HCL-NACL 20-0.9 MG/250ML-% IV SOLN
INTRAVENOUS | Status: DC | PRN
  Administered 2023-03-03: 15 ug/min via INTRAVENOUS

## 2023-03-03 MED ORDER — ROCURONIUM BROMIDE 10 MG/ML (PF) SYRINGE
PREFILLED_SYRINGE | INTRAVENOUS | Status: DC | PRN
  Administered 2023-03-03 (×2): 20 mg via INTRAVENOUS
  Administered 2023-03-03: 50 mg via INTRAVENOUS

## 2023-03-03 MED ORDER — ONDANSETRON HCL 4 MG/2ML IJ SOLN
INTRAMUSCULAR | Status: DC | PRN
  Administered 2023-03-03: 4 mg via INTRAVENOUS

## 2023-03-03 MED ORDER — PHENYLEPHRINE 80 MCG/ML (10ML) SYRINGE FOR IV PUSH (FOR BLOOD PRESSURE SUPPORT)
PREFILLED_SYRINGE | INTRAVENOUS | Status: AC
  Filled 2023-03-03: qty 10

## 2023-03-03 MED ORDER — SODIUM CHLORIDE 0.9 % IV SOLN: INTRAVENOUS | Status: DC

## 2023-03-03 MED ORDER — INSULIN ASPART 100 UNIT/ML IJ SOLN: 0.0000 [IU] | INTRAMUSCULAR | Status: DC | PRN

## 2023-03-03 MED ORDER — CEFAZOLIN SODIUM-DEXTROSE 2-4 GM/100ML-% IV SOLN
INTRAVENOUS | Status: AC
  Filled 2023-03-03: qty 100

## 2023-03-03 MED ORDER — EPHEDRINE 5 MG/ML INJ
INTRAVENOUS | Status: AC
  Filled 2023-03-03: qty 10

## 2023-03-03 MED ORDER — CEFAZOLIN SODIUM-DEXTROSE 1-4 GM/50ML-% IV SOLN
1.0000 g | Freq: Once | INTRAVENOUS | Status: AC
Start: 2023-03-03 — End: 2023-03-03
  Administered 2023-03-03: 1 g via INTRAVENOUS
  Filled 2023-03-03: qty 50

## 2023-03-03 MED ORDER — PROSOURCE PLUS PO LIQD
30.0000 mL | Freq: Two times a day (BID) | ORAL | Status: DC
  Administered 2023-03-03 – 2023-03-11 (×14): 30 mL via ORAL
  Filled 2023-03-03 (×19): qty 30

## 2023-03-03 MED ORDER — CEFAZOLIN SODIUM-DEXTROSE 2-4 GM/100ML-% IV SOLN
2.0000 g | INTRAVENOUS | Status: AC
  Administered 2023-03-03: 2 g via INTRAVENOUS

## 2023-03-03 MED ORDER — DEXAMETHASONE SODIUM PHOSPHATE 10 MG/ML IJ SOLN
INTRAMUSCULAR | Status: AC
  Filled 2023-03-03: qty 1

## 2023-03-03 MED ORDER — SODIUM CHLORIDE 0.9 % IR SOLN
Status: DC | PRN
  Administered 2023-03-03: 1000 mL

## 2023-03-03 MED ORDER — PENTAFLUOROPROP-TETRAFLUOROETH EX AERO: 1.0000 | INHALATION_SPRAY | CUTANEOUS | Status: DC | PRN

## 2023-03-03 MED ORDER — EPHEDRINE 5 MG/ML INJ
INTRAVENOUS | Status: AC
  Filled 2023-03-03: qty 5

## 2023-03-03 MED ORDER — 0.9 % SODIUM CHLORIDE (POUR BTL) OPTIME
TOPICAL | Status: DC | PRN
  Administered 2023-03-03: 1000 mL

## 2023-03-03 MED ORDER — CHLORHEXIDINE GLUCONATE 4 % EX SOLN
60.0000 mL | Freq: Once | CUTANEOUS | Status: DC
Start: 2023-03-03 — End: 2023-03-03

## 2023-03-03 MED ORDER — CHLORHEXIDINE GLUCONATE 0.12 % MT SOLN
OROMUCOSAL | Status: AC
  Administered 2023-03-03: 15 mL via OROMUCOSAL
  Filled 2023-03-03: qty 15

## 2023-03-03 MED ORDER — LIDOCAINE HCL (PF) 1 % IJ SOLN: 5.0000 mL | INTRAMUSCULAR | Status: DC | PRN

## 2023-03-03 MED ORDER — ANTICOAGULANT SODIUM CITRATE 4% (200MG/5ML) IV SOLN: 5.0000 mL | Status: DC | PRN

## 2023-03-03 MED ORDER — ALTEPLASE 2 MG IJ SOLR: 2.0000 mg | Freq: Once | INTRAMUSCULAR | Status: DC | PRN

## 2023-03-03 SURGICAL SUPPLY — 47 items
BAG COUNTER SPONGE SURGICOUNT (BAG) ×2 IMPLANT
BLADE SAW SGTL 73X25 THK (BLADE) ×2 IMPLANT
BRUSH SCRUB EZ PLAIN DRY (MISCELLANEOUS) ×4 IMPLANT
COVER SURGICAL LIGHT HANDLE (MISCELLANEOUS) ×2 IMPLANT
DRAPE INCISE IOBAN 85X60 (DRAPES) ×2 IMPLANT
DRAPE SURG ORHT 6 SPLT 77X108 (DRAPES) ×4 IMPLANT
DRAPE U-SHAPE 47X51 STRL (DRAPES) ×2 IMPLANT
DRSG MEPILEX POST OP 4X12 (GAUZE/BANDAGES/DRESSINGS) IMPLANT
DRSG MEPILEX POST OP 4X8 (GAUZE/BANDAGES/DRESSINGS) ×2 IMPLANT
ELECT BLADE 6.5 EXT (BLADE) IMPLANT
ELECT CAUTERY BLADE 6.4 (BLADE) IMPLANT
ELECT REM PT RETURN 9FT ADLT (ELECTROSURGICAL) ×1 IMPLANT
ELECTRODE REM PT RTRN 9FT ADLT (ELECTROSURGICAL) ×2 IMPLANT
GLOVE BIO SURGEON STRL SZ7.5 (GLOVE) ×2 IMPLANT
GLOVE BIO SURGEON STRL SZ8 (GLOVE) ×2 IMPLANT
GLOVE BIOGEL PI IND STRL 8 (GLOVE) ×4 IMPLANT
GLOVE SURG ORTHO LTX SZ7.5 (GLOVE) ×4 IMPLANT
GOWN STRL REUS W/ TWL LRG LVL3 (GOWN DISPOSABLE) ×2 IMPLANT
GOWN STRL REUS W/ TWL XL LVL3 (GOWN DISPOSABLE) ×2 IMPLANT
HEAD FEM UNIPOLAR 45 OD STRL (Hips) IMPLANT
IMMOBILIZER KNEE 24 THIGH 36 (MISCELLANEOUS) IMPLANT
IMMOBILIZER KNEE 24 UNIV (MISCELLANEOUS) ×1 IMPLANT
KIT BASIN OR (CUSTOM PROCEDURE TRAY) ×2 IMPLANT
KIT TURNOVER KIT B (KITS) ×2 IMPLANT
MANIFOLD NEPTUNE II (INSTRUMENTS) ×2 IMPLANT
NDL 1/2 CIR MAYO (NEEDLE) IMPLANT
NDL SUT .5 MAYO 1.404X.05X (NEEDLE) IMPLANT
NEEDLE 1/2 CIR MAYO (NEEDLE) IMPLANT
NS IRRIG 1000ML POUR BTL (IV SOLUTION) ×2 IMPLANT
PACK TOTAL JOINT (CUSTOM PROCEDURE TRAY) ×2 IMPLANT
PAD ARMBOARD 7.5X6 YLW CONV (MISCELLANEOUS) ×4 IMPLANT
PILLOW ABDUCTION MEDIUM (MISCELLANEOUS) IMPLANT
RETRIEVER SUT HEWSON (MISCELLANEOUS) ×2 IMPLANT
SET HNDPC FAN SPRY TIP SCT (DISPOSABLE) IMPLANT
SPACER FEM TAPERED +0 12/14 (Hips) IMPLANT
STAPLER VISISTAT 35W (STAPLE) ×2 IMPLANT
STEM SUMMIT PRESSFIT SZ 6 (Hips) IMPLANT
SUT ETHILON 2 0 PSLX (SUTURE) ×4 IMPLANT
SUT FIBERWIRE #2 38 T-5 BLUE (SUTURE) ×2 IMPLANT
SUT VIC AB 0 CT1 36 (SUTURE) IMPLANT
SUT VIC AB 1 CT1 18XCR BRD 8 (SUTURE) ×2 IMPLANT
SUT VIC AB 1 CT1 27XBRD ANBCTR (SUTURE) ×4 IMPLANT
SUT VIC AB 2-0 CT1 TAPERPNT 27 (SUTURE) ×4 IMPLANT
SUTURE FIBERWR #2 38 T-5 BLUE (SUTURE) ×4 IMPLANT
TOWEL GREEN STERILE (TOWEL DISPOSABLE) ×2 IMPLANT
TOWEL GREEN STERILE FF (TOWEL DISPOSABLE) ×2 IMPLANT
WATER STERILE IRR 1000ML POUR (IV SOLUTION) ×2 IMPLANT

## 2023-03-03 NOTE — Transfer of Care (Signed)
 Immediate Anesthesia Transfer of Care Note  Patient: Stephanie Woodward  Procedure(s) Performed: ARTHROPLASTY BIPOLAR HIP (HEMIARTHROPLASTY) POSTERIOR (Right: Hip)  Patient Location: PACU  Anesthesia Type:General  Level of Consciousness: awake and drowsy  Airway & Oxygen Therapy: Patient Spontanous Breathing and Patient connected to face mask oxygen  Post-op Assessment: Report given to RN and Post -op Vital signs reviewed and stable  Post vital signs: Reviewed and stable  Last Vitals:  Vitals Value Taken Time  BP 143/35 03/03/23 1130  Temp 36.9 C 03/03/23 1130  Pulse 56 03/03/23 1141  Resp 16 03/03/23 1141  SpO2 92 % 03/03/23 1141  Vitals shown include unfiled device data.  Last Pain:  Vitals:   03/03/23 1130  TempSrc:   PainSc: Asleep         Complications: No notable events documented.

## 2023-03-03 NOTE — Progress Notes (Signed)
Pt. Off the unit to OR.

## 2023-03-03 NOTE — Progress Notes (Signed)
 Transition of Care [facility]) - CAGE-AID Screening   Patient Details  Name: Stephanie Woodward MRN: [id number] Date of Birth: 11/09/1940  Transition of [facility]) CM/SW Contact:    Sallyanne MALVA Mettle, RN Phone Number: 03/03/2023, 7:19 PM    CAGE-AID Screening:    Have You Ever Felt You Ought to Cut Down on Your Drinking or Drug Use?: No Have People Annoyed You By Critizing Your Drinking Or Drug Use?: No Have You Felt Bad Or Guilty About Your Drinking Or Drug Use?: No Have You Ever Had a Drink or Used Drugs First Thing In The Morning to Steady Your Nerves or to Get Rid of a Hangover?: No CAGE-AID Score: 0  Substance Abuse Education Offered: No

## 2023-03-03 NOTE — Progress Notes (Signed)
   03/03/23 0153  Vitals  Temp 98.8 F (37.1 C)  Temp Source Oral  BP (!) 104/38  MAP (mmHg) (!) 54  BP Location Left Arm  BP Method Automatic  Patient Position (if appropriate) Lying  Pulse Rate 64  Pulse Rate Source Monitor  ECG Heart Rate 68  Resp (!) 21  During Treatment Monitoring  Cumulative Fluid Removed (mL) per Treatment  1000  HD Safety Checks Performed Yes  Intra-Hemodialysis Comments Tx completed  Post Treatment  Dialyzer Clearance Lightly streaked  Liters Processed 57.2  Fluid Removed (mL) 1000 mL  AVG/AVF Arterial Site Held (minutes) 10 minutes  AVG/AVF Venous Site Held (minutes) 10 minutes   Pt stable

## 2023-03-03 NOTE — Anesthesia Preprocedure Evaluation (Signed)
 Anesthesia Evaluation  Patient identified by MRN, date of birth, ID band Patient awake    Reviewed: Allergy & Precautions, H&P , NPO status , Patient's Chart, lab work & pertinent test results  Airway Mallampati: II   Neck ROM: full    Dental   Pulmonary asthma , sleep apnea , COPD, former smoker   breath sounds clear to auscultation       Cardiovascular hypertension, + CAD and +CHF   Rhythm:regular Rate:Normal     Neuro/Psych  PSYCHIATRIC DISORDERS     Dementia    GI/Hepatic ,GERD  ,,  Endo/Other  diabetes, Type 2    Renal/GU ESRF and DialysisRenal disease     Musculoskeletal  (+) Arthritis ,    Abdominal   Peds  Hematology   Anesthesia Other Findings   Reproductive/Obstetrics                             Anesthesia Physical Anesthesia Plan  ASA: 4  Anesthesia Plan: General   Post-op Pain Management:    Induction: Intravenous  PONV Risk Score and Plan: 3 and Dexamethasone , Treatment may vary due to age or medical condition and Ondansetron   Airway Management Planned: Oral ETT  Additional Equipment:   Intra-op Plan:   Post-operative Plan: Extubation in OR  Informed Consent: I have reviewed the patients History and Physical, chart, labs and discussed the procedure including the risks, benefits and alternatives for the proposed anesthesia with the patient or authorized representative who has indicated his/her understanding and acceptance.     Dental advisory given  Plan Discussed with: Anesthesiologist, CRNA and Surgeon  Anesthesia Plan Comments:        Anesthesia Quick Evaluation

## 2023-03-03 NOTE — Op Note (Signed)
 03/03/2023  PATIENT:  Stephanie Woodward  06-02-1940 female   MEDICAL RECORD NUMBER: [id number]  PRE-OPERATIVE DIAGNOSIS:  DISPLACED RIGHT FEMORAL NECK FRACTURE  POST-OPERATIVE DIAGNOSIS:  DISPLACED RIGHT FEMORAL NECK FRACTURE  PROCEDURE:  Procedure(s): UNIPOLAR HEMIARTHROPLASTY OF THE RIGHT HIP with DePuy [place] Basic #6 femoral stem, standard neck, 45 mm head  SURGEON:  Surgeon(s) and Role:    DEWAINE Celena Sharper, MD - Primary  PHYSICIAN ASSISTANT: 1. APRIL GREEN RNFA; FRANCIS MT, PA-C  ANESTHESIA:   general  EBL:  250 mL   BLOOD ADMINISTERED: none  DRAINS: none   LOCAL MEDICATIONS USED:  NONE  SPECIMEN:  No Specimen  DISPOSITION OF SPECIMEN:  N/A  COUNTS:  YES  TOURNIQUET:  * No tourniquets in log *  DICTATION: Note written in EPIC  PLAN OF CARE: Admit to inpatient   PATIENT DISPOSITION:  PACU - hemodynamically stable.   Delay start of Pharmacological VTE agent (>24hrs) due to surgical blood loss or risk of bleeding: no  BRIEF SUMMARY OF INDICATION FOR PROCEDURE:  Stephanie Woodward is a very pleasant 83 y.o. with who sustained a fall producing inability to bear weight.  She was seen and evaluated with the recommendation for hemiarthroplasty. I discussed with the patient and family the risks and benefits, inclding the potential for leg length inequality, dislocation or instability, arthritis, loss of motion, DVT, PE, heart attack, stroke, and death.  Consent was given to proceed.  BRIEF SUMMARY OF PROCEDURE:  The patient was taken to the operating room where general anesthesia was induced and after administration of preoperative antibiotics consisting of 2 g of Ancef .  She was positioned with the right side up and all prominences were padded appropriately.  We made a 16 cm incision after the time-out, carrying dissection down to the IT band, was split in line with the skin.  Cerebellar retractor was placed and we were able to then flex and internally rotate the  hip releasing the piriformis and short rotators at their insertions.  The capsule was then T'd, tagging the corners with #1 Vicryl.  The neck cut was refined using a cutting guide and then this was followed by removal of the head, which sized perfectly to 45 mm. Acetabular trials were placed, confirming this size as the best fit. Mueller and Cobra retractors were placed along the proximal femur, which was then prepared with the canal finder,  then lateralizer, followed by reamers up to #6, and the broaches, achieving  outstanding fit and fill with the #6 broach.  The calcar reamer was used to refine the cut as we were using a low demand stem.  The canal was irrigated thoroughly and the acetabulum once again searched multiple times for fragments and irrigated thoroughly.  Trial components were placed and the patient had outstanding stability in combine 90 degrees of flexion, adduction, and internal rotation as well as in external rotation and extension.  Consequently, actual components were placed.  My assistant Francis Mt, was necessary for delivery and control of the proximal femur during preparation, also during relocation and dislocation of the trial components as well as relocation of the actual components.  He assisted me with wound closure as well.  I did repair the capsule with #1 Vicryl and then used #2 FiberWire through bone tunnels to repair the short rotators and piriformis.  This was followed by a #1 Vicryl for the IT band and lastly 2-0 Vicryl and nylon for the subcutaneous and skin.  Sterile gently compressive dressing was applied.  The patient was awakened from anesthesia and transported to the PACU in stable condition.  PROGNOSIS:  The patient will be weightbearing as tolerated with posterior hip precautions.  Patient has an elevated risk of complications related to declining overall health and mobility.  Stephanie Woodward remains on the Medical Service and will be on DVT  prophylaxis mechanically and with Lovenox  while in the hospital, and will resume Eliquis  which will provide long-term prophylaxis.     Ozell DEL. Celena, M.D.

## 2023-03-03 NOTE — Plan of Care (Signed)
  Problem: Education: Goal: Ability to describe self-care measures that may prevent or decrease complications (Diabetes Survival Skills Education) will improve Outcome: Progressing Goal: Individualized Educational Video(s) Outcome: Progressing   Problem: Coping: Goal: Ability to adjust to condition or change in health will improve Outcome: Progressing   Problem: Fluid Volume: Goal: Ability to maintain a balanced intake and output will improve Outcome: Progressing   Problem: Health Behavior/Discharge Planning: Goal: Ability to identify and utilize available resources and services will improve Outcome: Progressing Goal: Ability to manage health-related needs will improve Outcome: Progressing   Problem: Metabolic: Goal: Ability to maintain appropriate glucose levels will improve Outcome: Progressing   Problem: Nutritional: Goal: Maintenance of adequate nutrition will improve Outcome: Progressing Goal: Progress toward achieving an optimal weight will improve Outcome: Progressing   Problem: Skin Integrity: Goal: Risk for impaired skin integrity will decrease Outcome: Progressing   Problem: Tissue Perfusion: Goal: Adequacy of tissue perfusion will improve Outcome: Progressing   Problem: Education: Goal: Knowledge of General Education information will improve Description: Including pain rating scale, medication(s)/side effects and non-pharmacologic comfort measures Outcome: Progressing   Problem: Health Behavior/Discharge Planning: Goal: Ability to manage health-related needs will improve Outcome: Progressing   Problem: Clinical Measurements: Goal: Ability to maintain clinical measurements within normal limits will improve Outcome: Progressing Goal: Will remain free from infection Outcome: Progressing Goal: Diagnostic test results will improve Outcome: Progressing Goal: Respiratory complications will improve Outcome: Progressing Goal: Cardiovascular complication will  be avoided Outcome: Progressing   Problem: Activity: Goal: Risk for activity intolerance will decrease Outcome: Progressing   Problem: Nutrition: Goal: Adequate nutrition will be maintained Outcome: Progressing   Problem: Coping: Goal: Level of anxiety will decrease Outcome: Progressing   Problem: Elimination: Goal: Will not experience complications related to bowel motility Outcome: Progressing Goal: Will not experience complications related to urinary retention Outcome: Progressing   Problem: Pain Management: Goal: General experience of comfort will improve Outcome: Progressing   Problem: Safety: Goal: Ability to remain free from injury will improve Outcome: Progressing   Problem: Skin Integrity: Goal: Risk for impaired skin integrity will decrease Outcome: Progressing   Problem: Education: Goal: Verbalization of understanding the information provided (i.e., activity precautions, restrictions, etc) will improve Outcome: Progressing Goal: Individualized Educational Video(s) Outcome: Progressing   Problem: Activity: Goal: Ability to ambulate and perform ADLs will improve Outcome: Progressing   Problem: Clinical Measurements: Goal: Postoperative complications will be avoided or minimized Outcome: Progressing   Problem: Self-Concept: Goal: Ability to maintain and perform role responsibilities to the fullest extent possible will improve Outcome: Progressing   Problem: Pain Management: Goal: Pain level will decrease Outcome: Progressing

## 2023-03-03 NOTE — Progress Notes (Signed)
 PROGRESS NOTE  Stephanie Woodward  DOB: March 18, 1940  PCP: [facility] FMW:[id number]  DOA: 03/02/2023  LOS: 1 day  Hospital Day: 2  Brief narrative: Stephanie Woodward is a 83 y.o. female with PMH significant for ESRD HD MWF, dementia with behavioral disturbance, DM2, HTN, HLD, CHF, A-fib on Eliquis , CKD, COPD, obesity, sleep apnea 9/8, patient was brought to the ED from home after a fall..  Patient was apparently trying to roll over in bed when she slipped out falling to the floor on her buttocks.  She was on the floor for 20 minutes until her daughter arrived.  Complained of right hip pain.  EMS was called and patient was brought to the ED. At baseline,, she uses a wheelchair for mobility.  Initial labs with WBC count 12.2 X-ray showed closed displaced fracture of right femoral neck Admitted to [facility] Dr. Sharl was consulted for surgical fixation  Subjective: Patient was seen and examined this afternoon. Lying on bed.  Just back from surgery Daughter at bedside.  Pain controlled.  Assessment and plan: Closed displaced fracture of right femoral neck Secondary to a fall from bed Imaging as above Plan for surgical fixation Thursday versus Friday Eliquis  on hold Pain control with scheduled Tylenol , as needed Norco, as needed IV Dilaudid  Bowel regimen. DVT prophylaxis -SCDs for now PT OT eval after procedure   ESRD on hemodialysis Nephro consulted.  Chronic combined systolic and diastolic CHF  Hypotension Most recent echo from March 2024 with EF 55 to 60% and grade 2 diastolic dysfunction  Because of chronic hypertension, patient is on midodrine  before dialysis on MWF. Continue the same  Type 2 diabetes mellitus A1c 7.9 on 05/19/2022 PTA meds- Lantus  20 units in a.m. Currently on SSI/Accu-Cheks.  Blood sugar trend as below.  May need to resume Lantus  from tomorrow. Recent Labs  Lab 03/03/23 0358 03/03/23 0701 03/03/23 0819  03/03/23 1059 03/03/23 1136  GLUCAP 191* 156* 143* 154* 163*   Dementia without behavioral disturbance  Chronic, stable, baseline   Paroxysmal atrial fibrillation Cont amiodarone  Eliquis  on hold for surgery  GERD PPI   Mobility: Needs PT eval postprocedure  Goals of care   Code Status: Full Code     DVT prophylaxis:  SCDs Start: 03/03/23 1402 Place and maintain sequential compression device Start: 03/02/23 1443   Antimicrobials: None Fluid: None Consultants: Orthopedics Family Communication: Daughter at bedside  Status: Inpatient Level of care:  Med-Surg   Patient is from: Home Needs to continue in-hospital care: POD 0 Anticipated d/c to: Pending clinical course      Diet:  Diet Order             Diet renal with fluid restriction Fluid restriction: 1200 mL Fluid; Room service appropriate? Yes; Fluid consistency: Thin  Diet effective now                   Scheduled Meds:  acetaminophen   1,000 mg Oral Once   amiodarone   200 mg Oral Daily   atorvastatin   80 mg Oral QHS   Chlorhexidine  Gluconate Cloth  6 each Topical Q0600   [START ON 03/04/2023] Chlorhexidine  Gluconate Cloth  6 each Topical Q0600   docusate sodium   100 mg Oral BID   fentaNYL        insulin  aspart  0-6 Units Subcutaneous Q4H   multivitamin  1 tablet Oral QHS   pantoprazole   40 mg Oral BID    PRN meds: alteplase , anticoagulant sodium citrate ,  feeding supplement (NEPRO CARB STEADY), fentaNYL , heparin , HYDROcodone -acetaminophen , HYDROmorphone  (DILAUDID ) injection, lidocaine  (PF), lidocaine -prilocaine , menthol -cetylpyridinium **OR** phenol, metoCLOPramide  **OR** metoCLOPramide  (REGLAN ) injection, midodrine , pentafluoroprop-tetrafluoroeth   Infusions:   anticoagulant sodium citrate       ceFAZolin  (ANCEF ) IV     tranexamic acid       Antimicrobials: Anti-infectives (From admission, onward)    Start     Dose/Rate Route Frequency Ordered Stop   03/03/23 1500  ceFAZolin  (ANCEF ) IVPB  2g/100 mL premix        2 g 200 mL/hr over 30 Minutes Intravenous Every 6 hours 03/03/23 1401 03/04/23 0259   03/03/23 0730  ceFAZolin  (ANCEF ) IVPB 2g/100 mL premix        2 g 200 mL/hr over 30 Minutes Intravenous On call to O.R. 03/03/23 0704 03/03/23 0846   03/03/23 0712  ceFAZolin  (ANCEF ) 2-4 GM/100ML-% IVPB       Note to Pharmacy: Marvin Domino E: cabinet override      03/03/23 0712 03/03/23 0914       Objective: Vitals:   03/03/23 1217 03/03/23 1252  BP:  132/61  Pulse: 60 (!) 56  Resp: 16 16  Temp: 98.8 F (37.1 C) 98.2 F (36.8 C)  SpO2: 92% 100%    Intake/Output Summary (Last 24 hours) at 03/03/2023 1403 Last data filed at 03/03/2023 1217 Gross per 24 hour  Intake 1690 ml  Output 1250 ml  Net 440 ml   Filed Weights   03/02/23 0139 03/03/23 0656  Weight: 76 kg 76 kg   Weight change: 0 kg Body mass index is 30.65 kg/m.   Physical Exam: General exam: Pleasant, elderly.  Not in distress. Skin: No rashes, lesions or ulcers. HEENT: Atraumatic, normocephalic, no obvious bleeding Lungs: Clear to auscultation bilaterally,  CVS: S1, S2, no murmur,   GI/Abd: Soft, nontender, nondistended, bowel sound present,   CNS: Alert, awake, able to answer orientation questions Psychiatry: Mood appropriate,  Extremities: No pedal edema, no calf tenderness  Data Review: I have personally reviewed the laboratory data and studies available.  F/u labs collected Unresulted Labs (From admission, onward)     Start     Ordered   03/04/23 0500  CBC  Daily,   R     Comments: For 3 days.   Question:  Specimen collection method  Answer:  Lab=Lab collect   03/03/23 1401   03/04/23 0500  Basic metabolic panel  Daily,   R     Comments: For 2 days .   Question:  Specimen collection method  Answer:  Lab=Lab collect   03/03/23 1401   03/04/23 0500  CBC  Tomorrow morning,   R       Question:  Specimen collection method  Answer:  Lab=Lab collect   03/03/23 1247            Total  time spent in review of labs and imaging, patient evaluation, formulation of plan, documentation and communication with family: 45 minutes  Signed, Chapman Rota, MD [facility] 03/03/2023

## 2023-03-03 NOTE — Progress Notes (Addendum)
 The risks and benefits of right hip hemiarthroplasty for treatment of her osteoporotic right femoral neck fracture were discussed with the patient, including the possibility of infection, nerve injury, vessel injury, wound breakdown, arthritis, symptomatic hardware, DVT/ PE, loss of motion, fracture, limb length inequality, and need for further surgery among others.   These risks were acknowledged and consent was provided to proceed.  Ozell Bruch, MD [facility], [facility] [phone]

## 2023-03-03 NOTE — Progress Notes (Signed)
 [facility] Progress Note   Subjective:   Patient was seen in her room postop from her right hip hemiarthroplasty. She was in bed, sleepy from anesthesia, but alert. Her daughter was in the room. She denies any chest pain, SOB, or dyspnea, but she was on 3L [place].   I explained that she would be having a fistulagram tomorrow morning due to her prolonged bleeding. I answered all questions that she and her daughter had.   Objective Vitals:   03/03/23 1200 03/03/23 1216 03/03/23 1217 03/03/23 1252  BP: (!) 130/36 (!) 124/29  132/61  Pulse: (!) 57 (!) 57 60 (!) 56  Resp: 15 16 16 16   Temp:   98.8 F (37.1 C) 98.2 F (36.8 C)  TempSrc:    Oral  SpO2: 91% 91% 92% 100%  Weight:      Height:       Physical Exam General: Pleasant elderly woman, sleepy, alert Heart:RRR, no murmur or bruit Lungs: CTA, 3L O2 [place] Abdomen: soft, non-tender, non-distended, + BS Extremities: no UE or LE edema seen, right  Dialysis Access: LUA AVF +B/T  Additional Objective Labs: Basic Metabolic Panel: Recent Labs  Lab 03/02/23 0234 03/03/23 0740  NA 138 135  K 4.0 4.4  CL 96* 95*  CO2 26  --   GLUCOSE 71 158*  BUN 29* 21  CREATININE 6.94* 5.30*  CALCIUM  8.9  --   PHOS 4.9*  --    Liver Function Tests: Recent Labs  Lab 03/02/23 0234  ALBUMIN  3.2*   CBC: Recent Labs  Lab 03/02/23 0234 03/03/23 0740  WBC 12.2*  --   NEUTROABS 10.1*  --   HGB 12.3 12.9  HCT 38.5 38.0  MCV 100.0  --   PLT 197  --     CBG: Recent Labs  Lab 03/03/23 0358 03/03/23 0701 03/03/23 0819 03/03/23 1059 03/03/23 1136  GLUCAP 191* 156* 143* 154* 163*    Medications:  anticoagulant sodium citrate       ceFAZolin  (ANCEF ) IV     tranexamic acid       acetaminophen   1,000 mg Oral Once   amiodarone   200 mg Oral Daily   atorvastatin   80 mg Oral QHS   Chlorhexidine  Gluconate Cloth  6 each Topical Q0600   [START ON 03/04/2023] Chlorhexidine  Gluconate Cloth  6 each Topical Q0600   docusate  sodium  100 mg Oral BID   fentaNYL        insulin  aspart  0-6 Units Subcutaneous Q4H   multivitamin  1 tablet Oral QHS   pantoprazole   40 mg Oral BID    Dialysis Orders [facility] MWF  4h   350/600   77.3kg   2/2 bath   AVF LUA  Heparin  none - hectorol  9 mcg  - last Hb 12.0   Assessment/Plan: Right hip fracture - fall at home, unipolar hemiarthroplasty of right hip 1/9,  ESRD: Plan for HD 1/10 post fistulagram HTN/volume: no UE or LE edema on exam, under dry weight, pre-HD midodrine  Anemia of ESRD: Hgb stable 12.9. No ESA Secondary HPTH: Corrected Ca and phos good. Not on binder. Hectorol  9 mcg.  Nutrition: albumin  3.2. NPO after midnight for fistulagram tomorrow, ordered supplements.  Dementia- chronic, stable, baseline DM- continue with lantus  PAF - Eliquis  on hold post-op, continue amiodarone  Combined diastolic/ systolic CHF - EF at 55-60%, still G3DD Prolonged bleeding of fistula: Scheduled for fistulagram 1/10 with Dr. Melia, eliquis  on hold, no heparin  in HD   [facility], [place]  03/03/2023, 2:03 PM  [facility]

## 2023-03-03 NOTE — Progress Notes (Signed)
 S/p HEMIARTHROPLASTY OF THE RIGHT HIP    03/03/23 1317  TOC Brief Assessment  Insurance and Status Reviewed ([facility])  Patient has primary care physician Yes  Home environment has been reviewed From home alone. Suportive daughters.  Prior level of function: PTA independent with ADL's. W/C bound.  Prior/Current Home Services No current home services  Social Drivers of Health Review SDOH reviewed no interventions necessary  Readmission risk has been reviewed No  Transition of care needs transition of care needs identified, TOC will continue to follow   [facility] with [facility] [phone].  PT/OT  evaluations pending...  TOC team following and will assist with needs. Jon Hoit RN,BSN,CM

## 2023-03-03 NOTE — Anesthesia Procedure Notes (Signed)
 Procedure Name: Intubation Date/Time: 03/03/2023 8:41 AM  Performed by: Mannie Krystal LABOR, CRNAPre-anesthesia Checklist: Patient identified, Emergency Drugs available, Suction available and Patient being monitored Patient Re-evaluated:Patient Re-evaluated prior to induction Oxygen Delivery Method: Circle system utilized Preoxygenation: Pre-oxygenation with 100% oxygen Induction Type: IV induction Ventilation: Mask ventilation without difficulty Laryngoscope Size: Miller and 2 Grade View: Grade I Tube type: Oral Tube size: 7.0 mm Number of attempts: 1 Airway Equipment and Method: Stylet and Oral airway Placement Confirmation: ETT inserted through vocal cords under direct vision, positive ETCO2 and breath sounds checked- equal and bilateral Tube secured with: Tape Dental Injury: Teeth and Oropharynx as per pre-operative assessment

## 2023-03-04 ENCOUNTER — Encounter (HOSPITAL_COMMUNITY): Payer: Self-pay | Admitting: Orthopedic Surgery

## 2023-03-04 ENCOUNTER — Encounter (HOSPITAL_COMMUNITY): Admission: EM | Disposition: E | Payer: Self-pay | Source: Home / Self Care | Attending: Internal Medicine

## 2023-03-04 ENCOUNTER — Ambulatory Visit (HOSPITAL_COMMUNITY): Admission: RE | Admit: 2023-03-04 | Payer: Medicare (Managed Care) | Source: Home / Self Care | Admitting: Nephrology

## 2023-03-04 DIAGNOSIS — S72001A Fracture of unspecified part of neck of right femur, initial encounter for closed fracture: Secondary | ICD-10-CM | POA: Diagnosis not present

## 2023-03-04 HISTORY — PX: A/V FISTULAGRAM: CATH118298

## 2023-03-04 LAB — CBC
HCT: 30.3 % — ABNORMAL LOW (ref 36.0–46.0)
Hemoglobin: 9.7 g/dL — ABNORMAL LOW (ref 12.0–15.0)
MCH: 31.7 pg (ref 26.0–34.0)
MCHC: 32 g/dL (ref 30.0–36.0)
MCV: 99 fL (ref 80.0–100.0)
Platelets: 174 10*3/uL (ref 150–400)
RBC: 3.06 MIL/uL — ABNORMAL LOW (ref 3.87–5.11)
RDW: 14.6 % (ref 11.5–15.5)
WBC: 14.2 10*3/uL — ABNORMAL HIGH (ref 4.0–10.5)
nRBC: 0 % (ref 0.0–0.2)

## 2023-03-04 LAB — BASIC METABOLIC PANEL
Anion gap: 13 (ref 5–15)
BUN: 38 mg/dL — ABNORMAL HIGH (ref 8–23)
CO2: 26 mmol/L (ref 22–32)
Calcium: 9.1 mg/dL (ref 8.9–10.3)
Chloride: 96 mmol/L — ABNORMAL LOW (ref 98–111)
Creatinine, Ser: 6.75 mg/dL — ABNORMAL HIGH (ref 0.44–1.00)
GFR, Estimated: 6 mL/min — ABNORMAL LOW (ref >60)
Glucose, Bld: 211 mg/dL — ABNORMAL HIGH (ref 70–99)
Potassium: 4.4 mmol/L (ref 3.5–5.1)
Sodium: 135 mmol/L (ref 135–145)

## 2023-03-04 LAB — GLUCOSE, CAPILLARY
Glucose-Capillary: 172 mg/dL — ABNORMAL HIGH (ref 70–99)
Glucose-Capillary: 217 mg/dL — ABNORMAL HIGH (ref 70–99)
Glucose-Capillary: 221 mg/dL — ABNORMAL HIGH (ref 70–99)
Glucose-Capillary: 236 mg/dL — ABNORMAL HIGH (ref 70–99)
Glucose-Capillary: 253 mg/dL — ABNORMAL HIGH (ref 70–99)
Glucose-Capillary: 459 mg/dL — ABNORMAL HIGH (ref 70–99)

## 2023-03-04 SURGERY — A/V FISTULAGRAM
Anesthesia: LOCAL

## 2023-03-04 MED ORDER — INSULIN ASPART 100 UNIT/ML IJ SOLN
0.0000 [IU] | Freq: Three times a day (TID) | INTRAMUSCULAR | Status: DC
  Administered 2023-03-05: 3 [IU] via SUBCUTANEOUS
  Administered 2023-03-05: 5 [IU] via SUBCUTANEOUS
  Administered 2023-03-05 – 2023-03-06 (×2): 3 [IU] via SUBCUTANEOUS
  Administered 2023-03-06: 2 [IU] via SUBCUTANEOUS
  Administered 2023-03-07 (×2): 1 [IU] via SUBCUTANEOUS
  Administered 2023-03-08: 5 [IU] via SUBCUTANEOUS
  Administered 2023-03-09: 3 [IU] via SUBCUTANEOUS
  Administered 2023-03-09: 1 [IU] via SUBCUTANEOUS
  Administered 2023-03-10: 2 [IU] via SUBCUTANEOUS
  Administered 2023-03-11: 1 [IU] via SUBCUTANEOUS

## 2023-03-04 MED ORDER — MIDAZOLAM HCL 2 MG/2ML IJ SOLN
INTRAMUSCULAR | Status: DC | PRN
  Administered 2023-03-04: .5 mg via INTRAVENOUS

## 2023-03-04 MED ORDER — HEPARIN SODIUM (PORCINE) 5000 UNIT/ML IJ SOLN
5000.0000 [IU] | Freq: Three times a day (TID) | INTRAMUSCULAR | Status: DC
  Administered 2023-03-04 – 2023-03-06 (×5): 5000 [IU] via SUBCUTANEOUS
  Filled 2023-03-04 (×5): qty 1

## 2023-03-04 MED ORDER — APIXABAN 2.5 MG PO TABS: 2.5000 mg | ORAL_TABLET | Freq: Two times a day (BID) | ORAL | Status: DC

## 2023-03-04 MED ORDER — MORPHINE SULFATE (PF) 2 MG/ML IV SOLN: 1.0000 mg | INTRAVENOUS | Status: DC | PRN

## 2023-03-04 MED ORDER — LIDOCAINE HCL (PF) 1 % IJ SOLN: 5.0000 mL | INTRAMUSCULAR | Status: DC | PRN

## 2023-03-04 MED ORDER — POLYETHYLENE GLYCOL 3350 17 G PO PACK: 17.0000 g | PACK | Freq: Every day | ORAL | Status: DC | PRN

## 2023-03-04 MED ORDER — INSULIN GLARGINE-YFGN 100 UNIT/ML ~~LOC~~ SOLN
10.0000 [IU] | Freq: Every day | SUBCUTANEOUS | Status: DC
Start: 2023-03-04 — End: 2023-03-05
  Administered 2023-03-04 – 2023-03-05 (×2): 10 [IU] via SUBCUTANEOUS
  Filled 2023-03-04 (×2): qty 0.1

## 2023-03-04 MED ORDER — LIDOCAINE HCL (PF) 1 % IJ SOLN
INTRAMUSCULAR | Status: AC
Start: 2023-03-04 — End: ?
  Filled 2023-03-04: qty 30

## 2023-03-04 MED ORDER — SENNOSIDES-DOCUSATE SODIUM 8.6-50 MG PO TABS
1.0000 | ORAL_TABLET | Freq: Every day | ORAL | Status: DC
  Administered 2023-03-04 – 2023-03-10 (×6): 1 via ORAL
  Filled 2023-03-04 (×8): qty 1

## 2023-03-04 MED ORDER — MIDAZOLAM HCL 2 MG/2ML IJ SOLN
INTRAMUSCULAR | Status: AC
Start: 2023-03-04 — End: ?
  Filled 2023-03-04: qty 2

## 2023-03-04 MED ORDER — LIDOCAINE-PRILOCAINE 2.5-2.5 % EX CREA: 1.0000 | TOPICAL_CREAM | CUTANEOUS | Status: DC | PRN

## 2023-03-04 MED ORDER — MIDODRINE HCL 5 MG PO TABS
10.0000 mg | ORAL_TABLET | Freq: Once | ORAL | Status: AC
  Administered 2023-03-04: 10 mg via ORAL
  Filled 2023-03-04: qty 2

## 2023-03-04 MED ORDER — HEPARIN SODIUM (PORCINE) 1000 UNIT/ML DIALYSIS: 1000.0000 [IU] | INTRAMUSCULAR | Status: DC | PRN

## 2023-03-04 MED ORDER — ACETAMINOPHEN 500 MG PO TABS
1000.0000 mg | ORAL_TABLET | Freq: Three times a day (TID) | ORAL | Status: DC
  Administered 2023-03-04 – 2023-03-09 (×17): 1000 mg via ORAL
  Filled 2023-03-04 (×19): qty 2

## 2023-03-04 MED ORDER — INSULIN ASPART 100 UNIT/ML IJ SOLN
7.0000 [IU] | Freq: Once | INTRAMUSCULAR | Status: AC
  Administered 2023-03-04: 7 [IU] via SUBCUTANEOUS

## 2023-03-04 MED ORDER — ANTICOAGULANT SODIUM CITRATE 4% (200MG/5ML) IV SOLN: 5.0000 mL | Status: DC | PRN

## 2023-03-04 MED ORDER — INSULIN ASPART 100 UNIT/ML IJ SOLN
0.0000 [IU] | Freq: Every day | INTRAMUSCULAR | Status: DC
  Administered 2023-03-05 – 2023-03-10 (×3): 2 [IU] via SUBCUTANEOUS

## 2023-03-04 MED ORDER — DARBEPOETIN ALFA 60 MCG/0.3ML IJ SOSY
60.0000 ug | PREFILLED_SYRINGE | INTRAMUSCULAR | Status: DC
  Administered 2023-03-04 – 2023-03-11 (×2): 60 ug via SUBCUTANEOUS
  Filled 2023-03-04 (×2): qty 0.3

## 2023-03-04 MED ORDER — HEPARIN (PORCINE) IN NACL 1000-0.9 UT/500ML-% IV SOLN
INTRAVENOUS | Status: DC | PRN
  Administered 2023-03-04: 500 mL

## 2023-03-04 MED ORDER — LIDOCAINE HCL (PF) 1 % IJ SOLN
INTRAMUSCULAR | Status: DC | PRN
  Administered 2023-03-04: 2 mL

## 2023-03-04 MED ORDER — OXYCODONE HCL 5 MG PO TABS: 5.0000 mg | ORAL_TABLET | Freq: Four times a day (QID) | ORAL | Status: DC | PRN

## 2023-03-04 MED ORDER — PENTAFLUOROPROP-TETRAFLUOROETH EX AERO: 1.0000 | INHALATION_SPRAY | CUTANEOUS | Status: DC | PRN

## 2023-03-04 MED ORDER — FENTANYL CITRATE (PF) 100 MCG/2ML IJ SOLN
INTRAMUSCULAR | Status: AC
  Filled 2023-03-04: qty 2

## 2023-03-04 MED ORDER — FENTANYL CITRATE (PF) 100 MCG/2ML IJ SOLN
INTRAMUSCULAR | Status: DC | PRN
  Administered 2023-03-04: 25 ug via INTRAVENOUS

## 2023-03-04 MED ORDER — ALBUMIN HUMAN 5 % IV SOLN
12.5000 g | Freq: Four times a day (QID) | INTRAVENOUS | Status: AC
  Administered 2023-03-04 – 2023-03-05 (×2): 12.5 g via INTRAVENOUS
  Filled 2023-03-04 (×2): qty 250

## 2023-03-04 MED ORDER — SODIUM CHLORIDE 0.9 % IV BOLUS
250.0000 mL | Freq: Once | INTRAVENOUS | Status: DC
Start: 2023-03-04 — End: 2023-03-04

## 2023-03-04 MED ORDER — IODIXANOL 320 MG/ML IV SOLN
INTRAVENOUS | Status: DC | PRN
  Administered 2023-03-04: 10 mL

## 2023-03-04 SURGICAL SUPPLY — 5 items
BAG SNAP BAND KOVER 36X36 (MISCELLANEOUS) ×2 IMPLANT
COVER DOME SNAP 22 D (MISCELLANEOUS) ×2 IMPLANT
SHEATH PINNACLE R/O II 6F 4CM (SHEATH) IMPLANT
TRAY PV CATH (CUSTOM PROCEDURE TRAY) ×2 IMPLANT
WIRE STARTER BENTSON 035X150 (WIRE) IMPLANT

## 2023-03-04 NOTE — Procedures (Signed)
 Received patient in bed to unit.  Alert and oriented.  Informed consent signed and in chart.   TX duration: 3 hours  Patient tolerated well.  Transported back to the room  Alert, without acute distress.  Hand-off given to patient's nurse.   Access used: lavf Access issues: none  Total UF removed: 0.5 liters Greig Silvan, RN [facility]

## 2023-03-04 NOTE — Progress Notes (Signed)
 [facility] Progress Note  Patient ID: Stephanie Woodward MRN: [id number] DOB/AGE: 1940/06/30 83 y.o.  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 She does have 2 caregivers that assist her M-F during the day hours   ROS As above  Objective:   VITALS:   Vitals:   03/04/23 0030 03/04/23 0300 03/04/23 0630 03/04/23 0748  BP: 123/69 113/64 (!) 120/47   Pulse: 76 77 65   Resp:      Temp: 98.2 F (36.8 C) 98.2 F (36.8 C) 99.2 F (37.3 C)   TempSrc: Oral Oral Oral   SpO2: 97% 98% 100% 90%  Weight:      Height:        Estimated body mass index is 30.65 kg/m as calculated from the following:   Height as of this encounter: 5' 2 (1.575 m).   Weight as of this encounter: 76 kg.   Intake/Output      01/09 0701 01/10 0700 01/10 0701 01/11 0700   P.O. 477 240   I.V. (mL/kg) 610 (8)    IV Piggyback 600    Total Intake(mL/kg) 1687 (22.2) 240 (3.2)   Urine (mL/kg/hr) 150 (0.1)    Other     Blood 250    Total Output 400    Net +1287 +240          LABS  Results for orders placed or performed during the hospital encounter of 03/02/23 (from the past 24 hours)  Glucose, capillary     Status: Abnormal   Collection Time: 03/03/23 10:59 AM  Result Value Ref Range   Glucose-Capillary 154 (H) 70 - 99 mg/dL  Glucose, capillary     Status: Abnormal   Collection Time: 03/03/23 11:36 AM  Result Value Ref Range   Glucose-Capillary 163 (H) 70 - 99 mg/dL  Glucose, capillary     Status: Abnormal   Collection Time: 03/03/23  2:11 PM  Result Value Ref Range   Glucose-Capillary 186 (H) 70 - 99 mg/dL  Glucose, capillary     Status: Abnormal   Collection Time: 03/03/23  4:29 PM  Result Value  Ref Range   Glucose-Capillary 223 (H) 70 - 99 mg/dL  Glucose, capillary     Status: Abnormal   Collection Time: 03/03/23  8:15 PM  Result Value Ref Range   Glucose-Capillary 216 (H) 70 - 99 mg/dL  Glucose, capillary     Status: Abnormal   Collection Time: 03/04/23 12:13 AM  Result Value Ref Range   Glucose-Capillary 253 (H) 70 - 99 mg/dL  Glucose, capillary     Status: Abnormal   Collection Time: 03/04/23  3:34 AM  Result Value Ref Range   Glucose-Capillary 236 (H) 70 - 99 mg/dL  CBC     Status: Abnormal   Collection Time: 03/04/23  6:11 AM  Result Value Ref Range   WBC 14.2 (H) 4.0 - 10.5 K/uL   RBC 3.06 (L) 3.87 - 5.11 MIL/uL   Hemoglobin 9.7 (L) 12.0 - 15.0 g/dL   HCT 69.6 (L) 63.9 - 53.9 %   MCV 99.0 80.0 -  100.0 fL   MCH 31.7 26.0 - 34.0 pg   MCHC 32.0 30.0 - 36.0 g/dL   RDW 85.3 88.4 - 84.4 %   Platelets 174 150 - 400 K/uL   nRBC 0.0 0.0 - 0.2 %  Basic metabolic panel     Status: Abnormal   Collection Time: 03/04/23  6:11 AM  Result Value Ref Range   Sodium 135 135 - 145 mmol/L   Potassium 4.4 3.5 - 5.1 mmol/L   Chloride 96 (L) 98 - 111 mmol/L   CO2 26 22 - 32 mmol/L   Glucose, Bld 211 (H) 70 - 99 mg/dL   BUN 38 (H) 8 - 23 mg/dL   Creatinine, Ser 3.24 (H) 0.44 - 1.00 mg/dL   Calcium  9.1 8.9 - 10.3 mg/dL   GFR, Estimated 6 (L) >60 mL/min   Anion gap 13 5 - 15  Glucose, capillary     Status: Abnormal   Collection Time: 03/04/23  8:51 AM  Result Value Ref Range   Glucose-Capillary 217 (H) 70 - 99 mg/dL     PHYSICAL EXAM:   Gen: resting comfortably in bed, NAD, pleasant Lungs: unlabored Ext:       Right Lower Extremity   Dressing R hip with some scant drainage  Ext warm   Distal motor and sensory functions intact and symmetric to contralateral side  Minimal swelling   No DCT    Assessment/Plan: * Day of Surgery *   Principal Problem:   Closed displaced fracture of right femoral neck (HCC) Active Problems:   Type 2 diabetes mellitus, controlled, with  renal complications (HCC)   Chronic combined systolic and diastolic heart failure (HCC)   ESRD on hemodialysis (HCC)   Hypotension   Paroxysmal atrial fibrillation (HCC)   Dementia without behavioral disturbance (HCC)   Anti-infectives (From admission, onward)    Start     Dose/Rate Route Frequency Ordered Stop   03/03/23 1500  ceFAZolin  (ANCEF ) IVPB 1 g/50 mL premix        1 g 100 mL/hr over 30 Minutes Intravenous  Once 03/03/23 1401 03/03/23 1502   03/03/23 0730  ceFAZolin  (ANCEF ) IVPB 2g/100 mL premix        2 g 200 mL/hr over 30 Minutes Intravenous On call to O.R. 03/03/23 0704 03/03/23 0846   03/03/23 0712  ceFAZolin  (ANCEF ) 2-4 GM/100ML-% IVPB       Note to Pharmacy: Marvin Domino E: cabinet override      03/03/23 0712 03/03/23 0914     .  POD/HD#: 1  83 y/o female s/p fall with R femoral neck fracture s/p R hip hemiarthroplasty     Weightbearing: WBAT RLE ROM: posterior hip precautions R hip  Insicional and dressing care: Daily dressing changes with mepilex or 4x4 gauze and tape starting on 03/05/2022 Pain management: multimodal  PI:ezmpne abx  Impediments to Fracture Healing: CKD, DM, fragility fracture Bone Health/Optimization: metabolic bone disease per renal   VTE prophylaxis:  ok to resume home eliquis    Dispo: pending.  Await PT/OT recs, may need SNF   Follow - up plan: 2 weeks  Contact information:  Ozell Bruch MD, Francis Mt PA-C   Francis MICAEL Mt, PA-C [phone] (C) 03/04/2023, 10:30 AM  [facility] [address] [place] [address] [phone] (F)    After 5pm and on the weekends please log on to Amion, go to orthopaedics and the look under the [facility] Call for the provider(s) on  call. You can also call our office at [phone] and then follow the prompts to be connected to the call team.  Patient ID: Stephanie Woodward, female   DOB: 12-04-1940, 83 y.o.   MRN: [id number]

## 2023-03-04 NOTE — Progress Notes (Signed)
 [facility] Progress Note   Subjective:   Patient seen in room after her fistulagram this morning. Patient was alert and in good spirits. Her daughter was visiting her this morning. She states that she is not having much pain from her hip surgery yesterday. She did state that she was having some constipation despite receiving dulcolax. RN stated that she would give her some more medication to help with constipation.   This morning she denies and chest pain, dyspnea, or SOB. No complaints this morning.   Objective Vitals:   03/04/23 0030 03/04/23 0300 03/04/23 0630 03/04/23 0748  BP: 123/69 113/64 (!) 120/47   Pulse: 76 77 65   Resp:      Temp: 98.2 F (36.8 C) 98.2 F (36.8 C) 99.2 F (37.3 C)   TempSrc: Oral Oral Oral   SpO2: 97% 98% 100% 90%  Weight:      Height:       Physical Exam General:Pleasant elderly woman, more alert, in good spirits Heart:RRR, no murmur or bruit Lungs:CTA, 1L O2 [place] Abdomen: soft, non-tender, non-distended Extremities: on UE or LE edema present, dressing over hip incision on right side Dialysis Access: LUA AVF + b/t  Additional Objective Labs: Basic Metabolic Panel: Recent Labs  Lab 03/02/23 0234 03/03/23 0740 03/04/23 0611  NA 138 135 135  K 4.0 4.4 4.4  CL 96* 95* 96*  CO2 26  --  26  GLUCOSE 71 158* 211*  BUN 29* 21 38*  CREATININE 6.94* 5.30* 6.75*  CALCIUM  8.9  --  9.1  PHOS 4.9*  --   --    Liver Function Tests: Recent Labs  Lab 03/02/23 0234  ALBUMIN  3.2*    CBC: Recent Labs  Lab 03/02/23 0234 03/03/23 0740 03/04/23 0611  WBC 12.2*  --  14.2*  NEUTROABS 10.1*  --   --   HGB 12.3 12.9 9.7*  HCT 38.5 38.0 30.3*  MCV 100.0  --  99.0  PLT 197  --  174    CBG: Recent Labs  Lab 03/03/23 1629 03/03/23 2015 03/04/23 0013 03/04/23 0334 03/04/23 0851  GLUCAP 223* 216* 253* 236* 217*    Medications:  anticoagulant sodium citrate       (feeding supplement) PROSource Plus  30 mL Oral BID BM    acetaminophen   1,000 mg Oral Once   amiodarone   200 mg Oral Daily   atorvastatin   80 mg Oral QHS   Chlorhexidine  Gluconate Cloth  6 each Topical Q0600   docusate sodium   100 mg Oral BID   insulin  aspart  0-6 Units Subcutaneous Q4H   insulin  glargine-yfgn  10 Units Subcutaneous Daily   multivitamin  1 tablet Oral QHS   pantoprazole   40 mg Oral BID    Dialysis [facility] MWF  4h   350/600   77.3kg   2/2 bath   AVF LUA  Heparin  none - hectorol  9 mcg  - last Hb 12.0 Assessment/Plan: Right hip fracture: fall at home, unipolar hemiarthroplasty of right hip on 1/9 ESRD: HD today. Continue with regular HD schedule HTN/volume: BP stable today, pre-hd midodrine , under dry weight, no UE or LE edema Anemia of ESRD: Hgb 9.7 today. Will order Aranesp  60 mcg today. Secondary HPTH: Ca and phos good today. Not on binder. Hectorol  9 mcg Nutrition: Albumin  3.2. Carb modified diet. Continue supplements Dementia: chronic, stable, baseline DM: continue with lantus  PAF: continue with amio. Eliquis  on hold Combined diastolic/systolic CHF: EF at 55-60%, still G3DD Prolonged bleeding of  fistula: fistulagram this morning. No evidence of stenotic segments, inflow and centrals widely patient, cephalic vein fistula is orthogonal to rest of cephalic vein, need to map out cephalic vein to prevent cannulating close to orthogonal point   [employer], AGNP 03/04/2023, 11:12 AM  [employer]

## 2023-03-04 NOTE — Progress Notes (Signed)
 Daughter [name] request she be updated with new information due to patient is not able to comprehend information. Contact information in chart.

## 2023-03-04 NOTE — Op Note (Signed)
 Patient presents with prolonged bleeding in her left BCF; her last procedure was in 2022.    On examination, the brachial cephalic fistula is not overly pulsatile with good bruit.  Summary:  1)      The patient had a normal fistulogram in the sense that there is no evidence of any stenotic segments.   2)      The body of the cephalic vein fistula was patent but the outflow below the mid upper arm level is actually orthogonal to the rest of the cephalic vein which may be why there are issues cannulating.  Need to really map out the course of the cephalic vein and avoid cannulating close to that orthogonal entry point.   3)      The inflow and centrals were widely patent. 4)      This left BCF remains amenable to future percutaneous intervention.  Description of procedure: The arm was prepped and draped in the usual sterile fashion. The left upper arm brachial cephalic fistula was cannulated (63098) with an 18G Angiocath needle directed in an antegrade direction. A guidewire was inserted and exchanged for a 6 Fr sheath. Contrast [phone]) injection via the side port of the sheath was performed. The angiogram of the fistula (63098) showed a patent body of the left BCF with an orthogonal entry point into the rest of the cephalic at approximately distal third to mid upper arm level.  The rest of the outflow cephalic vein, cephalic vein arch, inflow anastomosis and left centrals were patent.  Hemostasis: A 3-0 ethilon purse string suture was placed at the cannulation site on removal of the sheath.  Sedation: 0.5mg  Versed , 25 mcg Fentanyl . Sedation time. 9 minutes  Contrast. 10 mL  Monitoring: Because of the patient's comorbid conditions and sedation during the procedure, continuous EKG monitoring and O2 saturation monitoring was performed throughout the procedure by the RN. There were no abnormal arrhythmias encountered.  Complications: None.   Diagnoses: I87.1 Stricture of vein  N18.6  ESRD T82.858A Stricture of access  Procedure Coding:  36901 Cannulation and angiogram of fistula Q9967 Contrast  Recommendations:  1. Continue to cannulate the fistula with 15G needles.  2. Refer back for problems with flows; need to map course of fistula carefully as mid upper arm the vein empties almost orthogonal into a good calibered rest of the cephalic. 3. Remove the suture next treatment.   Discharge: The patient was discharged home in stable condition. The patient was given education regarding the care of the dialysis access AVF and specific instructions in case of any problems.

## 2023-03-04 NOTE — Progress Notes (Signed)
    Patient Name: [name]           DOB: [birth year]  MRN: [id number]      Admission Date: 03/02/2023  Attending Provider: Arlice Reichert, MD  Primary Diagnosis: Closed displaced fracture of right femoral neck (HCC)   Level of care: Med-Surg    CROSS COVER NOTE   Date of Service   03/04/2023   Stephanie Woodward, 83 y.o. female, was admitted on 03/02/2023 for Closed displaced fracture of right femoral neck (HCC).    HPI/Events of Note   Hypotensive, BP 73/23 (39), verified on retake Chronic hypotension-receives midodrine  prior to dialysis Asymptomatic, alert and oriented. Had hemodialysis today, 0.5 L removed.  Addendum:  BP improving, SBP 100- 110's.    Interventions/ Plan   IV albumin , Q6  x2 Additional midodrine , 10 mg        Vere Diantonio, DNP, ACNPC- AG [facility] [place]

## 2023-03-04 NOTE — Evaluation (Signed)
 Physical Therapy Evaluation Patient Details Name: Stephanie Woodward MRN: [id number] DOB: 05-Jun-1940 Today's Date: 03/04/2023  History of Present Illness  83 y.o. female presents to [facility] 03/02/23 after falling out of bed  w/ R femoral neck fx. S/p hemiarthroplasty of R hip 1/9. PMHx: arthritis, a-fib, CHF, CKD, COPD, DM, gout, HTN   Clinical Impression  Pt in bed upon arrival with daughter present and agreeable to PT eval. Prior to admit, pt occasionally needed physical assistance for mobility and ADLs. Pt reported performing step pivots to WC at baseline. Pt receives PT, OT, and [facility] x2 a day for 5 days a week for assist with mobility, ADLs, and iADLs. In today's session, pt was limited by pain in R hip with movement and lightheadedness upon sitting upright, VSS on 1L. Pt required totalAx2 for supine/sit for all aspects. Pt with posterior and left lateral lean upon sitting upright with ModA/MinA needed to support. Pt presents to therapy session with decreased balance, strength, and mobility. Pt would benefit from acute skilled PT to address functional impairments. Recommending post-acute rehab <3hrs at current mobility level. If pt is able to transfer to [facility] with one person assist, daughter would prefer for pt to return home with 24/7 assist available. Acute PT to follow.          If plan is discharge home, recommend the following: A lot of help with walking and/or transfers;A lot of help with bathing/dressing/bathroom;Assistance with cooking/housework;Assistance with feeding;Direct supervision/assist for medications management;Direct supervision/assist for financial management;Help with stairs or ramp for entrance;Assist for transportation   Can travel by private vehicle   No    Equipment Recommendations Other (comment) (TBD at next venue)     Functional Status Assessment Patient has had a recent decline in their functional status and demonstrates the ability to make  significant improvements in function in a reasonable and predictable amount of time.     Precautions / Restrictions Precautions Precautions: Posterior Hip;Fall Precaution Booklet Issued: Yes (comment) Restrictions Weight Bearing Restrictions Per Provider Order: Yes RLE Weight Bearing Per Provider Order: Weight bearing as tolerated      Mobility  Bed Mobility Overal bed mobility: Needs Assistance Bed Mobility: Supine to Sit, Sit to Supine     Supine to sit: Total assist, +2 for physical assistance Sit to supine: Total assist, +2 for physical assistance   General bed mobility comments: totalAx2 with use of bed pads and helicopter method. ModA with bed pad to scoot forwards. Left and posterior lean    Transfers  General transfer comment: unable     Balance Overall balance assessment: Needs assistance, History of Falls, Mild deficits observed, not formally tested Sitting-balance support: Bilateral upper extremity supported, Feet supported Sitting balance-Leahy Scale: Poor Sitting balance - Comments: ModA to MinA to maintain sitting balance. Left lateral lean and posterior lean. Cues to sit upright and lean forward with little follow through. Postural control: Posterior lean, Left lateral lean         Pertinent Vitals/Pain Pain Assessment Pain Assessment: Faces Faces Pain Scale: Hurts whole lot Pain Location: R hip with movement Pain Descriptors / Indicators: Aching, Discomfort, Guarding, Operative site guarding Pain Intervention(s): Limited activity within patient's tolerance, Monitored during session, Repositioned    Home Living Family/patient expects to be discharged to:: Private residence Living Arrangements: Alone Available Help at Discharge: Available 24 hours/day;Personal care attendant;Family;Other (Comment) (staff of facility) Type of Home: Independent living facility Home Access: Level entry  Home Layout: One level Home Equipment: Wheelchair -  manual;Other (comment);Grab bars - toilet;Grab bars - tub/shower;Shower seat;Shower seat - built in (hemi walker) Additional Comments: [facility], a couple hours 2x/day 5 days week    Prior Function Prior Level of Function : Needs assist       Physical Assist : Mobility (physical);ADLs (physical) Mobility (physical): Bed mobility;Transfers;Gait ADLs (physical): Feeding;Grooming;Bathing;Dressing;Toileting;IADLs Mobility Comments: on days of dialysis she needs more assist, but on good days able to transfer with min to supervision using hemi walker as needed. ADLs Comments: assist for ADLs from aide, but able to get to bathroom on her own if needed (using w/c)     Extremity/Trunk Assessment   Upper Extremity Assessment Upper Extremity Assessment: Defer to OT evaluation    Lower Extremity Assessment Lower Extremity Assessment: Generalized weakness (R>L as expected post-surgery. Alert to light touch, able to wiggle toes)    Cervical / Trunk Assessment Cervical / Trunk Assessment: Kyphotic  Communication   Communication Communication: No apparent difficulties Cueing Techniques: Verbal cues;Tactile cues  Cognition Arousal: Alert Behavior During Therapy: WFL for tasks assessed/performed Overall Cognitive Status: History of cognitive impairments - at baseline    General Comments: Not oriented to situation, does not remember having surgery on R hip. Remembers sliding out of bed for falling.        General Comments General comments (skin integrity, edema, etc.): Pt felt lightheaded after sitting upright. BP WNL and 93% SpO2 on 1 L. Pt cued for deep breathing with minimal relief in lightheadedness. Dropped to 88% upon return to supine with quick raise to 92%. RN notified     PT Assessment Patient needs continued PT services  PT Problem List Decreased strength;Decreased range of motion;Decreased activity tolerance;Decreased mobility;Decreased balance;Decreased knowledge of use  of DME;Decreased safety awareness;Decreased knowledge of precautions;Pain       PT Treatment Interventions DME instruction;Gait training;Functional mobility training;Therapeutic activities;Therapeutic exercise;Balance training;Patient/family education;Neuromuscular re-education;Wheelchair mobility training    PT Goals (Current goals can be found in the Care Plan section)  Acute Rehab PT Goals Patient Stated Goal: to get better and return home PT Goal Formulation: With patient/family Time For Goal Achievement: 03/18/23 Potential to Achieve Goals: Fair    Frequency Min 1X/week        AM-PAC PT 6 Clicks Mobility  Outcome Measure Help needed turning from your back to your side while in a flat bed without using bedrails?: Total Help needed moving from lying on your back to sitting on the side of a flat bed without using bedrails?: Total Help needed moving to and from a bed to a chair (including a wheelchair)?: Total Help needed standing up from a chair using your arms (e.g., wheelchair or bedside chair)?: Total Help needed to walk in hospital room?: Total Help needed climbing 3-5 steps with a railing? : Total 6 Click Score: 6    End of Session Equipment Utilized During Treatment: Oxygen Activity Tolerance: Patient limited by pain;Other (comment) (lightheadedness) Patient left: in bed;with call bell/phone within reach;with bed alarm set;with family/visitor present Nurse Communication: Mobility status PT Visit Diagnosis: Unsteadiness on feet (R26.81);History of falling (Z91.81);Muscle weakness (generalized) (M62.81)    Time: 1040-1108 PT Time Calculation (min) (ACUTE ONLY): 28 min   Charges:   PT Evaluation $PT Eval Moderate Complexity: 1 Mod   PT General Charges $$ ACUTE PT VISIT: 1 Visit         Stephanie ORN, PT, DPT Secure Chat Preferred  Rehab Office [phone]   Stephanie  E Suri Woodward 03/04/2023, 12:00 PM

## 2023-03-04 NOTE — Discharge Instructions (Signed)

## 2023-03-04 NOTE — Progress Notes (Signed)
 Pt receives out-pt HD at [facility] on MWF with 12:20 pm chair time. Will assist as needed.   Olivia Canter Renal Navigator  [phone]

## 2023-03-04 NOTE — Evaluation (Signed)
 Occupational Therapy Evaluation Patient Details Name: Stephanie Woodward MRN: [id number] DOB: 04-19-1940 Today's Date: 03/04/2023   History of Present Illness 83 y.o. female presents to [facility] 03/02/23 after falling out of bed  w/ R femoral neck fx. S/p hemiarthroplasty of R hip 1/9. PMHx: arthritis, a-fib, CHF, CKD, COPD, DM, gout, HTN   Clinical Impression   Patient admitted for above and presents with problem list below.  Daughter reports [facility] assists with ADLs as needed, dialysis days patient needs more assist but at times pt able to self propel herself to the bathroom and manage toileting needs with supervision. Today, patient is oriented x 3 (not to situation) as she does not recall having hip surgery; she is able to follow simple commands with increased time; known hx of dementia.  Requires total assist +2 for bed mobility, max-total assist for Adls.  Based on performance today, believe patient will best benefit from continued OT services acutely and after dc at inpatient setting with <3hrs/day to optimize independence, safety with ADLs and mobility.        If plan is discharge home, recommend the following: Two people to help with walking and/or transfers;Two people to help with bathing/dressing/bathroom    Functional Status Assessment  Patient has had a recent decline in their functional status and demonstrates the ability to make significant improvements in function in a reasonable and predictable amount of time.  Equipment Recommendations  Other (comment) (defer)    Recommendations for Other Services       Precautions / Restrictions Precautions Precautions: Posterior Hip;Fall Precaution Booklet Issued: Yes (comment) Restrictions Weight Bearing Restrictions Per Provider Order: Yes RLE Weight Bearing Per Provider Order: Weight bearing as tolerated      Mobility Bed Mobility Overal bed mobility: Needs Assistance Bed Mobility: Supine to Sit, Sit to Supine     Supine to sit:  Total assist, +2 for physical assistance Sit to supine: Total assist, +2 for physical assistance   General bed mobility comments: totalAx2 with use of bed pads and helicopter method. ModA with bed pad to scoot forwards. Left and posterior lean    Transfers                   General transfer comment: unable      Balance Overall balance assessment: Needs assistance, History of Falls, Mild deficits observed, not formally tested Sitting-balance support: Bilateral upper extremity supported, Feet supported Sitting balance-Leahy Scale: Poor Sitting balance - Comments: ModA to MinA to maintain sitting balance. Left lateral lean and posterior lean. Cues to sit upright and lean forward with little follow through. Postural control: Posterior lean, Left lateral lean                                 ADL either performed or assessed with clinical judgement   ADL Overall ADL's : Needs assistance/impaired Eating/Feeding: Maximal assistance;Bed level Eating/Feeding Details (indicate cue type and reason): per daughter, needing assist to eat today Grooming: Sitting;Maximal assistance Grooming Details (indicate cue type and reason): +2 at EOB         Upper Body Dressing : Maximal assistance;Sitting   Lower Body Dressing: Total assistance;+2 for physical assistance;Sitting/lateral leans     Toilet Transfer Details (indicate cue type and reason): deferred         Functional mobility during ADLs: Total assistance;+2 for physical assistance       Vision   Vision Assessment?: No apparent  visual deficits     Perception         Praxis         Pertinent Vitals/Pain Pain Assessment Pain Assessment: Faces Faces Pain Scale: Hurts whole lot Pain Location: R hip with movement Pain Descriptors / Indicators: Aching, Discomfort, Guarding, Operative site guarding Pain Intervention(s): Limited activity within patient's tolerance, Monitored during session, Repositioned      Extremity/Trunk Assessment Upper Extremity Assessment Upper Extremity Assessment: Generalized weakness (limited shoulder flexion to <45* bilaterally)   Lower Extremity Assessment Lower Extremity Assessment: Defer to PT evaluation   Cervical / Trunk Assessment Cervical / Trunk Assessment: Kyphotic   Communication Communication Communication: No apparent difficulties Cueing Techniques: Verbal cues;Tactile cues   Cognition Arousal: Alert Behavior During Therapy: WFL for tasks assessed/performed Overall Cognitive Status: History of cognitive impairments - at baseline                                 General Comments: Not oriented to situation, does not remember having surgery on R hip. Remembers sliding out of bed for falling. follows simple commands but easily distracted     General Comments  Pt felt lightheaded after sitting upright. BP WNL and 93% SpO2 on 1 L. Pt cued for deep breathing with minimal relief in lightheadedness. Dropped to 88% upon return to supine with quick raise to 92%. RN notified    Exercises     Shoulder Instructions      Home Living Family/patient expects to be discharged to:: Private residence Living Arrangements: Alone Available Help at Discharge: Available 24 hours/day;Personal care attendant;Family;Other (Comment) (staff of facility) Type of Home: Independent living facility Home Access: Level entry     Home Layout: One level     Bathroom Shower/Tub: Producer, Television/film/video: Handicapped height Bathroom Accessibility: Yes How Accessible: Accessible via wheelchair Home Equipment: Wheelchair - manual;Other (comment);Grab bars - toilet;Grab bars - tub/shower;Shower seat;Shower seat - built in (hemi walker)   Additional Comments: [facility], a couple hours 2x/day 5 days week      Prior Functioning/Environment Prior Level of Function : Needs assist       Physical Assist : Mobility (physical);ADLs  (physical) Mobility (physical): Bed mobility;Transfers;Gait ADLs (physical): Feeding;Grooming;Bathing;Dressing;Toileting;IADLs Mobility Comments: on days of dialysis she needs more assist, but on good days able to transfer with min to supervision using hemi walker as needed. ADLs Comments: assist for ADLs from aide, but able to get to bathroom on her own if needed (using w/c)        OT Problem List: Decreased strength;Decreased activity tolerance;Impaired balance (sitting and/or standing);Decreased coordination;Decreased cognition;Decreased safety awareness;Decreased knowledge of use of DME or AE;Decreased knowledge of precautions;Obesity;Pain      OT Treatment/Interventions: Self-care/ADL training;Therapeutic exercise;DME and/or AE instruction;Therapeutic activities;Patient/family education;Balance training    OT Goals(Current goals can be found in the care plan section) Acute Rehab OT Goals Patient Stated Goal: get better OT Goal Formulation: With patient/family Time For Goal Achievement: 03/18/23 Potential to Achieve Goals: Fair  OT Frequency: Min 1X/week    Co-evaluation              AM-PAC OT 6 Clicks Daily Activity     Outcome Measure Help from another person eating meals?: A Lot Help from another person taking care of personal grooming?: A Lot Help from another person toileting, which includes using toliet, bedpan, or urinal?: Total Help from another person bathing (including  washing, rinsing, drying)?: A Lot Help from another person to put on and taking off regular upper body clothing?: A Lot Help from another person to put on and taking off regular lower body clothing?: Total 6 Click Score: 10   End of Session Equipment Utilized During Treatment: Oxygen Nurse Communication: Mobility status  Activity Tolerance: Patient tolerated treatment well Patient left: in bed;with call bell/phone within reach;with bed alarm set;with family/visitor present  OT Visit  Diagnosis: Other abnormalities of gait and mobility (R26.89);Muscle weakness (generalized) (M62.81);Pain Pain - Right/Left: Right Pain - part of body: Hip                Time: 8961-8884 OT Time Calculation (min): 37 min Charges:  OT General Charges $OT Visit: 1 Visit OT Evaluation $OT Eval Moderate Complexity: 1 Mod  [name], OT Acute Rehabilitation Services Office [phone]   [name] 03/04/2023, 2:06 PM

## 2023-03-04 NOTE — Progress Notes (Signed)
 PROGRESS NOTE  Stephanie Woodward  DOB: 1940-10-14  PCP[facility] FMW:[id number]  DOA: 03/02/2023  LOS: 2 days  Hospital Day: 3  Brief narrative: Stephanie Woodward is a 83 y.o. female with PMH significant for ESRD HD MWF, dementia with behavioral disturbance, DM2, HTN, HLD, CHF, A-fib on Eliquis , CKD, COPD, obesity, sleep apnea 1/8, patient was brought to the ED from home after a fall..  Patient was apparently trying to roll over in bed when she slipped out falling to the floor on her buttocks.  She was on the floor for 20 minutes until her daughter arrived.  Complained of right hip pain.  EMS was called and patient was brought to the ED. At baseline, she uses a wheelchair for mobility.  X-ray showed closed displaced fracture of right femoral neck Admitted to [facility] Dr. Sharl was consulted for surgical fixation 1/9, underwent right hip hemiarthroplasty  Subjective: Patient was seen and examined this morning.   Lying on bed.  Not in distress.  Daughter at bedside.   Underwent fistulogram this morning. Pending eval.    Assessment and plan: Closed displaced fracture of right femoral neck S/p right hip hemiarthroplasty -1/9 Dr. Celena Secondary to a fall from bed Imaging and procedure as above Pain control with scheduled Tylenol , as needed Norco, as needed IV morphine  Bowel regimen -Senokot daily, MiraLAX  as needed DVT prophylaxis -heparin  subcu for now with the plan to resume Eliquis  once hemoglobin stabilizes PT OT eval pending   ESRD HD MWF Nephro following. 1/10 underwent fistulogram.  No stenotic segment found.  Chronic combined systolic and diastolic CHF  Hypotension Most recent echo from March 2024 with EF 55 to 60% and grade 2 diastolic dysfunction  Because of chronic hypertension, patient is on midodrine  before dialysis on MWF. Continue the same  Type 2 diabetes mellitus A1c 7.9 on 05/19/2022 PTA meds- Lantus  20 units in  a.m. Currently on SSI/Accu-Cheks.  Blood sugar trending up.  Resumed Semglee  at 10 units this morning. Recent Labs  Lab 03/03/23 2015 03/04/23 0013 03/04/23 0334 03/04/23 0851 03/04/23 1132  GLUCAP 216* 253* 236* 217* 221*   Dementia without behavioral disturbance  Chronic, stable, baseline   Paroxysmal atrial fibrillation Cont amiodarone  Eliquis  on hold for surgery  GERD PPI   Mobility: Needs PT eval postprocedure  Goals of care   Code Status: Full Code    DVT prophylaxis:  heparin  injection 5,000 Units Start: 03/04/23 1400 SCDs Start: 03/03/23 1402 Place and maintain sequential compression device Start: 03/02/23 1443   Antimicrobials: None Fluid: None Consultants: Orthopedics Family Communication: Daughter at bedside  Status: Inpatient Level of care:  Med-Surg   Patient is from: Home Needs to continue in-hospital care: POD 1, pending PT eval Anticipated d/c to: Pending clinical course    Diet:  Diet Order             Diet Carb Modified Fluid consistency: Thin; Room service appropriate? Yes  Diet effective now                   Scheduled Meds:  (feeding supplement) PROSource Plus  30 mL Oral BID BM   acetaminophen   1,000 mg Oral TID   amiodarone   200 mg Oral Daily   atorvastatin   80 mg Oral QHS   Chlorhexidine  Gluconate Cloth  6 each Topical Q0600   darbepoetin (ARANESP ) injection - DIALYSIS  60 mcg Subcutaneous Q Fri-1800   heparin  injection (subcutaneous)  5,000 Units Subcutaneous Q8H  insulin  aspart  0-5 Units Subcutaneous QHS   insulin  aspart  0-9 Units Subcutaneous TID WC   insulin  glargine-yfgn  10 Units Subcutaneous Daily   multivitamin  1 tablet Oral QHS   pantoprazole   40 mg Oral BID   senna-docusate  1 tablet Oral QHS    PRN meds: alteplase , anticoagulant sodium citrate , diphenhydrAMINE , feeding supplement (NEPRO CARB STEADY), heparin , lidocaine  (PF), lidocaine -prilocaine , menthol -cetylpyridinium **OR** phenol, metoCLOPramide   **OR** metoCLOPramide  (REGLAN ) injection, midodrine , morphine  injection, oxyCODONE , pentafluoroprop-tetrafluoroeth, polyethylene glycol   Infusions:   anticoagulant sodium citrate       Antimicrobials: Anti-infectives (From admission, onward)    Start     Dose/Rate Route Frequency Ordered Stop   03/03/23 1500  ceFAZolin  (ANCEF ) IVPB 1 g/50 mL premix        1 g 100 mL/hr over 30 Minutes Intravenous  Once 03/03/23 1401 03/03/23 1502   03/03/23 0730  ceFAZolin  (ANCEF ) IVPB 2g/100 mL premix        2 g 200 mL/hr over 30 Minutes Intravenous On call to O.R. 03/03/23 0704 03/03/23 0846   03/03/23 0712  ceFAZolin  (ANCEF ) 2-4 GM/100ML-% IVPB       Note to Pharmacy: Marvin Domino E: cabinet override      03/03/23 0712 03/03/23 0914       Objective: Vitals:   03/04/23 1256 03/04/23 1326  BP: (!) 111/34 (!) 110/35  Pulse: 70 68  Resp: 20 11  Temp:    SpO2: 100% 100%    Intake/Output Summary (Last 24 hours) at 03/04/2023 1400 Last data filed at 03/04/2023 0900 Gross per 24 hour  Intake 477 ml  Output --  Net 477 ml   Filed Weights   03/02/23 0139 03/03/23 0656  Weight: 76 kg 76 kg   Weight change:  Body mass index is 30.65 kg/m.   Physical Exam: General exam: Pleasant, elderly.  Not in distress. Skin: No rashes, lesions or ulcers. HEENT: Atraumatic, normocephalic, no obvious bleeding Lungs: Clear to auscultation bilaterally,  CVS: S1, S2, no murmur,   GI/Abd: Soft, nontender, nondistended, bowel sound present,   CNS: Alert, awake, able to answer orientation questions Psychiatry: Mood appropriate,  Extremities: No pedal edema, no calf tenderness  Data Review: I have personally reviewed the laboratory data and studies available.  F/u labs collected Unresulted Labs (From admission, onward)     Start     Ordered   03/04/23 0500  CBC  Daily,   R     Comments: For 3 days.   Question:  Specimen collection method  Answer:  Lab=Lab collect   03/03/23 1401   03/04/23 0500   Basic metabolic panel  Daily,   R     Comments: For 2 days .   Question:  Specimen collection method  Answer:  Lab=Lab collect   03/03/23 1401            Total time spent in review of labs and imaging, patient evaluation, formulation of plan, documentation and communication with family: 45 minutes  Signed, Chapman Rota, MD [facility] 03/04/2023

## 2023-03-04 NOTE — Progress Notes (Signed)
 SLP Cancellation Note  Patient Details Name: [name] MRN: [id number] DOB: 1940-04-05   Cancelled treatment:        Pt off floor for procedure. Fistulagram with HD to follow.  SLP will reattempt as schedule permits.   Anette FORBES Grippe, MA, CCC-SLP Acute Rehabilitation Services Office: [phone] 03/04/2023, 7:49 AM

## 2023-03-04 NOTE — Progress Notes (Signed)
 OT Cancellation Note  Patient Details Name: Stephanie Woodward MRN: [id number] DOB: February 24, 1940   Cancelled Treatment:    Reason Eval/Treat Not Completed: Patient at procedure or test/ unavailable- will follow and see as able.   [name], OT Acute Rehabilitation Services Office [phone]   [name] 03/04/2023, 8:18 AM

## 2023-03-04 NOTE — H&P (Signed)
 Interval H&P  The patient has presented today for an angiogram/ angioplasty for prolonged bleeding.  Patient just had right hip surgery yesterday.  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  Various methods of treatment have been discussed with the patient.  After consideration of risk, benefits and other options for treatment, the patient has consented to a angiogram/ angioplasty with  possible stent placement.   Risks of angiogram with potential angioplasty and stenting if needed.contrast reaction, extravasation/ bleeding, dissection, hypotension and death were explained to the patient.  The patient's history has been reviewed and the patient has been examined, no changes in status.  Stable for angiogram/angioplasty  I have reviewed the patient's chart and labs.  Questions were answered to the patient's satisfaction.

## 2023-03-04 NOTE — Anesthesia Postprocedure Evaluation (Signed)
 Anesthesia Post Note  Patient: Stephanie Woodward  Procedure(s) Performed: ARTHROPLASTY BIPOLAR HIP (HEMIARTHROPLASTY) POSTERIOR (Right: Hip)     Patient location during evaluation: PACU Anesthesia Type: General Level of consciousness: awake and alert Pain management: pain level controlled Vital Signs Assessment: post-procedure vital signs reviewed and stable Respiratory status: spontaneous breathing, nonlabored ventilation, respiratory function stable and patient connected to nasal cannula oxygen Cardiovascular status: blood pressure returned to baseline and stable Postop Assessment: no apparent nausea or vomiting Anesthetic complications: no   No notable events documented.  Last Vitals:  Vitals:   03/04/23 0630 03/04/23 0748  BP: (!) 120/47   Pulse: 65   Resp:    Temp: 37.3 C   SpO2: 100% 90%    Last Pain:  Vitals:   03/04/23 0805  TempSrc:   PainSc: 4                  Kross Swallows S

## 2023-03-05 DIAGNOSIS — S72001A Fracture of unspecified part of neck of right femur, initial encounter for closed fracture: Secondary | ICD-10-CM | POA: Diagnosis not present

## 2023-03-05 LAB — BASIC METABOLIC PANEL
Anion gap: 12 (ref 5–15)
BUN: 30 mg/dL — ABNORMAL HIGH (ref 8–23)
CO2: 27 mmol/L (ref 22–32)
Calcium: 8.6 mg/dL — ABNORMAL LOW (ref 8.9–10.3)
Chloride: 97 mmol/L — ABNORMAL LOW (ref 98–111)
Creatinine, Ser: 4.59 mg/dL — ABNORMAL HIGH (ref 0.44–1.00)
GFR, Estimated: 9 mL/min — ABNORMAL LOW (ref >60)
Glucose, Bld: 185 mg/dL — ABNORMAL HIGH (ref 70–99)
Potassium: 4.2 mmol/L (ref 3.5–5.1)
Sodium: 136 mmol/L (ref 135–145)

## 2023-03-05 LAB — CBC
HCT: 26.7 % — ABNORMAL LOW (ref 36.0–46.0)
Hemoglobin: 8.6 g/dL — ABNORMAL LOW (ref 12.0–15.0)
MCH: 31.7 pg (ref 26.0–34.0)
MCHC: 32.2 g/dL (ref 30.0–36.0)
MCV: 98.5 fL (ref 80.0–100.0)
Platelets: 161 10*3/uL (ref 150–400)
RBC: 2.71 MIL/uL — ABNORMAL LOW (ref 3.87–5.11)
RDW: 14.9 % (ref 11.5–15.5)
WBC: 13.4 10*3/uL — ABNORMAL HIGH (ref 4.0–10.5)
nRBC: 0.1 % (ref 0.0–0.2)

## 2023-03-05 LAB — GLUCOSE, CAPILLARY
Glucose-Capillary: 239 mg/dL — ABNORMAL HIGH (ref 70–99)
Glucose-Capillary: 244 mg/dL — ABNORMAL HIGH (ref 70–99)
Glucose-Capillary: 296 mg/dL — ABNORMAL HIGH (ref 70–99)
Glucose-Capillary: 222 mg/dL — ABNORMAL HIGH (ref 70–99)
Glucose-Capillary: 244 mg/dL — ABNORMAL HIGH (ref 70–99)

## 2023-03-05 MED ORDER — INSULIN GLARGINE-YFGN 100 UNIT/ML ~~LOC~~ SOLN
20.0000 [IU] | Freq: Every day | SUBCUTANEOUS | Status: DC
  Administered 2023-03-06 – 2023-03-11 (×5): 20 [IU] via SUBCUTANEOUS
  Filled 2023-03-05 (×8): qty 0.2

## 2023-03-05 MED ORDER — INSULIN GLARGINE-YFGN 100 UNIT/ML ~~LOC~~ SOLN
10.0000 [IU] | Freq: Once | SUBCUTANEOUS | Status: AC
  Administered 2023-03-05: 10 [IU] via SUBCUTANEOUS
  Filled 2023-03-05: qty 0.1

## 2023-03-05 MED ORDER — MIDODRINE HCL 5 MG PO TABS
10.0000 mg | ORAL_TABLET | Freq: Every day | ORAL | Status: DC
  Administered 2023-03-05 – 2023-03-08 (×4): 10 mg via ORAL
  Filled 2023-03-05 (×4): qty 2

## 2023-03-05 NOTE — Progress Notes (Signed)
 [facility] Progress Note   Subjective:   Patient seen and examined at bedside.  Reports pain is mostly well controlled.  Denies CP, SOB, abdominal pain and n/v/d.  No specific complaints.   Objective Vitals:   03/04/23 2211 03/05/23 0100 03/05/23 0607 03/05/23 1014  BP: (!) 107/34 (!) 110/42 (!) 116/36 (!) 118/33  Pulse:  67 60 61  Resp:      Temp:  99 F (37.2 C) 98.4 F (36.9 C) 99 F (37.2 C)  TempSrc:  Oral Oral Oral  SpO2:  100% 100% 100%  Weight:      Height:       Physical Exam General:chronically ill appearing, elderly female in NAD Heart:RRR Lungs:CTAB, nml WOB on RA Abdomen:soft, NTND Extremities:trace LE edema, chronic LE changes Dialysis Access: LU AVF +b/t   Filed Weights   03/02/23 0139 03/03/23 0656  Weight: 76 kg 76 kg    Intake/Output Summary (Last 24 hours) at 03/05/2023 1253 Last data filed at 03/04/2023 1609 Gross per 24 hour  Intake --  Output 0.5 ml  Net -0.5 ml    Additional Objective Labs: Basic Metabolic Panel: Recent Labs  Lab 03/02/23 0234 03/03/23 0740 03/04/23 0611 03/05/23 0424  NA 138 135 135 136  K 4.0 4.4 4.4 4.2  CL 96* 95* 96* 97*  CO2 26  --  26 27  GLUCOSE 71 158* 211* 185*  BUN 29* 21 38* 30*  CREATININE 6.94* 5.30* 6.75* 4.59*  CALCIUM  8.9  --  9.1 8.6*  PHOS 4.9*  --   --   --    Liver Function Tests: Recent Labs  Lab 03/02/23 0234  ALBUMIN  3.2*   CBC: Recent Labs  Lab 03/02/23 0234 03/03/23 0740 03/04/23 0611 03/05/23 0424  WBC 12.2*  --  14.2* 13.4*  NEUTROABS 10.1*  --   --   --   HGB 12.3 12.9 9.7* 8.6*  HCT 38.5 38.0 30.3* 26.7*  MCV 100.0  --  99.0 98.5  PLT 197  --  174 161   Blood Culture    Component Value Date/Time   SDES BLOOD LEFT HAND 05/02/2021 0201   SPECREQUEST  05/02/2021 0201    BOTTLES DRAWN AEROBIC AND ANAEROBIC Blood Culture results may not be optimal due to an inadequate volume of blood received in culture bottles   CULT  05/02/2021 0201    NO GROWTH 5  DAYS Performed at [facility], [address] [place], [place] [address]    REPTSTATUS 05/07/2021 FINAL 05/02/2021 0201   CBG: Recent Labs  Lab 03/04/23 1730 03/04/23 2049 03/05/23 0432 03/05/23 1003 03/05/23 1152  GLUCAP 172* 459* 239* 244* 296*    Medications:   (feeding supplement) PROSource Plus  30 mL Oral BID BM   acetaminophen   1,000 mg Oral TID   amiodarone   200 mg Oral Daily   atorvastatin   80 mg Oral QHS   Chlorhexidine  Gluconate Cloth  6 each Topical Q0600   darbepoetin (ARANESP ) injection - DIALYSIS  60 mcg Subcutaneous Q Fri-1800   heparin  injection (subcutaneous)  5,000 Units Subcutaneous Q8H   insulin  aspart  0-5 Units Subcutaneous QHS   insulin  aspart  0-9 Units Subcutaneous TID WC   insulin  glargine-yfgn  10 Units Subcutaneous Daily   midodrine   10 mg Oral Daily   multivitamin  1 tablet Oral QHS   pantoprazole   40 mg Oral BID   senna-docusate  1 tablet Oral QHS    Dialysis Orders: [facility] MWF  4h   350/600   77.3kg   2/2 bath   AVF LUA  Heparin  none - hectorol  9 mcg  - last Hb 12.0  Assessment/Plan: Right hip fracture: fall at home, unipolar hemiarthroplasty of right hip on 1/9 ESRD: MWF HD.  Next HD on 03/06/22.  HTN/volume: BP stable.  Gets midodrine  pre-hd. Under dry weight,  possible weight loss. Does not appear grossly overloaded.  Plan for UF as tolerated.   Anemia of ESRD: Hgb dropping, 8.6 today.   Aranesp  60 mcg given yesterday. Secondary HPTH: Ca and phos good today. Not on binder. Hectorol  9 mcg Nutrition: Albumin  3.2. Carb modified diet. Continue supplements Dementia: chronic, stable, baseline DM: continue with lantus  PAF: continue with amio. Eliquis  on hold Combined diastolic/systolic CHF: EF at 55-60%, still G3DD Prolonged bleeding of fistula: fistulagram 03/03/22. No evidence of stenotic segments, inflow and centrals widely patient, cephalic vein fistula is orthogonal to rest of cephalic vein, need to map out cephalic vein to  prevent cannulating close to orthogonal point  Liz [employer], PA-C [facility] 03/05/2023,12:53 PM  LOS: 3 days

## 2023-03-05 NOTE — Progress Notes (Signed)
 PROGRESS NOTE  Stephanie Woodward  DOB: 06-03-1940  PCP: [facility] FMW:[id number]  DOA: 03/02/2023  LOS: 3 days  Hospital Day: 4  Brief narrative: Stephanie Woodward is a 83 y.o. female with PMH significant for ESRD HD MWF, dementia with behavioral disturbance, DM2, HTN, HLD, CHF, A-fib on Eliquis , CKD, COPD, obesity, sleep apnea 1/8, patient was brought to the ED from home after a fall..  Patient was apparently trying to roll over in bed when she slipped out falling to the floor on her buttocks.  She was on the floor for 20 minutes until her daughter arrived.  Complained of right hip pain.  EMS was called and patient was brought to the ED. At baseline, she uses a wheelchair for mobility.  X-ray showed closed displaced fracture of right femoral neck Admitted to [facility] Orthoped[facility] Dr. Sharl was consulted for surgical fixation 1/9, underwent right hip hemiarthroplasty  Subjective: Patient was seen and examined this morning.   Lying on bed.  Not in distress. Blood pressure running low.  I switched her from midodrine  MWF to daily.  Assessment and plan: Closed displaced fracture of right femoral neck S/p right hip hemiarthroplasty -1/9 Dr. Celena Secondary to a fall from bed Imaging and procedure as above Pain control with scheduled Tylenol , as needed Norco, as needed IV morphine  Bowel regimen -Senokot daily, MiraLAX  as needed DVT prophylaxis -heparin  subcu for now with the plan to resume Eliquis  once hemoglobin stabilizes PT OT eval pending  Acute blood loss anemia Baseline hemoglobin above 12.  Post op, hemoglobin is downtrending secondary to blood loss from fracture and surgery Hemoglobin down by 1 in the last 24 hours.  Blood pressure down as well. No bleeding elsewhere. Continue to monitor. Eliquis  on hold.  On subcu heparin  for DVT prophylaxis Repeat H&H and type and screen tomorrow. Recent Labs    04/15/22 1435 05/19/22 2150 11/02/22 1317  03/02/23 0234 03/03/23 0740 03/04/23 0611 03/05/23 0424  HGB  --    < > 12.7 12.3 12.9 9.7* 8.6*  MCV  --    < > 98.8 100.0  --  99.0 98.5  VITAMINB12 818  --   --   --   --   --   --    < > = values in this interval not displayed.   ESRD HD MWF Nephro following. 1/10 underwent fistulogram.  No stenotic segment found.  Chronic combined systolic and diastolic CHF  Hypotension Most recent echo from March 2024 with EF 55 to 60% and grade 2 diastolic dysfunction  Because of chronic hypertension, patient is on midodrine  before dialysis on MWF. Blood pressure running low.  I increased frequency of midodrine  to be continued daily. Continue to monitor blood pressure  Type 2 diabetes mellitus A1c 7.9 on 05/19/2022 PTA meds- Lantus  20 units in a.m. Currently on Lantus  10 units daily and SSI/Accu-Cheks.  Blood sugar trending up.  Will give 1 additional dose of Semglee  10 units this afternoon.  Increase Semglee  to 20 units from tomorrow. Recent Labs  Lab 03/04/23 1730 03/04/23 2049 03/05/23 0432 03/05/23 1003 03/05/23 1152  GLUCAP 172* 459* 239* 244* 296*   Dementia without behavioral disturbance  Chronic, stable, baseline   Paroxysmal atrial fibrillation Cont amiodarone  Eliquis  on hold for surgery  GERD PPI   Mobility: Needs PT eval postprocedure  Goals of care   Code Status: Full Code    DVT prophylaxis:  heparin  injection 5,000 Units Start: 03/04/23 1400 SCDs Start: 03/03/23  1402 Place and maintain sequential compression device Start: 03/02/23 1443   Antimicrobials: None Fluid: None Consultants: Orthopedics Family Communication: Daughter not at bedside today  Status: Inpatient Level of care:  Med-Surg   Patient is from: Home Needs to continue in-hospital care: POD 1, pending PT eval Anticipated d/c to: Pending clinical course    Diet:  Diet Order             Diet Carb Modified Fluid consistency: Thin; Room service appropriate? Yes  Diet effective now                    Scheduled Meds:  (feeding supplement) PROSource Plus  30 mL Oral BID BM   acetaminophen   1,000 mg Oral TID   amiodarone   200 mg Oral Daily   atorvastatin   80 mg Oral QHS   Chlorhexidine  Gluconate Cloth  6 each Topical Q0600   darbepoetin (ARANESP ) injection - DIALYSIS  60 mcg Subcutaneous Q Fri-1800   heparin  injection (subcutaneous)  5,000 Units Subcutaneous Q8H   insulin  aspart  0-5 Units Subcutaneous QHS   insulin  aspart  0-9 Units Subcutaneous TID WC   insulin  glargine-yfgn  10 Units Subcutaneous Once   [START ON 03/06/2023] insulin  glargine-yfgn  20 Units Subcutaneous Daily   midodrine   10 mg Oral Daily   multivitamin  1 tablet Oral QHS   pantoprazole   40 mg Oral BID   senna-docusate  1 tablet Oral QHS    PRN meds: diphenhydrAMINE , menthol -cetylpyridinium **OR** phenol, metoCLOPramide  **OR** metoCLOPramide  (REGLAN ) injection, morphine  injection, oxyCODONE , polyethylene glycol   Infusions:     Antimicrobials: Anti-infectives (From admission, onward)    Start     Dose/Rate Route Frequency Ordered Stop   03/03/23 1500  ceFAZolin  (ANCEF ) IVPB 1 g/50 mL premix        1 g 100 mL/hr over 30 Minutes Intravenous  Once 03/03/23 1401 03/03/23 1502   03/03/23 0730  ceFAZolin  (ANCEF ) IVPB 2g/100 mL premix        2 g 200 mL/hr over 30 Minutes Intravenous On call to O.R. 03/03/23 0704 03/03/23 0846   03/03/23 0712  ceFAZolin  (ANCEF ) 2-4 GM/100ML-% IVPB       Note to Pharmacy: Marvin Domino E: cabinet override      03/03/23 0712 03/03/23 0914       Objective: Vitals:   03/05/23 0607 03/05/23 1014  BP: (!) 116/36 (!) 118/33  Pulse: 60 61  Resp:    Temp: 98.4 F (36.9 C) 99 F (37.2 C)  SpO2: 100% 100%    Intake/Output Summary (Last 24 hours) at 03/05/2023 1450 Last data filed at 03/04/2023 1609 Gross per 24 hour  Intake --  Output 0.5 ml  Net -0.5 ml   Filed Weights   03/02/23 0139 03/03/23 0656  Weight: 76 kg 76 kg   Weight change:  Body mass  index is 30.65 kg/m.   Physical Exam: General exam: Pleasant, elderly.  Not in distress. Skin: No rashes, lesions or ulcers. HEENT: Atraumatic, normocephalic, no obvious bleeding Lungs: Clear to auscultation bilaterally,  CVS: S1, S2, no murmur,   GI/Abd: Soft, nontender, nondistended, bowel sound present,   CNS: Alert, awake, able to answer orientation questions Psychiatry: Mood appropriate,  Extremities: No pedal edema, no calf tenderness  Data Review: I have personally reviewed the laboratory data and studies available.  F/u labs collected Unresulted Labs (From admission, onward)     Start     Ordered   03/06/23 0500  Type and screen  Once,   R        03/05/23 1447   03/04/23 0500  CBC  Daily,   R     Comments: For 3 days.   Question:  Specimen collection method  Answer:  Lab=Lab collect   03/03/23 1401            Total time spent in review of labs and imaging, patient evaluation, formulation of plan, documentation and communication with family: 45 minutes  Signed, Chapman Rota, MD [facility] 03/05/2023

## 2023-03-05 NOTE — Evaluation (Signed)
 Clinical/Bedside Swallow Evaluation Patient Details  Name: Stephanie Woodward MRN: [id number] Date of Birth: 12-01-1940  Today's Date: 03/05/2023 Time: SLP Start Time (ACUTE ONLY): 1040 SLP Stop Time (ACUTE ONLY): 1050 SLP Time Calculation (min) (ACUTE ONLY): 10 min  Past Medical History:  Past Medical History:  Diagnosis Date   Arthritis    Asthma    Atrial fibrillation (HCC)    CHF (congestive heart failure) (HCC)    CKD (chronic kidney disease), stage III (HCC)    COPD (chronic obstructive pulmonary disease) (HCC)    Diabetes mellitus    INSULIN  DEPENDENT   Dialysis patient (HCC)    Gout    Heart murmur    no issues per pt   History of kidney stones    Hyperlipemia    Hypertension    Psoriasis    Renal disorder    congenital   Single kidney    Sleep apnea    doesn't use the Cpap   Past Surgical History:  Past Surgical History:  Procedure Laterality Date   A/V FISTULAGRAM Left 10/31/2020   Procedure: A/V FISTULAGRAM;  Surgeon: Eliza Lonni RAMAN, MD;  Location: [facility] INVASIVE CV LAB;  Service: Cardiovascular;  Laterality: Left;   ABDOMINAL HYSTERECTOMY     AV FISTULA PLACEMENT Left 11/17/2018   Procedure: BRACHIO-CEPHALIC ARTERIOVENOUS (AV) FISTULA CREATION IN LEFT ARM;  Surgeon: Gretta Lonni PARAS, MD;  Location: [facility] OR;  Service: Vascular;  Laterality: Left;   CARDIOVERSION N/A 03/03/2021   Procedure: CARDIOVERSION;  Surgeon: Rolan Ezra RAMAN, MD;  Location: [facility] ENDOSCOPY;  Service: Cardiovascular;  Laterality: N/A;   CARDIOVERSION N/A 03/12/2021   Procedure: CARDIOVERSION;  Surgeon: Rolan Ezra RAMAN, MD;  Location: [facility] ENDOSCOPY;  Service: Cardiovascular;  Laterality: N/A;   CHOLECYSTECTOMY     HIP ARTHROPLASTY Right 03/03/2023   Procedure: ARTHROPLASTY BIPOLAR HIP (HEMIARTHROPLASTY) POSTERIOR;  Surgeon: Celena Sharper, MD;  Location: [facility] OR;  Service: Orthopedics;  Laterality: Right;   INSERTION OF DIALYSIS CATHETER Right 01/26/2019   Procedure: INSERTION OF DIALYSIS  CATHETER, right internal jugular;  Surgeon: Eliza Lonni RAMAN, MD;  Location: [facility] OR;  Service: Vascular;  Laterality: Right;   LEFT AND RIGHT HEART CATHETERIZATION WITH CORONARY ANGIOGRAM N/A 11/29/2013   Procedure: LEFT AND RIGHT HEART CATHETERIZATION WITH CORONARY ANGIOGRAM;  Surgeon: Elsie RAMAN Somerset, MD;  Location: [facility] CATH LAB;  Service: Cardiovascular;  Laterality: N/A;   LEFT HEART CATH AND CORONARY ANGIOGRAPHY N/A 02/24/2021   Procedure: LEFT HEART CATH AND CORONARY ANGIOGRAPHY;  Surgeon: Rolan Ezra RAMAN, MD;  Location: [facility] INVASIVE CV LAB;  Service: Cardiovascular;  Laterality: N/A;   PERIPHERAL VASCULAR INTERVENTION Left 10/31/2020   Procedure: PERIPHERAL VASCULAR INTERVENTION;  Surgeon: Eliza Lonni RAMAN, MD;  Location: [facility] INVASIVE CV LAB;  Service: Cardiovascular;  Laterality: Left;   RIGHT HEART CATH N/A 11/23/2017   Procedure: RIGHT HEART CATH;  Surgeon: Rolan Ezra RAMAN, MD;  Location: [facility] INVASIVE CV LAB;  Service: Cardiovascular;  Laterality: N/A;   RIGHT/LEFT HEART CATH AND CORONARY ANGIOGRAPHY N/A 06/15/2019   Procedure: RIGHT/LEFT HEART CATH AND CORONARY ANGIOGRAPHY;  Surgeon: Rolan Ezra RAMAN, MD;  Location: [facility] INVASIVE CV LAB;  Service: Cardiovascular;  Laterality: N/A;   HPI:  Patient is an 83 y.o. female with PMH: dysphagia, CKD stage 3, HFpEF, a-fib, ESRD on MWF HD, DM-2, dementia without behavioral disturbance. She presented to the ED on 03/02/23 following a fall with c/o right sided hip pain. She underwent hemiarthroplasty of right hip on 1/9. She was evaluated for dysphagia in December  of 2023 and per FEES (Fiberoptic Endoscopic Evaluation of Swallowing) which reported normal pharyngeal phase of swallowing and indicated a cognitive-based dysphagia leading to some oral holding. SLP swallow evaluation ordered on 1/10.    Assessment / Plan / Recommendation  Clinical Impression  Patient is not currently presenting with clinical s/s of dysphagia as per this bedside swallow  evaluation. Her primary complaint was difficulty feeding herself and reaching all the items on her food tray secondary to UE weakness. She did indicate that she had used adaptive utensils with OT in the past and this did help. Through discussion, SLP learned that she was referring to red foam tubing for utensil handles. (SLP retrieved some of this for patient to use and when she saw it she commented, that's what I used. She did not present with any overt s/s aspiration when drinking thin liquids via straw sips and swallow initiation was timely. When SLP discussed mechanical soft solids diet that she had been on during admission last year, she told SLP that although she does not have dentures she can eat all types of foods/consistencies. SLP is not recommending further skilled intervention but is recommending she have setup assistance with meals. Please reorder SLP if any concerns for coughing/choking/aspirating with PO intake. SLP Visit Diagnosis: Dysphagia, unspecified (R13.10)    Aspiration Risk  Mild aspiration risk    Diet Recommendation Regular;Thin liquid    Liquid Administration via: Cup;Straw Medication Administration: Other (Comment) (as tolerated) Supervision: Patient able to self feed Compensations: Slow rate;Small sips/bites;Minimize environmental distractions Postural Changes: Seated upright at 90 degrees;Remain upright for at least 30 minutes after po intake    Other  Recommendations Oral Care Recommendations: Oral care BID    Recommendations for follow up therapy are one component of a multi-disciplinary discharge planning process, led by the attending physician.  Recommendations may be updated based on patient status, additional functional criteria and insurance authorization.  Follow up Recommendations No SLP follow up      Assistance Recommended at Discharge    Functional Status Assessment Patient has not had a recent decline in their functional status  Frequency and  Duration     N/A       Prognosis   N/A     Swallow Study   General Date of Onset: 03/04/23 HPI: Patient is an 83 y.o. female with PMH: dysphagia, CKD stage 3, HFpEF, a-fib, ESRD on MWF HD, DM-2, dementia without behavioral disturbance. She presented to the ED on 03/02/23 following a fall with c/o right sided hip pain. She underwent hemiarthroplasty of right hip on 1/9. She was evaluated for dysphagia in December of 2023 and per FEES (Fiberoptic Endoscopic Evaluation of Swallowing) which reported normal pharyngeal phase of swallowing and indicated a cognitive-based dysphagia leading to some oral holding. SLP swallow evaluation ordered on 1/10. Type of Study: Bedside Swallow Evaluation Previous Swallow Assessment: in 2023; BSE, FEES Diet Prior to this Study: Regular;Thin liquids (Level 0) Temperature Spikes Noted: No Respiratory Status: Nasal cannula History of Recent Intubation: Yes Total duration of intubation (days):  (for surgery only, extubated in OR) Date extubated: 03/03/23 Behavior/Cognition: Alert;Cooperative;Pleasant mood Oral Cavity Assessment: Within Functional Limits Oral Care Completed by SLP: No Oral Cavity - Dentition: Edentulous;Other (Comment) (reports she does not wear dentures) Vision: Functional for self-feeding Self-Feeding Abilities: Able to feed self Patient Positioning: Upright in bed Baseline Vocal Quality: Normal Volitional Cough: Strong Volitional Swallow: Able to elicit    Oral/Motor/Sensory Function Overall Oral Motor/Sensory Function: Within functional limits  Ice Chips     Thin Liquid Thin Liquid: Within functional limits Presentation: Straw;Self Fed    Nectar Thick     Honey Thick     Puree Puree: Not tested   Solid     Solid: Not tested     Norleen IVAR Blase, MA, CCC-SLP Speech Therapy

## 2023-03-05 NOTE — Progress Notes (Signed)
     Subjective:  Patient reports pain as mild.  Doing ok but having trouble with basic things like feeding herself.    Objective:   VITALS:   Vitals:   03/04/23 2022 03/04/23 2211 03/05/23 0100 03/05/23 0607  BP: (!) 73/23 (!) 107/34 (!) 110/42 (!) 116/36  Pulse: 72  67 60  Resp: 14     Temp: 98.5 F (36.9 C)  99 F (37.2 C) 98.4 F (36.9 C)  TempSrc: Oral  Oral Oral  SpO2:   100% 100%  Weight:      Height:        Neurologically intact Dorsiflexion/Plantar flexion intact Incision: no drainage   Lab Results  Component Value Date   WBC 13.4 (H) 03/05/2023   HGB 8.6 (L) 03/05/2023   HCT 26.7 (L) 03/05/2023   MCV 98.5 03/05/2023   PLT 161 03/05/2023   BMET    Component Value Date/Time   NA 136 03/05/2023 0424   K 4.2 03/05/2023 0424   CL 97 (L) 03/05/2023 0424   CO2 27 03/05/2023 0424   GLUCOSE 185 (H) 03/05/2023 0424   BUN 30 (H) 03/05/2023 0424   CREATININE 4.59 (H) 03/05/2023 0424   CALCIUM  8.6 (L) 03/05/2023 0424   GFRNONAA 9 (L) 03/05/2023 0424     Assessment/Plan: 1 Day Post-Op   Principal Problem:   Closed displaced fracture of right femoral neck (HCC) Active Problems:   Type 2 diabetes mellitus, controlled, with renal complications (HCC)   Chronic combined systolic and diastolic heart failure (HCC)   ESRD on hemodialysis (HCC)   Hypotension   Paroxysmal atrial fibrillation (HCC)   Dementia without behavioral disturbance (HCC)   Weightbearing: WBAT RLE ROM: posterior hip precautions R hip  Insicional and dressing care: Daily dressing changes with mepilex or 4x4 gauze and tape starting on 03/05/2022 Pain management: multimodal  PI:ezmpne abx  Impediments to Fracture Healing: CKD, DM, fragility fracture Bone Health/Optimization: metabolic bone disease per renal    VTE prophylaxis:  ok to resume home eliquis     Dispo: pending.  Await PT/OT recs, may need SNF    Follow - up plan: 2 weeks with Dr. Celena Chew Stephanie Woodward 03/05/2023, 8:08  AM   Chew [place], MD Cell [phone]

## 2023-03-06 DIAGNOSIS — S72001A Fracture of unspecified part of neck of right femur, initial encounter for closed fracture: Secondary | ICD-10-CM | POA: Diagnosis not present

## 2023-03-06 LAB — CBC
HCT: 30.8 % — ABNORMAL LOW (ref 36.0–46.0)
Hemoglobin: 9.7 g/dL — ABNORMAL LOW (ref 12.0–15.0)
MCH: 31.3 pg (ref 26.0–34.0)
MCHC: 31.5 g/dL (ref 30.0–36.0)
MCV: 99.4 fL (ref 80.0–100.0)
Platelets: 189 10*3/uL (ref 150–400)
RBC: 3.1 MIL/uL — ABNORMAL LOW (ref 3.87–5.11)
RDW: 14.9 % (ref 11.5–15.5)
WBC: 13.3 10*3/uL — ABNORMAL HIGH (ref 4.0–10.5)
nRBC: 0.2 % (ref 0.0–0.2)

## 2023-03-06 LAB — GLUCOSE, CAPILLARY
Glucose-Capillary: 192 mg/dL — ABNORMAL HIGH (ref 70–99)
Glucose-Capillary: 108 mg/dL — ABNORMAL HIGH (ref 70–99)
Glucose-Capillary: 242 mg/dL — ABNORMAL HIGH (ref 70–99)
Glucose-Capillary: 174 mg/dL — ABNORMAL HIGH (ref 70–99)

## 2023-03-06 LAB — TYPE AND SCREEN
ABO/RH(D): A POS
Antibody Screen: NEGATIVE

## 2023-03-06 MED ORDER — CHLORHEXIDINE GLUCONATE CLOTH 2 % EX PADS: 6.0000 | MEDICATED_PAD | Freq: Every day | CUTANEOUS | Status: DC

## 2023-03-06 MED ORDER — POLYETHYLENE GLYCOL 3350 17 G PO PACK
17.0000 g | PACK | Freq: Two times a day (BID) | ORAL | Status: DC
  Administered 2023-03-06 – 2023-03-08 (×2): 17 g via ORAL
  Filled 2023-03-06 (×5): qty 1

## 2023-03-06 MED ORDER — APIXABAN 2.5 MG PO TABS
2.5000 mg | ORAL_TABLET | Freq: Two times a day (BID) | ORAL | Status: DC
  Administered 2023-03-06 – 2023-03-09 (×7): 2.5 mg via ORAL
  Filled 2023-03-06 (×7): qty 1

## 2023-03-06 MED ORDER — MELATONIN 3 MG PO TABS
3.0000 mg | ORAL_TABLET | Freq: Every day | ORAL | Status: DC
  Administered 2023-03-06 – 2023-03-11 (×6): 3 mg via ORAL
  Filled 2023-03-06 (×6): qty 1

## 2023-03-06 NOTE — TOC Initial Note (Signed)
 Transition of Care [facility]) - Initial/Assessment Note    Patient Details  Name: Stephanie Woodward MRN: [id number] Date of Birth: February 15, 1941  Transition [facility]) CM/SW Contact:    Darin JULIANNA Bend, LCSW Phone Number: 03/06/2023, 12:14 PM  Clinical Narrative:                 CSW followed-up disposition recommendations (SNF placement).  CSW completed initial TOC work-up/assessment as noted by the following below.   CSW spoke with the patient at the bedside to review SNF referral process per clinical recommendations and assessed the pt's SNF preference.   The CSW spoke with the patient's daughter Corean and Reena about the patient's disposition.   NOTE The daughters want a clinical care meeting with the providers and CSW on Monday regarding the patient's care and referral for SNF.    CSW referral efforts to support the patient's disposition FL2:  PASRR:  SNF referrals:   TOC Disposition follow-up needs  Please provide the patient or natural support with bed-offer updates.  Please continue with SNF placement efforts. Please update the clinical team to SNF placement efforts:  No other needs identified by this clinical research associate currently. Patient needs and current disposition to be followed by    Expected Discharge Plan: Skilled Nursing Facility Barriers to Discharge: Continued Medical Work up   Patient Goals and CMS Choice     Choice offered to / list presented to : Adult Children      Expected Discharge Plan and Services In-house Referral: Clinical Social Work   Post Acute Care Choice: Skilled Nursing Facility Living arrangements for the past 2 months: Apartment                                      Prior Living Arrangements/Services Living arrangements for the past 2 months: Apartment Lives with:: Other (Comment), Self   Do you feel safe going back to the place where you live?: Yes      Need for Family Participation in Patient Care: Yes (Comment) Care giver  support system in place?: Yes (comment)   Criminal Activity/Legal Involvement Pertinent to Current Situation/Hospitalization: No - Comment as needed  Activities of Daily Living      Permission Sought/Granted Permission sought to share information with : Case Manager, Magazine Features Editor Permission granted to share information with : Yes, Verbal Permission Granted        Permission granted to share info w Relationship: Daughters [facility] & Charlayne Vultaggio [phone]     Emotional Assessment       Orientation: : Oriented to Self, Oriented to Place Alcohol / Substance Use: Not Applicable Psych Involvement: No (comment)  Admission diagnosis:  Closed displaced fracture of right femoral neck (HCC) [S72.001A] Patient Active Problem List   Diagnosis Date Noted   Closed displaced fracture of right femoral neck (HCC) 03/02/2023   Prolonged QT interval 05/20/2022   Dementia without behavioral disturbance (HCC) 05/20/2022   Elevated troponin 05/20/2022   Chest pain 05/19/2022   Coronary artery disease 10/20/2021   Pressure injury of skin 05/02/2021   Paroxysmal atrial fibrillation (HCC) 05/01/2021   GERD without esophagitis 05/01/2021   Hypotension 02/26/2021   Malnutrition of moderate degree 02/21/2021   Acute on chronic diastolic CHF (congestive heart failure) (HCC) 02/17/2021   ESRD on hemodialysis (HCC) 02/17/2021   Nausea without vomiting 05/16/2020   Atrial fibrillation (HCC) 10/31/2019   Secondary hypercoagulable  state (HCC) 10/31/2019   Type 2 diabetes mellitus with hyperglycemia, with long-term current use of insulin  (HCC) 02/02/2019   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 (HCC) 02/02/2019   Type 2 diabetes mellitus with stage 4 chronic kidney disease, with long-term current use of insulin  (HCC) 02/02/2019   Acute renal failure superimposed on chronic kidney disease (HCC) 01/24/2019   Leukocytosis 01/24/2019   Hypokalemia  01/24/2019   Atrial fibrillation, chronic (HCC) 01/24/2019   Chronic anticoagulation 01/24/2019   Chronic kidney disease (CKD), stage IV (severe) (HCC) 10/31/2018   Acute on chronic combined systolic and diastolic CHF (congestive heart failure) (HCC) 11/23/2017   Paroxysmal atrial fibrillation with RVR (HCC) 04/08/2016   Acute on chronic renal failure (HCC) 03/09/2016   Diabetes mellitus with complication (HCC) 03/09/2016   Foot pain, bilateral 03/09/2016   Renal disorder    Bradycardia    CHF (congestive heart failure) (HCC) 02/06/2014   Cellulitis of right lower extremity 02/06/2014   Essential hypertension 02/06/2014   Hyperlipidemia 02/06/2014   Uncontrolled type 2 diabetes mellitus with hypoglycemia (HCC) 02/06/2014   Cellulitis 02/06/2014   Hypoxemia 12/07/2013   Type 2 diabetes mellitus, controlled, with renal complications (HCC) 12/07/2013   Kidney disease, chronic, stage III (GFR 30-59 ml/min) (HCC) 12/07/2013   Pulmonary hypertension (HCC) 12/07/2013   Chronic combined systolic and diastolic heart failure (HCC)    SOB (shortness of breath) 12/05/2013   COPD (chronic obstructive pulmonary disease) (HCC)    Gout 11/11/2009   Obesity 11/11/2009   Hypertensive heart disease    PCP:  [facility]:   [facility] - [place], [place] - [address] [place] [address] Phone: [phone] Fax: [phone]  [id number] [place], [place] - [address] [place] [address] Phone: [phone] Fax: [phone]     Social Drivers of Health (SDOH) Social History: SDOH Screenings   Food Insecurity: No Food Insecurity (03/02/2023)  Housing: Low Risk  (03/02/2023)  Transportation Needs: No Transportation Needs (03/02/2023)  Utilities: Not At Risk (03/02/2023)  Depression (PHQ2-9): Low Risk  (12/03/2019)  Financial Resource Strain: Low Risk  (02/27/2021)  Social Connections: Moderately  Isolated (03/02/2023)  Tobacco Use: Medium Risk (03/03/2023)   SDOH Interventions:     Readmission Risk Interventions     No data to display

## 2023-03-06 NOTE — Plan of Care (Signed)
  Problem: Coping: Goal: Ability to adjust to condition or change in health will improve Outcome: Progressing   Problem: Fluid Volume: Goal: Ability to maintain a balanced intake and output will improve Outcome: Progressing   Problem: Education: Goal: Knowledge of General Education information will improve Description: Including pain rating scale, medication(s)/side effects and non-pharmacologic comfort measures Outcome: Progressing   Problem: Health Behavior/Discharge Planning: Goal: Ability to manage health-related needs will improve Outcome: Progressing

## 2023-03-06 NOTE — Progress Notes (Signed)
 PROGRESS NOTE  Stephanie Woodward  DOB: 09-Nov-1940  PCP: [facility] FMW:[id number]  DOA: 03/02/2023  LOS: 4 days  Hospital Day: 5  Brief narrative: Stephanie Woodward is a 83 y.o. female with PMH significant for ESRD HD MWF, dementia with behavioral disturbance, DM2, HTN, HLD, CHF, A-fib on Eliquis , CKD, COPD, obesity, sleep apnea 1/8, patient was brought to the ED from home after a fall..  Patient was apparently trying to roll over in bed when she slipped out falling to the floor on her buttocks.  She was on the floor for 20 minutes until her daughter arrived.  Complained of right hip pain.  EMS was called and patient was brought to the ED. At baseline, she uses a wheelchair for mobility.  X-ray showed closed displaced fracture of right femoral neck Admitted to [facility] Dr. Sharl was consulted for surgical fixation 1/9, underwent right hip hemiarthroplasty  Subjective: Patient was seen and examined this morning.   Propped up in bed.  Not in distress.  Disoriented this morning.  Thinks he is at home but able to be reoriented. Daughter at bedside.  I had a long conversation with 2 daughters concerned about patient's delirium.  Assessment and plan: Closed displaced fracture of right femoral neck S/p right hip hemiarthroplasty -1/9 Dr. Celena Secondary to a fall from bed Imaging and procedure as above Pain control with scheduled Tylenol , as needed Norco, as needed IV morphine  Bowel regimen -currently on Senokot daily, MiraLAX  as needed.  No bowel movement in several days.  Ordered for MiraLAX  twice daily and 1 dose of enema. DVT prophylaxis -hemoglobin stable.  Resume Eliquis  today PT/OT eval obtained.  SNF recommended.  Family is also thinking about discharging home if arrangements can be made in 1 to 2 days  Acute blood loss anemia Baseline hemoglobin above 12.  Post op, hemoglobin is downtrending secondary to blood loss from fracture and  surgery Hemoglobin down by 1 in the last 24 hours.  Blood pressure down as well. No bleeding elsewhere. Continue to monitor. Eliquis  on hold.  On subcu heparin  for DVT prophylaxis Repeat H&H and type and screen tomorrow. Recent Labs    04/15/22 1435 05/19/22 2150 03/02/23 0234 03/03/23 0740 03/04/23 0611 03/05/23 0424 03/06/23 0450  HGB  --    < > 12.3 12.9 9.7* 8.6* 9.7*  MCV  --    < > 100.0  --  99.0 98.5 99.4  VITAMINB12 818  --   --   --   --   --   --    < > = values in this interval not displayed.   ESRD HD MWF Nephro following. 1/10 underwent fistulogram.  No stenotic segment found.  Chronic combined systolic and diastolic CHF  Hypotension Most recent echo from March 2024 with EF 55 to 60% and grade 2 diastolic dysfunction  Because of chronic hypertension, patient is on midodrine  before dialysis on MWF. Blood pressure is better after midodrine  frequency was increased to daily  Continue to monitor blood pressure  Type 2 diabetes mellitus A1c 7.9 on 05/19/2022 PTA meds- Lantus  20 units in a.m. Currently on Lantus  20 units since this morning.  Blood sugar improving.  SSI/Accu-Cheks.  Recent Labs  Lab 03/05/23 1152 03/05/23 1711 03/05/23 2005 03/06/23 0623 03/06/23 1123  GLUCAP 296* 222* 244* 192* 108*   Acute metabolic encephalopathy Dementia without behavioral disturbance  Resume likely due to hospitalization, pain meds in the setting of vasculopathy, ESRD HD.  She  has similar history from past. Continue reorientation effort.  Continue melatonin nightly.   Paroxysmal atrial fibrillation Cont amiodarone  Eliquis  on hold for surgery  GERD PPI   Mobility: Needs PT eval postprocedure  Goals of care   Code Status: Full Code    DVT prophylaxis:  apixaban  (ELIQUIS ) tablet 2.5 mg Start: 03/06/23 1000 SCDs Start: 03/03/23 1402 Place and maintain sequential compression device Start: 03/02/23 1443 apixaban  (ELIQUIS ) tablet 2.5 mg   Antimicrobials:  None Fluid: None Consultants: Orthopedics Family Communication: Daughters at bedside today  Status: Inpatient Level of care:  Med-Surg   Patient is from: Home Needs to continue in-hospital care: Pending SNF Anticipated d/c to: Pending SNF    Diet:  Diet Order             Diet Carb Modified Fluid consistency: Thin; Room service appropriate? Yes  Diet effective now                   Scheduled Meds:  (feeding supplement) PROSource Plus  30 mL Oral BID BM   acetaminophen   1,000 mg Oral TID   amiodarone   200 mg Oral Daily   apixaban   2.5 mg Oral BID   atorvastatin   80 mg Oral QHS   Chlorhexidine  Gluconate Cloth  6 each Topical Q0600   darbepoetin (ARANESP ) injection - DIALYSIS  60 mcg Subcutaneous Q Fri-1800   insulin  aspart  0-5 Units Subcutaneous QHS   insulin  aspart  0-9 Units Subcutaneous TID WC   insulin  glargine-yfgn  20 Units Subcutaneous Daily   melatonin  3 mg Oral QHS   midodrine   10 mg Oral Daily   multivitamin  1 tablet Oral QHS   pantoprazole   40 mg Oral BID   polyethylene glycol  17 g Oral BID   senna-docusate  1 tablet Oral QHS    PRN meds: diphenhydrAMINE , menthol -cetylpyridinium **OR** phenol, metoCLOPramide  **OR** metoCLOPramide  (REGLAN ) injection   Infusions:     Antimicrobials: Anti-infectives (From admission, onward)    Start     Dose/Rate Route Frequency Ordered Stop   03/03/23 1500  ceFAZolin  (ANCEF ) IVPB 1 g/50 mL premix        1 g 100 mL/hr over 30 Minutes Intravenous  Once 03/03/23 1401 03/03/23 1502   03/03/23 0730  ceFAZolin  (ANCEF ) IVPB 2g/100 mL premix        2 g 200 mL/hr over 30 Minutes Intravenous On call to O.R. 03/03/23 0704 03/03/23 0846   03/03/23 0712  ceFAZolin  (ANCEF ) 2-4 GM/100ML-% IVPB       Note to Pharmacy: Marvin Domino E: cabinet override      03/03/23 0712 03/03/23 0914       Objective: Vitals:   03/06/23 0713 03/06/23 1012  BP:  (!) 128/48  Pulse:  63  Resp:    Temp:  99.6 F (37.6 C)  SpO2: 96%  98%   No intake or output data in the 24 hours ending 03/06/23 1417  Filed Weights   03/02/23 0139 03/03/23 0656  Weight: 76 kg 76 kg   Weight change:  Body mass index is 30.65 kg/m.   Physical Exam: General exam: Pleasant, elderly.  Not in distress. Skin: No rashes, lesions or ulcers. HEENT: Atraumatic, normocephalic, no obvious bleeding Lungs: Clear to auscultation bilaterally,  CVS: S1, S2, no murmur,   GI/Abd: Soft, nontender, nondistended, bowel sound present,   CNS: Alert, awake, not oriented to time or place today Psychiatry: Mood appropriate,  Extremities: No pedal edema, no calf tenderness  Data  Review: I have personally reviewed the laboratory data and studies available.  F/u labs collected Unresulted Labs (From admission, onward)     Start     Ordered   Signed and Held  Renal function panel  Once,   R       Question:  Specimen collection method  Answer:  Lab=Lab collect   Signed and Held   Signed and Held  CBC  Once,   R       Question:  Specimen collection method  Answer:  Lab=Lab collect   Signed and Held            Total time spent in review of labs and imaging, patient evaluation, formulation of plan, documentation and communication with family: 45 minutes  Signed, Chapman Rota, MD [facility] 03/06/2023

## 2023-03-06 NOTE — Progress Notes (Addendum)
 [facility] Progress Note   Subjective:   Patient seen and examined at bedside. Uncomfortable, wants to be repositioned, Nurse made aware.  Denies CP, SOB, abdominal pain and n/v/d.  Oriented x3 but a little confused.  Can not remember the password to unlock her phone this AM.    Objective Vitals:   03/05/23 2017 03/06/23 0300 03/06/23 0713 03/06/23 1012  BP: (!) 111/37 (!) 108/37  (!) 128/48  Pulse: (!) 55 (!) 59  63  Resp:  18    Temp: 98.3 F (36.8 C) 98.2 F (36.8 C)  99.6 F (37.6 C)  TempSrc: Oral Oral  Oral  SpO2: 95% 94% 96% 98%  Weight:      Height:       Physical Exam General:chronically ill appearing, elderly female in NAD Heart:RRR Lungs:CTAB, nml WOB on 2L O2 Abdomen:soft, NTND Extremities:trace LE edema+b/t Dialysis Access: LU AVF +b/t   Filed Weights   03/02/23 0139 03/03/23 0656  Weight: 76 kg 76 kg   No intake or output data in the 24 hours ending 03/06/23 1131  Additional Objective Labs: Basic Metabolic Panel: Recent Labs  Lab 03/02/23 0234 03/03/23 0740 03/04/23 0611 03/05/23 0424  NA 138 135 135 136  K 4.0 4.4 4.4 4.2  CL 96* 95* 96* 97*  CO2 26  --  26 27  GLUCOSE 71 158* 211* 185*  BUN 29* 21 38* 30*  CREATININE 6.94* 5.30* 6.75* 4.59*  CALCIUM  8.9  --  9.1 8.6*  PHOS 4.9*  --   --   --    Liver Function Tests: Recent Labs  Lab 03/02/23 0234  ALBUMIN  3.2*   CBC: Recent Labs  Lab 03/02/23 0234 03/03/23 0740 03/04/23 0611 03/05/23 0424 03/06/23 0450  WBC 12.2*  --  14.2* 13.4* 13.3*  NEUTROABS 10.1*  --   --   --   --   HGB 12.3   < > 9.7* 8.6* 9.7*  HCT 38.5   < > 30.3* 26.7* 30.8*  MCV 100.0  --  99.0 98.5 99.4  PLT 197  --  174 161 189   < > = values in this interval not displayed.   Blood Culture    Component Value Date/Time   SDES BLOOD LEFT HAND 05/02/2021 0201   SPECREQUEST  05/02/2021 0201    BOTTLES DRAWN AEROBIC AND ANAEROBIC Blood Culture results may not be optimal due to an inadequate  volume of blood received in culture bottles   CULT  05/02/2021 0201    NO GROWTH 5 DAYS Performed at [facility], [address] [place], [place] [address]    REPTSTATUS 05/07/2021 FINAL 05/02/2021 0201     CBG: Recent Labs  Lab 03/05/23 1003 03/05/23 1152 03/05/23 1711 03/05/23 2005 03/06/23 0623  GLUCAP 244* 296* 222* 244* 192*    Medications:   (feeding supplement) PROSource Plus  30 mL Oral BID BM   acetaminophen   1,000 mg Oral TID   amiodarone   200 mg Oral Daily   apixaban   2.5 mg Oral BID   atorvastatin   80 mg Oral QHS   Chlorhexidine  Gluconate Cloth  6 each Topical Q0600   darbepoetin (ARANESP ) injection - DIALYSIS  60 mcg Subcutaneous Q Fri-1800   insulin  aspart  0-5 Units Subcutaneous QHS   insulin  aspart  0-9 Units Subcutaneous TID WC   insulin  glargine-yfgn  20 Units Subcutaneous Daily   melatonin  3 mg Oral QHS   midodrine   10 mg Oral Daily  multivitamin  1 tablet Oral QHS   pantoprazole   40 mg Oral BID   polyethylene glycol  17 g Oral BID   senna-docusate  1 tablet Oral QHS    Dialysis Orders: [facility] M[facility]  4h   350/600   77.3kg   2/2 bath   AVF LUA  Heparin  none - hectorol  9 mcg  - last Hb 12.0   Assessment/Plan: Right hip fracture: fall at home, unipolar hemiarthroplasty of right hip on 1/9 ESRD: MWF HD.  Next HD on 03/06/22.  HTN/volume: BP stable.  Gets midodrine  pre-hd. Under dry weight,  possible weight loss. Does not appear grossly overloaded.  Plan for UF as tolerated.   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   Aranesp  60 mcg given 03/04/23.  Secondary HPTH: Last Ca and phos good. Not on binder. Hectorol  9 mcg Nutrition: Albumin  3.2. Carb modified diet. Continue supplements Dementia: chronic, stable, baseline DM: continue with lantus  PAF: continue with amio. Eliquis  on hold Combined diastolic/systolic CHF: EF at 55-60%, still G3DD Prolonged bleeding of fistula: fistulagram 03/03/22. No evidence of stenotic segments, inflow and centrals widely  patient, cephalic vein fistula is orthogonal to rest of cephalic vein, need to map out cephalic vein to prevent cannulating close to orthogonal point  [employer]iz [employer], PA-C [facility] 03/06/2023,11:31 AM  LOS: 4 days

## 2023-03-06 NOTE — Plan of Care (Signed)
  Problem: Education: Goal: Ability to describe self-care measures that may prevent or decrease complications (Diabetes Survival Skills Education) will improve Outcome: Progressing Goal: Individualized Educational Video(s) Outcome: Progressing   Problem: Coping: Goal: Ability to adjust to condition or change in health will improve Outcome: Progressing   Problem: Fluid Volume: Goal: Ability to maintain a balanced intake and output will improve Outcome: Progressing   Problem: Health Behavior/Discharge Planning: Goal: Ability to identify and utilize available resources and services will improve Outcome: Progressing Goal: Ability to manage health-related needs will improve Outcome: Progressing   Problem: Metabolic: Goal: Ability to maintain appropriate glucose levels will improve Outcome: Progressing   Problem: Nutritional: Goal: Maintenance of adequate nutrition will improve Outcome: Progressing Goal: Progress toward achieving an optimal weight will improve Outcome: Progressing   Problem: Skin Integrity: Goal: Risk for impaired skin integrity will decrease Outcome: Progressing   Problem: Tissue Perfusion: Goal: Adequacy of tissue perfusion will improve Outcome: Progressing   Problem: Education: Goal: Knowledge of General Education information will improve Description: Including pain rating scale, medication(s)/side effects and non-pharmacologic comfort measures Outcome: Progressing   Problem: Health Behavior/Discharge Planning: Goal: Ability to manage health-related needs will improve Outcome: Progressing   Problem: Clinical Measurements: Goal: Ability to maintain clinical measurements within normal limits will improve Outcome: Progressing Goal: Will remain free from infection Outcome: Progressing Goal: Diagnostic test results will improve Outcome: Progressing Goal: Respiratory complications will improve Outcome: Progressing Goal: Cardiovascular complication will  be avoided Outcome: Progressing   Problem: Activity: Goal: Risk for activity intolerance will decrease Outcome: Progressing   Problem: Nutrition: Goal: Adequate nutrition will be maintained Outcome: Progressing   Problem: Coping: Goal: Level of anxiety will decrease Outcome: Progressing   Problem: Elimination: Goal: Will not experience complications related to bowel motility Outcome: Progressing Goal: Will not experience complications related to urinary retention Outcome: Progressing   Problem: Pain Management: Goal: General experience of comfort will improve Outcome: Progressing   Problem: Safety: Goal: Ability to remain free from injury will improve Outcome: Progressing   Problem: Skin Integrity: Goal: Risk for impaired skin integrity will decrease Outcome: Progressing   Problem: Education: Goal: Verbalization of understanding the information provided (i.e., activity precautions, restrictions, etc) will improve Outcome: Progressing Goal: Individualized Educational Video(s) Outcome: Progressing   Problem: Activity: Goal: Ability to ambulate and perform ADLs will improve Outcome: Progressing   Problem: Clinical Measurements: Goal: Postoperative complications will be avoided or minimized Outcome: Progressing   Problem: Self-Concept: Goal: Ability to maintain and perform role responsibilities to the fullest extent possible will improve Outcome: Progressing   Problem: Pain Management: Goal: Pain level will decrease Outcome: Progressing

## 2023-03-06 NOTE — NC FL2 (Signed)
 [place]  MEDICAID FL2 LEVEL OF CARE FORM     IDENTIFICATION  Patient Name: Stephanie Woodward Birthdate: 1940-05-05 Sex: female Admission Date (Current Location): 03/02/2023  [facility] and [place] Number:      Facility and Address:  [name], [address], [place], [place] [address]      Provider Number: [id number]  Attending Physician Name and Address:  Arlice Reichert, MD  Relative Name and Phone Number:  [facility] & Sanaii Caporaso [phone]    Current Level of Care: Hospital Recommended Level of Care: Skilled Nursing Facility Prior Approval Number:    Date Approved/Denied: 03/06/23 PASRR Number: [id number]  Discharge Plan: SNF    Current Diagnoses: Patient Active Problem List   Diagnosis Date Noted   Closed displaced fracture of right femoral neck (HCC) 03/02/2023   Prolonged QT interval 05/20/2022   Dementia without behavioral disturbance (HCC) 05/20/2022   Elevated troponin 05/20/2022   Chest pain 05/19/2022   Coronary artery disease 10/20/2021   Pressure injury of skin 05/02/2021   Paroxysmal atrial fibrillation (HCC) 05/01/2021   GERD without esophagitis 05/01/2021   Hypotension 02/26/2021   Malnutrition of moderate degree 02/21/2021   Acute on chronic diastolic CHF (congestive heart failure) (HCC) 02/17/2021   ESRD on hemodialysis (HCC) 02/17/2021   Nausea without vomiting 05/16/2020   Atrial fibrillation (HCC) 10/31/2019   Secondary hypercoagulable state (HCC) 10/31/2019   Type 2 diabetes mellitus with hyperglycemia, with long-term current use of insulin  (HCC) 02/02/2019   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 (HCC) 02/02/2019   Type 2 diabetes mellitus with stage 4 chronic kidney disease, with long-term current use of insulin  (HCC) 02/02/2019   Acute renal failure superimposed on chronic kidney disease (HCC) 01/24/2019   Leukocytosis 01/24/2019   Hypokalemia 01/24/2019   Atrial fibrillation,  chronic (HCC) 01/24/2019   Chronic anticoagulation 01/24/2019   Chronic kidney disease (CKD), stage IV (severe) (HCC) 10/31/2018   Acute on chronic combined systolic and diastolic CHF (congestive heart failure) (HCC) 11/23/2017   Paroxysmal atrial fibrillation with RVR (HCC) 04/08/2016   Acute on chronic renal failure (HCC) 03/09/2016   Diabetes mellitus with complication (HCC) 03/09/2016   Foot pain, bilateral 03/09/2016   Renal disorder    Bradycardia    CHF (congestive heart failure) (HCC) 02/06/2014   Cellulitis of right lower extremity 02/06/2014   Essential hypertension 02/06/2014   Hyperlipidemia 02/06/2014   Uncontrolled type 2 diabetes mellitus with hypoglycemia (HCC) 02/06/2014   Cellulitis 02/06/2014   Hypoxemia 12/07/2013   Type 2 diabetes mellitus, controlled, with renal complications (HCC) 12/07/2013   Kidney disease, chronic, stage III (GFR 30-59 ml/min) (HCC) 12/07/2013   Pulmonary hypertension (HCC) 12/07/2013   Chronic combined systolic and diastolic heart failure (HCC)    SOB (shortness of breath) 12/05/2013   COPD (chronic obstructive pulmonary disease) (HCC)    Gout 11/11/2009   Obesity 11/11/2009   Hypertensive heart disease     Orientation RESPIRATION BLADDER Height & Weight     Self, Situation, Place  O2 (Nasal.C) Incontinent Weight: 167 lb 8.8 oz (76 kg) Height:  5' 2 (157.5 cm)  BEHAVIORAL SYMPTOMS/MOOD NEUROLOGICAL BOWEL NUTRITION STATUS      Continent Diet (Carb Modified Fluid consistency)  AMBULATORY STATUS COMMUNICATION OF NEEDS Skin   Limited Assist Verbally Normal                       Personal Care Assistance Level of Assistance  Functional Limitations Info             SPECIAL CARE FACTORS FREQUENCY  PT (By licensed PT), OT (By licensed OT)     PT Frequency: 5x OT Frequency: 3x            Contractures      Additional Factors Info  Code Status Code Status Info: Full             Current  Medications (03/06/2023):  This is the current hospital active medication list Current Facility-Administered Medications  Medication Dose Route Frequency Provider Last Rate Last Admin   (feeding supplement) PROSource Plus liquid 30 mL  30 mL Oral BID BM Dickinson, Belvie DEL, NP   30 mL at 03/06/23 1000   acetaminophen  (TYLENOL ) tablet 1,000 mg  1,000 mg Oral TID Dahal, Binaya, MD   1,000 mg at 03/06/23 0947   amiodarone  (PACERONE ) tablet 200 mg  200 mg Oral Daily Deward Eck, PA-C   200 mg at 03/06/23 1000   apixaban  (ELIQUIS ) tablet 2.5 mg  2.5 mg Oral BID Dahal, Binaya, MD   2.5 mg at 03/06/23 1000   atorvastatin  (LIPITOR ) tablet 80 mg  80 mg Oral QHS Deward Eck, PA-C   80 mg at 03/05/23 2127   Chlorhexidine  Gluconate Cloth 2 % PADS 6 each  6 each Topical Q0600 Deward Eck, PA-C   6 each at 03/06/23 9376   Darbepoetin Alfa  (ARANESP ) injection 60 mcg  60 mcg Subcutaneous Q Fri-1800 Fritz Belvie DEL, NP   60 mcg at 03/04/23 1747   diphenhydrAMINE  (BENADRYL ) injection 25 mg  25 mg Intravenous Q6H PRN Arlice Reichert, MD   25 mg at 03/03/23 1426   insulin  aspart (novoLOG ) injection 0-5 Units  0-5 Units Subcutaneous QHS Dahal, Binaya, MD   2 Units at 03/05/23 2126   insulin  aspart (novoLOG ) injection 0-9 Units  0-9 Units Subcutaneous TID WC Arlice Reichert, MD   2 Units at 03/06/23 0630   insulin  glargine-yfgn (SEMGLEE ) injection 20 Units  20 Units Subcutaneous Daily Dahal, Reichert, MD   20 Units at 03/06/23 1000   melatonin tablet 3 mg  3 mg Oral QHS Dahal, Reichert, MD       menthol -cetylpyridinium (CEPACOL) lozenge 3 mg  1 lozenge Oral PRN Deward Eck, PA-C       Or   phenol (CHLORASEPTIC) mouth spray 1 spray  1 spray Mouth/Throat PRN Deward Eck, PA-C       metoCLOPramide  (REGLAN ) tablet 5-10 mg  5-10 mg Oral Q8H PRN Deward Eck, PA-C   5 mg at 03/03/23 1425   Or   metoCLOPramide  (REGLAN ) injection 5-10 mg  5-10 mg Intravenous Q8H PRN Deward Eck, PA-C       midodrine  (PROAMATINE ) tablet 10 mg  10  mg Oral Daily Dahal, Binaya, MD   10 mg at 03/06/23 1000   multivitamin (RENA-VIT) tablet 1 tablet  1 tablet Oral QHS Deward Eck, PA-C   1 tablet at 03/05/23 2126   pantoprazole  (PROTONIX ) EC tablet 40 mg  40 mg Oral BID Deward Eck, PA-C   40 mg at 03/06/23 1000   polyethylene glycol (MIRALAX  / GLYCOLAX ) packet 17 g  17 g Oral BID Dahal, Reichert, MD       senna-docusate (Senokot-S) tablet 1 tablet  1 tablet Oral QHS Dahal, Binaya, MD   1 tablet at 03/05/23 2126     Discharge Medications: Please see discharge summary for a list of discharge medications.  Relevant Imaging Results:  Relevant  Lab Results:   Additional Information SS# [id number]  Darin JULIANNA Bend, LCSW

## 2023-03-06 NOTE — Progress Notes (Signed)
     Subjective:  Doing better today. Daughter at bedside. If she does need to go to rehab, she does not want to go to [facility]. Her family is very supportive and willing to help take care of her. She has history of severe right knee arthritis. They would like to address her right knee pain as well.   Objective:   VITALS:   Vitals:   03/05/23 1616 03/05/23 2017 03/06/23 0300 03/06/23 1012  BP: (!) 118/40 (!) 111/37 (!) 108/37 (!) 128/48  Pulse: (!) 56 (!) 55 (!) 59 63  Resp: 16  18   Temp: 98.6 F (37 C) 98.3 F (36.8 C) 98.2 F (36.8 C) 99.6 F (37.6 C)  TempSrc: Oral Oral Oral Oral  SpO2: 100% 95% 94% 98%  Weight:      Height:        Neurologically intact Dorsiflexion/Plantar flexion intact Incision: no drainage dressing CDI.    Lab Results  Component Value Date   WBC 13.3 (H) 03/06/2023   HGB 9.7 (L) 03/06/2023   HCT 30.8 (L) 03/06/2023   MCV 99.4 03/06/2023   PLT 189 03/06/2023   BMET    Component Value Date/Time   NA 136 03/05/2023 0424   K 4.2 03/05/2023 0424   CL 97 (L) 03/05/2023 0424   CO2 27 03/05/2023 0424   GLUCOSE 185 (H) 03/05/2023 0424   BUN 30 (H) 03/05/2023 0424   CREATININE 4.59 (H) 03/05/2023 0424   CALCIUM  8.6 (L) 03/05/2023 0424   GFRNONAA 9 (L) 03/05/2023 0424     Assessment/Plan: 2 Days Post-Op   Principal Problem:   Closed displaced fracture of right femoral neck (HCC) Active Problems:   Type 2 diabetes mellitus, controlled, with renal complications (HCC)   Chronic combined systolic and diastolic heart failure (HCC)   ESRD on hemodialysis (HCC)   Hypotension   Paroxysmal atrial fibrillation (HCC)   Dementia without behavioral disturbance (HCC)   Weightbearing: WBAT RLE ROM: posterior hip precautions R hip  Insicional and dressing care: Reinforce as needed  Pain management: multimodal  PI:ezmpne abx  Impediments to Fracture Healing: CKD, DM, fragility fracture Bone Health/Optimization: metabolic bone disease per renal    Right knee osteoarthritis: patients family is interested in addressing her right knee pain as well. They are interested in a possible cortisone injection. This may be done outpatient    VTE prophylaxis:  ok to resume home eliquis     Dispo: pending.  Await PT/OT recs, may need SNF. Does not want to go to [facility] if she does go to SNF. She has previously stayed with her daughter for a few weeks - this may be an option as well.    Follow - up plan: 2 weeks with Dr. Celena Fitch N Baya Lentz 03/06/2023, 10:57 AM

## 2023-03-07 ENCOUNTER — Encounter (HOSPITAL_COMMUNITY): Payer: Self-pay | Admitting: Nephrology

## 2023-03-07 DIAGNOSIS — S72001A Fracture of unspecified part of neck of right femur, initial encounter for closed fracture: Secondary | ICD-10-CM | POA: Diagnosis not present

## 2023-03-07 LAB — BASIC METABOLIC PANEL
Anion gap: 14 (ref 5–15)
BUN: 70 mg/dL — ABNORMAL HIGH (ref 8–23)
CO2: 24 mmol/L (ref 22–32)
Calcium: 9 mg/dL (ref 8.9–10.3)
Chloride: 95 mmol/L — ABNORMAL LOW (ref 98–111)
Creatinine, Ser: 8.19 mg/dL — ABNORMAL HIGH (ref 0.44–1.00)
GFR, Estimated: 5 mL/min — ABNORMAL LOW (ref >60)
Glucose, Bld: 164 mg/dL — ABNORMAL HIGH (ref 70–99)
Potassium: 5.1 mmol/L (ref 3.5–5.1)
Sodium: 133 mmol/L — ABNORMAL LOW (ref 135–145)

## 2023-03-07 LAB — CBC WITH DIFFERENTIAL/PLATELET
Abs Immature Granulocytes: 0.11 10*3/uL — ABNORMAL HIGH (ref 0.00–0.07)
Basophils Absolute: 0 10*3/uL (ref 0.0–0.1)
Basophils Relative: 0 %
Eosinophils Absolute: 0.1 10*3/uL (ref 0.0–0.5)
Eosinophils Relative: 1 %
HCT: 27.8 % — ABNORMAL LOW (ref 36.0–46.0)
Hemoglobin: 9 g/dL — ABNORMAL LOW (ref 12.0–15.0)
Immature Granulocytes: 1 %
Lymphocytes Relative: 6 %
Lymphs Abs: 0.9 10*3/uL (ref 0.7–4.0)
MCH: 32 pg (ref 26.0–34.0)
MCHC: 32.4 g/dL (ref 30.0–36.0)
MCV: 98.9 fL (ref 80.0–100.0)
Monocytes Absolute: 1.6 10*3/uL — ABNORMAL HIGH (ref 0.1–1.0)
Monocytes Relative: 11 %
Neutro Abs: 12.1 10*3/uL — ABNORMAL HIGH (ref 1.7–7.7)
Neutrophils Relative %: 81 %
Platelets: 204 10*3/uL (ref 150–400)
RBC: 2.81 MIL/uL — ABNORMAL LOW (ref 3.87–5.11)
RDW: 15 % (ref 11.5–15.5)
WBC: 14.8 10*3/uL — ABNORMAL HIGH (ref 4.0–10.5)
nRBC: 0.4 % — ABNORMAL HIGH (ref 0.0–0.2)

## 2023-03-07 LAB — GLUCOSE, CAPILLARY
Glucose-Capillary: 134 mg/dL — ABNORMAL HIGH (ref 70–99)
Glucose-Capillary: 95 mg/dL (ref 70–99)
Glucose-Capillary: 154 mg/dL — ABNORMAL HIGH (ref 70–99)
Glucose-Capillary: 112 mg/dL — ABNORMAL HIGH (ref 70–99)

## 2023-03-07 MED ORDER — OXYCODONE HCL 5 MG PO TABS
5.0000 mg | ORAL_TABLET | Freq: Every day | ORAL | Status: DC
  Administered 2023-03-07: 5 mg via ORAL
  Filled 2023-03-07: qty 1

## 2023-03-07 NOTE — Progress Notes (Signed)
 PROGRESS NOTE  Copeland Lapier  DOB: 11/30/1940  PCP: [facility] FMW:[id number]  DOA: 03/02/2023  LOS: 5 days  Hospital Day: 6  Brief narrative: Rhya Shan is a 83 y.o. female with PMH significant for ESRD HD MWF, dementia with behavioral disturbance, DM2, HTN, HLD, CHF, A-fib on Eliquis , CKD, COPD, obesity, sleep apnea 1/8, patient was brought to the ED from home after a fall..  Patient was apparently trying to roll over in bed when she slipped out falling to the floor on her buttocks.  She was on the floor for 20 minutes until her daughter arrived.  Complained of right hip pain.  EMS was called and patient was brought to the ED. At baseline, she uses a wheelchair for mobility.  X-ray showed closed displaced fracture of right femoral neck Admitted to [facility] Orthopedics Dr. Sharl was consulted for surgical fixation 1/9, underwent right hip hemiarthroplasty  Subjective: Patient was seen and examined this morning in HD.  Not in distress.  Oriented x 3 today.  Complains of back pain probably because of being in the same position. I met with patient's 2 daughters in her room. Reports that patient was up every hour last night due to the pain.  They have agreed to try some oxycodone  at bedtime.  They state patient has bad experience in the past with tramadol  and want to avoid oversedation.  Assessment and plan: Closed displaced fracture of right femoral neck S/p right hip hemiarthroplasty -1/9 Dr. Celena Secondary to a fall from bed Imaging and procedure as above Pain control with scheduled Tylenol  only.  Family states that patient has bad experience in the past with tramadol  and want to avoid oversedation.  And she has not been receiving any as needed opiate.  Patient was up every hour last night with pain.  Daughters have agreed to try some oxycodone  at bedtime  Bowel regimen -currently on Senokot daily, MiraLAX  as needed.  No bowel movement in several  days.  Ordered for MiraLAX  twice daily and 1 dose of enema yesterday.  No documents from BM.  May need to repeat enema again today. DVT prophylaxis -hemoglobin stable.  Eliquis  resumed. PT/OT eval obtained.  SNF recommended.  Family is also thinking about discharging home if arrangements can be made in 1 to 2 days  Acute blood loss anemia Baseline hemoglobin above 12.  Post op, hemoglobin is downtrending secondary to blood loss from fracture and surgery Hemoglobin stable between 9 and 10. No bleeding elsewhere. Continue to monitor. Eliquis  resumed Repeat H&H and type and screen tomorrow. Recent Labs    04/15/22 1435 05/19/22 2150 03/03/23 0740 03/04/23 0611 03/05/23 0424 03/06/23 0450 03/07/23 0439  HGB  --    < > 12.9 9.7* 8.6* 9.7* 9.0*  MCV  --    < >  --  99.0 98.5 99.4 98.9  VITAMINB12 818  --   --   --   --   --   --    < > = values in this interval not displayed.   ESRD HD MWF Nephro following. 1/10 underwent fistulogram.  No stenotic segment found.  Chronic combined systolic and diastolic CHF  Hypotension Most recent echo from March 2024 with EF 55 to 60% and grade 2 diastolic dysfunction  Because of chronic hypertension, patient is on midodrine  before dialysis on MWF. Blood pressure is better after midodrine  frequency was increased to daily  Continue to monitor blood pressure  Type 2 diabetes mellitus A1c  7.9 on 05/19/2022 PTA meds- Lantus  20 units in a.m. Currently on Lantus  20 units since this morning.  Blood sugar improving.  SSI/Accu-Cheks.  Recent Labs  Lab 03/06/23 1123 03/06/23 1724 03/06/23 2116 03/07/23 0608 03/07/23 1401  GLUCAP 108* 242* 174* 134* 95   Acute metabolic encephalopathy Dementia without behavioral disturbance  Resume likely due to hospitalization, pain meds in the setting of vasculopathy, ESRD HD.  She has similar history from past. Continue reorientation effort.  Continue melatonin nightly.   Paroxysmal atrial fibrillation Cont  amiodarone  Eliquis  on hold for surgery  GERD PPI   Mobility: Needs PT eval postprocedure  Goals of care   Code Status: Full Code    DVT prophylaxis:  apixaban  (ELIQUIS ) tablet 2.5 mg Start: 03/06/23 1000 SCDs Start: 03/03/23 1402 Place and maintain sequential compression device Start: 03/02/23 1443 apixaban  (ELIQUIS ) tablet 2.5 mg   Antimicrobials: None Fluid: None Consultants: Orthopedics Family Communication: Daughters at bedside today  Status: Inpatient Level of care:  Med-Surg   Patient is from: Home Needs to continue in-hospital care: Pending SNF Anticipated d/c to: Pending SNF    Diet:  Diet Order             Diet Carb Modified Fluid consistency: Thin; Room service appropriate? Yes  Diet effective now                   Scheduled Meds:  (feeding supplement) PROSource Plus  30 mL Oral BID BM   acetaminophen   1,000 mg Oral TID   amiodarone   200 mg Oral Daily   apixaban   2.5 mg Oral BID   atorvastatin   80 mg Oral QHS   Chlorhexidine  Gluconate Cloth  6 each Topical Q0600   darbepoetin (ARANESP ) injection - DIALYSIS  60 mcg Subcutaneous Q Fri-1800   insulin  aspart  0-5 Units Subcutaneous QHS   insulin  aspart  0-9 Units Subcutaneous TID WC   insulin  glargine-yfgn  20 Units Subcutaneous Daily   melatonin  3 mg Oral QHS   midodrine   10 mg Oral Daily   multivitamin  1 tablet Oral QHS   oxyCODONE   5 mg Oral QHS   pantoprazole   40 mg Oral BID   polyethylene glycol  17 g Oral BID   senna-docusate  1 tablet Oral QHS    PRN meds: diphenhydrAMINE , menthol -cetylpyridinium **OR** phenol, metoCLOPramide  **OR** metoCLOPramide  (REGLAN ) injection   Infusions:     Antimicrobials: Anti-infectives (From admission, onward)    Start     Dose/Rate Route Frequency Ordered Stop   03/03/23 1500  ceFAZolin  (ANCEF ) IVPB 1 g/50 mL premix        1 g 100 mL/hr over 30 Minutes Intravenous  Once 03/03/23 1401 03/03/23 1502   03/03/23 0730  ceFAZolin  (ANCEF ) IVPB 2g/100  mL premix        2 g 200 mL/hr over 30 Minutes Intravenous On call to O.R. 03/03/23 0704 03/03/23 0846   03/03/23 0712  ceFAZolin  (ANCEF ) 2-4 GM/100ML-% IVPB       Note to Pharmacy: Marvin Domino E: cabinet override      03/03/23 0712 03/03/23 0914       Objective: Vitals:   03/07/23 1321 03/07/23 1330  BP: (!) 142/50   Pulse: 71   Resp: 18   Temp: 98 F (36.7 C) 98 F (36.7 C)  SpO2: 98%     Intake/Output Summary (Last 24 hours) at 03/07/2023 1406 Last data filed at 03/07/2023 1321 Gross per 24 hour  Intake --  Output  2000 ml  Net -2000 ml    Filed Weights   03/03/23 0656 03/07/23 0927 03/07/23 1321  Weight: 76 kg 77.8 kg 75.8 kg   Weight change:  Body mass index is 30.56 kg/m.   Physical Exam: General exam: Pleasant, elderly.  Not in distress. Skin: No rashes, lesions or ulcers. HEENT: Atraumatic, normocephalic, no obvious bleeding Lungs: Clear to auscultation bilaterally,  CVS: S1, S2, no murmur,   GI/Abd: Soft, nontender, nondistended, bowel sound present,   CNS: Alert, awake, oriented x 3.   Psychiatry: Mood appropriate,  Extremities: No pedal edema, no calf tenderness  Data Review: I have personally reviewed the laboratory data and studies available.  F/u labs collected Unresulted Labs (From admission, onward)     Start     Ordered   Signed and Held  Renal function panel  Once,   R       Question:  Specimen collection method  Answer:  Lab=Lab collect   Signed and Held   Signed and Held  CBC  Once,   R       Question:  Specimen collection method  Answer:  Lab=Lab collect   Signed and Held            Total time spent in review of labs and imaging, patient evaluation, formulation of plan, documentation and communication with family: 45 minutes  Signed, Chapman Rota, MD [facility] 03/07/2023

## 2023-03-07 NOTE — Progress Notes (Signed)
 PT Cancellation Note  Patient Details Name: Stephanie Woodward MRN: [id number] DOB: Mar 07, 1940   Cancelled Treatment:    Reason Eval/Treat Not Completed: (P) Patient at procedure or test/unavailable (pt at HD dept.) Attempt 9:45am and 12:33pm, pt off unit. Will continue efforts per PT plan of care as schedule permits.   Connell HERO Cornella Emmer 03/07/2023, 12:33 PM

## 2023-03-07 NOTE — Progress Notes (Signed)
 Received patient in bed.Awake,alert and oriented x 3. She signed her consent treatment.  Access used> Left upper arm AVF that worked well.  Duration of treatment:3.5 hours.  Net uf : Met 2000.  Tolerated treatment: Yes.  Medicine given: Tylenol  nmg.  Hand off to the patient's nurse : Back into her room with stable medical condition via transporter.

## 2023-03-07 NOTE — Procedures (Signed)
 I was present at this dialysis session. I have reviewed the session itself and made appropriate changes.   K of 5.1 on 2 K bath.  UF goal of 2.  Using AVF for vascular access.  Plan for SNF at DC.  Cont HD on MWF schedule.   Hb stable in 9s on ESA.    Filed Weights   03/02/23 0139 03/03/23 0656 03/07/23 0927  Weight: 76 kg 76 kg 77.8 kg    Recent Labs  Lab 03/02/23 0234 03/03/23 0740 03/07/23 0439  NA 138   < > 133*  K 4.0   < > 5.1  CL 96*   < > 95*  CO2 26   < > 24  GLUCOSE 71   < > 164*  BUN 29*   < > 70*  CREATININE 6.94*   < > 8.19*  CALCIUM  8.9   < > 9.0  PHOS 4.9*  --   --    < > = values in this interval not displayed.    Recent Labs  Lab 03/02/23 0234 03/03/23 0740 03/05/23 0424 03/06/23 0450 03/07/23 0439  WBC 12.2*   < > 13.4* 13.3* 14.8*  NEUTROABS 10.1*  --   --   --  12.1*  HGB 12.3   < > 8.6* 9.7* 9.0*  HCT 38.5   < > 26.7* 30.8* 27.8*  MCV 100.0   < > 98.5 99.4 98.9  PLT 197   < > 161 189 204   < > = values in this interval not displayed.    Scheduled Meds:  (feeding supplement) PROSource Plus  30 mL Oral BID BM   acetaminophen   1,000 mg Oral TID   amiodarone   200 mg Oral Daily   apixaban   2.5 mg Oral BID   atorvastatin   80 mg Oral QHS   Chlorhexidine  Gluconate Cloth  6 each Topical Q0600   darbepoetin (ARANESP ) injection - DIALYSIS  60 mcg Subcutaneous Q Fri-1800   insulin  aspart  0-5 Units Subcutaneous QHS   insulin  aspart  0-9 Units Subcutaneous TID WC   insulin  glargine-yfgn  20 Units Subcutaneous Daily   melatonin  3 mg Oral QHS   midodrine   10 mg Oral Daily   multivitamin  1 tablet Oral QHS   pantoprazole   40 mg Oral BID   polyethylene glycol  17 g Oral BID   senna-docusate  1 tablet Oral QHS   Continuous Infusions: PRN Meds:.diphenhydrAMINE , menthol -cetylpyridinium **OR** phenol, metoCLOPramide  **OR** metoCLOPramide  (REGLAN ) injection   Bernardino Gasman  MD 03/07/2023, 9:47 AM

## 2023-03-07 NOTE — Care Management Important Message (Signed)
 Important Message  Patient Details  Name: Stephanie Woodward MRN: [id number] Date of Birth: 03/01/1940   Important Message Given:  Yes - Medicare IM     Dorena Bodo 03/07/2023, 3:40 PM

## 2023-03-07 NOTE — Plan of Care (Signed)
  Problem: Education: Goal: Ability to describe self-care measures that may prevent or decrease complications (Diabetes Survival Skills Education) will improve Outcome: Progressing Goal: Individualized Educational Video(s) Outcome: Progressing   Problem: Coping: Goal: Ability to adjust to condition or change in health will improve Outcome: Progressing   Problem: Fluid Volume: Goal: Ability to maintain a balanced intake and output will improve Outcome: Progressing   Problem: Health Behavior/Discharge Planning: Goal: Ability to identify and utilize available resources and services will improve Outcome: Progressing Goal: Ability to manage health-related needs will improve Outcome: Progressing   Problem: Metabolic: Goal: Ability to maintain appropriate glucose levels will improve Outcome: Progressing   Problem: Nutritional: Goal: Maintenance of adequate nutrition will improve Outcome: Progressing Goal: Progress toward achieving an optimal weight will improve Outcome: Progressing   Problem: Skin Integrity: Goal: Risk for impaired skin integrity will decrease Outcome: Progressing   Problem: Tissue Perfusion: Goal: Adequacy of tissue perfusion will improve Outcome: Progressing   Problem: Education: Goal: Knowledge of General Education information will improve Description: Including pain rating scale, medication(s)/side effects and non-pharmacologic comfort measures Outcome: Progressing   Problem: Health Behavior/Discharge Planning: Goal: Ability to manage health-related needs will improve Outcome: Progressing   Problem: Clinical Measurements: Goal: Ability to maintain clinical measurements within normal limits will improve Outcome: Progressing Goal: Will remain free from infection Outcome: Progressing Goal: Diagnostic test results will improve Outcome: Progressing Goal: Respiratory complications will improve Outcome: Progressing Goal: Cardiovascular complication will  be avoided Outcome: Progressing   Problem: Activity: Goal: Risk for activity intolerance will decrease Outcome: Progressing   Problem: Nutrition: Goal: Adequate nutrition will be maintained Outcome: Progressing   Problem: Coping: Goal: Level of anxiety will decrease Outcome: Progressing   Problem: Elimination: Goal: Will not experience complications related to bowel motility Outcome: Progressing Goal: Will not experience complications related to urinary retention Outcome: Progressing   Problem: Pain Management: Goal: General experience of comfort will improve Outcome: Progressing   Problem: Safety: Goal: Ability to remain free from injury will improve Outcome: Progressing   Problem: Skin Integrity: Goal: Risk for impaired skin integrity will decrease Outcome: Progressing   Problem: Education: Goal: Verbalization of understanding the information provided (i.e., activity precautions, restrictions, etc) will improve Outcome: Progressing Goal: Individualized Educational Video(s) Outcome: Progressing   Problem: Activity: Goal: Ability to ambulate and perform ADLs will improve Outcome: Progressing   Problem: Clinical Measurements: Goal: Postoperative complications will be avoided or minimized Outcome: Progressing   Problem: Self-Concept: Goal: Ability to maintain and perform role responsibilities to the fullest extent possible will improve Outcome: Progressing   Problem: Pain Management: Goal: Pain level will decrease Outcome: Progressing

## 2023-03-07 NOTE — TOC Progression Note (Addendum)
 Transition of Care [facility]) - Progression Note    Patient Details  Name: Stephanie Woodward MRN: [id number] Date of Birth: 03/23/1940  Transition of [facility] CM/SW Contact  Bridget Cordella Simmonds, LCSW Phone Number: 03/07/2023, 8:39 AM  Clinical Narrative:   CSW spoke with Traci/PACE regarding SNF recommendation.  [facility] is in agreement with this plan.  Pt family is asking for meeting with [facility] and medical team this AM and Wilbert is going to speak to the daughter shortly about when this will take place.    1315: CSW spoke with pt and daughters Reena and Lafayette regarding bed offers.  They would like to hear from all pace options before deciding.    Expected Discharge Plan: Skilled Nursing Facility Barriers to Discharge: Continued Medical Work up  Expected Discharge Plan and Services In-house Referral: Clinical Social Work   Post Acute Care Choice: Skilled Nursing Facility Living arrangements for the past 2 months: Apartment                                       Social Determinants of Health (SDOH) Interventions SDOH Screenings   Food Insecurity: No Food Insecurity (03/02/2023)  Housing: Low Risk  (03/02/2023)  Transportation Needs: No Transportation Needs (03/02/2023)  Utilities: Not At Risk (03/02/2023)  Depression (PHQ2-9): Low Risk  (12/03/2019)  Financial Resource Strain: Low Risk  (02/27/2021)  Social Connections: Moderately Isolated (03/02/2023)  Tobacco Use: Medium Risk (03/03/2023)    Readmission Risk Interventions     No data to display

## 2023-03-07 NOTE — Progress Notes (Signed)
 PT Cancellation Note  Patient Details Name: Stephanie Woodward MRN: [id number] DOB: 1940-08-18   Cancelled Treatment:    Reason Eval/Treat Not Completed: Fatigue/lethargy limiting ability to participate. Pt is asleep upon PT arrival, pt's daughter reports the pt is likely tired after dialysis session. PT provides education on active assisted range of motion exercise which family can assist the pt in later this evening to reduce potential stiffness at hip and knee. PT will follow up tomorrow for further mobilization after the patient has had time to rest.   Bernardino JINNY Ruth 03/07/2023, 4:47 PM

## 2023-03-08 DIAGNOSIS — S72001A Fracture of unspecified part of neck of right femur, initial encounter for closed fracture: Secondary | ICD-10-CM | POA: Diagnosis not present

## 2023-03-08 LAB — GLUCOSE, CAPILLARY
Glucose-Capillary: 121 mg/dL — ABNORMAL HIGH (ref 70–99)
Glucose-Capillary: 105 mg/dL — ABNORMAL HIGH (ref 70–99)
Glucose-Capillary: 124 mg/dL — ABNORMAL HIGH (ref 70–99)
Glucose-Capillary: 267 mg/dL — ABNORMAL HIGH (ref 70–99)
Glucose-Capillary: 203 mg/dL — ABNORMAL HIGH (ref 70–99)

## 2023-03-08 MED ORDER — OXYCODONE HCL 5 MG PO TABS
5.0000 mg | ORAL_TABLET | Freq: Every evening | ORAL | Status: DC | PRN
  Administered 2023-03-08: 5 mg via ORAL
  Filled 2023-03-08 (×2): qty 1

## 2023-03-08 MED ORDER — MEDIHONEY WOUND/BURN DRESSING EX PSTE
1.0000 | PASTE | Freq: Every day | CUTANEOUS | Status: DC
  Administered 2023-03-08 – 2023-03-11 (×4): 1 via TOPICAL
  Filled 2023-03-08 (×2): qty 44

## 2023-03-08 MED ORDER — ZINC OXIDE 40 % EX OINT
TOPICAL_OINTMENT | Freq: Two times a day (BID) | CUTANEOUS | Status: DC
  Administered 2023-03-11: 1 via TOPICAL
  Filled 2023-03-08 (×2): qty 57

## 2023-03-08 MED ORDER — POLYETHYLENE GLYCOL 3350 17 G PO PACK
17.0000 g | PACK | Freq: Every day | ORAL | Status: DC | PRN
  Administered 2023-03-11: 17 g via ORAL

## 2023-03-08 MED ORDER — LIDOCAINE 5 % EX PTCH
1.0000 | MEDICATED_PATCH | CUTANEOUS | Status: AC
  Administered 2023-03-08: 1 via TRANSDERMAL
  Filled 2023-03-08: qty 1

## 2023-03-08 NOTE — Progress Notes (Signed)
 Physical Therapy Treatment Patient Details Name: Stephanie Woodward MRN: [id number] DOB: 31-Aug-1940 Today's Date: 03/08/2023   History of Present Illness 83 y.o. female presents to [facility] 03/02/23 after falling out of bed  w/ R femoral neck fx. S/p hemiarthroplasty of R hip 1/9. PMHx: arthritis, a-fib, CHF, CKD, COPD, DM, gout, HTN    PT Comments  Pt able to transfer OOB today for the first time since sx. She required +2 total assist bed mobility, +2 total assist sit to stand, and dependent bed to recliner transfer with stedy. Pt with primary c/o pain at R hip and buttocks wounds. Increased pain with placement of stedy seat. Will assess next session if lateral scoot transfer is better option. Instructed/reviewed simple ROM exercises with daughter that pt can perform in recliner. Reviewed post hip prec. Pt in recliner at end of session. Maximove sling in place for return to bed.     If plan is discharge home, recommend the following: A lot of help with bathing/dressing/bathroom;Assistance with cooking/housework;Assistance with feeding;Direct supervision/assist for medications management;Direct supervision/assist for financial management;Help with stairs or ramp for entrance;Assist for transportation;Two people to help with walking and/or transfers   Can travel by private vehicle     No  Equipment Recommendations  Other (comment) (TBD at next venue)    Recommendations for Other Services       Precautions / Restrictions Precautions Precautions: Posterior Hip;Fall Precaution Comments: reviewed posterior hip precautions. Restrictions Weight Bearing Restrictions Per Provider Order: Yes RLE Weight Bearing Per Provider Order: Weight bearing as tolerated     Mobility  Bed Mobility Overal bed mobility: Needs Assistance Bed Mobility: Supine to Sit     Supine to sit: Total assist, +2 for physical assistance     General bed mobility comments: helicopter method using bed pad     Transfers Overall transfer level: Needs assistance   Transfers: Sit to/from Stand, Bed to chair/wheelchair/BSC Sit to Stand: +2 physical assistance, Total assist           General transfer comment: assist at gait belt and bed pad under hips to power up to stand. Pt maintaining flexed posture. Difficulty getting stedy seat under pt. Painful due to sores on buttocks. Transfer via Lift Equipment: Stedy  Ambulation/Gait                   Stairs             Wheelchair Mobility     Tilt Bed    Modified Rankin (Stroke Patients Only)       Balance Overall balance assessment: Needs assistance Sitting-balance support: Bilateral upper extremity supported, Feet supported Sitting balance-Leahy Scale: Poor   Postural control: Posterior lean                                  Cognition Arousal: Alert Behavior During Therapy: WFL for tasks assessed/performed Overall Cognitive Status: History of cognitive impairments - at baseline                                 General Comments: increased processing time        Exercises General Exercises - Lower Extremity Ankle Circles/Pumps: AROM, Both, 10 reps Other Exercises Other Exercises: Instructed daughter on quad sets, glut sets, and hip abd/add in recliner    General Comments General comments (skin integrity, edema, etc.): Soft BP  this AM. No c/o dizziness. Pt on 1L continuous O2.      Pertinent Vitals/Pain Pain Assessment Pain Assessment: Faces Faces Pain Scale: Hurts even more Pain Location: R hip, buttocks (bed sores) Pain Descriptors / Indicators: Discomfort, Grimacing, Guarding Pain Intervention(s): Monitored during session, Repositioned    Home Living                          Prior Function            PT Goals (current goals can now be found in the care plan section) Acute Rehab PT Goals Patient Stated Goal: home Progress towards PT goals: Progressing  toward goals    Frequency    Min 1X/week      PT Plan      Co-evaluation              AM-PAC PT 6 Clicks Mobility   Outcome Measure  Help needed turning from your back to your side while in a flat bed without using bedrails?: Total Help needed moving from lying on your back to sitting on the side of a flat bed without using bedrails?: Total Help needed moving to and from a bed to a chair (including a wheelchair)?: Total Help needed standing up from a chair using your arms (e.g., wheelchair or bedside chair)?: Total Help needed to walk in hospital room?: Total Help needed climbing 3-5 steps with a railing? : Total 6 Click Score: 6    End of Session Equipment Utilized During Treatment: Oxygen;Gait belt Activity Tolerance: Patient tolerated treatment well Patient left: in chair;with call bell/phone within reach;with family/visitor present;with chair alarm set Nurse Communication: Mobility status;Need for lift equipment PT Visit Diagnosis: Unsteadiness on feet (R26.81);History of falling (Z91.81);Muscle weakness (generalized) (M62.81);Other abnormalities of gait and mobility (R26.89)     Time: 8985-8957 PT Time Calculation (min) (ACUTE ONLY): 28 min  Charges:    $Therapeutic Activity: 23-37 mins PT General Charges $$ ACUTE PT VISIT: 1 Visit                     Sari MATSU., PT  Office # [phone]    Erven Sari Shaker 03/08/2023, 11:34 AM

## 2023-03-08 NOTE — Progress Notes (Signed)
 [facility] Progress Note   Subjective:   Completed dialysis yesterday. Net UF 2L. AVF successfully.  Patient seen and examined at bedside. Daughters at bedside. Soft BP this am. Did get midodrine . Uncomfortable in bed. Ongoing GI concerns. Not eating much.   Objective Vitals:   03/07/23 1330 03/08/23 0453 03/08/23 0547 03/08/23 0852  BP:  (!) 85/38 (!) 85/34 93/73  Pulse:  99  96  Resp:  18  18  Temp: 98 F (36.7 C) 98 F (36.7 C)  98 F (36.7 C)  TempSrc: Oral Oral  Oral  SpO2:  100%  99%  Weight:      Height:       Physical Exam General:chronically ill appearing, elderly female in NAD Heart:RRR Lungs:CTAB, nml WOB on 2L O2 Abdomen:soft, NTND Extremities:trace LE edema+b/t Dialysis Access: LU AVF +b/t   Filed Weights   03/03/23 0656 03/07/23 0927 03/07/23 1321  Weight: 76 kg 77.8 kg 75.8 kg    Intake/Output Summary (Last 24 hours) at 03/08/2023 1101 Last data filed at 03/07/2023 1321 Gross per 24 hour  Intake --  Output 2000 ml  Net -2000 ml    Additional Objective Labs: Basic Metabolic Panel: Recent Labs  Lab 03/02/23 0234 03/03/23 0740 03/04/23 0611 03/05/23 0424 03/07/23 0439  NA 138   < > 135 136 133*  K 4.0   < > 4.4 4.2 5.1  CL 96*   < > 96* 97* 95*  CO2 26  --  26 27 24   GLUCOSE 71   < > 211* 185* 164*  BUN 29*   < > 38* 30* 70*  CREATININE 6.94*   < > 6.75* 4.59* 8.19*  CALCIUM  8.9  --  9.1 8.6* 9.0  PHOS 4.9*  --   --   --   --    < > = values in this interval not displayed.   Liver Function Tests: Recent Labs  Lab 03/02/23 0234  ALBUMIN  3.2*   CBC: Recent Labs  Lab 03/02/23 0234 03/03/23 0740 03/04/23 0611 03/05/23 0424 03/06/23 0450 03/07/23 0439  WBC 12.2*  --  14.2* 13.4* 13.3* 14.8*  NEUTROABS 10.1*  --   --   --   --  12.1*  HGB 12.3   < > 9.7* 8.6* 9.7* 9.0*  HCT 38.5   < > 30.3* 26.7* 30.8* 27.8*  MCV 100.0  --  99.0 98.5 99.4 98.9  PLT 197  --  174 161 189 204   < > = values in this interval not  displayed.   Blood Culture    Component Value Date/Time   SDES BLOOD LEFT HAND 05/02/2021 0201   SPECREQUEST  05/02/2021 0201    BOTTLES DRAWN AEROBIC AND ANAEROBIC Blood Culture results may not be optimal due to an inadequate volume of blood received in culture bottles   CULT  05/02/2021 0201    NO GROWTH 5 DAYS Performed at [facility], [address] [place], [place] [address]    REPTSTATUS 05/07/2021 FINAL 05/02/2021 0201     CBG: Recent Labs  Lab 03/07/23 1401 03/07/23 1617 03/07/23 2048 03/08/23 0623 03/08/23 0850  GLUCAP 95 154* 112* 121* 105*    Medications:   (feeding supplement) PROSource Plus  30 mL Oral BID BM   acetaminophen   1,000 mg Oral TID   amiodarone   200 mg Oral Daily   apixaban   2.5 mg Oral BID   atorvastatin   80 mg Oral QHS   Chlorhexidine  Gluconate Cloth  6 each Topical Q0600   darbepoetin (ARANESP ) injection - DIALYSIS  60 mcg Subcutaneous Q Fri-1800   insulin  aspart  0-5 Units Subcutaneous QHS   insulin  aspart  0-9 Units Subcutaneous TID WC   insulin  glargine-yfgn  20 Units Subcutaneous Daily   melatonin  3 mg Oral QHS   midodrine   10 mg Oral Daily   multivitamin  1 tablet Oral QHS   oxyCODONE   5 mg Oral QHS   pantoprazole   40 mg Oral BID   polyethylene glycol  17 g Oral BID   senna-docusate  1 tablet Oral QHS    Dialysis Orders: [facility] M[facility]  4h   350/600   77.3kg   2/2 bath   AVF LUA  Heparin  none - hectorol  9 mcg  - last Hb 12.0   Assessment/Plan: Right hip fracture: fall at home, unipolar hemiarthroplasty of right hip on 1/9 ESRD: MWF HD.  Next HD Wed.  HTN/volume:  Hypotensive. Gets midodrine  pre-hd. Under dry weight,  possible weight loss. Does not appear grossly. Even UF next HD.  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  Secondary HPTH: Last Ca and phos good. Not on binder. Hectorol  9 mcg Nutrition: Albumin  3.2. Carb modified diet. Continue supplements.  Dementia: chronic, stable, baseline DM: continue  with lantus  PAF: continue with amio. Eliquis  on hold Combined diastolic/systolic CHF: EF at 55-60%, still G3DD Prolonged bleeding of fistula: fistulagram 03/03/22. No evidence of stenotic segments, inflow and centrals widely patient, cephalic vein fistula is orthogonal to rest of cephalic vein, need to map out cephalic vein to prevent cannulating close to orthogonal point. AVF used successfully on 1/13.  Dispo: For SNF placement   [place] Lapaglia Ronnald Acosta PA-C [facility] Pager [phone] 03/08/2023,11:01 AM

## 2023-03-08 NOTE — Progress Notes (Signed)
 PROGRESS NOTE  Stephanie Woodward  DOB: [birth year]  PCP: [facility] FMW:[id number]  DOA: 03/02/2023  LOS: 6 days  Hospital Day: 7  Brief narrative: Stephanie Woodward is a 83 y.o. female with PMH significant for ESRD HD MWF, dementia with behavioral disturbance, DM2, HTN, HLD, CHF, A-fib on Eliquis , CKD, COPD, obesity, sleep apnea 1/8, patient was brought to the ED from home after a fall..  Patient was apparently trying to roll over in bed when she slipped out falling to the floor on her buttocks.  She was on the floor for 20 minutes until her daughter arrived.  Complained of right hip pain.  EMS was called and patient was brought to the ED. At baseline, she uses a wheelchair for mobility.  X-ray showed closed displaced fracture of right femoral neck Admitted to [facility] Dr. Sharl was consulted for surgical fixation 1/9, underwent right hip hemiarthroplasty Currently pending placement.  Family also think a possible discharge to home if her physical strength improves.  Subjective: Patient was seen and examined this morning. Propped up in bed.  Daughters at bedside. Patient is alert, awake, oriented x 2 this morning.  Per daughters, she was somewhat confused last night and had pain at the tailbone area.  They have noticed that patient has gotten decubitus ulcer which they say she did not have prior to presentation. Blood pressure down in 80s this morning.  Assessment and plan: Closed displaced fracture of right femoral neck S/p right hip hemiarthroplasty -1/9 Dr. Celena Secondary to a fall from bed Imaging and procedure as above Currently on pain control with scheduled Tylenol  and opioid 5 mg nightly. Family is very apprehensive about opioids because patient had a bad experience in the past with tramadol  causing oversedation.  For now, patient and daughters are so far okay with nighttime oxycodone  to at least give her good pain relief at night  Bowel  regimen -currently on Senokot daily, MiraLAX  as needed.   DVT prophylaxis -hemoglobin stable.  Eliquis  resumed. PT/OT eval obtained.  SNF recommended.  Family is also thinking about discharging home if arrangements can be made in 1 to 2 days  Acute blood loss anemia Baseline hemoglobin above 12.  Post op, hemoglobin is downtrending secondary to blood loss from fracture and surgery Hemoglobin stable between 9 and 10. No bleeding elsewhere. Continue to monitor. Eliquis  resumed Repeat H&H and type and screen tomorrow. Recent Labs    04/15/22 1435 05/19/22 2150 03/03/23 0740 03/04/23 0611 03/05/23 0424 03/06/23 0450 03/07/23 0439  HGB  --    < > 12.9 9.7* 8.6* 9.7* 9.0*  MCV  --    < >  --  99.0 98.5 99.4 98.9  VITAMINB12 818  --   --   --   --   --   --    < > = values in this interval not displayed.   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 1/10 underwent fistulogram.  No stenotic segment found. Most recent echo from March 2024 with EF 55 to 60% and grade 2 diastolic dysfunction  For chronic hypertension, patient was on midodrine  before dialysis on MWF. Currently on midodrine  daily.  May need further escalation if blood pressure does not improve.  Leukocytosis WBC count was uptrending.  No fever. Probably due to atelectasis.  Also requiring low-flow oxygen. Encourage incentive spirometry Repeat labs tomorrow Recent Labs  Lab 03/02/23 0234 03/04/23 9388 03/05/23 0424 03/06/23 0450 03/07/23  0439  WBC 12.2* 14.2* 13.4* 13.3* 14.8*   Type 2 diabetes mellitus A1c 7.9 on 05/19/2022 Currently blood pressure stable on on Lantus  20 units and SSI/Accu-Cheks Recent Labs  Lab 03/07/23 1617 03/07/23 2048 03/08/23 0623 03/08/23 0850 03/08/23 1106  GLUCAP 154* 112* 121* 105* 124*   Acute metabolic encephalopathy Dementia without behavioral disturbance  Intermittent delirium in the setting of hospitalization, pain meds,  vasculopathy, ESRD HD.   Minimize use of mood altering medication.  Continue reorientation effort.  Continue melatonin nightly.   Paroxysmal atrial fibrillation Cont amiodarone  Eliquis  resumed  GERD PPI  Impaired mobility Wheelchair-bound at baseline but was able to transfer out of bed to chair.  Currently weak PT eval obtained.  SNF recommended.  Family is hoping to take her home if she is able to make gain some upper body strength  ??  Pressure ulcer Daughters state that patient had pressure ulcer in the past when she was at the rehab but that had gotten better recently she did not have one.  But they have noticed that this hospitalization, patient has developed pressure ulcer again. Wound care consulted.   Goals of care   Code Status: Full Code    DVT prophylaxis:  apixaban  (ELIQUIS ) tablet 2.5 mg Start: 03/06/23 1000 SCDs Start: 03/03/23 1402 Place and maintain sequential compression device Start: 03/02/23 1443 apixaban  (ELIQUIS ) tablet 2.5 mg   Antimicrobials: None Fluid: None Consultants: Orthopedics Family Communication: Daughters at bedside  Status: Inpatient Level of care:  Med-Surg   Patient is from: Home Needs to continue in-hospital care: Pending SNF Anticipated d/c to: Pending SNF    Diet:  Diet Order             Diet Carb Modified Fluid consistency: Thin; Room service appropriate? Yes  Diet effective now                   Scheduled Meds:  (feeding supplement) PROSource Plus  30 mL Oral BID BM   acetaminophen   1,000 mg Oral TID   amiodarone   200 mg Oral Daily   apixaban   2.5 mg Oral BID   atorvastatin   80 mg Oral QHS   Chlorhexidine  Gluconate Cloth  6 each Topical Q0600   darbepoetin (ARANESP ) injection - DIALYSIS  60 mcg Subcutaneous Q Fri-1800   insulin  aspart  0-5 Units Subcutaneous QHS   insulin  aspart  0-9 Units Subcutaneous TID WC   insulin  glargine-yfgn  20 Units Subcutaneous Daily   melatonin  3 mg Oral QHS   midodrine   10 mg  Oral Daily   multivitamin  1 tablet Oral QHS   oxyCODONE   5 mg Oral QHS   pantoprazole   40 mg Oral BID   senna-docusate  1 tablet Oral QHS    PRN meds: diphenhydrAMINE , menthol -cetylpyridinium **OR** phenol, metoCLOPramide  **OR** metoCLOPramide  (REGLAN ) injection, polyethylene glycol   Infusions:     Antimicrobials: Anti-infectives (From admission, onward)    Start     Dose/Rate Route Frequency Ordered Stop   03/03/23 1500  ceFAZolin  (ANCEF ) IVPB 1 g/50 mL premix        1 g 100 mL/hr over 30 Minutes Intravenous  Once 03/03/23 1401 03/03/23 1502   03/03/23 0730  ceFAZolin  (ANCEF ) IVPB 2g/100 mL premix        2 g 200 mL/hr over 30 Minutes Intravenous On call to O.R. 03/03/23 9295 03/03/23 0846   03/03/23 9287  ceFAZolin  (ANCEF ) 2-4 GM/100ML-% IVPB       Note to Pharmacy: Block,  Sarah E: cabinet override      03/03/23 0712 03/03/23 0914       Objective: Vitals:   03/08/23 0547 03/08/23 0852  BP: (!) 85/34 93/73  Pulse:  96  Resp:  18  Temp:  98 F (36.7 C)  SpO2:  99%    Intake/Output Summary (Last 24 hours) at 03/08/2023 1156 Last data filed at 03/07/2023 1321 Gross per 24 hour  Intake --  Output 2000 ml  Net -2000 ml    Filed Weights   03/03/23 0656 03/07/23 0927 03/07/23 1321  Weight: 76 kg 77.8 kg 75.8 kg   Weight change:  Body mass index is 30.56 kg/m.   Physical Exam: General exam: Pleasant, elderly.  Not in distress. Skin: No rashes, lesions or ulcers. HEENT: Atraumatic, normocephalic, no obvious bleeding Lungs: Diminished air entry in both bases.  Otherwise clear to auscultation bilaterally CVS: S1, S2, no murmur,   GI/Abd: Soft, nontender, nondistended, bowel sound present,   CNS: Slow to respond but alert, awake, oriented x 3.   Psychiatry: Mood appropriate,  Extremities: No pedal edema, no calf tenderness  Data Review: I have personally reviewed the laboratory data and studies available.  F/u labs collected Unresulted Labs (From admission,  onward)     Start     Ordered   03/09/23 0500  Basic metabolic panel  Tomorrow morning,   R       Question:  Specimen collection method  Answer:  Lab=Lab collect   03/08/23 0818   03/09/23 0500  CBC with Differential/Platelet  Tomorrow morning,   R       Question:  Specimen collection method  Answer:  Lab=Lab collect   03/08/23 0818   Signed and Held  Renal function panel  Once,   R       Question:  Specimen collection method  Answer:  Lab=Lab collect   Signed and Held   Signed and Held  CBC  Once,   R       Question:  Specimen collection method  Answer:  Lab=Lab collect   Signed and Held            Total time spent in review of labs and imaging, patient evaluation, formulation of plan, documentation and communication with family: 45 minutes  Signed, Chapman Rota, MD [facility] 03/08/2023

## 2023-03-08 NOTE — Consult Note (Addendum)
 WOC Nurse Consult Note: Reason for Consult: wounds to buttocks  Wound type: 1.  Deep Tissue Pressure Injury L buttock evolving to Stage 2  2. Deep tissue Pressure Injury evolving to Stage 3 R buttock  Pressure Injury POA: no  Measurement: 1.  L buttock 7 cm x 3 cm total area (2 separate areas separated by a small island of intact skin) 100% red moist  2.  R buttock 5 cm x 5 cm 50% pink moist 50% tan fibrinous  Wound bed:as above  Drainage (amount, consistency, odor) minimal tan from R buttock, serosanguinous L  Periwound: peeling skin  Dressing procedure/placement/frequency: Cleanse B buttock wounds with Vashe wound cleanser Soila [phone]), apply Medihoney to wound bed of R buttock and cover with dry gauze, apply Xeroform to wound beds of L buttock. Cover both wounds with silicone foam or ABD pad whichever is preferred.   Coat surrounding skin with a thin layer of Desitin 2 times a day and prn soiling.    Patient is complaining of significant pain from this area.  Discussed use of pressure redistribution cushion that patient has in room when up to chair. Also discussed rotating side to side and trying to keep pressure off these areas. Patient may benefit from a low air loss mattress for pressure redistribution as well.    POC discussed with patient, daughter and DOROTHA Faster, RN. Appreciate her assistance with this consult.   WOC team will follow weekly for wound assessment and treatment modification as needed.   Thank you,    Powell Bar MSN, RN-BC, [employer] [phone]

## 2023-03-08 NOTE — Plan of Care (Signed)
  Problem: Education: Goal: Ability to describe self-care measures that may prevent or decrease complications (Diabetes Survival Skills Education) will improve Outcome: Progressing Goal: Individualized Educational Video(s) Outcome: Progressing   Problem: Coping: Goal: Ability to adjust to condition or change in health will improve Outcome: Progressing   Problem: Fluid Volume: Goal: Ability to maintain a balanced intake and output will improve Outcome: Progressing   Problem: Health Behavior/Discharge Planning: Goal: Ability to identify and utilize available resources and services will improve Outcome: Progressing Goal: Ability to manage health-related needs will improve Outcome: Progressing   Problem: Metabolic: Goal: Ability to maintain appropriate glucose levels will improve Outcome: Progressing   Problem: Nutritional: Goal: Maintenance of adequate nutrition will improve Outcome: Progressing Goal: Progress toward achieving an optimal weight will improve Outcome: Progressing   Problem: Skin Integrity: Goal: Risk for impaired skin integrity will decrease Outcome: Progressing   Problem: Tissue Perfusion: Goal: Adequacy of tissue perfusion will improve Outcome: Progressing   Problem: Education: Goal: Knowledge of General Education information will improve Description: Including pain rating scale, medication(s)/side effects and non-pharmacologic comfort measures Outcome: Progressing   Problem: Health Behavior/Discharge Planning: Goal: Ability to manage health-related needs will improve Outcome: Progressing   Problem: Clinical Measurements: Goal: Ability to maintain clinical measurements within normal limits will improve Outcome: Progressing Goal: Will remain free from infection Outcome: Progressing Goal: Diagnostic test results will improve Outcome: Progressing Goal: Respiratory complications will improve Outcome: Progressing Goal: Cardiovascular complication will  be avoided Outcome: Progressing   Problem: Activity: Goal: Risk for activity intolerance will decrease Outcome: Progressing   Problem: Nutrition: Goal: Adequate nutrition will be maintained Outcome: Progressing   Problem: Coping: Goal: Level of anxiety will decrease Outcome: Progressing   Problem: Elimination: Goal: Will not experience complications related to bowel motility Outcome: Progressing Goal: Will not experience complications related to urinary retention Outcome: Progressing   Problem: Pain Management: Goal: General experience of comfort will improve Outcome: Progressing   Problem: Safety: Goal: Ability to remain free from injury will improve Outcome: Progressing   Problem: Skin Integrity: Goal: Risk for impaired skin integrity will decrease Outcome: Progressing   Problem: Education: Goal: Verbalization of understanding the information provided (i.e., activity precautions, restrictions, etc) will improve Outcome: Progressing Goal: Individualized Educational Video(s) Outcome: Progressing   Problem: Activity: Goal: Ability to ambulate and perform ADLs will improve Outcome: Progressing   Problem: Clinical Measurements: Goal: Postoperative complications will be avoided or minimized Outcome: Progressing   Problem: Self-Concept: Goal: Ability to maintain and perform role responsibilities to the fullest extent possible will improve Outcome: Progressing   Problem: Pain Management: Goal: Pain level will decrease Outcome: Progressing

## 2023-03-09 DIAGNOSIS — S72001A Fracture of unspecified part of neck of right femur, initial encounter for closed fracture: Secondary | ICD-10-CM | POA: Diagnosis not present

## 2023-03-09 DIAGNOSIS — I959 Hypotension, unspecified: Secondary | ICD-10-CM | POA: Diagnosis not present

## 2023-03-09 LAB — CBC
HCT: 28.7 % — ABNORMAL LOW (ref 36.0–46.0)
Hemoglobin: 9.2 g/dL — ABNORMAL LOW (ref 12.0–15.0)
MCH: 32.1 pg (ref 26.0–34.0)
MCHC: 32.1 g/dL (ref 30.0–36.0)
MCV: 100 fL (ref 80.0–100.0)
Platelets: 274 10*3/uL (ref 150–400)
RBC: 2.87 MIL/uL — ABNORMAL LOW (ref 3.87–5.11)
RDW: 15.1 % (ref 11.5–15.5)
WBC: 13.8 10*3/uL — ABNORMAL HIGH (ref 4.0–10.5)
nRBC: 1.2 % — ABNORMAL HIGH (ref 0.0–0.2)

## 2023-03-09 LAB — BASIC METABOLIC PANEL
Anion gap: 16 — ABNORMAL HIGH (ref 5–15)
BUN: 57 mg/dL — ABNORMAL HIGH (ref 8–23)
CO2: 24 mmol/L (ref 22–32)
Calcium: 9.2 mg/dL (ref 8.9–10.3)
Chloride: 94 mmol/L — ABNORMAL LOW (ref 98–111)
Creatinine, Ser: 6.44 mg/dL — ABNORMAL HIGH (ref 0.44–1.00)
GFR, Estimated: 6 mL/min — ABNORMAL LOW (ref >60)
Glucose, Bld: 124 mg/dL — ABNORMAL HIGH (ref 70–99)
Potassium: 5.1 mmol/L (ref 3.5–5.1)
Sodium: 134 mmol/L — ABNORMAL LOW (ref 135–145)

## 2023-03-09 LAB — GLUCOSE, CAPILLARY
Glucose-Capillary: 138 mg/dL — ABNORMAL HIGH (ref 70–99)
Glucose-Capillary: 123 mg/dL — ABNORMAL HIGH (ref 70–99)
Glucose-Capillary: 115 mg/dL — ABNORMAL HIGH (ref 70–99)
Glucose-Capillary: 210 mg/dL — ABNORMAL HIGH (ref 70–99)
Glucose-Capillary: 219 mg/dL — ABNORMAL HIGH (ref 70–99)
Glucose-Capillary: 192 mg/dL — ABNORMAL HIGH (ref 70–99)

## 2023-03-09 LAB — HEMOGLOBIN AND HEMATOCRIT, BLOOD
HCT: 32.5 % — ABNORMAL LOW (ref 36.0–46.0)
Hemoglobin: 10.3 g/dL — ABNORMAL LOW (ref 12.0–15.0)

## 2023-03-09 LAB — TYPE AND SCREEN
ABO/RH(D): A POS
Antibody Screen: NEGATIVE

## 2023-03-09 LAB — MRSA NEXT GEN BY PCR, NASAL: MRSA by PCR Next Gen: NOT DETECTED

## 2023-03-09 MED ORDER — LIDOCAINE HCL URETHRAL/MUCOSAL 2 % EX GEL
1.0000 | CUTANEOUS | Status: DC
  Administered 2023-03-10 – 2023-03-11 (×3): 1 via TOPICAL
  Filled 2023-03-09 (×2): qty 6

## 2023-03-09 MED ORDER — SODIUM CHLORIDE 0.9 % IV SOLN
250.0000 mL | INTRAVENOUS | Status: AC
  Administered 2023-03-09: 250 mL via INTRAVENOUS

## 2023-03-09 MED ORDER — MIDODRINE HCL 5 MG PO TABS
10.0000 mg | ORAL_TABLET | Freq: Every day | ORAL | Status: DC
  Administered 2023-03-09: 10 mg via ORAL
  Filled 2023-03-09: qty 2

## 2023-03-09 MED ORDER — ORAL CARE MOUTH RINSE
15.0000 mL | OROMUCOSAL | Status: DC | PRN
  Administered 2023-03-12: 15 mL via OROMUCOSAL

## 2023-03-09 MED ORDER — MUSCLE RUB 10-15 % EX CREA
TOPICAL_CREAM | CUTANEOUS | Status: DC | PRN
  Administered 2023-03-12: 1 via TOPICAL
  Filled 2023-03-09 (×2): qty 85

## 2023-03-09 MED ORDER — ACETAMINOPHEN 325 MG PO TABS: 650.0000 mg | ORAL_TABLET | Freq: Four times a day (QID) | ORAL | Status: DC | PRN

## 2023-03-09 MED ORDER — SODIUM CHLORIDE 0.9 % IV BOLUS
250.0000 mL | Freq: Once | INTRAVENOUS | Status: AC
  Administered 2023-03-09: 250 mL via INTRAVENOUS

## 2023-03-09 MED ORDER — MIDODRINE HCL 5 MG PO TABS
10.0000 mg | ORAL_TABLET | Freq: Three times a day (TID) | ORAL | Status: DC
  Administered 2023-03-09 – 2023-03-11 (×5): 10 mg via ORAL
  Filled 2023-03-09 (×5): qty 2

## 2023-03-09 MED ORDER — MIDODRINE HCL 5 MG PO TABS
5.0000 mg | ORAL_TABLET | Freq: Three times a day (TID) | ORAL | Status: DC
  Administered 2023-03-09: 5 mg via ORAL
  Filled 2023-03-09: qty 1

## 2023-03-09 MED ORDER — MIDODRINE HCL 5 MG PO TABS
5.0000 mg | ORAL_TABLET | Freq: Once | ORAL | Status: AC
  Administered 2023-03-09: 5 mg via ORAL
  Filled 2023-03-09: qty 1

## 2023-03-09 MED ORDER — LACTATED RINGERS IV BOLUS
500.0000 mL | Freq: Once | INTRAVENOUS | Status: AC
  Administered 2023-03-09: 500 mL via INTRAVENOUS

## 2023-03-09 MED ORDER — OXYCODONE HCL 5 MG PO TABS
5.0000 mg | ORAL_TABLET | Freq: Once | ORAL | Status: AC
Start: 2023-03-09 — End: 2023-03-09
  Administered 2023-03-09: 5 mg via ORAL

## 2023-03-09 MED ORDER — OXYCODONE HCL 5 MG PO TABS
5.0000 mg | ORAL_TABLET | Freq: Once | ORAL | Status: AC
  Administered 2023-03-09: 5 mg via ORAL
  Filled 2023-03-09: qty 1

## 2023-03-09 MED ORDER — ORAL CARE MOUTH RINSE
15.0000 mL | OROMUCOSAL | Status: DC
  Administered 2023-03-09 – 2023-03-12 (×11): 15 mL via OROMUCOSAL

## 2023-03-09 MED ORDER — NOREPINEPHRINE 4 MG/250ML-% IV SOLN
0.0000 ug/min | INTRAVENOUS | Status: DC
  Administered 2023-03-09: 2 ug/min via INTRAVENOUS
  Administered 2023-03-10: 6 ug/min via INTRAVENOUS
  Administered 2023-03-11 – 2023-03-12 (×2): 2 ug/min via INTRAVENOUS
  Administered 2023-03-13: 6 ug/min via INTRAVENOUS
  Filled 2023-03-09 (×5): qty 250

## 2023-03-09 NOTE — Progress Notes (Signed)
 Stephanie Woodward  WUJ:[id number] DOB: October 02, 1940 DOA: 03/02/2023 PCP: [facility]    Brief Narrative:  83 year old with a history of ESRD on HD MWF, dementia, DM2, HTN, HLD, CHF, chronic atrial fibrillation on Eliquis , COPD, obesity, and OSA who was brought to the hospital 1/8 after suffering a simple mechanical fall with complaints of severe right hip pain.  In the ER x-rays noted a closed displaced fracture of the right femoral neck.  Goals of Care:   Code Status: Full Code   DVT prophylaxis: apixaban  (ELIQUIS ) tablet 2.5 mg Start: 03/06/23 1000 SCDs Start: 03/03/23 1402 Place and maintain sequential compression device Start: 03/02/23 1443 apixaban  (ELIQUIS ) tablet 2.5 mg   Interim Hx: Afebrile.  The patient is alert and conversant.  She complains of ongoing buttock pain.  Over the course of the day her systolic blood pressure has steadily dropped from 90-80 and now is consistently down into the low 70s.  She has become tachycardic.  There has not been a change in her mental status.  She is not febrile.  There is no shortness of breath or chest pain.  A CBC has been ordered with the results still pending at present.  Assessment & Plan:  Persisting acute worsening hypotension in setting of chronic hypertension Home dose of midodrine  has been increased -the patient has been challenged with IV fluid boluses -CBC pending to evaluate for acute anemia associated hypotension -transferred to ICU for closer monitoring and potential need for pressors  Goals of care I met with the patient's 2 daughters at bedside and discussed my concern that the patient's decline today could be an indication of a poor ability to recover from her surgery which could be an indication of an eventual decline to death -we spoke about goals of care -they report that the patient does not have a formal power of attorney at present but that they are her family and her caregivers -they state  that she has been very clear she would "want everything done" including intubation/mechanical ventilation if she were to decline to the point that this was indicated  Closed displaced fracture right femoral neck status post mechanical fall Status post right hip hemiarthroplasty 1/9 per Dr. Guyann Leitz -postop care per orthopedics  Weightbearing: WBAT RLE ROM: posterior hip precautions R hip  Insicional and dressing care: Daily dressing changes with mepilex or 4x4 gauze and tape starting on 03/05/2022 Pain management: multimodal  [id number] abx  Impediments to Fracture Healing: CKD, DM, fragility fracture Bone Health/Optimization: metabolic bone disease per renal  VTE prophylaxis:  ok to resume home eliquis   Dispo: pending.  Await PT/OT recs, may need SNF  Follow - up plan: 2 weeks Contact information:  Hardy Lia MD, Marisela Sicks PA-C  Acute blood loss anemia Last CBC check was 1/13 at which time hemoglobin was 9.0 -repeat CBC pending with high likelihood patient will require transfusion  ESRD on HD MWF Ongoing dialysis per nephrology team -given her current blood pressures it is not likely she would tolerate hemodialysis -transfer to ICU in case CRRT is required  Chronic combined systolic and diastolic CHF Volume management per HD -no evidence of gross volume overload at present  Chronic hypotension Continue midodrine  -see discussion above -midodrine  dose has been increased  DM2 CBG currently well-controlled  Dementia with intermittent acute delirium The patient appears to be at her baseline presently such that she can interact with the examiner and answer all questions reliably  Chronic paroxysmal atrial fibrillation  Continue usual amiodarone  and Eliquis  -Eliquis  may have to be discontinued and possibly even reversed if results of CBC suggest a significant drop in hemoglobin  Impaired mobility Wheelchair bound at baseline but typically able to transfer out of bed to chair -resume  PT/OT when hemodynamically stable  Pressure injury Pressure Injury 03/05/23 Buttocks Left Stage 2 -  Partial thickness loss of dermis presenting as a shallow open injury with a red, pink wound bed without slough. Pink wound bed, no slough. MASD from incontinence, blister (Active)  03/05/23 2025  Location: Buttocks  Location Orientation: Left  Staging: Stage 2 -  Partial thickness loss of dermis presenting as a shallow open injury with a red, pink wound bed without slough.  Wound Description (Comments): Pink wound bed, no slough. MASD from incontinence, blister  Present on Admission:      Pressure Injury 03/08/23 Buttocks Right Stage 3 -  Full thickness tissue loss. Subcutaneous fat may be visible but bone, tendon or muscle are NOT exposed. DTPI that has evolved to stage 3 R buttock 50% pink moist 50% tan fibrinous (Active)  03/08/23 1559  Location: Buttocks  Location Orientation: Right  Staging: Stage 3 -  Full thickness tissue loss. Subcutaneous fat may be visible but bone, tendon or muscle are NOT exposed.  Wound Description (Comments): DTPI that has evolved to stage 3 R buttock 50% pink moist 50% tan fibrinous  Present on Admission: No      Family Communication: Spoke with 2 daughters/her caregivers at bedside Disposition: Transferred to ICU for close monitoring of hypotension   Objective: Blood pressure (!) 80/52, pulse 87, temperature 97.9 F (36.6 C), temperature source Oral, resp. rate 18, height 5\' 2"  (1.575 m), weight 75.8 kg, SpO2 99%. No intake or output data in the 24 hours ending 03/09/23 0916 Filed Weights   03/03/23 0656 03/07/23 0927 03/07/23 1321  Weight: 76 kg 77.8 kg 75.8 kg    Examination: General: No acute respiratory distress Lungs: Clear to auscultation bilaterally without wheezes or crackles Cardiovascular: Tachycardic but regular without rub Abdomen: Nontender, nondistended, soft, bowel sounds positive, no rebound, no ascites, no appreciable  mass Extremities: No significant cyanosis, clubbing, or edema bilateral lower extremities  CBC: Recent Labs  Lab 03/05/23 0424 03/06/23 0450 03/07/23 0439  WBC 13.4* 13.3* 14.8*  NEUTROABS  --   --  12.1*  HGB 8.6* 9.7* 9.0*  HCT 26.7* 30.8* 27.8*  MCV 98.5 99.4 98.9  PLT 161 189 204   Basic Metabolic Panel: Recent Labs  Lab 03/05/23 0424 03/07/23 0439 03/09/23 0710  NA 136 133* 134*  K 4.2 5.1 5.1  CL 97* 95* 94*  CO2 27 24 24   GLUCOSE 185* 164* 124*  BUN 30* 70* 57*  CREATININE 4.59* 8.19* 6.44*  CALCIUM  8.6* 9.0 9.2   GFR: Estimated Creatinine Clearance: 6.6 mL/min (A) (by C-G formula based on SCr of 6.44 mg/dL (H)).   Scheduled Meds:  (feeding supplement) PROSource Plus  30 mL Oral BID BM   acetaminophen   1,000 mg Oral TID   amiodarone   200 mg Oral Daily   apixaban   2.5 mg Oral BID   atorvastatin   80 mg Oral QHS   Chlorhexidine  Gluconate Cloth  6 each Topical Q0600   darbepoetin (ARANESP ) injection - DIALYSIS  60 mcg Subcutaneous Q Fri-1800   insulin  aspart  0-5 Units Subcutaneous QHS   insulin  aspart  0-9 Units Subcutaneous TID WC   insulin  glargine-yfgn  20 Units Subcutaneous Daily   leptospermum manuka honey  1 Application Topical Daily   lidocaine   1 patch Transdermal Q24H   liver oil-zinc  oxide   Topical BID   melatonin  3 mg Oral QHS   midodrine   5 mg Oral TID WC   multivitamin  1 tablet Oral QHS   pantoprazole   40 mg Oral BID   senna-docusate  1 tablet Oral QHS     LOS: 7 days   Abbe Abate, MD [facility] Office  [phone] Pager - Text Page per [place] Foley  If 7PM-7AM, please contact night-coverage per Amion 03/09/2023, 9:16 AM

## 2023-03-09 NOTE — Significant Event (Signed)
 Rapid Response Event Note   Reason for Call :  Hypotension 57/47  Initial Focused Assessment:  Patient is alert and oriented x 2.  She knows she is in the hospital but thinks she is here bc she broke her wrist.  Apparently that was from her previous fall, per her daughter.  This admit she broke her hip. She complains of back pain. She is warm and dry. Lung sounds decreased bases. Heart tones irregular.  BP 65/48-71/46  AF 90-120  RR 18-24  O2 sat  99% on 2L [place]  Daughter at bedside  Interventions:  500cc NS bolus She has also received her midodrine  PO  CCM consulted Goals of care conversation with patient  Tx to ICU   Plan of Care:     Event Summary:   MD Notified: Dr Jonelle Neri at bedside Call Time: 1548 Arrival Time: 1553 End Time: 1725  Waldemar Guillaume, RN

## 2023-03-09 NOTE — Progress Notes (Signed)
 MEWS Progress Note  Patient Details Name: Stephanie Woodward MRN: [id number] DOB: [birth year] Today's Date: 03/09/2023   MEWS Flowsheet Documentation:  Assess: MEWS Score Temp: 97.8 F (36.6 C) BP: (!) 73/55 MAP (mmHg): (!) 62 Pulse Rate: 89 ECG Heart Rate: 89 Resp: (!) 6 Level of Consciousness: Alert SpO2: 99 % O2 Device: Nasal Cannula O2 Flow Rate (L/min): 2 L/min Assess: MEWS Score MEWS Temp: 0 MEWS Systolic: 2 MEWS Pulse: 0 MEWS RR: 2 MEWS LOC: 0 MEWS Score: 4 MEWS Score Color: Red Assess: SIRS CRITERIA SIRS Temperature : 0 SIRS Respirations : 0 SIRS Pulse: 0 SIRS WBC: 0 SIRS Score Sum : 0 SIRS Temperature : 0 SIRS Pulse: 0 SIRS Respirations : 0 SIRS WBC: 0 SIRS Score Sum : 0 Assess: if the MEWS score is Yellow or Red Were vital signs accurate and taken at a resting state?: Yes Does the patient meet 2 or more of the SIRS criteria?: No MEWS guidelines implemented : Yes, red Treat MEWS Interventions: Considered administering scheduled or prn medications/treatments as ordered Take Vital Signs Increase Vital Sign Frequency : Red: Q1hr x2, continue Q4hrs until patient remains green for 12hrs Escalate MEWS: Escalate: Red: Discuss with charge nurse and notify provider. Consider notifying RRT. If remains red for 2 hours consider need for higher level of care Notify: Charge Nurse/RN Name of Charge Nurse/RN Notified: Angelito Provider Notification Provider Name/Title: Jonelle Neri Date Provider Notified: 03/09/23 Time Provider Notified: 1320 Method of Notification: Call Notification Reason: Other (Comment) (BP low) Provider response: See new orders (increase midodine and check Hg) Date of Provider Response: 03/09/23 Time of Provider Response: 1320    Patient red mews primarily due to low BP, MD to increase midodrine  and check CBC prior to dialysis.  Call phlebotomy to come draw.   Carolann Chum 03/09/2023, 1:49 PM

## 2023-03-09 NOTE — Progress Notes (Signed)
 eLink Physician-Brief Progress Note Patient Name: Stephanie Woodward DOB: 1940/11/16 MRN: [id number]   Date of Service  03/09/2023  HPI/Events of Note    eICU Interventions  Lidocaine  cream to AVFprior to HD     Intervention Category Minor Interventions: Routine modifications to care plan (e.g. PRN medications for pain, fever)  Stephanie Woodward. Stephanie Woodward 03/09/2023, 11:18 PM

## 2023-03-09 NOTE — Progress Notes (Signed)
 Patient having low BP in spite of increased Midodrine  and bolus.  MD came to bedside to evaluate and moving to ICU for possible pressures.

## 2023-03-09 NOTE — Progress Notes (Signed)
 Occupational Therapy Treatment Patient Details Name: Stephanie Woodward MRN: [id number] DOB: [birth year] Today's Date: 03/09/2023   History of present illness 83 y.o. female presents to [facility] 03/02/23 after falling out of bed  w/ R femoral neck fx. S/p hemiarthroplasty of R hip 1/9. PMHx: arthritis, a-fib, CHF, CKD, COPD, DM, gout, HTN   OT comments  Patient demonstrating limited gains with bed mobility, sitting balance, and sit to stands. Patient now able to roll in bed with mod assist.Patient requiring max to total assist +2 for sit to stands into [place]. Patient quickly fatigues and demonstrated increase in HR from 118 to 130's. Patient will benefit from continued inpatient follow up therapy, <3 hours/day to increase independence in bed mobility, transfers, and self care. Acute OT to continue to follow.       If plan is discharge home, recommend the following:  Two people to help with walking and/or transfers;Two people to help with bathing/dressing/bathroom   Equipment Recommendations  Other (comment) (defer)    Recommendations for Other Services      Precautions / Restrictions Precautions Precautions: Posterior Hip;Fall Precaution Comments: reviewed posterior hip precautions. Restrictions Weight Bearing Restrictions Per Provider Order: Yes RLE Weight Bearing Per Provider Order: Weight bearing as tolerated       Mobility Bed Mobility Overal bed mobility: Needs Assistance Bed Mobility: Supine to Sit, Sit to Supine, Rolling Rolling: Mod assist, Used rails   Supine to sit: Total assist, +2 for physical assistance Sit to supine: Total assist, +2 for physical assistance   General bed mobility comments: helicopter method using bed pad    Transfers Overall transfer level: Needs assistance Equipment used: Ambulation equipment used Transfers: Sit to/from Stand Sit to Stand: Total assist, +2 physical assistance, Via lift equipment, Max assist           General transfer comment:  patient able to stand from EOB into stedy with use of bed pad and gait belt, total assist +2 following 2 stands with partial stands and patietn demonstrating forward flexion Transfer via Lift Equipment: St[place]   Balance Overall balance assessment: Needs assistance Sitting-balance support: Bilateral upper extremity supported, Feet supported Sitting balance-Leahy Scale: Poor Sitting balance - Comments: MinA to maintain balance, left lateral lean Postural control: Left lateral lean Standing balance support: Bilateral upper extremity supported, During functional activity, Reliant on assistive device for balance Standing balance-Leahy Scale: Zero                             ADL either performed or assessed with clinical judgement   ADL Overall ADL's : Needs assistance/impaired                                       General ADL Comments: focused on bed mobility, sitting balance, and sit to stands    Extremity/Trunk Assessment              Vision       Perception     Praxis      Cognition Arousal: Alert Behavior During Therapy: WFL for tasks assessed/performed Overall Cognitive Status: History of cognitive impairments - at baseline                                 General Comments: pleasant and eager to participate  Exercises      Shoulder Instructions       General Comments HR jumps from 130 BPM to 110 BPM. RN present in room at end of session and aware. Educated family member on repositioning every 2 hours    Pertinent Vitals/ Pain       Pain Assessment Pain Assessment: Faces Faces Pain Scale: Hurts even more Pain Location: R hip and buttocks with movement Pain Descriptors / Indicators: Discomfort, Grimacing, Guarding Pain Intervention(s): Limited activity within patient's tolerance, Monitored during session, Repositioned  Home Living                                          Prior  Functioning/Environment              Frequency  Min 1X/week        Progress Toward Goals  OT Goals(current goals can now be found in the care plan section)  Progress towards OT goals: Progressing toward goals  Acute Rehab OT Goals Patient Stated Goal: get better OT Goal Formulation: With patient/family Time For Goal Achievement: 03/18/23 Potential to Achieve Goals: Fair ADL Goals Pt Will Perform Grooming: with set-up;sitting Pt Will Perform Upper Body Dressing: with set-up;sitting Pt Will Transfer to Toilet: with mod assist;stand pivot transfer;bedside commode Pt Will Perform Toileting - Clothing Manipulation and hygiene: with mod assist;sitting/lateral leans;sit to/from stand Additional ADL Goal #1: Pt will complete bed mobility with mod assist.  Plan      Co-evaluation    PT/OT/SLP Co-Evaluation/Treatment: Yes Reason for Co-Treatment: For patient/therapist safety;To address functional/ADL transfers PT goals addressed during session: Mobility/safety with mobility;Balance;Proper use of DME OT goals addressed during session: Strengthening/ROM      AM-PAC OT "6 Clicks" Daily Activity     Outcome Measure   Help from another person eating meals?: A Lot Help from another person taking care of personal grooming?: A Lot Help from another person toileting, which includes using toliet, bedpan, or urinal?: Total Help from another person bathing (including washing, rinsing, drying)?: A Lot Help from another person to put on and taking off regular upper body clothing?: A Lot Help from another person to put on and taking off regular lower body clothing?: Total 6 Click Score: 10    End of Session Equipment Utilized During Treatment: Oxygen;Other (comment) Octaviano Belts)  OT Visit Diagnosis: Other abnormalities of gait and mobility (R26.89);Muscle weakness (generalized) (M62.81);Pain Pain - Right/Left: Right Pain - part of body: Hip   Activity Tolerance Patient tolerated  treatment well   Patient Left in bed;with call bell/phone within reach;with bed alarm set;with family/visitor present   Nurse Communication Mobility status        Time: 1610-9604 OT Time Calculation (min): 27 min  Charges: OT General Charges $OT Visit: 1 Visit OT Treatments $Therapeutic Activity: 8-22 mins  Anitra Barn, OTA Acute Rehabilitation Services  Office [phone]   Jovita Nipper 03/09/2023, 11:47 AM

## 2023-03-09 NOTE — Progress Notes (Signed)
 MEWS Progress Note  Patient Details Name: Carys Neumeister MRN: [id number] DOB: 01/17/1941 Today's Date: 03/09/2023   MEWS Flowsheet Documentation:  Assess: MEWS Score Temp: 97.9 F (36.6 C) BP: (!) 85/59 MAP (mmHg): 67 Pulse Rate: 87 ECG Heart Rate: 84 Resp: (!) 9 Level of Consciousness: Alert SpO2: 99 % O2 Device: Nasal Cannula O2 Flow Rate (L/min): 2 L/min Assess: MEWS Score MEWS Temp: 0 MEWS Systolic: 1 MEWS Pulse: 0 MEWS RR: 1 MEWS LOC: 0 MEWS Score: 2 MEWS Score Color: Yellow Assess: SIRS CRITERIA SIRS Temperature : 0 SIRS Respirations : 0 SIRS Pulse: 0 SIRS WBC: 0 SIRS Score Sum : 0 SIRS Temperature : 0 SIRS Pulse: 0 SIRS Respirations : 0 SIRS WBC: 0 SIRS Score Sum : 0 Assess: if the MEWS score is Yellow or Red Were vital signs accurate and taken at a resting state?: Yes Does the patient meet 2 or more of the SIRS criteria?: No MEWS guidelines implemented : No, previously yellow, continue vital signs every 4 hours   Patient has history of low BP, received 250 bolus and midodrine .  MD has ordered TID midodrine  to replace AM midodrine  with dialysis.  Will continue to monitor BP      Carolann Chum 03/09/2023, 11:29 AM

## 2023-03-09 NOTE — Consult Note (Signed)
 NAME:  Stephanie Woodward, MRN:  [id number], DOB:  1941-01-07, LOS: 7 ADMISSION DATE:  03/02/2023, CONSULTATION DATE: 1/15  REFERRING MD: Jonelle Neri CHIEF COMPLAINT:  Hip pain   History of Present Illness:  Pt is an 83 yr old female with significant pmhx of cognitive impairment/dementia, ESRD (MWF), DM2, HTN, HLD, CHF, and chronic afib on eliquis , COPD, OSA, and obesity who presented to ED with complaints of right hip pain after a fall on 1/8. Per ED work up, a closed displaced right femoral neck fracture was found and Ortho was consulted. Patient underwent a right hip hemiarthroplasty on 1/9 by Dr. Guyann Leitz. On 1/15 patient progressive had worsening hypotension on top of chronic hypotension (receiving midodrine ) and was given 500 cc fluid bolus challenge but hypotension remaining refractory. Due to ongoing hypotension and increase in tachycardia compared to baseline, PCCM was consulted to assist in management for hypotension.   Upon assessment at beside, patient was alert and oriented x 4 and able to answer questions appropriate despite hx of cognitive impairment. Discussed with patient the possible need of vasopressor support due to ongoing hypotension. Also, had GOC with patient and daughters at beside. Discussed extensively with patient and daughters against CPR, defibrillation, and mechanical ventilation in the event of cardiopulmonary arrest. We would continue full care but in the event of cardiac and or respiratory arrest we would not perform interventions just mentioned. Patient expressed she would not want to suffer if did have a cardiac or respiratory arrest and would want to be made DNR/DNI. This was witnessed by daughters at beside, along with PCCM MD [place].   Pertinent  Medical History   Past Medical History:  Diagnosis Date   Arthritis    Asthma    Atrial fibrillation (HCC)    CHF (congestive heart failure) (HCC)    CKD (chronic kidney disease), stage III (HCC)    COPD (chronic obstructive  pulmonary disease) (HCC)    Diabetes mellitus    INSULIN  DEPENDENT   Dialysis patient (HCC)    Gout    Heart murmur    no issues per pt   History of kidney stones    Hyperlipemia    Hypertension    Psoriasis    Renal disorder    congenital   Single kidney    Sleep apnea    doesn't use the Cpap     Significant Hospital Events: Including procedures, antibiotic start and stop dates in addition to other pertinent events   1/8 Mechanical fall, admit with right hip femoral neck fracture 1/9 Hemiarthroplasty of right hip fracture  1/15 PCCM consult due to ongoing hypotension   Interim History / Subjective:  Patient pleasant, alert and able to answer questions appropriate BP labile   Objective   Blood pressure (!) 71/46, pulse 89, temperature 98.2 F (36.8 C), temperature source Oral, resp. rate 13, height 5\' 2"  (1.575 m), weight 75.8 kg, SpO2 99%.       No intake or output data in the 24 hours ending 03/09/23 1633 Filed Weights   03/03/23 0656 03/07/23 0927 03/07/23 1321  Weight: 76 kg 77.8 kg 75.8 kg    Examination: General: chronically ill older female, lying on progressive bed HENT: Normocephalic, poor dentition-missing teeth, pink MM Lungs: clr diminished throughout lung fields, no distress Cardiovascular: s1,s2 irregular, afib tachy 120s, no JVD, no MRG Abdomen: BS active, obese, soft Extremities: follows commands, weak, generalized edema Neuro: alert, oriented x4, follows commands, PERRLA intact GU: deferred   Resolved Hospital Problem list  N/a   Assessment & Plan:  Acute Hypotension  Chronic Hypotension hx ESRD (MWF) S/p right hemiarthroplasty of right hip fracture BP labile, reposition cuff on patient's left arm, SBP >100  MAP >65, despite ongoing hypotension-patient is AOx4, following commands, not lethargic. Patient is slightly tachycardic which is different from baseline. Patient takes midodrine  at home and currently receiving during this admission.   Site of s/p hip hemiarthroplasty has old drainage but no signs of active bleeding Hgb stable P: Transfer to ICU, monitor on cardiac monitoring MAP goal >65, may start peripheral levophed  Send type and Screen  Serial CBCs  Discussed code status change to DNR/DNI  Transfuse if hgb < 7  Continue to monitor for signs of bleeding  Nephro and ortho following  Will monitor in ICU, if not needing pressors can transfer back to progressive unit on 1/16 If hypotension remains, can consider increasing midodrine  to 15mg  TID     Best Practice-Per primary     Labs   CBC: Recent Labs  Lab 03/03/23 0740 03/04/23 0611 03/05/23 0424 03/06/23 0450 03/07/23 0439  WBC  --  14.2* 13.4* 13.3* 14.8*  NEUTROABS  --   --   --   --  12.1*  HGB 12.9 9.7* 8.6* 9.7* 9.0*  HCT 38.0 30.3* 26.7* 30.8* 27.8*  MCV  --  99.0 98.5 99.4 98.9  PLT  --  174 161 189 204    Basic Metabolic Panel: Recent Labs  Lab 03/03/23 0740 03/04/23 0611 03/05/23 0424 03/07/23 0439 03/09/23 0710  NA 135 135 136 133* 134*  K 4.4 4.4 4.2 5.1 5.1  CL 95* 96* 97* 95* 94*  CO2  --  26 27 24 24   GLUCOSE 158* 211* 185* 164* 124*  BUN 21 38* 30* 70* 57*  CREATININE 5.30* 6.75* 4.59* 8.19* 6.44*  CALCIUM   --  9.1 8.6* 9.0 9.2   GFR: Estimated Creatinine Clearance: 6.6 mL/min (A) (by C-G formula based on SCr of 6.44 mg/dL (H)). Recent Labs  Lab 03/04/23 0611 03/05/23 0424 03/06/23 0450 03/07/23 0439  WBC 14.2* 13.4* 13.3* 14.8*    Liver Function Tests: No results for input(s): "AST", "ALT", "ALKPHOS", "BILITOT", "PROT", "ALBUMIN " in the last 168 hours. No results for input(s): "LIPASE", "AMYLASE" in the last 168 hours. No results for input(s): "AMMONIA" in the last 168 hours.  ABG    Component Value Date/Time   PHART 7.343 (L) 02/22/2021 0830   PCO2ART 43.9 02/22/2021 0830   PO2ART 65 (L) 02/22/2021 0830   HCO3 24.0 02/22/2021 0830   TCO2 31 03/03/2023 0740   ACIDBASEDEF 2.0 02/22/2021 0830   O2SAT 92.0  02/22/2021 0830     Coagulation Profile: No results for input(s): "INR", "PROTIME" in the last 168 hours.  Cardiac Enzymes: No results for input(s): "CKTOTAL", "CKMB", "CKMBINDEX", "TROPONINI" in the last 168 hours.  HbA1C: Hgb A1c MFr Bld  Date/Time Value Ref Range Status  03/02/2023 06:34 AM 6.8 (H) 4.8 - 5.6 % Final    Comment:    (NOTE)         Prediabetes: 5.7 - 6.4         Diabetes: >6.4         Glycemic control for adults with diabetes: <7.0   05/19/2022 09:50 PM 7.9 (H) 4.8 - 5.6 % Final    Comment:    (NOTE)         Prediabetes: 5.7 - 6.4         Diabetes: >6.4  Glycemic control for adults with diabetes: <7.0     CBG: Recent Labs  Lab 03/08/23 1623 03/08/23 2035 03/09/23 0613 03/09/23 0825 03/09/23 1114  GLUCAP 267* 203* 138* 123* 115*    Review of Systems:   See hpi   Past Medical History:  She,  has a past medical history of Arthritis, Asthma, Atrial fibrillation (HCC), CHF (congestive heart failure) (HCC), CKD (chronic kidney disease), stage III (HCC), COPD (chronic obstructive pulmonary disease) (HCC), Diabetes mellitus, Dialysis patient (HCC), Gout, Heart murmur, History of kidney stones, Hyperlipemia, Hypertension, Psoriasis, Renal disorder, Single kidney, and Sleep apnea.   Surgical History:   Past Surgical History:  Procedure Laterality Date   A/V FISTULAGRAM Left 10/31/2020   Procedure: A/V FISTULAGRAM;  Surgeon: Dannis Dy, MD;  Location: [facility] INVASIVE CV LAB;  Service: Cardiovascular;  Laterality: Left;   A/V FISTULAGRAM N/A 03/04/2023   Procedure: A/V Fistulagram;  Surgeon: Patrick Boor, MD;  Location: [facility] INVASIVE CV LAB;  Service: Cardiovascular;  Laterality: N/A;   ABDOMINAL HYSTERECTOMY     AV FISTULA PLACEMENT Left 11/17/2018   Procedure: BRACHIO-CEPHALIC ARTERIOVENOUS (AV) FISTULA CREATION IN LEFT ARM;  Surgeon: Young Hensen, MD;  Location[facility] OR;  Service: Vascular;  Laterality: Left;   CARDIOVERSION N/A 03/03/2021    Procedure: CARDIOVERSION;  Surgeon: Darlis Eisenmenger, MD;  Location: [facility] ENDOSCOPY;  Service: Cardiovascular;  Laterality: N/A;   CARDIOVERSION N/A 03/12/2021   Procedure: CARDIOVERSION;  Surgeon: Darlis Eisenmenger, MD;  Location: [facility] ENDOSCOPY;  Service: Cardiovascular;  Laterality: N/A;   CHOLECYSTECTOMY     HIP ARTHROPLASTY Right 03/03/2023   Procedure: ARTHROPLASTY BIPOLAR HIP (HEMIARTHROPLASTY) POSTERIOR;  Surgeon: Hardy Lia, MD;  Location: [facility] OR;  Service: Orthopedics;  Laterality: Right;   INSERTION OF DIALYSIS CATHETER Right 01/26/2019   Procedure: INSERTION OF DIALYSIS CATHETER, right internal jugular;  Surgeon: Dannis Dy, MD;  Location: [facility] OR;  Service: Vascular;  Laterality: Right;   LEFT AND RIGHT HEART CATHETERIZATION WITH CORONARY ANGIOGRAM N/A 11/29/2013   Procedure: LEFT AND RIGHT HEART CATHETERIZATION WITH CORONARY ANGIOGRAM;  Surgeon: Dorsey Gault, MD;  Location: [facility] CATH LAB;  Service: Cardiovascular;  Laterality: N/A;   LEFT HEART CATH AND CORONARY ANGIOGRAPHY N/A 02/24/2021   Procedure: LEFT HEART CATH AND CORONARY ANGIOGRAPHY;  Surgeon: Darlis Eisenmenger, MD;  Location: [facility] INVASIVE CV LAB;  Service: Cardiovascular;  Laterality: N/A;   PERIPHERAL VASCULAR INTERVENTION Left 10/31/2020   Procedure: PERIPHERAL VASCULAR INTERVENTION;  Surgeon: Dannis Dy, MD;  Location: [facility] INVASIVE CV LAB;  Service: Cardiovascular;  Laterality: Left;   RIGHT HEART CATH N/A 11/23/2017   Procedure: RIGHT HEART CATH;  Surgeon: Darlis Eisenmenger, MD;  Location: [facility] INVASIVE CV LAB;  Service: Cardiovascular;  Laterality: N/A;   RIGHT/LEFT HEART CATH AND CORONARY ANGIOGRAPHY N/A 06/15/2019   Procedure: RIGHT/LEFT HEART CATH AND CORONARY ANGIOGRAPHY;  Surgeon: Darlis Eisenmenger, MD;  Location: [facility] INVASIVE CV LAB;  Service: Cardiovascular;  Laterality: N/A;     Social History:   reports that she quit smoking about 14 years ago. Her smoking use included cigarettes. She started smoking  about 34 years ago. She has a 20 pack-year smoking history. She has never used smokeless tobacco. She reports current alcohol use. She reports that she does not currently use drugs.   Family History:  Her family history includes CVA (age of onset: 77) in her father; Cancer (age of onset: 63) in her brother; Cancer (age of onset: 20) in her mother; Lupus in  her daughter.   Allergies Allergies  Allergen Reactions   Egg-Derived Products Nausea And Vomiting   Lisinopril Nausea And Vomiting   Penicillins Nausea And Vomiting    Has patient had a PCN reaction causing immediate rash, facial/tongue/throat swelling, SOB or lightheadedness with hypotension: No Has patient had a PCN reaction causing severe rash involving mucus membranes or skin necrosis: No Has patient had a PCN reaction that required hospitalization: No Has patient had a PCN reaction occurring within the last 10 years: No If all of the above answers are "NO", then may proceed with Cephalosporin use.    Tramadol      Hallucinations, mental status changes     Home Medications  Prior to Admission medications   Medication Sig Start Date End Date Taking? Authorizing Provider  acetaminophen  (TYLENOL ) 500 MG tablet Take 1,000 mg by mouth in the morning and at bedtime.   Yes [provider]  amiodarone  (PACERONE ) 200 MG tablet Take 200 mg by mouth daily.   Yes [provider]  atorvastatin  (LIPITOR ) 80 MG tablet Take 80 mg by mouth at bedtime.   Yes [provider]  chlorhexidine  (HIBICLENS ) 4 % external liquid Apply 15 mLs (1 Application total) topically as directed for 30 doses. Use as directed daily for 5 days every other week for 6 weeks. 03/03/23  Yes Marisela Sicks, PA-C  Cholecalciferol  (VITAMIN D3) 25 MCG (1000 UT) tablet Take 1,000 Units by mouth daily.   Yes [provider]  ELIQUIS  5 MG TABS tablet Take 1 tablet by mouth twice daily Patient taking differently: Take 2.5 mg by mouth 2 (two) times  daily. 07/23/20  Yes Darlis Eisenmenger, MD  Insulin  Glargine (SEMGLEE  [place]) Inject 20 Units into the skin in the morning.   Yes [provider]  lidocaine  [facility]) 3 % CREA cream Apply 1 application  topically every Monday, Wednesday, and Friday. Prior to dialysis 03/06/20  Yes [provider]  midodrine  (PROAMATINE ) 10 MG tablet Take 10 mg by mouth 3 (three) times a week. Taken before dialysis on Monday, Wednesday, Friday Hold if SBP>130   Yes [provider]  mupirocin  ointment (BACTROBAN ) 2 % Place 1 Application into the nose 2 (two) times daily for 60 doses. Use as directed 2 times daily for 5 days every other week for 6 weeks. 03/03/23 04/02/23 Yes Marisela Sicks, PA-C  pantoprazole  (PROTONIX ) 40 MG tablet Take 1 tablet (40 mg total) by mouth 2 (two) times daily. 05/20/22  Yes Wynetta Heckle, MD  Pramoxine-Calamine (AVEENO ANTI-ITCH EX) Apply 1 application  topically every 6 (six) hours as needed (itching).   Yes [provider]  nitroGLYCERIN  (NITROSTAT ) 0.4 MG SL tablet Place 0.4 mg under the tongue every 5 (five) minutes as needed for chest pain. Patient not taking: Reported on 03/02/2023    [provider]     Critical care time: 40 mins    [facility]   [facility] 03/09/2023, 6:31 PM  Please see [email or web address] for pager details.  From 7A-7P if no response, please call [phone]. After hours, please call ELink [phone].

## 2023-03-09 NOTE — Progress Notes (Signed)
 Physical Therapy Treatment Patient Details Name: Stephanie Woodward MRN: [id number] DOB: 1940/09/04 Today's Date: 03/09/2023   History of Present Illness 83 y.o. female presents to [facility] 03/02/23 after falling out of bed  w/ R femoral neck fx. S/p hemiarthroplasty of R hip 1/9. PMHx: arthritis, a-fib, CHF, CKD, COPD, DM, gout, HTN    PT Comments  Pt in bed upon arrival with daughter present and agreeable to PT session. Worked on STS and LE strength in today's session. Pt progressed by needing ModA to roll bilaterally x4. Pt continues to need MaxAx2 to TotalAx2 for stand attempts with Stedy. Pt has forward flexed posture and is unable to stand completely upright. Pt was fatigued after x3 partial stands and returned to supine with totalAx2. Pt is progressing slowly towards goals. Continue to recommend <3hrs post acute rehab to work towards improving mobility. Acute PT to follow.      If plan is discharge home, recommend the following: A lot of help with bathing/dressing/bathroom;Assistance with cooking/housework;Assistance with feeding;Direct supervision/assist for medications management;Direct supervision/assist for financial management;Help with stairs or ramp for entrance;Assist for transportation;Two people to help with walking and/or transfers   Can travel by private vehicle     No  Equipment Recommendations  Other (comment) (TBD at next venue)       Precautions / Restrictions Precautions Precautions: Posterior Hip;Fall Precaution Comments: reviewed posterior hip precautions. Restrictions Weight Bearing Restrictions Per Provider Order: Yes RLE Weight Bearing Per Provider Order: Weight bearing as tolerated     Mobility  Bed Mobility Overal bed mobility: Needs Assistance Bed Mobility: Supine to Sit, Sit to Supine, Rolling Rolling: Mod assist, Used rails   Supine to sit: Total assist, +2 for physical assistance Sit to supine: Total assist, +2 for physical assistance   General bed  mobility comments: helicopter method using bed pad    Transfers Overall transfer level: Needs assistance Equipment used: Ambulation equipment used Transfers: Sit to/from Stand Sit to Stand: Total assist, +2 physical assistance, Via lift equipment, Max assist    General transfer comment: MaxAx2 for first STS with assist from bed pad and gait belt. Pt stays in forward flexed position. TotalAx2 for two partial STS from elevated seat surface. Transfer via Lift Equipment: Stedy       Balance Overall balance assessment: Needs assistance Sitting-balance support: Bilateral upper extremity supported, Feet supported Sitting balance-Leahy Scale: Poor Sitting balance - Comments: MinA to maintain balance, left lateral lean Postural control: Left lateral lean Standing balance support: Bilateral upper extremity supported, During functional activity, Reliant on assistive device for balance Standing balance-Leahy Scale: Zero     Cognition Arousal: Alert Behavior During Therapy: WFL for tasks assessed/performed Overall Cognitive Status: History of cognitive impairments - at baseline     Exercises General Exercises - Lower Extremity Ankle Circles/Pumps: AROM, Both, 10 reps, Supine    General Comments General comments (skin integrity, edema, etc.): HR jumps from 130 BPM to 110 BPM. RN present in room at end of session and aware. Educated family member on repositioning every 2 hours      Pertinent Vitals/Pain Pain Assessment Pain Assessment: Faces Faces Pain Scale: Hurts even more Pain Location: R hip and buttocks with movement Pain Descriptors / Indicators: Discomfort, Grimacing, Guarding Pain Intervention(s): Limited activity within patient's tolerance, Monitored during session, Premedicated before session, Repositioned     PT Goals (current goals can now be found in the care plan section) Acute Rehab PT Goals PT Goal Formulation: With patient/family Time For Goal Achievement:  03/18/23  Potential to Achieve Goals: Fair Progress towards PT goals: Progressing toward goals    Frequency    Min 1X/week           Co-evaluation   Reason for Co-Treatment: For patient/therapist safety;To address functional/ADL transfers PT goals addressed during session: Mobility/safety with mobility;Balance;Proper use of DME        AM-PAC PT "6 Clicks" Mobility   Outcome Measure  Help needed turning from your back to your side while in a flat bed without using bedrails?: A Lot Help needed moving from lying on your back to sitting on the side of a flat bed without using bedrails?: Total Help needed moving to and from a bed to a chair (including a wheelchair)?: Total Help needed standing up from a chair using your arms (e.g., wheelchair or bedside chair)?: Total Help needed to walk in hospital room?: Total Help needed climbing 3-5 steps with a railing? : Total 6 Click Score: 7    End of Session Equipment Utilized During Treatment: Oxygen;Gait belt Activity Tolerance: Patient tolerated treatment well Patient left: in bed;with call bell/phone within reach;with family/visitor present;with nursing/sitter in room Nurse Communication: Mobility status PT Visit Diagnosis: Unsteadiness on feet (R26.81);History of falling (Z91.81);Muscle weakness (generalized) (M62.81);Other abnormalities of gait and mobility (R26.89)     Time: 1610-9604 PT Time Calculation (min) (ACUTE ONLY): 28 min  Charges:    $Therapeutic Activity: 8-22 mins PT General Charges $$ ACUTE PT VISIT: 1 Visit                    Orysia Blas, PT, DPT Secure Chat Preferred  Rehab Office [phone]    Alissa April Adela Ades 03/09/2023, 9:18 AM

## 2023-03-09 NOTE — TOC Progression Note (Addendum)
 Transition of Care [facility]) - Progression Note    Patient Details  Name: Aavah Deardorff MRN: [id number] Date of Birth: 1940-09-09  [facility] CM/SW Contact  Elspeth Hals, LCSW Phone Number: 03/09/2023, 11:38 AM  Clinical Narrative:   CSW spoke with daughter Trevor Fudge.  She and her sister just left [place], are OK with accepting bed offer.    CSW confirmed with Kristal/[facility] that they can receive pt today.  They are aware of HD, [facility] will transport and they are all set with HD plans.  CSW updated [facility].  She will get ptar approval.      1315: TC [facility]: they have approved [facility] transport  Expected Discharge Plan: Skilled Nursing Facility Barriers to Discharge: Continued Medical Work up  Expected Discharge Plan and Services In-house Referral: Clinical Social Work   Post Acute Care Choice: Skilled Nursing Facility Living arrangements for the past 2 months: Apartment                                       Social Determinants of Health (SDOH) Interventions SDOH Screenings   Food Insecurity: No Food Insecurity (03/02/2023)  Housing: Low Risk  (03/02/2023)  Transportation Needs: No Transportation Needs (03/02/2023)  Utilities: Not At Risk (03/02/2023)  Depression (PHQ2-9): Low Risk  (12/03/2019)  Financial Resource Strain: Low Risk  (02/27/2021)  Social Connections: Moderately Isolated (03/02/2023)  Tobacco Use: Medium Risk (03/03/2023)    Readmission Risk Interventions     No data to display

## 2023-03-09 NOTE — Progress Notes (Signed)
 Nutrition Follow-up  DOCUMENTATION CODES:   Not applicable  INTERVENTION:   -Clear liquid diet, advance when medically appropriate to liberalized meal plan, regular due to suboptimal intakes.  -Continue Prosource Plus 30 mL PO TID, each packet provides 100 calories and 15 gm protein.  -When diet beyond clear liquids, resume Magic cup BID with meals, each supplement provides 290 kcal and 9 grams of protein.  -Consider -1 packet Juven BID, each packet provides 95 calories, 2.5 grams of protein (collagen), and 9.8 grams of carbohydrate (3 grams sugar); also contains 7 grams of L-arginine and L-glutamine, 300 mg vitamin C, 15 mg vitamin E, 1.2 mcg vitamin B-12, 9.5 mg zinc , 200 mg calcium , and 1.5 g  Calcium  Beta-hydroxy-Beta-methylbutyrate to support wound healing  -Continue Rena-Vit-1 Tab daily.      NUTRITION DIAGNOSIS:   Increased nutrient needs related to hip fracture, post-op healing as evidenced by estimated needs.  -ongoing, addressing with diet, oral supplements  GOAL:   Patient will meet greater than or equal to 90% of their needs  -progressing  MONITOR:   PO intake  REASON FOR ASSESSMENT:   Follow up  ASSESSMENT:   83 y/o female presented to the ED with right side hip pain after a fall. Uses wheelchair for mobility at baseline. Admitted with closed displaced fracture of right femoral neck.  PMH: CKD3, heart failure with preserved ejection fraction, A-fib, ESRD on HD 3x weekly, DM2, dementia, asthma, COPD, gout, heart murmur, kidney stones, HLD, HTN, psoriasis, congenital renal disorder, single kidney, sleep apnea-does not use CPAP, cholecystectomy, peripheral vascular intervention, smoking hx.  01/09-hemiarthroplasty due to right femoral neck fracture. 01/10-fistulagram  Persistent and worsening hyponatremia in setting of chronic HTN. Home dose midrione increased, received IVF bolus and per physician note, plan to transfer to ICU for closer monitoring and  possible need for pressor support.  Intakes recorded average 53% x 6 meals. Nutrition plan to liberalize diet was not completed due to multiple NPO orders at midnight. Currently diet is clear liquid. Unable to reach patient or daughter. Most recent weight is back to admit value, monitor. Edema 1+ RLE, LLE.  Medications reviewed and include Darbepoetin Alfa , novolog  SS 3x daily with meals and at bedtime, insulin  glargine-yfgn 20 units daily, Rena-Vit-1 Tab daily, PPI, Senokot-S, levophed , PRN: Nepro Carb Steady when missed meal with dialysis.  Labs: CBG 115-267 past 24 hours, potassium 5.1, BUN 57, creatinine 6.44, GFR 16    Diet Order:   Diet Order             Diet clear liquid Fluid consistency: Thin; Fluid restriction: 1200 mL Fluid  Diet effective now                   EDUCATION NEEDS:   No education needs have been identified at this time  Skin:  Skin Assessment: Skin Integrity Issues: Skin Integrity Issues:: Stage II, Stage III, Incisions Stage II: L buttock Stage III: R buttock Incisions: R leg  Last BM:  03/07/2023  Height:   Ht Readings from Last 1 Encounters:  03/03/23 5\' 2"  (1.575 m)    Weight:   Wt Readings from Last 1 Encounters:  03/07/23 75.8 kg    Ideal Body Weight:  52.3 kg  BMI:  Body mass index is 30.56 kg/m.  Estimated Nutritional Needs:   Kcal:  1450-1700 kcal/day  Protein:  63-78 gm/day  Fluid:  1200 mL/day fluid restriction    Jaycee Metro, RDLD Clinical Dietitian If unable to reach, please contact "  RD Inpatient" secure chat group between 8 am-4 pm daily"

## 2023-03-09 NOTE — Progress Notes (Signed)
  [facility] Progress Note   Subjective:    Seen in room, sleeping and awakens slowly For HD today  Objective Vitals:   03/09/23 0833 03/09/23 0900 03/09/23 1015 03/09/23 1110  BP: (!) 80/52 (!) 88/45 (!) 85/59 (!) 85/44  Pulse: 87     Resp: 18 10 (!) 9 11  Temp: 97.9 F (36.6 C)     TempSrc: Oral     SpO2: 99%     Weight:      Height:       Physical Exam General:chronically ill appearing, elderly female in NAD Heart:RRR Lungs:CTAB, nml WOB on  Abdomen:soft, NTND Extremities:trace LE edema Dialysis Access: LU AVF +b/t   Filed Weights   03/03/23 0656 03/07/23 0927 03/07/23 1321  Weight: 76 kg 77.8 kg 75.8 kg   No intake or output data in the 24 hours ending 03/09/23 1214   Additional Objective Labs: Basic Metabolic Panel: Recent Labs  Lab 03/05/23 0424 03/07/23 0439 03/09/23 0710  NA 136 133* 134*  K 4.2 5.1 5.1  CL 97* 95* 94*  CO2 27 24 24   GLUCOSE 185* 164* 124*  BUN 30* 70* 57*  CREATININE 4.59* 8.19* 6.44*  CALCIUM  8.6* 9.0 9.2   Liver Function Tests: No results for input(s): "AST", "ALT", "ALKPHOS", "BILITOT", "PROT", "ALBUMIN " in the last 168 hours.  CBC: Recent Labs  Lab 03/04/23 0611 03/05/23 0424 03/06/23 0450 03/07/23 0439  WBC 14.2* 13.4* 13.3* 14.8*  NEUTROABS  --   --   --  12.1*  HGB 9.7* 8.6* 9.7* 9.0*  HCT 30.3* 26.7* 30.8* 27.8*  MCV 99.0 98.5 99.4 98.9  PLT 174 161 189 204   Blood Culture    Component Value Date/Time   SDES BLOOD LEFT HAND 05/02/2021 0201   SPECREQUEST  05/02/2021 0201    BOTTLES DRAWN AEROBIC AND ANAEROBIC Blood Culture results may not be optimal due to an inadequate volume of blood received in culture bottles   CULT  05/02/2021 0201    NO GROWTH 5 DAYS Performed at [facility], [address] [place], [place] [address]    REPTSTATUS 05/07/2021 FINAL 05/02/2021 0201     CBG: Recent Labs  Lab 03/08/23 1623 03/08/23 2035 03/09/23 0613 03/09/23 0825 03/09/23 1114  GLUCAP  267* 203* 138* 123* 115*    Medications:   (feeding supplement) PROSource Plus  30 mL Oral BID BM   acetaminophen   1,000 mg Oral TID   amiodarone   200 mg Oral Daily   apixaban   2.5 mg Oral BID   atorvastatin   80 mg Oral QHS   Chlorhexidine  Gluconate Cloth  6 each Topical Q0600   darbepoetin (ARANESP ) injection - DIALYSIS  60 mcg Subcutaneous Q Fri-1800   insulin  aspart  0-5 Units Subcutaneous QHS   insulin  aspart  0-9 Units Subcutaneous TID WC   insulin  glargine-yfgn  20 Units Subcutaneous Daily   leptospermum manuka honey  1 Application Topical Daily   liver oil-zinc  oxide   Topical BID   melatonin  3 mg Oral QHS   midodrine   5 mg Oral TID WC   multivitamin  1 tablet Oral QHS   pantoprazole   40 mg Oral BID   senna-docusate  1 tablet Oral QHS    Dialysis Orders: [facility] MWF  4h   350/600   77.3kg   2/2 bath   AVF LUA  Heparin  none - hectorol  9 mcg  - last Hb 12.0   Assessment/Plan: Right hip fracture: fall at home, unipolar  hemiarthroplasty of right hip on 1/9, slow recovery ESRD: MWF HD.  Next HD today.  HTN/volume:  Hypotensive. Gets midodrine  pre-hd. Under dry weight,  possible weight loss. Does not appear grossly. 1L UF next HD.  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  Secondary HPTH: Last Ca and phos good. Not on binder. Hectorol  9 mcg Nutrition: Albumin  3.2. Carb modified diet. Continue supplements.  Dementia: chronic, stable, baseline DM: continue with lantus  PAF: continue with amio. Eliquis  on hold Combined diastolic/systolic CHF: EF at 55-60%, still G3DD Prolonged bleeding of fistula: fistulagram 03/03/22. No evidence of stenotic segments, inflow and centrals widely patient, cephalic vein fistula is orthogonal to rest of cephalic vein, need to map out cephalic vein to prevent cannulating close to orthogonal point. AVF used successfully on 1/13.  Dispo: For SNF placement   Charletta Cons, MD  [facility] 03/09/2023,12:14 PM

## 2023-03-10 DIAGNOSIS — Z7189 Other specified counseling: Secondary | ICD-10-CM

## 2023-03-10 DIAGNOSIS — I959 Hypotension, unspecified: Secondary | ICD-10-CM | POA: Diagnosis not present

## 2023-03-10 DIAGNOSIS — S72001A Fracture of unspecified part of neck of right femur, initial encounter for closed fracture: Secondary | ICD-10-CM | POA: Diagnosis not present

## 2023-03-10 DIAGNOSIS — Z515 Encounter for palliative care: Secondary | ICD-10-CM

## 2023-03-10 DIAGNOSIS — N186 End stage renal disease: Secondary | ICD-10-CM | POA: Diagnosis not present

## 2023-03-10 LAB — COMPREHENSIVE METABOLIC PANEL
ALT: 6 U/L (ref 0–44)
AST: 25 U/L (ref 15–41)
Albumin: 2.5 g/dL — ABNORMAL LOW (ref 3.5–5.0)
Alkaline Phosphatase: 103 U/L (ref 38–126)
Anion gap: 14 (ref 5–15)
BUN: 71 mg/dL — ABNORMAL HIGH (ref 8–23)
CO2: 24 mmol/L (ref 22–32)
Calcium: 8.8 mg/dL — ABNORMAL LOW (ref 8.9–10.3)
Chloride: 95 mmol/L — ABNORMAL LOW (ref 98–111)
Creatinine, Ser: 7.54 mg/dL — ABNORMAL HIGH (ref 0.44–1.00)
GFR, Estimated: 5 mL/min — ABNORMAL LOW (ref >60)
Glucose, Bld: 158 mg/dL — ABNORMAL HIGH (ref 70–99)
Potassium: 4.8 mmol/L (ref 3.5–5.1)
Sodium: 133 mmol/L — ABNORMAL LOW (ref 135–145)
Total Bilirubin: 0.7 mg/dL (ref 0.0–1.2)
Total Protein: 6 g/dL — ABNORMAL LOW (ref 6.5–8.1)

## 2023-03-10 LAB — CBC
HCT: 30.6 % — ABNORMAL LOW (ref 36.0–46.0)
Hemoglobin: 9.7 g/dL — ABNORMAL LOW (ref 12.0–15.0)
MCH: 31.9 pg (ref 26.0–34.0)
MCHC: 31.7 g/dL (ref 30.0–36.0)
MCV: 100.7 fL — ABNORMAL HIGH (ref 80.0–100.0)
Platelets: 316 10*3/uL (ref 150–400)
RBC: 3.04 MIL/uL — ABNORMAL LOW (ref 3.87–5.11)
RDW: 15 % (ref 11.5–15.5)
WBC: 17.6 10*3/uL — ABNORMAL HIGH (ref 4.0–10.5)
nRBC: 1.5 % — ABNORMAL HIGH (ref 0.0–0.2)

## 2023-03-10 LAB — GLUCOSE, CAPILLARY
Glucose-Capillary: 118 mg/dL — ABNORMAL HIGH (ref 70–99)
Glucose-Capillary: 101 mg/dL — ABNORMAL HIGH (ref 70–99)
Glucose-Capillary: 112 mg/dL — ABNORMAL HIGH (ref 70–99)
Glucose-Capillary: 170 mg/dL — ABNORMAL HIGH (ref 70–99)
Glucose-Capillary: 191 mg/dL — ABNORMAL HIGH (ref 70–99)
Glucose-Capillary: 212 mg/dL — ABNORMAL HIGH (ref 70–99)

## 2023-03-10 LAB — MAGNESIUM: Magnesium: 2.2 mg/dL (ref 1.7–2.4)

## 2023-03-10 MED ORDER — HEPARIN SODIUM (PORCINE) 1000 UNIT/ML DIALYSIS: 1000.0000 [IU] | INTRAMUSCULAR | Status: DC | PRN

## 2023-03-10 MED ORDER — AMIODARONE IV BOLUS ONLY 150 MG/100ML
INTRAVENOUS | Status: AC
  Filled 2023-03-10: qty 100

## 2023-03-10 MED ORDER — LIDOCAINE 5 % EX PTCH
1.0000 | MEDICATED_PATCH | Freq: Every day | CUTANEOUS | Status: DC
  Administered 2023-03-10: 1 via TRANSDERMAL
  Filled 2023-03-10 (×2): qty 1

## 2023-03-10 MED ORDER — ACETAMINOPHEN 325 MG PO TABS: 650.0000 mg | ORAL_TABLET | Freq: Four times a day (QID) | ORAL | Status: DC | PRN

## 2023-03-10 MED ORDER — VITAMIN D 25 MCG (1000 UNIT) PO TABS
1000.0000 [IU] | ORAL_TABLET | Freq: Every day | ORAL | Status: DC
  Administered 2023-03-10 – 2023-03-11 (×2): 1000 [IU] via ORAL
  Filled 2023-03-10 (×3): qty 1

## 2023-03-10 MED ORDER — LIDOCAINE-PRILOCAINE 2.5-2.5 % EX CREA: 1.0000 | TOPICAL_CREAM | CUTANEOUS | Status: DC | PRN

## 2023-03-10 MED ORDER — APIXABAN 2.5 MG PO TABS
2.5000 mg | ORAL_TABLET | Freq: Two times a day (BID) | ORAL | Status: DC
  Administered 2023-03-10 – 2023-03-11 (×4): 2.5 mg via ORAL
  Filled 2023-03-10 (×5): qty 1

## 2023-03-10 MED ORDER — ONDANSETRON HCL 4 MG/2ML IJ SOLN: 4.0000 mg | Freq: Once | INTRAMUSCULAR | Status: DC

## 2023-03-10 MED ORDER — ACETAMINOPHEN 325 MG PO TABS
650.0000 mg | ORAL_TABLET | Freq: Four times a day (QID) | ORAL | Status: DC | PRN
  Administered 2023-03-10 – 2023-03-11 (×2): 650 mg via ORAL
  Filled 2023-03-10 (×2): qty 2

## 2023-03-10 MED ORDER — PENTAFLUOROPROP-TETRAFLUOROETH EX AERO
1.0000 | INHALATION_SPRAY | CUTANEOUS | Status: DC | PRN
Start: 2023-03-10 — End: 2023-03-14

## 2023-03-10 MED ORDER — DIPHENHYDRAMINE HCL 50 MG/ML IJ SOLN
12.5000 mg | Freq: Once | INTRAMUSCULAR | Status: AC
  Administered 2023-03-10: 12.5 mg via INTRAVENOUS
  Filled 2023-03-10: qty 1

## 2023-03-10 MED ORDER — ANTICOAGULANT SODIUM CITRATE 4% (200MG/5ML) IV SOLN: 5.0000 mL | Status: DC | PRN

## 2023-03-10 MED ORDER — AMIODARONE IV BOLUS ONLY 150 MG/100ML: 150.0000 mg | Freq: Once | INTRAVENOUS | Status: DC

## 2023-03-10 MED ORDER — ALTEPLASE 2 MG IJ SOLR: 2.0000 mg | Freq: Once | INTRAMUSCULAR | Status: DC | PRN

## 2023-03-10 MED ORDER — LIDOCAINE HCL (PF) 1 % IJ SOLN: 5.0000 mL | INTRAMUSCULAR | Status: DC | PRN

## 2023-03-10 NOTE — Plan of Care (Signed)
  Problem: Coping: Goal: Ability to adjust to condition or change in health will improve Outcome: Progressing   Problem: Skin Integrity: Goal: Risk for impaired skin integrity will decrease Outcome: Progressing   Problem: Coping: Goal: Level of anxiety will decrease Outcome: Progressing

## 2023-03-10 NOTE — Progress Notes (Addendum)
Patient unresponsive, eyes focused straight ahead. Patient still has pulse, Perfomed sternal rub. Unit nurse and DO came to bedside to assist. UF turned off. Systolic BP dropped to 70s. Treatment team remained at bedside performing interventions.  Patient became responsive, alert, oriented to self. UF remained off.

## 2023-03-10 NOTE — Consult Note (Signed)
Palliative Care Consult Note                                  Date: 03/10/2023   Patient Name: Stephanie Woodward  DOB: 1940/09/05  MRN: [id number]  Age / Sex: 83 y.o., female  PCP: [facility] Referring Physician: Lonia Blood, MD  Reason for Consultation: Establishing goals of care  HPI/Patient Profile: 84 y.o. female  with past medical history of ESRD on HD (MWF), cognitive impairment/dementia, DM 2, chronic A-fib on Eliquis, HTN, HLD, COPD, OSA, and obesity who presented to the ED with complaints of right hip pain after a fall on 03/02/2023.  She was found to have a closed displaced right femoral neck fracture and Ortho was consulted.  Patient underwent a right hip hemiarthroplasty on 1/9 by Dr. Carola Frost.  On 1/15, patient developed developed worsening hypotension on top of chronic hypotension (on midodrine).   Palliative Medicine was consulted for goals of care and medical decision making.  Subjective:   Extensive chart review has been completed prior to meeting with patient/family including labs, vital signs, imaging, progress/consult notes, orders, medications and available advance directive documents.   Patient did not tolerate dialysis today.  She became unresponsive and hypotensive.  Patient assessed at bedside. Patient is currently sleeping comfortably and I did not attempt to wake her.  I met with daughters Judeth Cornfield and [name] in the 12M waiting room to discuss diagnosis, prognosis, GOC, disposition, and options.  I introduced Palliative Medicine as specialized medical care for people living with serious illness.   Created space and opportunity for family to express thoughts and feelings regarding current medical situation. Values and goals of care were attempted to be elicited.  Life Review: Patient is widowed.  She has 2 daughters, Judeth Cornfield and [name].  She also has 2  grandchildren.  Functional Status: Patient lives at home.  She has home care services with [facility].  At baseline, she is wheelchair-bound but able to transfer out of bed to chair.   GOC Discussion: We discussed patient's current illness and what it means in the larger context of her ongoing co-morbidities.  We reviewed her hospital course and current clinical status.   Daughters very clearly understand that if patient can no longer tolerate dialysis, then she will be at end-of-life.  They are asking good questions about prognosis if dialysis is not continued.  I provided education that patients with ESRD who stop dialysis will die, on average, 7 days later.  We also discussed that even best case scenario if patient is able to tolerate further dialysis in the ICU (on vasopressors), this does not mean she will tolerate dialysis at an outpatient center.  She currently has a lot of pain and would likely not be able to sit in a recliner for several hours.  The difference between full scope medical intervention and comfort care was considered.  We discussed that comfort care involves de-escalating full scope medical interventions and allowing a natural course to occur.  Discussed that the goal is comfort and dignity rather than prolonging life.   Discussed that if dialysis cannot be continued, then hospice would be appropriate to ensure patient dies comfortably and with dignity.  Daughters shared that they are familiar with hospice services through experience when their father died.  We reviewed home versus inpatient hospice services.   Daughters are understandably emotional.  They were not expecting her to decline so rapidly, and are still processing the situation.  Plan for additional GOC discussion with patient and daughters tomorrow.  Emotional support provided.  Questions/concerns were addressed.  Discussed with Dr. Ned Card and patient's RN.    Review of Systems  Unable to perform  ROS   Objective:   Primary Diagnoses: Present on Admission:  Closed displaced fracture of right femoral neck (HCC)  Chronic combined systolic and diastolic heart failure (HCC)  Paroxysmal atrial fibrillation (HCC)  Hypotension  Type 2 diabetes mellitus, controlled, with renal complications (HCC)  Dementia without behavioral disturbance (HCC)   Physical Exam Vitals reviewed.  Constitutional:      General: She is sleeping.     Appearance: She is ill-appearing.     Comments: Appears frail  Pulmonary:     Effort: Pulmonary effort is normal.     Palliative Assessment/Data: PPS 30%     Assessment & Plan:   SUMMARY OF RECOMMENDATIONS   Continue current supportive interventions Plan for additional GOC discussion with patient and daughters tomorrow  Primary Decision Maker: PATIENT - with support from her 2 daughters  Code Status/Advance Care Planning: DNR - Limited  Symptom Management:  Per attending  Prognosis:  < 2 weeks in the setting of not tolerating dialysis  Discharge Planning:  To Be Determined    Thank you for allowing [place] to participate in the care of Duffy Rhody   Time Total: 90 minutes  Detailed review of medical records (labs, imaging, vital signs), medically appropriate exam, discussed with treatment team, counseling and education to patient, family, & staff, documenting clinical information, coordination of care.   Signed by: Sherlean Foot, NP Palliative Medicine Team  Team Phone # [phone]  For individual providers, please see AMION

## 2023-03-10 NOTE — Progress Notes (Signed)
[facility] Progress Note   Subjective:    Move to ICU for hypotension on low dose NE About to start iHD Daughter at bedside who I have known from before, discussed big picture, poor prognosis, and explored GOC  Objective Vitals:   03/10/23 0815 03/10/23 0830 03/10/23 0845 03/10/23 0900  BP: 124/82 (!) 134/102 (!) 135/56 (!) 123/54  Pulse: 90 91 84 74  Resp: (!) 9 (!) 22 (!) 8 15  Temp:      TempSrc:      SpO2: 99% 99% 98% 99%  Weight:      Height:       Physical Exam General:chronically ill appearing, elderly female in NAD Heart:RRR Lungs:CTAB, nml WOB on  Abdomen:soft, NTND Extremities:trace LE edema Dialysis Access: LU AVF +b/t   Filed Weights   03/03/23 0656 03/07/23 0927 03/07/23 1321  Weight: 76 kg 77.8 kg 75.8 kg    Intake/Output Summary (Last 24 hours) at 03/10/2023 0951 Last data filed at 03/10/2023 0800 Gross per 24 hour  Intake 420.32 ml  Output --  Net 420.32 ml     Additional Objective Labs: Basic Metabolic Panel: Recent Labs  Lab 03/07/23 0439 03/09/23 0710 03/10/23 0304  NA 133* 134* 133*  K 5.1 5.1 4.8  CL 95* 94* 95*  CO2 24 24 24   GLUCOSE 164* 124* 158*  BUN 70* 57* 71*  CREATININE 8.19* 6.44* 7.54*  CALCIUM 9.0 9.2 8.8*   Liver Function Tests: Recent Labs  Lab 03/10/23 0304  AST 25  ALT 6  ALKPHOS 103  BILITOT 0.7  PROT 6.0*  ALBUMIN 2.5*    CBC: Recent Labs  Lab 03/05/23 0424 03/06/23 0450 03/07/23 0439 03/09/23 1620 03/09/23 2015 03/10/23 0304  WBC 13.4* 13.3* 14.8* 13.8*  --  17.6*  NEUTROABS  --   --  12.1*  --   --   --   HGB 8.6* 9.7* 9.0* 9.2* 10.3* 9.7*  HCT 26.7* 30.8* 27.8* 28.7* 32.5* 30.6*  MCV 98.5 99.4 98.9 100.0  --  100.7*  PLT 161 189 204 274  --  316   Blood Culture    Component Value Date/Time   SDES BLOOD RIGHT HAND 03/09/2023 2018   SPECREQUEST  03/09/2023 2018    BOTTLES DRAWN AEROBIC AND ANAEROBIC Blood Culture results may not be optimal due to an inadequate volume of  blood received in culture bottles   CULT  03/09/2023 2018    NO GROWTH < 12 HOURS Performed at [facility], [address] [place], [place] [address]    REPTSTATUS PENDING 03/09/2023 2018     CBG: Recent Labs  Lab 03/09/23 1114 03/09/23 1656 03/09/23 1727 03/09/23 2146 03/10/23 0742  GLUCAP 115* 219* 210* 192* 118*    Medications:  sodium chloride 20 mL/hr at 03/10/23 0800   anticoagulant sodium citrate     norepinephrine (LEVOPHED) Adult infusion 3 mcg/min (03/10/23 0800)    (feeding supplement) PROSource Plus  30 mL Oral BID BM   amiodarone  200 mg Oral Daily   apixaban  2.5 mg Oral BID   Chlorhexidine Gluconate Cloth  6 each Topical Q0600   vitamin D3  1,000 Units Oral Daily   darbepoetin (ARANESP) injection - DIALYSIS  60 mcg Subcutaneous Q Fri-1800   insulin aspart  0-5 Units Subcutaneous QHS   insulin aspart  0-9 Units Subcutaneous TID WC   insulin glargine-yfgn  20 Units Subcutaneous Daily   leptospermum manuka honey  1 Application Topical Daily  lidocaine  1 Application Topical Q M,W,F   liver oil-zinc oxide   Topical BID   melatonin  3 mg Oral QHS   midodrine  10 mg Oral TID WC   multivitamin  1 tablet Oral QHS   mouth rinse  15 mL Mouth Rinse 4 times per day   pantoprazole  40 mg Oral BID   senna-docusate  1 tablet Oral QHS    Dialysis Orders: [facility] MWF  4h   350/600   77.3kg   2/2 bath   AVF LUA  Heparin none - hectorol 9 mcg  - last Hb 12.0   Assessment/Plan: Right hip fracture: fall at home, unipolar hemiarthroplasty of right hip on 1/9, slow recovery at most, now with decline in ICU ESRD: MWF HD.  Next HD today max UF 1L  HTN/volume:  Hypotensive. Worsened now on NE.  Gets midodrine pre-hd.  Anemia of ESRD: Hgb 9.s-10 Aranesp 60 mcg given 03/04/23.  Secondary HPTH: Last Ca and phos good. Not on binder. Hectorol 9 mcg Nutrition: Albumin 2.5.  Carb modified diet. Continue supplements.  Dementia: chronic, stable, baseline DM: continue with  lantus PAF: continue with amio. Eliquis on hold Combined diastolic/systolic CHF: EF at 55-60%, still G3DD Prolonged bleeding of fistula: fistulagram 03/03/22. No evidence of stenotic segments, inflow and centrals widely patient, cephalic vein fistula is orthogonal to rest of cephalic vein, need to map out cephalic vein to prevent cannulating close to orthogonal point. AVF used successfully on 1/13.  Dispo: now not improving after a week from surgery.  Discussed GOC with family and will involve palliative  Arita Miss, MD  [facility] 03/10/2023,9:51 AM

## 2023-03-10 NOTE — TOC Progression Note (Addendum)
Transition of Care [facility]) - Progression Note    Patient Details  Name: Stephanie Woodward MRN: [id number] Date of Birth: Oct 21, 1940  [facility] CM/SW Contact  Marliss Coots, LCSW Phone Number: 03/10/2023, 8:49 AM  Clinical Narrative:     8:49 AM Per hospitalist, patient will not discharge to [facility] SNF today via ambulance transportation due to ICU admission. TOC will continue to follow.  12:22 PM Per progressions, patient did not tolerate hemodialysis this morning and is expected to discharge to [facility] in a week. CSW will continue to follow.  Expected Discharge Plan: Skilled Nursing Facility Barriers to Discharge: Continued Medical Work up  Expected Discharge Plan and Services In-house Referral: Clinical Social Work   Post Acute Care Choice: Skilled Nursing Facility Living arrangements for the past 2 months: Apartment                                       Social Determinants of Health (SDOH) Interventions SDOH Screenings   Food Insecurity: No Food Insecurity (03/02/2023)  Housing: Low Risk  (03/02/2023)  Transportation Needs: No Transportation Needs (03/02/2023)  Utilities: Not At Risk (03/02/2023)  Depression (PHQ2-9): Low Risk  (12/03/2019)  Financial Resource Strain: Low Risk  (02/27/2021)  Social Connections: Moderately Isolated (03/02/2023)  Tobacco Use: Medium Risk (03/03/2023)    Readmission Risk Interventions     No data to display

## 2023-03-10 NOTE — Plan of Care (Signed)
  Problem: Fluid Volume: Goal: Ability to maintain a balanced intake and output will improve Outcome: Progressing   Problem: Health Behavior/Discharge Planning: Goal: Ability to manage health-related needs will improve Outcome: Progressing   Problem: Metabolic: Goal: Ability to maintain appropriate glucose levels will improve Outcome: Progressing   Problem: Nutritional: Goal: Maintenance of adequate nutrition will improve Outcome: Progressing   Problem: Skin Integrity: Goal: Risk for impaired skin integrity will decrease Outcome: Progressing   Problem: Tissue Perfusion: Goal: Adequacy of tissue perfusion will improve Outcome: Progressing   Problem: Clinical Measurements: Goal: Ability to maintain clinical measurements within normal limits will improve Outcome: Progressing Goal: Will remain free from infection Outcome: Progressing

## 2023-03-10 NOTE — Progress Notes (Signed)
NAME:  Stephanie Woodward, MRN:  [id number], DOB:  [birth year], LOS: 8 ADMISSION DATE:  03/02/2023, CONSULTATION DATE:  1/15 REFERRING MD:  [name] Seller CHIEF COMPLAINT:  hip pain   History of Present Illness:  Pt is an 83 yr old female with significant pmhx of cognitive impairment/dementia, ESRD (MWF), DM2, HTN, HLD, CHF, and chronic afib on eliquis, COPD, OSA, and obesity who presented to ED with complaints of right hip pain after a fall on 1/8. Per ED work up, a closed displaced right femoral neck fracture was found and Ortho was consulted. Patient underwent a right hip hemiarthroplasty on 1/9 by Dr. Carola Frost. On 1/15 patient progressive had worsening hypotension on top of chronic hypotension (receiving midodrine) and was given 500 cc fluid bolus challenge but hypotension remaining refractory. Due to ongoing hypotension and increase in tachycardia compared to baseline, PCCM was consulted to assist in management for hypotension.    Upon assessment at beside, patient was alert and oriented x 4 and able to answer questions appropriate despite hx of cognitive impairment. Discussed with patient the possible need of vasopressor support due to ongoing hypotension. Also, had GOC with patient and daughters at beside. Discussed extensively with patient and daughters against CPR, defibrillation, and mechanical ventilation in the event of cardiopulmonary arrest. We would continue full care but in the event of cardiac and or respiratory arrest we would not perform interventions just mentioned. Patient expressed she would not want to suffer if did have a cardiac or respiratory arrest and would want to be made DNR/DNI. This was witnessed by daughters at beside, along with PCCM MD H[place]ucker.   Pertinent  Medical History  Dementia ESRD on HD MWF T2DM HTN HLD CHF Chronic afib on eliquis COPD/OSA  Significant Hospital Events: Including procedures, antibiotic start and stop dates in addition to other pertinent events   1/8  Mechanical fall, admit with right hip femoral neck fracture 1/9 Hemiarthroplasty of right hip fracture  1/15 PCCM consult due to ongoing hypotension; started on low dose peripheral levophed  Interim History / Subjective:  Seen with daughter at bedside.   Objective   Blood pressure (!) 131/48, pulse 80, temperature 98.1 F (36.7 C), temperature source Oral, resp. rate 12, height 5\' 2"  (1.575 m), weight 75.8 kg, SpO2 100%.        Intake/Output Summary (Last 24 hours) at 03/10/2023 0654 Last data filed at 03/10/2023 0500 Gross per 24 hour  Intake 326.64 ml  Output --  Net 326.64 ml   Filed Weights   03/03/23 0656 03/07/23 0927 03/07/23 1321  Weight: 76 kg 77.8 kg 75.8 kg    Examination: General: chronically ill appearing, elderly female  Lungs: breathing comfortably on room air; clear breath sounds bilaterally, but diminished Cardiovascular: regular rate, irregular rhythm  Abdomen: soft, nontender Extremities: LUE AVF with palpable thrill Neuro: alert and oriented x4, follows all commands  Hb 9.7 WBC 13.8 > 17.6 Na 133 Cl 95  Resolved Hospital Problem list     Assessment & Plan:  Hypotension likely 2/2 hypovolemia - s/p LR bolus - midodrine 10 mg TID - SBP goal 90 - Discontinue levophed after HD - blood cultures with no growth <12 hrs (low suspicion for shock/sepsis) - held off on abx now   ESRD on HD MWF - nephro consulted - HD today (off usual schedule)  Displaced right femoral neck fracture s/p hemiarthroplasty on 1/9 - post op care per ortho - PT/OT  Acute on chronic anemia of chronic disease - Hb stable -  aranesp q friday  T2DM - semglee 20u daily + ssi  Chronic afib - amiodarone 200 mg daily  Best Practice (right click and "Reselect all SmartList Selections" daily)   Diet/type: NPO, SLP consulted DVT prophylaxis SCD Pressure ulcer(s): identified on: 15/Jan GI prophylaxis: PPI Lines: N/A Foley:  N/A Code Status:  DNR Last date of  multidisciplinary goals of care discussion [updated daughter at the bedside 1/16]  Labs   CBC: Recent Labs  Lab 03/05/23 0424 03/06/23 0450 03/07/23 0439 03/09/23 1620 03/09/23 2015 03/10/23 0304  WBC 13.4* 13.3* 14.8* 13.8*  --  17.6*  NEUTROABS  --   --  12.1*  --   --   --   HGB 8.6* 9.7* 9.0* 9.2* 10.3* 9.7*  HCT 26.7* 30.8* 27.8* 28.7* 32.5* 30.6*  MCV 98.5 99.4 98.9 100.0  --  100.7*  PLT 161 189 204 274  --  316    Basic Metabolic Panel: Recent Labs  Lab 03/04/23 0611 03/05/23 0424 03/07/23 0439 03/09/23 0710 03/10/23 0304  NA 135 136 133* 134* 133*  K 4.4 4.2 5.1 5.1 4.8  CL 96* 97* 95* 94* 95*  CO2 26 27 24 24 24   GLUCOSE 211* 185* 164* 124* 158*  BUN 38* 30* 70* 57* 71*  CREATININE 6.75* 4.59* 8.19* 6.44* 7.54*  CALCIUM 9.1 8.6* 9.0 9.2 8.8*  MG  --   --   --   --  2.2   GFR: Estimated Creatinine Clearance: 5.7 mL/min (A) (by C-G formula based on SCr of 7.54 mg/dL (H)). Recent Labs  Lab 03/06/23 0450 03/07/23 0439 03/09/23 1620 03/10/23 0304  WBC 13.3* 14.8* 13.8* 17.6*    Liver Function Tests: Recent Labs  Lab 03/10/23 0304  AST 25  ALT 6  ALKPHOS 103  BILITOT 0.7  PROT 6.0*  ALBUMIN 2.5*   No results for input(s): "LIPASE", "AMYLASE" in the last 168 hours. No results for input(s): "AMMONIA" in the last 168 hours.  ABG    Component Value Date/Time   PHART 7.343 (L) 02/22/2021 0830   PCO2ART 43.9 02/22/2021 0830   PO2ART 65 (L) 02/22/2021 0830   HCO3 24.0 02/22/2021 0830   TCO2 31 03/03/2023 0740   ACIDBASEDEF 2.0 02/22/2021 0830   O2SAT 92.0 02/22/2021 0830     Coagulation Profile: No results for input(s): "INR", "PROTIME" in the last 168 hours.  Cardiac Enzymes: No results for input(s): "CKTOTAL", "CKMB", "CKMBINDEX", "TROPONINI" in the last 168 hours.  HbA1C: Hgb A1c MFr Bld  Date/Time Value Ref Range Status  03/02/2023 06:34 AM 6.8 (H) 4.8 - 5.6 % Final    Comment:    (NOTE)         Prediabetes: 5.7 - 6.4          Diabetes: >6.4         Glycemic control for adults with diabetes: <7.0   05/19/2022 09:50 PM 7.9 (H) 4.8 - 5.6 % Final    Comment:    (NOTE)         Prediabetes: 5.7 - 6.4         Diabetes: >6.4         Glycemic control for adults with diabetes: <7.0     CBG: Recent Labs  Lab 03/09/23 0825 03/09/23 1114 03/09/23 1656 03/09/23 1727 03/09/23 2146  GLUCAP 123* 115* 219* 210* 192*    Review of Systems:   pain  Past Medical History:  She,  has a past medical history of Arthritis, Asthma, Atrial fibrillation (  HCC), CHF (congestive heart failure) (HCC), CKD (chronic kidney disease), stage III (HCC), COPD (chronic obstructive pulmonary disease) (HCC), Diabetes mellitus, Dialysis patient (HCC), Gout, Heart murmur, History of kidney stones, Hyperlipemia, Hypertension, Psoriasis, Renal disorder, Single kidney, and Sleep apnea.   Surgical History:   Past Surgical History:  Procedure Laterality Date   A/V FISTULAGRAM Left 10/31/2020   Procedure: A/V FISTULAGRAM;  Surgeon: Chuck Hint, MD;  Location: [facility] INVASIVE CV LAB;  Service: Cardiovascular;  Laterality: Left;   A/V FISTULAGRAM N/A 03/04/2023   Procedure: A/V Fistulagram;  Surgeon: Ethelene Hal, MD;  Location: [facility] INVASIVE CV LAB;  Service: Cardiovascular;  Laterality: N/A;   ABDOMINAL HYSTERECTOMY     AV FISTULA PLACEMENT Left 11/17/2018   Procedure: BRACHIO-CEPHALIC ARTERIOVENOUS (AV) FISTULA CREATION IN LEFT ARM;  Surgeon: Cephus Shelling, MD;  Location: [facility] OR;  Service: Vascular;  Laterality: Left;   CARDIOVERSION N/A 03/03/2021   Procedure: CARDIOVERSION;  Surgeon: Laurey Morale, MD;  Location: [facility] ENDOSCOPY;  Service: Cardiovascular;  Laterality: N/A;   CARDIOVERSION N/A 03/12/2021   Procedure: CARDIOVERSION;  Surgeon: Laurey Morale, MD;  Location: [facility] ENDOSCOPY;  Service: Cardiovascular;  Laterality: N/A;   CHOLECYSTECTOMY     HIP ARTHROPLASTY Right 03/03/2023   Procedure: ARTHROPLASTY BIPOLAR HIP  (HEMIARTHROPLASTY) POSTERIOR;  Surgeon: Myrene Galas, MD;  Location: [facility] OR;  Service: Orthopedics;  Laterality: Right;   INSERTION OF DIALYSIS CATHETER Right 01/26/2019   Procedure: INSERTION OF DIALYSIS CATHETER, right internal jugular;  Surgeon: Chuck Hint, MD;  Location: [facility] OR;  Service: Vascular;  Laterality: Right;   LEFT AND RIGHT HEART CATHETERIZATION WITH CORONARY ANGIOGRAM N/A 11/29/2013   Procedure: LEFT AND RIGHT HEART CATHETERIZATION WITH CORONARY ANGIOGRAM;  Surgeon: Othella Boyer, MD;  Location: [facility] CATH LAB;  Service: Cardiovascular;  Laterality: N/A;   LEFT HEART CATH AND CORONARY ANGIOGRAPHY N/A 02/24/2021   Procedure: LEFT HEART CATH AND CORONARY ANGIOGRAPHY;  Surgeon: Laurey Morale, MD;  Location: [facility] INVASIVE CV LAB;  Service: Cardiovascular;  Laterality: N/A;   PERIPHERAL VASCULAR INTERVENTION Left 10/31/2020   Procedure: PERIPHERAL VASCULAR INTERVENTION;  Surgeon: Chuck Hint, MD;  Location: [facility] INVASIVE CV LAB;  Service: Cardiovascular;  Laterality: Left;   RIGHT HEART CATH N/A 11/23/2017   Procedure: RIGHT HEART CATH;  Surgeon: Laurey Morale, MD;  Location: [facility] INVASIVE CV LAB;  Service: Cardiovascular;  Laterality: N/A;   RIGHT/LEFT HEART CATH AND CORONARY ANGIOGRAPHY N/A 06/15/2019   Procedure: RIGHT/LEFT HEART CATH AND CORONARY ANGIOGRAPHY;  Surgeon: Laurey Morale, MD;  Location: [facility] INVASIVE CV LAB;  Service: Cardiovascular;  Laterality: N/A;     Social History:   reports that she quit smoking about 14 years ago. Her smoking use included cigarettes. She started smoking about 34 years ago. She has a 20 pack-year smoking history. She has never used smokeless tobacco. She reports current alcohol use. She reports that she does not currently use drugs.   Family History:  Her family history includes CVA (age of onset: 77) in her father; Cancer (age of onset: 74) in her brother; Cancer (age of onset: 79) in her mother; Lupus in her daughter.    Allergies Allergies  Allergen Reactions   Egg-Derived Products Nausea And Vomiting   Lisinopril Nausea And Vomiting   Penicillins Nausea And Vomiting    Has patient had a PCN reaction causing immediate rash, facial/tongue/throat swelling, SOB or lightheadedness with hypotension: No Has patient had a PCN reaction causing severe rash involving mucus membranes or  skin necrosis: No Has patient had a PCN reaction that required hospitalization: No Has patient had a PCN reaction occurring within the last 10 years: No If all of the above answers are "NO", then may proceed with Cephalosporin use.    Tramadol     Hallucinations, mental status changes     Home Medications  Prior to Admission medications   Medication Sig Start Date End Date Taking? Authorizing Provider  acetaminophen (TYLENOL) 500 MG tablet Take 1,000 mg by mouth in the morning and at bedtime.   Yes [provider]  amiodarone (PACERONE) 200 MG tablet Take 200 mg by mouth daily.   Yes [provider]  atorvastatin (LIPITOR) 80 MG tablet Take 80 mg by mouth at bedtime.   Yes [provider]  chlorhexidine (HIBICLENS) 4 % external liquid Apply 15 mLs (1 Application total) topically as directed for 30 doses. Use as directed daily for 5 days every other week for 6 weeks. 03/03/23  Yes Montez Morita, PA-C  Cholecalciferol (VITAMIN D3) 25 MCG (1000 UT) tablet Take 1,000 Units by mouth daily.   Yes [provider]  ELIQUIS 5 MG TABS tablet Take 1 tablet by mouth twice daily Patient taking differently: Take 2.5 mg by mouth 2 (two) times daily. 07/23/20  Yes Laurey Morale, MD  Insulin Glargine (SEMGLEE [place]) Inject 20 Units into the skin in the morning.   Yes [provider]  lidocaine (LINDAMANTLE) 3 % CREA cream Apply 1 application  topically every Monday, Wednesday, and Friday. Prior to dialysis 03/06/20  Yes [provider]  midodrine (PROAMATINE) 10 MG tablet Take 10 mg by mouth 3  (three) times a week. Taken before dialysis on Monday, Wednesday, Friday Hold if SBP>130   Yes [provider]  mupirocin ointment (BACTROBAN) 2 % Place 1 Application into the nose 2 (two) times daily for 60 doses. Use as directed 2 times daily for 5 days every other week for 6 weeks. 03/03/23 04/02/23 Yes Montez Morita, PA-C  pantoprazole (PROTONIX) 40 MG tablet Take 1 tablet (40 mg total) by mouth 2 (two) times daily. 05/20/22  Yes Tyrone Nine, MD  Pramoxine-Calamine (AVEENO ANTI-ITCH EX) Apply 1 application  topically every 6 (six) hours as needed (itching).   Yes [provider]  nitroGLYCERIN (NITROSTAT) 0.4 MG SL tablet Place 0.4 mg under the tongue every 5 (five) minutes as needed for chest pain. Patient not taking: Reported on 03/02/2023    [provider]     Critical care time: 25 min

## 2023-03-10 NOTE — Evaluation (Signed)
Clinical/Bedside Swallow Evaluation Patient Details  Name: Stephanie Woodward MRN: [id number] Date of Birth: 1940-04-02  Today's Date: 03/10/2023 Time: SLP Start Time (ACUTE ONLY): 1416 SLP Stop Time (ACUTE ONLY): 1426 SLP Time Calculation (min) (ACUTE ONLY): 10 min  Past Medical History:  Past Medical History:  Diagnosis Date   Arthritis    Asthma    Atrial fibrillation (HCC)    CHF (congestive heart failure) (HCC)    CKD (chronic kidney disease), stage III (HCC)    COPD (chronic obstructive pulmonary disease) (HCC)    Diabetes mellitus    INSULIN DEPENDENT   Dialysis patient (HCC)    Gout    Heart murmur    no issues per pt   History of kidney stones    Hyperlipemia    Hypertension    Psoriasis    Renal disorder    congenital   Single kidney    Sleep apnea    doesn't use the Cpap   Past Surgical History:  Past Surgical History:  Procedure Laterality Date   A/V FISTULAGRAM Left 10/31/2020   Procedure: A/V FISTULAGRAM;  Surgeon: Chuck Hint, MD;  Location: [facility] INVASIVE CV LAB;  Service: Cardiovascular;  Laterality: Left;   A/V FISTULAGRAM N/A 03/04/2023   Procedure: A/V Fistulagram;  Surgeon: Ethelene Hal, MD;  Location: [facility] INVASIVE CV LAB;  Service: Cardiovascular;  Laterality: N/A;   ABDOMINAL HYSTERECTOMY     AV FISTULA PLACEMENT Left 11/17/2018   Procedure: BRACHIO-CEPHALIC ARTERIOVENOUS (AV) FISTULA CREATION IN LEFT ARM;  Surgeon: Cephus Shelling, MD;  Location: [facility] OR;  Service: Vascular;  Laterality: Left;   CARDIOVERSION N/A 03/03/2021   Procedure: CARDIOVERSION;  Surgeon: Laurey Morale, MD;  Location: [facility] ENDOSCOPY;  Service: Cardiovascular;  Laterality: N/A;   CARDIOVERSION N/A 03/12/2021   Procedure: CARDIOVERSION;  Surgeon: Laurey Morale, MD;  Location: [facility] ENDOSCOPY;  Service: Cardiovascular;  Laterality: N/A;   CHOLECYSTECTOMY     HIP ARTHROPLASTY Right 03/03/2023   Procedure: ARTHROPLASTY BIPOLAR HIP (HEMIARTHROPLASTY) POSTERIOR;  Surgeon: Myrene Galas, MD;  Location: [facility] OR;  Service: Orthopedics;  Laterality: Right;   INSERTION OF DIALYSIS CATHETER Right 01/26/2019   Procedure: INSERTION OF DIALYSIS CATHETER, right internal jugular;  Surgeon: Chuck Hint, MD;  Location: [facility] OR;  Service: Vascular;  Laterality: Right;   LEFT AND RIGHT HEART CATHETERIZATION WITH CORONARY ANGIOGRAM N/A 11/29/2013   Procedure: LEFT AND RIGHT HEART CATHETERIZATION WITH CORONARY ANGIOGRAM;  Surgeon: Othella Boyer, MD;  Location: [facility] CATH LAB;  Service: Cardiovascular;  Laterality: N/A;   LEFT HEART CATH AND CORONARY ANGIOGRAPHY N/A 02/24/2021   Procedure: LEFT HEART CATH AND CORONARY ANGIOGRAPHY;  Surgeon: Laurey Morale, MD;  Location: [facility] INVASIVE CV LAB;  Service: Cardiovascular;  Laterality: N/A;   PERIPHERAL VASCULAR INTERVENTION Left 10/31/2020   Procedure: PERIPHERAL VASCULAR INTERVENTION;  Surgeon: Chuck Hint, MD;  Location: [facility] INVASIVE CV LAB;  Service: Cardiovascular;  Laterality: Left;   RIGHT HEART CATH N/A 11/23/2017   Procedure: RIGHT HEART CATH;  Surgeon: Laurey Morale, MD;  Location: [facility] INVASIVE CV LAB;  Service: Cardiovascular;  Laterality: N/A;   RIGHT/LEFT HEART CATH AND CORONARY ANGIOGRAPHY N/A 06/15/2019   Procedure: RIGHT/LEFT HEART CATH AND CORONARY ANGIOGRAPHY;  Surgeon: Laurey Morale, MD;  Location: [facility] INVASIVE CV LAB;  Service: Cardiovascular;  Laterality: N/A;   HPI:  Stephanie Woodward is an 83 yo female presenting to ED 1/8 after a mechanical fall. Admitted with closed displaced fx of R femoral neck for  surgical management. Underwent R hip hemiarthroplasty 1/9. Seen by SLP 1/11 without s/s of oropharyngeal dysphagia, but with difficulty self-feeding. SLP provided red foam handles for utensils and s/o. Pt had worsening hypotension and tachycardia 1/15 and was tx to ICU care. PMH includes ESRD on HD, dementia with behavioral disturbance, T2DM, HTN, HLD, CHF, A-fib on Eliquis, CKD, COPD, obesity, OSA    Assessment /  Plan / Recommendation  Clinical Impression  Pt reports no acute concerns with swallowing since previous evaluation 1/11. Pt is edentulous but states she does not typically wear dentures. Observed pt with trials of thin liquids and regular solids without overt s/s of dysphagia or aspiration. Recommend she continue her current diet without further SLP f/u. SLP Visit Diagnosis: Dysphagia, unspecified (R13.10)    Aspiration Risk  Mild aspiration risk    Diet Recommendation Regular;Thin liquid    Liquid Administration via: Cup;Straw Medication Administration: Whole meds with liquid Supervision: Staff to assist with self feeding Compensations: Slow rate;Small sips/bites;Minimize environmental distractions Postural Changes: Seated upright at 90 degrees    Other  Recommendations      Recommendations for follow up therapy are one component of a multi-disciplinary discharge planning process, led by the attending physician.  Recommendations may be updated based on patient status, additional functional criteria and insurance authorization.  Follow up Recommendations No SLP follow up      Assistance Recommended at Discharge    Functional Status Assessment Patient has not had a recent decline in their functional status  Frequency and Duration            Prognosis Prognosis for improved oropharyngeal function: Good Barriers to Reach Goals: Time post onset      Swallow Study   General HPI: Stephanie Woodward is an 83 yo female presenting to ED 1/8 after a mechanical fall. Admitted with closed displaced fx of R femoral neck for surgical management. Underwent R hip hemiarthroplasty 1/9. Seen by SLP 1/11 without s/s of oropharyngeal dysphagia, but with difficulty self-feeding. SLP provided red foam handles for utensils and s/o. Pt had worsening hypotension and tachycardia 1/15 and was tx to ICU care. PMH includes ESRD on HD, dementia with behavioral disturbance, T2DM, HTN, HLD, CHF, A-fib on Eliquis,  CKD, COPD, obesity, OSA Type of Study: Bedside Swallow Evaluation Previous Swallow Assessment: in 2023; BSE, FEES Diet Prior to this Study: Regular;Thin liquids (Level 0) Temperature Spikes Noted: No Respiratory Status: Nasal cannula History of Recent Intubation: No Behavior/Cognition: Alert;Cooperative;Pleasant mood Oral Cavity Assessment: Within Functional Limits Oral Care Completed by SLP: No Oral Cavity - Dentition: Edentulous;Other (Comment) (reports she does not wear dentures) Vision: Functional for self-feeding Self-Feeding Abilities: Able to feed self Patient Positioning: Upright in bed Baseline Vocal Quality: Normal Volitional Cough: Strong Volitional Swallow: Able to elicit    Oral/Motor/Sensory Function Overall Oral Motor/Sensory Function: Within functional limits   Ice Chips Ice chips: Not tested   Thin Liquid Thin Liquid: Within functional limits Presentation: Straw;Self Fed    Nectar Thick Nectar Thick Liquid: Not tested   Honey Thick Honey Thick Liquid: Not tested   Puree Puree: Not tested   Solid     Solid: Within functional limits Presentation: Self Fed      Gwynneth Aliment, M.A., CF-SLP Speech Language Pathology, Acute Rehabilitation Services  Secure Chat preferred [phone]  03/10/2023,3:00 PM

## 2023-03-10 NOTE — Progress Notes (Signed)
SLP Cancellation Note  Patient Details Name: Stephanie Woodward MRN: [id number] DOB: 1940/03/05   Cancelled treatment:       Reason Eval/Treat Not Completed: Medical issues which prohibited therapy. Pt noted to be unresponsive during two HD attempts. Discussed with RN, who reports ongoing n/v. SLP will f/u at a later time.    Gwynneth Aliment, M.A., CF-SLP Speech Language Pathology, Acute Rehabilitation Services  Secure Chat preferred [phone]  03/10/2023, 11:11 AM

## 2023-03-10 NOTE — Progress Notes (Signed)
Stephanie Woodward  YQM:[id number] DOB: 1940/10/06 DOA: 03/02/2023 PCP: [facility]    Brief Narrative:  83 year old with a history of ESRD on HD MWF, dementia, DM2, HTN, HLD, CHF, chronic atrial fibrillation on Eliquis, COPD, obesity, and OSA who was brought to the hospital 1/8 after suffering a simple mechanical fall with complaints of severe right hip pain.  In the ER x-rays noted a closed displaced fracture of the right femoral neck.  Goals of Care:   Code Status: Limited: Do not attempt resuscitation (DNR) -DNR-LIMITED -Do Not Intubate/DNI    DVT prophylaxis: SCDs Start: 03/03/23 1402 Place and maintain sequential compression device Start: 03/02/23 1443 Eliquis   Interim Hx: The patient appears to have had an uneventful night in the ICU.  Her hemoglobin was also stable.  She is afebrile.  Her blood pressure has normalized with low-dose Levophed.  Assessment & Plan:  Persisting acute worsening hypotension in setting of chronic hypertension Has responded very well to low-dose Levophed  Closed displaced fracture right femoral neck status post mechanical fall Status post right hip hemiarthroplasty 1/9 per Dr. Carola Frost -postop care per orthopedics as follows:  Weightbearing: WBAT RLE ROM: posterior hip precautions R hip  Insicional and dressing care: Daily dressing changes with mepilex or 4x4 gauze and tape starting on 03/05/2022 BM:WUXLKG abx  Impediments to Fracture Healing: CKD, DM, fragility fracture Bone Health/Optimization: metabolic bone disease per renal  VTE prophylaxis:  ok to resume home eliquis  Follow - up plan: 2 weeks Contact information:  Myrene Galas MD, Montez Morita PA-C  Acute blood loss anemia on anemia of CKD Hemoglobin stable  ESRD on HD MWF Ongoing dialysis per Nephrology team  Chronic diastolic CHF Volume management per HD -no evidence of gross volume overload at present -EF normal with grade 3 DD per TTE March 2024  Chronic  hypotension Continue midodrine at increased dose with blood pressure now stabilized/improved  DM2 CBG currently well-controlled  Dementia with intermittent acute delirium The patient appears to be at her baseline presently such that she can interact with the examiner and answer all questions reliably  Chronic paroxysmal atrial fibrillation Continue usual amiodarone and Eliquis  Impaired mobility Wheelchair bound at baseline but typically able to transfer out of bed to chair -resume PT/OT when hemodynamically stable  Goals of care I met with the patient's 2 daughters at bedside 03/09/23 and discussed my concern that the patient's decline today could be an indication of a poor ability to recover from her surgery which could be an indication of an eventual decline to death -we spoke about goals of care -they report that the patient does not have a formal power of attorney at present but that they are her family and her caregivers -they state that she has been very clear she would "want everything done" including intubation/mechanical ventilation if she were to decline to the point that this was indicated -and subsequent discussions with the critical care team the patient's daughters did agree to DNR/DNI status  Pressure injury Pressure Injury 03/05/23 Buttocks Left Stage 2 -  Partial thickness loss of dermis presenting as a shallow open injury with a red, pink wound bed without slough. Pink wound bed, no slough. MASD from incontinence, blister (Active)  03/05/23 2025  Location: Buttocks  Location Orientation: Left  Staging: Stage 2 -  Partial thickness loss of dermis presenting as a shallow open injury with a red, pink wound bed without slough.  Wound Description (Comments): Pink wound  bed, no slough. MASD from incontinence, blister  Present on Admission:      Pressure Injury 03/08/23 Buttocks Right Stage 3 -  Full thickness tissue loss. Subcutaneous fat may be visible but bone, tendon or  muscle are NOT exposed. DTPI that has evolved to stage 3 R buttock 50% pink moist 50% tan fibrinous (Active)  03/08/23 1559  Location: Buttocks  Location Orientation: Right  Staging: Stage 3 -  Full thickness tissue loss. Subcutaneous fat may be visible but bone, tendon or muscle are NOT exposed.  Wound Description (Comments): DTPI that has evolved to stage 3 R buttock 50% pink moist 50% tan fibrinous  Present on Admission: No    Family Communication:  Disposition: Transferred to med/surg bed    Objective: Blood pressure (!) 132/53, pulse 92, temperature 98.1 F (36.7 C), temperature source Oral, resp. rate 16, height 5\' 2"  (1.575 m), weight 75.8 kg, SpO2 100%.  Intake/Output Summary (Last 24 hours) at 03/10/2023 0742 Last data filed at 03/10/2023 0700 Gross per 24 hour  Intake 389.08 ml  Output --  Net 389.08 ml   Filed Weights   03/03/23 0656 03/07/23 0927 03/07/23 1321  Weight: 76 kg 77.8 kg 75.8 kg    Examination: Care provided by PCCM today. TRH continues to follow w/ intent to resume care of patient.   CBC: Recent Labs  Lab 03/07/23 0439 03/09/23 1620 03/09/23 2015 03/10/23 0304  WBC 14.8* 13.8*  --  17.6*  NEUTROABS 12.1*  --   --   --   HGB 9.0* 9.2* 10.3* 9.7*  HCT 27.8* 28.7* 32.5* 30.6*  MCV 98.9 100.0  --  100.7*  PLT 204 274  --  316   Basic Metabolic Panel: Recent Labs  Lab 03/07/23 0439 03/09/23 0710 03/10/23 0304  NA 133* 134* 133*  K 5.1 5.1 4.8  CL 95* 94* 95*  CO2 24 24 24   GLUCOSE 164* 124* 158*  BUN 70* 57* 71*  CREATININE 8.19* 6.44* 7.54*  CALCIUM 9.0 9.2 8.8*  MG  --   --  2.2   GFR: Estimated Creatinine Clearance: 5.7 mL/min (A) (by C-G formula based on SCr of 7.54 mg/dL (H)).   Scheduled Meds:  (feeding supplement) PROSource Plus  30 mL Oral BID BM   acetaminophen  1,000 mg Oral TID   amiodarone  200 mg Oral Daily   Chlorhexidine Gluconate Cloth  6 each Topical Q0600   darbepoetin (ARANESP) injection - DIALYSIS  60 mcg  Subcutaneous Q Fri-1800   insulin aspart  0-5 Units Subcutaneous QHS   insulin aspart  0-9 Units Subcutaneous TID WC   insulin glargine-yfgn  20 Units Subcutaneous Daily   leptospermum manuka honey  1 Application Topical Daily   lidocaine  1 Application Topical Q M,W,F   liver oil-zinc oxide   Topical BID   melatonin  3 mg Oral QHS   midodrine  10 mg Oral TID WC   multivitamin  1 tablet Oral QHS   mouth rinse  15 mL Mouth Rinse 4 times per day   pantoprazole  40 mg Oral BID   senna-docusate  1 tablet Oral QHS     LOS: 8 days   Lonia Blood, MD [facility] Office  [phone] Pager - Text Page per Loretha Stapler  If 7PM-7AM, please contact night-coverage per Amion 03/10/2023, 7:42 AM

## 2023-03-10 NOTE — Progress Notes (Addendum)
Patient unresponsive again but still has pulse, staring straight ahead. Sternal rubbed performed. BP is high 90s systolic. Vomited x 1. Rinsed patient back.DO and unit nurse at bedside performing interventions. Patient alert, c/o nausea.

## 2023-03-10 NOTE — Progress Notes (Signed)
Spoke with Dr. Marisue Humble. Patient appears unable to tolerate tx today, she has went unresponsive twice, BP dropped to 70s and 50s systolic. UF off during the second occurrence. Dr. Marisue Humble said okay to be done with treatment for today.

## 2023-03-10 NOTE — Procedures (Signed)
Received patient in bed. Alert and oriented.  Informed consent signed and in chart.   Patient did not tolerate well, see progress notes.  Alert, without acute distress.  Hand-off given to patient's nurse.   Access used: lavf Access issues: none  Total UF removed: 0   Lu Duffel, RN [facility]

## 2023-03-10 NOTE — Progress Notes (Addendum)
RN was called into the room from the hemodialysis RN saying patient was "staring off into space". RN went into room to find patient unresponsive but still had a pulse, unchanged rhythm, and 93% O2 sat. Obtained BP via cuff, was 59/29 on 84mcg/min of Norepinephrine. RN titrated Norepinephrine up to reach a SBP>90. Hemodialysis RN said she had not pulled much fluid off yet, but will not pull any from this point forward. MD at bedside during this event. Patient came back to A&Ox4. At 1034, same situation occurred and pt BP was 68/49 still on 3 mcg/min of Norepinephrine. RN titrated Norepinephrine up to again reach a sys>90. Hemodialysis RN returned blood to patient and stopped treatment. MD at bedside during this event as well. Patient came back to A&Ox4.

## 2023-03-11 DIAGNOSIS — I5042 Chronic combined systolic (congestive) and diastolic (congestive) heart failure: Secondary | ICD-10-CM | POA: Diagnosis not present

## 2023-03-11 DIAGNOSIS — Z7189 Other specified counseling: Secondary | ICD-10-CM | POA: Diagnosis not present

## 2023-03-11 DIAGNOSIS — S72001A Fracture of unspecified part of neck of right femur, initial encounter for closed fracture: Secondary | ICD-10-CM | POA: Diagnosis not present

## 2023-03-11 DIAGNOSIS — Z515 Encounter for palliative care: Secondary | ICD-10-CM | POA: Diagnosis not present

## 2023-03-11 DIAGNOSIS — I959 Hypotension, unspecified: Secondary | ICD-10-CM | POA: Diagnosis not present

## 2023-03-11 LAB — CBC
HCT: 30.8 % — ABNORMAL LOW (ref 36.0–46.0)
Hemoglobin: 9.6 g/dL — ABNORMAL LOW (ref 12.0–15.0)
MCH: 31.7 pg (ref 26.0–34.0)
MCHC: 31.2 g/dL (ref 30.0–36.0)
MCV: 101.7 fL — ABNORMAL HIGH (ref 80.0–100.0)
Platelets: 326 10*3/uL (ref 150–400)
RBC: 3.03 MIL/uL — ABNORMAL LOW (ref 3.87–5.11)
RDW: 15.4 % (ref 11.5–15.5)
WBC: 16.6 10*3/uL — ABNORMAL HIGH (ref 4.0–10.5)
nRBC: 0.8 % — ABNORMAL HIGH (ref 0.0–0.2)

## 2023-03-11 LAB — RENAL FUNCTION PANEL
Albumin: 2.5 g/dL — ABNORMAL LOW (ref 3.5–5.0)
Anion gap: 15 (ref 5–15)
BUN: 79 mg/dL — ABNORMAL HIGH (ref 8–23)
CO2: 24 mmol/L (ref 22–32)
Calcium: 9.3 mg/dL (ref 8.9–10.3)
Chloride: 94 mmol/L — ABNORMAL LOW (ref 98–111)
Creatinine, Ser: 8.39 mg/dL — ABNORMAL HIGH (ref 0.44–1.00)
GFR, Estimated: 4 mL/min — ABNORMAL LOW (ref >60)
Glucose, Bld: 138 mg/dL — ABNORMAL HIGH (ref 70–99)
Phosphorus: 5.8 mg/dL — ABNORMAL HIGH (ref 2.5–4.6)
Potassium: 5 mmol/L (ref 3.5–5.1)
Sodium: 133 mmol/L — ABNORMAL LOW (ref 135–145)

## 2023-03-11 LAB — GLUCOSE, CAPILLARY
Glucose-Capillary: 117 mg/dL — ABNORMAL HIGH (ref 70–99)
Glucose-Capillary: 149 mg/dL — ABNORMAL HIGH (ref 70–99)
Glucose-Capillary: 110 mg/dL — ABNORMAL HIGH (ref 70–99)
Glucose-Capillary: 121 mg/dL — ABNORMAL HIGH (ref 70–99)

## 2023-03-11 MED ORDER — DOCUSATE SODIUM 100 MG PO CAPS
100.0000 mg | ORAL_CAPSULE | Freq: Two times a day (BID) | ORAL | Status: DC
  Administered 2023-03-11: 100 mg via ORAL
  Filled 2023-03-11 (×2): qty 1

## 2023-03-11 MED ORDER — MIDODRINE HCL 5 MG PO TABS
15.0000 mg | ORAL_TABLET | Freq: Three times a day (TID) | ORAL | Status: DC
  Administered 2023-03-11 – 2023-03-12 (×3): 15 mg via ORAL
  Filled 2023-03-11 (×3): qty 3

## 2023-03-11 MED ORDER — NEPRO/CARBSTEADY PO LIQD
237.0000 mL | Freq: Two times a day (BID) | ORAL | Status: DC
  Administered 2023-03-11: 237 mL via ORAL

## 2023-03-11 MED ORDER — ONDANSETRON HCL 4 MG/2ML IJ SOLN
4.0000 mg | Freq: Once | INTRAMUSCULAR | Status: AC
  Administered 2023-03-11: 4 mg via INTRAVENOUS
  Filled 2023-03-11: qty 2

## 2023-03-11 MED ORDER — POLYETHYLENE GLYCOL 3350 17 G PO PACK
17.0000 g | PACK | Freq: Two times a day (BID) | ORAL | Status: DC
  Administered 2023-03-11: 17 g via ORAL
  Filled 2023-03-11 (×2): qty 1

## 2023-03-11 MED ORDER — FENTANYL CITRATE PF 50 MCG/ML IJ SOSY
12.5000 ug | PREFILLED_SYRINGE | INTRAMUSCULAR | Status: DC | PRN
  Administered 2023-03-11: 12.5 ug via INTRAVENOUS
  Administered 2023-03-11 – 2023-03-12 (×4): 25 ug via INTRAVENOUS
  Filled 2023-03-11 (×5): qty 1

## 2023-03-11 NOTE — Progress Notes (Signed)
NAME:  Stephanie Woodward, MRN:  [id number], DOB:  Aug 30, 1940, LOS: 9 ADMISSION DATE:  03/02/2023, CONSULTATION DATE:  1/15 REFERRING MD:  [name] Seller CHIEF COMPLAINT:  hip pain   History of Present Illness:  Pt is an 83 yr old female with significant pmhx of cognitive impairment/dementia, ESRD (MWF), DM2, HTN, HLD, CHF, and chronic afib on eliquis, COPD, OSA, and obesity who presented to ED with complaints of right hip pain after a fall on 1/8. Per ED work up, a closed displaced right femoral neck fracture was found and Ortho was consulted. Patient underwent a right hip hemiarthroplasty on 1/9 by Dr. Carola Frost. On 1/15 patient progressive had worsening hypotension on top of chronic hypotension (receiving midodrine) and was given 500 cc fluid bolus challenge but hypotension remaining refractory. Due to ongoing hypotension and increase in tachycardia compared to baseline, PCCM was consulted to assist in management for hypotension.    Upon assessment at beside, patient was alert and oriented x 4 and able to answer questions appropriate despite hx of cognitive impairment. Discussed with patient the possible need of vasopressor support due to ongoing hypotension. Also, had GOC with patient and daughters at beside. Discussed extensively with patient and daughters against CPR, defibrillation, and mechanical ventilation in the event of cardiopulmonary arrest. We would continue full care but in the event of cardiac and or respiratory arrest we would not perform interventions just mentioned. Patient expressed she would not want to suffer if did have a cardiac or respiratory arrest and would want to be made DNR/DNI. This was witnessed by daughters at beside, along with PCCM MD Hunsucker.   Pertinent  Medical History  Dementia ESRD on HD MWF T2DM HTN HLD CHF Chronic afib on eliquis COPD/OSA  Significant Hospital Events: Including procedures, antibiotic start and stop dates in addition to other pertinent events   1/8  Mechanical fall, admit with right hip femoral neck fracture 1/9 Hemiarthroplasty of right hip fracture  1/15 PCCM consult due to ongoing hypotension; started on low dose peripheral levophed 1/16 unable to tolerate HD(even without UF) due to hypotension/unresponsive episode  Interim History / Subjective:  Seen with daughter, [name] at bedside. Continues to have back pain and is uncomfortable in bed. Has little appetite still and some ongoing nausea.   Objective   Blood pressure (!) 131/51, pulse 81, temperature 98.3 F (36.8 C), temperature source Oral, resp. rate 11, height 5\' 2"  (1.575 m), weight 75.8 kg, SpO2 98%.        Intake/Output Summary (Last 24 hours) at 03/11/2023 0649 Last data filed at 03/10/2023 2000 Gross per 24 hour  Intake 772.55 ml  Output -0.6 ml  Net 773.15 ml   Filed Weights   03/03/23 0656 03/07/23 0927 03/07/23 1321  Weight: 76 kg 77.8 kg 75.8 kg    Examination: General: chronically ill appearing, elderly female  Lungs: breathing comfortably on room air; clear breath sounds bilaterally Cardiovascular: regular rate, irregular rhythm  Abdomen: soft, nontender Extremities: LUE AVF with palpable thrill Neuro: alert and oriented x4, follows all commands   Resolved Hospital Problem list     Assessment & Plan:  83 year old ESRD on dialysis here with hip fracture whom we are consulting for hypotension.   Acute on chronic hypotension - unclear etiology, does not seem to be volume issue  - increase midodrine to 15 mg q8h - Wean levophed as able, SBP goal >90 - blood cultures with no growth at 2 days   ESRD on HD MWF - nephro consulted -  unable to tolerate HD 1/16 due to hypotension/Afib RVR - ongoing GOC conversations with patient and daughters; appreciate palliative care assistance  Displaced right femoral neck fracture s/p hemiarthroplasty on 1/9 - post op care per ortho - PT/OT  Acute on chronic anemia of chronic disease - Hb stable - aranesp q  friday  T2DM - semglee 20u daily + ssi  Chronic afib - amiodarone 200 mg daily  Best Practice (right click and "Reselect all SmartList Selections" daily)   Diet/type: Regular consistency (see orders) DVT prophylaxis SCD Pressure ulcer(s): identified on: 15/Jan GI prophylaxis: PPI Lines: N/A Foley:  N/A Code Status:  DNR Last date of multidisciplinary goals of care discussion [updated daughter at the bedside 1/17]  Labs   CBC: Recent Labs  Lab 03/05/23 0424 03/06/23 0450 03/07/23 0439 03/09/23 1620 03/09/23 2015 03/10/23 0304  WBC 13.4* 13.3* 14.8* 13.8*  --  17.6*  NEUTROABS  --   --  12.1*  --   --   --   HGB 8.6* 9.7* 9.0* 9.2* 10.3* 9.7*  HCT 26.7* 30.8* 27.8* 28.7* 32.5* 30.6*  MCV 98.5 99.4 98.9 100.0  --  100.7*  PLT 161 189 204 274  --  316    Basic Metabolic Panel: Recent Labs  Lab 03/05/23 0424 03/07/23 0439 03/09/23 0710 03/10/23 0304  NA 136 133* 134* 133*  K 4.2 5.1 5.1 4.8  CL 97* 95* 94* 95*  CO2 27 24 24 24   GLUCOSE 185* 164* 124* 158*  BUN 30* 70* 57* 71*  CREATININE 4.59* 8.19* 6.44* 7.54*  CALCIUM 8.6* 9.0 9.2 8.8*  MG  --   --   --  2.2   GFR: Estimated Creatinine Clearance: 5.7 mL/min (A) (by C-G formula based on SCr of 7.54 mg/dL (H)). Recent Labs  Lab 03/06/23 0450 03/07/23 0439 03/09/23 1620 03/10/23 0304  WBC 13.3* 14.8* 13.8* 17.6*    Liver Function Tests: Recent Labs  Lab 03/10/23 0304  AST 25  ALT 6  ALKPHOS 103  BILITOT 0.7  PROT 6.0*  ALBUMIN 2.5*   No results for input(s): "LIPASE", "AMYLASE" in the last 168 hours. No results for input(s): "AMMONIA" in the last 168 hours.  ABG    Component Value Date/Time   PHART 7.343 (L) 02/22/2021 0830   PCO2ART 43.9 02/22/2021 0830   PO2ART 65 (L) 02/22/2021 0830   HCO3 24.0 02/22/2021 0830   TCO2 31 03/03/2023 0740   ACIDBASEDEF 2.0 02/22/2021 0830   O2SAT 92.0 02/22/2021 0830     Coagulation Profile: No results for input(s): "INR", "PROTIME" in the last 168  hours.  Cardiac Enzymes: No results for input(s): "CKTOTAL", "CKMB", "CKMBINDEX", "TROPONINI" in the last 168 hours.  HbA1C: Hgb A1c MFr Bld  Date/Time Value Ref Range Status  03/02/2023 06:34 AM 6.8 (H) 4.8 - 5.6 % Final    Comment:    (NOTE)         Prediabetes: 5.7 - 6.4         Diabetes: >6.4         Glycemic control for adults with diabetes: <7.0   05/19/2022 09:50 PM 7.9 (H) 4.8 - 5.6 % Final    Comment:    (NOTE)         Prediabetes: 5.7 - 6.4         Diabetes: >6.4         Glycemic control for adults with diabetes: <7.0     CBG: Recent Labs  Lab 03/10/23 1020 03/10/23 1125  03/10/23 1551 03/10/23 1925 03/10/23 2128  GLUCAP 101* 112* 170* 191* 212*    Review of Systems:   pain  Past Medical History:  She,  has a past medical history of Arthritis, Asthma, Atrial fibrillation (HCC), CHF (congestive heart failure) (HCC), CKD (chronic kidney disease), stage III (HCC), COPD (chronic obstructive pulmonary disease) (HCC), Diabetes mellitus, Dialysis patient (HCC), Gout, Heart murmur, History of kidney stones, Hyperlipemia, Hypertension, Psoriasis, Renal disorder, Single kidney, and Sleep apnea.   Surgical History:   Past Surgical History:  Procedure Laterality Date   A/V FISTULAGRAM Left 10/31/2020   Procedure: A/V FISTULAGRAM;  Surgeon: Chuck Hint, MD;  Location: [facility] INVASIVE CV LAB;  Service: Cardiovascular;  Laterality: Left;   A/V FISTULAGRAM N/A 03/04/2023   Procedure: A/V Fistulagram;  Surgeon: Ethelene Hal, MD;  Location: [facility] INVASIVE CV LAB;  Service: Cardiovascular;  Laterality: N/A;   ABDOMINAL HYSTERECTOMY     AV FISTULA PLACEMENT Left 11/17/2018   Procedure: BRACHIO-CEPHALIC ARTERIOVENOUS (AV) FISTULA CREATION IN LEFT ARM;  Surgeon: Cephus Shelling, MD;  Location: [facility] OR;  Service: Vascular;  Laterality: Left;   CARDIOVERSION N/A 03/03/2021   Procedure: CARDIOVERSION;  Surgeon: Laurey Morale, MD;  Location: [facility] ENDOSCOPY;  Service:  Cardiovascular;  Laterality: N/A;   CARDIOVERSION N/A 03/12/2021   Procedure: CARDIOVERSION;  Surgeon: Laurey Morale, MD;  Location: [facility] ENDOSCOPY;  Service: Cardiovascular;  Laterality: N/A;   CHOLECYSTECTOMY     HIP ARTHROPLASTY Right 03/03/2023   Procedure: ARTHROPLASTY BIPOLAR HIP (HEMIARTHROPLASTY) POSTERIOR;  Surgeon: Myrene Galas, MD;  Location: [facility] OR;  Service: Orthopedics;  Laterality: Right;   INSERTION OF DIALYSIS CATHETER Right 01/26/2019   Procedure: INSERTION OF DIALYSIS CATHETER, right internal jugular;  Surgeon: Chuck Hint, MD;  Location: [facility] OR;  Service: Vascular;  Laterality: Right;   LEFT AND RIGHT HEART CATHETERIZATION WITH CORONARY ANGIOGRAM N/A 11/29/2013   Procedure: LEFT AND RIGHT HEART CATHETERIZATION WITH CORONARY ANGIOGRAM;  Surgeon: Othella Boyer, MD;  Location: [facility] CATH LAB;  Service: Cardiovascular;  Laterality: N/A;   LEFT HEART CATH AND CORONARY ANGIOGRAPHY N/A 02/24/2021   Procedure: LEFT HEART CATH AND CORONARY ANGIOGRAPHY;  Surgeon: Laurey Morale, MD;  Location: [facility] INVASIVE CV LAB;  Service: Cardiovascular;  Laterality: N/A;   PERIPHERAL VASCULAR INTERVENTION Left 10/31/2020   Procedure: PERIPHERAL VASCULAR INTERVENTION;  Surgeon: Chuck Hint, MD;  Location: [facility] INVASIVE CV LAB;  Service: Cardiovascular;  Laterality: Left;   RIGHT HEART CATH N/A 11/23/2017   Procedure: RIGHT HEART CATH;  Surgeon: Laurey Morale, MD;  Location: [facility] INVASIVE CV LAB;  Service: Cardiovascular;  Laterality: N/A;   RIGHT/LEFT HEART CATH AND CORONARY ANGIOGRAPHY N/A 06/15/2019   Procedure: RIGHT/LEFT HEART CATH AND CORONARY ANGIOGRAPHY;  Surgeon: Laurey Morale, MD;  Location: [facility] INVASIVE CV LAB;  Service: Cardiovascular;  Laterality: N/A;     Social History:   reports that she quit smoking about 14 years ago. Her smoking use included cigarettes. She started smoking about 34 years ago. She has a 20 pack-year smoking history. She has never used smokeless tobacco.  She reports current alcohol use. She reports that she does not currently use drugs.   Family History:  Her family history includes CVA (age of onset: 51) in her father; Cancer (age of onset: 25) in her brother; Cancer (age of onset: 67) in her mother; Lupus in her daughter.   Allergies Allergies  Allergen Reactions   Egg-Derived Products Nausea And Vomiting   Lisinopril Nausea And Vomiting  Penicillins Nausea And Vomiting    Has patient had a PCN reaction causing immediate rash, facial/tongue/throat swelling, SOB or lightheadedness with hypotension: No Has patient had a PCN reaction causing severe rash involving mucus membranes or skin necrosis: No Has patient had a PCN reaction that required hospitalization: No Has patient had a PCN reaction occurring within the last 10 years: No If all of the above answers are "NO", then may proceed with Cephalosporin use.    Tramadol     Hallucinations, mental status changes     Home Medications  Prior to Admission medications   Medication Sig Start Date End Date Taking? Authorizing Provider  acetaminophen (TYLENOL) 500 MG tablet Take 1,000 mg by mouth in the morning and at bedtime.   Yes [provider]  amiodarone (PACERONE) 200 MG tablet Take 200 mg by mouth daily.   Yes [provider]  atorvastatin (LIPITOR) 80 MG tablet Take 80 mg by mouth at bedtime.   Yes [provider]  chlorhexidine (HIBICLENS) 4 % external liquid Apply 15 mLs (1 Application total) topically as directed for 30 doses. Use as directed daily for 5 days every other week for 6 weeks. 03/03/23  Yes Montez Morita, PA-C  Cholecalciferol (VITAMIN D3) 25 MCG (1000 UT) tablet Take 1,000 Units by mouth daily.   Yes [provider]  ELIQUIS 5 MG TABS tablet Take 1 tablet by mouth twice daily Patient taking differently: Take 2.5 mg by mouth 2 (two) times daily. 07/23/20  Yes Laurey Morale, MD  Insulin Glargine (SEMGLEE [place]) Inject 20 Units into the skin  in the morning.   Yes [provider]  lidocaine (LINDAMANTLE) 3 % CREA cream Apply 1 application  topically every Monday, Wednesday, and Friday. Prior to dialysis 03/06/20  Yes [provider]  midodrine (PROAMATINE) 10 MG tablet Take 10 mg by mouth 3 (three) times a week. Taken before dialysis on Monday, Wednesday, Friday Hold if SBP>130   Yes [provider]  mupirocin ointment (BACTROBAN) 2 % Place 1 Application into the nose 2 (two) times daily for 60 doses. Use as directed 2 times daily for 5 days every other week for 6 weeks. 03/03/23 04/02/23 Yes Montez Morita, PA-C  pantoprazole (PROTONIX) 40 MG tablet Take 1 tablet (40 mg total) by mouth 2 (two) times daily. 05/20/22  Yes Tyrone Nine, MD  Pramoxine-Calamine (AVEENO ANTI-ITCH EX) Apply 1 application  topically every 6 (six) hours as needed (itching).   Yes [provider]  nitroGLYCERIN (NITROSTAT) 0.4 MG SL tablet Place 0.4 mg under the tongue every 5 (five) minutes as needed for chest pain. Patient not taking: Reported on 03/02/2023    [provider]     Critical care time: 30 min

## 2023-03-11 NOTE — Progress Notes (Signed)
[facility] Progress Note   Subjective:    Poorly tolerated dialysis yesterday evening without attempted UF because of hemodynamic instability Daughters are exploring goals of care, palliative to see Again discussed current status with daughter this morning Patient more awake and alert this morning Remains on norepinephrine  Objective Vitals:   03/11/23 0615 03/11/23 0630 03/11/23 0755 03/11/23 0800  BP: (!) 124/39 (!) 131/51  (!) 105/48  Pulse: 91 81  (!) 117  Resp: 12 11  14   Temp:   98.4 F (36.9 C)   TempSrc:   Oral   SpO2: 99% 98%  (!) 88%  Weight:      Height:       Physical Exam General:chronically ill appearing, elderly female in NAD Heart:RRR Lungs:CTAB, nml WOB on  Abdomen:soft, NTND Extremities:trace LE edema Dialysis Access: LU AVF +b/t   Filed Weights   03/03/23 0656 03/07/23 0927 03/07/23 1321  Weight: 76 kg 77.8 kg 75.8 kg    Intake/Output Summary (Last 24 hours) at 03/11/2023 1036 Last data filed at 03/11/2023 0800 Gross per 24 hour  Intake 723.05 ml  Output -0.6 ml  Net 723.65 ml     Additional Objective Labs: Basic Metabolic Panel: Recent Labs  Lab 03/07/23 0439 03/09/23 0710 03/10/23 0304  NA 133* 134* 133*  K 5.1 5.1 4.8  CL 95* 94* 95*  CO2 24 24 24   GLUCOSE 164* 124* 158*  BUN 70* 57* 71*  CREATININE 8.19* 6.44* 7.54*  CALCIUM 9.0 9.2 8.8*   Liver Function Tests: Recent Labs  Lab 03/10/23 0304  AST 25  ALT 6  ALKPHOS 103  BILITOT 0.7  PROT 6.0*  ALBUMIN 2.5*    CBC: Recent Labs  Lab 03/05/23 0424 03/06/23 0450 03/07/23 0439 03/09/23 1620 03/09/23 2015 03/10/23 0304  WBC 13.4* 13.3* 14.8* 13.8*  --  17.6*  NEUTROABS  --   --  12.1*  --   --   --   HGB 8.6* 9.7* 9.0* 9.2* 10.3* 9.7*  HCT 26.7* 30.8* 27.8* 28.7* 32.5* 30.6*  MCV 98.5 99.4 98.9 100.0  --  100.7*  PLT 161 189 204 274  --  316   Blood Culture    Component Value Date/Time   SDES BLOOD RIGHT HAND 03/09/2023 2018   SPECREQUEST   03/09/2023 2018    BOTTLES DRAWN AEROBIC AND ANAEROBIC Blood Culture results may not be optimal due to an inadequate volume of blood received in culture bottles   CULT  03/09/2023 2018    NO GROWTH 2 DAYS Performed at [facility], [address], [place], [place] [address]    REPTSTATUS PENDING 03/09/2023 2018     CBG: Recent Labs  Lab 03/10/23 1125 03/10/23 1551 03/10/23 1925 03/10/23 2128 03/11/23 0757  GLUCAP 112* 170* 191* 212* 117*    Medications:  anticoagulant sodium citrate     norepinephrine (LEVOPHED) Adult infusion 4 mcg/min (03/11/23 0800)    (feeding supplement) PROSource Plus  30 mL Oral BID BM   amiodarone  200 mg Oral Daily   apixaban  2.5 mg Oral BID   Chlorhexidine Gluconate Cloth  6 each Topical Q0600   vitamin D3  1,000 Units Oral Daily   darbepoetin (ARANESP) injection - DIALYSIS  60 mcg Subcutaneous Q Fri-1800   insulin aspart  0-5 Units Subcutaneous QHS   insulin aspart  0-9 Units Subcutaneous TID WC   insulin glargine-yfgn  20 Units Subcutaneous Daily   leptospermum manuka honey  1 Application Topical  Daily   lidocaine  1 patch Transdermal Daily   lidocaine  1 Application Topical Q M,W,F   liver oil-zinc oxide   Topical BID   melatonin  3 mg Oral QHS   midodrine  15 mg Oral Q8H   multivitamin  1 tablet Oral QHS   mouth rinse  15 mL Mouth Rinse 4 times per day   pantoprazole  40 mg Oral BID   senna-docusate  1 tablet Oral QHS    Dialysis Orders: [facility] MWF  4h   350/600   77.3kg   2/2 bath   AVF LUA  Heparin none - hectorol 9 mcg  - last Hb 12.0   Assessment/Plan: Right hip fracture: fall at home, unipolar hemiarthroplasty of right hip on 1/9, slow recovery, now with decline in ICU ESRD: MWF HD.  Did not tolerate dialysis 03/10/2023 HTN/volume:  Hypotensive chronically. Worsened now on NE ICU.  Gets midodrine pre-hd.  Anemia of ESRD: Hgb 9.s-10 Aranesp 60 mcg given 03/04/23.  Secondary HPTH: Last Ca and phos good. Not on binder.  Hectorol 9 mcg Nutrition: Albumin 2.5.  Carb modified diet. Continue supplements.  Dementia: chronic, stable, baseline DM: continue with lantus PAF: continue with amio. Eliquis on hold Combined diastolic/systolic CHF: EF at 55-60%, still G3DD Prolonged bleeding of fistula: fistulagram 03/03/22. No evidence of stenotic segments, inflow and centrals widely patient, cephalic vein fistula is orthogonal to rest of cephalic vein, need to map out cephalic vein to prevent cannulating close to orthogonal point. AVF used successfully on 1/13.  Dispo: now not improving after a week from surgery.    With her difficulty tolerating dialysis on 03/10/2023 I have been upfront with the family that I think that we are looking at further complications with little expectation of benefit.  Prognosis has further worsened.  I think we are reaching a point where discontinuation of dialysis is reasonable given little perceived benefit and clearly identified harm.  Palliative to see and I am happy to meet with the family during that time as well.  No further dialysis pending his conversation.  Arita Miss, MD  [facility] 03/11/2023,10:36 AM

## 2023-03-11 NOTE — Progress Notes (Signed)
Nutrition Follow-up  DOCUMENTATION CODES:  Not applicable  INTERVENTION:  Liberalize diet to allow for more choices Nepro Shake po BID, each supplement provides 425 kcal and 19 grams protein Renal MVI daily  NUTRITION DIAGNOSIS:  Increased nutrient needs related to hip fracture, post-op healing as evidenced by estimated needs. - remains applicable  GOAL:  Patient will meet greater than or equal to 90% of their needs - progressing, diet and supplements in place  MONITOR:  PO intake  REASON FOR ASSESSMENT:  Consult Assessment of nutrition requirement/status  ASSESSMENT:  83 y/o female presented to the ED with hip pain after a fall. Wheelchair bound at baseline. Imaging showed fracture of right femoral neck.  PMH: CHF, A-fib, solitary kidney, ESRD on HD, DM type 2, dementia, asthma, COPD, gout, HLD, HTN, and tobacco use.  1/8 - presented to ED 1/9 - Op, hemiarthroplasty of the right hip 1/10 - fistulagram, no concerns noted 1/15 - Rapid response for hypotension, transferred to ICU  Pt resting in bed at the time of assessment. Awake and alert and able to provide a hx. iHD attempted 2x yesterday with pt experiencing unresponsiveness with each attempt. No fluid able to be removed and PMT meeting to be held with family today.   Pt reports that this morning she had grits, bacon, and peaches. States that appetite is fair but that for several weeks PTA she had not been eating as much. Discussed nutrition supplements. Pt reports that she likes butter pecan flavors. Offered ensure and nepro to give several flavor choices. Pt prefers wild berry - added Nepro BID.  Admit weight: 75.8 kg EDW at HD:  77.3 kg Current weight: 75.8 kg   Intake/Output Summary (Last 24 hours) at 03/11/2023 1510 Last data filed at 03/11/2023 1400 Gross per 24 hour  Intake 482.17 ml  Output --  Net 482.17 ml  Net IO Since Admission: 397.8 mL [03/11/23 1510]  Average Meal Intake: 1/8-1/16: 60% intake x 7  recorded meals  Nutritionally Relevant Medications: Scheduled Meds:  PROSource Plus  30 mL Oral BID BM   vitamin D3  1,000 Units Oral Daily   insulin aspart  0-5 Units Subcutaneous QHS   insulin aspart  0-9 Units Subcutaneous TID WC   insulin glargine-yfgn  20 Units Subcutaneous Daily   multivitamin  1 tablet Oral QHS   pantoprazole  40 mg Oral BID   senna-docusate  1 tablet Oral QHS   Continuous Infusions:  norepinephrine (LEVOPHED) Adult infusion 4 mcg/min (03/11/23 0800)   PRN Meds: polyethylene glycol  Labs Reviewed: Na 133, chloride 94 BUN 79, creatinine 8.39 Phosphorus 5.8 CBG ranges from 101-212 mg/dL over the last 24 hours HgbA1c 6.8%  NUTRITION - FOCUSED PHYSICAL EXAM: Flowsheet Row Most Recent Value  Orbital Region No depletion  Upper Arm Region No depletion  Thoracic and Lumbar Region No depletion  Buccal Region No depletion  Temple Region No depletion  Clavicle Bone Region No depletion  Clavicle and Acromion Bone Region No depletion  Scapular Bone Region No depletion  Dorsal Hand No depletion  Patellar Region No depletion  Anterior Thigh Region No depletion  Posterior Calf Region No depletion  Edema (RD Assessment) Mild  [R leg]  Hair Reviewed  Eyes Reviewed  Mouth Reviewed  Skin Reviewed  Nails Unable to assess  [painted nails]    Diet Order:   Diet Order             Diet regular Room service appropriate? Yes; Fluid consistency: Thin  Diet effective now                   EDUCATION NEEDS:  No education needs have been identified at this time  Skin:  Skin Integrity Issues: Stage II:  - Left buttock (7 x 3 cm) Stage III: - Right buttock (5 x 5 cm) Incisions:  - Right leg  Last BM:  03/07/2023  Height:  Ht Readings from Last 1 Encounters:  03/03/23 5\' 2"  (1.575 m)    Weight:  Wt Readings from Last 1 Encounters:  03/07/23 75.8 kg    Ideal Body Weight:  50 kg  BMI:  Body mass index is 30.56 kg/m.  Estimated Nutritional  Needs:  Kcal:  1500-1700 kcal/d Protein:  70-90 g/d Fluid:  1L+UOP    Greig Castilla, RD, LDN Registered Dietitian II Please reach out via secure chat Weekend on-call pager # available in [facility]

## 2023-03-11 NOTE — Progress Notes (Signed)
PT Cancellation Note  Patient Details Name: Stephanie [facility] MRN: [id number] DOB: 12-24-1940   Cancelled Treatment:    Reason Eval/Treat Not Completed: Other (comment). Pt declines mobilizing at this time, reporting that she is somewhat comfortable currently and has been having a lot of pain recently. PT will continue to follow as time allows.   Arlyss Gandy 03/11/2023, 3:02 PM

## 2023-03-11 NOTE — TOC Progression Note (Signed)
Transition of Care [facility]) - Progression Note    Patient Details  Name: Stephanie Woodward MRN: [id number] Date of Birth: 1940/06/17  Transition of Care [facility]) CM/SW Contact  Marliss Coots, LCSW Phone Number: 03/11/2023, 2:04 PM  Clinical Narrative:     2:04 PM Per chart review, palliative care is following patient as she has been unable to tolerate hemodialysis. TOC will continue to follow.   Expected Discharge Plan: Skilled Nursing Facility Barriers to Discharge: Continued Medical Work up  Expected Discharge Plan and Services In-house Referral: Clinical Social Work   Post Acute Care Choice: Skilled Nursing Facility Living arrangements for the past 2 months: Apartment                                       Social Determinants of Health (SDOH) Interventions SDOH Screenings   Food Insecurity: No Food Insecurity (03/02/2023)  Housing: Low Risk  (03/02/2023)  Transportation Needs: No Transportation Needs (03/02/2023)  Utilities: Not At Risk (03/02/2023)  Depression (PHQ2-9): Low Risk  (12/03/2019)  Financial Resource Strain: Low Risk  (02/27/2021)  Social Connections: Moderately Isolated (03/02/2023)  Tobacco Use: Medium Risk (03/03/2023)    Readmission Risk Interventions     No data to display

## 2023-03-11 NOTE — Progress Notes (Signed)
Palliative Medicine Progress Note   Patient Name: Stephanie Woodward       Date: 03/11/2023 DOB: Feb 05, 1941  Age: 83 y.o. MRN#: [id number] Attending Physician: Lynnell Catalan, MD Primary Care Physician: [facility] Admit Date: 03/02/2023  Reason for Consultation/Follow-up: {Reason for Consult:23484}  HPI/Patient Profile: 83 y.o. female  with past medical history of ESRD on HD (MWF), cognitive impairment/dementia, DM 2, chronic A-fib on Eliquis, HTN, HLD, COPD, OSA, and obesity who presented to the ED with complaints of right hip pain after a fall on 03/02/2023.  She was found to have a closed displaced right femoral neck fracture and Ortho was consulted.  Patient underwent a right hip hemiarthroplasty on 1/9 by Dr. Carola Frost.  On 1/15, patient developed developed worsening hypotension on top of chronic hypotension (on midodrine).    Palliative Medicine was consulted for goals of care and medical decision making.    Subjective: This NP, along with Dr. Marisue Humble, met with patient and her 2 daughters today at bedside.   Objective:  Physical Exam          Vital Signs: BP (!) 105/48 (BP Location: Right Arm)   Pulse (!) 117   Temp 97.9 F (36.6 C) (Oral)   Resp 14   Ht 5\' 2"  (1.575 m)   Wt 75.8 kg   SpO2 (!) 88%   BMI 30.56 kg/m  SpO2: SpO2: (!) 88 % O2 Device: O2 Device: Nasal Cannula O2 Flow Rate: O2 Flow Rate (L/min): 3 L/min  Intake/output summary:  Intake/Output Summary (Last 24 hours) at 03/11/2023 1354 Last data filed at 03/11/2023 0800 Gross per 24 hour  Intake 532.21 ml  Output --  Net 532.21 ml    LBM: Last BM Date :  (PTA)     Palliative Assessment/Data: ***     Palliative Medicine Assessment & Plan   Assessment: Principal Problem:    Closed displaced fracture of right femoral neck (HCC) Active Problems:   Type 2 diabetes mellitus, controlled, with renal complications (HCC)   Chronic combined systolic and diastolic heart failure (HCC)   ESRD on hemodialysis (HCC)   Hypotension   Paroxysmal atrial fibrillation (HCC)   Dementia without behavioral disturbance (HCC)    Recommendations/Plan: Continue current supportive interventions No further dialysis Allow for unrestricted visitation Fentanyl 12.5-25 mcg IV every  3 hours prn for pain Likely transition to comfort care tomorrow PMT will follow-up tomorrow  Code Status: DNR - Limited   Prognosis:  < 2 weeks  Discharge Planning: To Be Determined  Care plan was discussed with ***  Thank you for allowing the Palliative Medicine Team to assist in the care of this patient.   ***   Merry Proud, NP   Please contact Palliative Medicine Team phone at [phone] for questions and concerns.  For individual providers, please see AMION.

## 2023-03-11 NOTE — Progress Notes (Signed)
Pharmacy ICU Bowel Regimen Consult Note   Current Inpatient Medications for Bowel Management:  Senna-docusate 1 tablet at bedtime   Assessment: Stephanie Woodward is a 83 y.o. year old female admitted on 03/02/2023. Constipation identified as non-opioid-induced constipation . Bowel regimen assessment completed by Hale Drone, RN on 03/11/23. LBM on 03/06/23.  [x]  Bowel sounds present  [x]  No abdominal tenderness  []  Passing gas   Plan: Start Docusate 100 mg BID + Polyethylene glycol 17 gm BID MD contacted (if needed): Dr. Denese Killings  Thank you for allowing pharmacy to participate in this patient's care.  Enos Fling, PharmD PGY-1 Acute Care Pharmacy Resident 03/11/2023 11:21 AM

## 2023-03-11 NOTE — Progress Notes (Signed)
OT Cancellation Note  Patient Details Name: Stephanie Woodward MRN: [id number] DOB: [birth year]   Cancelled Treatment:    Reason Eval/Treat Not Completed: Patient politely declined, somewhat comfortably positioned and spending time with her family. Will continue to follow.  Evern Bio 03/11/2023, 3:03 PM Berna Spare, OTR/L Acute Rehabilitation Services Office: [phone]

## 2023-03-11 NOTE — Plan of Care (Signed)

## 2023-03-12 DIAGNOSIS — I5042 Chronic combined systolic (congestive) and diastolic (congestive) heart failure: Secondary | ICD-10-CM | POA: Diagnosis not present

## 2023-03-12 DIAGNOSIS — S72001A Fracture of unspecified part of neck of right femur, initial encounter for closed fracture: Secondary | ICD-10-CM | POA: Diagnosis not present

## 2023-03-12 DIAGNOSIS — I959 Hypotension, unspecified: Secondary | ICD-10-CM | POA: Diagnosis not present

## 2023-03-12 DIAGNOSIS — N186 End stage renal disease: Secondary | ICD-10-CM | POA: Diagnosis not present

## 2023-03-12 LAB — GLUCOSE, CAPILLARY
Glucose-Capillary: 79 mg/dL (ref 70–99)
Glucose-Capillary: 78 mg/dL (ref 70–99)

## 2023-03-12 MED ORDER — CLONAZEPAM 0.25 MG PO TBDP
0.5000 mg | ORAL_TABLET | Freq: Once | ORAL | Status: AC
  Administered 2023-03-12: 0.5 mg via ORAL
  Filled 2023-03-12: qty 2

## 2023-03-12 MED ORDER — CLONAZEPAM 0.25 MG PO TBDP
0.5000 mg | ORAL_TABLET | Freq: Once | ORAL | Status: AC
Start: 2023-03-13 — End: 2023-03-13
  Administered 2023-03-13: 0.5 mg via ORAL
  Filled 2023-03-12: qty 2

## 2023-03-12 MED ORDER — SENNA 8.6 MG PO TABS: 2.0000 | ORAL_TABLET | Freq: Every day | ORAL | Status: DC

## 2023-03-12 MED ORDER — HYDROMORPHONE HCL 1 MG/ML IJ SOLN
0.5000 mg | INTRAMUSCULAR | Status: DC | PRN
Start: 2023-03-12 — End: 2023-03-14
  Administered 2023-03-12: 1 mg via INTRAVENOUS
  Administered 2023-03-13: 0.5 mg via INTRAVENOUS
  Administered 2023-03-13: 1 mg via INTRAVENOUS
  Administered 2023-03-13: 0.5 mg via INTRAVENOUS
  Filled 2023-03-12 (×5): qty 1

## 2023-03-12 MED ORDER — FENTANYL CITRATE PF 50 MCG/ML IJ SOSY
25.0000 ug | PREFILLED_SYRINGE | INTRAMUSCULAR | Status: DC | PRN
  Administered 2023-03-12 – 2023-03-13 (×4): 50 ug via INTRAVENOUS
  Filled 2023-03-12 (×4): qty 1

## 2023-03-12 NOTE — Progress Notes (Signed)
[facility] Progress Note   Subjective:    Participated in GOC conversation with palliative care, daughters, patient yesterday. Discussed her current status and decline during this admission Recommended no further dialysis because of her inability to tolerate it safely and patient/family in agreement Likely to transition to comfort measures and hospice This morning daughter at bedside, she has no complaints or concerns, patient resting   Objective Vitals:   03/12/23 0615 03/12/23 0700 03/12/23 0745 03/12/23 0800  BP: (!) 121/46 (!) 116/54 (!) 112/55 (!) 117/54  Pulse: 80 72 81 79  Resp: 13 15 13 15   Temp:   (!) 97.2 F (36.2 C)   TempSrc:   Axillary   SpO2: 100% 100% 100% 100%  Weight:      Height:       Physical Exam Exam deferred, patient sleeping  Filed Weights   03/03/23 0656 03/07/23 0927 03/07/23 1321  Weight: 76 kg 77.8 kg 75.8 kg    Intake/Output Summary (Last 24 hours) at 03/12/2023 0954 Last data filed at 03/12/2023 0800 Gross per 24 hour  Intake 268.6 ml  Output --  Net 268.6 ml     Additional Objective Labs: Basic Metabolic Panel: Recent Labs  Lab 03/09/23 0710 03/10/23 0304 03/11/23 1022  NA 134* 133* 133*  K 5.1 4.8 5.0  CL 94* 95* 94*  CO2 24 24 24   GLUCOSE 124* 158* 138*  BUN 57* 71* 79*  CREATININE 6.44* 7.54* 8.39*  CALCIUM 9.2 8.8* 9.3  PHOS  --   --  5.8*   Liver Function Tests: Recent Labs  Lab 03/10/23 0304 03/11/23 1022  AST 25  --   ALT 6  --   ALKPHOS 103  --   BILITOT 0.7  --   PROT 6.0*  --   ALBUMIN 2.5* 2.5*    CBC: Recent Labs  Lab 03/06/23 0450 03/07/23 0439 03/09/23 1620 03/09/23 2015 03/10/23 0304 03/11/23 1022  WBC 13.3* 14.8* 13.8*  --  17.6* 16.6*  NEUTROABS  --  12.1*  --   --   --   --   HGB 9.7* 9.0* 9.2* 10.3* 9.7* 9.6*  HCT 30.8* 27.8* 28.7* 32.5* 30.6* 30.8*  MCV 99.4 98.9 100.0  --  100.7* 101.7*  PLT 189 204 274  --  316 326   Blood Culture    Component Value Date/Time    SDES BLOOD RIGHT HAND 03/09/2023 2018   SPECREQUEST  03/09/2023 2018    BOTTLES DRAWN AEROBIC AND ANAEROBIC Blood Culture results may not be optimal due to an inadequate volume of blood received in culture bottles   CULT  03/09/2023 2018    NO GROWTH 3 DAYS Performed at [facility], [address] [place], [place] [address]    REPTSTATUS PENDING 03/09/2023 2018     CBG: Recent Labs  Lab 03/11/23 0757 03/11/23 1125 03/11/23 1542 03/11/23 2135 03/12/23 0742  GLUCAP 117* 149* 110* 121* 79    Medications:  anticoagulant sodium citrate     norepinephrine (LEVOPHED) Adult infusion 2 mcg/min (03/12/23 0800)    (feeding supplement) PROSource Plus  30 mL Oral BID BM   amiodarone  200 mg Oral Daily   apixaban  2.5 mg Oral BID   Chlorhexidine Gluconate Cloth  6 each Topical Q0600   vitamin D3  1,000 Units Oral Daily   darbepoetin (ARANESP) injection - DIALYSIS  60 mcg Subcutaneous Q Fri-1800   docusate sodium  100 mg Oral BID   feeding supplement (  NEPRO CARB STEADY)  237 mL Oral BID BM   insulin aspart  0-5 Units Subcutaneous QHS   insulin aspart  0-9 Units Subcutaneous TID WC   insulin glargine-yfgn  20 Units Subcutaneous Daily   leptospermum manuka honey  1 Application Topical Daily   lidocaine  1 patch Transdermal Daily   lidocaine  1 Application Topical Q M,W,F   liver oil-zinc oxide   Topical BID   melatonin  3 mg Oral QHS   midodrine  15 mg Oral Q8H   multivitamin  1 tablet Oral QHS   mouth rinse  15 mL Mouth Rinse 4 times per day   pantoprazole  40 mg Oral BID   polyethylene glycol  17 g Oral BID    Dialysis Orders: [facility] MWF  4h   350/600   77.3kg   2/2 bath   AVF LUA  Heparin none - hectorol 9 mcg  - last Hb 12.0   Assessment/Plan: Right hip fracture: fall at home, unipolar hemiarthroplasty of right hip on 1/9, slow recovery, now with decline in ICU ESRD: MWF HD.  Did not tolerate dialysis 03/10/2023, has now chosen to discontinue  dialysis.   Patient and family have made reasonable decision to discontinue dialysis.  Palliative will assist with transition to comfort measures and hospice.  Will sign off at the current time but we will be available for questions.  Please reach out if needed.  Arita Miss, MD  [facility] 03/12/2023,9:54 AM

## 2023-03-12 NOTE — Progress Notes (Signed)
eLink Physician-Brief Progress Note Patient Name: Stephanie Woodward DOB: 07-13-1940 MRN: [id number]   Date of Service  03/12/2023  HPI/Events of Note    eICU Interventions     Chest pain Anxiety/insmnia  EKG CK CK-MB Troponin Klonopin     Stephanie Woodward 03/12/2023, 11:17 PM  Addendum  Discussed with bedside RN , Ro Discussed with Daughter on the phone , who discussed with her sister (other daughter) Family wants nO diagnostics at this time Want to give mom her medicine right now esp whatever make her comfortable So we will continue with patient medications at this time

## 2023-03-12 NOTE — Progress Notes (Signed)
Daughter at bedside has requested to hold off all meds, except pain meds, until she speaks w/her other sister.   She also doesn't want any meds that will have [place] turn pt or cause any pain.   Notified Dr. Ned Card.

## 2023-03-12 NOTE — Progress Notes (Addendum)
NAME:  Stephanie Woodward, MRN:  [id number], DOB:  May 03, 1940, LOS: 10 ADMISSION DATE:  03/02/2023, CONSULTATION DATE:  1/15 REFERRING MD:  [name] Seller CHIEF COMPLAINT:  hip pain   History of Present Illness:  Pt is an 83 yr old female with significant pmhx of cognitive impairment/dementia, ESRD (MWF), DM2, HTN, HLD, CHF, and chronic afib on eliquis, COPD, OSA, and obesity who presented to ED with complaints of right hip pain after a fall on 1/8. Per ED work up, a closed displaced right femoral neck fracture was found and Ortho was consulted. Patient underwent a right hip hemiarthroplasty on 1/9 by Dr. Carola Frost. On 1/15 patient progressive had worsening hypotension on top of chronic hypotension (receiving midodrine) and was given 500 cc fluid bolus challenge but hypotension remaining refractory. Due to ongoing hypotension and increase in tachycardia compared to baseline, PCCM was consulted to assist in management for hypotension.    Upon assessment at beside, patient was alert and oriented x 4 and able to answer questions appropriate despite hx of cognitive impairment. Discussed with patient the possible need of vasopressor support due to ongoing hypotension. Also, had GOC with patient and daughters at beside. Discussed extensively with patient and daughters against CPR, defibrillation, and mechanical ventilation in the event of cardiopulmonary arrest. We would continue full care but in the event of cardiac and or respiratory arrest we would not perform interventions just mentioned. Patient expressed she would not want to suffer if did have a cardiac or respiratory arrest and would want to be made DNR/DNI. This was witnessed by daughters at beside, along with PCCM MD Hunsucker.   Pertinent  Medical History  Dementia ESRD on HD MWF T2DM HTN HLD CHF Chronic afib on eliquis COPD/OSA  Significant Hospital Events: Including procedures, antibiotic start and stop dates in addition to other pertinent events   1/8  Mechanical fall, admit with right hip femoral neck fracture 1/9 Hemiarthroplasty of right hip fracture  1/15 PCCM consult due to ongoing hypotension; started on low dose peripheral levophed 1/16 unable to tolerate HD(even without UF) due to hypotension/unresponsive episode 1/17 decision made to no longer do HD  Interim History / Subjective:  Seen with daughter at the bedside. She didn't sleep well last night until after 2a when she got klonopin. Still having pain in her low back/sacrum.    Objective   Blood pressure (!) 121/46, pulse 80, temperature (!) 97.5 F (36.4 C), temperature source Axillary, resp. rate 13, height 5\' 2"  (1.575 m), weight 75.8 kg, SpO2 100%.        Intake/Output Summary (Last 24 hours) at 03/12/2023 0636 Last data filed at 03/12/2023 0600 Gross per 24 hour  Intake 290.49 ml  Output --  Net 290.49 ml   Filed Weights   03/03/23 0656 03/07/23 0927 03/07/23 1321  Weight: 76 kg 77.8 kg 75.8 kg    Examination: General: chronically ill appearing, elderly female  Lungs: breathing comfortably on room air; clear breath sounds bilaterally Cardiovascular: regular rate, irregular rhythm  Abdomen: soft, nontender Extremities: LUE AVF with palpable thrill Neuro: alert and oriented x4, follows all commands   Resolved Hospital Problem list     Assessment & Plan:  83 year old ESRD on dialysis here with hip fracture whom we are consulting for hypotension.   Acute on chronic hypotension - midodrine to 15 mg q8h - continue levophed through tomorrow  - will transition to comfort after patient is able to see all family on 1/19 - blood cultures with no growth to  date  ESRD on HD MWF - nephro consulted - unable to tolerate HD 1/16 due to hypotension/Afib RVR - plan to transition to comfort based approach and no longer do HD  Displaced right femoral neck fracture s/p hemiarthroplasty on 1/9 - post op care per ortho - increased fentanyl to 25-50 mcg q3h prn  Acute  on chronic anemia of chronic disease - Hb stable - aranesp q friday  T2DM - semglee 20u daily + ssi  Chronic afib - amiodarone 200 mg daily - low dose eliquis 2.5 mg bid  Best Practice (right click and "Reselect all SmartList Selections" daily)   Diet/type: Regular consistency (see orders) DVT prophylaxis DOAC Pressure ulcer(s): identified on: 15/Jan GI prophylaxis: PPI Lines: N/A Foley:  N/A Code Status:  DNR Last date of multidisciplinary goals of care discussion [updated daughter at the bedside 1/18]  Labs   CBC: Recent Labs  Lab 03/06/23 0450 03/07/23 0439 03/09/23 1620 03/09/23 2015 03/10/23 0304 03/11/23 1022  WBC 13.3* 14.8* 13.8*  --  17.6* 16.6*  NEUTROABS  --  12.1*  --   --   --   --   HGB 9.7* 9.0* 9.2* 10.3* 9.7* 9.6*  HCT 30.8* 27.8* 28.7* 32.5* 30.6* 30.8*  MCV 99.4 98.9 100.0  --  100.7* 101.7*  PLT 189 204 274  --  316 326    Basic Metabolic Panel: Recent Labs  Lab 03/07/23 0439 03/09/23 0710 03/10/23 0304 03/11/23 1022  NA 133* 134* 133* 133*  K 5.1 5.1 4.8 5.0  CL 95* 94* 95* 94*  CO2 24 24 24 24   GLUCOSE 164* 124* 158* 138*  BUN 70* 57* 71* 79*  CREATININE 8.19* 6.44* 7.54* 8.39*  CALCIUM 9.0 9.2 8.8* 9.3  MG  --   --  2.2  --   PHOS  --   --   --  5.8*   GFR: Estimated Creatinine Clearance: 5.1 mL/min (A) (by C-G formula based on SCr of 8.39 mg/dL (H)). Recent Labs  Lab 03/07/23 0439 03/09/23 1620 03/10/23 0304 03/11/23 1022  WBC 14.8* 13.8* 17.6* 16.6*    Liver Function Tests: Recent Labs  Lab 03/10/23 0304 03/11/23 1022  AST 25  --   ALT 6  --   ALKPHOS 103  --   BILITOT 0.7  --   PROT 6.0*  --   ALBUMIN 2.5* 2.5*   No results for input(s): "LIPASE", "AMYLASE" in the last 168 hours. No results for input(s): "AMMONIA" in the last 168 hours.  ABG    Component Value Date/Time   PHART 7.343 (L) 02/22/2021 0830   PCO2ART 43.9 02/22/2021 0830   PO2ART 65 (L) 02/22/2021 0830   HCO3 24.0 02/22/2021 0830   TCO2  31 03/03/2023 0740   ACIDBASEDEF 2.0 02/22/2021 0830   O2SAT 92.0 02/22/2021 0830     Coagulation Profile: No results for input(s): "INR", "PROTIME" in the last 168 hours.  Cardiac Enzymes: No results for input(s): "CKTOTAL", "CKMB", "CKMBINDEX", "TROPONINI" in the last 168 hours.  HbA1C: Hgb A1c MFr Bld  Date/Time Value Ref Range Status  03/02/2023 06:34 AM 6.8 (H) 4.8 - 5.6 % Final    Comment:    (NOTE)         Prediabetes: 5.7 - 6.4         Diabetes: >6.4         Glycemic control for adults with diabetes: <7.0   05/19/2022 09:50 PM 7.9 (H) 4.8 - 5.6 % Final    Comment:    (  NOTE)         Prediabetes: 5.7 - 6.4         Diabetes: >6.4         Glycemic control for adults with diabetes: <7.0     CBG: Recent Labs  Lab 03/10/23 2128 03/11/23 0757 03/11/23 1125 03/11/23 1542 03/11/23 2135  GLUCAP 212* 117* 149* 110* 121*    Review of Systems:   pain  Past Medical History:  She,  has a past medical history of Arthritis, Asthma, Atrial fibrillation (HCC), CHF (congestive heart failure) (HCC), CKD (chronic kidney disease), stage III (HCC), COPD (chronic obstructive pulmonary disease) (HCC), Diabetes mellitus, Dialysis patient (HCC), Gout, Heart murmur, History of kidney stones, Hyperlipemia, Hypertension, Psoriasis, Renal disorder, Single kidney, and Sleep apnea.   Surgical History:   Past Surgical History:  Procedure Laterality Date   A/V FISTULAGRAM Left 10/31/2020   Procedure: A/V FISTULAGRAM;  Surgeon: Chuck Hint, MD;  Location: [facility] INVASIVE CV LAB;  Service: Cardiovascular;  Laterality: Left;   A/V FISTULAGRAM N/A 03/04/2023   Procedure: A/V Fistulagram;  Surgeon: Ethelene Hal, MD;  Location: [facility] INVASIVE CV LAB;  Service: Cardiovascular;  Laterality: N/A;   ABDOMINAL HYSTERECTOMY     AV FISTULA PLACEMENT Left 11/17/2018   Procedure: BRACHIO-CEPHALIC ARTERIOVENOUS (AV) FISTULA CREATION IN LEFT ARM;  Surgeon: Cephus Shelling, MD;  Location: [facility] OR;   Service: Vascular;  Laterality: Left;   CARDIOVERSION N/A 03/03/2021   Procedure: CARDIOVERSION;  Surgeon: Laurey Morale, MD;  Location: [facility] ENDOSCOPY;  Service: Cardiovascular;  Laterality: N/A;   CARDIOVERSION N/A 03/12/2021   Procedure: CARDIOVERSION;  Surgeon: Laurey Morale, MD;  Location: [facility] ENDOSCOPY;  Service: Cardiovascular;  Laterality: N/A;   CHOLECYSTECTOMY     HIP ARTHROPLASTY Right 03/03/2023   Procedure: ARTHROPLASTY BIPOLAR HIP (HEMIARTHROPLASTY) POSTERIOR;  Surgeon: Myrene Galas, MD;  Location: [facility] OR;  Service: Orthopedics;  Laterality: Right;   INSERTION OF DIALYSIS CATHETER Right 01/26/2019   Procedure: INSERTION OF DIALYSIS CATHETER, right internal jugular;  Surgeon: Chuck Hint, MD;  Location: [facility] OR;  Service: Vascular;  Laterality: Right;   LEFT AND RIGHT HEART CATHETERIZATION WITH CORONARY ANGIOGRAM N/A 11/29/2013   Procedure: LEFT AND RIGHT HEART CATHETERIZATION WITH CORONARY ANGIOGRAM;  Surgeon: Othella Boyer, MD;  Location: [facility] CATH LAB;  Service: Cardiovascular;  Laterality: N/A;   LEFT HEART CATH AND CORONARY ANGIOGRAPHY N/A 02/24/2021   Procedure: LEFT HEART CATH AND CORONARY ANGIOGRAPHY;  Surgeon: Laurey Morale, MD;  Location: [facility] INVASIVE CV LAB;  Service: Cardiovascular;  Laterality: N/A;   PERIPHERAL VASCULAR INTERVENTION Left 10/31/2020   Procedure: PERIPHERAL VASCULAR INTERVENTION;  Surgeon: Chuck Hint, MD;  Location: [facility] INVASIVE CV LAB;  Service: Cardiovascular;  Laterality: Left;   RIGHT HEART CATH N/A 11/23/2017   Procedure: RIGHT HEART CATH;  Surgeon: Laurey Morale, MD;  Location: [facility] INVASIVE CV LAB;  Service: Cardiovascular;  Laterality: N/A;   RIGHT/LEFT HEART CATH AND CORONARY ANGIOGRAPHY N/A 06/15/2019   Procedure: RIGHT/LEFT HEART CATH AND CORONARY ANGIOGRAPHY;  Surgeon: Laurey Morale, MD;  Location: [facility] INVASIVE CV LAB;  Service: Cardiovascular;  Laterality: N/A;     Social History:   reports that she quit smoking about 14  years ago. Her smoking use included cigarettes. She started smoking about 34 years ago. She has a 20 pack-year smoking history. She has never used smokeless tobacco. She reports current alcohol use. She reports that she does not currently use drugs.   Family History:  Her  family history includes CVA (age of onset: 21) in her father; Cancer (age of onset: 77) in her brother; Cancer (age of onset: 51) in her mother; Lupus in her daughter.   Allergies Allergies  Allergen Reactions   Egg-Derived Products Nausea And Vomiting   Lisinopril Nausea And Vomiting   Penicillins Nausea And Vomiting    Has patient had a PCN reaction causing immediate rash, facial/tongue/throat swelling, SOB or lightheadedness with hypotension: No Has patient had a PCN reaction causing severe rash involving mucus membranes or skin necrosis: No Has patient had a PCN reaction that required hospitalization: No Has patient had a PCN reaction occurring within the last 10 years: No If all of the above answers are "NO", then may proceed with Cephalosporin use.    Tramadol     Hallucinations, mental status changes     Home Medications  Prior to Admission medications   Medication Sig Start Date End Date Taking? Authorizing Provider  acetaminophen (TYLENOL) 500 MG tablet Take 1,000 mg by mouth in the morning and at bedtime.   Yes [provider]  amiodarone (PACERONE) 200 MG tablet Take 200 mg by mouth daily.   Yes [provider]  atorvastatin (LIPITOR) 80 MG tablet Take 80 mg by mouth at bedtime.   Yes [provider]  chlorhexidine (HIBICLENS) 4 % external liquid Apply 15 mLs (1 Application total) topically as directed for 30 doses. Use as directed daily for 5 days every other week for 6 weeks. 03/03/23  Yes Montez Morita, PA-C  Cholecalciferol (VITAMIN D3) 25 MCG (1000 UT) tablet Take 1,000 Units by mouth daily.   Yes [provider]  ELIQUIS 5 MG TABS tablet Take 1 tablet by mouth twice  daily Patient taking differently: Take 2.5 mg by mouth 2 (two) times daily. 07/23/20  Yes Laurey Morale, MD  Insulin Glargine (SEMGLEE [place]) Inject 20 Units into the skin in the morning.   Yes [provider]  lidocaine (LINDAMANTLE) 3 % CREA cream Apply 1 application  topically every Monday, Wednesday, and Friday. Prior to dialysis 03/06/20  Yes [provider]  midodrine (PROAMATINE) 10 MG tablet Take 10 mg by mouth 3 (three) times a week. Taken before dialysis on Monday, Wednesday, Friday Hold if SBP>130   Yes [provider]  mupirocin ointment (BACTROBAN) 2 % Place 1 Application into the nose 2 (two) times daily for 60 doses. Use as directed 2 times daily for 5 days every other week for 6 weeks. 03/03/23 04/02/23 Yes Montez Morita, PA-C  pantoprazole (PROTONIX) 40 MG tablet Take 1 tablet (40 mg total) by mouth 2 (two) times daily. 05/20/22  Yes Tyrone Nine, MD  Pramoxine-Calamine (AVEENO ANTI-ITCH EX) Apply 1 application  topically every 6 (six) hours as needed (itching).   Yes [provider]  nitroGLYCERIN (NITROSTAT) 0.4 MG SL tablet Place 0.4 mg under the tongue every 5 (five) minutes as needed for chest pain. Patient not taking: Reported on 03/02/2023    [provider]     Critical care time: 26 min

## 2023-03-12 NOTE — Progress Notes (Signed)
eLink Physician-Brief Progress Note Patient Name: Stephanie Woodward DOB: 1940/05/11 MRN: [id number]   Date of Service  03/12/2023  HPI/Events of Note  Insomnia/anxiety  eICU Interventions      Received fentanyl and Melatonin without success Will give Klonopin    Nikolay Demetriou 03/12/2023, 1:57 AM

## 2023-03-12 NOTE — Progress Notes (Addendum)
Pharmacy ICU Bowel Regimen Consult Note   Current Inpatient Medications for Bowel Management:  Docusate 100 BID Miralax 17 BID  Assessment: Stephanie Woodward is a 83 y.o. year old female admitted on 03/02/2023. Constipation identified as non-opioid-induced constipation . Bowel regimen assessment completed by Gerilyn Nestle, RN on 03/12/23. LBM on 03/06/23.  [x]  Bowel sounds present  [x]  No abdominal tenderness  []  Passing gas   Plan: *Refusing medications - no further changes. Considering comfort focus.  Pharmacy discontinuing protocol.  MD contacted (if needed):n/a  Thank you for allowing pharmacy to participate in this patient's care.  Link Snuffer, PharmD, BCPS, BCCCP Please refer to [facility] for [facility] numbers 03/12/2023 2:14 PM

## 2023-03-13 DIAGNOSIS — I959 Hypotension, unspecified: Secondary | ICD-10-CM | POA: Diagnosis not present

## 2023-03-13 MED ORDER — GLYCOPYRROLATE 0.2 MG/ML IJ SOLN: 0.2000 mg | INTRAMUSCULAR | Status: DC | PRN

## 2023-03-13 MED ORDER — LORAZEPAM 2 MG/ML PO CONC: 1.0000 mg | ORAL | Status: DC | PRN

## 2023-03-13 MED ORDER — GLYCOPYRROLATE 1 MG PO TABS: 1.0000 mg | ORAL_TABLET | ORAL | Status: DC | PRN

## 2023-03-13 MED ORDER — LORAZEPAM 2 MG/ML IJ SOLN: 1.0000 mg | INTRAMUSCULAR | Status: DC | PRN

## 2023-03-13 MED ORDER — LORAZEPAM 1 MG PO TABS: 1.0000 mg | ORAL_TABLET | ORAL | Status: DC | PRN

## 2023-03-14 LAB — CULTURE, BLOOD (ROUTINE X 2)
Culture: NO GROWTH
Culture: NO GROWTH

## 2023-03-26 NOTE — Progress Notes (Signed)
Late Note Entry- Mar 14, 2023  Contacted [facility] this morning to advise clinic of pt's passing.   Olivia Canter Renal Navigator [phone]

## 2023-03-26 NOTE — Progress Notes (Signed)
Patient transitioning to comfort care. Family at bedside, chaplin present for emotional support. Levophed stopped. Patient appears comfortable at this time, PAINAID score 0.

## 2023-03-26 NOTE — Progress Notes (Signed)
Palliative Medicine Progress Note   Patient Name: Stephanie Woodward       Date:  DOB: 29-Jul-1940  Age: 83 y.o. MRN#: [id number] Attending Physician: Lynnell Catalan, MD Primary Care Physician: [facility] Admit Date: 03/02/2023   HPI/Patient Profile: 83 y.o. female  with past medical history of ESRD on HD (MWF), cognitive impairment/dementia, DM 2, chronic A-fib on Eliquis, HTN, HLD, COPD, OSA, and obesity who presented to the ED with complaints of right hip pain after a fall on 03/02/2023.  She was found to have a closed displaced right femoral neck fracture and Ortho was consulted.  Patient underwent a right hip hemiarthroplasty on 1/9 by Dr. Carola Frost.  On 1/15, patient developed developed worsening hypotension on top of chronic hypotension (on midodrine). She did not tolerate dialysis on 1/16.    Palliative Medicine was consulted for goals of care and medical decision making.  Subjective: Chart reviewed. Updates received from Dr. Ned Card and RN. Patient is still having pain in her low back/sacrum; prn fentanyl is providing only partial relief. Patient became hypotensive when attempting to wean down norepinephrine earlier today.  On my assessment, patient is sleeping and I did not attempt to wake her.  I spoke with both daughters - [name] (by phone) and Judeth Cornfield (in-person). There are multiple family members visiting both today and tomorrow. Plan to continue norepinephrine until family is ready. They understand patient may not survive long after norepinephrine is turned off. Emotional support provided.   Objective:  Physical Exam Vitals reviewed.  Constitutional:      General: She is sleeping. She is not in acute distress.    Appearance: She is  ill-appearing.  Pulmonary:     Effort: Pulmonary effort is normal.             Palliative Medicine Assessment & Plan   Assessment: Principal Problem:   Closed displaced fracture of right femoral neck (HCC) Active Problems:   Type 2 diabetes mellitus, controlled, with renal complications (HCC)   Chronic combined systolic and diastolic heart failure (HCC)   ESRD on hemodialysis (HCC)   Hypotension   Paroxysmal atrial fibrillation (HCC)   Dementia without behavioral disturbance (HCC)    Recommendations/Plan: Continue norepinephrine  No further dialysis No further labs, minimize medications Continue to allow unrestricted visitation Dilaudid 0.5-1 mg  IV every 2 hours as needed for pain Plan for transition to comfort care likely tomorrow after family has visited PMT will continue to follow   Code Status: DNR/DNI   Prognosis:  Hours - Days  Discharge Planning: Anticipated Hospital Death   Thank you for allowing the Palliative Medicine Team to assist in the care of this patient.   Time: 35 minutes   Merry Proud, NP   Please contact Palliative Medicine Team phone at [phone] for questions and concerns.  For individual providers, please see AMION.

## 2023-03-26 NOTE — Progress Notes (Signed)
NAME:  Stephanie Woodward, MRN:  [id number], DOB:  Aug 25, 1940, LOS: 11 ADMISSION DATE:  03/02/2023, CONSULTATION DATE:  1/15 REFERRING MD:  [name] Seller CHIEF COMPLAINT:  hip pain   History of Present Illness:  Pt is an 83 yr old female with significant pmhx of cognitive impairment/dementia, ESRD (MWF), DM2, HTN, HLD, CHF, and chronic afib on eliquis, COPD, OSA, and obesity who presented to ED with complaints of right hip pain after a fall on 1/8. Per ED work up, a closed displaced right femoral neck fracture was found and Ortho was consulted. Patient underwent a right hip hemiarthroplasty on 1/9 by Dr. Carola Frost. On 1/15 patient progressive had worsening hypotension on top of chronic hypotension (receiving midodrine) and was given 500 cc fluid bolus challenge but hypotension remaining refractory. Due to ongoing hypotension and increase in tachycardia compared to baseline, PCCM was consulted to assist in management for hypotension.    Upon assessment at beside, patient was alert and oriented x 4 and able to answer questions appropriate despite hx of cognitive impairment. Discussed with patient the possible need of vasopressor support due to ongoing hypotension. Also, had GOC with patient and daughters at beside. Discussed extensively with patient and daughters against CPR, defibrillation, and mechanical ventilation in the event of cardiopulmonary arrest. We would continue full care but in the event of cardiac and or respiratory arrest we would not perform interventions just mentioned. Patient expressed she would not want to suffer if did have a cardiac or respiratory arrest and would want to be made DNR/DNI. This was witnessed by daughters at beside, along with PCCM MD [place].   Pertinent  Medical History  Dementia ESRD on HD MWF T2DM HTN HLD CHF Chronic afib on eliquis COPD/OSA  Significant Hospital Events: Including procedures, antibiotic start and stop dates in addition to other pertinent events   1/8  Mechanical fall, admit with right hip femoral neck fracture 1/9 Hemiarthroplasty of right hip fracture  1/15 PCCM consult due to ongoing hypotension; started on low dose peripheral levophed 1/16 unable to tolerate HD(even without UF) due to hypotension/unresponsive episode 1/17 decision made to no longer do HD 1/19 transitioning to comfort care once all family arrive  Interim History / Subjective:  Seen with daughter at the bedside. Resting comfortably.  Objective   Blood pressure (!) 102/56, pulse (!) 108, temperature (!) 97.2 F (36.2 C), temperature source Axillary, resp. rate 12, height 5\' 2"  (1.575 m), weight 75.8 kg, SpO2 98%.        Intake/Output Summary (Last 24 hours) at  0617 Last data filed at  0200 Gross per 24 hour  Intake 242.03 ml  Output --  Net 242.03 ml   Filed Weights   03/03/23 0656 03/07/23 0927 03/07/23 1321  Weight: 76 kg 77.8 kg 75.8 kg    Examination: General: chronically ill appearing, elderly female  Lungs: breathing comfortably on room air Cardiovascular: regular rate, irregular rhythm  Abdomen: soft, nontender Extremities: LUE AVF with palpable thrill Neuro: asleep, awakes to voice.    Resolved Hospital Problem list     Assessment & Plan:   Goals of care/Comfort care Acute on chronic hypotension ESRD Chronic afib T2DM Anemia of chronic disease - unable to tolerate HD 1/16 due to hypotension/Afib RVR, decision made to discontinue HD - continue levophed and midodrine until family has come to visit; then will discontinue - will transition to comfort after patient is able to see all family - fentanyl 25-50 mcg q3h PRN - Dilaudid 0.5-1 mg q2h  PRN  - Discontinued multivitamin, aranesp   Best Practice (right click and "Reselect all SmartList Selections" daily)   Diet/type: Regular consistency (see orders) DVT prophylaxis DOAC Pressure ulcer(s): identified on: 15/Jan GI prophylaxis: PPI Lines: N/A Foley:  N/A Code  Status:  DNR Last date of multidisciplinary goals of care discussion [updated daughters at the bedside 1/19 ]  Labs   CBC: Recent Labs  Lab 03/07/23 0439 03/09/23 1620 03/09/23 2015 03/10/23 0304 03/11/23 1022  WBC 14.8* 13.8*  --  17.6* 16.6*  NEUTROABS 12.1*  --   --   --   --   HGB 9.0* 9.2* 10.3* 9.7* 9.6*  HCT 27.8* 28.7* 32.5* 30.6* 30.8*  MCV 98.9 100.0  --  100.7* 101.7*  PLT 204 274  --  316 326    Basic Metabolic Panel: Recent Labs  Lab 03/07/23 0439 03/09/23 0710 03/10/23 0304 03/11/23 1022  NA 133* 134* 133* 133*  K 5.1 5.1 4.8 5.0  CL 95* 94* 95* 94*  CO2 24 24 24 24   GLUCOSE 164* 124* 158* 138*  BUN 70* 57* 71* 79*  CREATININE 8.19* 6.44* 7.54* 8.39*  CALCIUM 9.0 9.2 8.8* 9.3  MG  --   --  2.2  --   PHOS  --   --   --  5.8*   GFR: Estimated Creatinine Clearance: 5.1 mL/min (A) (by C-G formula based on SCr of 8.39 mg/dL (H)). Recent Labs  Lab 03/07/23 0439 03/09/23 1620 03/10/23 0304 03/11/23 1022  WBC 14.8* 13.8* 17.6* 16.6*    Liver Function Tests: Recent Labs  Lab 03/10/23 0304 03/11/23 1022  AST 25  --   ALT 6  --   ALKPHOS 103  --   BILITOT 0.7  --   PROT 6.0*  --   ALBUMIN 2.5* 2.5*   No results for input(s): "LIPASE", "AMYLASE" in the last 168 hours. No results for input(s): "AMMONIA" in the last 168 hours.  ABG    Component Value Date/Time   PHART 7.343 (L) 02/22/2021 0830   PCO2ART 43.9 02/22/2021 0830   PO2ART 65 (L) 02/22/2021 0830   HCO3 24.0 02/22/2021 0830   TCO2 31 03/03/2023 0740   ACIDBASEDEF 2.0 02/22/2021 0830   O2SAT 92.0 02/22/2021 0830     Coagulation Profile: No results for input(s): "INR", "PROTIME" in the last 168 hours.  Cardiac Enzymes: No results for input(s): "CKTOTAL", "CKMB", "CKMBINDEX", "TROPONINI" in the last 168 hours.  HbA1C: Hgb A1c MFr Bld  Date/Time Value Ref Range Status  03/02/2023 06:34 AM 6.8 (H) 4.8 - 5.6 % Final    Comment:    (NOTE)         Prediabetes: 5.7 - 6.4          Diabetes: >6.4         Glycemic control for adults with diabetes: <7.0   05/19/2022 09:50 PM 7.9 (H) 4.8 - 5.6 % Final    Comment:    (NOTE)         Prediabetes: 5.7 - 6.4         Diabetes: >6.4         Glycemic control for adults with diabetes: <7.0     CBG: Recent Labs  Lab 03/11/23 1125 03/11/23 1542 03/11/23 2135 03/12/23 0742 03/12/23 2137  GLUCAP 149* 110* 121* 79 78    Review of Systems:   pain  Past Medical History:  She,  has a past medical history of Arthritis, Asthma, Atrial fibrillation (HCC), CHF (congestive  heart failure) (HCC), CKD (chronic kidney disease), stage III (HCC), COPD (chronic obstructive pulmonary disease) (HCC), Diabetes mellitus, Dialysis patient (HCC), Gout, Heart murmur, History of kidney stones, Hyperlipemia, Hypertension, Psoriasis, Renal disorder, Single kidney, and Sleep apnea.   Surgical History:   Past Surgical History:  Procedure Laterality Date   A/V FISTULAGRAM Left 10/31/2020   Procedure: A/V FISTULAGRAM;  Surgeon: Chuck Hint, MD;  Location: [facility] INVASIVE CV LAB;  Service: Cardiovascular;  Laterality: Left;   A/V FISTULAGRAM N/A 03/04/2023   Procedure: A/V Fistulagram;  Surgeon: Ethelene Hal, MD;  Location: [facility] INVASIVE CV LAB;  Service: Cardiovascular;  Laterality: N/A;   ABDOMINAL HYSTERECTOMY     AV FISTULA PLACEMENT Left 11/17/2018   Procedure: BRACHIO-CEPHALIC ARTERIOVENOUS (AV) FISTULA CREATION IN LEFT ARM;  Surgeon: Cephus Shelling, MD;  Location: [facility] OR;  Service: Vascular;  Laterality: Left;   CARDIOVERSION N/A 03/03/2021   Procedure: CARDIOVERSION;  Surgeon: Laurey Morale, MD;  Location: [facility] ENDOSCOPY;  Service: Cardiovascular;  Laterality: N/A;   CARDIOVERSION N/A 03/12/2021   Procedure: CARDIOVERSION;  Surgeon: Laurey Morale, MD;  Location: [facility] ENDOSCOPY;  Service: Cardiovascular;  Laterality: N/A;   CHOLECYSTECTOMY     HIP ARTHROPLASTY Right 03/03/2023   Procedure: ARTHROPLASTY BIPOLAR HIP (HEMIARTHROPLASTY)  POSTERIOR;  Surgeon: Myrene Galas, MD;  Location: [facility] OR;  Service: Orthopedics;  Laterality: Right;   INSERTION OF DIALYSIS CATHETER Right 01/26/2019   Procedure: INSERTION OF DIALYSIS CATHETER, right internal jugular;  Surgeon: Chuck Hint, MD;  Location: [facility] OR;  Service: Vascular;  Laterality: Right;   LEFT AND RIGHT HEART CATHETERIZATION WITH CORONARY ANGIOGRAM N/A 11/29/2013   Procedure: LEFT AND RIGHT HEART CATHETERIZATION WITH CORONARY ANGIOGRAM;  Surgeon: Othella Boyer, MD;  Location: [facility] CATH LAB;  Service: Cardiovascular;  Laterality: N/A;   LEFT HEART CATH AND CORONARY ANGIOGRAPHY N/A 02/24/2021   Procedure: LEFT HEART CATH AND CORONARY ANGIOGRAPHY;  Surgeon: Laurey Morale, MD;  Location: [facility] INVASIVE CV LAB;  Service: Cardiovascular;  Laterality: N/A;   PERIPHERAL VASCULAR INTERVENTION Left 10/31/2020   Procedure: PERIPHERAL VASCULAR INTERVENTION;  Surgeon: Chuck Hint, MD;  Location: [facility] INVASIVE CV LAB;  Service: Cardiovascular;  Laterality: Left;   RIGHT HEART CATH N/A 11/23/2017   Procedure: RIGHT HEART CATH;  Surgeon: Laurey Morale, MD;  Location: [facility] INVASIVE CV LAB;  Service: Cardiovascular;  Laterality: N/A;   RIGHT/LEFT HEART CATH AND CORONARY ANGIOGRAPHY N/A 06/15/2019   Procedure: RIGHT/LEFT HEART CATH AND CORONARY ANGIOGRAPHY;  Surgeon: Laurey Morale, MD;  Location: [facility] INVASIVE CV LAB;  Service: Cardiovascular;  Laterality: N/A;     Social History:   reports that she quit smoking about 14 years ago. Her smoking use included cigarettes. She started smoking about 34 years ago. She has a 20 pack-year smoking history. She has never used smokeless tobacco. She reports current alcohol use. She reports that she does not currently use drugs.   Family History:  Her family history includes CVA (age of onset: 33) in her father; Cancer (age of onset: 43) in her brother; Cancer (age of onset: 58) in her mother; Lupus in her daughter.   Allergies Allergies   Allergen Reactions   Egg-Derived Products Nausea And Vomiting   Lisinopril Nausea And Vomiting   Penicillins Nausea And Vomiting    Has patient had a PCN reaction causing immediate rash, facial/tongue/throat swelling, SOB or lightheadedness with hypotension: No Has patient had a PCN reaction causing severe rash involving mucus membranes or skin necrosis: No  Has patient had a PCN reaction that required hospitalization: No Has patient had a PCN reaction occurring within the last 10 years: No If all of the above answers are "NO", then may proceed with Cephalosporin use.    Tramadol     Hallucinations, mental status changes     Home Medications  Prior to Admission medications   Medication Sig Start Date End Date Taking? Authorizing Provider  acetaminophen (TYLENOL) 500 MG tablet Take 1,000 mg by mouth in the morning and at bedtime.   Yes [provider]  amiodarone (PACERONE) 200 MG tablet Take 200 mg by mouth daily.   Yes [provider]  atorvastatin (LIPITOR) 80 MG tablet Take 80 mg by mouth at bedtime.   Yes [provider]  chlorhexidine (HIBICLENS) 4 % external liquid Apply 15 mLs (1 Application total) topically as directed for 30 doses. Use as directed daily for 5 days every other week for 6 weeks. 03/03/23  Yes Montez Morita, PA-C  Cholecalciferol (VITAMIN D3) 25 MCG (1000 UT) tablet Take 1,000 Units by mouth daily.   Yes [provider]  ELIQUIS 5 MG TABS tablet Take 1 tablet by mouth twice daily Patient taking differently: Take 2.5 mg by mouth 2 (two) times daily. 07/23/20  Yes Laurey Morale, MD  Insulin Glargine (SEMGLEE [place]) Inject 20 Units into the skin in the morning.   Yes [provider]  lidocaine (LINDAMANTLE) 3 % CREA cream Apply 1 application  topically every Monday, Wednesday, and Friday. Prior to dialysis 03/06/20  Yes [provider]  midodrine (PROAMATINE) 10 MG tablet Take 10 mg by mouth 3 (three) times a week. Taken  before dialysis on Monday, Wednesday, Friday Hold if SBP>130   Yes [provider]  mupirocin ointment (BACTROBAN) 2 % Place 1 Application into the nose 2 (two) times daily for 60 doses. Use as directed 2 times daily for 5 days every other week for 6 weeks. 03/03/23 04/02/23 Yes Montez Morita, PA-C  pantoprazole (PROTONIX) 40 MG tablet Take 1 tablet (40 mg total) by mouth 2 (two) times daily. 05/20/22  Yes Tyrone Nine, MD  Pramoxine-Calamine (AVEENO ANTI-ITCH EX) Apply 1 application  topically every 6 (six) hours as needed (itching).   Yes [provider]  nitroGLYCERIN (NITROSTAT) 0.4 MG SL tablet Place 0.4 mg under the tongue every 5 (five) minutes as needed for chest pain. Patient not taking: Reported on 03/02/2023    [provider]     Critical care time: 22 min

## 2023-03-26 NOTE — Progress Notes (Signed)
Family declines blood glucose checks and turns at this time. Patient to transition to comfort care after the rest of the family arrives.

## 2023-03-26 NOTE — Progress Notes (Signed)
Chaplain responds to nurse's page requesting routine support as pt transitions to comfort care. Pt's two daughters and two granddaughters are at bedside and request prayer and presence. Chaplain provides both and returns to check on them after attending to another call. Family denies needs.

## 2023-03-26 NOTE — Death Summary Note (Signed)
  Name: Stephanie Woodward MRN: [id number] DOB: September 25, 1940 83 y.o.  Date of Admission: 03/02/2023  1:25 AM Date of Discharge:  Attending Physician: Lynnell Catalan, MD  Discharge Diagnosis: Principal Problem:   Closed displaced fracture of right femoral neck (HCC) Active Problems:   Type 2 diabetes mellitus, controlled, with renal complications (HCC)   Chronic combined systolic and diastolic heart failure (HCC)   ESRD on hemodialysis (HCC)   Hypotension   Paroxysmal atrial fibrillation (HCC)   Dementia without behavioral disturbance (HCC)   Cause of death: ESRD, severe hypotension  Time of death: 1452  Disposition and follow-up:   [name] was discharged from [facility] in expired condition.    Hospital Course: Stephanie Woodward is an 83 yo female with significant pmhx of cognitive impairment/dementia, ESRD (MWF), DM2, HTN, HLD, CHF, and chronic afib on eliquis, COPD, OSA, and obesity who presented after a fall complaining of right hip pain. Found to have a a closed displaced right femoral neck fracture and patient underwent a right hip hemiarthroplasty on 1/9. On 1/15 patient progressive had worsening hypotension on top of chronic hypotension (receiving midodrine already) and was given 500 cc fluid bolus challenge but hypotension remained refractory. She was started on levophed and transferred to ICU. Patient remained hypotensive despite escalating doses of midodrine. She also was not able to tolerate HD without becoming more hypotensive and into afib with rvr. The decision was made between the patient and her daughters, alongside of PCCM, nephro, and palliative care to ultimately discontinue dialysis, as she was not able to tolerate it any longer. She was transitioned to comfort care on 1/19 after being visited by friends and family and passed away at 1452.  Signed: Chauncey Mann, DO , 3:13 PM Critical care resident, PGY-3

## 2023-03-26 DEATH — deceased

## 2024-01-17 ENCOUNTER — Ambulatory Visit: Payer: Medicare (Managed Care) | Admitting: Neurology
